# Patient Record
Sex: Female | Born: 1945
Health system: Southern US, Community
[De-identification: ages and names within clinical notes are randomized; demographics above are authoritative.]

## PROBLEM LIST (undated history)

## (undated) DIAGNOSIS — K219 Gastro-esophageal reflux disease without esophagitis: Secondary | ICD-10-CM

## (undated) DIAGNOSIS — M545 Low back pain, unspecified: Secondary | ICD-10-CM

## (undated) DIAGNOSIS — S92909A Unspecified fracture of unspecified foot, initial encounter for closed fracture: Secondary | ICD-10-CM

## (undated) DIAGNOSIS — M1990 Unspecified osteoarthritis, unspecified site: Secondary | ICD-10-CM

## (undated) DIAGNOSIS — H269 Unspecified cataract: Secondary | ICD-10-CM

## (undated) DIAGNOSIS — M8580 Other specified disorders of bone density and structure, unspecified site: Secondary | ICD-10-CM

## (undated) DIAGNOSIS — J449 Chronic obstructive pulmonary disease, unspecified: Secondary | ICD-10-CM

## (undated) DIAGNOSIS — G709 Myoneural disorder, unspecified: Secondary | ICD-10-CM

## (undated) DIAGNOSIS — G4730 Sleep apnea, unspecified: Secondary | ICD-10-CM

## (undated) DIAGNOSIS — N189 Chronic kidney disease, unspecified: Secondary | ICD-10-CM

## (undated) DIAGNOSIS — I6381 Other cerebral infarction due to occlusion or stenosis of small artery: Secondary | ICD-10-CM

## (undated) DIAGNOSIS — I10 Essential (primary) hypertension: Secondary | ICD-10-CM

## (undated) DIAGNOSIS — R32 Unspecified urinary incontinence: Secondary | ICD-10-CM

## (undated) DIAGNOSIS — D649 Anemia, unspecified: Secondary | ICD-10-CM

## (undated) DIAGNOSIS — M79606 Pain in leg, unspecified: Secondary | ICD-10-CM

## (undated) DIAGNOSIS — I639 Cerebral infarction, unspecified: Secondary | ICD-10-CM

## (undated) DIAGNOSIS — R112 Nausea with vomiting, unspecified: Secondary | ICD-10-CM

## (undated) DIAGNOSIS — E282 Polycystic ovarian syndrome: Secondary | ICD-10-CM

## (undated) DIAGNOSIS — E785 Hyperlipidemia, unspecified: Secondary | ICD-10-CM

## (undated) DIAGNOSIS — Z9289 Personal history of other medical treatment: Secondary | ICD-10-CM

## (undated) DIAGNOSIS — I951 Orthostatic hypotension: Secondary | ICD-10-CM

## (undated) DIAGNOSIS — Z87891 Personal history of nicotine dependence: Principal | ICD-10-CM

## (undated) DIAGNOSIS — Z9889 Other specified postprocedural states: Secondary | ICD-10-CM

## (undated) HISTORY — PX: OOPHORECTOMY: SHX86

## (undated) HISTORY — PX: KNEE SURGERY: SHX244

## (undated) HISTORY — DX: Other cerebral infarction due to occlusion or stenosis of small artery: I63.81

## (undated) HISTORY — PX: CARDIAC CATHETERIZATION: SHX172

## (undated) HISTORY — PX: FOOT SURGERY: SHX648

## (undated) HISTORY — DX: Other specified disorders of bone density and structure, unspecified site: M85.80

## (undated) HISTORY — DX: Essential (primary) hypertension: I10

## (undated) HISTORY — DX: Hyperlipidemia, unspecified: E78.5

## (undated) HISTORY — PX: CARPAL TUNNEL RELEASE: SHX101

## (undated) HISTORY — PX: ELBOW SURGERY: SHX618

## (undated) HISTORY — DX: Unspecified cataract: H26.9

## (undated) HISTORY — DX: Personal history of other medical treatment: Z92.89

## (undated) HISTORY — DX: Polycystic ovarian syndrome: E28.2

## (undated) HISTORY — PX: HAND SURGERY: SHX662

## (undated) HISTORY — PX: OTHER SURGICAL HISTORY: SHX169

## (undated) HISTORY — DX: Personal history of nicotine dependence: Z87.891

## (undated) HISTORY — DX: Unspecified fracture of unspecified foot, initial encounter for closed fracture: S92.909A

## (undated) HISTORY — PX: CATARACT EXTRACTION: SUR2

## (undated) HISTORY — PX: SHOULDER SURGERY: SHX246

---

## 1977-06-17 HISTORY — PX: VAGINAL HYSTERECTOMY: SUR661

## 1999-09-03 ENCOUNTER — Other Ambulatory Visit: Admission: RE | Admit: 1999-09-03 | Discharge: 1999-09-03 | Payer: Self-pay | Admitting: Obstetrics and Gynecology

## 2000-05-16 ENCOUNTER — Encounter (INDEPENDENT_AMBULATORY_CARE_PROVIDER_SITE_OTHER): Payer: Self-pay | Admitting: *Deleted

## 2000-05-16 ENCOUNTER — Ambulatory Visit (HOSPITAL_COMMUNITY): Admission: RE | Admit: 2000-05-16 | Discharge: 2000-05-16 | Payer: Self-pay | Admitting: Gastroenterology

## 2000-08-05 ENCOUNTER — Encounter: Payer: Self-pay | Admitting: Specialist

## 2000-08-05 ENCOUNTER — Ambulatory Visit (HOSPITAL_COMMUNITY): Admission: RE | Admit: 2000-08-05 | Discharge: 2000-08-05 | Payer: Self-pay | Admitting: *Deleted

## 2000-08-19 ENCOUNTER — Other Ambulatory Visit: Admission: RE | Admit: 2000-08-19 | Discharge: 2000-08-19 | Payer: Self-pay | Admitting: Obstetrics and Gynecology

## 2001-09-23 ENCOUNTER — Other Ambulatory Visit: Admission: RE | Admit: 2001-09-23 | Discharge: 2001-09-23 | Payer: Self-pay | Admitting: Obstetrics and Gynecology

## 2003-03-29 ENCOUNTER — Encounter (HOSPITAL_BASED_OUTPATIENT_CLINIC_OR_DEPARTMENT_OTHER): Admission: RE | Admit: 2003-03-29 | Discharge: 2003-06-27 | Payer: Self-pay | Admitting: Internal Medicine

## 2003-03-30 ENCOUNTER — Other Ambulatory Visit: Admission: RE | Admit: 2003-03-30 | Discharge: 2003-03-30 | Payer: Self-pay | Admitting: Obstetrics and Gynecology

## 2003-03-31 ENCOUNTER — Encounter: Admission: RE | Admit: 2003-03-31 | Discharge: 2003-03-31 | Payer: Self-pay | Admitting: Internal Medicine

## 2003-03-31 ENCOUNTER — Encounter (HOSPITAL_BASED_OUTPATIENT_CLINIC_OR_DEPARTMENT_OTHER): Payer: Self-pay | Admitting: Internal Medicine

## 2004-04-02 ENCOUNTER — Other Ambulatory Visit: Admission: RE | Admit: 2004-04-02 | Discharge: 2004-04-02 | Payer: Self-pay | Admitting: Obstetrics and Gynecology

## 2004-06-05 ENCOUNTER — Encounter: Payer: Self-pay | Admitting: Specialist

## 2004-06-17 ENCOUNTER — Encounter: Payer: Self-pay | Admitting: Specialist

## 2004-11-01 ENCOUNTER — Encounter (INDEPENDENT_AMBULATORY_CARE_PROVIDER_SITE_OTHER): Payer: Self-pay | Admitting: Specialist

## 2004-11-01 ENCOUNTER — Ambulatory Visit (HOSPITAL_COMMUNITY): Admission: RE | Admit: 2004-11-01 | Discharge: 2004-11-01 | Payer: Self-pay | Admitting: Gastroenterology

## 2005-04-04 ENCOUNTER — Other Ambulatory Visit: Admission: RE | Admit: 2005-04-04 | Discharge: 2005-04-04 | Payer: Self-pay | Admitting: Obstetrics and Gynecology

## 2005-09-17 ENCOUNTER — Emergency Department: Payer: Self-pay | Admitting: Emergency Medicine

## 2006-03-11 ENCOUNTER — Ambulatory Visit (HOSPITAL_COMMUNITY): Admission: RE | Admit: 2006-03-11 | Discharge: 2006-03-11 | Payer: Self-pay | Admitting: Endocrinology

## 2006-04-08 ENCOUNTER — Other Ambulatory Visit: Admission: RE | Admit: 2006-04-08 | Discharge: 2006-04-08 | Payer: Self-pay | Admitting: Obstetrics and Gynecology

## 2006-05-13 ENCOUNTER — Ambulatory Visit: Payer: Self-pay | Admitting: Vascular Surgery

## 2006-07-24 ENCOUNTER — Ambulatory Visit: Payer: Self-pay | Admitting: Internal Medicine

## 2006-09-04 ENCOUNTER — Ambulatory Visit: Payer: Self-pay | Admitting: Internal Medicine

## 2007-11-02 ENCOUNTER — Encounter: Admission: RE | Admit: 2007-11-02 | Discharge: 2008-01-31 | Payer: Self-pay | Admitting: Endocrinology

## 2008-03-21 ENCOUNTER — Ambulatory Visit: Payer: Self-pay | Admitting: Obstetrics and Gynecology

## 2008-03-21 ENCOUNTER — Other Ambulatory Visit: Admission: RE | Admit: 2008-03-21 | Discharge: 2008-03-21 | Payer: Self-pay | Admitting: Obstetrics and Gynecology

## 2008-03-21 ENCOUNTER — Encounter: Payer: Self-pay | Admitting: Obstetrics and Gynecology

## 2008-04-05 ENCOUNTER — Ambulatory Visit (HOSPITAL_COMMUNITY): Admission: RE | Admit: 2008-04-05 | Discharge: 2008-04-06 | Payer: Self-pay | Admitting: Orthopedic Surgery

## 2008-06-08 ENCOUNTER — Ambulatory Visit: Payer: Self-pay | Admitting: Obstetrics and Gynecology

## 2008-06-15 ENCOUNTER — Ambulatory Visit: Payer: Self-pay | Admitting: Obstetrics and Gynecology

## 2009-08-22 ENCOUNTER — Ambulatory Visit: Payer: Self-pay | Admitting: Obstetrics and Gynecology

## 2009-08-22 ENCOUNTER — Other Ambulatory Visit: Admission: RE | Admit: 2009-08-22 | Discharge: 2009-08-22 | Payer: Self-pay | Admitting: Obstetrics and Gynecology

## 2010-07-04 ENCOUNTER — Ambulatory Visit: Payer: Self-pay | Admitting: Obstetrics and Gynecology

## 2010-07-07 ENCOUNTER — Encounter: Payer: Self-pay | Admitting: Specialist

## 2010-08-06 ENCOUNTER — Ambulatory Visit (INDEPENDENT_AMBULATORY_CARE_PROVIDER_SITE_OTHER): Payer: 59 | Admitting: Obstetrics and Gynecology

## 2010-08-06 DIAGNOSIS — E559 Vitamin D deficiency, unspecified: Secondary | ICD-10-CM

## 2010-08-06 DIAGNOSIS — M810 Age-related osteoporosis without current pathological fracture: Secondary | ICD-10-CM

## 2010-08-20 ENCOUNTER — Ambulatory Visit: Payer: 59

## 2010-08-21 ENCOUNTER — Ambulatory Visit (INDEPENDENT_AMBULATORY_CARE_PROVIDER_SITE_OTHER): Payer: 59

## 2010-08-21 DIAGNOSIS — M810 Age-related osteoporosis without current pathological fracture: Secondary | ICD-10-CM

## 2010-10-30 NOTE — Op Note (Signed)
NAME:  Mckenzie Lawrence, Mckenzie Lawrence                ACCOUNT NO.:  [id number]   MEDICAL RECORD NO.:  [id number]          PATIENT TYPE:  AMB   LOCATION:  DAY                          FACILITY:  [facility]   PHYSICIAN:  Tarri Glenn, M.D.  DATE OF BIRTH:  [birth year]   DATE OF PROCEDURE:  04/05/2008  DATE OF DISCHARGE:                               OPERATIVE REPORT   PREOPERATIVE DIAGNOSES:  1. Chronic left ring trigger finger.  2. Claw toe deformities, right second, third and fourth toes.   POSTOPERATIVE DIAGNOSES:  1. Chronic left ring trigger finger.  2. Claw toe deformities, right second, third and fourth toes.   OPERATION:  1. Release left ring trigger finger.  2. Correction of claw toe deformities, right second, third and fourth      toes with transfer of the long flexor tendon to the extensor      mechanism.   SURGEON:  Tarri Glenn, M.D.   ASSISTANTElodia Florence. Vanita Ingles.   ANESTHESIA:  General.   INDICATIONS FOR PROCEDURE:  She is an insulin-dependent diabetic who is  very prone to infection.  She did not wish to have any pins put in her  toes to correct the claw toe deformities which would be the more usual  procedure for someone her age, but fortunately her toe deformities were  flexible and I felt that with her history of infections that this would  be the prudent way to manage things without any pins in place.  She also  at one point had desired to have Dupuytren nodule removed from her left  palm, but at the time of surgery today elected not to have this done.  She has also had an injection of her left ring trigger finger, but it is  still symptomatic.   PROCEDURE:  Prophylactic antibiotics, satisfied general anesthesia, we  first performed the left upper extremity surgery.  Pneumatic tourniquet  applied and left arm was Esmarch out non-sterilely and prepped from  midforearm to fingertips and draped in sterile field.  Time-out  performed.  A transverse incision was made  midway between the distal  palmar crease and the MP joint crease.  With retraction by Mr. Delorse Lek  the neurovascular bundles were retracted to either side and the tendon  identified and the tendon sheath opened superficially with a 15 knife  blade.  With small scissors and direct visualization I then released the  tendon sheath as far proximally as there was any compression and  distally I slipped the A1 pulley for 5 mm slightly more resecting a  small portion of the pulley and released the pulley until there was no  longer any compression on the flexor tendons.  The wound was irrigated  then with sterile saline.  Subcutaneous tissue was closed with 4-0  Vicryl and the skin with interrupted 4-0 nylon.  Betadine and Adaptic  dressing were applied.  Tourniquet was released.  We then separately re-  prepped and re-draped the right lower extremity first applying pneumatic  tourniquet and the leg Esmarch out non-sterilely and leg prepped from  mid calf to  toes with DuraPrep and draped in the sterile field.  Another  time-out was performed, working first and second toe.  I made a  transverse incision at roughly the MP joint of the second toe and with  blunt dissection identified the flexor tendons.  I then made a  transverse incision just passed the DIP joint and after identifying the  long flexor tendon which by its function I then cut it with 15 knife  blade and pulled it out through the more proximal incision with a curved  hemostat.  We then split the tendon and its anatomical fissure so that  there were two slips to it.  I then made a dorsal incision over the  proximal phalanx preserving the extensor mechanism lateral with the  extensor hood.  I then opened midway between the tendon and the inferior  portion and the bone and then placed a hemostat in each side adjacent to  the bone out through the flexor wound and brought the slip of flexor  tendon up dorsally.  We then performed the same  procedure for the each  of the other two toes.  I then repaired all the toes simultaneously with  the ankle dorsiflexed to neutral and the affected toe in neutral  position sewing on each side the flexor tendon slip to the extensor  mechanism with interrupted 4-0 Ethibond.  I tried to minimize the  bulkiness of the repair with 4-0 Vicryl flattening out the anastomosis.  The wounds were then irrigated with sterile saline and the skin and  subcutaneous tissue closed interrupted 4-0 nylon mattress sutures.  Betadine and Adaptic dry sterile dressing were applied with a well-  padded short-leg splint cast.  The tourniquet was released.  She  tolerated the procedure well and was taken to the recovery room in  satisfactory condition with no known complications.           ______________________________  Tarri Glenn, M.D.     JA/MEDQ  D:  04/05/2008  T:  04/05/2008  Job:  [id number]

## 2010-11-02 NOTE — Assessment & Plan Note (Signed)
[facility]                             PULMONARY OFFICE NOTE   ZYRIHANNA, SNOVER                       MRN:          [id number]  DATE:07/24/2006                            DOB:          10-09-1945    CHIEF COMPLAINT:  Bronchitis.   HISTORY:  This is a 65 year old white female with a tendency to bad  bronchitis every time she gets a head cold going into her chest and  previously has been diagnosed with bad bronchitis but no other  specific diagnosis made.  Initially in-between her episodes she can go 2  years without any difficulty including being able to tolerate aerobics.  However, her present exacerbation started around mid January and was  associated with increasing cough productive of yellow sputum, dyspnea,  fevers, sore throat, and chest tightness.  She was treated initially  with Avelox but still had symptoms, then was treated with 12 days of  prednisone and feels better except for hoarseness and generalized chest  discomfort posteriorly without pleuritic features.  She is short of  breath now with minimal activity and is not able to go back doing her  aerobics.  She denies, however, any pleuritic pain, fevers, chills,  sweats, orthopnea, PND, or nocturnal exacerbation of cough and wheezing  or dyspnea.  She does agree that she is better more so from prednisone,  actually, than taking antibiotics.   PAST MEDICAL HISTORY:  Hypertension.  Note she had previously  demonstrated an intolerance to ACE INHIBITORS and is no longer taking  them.  She also has a history of diabetes, hyperlipidemia, hysterectomy.   ALLERGIES:  She says she cannot tolerate CODEINE, CLONIDINE, SULFA  DRUGS, or IODINE.   MEDICATIONS:  Reviewed in detail on the worksheet column dated July 24, 2006.  Note that she does have both Nexium and Prilosec but does not  feel that she needs to use them regularly and they are too expensive  anyway.   SOCIAL HISTORY:  She  smokes two packs per day.  Denies any unusual  travel, pet or hobby exposure.   FAMILY HISTORY:  Positive for emphysema in her mother who was a smoker,  heart disease in her mother and brother.  Negative for atopy.   REVIEW OF SYSTEMS:  Taken in detail on the worksheet and significant for  the problems outlined above.   PHYSICAL EXAMINATION:  GENERAL:  This is an obese, ambulatory, very  hoarse white female in no acute distress.  She is afebrile with normal  vital signs.  HEENT:  Unremarkable, pharynx is clear.  There is no evidence of  excessive postnasal drainage or cobblestoning.  NECK:  Supple without cervical adenopathy or tenderness.  Trachea is  midline, no thyromegaly.  LUNG FIELDS:  Perfectly clear bilaterally to auscultation and  percussion.  HEART:  There is a regular rhythm without murmur, gallop, rub.  ABDOMEN:  Soft, benign.  EXTREMITIES:  Warm without calf tenderness, cyanosis, clubbing, or  edema.   Chest x-ray was reviewed from January 23 and is normal.   IMPRESSION:  Classic upper airway cough  syndrome that probably started  as a upper respiratory infection but rapidly deteriorated with cyclical  features and secondary reflux.  Apparently this patient has primary  reflux but does not feel she can afford to take PPI therapy regularly  and also has not been able to modify lifestyle or diet (including  continuing to smoke) so when she gets a cough for any reason she begins  a cyclical pattern with reflux playing a central role.   I therefore recommended Delsym or Mucinex DM twice daily to suppress  cyclical coughing, Zegerid 40 mg each and every bedtime (samples for 4  weeks given) and a 6-day course of prednisone (since she said this  helped the most and suggests an inflammatory component).   I emphasized to her that if ACE inhibitors fanned the fire, reflux  fuels the fire, but the real kindling for the fire is the fact that  she is still smoking and she  absolutely needs to make a commitment to  quit.   I would like to see the patient back in 4 weeks for a followup set of  PFTs with the next option in terms of the workup being proceeding with a  sinus CT scan and/or methacholine challenge test to define whether there  is another mechanism driving the cough.     Christena Deem. Melvyn Novas, MD, [facility]  Electronically Signed    MBW/MedQ  DD: 07/24/2006  DT: 07/25/2006  Job #: [id number]   cc:   Miguel Aschoff

## 2010-11-02 NOTE — Consult Note (Signed)
NAME:  SERI, TARPINIAN                          ACCOUNT NO.:  [id number]   MEDICAL RECORD NO.:  [id number]                   PATIENT TYPE:  REC   LOCATION:                                       FACILITY:   PHYSICIAN:  Orlando Penner. Sevier, M.D.              DATE OF BIRTH:  1945-10-12   DATE OF CONSULTATION:  03/31/2003  DATE OF DISCHARGE:                                   CONSULTATION   HISTORY:  This 65 year old white female is referred to the foot center by  Dr. Forde Dandy for pain in the instep of the right foot and on the lateral aspect  of the left foot and the left 4th toe.   The patient has type 2 diabetes, which has been diagnosed for about two  years, with the most recent hemoglobin A1c of 7.1%.  She has a natural pes  cavus configuration to her feet and has had some discomfort through the  years and, in fact, has previously been prescribed a custom insert, which  she has found useful.  This is several years old now, however.  She recently  has noted the appearance of a lump in the plantar fascia of the right foot  and some discomfort in that area.   More importantly, she has noted some pain laterally down the left foot for  several weeks and then the abrupt appearance of rather severe pain in the  4th toe and 4th metatarsal head area on that side.   She is here now for our evaluation and advise.   PAST MEDICAL HISTORY:  Past medical history is notable for and hypertension  and hyperlipidemia as well.   ALLERGIES:  She is allergic to CODEINE, SULFA and IODINE.   MEDICATIONS:  Her regular medications include Actos, Pravachol, Estrace,  Norvasc, Amaryl, Diovan, Glucophage and aspirin.   PHYSICAL EXAMINATION:  EXTREMITIES:  Examination today is limited to the  distal lower extremities.  The patient has no gross deformities, but it is  noted that her 5th metatarsals are somewhat short bilaterally with this  causing sort of a backset of the 5th toes, if you will.  She also has  slight  claw configuration of the toes with medial drift of the distal phalanges.  She has moderate pes cavus configuration of both feet as well.  Her skin  temperatures are normal and equal.  Pulses are everywhere adequate and  easily palpable.  Monofilament testing shows that she has protective  sensation throughout both feet.  She has minor callus formation at both  heels.  On the plantar aspect of the right foot, there is a palpable nodule  approximately 1.0 x 0.8 cm, not easily movable, which is clearly located in  the plantar fascia and probably on the tendon of the flexor hallucis.  On  the left foot, there is noted discoloration on the medial aspect of the 4th  toe  at the proximal phalangeal area and she is exquisitely tender in this  area.  Compaction of the 4th toe leads to considerable pain as well.  There  is no palpable abnormality along the lateral aspect of the foot.   IMPRESSION:  Suspected fracture of the left fourth toe, basis uncertain (it  is possible that she had a hairline fracture here present for some time that  has not been recognized -- a stress fracture-type situation -- which  accounted for the lateral pain in the foot and which was suddenly worsened  when perhaps an otherwise minor stumping of the toes that went unrecognized  as a substantial injury nonetheless caused further advance in this  fracture).   RECOMMENDATIONS:  1. The patient is given some instruction regarding foot care and diabetes by     video with nurse and physician reinforcement.  2. The patient was sent for an x-ray, which shows no gross fracture, but I     do believe there is perhaps a spiral hairline fracture through the     proximal phalanx of that 4th toe, which would certainly fit with her     clinical picture.  3. Accordingly, this forefoot is taped to include taping of the 3rd, 4th and     5th toes together and lateral taping to support the lateral metatarsal     head apparatuses.   She is then placed in a healing sandal and actually     seems to walk very well with this.  She refuses pain medication.  4. It is recommended to the patient that she would likely need either     injection or resection of the synovial sheath cyst in the right plantar     fascia, if it continues tender.  We recommended to her that this is     generally not done at this office because that bacteriologically, we are     not as clean as we prefer to be for that kind of an elective procedure.     She is accordingly referred to Dr. Weber Cooks, and since she is already     a patient in that practice, hopefully that appointment can be easily     established.  She indicates in passing that she also has some concern     about the tendency of her nails to ingrown and we have recommended she     consult Dr. Beola Cord about this as well.  5. Followup visit here will be on an as-needed (p.r.n.) basis.                                               Orlando Penner. London Pepper, M.D.    RES/MEDQ  D:  03/31/2003  T:  03/31/2003  Job:  [id number]   cc:   Annie Main A. Forde Dandy, M.D.  [address]  [place]  [place] [address]  Fax: [phone]

## 2010-11-02 NOTE — Op Note (Signed)
NAME:  [name], [name]                ACCOUNT NO.:  [id number]   MEDICAL RECORD NO.:  [id number]          PATIENT TYPE:  AMB   LOCATION:  ENDO                         FACILITY:  [facility]   PHYSICIAN:  Jeryl Columbia, M.D.    DATE OF BIRTH:  1946-04-07   DATE OF PROCEDURE:  11/01/2004  DATE OF DISCHARGE:                                 OPERATIVE REPORT   PROCEDURE PERFORMED:  Colonoscopy with polypectomy.   INDICATIONS:  Screening.   Consent was signed after risks, benefits, methods and options were  thoroughly discussed in the office on multiple occasions.   MEDICINES USED:  Demerol 70, Versed 10.   PROCEDURE:  Rectal inspection is pertinent for external hemorrhoids.  Small  digital exam was negative.  The video pediatric adjustable colonoscope was  inserted.  There was some difficulty due to a tortuous sigmoid.  Once  through this area with abdominal pressure, was able to be advanced to the  cecum.  On insertion, a proximal transverse small polyp was seen but no  other abnormalities.  The cecum was identified via the appendiceal orifice  and the ileocecal valve.  The scope was slowly withdrawn.  The prep was  fairly adequate.  It did require some washing and suctioning for adequate  visualization.  She had some difficulty holding air but otherwise an  adequate look was obtained.  As the scope was slowly withdrawn back from the  cecum, in the proximal level of the hepatic flexure, a tiny questionable  polyp was seen and was hot biopsied x1.  The polyp seen on insertion was  then found, snared x2.  Two pieces were removed.  The polyps were suctioned  through the scope and collected in the trap and put in the same container.  Possibly, there was a little residual polypoid tissue which was hot biopsied  x1.  The scope was further withdrawn.  A few other tiny transverse and  proximal descending polyps were seen and were hot biopsied as well and all  put in the same container.  As the scope  was withdrawn around the sigmoid, a  few tiny hyperplastic-appearing polyps were seen in both the rectum and  distal sigmoid and they were hot and cold biopsied and put in a second  container.  No other abnormalities were seen as we slowly withdrew back to  the rectum. Anorectal pull through in retroflexion confirmed some small  hemorrhoids.  The scope was straightened and re-advanced a short ways up the  left side of the colon.  The air was suctioned.  The scope removed.  The  patient tolerated the procedure well.  There were no obvious immediate  complications.   ENDOSCOPIC DIAGNOSES:  1.  Internal and external hemorrhoids.  2.  Few tiny rectal distal sigmoid hyperplastic-appearing polyps hot and      cold biopsied.  3.  Few tiny hepatic flexure, transverse and descending hot biopsied.  4.  Proximal transverse small polyp was snared x2 and hot biopsied at the      base.  5.  Otherwise within normal limits to the  cecum.   PLAN:  Await pathology to determine future colonic screening.  Continue work  up with an esophagogastroduodenoscopy and dilatation.      MEM/MEDQ  D:  11/01/2004  T:  11/01/2004  Job:  [id number]

## 2010-11-02 NOTE — Op Note (Signed)
NAME:  Mckenzie Lawrence, Mckenzie Lawrence                ACCOUNT NO.:  [id number]   MEDICAL RECORD NO.:  [id number]          PATIENT TYPE:  AMB   LOCATION:  ENDO                         FACILITY:  [facility]   PHYSICIAN:  Jeryl Columbia, M.D.    DATE OF BIRTH:  08/01/1945   DATE OF PROCEDURE:  11/01/2004  DATE OF DISCHARGE:                                 OPERATIVE REPORT   PROCEDURE:  Esophagogastroduodenoscopy, esophageal dilatation.   INDICATION:  Dysphagia.  Consent was signed after risks, benefits, methods,  and options were thoroughly discussed in the office.  Additional medicines  was 30 of Demerol only.   PROCEDURE:  The video endoscope was inserted via direct vision.  The  esophagus was normal.  She did have a small hiatal hernia.  The scope was  inserted into the stomach and some water was suctioned.  The scope was then  slowly withdrawn back to about 20 cm again confirming normal esophageal  findings.  The scope was then advanced through a normal antrum, normal  pylorus, into a normal duodenal bulb, and around the celiac to the normal  second portion of the duodenum.  The scope was withdrawn back to the bulb  and a good look there ruled out ulcers in all locations.  The scope was  withdrawn back to the stomach and retroflexed.  The cardia, fundus,  angularis, lesser and greater curve were normal except for some mild to  moderate gastritis.  Was evaluated on retroflexed and then straight  visualization.  She did have some difficulty holding air, but no other  abnormalities were seen.  The Savary wire was then advanced into the antrum.  Fluoroscopy confirmed the proper position and the J-loop of the wire was  confirmed.  The scope was withdrawn under fluoroscopy guidance making sure  to keep the wire in the proper location.  Since no obvious stricture was  seen we went ahead and proceeded with Savary dilatation of 16 mm only.  There was no resistance in passing the dilator.  Once the dilator was  confirmed in the stomach the wire was withdrawn back into the dilator.  Both  were removed in tandem.  The procedure was terminated at this junction.  There was no heme on the dilator.  The patient tolerated the procedure well.  There was no obvious immediate complications.   ENDOSCOPIC DIAGNOSIS:  1.  Small hiatal hernia.  2. Mild to moderate gastritis throughout.  3.      Otherwise normal esophagogastroduodenoscopy therapy Savary dilatation      under fluoroscopy to 16 mm.   PLAN:  See how the dilation works, continue pump inhibitors.  If dysphagia  continues, probably proceed with a barium swallow, barium tablet, and  possibly even an ENT consult.  Will see back p.r.n. or in two months.      MEM/MEDQ  D:  11/01/2004  T:  11/01/2004  Job:  [id number]   cc:   Annie Main A. Forde Dandy, M.D.  [address]  [place]  [place] [address]  Fax: [phone]

## 2010-11-02 NOTE — Assessment & Plan Note (Signed)
[facility]                             PULMONARY OFFICE NOTE   NAME:Mckenzie Lawrence, Mckenzie Lawrence                       MRN:          [id number]  DATE:09/04/2006                            DOB:          June 12, 1946    FINAL FOLLOW-UP OFFICE VISIT:   HISTORY:  A 65 year old white female active smoker, returns for PFTs as  requested stating she is having no difficulty with dyspnea or cough but  is continuing to smoke up to two packs per day.  I had treated her for  upper airway cough syndrome that I thought was probably related to  reflux and she stated as long as she took Zegerid, she was doing fine  but seems a little bit worse off of it in terms of cough.  Does not  think Nexium works as well but is not sure Zegerid is covered by her  insurance.  She denies any pleuritic or exertional chest pain, fever,  chills, sweats, orthopnea, PND, leg swelling or significant sputum  production or nocturnal disturbance.   PHYSICAL EXAMINATION:  GENERAL APPEARANCE:  She is a ambulatory, obese  white female with a gruff sounding smokers type voice.  VITAL SIGNS:  She is afebrile with normal vital signs with BP 126/80  HEENT:  Unremarkable.  Oropharynx clear.  LUNGS:  Clear to auscultation and percussion bilaterally.  CARDIOVASCULAR:  Regular rhythm without murmurs, rubs, or gallops.  ABDOMEN:  Soft and benign.  EXTREMITIES:  Warm without calf tenderness, clubbing, cyanosis, or  edema.   PFTs were reviewed from today and revealed an FEV1 of 83% predicted with  a ratio of 72%.  She has minimal inspiratory truncation and a diffusion  capacity that corrects to 90%.   Chest x-ray reviewed from July 09, 2006, is normal.   IMPRESSION:  This patient has minimum chronic obstructive pulmonary  disease by pulmonary function tests and the most striking abnormality  seen is actually inspiratory truncation typical of vocal cord  dysfunction that seems markedly improved with proton  pump inhibitor  therapy.  This is probably why she could not tolerate ACE inhibitors  which I would recommend she continue off of.  Note that her blood  pressure is under excellent control on her present regimen with Diovan.   The key to this patient's long-term health of course is to stop smoking  which already acknowledges but says she is working on.  Pulmonary  follow-up can be on a p.r.n. basis.   In terms of being more aggressive to try to stop her from having  exacerbations I really do not believe she is a good candidate for  anything but perhaps Combivent on a p.r.n. basis based on the severity  of her disease, which is only very mild.   Pulmonary follow-up can therefore be on a p.r.n. basis.   I did give her samples of the Zegerid and told her that refills would be  through Dr. Alben Spittle office for the Zegerid or whatever PPI he chooses  including whether or not to refer her to ENT and/or GI if she continues  to  have hoarseness.  She assures me this comes and goes and depends on  how much she smokes and she does have the typical gruff voice of a  smoker so I am going to defer to Dr. Rosanna Randy whether further evaluation  is needed and see her back here on a p.r.n. basis for pulmonary  problems.     Christena Deem. Melvyn Novas, MD, [facility]  Electronically Signed    MBW/MedQ  DD: 09/04/2006  DT: 09/04/2006  Job #: [id number]   cc:   Miguel Aschoff

## 2011-02-19 ENCOUNTER — Other Ambulatory Visit: Payer: Self-pay | Admitting: *Deleted

## 2011-02-19 MED ORDER — DENOSUMAB 60 MG/ML ~~LOC~~ SOLN
60.0000 mg | Freq: Once | SUBCUTANEOUS | Status: AC
  Administered 2011-02-21: 60 mg via SUBCUTANEOUS

## 2011-02-21 ENCOUNTER — Ambulatory Visit (INDEPENDENT_AMBULATORY_CARE_PROVIDER_SITE_OTHER): Payer: 59 | Admitting: Anesthesiology

## 2011-02-21 DIAGNOSIS — M810 Age-related osteoporosis without current pathological fracture: Secondary | ICD-10-CM

## 2011-03-19 LAB — BASIC METABOLIC PANEL
BUN: 11
Calcium: 9.1
GFR calc non Af Amer: >60
Glucose, Bld: 169 — ABNORMAL HIGH
Sodium: 136

## 2011-03-19 LAB — GLUCOSE, CAPILLARY
Glucose-Capillary: 142 — ABNORMAL HIGH
Glucose-Capillary: 281 — ABNORMAL HIGH
Glucose-Capillary: 408 — ABNORMAL HIGH

## 2011-03-19 LAB — GLUCOSE, RANDOM: Glucose, Bld: 404 — ABNORMAL HIGH

## 2011-03-19 LAB — HEMOGLOBIN AND HEMATOCRIT, BLOOD: Hemoglobin: 13.8

## 2011-05-13 ENCOUNTER — Other Ambulatory Visit: Payer: Self-pay | Admitting: Obstetrics and Gynecology

## 2011-05-13 NOTE — Telephone Encounter (Signed)
DENIED RX AND CALLED TO PHARMACY SINCE E-SCRIPTS NOT WORKING TODAY.

## 2011-05-16 ENCOUNTER — Ambulatory Visit: Payer: Self-pay | Admitting: Family Medicine

## 2011-08-09 ENCOUNTER — Other Ambulatory Visit: Payer: Self-pay | Admitting: Obstetrics and Gynecology

## 2011-08-12 ENCOUNTER — Other Ambulatory Visit: Payer: Self-pay | Admitting: Obstetrics and Gynecology

## 2011-08-13 ENCOUNTER — Telehealth: Payer: Self-pay | Admitting: *Deleted

## 2011-08-13 MED ORDER — ESTRADIOL 1 MG PO TABS: 1.0000 mg | ORAL_TABLET | Freq: Every day | ORAL | Status: DC

## 2011-08-13 NOTE — Telephone Encounter (Signed)
Pt called wanting to know why her rx for estradiol 1 mg was denied. Pt informed overdue for annual. Pt said that Dr.G told pt aex every 2 yrs. I told pt this would be year two, pt transferred to appointment desk to schedule. 30 day supply for estradiol sent to pharmacy.

## 2011-08-14 ENCOUNTER — Telehealth: Payer: Self-pay | Admitting: *Deleted

## 2011-08-14 NOTE — Telephone Encounter (Signed)
Message not needed. °

## 2011-08-26 ENCOUNTER — Encounter: Payer: Self-pay | Admitting: Gynecology

## 2011-08-26 DIAGNOSIS — E282 Polycystic ovarian syndrome: Secondary | ICD-10-CM | POA: Insufficient documentation

## 2011-08-26 DIAGNOSIS — E1169 Type 2 diabetes mellitus with other specified complication: Secondary | ICD-10-CM | POA: Insufficient documentation

## 2011-08-26 DIAGNOSIS — E119 Type 2 diabetes mellitus without complications: Secondary | ICD-10-CM | POA: Insufficient documentation

## 2011-08-26 DIAGNOSIS — M8580 Other specified disorders of bone density and structure, unspecified site: Secondary | ICD-10-CM | POA: Insufficient documentation

## 2011-09-03 ENCOUNTER — Encounter: Payer: Self-pay | Admitting: Obstetrics and Gynecology

## 2011-09-03 ENCOUNTER — Ambulatory Visit (INDEPENDENT_AMBULATORY_CARE_PROVIDER_SITE_OTHER): Payer: 59 | Admitting: Obstetrics and Gynecology

## 2011-09-03 VITALS — BP 124/76 | Ht 67.0 in | Wt 201.0 lb

## 2011-09-03 DIAGNOSIS — Z01419 Encounter for gynecological examination (general) (routine) without abnormal findings: Secondary | ICD-10-CM

## 2011-09-03 DIAGNOSIS — H269 Unspecified cataract: Secondary | ICD-10-CM | POA: Insufficient documentation

## 2011-09-03 DIAGNOSIS — I10 Essential (primary) hypertension: Secondary | ICD-10-CM | POA: Insufficient documentation

## 2011-09-03 DIAGNOSIS — M810 Age-related osteoporosis without current pathological fracture: Secondary | ICD-10-CM

## 2011-09-03 DIAGNOSIS — S92909A Unspecified fracture of unspecified foot, initial encounter for closed fracture: Secondary | ICD-10-CM | POA: Insufficient documentation

## 2011-09-03 DIAGNOSIS — E7800 Pure hypercholesterolemia, unspecified: Secondary | ICD-10-CM | POA: Insufficient documentation

## 2011-09-03 MED ORDER — DENOSUMAB 60 MG/ML ~~LOC~~ SOLN
60.0000 mg | Freq: Once | SUBCUTANEOUS | Status: AC
  Administered 2011-09-03: 60 mg via SUBCUTANEOUS

## 2011-09-03 MED ORDER — ESTRADIOL 1 MG PO TABS: 1.0000 mg | ORAL_TABLET | Freq: Every day | ORAL | Status: DC

## 2011-09-03 NOTE — Patient Instructions (Signed)
Get me a copy of last mammogram. Followup bone density in January 2014.

## 2011-09-03 NOTE — Progress Notes (Signed)
Patient came to see me today for her annual GYN exam. She is up-to-date on mammograms but we did not get her last one. She will get [name] a copy. She has osteopenia and we gave her her third Prolia shot today. She has an elevated FRAX risk. She does well on it without side effects. She's had no fractures. She takes calcium and vitamin D. She is having no vaginal bleeding. She is having no pelvic pain. She remains on oral estradiol for her control vasomotor symptoms. She is having no other GYN problems. She does her lab through PCP. She thinks she's developed a small boil on her left buttock.  HEENT: Within normal limits.Caryn Bee presentNeck: No masses. Supraclavicular lymph nodes: Not enlarged. Breasts: Examined in both sitting and lying position. Symmetrical without skin changes or masses. Abdomen: Soft no masses guarding or rebound. No hernias. Pelvic: External within normal limits. BUS within normal limits. Vaginal examination shows good estrogen effect, no cystocele enterocele or rectocele. Cervix and uterus absent. Adnexa within normal limits. Rectovaginal confirmatory. Extremities within normal limits. Small boil of left buttock. No cellulitis.  Assessment: #1. Menopausal symptoms #2. Osteopenia #3. Development of small boil of left buttock.(does not look infected)  Plan: Discussed transdermal estrogen. Patient declined  because of skin. Continue oral estradiol 1 mg daily. Prolia injection given. Plan mammogram at appropriate time. Plan follow up bone density January 2014. Warm soaks to boil on left buttock. Call prn for antibiotics.

## 2011-09-04 LAB — URINALYSIS W MICROSCOPIC + REFLEX CULTURE
Bacteria, UA: NONE SEEN
Bilirubin Urine: NEGATIVE
Hgb urine dipstick: NEGATIVE
Ketones, ur: NEGATIVE mg/dL
Protein, ur: NEGATIVE mg/dL
Urobilinogen, UA: 0.2 mg/dL (ref 0.0–1.0)

## 2011-09-20 ENCOUNTER — Encounter: Payer: Self-pay | Admitting: Obstetrics and Gynecology

## 2011-12-01 ENCOUNTER — Emergency Department: Payer: Self-pay | Admitting: Emergency Medicine

## 2011-12-01 LAB — CBC
HCT: 40.9 % (ref 35.0–47.0)
HGB: 13.6 g/dL (ref 12.0–16.0)
MCH: 31.6 pg (ref 26.0–34.0)
MCHC: 33.1 g/dL (ref 32.0–36.0)
MCV: 95 fL (ref 80–100)
Platelet: 299 10*3/uL (ref 150–440)
RBC: 4.29 10*6/uL (ref 3.80–5.20)
RDW: 13.1 % (ref 11.5–14.5)
WBC: 14.3 10*3/uL — ABNORMAL HIGH (ref 3.6–11.0)

## 2011-12-01 LAB — COMPREHENSIVE METABOLIC PANEL
Chloride: 104 mmol/L (ref 98–107)
Co2: 23 mmol/L (ref 21–32)
Creatinine: 1.15 mg/dL (ref 0.60–1.30)
EGFR (Non-African Amer.): 50 — ABNORMAL LOW
Potassium: 4.3 mmol/L (ref 3.5–5.1)
SGPT (ALT): 9 U/L — ABNORMAL LOW
Sodium: 137 mmol/L (ref 136–145)

## 2011-12-01 LAB — TROPONIN I: Troponin-I: <0.02 ng/mL

## 2011-12-04 ENCOUNTER — Ambulatory Visit (INDEPENDENT_AMBULATORY_CARE_PROVIDER_SITE_OTHER): Payer: 59 | Admitting: Cardiology

## 2011-12-04 ENCOUNTER — Encounter: Payer: Self-pay | Admitting: Cardiology

## 2011-12-04 VITALS — BP 123/65 | HR 102 | Ht 67.0 in | Wt 206.4 lb

## 2011-12-04 DIAGNOSIS — I10 Essential (primary) hypertension: Secondary | ICD-10-CM

## 2011-12-04 DIAGNOSIS — F17200 Nicotine dependence, unspecified, uncomplicated: Secondary | ICD-10-CM

## 2011-12-04 DIAGNOSIS — Z720 Tobacco use: Secondary | ICD-10-CM

## 2011-12-04 DIAGNOSIS — R55 Syncope and collapse: Secondary | ICD-10-CM | POA: Insufficient documentation

## 2011-12-04 NOTE — Progress Notes (Signed)
HPI The patient presents after a syncopal episode. She has no prior cardiac history. She reports having a catheterization years ago that was apparently normal. She does occasionally have some dizziness with standing and notices this when doing some of her exercise regimen. On Sunday she had an episode of syncope. She said she was sitting on the toilet. She had a bit of an upset stomach. She stood up. She felt presyncopal and then acutely diaphoretic. He did not feel palpitations. She's not having any chest pressure, neck or arm discomfort. She went to an outside emergency room but I don't have these records. She has since had no presyncope or syncope. She has not had a syncopal episode in the past. She is otherwise active. She denies any shortness of breath, PND or orthopnea.  Allergies  Allergen Reactions  . Codeine   . Iodine   . Sulfa Antibiotics     Current Outpatient Prescriptions  Medication Sig Dispense Refill  . AMLODIPINE BESYLATE PO Take 5 mg by mouth daily.       Marland Kitchen aspirin 81 MG tablet Take 81 mg by mouth daily.      Marland Kitchen denosumab (PROLIA) 60 MG/ML SOLN injection Inject 60 mg into the skin every 6 (six) months. Administer in upper arm, thigh, or abdomen      . estradiol (ESTRACE) 1 MG tablet Take 1 tablet (1 mg total) by mouth daily.  90 tablet  4  . Insulin Detemir (LEVEMIR [place]) Inject 34 Units into the skin daily.       . Insulin Lispro, Human, (HUMALOG [place]) Inject into the skin as needed.       Marland Kitchen lisinopril (PRINIVIL,ZESTRIL) 40 MG tablet Take 40 mg by mouth daily.      . metFORMIN (GLUCOPHAGE) 1000 MG tablet Take 1,000 mg by mouth 2 (two) times daily with a meal.      . omeprazole (PRILOSEC) 20 MG capsule Take 20 mg by mouth daily.      . pravastatin (PRAVACHOL) 40 MG tablet Take 40 mg by mouth daily.      Marland Kitchen zolpidem (AMBIEN) 10 MG tablet Take 10 mg by mouth at bedtime as needed.        Past Medical History  Diagnosis Date  . PCOS (polycystic ovarian syndrome)   . Diabetes  mellitus   . Osteopenia   . Elevated cholesterol   . Cataracts, bilateral   . Hypertension   . Broken foot     Past Surgical History  Procedure Date  . Vaginal hysterectomy 1979  . Knee surgery   . Hand surgery   . Labial abscess   . Oophorectomy     BSO  . Pubo vag sling   . Groin abscess   . Foot surgery   . Shoulder surgery     Family History  Problem Relation Age of Onset  . Hypertension Mother   . Heart disease Mother   . Diabetes Father   . Diabetes Sister   . Hypertension Sister   . Diabetes Brother   . Hypertension Brother   . Heart disease Brother   . Cancer Sister     Multiple myloma    History   Social History  . Marital Status: Married    Spouse Name: N/A    Number of Children: N/A  . Years of Education: N/A   Occupational History  . Not on file.   Social History Main Topics  . Smoking status: Current Everyday Smoker -- 1.5  packs/day    Types: Cigarettes  . Smokeless tobacco: Not on file  . Alcohol Use: No  . Drug Use: Not on file  . Sexually Active: Yes    Birth Control/ Protection: Surgical   Other Topics Concern  . Not on file   Social History Narrative  . No narrative on file    ROS: As stated in the HPI and negative for all other systems.  PHYSICAL EXAM BP 129/72  Pulse 99  Ht 5\' 7"  (1.702 m)  Wt 206 lb 6.4 oz (93.622 kg)  BMI 32.33 kg/m2 GENERAL:  Well appearing HEENT:  Pupils equal round and reactive, fundi not visualized, oral mucosa unremarkable NECK:  No jugular venous distention, waveform within normal limits, carotid upstroke brisk and symmetric, no bruits, no thyromegaly LYMPHATICS:  No cervical, inguinal adenopathy LUNGS:  Clear to auscultation bilaterally BACK:  No CVA tenderness CHEST:  Unremarkable HEART:  PMI not displaced or sustained,S1 and S2 within normal limits, no S3, no S4, no clicks, no rubs, no murmurs ABD:  Flat, positive bowel sounds normal in frequency in pitch, no bruits, no rebound, no guarding,  no midline pulsatile mass, no hepatomegaly, no splenomegaly EXT:  2 plus pulses throughout, no edema, no cyanosis no clubbing SKIN:  No rashes no nodules NEURO:  Cranial nerves II through XII grossly intact, motor grossly intact throughout PSYCH:  Cognitively intact, oriented to person place and time  EKG:  Sinus rhythm, rate 91, axis rightward, intervals within normal limits, no acute ST-T wave changes. 12/04/2011  ASSESSMENT AND PLAN

## 2011-12-04 NOTE — Patient Instructions (Addendum)
The current medical regimen is effective;  continue present plan and medications.  Your physician has requested that you have an exercise tolerance test. For further information please visit [email or web address]. Please also follow instruction sheet, as given.

## 2011-12-04 NOTE — Assessment & Plan Note (Signed)
I suspect the patient had a vagal episode. She was slightly orthostatic from the office today. We did discuss avoidance of syncopal episodes. At this point I don't think further testing such as tilt table or an event monitor would be warranted unless she has further events.

## 2011-12-04 NOTE — Assessment & Plan Note (Signed)
She has tried multiple things to quit smoking. We will continue to discuss this. Of note because of her risk factors and a syncopal episode would like to screen her with an exercise treadmill test.  I will bring the patient back for a POET (Plain Old Exercise Test). This will allow me to screen for obstructive coronary disease, risk stratify and very importantly provide a prescription for exercise.

## 2011-12-04 NOTE — Assessment & Plan Note (Signed)
Though she has a slight orthostatic hypotension I would prefer not to decrease her medications as I suspect lying hypertension would be an issue if these medicines were stopped.

## 2011-12-06 ENCOUNTER — Ambulatory Visit (HOSPITAL_COMMUNITY)
Admission: RE | Admit: 2011-12-06 | Discharge: 2011-12-06 | Disposition: A | Discharge status: Home / Self Care | Payer: 59 | Source: Ambulatory Visit | Attending: Cardiology | Admitting: Cardiology

## 2011-12-06 ENCOUNTER — Ambulatory Visit (INDEPENDENT_AMBULATORY_CARE_PROVIDER_SITE_OTHER): Payer: 59 | Admitting: Cardiology

## 2011-12-06 ENCOUNTER — Encounter: Payer: Self-pay | Admitting: Cardiology

## 2011-12-06 DIAGNOSIS — R059 Cough, unspecified: Secondary | ICD-10-CM

## 2011-12-06 DIAGNOSIS — R55 Syncope and collapse: Secondary | ICD-10-CM

## 2011-12-06 DIAGNOSIS — R05 Cough: Secondary | ICD-10-CM

## 2011-12-06 DIAGNOSIS — F17200 Nicotine dependence, unspecified, uncomplicated: Secondary | ICD-10-CM | POA: Insufficient documentation

## 2011-12-06 NOTE — Procedures (Signed)
Exercise Treadmill Test  Pre-Exercise Testing Evaluation Rhythm: normal sinus  Rate: 88   PR:  .15 QRS:  .10  QT:  .35 QTc: .42     Test  Exercise Tolerance Test Ordering MD: Marijo File, MD  Interpreting MD: Marijo File, MD  Unique Test No: 1  Treadmill:  1  Indication for ETT: Syncope  Contraindication to ETT: No   Stress Modality: exercise - treadmill  Cardiac Imaging Performed: non   Protocol: standard Bruce - maximal  Max BP:  119/*61**  Max MPHR (bpm):  156 85% MPR (bpm):  132  MPHR obtained (bpm): 140 % MPHR obtained:  90  Reached 85% MPHR (min:sec):  5:00 Total Exercise Time (min-sec):  5:44  Workload in METS:  7.0 Borg Scale: 16  Reason ETT Terminated:  leg cramps    ST Segment Analysis At Rest: normal ST segments - no evidence of significant ST depression With Exercise: no evidence of significant ST depression  Other Information Arrhythmia:  No Angina during ETT:  absent (0) Quality of ETT:  diagnostic  ETT Interpretation:  normal - no evidence of ischemia by ST analysis  Comments: The patient had an low exercise tolerance.  There was no chest pain.  There was an appropriate level of dyspnea.  There were no arrhythmias, a normal heart rate response and normal BP response.  There were no ischemic ST T wave changes.  Recommendations: Negative adequate ETT.  No further testing is indicated.  Based on the above I gave the patient a prescription for exercise.  Of note the patient does have a cough and we will order a chest Xray.  She does smoke.

## 2011-12-09 ENCOUNTER — Encounter: Payer: Self-pay | Admitting: Cardiology

## 2011-12-12 ENCOUNTER — Ambulatory Visit: Payer: 59 | Admitting: Cardiology

## 2012-01-03 ENCOUNTER — Ambulatory Visit: Payer: Self-pay | Admitting: Family Medicine

## 2012-02-27 ENCOUNTER — Telehealth: Payer: Self-pay | Admitting: *Deleted

## 2012-02-27 NOTE — Telephone Encounter (Signed)
Her insurance was verified earlier this year and good for a calendar year. NO insurance change, therefore coverage 100%. KW

## 2012-02-27 NOTE — Telephone Encounter (Signed)
Pt is ready for her next Prolia. Her last Calcium was drawn by her PCP in June 2013, nml at 8.5. She will come the [facility] 03/09/12. KW

## 2012-02-28 NOTE — Telephone Encounter (Signed)
Informed and apt set up for 03/09/12

## 2012-03-09 ENCOUNTER — Ambulatory Visit (INDEPENDENT_AMBULATORY_CARE_PROVIDER_SITE_OTHER): Payer: 59 | Admitting: Gynecology

## 2012-03-09 DIAGNOSIS — M810 Age-related osteoporosis without current pathological fracture: Secondary | ICD-10-CM

## 2012-03-09 MED ORDER — DENOSUMAB 60 MG/ML ~~LOC~~ SOLN
60.0000 mg | Freq: Once | SUBCUTANEOUS | Status: AC
  Administered 2012-03-09: 60 mg via SUBCUTANEOUS

## 2012-06-16 ENCOUNTER — Ambulatory Visit: Payer: Self-pay | Admitting: Family Medicine

## 2012-07-22 ENCOUNTER — Ambulatory Visit: Payer: Self-pay | Admitting: Obstetrics and Gynecology

## 2012-07-23 ENCOUNTER — Encounter: Payer: Self-pay | Admitting: Gynecology

## 2012-08-07 ENCOUNTER — Telehealth: Payer: Self-pay

## 2012-08-07 NOTE — Telephone Encounter (Signed)
Patient called stating she was told once she got her Medicare card to send copy to [facility] so arrangements could be made for Prolia Inj.  Patient has her card now and it goes into effect on August 16, 2012.   I provided her with fax # and she will send it to Kari's attn with note about arranging Prolia.  She is leaving to be out of town for two weeks as of this coming weekend.

## 2012-08-11 ENCOUNTER — Telehealth: Payer: Self-pay | Admitting: *Deleted

## 2012-08-11 NOTE — Telephone Encounter (Signed)
I received a fax with patients new insurance card. I tried to call her back to let her know I received it but could not get an answer at her home  And the cell listed was the wrong number. I will send a benefit request off on March 3rd and let the patient know when I receive the benefits.

## 2012-08-18 NOTE — Telephone Encounter (Signed)
LM on cell for pt to call back. I couldn't read all numbers to send request for Prolia benefits. I have a faxed copy of the card. I need Provider phone number on back of card and ID # verified. KW

## 2012-08-28 NOTE — Telephone Encounter (Signed)
Pt called me back and then I called her back and shes out of town til Monday. LM with her husband to call me back KW

## 2012-09-01 NOTE — Telephone Encounter (Signed)
Talked to the patient received her ID # and provider line # and sent off for Prolia benefits. Will let the patient know when I hear back KW

## 2012-09-03 NOTE — Telephone Encounter (Signed)
Pt informed 20% prolia is pts responsibility. Up to $3400 out of pocket max. Pt will come 09/08/12 4pm

## 2012-09-08 ENCOUNTER — Ambulatory Visit (INDEPENDENT_AMBULATORY_CARE_PROVIDER_SITE_OTHER): Payer: Medicare Other | Admitting: Gynecology

## 2012-09-08 DIAGNOSIS — M810 Age-related osteoporosis without current pathological fracture: Secondary | ICD-10-CM

## 2012-09-08 MED ORDER — DENOSUMAB 60 MG/ML ~~LOC~~ SOLN
60.0000 mg | Freq: Once | SUBCUTANEOUS | Status: AC
  Administered 2012-09-08: 60 mg via SUBCUTANEOUS

## 2012-09-10 ENCOUNTER — Other Ambulatory Visit: Payer: Self-pay | Admitting: Obstetrics and Gynecology

## 2012-09-24 ENCOUNTER — Ambulatory Visit (INDEPENDENT_AMBULATORY_CARE_PROVIDER_SITE_OTHER): Payer: Medicare Other | Admitting: Gynecology

## 2012-09-24 ENCOUNTER — Encounter: Payer: Self-pay | Admitting: Gynecology

## 2012-09-24 VITALS — BP 120/74 | Ht 66.0 in | Wt 200.0 lb

## 2012-09-24 DIAGNOSIS — Z79890 Hormone replacement therapy (postmenopausal): Secondary | ICD-10-CM

## 2012-09-24 DIAGNOSIS — N952 Postmenopausal atrophic vaginitis: Secondary | ICD-10-CM

## 2012-09-24 NOTE — Progress Notes (Signed)
Christobel Floriano Karis October 07, 1945 [id number]        67 y.o.  G0P0 for follow up exam.  Former patient of Dr. Cherylann Banas. Several issues that are below  Past medical history,surgical history, medications, allergies, family history and social history were all reviewed and documented in the EPIC chart. ROS:  Was performed and pertinent positives and negatives are included in the history.  Exam: Kim assistant Filed Vitals:   09/24/12 1400  BP: 120/74  Height: 5\' 6"  (1.676 m)  Weight: 200 lb (90.719 kg)   General appearance  Normal Skin grossly normal Head/Neck normal with no cervical or supraclavicular adenopathy thyroid normal Lungs  clear Cardiac RR, without RMG Abdominal  soft, nontender, without masses, organomegaly or hernia Breasts  examined lying and sitting without masses, retractions, discharge or axillary adenopathy. Pelvic  Ext/BUS/vagina  normal with atrophic changes   Adnexa  Without masses or tenderness    Anus and perineum  normal   Rectovaginal  normal sphincter tone without palpated masses or tenderness.    Assessment/Plan:  67 y.o. G0P0 female for follow up exam.   1. ERT.  Patient on estradiol 1 mg daily. Has tried weaning several times with significant symptoms such as skin and hair changes and hot flashes. Does smoke heavily. Dr. Cherylann Banas in the past and today I discussed the risks to include stroke heart attack DVT with probable increased risk given her medical history of smoking diabetes hypertension possible increased risk of breast cancer. Advantage of transdermal versus oral. After lengthy discussion the patient clearly understands the risks and issues and wants to continue I refilled her times a year. I did ask her to try to decrease to 0.5 mg and see she does with that. 2. Postmenopausal atrophic changes. Asymptomatic. We'll continue to monitor. 3. Mammography 08/2011.  Request slip given for her to schedule and she agrees to do so. SBE monthly  reviewed. 4. Osteopenia/porosis.  Recent DEXA 07/2012 with T score -1.8. Patient previously had a DEXA with T score -2.2 with significant loss from the prior study and an ankle fracture felt to be a fragility fracture by Dr. Cherylann Banas. Ultimately the decision to treat with Prolia was made (07/2010) and she has been on this for 2 years. Recommend she continue as her last DEXA has shown improvement from her prior study.  We'll plan 5 year course and then consider drug-free holiday. Repeat DEXA in 2 years. Increase calcium vitamin D reviewed. 5. Pap smear 2011.  No Pap smear done today.  No history of abnormal Pap smears.  Status post hysterectomy for benign indications. Stop screening altogether or less frequent screening interval reviewed. We'll readdress on an annual basis. 6. Colonoscopy 2 years ago. Repeat at their recommended interval. 7. Stop smoking. I reviewed stop smoking with her as have every physician who has seen her. She actually works for a [employer] who she says is always pushing her to stop smoking. She clearly understands the risks. 8. Health maintenance.  No lab work done as it is all done through her primary physician's office and endocrinologists office who follows her for her medical issues. Follow up one year, sooner as needed.    Anastasio Auerbach MD, 2:54 PM 09/24/2012

## 2012-09-24 NOTE — Patient Instructions (Signed)
Schedule your mammogram. Follow up in one year for annual exam

## 2012-10-06 ENCOUNTER — Encounter: Payer: Self-pay | Admitting: Gynecology

## 2012-10-08 ENCOUNTER — Ambulatory Visit: Payer: Self-pay | Admitting: Ophthalmology

## 2012-10-08 ENCOUNTER — Ambulatory Visit: Payer: Self-pay | Admitting: Family Medicine

## 2012-10-26 ENCOUNTER — Ambulatory Visit: Payer: Self-pay | Admitting: Ophthalmology

## 2012-11-23 ENCOUNTER — Ambulatory Visit: Payer: Self-pay | Admitting: Ophthalmology

## 2013-02-10 DIAGNOSIS — N3941 Urge incontinence: Secondary | ICD-10-CM | POA: Insufficient documentation

## 2013-02-10 DIAGNOSIS — R350 Frequency of micturition: Secondary | ICD-10-CM | POA: Insufficient documentation

## 2013-02-10 DIAGNOSIS — R339 Retention of urine, unspecified: Secondary | ICD-10-CM | POA: Insufficient documentation

## 2013-02-10 DIAGNOSIS — N393 Stress incontinence (female) (male): Secondary | ICD-10-CM | POA: Insufficient documentation

## 2013-03-04 ENCOUNTER — Telehealth: Payer: Self-pay | Admitting: *Deleted

## 2013-03-04 NOTE — Telephone Encounter (Signed)
Pt was called time for Prolia. Pt responsible for 20%, no PA needed. Apt for inj 9/22 at 1030. KW

## 2013-03-08 ENCOUNTER — Ambulatory Visit (INDEPENDENT_AMBULATORY_CARE_PROVIDER_SITE_OTHER): Payer: Medicare Other | Admitting: Gynecology

## 2013-03-08 DIAGNOSIS — M810 Age-related osteoporosis without current pathological fracture: Secondary | ICD-10-CM

## 2013-03-08 MED ORDER — DENOSUMAB 60 MG/ML ~~LOC~~ SOLN
60.0000 mg | Freq: Once | SUBCUTANEOUS | Status: AC
  Administered 2013-03-08: 60 mg via SUBCUTANEOUS

## 2013-03-11 ENCOUNTER — Ambulatory Visit (INDEPENDENT_AMBULATORY_CARE_PROVIDER_SITE_OTHER): Payer: Medicare Other | Admitting: Physician Assistant

## 2013-03-11 ENCOUNTER — Encounter: Payer: Self-pay | Admitting: Physician Assistant

## 2013-03-11 VITALS — BP 150/70 | HR 101 | Ht 66.0 in | Wt 206.1 lb

## 2013-03-11 DIAGNOSIS — E7800 Pure hypercholesterolemia, unspecified: Secondary | ICD-10-CM

## 2013-03-11 DIAGNOSIS — I10 Essential (primary) hypertension: Secondary | ICD-10-CM

## 2013-03-11 DIAGNOSIS — F17200 Nicotine dependence, unspecified, uncomplicated: Secondary | ICD-10-CM

## 2013-03-11 DIAGNOSIS — E119 Type 2 diabetes mellitus without complications: Secondary | ICD-10-CM

## 2013-03-11 DIAGNOSIS — Z720 Tobacco use: Secondary | ICD-10-CM

## 2013-03-11 DIAGNOSIS — I951 Orthostatic hypotension: Secondary | ICD-10-CM

## 2013-03-11 LAB — CBC WITH DIFFERENTIAL/PLATELET
Basophils Absolute: 0.1 10*3/uL (ref 0.0–0.1)
Basophils Relative: 0.3 % (ref 0.0–3.0)
Eosinophils Absolute: 0.5 10*3/uL (ref 0.0–0.7)
HCT: 43.2 % (ref 36.0–46.0)
Hemoglobin: 14.6 g/dL (ref 12.0–15.0)
Lymphs Abs: 4.7 10*3/uL — ABNORMAL HIGH (ref 0.7–4.0)
MCHC: 33.7 g/dL (ref 30.0–36.0)
Neutro Abs: 9.3 10*3/uL — ABNORMAL HIGH (ref 1.4–7.7)
RDW: 13.1 % (ref 11.5–14.6)

## 2013-03-11 LAB — BASIC METABOLIC PANEL
CO2: 25 mEq/L (ref 19–32)
Chloride: 103 mEq/L (ref 96–112)
Glucose, Bld: 126 mg/dL — ABNORMAL HIGH (ref 70–99)
Potassium: 4.7 mEq/L (ref 3.5–5.1)
Sodium: 137 mEq/L (ref 135–145)

## 2013-03-11 NOTE — Progress Notes (Signed)
[facility]. [address], [place]  [address] Phone: [phone] Fax:  [phone]  Date:  03/11/2013   ID:  Mckenzie, Lawrence 10/14/1945, MRN [id number]  PCP:  Delrae Rend, MD  Cardiologist:  Dr. Minus Breeding     History of Present Illness: Mckenzie Lawrence is a 67 y.o. female who returns for the evaluation of postural dizziness.  She has a hx of DM2, HTN, HL and tobacco abuse.  She was evaluated by Dr. Minus Breeding in 11/2011 for a syncopal episode.  This was felt to be vasovagal.  An exercise treadmill test was done 11/2011 and was negative for ischemic ECG changes.  She has noted postural dizziness over the last 2 mos.  She has documented a 30-40 point SBP drop from sitting to standing.  She often feels near-syncopal with this.  She also notes symptoms with exercise.  She denies chest pain, dyspnea, orthopnea, PND, edema.  She denies further syncope.  Of note, she had some orthostasis noted at her visit with Dr. Minus Breeding last year.  Decision was made then to treat this conservatively b/c of concerns of supine HTN with her multiple risk factors.  She has seen her PCP recently.  He did eliminate her Amlodipine and cut her Lisinopril in 1/2.  She notes no improvement with this.  She has increased her fluids.  She started taking Imipramine for bladder incontinence about a month ago.  She was having postural dizziness prior to starting this medication.     Wt Readings from Last 3 Encounters:  03/11/13 206 lb 1.9 oz (93.495 kg)  09/24/12 200 lb (90.719 kg)  12/04/11 206 lb 6.4 oz (93.622 kg)     Past Medical History  Diagnosis Date  . PCOS (polycystic ovarian syndrome)   . Diabetes mellitus     insuling dependent  . Osteopenia 07/2012    T score -1.8 right femoral neck fracture was not calculated   . HLD (hyperlipidemia)   . Cataracts, bilateral   . Hypertension   . Broken foot   . Hx of cardiovascular stress test     a. ETT (6/13):  Ex 5:13; no ischemic changes     Current Outpatient Prescriptions  Medication Sig Dispense Refill  . aspirin 81 MG tablet Take 81 mg by mouth daily.      . clobetasol cream (TEMOVATE) 0.05 %       . denosumab (PROLIA) 60 MG/ML SOLN injection Inject 60 mg into the skin every 6 (six) months. Administer in upper arm, thigh, or abdomen      . estradiol (ESTRACE) 1 MG tablet TAKE ONE TABLET BY MOUTH EVERY DAY  90 tablet  0  . imipramine (TOFRANIL) 25 MG tablet Take 25 mg by mouth at bedtime.      . Insulin Detemir (LEVEMIR [place]) Inject 34 Units into the skin daily.       . Insulin Lispro, Human, (HUMALOG [place]) Inject into the skin as needed.       Marland Kitchen lisinopril (PRINIVIL,ZESTRIL) 40 MG tablet Take 20 mg by mouth daily.       . metFORMIN (GLUCOPHAGE) 1000 MG tablet Take 1,000 mg by mouth 2 (two) times daily with a meal.      . pravastatin (PRAVACHOL) 40 MG tablet Take 40 mg by mouth daily.      Marland Kitchen zolpidem (AMBIEN) 10 MG tablet Take 10 mg by mouth at bedtime as needed.       No current facility-administered  medications for this visit.    Allergies:    Allergies  Allergen Reactions  . Codeine   . Iodine   . Sulfa Antibiotics     Social History:  The patient  reports that she has been smoking Cigarettes.  She has been smoking about 1.50 packs per day. She does not have any smokeless tobacco history on file. She reports that she does not drink alcohol.   ROS:  Please see the history of present illness.      All other systems reviewed and negative.   PHYSICAL EXAM: VS:  BP 150/66  Pulse 102  Ht 5\' 6"  (1.676 m)  Wt 206 lb 1.9 oz (93.495 kg)  BMI 33.28 kg/m2  Filed Vitals:   03/11/13 1204  BP: 150/66  Pulse: 102  Height: 5\' 6"  (1.676 m)  Weight: 206 lb 1.9 oz (93.495 kg)    Well nourished, well developed, in no acute distress HEENT: normal Neck: no JVD Vascular:  No carotid bruits. Cardiac:  normal S1, S2; RRR; no murmur Lungs:  clear to auscultation bilaterally, no wheezing, rhonchi or rales Abd: soft,  nontender, no hepatomegaly Ext: no edema Skin: warm and dry Neuro:  CNs 2-12 intact, no focal abnormalities noted  EKG:  NSR, HR 96, RAD, PRWP, no acute changes, no change from prior tracing     ASSESSMENT AND PLAN:  1. Orthostatic Hypotension:  She probably has developed autonomic insufficiency in the setting of long standing diabetes.  She has underlying HTN and we would want to avoid reducing her medications further for fear of causing significant supine HTN.  I would try to avoid Midodrine or Florinef for this reason as well.  Pyridostigmine could be an option that does not cause supine HTN.  I reviewed her case with Dr. Minus Breeding and we decided to pursue conservative Rx initially.  I had a long discussion with the patient for hygiene.  I have asked her to get up slowly and to pump her calf muscles.  I have also asked her to wear her compression stockings (she already has these) daily to help with avoiding postural BP drops.  I will check a BMET, CBC, TSH today. 2. Hypertension:  BP elevated in light of recent BP medication adjustments.  Continue to monitor. 3. Hyperlipidemia:  Continue statin. 4. Diabetes Mellitus:  F/u with PCP. 5. Tobacco Abuse:  She knows she needs to quit. 6. Disposition:  F/u with Dr. Minus Breeding in 6 weeks.   Signed, Richardson Dopp, PA-C  03/11/2013 12:44 PM

## 2013-03-11 NOTE — Patient Instructions (Addendum)
LABS TODAY; BMET, CBC W/DIFF, TSH  MAKE SURE TO WEAR YOUR COMPRESSION STOCKINGS DAILY  PLEASE FOLLOW UP WITH DR. HOCHREIN IN ABOUT 6 WEEKS    Orthostatic Hypotension Orthostatic hypotension is a sudden fall in blood pressure. It occurs when a person goes from a sitting or lying position to a standing position. CAUSES   Loss of body fluids (dehydration).  Medicines that lower blood pressure.  Sudden changes in posture, such as sudden standing when you have been sitting or lying down.  Taking too much of your medicine. SYMPTOMS   Lightheadedness or dizziness.  Fainting or near-fainting.  A fast heart rate (tachycardia).  Weakness.  Feeling tired (fatigue). DIAGNOSIS  Your caregiver may find the cause of orthostatic hypotension through:  A history and/or physical exam.  Checking your blood pressure. Your caregiver will check your blood pressure when you are:  Lying down.  Sitting.  Standing.  Tilt table testing. In this test, you are placed on a table that goes from a lying position to a standing position. You will be strapped to the table. This test helps to monitor your blood pressure and heart rate when you are in different positions. TREATMENT   If orthostatic hypotension is caused by your medicines, your caregiver will need to adjust your dosage. Do not stop or adjust your medicine on your own.  When changing positions, make these changes slowly. This allows your body to adjust to the different position.  Compression stockings that are worn on your lower legs may be helpful.  Your caregiver may have you consume extra salt. Do not add extra salt to your diet unless directed by your caregiver.  Eat frequent, small meals. Avoid sudden standing after eating.  Avoid hot showers or excessive heat.  Your caregiver may give you fluids through the vein (intravenous).  Your caregiver may put you on medicine to help enhance fluid retention. SEEK IMMEDIATE MEDICAL  CARE IF:   You faint or have a near-fainting episode. Call your local emergency services (911 in [place]  You have or develop chest pain.  You feel sick to your stomach (nauseous) or vomit.  You have a loss of feeling or movement in your arms or legs.  You have difficulty talking, slurred speech, or you are unable to talk.  You have difficulty thinking or have confused thinking. MAKE SURE YOU:   Understand these instructions.  Will watch your condition.  Will get help right away if you are not doing well or get worse. Document Released: 05/24/2002 Document Revised: 08/26/2011 Document Reviewed: 09/16/2008 [facility] Patient Information 2014 [place], [place].

## 2013-03-12 ENCOUNTER — Telehealth: Payer: Self-pay | Admitting: *Deleted

## 2013-03-12 NOTE — Telephone Encounter (Signed)
pt notified about lab results and eleavted WBC and to f/u w/PCP about this. Pt states WBC is usually high for her since she has a lot of problems with boils. I advised I will fax resluts to Dr. Buddy Duty, she also asked to fax to Dr. Miguel Aschoff. I said no problem. Pt then thanked me again for all of our help yesterday. I told her that is what we are here for. Pt also said she thought Richardson Dopp, [place] was very nice and very though.

## 2013-04-06 ENCOUNTER — Ambulatory Visit: Payer: 59 | Admitting: Cardiology

## 2013-04-22 ENCOUNTER — Other Ambulatory Visit: Payer: Self-pay

## 2013-05-03 ENCOUNTER — Ambulatory Visit: Payer: Medicare Other | Admitting: Cardiology

## 2013-05-05 ENCOUNTER — Ambulatory Visit (INDEPENDENT_AMBULATORY_CARE_PROVIDER_SITE_OTHER): Payer: Medicare Other | Admitting: Podiatry

## 2013-05-05 ENCOUNTER — Encounter: Payer: Self-pay | Admitting: Podiatry

## 2013-05-05 VITALS — BP 165/95 | HR 109 | Resp 18 | Ht 67.5 in | Wt 195.0 lb

## 2013-05-05 DIAGNOSIS — L600 Ingrowing nail: Secondary | ICD-10-CM

## 2013-05-06 NOTE — Progress Notes (Signed)
Mckenzie Lawrence presents today as a 67 year old white female with a chief complaint of a painful ingrown toenail to the hallux left. She states it is tender with palpation and shoe gear pressure.  Objective: Vital signs are stable she is alert and oriented x3. Cutaneous evaluation does demonstrate a sharp incurvated nail margin there is thickened. There is no erythema edema cellulitis drainage or odor.  Assessment: Ingrown nail hallux left.  Plan: Sharp debridement of the nail removing the margins. She will followup with me on an as-needed basis.

## 2013-05-25 ENCOUNTER — Encounter: Payer: Self-pay | Admitting: Podiatry

## 2013-05-25 ENCOUNTER — Ambulatory Visit (INDEPENDENT_AMBULATORY_CARE_PROVIDER_SITE_OTHER): Payer: Medicare Other | Admitting: Podiatry

## 2013-05-25 VITALS — BP 176/89 | HR 91 | Resp 16

## 2013-05-25 DIAGNOSIS — M7750 Other enthesopathy of unspecified foot: Secondary | ICD-10-CM

## 2013-05-26 NOTE — Progress Notes (Signed)
Mckenzie Lawrence presents today for chief complaint of a painful wart to the plantar aspect of her right foot. I think it's a wart.  Objective: Pulses remain strongly palpable bilateral hammertoe deformity resulting in plantar flexed second metatarsal with reactive hyperkeratosis in the area of erythema.  Assessment plantarflexed metatarsal with reactive hyperkeratosis.  Plan: Debridement of all reactive hyperkeratosis today discussed the problems with pressure and the possible resulted in ulceration. I dispensed metatarsal silicone pads I will followup with her as needed.

## 2013-05-31 ENCOUNTER — Ambulatory Visit: Payer: Medicare Other | Admitting: Cardiology

## 2013-09-24 DIAGNOSIS — J449 Chronic obstructive pulmonary disease, unspecified: Secondary | ICD-10-CM | POA: Insufficient documentation

## 2013-09-24 DIAGNOSIS — R42 Dizziness and giddiness: Secondary | ICD-10-CM | POA: Insufficient documentation

## 2013-10-12 ENCOUNTER — Encounter: Payer: Self-pay | Admitting: Gynecology

## 2013-10-12 ENCOUNTER — Ambulatory Visit (INDEPENDENT_AMBULATORY_CARE_PROVIDER_SITE_OTHER): Payer: Medicare HMO | Admitting: Gynecology

## 2013-10-12 ENCOUNTER — Other Ambulatory Visit: Payer: Self-pay | Admitting: Gynecology

## 2013-10-12 VITALS — BP 122/82 | Ht 67.0 in | Wt 200.0 lb

## 2013-10-12 DIAGNOSIS — G4700 Insomnia, unspecified: Secondary | ICD-10-CM

## 2013-10-12 DIAGNOSIS — M810 Age-related osteoporosis without current pathological fracture: Secondary | ICD-10-CM

## 2013-10-12 DIAGNOSIS — Z79890 Hormone replacement therapy (postmenopausal): Secondary | ICD-10-CM

## 2013-10-12 DIAGNOSIS — N952 Postmenopausal atrophic vaginitis: Secondary | ICD-10-CM

## 2013-10-12 MED ORDER — ZOLPIDEM TARTRATE 10 MG PO TABS: 10.0000 mg | ORAL_TABLET | Freq: Every evening | ORAL | Status: DC | PRN

## 2013-10-12 MED ORDER — ESTRADIOL 1 MG PO TABS: 1.0000 mg | ORAL_TABLET | Freq: Every day | ORAL | Status: DC

## 2013-10-12 MED ORDER — ESTRADIOL 0.5 MG PO TABS: 0.5000 mg | ORAL_TABLET | Freq: Every day | ORAL | Status: DC

## 2013-10-12 NOTE — Progress Notes (Signed)
Mckenzie Lawrence March 07, 1946 [id number]        68 y.o.  G0P0 for followup exam.  Several issues noted below.  Past medical history,surgical history, problem list, medications, allergies, family history and social history were all reviewed and documented as reviewed in the EPIC chart.  ROS:  12 system ROS performed with pertinent positives and negatives included in the history, assessment and plan.  Included Systems: General, HEENT, Neck, Cardiovascular, Pulmonary, Gastrointestinal, Genitourinary, Musculoskeletal, Dermatologic, Endocrine, Hematological, Neurologic, Psychiatric Additional significant findings : None   Exam: Kim assistant Filed Vitals:   10/12/13 1519  BP: 122/82  Height: 5\' 7"  (1.702 m)  Weight: 200 lb (90.719 kg)   General appearance:  Normal affect, orientation and appearance. Skin: Grossly normal HEENT: Without gross lesions.  No cervical or supraclavicular adenopathy. Thyroid normal.  Lungs:  Clear without wheezing, rales or rhonchi Cardiac: RR, without RMG Abdominal:  Soft, nontender, without masses, guarding, rebound, organomegaly or hernia Breasts:  Examined lying and sitting without masses, retractions, discharge or axillary adenopathy. Pelvic:  Ext/BUS/vagina with generalized atrophic changes   Adnexa  Without masses or tenderness    Anus and perineum  Normal   Rectovaginal  Normal sphincter tone without palpated masses or tenderness.    Assessment/Plan:  68 y.o. G0P0 female for annual exam.   1. HRT. Status post vaginal hysterectomy. Had been on estradiol 1 mg daily. Has decreased to 0 .5 mg. She tried stopping altogether but has had some symptoms that were acceptable. Generally does not feel as well off of HRT. I again reviewed the whole issue of HRT to include the WHI study with increased risk of stroke heart attack DVT. Advantages of transdermal and the risk of breast cancer. She does smoke and considered at higher risk. After lengthy discussion the patient  again confirms her desire to continue clearly understanding the risks and I refilled her x1 year. She'll continue on the 0.5 mg dose. 2. Atrophic genital changes. Patient is asymptomatic and we will continue to monitor. 3. Osteopenia/porosis. Has been diagnosed with osteoporosis by Dr. Cherylann Banas due to a fragility fracture of the ankle. DEXA 07/2012 T score -1.8. Started on Prolia by Dr. Cherylann Banas 07/2010. Has received annual shots and do now. Recommend she go ahead and get her shot now which will be 4 years and then we will repeat her DEXA next year at two-year interval and consider a drug-free holiday. Increase calcium vitamin D reviewed. 4. Insomnia. Patient does have some insomnia for which she has used Ambien 10 mg in the past. She asked about would prescribe her some to help on an intermittent basis. Ambien 10 mg #30 with 2 refills provided. 5. Pap smear 2011. No Pap smear done today. No history of significant abnormal Pap smears. Status post hysterectomy for benign indications. We both agreed to stop screening and she is comfortable with this per current screening guidelines. 6. Colonoscopy reported 4 years ago. Repeat at their recommended interval. 7. Stop smoking issues reviewed again. 8. Health maintenance. No routine blood work done as this is reportedly done through her primary physician's office. Followup one year, sooner as needed.   Note: This document was prepared with digital dictation and possible smart phrase technology. Any transcriptional errors that result from this process are unintentional.   Anastasio Auerbach MD, 4:55 PM 10/12/2013

## 2013-10-12 NOTE — Patient Instructions (Signed)
Followup for your Prolia injection as arranged by the office. Followup in one year for annual exam. Follow up sooner if any issues.

## 2013-10-13 ENCOUNTER — Telehealth: Payer: Self-pay | Admitting: *Deleted

## 2013-10-13 LAB — URINALYSIS W MICROSCOPIC + REFLEX CULTURE
Bacteria, UA: NONE SEEN
Bilirubin Urine: NEGATIVE
CASTS: NONE SEEN
Crystals: NONE SEEN
Glucose, UA: NEGATIVE mg/dL
Hgb urine dipstick: NEGATIVE
KETONES UR: NEGATIVE mg/dL
LEUKOCYTES UA: NEGATIVE
NITRITE: NEGATIVE
PROTEIN: 30 mg/dL — AB
Specific Gravity, Urine: 1.009 (ref 1.005–1.030)
Squamous Epithelial / LPF: NONE SEEN
Urobilinogen, UA: 0.2 mg/dL (ref 0.0–1.0)
pH: 7 (ref 5.0–8.0)

## 2013-10-13 NOTE — Telephone Encounter (Signed)
Pharmacy faxed PA for estradiol 1 mg this was done via ePA will wait for response.

## 2013-10-13 NOTE — Telephone Encounter (Signed)
rx approved until 06/16/2014

## 2013-10-14 ENCOUNTER — Telehealth: Payer: Self-pay | Admitting: *Deleted

## 2013-10-14 NOTE — Telephone Encounter (Signed)
I informed pt that a PA was sent to [place]. I will contact her when I find out if approved or not. KW CMA

## 2013-10-20 NOTE — Telephone Encounter (Signed)
PA approved by [facility] until 10/13/2015. Copy will be scanned into chart. Pt will come to office for Prolia injection on Fri 10/21/13. KW CMA

## 2013-10-20 NOTE — Telephone Encounter (Signed)
Pt is responsible for 20% of cost of medication and administration. ~$200. Pt okay with this. KW CMA

## 2013-10-22 ENCOUNTER — Ambulatory Visit (INDEPENDENT_AMBULATORY_CARE_PROVIDER_SITE_OTHER): Payer: Medicare HMO | Admitting: Gynecology

## 2013-10-22 ENCOUNTER — Encounter: Payer: Self-pay | Admitting: Gynecology

## 2013-10-22 DIAGNOSIS — M810 Age-related osteoporosis without current pathological fracture: Secondary | ICD-10-CM

## 2013-10-22 MED ORDER — DENOSUMAB 60 MG/ML ~~LOC~~ SOLN
60.0000 mg | Freq: Once | SUBCUTANEOUS | Status: AC
  Administered 2013-10-22: 60 mg via SUBCUTANEOUS

## 2014-03-28 ENCOUNTER — Other Ambulatory Visit: Payer: Self-pay | Admitting: Gynecology

## 2014-03-30 NOTE — Telephone Encounter (Signed)
Called into pharmacy

## 2014-04-01 ENCOUNTER — Other Ambulatory Visit: Payer: Self-pay

## 2014-05-17 ENCOUNTER — Other Ambulatory Visit: Payer: Self-pay

## 2014-05-17 MED ORDER — ESTRADIOL 1 MG PO TABS: 1.0000 mg | ORAL_TABLET | Freq: Every day | ORAL | Status: DC

## 2014-06-17 DIAGNOSIS — I639 Cerebral infarction, unspecified: Secondary | ICD-10-CM

## 2014-06-17 HISTORY — DX: Cerebral infarction, unspecified: I63.9

## 2014-08-04 ENCOUNTER — Telehealth: Payer: Self-pay | Admitting: *Deleted

## 2014-08-04 ENCOUNTER — Telehealth: Payer: Self-pay | Admitting: Gynecology

## 2014-08-04 NOTE — Telephone Encounter (Signed)
Order sent to [facility] per request

## 2014-08-04 NOTE — Telephone Encounter (Addendum)
Phone to patient regarding Prolia injection. Dr Phineas Real would like for her to schedule her bone density prior to another injection. I have discussed this with patient. She is requesting that she return to [facility] again for the repeat . Anderson Malta has faxed order to [facility] for bone density.  I called again and told Tyeshia that order has been sent today.

## 2014-08-04 NOTE — Telephone Encounter (Signed)
-----   Message from Sinclair Grooms sent at 08/03/2014 11:33 AM EST ----- Regarding: referral Patient needs a bone density referral sent to [facility]. Thx

## 2014-08-16 DIAGNOSIS — M8580 Other specified disorders of bone density and structure, unspecified site: Secondary | ICD-10-CM

## 2014-08-16 HISTORY — DX: Other specified disorders of bone density and structure, unspecified site: M85.80

## 2014-08-16 NOTE — Telephone Encounter (Signed)
Phone call Bone density for March 10,2016 at [facility], asked to make sure she has report sent to Dr Phineas Real and also change MD to Dr. Phineas Real, She would like benefits for Prolia to be checked so if approved to continue injections she could receive injection on the day she has her complete exam in May.

## 2014-08-23 NOTE — Telephone Encounter (Addendum)
Mckenzie Lawrence will  need calcium level last one in chart 2014. When she calls after bone density completion will check  on calcium with Dr. Phineas Real if she continues Prolia.

## 2014-08-25 ENCOUNTER — Encounter: Payer: Self-pay | Admitting: Gynecology

## 2014-08-25 ENCOUNTER — Ambulatory Visit: Payer: Self-pay | Admitting: Gynecology

## 2014-08-30 NOTE — Telephone Encounter (Signed)
Tell patient that her bone density is stable over time. Would recommend holding on Prolia for now. She has an appointment in May and we will discuss this at that time per Dr Phineas Real. I called Leon and reported the above. She stated that she would be here in May for her annual exam . Her benefits for Prolia are subject to a Prior Authorization . This was not done due to the above note. In the event that Prolia will be started PA is reguired. Her benefits are No deductible, 20% co-pay ( approx $200),  OOP $5500 ($0 met.)  If OV billed also a $45 co-pay.

## 2014-08-30 NOTE — Telephone Encounter (Deleted)
Patient Demographics     Patient Name Sex DOB SSN    Mckenzie Lawrence, Mckenzie Lawrence Female 19-Nov-1945 [id number]    Address: [address] [address]    Phone: [phone] [facility]) [phone] (Work) [phone] (Mobile) *Preferred*      RE: prolia  Received: Yesterday    Anastasio Auerbach, MD  [place]            Tell patient that her bone density is stable over time. Would recommend holding on Prolia for now. She has an appointment in May and we will discuss this at that time.       Previous Messages     ----- Message -----   From: [name]   Sent: 08/29/2014  1:11 PM    To: Anastasio Auerbach, MD  Subject: prolia                      Patient had bone density at [facility] 08/25/14 .You wanted to wait until after report on bone density to check on Prolia injection.   She will also need repeat Calcium last one was 02/2013.

## 2014-09-28 ENCOUNTER — Other Ambulatory Visit: Payer: Self-pay | Admitting: Gynecology

## 2014-09-29 ENCOUNTER — Other Ambulatory Visit: Payer: Self-pay

## 2014-09-29 MED ORDER — ESTRADIOL 1 MG PO TABS: ORAL_TABLET | ORAL | Status: DC

## 2014-09-29 NOTE — Telephone Encounter (Signed)
Patient has CE scheduled 10/17/14 and needs a refill to have enough medication until visit.

## 2014-10-07 NOTE — Op Note (Signed)
PATIENT NAME:  Mckenzie Lawrence, Mckenzie Lawrence MR#:  [id number] DATE OF BIRTH:  [birth year]  DATE OF PROCEDURE:  10/26/2012  PREOPERATIVE DIAGNOSIS: Cataract, right eye.   POSTOPERATIVE DIAGNOSIS: Cataract, right eye.  PROCEDURE PERFORMED: Extracapsular cataract extraction using phacoemulsification with placement of Alcon [id number] 17.5-diopter posterior chamber lens, a 2.25 diopters of cylinder, serial number [id number].   SURGEON: Loura Back. Keyia Moretto, M.D.   ANESTHESIA: 4% lidocaine and 0.75% Marcaine, a 50-50 mixture with 10 units/mL of HyoMax added, given as a peribulbar.   ANESTHESIOLOGIST: Dr. Ronelle Nigh.   COMPLICATIONS: None.   ESTIMATED BLOOD LOSS: Less than 1 mL.   DESCRIPTION OF PROCEDURE: The patient was brought to the operating room and each eye was anesthetized with topical proparacaine. With the patient sitting upright and fixating at a distant target, the 3:00 and 9:00 positions were marked using a marking pen. The patient was placed supine, given IV sedation and a peribulbar block. She was then prepped and draped in the usual fashion. Vertical rectus muscles were imbricated using 5-0 silk sutures, bridle sutures. The 3:00 and 9:00 marks were reinforced on the cornea, and the toric marker was brought and positioned over the 3:00 and 9:00 marks. The 20 degree position was marked per the toric calculator and 110 degrees was marked for position of incision. A 1:00 hour peritomy was carried out at the 100 degree mark. Hemostasis was obtained with cautery. A partial thickness scleral groove was made at the posterior surgical limbus and dissected anteriorly into clear cornea with an Alcon crescent knife. The anterior chamber was entered superonasally through clear cornea and through the lamellar section with a 2.6 mm paracentesis knife. DisCoVisc was used to replace the aqueous and a continuous tear circular capsulorrhexis was carried out. Hydrodelineation of the nucleus was performed and phacoemulsification  was carried out in a divide and conquer technique. Total ultrasound time was 1 minute and 32 seconds with an average power of 20% and CDE 31.31. Irrigation and aspiration was used to remove the residual cortex. The capsular bag was inflated with DisCoVisc and the intraocular lens was inserted in the capsular bag using a [employer]. The lens was rotated and the marks in the haptics were lined up with the 20 degree marks on the corneoscleral region. A Kuglen hook was used to move the iris back to check the position of the marks. Irrigation and aspiration was used to remove the residual DisCoVisc. Again, the position of the lens was checked by moving the iris back with a Kuglen hook. The wound was inflated with balanced salt and Miostat was injected in the paracentesis track. Cefuroxime 10 mL was injected through the paracentesis track. The wound was checked for leaks. None were found. Conjunctiva was closed with cautery. The bridle sutures were removed. Two drops of Vigamox were placed in the eye. A shield was placed on the eye and the patient was discharged to the recovery room in good condition.   ____________________________ Loura Back Special Ranes, MD sad:aw D: 10/26/2012 14:39:20 ET T: 10/27/2012 05:44:25 ET JOB#: [id number]  cc: Remo Lipps A. Alaisha Eversley, MD, <Dictator> Martie Lee MD ELECTRONICALLY SIGNED 10/28/2012 11:53

## 2014-10-07 NOTE — Op Note (Signed)
PATIENT NAME:  Mckenzie Lawrence, Mckenzie Lawrence MR#:  [id number] DATE OF BIRTH:  Nov 01, 1945  DATE OF PROCEDURE:  11/23/2012  PREOPERATIVE DIAGNOSIS: Cataract, left eye.   POSTOPERATIVE DIAGNOSIS: Cataract, left eye.   PROCEDURE PERFORMED: Extracapsular cataract extraction using phacoemulsification with placement of an Alcon [id number] 18.5-diopter posterior chamber lens, serial number [id number].   SURGEON: Loura Back. Cashe Gatt, MD   ANESTHESIA: 4% lidocaine and 0.75% Marcaine in a 50-50 mixture with 10 units/mL of Hylenex given as a peribulbar.   ANESTHESIOLOGIST: Dr. Benjamine Mola   COMPLICATIONS: None.   ESTIMATED BLOOD LOSS: Less than 1 mL.   DESCRIPTION OF PROCEDURE: The patient was brought to the operating room and both eyes were anesthetized with topical proparacaine. With her sitting upright and fixing at a distant target, the 3-, 6- and 9-o'clock positions were marked. An [employer] Toric Marker was used to mark the 3- and the 9-o'clock positions. The patient was placed supine, given IV sedation and a peribulbar block. She was then prepped and draped in the usual fashion. The vertical rectus muscles were imbricated using 5-0 silk suture as bridle sutures. A degree marker was placed on the eye and lined up with the 180-degree marks and the inferior 6-o'clock mark. A mark was made at 174 degrees just inside the cornea with a marking pen; also, a mark was made at 85 degrees to mark the position of the incision. A limbal peritomy was carried out for a clock hour at 85 degrees, and hemostasis was obtained with cautery. A partial thickness scleral groove was made at the posterior surgical limbus and dissected anteriorly into clear cornea with an Alcon crescent knife. The anterior chamber was entered superotemporally through clear cornea with a paracentesis knife and through the lamellar resection with a 2.6 mm keratome. DisCoVisc was used to replace the aqueous, and a continuous tear circular capsulorrhexis was carried out.  Hydrodissection was used to loosen the nucleus, and phacoemulsification was carried out in a divide and conquer technique. Ultrasound time was 2 minutes and 14 seconds, average power 22.6%, and a CDE of 47.24. Irrigation-aspiration was used to remove the residual cortex. The capsular bag was inflated with DisCoVisc, and the intraocular lens was inserted in the capsular bag under direct visualization. It was rotated to align the haptics up with the 174-degree mark. Irrigation-aspiration was used to remove the residual DisCoVisc. The position of the lens was checked again and found to be slightly counterclockwise from the intended position. This was adjusted using a Sinskey hook. The wound was inflated with balanced salt, and Miostat was injected through the paracentesis tract. Before the injection of Miostat, the position of the lens was checked again and the marks were lined up perfectly with the 174-degree marks. A 0.10 of a milliliter of cefuroxime was injected through the paracentesis tract. The wound was checked for leaks. None were found. The conjunctiva was closed with cautery. The bridle sutures were removed. Two drops of Vigamox were placed on the eye. A shield was placed on the eye. The patient was discharged to the recovery room in good condition.    ____________________________ Loura Back Demetruis Depaul, MD sad:cb D: 11/23/2012 14:04:00 ET T: 11/23/2012 14:52:36 ET JOB#: [id number]  cc: Remo Lipps A. Darcella Shiffman, MD, <Dictator> Martie Lee MD ELECTRONICALLY SIGNED 11/30/2012 13:33

## 2014-10-14 ENCOUNTER — Encounter: Payer: Medicare Other | Admitting: Gynecology

## 2014-10-17 ENCOUNTER — Encounter: Payer: Self-pay | Admitting: Gynecology

## 2014-10-17 ENCOUNTER — Ambulatory Visit (INDEPENDENT_AMBULATORY_CARE_PROVIDER_SITE_OTHER): Payer: Commercial Managed Care - HMO | Admitting: Gynecology

## 2014-10-17 VITALS — BP 134/84 | Ht 67.0 in | Wt 196.0 lb

## 2014-10-17 DIAGNOSIS — Z79890 Hormone replacement therapy (postmenopausal): Secondary | ICD-10-CM

## 2014-10-17 DIAGNOSIS — M8580 Other specified disorders of bone density and structure, unspecified site: Secondary | ICD-10-CM | POA: Diagnosis not present

## 2014-10-17 DIAGNOSIS — Z01419 Encounter for gynecological examination (general) (routine) without abnormal findings: Secondary | ICD-10-CM | POA: Diagnosis not present

## 2014-10-17 MED ORDER — ZOLPIDEM TARTRATE 10 MG PO TABS: 10.0000 mg | ORAL_TABLET | Freq: Every evening | ORAL | Status: DC | PRN

## 2014-10-17 NOTE — Patient Instructions (Signed)
Talk to your primary physician about a screening chest CT scan for lung cancer due to year smoking history.  Wean off of the estrogen as we discussed in call me if you have any issues with this.  Schedule your mammography.  You may obtain a copy of any labs that were done today by logging onto MyChart as outlined in the instructions provided with your AVS (after visit summary). The office will not call with normal lab results but certainly if there are any significant abnormalities then we will contact you.   Health Maintenance, Female A healthy lifestyle and preventative care can promote health and wellness.  Maintain regular health, dental, and eye exams.  Eat a healthy diet. Foods like vegetables, fruits, whole grains, low-fat dairy products, and lean protein foods contain the nutrients you need without too many calories. Decrease your intake of foods high in solid fats, added sugars, and salt. Get information about a proper diet from your caregiver, if necessary.  Regular physical exercise is one of the most important things you can do for your health. Most adults should get at least 150 minutes of moderate-intensity exercise (any activity that increases your heart rate and causes you to sweat) each week. In addition, most adults need muscle-strengthening exercises on 2 or more days a week.   Maintain a healthy weight. The body mass index (BMI) is a screening tool to identify possible weight problems. It provides an estimate of body fat based on height and weight. Your caregiver can help determine your BMI, and can help you achieve or maintain a healthy weight. For adults 20 years and older:  A BMI below 18.5 is considered underweight.  A BMI of 18.5 to 24.9 is normal.  A BMI of 25 to 29.9 is considered overweight.  A BMI of 30 and above is considered obese.  Maintain normal blood lipids and cholesterol by exercising and minimizing your intake of saturated fat. Eat a balanced diet  with plenty of fruits and vegetables. Blood tests for lipids and cholesterol should begin at age 48 and be repeated every 5 years. If your lipid or cholesterol levels are high, you are over 50, or you are a high risk for heart disease, you may need your cholesterol levels checked more frequently.Ongoing high lipid and cholesterol levels should be treated with medicines if diet and exercise are not effective.  If you smoke, find out from your caregiver how to quit. If you do not use tobacco, do not start.  Lung cancer screening is recommended for adults aged 7 80 years who are at high risk for developing lung cancer because of a history of smoking. Yearly low-dose computed tomography (CT) is recommended for people who have at least a 30-pack-year history of smoking and are a current smoker or have quit within the past 15 years. A pack year of smoking is smoking an average of 1 pack of cigarettes a day for 1 year (for example: 1 pack a day for 30 years or 2 packs a day for 15 years). Yearly screening should continue until the smoker has stopped smoking for at least 15 years. Yearly screening should also be stopped for people who develop a health problem that would prevent them from having lung cancer treatment.  If you are pregnant, do not drink alcohol. If you are breastfeeding, be very cautious about drinking alcohol. If you are not pregnant and choose to drink alcohol, do not exceed 1 drink per day. One drink is considered to  be 12 ounces (355 mL) of beer, 5 ounces (148 mL) of wine, or 1.5 ounces (44 mL) of liquor.  Avoid use of street drugs. Do not share needles with anyone. Ask for help if you need support or instructions about stopping the use of drugs.  High blood pressure causes heart disease and increases the risk of stroke. Blood pressure should be checked at least every 1 to 2 years. Ongoing high blood pressure should be treated with medicines, if weight loss and exercise are not  effective.  If you are 5 to 67 years old, ask your caregiver if you should take aspirin to prevent strokes.  Diabetes screening involves taking a blood sample to check your fasting blood sugar level. This should be done once every 3 years, after age 74, if you are within normal weight and without risk factors for diabetes. Testing should be considered at a younger age or be carried out more frequently if you are overweight and have at least 1 risk factor for diabetes.  Breast cancer screening is essential preventative care for women. You should practice "breast self-awareness." This means understanding the normal appearance and feel of your breasts and may include breast self-examination. Any changes detected, no matter how small, should be reported to a caregiver. Women in their 21s and 30s should have a clinical breast exam (CBE) by a caregiver as part of a regular health exam every 1 to 3 years. After age 30, women should have a CBE every year. Starting at age 96, women should consider having a mammogram (breast X-ray) every year. Women who have a family history of breast cancer should talk to their caregiver about genetic screening. Women at a high risk of breast cancer should talk to their caregiver about having an MRI and a mammogram every year.  Breast cancer gene (BRCA)-related cancer risk assessment is recommended for women who have family members with BRCA-related cancers. BRCA-related cancers include breast, ovarian, tubal, and peritoneal cancers. Having family members with these cancers may be associated with an increased risk for harmful changes (mutations) in the breast cancer genes BRCA1 and BRCA2. Results of the assessment will determine the need for genetic counseling and BRCA1 and BRCA2 testing.  The Pap test is a screening test for cervical cancer. Women should have a Pap test starting at age 98. Between ages 13 and 74, Pap tests should be repeated every 2 years. Beginning at age 6,  you should have a Pap test every 3 years as long as the past 3 Pap tests have been normal. If you had a hysterectomy for a problem that was not cancer or a condition that could lead to cancer, then you no longer need Pap tests. If you are between ages 31 and 71, and you have had normal Pap tests going back 10 years, you no longer need Pap tests. If you have had past treatment for cervical cancer or a condition that could lead to cancer, you need Pap tests and screening for cancer for at least 20 years after your treatment. If Pap tests have been discontinued, risk factors (such as a new sexual partner) need to be reassessed to determine if screening should be resumed. Some women have medical problems that increase the chance of getting cervical cancer. In these cases, your caregiver may recommend more frequent screening and Pap tests.  The human papillomavirus (HPV) test is an additional test that may be used for cervical cancer screening. The HPV test looks for the virus that  can cause the cell changes on the cervix. The cells collected during the Pap test can be tested for HPV. The HPV test could be used to screen women aged 52 years and older, and should be used in women of any age who have unclear Pap test results. After the age of 61, women should have HPV testing at the same frequency as a Pap test.  Colorectal cancer can be detected and often prevented. Most routine colorectal cancer screening begins at the age of 24 and continues through age 64. However, your caregiver may recommend screening at an earlier age if you have risk factors for colon cancer. On a yearly basis, your caregiver may provide home test kits to check for hidden blood in the stool. Use of a small camera at the end of a tube, to directly examine the colon (sigmoidoscopy or colonoscopy), can detect the earliest forms of colorectal cancer. Talk to your caregiver about this at age 75, when routine screening begins. Direct examination of  the colon should be repeated every 5 to 10 years through age 49, unless early forms of pre-cancerous polyps or small growths are found.  Hepatitis C blood testing is recommended for all people born from [birth year] through 1963 and any individual with known risks for hepatitis C.  Practice safe sex. Use condoms and avoid high-risk sexual practices to reduce the spread of sexually transmitted infections (STIs). Sexually active women aged 34 and younger should be checked for Chlamydia, which is a common sexually transmitted infection. Older women with new or multiple partners should also be tested for Chlamydia. Testing for other STIs is recommended if you are sexually active and at increased risk.  Osteoporosis is a disease in which the bones lose minerals and strength with aging. This can result in serious bone fractures. The risk of osteoporosis can be identified using a bone density scan. Women ages 13 and over and women at risk for fractures or osteoporosis should discuss screening with their caregivers. Ask your caregiver whether you should be taking a calcium supplement or vitamin D to reduce the rate of osteoporosis.  Menopause can be associated with physical symptoms and risks. Hormone replacement therapy is available to decrease symptoms and risks. You should talk to your caregiver about whether hormone replacement therapy is right for you.  Use sunscreen. Apply sunscreen liberally and repeatedly throughout the day. You should seek shade when your shadow is shorter than you. Protect yourself by wearing long sleeves, pants, a wide-brimmed hat, and sunglasses year round, whenever you are outdoors.  Notify your caregiver of new moles or changes in moles, especially if there is a change in shape or color. Also notify your caregiver if a mole is larger than the size of a pencil eraser.  Stay current with your immunizations. Document Released: 12/17/2010 Document Revised: 09/28/2012 Document Reviewed:  12/17/2010 [facility] Patient Information 2014 [place].

## 2014-10-17 NOTE — Progress Notes (Signed)
Mckenzie Lawrence 1945/10/29 [id number]        69 y.o.  G0P0 for breast and pelvic exam. Several issues noted below.  Past medical history,surgical history, problem list, medications, allergies, family history and social history were all reviewed and documented as reviewed in the EPIC chart.  ROS:  Performed with pertinent positives and negatives included in the history, assessment and plan.   Additional significant findings :  none   Exam: Kim Counsellor Vitals:   10/17/14 1434  BP: 134/84  Height: 5\' 7"  (1.702 m)  Weight: 196 lb (88.905 kg)   General appearance:  Normal affect, orientation and appearance. Skin: Grossly normal HEENT: Without gross lesions.  No cervical or supraclavicular adenopathy. Thyroid normal.  Lungs:  Clear without wheezing, rales or rhonchi Cardiac: RR, without RMG Abdominal:  Soft, nontender, without masses, guarding, rebound, organomegaly or hernia Breasts:  Examined lying and sitting without masses, retractions, discharge or axillary adenopathy. Pelvic:  Ext/BUS/vagina with atrophic changes  Adnexa  Without masses or tenderness    Anus and perineum  Normal   Rectovaginal  Normal sphincter tone without palpated masses or tenderness.    Assessment/Plan:  69 y.o. G0P0 female for breast and pelvic exam  1. Postmenopausal/atrophic genital changes. Status post TVH BSO pubovaginal sling.  Patient on 0.5 mg estradiol daily and wants to wean. I again reviewed the whole issue of HRT to include the WHI study with increased risks of stroke heart attack DVT and possible breast cancer. Patient will slowly wean off of her ERT of the next 2 months and will see how she's doing. If she does well off of HRT them will follow. If she has any issues she'll call me. 2. Osteopenia. DEXA 08/2014 with T score -2.1. Stable from prior DEXA. Has been on Prolia for 4 years due for injection now. Discussed options of drug-free holiday versus continuing. We'll go ahead and stop now  repeat DEXA in 2 years. Increased calcium vitamin D reviewed. 3. Mammography due now and patient is going to schedule in [place]. SBE monthly reviewed. 4. Pap smear 2011. No Pap smear done today. No history of abnormal Pap smears. Status post hysterectomy for benign indications. Per current screening guidelines we both agreed to stop screening and she is comfortable with this. 5. Colonoscopy 5 years ago. Patient will repeat at their recommended interval. 6. Cigarette smoking. Patient continues to smoke and does not plan to stop. I discussed the most recent screening guidelines for lung cancer to include low dose CT screening. I asked her to discuss this with her primary and consider having this done and she is going to do that. 7. Health maintenance. No routine blood work done as she reports this done through her primary physician's office. Follow up in one year, sooner as needed.     Anastasio Auerbach MD, 3:05 PM 10/17/2014

## 2014-11-04 ENCOUNTER — Encounter: Payer: Self-pay | Admitting: Gynecology

## 2014-12-12 ENCOUNTER — Other Ambulatory Visit: Payer: Self-pay

## 2015-01-03 ENCOUNTER — Other Ambulatory Visit: Payer: Self-pay | Admitting: Adult Health

## 2015-01-09 ENCOUNTER — Encounter: Payer: Self-pay | Admitting: Family Medicine

## 2015-01-09 ENCOUNTER — Ambulatory Visit
Admission: RE | Admit: 2015-01-09 | Discharge: 2015-01-09 | Disposition: A | Discharge status: Home / Self Care | Payer: Commercial Managed Care - HMO | Source: Ambulatory Visit | Attending: Family Medicine | Admitting: Family Medicine

## 2015-01-09 ENCOUNTER — Other Ambulatory Visit: Payer: Self-pay | Admitting: Family Medicine

## 2015-01-09 ENCOUNTER — Inpatient Hospital Stay: Payer: Commercial Managed Care - HMO | Attending: Family Medicine | Admitting: Family Medicine

## 2015-01-09 DIAGNOSIS — J432 Centrilobular emphysema: Secondary | ICD-10-CM | POA: Diagnosis not present

## 2015-01-09 DIAGNOSIS — Z122 Encounter for screening for malignant neoplasm of respiratory organs: Secondary | ICD-10-CM

## 2015-01-09 DIAGNOSIS — F17210 Nicotine dependence, cigarettes, uncomplicated: Secondary | ICD-10-CM | POA: Diagnosis not present

## 2015-01-09 DIAGNOSIS — Z87891 Personal history of nicotine dependence: Secondary | ICD-10-CM

## 2015-01-09 DIAGNOSIS — I2510 Atherosclerotic heart disease of native coronary artery without angina pectoris: Secondary | ICD-10-CM | POA: Insufficient documentation

## 2015-01-09 HISTORY — DX: Personal history of nicotine dependence: Z87.891

## 2015-01-09 NOTE — Progress Notes (Signed)
In accordance with CMS guidelines, patient has meet eligibility criteria including age, absence of signs or symptoms of lung cancer, the specific calculation of cigarette smoking pack-years was 102 years and is a current smoker.   A shared decision-making session was conducted prior to the performance of CT scan. This includes one or more decision aids, includes benefits and harms of screening, follow-up diagnostic testing, over-diagnosis, false positive rate, and total radiation exposure.  Counseling on the importance of adherence to annual lung cancer LDCT screening, impact of co-morbidities, and ability or willingness to undergo diagnosis and treatment is imperative for compliance of the program.  Counseling on the importance of continued smoking cessation for former smokers; the importance of smoking cessation for current smokers and information about tobacco cessation interventions have been given to patient including the [place], 1800 quit [place], as well as [place] specific smoking cessation programs.  Written order for lung cancer screening with LDCT has been given to the patient and any and all questions have been answered to the best of my abilities.   Yearly follow up will be scheduled by Burgess Estelle, Thoracic Navigator.

## 2015-01-10 ENCOUNTER — Telehealth: Payer: Self-pay | Admitting: *Deleted

## 2015-01-10 NOTE — Telephone Encounter (Signed)
  Oncology Nurse Navigator Documentation    Navigator Encounter Type: Telephone;Screening (01/10/15 1300)               Notified patient of LDCT lung cancer screening results of Lung Rads   2S finding with recommendation for 12 month follow up imaging. Also notified of incidental findings noted below. Patient reports has seen cardiologist in the past as well as currently taking ASA and medication for hyperlipidemia. Will forward to Charlestine Massed NP for further assessment if indicated.   1. Lung-RADS Category 2S, benign appearance or behavior. Continue annual screening with low-dose chest CT without contrast in 12 months. 2. The "S" modifier above refers to potentially clinically significant non lung cancer related findings. Specifically, there is extensive atherosclerosis, including left main and 3 vessel coronary artery disease. Please note that although the presence of coronary artery calcium documents the presence of coronary artery disease, the severity of this disease and any potential stenosis cannot be assessed on this non-gated CT examination. Assessment for potential risk factor modification, dietary therapy or pharmacologic therapy may be warranted, if clinically indicated. 3. Mild diffuse bronchial thickening with mild centrilobular emphysema; imaging findings suggestive of underlying COPD.

## 2015-02-16 DIAGNOSIS — I6381 Other cerebral infarction due to occlusion or stenosis of small artery: Secondary | ICD-10-CM

## 2015-02-16 HISTORY — DX: Other cerebral infarction due to occlusion or stenosis of small artery: I63.81

## 2015-03-14 ENCOUNTER — Other Ambulatory Visit: Payer: Self-pay | Admitting: Adult Health

## 2015-03-14 DIAGNOSIS — R200 Anesthesia of skin: Secondary | ICD-10-CM

## 2015-03-15 ENCOUNTER — Other Ambulatory Visit: Payer: Self-pay

## 2015-03-15 ENCOUNTER — Encounter: Payer: Self-pay | Admitting: Internal Medicine

## 2015-03-15 ENCOUNTER — Ambulatory Visit
Admission: RE | Admit: 2015-03-15 | Discharge: 2015-03-15 | Disposition: A | Discharge status: Home / Self Care | Payer: Commercial Managed Care - HMO | Source: Ambulatory Visit | Attending: Family Medicine | Admitting: Family Medicine

## 2015-03-15 ENCOUNTER — Emergency Department: Payer: Commercial Managed Care - HMO

## 2015-03-15 ENCOUNTER — Inpatient Hospital Stay
Admission: EM | Admit: 2015-03-15 | Discharge: 2015-03-16 | DRG: 066 | Disposition: A | Discharge status: Home / Self Care | Payer: Commercial Managed Care - HMO | Attending: Specialist | Admitting: Specialist

## 2015-03-15 DIAGNOSIS — Z794 Long term (current) use of insulin: Secondary | ICD-10-CM

## 2015-03-15 DIAGNOSIS — I639 Cerebral infarction, unspecified: Secondary | ICD-10-CM

## 2015-03-15 DIAGNOSIS — Z882 Allergy status to sulfonamides status: Secondary | ICD-10-CM

## 2015-03-15 DIAGNOSIS — E785 Hyperlipidemia, unspecified: Secondary | ICD-10-CM | POA: Diagnosis present

## 2015-03-15 DIAGNOSIS — Z8249 Family history of ischemic heart disease and other diseases of the circulatory system: Secondary | ICD-10-CM

## 2015-03-15 DIAGNOSIS — R200 Anesthesia of skin: Secondary | ICD-10-CM

## 2015-03-15 DIAGNOSIS — Z91013 Allergy to seafood: Secondary | ICD-10-CM

## 2015-03-15 DIAGNOSIS — Z888 Allergy status to other drugs, medicaments and biological substances status: Secondary | ICD-10-CM

## 2015-03-15 DIAGNOSIS — Z7982 Long term (current) use of aspirin: Secondary | ICD-10-CM

## 2015-03-15 DIAGNOSIS — E1142 Type 2 diabetes mellitus with diabetic polyneuropathy: Secondary | ICD-10-CM | POA: Diagnosis present

## 2015-03-15 DIAGNOSIS — E538 Deficiency of other specified B group vitamins: Secondary | ICD-10-CM | POA: Diagnosis present

## 2015-03-15 DIAGNOSIS — F17210 Nicotine dependence, cigarettes, uncomplicated: Secondary | ICD-10-CM | POA: Diagnosis present

## 2015-03-15 DIAGNOSIS — Z885 Allergy status to narcotic agent status: Secondary | ICD-10-CM

## 2015-03-15 DIAGNOSIS — H269 Unspecified cataract: Secondary | ICD-10-CM | POA: Diagnosis present

## 2015-03-15 DIAGNOSIS — Z79899 Other long term (current) drug therapy: Secondary | ICD-10-CM

## 2015-03-15 DIAGNOSIS — E7800 Pure hypercholesterolemia, unspecified: Secondary | ICD-10-CM | POA: Diagnosis present

## 2015-03-15 DIAGNOSIS — E119 Type 2 diabetes mellitus without complications: Secondary | ICD-10-CM

## 2015-03-15 DIAGNOSIS — I951 Orthostatic hypotension: Secondary | ICD-10-CM | POA: Diagnosis present

## 2015-03-15 DIAGNOSIS — E1169 Type 2 diabetes mellitus with other specified complication: Secondary | ICD-10-CM

## 2015-03-15 DIAGNOSIS — I10 Essential (primary) hypertension: Secondary | ICD-10-CM | POA: Diagnosis present

## 2015-03-15 DIAGNOSIS — E559 Vitamin D deficiency, unspecified: Secondary | ICD-10-CM | POA: Diagnosis present

## 2015-03-15 DIAGNOSIS — E282 Polycystic ovarian syndrome: Secondary | ICD-10-CM | POA: Diagnosis present

## 2015-03-15 LAB — CBC WITH DIFFERENTIAL/PLATELET
BASOS PCT: 0 %
Basophils Absolute: 0.1 10*3/uL (ref 0–0.1)
EOS ABS: 0.4 10*3/uL (ref 0–0.7)
EOS PCT: 3 %
HCT: 42 % (ref 35.0–47.0)
Hemoglobin: 13.9 g/dL (ref 12.0–16.0)
Lymphocytes Relative: 30 %
Lymphs Abs: 4.6 10*3/uL — ABNORMAL HIGH (ref 1.0–3.6)
MCH: 30.8 pg (ref 26.0–34.0)
MCHC: 33.2 g/dL (ref 32.0–36.0)
MCV: 92.7 fL (ref 80.0–100.0)
Monocytes Absolute: 0.9 10*3/uL (ref 0.2–0.9)
Monocytes Relative: 6 %
Neutro Abs: 9.3 10*3/uL — ABNORMAL HIGH (ref 1.4–6.5)
Neutrophils Relative %: 61 %
PLATELETS: 364 10*3/uL (ref 150–440)
RBC: 4.53 MIL/uL (ref 3.80–5.20)
RDW: 13.9 % (ref 11.5–14.5)
WBC: 15.2 10*3/uL — AB (ref 3.6–11.0)

## 2015-03-15 LAB — BASIC METABOLIC PANEL
Anion gap: 12 (ref 5–15)
BUN: 10 mg/dL (ref 6–20)
CHLORIDE: 101 mmol/L (ref 101–111)
CO2: 25 mmol/L (ref 22–32)
CREATININE: 1.22 mg/dL — AB (ref 0.44–1.00)
Calcium: 9.4 mg/dL (ref 8.9–10.3)
GFR, EST AFRICAN AMERICAN: 52 mL/min — AB (ref >60)
GFR, EST NON AFRICAN AMERICAN: 45 mL/min — AB (ref >60)
Glucose, Bld: 153 mg/dL — ABNORMAL HIGH (ref 65–99)
Potassium: 4.1 mmol/L (ref 3.5–5.1)
SODIUM: 138 mmol/L (ref 135–145)

## 2015-03-15 LAB — PROTIME-INR
INR: 0.99
PROTHROMBIN TIME: 13.3 s (ref 11.4–15.0)

## 2015-03-15 LAB — TROPONIN I

## 2015-03-15 MED ORDER — GADOBENATE DIMEGLUMINE 529 MG/ML IV SOLN
20.0000 mL | Freq: Once | INTRAVENOUS | Status: AC | PRN
  Administered 2015-03-15: 18 mL via INTRAVENOUS

## 2015-03-15 NOTE — ED Notes (Signed)
Patient transported to CT by this RN 

## 2015-03-15 NOTE — ED Notes (Signed)
Patient presents reporting that she is "feeling funny" and that her lip is numb on the LEFT side. Of note, patient reports that she had a MRI today that suggested that she had a CVA.

## 2015-03-15 NOTE — ED Notes (Signed)
MD at bedside. 

## 2015-03-15 NOTE — ED Notes (Signed)
Patient had MRI today and it showed she had had a stroke. Was told if she felt "funny" to come to ER. Patient feels like her mouth is pulling on the left and her left arm is heavy

## 2015-03-15 NOTE — ED Provider Notes (Signed)
[facility] Emergency Department Durelle Zepeda Note   ____________________________________________  Time seen: 2217  I have reviewed the triage vital signs and the nursing notes.   HISTORY  Chief Complaint Cerebrovascular Accident   History limited by: Not Limited   HPI Mckenzie Lawrence is a 69 y.o. female who presents to the emergency department today with concerns for left facial numbness and left upper extremity numbness. Patient states that the symptoms started acutely tonight. The patient states that they have stayed constant. She denies any symptoms in her left lower extremity. The patient states that she underwent an MRI today as an outpatient which did show an acute or subacute left lacunar infarct. The patient got the MRI because for one week she's been having some right facial numbness.  Patient denies any recent fevers, chest pain, shortness breath, palpitations, nausea vomiting or diarrhea.   Past Medical History  Diagnosis Date  . PCOS (polycystic ovarian syndrome)   . Diabetes mellitus     insuling dependent  . Osteopenia 08/2014    T score -2.1 stable from prior Dexa's  . HLD (hyperlipidemia)   . Cataracts, bilateral   . Hypertension   . Broken foot   . Hx of cardiovascular stress test     a. ETT (6/13):  Ex 5:13; no ischemic changes  . Personal history of tobacco use, presenting hazards to health 01/09/2015    Patient Active Problem List   Diagnosis Date Noted  . Personal history of tobacco use, presenting hazards to health 01/09/2015  . Syncope 12/04/2011  . Tobacco abuse 12/04/2011  . Elevated cholesterol   . Cataracts, bilateral   . Hypertension   . Broken foot   . PCOS (polycystic ovarian syndrome)   . Type II or unspecified type diabetes mellitus without mention of complication, not stated as uncontrolled   . Osteopenia     Past Surgical History  Procedure Laterality Date  . Vaginal hysterectomy  1979  . Knee surgery    .  Hand surgery    . Labial abscess    . Oophorectomy      BSO  . Pubo vag sling    . Groin abscess    . Foot surgery    . Shoulder surgery    . Cataract extraction      Current Outpatient Rx  Name  Route  Sig  Dispense  Refill  . aspirin 81 MG tablet   Oral   Take 81 mg by mouth daily.         Marland Kitchen denosumab (PROLIA) 60 MG/ML SOLN injection   Subcutaneous   Inject 60 mg into the skin every 6 (six) months. Administer in upper arm, thigh, or abdomen         . estradiol (ESTRACE) 1 MG tablet      TAKE 1 TABLET  DAILY.   90 tablet   0   . imipramine (TOFRANIL) 25 MG tablet   Oral   Take 25 mg by mouth at bedtime.         . Insulin Detemir (LEVEMIR [place])   Subcutaneous   Inject 34 Units into the skin daily.          . Insulin Lispro, Human, (HUMALOG [place])   Subcutaneous   Inject into the skin as needed.          Marland Kitchen lisinopril (PRINIVIL,ZESTRIL) 40 MG tablet   Oral   Take 20 mg by mouth daily.          Marland Kitchen  metFORMIN (GLUCOPHAGE) 1000 MG tablet   Oral   Take 1,000 mg by mouth 2 (two) times daily with a meal.         . pravastatin (PRAVACHOL) 40 MG tablet   Oral   Take 40 mg by mouth daily.         Marland Kitchen zolpidem (AMBIEN) 10 MG tablet   Oral   Take 1 tablet (10 mg total) by mouth at bedtime as needed. for sleep   30 tablet   3     FOR FUTURE REFILLS     Allergies Iodine; Codeine; and Sulfa antibiotics  Family History  Problem Relation Age of Onset  . Hypertension Mother   . Heart disease Mother 65    MI  . Diabetes Father   . Diabetes Sister   . Hypertension Sister   . Diabetes Brother   . Hypertension Brother   . Heart disease Brother 24    CAD  . Cancer Sister     Multiple myloma  . Diabetes Brother     Social History Social History  Substance Use Topics  . Smoking status: Current Every Day Smoker -- 1.50 packs/day    Types: Cigarettes  . Smokeless tobacco: Not on file  . Alcohol Use: 0.0 oz/week    0 Standard drinks or equivalent per  week     Comment: Rare    Review of Systems  Constitutional: Negative for fever. Cardiovascular: Negative for chest pain. Respiratory: Negative for shortness of breath. Gastrointestinal: Negative for abdominal pain, vomiting and diarrhea. Genitourinary: Negative for dysuria. Musculoskeletal: Negative for back pain. Skin: Negative for rash. Neurological: Left lower lip numbness, left upper extremity numbness  10-point ROS otherwise negative.  ____________________________________________   PHYSICAL EXAM:  VITAL SIGNS: ED Triage Vitals  Enc Vitals Group     BP 03/15/15 2153 222/98 mmHg     Pulse Rate 03/15/15 2153 110     Resp 03/15/15 2153 18     Temp 03/15/15 2153 98.4 F (36.9 C)     Temp Source 03/15/15 2153 Oral     SpO2 03/15/15 2153 100 %     Weight 03/15/15 2153 196 lb (88.905 kg)     Height 03/15/15 2153 5\' 8"  (1.727 m)     Head Cir --      Peak Flow --      Pain Score 03/15/15 2154 5   Constitutional: Alert and oriented. Well appearing and in no distress. Eyes: Conjunctivae are normal. PERRL. Normal extraocular movements. ENT   Head: Normocephalic and atraumatic.   Nose: No congestion/rhinnorhea.   Mouth/Throat: Mucous membranes are moist.   Neck: No stridor. Hematological/Lymphatic/Immunilogical: No cervical lymphadenopathy. Cardiovascular: Normal rate, regular rhythm.  No murmurs, rubs, or gallops. Respiratory: Normal respiratory effort without tachypnea nor retractions. Breath sounds are clear and equal bilaterally. No wheezes/rales/rhonchi. Gastrointestinal: Soft and nontender. No distention.  Genitourinary: Deferred Musculoskeletal: Normal range of motion in all extremities. No joint effusions.  No lower extremity tenderness nor edema. Neurologic:  Normal speech and language. No facial asymmetry noted. Tongue midline. Patient was some subjective numbness to the left cheek and lower jaw. Strength 4+ out of 5 in the left upper extremity. 5 out  of 5 in all the extremities. Slight left pronator drift. Some subjective numbness to palpation of the left lower arm. Finger to nose normal. NIHSS: 2 Skin:  Skin is warm, dry and intact. No rash noted. Psychiatric: Mood and affect are normal. Speech and behavior are normal. Patient exhibits  appropriate insight and judgment.  ____________________________________________    LABS (pertinent positives/negatives)  Labs Reviewed  CBC WITH DIFFERENTIAL/PLATELET - Abnormal; Notable for the following:    WBC 15.2 (*)    Neutro Abs 9.3 (*)    Lymphs Abs 4.6 (*)    All other components within normal limits  BASIC METABOLIC PANEL - Abnormal; Notable for the following:    Glucose, Bld 153 (*)    Creatinine, Ser 1.22 (*)    GFR calc non Af Amer 45 (*)    GFR calc Af Amer 52 (*)    All other components within normal limits  TROPONIN I  PROTIME-INR     ____________________________________________   EKG  I, Nance Pear, attending physician, personally viewed and interpreted this EKG  EKG Time: 2211 Rate: 107 Rhythm: sinus tachycardia Axis: normal Intervals: qtc 459 QRS: narrow ST changes: no st elevation Impression: sinus tachycardia, otherwise normal ekg   ____________________________________________    RADIOLOGY  CT head IMPRESSION: The previous MR noted small infarct in the left posterior limb internal capsule is not well seen on CT. There is no acute hemorrhage.  I, Nance Pear, personally discussed these images and results by phone with the on-call radiologist and used this discussion as part of my medical decision making.   ____________________________________________   PROCEDURES  Procedure(s) performed: None  Critical Care performed: Yes, see critical care note(s)  CRITICAL CARE Performed by: Nance Pear   Total critical care time: 30  Critical care time was exclusive of separately billable procedures and treating other patients.  Critical  care was necessary to treat or prevent imminent or life-threatening deterioration.  Critical care was time spent personally by me on the following activities: development of treatment plan with patient and/or surrogate as well as nursing, discussions with consultants, evaluation of patient's response to treatment, examination of patient, obtaining history from patient or surrogate, ordering and performing treatments and interventions, ordering and review of laboratory studies, ordering and review of radiographic studies, pulse oximetry and re-evaluation of patient's condition.  ____________________________________________   INITIAL IMPRESSION / ASSESSMENT AND PLAN / ED COURSE  Pertinent labs & imaging results that were available during my care of the patient were reviewed by me and considered in my medical decision making (see chart for details).  Patient presented to the emergency department tonight with concerns for acute left facial numbness and left upper extremity numbness. On exam patient does have some left upper extremity weakness compared to the extremities and subjective numbness to the face and left upper extremity. Patient would potentially be a candidate for TPA however I did have a discussion with the patient about the risks and benefits. Given the mild symptoms at this point I do not feel that the risk of TPA would be worth benefit. The patient did agree and strongly did not want to have tPA administered. Interestingly the patient did undergo an outpatient MRI today that showed a left-sided infarct. She got this because of some right facial numbness and tingling going on for one week. Given these 2 recent strokelike events patient will be admitted to the hospital for further workup and management.  ____________________________________________   FINAL CLINICAL IMPRESSION(S) / ED DIAGNOSES  Final diagnoses:  Left sided numbness     Nance Pear, MD 03/15/15 [phone]

## 2015-03-16 ENCOUNTER — Inpatient Hospital Stay
Admit: 2015-03-16 | Discharge: 2015-03-16 | Disposition: A | Discharge status: Home / Self Care | Payer: Commercial Managed Care - HMO | Attending: Internal Medicine | Admitting: Internal Medicine

## 2015-03-16 ENCOUNTER — Inpatient Hospital Stay: Payer: Commercial Managed Care - HMO

## 2015-03-16 DIAGNOSIS — E282 Polycystic ovarian syndrome: Secondary | ICD-10-CM | POA: Diagnosis present

## 2015-03-16 DIAGNOSIS — Z882 Allergy status to sulfonamides status: Secondary | ICD-10-CM | POA: Diagnosis not present

## 2015-03-16 DIAGNOSIS — H269 Unspecified cataract: Secondary | ICD-10-CM | POA: Diagnosis present

## 2015-03-16 DIAGNOSIS — F17210 Nicotine dependence, cigarettes, uncomplicated: Secondary | ICD-10-CM | POA: Diagnosis present

## 2015-03-16 DIAGNOSIS — E780 Pure hypercholesterolemia: Secondary | ICD-10-CM | POA: Diagnosis present

## 2015-03-16 DIAGNOSIS — Z8249 Family history of ischemic heart disease and other diseases of the circulatory system: Secondary | ICD-10-CM | POA: Diagnosis not present

## 2015-03-16 DIAGNOSIS — E1142 Type 2 diabetes mellitus with diabetic polyneuropathy: Secondary | ICD-10-CM | POA: Diagnosis present

## 2015-03-16 DIAGNOSIS — E559 Vitamin D deficiency, unspecified: Secondary | ICD-10-CM | POA: Diagnosis present

## 2015-03-16 DIAGNOSIS — I951 Orthostatic hypotension: Secondary | ICD-10-CM | POA: Diagnosis present

## 2015-03-16 DIAGNOSIS — Z79899 Other long term (current) drug therapy: Secondary | ICD-10-CM | POA: Diagnosis not present

## 2015-03-16 DIAGNOSIS — Z885 Allergy status to narcotic agent status: Secondary | ICD-10-CM | POA: Diagnosis not present

## 2015-03-16 DIAGNOSIS — Z7982 Long term (current) use of aspirin: Secondary | ICD-10-CM | POA: Diagnosis not present

## 2015-03-16 DIAGNOSIS — I639 Cerebral infarction, unspecified: Secondary | ICD-10-CM | POA: Diagnosis present

## 2015-03-16 DIAGNOSIS — I10 Essential (primary) hypertension: Secondary | ICD-10-CM | POA: Diagnosis present

## 2015-03-16 DIAGNOSIS — Z91013 Allergy to seafood: Secondary | ICD-10-CM | POA: Diagnosis not present

## 2015-03-16 DIAGNOSIS — E538 Deficiency of other specified B group vitamins: Secondary | ICD-10-CM | POA: Diagnosis present

## 2015-03-16 DIAGNOSIS — E785 Hyperlipidemia, unspecified: Secondary | ICD-10-CM | POA: Diagnosis present

## 2015-03-16 DIAGNOSIS — Z794 Long term (current) use of insulin: Secondary | ICD-10-CM | POA: Diagnosis not present

## 2015-03-16 DIAGNOSIS — Z888 Allergy status to other drugs, medicaments and biological substances status: Secondary | ICD-10-CM | POA: Diagnosis not present

## 2015-03-16 LAB — VITAMIN B12: VITAMIN B 12: 165 pg/mL — AB (ref 180–914)

## 2015-03-16 LAB — GLUCOSE, CAPILLARY
GLUCOSE-CAPILLARY: 106 mg/dL — AB (ref 65–99)
GLUCOSE-CAPILLARY: 148 mg/dL — AB (ref 65–99)
GLUCOSE-CAPILLARY: 145 mg/dL — AB (ref 65–99)

## 2015-03-16 LAB — BASIC METABOLIC PANEL
Anion gap: 9 (ref 5–15)
BUN: 9 mg/dL (ref 6–20)
CO2: 25 mmol/L (ref 22–32)
Calcium: 8.9 mg/dL (ref 8.9–10.3)
Chloride: 105 mmol/L (ref 101–111)
Creatinine, Ser: 1.19 mg/dL — ABNORMAL HIGH (ref 0.44–1.00)
GFR calc Af Amer: 54 mL/min — ABNORMAL LOW (ref >60)
GFR calc non Af Amer: 46 mL/min — ABNORMAL LOW (ref >60)
Glucose, Bld: 183 mg/dL — ABNORMAL HIGH (ref 65–99)
Potassium: 3.8 mmol/L (ref 3.5–5.1)
Sodium: 139 mmol/L (ref 135–145)

## 2015-03-16 LAB — LIPID PANEL
CHOL/HDL RATIO: 4.3 ratio
CHOLESTEROL: 146 mg/dL (ref 0–200)
HDL: 34 mg/dL — ABNORMAL LOW (ref >40)
LDL Cholesterol: 66 mg/dL (ref 0–99)
Triglycerides: 229 mg/dL — ABNORMAL HIGH (ref <150)
VLDL: 46 mg/dL — ABNORMAL HIGH (ref 0–40)

## 2015-03-16 LAB — CBC
HEMATOCRIT: 36.7 % (ref 35.0–47.0)
Hemoglobin: 12.5 g/dL (ref 12.0–16.0)
MCH: 31.5 pg (ref 26.0–34.0)
MCHC: 34 g/dL (ref 32.0–36.0)
MCV: 92.8 fL (ref 80.0–100.0)
Platelets: 290 10*3/uL (ref 150–440)
RBC: 3.96 MIL/uL (ref 3.80–5.20)
RDW: 14 % (ref 11.5–14.5)
WBC: 11.1 10*3/uL — ABNORMAL HIGH (ref 3.6–11.0)

## 2015-03-16 LAB — HEMOGLOBIN A1C: Hgb A1c MFr Bld: 6.5 % — ABNORMAL HIGH (ref 4.0–6.0)

## 2015-03-16 MED ORDER — SODIUM CHLORIDE 0.9 % IJ SOLN
3.0000 mL | Freq: Two times a day (BID) | INTRAMUSCULAR | Status: DC
  Administered 2015-03-16 (×2): 3 mL via INTRAVENOUS

## 2015-03-16 MED ORDER — CLOPIDOGREL BISULFATE 75 MG PO TABS: 75.0000 mg | ORAL_TABLET | Freq: Every day | ORAL | Status: DC

## 2015-03-16 MED ORDER — LISINOPRIL 10 MG PO TABS: 10.0000 mg | ORAL_TABLET | Freq: Every day | ORAL | Status: DC

## 2015-03-16 MED ORDER — ZOLPIDEM TARTRATE 5 MG PO TABS
5.0000 mg | ORAL_TABLET | Freq: Every evening | ORAL | Status: DC | PRN
  Administered 2015-03-16: 03:00:00 5 mg via ORAL
  Filled 2015-03-16: qty 1

## 2015-03-16 MED ORDER — STROKE: EARLY STAGES OF RECOVERY BOOK
Freq: Once | Status: AC
  Administered 2015-03-16: 03:00:00

## 2015-03-16 MED ORDER — INSULIN ASPART 100 UNIT/ML ~~LOC~~ SOLN: 0.0000 [IU] | Freq: Four times a day (QID) | SUBCUTANEOUS | Status: DC

## 2015-03-16 MED ORDER — NICOTINE 21 MG/24HR TD PT24
21.0000 mg | MEDICATED_PATCH | Freq: Every day | TRANSDERMAL | Status: DC
  Administered 2015-03-16: 21 mg via TRANSDERMAL
  Filled 2015-03-16: qty 1

## 2015-03-16 MED ORDER — GABAPENTIN 300 MG PO CAPS: 300.0000 mg | ORAL_CAPSULE | Freq: Two times a day (BID) | ORAL | Status: DC

## 2015-03-16 MED ORDER — ASPIRIN EC 81 MG PO TBEC
81.0000 mg | DELAYED_RELEASE_TABLET | Freq: Every day | ORAL | Status: DC
  Administered 2015-03-16: 09:00:00 81 mg via ORAL
  Filled 2015-03-16: qty 1

## 2015-03-16 MED ORDER — ACETAMINOPHEN 650 MG RE SUPP: 650.0000 mg | Freq: Four times a day (QID) | RECTAL | Status: DC | PRN

## 2015-03-16 MED ORDER — LABETALOL HCL 5 MG/ML IV SOLN
10.0000 mg | INTRAVENOUS | Status: DC | PRN
  Administered 2015-03-16 (×3): 10 mg via INTRAVENOUS
  Filled 2015-03-16 (×7): qty 4

## 2015-03-16 MED ORDER — ONDANSETRON HCL 4 MG/2ML IJ SOLN: 4.0000 mg | Freq: Four times a day (QID) | INTRAMUSCULAR | Status: DC | PRN

## 2015-03-16 MED ORDER — INSULIN ASPART 100 UNIT/ML ~~LOC~~ SOLN
0.0000 [IU] | Freq: Three times a day (TID) | SUBCUTANEOUS | Status: DC
  Administered 2015-03-16: 1 [IU] via SUBCUTANEOUS
  Filled 2015-03-16: qty 1

## 2015-03-16 MED ORDER — ENOXAPARIN SODIUM 40 MG/0.4ML ~~LOC~~ SOLN
40.0000 mg | Freq: Every day | SUBCUTANEOUS | Status: DC
  Administered 2015-03-16: 40 mg via SUBCUTANEOUS
  Filled 2015-03-16: qty 0.4

## 2015-03-16 MED ORDER — ACETAMINOPHEN 325 MG PO TABS: 650.0000 mg | ORAL_TABLET | Freq: Four times a day (QID) | ORAL | Status: DC | PRN

## 2015-03-16 MED ORDER — LISINOPRIL 2.5 MG PO TABS: 2.5000 mg | ORAL_TABLET | Freq: Every day | ORAL | Status: DC

## 2015-03-16 MED ORDER — VITAMIN B-12 1000 MCG PO TABS: 1000.0000 ug | ORAL_TABLET | Freq: Every day | ORAL | Status: DC

## 2015-03-16 MED ORDER — NICOTINE 10 MG IN INHA: 1.0000 | RESPIRATORY_TRACT | Status: DC | PRN

## 2015-03-16 MED ORDER — LISINOPRIL 5 MG PO TABS: 5.0000 mg | ORAL_TABLET | Freq: Every day | ORAL | Status: DC

## 2015-03-16 MED ORDER — NICOTINE 10 MG IN INHA
1.0000 | RESPIRATORY_TRACT | Status: DC | PRN
  Administered 2015-03-16 (×2): 1 via RESPIRATORY_TRACT
  Filled 2015-03-16: qty 36

## 2015-03-16 MED ORDER — PRAVASTATIN SODIUM 20 MG PO TABS
40.0000 mg | ORAL_TABLET | Freq: Every day | ORAL | Status: DC
  Administered 2015-03-16: 09:00:00 40 mg via ORAL
  Filled 2015-03-16: qty 2

## 2015-03-16 MED ORDER — SODIUM CHLORIDE 0.9 % IV SOLN
INTRAVENOUS | Status: AC
  Administered 2015-03-16: 03:00:00 via INTRAVENOUS

## 2015-03-16 MED ORDER — METFORMIN HCL 500 MG PO TABS
1000.0000 mg | ORAL_TABLET | Freq: Two times a day (BID) | ORAL | Status: DC
  Administered 2015-03-16: 1000 mg via ORAL
  Filled 2015-03-16: qty 2

## 2015-03-16 MED ORDER — METOPROLOL SUCCINATE ER 50 MG PO TB24: 50.0000 mg | ORAL_TABLET | Freq: Every day | ORAL | Status: DC

## 2015-03-16 MED ORDER — ONDANSETRON HCL 4 MG PO TABS: 4.0000 mg | ORAL_TABLET | Freq: Four times a day (QID) | ORAL | Status: DC | PRN

## 2015-03-16 NOTE — Progress Notes (Signed)
Per Dr Verdell Carmine pt does not need OT at this time, follow up with PCP for OT needs

## 2015-03-16 NOTE — H&P (Signed)
[place] [facility] NAME: Mckenzie Lawrence    MR#:  [id number]  DATE OF BIRTH:  19-Jun-1945  DATE OF ADMISSION:  03/15/2015  PRIMARY CARE PHYSICIAN: Maryland Pink, MD   REQUESTING/REFERRING PHYSICIAN: Archie Balboa, M.D.  CHIEF COMPLAINT:   Chief Complaint  Patient presents with  . Cerebrovascular Accident    HISTORY OF PRESENT ILLNESS:  Mckenzie Lawrence  is a 69 y.o. female who presents with stroke. Patient states that she first had some symptoms of weakness 8 days ago, when she had acute right lower extremity weakness causing her to stumble and fall. She states that over the next day or so that weakness improved, and her strength returned to baseline. However, she did have some persistent tingling sensation right side of her face. She saw her outpatient physician, was scheduled for an outpatient MRI which was done earlier today and showed left-sided lacunar infarct, acute to subacute. Then this evening and she was laying in bed she noticed tingling began on the left side of her face, and when she got up she noticed some left-sided weakness. This weakness was not extremely significant, the patient came in to be evaluated. Her blood pressure was also notably elevated. CT scan in the ED did not show any hemorrhage into a prior infarct, and no new infarct. Hospitalists were called for evaluation for stroke. Of note, the patient also states that she was recently diagnosed with vitamin B12 and vitamin D deficiencies.  PAST MEDICAL HISTORY:   Past Medical History  Diagnosis Date  . PCOS (polycystic ovarian syndrome)   . Diabetes mellitus     insulin dependent  . Osteopenia 08/2014    T score -2.1 stable from prior Dexa's  . HLD (hyperlipidemia)   . Cataracts, bilateral   . Hypertension   . Broken foot   . Hx of cardiovascular stress test     a. ETT (6/13):  Ex 5:13; no ischemic changes  . Personal history of tobacco use, presenting hazards to  health 01/09/2015    PAST SURGICAL HISTORY:   Past Surgical History  Procedure Laterality Date  . Vaginal hysterectomy  1979  . Knee surgery    . Hand surgery    . Labial abscess    . Oophorectomy      BSO  . Pubo vag sling    . Groin abscess    . Foot surgery    . Shoulder surgery    . Cataract extraction      SOCIAL HISTORY:   Social History  Substance Use Topics  . Smoking status: Current Every Day Smoker -- 1.50 packs/day    Types: Cigarettes  . Smokeless tobacco: Not on file  . Alcohol Use: 0.0 oz/week    0 Standard drinks or equivalent per week     Comment: Rare    FAMILY HISTORY:   Family History  Problem Relation Age of Onset  . Hypertension Mother   . Heart disease Mother 19    MI  . Diabetes Father   . Diabetes Sister   . Hypertension Sister   . Diabetes Brother   . Hypertension Brother   . Heart disease Brother 45    CAD  . Cancer Sister     Multiple myloma  . Diabetes Brother     DRUG ALLERGIES:   Allergies  Allergen Reactions  . Iodine Anaphylaxis    Ingested iodine only  . Shellfish Allergy Anaphylaxis  . Codeine Nausea And Vomiting  .  Sulfa Antibiotics Other (See Comments)    Fever    MEDICATIONS AT HOME:   Prior to Admission medications   Medication Sig Start Date End Date Taking? Authorizing Hellena Pridgen  aspirin 81 MG tablet Take 81 mg by mouth daily.    Historical Matasha Smigelski, MD  denosumab (PROLIA) 60 MG/ML SOLN injection Inject 60 mg into the skin every 6 (six) months. Administer in upper arm, thigh, or abdomen    Historical Lucianne Smestad, MD  estradiol (ESTRACE) 1 MG tablet TAKE 1 TABLET  DAILY. 09/29/14   Anastasio Auerbach, MD  imipramine (TOFRANIL) 25 MG tablet Take 25 mg by mouth at bedtime.    Historical Riki Gehring, MD  Insulin Detemir (LEVEMIR [place]) Inject 34 Units into the skin daily.     Historical Tomie Elko, MD  Insulin Lispro, Human, (HUMALOG [place]) Inject into the skin as needed.     Historical Christna Kulick, MD  lisinopril  (PRINIVIL,ZESTRIL) 40 MG tablet Take 20 mg by mouth daily.     Historical Rima Blizzard, MD  metFORMIN (GLUCOPHAGE) 1000 MG tablet Take 1,000 mg by mouth 2 (two) times daily with a meal.    Historical Cynia Abruzzo, MD  pravastatin (PRAVACHOL) 40 MG tablet Take 40 mg by mouth daily.    Historical Banjamin Stovall, MD  zolpidem (AMBIEN) 10 MG tablet Take 1 tablet (10 mg total) by mouth at bedtime as needed. for sleep 10/17/14   Anastasio Auerbach, MD    REVIEW OF SYSTEMS:  Review of Systems  Constitutional: Negative for fever, chills, weight loss and malaise/fatigue.  HENT: Negative for ear pain, hearing loss and tinnitus.   Eyes: Negative for blurred vision, double vision, pain and redness.  Respiratory: Negative for cough, hemoptysis and shortness of breath.   Cardiovascular: Negative for chest pain, palpitations, orthopnea and leg swelling.  Gastrointestinal: Negative for nausea, vomiting, abdominal pain, diarrhea and constipation.  Genitourinary: Negative for dysuria, frequency and hematuria.  Musculoskeletal: Negative for back pain, joint pain and neck pain.  Skin:       No acne, rash, or lesions  Neurological: Positive for sensory change and focal weakness. Negative for dizziness, tremors and weakness.  Endo/Heme/Allergies: Negative for polydipsia. Does not bruise/bleed easily.  Psychiatric/Behavioral: Negative for depression. The patient is not nervous/anxious and does not have insomnia.      VITAL SIGNS:   Filed Vitals:   03/15/15 2153 03/15/15 2240 03/15/15 2245  BP: 222/98    Pulse: 110 104 99  Temp: 98.4 F (36.9 C)    TempSrc: Oral    Resp: 18 18 14   Height: 5\' 8"  (1.727 m)    Weight: 88.905 kg (196 lb)    SpO2: 100% 98% 99%   Wt Readings from Last 3 Encounters:  03/15/15 88.905 kg (196 lb)  03/15/15 89.812 kg (198 lb)  01/09/15 86.637 kg (191 lb)    PHYSICAL EXAMINATION:  Physical Exam  Vitals reviewed. Constitutional: She is oriented to person, place, and time. She appears  well-developed and well-nourished. No distress.  HENT:  Head: Normocephalic and atraumatic.  Mouth/Throat: Oropharynx is clear and moist.  Eyes: Conjunctivae and EOM are normal. Pupils are equal, round, and reactive to light. No scleral icterus.  Neck: Normal range of motion. Neck supple. No JVD present. No thyromegaly present.  Cardiovascular: Normal rate, regular rhythm and intact distal pulses.  Exam reveals no gallop and no friction rub.   No murmur heard. Respiratory: Effort normal and breath sounds normal. No respiratory distress. She has no wheezes. She has no rales.  GI: Soft. Bowel sounds are normal. She exhibits no distension. There is no tenderness.  Musculoskeletal: Normal range of motion. She exhibits no edema.  No arthritis, no gout  Lymphadenopathy:    She has no cervical adenopathy.  Neurological: She is alert and oriented to person, place, and time.  Neurologic: Cranial nerves II-XII intact, Sensation intact to light touch/pinprick except for what is now bilateral lower facial tingling, 4-/5 strength left upper extremity with 4/5 strength left lower extremity, 5/5 strength right upper and lower extremities, no dysarthria, no aphasia, no dysphagia, memory intact, DTR intact, Babinski sign not present, gait testing deferred   Skin: Skin is warm and dry. No rash noted. No erythema.  Psychiatric: She has a normal mood and affect. Her behavior is normal. Judgment and thought content normal.    LABORATORY PANEL:   CBC  Recent Labs Lab 03/15/15 2212  WBC 15.2*  HGB 13.9  HCT 42.0  PLT 364   ------------------------------------------------------------------------------------------------------------------  Chemistries   Recent Labs Lab 03/15/15 2212  NA 138  K 4.1  CL 101  CO2 25  GLUCOSE 153*  BUN 10  CREATININE 1.22*  CALCIUM 9.4   ------------------------------------------------------------------------------------------------------------------  Cardiac  Enzymes  Recent Labs Lab 03/15/15 2212  TROPONINI <0.03   ------------------------------------------------------------------------------------------------------------------  RADIOLOGY:  Ct Head Wo Contrast  03/15/2015   CLINICAL DATA:  Funny feeling in her lips and numbness of the left side.  EXAM: CT HEAD WITHOUT CONTRAST  TECHNIQUE: Contiguous axial images were obtained from the base of the skull through the vertex without intravenous contrast.  COMPARISON:  MRI of brain March 15, 2015  FINDINGS: The previous MR noted small infarct in the left posterior limb internal capsule is not well seen on the head CT. There is no midline shift, hydrocephalus or mass. There is no acute hemorrhage or edema. The bony calvarium is intact. The visualized sinuses are clear.  IMPRESSION: The previous MR noted small infarct in the left posterior limb internal capsule is not well seen on CT. There is no acute hemorrhage.  These results were called by telephone at the time of interpretation on 03/15/2015 at 10:08 pm to Dr. Nance Pear , who verbally acknowledged these results.   Electronically Signed   By: Abelardo Diesel M.D.   On: 03/15/2015 22:12   Mr Jeri Cos [id number] Contrast  03/15/2015   CLINICAL DATA:  69 year old female with right-sided numbness and weakness, symptoms for 1 week. Occasional dizziness and visual changes. Initial encounter.  EXAM: MRI HEAD WITHOUT AND WITH CONTRAST  TECHNIQUE: Multiplanar, multiecho pulse sequences of the brain and surrounding structures were obtained without and with intravenous contrast.  CONTRAST:  76mL MULTIHANCE GADOBENATE DIMEGLUMINE 529 MG/ML IV SOLN  COMPARISON:  None.  FINDINGS: Small 8 mm focus of restricted diffusion at the posterior limb left internal capsule bordering the left lateral thalamus (series 7, image 21 and series 8, image 19). Mild associated T2 and FLAIR hyperintensity with no associated hemorrhage or mass effect.  No other restricted diffusion. There is a  nearby small chronic lacunar infarct of the left thalamus (series 9, image 14). Major intracranial vascular flow voids are preserved.  Cerebral volume is normal. No midline shift, mass effect, evidence of mass lesion, ventriculomegaly, extra-axial collection or acute intracranial hemorrhage. Cervicomedullary junction and pituitary are within normal limits. Minimal for age nonspecific cerebral white matter signal changes. No other encephalomalacia. No chronic cerebral blood products identified. Minimal post ischemic enhancement in the posterior limb of the left internal capsule.  No other abnormal intracranial enhancement.  Visible internal auditory structures appear normal. Trace mastoid fluid and retained secretions in the nasopharynx. Mild paranasal sinus mucosal thickening primarily on the left. Postoperative changes to the globes. Otherwise negative orbit and scalp soft tissues. Visualized bone marrow signal is within normal limits. Negative visualized cervical spine.  IMPRESSION: 1. Small acute to subacute lacunar infarct at or near the posterior limb left internal capsule. No hemorrhage or mass effect. 2. Mild chronic small vessel ischemia in the region, with a nearby chronic lacune of the left thalamus. 3. Otherwise negative for age MRI appearance of the brain. 4. Study discussed by telephone with Dr. Maryland Pink on 03/15/2015 at 1043 hours.   Electronically Signed   By: Genevie Ann M.D.   On: 03/15/2015 11:17    EKG:   Orders placed or performed in visit on 03/15/15  . EKG 12-Lead    IMPRESSION AND PLAN:  Principal Problem:   Stroke - proven left lacunar infarct on MRI brain, now patient has new symptoms on the left side, will admit, get neuro consult, check lipid panel, echocardiogram, hemoglobin A1c, defer to neurology recommendations for further imaging. Active Problems:   Accelerated hypertension - we'll keep blood pressure less than [id number] systolic given that her MR improving infarct is likely the  infarct because her right-sided symptoms greater than a week ago, and is therefore likely subacute.   Type II diabetes mellitus - sliding scale insulin with fingerstick glucose checks every 6 hours while nothing by mouth, then once she restarts a heart healthy/carb modified diet will need to be a CHS.   PCOS (polycystic ovarian syndrome) - continue home meds   Elevated cholesterol - home dose statin, check fasting lipid panel as above  All the records are reviewed and case discussed with ED Yuna Pizzolato. Management plans discussed with the patient and/or family.  DVT PROPHYLAXIS: SubQ lovenox  ADMISSION STATUS: Inpatient  CODE STATUS: Full  TOTAL TIME TAKING CARE OF THIS PATIENT: 50 minutes.    WILLIS, DAVID [place] 03/16/2015, 12:23 AM  [facility]  Office  [phone]  CC: Primary care physician; Maryland Pink, MD

## 2015-03-16 NOTE — Progress Notes (Signed)
*  PRELIMINARY RESULTS* Echocardiogram 2D Echocardiogram has been performed.  Mckenzie Lawrence 03/16/2015, 7:54 AM

## 2015-03-16 NOTE — Progress Notes (Signed)
PT Cancellation Note  Patient Details Name: Mckenzie Lawrence MRN: [id number] DOB: Oct 16, 1945   Cancelled Treatment:    Reason Eval/Treat Not Completed: PT screened, no needs identified, will sign off. PT consulted for patient after L lacunar infarct. Patient screened, deemed to be 5/5 bilaterally in hip flexion, knee extension, PF/DF bilaterally. Patient is able to ambulate in hallways while turning backwards and making a circle to converse with other staff members. No balance deficits identified and patient appears back to her baseline. PT will sign off.   Kerman Passey, PT, DPT    03/16/2015, 4:34 PM

## 2015-03-16 NOTE — Consult Note (Signed)
Reason for Consult: stroke Referring Physician: Dr. Zachery Dauer Mckenzie Lawrence is an 69 y.o. female.  HPI: seen at request of Dr. Verdell Carmine for stroke;  69 yo RHD F presents to [facility] after having some numbness and tingling on the R face and hand.  She does report a few days of weakness on the R as well.  Pt was not a tPA candidate due to extremely elevated BP.  Pt denies headache now but still has some significant numbness.  Past Medical History  Diagnosis Date  . PCOS (polycystic ovarian syndrome)   . Diabetes mellitus     insulin dependent  . Osteopenia 08/2014    T score -2.1 stable from prior Dexa's  . HLD (hyperlipidemia)   . Cataracts, bilateral   . Hypertension   . Broken foot   . Hx of cardiovascular stress test     a. ETT (6/13):  Ex 5:13; no ischemic changes  . Personal history of tobacco use, presenting hazards to health 01/09/2015    Past Surgical History  Procedure Laterality Date  . Vaginal hysterectomy  1979  . Knee surgery    . Hand surgery    . Labial abscess    . Oophorectomy      BSO  . Pubo vag sling    . Groin abscess    . Foot surgery    . Shoulder surgery    . Cataract extraction      Family History  Problem Relation Age of Onset  . Hypertension Mother   . Heart disease Mother 52    MI  . Diabetes Father   . Diabetes Sister   . Hypertension Sister   . Diabetes Brother   . Hypertension Brother   . Heart disease Brother 81    CAD  . Cancer Sister     Multiple myloma  . Diabetes Brother     Social History:  reports that she has been smoking Cigarettes.  She has been smoking about 1.50 packs per day. She does not have any smokeless tobacco history on file. She reports that she drinks alcohol. She reports that she does not use illicit drugs.  Allergies:  Allergies  Allergen Reactions  . Iodine Anaphylaxis    Ingested iodine only  . Shellfish Allergy Anaphylaxis  . Codeine Nausea And Vomiting  . Sulfa Antibiotics Other (See Comments)    Fever     Medications: personally reviewed by me as per  chart  Results for orders placed or performed during the hospital encounter of 03/15/15 (from the past 48 hour(s))  CBC with Differential     Status: Abnormal   Collection Time: 03/15/15 10:12 PM  Result Value Ref Range   WBC 15.2 (H) 3.6 - 11.0 K/uL   RBC 4.53 3.80 - 5.20 MIL/uL   Hemoglobin 13.9 12.0 - 16.0 g/dL   HCT 42.0 35.0 - 47.0 %   MCV 92.7 80.0 - 100.0 fL   MCH 30.8 26.0 - 34.0 pg   MCHC 33.2 32.0 - 36.0 g/dL   RDW 13.9 11.5 - 14.5 %   Platelets 364 150 - 440 K/uL   Neutrophils Relative % 61 %   Neutro Abs 9.3 (H) 1.4 - 6.5 K/uL   Lymphocytes Relative 30 %   Lymphs Abs 4.6 (H) 1.0 - 3.6 K/uL   Monocytes Relative 6 %   Monocytes Absolute 0.9 0.2 - 0.9 K/uL   Eosinophils Relative 3 %   Eosinophils Absolute 0.4 0 - 0.7 K/uL  Basophils Relative 0 %   Basophils Absolute 0.1 0 - 0.1 K/uL  Basic metabolic panel     Status: Abnormal   Collection Time: 03/15/15 10:12 PM  Result Value Ref Range   Sodium 138 135 - 145 mmol/L   Potassium 4.1 3.5 - 5.1 mmol/L   Chloride 101 101 - 111 mmol/L   CO2 25 22 - 32 mmol/L   Glucose, Bld 153 (H) 65 - 99 mg/dL   BUN 10 6 - 20 mg/dL   Creatinine, Ser 1.22 (H) 0.44 - 1.00 mg/dL   Calcium 9.4 8.9 - 10.3 mg/dL   GFR calc non Af Amer 45 (L) >60 mL/min   GFR calc Af Amer 52 (L) >60 mL/min    Comment: (NOTE) The eGFR has been calculated using the CKD EPI equation. This calculation has not been validated in all clinical situations. eGFR's persistently <60 mL/min signify possible Chronic Kidney Disease.    Anion gap 12 5 - 15  Troponin I     Status: None   Collection Time: 03/15/15 10:12 PM  Result Value Ref Range   Troponin I <0.03 <0.031 ng/mL    Comment:        NO INDICATION OF MYOCARDIAL INJURY.   Protime-INR     Status: None   Collection Time: 03/15/15 10:12 PM  Result Value Ref Range   Prothrombin Time 13.3 11.4 - 15.0 seconds   INR 0.99   Glucose, capillary     Status:  Abnormal   Collection Time: 03/16/15  3:00 AM  Result Value Ref Range   Glucose-Capillary 106 (H) 65 - 99 mg/dL  Basic metabolic panel     Status: Abnormal   Collection Time: 03/16/15  5:26 AM  Result Value Ref Range   Sodium 139 135 - 145 mmol/L   Potassium 3.8 3.5 - 5.1 mmol/L   Chloride 105 101 - 111 mmol/L   CO2 25 22 - 32 mmol/L   Glucose, Bld 183 (H) 65 - 99 mg/dL   BUN 9 6 - 20 mg/dL   Creatinine, Ser 1.19 (H) 0.44 - 1.00 mg/dL   Calcium 8.9 8.9 - 10.3 mg/dL   GFR calc non Af Amer 46 (L) >60 mL/min   GFR calc Af Amer 54 (L) >60 mL/min    Comment: (NOTE) The eGFR has been calculated using the CKD EPI equation. This calculation has not been validated in all clinical situations. eGFR's persistently <60 mL/min signify possible Chronic Kidney Disease.    Anion gap 9 5 - 15  CBC     Status: Abnormal   Collection Time: 03/16/15  5:26 AM  Result Value Ref Range   WBC 11.1 (H) 3.6 - 11.0 K/uL   RBC 3.96 3.80 - 5.20 MIL/uL   Hemoglobin 12.5 12.0 - 16.0 g/dL   HCT 36.7 35.0 - 47.0 %   MCV 92.8 80.0 - 100.0 fL   MCH 31.5 26.0 - 34.0 pg   MCHC 34.0 32.0 - 36.0 g/dL   RDW 14.0 11.5 - 14.5 %   Platelets 290 150 - 440 K/uL  Lipid panel     Status: Abnormal   Collection Time: 03/16/15  5:26 AM  Result Value Ref Range   Cholesterol 146 0 - 200 mg/dL   Triglycerides 229 (H) <150 mg/dL   HDL 34 (L) >40 mg/dL   Total CHOL/HDL Ratio 4.3 RATIO   VLDL 46 (H) 0 - 40 mg/dL   LDL Cholesterol 66 0 - 99 mg/dL    Comment:  Total Cholesterol/HDL:CHD Risk Coronary Heart Disease Risk Table                     Men   Women  1/2 Average Risk   3.4   3.3  Average Risk       5.0   4.4  2 X Average Risk   9.6   7.1  3 X Average Risk  23.4   11.0        Use the calculated Patient Ratio above and the CHD Risk Table to determine the patient's CHD Risk.        ATP III CLASSIFICATION (LDL):  <100     mg/dL   Optimal  100-129  mg/dL   Near or Above                    Optimal  130-159   mg/dL   Borderline  160-189  mg/dL   High  >190     mg/dL   Very High   Glucose, capillary     Status: Abnormal   Collection Time: 03/16/15  7:14 AM  Result Value Ref Range   Glucose-Capillary 148 (H) 65 - 99 mg/dL  Glucose, capillary     Status: Abnormal   Collection Time: 03/16/15 11:21 AM  Result Value Ref Range   Glucose-Capillary 145 (H) 65 - 99 mg/dL    Ct Head Wo Contrast  03/15/2015   CLINICAL DATA:  Funny feeling in her lips and numbness of the left side.  EXAM: CT HEAD WITHOUT CONTRAST  TECHNIQUE: Contiguous axial images were obtained from the base of the skull through the vertex without intravenous contrast.  COMPARISON:  MRI of brain March 15, 2015  FINDINGS: The previous MR noted small infarct in the left posterior limb internal capsule is not well seen on the head CT. There is no midline shift, hydrocephalus or mass. There is no acute hemorrhage or edema. The bony calvarium is intact. The visualized sinuses are clear.  IMPRESSION: The previous MR noted small infarct in the left posterior limb internal capsule is not well seen on CT. There is no acute hemorrhage.  These results were called by telephone at the time of interpretation on 03/15/2015 at 10:08 pm to Dr. Nance Pear , who verbally acknowledged these results.   Electronically Signed   By: Abelardo Diesel M.D.   On: 03/15/2015 22:12   Mr Jeri Cos WP Contrast  03/15/2015   CLINICAL DATA:  69 year old female with right-sided numbness and weakness, symptoms for 1 week. Occasional dizziness and visual changes. Initial encounter.  EXAM: MRI HEAD WITHOUT AND WITH CONTRAST  TECHNIQUE: Multiplanar, multiecho pulse sequences of the brain and surrounding structures were obtained without and with intravenous contrast.  CONTRAST:  18m MULTIHANCE GADOBENATE DIMEGLUMINE 529 MG/ML IV SOLN  COMPARISON:  None.  FINDINGS: Small 8 mm focus of restricted diffusion at the posterior limb left internal capsule bordering the left lateral thalamus  (series 7, image 21 and series 8, image 19). Mild associated T2 and FLAIR hyperintensity with no associated hemorrhage or mass effect.  No other restricted diffusion. There is a nearby small chronic lacunar infarct of the left thalamus (series 9, image 14). Major intracranial vascular flow voids are preserved.  Cerebral volume is normal. No midline shift, mass effect, evidence of mass lesion, ventriculomegaly, extra-axial collection or acute intracranial hemorrhage. Cervicomedullary junction and pituitary are within normal limits. Minimal for age nonspecific cerebral white matter signal changes. No other  encephalomalacia. No chronic cerebral blood products identified. Minimal post ischemic enhancement in the posterior limb of the left internal capsule. No other abnormal intracranial enhancement.  Visible internal auditory structures appear normal. Trace mastoid fluid and retained secretions in the nasopharynx. Mild paranasal sinus mucosal thickening primarily on the left. Postoperative changes to the globes. Otherwise negative orbit and scalp soft tissues. Visualized bone marrow signal is within normal limits. Negative visualized cervical spine.  IMPRESSION: 1. Small acute to subacute lacunar infarct at or near the posterior limb left internal capsule. No hemorrhage or mass effect. 2. Mild chronic small vessel ischemia in the region, with a nearby chronic lacune of the left thalamus. 3. Otherwise negative for age MRI appearance of the brain. 4. Study discussed by telephone with Dr. Maryland Pink on 03/15/2015 at 1043 hours.   Electronically Signed   By: Genevie Ann M.D.   On: 03/15/2015 11:17   US Carotid Bilateral  03/16/2015   CLINICAL DATA:  Stroke. Hypertension, syncope, diabetes, previous tobacco abuse.  EXAM: BILATERAL CAROTID DUPLEX ULTRASOUND  TECHNIQUE: Pearline Cables scale imaging, color Doppler and duplex ultrasound was performed of bilateral carotid and vertebral arteries in the neck.  COMPARISON:  None.  REVIEW OF  SYSTEMS: Quantification of carotid stenosis is based on velocity parameters that correlate the residual internal carotid diameter with NASCET-based stenosis levels, using the diameter of the distal internal carotid lumen as the denominator for stenosis measurement.  The following velocity measurements were obtained:  PEAK SYSTOLIC/END DIASTOLIC  RIGHT  ICA:                     59 /17cm/sec  CCA:                     31/5QM/GQQ  SYSTOLIC ICA/CCA RATIO:  1.6  DIASTOLIC ICA/CCA RATIO: 2.1  ECA:                     178cm/sec  LEFT  ICA:                     91/26cm/sec  CCA:                     76/1PJ/KDT  SYSTOLIC ICA/CCA RATIO:  1.7  DIASTOLIC ICA/CCA RATIO: 3.5  ECA:                     181cm/sec  FINDINGS: RIGHT CAROTID ARTERY: Mild plaque in the bulb. No high-grade stenosis. Normal waveforms and color Doppler signal.  RIGHT VERTEBRAL ARTERY:  Normal flow direction and waveform.  LEFT CAROTID ARTERY: Partially calcified plaque effaces the carotid bulb and extends into the proximal ICA and ECA, resulting in at least mild stenosis. There mildly elevated peak systolic velocities in the proximal external carotid artery at the level of the plaque. Otherwise normal waveforms and color Doppler signal.  LEFT VERTEBRAL ARTERY: Normal flow direction and waveform.  IMPRESSION: 1. Bilateral carotid bifurcation plaque, left greater than right, resulting in less than 50% diameter stenosis. The exam does not exclude plaque ulceration or embolization. Continued surveillance recommended.   Electronically Signed   By: Lucrezia Europe M.D.   On: 03/16/2015 15:04    Review of Systems  Constitutional: Negative.   HENT: Negative.   Eyes: Negative.   Respiratory: Negative.   Cardiovascular: Negative.   Gastrointestinal: Negative.   Genitourinary: Negative.   Musculoskeletal: Negative.   Skin: Negative.   Neurological: Positive  for sensory change and focal weakness. Negative for dizziness, tingling, tremors, speech change, seizures  and loss of consciousness.  Psychiatric/Behavioral: Negative.    Blood pressure 171/69, pulse 94, temperature 98.1 F (36.7 C), temperature source Oral, resp. rate 20, height _0  (1.727 m), weight 88.996 kg (196 lb 3.2 oz), SpO2 99 %. Physical Exam  Nursing note and vitals reviewed. Constitutional: She is oriented to person, place, and time. She appears well-developed and well-nourished. No distress.  HENT:  Head: Normocephalic and atraumatic.  Right Ear: External ear normal.  Left Ear: External ear normal.  Nose: Nose normal.  Mouth/Throat: Oropharynx is clear and moist.  Eyes: Conjunctivae and EOM are normal. Pupils are equal, round, and reactive to light.  Neck: Normal range of motion. Neck supple.  Cardiovascular: Normal rate, regular rhythm, normal heart sounds and intact distal pulses.   No murmur heard. Respiratory: Effort normal and breath sounds normal. She has no wheezes.  GI: Soft. Bowel sounds are normal.  Musculoskeletal: Normal range of motion. She exhibits no edema.  Neurological: She is alert and oriented to person, place, and time. She has normal reflexes. She displays normal reflexes. No cranial nerve deficit. She exhibits normal muscle tone. Coordination normal.  A+Ox3, nl speech but hoarse voice, nl language PERRLA, EOMI, nl VF, face symmetric, tongue midline Mild R drift otherwise 5/5, nl tone FTN and HTS WNL 1+/4 B, mute plantars Stocking numbness, nl temp  Skin: Skin is warm and dry. She is not diaphoretic.  Psychiatric: She has a normal mood and affect.   MRI personally reviewed by me and shows small L IC lacunar infarct, mild white matter changes  Assessment/Plan: 1.  Small acute L internal capsule lacunar infarct-  Mildly symptomatic, this is due to uncontrolled BP, smoking and likely DM 2.  Peripheral neuropathy-  Long standing and likely due to DM, pt is somewhat symptomatic 3.  Orthostatic hypotension-   This appears resolved now but pt had most  difficulty on ACE inhibitors -  Would increase dose of Toprol to try to help control BP -  Goal BP < 130/80 but would gradually lower due to 3. -  D/c ASA -  Start plavix 59m daily -  Continue home statin dose -  Continue good DM control with goal Hem A1c < 7 -  Will start Neurotin 3047mBID for neuropathy -  Replace B12 100061mPO daily and Vit D 2000 units daily -  Needs therapy consults from occupational and physical -  Will sign off, please call with questions -  Needs to f/u with Dr. ShaManuella Ghazi 4-6 weeks  [email or web address]/2016, 4:06 PM

## 2015-03-16 NOTE — Progress Notes (Signed)
Speech Therapy Note: received order, reviewed chart notes and consulted NSG re: pt's status who reported pt passed the swallow screen in the ED and has been taking pills w/ water. Met w/ pt and Dtr who denied any s/s of dysphagia when eating breakfast this morning. Pt denied any deficits w/ her speech and language(pt conversed w/ SLP appropriately at conversational level). Pt and Dtr denied any need for ST services at this time. NSG to reconsult if any change in status or if any questions.

## 2015-03-16 NOTE — Progress Notes (Signed)
Pt being discharged home at this time, no noted complaints at discharge, discharge and prescriptions reviewed with pt daughter and husband, states understanding and agree with discharge and prescriptions, refuses wheelchair for discharge

## 2015-03-16 NOTE — Plan of Care (Signed)
Problem: Discharge/Transitional Outcomes Goal: Other Discharge Outcomes/Goals Outcome: Progressing Plan of care progress: -continues IV fluids -NIH score 2  -US carotid ordered -ECHO completed this morning -up ambulating independ -BP increased PRN BP meds given per orders with results -smokes 3 packs/day supplemental nicotine ordered -neurology consulting -tolerates diet -no complaints of pain, no distress or discomfort noted

## 2015-03-16 NOTE — Progress Notes (Signed)
PT Cancellation Note  Patient Details Name: RANDY RITA MRN: [id number] DOB: 1946-03-20   Cancelled Treatment:     Chart reviewed and RN consulted. RN reports that order can be cancelled. Pt has no deficits and is fully independent with ambulation. Pt is currently pending discharge. Will complete order. Please enter new order if status changes or needs arise.  Lyndel Safe Huprich PT, DPT   Huprich,Jason 03/16/2015, 3:12 PM

## 2015-03-17 NOTE — Discharge Summary (Signed)
[facility] [place] NAME: Mckenzie Lawrence    MR#:  [id number]  DATE OF BIRTH:  07/10/1945  DATE OF ADMISSION:  03/15/2015 ADMITTING PHYSICIAN: Lance Coon, MD  DATE OF DISCHARGE: 03/16/2015  5:15 PM  PRIMARY CARE PHYSICIAN: Valora Corporal, MD    ADMISSION DIAGNOSIS:  Left sided numbness [R20.0]  DISCHARGE DIAGNOSIS:  Principal Problem:   Stroke Active Problems:   PCOS (polycystic ovarian syndrome)   Type II diabetes mellitus   Elevated cholesterol   Accelerated hypertension   SECONDARY DIAGNOSIS:   Past Medical History  Diagnosis Date  . PCOS (polycystic ovarian syndrome)   . Diabetes mellitus     insulin dependent  . Osteopenia 08/2014    T score -2.1 stable from prior Dexa's  . HLD (hyperlipidemia)   . Cataracts, bilateral   . Hypertension   . Broken foot   . Hx of cardiovascular stress test     a. ETT (6/13):  Ex 5:13; no ischemic changes  . Personal history of tobacco use, presenting hazards to health 01/09/2015    HOSPITAL COURSE:   69 year old female with past medical history of diabetes, ongoing tobacco abuse, hypertension, hyperlipidemia, history of cataracts who presented to the hospital due to right sided hand numbness and also some unsteady gait.  #1 acute CVA-this is likely the cause of patient's neurologic symptoms as mentioned above. Patient's CT scan of the head and MRI were positive for a lacunar infarct in the left internal capsule. -Patient was on aspirin prior to coming in and therefore started on Plavix. A neurology consult was obtained. And they recommended continuing with medications aspirin and Plavix along with a statin. -Patient had a carotid duplex which was negative for any sick hemodynamically significant carotid stenosis. Patient did have significantly elevated blood pressures which likely led to patient's lacunar infarct. -Patient still had some mild numbness in her right hand but otherwise  no other focal neurologic deficits and was seen by physical therapy and did not require any services. Patient was therefore discharged home with follow-up with her PCP as outpatient.  #2 accelerated hypertension-this is likely related to patient's acute lacunar infarct. Patient apparently had been titrated off her high-dose lisinopril and she had developed some relative hypotension with it. -She likely would benefit from being on ACE inhibitor given her history of diabetes but the family was not comfortable taking it again given her hypotension in the past. Patient was on Toprol which was increased prior to discharge. Patient did receive some intermittent labetalol to get her blood pressure down but some element of her hypertension as tolerated given her acute CVA.  #3 diabetes type 2 without consultation-patient will continue her Lantus and metformin.  #4 diabetic neuropathy-patient was started on some low-dose Neurontin prior to discharge.  #5 hyperlipidemia-patient will continue her Pravachol.  #6 tobacco abuse-while in the hospital patient was maintained on nicotine patch and Nicotrol inhaler and she was discharged on those and strongly advised to quit smoking.    DISCHARGE CONDITIONS:   Stable  CONSULTS OBTAINED:     DRUG ALLERGIES:   Allergies  Allergen Reactions  . Iodine Anaphylaxis    Ingested iodine only  . Shellfish Allergy Anaphylaxis  . Codeine Nausea And Vomiting  . Sulfa Antibiotics Other (See Comments)    Fever    DISCHARGE MEDICATIONS:   Discharge Medication List as of 03/16/2015  5:04 PM    START taking these medications   Details  clopidogrel (  PLAVIX) 75 MG tablet Take 1 tablet (75 mg total) by mouth daily., Starting 03/16/2015, Until Discontinued, Print    gabapentin (NEURONTIN) 300 MG capsule Take 1 capsule (300 mg total) by mouth 2 (two) times daily., Starting 03/16/2015, Until Discontinued, Print    nicotine (NICOTROL) 10 MG inhaler Inhale 1 cartridge (1  continuous puffing total) into the lungs as needed for smoking cessation., Starting 03/16/2015, Until Discontinued, Print      CONTINUE these medications which have CHANGED   Details  metoprolol succinate (TOPROL-XL) 50 MG 24 hr tablet Take 1 tablet (50 mg total) by mouth daily., Starting 03/16/2015, Until Discontinued, Print      CONTINUE these medications which have NOT CHANGED   Details  aspirin 81 MG tablet Take 81 mg by mouth at bedtime. , Until Discontinued, Historical Med    imipramine (TOFRANIL) 25 MG tablet Take 50 mg by mouth at bedtime. , Until Discontinued, Historical Med    Insulin Detemir (LEVEMIR Bassett) Inject 40 Units into the skin daily before breakfast. , Until Discontinued, Historical Med    metFORMIN (GLUCOPHAGE) 1000 MG tablet Take 1,000 mg by mouth 2 (two) times daily with a meal., Until Discontinued, Historical Med    pravastatin (PRAVACHOL) 40 MG tablet Take 40 mg by mouth at bedtime. , Until Discontinued, Historical Med    zolpidem (AMBIEN) 10 MG tablet Take 1 tablet (10 mg total) by mouth at bedtime as needed. for sleep, Starting 10/17/2014, Until Discontinued, Print      STOP taking these medications     lisinopril (PRINIVIL,ZESTRIL) 40 MG tablet          DISCHARGE INSTRUCTIONS:   DIET:  Cardiac diet and Diabetic diet  DISCHARGE CONDITION:  Stable  ACTIVITY:  Activity as tolerated  OXYGEN:  Home Oxygen: No.   Oxygen Delivery: room air  DISCHARGE LOCATION:  home   If you experience worsening of your admission symptoms, develop shortness of breath, life threatening emergency, suicidal or homicidal thoughts you must seek medical attention immediately by calling 911 or calling your MD immediately  if symptoms less severe.  You Must read complete instructions/literature along with all the possible adverse reactions/side effects for all the Medicines you take and that have been prescribed to you. Take any new Medicines after you have completely  understood and accpet all the possible adverse reactions/side effects.   Please note  You were cared for by a hospitalist during your hospital stay. If you have any questions about your discharge medications or the care you received while you were in the hospital after you are discharged, you can call the unit and asked to speak with the hospitalist on call if the hospitalist that took care of you is not available. Once you are discharged, your primary care physician will handle any further medical issues. Please note that NO REFILLS for any discharge medications will be authorized once you are discharged, as it is imperative that you return to your primary care physician (or establish a relationship with a primary care physician if you do not have one) for your aftercare needs so that they can reassess your need for medications and monitor your lab values.     Today   Patient still has some right hand paresthesias and numbness. No other focal motor or sensory deficits. No headache, nausea, vomiting. Daughter at bedside  VITAL SIGNS:  Blood pressure 171/69, pulse 94, temperature 98.1 F (36.7 C), temperature source Oral, resp. rate 20, height 5\' 8"  (1.727 m), weight  88.996 kg (196 lb 3.2 oz), SpO2 99 %.  I/O:  No intake or output data in the 24 hours ending 03/17/15 1555  PHYSICAL EXAMINATION:  GENERAL:  69 y.o.-year-old patient lying in the bed with no acute distress.  EYES: Pupils equal, round, reactive to light and accommodation. No scleral icterus. Extraocular muscles intact.  HEENT: Head atraumatic, normocephalic. Oropharynx and nasopharynx clear.  NECK:  Supple, no jugular venous distention. No thyroid enlargement, no tenderness.  LUNGS: Normal breath sounds bilaterally, no wheezing, rales,rhonchi. No use of accessory muscles of respiration.  CARDIOVASCULAR: S1, S2 normal. No murmurs, rubs, or gallops.  ABDOMEN: Soft, non-tender, non-distended. Bowel sounds present. No organomegaly  or mass.  EXTREMITIES: No pedal edema, cyanosis, or clubbing.  NEUROLOGIC: Cranial nerves II through XII are intact. No focal defecits b/l. Mild paresthesias and decreased sensation on the right hand. PSYCHIATRIC: The patient is alert and oriented x 3. Good affect.  SKIN: No obvious rash, lesion, or ulcer.   DATA REVIEW:   CBC  Recent Labs Lab 03/16/15 0526  WBC 11.1*  HGB 12.5  HCT 36.7  PLT 290    Chemistries   Recent Labs Lab 03/16/15 0526  NA 139  K 3.8  CL 105  CO2 25  GLUCOSE 183*  BUN 9  CREATININE 1.19*  CALCIUM 8.9    Cardiac Enzymes  Recent Labs Lab 03/15/15 2212  Branson <0.03    Microbiology Results  No results found for this or any previous visit.  RADIOLOGY:  Ct Head Wo Contrast  03/15/2015   CLINICAL DATA:  Funny feeling in her lips and numbness of the left side.  EXAM: CT HEAD WITHOUT CONTRAST  TECHNIQUE: Contiguous axial images were obtained from the base of the skull through the vertex without intravenous contrast.  COMPARISON:  MRI of brain March 15, 2015  FINDINGS: The previous MR noted small infarct in the left posterior limb internal capsule is not well seen on the head CT. There is no midline shift, hydrocephalus or mass. There is no acute hemorrhage or edema. The bony calvarium is intact. The visualized sinuses are clear.  IMPRESSION: The previous MR noted small infarct in the left posterior limb internal capsule is not well seen on CT. There is no acute hemorrhage.  These results were called by telephone at the time of interpretation on 03/15/2015 at 10:08 pm to Dr. Nance Pear , who verbally acknowledged these results.   Electronically Signed   By: Abelardo Diesel M.D.   On: 03/15/2015 22:12   US Carotid Bilateral  03/16/2015   CLINICAL DATA:  Stroke. Hypertension, syncope, diabetes, previous tobacco abuse.  EXAM: BILATERAL CAROTID DUPLEX ULTRASOUND  TECHNIQUE: Pearline Cables scale imaging, color Doppler and duplex ultrasound was performed of  bilateral carotid and vertebral arteries in the neck.  COMPARISON:  None.  REVIEW OF SYSTEMS: Quantification of carotid stenosis is based on velocity parameters that correlate the residual internal carotid diameter with NASCET-based stenosis levels, using the diameter of the distal internal carotid lumen as the denominator for stenosis measurement.  The following velocity measurements were obtained:  PEAK SYSTOLIC/END DIASTOLIC  RIGHT  ICA:                     59 /17cm/sec  CCA:                     0000000  SYSTOLIC ICA/CCA RATIO:  1.6  DIASTOLIC ICA/CCA RATIO: 2.1  ECA:  178cm/sec  LEFT  ICA:                     91/26cm/sec  CCA:                     99991111  SYSTOLIC ICA/CCA RATIO:  1.7  DIASTOLIC ICA/CCA RATIO: 3.5  ECA:                     181cm/sec  FINDINGS: RIGHT CAROTID ARTERY: Mild plaque in the bulb. No high-grade stenosis. Normal waveforms and color Doppler signal.  RIGHT VERTEBRAL ARTERY:  Normal flow direction and waveform.  LEFT CAROTID ARTERY: Partially calcified plaque effaces the carotid bulb and extends into the proximal ICA and ECA, resulting in at least mild stenosis. There mildly elevated peak systolic velocities in the proximal external carotid artery at the level of the plaque. Otherwise normal waveforms and color Doppler signal.  LEFT VERTEBRAL ARTERY: Normal flow direction and waveform.  IMPRESSION: 1. Bilateral carotid bifurcation plaque, left greater than right, resulting in less than 50% diameter stenosis. The exam does not exclude plaque ulceration or embolization. Continued surveillance recommended.   Electronically Signed   By: Lucrezia Europe M.D.   On: 03/16/2015 15:04      Management plans discussed with the patient, family and they are in agreement.  CODE STATUS:  Advance Directive Documentation        Most Recent Value   Type of Advance Directive  Healthcare Power of Attorney, Living will   Pre-existing out of facility DNR order (yellow form or pink  MOST form)     "MOST" Form in Place?        TOTAL TIME TAKING CARE OF THIS PATIENT: 40 minutes.    Henreitta Leber M.D on 03/17/2015 at 3:55 PM  Between 7am to 6pm - Pager - [phone]  After 6pm go to [email or web address] - password [email or web address]  Office  [phone]  CC: Primary care physician; Valora Corporal, MD

## 2015-04-26 ENCOUNTER — Ambulatory Visit: Payer: Commercial Managed Care - HMO | Attending: Neurology

## 2015-04-26 DIAGNOSIS — G4733 Obstructive sleep apnea (adult) (pediatric): Secondary | ICD-10-CM | POA: Diagnosis not present

## 2015-05-15 ENCOUNTER — Other Ambulatory Visit: Payer: Self-pay | Admitting: Neurology

## 2015-05-15 DIAGNOSIS — I6381 Other cerebral infarction due to occlusion or stenosis of small artery: Secondary | ICD-10-CM

## 2015-05-22 ENCOUNTER — Ambulatory Visit
Admission: RE | Admit: 2015-05-22 | Discharge: 2015-05-22 | Disposition: A | Discharge status: Home / Self Care | Payer: Commercial Managed Care - HMO | Source: Ambulatory Visit | Attending: Neurology | Admitting: Neurology

## 2015-05-22 DIAGNOSIS — G9389 Other specified disorders of brain: Secondary | ICD-10-CM | POA: Insufficient documentation

## 2015-05-22 DIAGNOSIS — I639 Cerebral infarction, unspecified: Secondary | ICD-10-CM | POA: Diagnosis present

## 2015-05-22 DIAGNOSIS — I6381 Other cerebral infarction due to occlusion or stenosis of small artery: Secondary | ICD-10-CM

## 2015-05-22 DIAGNOSIS — J349 Unspecified disorder of nose and nasal sinuses: Secondary | ICD-10-CM | POA: Diagnosis not present

## 2015-05-22 DIAGNOSIS — I739 Peripheral vascular disease, unspecified: Secondary | ICD-10-CM | POA: Diagnosis not present

## 2015-05-22 DIAGNOSIS — Z8673 Personal history of transient ischemic attack (TIA), and cerebral infarction without residual deficits: Secondary | ICD-10-CM | POA: Diagnosis present

## 2015-05-22 DIAGNOSIS — G319 Degenerative disease of nervous system, unspecified: Secondary | ICD-10-CM | POA: Insufficient documentation

## 2015-05-26 ENCOUNTER — Ambulatory Visit: Payer: Commercial Managed Care - HMO | Attending: Neurology

## 2015-05-26 DIAGNOSIS — G4733 Obstructive sleep apnea (adult) (pediatric): Secondary | ICD-10-CM | POA: Diagnosis not present

## 2015-05-29 ENCOUNTER — Ambulatory Visit (INDEPENDENT_AMBULATORY_CARE_PROVIDER_SITE_OTHER): Payer: Commercial Managed Care - HMO | Admitting: Podiatry

## 2015-05-29 ENCOUNTER — Encounter: Payer: Self-pay | Admitting: Podiatry

## 2015-05-29 VITALS — BP 148/74 | HR 78 | Resp 12

## 2015-05-29 DIAGNOSIS — M2040 Other hammer toe(s) (acquired), unspecified foot: Secondary | ICD-10-CM

## 2015-05-29 DIAGNOSIS — M79673 Pain in unspecified foot: Secondary | ICD-10-CM

## 2015-05-29 DIAGNOSIS — Q828 Other specified congenital malformations of skin: Secondary | ICD-10-CM | POA: Diagnosis not present

## 2015-05-29 NOTE — Progress Notes (Signed)
She presents today with chief complaint of cracking to the bilateral heels and a callus of the forefoot right. She states that she's had a history of the calcific forefoot right times several years and had even had hammertoe repair in order to alleviate her symptoms. She states that however I'm concerned that this may be reoccurring.  Objective: Vital signs are stable alert and oriented 3. Pulses are strongly palpable. Neurologic sensorium is intact per Semmes-Weinstein monofilament. Deep tendon reflexes are intact and muscle strength is normal. Hammertoe deformities flexible in nature 2 through 5 of the right foot. Reactive hyperkeratosis subsecond and third metatarsal of the right foot with an underlying area of erythema. There is no skin breakdown. She does have multiple skin fissures to the posterior heels we discussed pros and cons of this today and we also discussed ways to help eliminate these symptoms and signs.  Assessment: Heel fissures bilateral. Hammertoe deformities resulting in tyloma forefoot right.  Plan: Debridement of reactive hyperkeratosis today. Remember to ask her about seeing Dr. Sharlett Iles.

## 2015-06-06 ENCOUNTER — Ambulatory Visit: Payer: Commercial Managed Care - HMO

## 2015-07-12 DIAGNOSIS — Z720 Tobacco use: Secondary | ICD-10-CM | POA: Diagnosis not present

## 2015-07-12 DIAGNOSIS — Z794 Long term (current) use of insulin: Secondary | ICD-10-CM | POA: Diagnosis not present

## 2015-07-12 DIAGNOSIS — N183 Chronic kidney disease, stage 3 (moderate): Secondary | ICD-10-CM | POA: Diagnosis not present

## 2015-07-12 DIAGNOSIS — E1122 Type 2 diabetes mellitus with diabetic chronic kidney disease: Secondary | ICD-10-CM | POA: Diagnosis not present

## 2015-07-12 DIAGNOSIS — E1142 Type 2 diabetes mellitus with diabetic polyneuropathy: Secondary | ICD-10-CM | POA: Diagnosis not present

## 2015-07-18 DIAGNOSIS — Z9181 History of falling: Secondary | ICD-10-CM | POA: Diagnosis not present

## 2015-07-18 DIAGNOSIS — L03031 Cellulitis of right toe: Secondary | ICD-10-CM | POA: Diagnosis not present

## 2015-07-18 DIAGNOSIS — Z8709 Personal history of other diseases of the respiratory system: Secondary | ICD-10-CM | POA: Diagnosis not present

## 2015-07-26 DIAGNOSIS — G4733 Obstructive sleep apnea (adult) (pediatric): Secondary | ICD-10-CM | POA: Diagnosis not present

## 2015-07-26 DIAGNOSIS — I639 Cerebral infarction, unspecified: Secondary | ICD-10-CM | POA: Diagnosis not present

## 2015-08-02 DIAGNOSIS — R350 Frequency of micturition: Secondary | ICD-10-CM | POA: Diagnosis not present

## 2015-08-02 DIAGNOSIS — N390 Urinary tract infection, site not specified: Secondary | ICD-10-CM | POA: Diagnosis not present

## 2015-08-10 DIAGNOSIS — J439 Emphysema, unspecified: Secondary | ICD-10-CM | POA: Diagnosis not present

## 2015-08-10 DIAGNOSIS — J209 Acute bronchitis, unspecified: Secondary | ICD-10-CM | POA: Diagnosis not present

## 2015-08-10 DIAGNOSIS — G4733 Obstructive sleep apnea (adult) (pediatric): Secondary | ICD-10-CM | POA: Diagnosis not present

## 2015-08-11 DIAGNOSIS — G4733 Obstructive sleep apnea (adult) (pediatric): Secondary | ICD-10-CM | POA: Diagnosis not present

## 2015-08-14 ENCOUNTER — Ambulatory Visit (INDEPENDENT_AMBULATORY_CARE_PROVIDER_SITE_OTHER): Payer: PPO | Admitting: Podiatry

## 2015-08-14 ENCOUNTER — Encounter: Payer: Self-pay | Admitting: Podiatry

## 2015-08-14 DIAGNOSIS — Q828 Other specified congenital malformations of skin: Secondary | ICD-10-CM

## 2015-08-14 NOTE — Progress Notes (Signed)
She presents today for follow-up of chronic painful calluses plantar aspect of her right foot. She states she's going to trip to [place] soon and would like to have this taken care of.  Objective: Vital signs stable alert and oriented 3. Pulses are palpable. Callus sub-third metatarsal head of the right foot present. Pulses are palpable pain. Porokeratotic lesion sub-third metatarsal.  Plan: Debrided reactive hyperkeratosis for her today and enucleated the lesion. Encouraged her to soak in Epsom salts and warm water and I did place small amount of silver nitrate for local bleeding. Follow-up with me as needed Y for signs and symptoms of infection.

## 2015-08-15 DIAGNOSIS — I10 Essential (primary) hypertension: Secondary | ICD-10-CM | POA: Diagnosis not present

## 2015-08-15 DIAGNOSIS — J42 Unspecified chronic bronchitis: Secondary | ICD-10-CM | POA: Diagnosis not present

## 2015-08-15 DIAGNOSIS — Z0000 Encounter for general adult medical examination without abnormal findings: Secondary | ICD-10-CM | POA: Diagnosis not present

## 2015-08-15 DIAGNOSIS — E7800 Pure hypercholesterolemia, unspecified: Secondary | ICD-10-CM | POA: Diagnosis not present

## 2015-08-15 DIAGNOSIS — M25512 Pain in left shoulder: Secondary | ICD-10-CM | POA: Diagnosis not present

## 2015-08-15 DIAGNOSIS — I639 Cerebral infarction, unspecified: Secondary | ICD-10-CM | POA: Diagnosis not present

## 2015-08-16 DIAGNOSIS — M25512 Pain in left shoulder: Secondary | ICD-10-CM | POA: Diagnosis not present

## 2015-09-04 DIAGNOSIS — M7582 Other shoulder lesions, left shoulder: Secondary | ICD-10-CM | POA: Diagnosis not present

## 2015-09-06 DIAGNOSIS — E1165 Type 2 diabetes mellitus with hyperglycemia: Secondary | ICD-10-CM | POA: Diagnosis not present

## 2015-09-06 DIAGNOSIS — N183 Chronic kidney disease, stage 3 (moderate): Secondary | ICD-10-CM | POA: Diagnosis not present

## 2015-09-06 DIAGNOSIS — I10 Essential (primary) hypertension: Secondary | ICD-10-CM | POA: Diagnosis not present

## 2015-09-06 DIAGNOSIS — Z794 Long term (current) use of insulin: Secondary | ICD-10-CM | POA: Diagnosis not present

## 2015-09-06 DIAGNOSIS — Z79899 Other long term (current) drug therapy: Secondary | ICD-10-CM | POA: Diagnosis not present

## 2015-09-06 DIAGNOSIS — E1122 Type 2 diabetes mellitus with diabetic chronic kidney disease: Secondary | ICD-10-CM | POA: Diagnosis not present

## 2015-09-06 DIAGNOSIS — R7989 Other specified abnormal findings of blood chemistry: Secondary | ICD-10-CM | POA: Diagnosis not present

## 2015-09-08 DIAGNOSIS — G4733 Obstructive sleep apnea (adult) (pediatric): Secondary | ICD-10-CM | POA: Diagnosis not present

## 2015-09-12 ENCOUNTER — Ambulatory Visit (INDEPENDENT_AMBULATORY_CARE_PROVIDER_SITE_OTHER): Payer: PPO | Admitting: Neurology

## 2015-09-12 ENCOUNTER — Encounter: Payer: Self-pay | Admitting: Neurology

## 2015-09-12 VITALS — BP 130/68 | HR 91 | Ht 67.0 in | Wt 203.4 lb

## 2015-09-12 DIAGNOSIS — E538 Deficiency of other specified B group vitamins: Secondary | ICD-10-CM

## 2015-09-12 DIAGNOSIS — E0842 Diabetes mellitus due to underlying condition with diabetic polyneuropathy: Secondary | ICD-10-CM | POA: Diagnosis not present

## 2015-09-12 DIAGNOSIS — I6350 Cerebral infarction due to unspecified occlusion or stenosis of unspecified cerebral artery: Secondary | ICD-10-CM

## 2015-09-12 DIAGNOSIS — F17200 Nicotine dependence, unspecified, uncomplicated: Secondary | ICD-10-CM

## 2015-09-12 DIAGNOSIS — I6381 Other cerebral infarction due to occlusion or stenosis of small artery: Secondary | ICD-10-CM

## 2015-09-12 MED ORDER — CYANOCOBALAMIN 1000 MCG/ML IJ SOLN: 1000.0000 ug | Freq: Once | INTRAMUSCULAR | Status: DC

## 2015-09-12 MED ORDER — CYANOCOBALAMIN 1000 MCG/ML IJ SOLN
1000.0000 ug | Freq: Once | INTRAMUSCULAR | Status: AC
  Administered 2015-09-12: 1000 ug via INTRAMUSCULAR

## 2015-09-12 MED ORDER — "SYRINGE 23G X 1"" 3 ML MISC": 1.0000 | Freq: Once | Status: DC

## 2015-09-12 NOTE — Progress Notes (Signed)
[facility] Clinic Note - Initial Visit   Date: 09/12/2015  [name] MRN: [id number] DOB: Aug 14, 1945   Dear Dr. Kary Kos:  Thank you for your kind referral of Mckenzie Lawrence for consultation of numbness. Although her history is well known to you, please allow [name] to reiterate it for the purpose of our medical record. The patient was accompanied to the clinic by husband who also provides collateral information.     History of Present Illness: Mckenzie Lawrence is a 70 y.o. right-handed Caucasian female with hypertension, tobacco use, hyperlipidemia, insulin-dependent diabetes, and left thalamic lacunar stroke (02/2015) presenting for evaluation of generalized fatigue and right sided paresthesias.    She had a left thalamic lacunar infarct in September 2016 with residual right face, hand, and leg tingling.  Mechanism of stroke is thought to be hypertensive as her blood pressure was 228/164 on arrival.  Her antiplatelet therapy was switched from aspirin to plavix and she was started on metoprolol for blood pressure control.  She was seeing Dr. Manuella Ghazi as hospital discharge stroke follow-up.  Because of her numbness/tingling over the right three fingers, face, tongue, lip he offered a trial of gabapentin 349m BID and eventually 6049mBID but becomes very unsteady and tapered the medication.   She has been very extremely tired and endorses generalized weakness. She is very active socially and reports that she was so tired last week on her birthday that she stayed home and did not meet friends for lunch.  She was tested for OSA and even though her there as not evidence of OSA on her sleep study, but due to her cardiovascular risk factors, CPAP was recommended.  She feels that her sleep is worse with the CPAP and endorses tripping over the tubing twice.   Her labs indicate vitamin B12 deficiency but she has not taken injections.  She takes oral vitamin B12 100052mdaily.    She has at least 15 year history of diabetes and 10 year history of diabetic neuropathy.  She has constant numbness of the feet and recently some imbalance.  She denies painful paresthesias or weakness.  She walks unassisted.   Out-side paper records, electronic medical record, and images have been reviewed where available and summarized as:  MRI brain wo contrast 02/2015 - Small acute to subacute lacunar infarct at or near the posterior limb left internal capsule, Mild chronic small vessel ischemia in the region, with a nearby chronic lacune of the left thalamus. MRI Brain wo contrast 05/22/15:  Small remote stroke in left thalamus, mild atrophy and white matter changes.  EEG 06/15/15:  normal.  48 hour Holter: negative except infrequent premature atrial contractions. Extracranial US:[place]ateral carotid bifurcation plaque, left greater than right, resulting in less than 50% diameter stenosis. ECHO: 02/2015:  EF 65-70%, no source of emboli.  Labs 05/22/2015:  HDL 37, LDL 53, Chol 131   Labs 03/14/2015:  TSH 2.203, ESR 52, CRP 1.4, vitamin B6 6.1, HbA1c 7.0, vitamin B12 197**,  Lab Results  Component Value Date   CHOL 146 03/16/2015   HDL 34* 03/16/2015   LDLCALC 66 03/16/2015   TRIG 229* 03/16/2015   CHOLHDL 4.3 03/16/2015   Lab Results  Component Value Date   HGBA1C 6.5* 03/16/2015     Past Medical History  Diagnosis Date  . PCOS (polycystic ovarian syndrome)   . Diabetes mellitus     insulin dependent  . Osteopenia 08/2014    T score -2.1 stable from  prior Dexa's  . HLD (hyperlipidemia)   . Cataracts, bilateral   . Hypertension   . Broken foot   . Hx of cardiovascular stress test     a. ETT (6/13):  Ex 5:13; no ischemic changes  . Personal history of tobacco use, presenting hazards to health 01/09/2015  . Lacunar stroke of left subthalamic region [facility]) 02/2015    Past Surgical History  Procedure Laterality Date  . Vaginal hysterectomy  1979  . Knee surgery    . Hand  surgery    . Labial abscess    . Oophorectomy      BSO  . Pubo vag sling    . Groin abscess    . Foot surgery    . Shoulder surgery    . Cataract extraction       Medications:  Outpatient Encounter Prescriptions as of 09/12/2015  Medication Sig  . amLODipine (NORVASC) 5 MG tablet Take 5 mg by mouth daily.  . Cholecalciferol (VITAMIN D3) 3000 units TABS Take 2,000 Units by mouth.  . clopidogrel (PLAVIX) 75 MG tablet Take 1 tablet (75 mg total) by mouth daily.  Marland Kitchen imipramine (TOFRANIL) 25 MG tablet Take 50 mg by mouth at bedtime.   . Insulin Detemir (LEVEMIR [place]) Inject 40 Units into the skin daily before breakfast.   . metFORMIN (GLUCOPHAGE) 1000 MG tablet Take 1,000 mg by mouth 2 (two) times daily with a meal.  . metoprolol succinate (TOPROL-XL) 50 MG 24 hr tablet Take 1 tablet (50 mg total) by mouth daily.  . nicotine (NICOTROL) 10 MG inhaler Inhale 1 cartridge (1 continuous puffing total) into the lungs as needed for smoking cessation.  Marland Kitchen oxybutynin (DITROPAN-XL) 5 MG 24 hr tablet Take 5 mg by mouth at bedtime.  . pravastatin (PRAVACHOL) 40 MG tablet Take 40 mg by mouth at bedtime.   Marland Kitchen zolpidem (AMBIEN) 10 MG tablet Take 1 tablet (10 mg total) by mouth at bedtime as needed. for sleep  . [DISCONTINUED] aspirin 81 MG tablet Take 81 mg by mouth at bedtime.   . [DISCONTINUED] vitamin B-12 (CYANOCOBALAMIN) 1000 MCG tablet Take 1,000 mcg by mouth daily.  . cyanocobalamin (,VITAMIN B-12,) 1000 MCG/ML injection Inject 1 mL (1,000 mcg total) into the muscle once. 1 ml IM daily x 7 days then 1 ml IM weekly x 4 weeks then 1 ml IM monthly x 1 year  . Syringe/Needle, Disp, (SYRINGE 3CC/23GX1") 23G X 1" 3 ML MISC 1 Package by Does not apply route once.  . [DISCONTINUED] gabapentin (NEURONTIN) 300 MG capsule Take 1 capsule (300 mg total) by mouth 2 (two) times daily.  . [EXPIRED] cyanocobalamin ((VITAMIN B-12)) injection 1,000 mcg    No facility-administered encounter medications on file as of  09/12/2015.     Allergies:  Allergies  Allergen Reactions  . Iodine Anaphylaxis    Ingested iodine only  . Shellfish Allergy Anaphylaxis  . Codeine Nausea And Vomiting  . Irbesartan Other (See Comments)  . Morphine Sulfate Other (See Comments)  . Sulfa Antibiotics Other (See Comments)    Fever    Family History: Family History  Problem Relation Age of Onset  . Hypertension Mother   . Heart disease Mother 49    MI  . Diabetes Father   . Diabetes Sister   . Hypertension Sister   . Diabetes Brother   . Hypertension Brother   . Heart disease Brother 78    CAD  . Cancer Sister     Multiple myloma  .  Diabetes Brother     Social History: Social History  Substance Use Topics  . Smoking status: Current Every Day Smoker -- 1.50 packs/day for 50 years    Types: Cigarettes  . Smokeless tobacco: Never Used     Comment: 3ppd x 10 year, then cut back to 1.5pdd since 02/2015  . Alcohol Use: 0.0 oz/week    0 Standard drinks or equivalent per week     Comment: Rare   Social History   Social History Narrative   Lives at home with husband in a one story home.  Has 1 daughter.     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     Education: some college.    Review of Systems:  CONSTITUTIONAL: No fevers, chills, night sweats, or weight loss.  + malaise EYES: No visual changes or eye pain ENT: No hearing changes.  No history of nose bleeds.   RESPIRATORY: No cough, wheezing and shortness of breath.   CARDIOVASCULAR: Negative for chest pain, and palpitations.   GI: Negative for abdominal discomfort, blood in stools or black stools.  No recent change in bowel habits.   GU:  No history of incontinence.   MUSCLOSKELETAL: +history of joint pain or swelling.  No myalgias.   SKIN: Negative for lesions, rash, and itching.   HEMATOLOGY/ONCOLOGY: Negative for prolonged bleeding, bruising easily, and swollen nodes.  No history of cancer.   ENDOCRINE: Negative for cold or heat  intolerance, polydipsia or goiter.   PSYCH:  +depression or anxiety symptoms.   NEURO: As Above.   Vital Signs:  BP 130/68 mmHg  Pulse 91  Ht '5\' 7"'  (1.702 m)  Wt 203 lb 6 oz (92.25 kg)  BMI 31.85 kg/m2  SpO2 97%   General Medical Exam:   General:  Well appearing, comfortable.   Eyes/ENT: see cranial nerve examination.   Neck: No masses appreciated.  Full range of motion without tenderness.  No carotid bruits. Respiratory:  Clear to auscultation, good air entry bilaterally.   Cardiac:  Regular rate and rhythm, no murmur.   Extremities:  No deformities, edema, or skin discoloration.  Skin:  No rashes or lesions.  Neurological Exam: MENTAL STATUS including orientation to time, place, person, recent and remote memory, attention span and concentration, language, and fund of knowledge is normal.  Speech is not dysarthric.  CRANIAL NERVES: II:  No visual field defects.  Unremarkable fundi.   III-IV-VI: Pupils equal round and reactive to light.  Normal conjugate, extra-ocular eye movements in all directions of gaze.  No nystagmus.  No ptosis.   V:  Normal facial sensation.   VII:  Normal facial symmetry and movements.    VIII:  Normal hearing and vestibular function.   IX-X:  Normal palatal movement.   XI:  Normal shoulder shrug and head rotation.   XII:  Normal tongue strength and range of motion, no deviation or fasciculation.  MOTOR:  No atrophy, fasciculations or abnormal movements.  No pronator drift.  Tone is normal.    Right Upper Extremity:    Left Upper Extremity:    Deltoid  5/5   Deltoid  5/5   Biceps  5/5   Biceps  5/5   Triceps  5/5   Triceps  5/5   Wrist extensors  5/5   Wrist extensors  5/5   Wrist flexors  5/5   Wrist flexors  5/5   Finger extensors  5/5   Finger extensors  5/5   Finger  flexors  5/5   Finger flexors  5/5   Dorsal interossei  5/5   Dorsal interossei  5/5   Abductor pollicis  5/5   Abductor pollicis  5/5   Tone (Ashworth scale)  0  Tone (Ashworth  scale)  0   Right Lower Extremity:    Left Lower Extremity:    Hip flexors  5/5   Hip flexors  5/5   Hip extensors  5/5   Hip extensors  5/5   Knee flexors  5/5   Knee flexors  5/5   Knee extensors  5/5   Knee extensors  5/5   Dorsiflexors  5/5   Dorsiflexors  5/5   Plantarflexors  5/5   Plantarflexors  5/5   Toe extensors  5/5   Toe extensors  5/5   Toe flexors  4/5   Toe flexors  4/5   Tone (Ashworth scale)  0  Tone (Ashworth scale)  0   MSRs:  Right                                                                 Left brachioradialis 3+  brachioradialis 2+  biceps 3+  biceps 2+  triceps 3+  triceps 2+  patellar 2+  patellar 2+  ankle jerk 0  ankle jerk 2+  Hoffman no  Hoffman 0  plantar response down  plantar response down   SENSORY:  Reduced vibration and temperature distal to ankles bilaterally. Sensory preserved to all modalities in the upper extremities.  There is mild sway with Rhomberg testing.   COORDINATION/GAIT: Normal finger-to- nose-finger.  Intact rapid alternating movements bilaterally.  Able to rise from a chair without using arms.  Gait narrow based and stable. Stressed gait intact. She is unsteady with tandem gait.    IMPRESSION/PLAN: 1.  Left thalamic stroke (02/2015) with residual right hemisensory loss (face, hand, leg)  - Etiology is most likely hypertension and small vessel disease.  No large vessel occlusion, arrhythmias, or cardioembolic events  - Risk factors:  Diabetes (HbA1c 7.0), hypertension (normotensive), hyperlipidemia (well-controlled, LDL 53), significant tobacco use!  - Extensive discussion regarding secondary stroke prevention to maintain blood sugars and stop smoking  - She has been on dual antiplatelet therapy for > 3 months, so would be reasonable to continue monotherapy with plavix 73m daily alone.  Discontinue aspirin.  - Continue statin therapy and blood pressure control  - Previously tried gabapentin for paresthesias, consider Cymbalta or  Lyrica going forward.  She is already on TCA  2.  Vitamin B12 deficiency most likely contributing to her generalized malaise and fatigue  - Start vitamin B12 10077m IM injection daily x 7 days, weekly x 4 weeks, then monthly thereafter x 1 year. First injection administered today  - Stop oral suppements  3.  Distal and symmetric diabetic polyneuropathy with predominately numbness, no painful paresthesias  - Encouraged to do balance exercises at the gym  - If there is worsening imbalance, start physical therapy for balance training  - Recommend tight glycemic control  4.  Tobacco use.  Smoking cessation instruction/counseling given:  counseled patient on the dangers of tobacco use, advised patient to stop smoking, and reviewed strategies to maximize success  Return to clinic in 2 months  The duration of this appointment visit was 60 minutes of face-to-face time with the patient.  Greater than 50% of this time was spent in counseling, explanation of diagnosis, planning of further management, and coordination of care.   Thank you for allowing me to participate in patient's care.  If I can answer any additional questions, I would be pleased to do so.    Sincerely,    Donika K. Posey Pronto, DO

## 2015-09-12 NOTE — Progress Notes (Signed)
Note routed

## 2015-09-12 NOTE — Patient Instructions (Addendum)
1.  Start Vitamin B12 1022mcg IM injection daily x 7 days, weekly x 4 weeks, then monthly thereafter x 1 year. 2.  Stop aspirin 3.  Continue plavix 75mg  daily 4.  Strongly recommended quitting smoking  Return to clinic 2 months  You will always be at an increased risk of stroke.  Stroke is a medical emergency.  Know these warning signs of stroke. Every second counts:  Sudden numbness or weakness of the face, arm or leg, especially on one side of the body,  Sudden confusion, trouble speaking or understanding,  Sudden trouble seeing in one eye or both eyes,  Sudden trouble walking, dizziness, loss of balance or coordination,  Sudden severe headache with no known cause.  If you or someone with you has one or more of these signs, don't delay!  Immediately call [id number], or the emergency medical services (EMS) number so an ambulance (ideally with advanced life support) can be sent for you.  Also, check the time so that you will know when the symptoms first appeared. It's very important to take immediate action. Medical treatment may be available if action is taken early enough.   General guidelines for stroke risk factor management, if present  Hypertension  Target blood pressure <140/90, <130/80 for high risk; normal is 120/80  Hyperlipidemia  Target total cholesterol < 200  Target LDL <100, < 70 for high risk  Target HDL >45 for men, >55 for women  Target triglycerides <150  Diabetes  Target HgbA1c <7%  Smoking  Target is smoking cessation  Physical inactivity  Target is exercise at least 3 times per week  Target waist circumference, in inches is <35 for women and <40 for men

## 2015-09-18 DIAGNOSIS — G4733 Obstructive sleep apnea (adult) (pediatric): Secondary | ICD-10-CM | POA: Diagnosis not present

## 2015-09-19 DIAGNOSIS — Z79899 Other long term (current) drug therapy: Secondary | ICD-10-CM | POA: Diagnosis not present

## 2015-09-19 DIAGNOSIS — N390 Urinary tract infection, site not specified: Secondary | ICD-10-CM | POA: Diagnosis not present

## 2015-09-25 ENCOUNTER — Emergency Department: Payer: PPO

## 2015-09-25 ENCOUNTER — Inpatient Hospital Stay
Admission: EM | Admit: 2015-09-25 | Discharge: 2015-09-27 | DRG: 641 | Disposition: A | Discharge status: Home / Self Care | Payer: PPO | Attending: Internal Medicine | Admitting: Internal Medicine

## 2015-09-25 ENCOUNTER — Encounter: Payer: Self-pay | Admitting: Radiology

## 2015-09-25 DIAGNOSIS — R112 Nausea with vomiting, unspecified: Secondary | ICD-10-CM | POA: Diagnosis not present

## 2015-09-25 DIAGNOSIS — Z809 Family history of malignant neoplasm, unspecified: Secondary | ICD-10-CM | POA: Diagnosis not present

## 2015-09-25 DIAGNOSIS — M8580 Other specified disorders of bone density and structure, unspecified site: Secondary | ICD-10-CM | POA: Diagnosis not present

## 2015-09-25 DIAGNOSIS — Z885 Allergy status to narcotic agent status: Secondary | ICD-10-CM | POA: Diagnosis not present

## 2015-09-25 DIAGNOSIS — E119 Type 2 diabetes mellitus without complications: Secondary | ICD-10-CM | POA: Diagnosis not present

## 2015-09-25 DIAGNOSIS — H269 Unspecified cataract: Secondary | ICD-10-CM | POA: Diagnosis present

## 2015-09-25 DIAGNOSIS — R42 Dizziness and giddiness: Secondary | ICD-10-CM | POA: Diagnosis not present

## 2015-09-25 DIAGNOSIS — Z833 Family history of diabetes mellitus: Secondary | ICD-10-CM

## 2015-09-25 DIAGNOSIS — I10 Essential (primary) hypertension: Secondary | ICD-10-CM | POA: Diagnosis not present

## 2015-09-25 DIAGNOSIS — Z882 Allergy status to sulfonamides status: Secondary | ICD-10-CM | POA: Diagnosis not present

## 2015-09-25 DIAGNOSIS — R32 Unspecified urinary incontinence: Secondary | ICD-10-CM | POA: Diagnosis not present

## 2015-09-25 DIAGNOSIS — Z888 Allergy status to other drugs, medicaments and biological substances status: Secondary | ICD-10-CM

## 2015-09-25 DIAGNOSIS — Z79899 Other long term (current) drug therapy: Secondary | ICD-10-CM | POA: Diagnosis not present

## 2015-09-25 DIAGNOSIS — Z7902 Long term (current) use of antithrombotics/antiplatelets: Secondary | ICD-10-CM | POA: Diagnosis not present

## 2015-09-25 DIAGNOSIS — Z794 Long term (current) use of insulin: Secondary | ICD-10-CM | POA: Diagnosis not present

## 2015-09-25 DIAGNOSIS — Z91013 Allergy to seafood: Secondary | ICD-10-CM

## 2015-09-25 DIAGNOSIS — I639 Cerebral infarction, unspecified: Secondary | ICD-10-CM | POA: Diagnosis not present

## 2015-09-25 DIAGNOSIS — E871 Hypo-osmolality and hyponatremia: Principal | ICD-10-CM | POA: Diagnosis present

## 2015-09-25 DIAGNOSIS — E8770 Fluid overload, unspecified: Secondary | ICD-10-CM | POA: Diagnosis not present

## 2015-09-25 DIAGNOSIS — F17210 Nicotine dependence, cigarettes, uncomplicated: Secondary | ICD-10-CM | POA: Diagnosis not present

## 2015-09-25 DIAGNOSIS — R531 Weakness: Secondary | ICD-10-CM | POA: Diagnosis not present

## 2015-09-25 DIAGNOSIS — E1142 Type 2 diabetes mellitus with diabetic polyneuropathy: Secondary | ICD-10-CM | POA: Diagnosis not present

## 2015-09-25 DIAGNOSIS — E538 Deficiency of other specified B group vitamins: Secondary | ICD-10-CM | POA: Diagnosis present

## 2015-09-25 DIAGNOSIS — Z8673 Personal history of transient ischemic attack (TIA), and cerebral infarction without residual deficits: Secondary | ICD-10-CM

## 2015-09-25 DIAGNOSIS — E7800 Pure hypercholesterolemia, unspecified: Secondary | ICD-10-CM | POA: Diagnosis not present

## 2015-09-25 DIAGNOSIS — J449 Chronic obstructive pulmonary disease, unspecified: Secondary | ICD-10-CM | POA: Diagnosis present

## 2015-09-25 DIAGNOSIS — E282 Polycystic ovarian syndrome: Secondary | ICD-10-CM | POA: Diagnosis present

## 2015-09-25 DIAGNOSIS — E785 Hyperlipidemia, unspecified: Secondary | ICD-10-CM | POA: Diagnosis present

## 2015-09-25 DIAGNOSIS — R930 Abnormal findings on diagnostic imaging of skull and head, not elsewhere classified: Secondary | ICD-10-CM | POA: Diagnosis not present

## 2015-09-25 DIAGNOSIS — Z8249 Family history of ischemic heart disease and other diseases of the circulatory system: Secondary | ICD-10-CM | POA: Diagnosis not present

## 2015-09-25 DIAGNOSIS — J410 Simple chronic bronchitis: Secondary | ICD-10-CM | POA: Diagnosis not present

## 2015-09-25 LAB — BASIC METABOLIC PANEL
ANION GAP: 12 (ref 5–15)
BUN: 16 mg/dL (ref 6–20)
CALCIUM: 9.4 mg/dL (ref 8.9–10.3)
CHLORIDE: 83 mmol/L — AB (ref 101–111)
CO2: 24 mmol/L (ref 22–32)
Creatinine, Ser: 1.31 mg/dL — ABNORMAL HIGH (ref 0.44–1.00)
GFR calc non Af Amer: 41 mL/min — ABNORMAL LOW (ref >60)
GFR, EST AFRICAN AMERICAN: 47 mL/min — AB (ref >60)
Glucose, Bld: 156 mg/dL — ABNORMAL HIGH (ref 65–99)
POTASSIUM: 4.4 mmol/L (ref 3.5–5.1)
Sodium: 119 mmol/L — CL (ref 135–145)

## 2015-09-25 LAB — CBC
HEMATOCRIT: 38.4 % (ref 35.0–47.0)
HEMOGLOBIN: 13.4 g/dL (ref 12.0–16.0)
MCH: 30.9 pg (ref 26.0–34.0)
MCHC: 34.9 g/dL (ref 32.0–36.0)
MCV: 88.7 fL (ref 80.0–100.0)
Platelets: 354 10*3/uL (ref 150–440)
RBC: 4.33 MIL/uL (ref 3.80–5.20)
RDW: 13.1 % (ref 11.5–14.5)
WBC: 14.9 10*3/uL — ABNORMAL HIGH (ref 3.6–11.0)

## 2015-09-25 LAB — URINALYSIS COMPLETE WITH MICROSCOPIC (ARMC ONLY)
Bilirubin Urine: NEGATIVE
Glucose, UA: NEGATIVE mg/dL
Hgb urine dipstick: NEGATIVE
KETONES UR: NEGATIVE mg/dL
Leukocytes, UA: NEGATIVE
Nitrite: NEGATIVE
PH: 6 (ref 5.0–8.0)
PROTEIN: NEGATIVE mg/dL
RBC / HPF: NONE SEEN RBC/hpf (ref 0–5)
SPECIFIC GRAVITY, URINE: 1.006 (ref 1.005–1.030)

## 2015-09-25 LAB — GLUCOSE, CAPILLARY
GLUCOSE-CAPILLARY: 145 mg/dL — AB (ref 65–99)
GLUCOSE-CAPILLARY: 236 mg/dL — AB (ref 65–99)
Glucose-Capillary: 207 mg/dL — ABNORMAL HIGH (ref 65–99)

## 2015-09-25 LAB — OSMOLALITY, URINE: Osmolality, Ur: 244 mOsm/kg — ABNORMAL LOW (ref 300–900)

## 2015-09-25 LAB — OSMOLALITY: OSMOLALITY: 258 mosm/kg — AB (ref 275–295)

## 2015-09-25 LAB — TSH: TSH: 2.817 u[IU]/mL (ref 0.350–4.500)

## 2015-09-25 MED ORDER — ENOXAPARIN SODIUM 40 MG/0.4ML ~~LOC~~ SOLN
40.0000 mg | SUBCUTANEOUS | Status: DC
  Administered 2015-09-25 – 2015-09-26 (×2): 40 mg via SUBCUTANEOUS
  Filled 2015-09-25 (×2): qty 0.4

## 2015-09-25 MED ORDER — ACETAMINOPHEN 650 MG RE SUPP: 650.0000 mg | Freq: Four times a day (QID) | RECTAL | Status: DC | PRN

## 2015-09-25 MED ORDER — LOSARTAN POTASSIUM 50 MG PO TABS
100.0000 mg | ORAL_TABLET | Freq: Every day | ORAL | Status: DC
  Administered 2015-09-26 – 2015-09-27 (×2): 100 mg via ORAL
  Filled 2015-09-25 (×2): qty 2

## 2015-09-25 MED ORDER — SODIUM CHLORIDE 0.9 % IV SOLN
INTRAVENOUS | Status: DC
  Administered 2015-09-25 – 2015-09-27 (×4): via INTRAVENOUS

## 2015-09-25 MED ORDER — VITAMIN B-12 1000 MCG PO TABS
1000.0000 ug | ORAL_TABLET | Freq: Every day | ORAL | Status: DC
  Administered 2015-09-26 – 2015-09-27 (×2): 1000 ug via ORAL
  Filled 2015-09-25 (×2): qty 1

## 2015-09-25 MED ORDER — INSULIN ASPART 100 UNIT/ML ~~LOC~~ SOLN
0.0000 [IU] | Freq: Three times a day (TID) | SUBCUTANEOUS | Status: DC
  Administered 2015-09-26 – 2015-09-27 (×2): 1 [IU] via SUBCUTANEOUS
  Filled 2015-09-25 (×3): qty 1

## 2015-09-25 MED ORDER — INSULIN DETEMIR 100 UNIT/ML ~~LOC~~ SOLN
50.0000 [IU] | Freq: Every morning | SUBCUTANEOUS | Status: DC
  Administered 2015-09-26 – 2015-09-27 (×2): 50 [IU] via SUBCUTANEOUS
  Filled 2015-09-25 (×2): qty 0.5

## 2015-09-25 MED ORDER — ACETAMINOPHEN 325 MG PO TABS: 650.0000 mg | ORAL_TABLET | Freq: Four times a day (QID) | ORAL | Status: DC | PRN

## 2015-09-25 MED ORDER — NICOTINE POLACRILEX 2 MG MT GUM
2.0000 mg | CHEWING_GUM | OROMUCOSAL | Status: DC | PRN
  Filled 2015-09-25: qty 1

## 2015-09-25 MED ORDER — INSULIN DETEMIR 100 UNIT/ML ~~LOC~~ SOLN
58.0000 [IU] | Freq: Every evening | SUBCUTANEOUS | Status: DC
  Filled 2015-09-25 (×3): qty 0.58

## 2015-09-25 MED ORDER — OXYBUTYNIN CHLORIDE 5 MG PO TABS
5.0000 mg | ORAL_TABLET | Freq: Every day | ORAL | Status: DC
  Administered 2015-09-26 – 2015-09-27 (×2): 5 mg via ORAL
  Filled 2015-09-25 (×2): qty 1

## 2015-09-25 MED ORDER — CLOPIDOGREL BISULFATE 75 MG PO TABS
75.0000 mg | ORAL_TABLET | Freq: Every day | ORAL | Status: DC
  Administered 2015-09-26: 75 mg via ORAL
  Filled 2015-09-25: qty 1

## 2015-09-25 MED ORDER — INSULIN ASPART 100 UNIT/ML ~~LOC~~ SOLN
0.0000 [IU] | Freq: Every day | SUBCUTANEOUS | Status: DC
  Administered 2015-09-25: 2 [IU] via SUBCUTANEOUS
  Filled 2015-09-25: qty 2

## 2015-09-25 MED ORDER — ONDANSETRON HCL 4 MG PO TABS: 4.0000 mg | ORAL_TABLET | Freq: Four times a day (QID) | ORAL | Status: DC | PRN

## 2015-09-25 MED ORDER — ZOLPIDEM TARTRATE 5 MG PO TABS
5.0000 mg | ORAL_TABLET | Freq: Every evening | ORAL | Status: DC | PRN
  Administered 2015-09-25 – 2015-09-26 (×2): 5 mg via ORAL
  Filled 2015-09-25 (×2): qty 1

## 2015-09-25 MED ORDER — SODIUM CHLORIDE 0.9 % IV SOLN: Freq: Once | INTRAVENOUS | Status: DC

## 2015-09-25 MED ORDER — ONDANSETRON HCL 4 MG/2ML IJ SOLN: 4.0000 mg | Freq: Four times a day (QID) | INTRAMUSCULAR | Status: DC | PRN

## 2015-09-25 MED ORDER — IMIPRAMINE HCL 50 MG PO TABS
50.0000 mg | ORAL_TABLET | Freq: Every day | ORAL | Status: DC
  Administered 2015-09-26 – 2015-09-27 (×2): 50 mg via ORAL
  Filled 2015-09-25 (×4): qty 1

## 2015-09-25 MED ORDER — NICOTINE 10 MG IN INHA
1.0000 | RESPIRATORY_TRACT | Status: DC | PRN
Start: 2015-09-25 — End: 2015-09-25
  Filled 2015-09-25: qty 36

## 2015-09-25 MED ORDER — METFORMIN HCL 500 MG PO TABS
1000.0000 mg | ORAL_TABLET | Freq: Two times a day (BID) | ORAL | Status: DC
  Administered 2015-09-26 – 2015-09-27 (×3): 1000 mg via ORAL
  Filled 2015-09-25 (×3): qty 2

## 2015-09-25 MED ORDER — PRAVASTATIN SODIUM 20 MG PO TABS
40.0000 mg | ORAL_TABLET | Freq: Every day | ORAL | Status: DC
  Administered 2015-09-26: 40 mg via ORAL
  Filled 2015-09-25: qty 2

## 2015-09-25 MED ORDER — METOPROLOL SUCCINATE ER 50 MG PO TB24
50.0000 mg | ORAL_TABLET | Freq: Every day | ORAL | Status: DC
  Administered 2015-09-26 – 2015-09-27 (×2): 50 mg via ORAL
  Filled 2015-09-25 (×2): qty 1

## 2015-09-25 MED ORDER — AMLODIPINE BESYLATE 5 MG PO TABS
5.0000 mg | ORAL_TABLET | Freq: Every day | ORAL | Status: DC
  Administered 2015-09-25 – 2015-09-26 (×2): 5 mg via ORAL
  Filled 2015-09-25 (×2): qty 1

## 2015-09-25 NOTE — ED Notes (Addendum)
Patient presents to the ED with increasing weakness x 1 week.  Patient states, "the weakness comes in waves."  Patient reports that her weakness reminds her of when she had a stroke in the past.  Patient was seen by her cardiologist today and was sent to the ED.  Patient reports nausea and vomiting multiple episodes today.

## 2015-09-25 NOTE — ED Provider Notes (Signed)
[facility] Emergency Department Provider Note  ____________________________________________   I have reviewed the triage vital signs and the nursing notes.   HISTORY  Chief Complaint Weakness    HPI Mckenzie Lawrence is a 70 y.o. female states that she has been for the last 6 weeks getting gradually progressively more weak diffusely. She does have a history of old CVA with "tingling" in her right tongue and in the right fingers however that is not changed. She denies any focal neurologic deficit that is new. She states however that she has decreased energy and feels no urge to get up and go out. She denies any vomiting until today when she had a few ounces of nonbloody nonbilious vomiting. She had no hematuria, she's had no melena no bright red blood per rectum no dysuria. She denies cough or shortness of breath. She denies chest pain. She states that nothing makes his symptoms better or worse. She just feels that she has no energy for 6 weeks at least. She has seen several doctors including her neurologist but no one has told her exactly why this should be. She does not believe blood work is performed. Patient does not drink significant amount of water she states she had two 16 ounce bottles of water yesterday. She has had some weight loss.   Past Medical History  Diagnosis Date  . PCOS (polycystic ovarian syndrome)   . Diabetes mellitus     insulin dependent  . Osteopenia 08/2014    T score -2.1 stable from prior Dexa's  . HLD (hyperlipidemia)   . Cataracts, bilateral   . Hypertension   . Broken foot   . Hx of cardiovascular stress test     a. ETT (6/13):  Ex 5:13; no ischemic changes  . Personal history of tobacco use, presenting hazards to health 01/09/2015  . Lacunar stroke of left subthalamic region [facility]) 02/2015    Patient Active Problem List   Diagnosis Date Noted  . Vitamin B12 deficiency 09/12/2015  . Lacunar stroke of left subthalamic region ([place])  09/12/2015  . Tobacco use disorder 09/12/2015  . Diabetic polyneuropathy associated with diabetes mellitus due to underlying condition ([place]) 09/12/2015  . Stroke ([place]) 03/15/2015  . Accelerated hypertension 03/15/2015  . Personal history of tobacco use, presenting hazards to health 01/09/2015  . Syncope 12/04/2011  . Tobacco abuse 12/04/2011  . Elevated cholesterol   . Cataracts, bilateral   . Hypertension   . Broken foot   . PCOS (polycystic ovarian syndrome)   . Type II diabetes mellitus ([place])   . Osteopenia     Past Surgical History  Procedure Laterality Date  . Vaginal hysterectomy  1979  . Knee surgery    . Hand surgery    . Labial abscess    . Oophorectomy      BSO  . Pubo vag sling    . Groin abscess    . Foot surgery    . Shoulder surgery    . Cataract extraction      Current Outpatient Rx  Name  Route  Sig  Dispense  Refill  . amLODipine (NORVASC) 5 MG tablet   Oral   Take 5 mg by mouth daily.         . Cholecalciferol (VITAMIN D3) 3000 units TABS   Oral   Take 2,000 Units by mouth.         . clopidogrel (PLAVIX) 75 MG tablet   Oral   Take 1 tablet (  75 mg total) by mouth daily.   60 tablet   1   . cyanocobalamin (,VITAMIN B-12,) 1000 MCG/ML injection   Intramuscular   Inject 1 mL (1,000 mcg total) into the muscle once. 1 ml IM daily x 7 days then 1 ml IM weekly x 4 weeks then 1 ml IM monthly x 1 year   25 mL   0   . imipramine (TOFRANIL) 25 MG tablet   Oral   Take 50 mg by mouth at bedtime.          . Insulin Detemir (LEVEMIR [place])   Subcutaneous   Inject 40 Units into the skin daily before breakfast.          . metFORMIN (GLUCOPHAGE) 1000 MG tablet   Oral   Take 1,000 mg by mouth 2 (two) times daily with a meal.         . metoprolol succinate (TOPROL-XL) 50 MG 24 hr tablet   Oral   Take 1 tablet (50 mg total) by mouth daily.   30 tablet   1   . nicotine (NICOTROL) 10 MG inhaler   Inhalation   Inhale 1 cartridge (1 continuous  puffing total) into the lungs as needed for smoking cessation.   42 each   0   . oxybutynin (DITROPAN-XL) 5 MG 24 hr tablet   Oral   Take 5 mg by mouth at bedtime.         . pravastatin (PRAVACHOL) 40 MG tablet   Oral   Take 40 mg by mouth at bedtime.          . Syringe/Needle, Disp, (SYRINGE 3CC/23GX1") 23G X 1" 3 ML MISC   Does not apply   1 Package by Does not apply route once.   50 each   0   . zolpidem (AMBIEN) 10 MG tablet   Oral   Take 1 tablet (10 mg total) by mouth at bedtime as needed. for sleep   30 tablet   3     FOR FUTURE REFILLS     Allergies Iodine; Shellfish allergy; Codeine; Irbesartan; Morphine sulfate; and Sulfa antibiotics  Family History  Problem Relation Age of Onset  . Hypertension Mother   . Heart disease Mother 88    MI  . Diabetes Father   . Diabetes Sister   . Hypertension Sister   . Diabetes Brother   . Hypertension Brother   . Heart disease Brother 68    CAD  . Cancer Sister     Multiple myloma  . Diabetes Brother     Social History Social History  Substance Use Topics  . Smoking status: Current Every Day Smoker -- 1.50 packs/day for 50 years    Types: Cigarettes  . Smokeless tobacco: Never Used     Comment: 3ppd x 10 year, then cut back to 1.5pdd since 02/2015  . Alcohol Use: 0.0 oz/week    0 Standard drinks or equivalent per week     Comment: Rare    Review of Systems Constitutional: No fever/chills Eyes: No visual changes. ENT: No sore throat. No stiff neck no neck pain Cardiovascular: Denies chest pain. Respiratory: Denies shortness of breath. Gastrointestinal:  Had vomiting earlier today..  No diarrhea.  No constipation. Genitourinary: Negative for dysuria. Musculoskeletal: Negative lower extremity swelling Skin: Negative for rash. Neurological: Negative for headaches, focal weakness or numbness. 10-point ROS otherwise negative.  ____________________________________________   PHYSICAL EXAM:  VITAL  SIGNS: ED Triage Vitals  Enc Vitals  Group     BP 09/25/15 1522 103/65 mmHg     Pulse Rate 09/25/15 1522 92     Resp 09/25/15 1522 18     Temp 09/25/15 1522 97.6 F (36.4 C)     Temp src --      SpO2 09/25/15 1522 98 %     Weight 09/25/15 1522 201 lb (91.173 kg)     Height 09/25/15 1522 5\' 8"  (1.727 m)     Head Cir --      Peak Flow --      Pain Score --      Pain Loc --      Pain Edu? --      Excl. in [place]? --     Constitutional: Alert and oriented. Well appearing and in no acute distress. Eyes: Conjunctivae are normal. PERRL. EOMI. Head: Atraumatic. Nose: No congestion/rhinnorhea. Mouth/Throat: Mucous membranes are moist.  Oropharynx non-erythematous. Neck: No stridor.   Nontender with no meningismus Cardiovascular: Normal rate, regular rhythm. Grossly normal heart sounds.  Good peripheral circulation. Respiratory: Normal respiratory effort.  No retractions. Lungs CTAB. Abdominal: Soft and nontender. No distention. No guarding no rebound Back:  There is no focal tenderness or step off there is no midline tenderness there are no lesions noted. there is no CVA tenderness Musculoskeletal: No lower extremity tenderness. No joint effusions, no DVT signs strong distal pulses no edema Neurologic:  Normal speech and language. No gross focal neurologic deficits are appreciated.  Skin:  Skin is warm, dry and intact. No rash noted. Psychiatric: Mood and affect are normal. Speech and behavior are normal.  ____________________________________________   LABS (all labs ordered are listed, but only abnormal results are displayed)  Labs Reviewed  BASIC METABOLIC PANEL - Abnormal; Notable for the following:    Sodium 119 (*)    Chloride 83 (*)    Glucose, Bld 156 (*)    Creatinine, Ser 1.31 (*)    GFR calc non Af Amer 41 (*)    GFR calc Af Amer 47 (*)    All other components within normal limits  CBC - Abnormal; Notable for the following:    WBC 14.9 (*)    All other components within  normal limits  GLUCOSE, CAPILLARY - Abnormal; Notable for the following:    Glucose-Capillary 145 (*)    All other components within normal limits  URINALYSIS COMPLETEWITH MICROSCOPIC ([facility] ONLY)  TSH  CBG MONITORING, ED   ____________________________________________  EKG  I personally interpreted any EKGs ordered by me or triage Normal sinus rhythm at 90 bpm no acute ST elevation or acute ST depression nonspecific ST changes unremarkable EKG ____________________________________________  RADIOLOGY  I reviewed any imaging ordered by me or triage that were performed during my shift and, if possible, patient and/or family made aware of any abnormal findings. ____________________________________________   PROCEDURES  Procedure(s) performed: None  Critical Care performed: None  ____________________________________________   INITIAL IMPRESSION / ASSESSMENT AND PLAN / ED COURSE  Pertinent labs & imaging results that were available during my care of the patient were reviewed by me and considered in my medical decision making (see chart for details).  Patient with significant hyponatremia unclear the etiology. She is a smoker, we'll obtain a chest x-ray to make sure there is no occult lung pathology possibly responsible. She will require gentle hydration which we have initiated, patient will require admission to the hospital which we'll arrange. ____________________________________________   FINAL CLINICAL IMPRESSION(S) / ED DIAGNOSES  Final  diagnoses:  None      This chart was dictated using voice recognition software.  Despite best efforts to proofread,  errors can occur which can change meaning.     Schuyler Amor, MD 09/25/15 [phone]

## 2015-09-25 NOTE — ED Notes (Signed)
Pt unable to urinate at this moment, given specimen cup for when is able to void.

## 2015-09-25 NOTE — H&P (Signed)
[place] [facility] [place] NAME: Mckenzie Lawrence    MR#:  [id number]  DATE OF BIRTH:  June 08, 1946  DATE OF ADMISSION:  09/25/2015  PRIMARY CARE PHYSICIAN: [place] Pink, MD   REQUESTING/REFERRING PHYSICIAN: Dr. Charlotte Crumb  CHIEF COMPLAINT:   Chief Complaint  Patient presents with  . Weakness    HISTORY OF PRESENT ILLNESS:  Mckenzie Lawrence  is a 70 y.o. female with a known history of Diabetes type 2 insulin-dependent, hyperlipidemia, hypertension, COPD with ongoing tobacco abuse, history of previous CVA, history of polycystic ovarian syndrome who presents to the hospital due to dizziness, weakness. Patient says that she has had a poor by mouth intake and has been feeling weak over the past week. She went to see her cardiologist today and told her about her symptoms and referred her to the ER for further evaluation. Patient in the emergency room was noted to have a sodium of 119 and hospitalist services were contacted further treatment and evaluation. Patient denies any diarrhea, abdominal pain, fever, chills, chest pain, shortness of breath. She does admit to some nausea and had some vomiting earlier today. Hospitalist services were contacted further treatment and evaluation  PAST MEDICAL HISTORY:   Past Medical History  Diagnosis Date  . PCOS (polycystic ovarian syndrome)   . Diabetes mellitus     insulin dependent  . Osteopenia 08/2014    T score -2.1 stable from prior Dexa's  . HLD (hyperlipidemia)   . Cataracts, bilateral   . Hypertension   . Broken foot   . Hx of cardiovascular stress test     a. ETT (6/13):  Ex 5:13; no ischemic changes  . Personal history of tobacco use, presenting hazards to health 01/09/2015  . Lacunar stroke of left subthalamic region [facility]) 02/2015    PAST SURGICAL HISTORY:   Past Surgical History  Procedure Laterality Date  . Vaginal hysterectomy  1979  . Knee surgery    . Hand surgery    . Labial  abscess    . Oophorectomy      BSO  . Pubo vag sling    . Groin abscess    . Foot surgery    . Shoulder surgery    . Cataract extraction      SOCIAL HISTORY:   Social History  Substance Use Topics  . Smoking status: Current Every Day Smoker -- 1.50 packs/day for 50 years    Types: Cigarettes  . Smokeless tobacco: Never Used     Comment: 3ppd x 10 year, then cut back to 1.5pdd since 02/2015  . Alcohol Use: 0.0 oz/week    0 Standard drinks or equivalent per week     Comment: Rare    FAMILY HISTORY:   Family History  Problem Relation Age of Onset  . Hypertension Mother   . Heart disease Mother 26    MI  . Diabetes Father   . Diabetes Sister   . Hypertension Sister   . Diabetes Brother   . Hypertension Brother   . Heart disease Brother 83    CAD  . Cancer Sister     Multiple myloma  . Diabetes Brother     DRUG ALLERGIES:   Allergies  Allergen Reactions  . Iodine Anaphylaxis and Other (See Comments)    Pt states that she is allergic to ingested iodine only.    . Shellfish Allergy Anaphylaxis  . Codeine Nausea And Vomiting  . Irbesartan Other (See Comments)  Reaction:  Unknown   . Morphine Sulfate Nausea And Vomiting  . Sulfa Antibiotics Other (See Comments)    Reaction:  Fever     REVIEW OF SYSTEMS:   Review of Systems  Constitutional: Negative for fever and weight loss.  HENT: Negative for congestion, nosebleeds and tinnitus.   Eyes: Negative for blurred vision, double vision and redness.  Respiratory: Negative for cough, hemoptysis and shortness of breath.   Cardiovascular: Negative for chest pain, orthopnea, leg swelling and PND.  Gastrointestinal: Positive for nausea and vomiting. Negative for abdominal pain, diarrhea and melena.  Genitourinary: Negative for dysuria, urgency and hematuria.  Musculoskeletal: Negative for joint pain and falls.  Neurological: Positive for dizziness and weakness. Negative for tingling, sensory change, focal weakness,  seizures and headaches.  Endo/Heme/Allergies: Negative for polydipsia. Does not bruise/bleed easily.  Psychiatric/Behavioral: Negative for depression and memory loss. The patient is not nervous/anxious.     MEDICATIONS AT HOME:   Prior to Admission medications   Medication Sig Start Date End Date Taking? Authorizing Provider  amLODipine (NORVASC) 5 MG tablet Take 5 mg by mouth at bedtime.    Yes Historical Provider, MD  ciprofloxacin (CIPRO) 500 MG tablet Take 500 mg by mouth 2 (two) times daily. 09/19/15 09/26/15 Yes Historical Provider, MD  clopidogrel (PLAVIX) 75 MG tablet Take 75 mg by mouth at bedtime.   Yes Historical Provider, MD  imipramine (TOFRANIL) 50 MG tablet Take 50 mg by mouth daily.   Yes Historical Provider, MD  insulin detemir (LEVEMIR) 100 UNIT/ML injection Inject 50-58 Units into the skin 2 (two) times daily. Pt uses 50 units in the morning and 58 units at night.   Yes Historical Provider, MD  losartan-hydrochlorothiazide (HYZAAR) 100-25 MG tablet Take 1 tablet by mouth daily.   Yes Historical Provider, MD  metFORMIN (GLUCOPHAGE) 1000 MG tablet Take 1,000 mg by mouth 2 (two) times daily with a meal.   Yes Historical Provider, MD  metoprolol succinate (TOPROL-XL) 50 MG 24 hr tablet Take 1 tablet (50 mg total) by mouth daily. 03/16/15  Yes Gladstone Lighter, MD  nicotine (NICOTROL) 10 MG inhaler Inhale 1 cartridge (1 continuous puffing total) into the lungs as needed for smoking cessation. 03/16/15  Yes Henreitta Leber, MD  oxybutynin (DITROPAN) 5 MG tablet Take 5 mg by mouth daily.   Yes Historical Provider, MD  pravastatin (PRAVACHOL) 40 MG tablet Take 40 mg by mouth at bedtime.    Yes Historical Provider, MD  vitamin B-12 (CYANOCOBALAMIN) 1000 MCG tablet Take 1,000 mcg by mouth daily.   Yes Historical Provider, MD  zolpidem (AMBIEN) 10 MG tablet Take 10 mg by mouth at bedtime as needed for sleep.   Yes Historical Provider, MD      VITAL SIGNS:  Blood pressure 103/65, pulse  92, temperature 97.6 F (36.4 C), resp. rate 18, height 5\' 8"  (1.727 m), weight 91.173 kg (201 lb), SpO2 98 %.  PHYSICAL EXAMINATION:  Physical Exam  GENERAL:  70 y.o.-year-old patient lying in the bed with no acute distress.  EYES: Pupils equal, round, reactive to light and accommodation. No scleral icterus. Extraocular muscles intact.  HEENT: Head atraumatic, normocephalic. Oropharynx and nasopharynx clear. No oropharyngeal erythema, moist oral mucosa  NECK:  Supple, no jugular venous distention. No thyroid enlargement, no tenderness.  LUNGS: Normal breath sounds bilaterally, no wheezing, rales, rhonchi. No use of accessory muscles of respiration.  CARDIOVASCULAR: S1, S2 RRR. No murmurs, rubs, gallops, clicks.  ABDOMEN: Soft, nontender, nondistended. Bowel sounds present.  No organomegaly or mass.  EXTREMITIES: No pedal edema, cyanosis, or clubbing. + 2 pedal & radial pulses b/l.   NEUROLOGIC: Cranial nerves II through XII are intact. No focal Motor or sensory deficits appreciated b/l PSYCHIATRIC: The patient is alert and oriented x 3. Good affect.  SKIN: No obvious rash, lesion, or ulcer.   LABORATORY PANEL:   CBC  Recent Labs Lab 09/25/15 1540  WBC 14.9*  HGB 13.4  HCT 38.4  PLT 354   ------------------------------------------------------------------------------------------------------------------  Chemistries   Recent Labs Lab 09/25/15 1540  NA 119*  K 4.4  CL 83*  CO2 24  GLUCOSE 156*  BUN 16  CREATININE 1.31*  CALCIUM 9.4   ------------------------------------------------------------------------------------------------------------------  Cardiac Enzymes No results for input(s): TROPONINI in the last 168 hours. ------------------------------------------------------------------------------------------------------------------  RADIOLOGY:  Dg Chest 2 View  09/25/2015  CLINICAL DATA:  70 year old female with increasing weakness for 1 week. Hyponatremia. Nausea  and vomiting. Initial encounter. EXAM: CHEST  2 VIEW COMPARISON:  Chest CT 01/09/2015.  Chest radiographs 06/16/2012 FINDINGS: Stable lung volumes. Mediastinal contours remain normal. Visualized tracheal air column is within normal limits. Chronic mild increased interstitial markings and attenuation of upper lobe bronchovascular markings is stable. No pneumothorax, pulmonary edema, pleural effusion or confluent pulmonary opacity. No acute osseous abnormality identified. Calcified aortic atherosclerosis. IMPRESSION: Stable compared to 2013.  No acute cardiopulmonary abnormality. Electronically Signed   By: Genevie Ann M.D.   On: 09/25/2015 16:59   Ct Head Wo Contrast  09/25/2015  CLINICAL DATA:  Increasing weakness over the past week. EXAM: CT HEAD WITHOUT CONTRAST TECHNIQUE: Contiguous axial images were obtained from the base of the skull through the vertex without intravenous contrast. COMPARISON:  Head CTA 03/15/2015 FINDINGS: The ventricles are normal in size and configuration. No extra-axial fluid collections are identified. The gray-white differentiation is normal. No CT findings for acute intracranial process such as hemorrhage or infarction. No mass lesions. The brainstem and cerebellum are grossly normal. The bony structures are intact. The paranasal sinuses and mastoid air cells are clear. The globes are intact. IMPRESSION: No acute intracranial findings or mass lesion. Electronically Signed   By: Marijo Sanes M.D.   On: 09/25/2015 15:47     IMPRESSION AND PLAN:   70 year old female with past medical history of COPD, history of previous CVA, ongoing tobacco abuse, hypertension, diabetes type 2 insulin-dependent who presents to the hospital due to dizziness and weakness and noted to be acutely hyponatremic.  1. Hyponatremia-likely hypotonic hypervolemic hyponatremia. -I will gently hydrate her with IV fluids and follow sodium. Hold her HCTZ. -Check urine, serum osmolality.  2.  Dizziness/weakness-due to the hyponatremia. -We'll correct sodium and follow clinically.  3. Essential hypertension-continue amlodipine, losartan, Toprol.  4. Diabetes type 2 without complication-continue metformin, Levemir , sliding scale insulin.  5. History of previous CVA-continue Plavix.  6. Hyperlipidemia-continue Pravachol.  7. Urinary incontinence-continue oxybutynin.  8. Tobacco dependence-continue Nicotrol inhaler.    All the records are reviewed and case discussed with ED provider. Management plans discussed with the patient, family and they are in agreement.  CODE STATUS: Full  TOTAL TIME TAKING CARE OF THIS PATIENT: 45 minutes.    Henreitta Leber M.D on 09/25/2015 at 6:05 PM  Between 7am to 6pm - Pager - [phone]  After 6pm go to [email or web address] - password [email or web address] [facility]  [place] [facility]  Office  [phone]  CC: Primary care physician; [place] Pink, MD

## 2015-09-26 ENCOUNTER — Other Ambulatory Visit: Payer: Self-pay | Admitting: *Deleted

## 2015-09-26 DIAGNOSIS — J449 Chronic obstructive pulmonary disease, unspecified: Secondary | ICD-10-CM | POA: Diagnosis not present

## 2015-09-26 DIAGNOSIS — E871 Hypo-osmolality and hyponatremia: Secondary | ICD-10-CM | POA: Diagnosis not present

## 2015-09-26 DIAGNOSIS — I10 Essential (primary) hypertension: Secondary | ICD-10-CM | POA: Diagnosis not present

## 2015-09-26 DIAGNOSIS — R42 Dizziness and giddiness: Secondary | ICD-10-CM | POA: Diagnosis not present

## 2015-09-26 LAB — BASIC METABOLIC PANEL
ANION GAP: 5 (ref 5–15)
Anion gap: 8 (ref 5–15)
BUN: 15 mg/dL (ref 6–20)
BUN: 13 mg/dL (ref 6–20)
CALCIUM: 8.5 mg/dL — AB (ref 8.9–10.3)
CO2: 25 mmol/L (ref 22–32)
CO2: 23 mmol/L (ref 22–32)
CREATININE: 1.18 mg/dL — AB (ref 0.44–1.00)
Calcium: 8.6 mg/dL — ABNORMAL LOW (ref 8.9–10.3)
Chloride: 94 mmol/L — ABNORMAL LOW (ref 101–111)
Chloride: 94 mmol/L — ABNORMAL LOW (ref 101–111)
Creatinine, Ser: 1.09 mg/dL — ABNORMAL HIGH (ref 0.44–1.00)
GFR calc non Af Amer: 46 mL/min — ABNORMAL LOW (ref >60)
GFR, EST AFRICAN AMERICAN: 59 mL/min — AB (ref >60)
GFR, EST AFRICAN AMERICAN: 54 mL/min — AB (ref >60)
GFR, EST NON AFRICAN AMERICAN: 51 mL/min — AB (ref >60)
GLUCOSE: 127 mg/dL — AB (ref 65–99)
Glucose, Bld: 136 mg/dL — ABNORMAL HIGH (ref 65–99)
POTASSIUM: 3.9 mmol/L (ref 3.5–5.1)
POTASSIUM: 3.8 mmol/L (ref 3.5–5.1)
SODIUM: 124 mmol/L — AB (ref 135–145)
SODIUM: 125 mmol/L — AB (ref 135–145)

## 2015-09-26 LAB — CBC
HEMATOCRIT: 32.2 % — AB (ref 35.0–47.0)
HEMOGLOBIN: 11.3 g/dL — AB (ref 12.0–16.0)
MCH: 30.8 pg (ref 26.0–34.0)
MCHC: 35 g/dL (ref 32.0–36.0)
MCV: 87.9 fL (ref 80.0–100.0)
Platelets: 315 10*3/uL (ref 150–440)
RBC: 3.66 MIL/uL — ABNORMAL LOW (ref 3.80–5.20)
RDW: 13.5 % (ref 11.5–14.5)
WBC: 10.6 10*3/uL (ref 3.6–11.0)

## 2015-09-26 LAB — GLUCOSE, CAPILLARY
GLUCOSE-CAPILLARY: 131 mg/dL — AB (ref 65–99)
GLUCOSE-CAPILLARY: 102 mg/dL — AB (ref 65–99)
GLUCOSE-CAPILLARY: 87 mg/dL (ref 65–99)
GLUCOSE-CAPILLARY: 153 mg/dL — AB (ref 65–99)

## 2015-09-26 MED ORDER — LOSARTAN POTASSIUM 100 MG PO TABS: 100.0000 mg | ORAL_TABLET | Freq: Every day | ORAL | Status: DC

## 2015-09-26 NOTE — Progress Notes (Signed)
[place] [facility] [place] NAME: Mckenzie Lawrence    MR#:  [id number]  DATE OF BIRTH:  September 17, 1945  SUBJECTIVE:  CHIEF COMPLAINT:   Chief Complaint  Patient presents with  . Weakness   -Admitted due to poor oral intake, sodium was 119. With IV hydration, sodium improved up to 124 today. -Feels much better today, very anxious to go home.  REVIEW OF SYSTEMS:  Review of Systems  Constitutional: Positive for malaise/fatigue. Negative for fever and chills.  HENT: Negative for ear discharge, ear pain, sore throat and tinnitus.   Eyes: Negative for blurred vision and double vision.  Respiratory: Negative for cough, shortness of breath and wheezing.   Cardiovascular: Negative for chest pain and palpitations.  Gastrointestinal: Negative for nausea, vomiting, abdominal pain, diarrhea and constipation.  Genitourinary: Negative for dysuria and urgency.  Musculoskeletal: Negative for myalgias.  Neurological: Positive for sensory change and focal weakness. Negative for dizziness, speech change, seizures and headaches.       Right-sided weakness and paresthesias of the face since her stroke.  Psychiatric/Behavioral: Negative for depression.    DRUG ALLERGIES:   Allergies  Allergen Reactions  . Iodine Anaphylaxis and Other (See Comments)    Pt states that she is allergic to ingested iodine only.    . Shellfish Allergy Anaphylaxis  . Codeine Nausea And Vomiting  . Irbesartan Other (See Comments)    Reaction:  Unknown   . Morphine Sulfate Nausea And Vomiting  . Sulfa Antibiotics Other (See Comments)    Reaction:  Fever     VITALS:  Blood pressure 131/60, pulse 81, temperature 98.2 F (36.8 C), temperature source Oral, resp. rate 18, height 5\' 8"  (1.727 m), weight 92.126 kg (203 lb 1.6 oz), SpO2 98 %.  PHYSICAL EXAMINATION:  Physical Exam  GENERAL:  70 y.o.-year-old patient lying in the bed with no acute distress.  EYES: Pupils equal, round,  reactive to light and accommodation. No scleral icterus. Extraocular muscles intact.  HEENT: Head atraumatic, normocephalic. Oropharynx and nasopharynx clear.  NECK:  Supple, no jugular venous distention. No thyroid enlargement, no tenderness.  LUNGS: Normal breath sounds bilaterally, no wheezing, rales,rhonchi or crepitation. No use of accessory muscles of respiration.  CARDIOVASCULAR: S1, S2 normal. No murmurs, rubs, or gallops.  ABDOMEN: Soft, nontender, nondistended. Bowel sounds present. No organomegaly or mass.  EXTREMITIES: No pedal edema, cyanosis, or clubbing.  NEUROLOGIC: Cranial nerves II through XII are intact. Muscle strength 5/5 left-sided extremities. 5/5 in right side with giveaway weakness. Sensation intact. Gait not checked.  PSYCHIATRIC: The patient is alert and oriented x 3.  SKIN: No obvious rash, lesion, or ulcer.    LABORATORY PANEL:   CBC  Recent Labs Lab 09/26/15 0534  WBC 10.6  HGB 11.3*  HCT 32.2*  PLT 315   ------------------------------------------------------------------------------------------------------------------  Chemistries   Recent Labs Lab 09/26/15 1256  NA 125*  K 3.8  CL 94*  CO2 23  GLUCOSE 136*  BUN 13  CREATININE 1.18*  CALCIUM 8.6*   ------------------------------------------------------------------------------------------------------------------  Cardiac Enzymes No results for input(s): TROPONINI in the last 168 hours. ------------------------------------------------------------------------------------------------------------------  RADIOLOGY:  Dg Chest 2 View  09/25/2015  CLINICAL DATA:  70 year old female with increasing weakness for 1 week. Hyponatremia. Nausea and vomiting. Initial encounter. EXAM: CHEST  2 VIEW COMPARISON:  Chest CT 01/09/2015.  Chest radiographs 06/16/2012 FINDINGS: Stable lung volumes. Mediastinal contours remain normal. Visualized tracheal air column is within normal limits. Chronic mild increased  interstitial markings  and attenuation of upper lobe bronchovascular markings is stable. No pneumothorax, pulmonary edema, pleural effusion or confluent pulmonary opacity. No acute osseous abnormality identified. Calcified aortic atherosclerosis. IMPRESSION: Stable compared to 2013.  No acute cardiopulmonary abnormality. Electronically Signed   By: Genevie Ann M.D.   On: 09/25/2015 16:59   Ct Head Wo Contrast  09/25/2015  CLINICAL DATA:  Increasing weakness over the past week. EXAM: CT HEAD WITHOUT CONTRAST TECHNIQUE: Contiguous axial images were obtained from the base of the skull through the vertex without intravenous contrast. COMPARISON:  Head CTA 03/15/2015 FINDINGS: The ventricles are normal in size and configuration. No extra-axial fluid collections are identified. The gray-white differentiation is normal. No CT findings for acute intracranial process such as hemorrhage or infarction. No mass lesions. The brainstem and cerebellum are grossly normal. The bony structures are intact. The paranasal sinuses and mastoid air cells are clear. The globes are intact. IMPRESSION: No acute intracranial findings or mass lesion. Electronically Signed   By: Marijo Sanes M.D.   On: 09/25/2015 15:47    EKG:   Orders placed or performed during the hospital encounter of 09/25/15  . ED EKG  . ED EKG  . EKG 12-Lead  . EKG 12-Lead    ASSESSMENT AND PLAN:   70 year old female with past medical history of COPD, history of previous CVA, ongoing tobacco abuse, hypertension, diabetes type 2 insulin-dependent who presents to the hospital due to dizziness and weakness and noted to be acutely hyponatremic.  1. Hyponatremia-hypovolemic hyponatremia. Poor solute intake -Continue IV fluids. Sodium improving slowly. Continue to hold hydrochlorothiazide. -Recheck tomorrow a.m.  2. Dizziness/weakness-due to the hyponatremia. Improved since the sodium is corrected.  3. Essential hypertension-continue amlodipine, losartan,  Toprol.  4. Diabetes type 2 without complication-continue metformin, Levemir , sliding scale insulin.  5. History of previous CVA-continue Plavix. Has residual minimal right-sided weakness  6. Hyperlipidemia-continue Pravachol.  7. Urinary incontinence-continue oxybutynin.  8. Tobacco dependence-continue nicotine gum.  9. DVT prophylaxis-on Lovenox     All the records are reviewed and case discussed with Care Management/Social Workerr. Management plans discussed with the patient, family and they are in agreement.  CODE STATUS: Full code  TOTAL TIME TAKING CARE OF THIS PATIENT: 37 minutes.   POSSIBLE D/C IN 1-2 DAYS, DEPENDING ON CLINICAL CONDITION.   Gladstone Lighter M.D on 09/26/2015 at 1:47 PM  Between 7am to 6pm - Pager - [phone]  After 6pm go to [email or web address] - password [email or web address] [facility]  [place] [facility]  Office  [phone]  CC: Primary care physician; [place] Pink, MD

## 2015-09-26 NOTE — Progress Notes (Addendum)
Initial Nutrition Assessment      INTERVENTION:  -Monitor intake and cater to pt preferences -If unable to meet nutritional needs will add supplement.  NUTRITION DIAGNOSIS:   Inadequate oral intake related to acute illness as evidenced by per patient/family report.    GOAL:   Patient will meet greater than or equal to 90% of their needs    MONITOR:   PO intake  REASON FOR ASSESSMENT:   Malnutrition Screening Tool    ASSESSMENT:   70 y/o female admitted with weakness, dizziness, hyponatremia.  Past Medical History  Diagnosis Date  . PCOS (polycystic ovarian syndrome)   . Diabetes mellitus     insulin dependent  . Osteopenia 08/2014    T score -2.1 stable from prior Dexa's  . HLD (hyperlipidemia)   . Cataracts, bilateral   . Hypertension   . Broken foot   . Hx of cardiovascular stress test     a. ETT (6/13):  Ex 5:13; no ischemic changes  . Personal history of tobacco use, presenting hazards to health 01/09/2015  . Lacunar stroke of left subthalamic region [facility]) 02/2015    Pt reports eating fruit and 1/2 of bagel this am for breakfast. Reports intake prior to admission poor, bites for the past week and half secondary to dizziness and not feeling well   Medications reviewed: aspart, levemir, metformin, vit b12, NS at 168ml/hr Labs reviewed Na 124,, BUN WDL, creatine 1.09, glucose 127  Nutrition-Focused physical exam completed. Findings are no fat depletion, no muscle depletion, and no edema. 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    Diet Order:  Diet heart healthy/carb modified Room service appropriate?: Yes; Fluid consistency:: Thin  Skin:  Reviewed, no issues  Last BM:  09/23/2015  Height:   Ht Readings from Last 1 Encounters:  09/25/15 5\' 8"  (1.727 m)    Weight: Pt reports 6 pound wt loss but noted per wt encounters wt gain since 02/2015  Wt Readings from Last 1 Encounters:  09/25/15 203 lb 1.6 oz (92.126 kg)    Ideal Body Weight:      BMI:  Body mass index is 30.89 kg/(m^2).  Estimated Nutritional Needs:   Kcal:  [id number] kcals/d.  Protein:  92-110 g/d  Fluid:  2 L/d  EDUCATION NEEDS:   No education needs identified at this time  Geran Haithcock B. Zenia Resides, [place], [place] (pager) Weekend/On-Call pager [phone])

## 2015-09-26 NOTE — Consult Note (Signed)
   [facility] Inpatient Consult   09/26/2015  Mckenzie Lawrence 19-Mar-1946 [id number]  Patient eligible for [place] with a diagnosis of Diabetes, HTN and COPD  for post hospital discharge follow up. Patient was evaluated for community based chronic disease management services with [facility] care Management Program as a benefit of patient's [employer] Medicare. Met with the patient at the bedside to explain [place] Management services. Patient endorses her primary care provider to be Dr. Jarome Lamas. Patient states her home phone number or her cell [phone] are her best contact numbers and gave written permission to speak with her husband [name] or daughter [name]. Consent form signed. Patient will receive post hospital discharge calls and be evaluated for monthly home visits. [facility] Care Management services does not interfere with or replace any services arranged by the inpatient care management team. RNCM left contact information and [facility] literature at the bedside. Made inpatient RNCM aware that [facility] will be following for care management. For additional questions please contact:   Valentino Saavedra RN, [facility] Liaison  [phone]) Business Mobile [phone]) Toll free office

## 2015-09-27 DIAGNOSIS — E871 Hypo-osmolality and hyponatremia: Secondary | ICD-10-CM | POA: Diagnosis not present

## 2015-09-27 DIAGNOSIS — R42 Dizziness and giddiness: Secondary | ICD-10-CM | POA: Diagnosis not present

## 2015-09-27 DIAGNOSIS — I10 Essential (primary) hypertension: Secondary | ICD-10-CM | POA: Diagnosis not present

## 2015-09-27 DIAGNOSIS — J449 Chronic obstructive pulmonary disease, unspecified: Secondary | ICD-10-CM | POA: Diagnosis not present

## 2015-09-27 LAB — BASIC METABOLIC PANEL
Anion gap: 6 (ref 5–15)
BUN: 12 mg/dL (ref 6–20)
CHLORIDE: 100 mmol/L — AB (ref 101–111)
CO2: 23 mmol/L (ref 22–32)
CREATININE: 1.05 mg/dL — AB (ref 0.44–1.00)
Calcium: 8.4 mg/dL — ABNORMAL LOW (ref 8.9–10.3)
GFR calc Af Amer: >60 mL/min (ref >60)
GFR calc non Af Amer: 53 mL/min — ABNORMAL LOW (ref >60)
GLUCOSE: 123 mg/dL — AB (ref 65–99)
Potassium: 4.1 mmol/L (ref 3.5–5.1)
SODIUM: 129 mmol/L — AB (ref 135–145)

## 2015-09-27 LAB — GLUCOSE, CAPILLARY
Glucose-Capillary: 122 mg/dL — ABNORMAL HIGH (ref 65–99)
Glucose-Capillary: 123 mg/dL — ABNORMAL HIGH (ref 65–99)

## 2015-09-27 LAB — PREALBUMIN: PREALBUMIN: 21.6 mg/dL (ref 18–38)

## 2015-09-27 NOTE — Progress Notes (Signed)
09/27/2015 1:14 PM  BP 152/64 mmHg  Pulse 82  Temp(Src) 98 F (36.7 C) (Oral)  Resp 20  Ht 5\' 8"  (1.727 m)  Wt 92.126 kg (203 lb 1.6 oz)  BMI 30.89 kg/m2  SpO2 98%. Patient discharged per MD orders. Discharge instructions reviewed with patient and patient verbalized understanding. IV removed per policy. Prescriptions discussedwith patient. Discharged ambulatory escorted by significant other.  Almedia Balls, RN

## 2015-09-27 NOTE — Care Management Important Message (Signed)
Important Message  Patient Details  Name: Mckenzie Lawrence MRN: [id number] Date of Birth: December 19, 1945   Medicare Important Message Given:  Yes    Beau Fanny, RN 09/27/2015, 8:55 AM

## 2015-09-27 NOTE — Progress Notes (Addendum)
Nutrition Follow-up     INTERVENTION:  -Monitor intake and cater to pt preferences -Answered questions regarding appropriate DM snacks and discussed healthy options.  Dicussed importance of adding lean, healthy protein at every meal and including fruits and veggies for a balanced diet.  Pt and husband present. All questions addressed.   -Do not recommend adding nutrition supplement at this time as appetite improved at this time   NUTRITION DIAGNOSIS:   Inadequate oral intake related to acute illness as evidenced by per patient/family report.    GOAL:   Patient will meet greater than or equal to 90% of their needs  Improving  MONITOR:   PO intake  REASON FOR ASSESSMENT:   Malnutrition Screening Tool    ASSESSMENT:   70 y/o female admitted with weakness, dizziness, hyponatremia.  Pt reports appetite is much better. Ate most of pot roast and vegetables last night for dinner. Had a bag of chips for snack last night. This am ate most of eggs, jello, home fries. Medications and labs reviewed   Diet Order:  Diet Carb Modified Diet regular Room service appropriate?: Yes; Fluid consistency:: Thin  Skin:  Reviewed, no issues  Last BM:  09/23/2015  Height:   Ht Readings from Last 1 Encounters:  09/25/15 5\' 8"  (1.727 m)    Weight:   Wt Readings from Last 1 Encounters:  09/25/15 203 lb 1.6 oz (92.126 kg)    Ideal Body Weight:     BMI:  Body mass index is 30.89 kg/(m^2).  Estimated Nutritional Needs:   Kcal:  [id number] kcals/d.  Protein:  92-110 g/d  Fluid:  2 L/d  EDUCATION NEEDS:   No education needs identified at this time  Murle Hellstrom B. Zenia Resides, [place], [place] (pager) Weekend/On-Call pager [phone])

## 2015-09-27 NOTE — Discharge Summary (Signed)
[place] [facility] [place] NAME: Deleon Sprenkle    MR#:  [id number]  DATE OF BIRTH:  09-17-1945  DATE OF ADMISSION:  09/25/2015 ADMITTING PHYSICIAN: Henreitta Leber, MD  DATE OF DISCHARGE: 09/27/15  PRIMARY CARE PHYSICIAN: [place] Pink, MD    ADMISSION DIAGNOSIS:  Hyponatremia [E87.1]  DISCHARGE DIAGNOSIS:  Active Problems:   Hyponatremia   SECONDARY DIAGNOSIS:   Past Medical History  Diagnosis Date  . PCOS (polycystic ovarian syndrome)   . Diabetes mellitus     insulin dependent  . Osteopenia 08/2014    T score -2.1 stable from prior Dexa's  . HLD (hyperlipidemia)   . Cataracts, bilateral   . Hypertension   . Broken foot   . Hx of cardiovascular stress test     a. ETT (6/13):  Ex 5:13; no ischemic changes  . Personal history of tobacco use, presenting hazards to health 01/09/2015  . Lacunar stroke of left subthalamic region [facility]) 02/2015    HOSPITAL COURSE:   71 year old female with past medical history of COPD, history of previous CVA, ongoing tobacco abuse, hypertension, diabetes type 2 insulin-dependent who presents to the hospital due to dizziness and weakness and noted to be acutely hyponatremic.  1. Hyponatremia-hypovolemic hyponatremia. Poor solute intake - low serum adn urine osm - dysguesia since her last CVA in Oct 2016- tiredness and not cooking and eating -received IV fluids. Sodium improving - 129 at discharge -Continue to hold hydrochlorothiazide. -dietician consult in hospital to discuss dietary restrictions - prealbumin done- but pending  2. Dizziness/weakness-due to the hyponatremia. Improved since the sodium is corrected.  3. Essential hypertension-continue amlodipine, losartan, Toprol.  4. Diabetes type 2 without complication-continue metformin, insuling  5. History of previous CVA-continue Plavix. Has residual minimal right-sided weakness  6. Hyperlipidemia-continue Pravachol.  7. Urinary  incontinence-continue oxybutynin.  8. Tobacco dependence-using nicotrol inhaler   Stable for discharge today Updated daughter prior to discharge  DISCHARGE CONDITIONS:   Stable  CONSULTS OBTAINED:   None  DRUG ALLERGIES:   Allergies  Allergen Reactions  . Iodine Anaphylaxis and Other (See Comments)    Pt states that she is allergic to ingested iodine only.    . Shellfish Allergy Anaphylaxis  . Codeine Nausea And Vomiting  . Irbesartan Other (See Comments)    Reaction:  Unknown   . Morphine Sulfate Nausea And Vomiting  . Sulfa Antibiotics Other (See Comments)    Reaction:  Fever     DISCHARGE MEDICATIONS:   Current Discharge Medication List    START taking these medications   Details  losartan (COZAAR) 100 MG tablet Take 1 tablet (100 mg total) by mouth daily. Qty: 30 tablet, Refills: 2      CONTINUE these medications which have NOT CHANGED   Details  amLODipine (NORVASC) 5 MG tablet Take 5 mg by mouth at bedtime.     clopidogrel (PLAVIX) 75 MG tablet Take 75 mg by mouth at bedtime.    imipramine (TOFRANIL) 50 MG tablet Take 50 mg by mouth daily.    insulin detemir (LEVEMIR) 100 UNIT/ML injection Inject 50-58 Units into the skin 2 (two) times daily. Pt uses 50 units in the morning and 58 units at night.    metFORMIN (GLUCOPHAGE) 1000 MG tablet Take 1,000 mg by mouth 2 (two) times daily with a meal.    metoprolol succinate (TOPROL-XL) 50 MG 24 hr tablet Take 1 tablet (50 mg total) by mouth daily. Qty: 30 tablet, Refills: 1  nicotine (NICOTROL) 10 MG inhaler Inhale 1 cartridge (1 continuous puffing total) into the lungs as needed for smoking cessation. Qty: 42 each, Refills: 0    oxybutynin (DITROPAN) 5 MG tablet Take 5 mg by mouth daily.    pravastatin (PRAVACHOL) 40 MG tablet Take 40 mg by mouth at bedtime.     vitamin B-12 (CYANOCOBALAMIN) 1000 MCG tablet Take 1,000 mcg by mouth daily.    zolpidem (AMBIEN) 10 MG tablet Take 10 mg by mouth at bedtime  as needed for sleep.      STOP taking these medications     ciprofloxacin (CIPRO) 500 MG tablet      losartan-hydrochlorothiazide (HYZAAR) 100-25 MG tablet          DISCHARGE INSTRUCTIONS:   1. PCP follow-up in 1 week 2. Follow BMP in 1 week  If you experience worsening of your admission symptoms, develop shortness of breath, life threatening emergency, suicidal or homicidal thoughts you must seek medical attention immediately by calling 911 or calling your MD immediately  if symptoms less severe.  You Must read complete instructions/literature along with all the possible adverse reactions/side effects for all the Medicines you take and that have been prescribed to you. Take any new Medicines after you have completely understood and accept all the possible adverse reactions/side effects.   Please note  You were cared for by a hospitalist during your hospital stay. If you have any questions about your discharge medications or the care you received while you were in the hospital after you are discharged, you can call the unit and asked to speak with the hospitalist on call if the hospitalist that took care of you is not available. Once you are discharged, your primary care physician will handle any further medical issues. Please note that NO REFILLS for any discharge medications will be authorized once you are discharged, as it is imperative that you return to your primary care physician (or establish a relationship with a primary care physician if you do not have one) for your aftercare needs so that they can reassess your need for medications and monitor your lab values.    Today   CHIEF COMPLAINT:   Chief Complaint  Patient presents with  . Weakness     VITAL SIGNS:  Blood pressure 152/64, pulse 82, temperature 98 F (36.7 C), temperature source Oral, resp. rate 20, height 5\' 8"  (1.727 m), weight 92.126 kg (203 lb 1.6 oz), SpO2 98 %.  I/O:   Intake/Output Summary (Last 24  hours) at 09/27/15 1427 Last data filed at 09/27/15 0900  Gross per 24 hour  Intake   2479 ml  Output   2550 ml  Net    -71 ml    PHYSICAL EXAMINATION:   Physical Exam  GENERAL: 70 y.o.-year-old patient lying in the bed with no acute distress.  EYES: Pupils equal, round, reactive to light and accommodation. No scleral icterus. Extraocular muscles intact.  HEENT: Head atraumatic, normocephalic. Oropharynx and nasopharynx clear.  NECK: Supple, no jugular venous distention. No thyroid enlargement, no tenderness.  LUNGS: Normal breath sounds bilaterally, no wheezing, rales,rhonchi or crepitation. No use of accessory muscles of respiration.  CARDIOVASCULAR: S1, S2 normal. No murmurs, rubs, or gallops.  ABDOMEN: Soft, nontender, nondistended. Bowel sounds present. No organomegaly or mass.  EXTREMITIES: No pedal edema, cyanosis, or clubbing.  NEUROLOGIC: Cranial nerves II through XII are intact. Muscle strength 5/5 left-sided extremities. 5/5 in right side with giveaway weakness. Sensation intact. Gait not checked.  PSYCHIATRIC: The patient is alert and oriented x 3.  SKIN: No obvious rash, lesion, or ulcer.   DATA REVIEW:   CBC  Recent Labs Lab 09/26/15 0534  WBC 10.6  HGB 11.3*  HCT 32.2*  PLT 315    Chemistries   Recent Labs Lab 09/27/15 0535  NA 129*  K 4.1  CL 100*  CO2 23  GLUCOSE 123*  BUN 12  CREATININE 1.05*  CALCIUM 8.4*    Cardiac Enzymes No results for input(s): TROPONINI in the last 168 hours.  Microbiology Results  No results found for this or any previous visit.  RADIOLOGY:  Dg Chest 2 View  09/25/2015  CLINICAL DATA:  70 year old female with increasing weakness for 1 week. Hyponatremia. Nausea and vomiting. Initial encounter. EXAM: CHEST  2 VIEW COMPARISON:  Chest CT 01/09/2015.  Chest radiographs 06/16/2012 FINDINGS: Stable lung volumes. Mediastinal contours remain normal. Visualized tracheal air column is within normal limits. Chronic  mild increased interstitial markings and attenuation of upper lobe bronchovascular markings is stable. No pneumothorax, pulmonary edema, pleural effusion or confluent pulmonary opacity. No acute osseous abnormality identified. Calcified aortic atherosclerosis. IMPRESSION: Stable compared to 2013.  No acute cardiopulmonary abnormality. Electronically Signed   By: Genevie Ann M.D.   On: 09/25/2015 16:59   Ct Head Wo Contrast  09/25/2015  CLINICAL DATA:  Increasing weakness over the past week. EXAM: CT HEAD WITHOUT CONTRAST TECHNIQUE: Contiguous axial images were obtained from the base of the skull through the vertex without intravenous contrast. COMPARISON:  Head CTA 03/15/2015 FINDINGS: The ventricles are normal in size and configuration. No extra-axial fluid collections are identified. The gray-white differentiation is normal. No CT findings for acute intracranial process such as hemorrhage or infarction. No mass lesions. The brainstem and cerebellum are grossly normal. The bony structures are intact. The paranasal sinuses and mastoid air cells are clear. The globes are intact. IMPRESSION: No acute intracranial findings or mass lesion. Electronically Signed   By: Marijo Sanes M.D.   On: 09/25/2015 15:47    EKG:   Orders placed or performed during the hospital encounter of 09/25/15  . ED EKG  . ED EKG  . EKG 12-Lead  . EKG 12-Lead      Management plans discussed with the patient, family and they are in agreement.  CODE STATUS:     Code Status Orders        Start     Ordered   09/25/15 2022  Full code   Continuous     09/25/15 2021    Code Status History    Date Active Date Inactive Code Status Order ID Comments User Context   03/16/2015  1:40 AM 03/16/2015  8:15 PM Full Code [id number]  Lance Coon, MD Inpatient    Advance Directive Documentation        Most Recent Value   Type of Advance Directive  Healthcare Power of [place], Living will   Pre-existing out of facility DNR order  (yellow form or pink MOST form)     "MOST" Form in Place?        TOTAL TIME TAKING CARE OF THIS PATIENT: 37  minutes.    Gladstone Lighter M.D on 09/27/2015 at 2:27 PM  Between 7am to 6pm - Pager - [phone]  After 6pm go to [email or web address] - password [email or web address] [facility]  [place] [facility]  Office  [phone]  CC: Primary care physician; [place] Pink, MD

## 2015-09-28 ENCOUNTER — Other Ambulatory Visit: Payer: Self-pay | Admitting: *Deleted

## 2015-09-28 NOTE — Patient Outreach (Signed)
First attempt made to contact pt for transition of care (week 1, discharged 4/12).  HIPPA compliant voice message left with contact number.   If no response, will try again.     Zara Chess.   [place] [facility]  [phone]

## 2015-10-02 ENCOUNTER — Other Ambulatory Visit: Payer: Self-pay | Admitting: *Deleted

## 2015-10-02 ENCOUNTER — Encounter: Payer: Self-pay | Admitting: *Deleted

## 2015-10-02 NOTE — Patient Outreach (Signed)
Second attempt made to contact for transition of care (discharged 4/12).  If no response, will try a third time.     Zara Chess.   [place] [facility]  [phone]

## 2015-10-02 NOTE — Patient Outreach (Signed)
Transition of care call- returned call to pt's voice message left earlier (pt returned RN CM's call).  Spoke with pt, HIPPA verified. Pt reports doing good since hospital discharge, just returned from visiting daughter who is a Marine scientist, lives in  [place].  Pt reports since stroke, no taste to food but makes herself eat.   Pt reports that was the problem prior to hospitalization, was not eating.   Pt reports sugars keeps her sugars in control, last reading 99.   RN CM discussed with pt [facility] transition of care program, f/u with weekly phone calls (31 days post discharge), home visit.    Plan to inform Dr. Kary Kos of [facility] involvement- fax barrier letter. As discussed with pt, plan to f/u again 4/26- home visit.  Zara Chess.   [place] [facility]  [phone]

## 2015-10-04 DIAGNOSIS — E871 Hypo-osmolality and hyponatremia: Secondary | ICD-10-CM | POA: Diagnosis not present

## 2015-10-04 DIAGNOSIS — E119 Type 2 diabetes mellitus without complications: Secondary | ICD-10-CM | POA: Diagnosis not present

## 2015-10-04 DIAGNOSIS — I10 Essential (primary) hypertension: Secondary | ICD-10-CM | POA: Diagnosis not present

## 2015-10-04 DIAGNOSIS — E538 Deficiency of other specified B group vitamins: Secondary | ICD-10-CM | POA: Diagnosis not present

## 2015-10-05 ENCOUNTER — Ambulatory Visit: Payer: Commercial Managed Care - HMO | Admitting: Neurology

## 2015-10-09 ENCOUNTER — Ambulatory Visit: Payer: Self-pay | Admitting: *Deleted

## 2015-10-09 DIAGNOSIS — G4733 Obstructive sleep apnea (adult) (pediatric): Secondary | ICD-10-CM | POA: Diagnosis not present

## 2015-10-10 ENCOUNTER — Other Ambulatory Visit: Payer: Self-pay | Admitting: *Deleted

## 2015-10-10 ENCOUNTER — Encounter: Payer: Self-pay | Admitting: *Deleted

## 2015-10-11 DIAGNOSIS — M25561 Pain in right knee: Secondary | ICD-10-CM | POA: Diagnosis not present

## 2015-10-11 DIAGNOSIS — M7582 Other shoulder lesions, left shoulder: Secondary | ICD-10-CM | POA: Diagnosis not present

## 2015-10-11 NOTE — Patient Outreach (Addendum)
[facility] Care Management   Home visit 10/10/15  Mckenzie Lawrence June 22, 1945 [id number]  Mckenzie Lawrence is an 70 y.o. female  Subjective:  Pt reports feeling better since discharge. Pt reports f/u with Dr. Kary Kos 4/21, lab work done, Na+ level still low (131), to f/u again 4/28 lab work.  Pt reports she was told to eat potato chips every day.  Pt reports does not have much of an appetite, no taste, from  CVA (had 3 in October 2016).   Pt reports continues to smoke 1.5 packs a day, aware needs to quit.   Objective:   Filed Vitals:   10/10/15 1600  BP: 142/64  Pulse: 92  Resp: 24    ROS  Physical Exam  Constitutional: She is oriented to person, place, and time. She appears well-developed and well-nourished.  Cardiovascular: Normal rate, regular rhythm and normal heart sounds.   Respiratory: Effort normal and breath sounds normal.  GI: Soft. Bowel sounds are normal.  Musculoskeletal: Normal range of motion.  Neurological: She is alert and oriented to person, place, and time.  Skin: Skin is warm and dry.  Psychiatric: She has a normal mood and affect. Her behavior is normal. Judgment and thought content normal.    Encounter Medications:  Reviewed with pt and spouse  Outpatient Encounter Prescriptions as of 10/10/2015  Medication Sig Note  . amLODipine (NORVASC) 5 MG tablet Take 5 mg by mouth at bedtime.    . Cholecalciferol (VITAMIN D3) 2000 units CHEW Chew by mouth. Daily   . clopidogrel (PLAVIX) 75 MG tablet Take 75 mg by mouth at bedtime.   Marland Kitchen imipramine (TOFRANIL) 50 MG tablet Take 50 mg by mouth daily.   . insulin detemir (LEVEMIR) 100 UNIT/ML injection Inject 50-58 Units into the skin 2 (two) times daily. Pt uses 50 units in the morning and 58 units at night. 10/10/2015: Pt taking 50-58 units in the morning   . losartan (COZAAR) 100 MG tablet Take 1 tablet (100 mg total) by mouth daily.   . metFORMIN (GLUCOPHAGE) 1000 MG tablet Take 1,000 mg by mouth 2 (two)  times daily with a meal.   . metoprolol succinate (TOPROL-XL) 50 MG 24 hr tablet Take 1 tablet (50 mg total) by mouth daily. 10/10/2015: Pt taking 25mg  daily   . nicotine (NICOTROL) 10 MG inhaler Inhale 1 cartridge (1 continuous puffing total) into the lungs as needed for smoking cessation.   Marland Kitchen oxybutynin (DITROPAN) 5 MG tablet Take 5 mg by mouth daily.   . pravastatin (PRAVACHOL) 40 MG tablet Take 40 mg by mouth at bedtime.    . vitamin B-12 (CYANOCOBALAMIN) 1000 MCG tablet Take 1,000 mcg by mouth daily.   Marland Kitchen zolpidem (AMBIEN) 10 MG tablet Take 10 mg by mouth at bedtime as needed for sleep. Reported on 10/10/2015 10/02/2015: Take as needed.    No facility-administered encounter medications on file as of 10/10/2015.    Functional Status:   In your present state of health, do you have any difficulty performing the following activities: 10/10/2015 09/25/2015  Hearing? N N  Vision? N N  Difficulty concentrating or making decisions? N N  Walking or climbing stairs? N N  Dressing or bathing? N N  Doing errands, shopping? Y N  Preparing Food and eating ? N -  Using the Toilet? N -  In the past six months, have you accidently leaked urine? N -  Do you have problems with loss of bowel control? N -  Managing your Medications? N -  Managing your Finances? N -  Housekeeping or managing your Housekeeping? N -    Fall/Depression Screening:    PHQ 2/9 Scores 10/10/2015  PHQ - 2 Score 0    Assessment:  Pleasant 70 year old woman, resides with spouse.  Lungs clear, no c/o pain.                           Hyponatremia- per pt, NA+ still low 131, pt to continue to eat salty foods as directed. BP 142/64.                         DM- per pt reports  Sugar this am 126, normally runs 80-90's.                                                    Plan:   Pt to f/u for lab work 4/28- sodium level low.             Pt to continue to eat even though no taste- effecting appetite.              Plan to continue to  follow pt for transition of care, f/u again telephonically 5/3.              Plan to fax  Dr. Kary Kos  4/25 home visit encounter.     [facility] CM Care Plan Problem One        Most Recent Value   Care Plan Problem One  Risk for readmission related to recent hospitalization- hyponatremia    Role Documenting the Problem One  Care Management [place] for Problem One  Active   [facility] Long Term Goal (31-90 days)  Pt would not readmit 31 days from day of disccharge    [facility] Long Term Goal Start Date  10/02/15   Interventions for Problem One Long Term Goal  Home visit done    [facility] CM Short Term Goal #1 (0-30 days)  [name] appetite would improve in the next 14 days    [facility] CM Short Term Goal #1 Start Date  10/02/15   Interventions for Short Term Goal #1  Reinforced with pt to continue to eat even though not hungry, importance of nutrition    [facility] CM Short Term Goal #2 (0-30 days)  [name] sodium level would be within normal limits in the next 10 days    [facility] CM Short Term Goal #2 Start Date  10/10/15   Interventions for Short Term Goal #2  Reinforced with pt to have extra sodium in diet as directed by MD      Mckenzie Lawrence.   [place] [facility]  [phone]

## 2015-10-13 DIAGNOSIS — E871 Hypo-osmolality and hyponatremia: Secondary | ICD-10-CM | POA: Diagnosis not present

## 2015-10-18 ENCOUNTER — Other Ambulatory Visit: Payer: Self-pay | Admitting: *Deleted

## 2015-10-18 NOTE — Patient Outreach (Signed)
Transition of care call (week 3, discharged 4/12).   Pt reports appetite doing better, has  more energy.  Pt reports blood work showed Na+ level 135, almost there as lab wants it to be 136.  Pt reports continues to have extra salt in her diet.  Pt reports made a decision not to use her CPAP last night, did not sleep.  Pt reports has difficulty adjusting to CPAP, MD aware, insurance says have to use 70 % of the time, doing her best.   RN CM discussed with pt plan to f/u again telephonically 5/11 (part of ongoing transition of care).     Mckenzie Lawrence.   [place] [facility]  [phone]

## 2015-10-26 ENCOUNTER — Ambulatory Visit: Payer: PPO | Admitting: *Deleted

## 2015-10-26 ENCOUNTER — Other Ambulatory Visit: Payer: Self-pay | Admitting: *Deleted

## 2015-10-26 NOTE — Patient Outreach (Signed)
Transition of care call successful (week 4, discharged 4/12).  Pt reports spouse got discharged from the hospital, had surgery.  Pt states daughter (nurse) is coming in, will be nursing both of [name] for the weekend.  Pt reports no recent MD visits, tired which is to be expected.   RN CM discussed f/u one more time telephonically 5/15.    Plan to f/u again with pt  telephonically 5/15.    Zara Chess.   [place] [facility]  [phone]

## 2015-10-30 ENCOUNTER — Other Ambulatory Visit: Payer: Self-pay | Admitting: *Deleted

## 2015-10-30 ENCOUNTER — Encounter: Payer: Self-pay | Admitting: *Deleted

## 2015-10-30 DIAGNOSIS — R399 Unspecified symptoms and signs involving the genitourinary system: Secondary | ICD-10-CM | POA: Diagnosis not present

## 2015-10-30 NOTE — Patient Outreach (Signed)
Final call (complete transition of care).   Spoke with pt, reports appetite still good, has not had repeat lab work done yet, to text MD and inquire when it will be done.  Last  Na+ level was 135, [facility] wants it to be 136.  Pt reports continues to include salt in her diet.    Pt reports she thinks she has a UTI, which is the third or fourth one in 2 months, dropped off a urinalysis to MD office today,have not heard back yet.   Pt reports she might f/u with a urologist.   RN CM discussed with pt today being final call, per pt no need for further follow up (community case management services).      Plan to send case closure letter to Dr.  Chrystine Oiler As discussed with pt, case closure letter to be sent, include [facility]'s contact number to call if needs arise in the future.  Plan to inform [facility] care management assistant to close case.   Zara Chess.   [place] [facility]  [phone]

## 2015-11-07 ENCOUNTER — Other Ambulatory Visit: Payer: Self-pay | Admitting: Gynecology

## 2015-11-07 DIAGNOSIS — N390 Urinary tract infection, site not specified: Secondary | ICD-10-CM | POA: Diagnosis not present

## 2015-11-07 DIAGNOSIS — Z0000 Encounter for general adult medical examination without abnormal findings: Secondary | ICD-10-CM | POA: Diagnosis not present

## 2015-11-08 DIAGNOSIS — G4733 Obstructive sleep apnea (adult) (pediatric): Secondary | ICD-10-CM | POA: Diagnosis not present

## 2015-11-10 ENCOUNTER — Ambulatory Visit: Payer: PPO | Admitting: Neurology

## 2015-11-14 DIAGNOSIS — M9903 Segmental and somatic dysfunction of lumbar region: Secondary | ICD-10-CM | POA: Diagnosis not present

## 2015-11-14 DIAGNOSIS — M4607 Spinal enthesopathy, lumbosacral region: Secondary | ICD-10-CM | POA: Diagnosis not present

## 2015-11-16 DIAGNOSIS — M4607 Spinal enthesopathy, lumbosacral region: Secondary | ICD-10-CM | POA: Diagnosis not present

## 2015-11-16 DIAGNOSIS — M9903 Segmental and somatic dysfunction of lumbar region: Secondary | ICD-10-CM | POA: Diagnosis not present

## 2015-12-09 DIAGNOSIS — G4733 Obstructive sleep apnea (adult) (pediatric): Secondary | ICD-10-CM | POA: Diagnosis not present

## 2015-12-11 DIAGNOSIS — Z79899 Other long term (current) drug therapy: Secondary | ICD-10-CM | POA: Diagnosis not present

## 2015-12-11 DIAGNOSIS — N3020 Other chronic cystitis without hematuria: Secondary | ICD-10-CM | POA: Diagnosis not present

## 2015-12-11 DIAGNOSIS — E119 Type 2 diabetes mellitus without complications: Secondary | ICD-10-CM | POA: Diagnosis not present

## 2015-12-11 DIAGNOSIS — E871 Hypo-osmolality and hyponatremia: Secondary | ICD-10-CM | POA: Diagnosis not present

## 2015-12-14 DIAGNOSIS — E7800 Pure hypercholesterolemia, unspecified: Secondary | ICD-10-CM | POA: Diagnosis not present

## 2015-12-14 DIAGNOSIS — I10 Essential (primary) hypertension: Secondary | ICD-10-CM | POA: Diagnosis not present

## 2015-12-14 DIAGNOSIS — J410 Simple chronic bronchitis: Secondary | ICD-10-CM | POA: Diagnosis not present

## 2015-12-26 ENCOUNTER — Other Ambulatory Visit: Payer: Self-pay | Admitting: Gynecology

## 2015-12-26 ENCOUNTER — Ambulatory Visit (INDEPENDENT_AMBULATORY_CARE_PROVIDER_SITE_OTHER): Payer: PPO | Admitting: Gynecology

## 2015-12-26 ENCOUNTER — Encounter: Payer: Self-pay | Admitting: Gynecology

## 2015-12-26 VITALS — BP 124/78 | Ht 67.0 in | Wt 206.0 lb

## 2015-12-26 DIAGNOSIS — Z01419 Encounter for gynecological examination (general) (routine) without abnormal findings: Secondary | ICD-10-CM | POA: Diagnosis not present

## 2015-12-26 DIAGNOSIS — N390 Urinary tract infection, site not specified: Secondary | ICD-10-CM | POA: Diagnosis not present

## 2015-12-26 DIAGNOSIS — N952 Postmenopausal atrophic vaginitis: Secondary | ICD-10-CM

## 2015-12-26 NOTE — Progress Notes (Signed)
    Mckenzie Lawrence May 25, 1946 [id number]        70 y.o.  G0P0  for breast and pelvic exam. Several issues noted below.  Past medical history,surgical history, problem list, medications, allergies, family history and social history were all reviewed and documented as reviewed in the EPIC chart.  ROS:  Performed with pertinent positives and negatives included in the history, assessment and plan.   Additional significant findings :  None   Exam: Caryn Bee assistant Filed Vitals:   12/26/15 1533  BP: 124/78  Height: 5\' 7"  (1.702 m)  Weight: 206 lb (93.441 kg)   General appearance:  Normal affect, orientation and appearance. Skin: Grossly normal HEENT: Without gross lesions.  No cervical or supraclavicular adenopathy. Thyroid normal.  Lungs:  Clear without wheezing, rales or rhonchi Cardiac: RR, without RMG Abdominal:  Soft, nontender, without masses, guarding, rebound, organomegaly or hernia Breasts:  Examined lying and sitting without masses, retractions, discharge or axillary adenopathy. Pelvic:  Ext/BUS/Vagina With atrophic changes  Adnexa without masses or tenderness    Anus and perineum normal   Rectovaginal normal sphincter tone without palpated masses or tenderness.    Assessment/Plan:  70 y.o. G0P0 female for breast and pelvic exam.   1. Postmenopausal/atrophic genital changes.  Status post TVH BSO pubovaginal sling. Had been on HRT in the past but discontinued previously. Doing well without significant hot flushes, night sweats, vaginal dryness. Continue to monitor report any issues. She did have reportedly 3 strokes of the past year. 2. Osteopenia. DEXA 08/2014 T score -2.1 stable from prior DEXA. Had been on Prolia for 4 years now on drug-free holiday. Repeat DEXA next year at 2 year interval. Increased calcium vitamin D reviewed. 3. Mammography due now. I gave her a written slip to schedule and she is going to call and schedule this in [place]. SBE monthly  reviewed. 4. Pap smear 2011. No Pap smear done today. No history of abnormal Pap smears previously. Based on current screening guidelines we both agree to stop screening by age and hysterectomy history. 5. Colonoscopy 6 years ago. Repeat at their recommended interval. 6. Cigarette smoking. Continues to smoke. Clearly understands the risks. 7. Health maintenance. No routine blood work done as this is done elsewhere. Follow up 1 year, sooner as needed.   Anastasio Auerbach MD, 4:17 PM 12/26/2015

## 2015-12-26 NOTE — Patient Instructions (Signed)
Schedule your mammogram;

## 2015-12-27 LAB — URINALYSIS W MICROSCOPIC + REFLEX CULTURE
Bilirubin Urine: NEGATIVE
CASTS: NONE SEEN [LPF]
Crystals: NONE SEEN [HPF]
GLUCOSE, UA: NEGATIVE
HGB URINE DIPSTICK: NEGATIVE
KETONES UR: NEGATIVE
NITRITE: NEGATIVE
PH: 7 (ref 5.0–8.0)
RBC / HPF: NONE SEEN RBC/HPF (ref <=2)
SQUAMOUS EPITHELIAL / LPF: NONE SEEN [HPF] (ref <=5)
Specific Gravity, Urine: 1.01 (ref 1.001–1.035)
WBC, UA: >60 WBC/HPF — AB (ref <=5)
Yeast: NONE SEEN [HPF]

## 2015-12-29 ENCOUNTER — Telehealth: Payer: Self-pay | Admitting: *Deleted

## 2015-12-29 DIAGNOSIS — N390 Urinary tract infection, site not specified: Secondary | ICD-10-CM

## 2015-12-29 LAB — URINE CULTURE: Colony Count: >=100000

## 2015-12-29 MED ORDER — CIPROFLOXACIN HCL 250 MG PO TABS: 250.0000 mg | ORAL_TABLET | Freq: Two times a day (BID) | ORAL | Status: DC

## 2015-12-29 NOTE — Telephone Encounter (Signed)
Pt aware, Rx sent, order placed.

## 2015-12-29 NOTE — Telephone Encounter (Signed)
The bacteria does show sensitivity to the antibiotics that should work. I would recommend though instead of the 3 day course go ahead with ciprofloxacin 250 mg twice a day 7 days. Follow up urine culture one month from now

## 2015-12-29 NOTE — Telephone Encounter (Signed)
Pt said she has been prescribed this several times and the medication doesn't work, even a 7 day dose doesn't help.  asked if other medications are option?

## 2015-12-29 NOTE — Telephone Encounter (Signed)
-----   Message from Anastasio Auerbach, MD sent at 12/29/2015  2:54 PM EDT ----- Tell patient her urine grew out bacteria. Recommend ciprofloxacin 250 mg twice a day 3 days.

## 2016-01-02 DIAGNOSIS — N3020 Other chronic cystitis without hematuria: Secondary | ICD-10-CM | POA: Diagnosis not present

## 2016-01-08 DIAGNOSIS — G4733 Obstructive sleep apnea (adult) (pediatric): Secondary | ICD-10-CM | POA: Diagnosis not present

## 2016-01-10 DIAGNOSIS — N183 Chronic kidney disease, stage 3 (moderate): Secondary | ICD-10-CM | POA: Diagnosis not present

## 2016-01-10 DIAGNOSIS — Z794 Long term (current) use of insulin: Secondary | ICD-10-CM | POA: Diagnosis not present

## 2016-01-10 DIAGNOSIS — E871 Hypo-osmolality and hyponatremia: Secondary | ICD-10-CM | POA: Diagnosis not present

## 2016-01-10 DIAGNOSIS — E1122 Type 2 diabetes mellitus with diabetic chronic kidney disease: Secondary | ICD-10-CM | POA: Diagnosis not present

## 2016-01-10 DIAGNOSIS — Z720 Tobacco use: Secondary | ICD-10-CM | POA: Diagnosis not present

## 2016-01-10 DIAGNOSIS — E1142 Type 2 diabetes mellitus with diabetic polyneuropathy: Secondary | ICD-10-CM | POA: Diagnosis not present

## 2016-01-24 DIAGNOSIS — Z794 Long term (current) use of insulin: Secondary | ICD-10-CM | POA: Diagnosis not present

## 2016-01-24 DIAGNOSIS — Z7984 Long term (current) use of oral hypoglycemic drugs: Secondary | ICD-10-CM | POA: Diagnosis not present

## 2016-01-24 DIAGNOSIS — E1122 Type 2 diabetes mellitus with diabetic chronic kidney disease: Secondary | ICD-10-CM | POA: Diagnosis not present

## 2016-01-31 ENCOUNTER — Telehealth: Payer: Self-pay | Admitting: *Deleted

## 2016-01-31 NOTE — Telephone Encounter (Signed)
Notified patient that annual lung cancer screening low dose CT scan is due. Confirmed that patient is within the age range of 55-77, and asymptomatic, (no signs or symptoms of lung cancer). Patient denies illness that would prevent curative treatment for lung cancer if found. The patient is a current smoker, with a 104 pack year history. The shared decision making visit was done 01/09/15 Patient is agreeable for CT scan being scheduled.

## 2016-02-07 DIAGNOSIS — J410 Simple chronic bronchitis: Secondary | ICD-10-CM | POA: Diagnosis not present

## 2016-02-08 DIAGNOSIS — G4733 Obstructive sleep apnea (adult) (pediatric): Secondary | ICD-10-CM | POA: Diagnosis not present

## 2016-02-08 DIAGNOSIS — N3020 Other chronic cystitis without hematuria: Secondary | ICD-10-CM | POA: Diagnosis not present

## 2016-02-08 DIAGNOSIS — I10 Essential (primary) hypertension: Secondary | ICD-10-CM | POA: Diagnosis not present

## 2016-02-12 DIAGNOSIS — R29898 Other symptoms and signs involving the musculoskeletal system: Secondary | ICD-10-CM | POA: Diagnosis not present

## 2016-02-12 DIAGNOSIS — Z720 Tobacco use: Secondary | ICD-10-CM | POA: Diagnosis not present

## 2016-02-12 DIAGNOSIS — E538 Deficiency of other specified B group vitamins: Secondary | ICD-10-CM | POA: Diagnosis not present

## 2016-02-12 DIAGNOSIS — Z794 Long term (current) use of insulin: Secondary | ICD-10-CM | POA: Diagnosis not present

## 2016-02-12 DIAGNOSIS — I10 Essential (primary) hypertension: Secondary | ICD-10-CM | POA: Diagnosis not present

## 2016-02-12 DIAGNOSIS — E118 Type 2 diabetes mellitus with unspecified complications: Secondary | ICD-10-CM | POA: Diagnosis not present

## 2016-02-15 ENCOUNTER — Other Ambulatory Visit: Payer: Self-pay | Admitting: *Deleted

## 2016-02-15 DIAGNOSIS — G4733 Obstructive sleep apnea (adult) (pediatric): Secondary | ICD-10-CM | POA: Insufficient documentation

## 2016-02-15 DIAGNOSIS — Z87891 Personal history of nicotine dependence: Secondary | ICD-10-CM

## 2016-02-22 ENCOUNTER — Ambulatory Visit (HOSPITAL_COMMUNITY)
Admission: RE | Admit: 2016-02-22 | Discharge: 2016-02-22 | Disposition: A | Discharge status: Home / Self Care | Payer: PPO | Source: Ambulatory Visit | Attending: Vascular Surgery | Admitting: Vascular Surgery

## 2016-02-22 ENCOUNTER — Other Ambulatory Visit (HOSPITAL_COMMUNITY): Payer: Self-pay | Admitting: Family Medicine

## 2016-02-22 DIAGNOSIS — N3020 Other chronic cystitis without hematuria: Secondary | ICD-10-CM | POA: Diagnosis not present

## 2016-02-22 DIAGNOSIS — I739 Peripheral vascular disease, unspecified: Secondary | ICD-10-CM | POA: Diagnosis not present

## 2016-02-22 DIAGNOSIS — E1151 Type 2 diabetes mellitus with diabetic peripheral angiopathy without gangrene: Secondary | ICD-10-CM | POA: Diagnosis not present

## 2016-02-22 DIAGNOSIS — E785 Hyperlipidemia, unspecified: Secondary | ICD-10-CM | POA: Insufficient documentation

## 2016-02-23 ENCOUNTER — Ambulatory Visit
Admission: RE | Admit: 2016-02-23 | Discharge: 2016-02-23 | Disposition: A | Discharge status: Home / Self Care | Payer: PPO | Source: Ambulatory Visit | Attending: Oncology | Admitting: Oncology

## 2016-02-23 DIAGNOSIS — R918 Other nonspecific abnormal finding of lung field: Secondary | ICD-10-CM | POA: Diagnosis not present

## 2016-02-23 DIAGNOSIS — F17210 Nicotine dependence, cigarettes, uncomplicated: Secondary | ICD-10-CM | POA: Diagnosis not present

## 2016-02-23 DIAGNOSIS — G4733 Obstructive sleep apnea (adult) (pediatric): Secondary | ICD-10-CM | POA: Diagnosis not present

## 2016-02-23 DIAGNOSIS — Z87891 Personal history of nicotine dependence: Secondary | ICD-10-CM | POA: Diagnosis not present

## 2016-03-04 ENCOUNTER — Encounter (HOSPITAL_COMMUNITY): Payer: PPO

## 2016-03-04 ENCOUNTER — Telehealth: Payer: Self-pay | Admitting: *Deleted

## 2016-03-04 DIAGNOSIS — N390 Urinary tract infection, site not specified: Secondary | ICD-10-CM | POA: Diagnosis not present

## 2016-03-04 NOTE — Telephone Encounter (Signed)
Notified patient of LDCT lung cancer screening results with recommendation for 12 month follow up imaging. Also notified of incidental finding noted below. Patient verbalizes understanding.   IMPRESSION: Stable bilateral pulmonary nodules including a 4.3 mm nodule in the posterior right upper lobe and an 8.0 mm ground-glass nodule in the left upper lobe. Lung-RADS Category 2, benign appearance or behavior. Continue annual screening with low-dose chest CT without contrast in 12 months.

## 2016-03-10 DIAGNOSIS — G4733 Obstructive sleep apnea (adult) (pediatric): Secondary | ICD-10-CM | POA: Diagnosis not present

## 2016-03-12 DIAGNOSIS — Z1231 Encounter for screening mammogram for malignant neoplasm of breast: Secondary | ICD-10-CM | POA: Diagnosis not present

## 2016-03-19 ENCOUNTER — Encounter: Payer: Self-pay | Admitting: Vascular Surgery

## 2016-03-19 DIAGNOSIS — N3020 Other chronic cystitis without hematuria: Secondary | ICD-10-CM | POA: Diagnosis not present

## 2016-03-25 ENCOUNTER — Ambulatory Visit (INDEPENDENT_AMBULATORY_CARE_PROVIDER_SITE_OTHER): Payer: PPO | Admitting: Vascular Surgery

## 2016-03-25 ENCOUNTER — Encounter: Payer: Self-pay | Admitting: Vascular Surgery

## 2016-03-25 DIAGNOSIS — I639 Cerebral infarction, unspecified: Secondary | ICD-10-CM | POA: Diagnosis not present

## 2016-03-25 NOTE — Addendum Note (Signed)
Addended by: Kaleen Mask on: 03/25/2016 04:53 PM   Modules accepted: Orders

## 2016-03-25 NOTE — Progress Notes (Signed)
New Carotid Patient  Referred by:  Maryland Pink, MD [address] [place], [place] [address]  Reason for referral: B leg weakness  History of Present Illness  Mckenzie Lawrence is a 70 y.o. (04-26-1946) female who h/o multiple CVA presents with chief complaint: bilateral leg weakness.  Pt notes at least 3 prior episodes of CVA.  The patient has never had amaurosis fugax or monocular blindness.  The patient has hemiplegia and facial asx.  The patient has never had receptive or expressive aphasia.   The patient notes she has had loss of sensation and taste of a portion of her tongue.  The patient denies any intermittent claudication or rest pain.  The patient's risks factors for carotid disease include: IDDM, HTN, and active smoking  Past Medical History:  Diagnosis Date  . Broken foot   . Cataracts, bilateral   . Diabetes mellitus    insulin dependent  . HLD (hyperlipidemia)   . Hx of cardiovascular stress test    a. ETT (6/13):  Ex 5:13; no ischemic changes  . Hypertension   . Lacunar stroke of left subthalamic region [facility]) 02/2015  . Osteopenia 08/2014   T score -2.1 stable from prior Dexa's  . PCOS (polycystic ovarian syndrome)   . Personal history of tobacco use, presenting hazards to health 01/09/2015    Past Surgical History:  Procedure Laterality Date  . CATARACT EXTRACTION    . FOOT SURGERY    . Groin Abscess    . HAND SURGERY    . KNEE SURGERY    . LABIAL ABSCESS    . OOPHORECTOMY     BSO  . PUBO VAG SLING    . SHOULDER SURGERY    . VAGINAL HYSTERECTOMY  1979    Social History   Social History  . Marital status: Married    Spouse name: N/A  . Number of children: 1  . Years of education: N/A   Occupational History  . Not on file.   Social History Main Topics  . Smoking status: Current Every Day Smoker    Packs/day: 1.50    Years: 50.00    Types: Cigarettes  . Smokeless tobacco: Never Used     Comment: 3ppd x 10 year, then cut  back to 1.5pdd since 02/2015  . Alcohol use 0.0 oz/week     Comment: Rare  . Drug use: No  . Sexual activity: Not Currently     Comment: 1st intercourse 70 yo-Fewer than 5 partners   Other Topics Concern  . Not on file   Social History Narrative   Lives at home with husband in a one story home.  Has 1 daughter.     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     Education: some college.    Family History  Problem Relation Age of Onset  . Hypertension Mother   . Heart disease Mother 52    MI  . Diabetes Father   . Diabetes Sister   . Hypertension Sister   . Diabetes Brother   . Hypertension Brother   . Heart disease Brother 63    CAD  . Cancer Sister     Multiple myloma  . Diabetes Brother     Current Outpatient Prescriptions  Medication Sig Dispense Refill  . amLODipine (NORVASC) 5 MG tablet Take 5 mg by mouth at bedtime.     . Cholecalciferol (VITAMIN D3) 2000 units CHEW Chew by mouth.  Daily    . ciprofloxacin (CIPRO) 250 MG tablet Take 1 tablet (250 mg total) by mouth 2 (two) times daily. 14 tablet 0  . clopidogrel (PLAVIX) 75 MG tablet Take 75 mg by mouth at bedtime.    Marland Kitchen imipramine (TOFRANIL) 50 MG tablet Take 50 mg by mouth daily.    . insulin detemir (LEVEMIR) 100 UNIT/ML injection Inject 50-58 Units into the skin 2 (two) times daily. Pt uses 50 units in the morning and 58 units at night.    . losartan (COZAAR) 100 MG tablet Take 1 tablet (100 mg total) by mouth daily. 30 tablet 2  . metFORMIN (GLUCOPHAGE) 1000 MG tablet Take 1,000 mg by mouth 2 (two) times daily with a meal.    . nitrofurantoin (MACRODANTIN) 100 MG capsule Take 100 mg by mouth 4 (four) times daily.    Marland Kitchen oxybutynin (DITROPAN) 5 MG tablet Take 5 mg by mouth daily.    . pravastatin (PRAVACHOL) 40 MG tablet Take 40 mg by mouth at bedtime.     . vitamin B-12 (CYANOCOBALAMIN) 1000 MCG tablet Take 1,000 mcg by mouth daily.     No current facility-administered medications for this visit.      Allergies  Allergen Reactions  . Iodine Anaphylaxis and Other (See Comments)    Pt states that she is allergic to ingested iodine only.    . Shellfish Allergy Anaphylaxis  . Codeine Nausea And Vomiting  . Irbesartan Other (See Comments)    Reaction:  Unknown  (Avapro)  . Morphine Sulfate Nausea And Vomiting  . Sulfa Antibiotics Other (See Comments)    Reaction:  Fever      REVIEW OF SYSTEMS:   Cardiac:  positive for: no symptoms, negative for: Chest pain or chest pressure, Shortness of breath upon exertion and Shortness of breath when lying flat,   Vascular:  positive for: no symptoms,  negative for: Pain in calf, thigh, or hip brought on by ambulation, Pain in feet at night that wakes you up from your sleep, Blood clot in your veins and Leg swelling  Pulmonary:  positive for: no symptoms,  negative for: Oxygen at home, Productive cough and Wheezing  Neurologic:  positive for: dizziness and weakness in legs, negative for: Sudden numbness in arms or legs, Sudden onset of difficulty speaking or slurred speech and Temporary loss of vision in one eye  Gastrointestinal:  positive for: no symptoms, negative for: Blood in stool and Vomited blood  Genitourinary:  positive for: no symptoms, negative for: Burning when urinating and Blood in urine  Psychiatric:  positive for: no symptoms,  negative for: Major depression  Hematologic:  positive for: no symptoms,  negative for: negative for: Bleeding problems and Problems with blood clotting too easily  Dermatologic:  positive for: no symptoms, negative for: Rashes or ulcers  Constitutional:  positive for: no symptoms, negative for: Fever or chills  Ear/Nose/Throat:  positive for: no symptoms, negative for: Change in hearing, Nose bleeds and Sore throat  Musculoskeletal:  positive for: no symptoms, negative for: Back pain, Joint pain and Muscle pain   For VQI Use Only  PRE-ADM LIVING: Home  AMB STATUS:  Ambulatory  CAD Sx: None  PRIOR CHF: None  STRESS TEST: No   Physical Examination  Vitals:   03/25/16 1505 03/25/16 1507  BP: (!) 150/83 (!) 146/75  Pulse: 93   Temp: 97.8 F (36.6 C)   SpO2: 99%     There is no height or weight on file to  calculate BMI.  General: Alert, O x 3, WD,NAD  Head: [place]/AT,   Ear/Nose/Throat: Hearing grossly intact, nares without erythema or drainage, oropharynx without Erythema or Exudate , Mallampati score: 3, Dentition intact  Eyes: PERRLA, EOMI,   Neck: Supple, mid-line trachea,    Pulmonary: Sym exp, good B air movt,CTA B  Cardiac: RRR, Nl S1, S2, no Murmurs, No rubs, No S3,S4  Vascular: Vessel Right Left  Radial Palpable Palpable  Brachial Palpable Palpable  Carotid Palpable, No Bruit Palpable, No Bruit  Aorta Not palpable N/A  Femoral Palpable Palpable  Popliteal Not palpable Not palpable  PT Palpable Palpable  DP Palpable Palpable   Gastrointestinal: soft, non-distended, non-tender to palpation, No guarding or rebound, no HSM, no masses, no CVAT B, No palpable prominent aortic pulse,    Musculoskeletal: M/S 5/5 throughout  , Extremities without ischemic changes  , No edema present,  , No LDS present  Neurologic: CN 2-12 intact , slight asx face, Pain and light touch intact in extremities , Motor exam as listed above  Psychiatric: Judgement intact, Mood & affect appropriate for pt's clinical situation  Dermatologic: See M/S exam for extremity exam, No rashes otherwise noted  Lymph : Palpable lymph nodes: None   Non-Invasive Vascular Imaging  CAROTID DUPLEX ordered  ABI (Date: 02/22/16)  R:   ABI: .93,   DP: tri  PT: tri  TBI:  0.77  L:   ABI: 0.96,   DP: tri  PT: tri  TBI: 1.16   Outside Studies/Documentation 5 pages of outside documents were reviewed including: outpatient PCP chart.   Medical Decision Making  Mckenzie Lawrence is a 70 y.o. female who presents with: prior CVA of unknown etiology,  no evidence of PAD   Given her prior CVA, I think it is reasonable to get a B carotid duplex, which I don't see any recent studies.  I discussed in depth with the patient the nature of atherosclerosis, and emphasized the importance of maximal medical management including strict control of blood pressure, blood glucose, and lipid levels, obtaining regular exercise, antiplatelet agents, and cessation of smoking.   The patient is currently on a statin:  Pravachol. The patient is currently not on an anti-platelet.  Patient will be started on ASA 81 mg PO daily  The patient is aware that without maximal medical management the underlying atherosclerotic disease process will progress, limiting the benefit of any interventions.  Thank you for allowing [name] to participate in this patient's care.   Adele Barthel, MD, FACS [facility] Office: [phone] Pager: [phone]  03/25/2016, 3:53 PM

## 2016-03-27 DIAGNOSIS — I10 Essential (primary) hypertension: Secondary | ICD-10-CM | POA: Diagnosis not present

## 2016-03-27 DIAGNOSIS — F5101 Primary insomnia: Secondary | ICD-10-CM | POA: Diagnosis not present

## 2016-04-05 ENCOUNTER — Encounter: Payer: PPO | Admitting: Vascular Surgery

## 2016-04-09 DIAGNOSIS — G4733 Obstructive sleep apnea (adult) (pediatric): Secondary | ICD-10-CM | POA: Diagnosis not present

## 2016-04-24 DIAGNOSIS — I10 Essential (primary) hypertension: Secondary | ICD-10-CM | POA: Diagnosis not present

## 2016-04-24 DIAGNOSIS — G4733 Obstructive sleep apnea (adult) (pediatric): Secondary | ICD-10-CM | POA: Diagnosis not present

## 2016-04-24 DIAGNOSIS — E7800 Pure hypercholesterolemia, unspecified: Secondary | ICD-10-CM | POA: Diagnosis not present

## 2016-04-24 DIAGNOSIS — I639 Cerebral infarction, unspecified: Secondary | ICD-10-CM | POA: Diagnosis not present

## 2016-04-24 DIAGNOSIS — J410 Simple chronic bronchitis: Secondary | ICD-10-CM | POA: Diagnosis not present

## 2016-04-26 ENCOUNTER — Ambulatory Visit: Payer: PPO | Admitting: Vascular Surgery

## 2016-04-26 ENCOUNTER — Ambulatory Visit (HOSPITAL_COMMUNITY)
Admission: RE | Admit: 2016-04-26 | Discharge: 2016-04-26 | Disposition: A | Discharge status: Home / Self Care | Payer: PPO | Source: Ambulatory Visit | Attending: Vascular Surgery | Admitting: Vascular Surgery

## 2016-04-26 DIAGNOSIS — I639 Cerebral infarction, unspecified: Secondary | ICD-10-CM | POA: Diagnosis not present

## 2016-04-26 LAB — VAS US CAROTID
LCCADDIAS: -11 cm/s
LCCADSYS: -47 cm/s
LCCAPDIAS: 14 cm/s
LEFT ECA DIAS: -17 cm/s
LICADDIAS: -29 cm/s
LICADSYS: -107 cm/s
LICAPDIAS: -25 cm/s
LICAPSYS: -96 cm/s
Left CCA prox sys: 98 cm/s
RCCAPSYS: 79 cm/s
RIGHT CCA MID DIAS: 10 cm/s
RIGHT ECA DIAS: -22 cm/s
Right CCA prox dias: 14 cm/s
Right cca dist sys: -81 cm/s

## 2016-05-08 DIAGNOSIS — E119 Type 2 diabetes mellitus without complications: Secondary | ICD-10-CM | POA: Diagnosis not present

## 2016-05-08 DIAGNOSIS — I10 Essential (primary) hypertension: Secondary | ICD-10-CM | POA: Diagnosis not present

## 2016-05-08 DIAGNOSIS — Z1159 Encounter for screening for other viral diseases: Secondary | ICD-10-CM | POA: Diagnosis not present

## 2016-05-08 DIAGNOSIS — Z23 Encounter for immunization: Secondary | ICD-10-CM | POA: Diagnosis not present

## 2016-05-08 DIAGNOSIS — G4733 Obstructive sleep apnea (adult) (pediatric): Secondary | ICD-10-CM | POA: Diagnosis not present

## 2016-05-08 DIAGNOSIS — Z9989 Dependence on other enabling machines and devices: Secondary | ICD-10-CM | POA: Diagnosis not present

## 2016-05-08 DIAGNOSIS — N390 Urinary tract infection, site not specified: Secondary | ICD-10-CM | POA: Diagnosis not present

## 2016-05-10 DIAGNOSIS — G4733 Obstructive sleep apnea (adult) (pediatric): Secondary | ICD-10-CM | POA: Diagnosis not present

## 2016-05-14 DIAGNOSIS — F5104 Psychophysiologic insomnia: Secondary | ICD-10-CM | POA: Diagnosis not present

## 2016-05-14 DIAGNOSIS — F5101 Primary insomnia: Secondary | ICD-10-CM | POA: Diagnosis not present

## 2016-05-14 DIAGNOSIS — I639 Cerebral infarction, unspecified: Secondary | ICD-10-CM | POA: Diagnosis not present

## 2016-05-14 DIAGNOSIS — R209 Unspecified disturbances of skin sensation: Secondary | ICD-10-CM | POA: Diagnosis not present

## 2016-05-14 DIAGNOSIS — G4733 Obstructive sleep apnea (adult) (pediatric): Secondary | ICD-10-CM | POA: Diagnosis not present

## 2016-05-15 ENCOUNTER — Encounter: Payer: Self-pay | Admitting: Vascular Surgery

## 2016-05-16 NOTE — Progress Notes (Signed)
Established Carotid Patient  History of Present Illness  Mckenzie Lawrence is a 70 y.o. ([birth year]) female who presents with chief complaint: prior strokes.  Pt returns today for B carotid duplex.   Patient has known history of TIA or stroke symptom.  The patient has never had amaurosis fugax or monocular blindness.  The patient has had facial asx or hemiplegia.  The patient has never had receptive or expressive aphasia.  The patient's previous neurologic deficits have resolved except for some altered R 2nd and 3rd finger altered sensation and change in taste  The patient's PMH, PSH, SH, and FamHx are unchanged from 03/25/16.  Current Outpatient Prescriptions  Medication Sig Dispense Refill  . amLODipine (NORVASC) 5 MG tablet Take 5 mg by mouth at bedtime.     . Cholecalciferol (VITAMIN D3) 2000 units CHEW Chew by mouth. Daily    . ciprofloxacin (CIPRO) 250 MG tablet Take 1 tablet (250 mg total) by mouth 2 (two) times daily. 14 tablet 0  . clopidogrel (PLAVIX) 75 MG tablet Take 75 mg by mouth at bedtime.    Marland Kitchen imipramine (TOFRANIL) 50 MG tablet Take 50 mg by mouth daily.    . insulin detemir (LEVEMIR) 100 UNIT/ML injection Inject 50-58 Units into the skin 2 (two) times daily. Pt uses 50 units in the morning and 58 units at night.    . losartan (COZAAR) 100 MG tablet Take 1 tablet (100 mg total) by mouth daily. 30 tablet 2  . metFORMIN (GLUCOPHAGE) 1000 MG tablet Take 1,000 mg by mouth 2 (two) times daily with a meal.    . nitrofurantoin (MACRODANTIN) 100 MG capsule Take 100 mg by mouth 4 (four) times daily.    Marland Kitchen oxybutynin (DITROPAN) 5 MG tablet Take 5 mg by mouth daily.    . pravastatin (PRAVACHOL) 40 MG tablet Take 40 mg by mouth at bedtime.     . vitamin B-12 (CYANOCOBALAMIN) 1000 MCG tablet Take 1,000 mcg by mouth daily.     No current facility-administered medications for this visit.     Allergies  Allergen Reactions  . Iodine Anaphylaxis and Other (See Comments)    Pt states  that she is allergic to ingested iodine only.    . Shellfish Allergy Anaphylaxis  . Codeine Nausea And Vomiting  . Irbesartan Other (See Comments)    Reaction:  Unknown  (Avapro)  . Morphine Sulfate Nausea And Vomiting  . Sulfa Antibiotics Other (See Comments)    Reaction:  Fever     On ROS today: no rest pain, no recent CVA or TIA   Physical Examination  Vitals:   05/17/16 1240 05/17/16 1243  BP: 140/77 (!) 142/75  Pulse: 88   Resp: 18   Temp: 98.1 F (36.7 C)   TempSrc: Oral   SpO2: 100%   Weight: 206 lb 9.6 oz (93.7 kg)   Height: 5\' 8"  (1.727 m)     Body mass index is 31.41 kg/m.  General: Alert, O x 3, WD,NAD  Eyes: PERRLA, EOMI,   Neck: Supple, mid-line trachea,    Pulmonary: Sym exp, good B air movt,  Cardiac: RR, tachycardiac, no Murmurs, No rubs, No S3,S4  Vascular: Vessel Right Left  Radial Palpable Palpable  Brachial Palpable Palpable  Carotid Palpable, No Bruit Palpable, No Bruit  Aorta Not palpable N/A  Femoral Palpable Palpable  Popliteal Not palpable Not palpable  PT Palpable Palpable  DP Palpable Palpable    Musculoskeletal: M/S 5/5 throughout grossly intact, no ischemic  appearing limbs  Neurologic: CN grossly intact, Pain and light touch intact in extremities, Motor exam as listed above   Non-Invasive Vascular Imaging  CAROTID DUPLEX (Date: 04/26/16):   R ICA stenosis: 1-39%  R VA: patent and antegrade  L ICA stenosis: 1-39%  L VA: patent and antegrade   Medical Decision Making  Mckenzie Lawrence is a 70 y.o. female who presents with: history of multiple prior CVA, asx B ICA stenosis 1-39%.   Based on the patient's vascular studies and examination, I have offered the patient: q2 year annual duplex.  If next study remains normal, can consider suspending.  I discussed in depth with the patient the nature of atherosclerosis, and emphasized the importance of maximal medical management including strict control of blood pressure,  blood glucose, and lipid levels, antiplatelet agents, obtaining regular exercise, and cessation of smoking.    The patient is aware that without maximal medical management the underlying atherosclerotic disease process will progress, limiting the benefit of any interventions. The patient is currently on a statin:  Pravachol. The patient is currently on an anti-platelet: ASA and Plavix.  Thank you for allowing [name] to participate in this patient's care.   Adele Barthel, MD, FACS [facility] Office: [phone] Pager: [phone]

## 2016-05-17 ENCOUNTER — Ambulatory Visit (INDEPENDENT_AMBULATORY_CARE_PROVIDER_SITE_OTHER): Payer: PPO | Admitting: Vascular Surgery

## 2016-05-17 ENCOUNTER — Encounter: Payer: Self-pay | Admitting: Vascular Surgery

## 2016-05-17 VITALS — BP 142/75 | HR 88 | Temp 98.1°F | Resp 18 | Ht 68.0 in | Wt 206.6 lb

## 2016-05-17 DIAGNOSIS — I639 Cerebral infarction, unspecified: Secondary | ICD-10-CM

## 2016-05-17 NOTE — Addendum Note (Signed)
Addended by: Lianne Cure A on: 05/17/2016 01:56 PM   Modules accepted: Orders

## 2016-05-27 ENCOUNTER — Ambulatory Visit (INDEPENDENT_AMBULATORY_CARE_PROVIDER_SITE_OTHER): Payer: PPO | Admitting: Podiatry

## 2016-05-27 DIAGNOSIS — Q828 Other specified congenital malformations of skin: Secondary | ICD-10-CM | POA: Diagnosis not present

## 2016-05-27 DIAGNOSIS — M2040 Other hammer toe(s) (acquired), unspecified foot: Secondary | ICD-10-CM

## 2016-05-27 NOTE — Progress Notes (Signed)
She presents today to complaint of painful elongated toenails and multiple porokeratotic lesions bilateral.  Objective: Pulses remain comparable no open lesions or wounds are noted. Multiple porokeratotic lesions toenails are thick yellow dystrophic and mycotic.  Painful  porokeratosis.  Plan: Debridement of reactive hyperkeratosis.

## 2016-06-03 DIAGNOSIS — G4733 Obstructive sleep apnea (adult) (pediatric): Secondary | ICD-10-CM | POA: Diagnosis not present

## 2016-06-09 DIAGNOSIS — G4733 Obstructive sleep apnea (adult) (pediatric): Secondary | ICD-10-CM | POA: Diagnosis not present

## 2016-06-18 DIAGNOSIS — R05 Cough: Secondary | ICD-10-CM | POA: Diagnosis not present

## 2016-06-18 DIAGNOSIS — J101 Influenza due to other identified influenza virus with other respiratory manifestations: Secondary | ICD-10-CM | POA: Diagnosis not present

## 2016-06-18 DIAGNOSIS — R509 Fever, unspecified: Secondary | ICD-10-CM | POA: Diagnosis not present

## 2016-06-18 DIAGNOSIS — E871 Hypo-osmolality and hyponatremia: Secondary | ICD-10-CM | POA: Diagnosis not present

## 2016-06-18 DIAGNOSIS — R0602 Shortness of breath: Secondary | ICD-10-CM | POA: Diagnosis not present

## 2016-06-21 DIAGNOSIS — R7989 Other specified abnormal findings of blood chemistry: Secondary | ICD-10-CM | POA: Diagnosis not present

## 2016-07-08 DIAGNOSIS — E119 Type 2 diabetes mellitus without complications: Secondary | ICD-10-CM | POA: Diagnosis not present

## 2016-07-08 DIAGNOSIS — M5432 Sciatica, left side: Secondary | ICD-10-CM | POA: Diagnosis not present

## 2016-07-10 DIAGNOSIS — G4733 Obstructive sleep apnea (adult) (pediatric): Secondary | ICD-10-CM | POA: Diagnosis not present

## 2016-07-29 DIAGNOSIS — M25562 Pain in left knee: Secondary | ICD-10-CM | POA: Diagnosis not present

## 2016-07-29 DIAGNOSIS — R0781 Pleurodynia: Secondary | ICD-10-CM | POA: Diagnosis not present

## 2016-07-29 DIAGNOSIS — Z9181 History of falling: Secondary | ICD-10-CM | POA: Diagnosis not present

## 2016-08-06 DIAGNOSIS — R0781 Pleurodynia: Secondary | ICD-10-CM | POA: Diagnosis not present

## 2016-08-06 DIAGNOSIS — S2231XA Fracture of one rib, right side, initial encounter for closed fracture: Secondary | ICD-10-CM | POA: Diagnosis not present

## 2016-08-08 DIAGNOSIS — G4733 Obstructive sleep apnea (adult) (pediatric): Secondary | ICD-10-CM | POA: Diagnosis not present

## 2016-08-08 DIAGNOSIS — J410 Simple chronic bronchitis: Secondary | ICD-10-CM | POA: Diagnosis not present

## 2016-08-08 DIAGNOSIS — Z8781 Personal history of (healed) traumatic fracture: Secondary | ICD-10-CM | POA: Diagnosis not present

## 2016-08-10 DIAGNOSIS — G4733 Obstructive sleep apnea (adult) (pediatric): Secondary | ICD-10-CM | POA: Diagnosis not present

## 2016-08-19 DIAGNOSIS — G4733 Obstructive sleep apnea (adult) (pediatric): Secondary | ICD-10-CM | POA: Diagnosis not present

## 2016-08-19 DIAGNOSIS — E7800 Pure hypercholesterolemia, unspecified: Secondary | ICD-10-CM | POA: Diagnosis not present

## 2016-08-19 DIAGNOSIS — J410 Simple chronic bronchitis: Secondary | ICD-10-CM | POA: Diagnosis not present

## 2016-08-19 DIAGNOSIS — I10 Essential (primary) hypertension: Secondary | ICD-10-CM | POA: Diagnosis not present

## 2016-09-05 DIAGNOSIS — G4733 Obstructive sleep apnea (adult) (pediatric): Secondary | ICD-10-CM | POA: Diagnosis not present

## 2016-10-30 DIAGNOSIS — Z8673 Personal history of transient ischemic attack (TIA), and cerebral infarction without residual deficits: Secondary | ICD-10-CM | POA: Diagnosis not present

## 2016-10-30 DIAGNOSIS — G4733 Obstructive sleep apnea (adult) (pediatric): Secondary | ICD-10-CM | POA: Diagnosis not present

## 2016-10-30 DIAGNOSIS — F5104 Psychophysiologic insomnia: Secondary | ICD-10-CM | POA: Diagnosis not present

## 2016-10-30 DIAGNOSIS — R2689 Other abnormalities of gait and mobility: Secondary | ICD-10-CM | POA: Diagnosis not present

## 2016-11-12 DIAGNOSIS — I10 Essential (primary) hypertension: Secondary | ICD-10-CM | POA: Diagnosis not present

## 2016-11-12 DIAGNOSIS — E871 Hypo-osmolality and hyponatremia: Secondary | ICD-10-CM | POA: Diagnosis not present

## 2016-11-12 DIAGNOSIS — N289 Disorder of kidney and ureter, unspecified: Secondary | ICD-10-CM | POA: Diagnosis not present

## 2016-11-14 DIAGNOSIS — N3020 Other chronic cystitis without hematuria: Secondary | ICD-10-CM | POA: Diagnosis not present

## 2017-01-01 ENCOUNTER — Encounter: Payer: Medicare Other | Admitting: Gynecology

## 2017-01-07 DIAGNOSIS — H353131 Nonexudative age-related macular degeneration, bilateral, early dry stage: Secondary | ICD-10-CM | POA: Diagnosis not present

## 2017-01-13 DIAGNOSIS — M779 Enthesopathy, unspecified: Secondary | ICD-10-CM | POA: Diagnosis not present

## 2017-01-20 ENCOUNTER — Ambulatory Visit (INDEPENDENT_AMBULATORY_CARE_PROVIDER_SITE_OTHER): Payer: PPO | Admitting: Gynecology

## 2017-01-20 ENCOUNTER — Encounter: Payer: Self-pay | Admitting: Gynecology

## 2017-01-20 VITALS — BP 130/74 | Ht 67.0 in | Wt 200.0 lb

## 2017-01-20 DIAGNOSIS — M8580 Other specified disorders of bone density and structure, unspecified site: Secondary | ICD-10-CM

## 2017-01-20 DIAGNOSIS — N952 Postmenopausal atrophic vaginitis: Secondary | ICD-10-CM

## 2017-01-20 DIAGNOSIS — Z01411 Encounter for gynecological examination (general) (routine) with abnormal findings: Secondary | ICD-10-CM | POA: Diagnosis not present

## 2017-01-20 NOTE — Patient Instructions (Signed)
Followup for bone density as scheduled. 

## 2017-01-20 NOTE — Progress Notes (Signed)
    Mckenzie Lawrence 04-27-1946 [id number]        71 y.o.  G0P0 for breast and pelvic exam.  Past medical history,surgical history, problem list, medications, allergies, family history and social history were all reviewed and documented as reviewed in the EPIC chart.  ROS:  Performed with pertinent positives and negatives included in the history, assessment and plan.   Additional significant findings :  None   Exam: Caryn Bee assistant Vitals:   01/20/17 1551  BP: 130/74  Weight: 200 lb (90.7 kg)  Height: 5\' 7"  (1.702 m)   Body mass index is 31.32 kg/m.  General appearance:  Normal affect, orientation and appearance. Skin: Grossly normal HEENT: Without gross lesions.  No cervical or supraclavicular adenopathy. Thyroid normal.  Lungs:  Clear without wheezing, rales or rhonchi Cardiac: RR, without RMG Abdominal:  Soft, nontender, without masses, guarding, rebound, organomegaly or hernia Breasts:  Examined lying and sitting without masses, retractions, discharge or axillary adenopathy. Pelvic:  Ext, BUS, Vagina: With atrophic changes  Adnexa: Without masses or tenderness    Anus and perineum: Normal   Rectovaginal: Normal sphincter tone without palpated masses or tenderness.    Assessment/Plan:  71 y.o. G0P0 female for breast and pelvic exam. Status post TVH, BSO with pubovaginal sling  1. Postmenopausal/atrophic genital changes.  Without significant hot flushes, night sweats or vaginal dryness. 2. Osteopenia. DEXA 2016 T score -2.1. Stable from prior DEXA. Had been on Prolia for 4 years with drug-free holiday. Repeat DEXA now a 2 year interval. Patient will schedule follow up for this. 3. Mammography due now I gave her written prescription to schedule in [place]. Breast exam normal today. SBE monthly reviewed. 4. Pap smear 2011. No Pap smear done today. No history of abnormal Pap smears. Per current screening guidelines based on age and hysterectomy history we both agree to  stop screening and she is comfortable with this. 5. Colonoscopy 7 years ago. Repeat at their recommended interval. 6. Health maintenance. No routine lab work done as this is done elsewhere. Follow up for bone density otherwise 1 year, sooner as needed.   Anastasio Auerbach MD, 4:23 PM 01/20/2017

## 2017-02-10 ENCOUNTER — Ambulatory Visit (INDEPENDENT_AMBULATORY_CARE_PROVIDER_SITE_OTHER): Payer: PPO

## 2017-02-10 ENCOUNTER — Other Ambulatory Visit: Payer: Self-pay | Admitting: Gynecology

## 2017-02-10 DIAGNOSIS — Z780 Asymptomatic menopausal state: Secondary | ICD-10-CM | POA: Diagnosis not present

## 2017-02-10 DIAGNOSIS — M8589 Other specified disorders of bone density and structure, multiple sites: Secondary | ICD-10-CM

## 2017-02-10 DIAGNOSIS — M8580 Other specified disorders of bone density and structure, unspecified site: Secondary | ICD-10-CM

## 2017-02-11 ENCOUNTER — Encounter: Payer: Self-pay | Admitting: Gynecology

## 2017-02-11 ENCOUNTER — Telehealth: Payer: Self-pay | Admitting: *Deleted

## 2017-02-11 DIAGNOSIS — Z122 Encounter for screening for malignant neoplasm of respiratory organs: Secondary | ICD-10-CM

## 2017-02-11 NOTE — Telephone Encounter (Signed)
Notified patient that annual lung cancer screening low dose CT scan is due currently or will be in near future. Confirmed that patient is within the age range of 55-77, and asymptomatic, (no signs or symptoms of lung cancer). Patient denies illness that would prevent curative treatment for lung cancer if found. Verified smoking history, (current, 106 pack year). The shared decision making visit was done 01/09/15. Patient is agreeable for CT scan being scheduled.

## 2017-02-24 DIAGNOSIS — I639 Cerebral infarction, unspecified: Secondary | ICD-10-CM | POA: Diagnosis not present

## 2017-02-24 DIAGNOSIS — G4733 Obstructive sleep apnea (adult) (pediatric): Secondary | ICD-10-CM | POA: Diagnosis not present

## 2017-02-24 DIAGNOSIS — Z794 Long term (current) use of insulin: Secondary | ICD-10-CM | POA: Diagnosis not present

## 2017-02-24 DIAGNOSIS — E785 Hyperlipidemia, unspecified: Secondary | ICD-10-CM | POA: Diagnosis not present

## 2017-02-24 DIAGNOSIS — E1122 Type 2 diabetes mellitus with diabetic chronic kidney disease: Secondary | ICD-10-CM | POA: Diagnosis not present

## 2017-02-24 DIAGNOSIS — N182 Chronic kidney disease, stage 2 (mild): Secondary | ICD-10-CM | POA: Diagnosis not present

## 2017-02-24 DIAGNOSIS — J449 Chronic obstructive pulmonary disease, unspecified: Secondary | ICD-10-CM | POA: Diagnosis not present

## 2017-02-24 DIAGNOSIS — Z720 Tobacco use: Secondary | ICD-10-CM | POA: Diagnosis not present

## 2017-02-24 DIAGNOSIS — E1165 Type 2 diabetes mellitus with hyperglycemia: Secondary | ICD-10-CM | POA: Diagnosis not present

## 2017-02-24 DIAGNOSIS — I10 Essential (primary) hypertension: Secondary | ICD-10-CM | POA: Diagnosis not present

## 2017-02-25 ENCOUNTER — Ambulatory Visit
Admission: RE | Admit: 2017-02-25 | Discharge: 2017-02-25 | Disposition: A | Discharge status: Home / Self Care | Payer: PPO | Source: Ambulatory Visit | Attending: Oncology | Admitting: Oncology

## 2017-02-25 DIAGNOSIS — Z87891 Personal history of nicotine dependence: Secondary | ICD-10-CM | POA: Diagnosis not present

## 2017-02-25 DIAGNOSIS — Z122 Encounter for screening for malignant neoplasm of respiratory organs: Secondary | ICD-10-CM | POA: Insufficient documentation

## 2017-03-03 ENCOUNTER — Telehealth: Payer: Self-pay | Admitting: *Deleted

## 2017-03-03 NOTE — Telephone Encounter (Signed)
Notified patient of LDCT lung cancer screening program results with recommendation for 12 month follow up imaging. Also notified of incidental findings noted below and is encouraged to discuss further with PCP who will receive a copy of this note and/or the CT report. Patient verbalizes understanding.  IMPRESSION: 1. Lung-RADS 2, benign appearance or behavior. Continue annual screening with low-dose chest CT without contrast in 12 months.

## 2017-03-17 DIAGNOSIS — H04201 Unspecified epiphora, right lacrimal gland: Secondary | ICD-10-CM | POA: Diagnosis not present

## 2017-03-18 DIAGNOSIS — Z9289 Personal history of other medical treatment: Secondary | ICD-10-CM | POA: Diagnosis not present

## 2017-03-18 DIAGNOSIS — Z1231 Encounter for screening mammogram for malignant neoplasm of breast: Secondary | ICD-10-CM | POA: Diagnosis not present

## 2017-04-24 DIAGNOSIS — H04201 Unspecified epiphora, right lacrimal gland: Secondary | ICD-10-CM | POA: Diagnosis not present

## 2017-05-02 DIAGNOSIS — M9903 Segmental and somatic dysfunction of lumbar region: Secondary | ICD-10-CM | POA: Diagnosis not present

## 2017-05-02 DIAGNOSIS — M9905 Segmental and somatic dysfunction of pelvic region: Secondary | ICD-10-CM | POA: Diagnosis not present

## 2017-05-02 DIAGNOSIS — M5417 Radiculopathy, lumbosacral region: Secondary | ICD-10-CM | POA: Diagnosis not present

## 2017-05-02 DIAGNOSIS — M5136 Other intervertebral disc degeneration, lumbar region: Secondary | ICD-10-CM | POA: Diagnosis not present

## 2017-05-05 DIAGNOSIS — M5417 Radiculopathy, lumbosacral region: Secondary | ICD-10-CM | POA: Diagnosis not present

## 2017-05-05 DIAGNOSIS — M9903 Segmental and somatic dysfunction of lumbar region: Secondary | ICD-10-CM | POA: Diagnosis not present

## 2017-05-05 DIAGNOSIS — M5136 Other intervertebral disc degeneration, lumbar region: Secondary | ICD-10-CM | POA: Diagnosis not present

## 2017-05-05 DIAGNOSIS — M9905 Segmental and somatic dysfunction of pelvic region: Secondary | ICD-10-CM | POA: Diagnosis not present

## 2017-05-12 DIAGNOSIS — M9905 Segmental and somatic dysfunction of pelvic region: Secondary | ICD-10-CM | POA: Diagnosis not present

## 2017-05-12 DIAGNOSIS — M5417 Radiculopathy, lumbosacral region: Secondary | ICD-10-CM | POA: Diagnosis not present

## 2017-05-12 DIAGNOSIS — M5136 Other intervertebral disc degeneration, lumbar region: Secondary | ICD-10-CM | POA: Diagnosis not present

## 2017-05-12 DIAGNOSIS — M9903 Segmental and somatic dysfunction of lumbar region: Secondary | ICD-10-CM | POA: Diagnosis not present

## 2017-05-14 DIAGNOSIS — E785 Hyperlipidemia, unspecified: Secondary | ICD-10-CM | POA: Diagnosis not present

## 2017-05-14 DIAGNOSIS — M9903 Segmental and somatic dysfunction of lumbar region: Secondary | ICD-10-CM | POA: Diagnosis not present

## 2017-05-14 DIAGNOSIS — E119 Type 2 diabetes mellitus without complications: Secondary | ICD-10-CM | POA: Diagnosis not present

## 2017-05-14 DIAGNOSIS — M5136 Other intervertebral disc degeneration, lumbar region: Secondary | ICD-10-CM | POA: Diagnosis not present

## 2017-05-14 DIAGNOSIS — M9905 Segmental and somatic dysfunction of pelvic region: Secondary | ICD-10-CM | POA: Diagnosis not present

## 2017-05-14 DIAGNOSIS — E871 Hypo-osmolality and hyponatremia: Secondary | ICD-10-CM | POA: Diagnosis not present

## 2017-05-14 DIAGNOSIS — I10 Essential (primary) hypertension: Secondary | ICD-10-CM | POA: Diagnosis not present

## 2017-05-14 DIAGNOSIS — M5417 Radiculopathy, lumbosacral region: Secondary | ICD-10-CM | POA: Diagnosis not present

## 2017-05-15 DIAGNOSIS — M9905 Segmental and somatic dysfunction of pelvic region: Secondary | ICD-10-CM | POA: Diagnosis not present

## 2017-05-15 DIAGNOSIS — M5136 Other intervertebral disc degeneration, lumbar region: Secondary | ICD-10-CM | POA: Diagnosis not present

## 2017-05-15 DIAGNOSIS — M9903 Segmental and somatic dysfunction of lumbar region: Secondary | ICD-10-CM | POA: Diagnosis not present

## 2017-05-15 DIAGNOSIS — M5417 Radiculopathy, lumbosacral region: Secondary | ICD-10-CM | POA: Diagnosis not present

## 2017-05-16 DIAGNOSIS — E785 Hyperlipidemia, unspecified: Secondary | ICD-10-CM | POA: Diagnosis not present

## 2017-05-16 DIAGNOSIS — E119 Type 2 diabetes mellitus without complications: Secondary | ICD-10-CM | POA: Diagnosis not present

## 2017-05-16 DIAGNOSIS — I10 Essential (primary) hypertension: Secondary | ICD-10-CM | POA: Diagnosis not present

## 2017-05-22 DIAGNOSIS — M5417 Radiculopathy, lumbosacral region: Secondary | ICD-10-CM | POA: Diagnosis not present

## 2017-05-22 DIAGNOSIS — R2689 Other abnormalities of gait and mobility: Secondary | ICD-10-CM | POA: Diagnosis not present

## 2017-05-22 DIAGNOSIS — M9905 Segmental and somatic dysfunction of pelvic region: Secondary | ICD-10-CM | POA: Diagnosis not present

## 2017-05-22 DIAGNOSIS — Z8673 Personal history of transient ischemic attack (TIA), and cerebral infarction without residual deficits: Secondary | ICD-10-CM | POA: Diagnosis not present

## 2017-05-22 DIAGNOSIS — G4733 Obstructive sleep apnea (adult) (pediatric): Secondary | ICD-10-CM | POA: Diagnosis not present

## 2017-05-22 DIAGNOSIS — F5104 Psychophysiologic insomnia: Secondary | ICD-10-CM | POA: Diagnosis not present

## 2017-05-22 DIAGNOSIS — M5136 Other intervertebral disc degeneration, lumbar region: Secondary | ICD-10-CM | POA: Diagnosis not present

## 2017-05-22 DIAGNOSIS — M9903 Segmental and somatic dysfunction of lumbar region: Secondary | ICD-10-CM | POA: Diagnosis not present

## 2017-05-23 DIAGNOSIS — M9903 Segmental and somatic dysfunction of lumbar region: Secondary | ICD-10-CM | POA: Diagnosis not present

## 2017-05-23 DIAGNOSIS — M5136 Other intervertebral disc degeneration, lumbar region: Secondary | ICD-10-CM | POA: Diagnosis not present

## 2017-05-23 DIAGNOSIS — M5417 Radiculopathy, lumbosacral region: Secondary | ICD-10-CM | POA: Diagnosis not present

## 2017-05-23 DIAGNOSIS — M9905 Segmental and somatic dysfunction of pelvic region: Secondary | ICD-10-CM | POA: Diagnosis not present

## 2017-05-28 DIAGNOSIS — M9903 Segmental and somatic dysfunction of lumbar region: Secondary | ICD-10-CM | POA: Diagnosis not present

## 2017-05-28 DIAGNOSIS — M5417 Radiculopathy, lumbosacral region: Secondary | ICD-10-CM | POA: Diagnosis not present

## 2017-05-28 DIAGNOSIS — M5136 Other intervertebral disc degeneration, lumbar region: Secondary | ICD-10-CM | POA: Diagnosis not present

## 2017-05-28 DIAGNOSIS — M9905 Segmental and somatic dysfunction of pelvic region: Secondary | ICD-10-CM | POA: Diagnosis not present

## 2017-05-30 DIAGNOSIS — M5136 Other intervertebral disc degeneration, lumbar region: Secondary | ICD-10-CM | POA: Diagnosis not present

## 2017-05-30 DIAGNOSIS — M9905 Segmental and somatic dysfunction of pelvic region: Secondary | ICD-10-CM | POA: Diagnosis not present

## 2017-05-30 DIAGNOSIS — M5417 Radiculopathy, lumbosacral region: Secondary | ICD-10-CM | POA: Diagnosis not present

## 2017-05-30 DIAGNOSIS — M9903 Segmental and somatic dysfunction of lumbar region: Secondary | ICD-10-CM | POA: Diagnosis not present

## 2017-06-02 DIAGNOSIS — M5417 Radiculopathy, lumbosacral region: Secondary | ICD-10-CM | POA: Diagnosis not present

## 2017-06-02 DIAGNOSIS — M9903 Segmental and somatic dysfunction of lumbar region: Secondary | ICD-10-CM | POA: Diagnosis not present

## 2017-06-02 DIAGNOSIS — M5136 Other intervertebral disc degeneration, lumbar region: Secondary | ICD-10-CM | POA: Diagnosis not present

## 2017-06-02 DIAGNOSIS — M9905 Segmental and somatic dysfunction of pelvic region: Secondary | ICD-10-CM | POA: Diagnosis not present

## 2017-06-04 DIAGNOSIS — M5136 Other intervertebral disc degeneration, lumbar region: Secondary | ICD-10-CM | POA: Diagnosis not present

## 2017-06-04 DIAGNOSIS — M9905 Segmental and somatic dysfunction of pelvic region: Secondary | ICD-10-CM | POA: Diagnosis not present

## 2017-06-04 DIAGNOSIS — M9903 Segmental and somatic dysfunction of lumbar region: Secondary | ICD-10-CM | POA: Diagnosis not present

## 2017-06-04 DIAGNOSIS — M5417 Radiculopathy, lumbosacral region: Secondary | ICD-10-CM | POA: Diagnosis not present

## 2017-06-06 DIAGNOSIS — M5136 Other intervertebral disc degeneration, lumbar region: Secondary | ICD-10-CM | POA: Diagnosis not present

## 2017-06-06 DIAGNOSIS — M9903 Segmental and somatic dysfunction of lumbar region: Secondary | ICD-10-CM | POA: Diagnosis not present

## 2017-06-06 DIAGNOSIS — M9905 Segmental and somatic dysfunction of pelvic region: Secondary | ICD-10-CM | POA: Diagnosis not present

## 2017-06-06 DIAGNOSIS — M5417 Radiculopathy, lumbosacral region: Secondary | ICD-10-CM | POA: Diagnosis not present

## 2017-06-16 DIAGNOSIS — M5136 Other intervertebral disc degeneration, lumbar region: Secondary | ICD-10-CM | POA: Diagnosis not present

## 2017-06-16 DIAGNOSIS — M9903 Segmental and somatic dysfunction of lumbar region: Secondary | ICD-10-CM | POA: Diagnosis not present

## 2017-06-16 DIAGNOSIS — M9905 Segmental and somatic dysfunction of pelvic region: Secondary | ICD-10-CM | POA: Diagnosis not present

## 2017-06-16 DIAGNOSIS — M5417 Radiculopathy, lumbosacral region: Secondary | ICD-10-CM | POA: Diagnosis not present

## 2017-06-20 DIAGNOSIS — M9903 Segmental and somatic dysfunction of lumbar region: Secondary | ICD-10-CM | POA: Diagnosis not present

## 2017-06-20 DIAGNOSIS — M9905 Segmental and somatic dysfunction of pelvic region: Secondary | ICD-10-CM | POA: Diagnosis not present

## 2017-06-20 DIAGNOSIS — M5136 Other intervertebral disc degeneration, lumbar region: Secondary | ICD-10-CM | POA: Diagnosis not present

## 2017-06-20 DIAGNOSIS — M5417 Radiculopathy, lumbosacral region: Secondary | ICD-10-CM | POA: Diagnosis not present

## 2017-06-23 DIAGNOSIS — M5417 Radiculopathy, lumbosacral region: Secondary | ICD-10-CM | POA: Diagnosis not present

## 2017-06-23 DIAGNOSIS — M5136 Other intervertebral disc degeneration, lumbar region: Secondary | ICD-10-CM | POA: Diagnosis not present

## 2017-06-23 DIAGNOSIS — M9903 Segmental and somatic dysfunction of lumbar region: Secondary | ICD-10-CM | POA: Diagnosis not present

## 2017-06-23 DIAGNOSIS — M9905 Segmental and somatic dysfunction of pelvic region: Secondary | ICD-10-CM | POA: Diagnosis not present

## 2017-06-26 DIAGNOSIS — H02831 Dermatochalasis of right upper eyelid: Secondary | ICD-10-CM | POA: Diagnosis not present

## 2017-06-26 DIAGNOSIS — M9905 Segmental and somatic dysfunction of pelvic region: Secondary | ICD-10-CM | POA: Diagnosis not present

## 2017-06-26 DIAGNOSIS — H02834 Dermatochalasis of left upper eyelid: Secondary | ICD-10-CM | POA: Diagnosis not present

## 2017-06-26 DIAGNOSIS — M5136 Other intervertebral disc degeneration, lumbar region: Secondary | ICD-10-CM | POA: Diagnosis not present

## 2017-06-26 DIAGNOSIS — M9903 Segmental and somatic dysfunction of lumbar region: Secondary | ICD-10-CM | POA: Diagnosis not present

## 2017-06-26 DIAGNOSIS — M5417 Radiculopathy, lumbosacral region: Secondary | ICD-10-CM | POA: Diagnosis not present

## 2017-07-01 DIAGNOSIS — M9903 Segmental and somatic dysfunction of lumbar region: Secondary | ICD-10-CM | POA: Diagnosis not present

## 2017-07-01 DIAGNOSIS — M5417 Radiculopathy, lumbosacral region: Secondary | ICD-10-CM | POA: Diagnosis not present

## 2017-07-01 DIAGNOSIS — M5136 Other intervertebral disc degeneration, lumbar region: Secondary | ICD-10-CM | POA: Diagnosis not present

## 2017-07-01 DIAGNOSIS — M9905 Segmental and somatic dysfunction of pelvic region: Secondary | ICD-10-CM | POA: Diagnosis not present

## 2017-07-08 DIAGNOSIS — M9905 Segmental and somatic dysfunction of pelvic region: Secondary | ICD-10-CM | POA: Diagnosis not present

## 2017-07-08 DIAGNOSIS — M5417 Radiculopathy, lumbosacral region: Secondary | ICD-10-CM | POA: Diagnosis not present

## 2017-07-08 DIAGNOSIS — M9903 Segmental and somatic dysfunction of lumbar region: Secondary | ICD-10-CM | POA: Diagnosis not present

## 2017-07-08 DIAGNOSIS — M5136 Other intervertebral disc degeneration, lumbar region: Secondary | ICD-10-CM | POA: Diagnosis not present

## 2017-07-08 DIAGNOSIS — H401131 Primary open-angle glaucoma, bilateral, mild stage: Secondary | ICD-10-CM | POA: Diagnosis not present

## 2017-07-14 DIAGNOSIS — G4733 Obstructive sleep apnea (adult) (pediatric): Secondary | ICD-10-CM | POA: Diagnosis not present

## 2017-07-15 DIAGNOSIS — H02834 Dermatochalasis of left upper eyelid: Secondary | ICD-10-CM | POA: Diagnosis not present

## 2017-07-15 DIAGNOSIS — H02831 Dermatochalasis of right upper eyelid: Secondary | ICD-10-CM | POA: Diagnosis not present

## 2017-07-15 DIAGNOSIS — E119 Type 2 diabetes mellitus without complications: Secondary | ICD-10-CM | POA: Diagnosis not present

## 2017-07-17 DIAGNOSIS — M9903 Segmental and somatic dysfunction of lumbar region: Secondary | ICD-10-CM | POA: Diagnosis not present

## 2017-07-17 DIAGNOSIS — M9905 Segmental and somatic dysfunction of pelvic region: Secondary | ICD-10-CM | POA: Diagnosis not present

## 2017-07-17 DIAGNOSIS — M5136 Other intervertebral disc degeneration, lumbar region: Secondary | ICD-10-CM | POA: Diagnosis not present

## 2017-07-17 DIAGNOSIS — M5417 Radiculopathy, lumbosacral region: Secondary | ICD-10-CM | POA: Diagnosis not present

## 2017-07-23 DIAGNOSIS — M9903 Segmental and somatic dysfunction of lumbar region: Secondary | ICD-10-CM | POA: Diagnosis not present

## 2017-07-23 DIAGNOSIS — M9905 Segmental and somatic dysfunction of pelvic region: Secondary | ICD-10-CM | POA: Diagnosis not present

## 2017-07-23 DIAGNOSIS — M5417 Radiculopathy, lumbosacral region: Secondary | ICD-10-CM | POA: Diagnosis not present

## 2017-07-23 DIAGNOSIS — M5136 Other intervertebral disc degeneration, lumbar region: Secondary | ICD-10-CM | POA: Diagnosis not present

## 2017-07-31 DIAGNOSIS — M9903 Segmental and somatic dysfunction of lumbar region: Secondary | ICD-10-CM | POA: Diagnosis not present

## 2017-07-31 DIAGNOSIS — M5417 Radiculopathy, lumbosacral region: Secondary | ICD-10-CM | POA: Diagnosis not present

## 2017-07-31 DIAGNOSIS — M9905 Segmental and somatic dysfunction of pelvic region: Secondary | ICD-10-CM | POA: Diagnosis not present

## 2017-07-31 DIAGNOSIS — M5136 Other intervertebral disc degeneration, lumbar region: Secondary | ICD-10-CM | POA: Diagnosis not present

## 2017-08-20 DIAGNOSIS — M5136 Other intervertebral disc degeneration, lumbar region: Secondary | ICD-10-CM | POA: Diagnosis not present

## 2017-08-20 DIAGNOSIS — M9903 Segmental and somatic dysfunction of lumbar region: Secondary | ICD-10-CM | POA: Diagnosis not present

## 2017-08-20 DIAGNOSIS — M9905 Segmental and somatic dysfunction of pelvic region: Secondary | ICD-10-CM | POA: Diagnosis not present

## 2017-08-20 DIAGNOSIS — M5417 Radiculopathy, lumbosacral region: Secondary | ICD-10-CM | POA: Diagnosis not present

## 2017-08-22 DIAGNOSIS — L239 Allergic contact dermatitis, unspecified cause: Secondary | ICD-10-CM | POA: Diagnosis not present

## 2017-08-22 DIAGNOSIS — L538 Other specified erythematous conditions: Secondary | ICD-10-CM | POA: Diagnosis not present

## 2017-08-22 DIAGNOSIS — R208 Other disturbances of skin sensation: Secondary | ICD-10-CM | POA: Diagnosis not present

## 2017-08-22 DIAGNOSIS — L820 Inflamed seborrheic keratosis: Secondary | ICD-10-CM | POA: Diagnosis not present

## 2017-08-22 DIAGNOSIS — L732 Hidradenitis suppurativa: Secondary | ICD-10-CM | POA: Diagnosis not present

## 2017-08-22 DIAGNOSIS — L700 Acne vulgaris: Secondary | ICD-10-CM | POA: Diagnosis not present

## 2017-08-25 DIAGNOSIS — Z8673 Personal history of transient ischemic attack (TIA), and cerebral infarction without residual deficits: Secondary | ICD-10-CM | POA: Diagnosis not present

## 2017-08-25 DIAGNOSIS — F5104 Psychophysiologic insomnia: Secondary | ICD-10-CM | POA: Diagnosis not present

## 2017-08-25 DIAGNOSIS — G4733 Obstructive sleep apnea (adult) (pediatric): Secondary | ICD-10-CM | POA: Diagnosis not present

## 2017-08-26 DIAGNOSIS — G4733 Obstructive sleep apnea (adult) (pediatric): Secondary | ICD-10-CM | POA: Diagnosis not present

## 2017-08-26 DIAGNOSIS — J449 Chronic obstructive pulmonary disease, unspecified: Secondary | ICD-10-CM | POA: Diagnosis not present

## 2017-08-26 DIAGNOSIS — I10 Essential (primary) hypertension: Secondary | ICD-10-CM | POA: Diagnosis not present

## 2017-08-26 DIAGNOSIS — I639 Cerebral infarction, unspecified: Secondary | ICD-10-CM | POA: Diagnosis not present

## 2017-08-26 DIAGNOSIS — E785 Hyperlipidemia, unspecified: Secondary | ICD-10-CM | POA: Diagnosis not present

## 2017-09-15 DIAGNOSIS — M5417 Radiculopathy, lumbosacral region: Secondary | ICD-10-CM | POA: Diagnosis not present

## 2017-09-15 DIAGNOSIS — M5136 Other intervertebral disc degeneration, lumbar region: Secondary | ICD-10-CM | POA: Diagnosis not present

## 2017-09-15 DIAGNOSIS — M9903 Segmental and somatic dysfunction of lumbar region: Secondary | ICD-10-CM | POA: Diagnosis not present

## 2017-09-15 DIAGNOSIS — M9905 Segmental and somatic dysfunction of pelvic region: Secondary | ICD-10-CM | POA: Diagnosis not present

## 2017-09-18 DIAGNOSIS — H02834 Dermatochalasis of left upper eyelid: Secondary | ICD-10-CM | POA: Diagnosis not present

## 2017-09-18 DIAGNOSIS — H02831 Dermatochalasis of right upper eyelid: Secondary | ICD-10-CM | POA: Diagnosis not present

## 2017-10-20 DIAGNOSIS — M9905 Segmental and somatic dysfunction of pelvic region: Secondary | ICD-10-CM | POA: Diagnosis not present

## 2017-10-20 DIAGNOSIS — M9903 Segmental and somatic dysfunction of lumbar region: Secondary | ICD-10-CM | POA: Diagnosis not present

## 2017-10-20 DIAGNOSIS — M5136 Other intervertebral disc degeneration, lumbar region: Secondary | ICD-10-CM | POA: Diagnosis not present

## 2017-10-20 DIAGNOSIS — M5417 Radiculopathy, lumbosacral region: Secondary | ICD-10-CM | POA: Diagnosis not present

## 2017-10-21 DIAGNOSIS — H02052 Trichiasis without entropian right lower eyelid: Secondary | ICD-10-CM | POA: Diagnosis not present

## 2017-10-29 ENCOUNTER — Other Ambulatory Visit: Payer: Self-pay

## 2017-10-30 NOTE — Discharge Instructions (Signed)
INSTRUCTIONS FOLLOWING OCULOPLASTIC SURGERY °AMY M. FOWLER, MD ° °AFTER YOUR EYE SURGERY, THER ARE MANY THINGS THWIHC YOU, THE PATIENT, CAN DO TO ASSURE THE BEST POSSIBLE RESULT FROM YOUR OPERATION.  THIS SHEET SHOULD BE REFERRED TO WHENEVER QUESTIONS ARISE.  IF THERE ARE ANY QUESTIONS NOT ANSWERED HERE, DO NOT HESITATE TO CALL OUR OFFICE AT [phone] OR [phone].  THERE IS ALWAYS [facility] AVAILABLE TO CALL IF QUESTIONS OR PROBLEMS ARISE. ° °VISION: Your vision may be blurred and out of focus after surgery until you are able to stop using your ointment, swelling resolves and your eye(s) heal. This may take 1 to 2 weeks at the least.  If your vision becomes gradually more dim or dark, this is not normal and you need to call our office immediately. ° °EYE CARE: For the first 48 hours after surgery, use ice packs frequently - “20 minutes on, 20 minutes off” - to help reduce swelling and bruising.  Small bags of frozen peas or corn make good ice packs along with cloths soaked in ice water.  If you are wearing a patch or other type of dressing following surgery, keep this on for the amount of time specified by your doctor.  For the first week following surgery, you will need to treat your stitches with great care.  If is OK to shower, but take care to not allow soapy water to run into your eye(s) to help reduce changes of infection.  You may gently clean the eyelashes and around the eye(s) with cotton balls and sterile water, BUT DO NOT RUB THE STITCHES VIGOROUSLY.  Keeping your stitches moist with ointment will help promote healing with minimal scar formation. ° °ACTIVITY: When you leave the surgery center, you should go home, rest and be inactive.  The eye(s) may feel scratchy and keeping the eyes closed will allow for faster healing.  The first week following surgery, avoid straining (anything making the face turn red) or lifting over 20 pounds.  Additionally, avoid bending which causes your head to go below  your waist.  Using your eyes will NOT harm them, so feel free to read, watch television, use the computer, etc as desired.  Driving depends on each individual, so check with your doctor if you have questions about driving. ° °MEDICATIONS:  You will be given a prescription for an ointment to use 4 times a day on your stitches.  You can use the ointment in your eyes if they feel scratchy or irritated.  If you eyelid(s) don’t close completely when you sleep, put some ointment in your eyes before bedtime. ° °EMERGENCY: If you experience SEVERE EYE PAIN OR HEADACHE UNRELIEVED BY TYLENOL OR PERCOCET, NAUSEA OR VOMITING, WORSENING REDNESS, OR WORSENING VISION (ESPECIALLY VISION THAT WA INITIALLY BETTER) CALL [phone] OR [phone] DURING BUSINESS HOURS OR AFTER HOURS. ° °General Anesthesia, Adult, Care After °These instructions provide you with information about caring for yourself after your procedure. Your health care provider may also give you more specific instructions. Your treatment has been planned according to current medical practices, but problems sometimes occur. Call your health care provider if you have any problems or questions after your procedure. °What can I expect after the procedure? °After the procedure, it is common to have: °· Vomiting. °· A sore throat. °· Mental slowness. ° °It is common to feel: °· Nauseous. °· Cold or shivery. °· Sleepy. °· Tired. °· Sore or achy, even in parts of your body where you did not have surgery. ° °  Follow these instructions at home: °For at least 24 hours after the procedure: °· Do not: °? Participate in activities where you could fall or become injured. °? Drive. °? Use heavy machinery. °? Drink alcohol. °? Take sleeping pills or medicines that cause drowsiness. °? Make important decisions or sign legal documents. °? Take care of children on your own. °· Rest. °Eating and drinking °· If you vomit, drink water, juice, or soup when you can drink without  vomiting. °· Drink enough fluid to keep your urine clear or pale yellow. °· Make sure you have little or no nausea before eating solid foods. °· Follow the diet recommended by your health care provider. °General instructions °· Have a responsible adult stay with you until you are awake and alert. °· Return to your normal activities as told by your health care provider. Ask your health care provider what activities are safe for you. °· Take over-the-counter and prescription medicines only as told by your health care provider. °· If you smoke, do not smoke without supervision. °· Keep all follow-up visits as told by your health care provider. This is important. °Contact a health care provider if: °· You continue to have nausea or vomiting at home, and medicines are not helpful. °· You cannot drink fluids or start eating again. °· You cannot urinate after 8-12 hours. °· You develop a skin rash. °· You have fever. °· You have increasing redness at the site of your procedure. °Get help right away if: °· You have difficulty breathing. °· You have chest pain. °· You have unexpected bleeding. °· You feel that you are having a life-threatening or urgent problem. °This information is not intended to replace advice given to you by your health care provider. Make sure you discuss any questions you have with your health care provider. °Document Released: 09/09/2000 Document Revised: 11/06/2015 Document Reviewed: 05/18/2015 °Elsevier Interactive Patient Education © 2018 [employer] ° °

## 2017-11-04 ENCOUNTER — Ambulatory Visit: Payer: PPO | Admitting: Anesthesiology

## 2017-11-04 ENCOUNTER — Ambulatory Visit
Admission: RE | Admit: 2017-11-04 | Discharge: 2017-11-04 | Disposition: A | Discharge status: Home / Self Care | Payer: PPO | Source: Ambulatory Visit | Attending: Ophthalmology | Admitting: Ophthalmology

## 2017-11-04 ENCOUNTER — Encounter
Admission: RE | Disposition: A | Discharge status: Home / Self Care | Payer: Self-pay | Source: Ambulatory Visit | Attending: Ophthalmology

## 2017-11-04 DIAGNOSIS — Z7982 Long term (current) use of aspirin: Secondary | ICD-10-CM | POA: Insufficient documentation

## 2017-11-04 DIAGNOSIS — F17200 Nicotine dependence, unspecified, uncomplicated: Secondary | ICD-10-CM | POA: Insufficient documentation

## 2017-11-04 DIAGNOSIS — Z79899 Other long term (current) drug therapy: Secondary | ICD-10-CM | POA: Insufficient documentation

## 2017-11-04 DIAGNOSIS — Z794 Long term (current) use of insulin: Secondary | ICD-10-CM | POA: Diagnosis not present

## 2017-11-04 DIAGNOSIS — K219 Gastro-esophageal reflux disease without esophagitis: Secondary | ICD-10-CM | POA: Insufficient documentation

## 2017-11-04 DIAGNOSIS — I10 Essential (primary) hypertension: Secondary | ICD-10-CM | POA: Diagnosis not present

## 2017-11-04 DIAGNOSIS — Z8673 Personal history of transient ischemic attack (TIA), and cerebral infarction without residual deficits: Secondary | ICD-10-CM | POA: Insufficient documentation

## 2017-11-04 DIAGNOSIS — E785 Hyperlipidemia, unspecified: Secondary | ICD-10-CM | POA: Insufficient documentation

## 2017-11-04 DIAGNOSIS — E119 Type 2 diabetes mellitus without complications: Secondary | ICD-10-CM | POA: Diagnosis not present

## 2017-11-04 DIAGNOSIS — H02834 Dermatochalasis of left upper eyelid: Secondary | ICD-10-CM | POA: Diagnosis not present

## 2017-11-04 DIAGNOSIS — G4730 Sleep apnea, unspecified: Secondary | ICD-10-CM | POA: Insufficient documentation

## 2017-11-04 DIAGNOSIS — H02831 Dermatochalasis of right upper eyelid: Secondary | ICD-10-CM | POA: Insufficient documentation

## 2017-11-04 HISTORY — DX: Chronic obstructive pulmonary disease, unspecified: J44.9

## 2017-11-04 HISTORY — PX: BROW LIFT: SHX178

## 2017-11-04 HISTORY — DX: Cerebral infarction, unspecified: I63.9

## 2017-11-04 HISTORY — DX: Orthostatic hypotension: I95.1

## 2017-11-04 HISTORY — DX: Sleep apnea, unspecified: G47.30

## 2017-11-04 HISTORY — DX: Myoneural disorder, unspecified: G70.9

## 2017-11-04 LAB — GLUCOSE, CAPILLARY
GLUCOSE-CAPILLARY: 137 mg/dL — AB (ref 65–99)
Glucose-Capillary: 149 mg/dL — ABNORMAL HIGH (ref 65–99)

## 2017-11-04 SURGERY — BLEPHAROPLASTY
Anesthesia: General | Site: Eye | Laterality: Bilateral | Wound class: Clean

## 2017-11-04 MED ORDER — LACTATED RINGERS IV SOLN
10.0000 mL/h | INTRAVENOUS | Status: DC
  Administered 2017-11-04: 09:00:00 via INTRAVENOUS

## 2017-11-04 MED ORDER — OXYCODONE-ACETAMINOPHEN 5-325 MG PO TABS: 1.0000 | ORAL_TABLET | ORAL | 0 refills | Status: DC | PRN

## 2017-11-04 MED ORDER — PROMETHAZINE HCL 50 MG PO TABS: 50.0000 mg | ORAL_TABLET | Freq: Four times a day (QID) | ORAL | 0 refills | Status: DC | PRN

## 2017-11-04 MED ORDER — ONDANSETRON HCL 4 MG/2ML IJ SOLN
INTRAMUSCULAR | Status: DC | PRN
  Administered 2017-11-04: 4 mg via INTRAVENOUS

## 2017-11-04 MED ORDER — PROPOFOL 10 MG/ML IV BOLUS
INTRAVENOUS | Status: DC | PRN
  Administered 2017-11-04: 200 mg via INTRAVENOUS

## 2017-11-04 MED ORDER — ACETAMINOPHEN 325 MG PO TABS: 325.0000 mg | ORAL_TABLET | ORAL | Status: DC | PRN

## 2017-11-04 MED ORDER — ACETAMINOPHEN 160 MG/5ML PO SOLN: 325.0000 mg | ORAL | Status: DC | PRN

## 2017-11-04 MED ORDER — TETRACAINE HCL 0.5 % OP SOLN
OPHTHALMIC | Status: DC | PRN
Start: 2017-11-04 — End: 2017-11-04
  Administered 2017-11-04: 2 [drp] via OPHTHALMIC

## 2017-11-04 MED ORDER — FENTANYL CITRATE (PF) 100 MCG/2ML IJ SOLN: 25.0000 ug | INTRAMUSCULAR | Status: DC | PRN

## 2017-11-04 MED ORDER — LIDOCAINE-EPINEPHRINE 2 %-1:100000 IJ SOLN
INTRAMUSCULAR | Status: DC | PRN
  Administered 2017-11-04: 3 mL via OPHTHALMIC

## 2017-11-04 MED ORDER — ACETAMINOPHEN 10 MG/ML IV SOLN
INTRAVENOUS | Status: DC | PRN
  Administered 2017-11-04: 1000 mg via INTRAVENOUS

## 2017-11-04 MED ORDER — GLYCOPYRROLATE 0.2 MG/ML IJ SOLN
INTRAMUSCULAR | Status: DC | PRN
  Administered 2017-11-04: 0.2 mg via INTRAVENOUS

## 2017-11-04 MED ORDER — LIDOCAINE HCL (CARDIAC) PF 100 MG/5ML IV SOSY
PREFILLED_SYRINGE | INTRAVENOUS | Status: DC | PRN
  Administered 2017-11-04: 20 mg via INTRATRACHEAL

## 2017-11-04 MED ORDER — ERYTHROMYCIN 5 MG/GM OP OINT
TOPICAL_OINTMENT | OPHTHALMIC | Status: DC | PRN
Start: 2017-11-04 — End: 2017-11-04
  Administered 2017-11-04: 1 via OPHTHALMIC

## 2017-11-04 MED ORDER — ERYTHROMYCIN 5 MG/GM OP OINT: TOPICAL_OINTMENT | OPHTHALMIC | 3 refills | Status: DC

## 2017-11-04 MED ORDER — BSS IO SOLN
INTRAOCULAR | Status: DC | PRN
  Administered 2017-11-04: 15 mL via INTRAOCULAR

## 2017-11-04 MED ORDER — MIDAZOLAM HCL 5 MG/5ML IJ SOLN
INTRAMUSCULAR | Status: DC | PRN
  Administered 2017-11-04: 2 mg via INTRAVENOUS

## 2017-11-04 MED ORDER — ALFENTANIL 500 MCG/ML IJ INJ
INJECTION | INTRAVENOUS | Status: DC | PRN
  Administered 2017-11-04: 500 ug via INTRAVENOUS

## 2017-11-04 SURGICAL SUPPLY — 37 items
APPLICATOR COTTON TIP WD 3 STR (MISCELLANEOUS) ×2 IMPLANT
BLADE SURG 15 STRL LF DISP TIS (BLADE) ×1 IMPLANT
BLADE SURG 15 STRL SS (BLADE) ×2
CORD BIP STRL DISP 12FT (MISCELLANEOUS) ×2 IMPLANT
DRAPE HEAD BAR (DRAPES) ×2 IMPLANT
GAUZE SPONGE 4X4 12PLY STRL (GAUZE/BANDAGES/DRESSINGS) ×2 IMPLANT
GAUZE SPONGE NON-WVN 2X2 STRL (MISCELLANEOUS) ×10 IMPLANT
GLOVE SURG LX 7.0 MICRO (GLOVE) ×2
GLOVE SURG LX STRL 7.0 MICRO (GLOVE) ×2 IMPLANT
MARKER SKIN XFINE TIP W/RULER (MISCELLANEOUS) ×2 IMPLANT
NDL FILTER BLUNT 18X1 1/2 (NEEDLE) ×1 IMPLANT
NDL HYPO 30X.5 LL (NEEDLE) ×2 IMPLANT
NEEDLE FILTER BLUNT 18X 1/2SAF (NEEDLE) ×1
NEEDLE FILTER BLUNT 18X1 1/2 (NEEDLE) ×1 IMPLANT
NEEDLE HYPO 30X.5 LL (NEEDLE) ×4 IMPLANT
PACK DRAPE NASAL/ENT (PACKS) ×2 IMPLANT
SOL PREP PVP 2OZ (MISCELLANEOUS) ×2
SOLUTION PREP PVP 2OZ (MISCELLANEOUS) ×1 IMPLANT
SPONGE VERSALON 2X2 STRL (MISCELLANEOUS) ×20
SUT CHROMIC 4-0 (SUTURE)
SUT CHROMIC 4-0 M2 12X2 ARM (SUTURE)
SUT CHROMIC 5 0 P 3 (SUTURE) IMPLANT
SUT ETHILON 4 0 CL P 3 (SUTURE) IMPLANT
SUT MERSILENE 4-0 S-2 (SUTURE) IMPLANT
SUT PLAIN GUT (SUTURE) ×2 IMPLANT
SUT PROLENE 5 0 P 3 (SUTURE) IMPLANT
SUT PROLENE 6 0 P 1 18 (SUTURE) IMPLANT
SUT SILK 4 0 G 3 (SUTURE) IMPLANT
SUT VIC AB 5-0 P-3 18X BRD (SUTURE) IMPLANT
SUT VIC AB 5-0 P3 18 (SUTURE)
SUT VICRYL 6-0  S14 CTD (SUTURE)
SUT VICRYL 6-0 S14 CTD (SUTURE) IMPLANT
SUT VICRYL 7 0 TG140 8 (SUTURE) IMPLANT
SUTURE CHRMC 4-0 M2 12X2 ARM (SUTURE) IMPLANT
SYR 3ML LL SCALE MARK (SYRINGE) ×2 IMPLANT
SYRINGE 10CC LL (SYRINGE) ×2 IMPLANT
WATER STERILE IRR 250ML POUR (IV SOLUTION) ×2 IMPLANT

## 2017-11-04 NOTE — Anesthesia Procedure Notes (Signed)
Procedure Name: LMA Insertion Date/Time: 11/04/2017 9:27 AM Performed by: Lind Guest, CRNA Pre-anesthesia Checklist: Patient identified, Patient being monitored, Timeout performed, Emergency Drugs available and Suction available Patient Re-evaluated:Patient Re-evaluated prior to induction Oxygen Delivery Method: Circle system utilized Preoxygenation: Pre-oxygenation with 100% oxygen Induction Type: IV induction Ventilation: Mask ventilation without difficulty LMA: LMA inserted LMA Size: 4.0 Tube type: Oral Number of attempts: 1 Placement Confirmation: positive ETCO2 and breath sounds checked- equal and bilateral Tube secured with: Tape Dental Injury: Teeth and Oropharynx as per pre-operative assessment

## 2017-11-04 NOTE — Anesthesia Postprocedure Evaluation (Signed)
Anesthesia Post Note  Patient: Mckenzie Lawrence  Procedure(s) Performed: BLEPHAROPLASTY UPPER EYELID W/EXCESS SKIN (Bilateral Eye)  Patient location during evaluation: PACU Anesthesia Type: General Level of consciousness: awake Pain management: pain level controlled Vital Signs Assessment: post-procedure vital signs reviewed and stable Respiratory status: respiratory function stable Cardiovascular status: stable Postop Assessment: no signs of nausea or vomiting Anesthetic complications: no    Veda Canning

## 2017-11-04 NOTE — H&P (Signed)
See the history and physical completed at [facility] on 10/21/17 and scanned into the chart.

## 2017-11-04 NOTE — Interval H&P Note (Signed)
History and Physical Interval Note:  11/04/2017 9:01 AM  Mckenzie Lawrence  has presented today for surgery, with the diagnosis of H02.831  H02.834 DERMATOCHALASIS  The various methods of treatment have been discussed with the patient and family. After consideration of risks, benefits and other options for treatment, the patient has consented to  Procedure(s) with comments: BLEPHAROPLASTY UPPER EYELID W/EXCESS SKIN (Bilateral) - DIABETES-insulin dependent uses CPAP as a surgical intervention .  The patient's history has been reviewed, patient examined, no change in status, stable for surgery.  I have reviewed the patient's chart and labs.  Questions were answered to the patient's satisfaction.     Vickki Muff, Amy M

## 2017-11-04 NOTE — Op Note (Signed)
Preoperative Diagnosis:  Visually significant dermatochalasis bilateral upper Eyelid(s)  Postoperative Diagnosis:  Same.  Procedure(s) Performed:   Upper eyelid blepharoplasty with excess skin, fat and muscle excision bilateral upper Eyelid(s)  Teaching Surgeon: Philis Pique. Vickki Muff, M.D.  Assistants: none  Anesthesia: MAC  Specimens: None.  Estimated Blood Loss: Minimal.  Complications: None.  Operative Findings: None Dictated  Procedure:   Allergies were reviewed and the patient is allergic to Iodine; Shellfish allergy; Codeine; Dorzolamide; Irbesartan; Morphine sulfate; and Sulfa antibiotics.   After the risks, benefits, complications and alternatives were discussed with the patient, appropriate informed consent was obtained and the patient was brought to the operating suite. The patient was reclined supine and a timeout was conducted.  The patient was then sedated.  Local anesthetic consisting of a 50-50 mixture of 2% lidocaine with epinephrine and 0.75% bupivacaine with added Hylenex was injected subcutaneously to both upper eyelid(s). After adequate local was instilled, the patient was prepped and draped in the usual sterile fashion for eyelid surgery.   Attention was turned to the upper eyelids. A 57m upper eyelid crease incision line was marked with calipers on both upper eyelid(s).  A pinch test was used to estimate the amount of excess skin to remove and this was marked in standard blepharoplasty style fashion. Attention was turned to the right upper eyelid. A #15 blade was used to open the premarked incision line. A skin and muscle flap was excised and hemostasis was obtained with bipolar cautery.   The orbital septum was opened for the length of the incision to reveal the central and medial fat pockets.  They were dissected free from fascial attachments, cauterized towards the pedicle base and excised to produce a nice flattening of the upper eyelid.  Attention was then  turned to the opposite eyelid where the same procedure was performed in the same manner. Hemostasis was obtained with bipolar cautery throughout. All incisions were then closed with a combination of running and interrupted 6-0 fast absorbing plain suture. The patient tolerated the procedure well.  Erythromycin ophthalmic ointment was applied to her incision sites, followed by ice packs. She was taken to the recovery area where she recovered without difficulty.  Post-Op Plan/Instructions:  The patient was instructed to use ice packs frequently for the next 48 hours. She was instructed to use erythromycin ophthalmic ointment on her incisions 4 times a day for the next 12 to 14 days. She was given a prescription for Percocet for pain control should Tylenol not be effective. She was [facility] [place] needed [place] problems.  Amiere Cawley M. FVickki Muff M.D. Attending,Ophthalmology

## 2017-11-04 NOTE — Transfer of Care (Signed)
Immediate Anesthesia Transfer of Care Note  Patient: Mckenzie Lawrence  Procedure(s) Performed: BLEPHAROPLASTY UPPER EYELID W/EXCESS SKIN (Bilateral Eye)  Patient Location: PACU  Anesthesia Type: General  Level of Consciousness: awake, alert  and patient cooperative  Airway and Oxygen Therapy: Patient Spontanous Breathing and Patient connected to supplemental oxygen  Post-op Assessment: Post-op Vital signs reviewed, Patient's Cardiovascular Status Stable, Respiratory Function Stable, Patent Airway and No signs of Nausea or vomiting  Post-op Vital Signs: Reviewed and stable  Complications: No apparent anesthesia complications

## 2017-11-04 NOTE — Anesthesia Preprocedure Evaluation (Addendum)
Anesthesia Evaluation  Patient identified by MRN, date of birth, ID band Patient awake    Reviewed: Allergy & Precautions, NPO status , Patient's Chart, lab work & pertinent test results  Airway Mallampati: II  TM Distance: >3 FB     Dental   Pulmonary sleep apnea (heavy smoker - 1.5 packs per day, smoked today) , COPD, neg recent URI, Current Smoker,    breath sounds clear to auscultation       Cardiovascular hypertension, (-) angina Rhythm:Regular Rate:Normal  Hyperlipidemia     Neuro/Psych CVA ( Lacunar stroke of left subthalamic region 2016)    GI/Hepatic neg GERD  ,  Endo/Other  diabetes, Type 2PCOS Obesity - BMI 31  Renal/GU      Musculoskeletal   Abdominal   Peds  Hematology   Anesthesia Other Findings   Reproductive/Obstetrics                           Anesthesia Physical Anesthesia Plan  ASA: III  Anesthesia Plan: General   Post-op Pain Management:    Induction: Intravenous  PONV Risk Score and Plan: Midazolam and Ondansetron  Airway Management Planned: LMA  Additional Equipment:   Intra-op Plan:   Post-operative Plan:   Informed Consent: I have reviewed the patients History and Physical, chart, labs and discussed the procedure including the risks, benefits and alternatives for the proposed anesthesia with the patient or authorized representative who has indicated his/her understanding and acceptance.     Plan Discussed with: CRNA  Anesthesia Plan Comments:      Anesthesia Quick Evaluation

## 2017-11-05 ENCOUNTER — Encounter: Payer: Self-pay | Admitting: Ophthalmology

## 2017-11-05 DIAGNOSIS — R350 Frequency of micturition: Secondary | ICD-10-CM | POA: Diagnosis not present

## 2017-11-05 DIAGNOSIS — E119 Type 2 diabetes mellitus without complications: Secondary | ICD-10-CM | POA: Diagnosis not present

## 2017-11-05 DIAGNOSIS — I10 Essential (primary) hypertension: Secondary | ICD-10-CM | POA: Diagnosis not present

## 2017-12-16 DIAGNOSIS — E871 Hypo-osmolality and hyponatremia: Secondary | ICD-10-CM | POA: Diagnosis not present

## 2017-12-24 DIAGNOSIS — H353131 Nonexudative age-related macular degeneration, bilateral, early dry stage: Secondary | ICD-10-CM | POA: Diagnosis not present

## 2018-01-07 DIAGNOSIS — M65332 Trigger finger, left middle finger: Secondary | ICD-10-CM | POA: Diagnosis not present

## 2018-01-07 DIAGNOSIS — G4733 Obstructive sleep apnea (adult) (pediatric): Secondary | ICD-10-CM | POA: Diagnosis not present

## 2018-01-07 DIAGNOSIS — M65321 Trigger finger, right index finger: Secondary | ICD-10-CM | POA: Diagnosis not present

## 2018-01-26 DIAGNOSIS — M6530 Trigger finger, unspecified finger: Secondary | ICD-10-CM | POA: Insufficient documentation

## 2018-01-26 DIAGNOSIS — M65332 Trigger finger, left middle finger: Secondary | ICD-10-CM | POA: Diagnosis not present

## 2018-01-27 DIAGNOSIS — H353132 Nonexudative age-related macular degeneration, bilateral, intermediate dry stage: Secondary | ICD-10-CM | POA: Diagnosis not present

## 2018-01-28 DIAGNOSIS — H26491 Other secondary cataract, right eye: Secondary | ICD-10-CM | POA: Diagnosis not present

## 2018-02-02 DIAGNOSIS — L02224 Furuncle of groin: Secondary | ICD-10-CM | POA: Diagnosis not present

## 2018-02-04 DIAGNOSIS — M545 Low back pain, unspecified: Secondary | ICD-10-CM | POA: Insufficient documentation

## 2018-02-04 DIAGNOSIS — M48061 Spinal stenosis, lumbar region without neurogenic claudication: Secondary | ICD-10-CM | POA: Insufficient documentation

## 2018-02-04 DIAGNOSIS — M5136 Other intervertebral disc degeneration, lumbar region: Secondary | ICD-10-CM | POA: Diagnosis not present

## 2018-02-04 DIAGNOSIS — M4310 Spondylolisthesis, site unspecified: Secondary | ICD-10-CM | POA: Diagnosis not present

## 2018-02-04 DIAGNOSIS — M419 Scoliosis, unspecified: Secondary | ICD-10-CM | POA: Diagnosis not present

## 2018-02-06 DIAGNOSIS — M419 Scoliosis, unspecified: Secondary | ICD-10-CM | POA: Insufficient documentation

## 2018-02-06 DIAGNOSIS — M5136 Other intervertebral disc degeneration, lumbar region: Secondary | ICD-10-CM | POA: Insufficient documentation

## 2018-02-06 DIAGNOSIS — M4310 Spondylolisthesis, site unspecified: Secondary | ICD-10-CM | POA: Insufficient documentation

## 2018-02-07 ENCOUNTER — Telehealth: Payer: Self-pay

## 2018-02-07 NOTE — Telephone Encounter (Signed)
Call pt regarding lung screening pt is a current smoker , smoking about 2 pack per day. Would like any Tuesday afternoon, No new medical problems.

## 2018-02-09 ENCOUNTER — Other Ambulatory Visit: Payer: Self-pay | Admitting: *Deleted

## 2018-02-09 DIAGNOSIS — Z122 Encounter for screening for malignant neoplasm of respiratory organs: Secondary | ICD-10-CM

## 2018-02-17 ENCOUNTER — Telehealth: Payer: Self-pay | Admitting: *Deleted

## 2018-02-17 NOTE — Telephone Encounter (Signed)
Called patient about upcoming LDCT appt for Tuesday 03/03/2018 @ 1:10pm here @ [facility].voiced understanding.

## 2018-02-19 ENCOUNTER — Encounter: Payer: Self-pay | Admitting: Gynecology

## 2018-02-19 ENCOUNTER — Ambulatory Visit: Payer: PPO | Admitting: Gynecology

## 2018-02-19 VITALS — BP 130/82 | Ht 67.0 in | Wt 211.0 lb

## 2018-02-19 DIAGNOSIS — Z01419 Encounter for gynecological examination (general) (routine) without abnormal findings: Secondary | ICD-10-CM

## 2018-02-19 DIAGNOSIS — N952 Postmenopausal atrophic vaginitis: Secondary | ICD-10-CM

## 2018-02-19 DIAGNOSIS — M8580 Other specified disorders of bone density and structure, unspecified site: Secondary | ICD-10-CM

## 2018-02-19 NOTE — Progress Notes (Signed)
    Mckenzie Lawrence 16-Apr-1946 [id number]        72 y.o.  G0P0 for breast and pelvic exam.  Doing well without GYN complaints.  Past medical history,surgical history, problem list, medications, allergies, family history and social history were all reviewed and documented as reviewed in the EPIC chart.  ROS:  Performed with pertinent positives and negatives included in the history, assessment and plan.   Additional significant findings : None   Exam: Caryn Bee assistant Vitals:   02/19/18 1525  BP: 130/82  Weight: 211 lb (95.7 kg)  Height: 5\' 7"  (1.702 m)   Body mass index is 33.05 kg/m.  General appearance:  Normal affect, orientation and appearance. Skin: Grossly normal HEENT: Without gross lesions.  No cervical or supraclavicular adenopathy. Thyroid normal.  Lungs:  Clear without wheezing, rales or rhonchi Cardiac: RR, without RMG Abdominal:  Soft, nontender, without masses, guarding, rebound, organomegaly or hernia Breasts:  Examined lying and sitting without masses, retractions, discharge or axillary adenopathy. Pelvic:  Ext, BUS, Vagina: With atrophic changes  Adnexa: Without masses or tenderness    Anus and perineum: Normal   Rectovaginal: Normal sphincter tone without palpated masses or tenderness.    Assessment/Plan:  72 y.o. G0P0 female for breast and pelvic exam.  Status post TVH BSO with pubovaginal sling.  1. Postmenopausal/atrophic genital changes. 2. Osteopenia.  DEXA 2018 T score -2.0 stable from prior DEXA.  Had been on Prolia for 4 years stopped 2016.  We will plan follow-up DEXA next year at 2-year interval. 3. Mammography coming due in October and she will call and schedule.  Breast exam normal today. 4. Colonoscopy 8 years ago.  Repeat at their recommended interval. 5. Pap smear 2011.  No Pap smear done today.  No history of abnormal Pap smears previously.  We discussed current screening recommendations and patient is comfortable with no screening based  on age and hysterectomy history. 6. Health maintenance.  No routine lab work done as patient does this elsewhere.  Follow-up 1 year, sooner as needed.   Anastasio Auerbach MD, 4:08 PM 02/19/2018

## 2018-02-19 NOTE — Patient Instructions (Signed)
Follow-up as needed if any issues.

## 2018-03-03 ENCOUNTER — Ambulatory Visit
Admission: RE | Admit: 2018-03-03 | Discharge: 2018-03-03 | Disposition: A | Discharge status: Home / Self Care | Payer: PPO | Source: Ambulatory Visit | Attending: Oncology | Admitting: Oncology

## 2018-03-03 DIAGNOSIS — F17210 Nicotine dependence, cigarettes, uncomplicated: Secondary | ICD-10-CM | POA: Diagnosis not present

## 2018-03-03 DIAGNOSIS — I2510 Atherosclerotic heart disease of native coronary artery without angina pectoris: Secondary | ICD-10-CM | POA: Insufficient documentation

## 2018-03-03 DIAGNOSIS — Z122 Encounter for screening for malignant neoplasm of respiratory organs: Secondary | ICD-10-CM | POA: Diagnosis not present

## 2018-03-03 DIAGNOSIS — I700 Atherosclerosis of aorta: Secondary | ICD-10-CM | POA: Insufficient documentation

## 2018-03-03 DIAGNOSIS — J432 Centrilobular emphysema: Secondary | ICD-10-CM | POA: Insufficient documentation

## 2018-03-03 DIAGNOSIS — J438 Other emphysema: Secondary | ICD-10-CM | POA: Insufficient documentation

## 2018-03-04 ENCOUNTER — Encounter: Payer: Self-pay | Admitting: *Deleted

## 2018-03-05 DIAGNOSIS — N183 Chronic kidney disease, stage 3 (moderate): Secondary | ICD-10-CM | POA: Diagnosis not present

## 2018-03-06 DIAGNOSIS — N183 Chronic kidney disease, stage 3 (moderate): Secondary | ICD-10-CM | POA: Diagnosis not present

## 2018-03-07 ENCOUNTER — Ambulatory Visit
Admission: EM | Admit: 2018-03-07 | Discharge: 2018-03-07 | Disposition: A | Discharge status: Home / Self Care | Payer: PPO | Attending: Family Medicine | Admitting: Family Medicine

## 2018-03-07 ENCOUNTER — Other Ambulatory Visit: Payer: Self-pay

## 2018-03-07 ENCOUNTER — Encounter: Payer: Self-pay | Admitting: Emergency Medicine

## 2018-03-07 DIAGNOSIS — M5432 Sciatica, left side: Secondary | ICD-10-CM | POA: Diagnosis not present

## 2018-03-07 MED ORDER — HYDROCODONE-ACETAMINOPHEN 5-325 MG PO TABS: ORAL_TABLET | ORAL | 0 refills | Status: DC

## 2018-03-07 MED ORDER — ONDANSETRON 8 MG PO TBDP: 8.0000 mg | ORAL_TABLET | Freq: Three times a day (TID) | ORAL | 0 refills | Status: DC | PRN

## 2018-03-07 MED ORDER — PREDNISONE 20 MG PO TABS: ORAL_TABLET | ORAL | 0 refills | Status: DC

## 2018-03-07 NOTE — ED Triage Notes (Signed)
Patient c/o sciatica pain that started 3 weeks ago. Patient stated her pain is getting worse. She has been taking OTC Ibuprofen with no relief.

## 2018-03-07 NOTE — ED Provider Notes (Signed)
[facility]    CSN: [id number] Arrival date & time: 03/07/18  1409     History   Chief Complaint Chief Complaint  Patient presents with  . Sciatica    HPI Mckenzie Lawrence is a 72 y.o. female.   72 yo female with a c/o left side low back pain radiating down into the left buttock and back of left leg for the last 2-3 weeks. Symptoms have been progressively worsening. Has been taking otc ibuprofen without relief.   The history is provided by the patient.    Past Medical History:  Diagnosis Date  . Broken foot   . Cataracts, bilateral   . COPD (chronic obstructive pulmonary disease) (HCC)    wheezing  . CVA (cerebral vascular accident) ([place]) 2016  . Diabetes mellitus    insulin dependent, Type 2  . HLD (hyperlipidemia)   . Hx of cardiovascular stress test    a. ETT (6/13):  Ex 5:13; no ischemic changes  . Hypertension    controlled on meds  . Lacunar stroke of left subthalamic region [facility]) 02/2015  . Neuromuscular disorder ([place])    stroke right hand tingling  . Orthostatic hypotension   . Osteopenia 01/2017   T score -2.0 stable from prior DEXA  . PCOS (polycystic ovarian syndrome)   . Personal history of tobacco use, presenting hazards to health 01/09/2015  . Sleep apnea    borderline/ CPAP    Patient Active Problem List   Diagnosis Date Noted  . CVA (cerebral vascular accident) ([place]) 03/25/2016  . Hyponatremia 09/25/2015  . Vitamin B12 deficiency 09/12/2015  . Lacunar stroke of left subthalamic region ([place]) 09/12/2015  . Diabetic polyneuropathy associated with diabetes mellitus due to underlying condition ([place]) 09/12/2015  . Stroke ([place]) 03/15/2015  . Accelerated hypertension 03/15/2015  . Syncope 12/04/2011  . Tobacco abuse 12/04/2011  . Elevated cholesterol   . Cataracts, bilateral   . Hypertension   . Broken foot   . PCOS (polycystic ovarian syndrome)   . Type II diabetes mellitus ([place])   . Osteopenia     Past Surgical History:    Procedure Laterality Date  . BROW LIFT Bilateral 11/04/2017   Procedure: BLEPHAROPLASTY UPPER EYELID W/EXCESS SKIN;  Surgeon: Karle Starch, MD;  Location: [place];  Service: Ophthalmology;  Laterality: Bilateral;  DIABETES-insulin dependent uses CPAP  . CARDIAC CATHETERIZATION  20 yrs ago   found nothing  . CARPAL TUNNEL RELEASE Bilateral   . CATARACT EXTRACTION Bilateral   . ELBOW SURGERY Bilateral   . FOOT SURGERY    . Groin Abscess    . HAND SURGERY    . KNEE SURGERY Bilateral   . LABIAL ABSCESS    . OOPHORECTOMY     BSO  . PUBO VAG SLING    . SHOULDER SURGERY     bilateral arthroscopies  . VAGINAL HYSTERECTOMY  1979    OB History    Gravida  0   Para      Term      Preterm      AB      Living        SAB      TAB      Ectopic      Multiple      Live Births               Home Medications    Prior to Admission medications   Medication Sig Start Date End Date Taking?  Authorizing Provider  aspirin 81 MG chewable tablet Chew 81 mg by mouth daily. dinner   Yes [provider]  clopidogrel (PLAVIX) 75 MG tablet Take 75 mg by mouth at bedtime. dinner   Yes [provider]  insulin detemir (LEVEMIR) 100 UNIT/ML injection Inject 50 Units into the skin daily. Pt uses 50 units in the morning   Yes [provider]  losartan (COZAAR) 100 MG tablet Take 1 tablet (100 mg total) by mouth daily. Patient taking differently: Take 100 mg by mouth daily. am 09/26/15  Yes Gladstone Lighter, MD  metFORMIN (GLUCOPHAGE) 1000 MG tablet Take 1,000 mg by mouth 2 (two) times daily with a meal.   Yes [provider]  metoprolol succinate (TOPROL-XL) 25 MG 24 hr tablet Take 25 mg by mouth daily. am   Yes [provider]  oxybutynin (DITROPAN) 5 MG tablet Take 5 mg by mouth daily. am   Yes [provider]  pravastatin (PRAVACHOL) 40 MG tablet Take 40 mg by mouth daily. am   Yes [provider]   HYDROcodone-acetaminophen (NORCO/VICODIN) 5-325 MG tablet 1-2 tabs po bid prn 03/07/18   Norval Gable, MD  ondansetron (ZOFRAN ODT) 8 MG disintegrating tablet Take 1 tablet (8 mg total) by mouth every 8 (eight) hours as needed. 03/07/18   Norval Gable, MD  predniSONE (DELTASONE) 20 MG tablet 2 tabs po qd x 5 days 03/07/18   Norval Gable, MD    Family History Family History  Problem Relation Age of Onset  . Hypertension Mother   . Heart disease Mother 39       MI  . Diabetes Father   . Diabetes Sister   . Hypertension Sister   . Diabetes Brother   . Hypertension Brother   . Heart disease Brother 26       CAD  . Cancer Sister        Multiple myloma  . Diabetes Brother     Social History Social History   Tobacco Use  . Smoking status: Current Every Day Smoker    Packs/day: 1.50    Years: 50.00    Pack years: 75.00    Types: Cigarettes  . Smokeless tobacco: Never Used  . Tobacco comment: 3ppd x 10 year, then cut back to 1.5pdd since 02/2015  Substance Use Topics  . Alcohol use: Never    Alcohol/week: 0.0 standard drinks    Frequency: Never  . Drug use: No     Allergies   Iodine; Shellfish allergy; Codeine; Dorzolamide; Irbesartan; Morphine sulfate; and Sulfa antibiotics   Review of Systems Review of Systems   Physical Exam Triage Vital Signs ED Triage Vitals  Enc Vitals Group     BP 03/07/18 1435 (!) 169/77     Pulse Rate 03/07/18 1435 84     Resp 03/07/18 1435 16     Temp 03/07/18 1435 98.2 F (36.8 C)     Temp Source 03/07/18 1435 Oral     SpO2 03/07/18 1435 100 %     Weight 03/07/18 1436 210 lb (95.3 kg)     Height 03/07/18 1436 5\' 8"  (1.727 m)     Head Circumference --      Peak Flow --      Pain Score 03/07/18 1436 7     Pain Loc --      Pain Edu? --      Excl. in [place]? --    No data found.  Updated Vital Signs BP Marland Kitchen)  169/77 (BP Location: Right Arm)   Pulse 84   Temp 98.2 F (36.8 C) (Oral)   Resp 16   Ht 5\' 8"  (1.727 m)   Wt 95.3 kg    SpO2 100%   BMI 31.93 kg/m   Visual Acuity Right Eye Distance:   Left Eye Distance:   Bilateral Distance:    Right Eye Near:   Left Eye Near:    Bilateral Near:     Physical Exam  Constitutional: She appears well-developed and well-nourished. No distress.  Musculoskeletal:       Lumbar back: She exhibits tenderness (over the left buttock). She exhibits normal range of motion, no bony tenderness, no swelling, no edema, no deformity, no laceration, no pain, no spasm and normal pulse.  Skin: She is not diaphoretic.  Nursing note and vitals reviewed.    UC Treatments / Results  Labs (all labs ordered are listed, but only abnormal results are displayed) Labs Reviewed - No data to display  EKG None  Radiology No results found.  Procedures Procedures (including critical care time)  Medications Ordered in UC Medications - No data to display  Initial Impression / Assessment and Plan / UC Course  I have reviewed the triage vital signs and the nursing notes.  Pertinent labs & imaging results that were available during my care of the patient were reviewed by me and considered in my medical decision making (see chart for details).      Final Clinical Impressions(s) / UC Diagnoses   Final diagnoses:  Sciatica of left side   Discharge Instructions   None    ED Prescriptions    Medication Sig Dispense Auth. Provider   predniSONE (DELTASONE) 20 MG tablet 2 tabs po qd x 5 days 10 tablet Norval Gable, MD   HYDROcodone-acetaminophen (NORCO/VICODIN) 5-325 MG tablet 1-2 tabs po bid prn 10 tablet Pooja Camuso, MD   ondansetron (ZOFRAN ODT) 8 MG disintegrating tablet Take 1 tablet (8 mg total) by mouth every 8 (eight) hours as needed. 6 tablet Norval Gable, MD     1. diagnosis reviewed with patient 2. rx as per orders above; reviewed possible side effects, interactions, risks and benefits  3. Recommend supportive treatment with rest, heat/ice 4. Follow-up prn if  symptoms worsen or don't improve   Controlled Substance Prescriptions [place] Controlled Substance Registry consulted? Not Applicable   Norval Gable, MD 03/07/18 [phone]

## 2018-03-09 DIAGNOSIS — N183 Chronic kidney disease, stage 3 (moderate): Secondary | ICD-10-CM | POA: Diagnosis not present

## 2018-03-09 DIAGNOSIS — I10 Essential (primary) hypertension: Secondary | ICD-10-CM | POA: Diagnosis not present

## 2018-03-09 DIAGNOSIS — M5432 Sciatica, left side: Secondary | ICD-10-CM | POA: Diagnosis not present

## 2018-03-10 ENCOUNTER — Other Ambulatory Visit: Payer: Self-pay | Admitting: Neurology

## 2018-03-10 ENCOUNTER — Ambulatory Visit
Admission: RE | Admit: 2018-03-10 | Discharge: 2018-03-10 | Disposition: A | Discharge status: Home / Self Care | Payer: PPO | Source: Ambulatory Visit | Attending: Neurology | Admitting: Neurology

## 2018-03-10 DIAGNOSIS — M5126 Other intervertebral disc displacement, lumbar region: Secondary | ICD-10-CM | POA: Insufficient documentation

## 2018-03-10 DIAGNOSIS — M48061 Spinal stenosis, lumbar region without neurogenic claudication: Secondary | ICD-10-CM | POA: Diagnosis not present

## 2018-03-10 DIAGNOSIS — M5442 Lumbago with sciatica, left side: Secondary | ICD-10-CM | POA: Insufficient documentation

## 2018-03-10 DIAGNOSIS — G8929 Other chronic pain: Secondary | ICD-10-CM

## 2018-03-10 DIAGNOSIS — M545 Low back pain: Secondary | ICD-10-CM | POA: Diagnosis not present

## 2018-03-13 DIAGNOSIS — M5416 Radiculopathy, lumbar region: Secondary | ICD-10-CM | POA: Diagnosis not present

## 2018-03-13 DIAGNOSIS — M5136 Other intervertebral disc degeneration, lumbar region: Secondary | ICD-10-CM | POA: Diagnosis not present

## 2018-03-13 DIAGNOSIS — M5126 Other intervertebral disc displacement, lumbar region: Secondary | ICD-10-CM | POA: Diagnosis not present

## 2018-03-16 DIAGNOSIS — M5126 Other intervertebral disc displacement, lumbar region: Secondary | ICD-10-CM | POA: Diagnosis not present

## 2018-03-16 DIAGNOSIS — M5416 Radiculopathy, lumbar region: Secondary | ICD-10-CM | POA: Diagnosis not present

## 2018-03-19 ENCOUNTER — Other Ambulatory Visit: Payer: Self-pay | Admitting: Cardiology

## 2018-03-19 ENCOUNTER — Ambulatory Visit
Admission: RE | Admit: 2018-03-19 | Discharge: 2018-03-19 | Disposition: A | Discharge status: Home / Self Care | Payer: PPO | Source: Ambulatory Visit | Attending: Cardiology | Admitting: Cardiology

## 2018-03-19 DIAGNOSIS — I6381 Other cerebral infarction due to occlusion or stenosis of small artery: Secondary | ICD-10-CM | POA: Diagnosis not present

## 2018-03-19 DIAGNOSIS — Z720 Tobacco use: Secondary | ICD-10-CM | POA: Diagnosis not present

## 2018-03-19 DIAGNOSIS — E1122 Type 2 diabetes mellitus with diabetic chronic kidney disease: Secondary | ICD-10-CM | POA: Diagnosis not present

## 2018-03-19 DIAGNOSIS — I639 Cerebral infarction, unspecified: Secondary | ICD-10-CM | POA: Diagnosis not present

## 2018-03-19 DIAGNOSIS — I10 Essential (primary) hypertension: Secondary | ICD-10-CM | POA: Diagnosis not present

## 2018-03-19 DIAGNOSIS — E785 Hyperlipidemia, unspecified: Secondary | ICD-10-CM | POA: Diagnosis not present

## 2018-03-19 DIAGNOSIS — M79605 Pain in left leg: Secondary | ICD-10-CM | POA: Insufficient documentation

## 2018-03-19 DIAGNOSIS — R600 Localized edema: Secondary | ICD-10-CM | POA: Diagnosis not present

## 2018-03-19 DIAGNOSIS — Z794 Long term (current) use of insulin: Secondary | ICD-10-CM | POA: Diagnosis not present

## 2018-03-19 DIAGNOSIS — E1165 Type 2 diabetes mellitus with hyperglycemia: Secondary | ICD-10-CM | POA: Diagnosis not present

## 2018-03-19 DIAGNOSIS — G4733 Obstructive sleep apnea (adult) (pediatric): Secondary | ICD-10-CM | POA: Diagnosis not present

## 2018-03-24 DIAGNOSIS — M5416 Radiculopathy, lumbar region: Secondary | ICD-10-CM | POA: Diagnosis not present

## 2018-03-26 DIAGNOSIS — Z1231 Encounter for screening mammogram for malignant neoplasm of breast: Secondary | ICD-10-CM | POA: Diagnosis not present

## 2018-04-09 DIAGNOSIS — M5416 Radiculopathy, lumbar region: Secondary | ICD-10-CM | POA: Diagnosis not present

## 2018-04-09 DIAGNOSIS — M5126 Other intervertebral disc displacement, lumbar region: Secondary | ICD-10-CM | POA: Diagnosis not present

## 2018-04-16 DIAGNOSIS — R8279 Other abnormal findings on microbiological examination of urine: Secondary | ICD-10-CM | POA: Diagnosis not present

## 2018-04-16 DIAGNOSIS — N3020 Other chronic cystitis without hematuria: Secondary | ICD-10-CM | POA: Diagnosis not present

## 2018-04-28 ENCOUNTER — Other Ambulatory Visit: Payer: PPO

## 2018-04-28 DIAGNOSIS — M5416 Radiculopathy, lumbar region: Secondary | ICD-10-CM | POA: Diagnosis not present

## 2018-04-28 DIAGNOSIS — M4726 Other spondylosis with radiculopathy, lumbar region: Secondary | ICD-10-CM | POA: Diagnosis not present

## 2018-05-06 ENCOUNTER — Ambulatory Visit
Admission: RE | Admit: 2018-05-06 | Discharge: 2018-05-06 | Disposition: A | Discharge status: Home / Self Care | Payer: PPO | Source: Ambulatory Visit | Attending: Neurosurgery | Admitting: Neurosurgery

## 2018-05-06 ENCOUNTER — Other Ambulatory Visit: Payer: Self-pay

## 2018-05-06 ENCOUNTER — Encounter
Admission: RE | Admit: 2018-05-06 | Discharge: 2018-05-06 | Disposition: A | Discharge status: Home / Self Care | Payer: PPO | Source: Ambulatory Visit | Attending: Neurosurgery | Admitting: Neurosurgery

## 2018-05-06 DIAGNOSIS — Z01810 Encounter for preprocedural cardiovascular examination: Secondary | ICD-10-CM

## 2018-05-06 DIAGNOSIS — Z01818 Encounter for other preprocedural examination: Secondary | ICD-10-CM | POA: Insufficient documentation

## 2018-05-06 DIAGNOSIS — J9811 Atelectasis: Secondary | ICD-10-CM | POA: Insufficient documentation

## 2018-05-06 HISTORY — DX: Pain in leg, unspecified: M79.606

## 2018-05-06 HISTORY — DX: Low back pain, unspecified: M54.50

## 2018-05-06 HISTORY — DX: Unspecified urinary incontinence: R32

## 2018-05-06 HISTORY — DX: Unspecified osteoarthritis, unspecified site: M19.90

## 2018-05-06 HISTORY — DX: Chronic kidney disease, unspecified: N18.9

## 2018-05-06 HISTORY — DX: Low back pain: M54.5

## 2018-05-06 LAB — URINALYSIS, COMPLETE (UACMP) WITH MICROSCOPIC
BILIRUBIN URINE: NEGATIVE
Bacteria, UA: NONE SEEN
GLUCOSE, UA: NEGATIVE mg/dL
HGB URINE DIPSTICK: NEGATIVE
Ketones, ur: NEGATIVE mg/dL
Leukocytes, UA: NEGATIVE
NITRITE: NEGATIVE
Protein, ur: 100 mg/dL — AB
SPECIFIC GRAVITY, URINE: 1.012 (ref 1.005–1.030)
pH: 6 (ref 5.0–8.0)

## 2018-05-06 LAB — BASIC METABOLIC PANEL
Anion gap: 12 (ref 5–15)
BUN: 18 mg/dL (ref 8–23)
CALCIUM: 9.2 mg/dL (ref 8.9–10.3)
CHLORIDE: 100 mmol/L (ref 98–111)
CO2: 23 mmol/L (ref 22–32)
CREATININE: 1.41 mg/dL — AB (ref 0.44–1.00)
GFR calc Af Amer: 43 mL/min — ABNORMAL LOW (ref >60)
GFR calc non Af Amer: 37 mL/min — ABNORMAL LOW (ref >60)
GLUCOSE: 146 mg/dL — AB (ref 70–99)
Potassium: 4.7 mmol/L (ref 3.5–5.1)
Sodium: 135 mmol/L (ref 135–145)

## 2018-05-06 LAB — TYPE AND SCREEN
ABO/RH(D): A POS
Antibody Screen: NEGATIVE

## 2018-05-06 LAB — SURGICAL PCR SCREEN
MRSA, PCR: NEGATIVE
STAPHYLOCOCCUS AUREUS: NEGATIVE

## 2018-05-06 LAB — CBC
HEMATOCRIT: 37.6 % (ref 36.0–46.0)
Hemoglobin: 12 g/dL (ref 12.0–15.0)
MCH: 30.7 pg (ref 26.0–34.0)
MCHC: 31.9 g/dL (ref 30.0–36.0)
MCV: 96.2 fL (ref 80.0–100.0)
Platelets: 390 10*3/uL (ref 150–400)
RBC: 3.91 MIL/uL (ref 3.87–5.11)
RDW: 13.6 % (ref 11.5–15.5)
WBC: 13.3 10*3/uL — AB (ref 4.0–10.5)
nRBC: 0 % (ref 0.0–0.2)

## 2018-05-06 LAB — APTT: APTT: 32 s (ref 24–36)

## 2018-05-06 LAB — PROTIME-INR
INR: 1.04
PROTHROMBIN TIME: 13.5 s (ref 11.4–15.2)

## 2018-05-06 NOTE — Patient Instructions (Signed)
Your procedure is scheduled on: 05/11/18 Mon Report to Same Day Surgery 2nd floor medical mall [facility] Entrance-take elevator on left to 2nd floor.  Check in with surgery information desk.) To find out your arrival time please call [phone] between 1PM - 3PM on 05/08/18 Fri  Remember: Instructions that are not followed completely may result in serious medical risk, up to and including death, or upon the discretion of your surgeon and anesthesiologist your surgery may need to be rescheduled.    _x___ 1. Do not eat food after midnight the night before your procedure. You may drink clear liquids up to 2 hours before you are scheduled to arrive at the hospital for your procedure.  Do not drink clear liquids within 2 hours of your scheduled arrival to the hospital.  Clear liquids include  --Water or Apple juice without pulp  --Clear carbohydrate beverage such as ClearFast or Gatorade  --Black Coffee or Clear Tea (No milk, no creamers, do not add anything to                  the coffee or Tea Type 1 and type 2 diabetics should only drink water.   ____Ensure clear carbohydrate drink on the way to the hospital for bariatric patients  ____Ensure clear carbohydrate drink 3 hours before surgery for Dr Dwyane Luo patients if physician instructed.   No gum chewing or hard candies.     __x__ 2. No Alcohol for 24 hours before or after surgery.   __x__3. No Smoking or e-cigarettes for 24 prior to surgery.  Do not use any chewable tobacco products for at least 6 hour prior to surgery   ____  4. Bring all medications with you on the day of surgery if instructed.    __x__ 5. Notify your doctor if there is any change in your medical condition     (cold, fever, infections).    x___6. On the morning of surgery brush your teeth with toothpaste and water.  You may rinse your mouth with mouth wash if you wish.  Do not swallow any toothpaste or mouthwash.   Do not wear jewelry, make-up, hairpins,  clips or nail polish.  Do not wear lotions, powders, or perfumes. You may wear deodorant.  Do not shave 48 hours prior to surgery. Men may shave face and neck.  Do not bring valuables to the hospital.    [facility] is not responsible for any belongings or valuables.               Contacts, dentures or bridgework may not be worn into surgery.  Leave your suitcase in the car. After surgery it may be brought to your room.  For patients admitted to the hospital, discharge time is determined by your                       treatment team.  _  Patients discharged the day of surgery will not be allowed to drive home.  You will need someone to drive you home and stay with you the night of your procedure.    Please read over the following fact sheets that you were given:   [facility] Preparing for Surgery and or MRSA Information   _x___ Take anti-hypertensive listed below, cardiac, seizure, asthma,     anti-reflux and psychiatric medicines. These include:  1. metoprolol succinate (TOPROL-XL) 50 MG 24 hr tablet  2.  3.  4.  5.  6.  ____Fleets enema or Magnesium Citrate as directed.   _x___ Use CHG Soap or sage wipes as directed on instruction sheet   ____ Use inhalers on the day of surgery and bring to hospital day of surgery  __x__ Stop Metformin and Janumet 2 days prior to surgery.    __x__ Take 1/2 of usual insulin dose the night before surgery and none on the morning     surgery.   _x___ Follow recommendations from Cardiologist, Pulmonologist or PCP regarding          stopping Aspirin, Coumadin, Plavix ,Eliquis, Effient, or Pradaxa, and Pletal.  X____Stop Anti-inflammatories such as Advil, Aleve, Ibuprofen, Motrin, Naproxen, Naprosyn, Goodies powders or aspirin products. OK to take Tylenol and                          Celebrex.   _x___ Stop supplements until after surgery.  But may continue Vitamin D, Vitamin B,       and multivitamin.   _x___ Bring C-Pap to the hospital.

## 2018-05-06 NOTE — Pre-Procedure Instructions (Signed)
Contacted Dr Manuella Ghazi regarding when to stop Plavix. Waiting on response.

## 2018-05-07 NOTE — Pre-Procedure Instructions (Addendum)
PER PAULA AT DR SHAH'S, PATIENT NOTIFIED BY THEIR OFFICE TO STOP PLAVIX 3 DAYS PREOP CXR FAXED TO DR Lacinda Axon

## 2018-05-11 ENCOUNTER — Encounter: Payer: Self-pay | Admitting: *Deleted

## 2018-05-11 ENCOUNTER — Ambulatory Visit: Payer: PPO | Admitting: Certified Registered Nurse Anesthetist

## 2018-05-11 ENCOUNTER — Ambulatory Visit
Admission: RE | Admit: 2018-05-11 | Discharge: 2018-05-11 | Disposition: A | Discharge status: Home / Self Care | Payer: PPO | Source: Ambulatory Visit | Attending: Neurosurgery | Admitting: Neurosurgery

## 2018-05-11 ENCOUNTER — Ambulatory Visit: Payer: PPO

## 2018-05-11 ENCOUNTER — Encounter
Admission: RE | Disposition: A | Discharge status: Home / Self Care | Payer: Self-pay | Source: Ambulatory Visit | Attending: Neurosurgery

## 2018-05-11 ENCOUNTER — Other Ambulatory Visit: Payer: Self-pay

## 2018-05-11 DIAGNOSIS — E282 Polycystic ovarian syndrome: Secondary | ICD-10-CM | POA: Insufficient documentation

## 2018-05-11 DIAGNOSIS — Z882 Allergy status to sulfonamides status: Secondary | ICD-10-CM | POA: Insufficient documentation

## 2018-05-11 DIAGNOSIS — E785 Hyperlipidemia, unspecified: Secondary | ICD-10-CM | POA: Diagnosis not present

## 2018-05-11 DIAGNOSIS — J449 Chronic obstructive pulmonary disease, unspecified: Secondary | ICD-10-CM | POA: Diagnosis not present

## 2018-05-11 DIAGNOSIS — Z885 Allergy status to narcotic agent status: Secondary | ICD-10-CM | POA: Diagnosis not present

## 2018-05-11 DIAGNOSIS — Z794 Long term (current) use of insulin: Secondary | ICD-10-CM | POA: Diagnosis not present

## 2018-05-11 DIAGNOSIS — Z7902 Long term (current) use of antithrombotics/antiplatelets: Secondary | ICD-10-CM | POA: Insufficient documentation

## 2018-05-11 DIAGNOSIS — I2510 Atherosclerotic heart disease of native coronary artery without angina pectoris: Secondary | ICD-10-CM | POA: Diagnosis not present

## 2018-05-11 DIAGNOSIS — M48061 Spinal stenosis, lumbar region without neurogenic claudication: Secondary | ICD-10-CM | POA: Insufficient documentation

## 2018-05-11 DIAGNOSIS — Z419 Encounter for procedure for purposes other than remedying health state, unspecified: Secondary | ICD-10-CM

## 2018-05-11 DIAGNOSIS — R531 Weakness: Secondary | ICD-10-CM | POA: Insufficient documentation

## 2018-05-11 DIAGNOSIS — E1122 Type 2 diabetes mellitus with diabetic chronic kidney disease: Secondary | ICD-10-CM | POA: Diagnosis not present

## 2018-05-11 DIAGNOSIS — Z79899 Other long term (current) drug therapy: Secondary | ICD-10-CM | POA: Insufficient documentation

## 2018-05-11 DIAGNOSIS — G4730 Sleep apnea, unspecified: Secondary | ICD-10-CM | POA: Diagnosis not present

## 2018-05-11 DIAGNOSIS — I129 Hypertensive chronic kidney disease with stage 1 through stage 4 chronic kidney disease, or unspecified chronic kidney disease: Secondary | ICD-10-CM | POA: Diagnosis not present

## 2018-05-11 DIAGNOSIS — Z8673 Personal history of transient ischemic attack (TIA), and cerebral infarction without residual deficits: Secondary | ICD-10-CM | POA: Insufficient documentation

## 2018-05-11 DIAGNOSIS — N189 Chronic kidney disease, unspecified: Secondary | ICD-10-CM | POA: Insufficient documentation

## 2018-05-11 DIAGNOSIS — M5416 Radiculopathy, lumbar region: Secondary | ICD-10-CM | POA: Diagnosis not present

## 2018-05-11 DIAGNOSIS — Z791 Long term (current) use of non-steroidal anti-inflammatories (NSAID): Secondary | ICD-10-CM | POA: Diagnosis not present

## 2018-05-11 DIAGNOSIS — F17210 Nicotine dependence, cigarettes, uncomplicated: Secondary | ICD-10-CM | POA: Insufficient documentation

## 2018-05-11 DIAGNOSIS — Z981 Arthrodesis status: Secondary | ICD-10-CM | POA: Diagnosis not present

## 2018-05-11 DIAGNOSIS — Z888 Allergy status to other drugs, medicaments and biological substances status: Secondary | ICD-10-CM | POA: Diagnosis not present

## 2018-05-11 DIAGNOSIS — Z7982 Long term (current) use of aspirin: Secondary | ICD-10-CM | POA: Insufficient documentation

## 2018-05-11 DIAGNOSIS — E7800 Pure hypercholesterolemia, unspecified: Secondary | ICD-10-CM | POA: Diagnosis not present

## 2018-05-11 DIAGNOSIS — M1990 Unspecified osteoarthritis, unspecified site: Secondary | ICD-10-CM | POA: Diagnosis not present

## 2018-05-11 DIAGNOSIS — E1140 Type 2 diabetes mellitus with diabetic neuropathy, unspecified: Secondary | ICD-10-CM | POA: Insufficient documentation

## 2018-05-11 HISTORY — PX: LUMBAR LAMINECTOMY/DECOMPRESSION MICRODISCECTOMY: SHX5026

## 2018-05-11 LAB — GLUCOSE, CAPILLARY
GLUCOSE-CAPILLARY: 188 mg/dL — AB (ref 70–99)
Glucose-Capillary: 116 mg/dL — ABNORMAL HIGH (ref 70–99)

## 2018-05-11 LAB — ABO/RH: ABO/RH(D): A POS

## 2018-05-11 SURGERY — LUMBAR LAMINECTOMY/DECOMPRESSION MICRODISCECTOMY 1 LEVEL
Anesthesia: General | Laterality: Left

## 2018-05-11 MED ORDER — ONDANSETRON HCL 4 MG/2ML IJ SOLN
INTRAMUSCULAR | Status: DC | PRN
  Administered 2018-05-11: 4 mg via INTRAVENOUS

## 2018-05-11 MED ORDER — SUCCINYLCHOLINE CHLORIDE 20 MG/ML IJ SOLN
INTRAMUSCULAR | Status: DC | PRN
  Administered 2018-05-11: 100 mg via INTRAVENOUS

## 2018-05-11 MED ORDER — MIDAZOLAM HCL 2 MG/2ML IJ SOLN
INTRAMUSCULAR | Status: AC
  Filled 2018-05-11: qty 2

## 2018-05-11 MED ORDER — FAMOTIDINE 20 MG PO TABS
ORAL_TABLET | ORAL | Status: AC
  Filled 2018-05-11: qty 1

## 2018-05-11 MED ORDER — LIDOCAINE-EPINEPHRINE (PF) 1 %-1:200000 IJ SOLN
INTRAMUSCULAR | Status: DC | PRN
  Administered 2018-05-11: 17 mL via INTRAMUSCULAR

## 2018-05-11 MED ORDER — ACETAMINOPHEN 10 MG/ML IV SOLN
INTRAVENOUS | Status: AC
  Filled 2018-05-11: qty 100

## 2018-05-11 MED ORDER — METHOCARBAMOL 500 MG PO TABS: 500.0000 mg | ORAL_TABLET | Freq: Four times a day (QID) | ORAL | 0 refills | Status: DC | PRN

## 2018-05-11 MED ORDER — LIDOCAINE HCL (CARDIAC) PF 100 MG/5ML IV SOSY
PREFILLED_SYRINGE | INTRAVENOUS | Status: DC | PRN
  Administered 2018-05-11: 90 mg via INTRAVENOUS

## 2018-05-11 MED ORDER — DEXAMETHASONE SODIUM PHOSPHATE 10 MG/ML IJ SOLN
INTRAMUSCULAR | Status: AC
  Filled 2018-05-11: qty 1

## 2018-05-11 MED ORDER — OXYCODONE HCL 5 MG PO TABS: 5.0000 mg | ORAL_TABLET | ORAL | 0 refills | Status: DC | PRN

## 2018-05-11 MED ORDER — MIDAZOLAM HCL 2 MG/2ML IJ SOLN
INTRAMUSCULAR | Status: DC | PRN
  Administered 2018-05-11: 2 mg via INTRAVENOUS

## 2018-05-11 MED ORDER — SUCCINYLCHOLINE CHLORIDE 20 MG/ML IJ SOLN
INTRAMUSCULAR | Status: AC
  Filled 2018-05-11: qty 1

## 2018-05-11 MED ORDER — EPHEDRINE SULFATE 50 MG/ML IJ SOLN
INTRAMUSCULAR | Status: DC | PRN
  Administered 2018-05-11: 5 mg via INTRAVENOUS
  Administered 2018-05-11 (×2): 10 mg via INTRAVENOUS
  Administered 2018-05-11: 5 mg via INTRAVENOUS

## 2018-05-11 MED ORDER — ROCURONIUM BROMIDE 100 MG/10ML IV SOLN
INTRAVENOUS | Status: DC | PRN
  Administered 2018-05-11: 10 mg via INTRAVENOUS

## 2018-05-11 MED ORDER — LIDOCAINE-EPINEPHRINE (PF) 1 %-1:200000 IJ SOLN
INTRAMUSCULAR | Status: AC
  Filled 2018-05-11: qty 30

## 2018-05-11 MED ORDER — METOPROLOL TARTRATE 50 MG PO TABS
ORAL_TABLET | ORAL | Status: AC
  Filled 2018-05-11: qty 1

## 2018-05-11 MED ORDER — ONDANSETRON HCL 4 MG/2ML IJ SOLN
INTRAMUSCULAR | Status: AC
  Filled 2018-05-11: qty 2

## 2018-05-11 MED ORDER — THROMBIN 5000 UNITS EX SOLR
CUTANEOUS | Status: AC
  Filled 2018-05-11: qty 5000

## 2018-05-11 MED ORDER — ONDANSETRON HCL 4 MG/2ML IJ SOLN
4.0000 mg | Freq: Once | INTRAMUSCULAR | Status: AC | PRN
  Administered 2018-05-11: 4 mg via INTRAVENOUS

## 2018-05-11 MED ORDER — METOPROLOL TARTRATE 50 MG PO TABS
50.0000 mg | ORAL_TABLET | Freq: Once | ORAL | Status: AC
  Administered 2018-05-11: 50 mg via ORAL

## 2018-05-11 MED ORDER — FENTANYL CITRATE (PF) 100 MCG/2ML IJ SOLN
INTRAMUSCULAR | Status: DC | PRN
  Administered 2018-05-11: 100 ug via INTRAVENOUS
  Administered 2018-05-11 (×3): 50 ug via INTRAVENOUS

## 2018-05-11 MED ORDER — METHYLPREDNISOLONE ACETATE 40 MG/ML IJ SUSP
INTRAMUSCULAR | Status: DC | PRN
  Administered 2018-05-11: 40 mg

## 2018-05-11 MED ORDER — ACETAMINOPHEN 10 MG/ML IV SOLN
INTRAVENOUS | Status: DC | PRN
  Administered 2018-05-11: 1000 mg via INTRAVENOUS

## 2018-05-11 MED ORDER — ROCURONIUM BROMIDE 50 MG/5ML IV SOLN
INTRAVENOUS | Status: AC
  Filled 2018-05-11: qty 1

## 2018-05-11 MED ORDER — METHYLPREDNISOLONE ACETATE 40 MG/ML IJ SUSP
INTRAMUSCULAR | Status: AC
  Filled 2018-05-11: qty 1

## 2018-05-11 MED ORDER — OXYCODONE HCL 5 MG PO TABS
ORAL_TABLET | ORAL | Status: AC
  Filled 2018-05-11: qty 1

## 2018-05-11 MED ORDER — EPHEDRINE SULFATE 50 MG/ML IJ SOLN
INTRAMUSCULAR | Status: AC
  Filled 2018-05-11: qty 1

## 2018-05-11 MED ORDER — BACITRACIN 50000 UNITS IM SOLR
INTRAMUSCULAR | Status: AC
  Filled 2018-05-11: qty 1

## 2018-05-11 MED ORDER — DEXAMETHASONE SODIUM PHOSPHATE 10 MG/ML IJ SOLN
INTRAMUSCULAR | Status: DC | PRN
  Administered 2018-05-11: 10 mg via INTRAVENOUS

## 2018-05-11 MED ORDER — LIDOCAINE HCL (PF) 2 % IJ SOLN
INTRAMUSCULAR | Status: AC
  Filled 2018-05-11: qty 10

## 2018-05-11 MED ORDER — FAMOTIDINE 20 MG PO TABS
20.0000 mg | ORAL_TABLET | Freq: Once | ORAL | Status: AC
  Administered 2018-05-11: 20 mg via ORAL

## 2018-05-11 MED ORDER — SODIUM CHLORIDE 0.9 % IR SOLN
Status: DC | PRN
  Administered 2018-05-11: 1000 mL

## 2018-05-11 MED ORDER — ONDANSETRON HCL 4 MG/2ML IJ SOLN
INTRAMUSCULAR | Status: AC
  Administered 2018-05-11: 4 mg via INTRAVENOUS
  Filled 2018-05-11: qty 2

## 2018-05-11 MED ORDER — FENTANYL CITRATE (PF) 250 MCG/5ML IJ SOLN
INTRAMUSCULAR | Status: AC
  Filled 2018-05-11: qty 5

## 2018-05-11 MED ORDER — BUPIVACAINE HCL (PF) 0.5 % IJ SOLN
INTRAMUSCULAR | Status: AC
  Filled 2018-05-11: qty 30

## 2018-05-11 MED ORDER — FENTANYL CITRATE (PF) 100 MCG/2ML IJ SOLN: 25.0000 ug | INTRAMUSCULAR | Status: DC | PRN

## 2018-05-11 MED ORDER — PROPOFOL 10 MG/ML IV BOLUS
INTRAVENOUS | Status: AC
  Filled 2018-05-11: qty 20

## 2018-05-11 MED ORDER — CEFAZOLIN SODIUM-DEXTROSE 2-4 GM/100ML-% IV SOLN
2.0000 g | Freq: Once | INTRAVENOUS | Status: AC
  Administered 2018-05-11: 2 g via INTRAVENOUS

## 2018-05-11 MED ORDER — SODIUM CHLORIDE 0.9 % IV SOLN
INTRAVENOUS | Status: DC
  Administered 2018-05-11 (×2): via INTRAVENOUS

## 2018-05-11 MED ORDER — THROMBIN 5000 UNITS EX SOLR
CUTANEOUS | Status: DC | PRN
  Administered 2018-05-11: 5000 [IU] via TOPICAL

## 2018-05-11 MED ORDER — PROPOFOL 10 MG/ML IV BOLUS
INTRAVENOUS | Status: DC | PRN
  Administered 2018-05-11: 160 mg via INTRAVENOUS

## 2018-05-11 MED ORDER — CEFAZOLIN SODIUM-DEXTROSE 2-4 GM/100ML-% IV SOLN
INTRAVENOUS | Status: AC
  Filled 2018-05-11: qty 100

## 2018-05-11 MED ORDER — OXYCODONE HCL 5 MG PO TABS
5.0000 mg | ORAL_TABLET | ORAL | Status: DC | PRN
  Administered 2018-05-11: 5 mg via ORAL
  Filled 2018-05-11: qty 1

## 2018-05-11 MED ORDER — LIDOCAINE HCL 4 % MT SOLN
OROMUCOSAL | Status: DC | PRN
  Administered 2018-05-11: 4 mL via TOPICAL

## 2018-05-11 SURGICAL SUPPLY — 61 items
ADH SKN CLS APL DERMABOND .7 (GAUZE/BANDAGES/DRESSINGS) ×1
AGENT HMST MTR 8 SURGIFLO (HEMOSTASIS) ×1
APL SRG 60D 8 XTD TIP BNDBL (TIP)
BUR NEURO DRILL SOFT 3.0X3.8M (BURR) ×2 IMPLANT
CANISTER SUCT 1200ML W/VALVE (MISCELLANEOUS) ×4 IMPLANT
CHLORAPREP W/TINT 26ML (MISCELLANEOUS) ×4 IMPLANT
COUNTER NEEDLE 20/40 LG (NEEDLE) ×2 IMPLANT
COVER LIGHT HANDLE STERIS (MISCELLANEOUS) ×5 IMPLANT
COVER WAND RF STERILE (DRAPES) ×1 IMPLANT
CUP MEDICINE 2OZ PLAST GRAD ST (MISCELLANEOUS) ×2 IMPLANT
DERMABOND ADVANCED (GAUZE/BANDAGES/DRESSINGS) ×1
DERMABOND ADVANCED .7 DNX12 (GAUZE/BANDAGES/DRESSINGS) ×1 IMPLANT
DRAPE C-ARM 42X72 X-RAY (DRAPES) ×4 IMPLANT
DRAPE LAPAROTOMY 100X77 ABD (DRAPES) ×2 IMPLANT
DRAPE MICROSCOPE SPINE 48X150 (DRAPES) ×1 IMPLANT
DRAPE SURG 17X11 SM STRL (DRAPES) ×2 IMPLANT
DRSG OPSITE POSTOP 4X6 (GAUZE/BANDAGES/DRESSINGS) ×1 IMPLANT
DURASEAL APPLICATOR TIP (TIP) IMPLANT
DURASEAL SPINE SEALANT 3ML (MISCELLANEOUS) IMPLANT
ELECT CAUTERY BLADE TIP 2.5 (TIP) ×2
ELECT EZSTD 165MM 6.5IN (MISCELLANEOUS) ×2
ELECT REM PT RETURN 9FT ADLT (ELECTROSURGICAL) ×2
ELECTRODE CAUTERY BLDE TIP 2.5 (TIP) ×1 IMPLANT
ELECTRODE EZSTD 165MM 6.5IN (MISCELLANEOUS) ×1 IMPLANT
ELECTRODE REM PT RTRN 9FT ADLT (ELECTROSURGICAL) ×1 IMPLANT
GAUZE SPONGE 4X4 12PLY STRL (GAUZE/BANDAGES/DRESSINGS) ×2 IMPLANT
GLOVE BIOGEL PI IND STRL 7.0 (GLOVE) ×1 IMPLANT
GLOVE BIOGEL PI IND STRL 8 (GLOVE) ×1 IMPLANT
GLOVE BIOGEL PI INDICATOR 7.0 (GLOVE) ×2
GLOVE BIOGEL PI INDICATOR 8 (GLOVE) ×2
GLOVE SURG SYN 6.5 ES PF (GLOVE) ×4 IMPLANT
GLOVE SURG SYN 6.5 PF PI (GLOVE) ×2 IMPLANT
GLOVE SURG SYN 8.0 (GLOVE) ×6 IMPLANT
GLOVE SURG SYN 8.0 PF PI (GLOVE) ×3 IMPLANT
GOWN STRL REUS W/ TWL XL LVL3 (GOWN DISPOSABLE) ×1 IMPLANT
GOWN STRL REUS W/TWL MED LVL3 (GOWN DISPOSABLE) ×2 IMPLANT
GOWN STRL REUS W/TWL XL LVL3 (GOWN DISPOSABLE) ×2
GRADUATE 1200CC STRL 31836 (MISCELLANEOUS) ×2 IMPLANT
KIT TURNOVER KIT A (KITS) ×2 IMPLANT
KIT WILSON FRAME (KITS) ×2 IMPLANT
KNIFE BAYONET SHORT DISCETOMY (MISCELLANEOUS) IMPLANT
MARKER SKIN DUAL TIP RULER LAB (MISCELLANEOUS) ×4 IMPLANT
NDL SAFETY ECLIPSE 18X1.5 (NEEDLE) ×1 IMPLANT
NEEDLE HYPO 18GX1.5 SHARP (NEEDLE) ×6
NEEDLE HYPO 22GX1.5 SAFETY (NEEDLE) ×2 IMPLANT
NS IRRIG 1000ML POUR BTL (IV SOLUTION) ×2 IMPLANT
PACK LAMINECTOMY NEURO (CUSTOM PROCEDURE TRAY) ×2 IMPLANT
PAD ARMBOARD 7.5X6 YLW CONV (MISCELLANEOUS) ×2 IMPLANT
SPOGE SURGIFLO 8M (HEMOSTASIS) ×1
SPONGE SURGIFLO 8M (HEMOSTASIS) ×1 IMPLANT
STAPLER SKIN PROX 35W (STAPLE) ×1 IMPLANT
SUT DVC VLOC 3-0 CL 6 P-12 (SUTURE) ×1 IMPLANT
SUT NURALON 4 0 TR CR/8 (SUTURE) IMPLANT
SUT POLYSORB 2-0 5X18 GS-10 (SUTURE) ×2 IMPLANT
SUT VIC AB 0 CT1 18XCR BRD 8 (SUTURE) ×1 IMPLANT
SUT VIC AB 0 CT1 8-18 (SUTURE) ×2
SYR 10ML LL (SYRINGE) ×4 IMPLANT
SYR 30ML LL (SYRINGE) ×2 IMPLANT
SYR 3ML LL SCALE MARK (SYRINGE) ×2 IMPLANT
TOWEL OR 17X26 4PK STRL BLUE (TOWEL DISPOSABLE) ×8 IMPLANT
TUBING CONNECTING 10 (TUBING) ×2 IMPLANT

## 2018-05-11 NOTE — Discharge Summary (Addendum)
Procedure: L4/5 lumbar microdiscectomy Procedure date: 05/11/2018 Diagnosis: Lumbar radiculopahy  History: Mckenzie Lawrence is s/p L4-5 lumbar decompression for lumbar radiculopathy. Tolerated procedure well. Evaluated while still disoriented from anesthesia but able to answer questions and obey commands. Denies any pain at this time, including back and lower extremities. Baseline numbness in feet from diabetic neuropathy. No new numbness or tingling.  Physical Exam: Vitals:   05/11/18 0918  BP: (!) 165/67  Pulse: 81  Resp: 16  Temp: (!) 96.9 F (36.1 C)  SpO2: 100%    AA Ox3 Skin: glue intact Strength:5/5 throughout lower extremities.  Sensation: intact and symmetric lower extremities.   Data:  Recent Labs  Lab 05/06/18 1436  NA 135  K 4.7  CL 100  CO2 23  BUN 18  CREATININE 1.41*  GLUCOSE 146*  CALCIUM 9.2   No results for input(s): AST, ALT, ALKPHOS in the last 168 hours.  Invalid input(s): TBILI   Recent Labs  Lab 05/06/18 1436  WBC 13.3*  HGB 12.0  HCT 37.6  PLT 390   Recent Labs  Lab 05/06/18 1436  APTT 32  INR 1.04         Other tests/results: No imaging reviewed.   Assessment/Plan:  Mckenzie Lawrence is POD0 s/p lumbar decompression. Symptoms prior to surgery have resolved at this time. Will continue pain control with tylenol, robaxin, and oxycodone as needed. Advised to resume aspirin and plavix 7 days following procedure. She is scheduled to follow up in clinic in 2 weeks to monitor progress.   Marin Olp PA-C Department of Neurosurgery

## 2018-05-11 NOTE — Anesthesia Postprocedure Evaluation (Signed)
Anesthesia Post Note  Patient: Mckenzie Lawrence  Procedure(s) Performed: LUMBAR LAMINECTOMY/DECOMPRESSION MICRODISCECTOMY 1 LEVEL- L4-5 (Left )  Patient location during evaluation: PACU Anesthesia Type: General Level of consciousness: awake and alert Pain management: pain level controlled Vital Signs Assessment: post-procedure vital signs reviewed and stable Respiratory status: spontaneous breathing, nonlabored ventilation, respiratory function stable and patient connected to nasal cannula oxygen Cardiovascular status: blood pressure returned to baseline and stable Postop Assessment: no apparent nausea or vomiting Anesthetic complications: no     Last Vitals:  Vitals:   05/11/18 1429 05/11/18 1510  BP: (!) 151/60 (!) 153/53  Pulse: 78   Resp: 18   Temp: (!) 36.1 C   SpO2: 98% 98%    Last Pain:  Vitals:   05/11/18 1510  TempSrc:   PainSc: 3                  Martha Clan

## 2018-05-11 NOTE — Anesthesia Procedure Notes (Signed)
Procedure Name: Intubation Date/Time: 05/11/2018 10:47 AM Performed by: Eben Burow, CRNA Pre-anesthesia Checklist: Patient identified, Emergency Drugs available, Suction available, Patient being monitored and Timeout performed Patient Re-evaluated:Patient Re-evaluated prior to induction Oxygen Delivery Method: Circle system utilized Preoxygenation: Pre-oxygenation with 100% oxygen Induction Type: IV induction Ventilation: Mask ventilation without difficulty and Oral airway inserted - appropriate to patient size Laryngoscope Size: Sabra Heck and 2 Grade View: Grade I Tube type: Oral Tube size: 7.0 mm Number of attempts: 1 Airway Equipment and Method: Stylet and LTA kit utilized Placement Confirmation: ETT inserted through vocal cords under direct vision,  positive ETCO2 and breath sounds checked- equal and bilateral Secured at: 20 cm Tube secured with: Tape Dental Injury: Teeth and Oropharynx as per pre-operative assessment

## 2018-05-11 NOTE — Consult Note (Signed)
Pharmacy Antibiotic Note  Mckenzie Lawrence is a 72 y.o. female admitted on 05/11/2018 with surgical prophylaxis.  Pharmacy has been consulted for Cefazolin dosing.  Plan: Cefazolin 2 g once for surgical prophylaxis (weight < 120kg)     No data recorded.  Recent Labs  Lab 05/06/18 1436  WBC 13.3*  CREATININE 1.41*    Estimated Creatinine Clearance: 44.1 mL/min (A) (by C-G formula based on SCr of 1.41 mg/dL (H)).    Allergies  Allergen Reactions  . Iodine Anaphylaxis and Other (See Comments)    Pt states that she is allergic to ingested iodine only, okay for betadine.    . Shellfish Allergy Anaphylaxis  . Codeine Nausea And Vomiting  . Dorzolamide Other (See Comments)    Unknown  . Irbesartan Other (See Comments)    Reaction:  Unknown  (Avapro)  . Morphine Sulfate Nausea And Vomiting  . Sulfa Antibiotics Other (See Comments)    Reaction:  Fever        Thank you for allowing pharmacy to be a part of this patient's care.  Lu Duffel, PharmD Clinical Pharmacist 05/11/2018 8:44 AM

## 2018-05-11 NOTE — Anesthesia Post-op Follow-up Note (Signed)
Anesthesia QCDR form completed.        

## 2018-05-11 NOTE — Discharge Instructions (Addendum)
Your surgeon has performed an operation on your lumbar spine (low back) to relieve pressure on one or more nerves. Many times, patients feel better immediately after surgery and can overdo it. Even if you feel well, it is important that you follow these activity guidelines. If you do not let your back heal properly from the surgery, you can increase the chance of a disc herniation and/or return of your symptoms. The following are instructions to help in your recovery once you have been discharged from the hospital.  * Do not take anti-inflammatory medications for 3 days after surgery (naproxen [Aleve], ibuprofen [Advil, Motrin], celecoxib [Celebrex], etc.) ** You may resume aspirin and plavix 7 days after procedure.   Activity    No bending, lifting, or twisting (BLT). Avoid lifting objects heavier than 10 pounds (gallon milk jug).  Where possible, avoid household activities that involve lifting, bending, pushing, or pulling such as laundry, vacuuming, grocery shopping, and childcare. Try to arrange for help from friends and family for these activities while your back heals.  Increase physical activity slowly as tolerated.  Taking short walks is encouraged, but avoid strenuous exercise. Do not jog, run, bicycle, lift weights, or participate in any other exercises unless specifically allowed by your doctor. Avoid prolonged sitting, including car rides.  Talk to your doctor before resuming sexual activity.  You should not drive until cleared by your doctor.  Until released by your doctor, you should not return to work or school.  You should rest at home and let your body heal.   You may shower two days after your surgery.  After showering, lightly dab your incision dry. Do not take a tub bath or go swimming for 3 weeks, or until approved by your doctor at your follow-up appointment.  If you smoke, we strongly recommend that you quit.  Smoking has been proven to interfere with normal healing in  your back and will dramatically reduce the success rate of your surgery. Please contact [facility] (800-QUIT-NOW) and use the resources at www.[facility].com for assistance in stopping smoking.  Surgical Incision   If you have a dressing on your incision, you may remove it three days after your surgery. Keep your incision area clean and dry.  If you have staples or stitches on your incision, you should have a follow up scheduled for removal. If you do not have staples or stitches, you will have steri-strips (small pieces of surgical tape) or Dermabond glue. The steri-strips/glue should begin to peel away within about a week (it is fine if the steri-strips fall off before then). If the strips are still in place one week after your surgery, you may gently remove them.  Diet            You may return to your usual diet. Be sure to stay hydrated.  When to Contact us  Although your surgery and recovery will likely be uneventful, you may have some residual numbness, aches, and pains in your back and/or legs. This is normal and should improve in the next few weeks.  However, should you experience any of the following, contact us immediately:  New numbness or weakness  Pain that is progressively getting worse, and is not relieved by your pain medications or rest  Bleeding, redness, swelling, pain, or drainage from surgical incision  Chills or flu-like symptoms  Fever greater than 101.0 F (38.3 C)  Problems with bowel or bladder functions  Difficulty breathing or shortness of breath  Warmth, tenderness, or  swelling in your calf  Contact Information  During office hours (Monday-Friday 9 am to 5 pm), please call your physician at [phone]  After hours and weekends, please call the [place] Operator at [phone] and ask for the Neurosurgery Resident On Call   For a life-threatening emergency, call [place]   1) The drugs that you were given  will stay in your system until tomorrow so for the next 24 hours you should not:  A) Drive an automobile B) Make any legal decisions C) Drink any alcoholic beverage   2) You may resume regular meals tomorrow.  Today it is better to start with liquids and gradually work up to solid foods.  You may eat anything you prefer, but it is better to start with liquids, then soup and crackers, and gradually work up to solid foods.   3) Please notify your doctor immediately if you have any unusual bleeding, trouble breathing, redness and pain at the surgery site, drainage, fever, or pain not relieved by medication.    4) Additional Instructions:        Please contact your physician with any problems or Same Day Surgery at [phone], Monday through Friday 6 am to 4 pm, or  at [facility] number at [phone].

## 2018-05-11 NOTE — Interval H&P Note (Signed)
History and Physical Interval Note:  05/11/2018 9:46 AM  Mckenzie Lawrence  has presented today for surgery, with the diagnosis of lumbar radiculopathy  The various methods of treatment have been discussed with the patient and family. After consideration of risks, benefits and other options for treatment, the patient has consented to  Procedure(s): LUMBAR LAMINECTOMY/DECOMPRESSION MICRODISCECTOMY 1 LEVEL- L4-5 (Left) as a surgical intervention .  The patient's history has been reviewed, patient examined, no change in status, stable for surgery.  I have reviewed the patient's chart and labs.  Questions were answered to the patient's satisfaction.     [name]

## 2018-05-11 NOTE — Transfer of Care (Signed)
Immediate Anesthesia Transfer of Care Note  Patient: Mckenzie Lawrence  Procedure(s) Performed: LUMBAR LAMINECTOMY/DECOMPRESSION MICRODISCECTOMY 1 LEVEL- L4-5 (Left )  Patient Location: PACU  Anesthesia Type:General  Level of Consciousness: drowsy  Airway & Oxygen Therapy: Patient Spontanous Breathing and Patient connected to face mask oxygen  Post-op Assessment: Report given to RN and Post -op Vital signs reviewed and stable  Post vital signs: Reviewed and stable  Last Vitals:  Vitals Value Taken Time  BP 148/60 05/11/2018  1:34 PM  Temp    Pulse 80 05/11/2018  1:36 PM  Resp 14 05/11/2018  1:36 PM  SpO2 100 % 05/11/2018  1:36 PM  Vitals shown include unvalidated device data.  Last Pain:  Vitals:   05/11/18 1334  TempSrc:   PainSc: (P) Asleep         Complications: No apparent anesthesia complications

## 2018-05-11 NOTE — H&P (Signed)
Mckenzie Lawrence is an 72 y.o. female.   Chief Complaint: Left leg pain HPI: Mckenzie Lawrence is here with left calf pain that is been going on for approximately 12 weeks. She does not remember an inciting event. She denies any previous pain such as this. She denies any right leg pain. The pain will travel down to the buttocks or on the left into the lateral calf. She does not elicit any pain in the foot however she does have chronic numbness there. She has not noticed any weakness since this started. She was given a steroid taper which helped slightly. She recently underwent a steroid injection which did help for some time. He has been on tramadol and a muscle relaxant and Tylenol daily. She had a steroid injection which gave minimal relief. Given this, she elected to proceed with surgical decompression.   General ultrasound recently to rule out a DVT in the left leg and this was negative.  She continues to smoke about a pack per day. Does report a history of infection with prior surgery. She is on aspirin and Plavix for prior stroke.    Past Medical History:  Diagnosis Date  . Arthritis   . Bladder incontinence   . Broken foot   . Cataracts, bilateral   . Chronic kidney insufficiency   . COPD (chronic obstructive pulmonary disease) (HCC)    wheezing  . CVA (cerebral vascular accident) ([place]) 2016  . Diabetes mellitus    insulin dependent, Type 2  . HLD (hyperlipidemia)   . Hx of cardiovascular stress test    a. ETT (6/13):  Ex 5:13; no ischemic changes  . Hypertension    controlled on meds  . Lacunar stroke of left subthalamic region [facility]) 02/2015  . Leg pain    left  . Lower back pain   . Neuromuscular disorder ([place])    stroke right hand tingling  . Orthostatic hypotension   . Osteopenia 01/2017   T score -2.0 stable from prior DEXA  . PCOS (polycystic ovarian syndrome)   . Personal history of tobacco use, presenting hazards to health 01/09/2015  . Sleep apnea    borderline/ CPAP     Past Surgical History:  Procedure Laterality Date  . BROW LIFT Bilateral 11/04/2017   Procedure: BLEPHAROPLASTY UPPER EYELID W/EXCESS SKIN;  Surgeon: Karle Starch, MD;  Location: [facility];  Service: Ophthalmology;  Laterality: Bilateral;  DIABETES-insulin dependent uses CPAP  . CARDIAC CATHETERIZATION  20 yrs ago   found nothing  . CARPAL TUNNEL RELEASE Bilateral   . CATARACT EXTRACTION Bilateral   . ELBOW SURGERY Bilateral   . FOOT SURGERY    . Groin Abscess    . HAND SURGERY    . KNEE SURGERY Bilateral   . LABIAL ABSCESS    . OOPHORECTOMY     BSO  . PUBO VAG SLING    . SHOULDER SURGERY     bilateral arthroscopies  . VAGINAL HYSTERECTOMY  1979    Family History  Problem Relation Age of Onset  . Hypertension Mother   . Heart disease Mother 40       MI  . Diabetes Father   . Diabetes Sister   . Hypertension Sister   . Diabetes Brother   . Hypertension Brother   . Heart disease Brother 39       CAD  . Cancer Sister        Multiple myloma  . Diabetes Brother    Social History:  reports that she has been smoking cigarettes. She has a 75.00 pack-year smoking history. She has never used smokeless tobacco. She reports that she does not drink alcohol or use drugs.  Allergies:  Allergies  Allergen Reactions  . Iodine Anaphylaxis and Other (See Comments)    Pt states that she is allergic to ingested iodine only, okay for betadine.    . Shellfish Allergy Anaphylaxis  . Codeine Nausea And Vomiting  . Dorzolamide Other (See Comments)    Unknown  . Irbesartan Other (See Comments)    Reaction:  Unknown  (Avapro)  . Morphine Sulfate Nausea And Vomiting  . Sulfa Antibiotics Other (See Comments)    Reaction:  Fever     Medications Prior to Admission  Medication Sig Dispense Refill  . acetaminophen (TYLENOL) 500 MG tablet Take 1,000 mg by mouth every 6 (six) hours as needed for moderate pain or headache.    Marland Kitchen aspirin 81 MG chewable tablet Chew 81 mg by mouth  daily.     . clopidogrel (PLAVIX) 75 MG tablet Take 75 mg by mouth at bedtime.     Marland Kitchen ibuprofen (ADVIL,MOTRIN) 200 MG tablet Take 400 mg by mouth every 6 (six) hours as needed for headache or moderate pain.    Marland Kitchen imipramine (TOFRANIL) 50 MG tablet Take 25 mg by mouth every other day.    . insulin detemir (LEVEMIR) 100 UNIT/ML injection Inject 60 Units into the skin daily.     Marland Kitchen losartan (COZAAR) 100 MG tablet Take 1 tablet (100 mg total) by mouth daily. 30 tablet 2  . metFORMIN (GLUCOPHAGE) 1000 MG tablet Take 1,000 mg by mouth 2 (two) times daily with a meal.    . metoprolol succinate (TOPROL-XL) 50 MG 24 hr tablet Take 50 mg by mouth daily.     Marland Kitchen oxybutynin (DITROPAN) 5 MG tablet Take 5 mg by mouth daily.     . pravastatin (PRAVACHOL) 40 MG tablet Take 40 mg by mouth daily.     . Multiple Vitamins-Minerals (VISION VITAMINS PO) Take 1 capsule by mouth 2 (two) times daily. AREDS    . ondansetron (ZOFRAN ODT) 8 MG disintegrating tablet Take 1 tablet (8 mg total) by mouth every 8 (eight) hours as needed. (Patient not taking: Reported on 05/05/2018) 6 tablet 0    Results for orders placed or performed during the hospital encounter of 05/11/18 (from the past 48 hour(s))  Glucose, capillary     Status: Abnormal   Collection Time: 05/11/18  8:52 AM  Result Value Ref Range   Glucose-Capillary 116 (H) 70 - 99 mg/dL   No results found.  ROS General ROS: Negative Psychological ROS: Negative Ophthalmic ROS: Negative ENT ROS: Negative Hematological and Lymphatic ROS: Negative  Endocrine ROS: Negative Respiratory ROS: Negative Cardiovascular ROS: Negative Gastrointestinal ROS: Negative Genito-Urinary ROS: Negative Musculoskeletal ROS: Negative for back pain Neurological ROS: Positive for leg pain, chronic foot numbness Dermatological ROS: Negative  Blood pressure (!) 165/67, pulse 81, temperature (!) 96.9 F (36.1 C), temperature source Temporal, resp. rate 16, height 5\' 8"  (1.727 m), weight  92.1 kg, SpO2 100 %. Physical Exam  General appearance: Alert, cooperative, in no acute distress Head: Normocephalic, atraumatic Eyes: Normal, EOM intact Oropharynx: Moist without lesions CV: Regular rate and rhythm Pulm: Clear to auscultation Back: No tenderness to palpation of the midline or paramedian regions Ext: No edema in LE bilaterally, warm extremities  Neurologic exam:  Mental status: alertness: alert, affect: normal Speech: fluent and clear Motor:strength symmetric 5/5,  normal muscle mass and tone in bilateral hip flexion, knee flexion, knee extension, dorsiflexion, plantar flexion Sensory: Decreased light touch in bilateral mid calves to feet. Reflexes: 1+ at bilateral patella and ankle Gait: Antalgic gait  Imaging: MRI lumbar spine: There is a normal lordotic curvature. There is a mild grade 1 listhesis of L4 on L5. Remainder of alignment is well-maintained. Space heights remain well-preserved. There is a mild broad-based disc bulge at L5-S1 resulting in very mild stenosis. There is a focal disc herniation on the left at L4-5 resulting in severe lateral recess and foraminal stenosis. There are no other areas of significant stenosis noted.   Assessment/Plan Plan for left L4/5 HLD for left leg radiculopathy   [name], MD 05/11/2018, 9:42 AM

## 2018-05-11 NOTE — Op Note (Signed)
Operative Note  SURGERY DATE:05/11/2018  PRE-OP DIAGNOSIS: Lumbar Stenosis withLumbar Radiculopathy(m48.062)  POST-OP DIAGNOSIS:Post-Op Diagnosis Codes: Lumbar Stenosis withLumbar Radiculopathy(m48.062)  Procedure(s) with comments: Left L4/5Hemilaminectomy with Facetectomy and Foraminotomy  SURGEON:  * Malen Gauze, MD Marin Olp, PA Assistant  ANESTHESIA:General  OPERATIVE FINDINGS: Lateral recess stenosis at left L4/5 with migrated disc to L5 pedicle  OPERATIVE REPORT:   Indication: Mckenzie Lawrence presented to clinic on10/8 with ongoing left leg pain. She eventually failed conservative management including PT, steroid injections, and prescription medications. MRI revealed left L4/5 stenosis compressing the traversing nerve root with a small listhesis.Dynamic x-rays showed noabnormalmovement and hemilaminectomyanddiscectomywas discussed to relieve symptoms. Therisks of surgery were explained to include hematoma, infection, damage to nerve roots, CSF leak, weakness, numbness, pain, need for future surgery including fusion, heart attack, and stroke. She elected to proceed with surgery for symptom relief.   Procedure The patient was brought to the OR after informed consent was obtained. She was given general anesthesia and intubated by the anesthesia service. Vascular access lines were placed.The patient was then placed prone on a Wilson frameensuring all pressure points were padded.Antibiotics were administered.A time-out was performed per protocol.   The patient was sterilely prepped and draped. Fluoroscopy confirmedL4/5 interspaceand a midline incision was planned.The incision was instilled withlocal anesthetic with epinephrine. The skin was opened sharply and the dissection taken to the fascia. This was incised and cautery was used to dissect the subperiosteal plane to expose the spinous processes and lamina of L4 and L5on the  left out to the medial edge of the facet. Retractors were inserted and hemostasis was achieved. X-ray was used to confirm location. Microscope was brought into the field.  Next, a matchstickdrill bit was used to remove the L4lamina centrally. The underlying ligament was freed and removed with combination of rongeurs. The decompression was taken caudal to the superior border ofL5where the rostral lamina was removed and then the medial facet was removed. A medial facetectomy was performed with foraminotomy at the L4/5level. Once all ligament and soft tissue was removed, attention was turned to inspection of the nerve root. There was obvious disc material and scar noted along the nerve root laterally at the level of the L5 pedicle. This was removed with curettes and rongeurs. A blunt instrument was passed along the nerve root and no further compression seen. The nerveroot was felt to be free. The epidural space was also inspected and found to have no compression.  Hemostasis was obtained with Floseal and cautery.Depomedrol was placed along nerve root.  Thefasciawas then closed using 0 vicryl followed by thesubcutaneous and dermal layers with 2-0 vicryluntil the epidermis was well approximated.  Marcaine was injected into fascia. A subcuticular stitch was placed.The skin was closed with Dermabond. A dressing was applied.  The patient was returned to supine position and extubated by the anesthesia service. The patient was then taken to the PACU for post-operative care where she was moving extremities symmetrically.   ESTIMATED BLOOD LOSS: 50cc  SPECIMENS None  IMPLANT None   I performed the case in its entiretywith assistance of PA, Mckenzie Lawrence, [place]

## 2018-05-11 NOTE — Anesthesia Preprocedure Evaluation (Signed)
Anesthesia Evaluation  Patient identified by MRN, date of birth, ID band Patient awake    Reviewed: Allergy & Precautions, NPO status , Patient's Chart, lab work & pertinent test results  History of Anesthesia Complications Negative for: history of anesthetic complications  Airway Mallampati: III  TM Distance: >3 FB     Dental  (+) Caps, Dental Advidsory Given   Pulmonary shortness of breath and with exertion, sleep apnea (heavy smoker - 1.5 packs per day, smoked today) , COPD, neg recent URI, Current Smoker,    breath sounds clear to auscultation       Cardiovascular Exercise Tolerance: Good hypertension, (-) angina(-) CAD, (-) Past MI, (-) Cardiac Stents and (-) CABG (-) dysrhythmias (-) Valvular Problems/Murmurs Rhythm:Regular Rate:Normal  Hyperlipidemia     Neuro/Psych neg Seizures CVA ( Lacunar stroke of left subthalamic region 2016), Residual Symptoms    GI/Hepatic Neg liver ROS, neg GERD  ,  Endo/Other  diabetes, Type 2PCOS Obesity - BMI 31  Renal/GU Renal disease     Musculoskeletal   Abdominal   Peds  Hematology   Anesthesia Other Findings Past Medical History: No date: Arthritis No date: Bladder incontinence No date: Broken foot No date: Cataracts, bilateral No date: Chronic kidney insufficiency No date: COPD (chronic obstructive pulmonary disease) (HCC)     Comment:  wheezing 2016: CVA (cerebral vascular accident) ([place]) No date: Diabetes mellitus     Comment:  insulin dependent, Type 2 No date: HLD (hyperlipidemia) No date: Hx of cardiovascular stress test     Comment:  a. ETT (6/13):  Ex 5:13; no ischemic changes No date: Hypertension     Comment:  controlled on meds 02/2015: Lacunar stroke of left subthalamic region [facility]) No date: Leg pain     Comment:  left No date: Lower back pain No date: Neuromuscular disorder ([place])     Comment:  stroke right hand tingling No date: Orthostatic  hypotension 01/2017: Osteopenia     Comment:  T score -2.0 stable from prior DEXA No date: PCOS (polycystic ovarian syndrome) 01/09/2015: Personal history of tobacco use, presenting hazards to  health No date: Sleep apnea     Comment:  borderline/ CPAP   Reproductive/Obstetrics                             Anesthesia Physical  Anesthesia Plan  ASA: III  Anesthesia Plan: General   Post-op Pain Management:    Induction: Intravenous  PONV Risk Score and Plan: Midazolam, Ondansetron, Dexamethasone and Treatment may vary due to age or medical condition  Airway Management Planned: Oral ETT  Additional Equipment:   Intra-op Plan:   Post-operative Plan: Extubation in OR  Informed Consent: I have reviewed the patients History and Physical, chart, labs and discussed the procedure including the risks, benefits and alternatives for the proposed anesthesia with the patient or authorized representative who has indicated his/her understanding and acceptance.   Dental Advisory Given  Plan Discussed with: CRNA  Anesthesia Plan Comments:         Anesthesia Quick Evaluation

## 2018-05-12 ENCOUNTER — Encounter: Payer: Self-pay | Admitting: Neurosurgery

## 2018-05-21 DIAGNOSIS — M5416 Radiculopathy, lumbar region: Secondary | ICD-10-CM | POA: Diagnosis not present

## 2018-05-21 DIAGNOSIS — F5104 Psychophysiologic insomnia: Secondary | ICD-10-CM | POA: Diagnosis not present

## 2018-05-21 DIAGNOSIS — G4733 Obstructive sleep apnea (adult) (pediatric): Secondary | ICD-10-CM | POA: Diagnosis not present

## 2018-05-22 ENCOUNTER — Encounter (HOSPITAL_COMMUNITY): Payer: PPO

## 2018-05-22 ENCOUNTER — Ambulatory Visit: Payer: PPO | Admitting: Vascular Surgery

## 2018-05-27 ENCOUNTER — Ambulatory Visit: Payer: PPO | Admitting: Vascular Surgery

## 2018-05-27 ENCOUNTER — Encounter (HOSPITAL_COMMUNITY): Payer: PPO

## 2018-05-27 DIAGNOSIS — E119 Type 2 diabetes mellitus without complications: Secondary | ICD-10-CM | POA: Diagnosis not present

## 2018-05-27 DIAGNOSIS — I10 Essential (primary) hypertension: Secondary | ICD-10-CM | POA: Diagnosis not present

## 2018-06-01 DIAGNOSIS — D649 Anemia, unspecified: Secondary | ICD-10-CM | POA: Diagnosis not present

## 2018-06-01 DIAGNOSIS — Z794 Long term (current) use of insulin: Secondary | ICD-10-CM | POA: Diagnosis not present

## 2018-06-01 DIAGNOSIS — Z0000 Encounter for general adult medical examination without abnormal findings: Secondary | ICD-10-CM | POA: Diagnosis not present

## 2018-06-01 DIAGNOSIS — N183 Chronic kidney disease, stage 3 (moderate): Secondary | ICD-10-CM | POA: Diagnosis not present

## 2018-06-01 DIAGNOSIS — M5416 Radiculopathy, lumbar region: Secondary | ICD-10-CM | POA: Diagnosis not present

## 2018-06-01 DIAGNOSIS — E1122 Type 2 diabetes mellitus with diabetic chronic kidney disease: Secondary | ICD-10-CM | POA: Diagnosis not present

## 2018-06-01 DIAGNOSIS — E782 Mixed hyperlipidemia: Secondary | ICD-10-CM | POA: Diagnosis not present

## 2018-06-01 DIAGNOSIS — J449 Chronic obstructive pulmonary disease, unspecified: Secondary | ICD-10-CM | POA: Diagnosis not present

## 2018-06-01 DIAGNOSIS — N3941 Urge incontinence: Secondary | ICD-10-CM | POA: Diagnosis not present

## 2018-06-01 DIAGNOSIS — I10 Essential (primary) hypertension: Secondary | ICD-10-CM | POA: Diagnosis not present

## 2018-06-01 DIAGNOSIS — Z720 Tobacco use: Secondary | ICD-10-CM | POA: Diagnosis not present

## 2018-06-01 DIAGNOSIS — Z8673 Personal history of transient ischemic attack (TIA), and cerebral infarction without residual deficits: Secondary | ICD-10-CM | POA: Diagnosis not present

## 2018-06-23 DIAGNOSIS — E785 Hyperlipidemia, unspecified: Secondary | ICD-10-CM | POA: Diagnosis not present

## 2018-06-23 DIAGNOSIS — Z720 Tobacco use: Secondary | ICD-10-CM | POA: Diagnosis not present

## 2018-06-23 DIAGNOSIS — G4733 Obstructive sleep apnea (adult) (pediatric): Secondary | ICD-10-CM | POA: Diagnosis not present

## 2018-06-23 DIAGNOSIS — I10 Essential (primary) hypertension: Secondary | ICD-10-CM | POA: Diagnosis not present

## 2018-06-23 DIAGNOSIS — J449 Chronic obstructive pulmonary disease, unspecified: Secondary | ICD-10-CM | POA: Diagnosis not present

## 2018-07-06 DIAGNOSIS — D649 Anemia, unspecified: Secondary | ICD-10-CM | POA: Diagnosis not present

## 2018-07-07 ENCOUNTER — Encounter: Payer: Self-pay | Admitting: Physical Therapy

## 2018-07-07 ENCOUNTER — Ambulatory Visit: Payer: PPO | Attending: Neurosurgery | Admitting: Physical Therapy

## 2018-07-07 ENCOUNTER — Other Ambulatory Visit: Payer: Self-pay

## 2018-07-07 DIAGNOSIS — M545 Low back pain, unspecified: Secondary | ICD-10-CM

## 2018-07-07 DIAGNOSIS — G8929 Other chronic pain: Secondary | ICD-10-CM | POA: Diagnosis not present

## 2018-07-07 DIAGNOSIS — R531 Weakness: Secondary | ICD-10-CM | POA: Diagnosis not present

## 2018-07-07 NOTE — Therapy (Signed)
[facility] [address] [place], [address] Phone: [phone]   Fax:  [phone]  Physical Therapy Evaluation  Patient Details  Name: Mckenzie Lawrence MRN: [id number] Date of Birth: 04-Apr-1946 No data recorded  Encounter Date: 07/07/2018    Past Medical History:  Diagnosis Date  . Arthritis   . Bladder incontinence   . Broken foot   . Cataracts, bilateral   . Chronic kidney insufficiency   . COPD (chronic obstructive pulmonary disease) (HCC)    wheezing  . CVA (cerebral vascular accident) ([place]) 2016  . Diabetes mellitus    insulin dependent, Type 2  . HLD (hyperlipidemia)   . Hx of cardiovascular stress test    a. ETT (6/13):  Ex 5:13; no ischemic changes  . Hypertension    controlled on meds  . Lacunar stroke of left subthalamic region [facility]) 02/2015  . Leg pain    left  . Lower back pain   . Neuromuscular disorder ([place])    stroke right hand tingling  . Orthostatic hypotension   . Osteopenia 01/2017   T score -2.0 stable from prior DEXA  . PCOS (polycystic ovarian syndrome)   . Personal history of tobacco use, presenting hazards to health 01/09/2015  . Sleep apnea    borderline/ CPAP    Past Surgical History:  Procedure Laterality Date  . BROW LIFT Bilateral 11/04/2017   Procedure: BLEPHAROPLASTY UPPER EYELID W/EXCESS SKIN;  Surgeon: Karle Starch, MD;  Location: [facility];  Service: Ophthalmology;  Laterality: Bilateral;  DIABETES-insulin dependent uses CPAP  . CARDIAC CATHETERIZATION  20 yrs ago   found nothing  . CARPAL TUNNEL RELEASE Bilateral   . CATARACT EXTRACTION Bilateral   . ELBOW SURGERY Bilateral   . FOOT SURGERY    . Groin Abscess    . HAND SURGERY    . KNEE SURGERY Bilateral   . LABIAL ABSCESS    . LUMBAR LAMINECTOMY/DECOMPRESSION MICRODISCECTOMY Left 05/11/2018   Procedure: LUMBAR LAMINECTOMY/DECOMPRESSION MICRODISCECTOMY 1 LEVEL- L4-5;  Surgeon: [name],  MD;  Location: [facility];  Service: Neurosurgery;  Laterality: Left;  . OOPHORECTOMY     BSO  . PUBO VAG SLING    . SHOULDER SURGERY     bilateral arthroscopies  . VAGINAL HYSTERECTOMY  1979    There were no vitals filed for this visit.    OBJECTIVE  Mental Status Patient is oriented to person, place and time.  Recent memory is intact.  Remote memory is intact.  Attention span and concentration are intact.  Expressive speech is intact.  Patient's fund of knowledge is within normal limits for educational level.  SENSATION: Sensation deficits B/L lower leg and feet secondary to peripheral neuropathy. Proprioception and hot/cold testing deferred on this date      MUSCULOSKELETAL: Tremor: None Bulk: Normal Tone: Normal  Posture Decreased lumbar lordosis, increased thoracic kyphosis, rounded shoulders, forward head posture.   Gait Shortened stride length B/L with decreased toe off. Decreased terminal hip extension B/L.    Palpation Palpation along paraspinals S2-T8 to assess if pt was tender to palpation. Pt had increased tenderness and muscle tension B/L along lumbar paraspinals. Dry scab along incision from surgery.    Strength (out of 5) R/L 4/4+ Hip flexion 5/4 Hip ER 4/4 Hip IR 4/4 Hip abduction 4+/4+ Hip adduction 3+/3+ Hip extension 5/4+ Knee extension 5/5 Knee flexion 5/5 Ankle dorsiflexion *Indicates pain   AROM (degrees) R/L (all movements include overpressure  unless otherwise stated) Lumbar forward flexion (0-65): wnl Lumbar extension (0-30): No over extension tested Lumbar lateral flexion (0-25): wnl b/l Lumbar rotation: wnl b/l All hip motion WFL with soft tissue restriction  PROM (degrees) PROM = AROM  Repeated Movements No centralization or peripheralization of symptoms with repeated lumbar extension or flexion.    SPECIAL TESTS SLR: negative b/l Crossed SLR: negative b/l FABER: Positive b/l FADIR: Positive b/l  90/90: positive  bilat [place]: Positive bilat Thomas: negative bilat 5xSTS 19sec Gait speed over 10 m: 0.83 m/s Forward Step-Down Test: Unable to go forward and back. Walked downstairs sideways leading with left LE. Unable to perform normal step-down without UE support.  Lateral Step-Down Test: Unable to control motion. B/L hip drop and knee valgus needing UE support. Deep Squat: Difficulty. Needed UE support. Knees buckled.  Therapeutic Exercise  Review of HEP exercises of supine bridges: 3x10, once a day to improve motor control of LE, and hamstring stretch due to tightness in hamstrings. B/L 30 second holds throughout the day to improve flexibility. Pt demonstrated exercises to indicate understanding and to demonstrate proper form to target correct muscle groups. Pt given handout of HEP.  Objective measurements completed on examination: See above findings.                             Patient will benefit from skilled therapeutic intervention in order to improve the following deficits and impairments:     Visit Diagnosis: No diagnosis found.     Problem List Patient Active Problem List   Diagnosis Date Noted  . CVA (cerebral vascular accident) ([place]) 03/25/2016  . Hyponatremia 09/25/2015  . Vitamin B12 deficiency 09/12/2015  . Lacunar stroke of left subthalamic region ([place]) 09/12/2015  . Diabetic polyneuropathy associated with diabetes mellitus due to underlying condition ([place]) 09/12/2015  . Stroke ([place]) 03/15/2015  . Accelerated hypertension 03/15/2015  . Syncope 12/04/2011  . Tobacco abuse 12/04/2011  . Elevated cholesterol   . Cataracts, bilateral   . Hypertension   . Broken foot   . PCOS (polycystic ovarian syndrome)   . Type II diabetes mellitus ([place])   . Osteopenia    Shelton Silvas PT, DPT [address] [place], [place] 07/07/2018, 4:01 PM  [facility] [address], [place],  [address] Phone: [phone]   Fax:  [phone]  Name: Mckenzie Lawrence MRN: [id number] Date of Birth: 11-19-1945

## 2018-07-08 NOTE — Addendum Note (Signed)
Addended by: Shelton Silvas on: 07/08/2018 10:38 AM   Modules accepted: Orders

## 2018-07-14 ENCOUNTER — Ambulatory Visit: Payer: PPO | Admitting: Physical Therapy

## 2018-07-17 ENCOUNTER — Encounter: Payer: PPO | Admitting: Physical Therapy

## 2018-07-20 ENCOUNTER — Ambulatory Visit: Payer: PPO | Admitting: Physical Therapy

## 2018-07-21 ENCOUNTER — Ambulatory Visit: Payer: PPO

## 2018-07-21 ENCOUNTER — Ambulatory Visit: Payer: PPO | Admitting: Podiatry

## 2018-07-21 ENCOUNTER — Encounter: Payer: Self-pay | Admitting: Podiatry

## 2018-07-21 DIAGNOSIS — M778 Other enthesopathies, not elsewhere classified: Secondary | ICD-10-CM

## 2018-07-21 DIAGNOSIS — M79676 Pain in unspecified toe(s): Secondary | ICD-10-CM

## 2018-07-21 DIAGNOSIS — B351 Tinea unguium: Secondary | ICD-10-CM

## 2018-07-21 DIAGNOSIS — M779 Enthesopathy, unspecified: Principal | ICD-10-CM

## 2018-07-21 DIAGNOSIS — Q828 Other specified congenital malformations of skin: Secondary | ICD-10-CM | POA: Diagnosis not present

## 2018-07-21 DIAGNOSIS — Z79899 Other long term (current) drug therapy: Secondary | ICD-10-CM | POA: Diagnosis not present

## 2018-07-21 NOTE — Progress Notes (Signed)
She presents today after having not seen her since 2017 with a chief complaint of painful elongated toenails and painful callus subsecond metatarsal phalangeal joint of the right foot.  Objective: Vital signs are stable alert oriented x3 reactive hyper keratoma second metatarsal phalangeal joint of the right foot with porokeratotic lesions.  Toenails are long thick yellow dystrophic.  Assessment: Pain in limb secondary to reactive hyperkeratotic lesion subsecond right.  And painful mycotic nails.  Plan: Debridement of toenails and reactive hyperkeratotic tissue bilaterally.

## 2018-07-22 DIAGNOSIS — M79642 Pain in left hand: Secondary | ICD-10-CM | POA: Diagnosis not present

## 2018-07-22 DIAGNOSIS — M79641 Pain in right hand: Secondary | ICD-10-CM | POA: Diagnosis not present

## 2018-07-22 DIAGNOSIS — M65332 Trigger finger, left middle finger: Secondary | ICD-10-CM | POA: Diagnosis not present

## 2018-07-23 ENCOUNTER — Encounter: Payer: PPO | Admitting: Physical Therapy

## 2018-07-23 DIAGNOSIS — E785 Hyperlipidemia, unspecified: Secondary | ICD-10-CM | POA: Diagnosis not present

## 2018-07-23 DIAGNOSIS — Z720 Tobacco use: Secondary | ICD-10-CM | POA: Diagnosis not present

## 2018-07-23 DIAGNOSIS — R609 Edema, unspecified: Secondary | ICD-10-CM | POA: Diagnosis not present

## 2018-07-23 DIAGNOSIS — I10 Essential (primary) hypertension: Secondary | ICD-10-CM | POA: Diagnosis not present

## 2018-07-23 DIAGNOSIS — G4733 Obstructive sleep apnea (adult) (pediatric): Secondary | ICD-10-CM | POA: Diagnosis not present

## 2018-07-27 ENCOUNTER — Encounter: Payer: PPO | Admitting: Physical Therapy

## 2018-07-29 ENCOUNTER — Ambulatory Visit: Payer: PPO | Admitting: Podiatry

## 2018-07-29 ENCOUNTER — Encounter: Payer: PPO | Admitting: Physical Therapy

## 2018-07-29 ENCOUNTER — Ambulatory Visit: Payer: PPO | Admitting: Physical Therapy

## 2018-08-03 ENCOUNTER — Encounter: Payer: PPO | Admitting: Physical Therapy

## 2018-08-05 ENCOUNTER — Encounter: Payer: PPO | Admitting: Physical Therapy

## 2018-08-06 DIAGNOSIS — I10 Essential (primary) hypertension: Secondary | ICD-10-CM | POA: Diagnosis not present

## 2018-08-06 DIAGNOSIS — E785 Hyperlipidemia, unspecified: Secondary | ICD-10-CM | POA: Diagnosis not present

## 2018-08-06 DIAGNOSIS — G4733 Obstructive sleep apnea (adult) (pediatric): Secondary | ICD-10-CM | POA: Diagnosis not present

## 2018-08-06 DIAGNOSIS — D649 Anemia, unspecified: Secondary | ICD-10-CM | POA: Diagnosis not present

## 2018-08-06 DIAGNOSIS — R609 Edema, unspecified: Secondary | ICD-10-CM | POA: Diagnosis not present

## 2018-08-10 ENCOUNTER — Encounter: Payer: PPO | Admitting: Physical Therapy

## 2018-08-12 ENCOUNTER — Encounter: Payer: PPO | Admitting: Physical Therapy

## 2018-08-13 ENCOUNTER — Encounter: Payer: PPO | Admitting: Physical Therapy

## 2018-08-17 ENCOUNTER — Encounter: Payer: PPO | Admitting: Physical Therapy

## 2018-08-19 ENCOUNTER — Encounter: Payer: PPO | Admitting: Physical Therapy

## 2018-08-20 ENCOUNTER — Encounter: Payer: PPO | Admitting: Physical Therapy

## 2018-08-24 ENCOUNTER — Encounter: Payer: PPO | Admitting: Physical Therapy

## 2018-08-26 ENCOUNTER — Encounter: Payer: PPO | Admitting: Physical Therapy

## 2018-08-27 ENCOUNTER — Encounter: Payer: PPO | Admitting: Physical Therapy

## 2018-08-31 ENCOUNTER — Encounter: Payer: PPO | Admitting: Physical Therapy

## 2018-08-31 DIAGNOSIS — G4733 Obstructive sleep apnea (adult) (pediatric): Secondary | ICD-10-CM | POA: Diagnosis not present

## 2018-09-03 ENCOUNTER — Encounter: Payer: PPO | Admitting: Physical Therapy

## 2018-09-07 ENCOUNTER — Encounter: Payer: PPO | Admitting: Physical Therapy

## 2018-09-10 ENCOUNTER — Encounter: Payer: PPO | Admitting: Physical Therapy

## 2018-09-25 DIAGNOSIS — G4733 Obstructive sleep apnea (adult) (pediatric): Secondary | ICD-10-CM | POA: Diagnosis not present

## 2018-10-02 DIAGNOSIS — I10 Essential (primary) hypertension: Secondary | ICD-10-CM | POA: Diagnosis not present

## 2018-10-02 DIAGNOSIS — N183 Chronic kidney disease, stage 3 (moderate): Secondary | ICD-10-CM | POA: Diagnosis not present

## 2018-10-02 DIAGNOSIS — R8290 Unspecified abnormal findings in urine: Secondary | ICD-10-CM | POA: Diagnosis not present

## 2018-10-02 DIAGNOSIS — D72829 Elevated white blood cell count, unspecified: Secondary | ICD-10-CM | POA: Diagnosis not present

## 2018-10-02 DIAGNOSIS — E1122 Type 2 diabetes mellitus with diabetic chronic kidney disease: Secondary | ICD-10-CM | POA: Diagnosis not present

## 2018-10-02 DIAGNOSIS — Z794 Long term (current) use of insulin: Secondary | ICD-10-CM | POA: Diagnosis not present

## 2018-10-06 DIAGNOSIS — E119 Type 2 diabetes mellitus without complications: Secondary | ICD-10-CM | POA: Diagnosis not present

## 2018-10-06 DIAGNOSIS — N390 Urinary tract infection, site not specified: Secondary | ICD-10-CM | POA: Diagnosis not present

## 2018-10-06 DIAGNOSIS — K219 Gastro-esophageal reflux disease without esophagitis: Secondary | ICD-10-CM | POA: Diagnosis not present

## 2018-10-06 DIAGNOSIS — I10 Essential (primary) hypertension: Secondary | ICD-10-CM | POA: Diagnosis not present

## 2018-10-26 DIAGNOSIS — I2510 Atherosclerotic heart disease of native coronary artery without angina pectoris: Secondary | ICD-10-CM | POA: Insufficient documentation

## 2018-10-26 DIAGNOSIS — I10 Essential (primary) hypertension: Secondary | ICD-10-CM | POA: Diagnosis not present

## 2018-10-28 DIAGNOSIS — M65332 Trigger finger, left middle finger: Secondary | ICD-10-CM | POA: Diagnosis not present

## 2018-11-04 DIAGNOSIS — Z4789 Encounter for other orthopedic aftercare: Secondary | ICD-10-CM | POA: Insufficient documentation

## 2018-11-24 DIAGNOSIS — G4733 Obstructive sleep apnea (adult) (pediatric): Secondary | ICD-10-CM | POA: Diagnosis not present

## 2018-12-07 DIAGNOSIS — I10 Essential (primary) hypertension: Secondary | ICD-10-CM | POA: Diagnosis not present

## 2018-12-07 DIAGNOSIS — E1159 Type 2 diabetes mellitus with other circulatory complications: Secondary | ICD-10-CM | POA: Diagnosis not present

## 2018-12-07 DIAGNOSIS — N183 Chronic kidney disease, stage 3 (moderate): Secondary | ICD-10-CM | POA: Diagnosis not present

## 2018-12-07 DIAGNOSIS — Z87440 Personal history of urinary (tract) infections: Secondary | ICD-10-CM | POA: Diagnosis not present

## 2018-12-15 DIAGNOSIS — Z79899 Other long term (current) drug therapy: Secondary | ICD-10-CM | POA: Diagnosis not present

## 2018-12-24 DIAGNOSIS — Z87440 Personal history of urinary (tract) infections: Secondary | ICD-10-CM | POA: Diagnosis not present

## 2019-02-01 DIAGNOSIS — N183 Chronic kidney disease, stage 3 (moderate): Secondary | ICD-10-CM | POA: Diagnosis not present

## 2019-02-01 DIAGNOSIS — E1122 Type 2 diabetes mellitus with diabetic chronic kidney disease: Secondary | ICD-10-CM | POA: Diagnosis not present

## 2019-02-01 DIAGNOSIS — R600 Localized edema: Secondary | ICD-10-CM | POA: Diagnosis not present

## 2019-02-02 DIAGNOSIS — M48061 Spinal stenosis, lumbar region without neurogenic claudication: Secondary | ICD-10-CM | POA: Diagnosis not present

## 2019-02-03 ENCOUNTER — Other Ambulatory Visit: Payer: Self-pay | Admitting: Nephrology

## 2019-02-03 DIAGNOSIS — N183 Chronic kidney disease, stage 3 unspecified: Secondary | ICD-10-CM

## 2019-02-09 DIAGNOSIS — R2681 Unsteadiness on feet: Secondary | ICD-10-CM | POA: Diagnosis not present

## 2019-02-09 DIAGNOSIS — M545 Low back pain: Secondary | ICD-10-CM | POA: Diagnosis not present

## 2019-02-09 DIAGNOSIS — R2689 Other abnormalities of gait and mobility: Secondary | ICD-10-CM | POA: Diagnosis not present

## 2019-02-11 DIAGNOSIS — M545 Low back pain: Secondary | ICD-10-CM | POA: Diagnosis not present

## 2019-02-11 DIAGNOSIS — R2681 Unsteadiness on feet: Secondary | ICD-10-CM | POA: Diagnosis not present

## 2019-02-11 DIAGNOSIS — R2689 Other abnormalities of gait and mobility: Secondary | ICD-10-CM | POA: Diagnosis not present

## 2019-02-15 DIAGNOSIS — N3020 Other chronic cystitis without hematuria: Secondary | ICD-10-CM | POA: Diagnosis not present

## 2019-02-16 ENCOUNTER — Ambulatory Visit: Payer: PPO

## 2019-02-17 ENCOUNTER — Ambulatory Visit
Admission: RE | Admit: 2019-02-17 | Discharge: 2019-02-17 | Disposition: A | Discharge status: Home / Self Care | Payer: PPO | Source: Ambulatory Visit | Attending: Nephrology | Admitting: Nephrology

## 2019-02-17 ENCOUNTER — Other Ambulatory Visit: Payer: Self-pay

## 2019-02-17 DIAGNOSIS — N183 Chronic kidney disease, stage 3 unspecified: Secondary | ICD-10-CM

## 2019-02-23 DIAGNOSIS — R2689 Other abnormalities of gait and mobility: Secondary | ICD-10-CM | POA: Diagnosis not present

## 2019-02-23 DIAGNOSIS — M545 Low back pain: Secondary | ICD-10-CM | POA: Diagnosis not present

## 2019-02-23 DIAGNOSIS — R2681 Unsteadiness on feet: Secondary | ICD-10-CM | POA: Diagnosis not present

## 2019-02-25 ENCOUNTER — Other Ambulatory Visit: Payer: Self-pay

## 2019-02-26 ENCOUNTER — Encounter: Payer: Self-pay | Admitting: Gynecology

## 2019-02-26 ENCOUNTER — Ambulatory Visit (INDEPENDENT_AMBULATORY_CARE_PROVIDER_SITE_OTHER): Payer: PPO | Admitting: Gynecology

## 2019-02-26 VITALS — BP 132/80 | Ht 67.0 in | Wt 200.0 lb

## 2019-02-26 DIAGNOSIS — R2681 Unsteadiness on feet: Secondary | ICD-10-CM | POA: Diagnosis not present

## 2019-02-26 DIAGNOSIS — M545 Low back pain: Secondary | ICD-10-CM | POA: Diagnosis not present

## 2019-02-26 DIAGNOSIS — Z01419 Encounter for gynecological examination (general) (routine) without abnormal findings: Secondary | ICD-10-CM

## 2019-02-26 DIAGNOSIS — R2689 Other abnormalities of gait and mobility: Secondary | ICD-10-CM | POA: Diagnosis not present

## 2019-02-26 DIAGNOSIS — M8580 Other specified disorders of bone density and structure, unspecified site: Secondary | ICD-10-CM

## 2019-02-26 DIAGNOSIS — N952 Postmenopausal atrophic vaginitis: Secondary | ICD-10-CM

## 2019-02-26 NOTE — Patient Instructions (Addendum)
Schedule your bone density with your mammogram.  Decide on how often you would like to be seen in our office as we discussed.

## 2019-02-26 NOTE — Progress Notes (Signed)
    Mckenzie Lawrence 1945/10/07 [id number]        73 y.o.  G0P0 for breast and pelvic exam  Past medical history,surgical history, problem list, medications, allergies, family history and social history were all reviewed and documented as reviewed in the EPIC chart.  ROS:  Performed with pertinent positives and negatives included in the history, assessment and plan.   Additional significant findings : None   Exam: Wandra Scot assistant Vitals:   02/26/19 1504  BP: 132/80  Weight: 200 lb (90.7 kg)  Height: 5\' 7"  (1.702 m)   Body mass index is 31.32 kg/m.  General appearance:  Normal affect, orientation and appearance. Skin: Grossly normal HEENT: Without gross lesions.  No cervical or supraclavicular adenopathy. Thyroid normal.  Lungs:  Clear without wheezing, rales or rhonchi Cardiac: RR, without RMG Abdominal:  Soft, nontender, without masses, guarding, rebound, organomegaly or hernia Breasts:  Examined lying and sitting without masses, retractions, discharge or axillary adenopathy. Pelvic:  Ext, BUS, Vagina: With atrophic changes  Adnexa: Without masses or tenderness    Anus and perineum: Normal   Rectovaginal: Normal sphincter tone without palpated masses or tenderness.    Assessment/Plan:  73 y.o. G0P0 female for breast and pelvic exam  1. Postmenopausal.  Status post TVH BSO pubovaginal sling.  No significant menopausal symptoms. 2. Osteopenia.  DEXA 2018 T score -2 stable from prior DEXA.  Had been on Prolia for 4 years but stopped in 2016.  Recommend follow-up DEXA now.  Patient prefers to arrange where she has her mammography is and we will go ahead and do this.  Will discuss possible treatment options if needed after this study. 3. Mammography 2019.  Continue with annual mammography when due.  Breast exam normal today. 4. Colonoscopy 9 years ago.  Plan on follow-up colonoscopy next year at 10-year interval. 5. Pap smear 2011.  No Pap smear done today.  No history of  abnormal Pap smears.  We both agree to stop screening per current screening guidelines. 6. Health maintenance.  No routine lab work done as patient does this elsewhere.  She asked about whether she needed to come back every year.  We discussed the issues of missed pathology versus likelihood of finding an abnormality at age 33 in an asymptomatic patient status post hysterectomy BSO.  Patient will decide exam intervals.   Anastasio Auerbach MD, 3:26 PM 02/26/2019

## 2019-03-01 DIAGNOSIS — E1122 Type 2 diabetes mellitus with diabetic chronic kidney disease: Secondary | ICD-10-CM | POA: Diagnosis not present

## 2019-03-01 DIAGNOSIS — R600 Localized edema: Secondary | ICD-10-CM | POA: Insufficient documentation

## 2019-03-01 DIAGNOSIS — N183 Chronic kidney disease, stage 3 unspecified: Secondary | ICD-10-CM | POA: Insufficient documentation

## 2019-03-01 DIAGNOSIS — R809 Proteinuria, unspecified: Secondary | ICD-10-CM | POA: Insufficient documentation

## 2019-03-02 ENCOUNTER — Telehealth: Payer: Self-pay | Admitting: *Deleted

## 2019-03-02 DIAGNOSIS — I10 Essential (primary) hypertension: Secondary | ICD-10-CM | POA: Diagnosis not present

## 2019-03-02 DIAGNOSIS — R609 Edema, unspecified: Secondary | ICD-10-CM | POA: Diagnosis not present

## 2019-03-02 DIAGNOSIS — Z720 Tobacco use: Secondary | ICD-10-CM | POA: Diagnosis not present

## 2019-03-02 DIAGNOSIS — I2510 Atherosclerotic heart disease of native coronary artery without angina pectoris: Secondary | ICD-10-CM | POA: Diagnosis not present

## 2019-03-02 NOTE — Telephone Encounter (Signed)
[facility] hospital called requesting order for Dexa faxed to [phone], per note patient was to schedule dexa at imaging center her choice.

## 2019-03-04 DIAGNOSIS — D2262 Melanocytic nevi of left upper limb, including shoulder: Secondary | ICD-10-CM | POA: Diagnosis not present

## 2019-03-04 DIAGNOSIS — M545 Low back pain: Secondary | ICD-10-CM | POA: Diagnosis not present

## 2019-03-04 DIAGNOSIS — L732 Hidradenitis suppurativa: Secondary | ICD-10-CM | POA: Diagnosis not present

## 2019-03-04 DIAGNOSIS — L821 Other seborrheic keratosis: Secondary | ICD-10-CM | POA: Diagnosis not present

## 2019-03-04 DIAGNOSIS — R2681 Unsteadiness on feet: Secondary | ICD-10-CM | POA: Diagnosis not present

## 2019-03-04 DIAGNOSIS — D225 Melanocytic nevi of trunk: Secondary | ICD-10-CM | POA: Diagnosis not present

## 2019-03-04 DIAGNOSIS — R2689 Other abnormalities of gait and mobility: Secondary | ICD-10-CM | POA: Diagnosis not present

## 2019-03-04 DIAGNOSIS — D2261 Melanocytic nevi of right upper limb, including shoulder: Secondary | ICD-10-CM | POA: Diagnosis not present

## 2019-03-08 ENCOUNTER — Telehealth: Payer: Self-pay | Admitting: *Deleted

## 2019-03-08 DIAGNOSIS — Z122 Encounter for screening for malignant neoplasm of respiratory organs: Secondary | ICD-10-CM

## 2019-03-08 DIAGNOSIS — Z87891 Personal history of nicotine dependence: Secondary | ICD-10-CM

## 2019-03-08 NOTE — Telephone Encounter (Signed)
Patient has been notified that annual lung cancer screening low dose CT scan is due currently or will be in near future. Confirmed that patient is within the age range of 55-77, and asymptomatic, (no signs or symptoms of lung cancer). Patient denies illness that would prevent curative treatment for lung cancer if found. Verified smoking history, (current, 108 pack year). The shared decision making visit was done 01/09/15. Patient is agreeable for CT scan being scheduled.

## 2019-03-09 DIAGNOSIS — M545 Low back pain: Secondary | ICD-10-CM | POA: Diagnosis not present

## 2019-03-09 DIAGNOSIS — R2689 Other abnormalities of gait and mobility: Secondary | ICD-10-CM | POA: Diagnosis not present

## 2019-03-09 DIAGNOSIS — R2681 Unsteadiness on feet: Secondary | ICD-10-CM | POA: Diagnosis not present

## 2019-03-10 ENCOUNTER — Other Ambulatory Visit: Payer: Self-pay

## 2019-03-10 ENCOUNTER — Ambulatory Visit
Admission: RE | Admit: 2019-03-10 | Discharge: 2019-03-10 | Disposition: A | Discharge status: Home / Self Care | Payer: PPO | Source: Ambulatory Visit | Attending: Nurse Practitioner | Admitting: Nurse Practitioner

## 2019-03-10 DIAGNOSIS — Z87891 Personal history of nicotine dependence: Secondary | ICD-10-CM | POA: Diagnosis not present

## 2019-03-10 DIAGNOSIS — Z122 Encounter for screening for malignant neoplasm of respiratory organs: Secondary | ICD-10-CM | POA: Diagnosis not present

## 2019-03-11 DIAGNOSIS — R2681 Unsteadiness on feet: Secondary | ICD-10-CM | POA: Diagnosis not present

## 2019-03-11 DIAGNOSIS — M545 Low back pain: Secondary | ICD-10-CM | POA: Diagnosis not present

## 2019-03-11 DIAGNOSIS — R2689 Other abnormalities of gait and mobility: Secondary | ICD-10-CM | POA: Diagnosis not present

## 2019-03-12 ENCOUNTER — Other Ambulatory Visit: Payer: Self-pay | Admitting: Nurse Practitioner

## 2019-03-12 NOTE — Progress Notes (Signed)
Results of low dose ct provided to patient. Also provided her with her login information for her mychart account.

## 2019-03-15 DIAGNOSIS — R252 Cramp and spasm: Secondary | ICD-10-CM | POA: Diagnosis not present

## 2019-03-15 DIAGNOSIS — M791 Myalgia, unspecified site: Secondary | ICD-10-CM | POA: Diagnosis not present

## 2019-03-16 DIAGNOSIS — M545 Low back pain: Secondary | ICD-10-CM | POA: Diagnosis not present

## 2019-03-16 DIAGNOSIS — R2681 Unsteadiness on feet: Secondary | ICD-10-CM | POA: Diagnosis not present

## 2019-03-16 DIAGNOSIS — R2689 Other abnormalities of gait and mobility: Secondary | ICD-10-CM | POA: Diagnosis not present

## 2019-03-23 DIAGNOSIS — R2681 Unsteadiness on feet: Secondary | ICD-10-CM | POA: Diagnosis not present

## 2019-03-23 DIAGNOSIS — M545 Low back pain: Secondary | ICD-10-CM | POA: Diagnosis not present

## 2019-03-23 DIAGNOSIS — R2689 Other abnormalities of gait and mobility: Secondary | ICD-10-CM | POA: Diagnosis not present

## 2019-03-24 ENCOUNTER — Other Ambulatory Visit: Payer: Self-pay | Admitting: Neurosurgery

## 2019-03-24 ENCOUNTER — Encounter: Payer: Self-pay | Admitting: Gynecology

## 2019-03-24 DIAGNOSIS — M545 Low back pain, unspecified: Secondary | ICD-10-CM

## 2019-03-25 DIAGNOSIS — R2681 Unsteadiness on feet: Secondary | ICD-10-CM | POA: Diagnosis not present

## 2019-03-25 DIAGNOSIS — R2689 Other abnormalities of gait and mobility: Secondary | ICD-10-CM | POA: Diagnosis not present

## 2019-03-25 DIAGNOSIS — M545 Low back pain: Secondary | ICD-10-CM | POA: Diagnosis not present

## 2019-03-30 ENCOUNTER — Encounter: Payer: Self-pay | Admitting: Gynecology

## 2019-03-30 DIAGNOSIS — E2839 Other primary ovarian failure: Secondary | ICD-10-CM | POA: Diagnosis not present

## 2019-03-30 DIAGNOSIS — Z780 Asymptomatic menopausal state: Secondary | ICD-10-CM | POA: Diagnosis not present

## 2019-03-30 DIAGNOSIS — R2689 Other abnormalities of gait and mobility: Secondary | ICD-10-CM | POA: Diagnosis not present

## 2019-03-30 DIAGNOSIS — M545 Low back pain: Secondary | ICD-10-CM | POA: Diagnosis not present

## 2019-03-30 DIAGNOSIS — M85852 Other specified disorders of bone density and structure, left thigh: Secondary | ICD-10-CM | POA: Diagnosis not present

## 2019-03-30 DIAGNOSIS — R2681 Unsteadiness on feet: Secondary | ICD-10-CM | POA: Diagnosis not present

## 2019-03-30 DIAGNOSIS — Z1231 Encounter for screening mammogram for malignant neoplasm of breast: Secondary | ICD-10-CM | POA: Diagnosis not present

## 2019-04-01 DIAGNOSIS — R2681 Unsteadiness on feet: Secondary | ICD-10-CM | POA: Diagnosis not present

## 2019-04-01 DIAGNOSIS — M545 Low back pain: Secondary | ICD-10-CM | POA: Diagnosis not present

## 2019-04-01 DIAGNOSIS — R2689 Other abnormalities of gait and mobility: Secondary | ICD-10-CM | POA: Diagnosis not present

## 2019-04-05 ENCOUNTER — Ambulatory Visit
Admission: RE | Admit: 2019-04-05 | Discharge: 2019-04-05 | Disposition: A | Discharge status: Home / Self Care | Payer: PPO | Source: Ambulatory Visit | Attending: Neurosurgery | Admitting: Neurosurgery

## 2019-04-05 ENCOUNTER — Other Ambulatory Visit: Payer: Self-pay

## 2019-04-05 DIAGNOSIS — M48061 Spinal stenosis, lumbar region without neurogenic claudication: Secondary | ICD-10-CM | POA: Diagnosis not present

## 2019-04-05 DIAGNOSIS — M545 Low back pain, unspecified: Secondary | ICD-10-CM

## 2019-04-06 DIAGNOSIS — M5416 Radiculopathy, lumbar region: Secondary | ICD-10-CM | POA: Diagnosis not present

## 2019-04-09 ENCOUNTER — Other Ambulatory Visit: Payer: PPO

## 2019-04-13 DIAGNOSIS — R2689 Other abnormalities of gait and mobility: Secondary | ICD-10-CM | POA: Diagnosis not present

## 2019-04-13 DIAGNOSIS — H353132 Nonexudative age-related macular degeneration, bilateral, intermediate dry stage: Secondary | ICD-10-CM | POA: Diagnosis not present

## 2019-04-13 DIAGNOSIS — M545 Low back pain: Secondary | ICD-10-CM | POA: Diagnosis not present

## 2019-04-13 DIAGNOSIS — R2681 Unsteadiness on feet: Secondary | ICD-10-CM | POA: Diagnosis not present

## 2019-04-15 DIAGNOSIS — R2689 Other abnormalities of gait and mobility: Secondary | ICD-10-CM | POA: Diagnosis not present

## 2019-04-15 DIAGNOSIS — M545 Low back pain: Secondary | ICD-10-CM | POA: Diagnosis not present

## 2019-04-15 DIAGNOSIS — R2681 Unsteadiness on feet: Secondary | ICD-10-CM | POA: Diagnosis not present

## 2019-04-19 DIAGNOSIS — M5432 Sciatica, left side: Secondary | ICD-10-CM | POA: Diagnosis not present

## 2019-04-19 DIAGNOSIS — M9903 Segmental and somatic dysfunction of lumbar region: Secondary | ICD-10-CM | POA: Diagnosis not present

## 2019-04-19 DIAGNOSIS — M9905 Segmental and somatic dysfunction of pelvic region: Secondary | ICD-10-CM | POA: Diagnosis not present

## 2019-04-19 DIAGNOSIS — M5136 Other intervertebral disc degeneration, lumbar region: Secondary | ICD-10-CM | POA: Diagnosis not present

## 2019-04-21 DIAGNOSIS — M5136 Other intervertebral disc degeneration, lumbar region: Secondary | ICD-10-CM | POA: Diagnosis not present

## 2019-04-21 DIAGNOSIS — M9903 Segmental and somatic dysfunction of lumbar region: Secondary | ICD-10-CM | POA: Diagnosis not present

## 2019-04-21 DIAGNOSIS — M9905 Segmental and somatic dysfunction of pelvic region: Secondary | ICD-10-CM | POA: Diagnosis not present

## 2019-04-21 DIAGNOSIS — M5432 Sciatica, left side: Secondary | ICD-10-CM | POA: Diagnosis not present

## 2019-04-22 DIAGNOSIS — M9903 Segmental and somatic dysfunction of lumbar region: Secondary | ICD-10-CM | POA: Diagnosis not present

## 2019-04-22 DIAGNOSIS — M9905 Segmental and somatic dysfunction of pelvic region: Secondary | ICD-10-CM | POA: Diagnosis not present

## 2019-04-22 DIAGNOSIS — M5432 Sciatica, left side: Secondary | ICD-10-CM | POA: Diagnosis not present

## 2019-04-22 DIAGNOSIS — M5136 Other intervertebral disc degeneration, lumbar region: Secondary | ICD-10-CM | POA: Diagnosis not present

## 2019-04-26 DIAGNOSIS — M9903 Segmental and somatic dysfunction of lumbar region: Secondary | ICD-10-CM | POA: Diagnosis not present

## 2019-04-26 DIAGNOSIS — M5432 Sciatica, left side: Secondary | ICD-10-CM | POA: Diagnosis not present

## 2019-04-26 DIAGNOSIS — M9905 Segmental and somatic dysfunction of pelvic region: Secondary | ICD-10-CM | POA: Diagnosis not present

## 2019-04-26 DIAGNOSIS — M5136 Other intervertebral disc degeneration, lumbar region: Secondary | ICD-10-CM | POA: Diagnosis not present

## 2019-04-27 DIAGNOSIS — M5432 Sciatica, left side: Secondary | ICD-10-CM | POA: Diagnosis not present

## 2019-04-27 DIAGNOSIS — M9905 Segmental and somatic dysfunction of pelvic region: Secondary | ICD-10-CM | POA: Diagnosis not present

## 2019-04-27 DIAGNOSIS — M9903 Segmental and somatic dysfunction of lumbar region: Secondary | ICD-10-CM | POA: Diagnosis not present

## 2019-04-27 DIAGNOSIS — M5136 Other intervertebral disc degeneration, lumbar region: Secondary | ICD-10-CM | POA: Diagnosis not present

## 2019-04-28 DIAGNOSIS — N3020 Other chronic cystitis without hematuria: Secondary | ICD-10-CM | POA: Diagnosis not present

## 2019-04-28 DIAGNOSIS — N3000 Acute cystitis without hematuria: Secondary | ICD-10-CM | POA: Diagnosis not present

## 2019-04-29 DIAGNOSIS — S60455A Superficial foreign body of left ring finger, initial encounter: Secondary | ICD-10-CM | POA: Diagnosis not present

## 2019-04-29 DIAGNOSIS — M79642 Pain in left hand: Secondary | ICD-10-CM | POA: Diagnosis not present

## 2019-04-29 DIAGNOSIS — M9905 Segmental and somatic dysfunction of pelvic region: Secondary | ICD-10-CM | POA: Diagnosis not present

## 2019-04-29 DIAGNOSIS — M79641 Pain in right hand: Secondary | ICD-10-CM | POA: Diagnosis not present

## 2019-04-29 DIAGNOSIS — M9903 Segmental and somatic dysfunction of lumbar region: Secondary | ICD-10-CM | POA: Diagnosis not present

## 2019-04-29 DIAGNOSIS — M5432 Sciatica, left side: Secondary | ICD-10-CM | POA: Diagnosis not present

## 2019-04-29 DIAGNOSIS — M65321 Trigger finger, right index finger: Secondary | ICD-10-CM | POA: Diagnosis not present

## 2019-04-29 DIAGNOSIS — M5136 Other intervertebral disc degeneration, lumbar region: Secondary | ICD-10-CM | POA: Diagnosis not present

## 2019-04-30 DIAGNOSIS — E1159 Type 2 diabetes mellitus with other circulatory complications: Secondary | ICD-10-CM | POA: Diagnosis not present

## 2019-05-04 DIAGNOSIS — M9905 Segmental and somatic dysfunction of pelvic region: Secondary | ICD-10-CM | POA: Diagnosis not present

## 2019-05-04 DIAGNOSIS — M5136 Other intervertebral disc degeneration, lumbar region: Secondary | ICD-10-CM | POA: Diagnosis not present

## 2019-05-04 DIAGNOSIS — M5432 Sciatica, left side: Secondary | ICD-10-CM | POA: Diagnosis not present

## 2019-05-04 DIAGNOSIS — M9903 Segmental and somatic dysfunction of lumbar region: Secondary | ICD-10-CM | POA: Diagnosis not present

## 2019-05-06 DIAGNOSIS — M9903 Segmental and somatic dysfunction of lumbar region: Secondary | ICD-10-CM | POA: Diagnosis not present

## 2019-05-06 DIAGNOSIS — E876 Hypokalemia: Secondary | ICD-10-CM | POA: Diagnosis not present

## 2019-05-06 DIAGNOSIS — M5136 Other intervertebral disc degeneration, lumbar region: Secondary | ICD-10-CM | POA: Diagnosis not present

## 2019-05-06 DIAGNOSIS — M9905 Segmental and somatic dysfunction of pelvic region: Secondary | ICD-10-CM | POA: Diagnosis not present

## 2019-05-06 DIAGNOSIS — M5432 Sciatica, left side: Secondary | ICD-10-CM | POA: Diagnosis not present

## 2019-05-10 DIAGNOSIS — M5136 Other intervertebral disc degeneration, lumbar region: Secondary | ICD-10-CM | POA: Diagnosis not present

## 2019-05-10 DIAGNOSIS — M9905 Segmental and somatic dysfunction of pelvic region: Secondary | ICD-10-CM | POA: Diagnosis not present

## 2019-05-10 DIAGNOSIS — M9903 Segmental and somatic dysfunction of lumbar region: Secondary | ICD-10-CM | POA: Diagnosis not present

## 2019-05-10 DIAGNOSIS — M5432 Sciatica, left side: Secondary | ICD-10-CM | POA: Diagnosis not present

## 2019-05-12 DIAGNOSIS — M5432 Sciatica, left side: Secondary | ICD-10-CM | POA: Diagnosis not present

## 2019-05-12 DIAGNOSIS — M9903 Segmental and somatic dysfunction of lumbar region: Secondary | ICD-10-CM | POA: Diagnosis not present

## 2019-05-12 DIAGNOSIS — M5136 Other intervertebral disc degeneration, lumbar region: Secondary | ICD-10-CM | POA: Diagnosis not present

## 2019-05-12 DIAGNOSIS — M9905 Segmental and somatic dysfunction of pelvic region: Secondary | ICD-10-CM | POA: Diagnosis not present

## 2019-05-18 DIAGNOSIS — M9903 Segmental and somatic dysfunction of lumbar region: Secondary | ICD-10-CM | POA: Diagnosis not present

## 2019-05-18 DIAGNOSIS — M5136 Other intervertebral disc degeneration, lumbar region: Secondary | ICD-10-CM | POA: Diagnosis not present

## 2019-05-18 DIAGNOSIS — M5432 Sciatica, left side: Secondary | ICD-10-CM | POA: Diagnosis not present

## 2019-05-18 DIAGNOSIS — M9905 Segmental and somatic dysfunction of pelvic region: Secondary | ICD-10-CM | POA: Diagnosis not present

## 2019-05-25 DIAGNOSIS — M5136 Other intervertebral disc degeneration, lumbar region: Secondary | ICD-10-CM | POA: Diagnosis not present

## 2019-05-25 DIAGNOSIS — M5432 Sciatica, left side: Secondary | ICD-10-CM | POA: Diagnosis not present

## 2019-05-25 DIAGNOSIS — M9903 Segmental and somatic dysfunction of lumbar region: Secondary | ICD-10-CM | POA: Diagnosis not present

## 2019-05-25 DIAGNOSIS — M9905 Segmental and somatic dysfunction of pelvic region: Secondary | ICD-10-CM | POA: Diagnosis not present

## 2019-06-17 DIAGNOSIS — G4733 Obstructive sleep apnea (adult) (pediatric): Secondary | ICD-10-CM | POA: Diagnosis not present

## 2019-06-18 DIAGNOSIS — U071 COVID-19: Secondary | ICD-10-CM

## 2019-06-18 HISTORY — DX: COVID-19: U07.1

## 2019-06-29 DIAGNOSIS — C44722 Squamous cell carcinoma of skin of right lower limb, including hip: Secondary | ICD-10-CM | POA: Diagnosis not present

## 2019-06-29 DIAGNOSIS — L570 Actinic keratosis: Secondary | ICD-10-CM | POA: Diagnosis not present

## 2019-06-29 DIAGNOSIS — D485 Neoplasm of uncertain behavior of skin: Secondary | ICD-10-CM | POA: Diagnosis not present

## 2019-07-01 DIAGNOSIS — N1832 Chronic kidney disease, stage 3b: Secondary | ICD-10-CM | POA: Diagnosis not present

## 2019-07-01 DIAGNOSIS — D631 Anemia in chronic kidney disease: Secondary | ICD-10-CM | POA: Diagnosis not present

## 2019-07-01 DIAGNOSIS — N2581 Secondary hyperparathyroidism of renal origin: Secondary | ICD-10-CM | POA: Diagnosis not present

## 2019-07-01 DIAGNOSIS — R600 Localized edema: Secondary | ICD-10-CM | POA: Diagnosis not present

## 2019-07-01 DIAGNOSIS — E1122 Type 2 diabetes mellitus with diabetic chronic kidney disease: Secondary | ICD-10-CM | POA: Diagnosis not present

## 2019-07-01 DIAGNOSIS — I10 Essential (primary) hypertension: Secondary | ICD-10-CM | POA: Diagnosis not present

## 2019-07-06 DIAGNOSIS — M5432 Sciatica, left side: Secondary | ICD-10-CM | POA: Diagnosis not present

## 2019-07-06 DIAGNOSIS — M9903 Segmental and somatic dysfunction of lumbar region: Secondary | ICD-10-CM | POA: Diagnosis not present

## 2019-07-06 DIAGNOSIS — M9905 Segmental and somatic dysfunction of pelvic region: Secondary | ICD-10-CM | POA: Diagnosis not present

## 2019-07-06 DIAGNOSIS — M5136 Other intervertebral disc degeneration, lumbar region: Secondary | ICD-10-CM | POA: Diagnosis not present

## 2019-07-07 ENCOUNTER — Ambulatory Visit: Payer: PPO | Attending: Family Medicine

## 2019-07-07 DIAGNOSIS — Z23 Encounter for immunization: Secondary | ICD-10-CM

## 2019-07-07 DIAGNOSIS — Z720 Tobacco use: Secondary | ICD-10-CM | POA: Diagnosis not present

## 2019-07-07 DIAGNOSIS — F418 Other specified anxiety disorders: Secondary | ICD-10-CM | POA: Diagnosis not present

## 2019-07-07 DIAGNOSIS — I10 Essential (primary) hypertension: Secondary | ICD-10-CM | POA: Diagnosis not present

## 2019-07-07 DIAGNOSIS — R609 Edema, unspecified: Secondary | ICD-10-CM | POA: Diagnosis not present

## 2019-07-07 NOTE — Progress Notes (Signed)
   Covid-19 Vaccination Clinic  Name:  LACYE MCCARN    MRN: [id number] DOB: 07-28-1945  07/07/2019  Ms. Evers was observed post Covid-19 immunization for 15 minutes without incidence. She was provided with Vaccine Information Sheet and instruction to access the V-Safe system.   Ms. Greenlaw was instructed to call 911 with any severe reactions post vaccine: Marland Kitchen Difficulty breathing  . Swelling of your face and throat  . A fast heartbeat  . A bad rash all over your body  . Dizziness and weakness    Immunizations Administered    Name Date Dose VIS Date Route   Pfizer COVID-19 Vaccine 07/07/2019  8:59 AM 0.3 mL 05/28/2019 Intramuscular   Manufacturer: [employer], [employer]   Lot: [id number]   [place]: [address]-4

## 2019-07-08 DIAGNOSIS — M9905 Segmental and somatic dysfunction of pelvic region: Secondary | ICD-10-CM | POA: Diagnosis not present

## 2019-07-08 DIAGNOSIS — M5136 Other intervertebral disc degeneration, lumbar region: Secondary | ICD-10-CM | POA: Diagnosis not present

## 2019-07-08 DIAGNOSIS — F418 Other specified anxiety disorders: Secondary | ICD-10-CM | POA: Insufficient documentation

## 2019-07-08 DIAGNOSIS — M9903 Segmental and somatic dysfunction of lumbar region: Secondary | ICD-10-CM | POA: Diagnosis not present

## 2019-07-08 DIAGNOSIS — M5432 Sciatica, left side: Secondary | ICD-10-CM | POA: Diagnosis not present

## 2019-07-09 DIAGNOSIS — M5136 Other intervertebral disc degeneration, lumbar region: Secondary | ICD-10-CM | POA: Diagnosis not present

## 2019-07-09 DIAGNOSIS — M9903 Segmental and somatic dysfunction of lumbar region: Secondary | ICD-10-CM | POA: Diagnosis not present

## 2019-07-09 DIAGNOSIS — M5432 Sciatica, left side: Secondary | ICD-10-CM | POA: Diagnosis not present

## 2019-07-09 DIAGNOSIS — M9905 Segmental and somatic dysfunction of pelvic region: Secondary | ICD-10-CM | POA: Diagnosis not present

## 2019-07-13 DIAGNOSIS — M5416 Radiculopathy, lumbar region: Secondary | ICD-10-CM | POA: Diagnosis not present

## 2019-07-20 ENCOUNTER — Other Ambulatory Visit: Payer: Self-pay | Admitting: Neurosurgery

## 2019-07-20 DIAGNOSIS — M5416 Radiculopathy, lumbar region: Secondary | ICD-10-CM

## 2019-07-23 ENCOUNTER — Telehealth: Payer: Self-pay | Admitting: Nurse Practitioner

## 2019-07-23 NOTE — Telephone Encounter (Signed)
Phone call to patient to review instructions for 13 hr prep for epidural steroid injection w/ contrast on 2/10 at 1100. LMOM for pt to call back. Prescription called into [facility]. Prescription: 2200- 50mg  Prednisone 0400- 50mg  Prednisone 1000- 50mg  Prednisone and 50mg  Benadryl

## 2019-07-26 ENCOUNTER — Ambulatory Visit: Payer: PPO | Attending: Internal Medicine

## 2019-07-26 DIAGNOSIS — Z23 Encounter for immunization: Secondary | ICD-10-CM

## 2019-07-26 NOTE — Progress Notes (Signed)
   Covid-19 Vaccination Clinic  Name:  Mckenzie Lawrence    MRN: [id number] DOB: [birth year]  07/26/2019  [name] was observed post Covid-19 immunization for 30 minutes based on pre-vaccination screening without incidence. She was provided with Vaccine Information Sheet and instruction to access the V-Safe system.   [name] was instructed to call 911 with any severe reactions post vaccine: Marland Kitchen Difficulty breathing  . Swelling of your face and throat  . A fast heartbeat  . A bad rash all over your body  . Dizziness and weakness    Immunizations Administered    Name Date Dose VIS Date Route   Pfizer COVID-19 Vaccine 07/26/2019 10:34 AM 0.3 mL 05/28/2019 Intramuscular   Manufacturer: [employer]   Lot: [id number]   [place]: [id number]

## 2019-07-28 ENCOUNTER — Ambulatory Visit
Admission: RE | Admit: 2019-07-28 | Discharge: 2019-07-28 | Disposition: A | Discharge status: Home / Self Care | Payer: PPO | Source: Ambulatory Visit | Attending: Neurosurgery | Admitting: Neurosurgery

## 2019-07-28 ENCOUNTER — Other Ambulatory Visit: Payer: Self-pay | Admitting: Neurosurgery

## 2019-07-28 DIAGNOSIS — M5416 Radiculopathy, lumbar region: Secondary | ICD-10-CM

## 2019-07-28 DIAGNOSIS — M545 Low back pain: Secondary | ICD-10-CM | POA: Diagnosis not present

## 2019-07-28 MED ORDER — IOPAMIDOL (ISOVUE-M 200) INJECTION 41%
1.0000 mL | Freq: Once | INTRAMUSCULAR | Status: AC
  Administered 2019-07-28: 1 mL via EPIDURAL

## 2019-07-28 MED ORDER — METHYLPREDNISOLONE ACETATE 40 MG/ML INJ SUSP (RADIOLOG
120.0000 mg | Freq: Once | INTRAMUSCULAR | Status: AC
  Administered 2019-07-28: 120 mg via EPIDURAL

## 2019-07-28 NOTE — Discharge Instructions (Signed)

## 2019-08-02 DIAGNOSIS — L570 Actinic keratosis: Secondary | ICD-10-CM | POA: Diagnosis not present

## 2019-08-02 DIAGNOSIS — C44722 Squamous cell carcinoma of skin of right lower limb, including hip: Secondary | ICD-10-CM | POA: Diagnosis not present

## 2019-08-02 DIAGNOSIS — D485 Neoplasm of uncertain behavior of skin: Secondary | ICD-10-CM | POA: Diagnosis not present

## 2019-08-10 DIAGNOSIS — H353132 Nonexudative age-related macular degeneration, bilateral, intermediate dry stage: Secondary | ICD-10-CM | POA: Diagnosis not present

## 2019-08-10 DIAGNOSIS — E119 Type 2 diabetes mellitus without complications: Secondary | ICD-10-CM | POA: Diagnosis not present

## 2019-08-10 DIAGNOSIS — L570 Actinic keratosis: Secondary | ICD-10-CM | POA: Diagnosis not present

## 2019-08-23 DIAGNOSIS — G4733 Obstructive sleep apnea (adult) (pediatric): Secondary | ICD-10-CM | POA: Diagnosis not present

## 2019-08-24 ENCOUNTER — Other Ambulatory Visit (HOSPITAL_COMMUNITY): Payer: Self-pay | Admitting: Neurosurgery

## 2019-08-24 ENCOUNTER — Other Ambulatory Visit: Payer: Self-pay | Admitting: Neurosurgery

## 2019-08-24 DIAGNOSIS — M5416 Radiculopathy, lumbar region: Secondary | ICD-10-CM | POA: Diagnosis not present

## 2019-08-24 DIAGNOSIS — M7989 Other specified soft tissue disorders: Secondary | ICD-10-CM

## 2019-08-25 ENCOUNTER — Ambulatory Visit: Admission: RE | Admit: 2019-08-25 | Payer: PPO | Source: Ambulatory Visit

## 2019-08-26 ENCOUNTER — Other Ambulatory Visit: Payer: Self-pay

## 2019-08-26 ENCOUNTER — Ambulatory Visit
Admission: RE | Admit: 2019-08-26 | Discharge: 2019-08-26 | Disposition: A | Discharge status: Home / Self Care | Payer: PPO | Source: Ambulatory Visit | Attending: Neurosurgery | Admitting: Neurosurgery

## 2019-08-26 DIAGNOSIS — R600 Localized edema: Secondary | ICD-10-CM | POA: Diagnosis not present

## 2019-08-26 DIAGNOSIS — M7989 Other specified soft tissue disorders: Secondary | ICD-10-CM

## 2019-09-06 DIAGNOSIS — I10 Essential (primary) hypertension: Secondary | ICD-10-CM | POA: Diagnosis not present

## 2019-09-06 DIAGNOSIS — Z720 Tobacco use: Secondary | ICD-10-CM | POA: Diagnosis not present

## 2019-09-06 DIAGNOSIS — R609 Edema, unspecified: Secondary | ICD-10-CM | POA: Diagnosis not present

## 2019-09-06 DIAGNOSIS — E785 Hyperlipidemia, unspecified: Secondary | ICD-10-CM | POA: Diagnosis not present

## 2019-09-06 DIAGNOSIS — I2510 Atherosclerotic heart disease of native coronary artery without angina pectoris: Secondary | ICD-10-CM | POA: Diagnosis not present

## 2019-11-16 DIAGNOSIS — E119 Type 2 diabetes mellitus without complications: Secondary | ICD-10-CM | POA: Diagnosis not present

## 2019-11-16 DIAGNOSIS — Z794 Long term (current) use of insulin: Secondary | ICD-10-CM | POA: Diagnosis not present

## 2019-11-16 DIAGNOSIS — I10 Essential (primary) hypertension: Secondary | ICD-10-CM | POA: Diagnosis not present

## 2019-11-18 DIAGNOSIS — I10 Essential (primary) hypertension: Secondary | ICD-10-CM | POA: Diagnosis not present

## 2019-11-22 DIAGNOSIS — J449 Chronic obstructive pulmonary disease, unspecified: Secondary | ICD-10-CM | POA: Diagnosis not present

## 2019-11-22 DIAGNOSIS — R600 Localized edema: Secondary | ICD-10-CM | POA: Diagnosis not present

## 2019-11-22 DIAGNOSIS — I10 Essential (primary) hypertension: Secondary | ICD-10-CM | POA: Diagnosis not present

## 2019-11-22 DIAGNOSIS — N183 Chronic kidney disease, stage 3 unspecified: Secondary | ICD-10-CM | POA: Diagnosis not present

## 2019-11-22 DIAGNOSIS — E1151 Type 2 diabetes mellitus with diabetic peripheral angiopathy without gangrene: Secondary | ICD-10-CM | POA: Diagnosis not present

## 2019-11-27 IMAGING — CT CT CHEST LUNG CANCER SCREENING LOW DOSE W/O CM
2 of 5 series · 15 of 40 positions shown, 18 images · non-contrast
Comparison: Low-dose lung cancer screening chest CT 02/25/2017.

CLINICAL DATA: 72-year-old female current smoker with 106 pack-year
history of smoking. Lung cancer screening examination.

EXAM:
CT CHEST WITHOUT CONTRAST LOW-DOSE FOR LUNG CANCER SCREENING
TECHNIQUE: Multidetector CT imaging of the chest was performed following the
standard protocol without IV contrast.

[Series 3: lung · axial · 0.73mm/px · z∈[-1279,-958]mm · 12 of 357 slices shown, 15 images (1 of 2)]
[im 18/357  mediastinal]
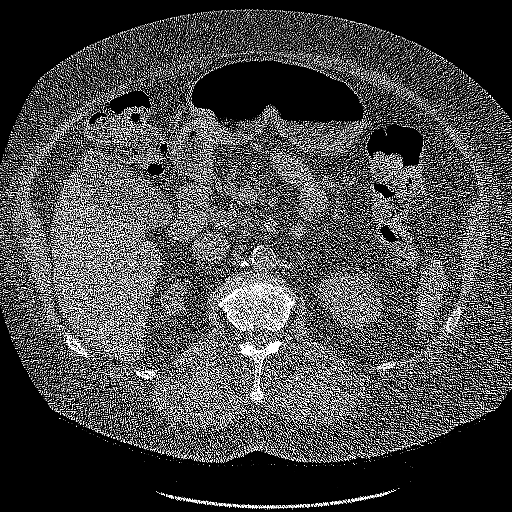
[im 18/357  lung]
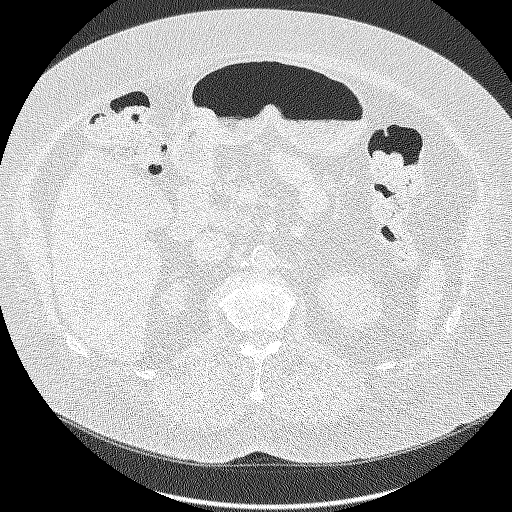
[im 54/357  lung]
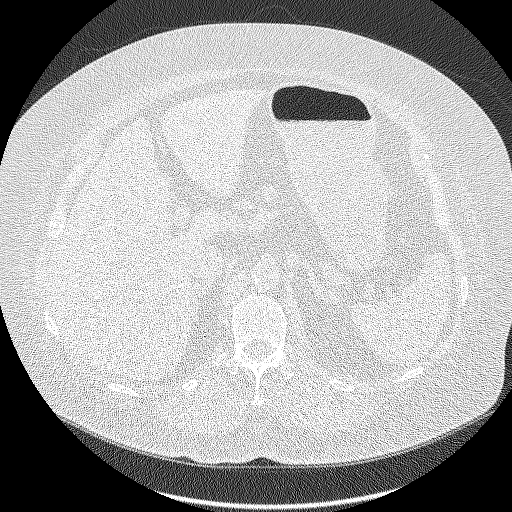
[im 72/357  lung]
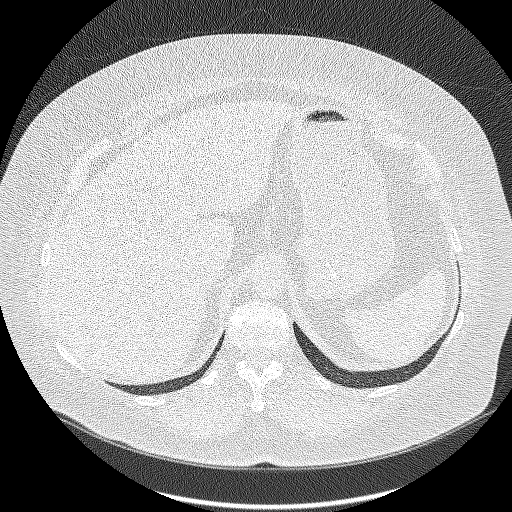
[im 107/357  lung]
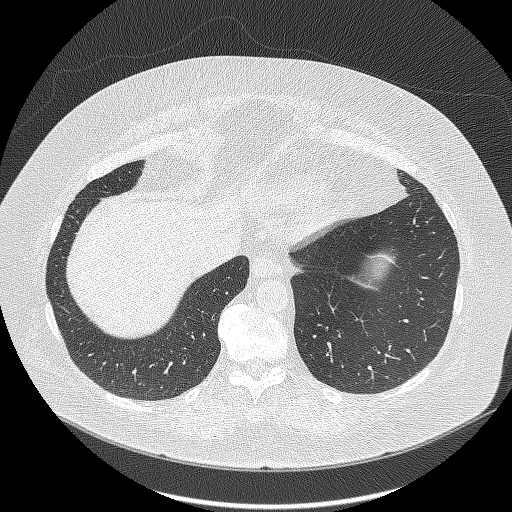
[im 143/357  mediastinal]
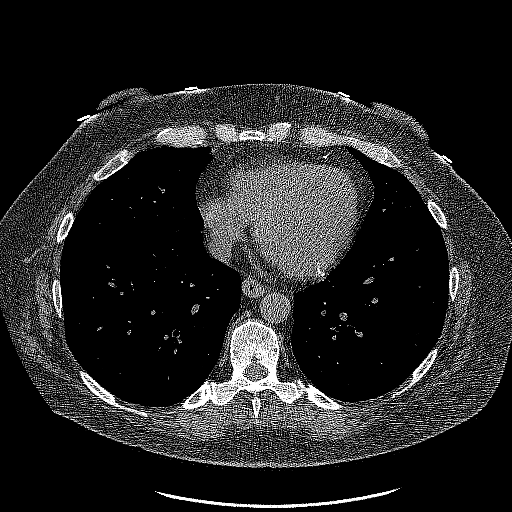
[im 143/357  lung]
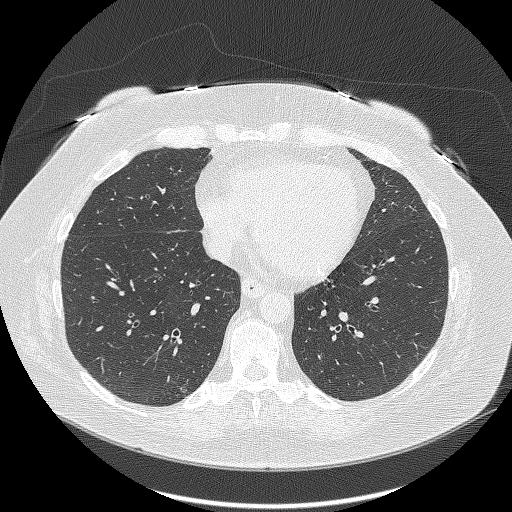
[im 161/357  lung]
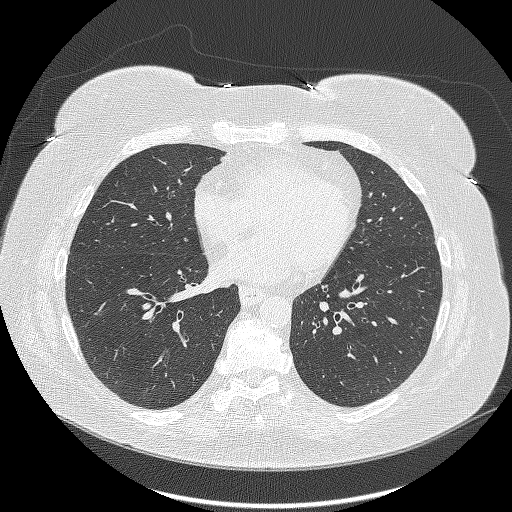
[im 196/357  lung]
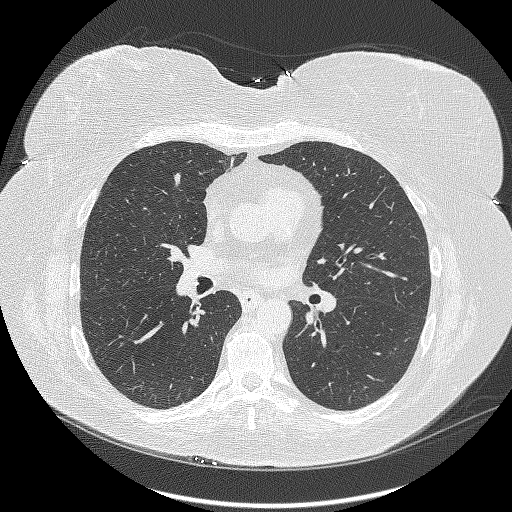
[im 214/357  lung]
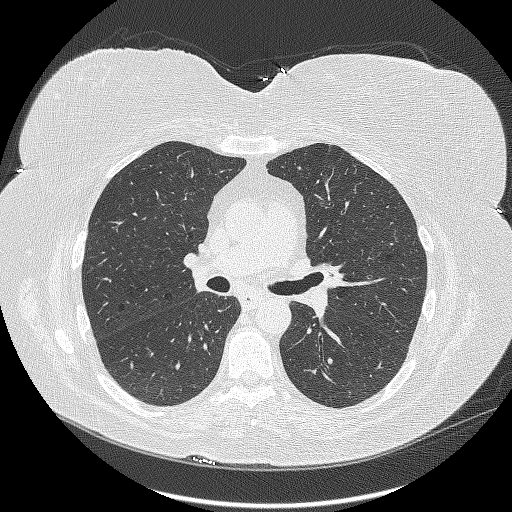
[im 250/357  mediastinal]
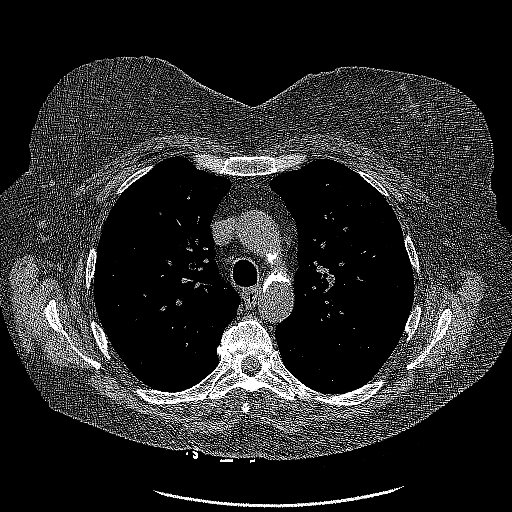
[im 250/357  lung]
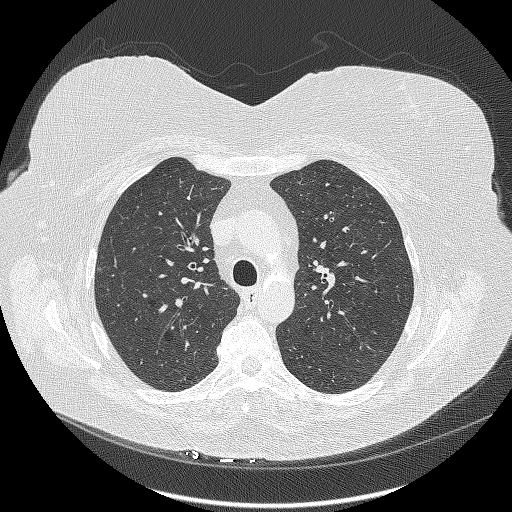
[im 285/357  lung]
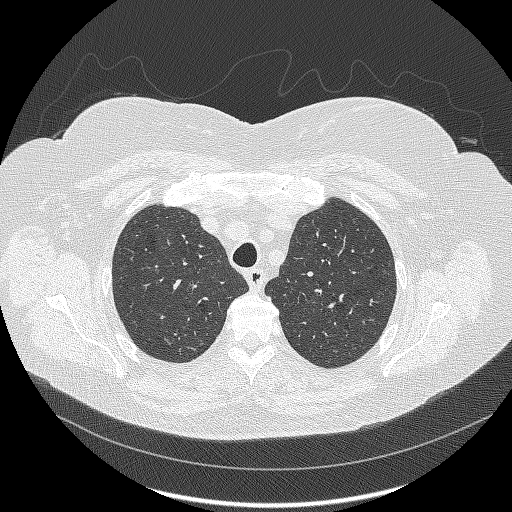
[im 303/357  lung]
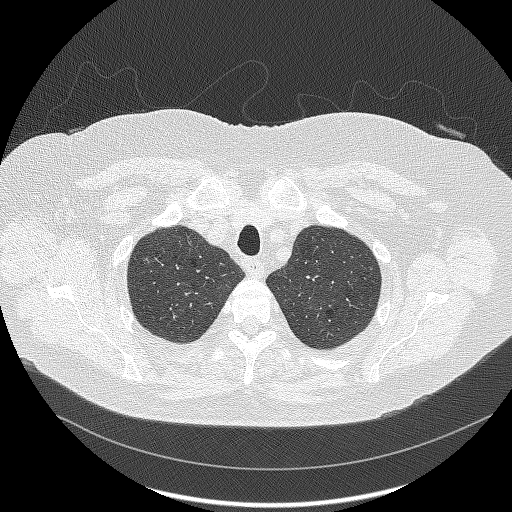
[im 339/357  lung]
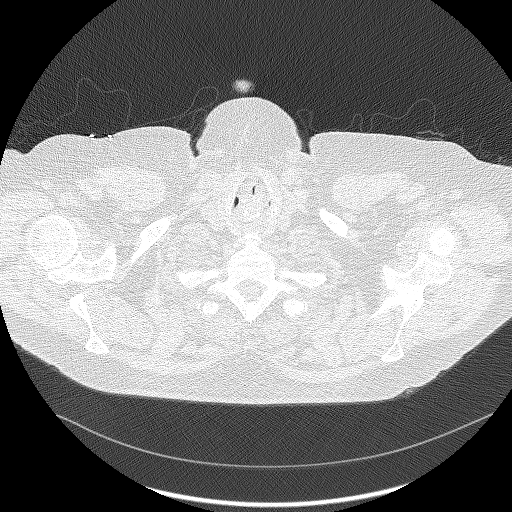

[Series 4: lung · coronal · 0.70mm/px · 3 of 338 slices shown (2 of 2)]
[im 68/338  lung]
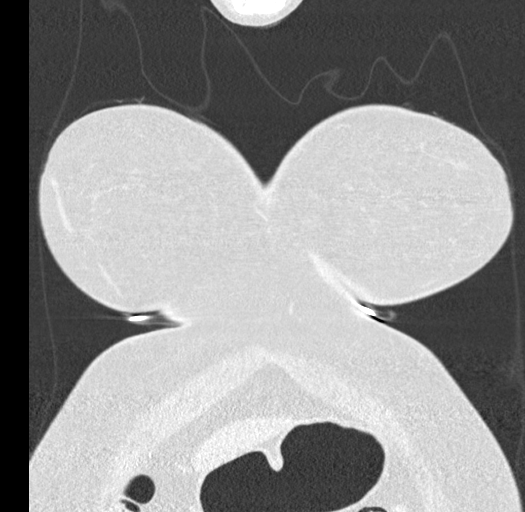
[im 135/338  lung]
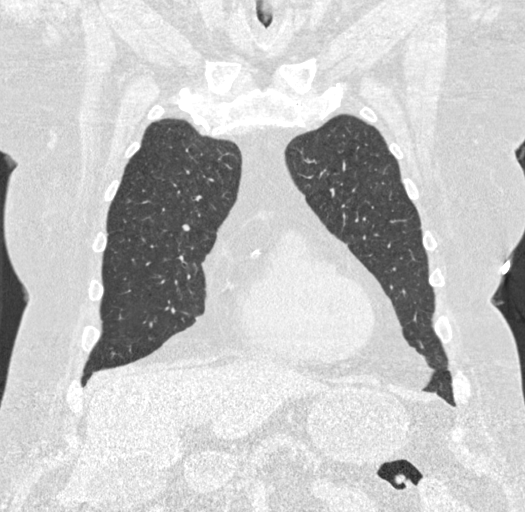
[im 203/338  lung]
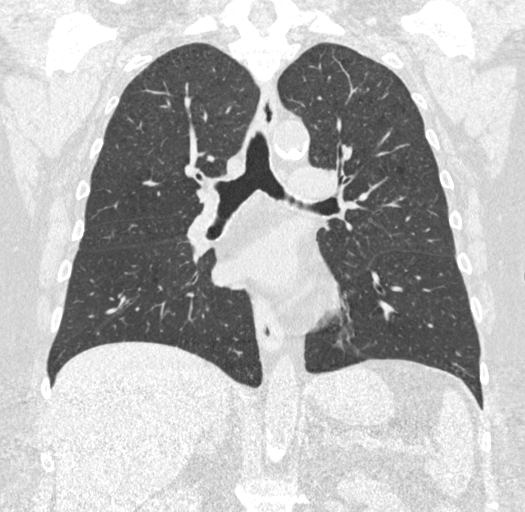

[15 of 40 positions shown; findings below may reference images not displayed]

FINDINGS: Cardiovascular: Heart size is normal. There is no significant
pericardial fluid, thickening or pericardial calcification. There is
aortic atherosclerosis, as well as atherosclerosis of the great
vessels of the mediastinum and the coronary arteries, including
calcified atherosclerotic plaque in the left main, left anterior
descending, left circumflex and right coronary arteries.

Mediastinum/Nodes: No pathologically enlarged mediastinal or hilar
lymph nodes. Please note that accurate exclusion of hilar adenopathy
is limited on noncontrast CT scans. Esophagus is unremarkable in
appearance. No axillary lymphadenopathy.

Lungs/Pleura: Again noted are multiple small pulmonary nodules
scattered throughout the lungs bilaterally, largest of which is in
the left upper lobe (axial image 62 of series 3) where there is a
non solid nodule with a volume derived mean diameter of 6.2 mm. No
larger more suspicious appearing pulmonary nodules or masses are
noted. No acute consolidative airspace disease. No pleural
effusions. Mild diffuse bronchial wall thickening with mild
centrilobular and paraseptal emphysema.

Upper Abdomen: Aortic atherosclerosis.

Musculoskeletal: There are no aggressive appearing lytic or blastic
lesions noted in the visualized portions of the skeleton.
IMPRESSION: 1. Lung-RADS 2S, benign appearance or behavior. Continue annual
screening with low-dose chest CT without contrast in 12 months.
2. The "S" modifier above refers to potentially clinically
significant non lung cancer related findings. Specifically, there is
aortic atherosclerosis, in addition to left main and 3 vessel
coronary artery disease. Please note that although the presence of
coronary artery calcium documents the presence of coronary artery
disease, the severity of this disease and any potential stenosis
cannot be assessed on this non-gated CT examination. Assessment for
potential risk factor modification, dietary therapy or pharmacologic
therapy may be warranted, if clinically indicated.
3. Mild diffuse bronchial wall thickening with mild centrilobular
and paraseptal emphysema; imaging findings suggestive of underlying
COPD.

Aortic Atherosclerosis (8QXK8-AYZ.Z) and Emphysema (8QXK8-NAH.C).

## 2019-12-06 DIAGNOSIS — E1122 Type 2 diabetes mellitus with diabetic chronic kidney disease: Secondary | ICD-10-CM | POA: Diagnosis not present

## 2019-12-06 DIAGNOSIS — N1832 Chronic kidney disease, stage 3b: Secondary | ICD-10-CM | POA: Diagnosis not present

## 2019-12-13 DIAGNOSIS — I10 Essential (primary) hypertension: Secondary | ICD-10-CM | POA: Diagnosis not present

## 2019-12-13 DIAGNOSIS — N2581 Secondary hyperparathyroidism of renal origin: Secondary | ICD-10-CM | POA: Diagnosis not present

## 2019-12-13 DIAGNOSIS — R600 Localized edema: Secondary | ICD-10-CM | POA: Diagnosis not present

## 2019-12-13 DIAGNOSIS — E1122 Type 2 diabetes mellitus with diabetic chronic kidney disease: Secondary | ICD-10-CM | POA: Diagnosis not present

## 2019-12-13 DIAGNOSIS — N1832 Chronic kidney disease, stage 3b: Secondary | ICD-10-CM | POA: Diagnosis not present

## 2019-12-13 DIAGNOSIS — D631 Anemia in chronic kidney disease: Secondary | ICD-10-CM | POA: Diagnosis not present

## 2020-01-11 DIAGNOSIS — G4733 Obstructive sleep apnea (adult) (pediatric): Secondary | ICD-10-CM | POA: Diagnosis not present

## 2020-02-08 DIAGNOSIS — H353132 Nonexudative age-related macular degeneration, bilateral, intermediate dry stage: Secondary | ICD-10-CM | POA: Diagnosis not present

## 2020-02-14 ENCOUNTER — Ambulatory Visit: Payer: PPO | Attending: Critical Care Medicine

## 2020-02-14 ENCOUNTER — Ambulatory Visit: Payer: PPO

## 2020-02-14 DIAGNOSIS — Z23 Encounter for immunization: Secondary | ICD-10-CM

## 2020-02-14 NOTE — Progress Notes (Signed)
   Covid-19 Vaccination Clinic  Name:  Mckenzie Lawrence    MRN: [id number] DOB: Mar 04, 1946  02/14/2020  [name] was observed post Covid-19 immunization for 30 minutes based on pre-vaccination screening without incident. She was provided with Vaccine Information Sheet and instruction to access the V-Safe system.   [name] was instructed to call 911 with any severe reactions post vaccine: Marland Kitchen Difficulty breathing  . Swelling of face and throat  . A fast heartbeat  . A bad rash all over body  . Dizziness and weakness

## 2020-02-15 DIAGNOSIS — H353221 Exudative age-related macular degeneration, left eye, with active choroidal neovascularization: Secondary | ICD-10-CM | POA: Diagnosis not present

## 2020-02-19 ENCOUNTER — Telehealth: Payer: Self-pay | Admitting: *Deleted

## 2020-02-19 NOTE — Telephone Encounter (Signed)
Pt notified that lung cancer screening imaging is due currently or in the near future. She currently smokes a pack and half per day. Appointment scheduled for 03/16/2020 at 2:00pm.

## 2020-02-29 ENCOUNTER — Other Ambulatory Visit: Payer: Self-pay | Admitting: *Deleted

## 2020-02-29 DIAGNOSIS — Z87891 Personal history of nicotine dependence: Secondary | ICD-10-CM

## 2020-02-29 DIAGNOSIS — Z122 Encounter for screening for malignant neoplasm of respiratory organs: Secondary | ICD-10-CM

## 2020-02-29 NOTE — Progress Notes (Signed)
Current smoker, 109.5 pack year

## 2020-03-09 DIAGNOSIS — H353211 Exudative age-related macular degeneration, right eye, with active choroidal neovascularization: Secondary | ICD-10-CM | POA: Diagnosis not present

## 2020-03-09 DIAGNOSIS — H353221 Exudative age-related macular degeneration, left eye, with active choroidal neovascularization: Secondary | ICD-10-CM | POA: Diagnosis not present

## 2020-03-16 ENCOUNTER — Ambulatory Visit: Admission: RE | Admit: 2020-03-16 | Payer: PPO | Source: Ambulatory Visit

## 2020-03-16 DIAGNOSIS — E785 Hyperlipidemia, unspecified: Secondary | ICD-10-CM | POA: Diagnosis not present

## 2020-03-16 DIAGNOSIS — I10 Essential (primary) hypertension: Secondary | ICD-10-CM | POA: Diagnosis not present

## 2020-03-16 DIAGNOSIS — Z720 Tobacco use: Secondary | ICD-10-CM | POA: Diagnosis not present

## 2020-03-16 DIAGNOSIS — Z23 Encounter for immunization: Secondary | ICD-10-CM | POA: Diagnosis not present

## 2020-03-16 DIAGNOSIS — R609 Edema, unspecified: Secondary | ICD-10-CM | POA: Diagnosis not present

## 2020-03-16 DIAGNOSIS — I2510 Atherosclerotic heart disease of native coronary artery without angina pectoris: Secondary | ICD-10-CM | POA: Diagnosis not present

## 2020-03-17 ENCOUNTER — Ambulatory Visit
Admission: RE | Admit: 2020-03-17 | Discharge: 2020-03-17 | Disposition: A | Discharge status: Home / Self Care | Payer: PPO | Source: Ambulatory Visit | Attending: Nurse Practitioner | Admitting: Nurse Practitioner

## 2020-03-17 ENCOUNTER — Other Ambulatory Visit: Payer: Self-pay

## 2020-03-17 DIAGNOSIS — Z87891 Personal history of nicotine dependence: Secondary | ICD-10-CM | POA: Diagnosis not present

## 2020-03-17 DIAGNOSIS — Z122 Encounter for screening for malignant neoplasm of respiratory organs: Secondary | ICD-10-CM | POA: Diagnosis not present

## 2020-03-17 DIAGNOSIS — F17210 Nicotine dependence, cigarettes, uncomplicated: Secondary | ICD-10-CM | POA: Diagnosis not present

## 2020-03-20 ENCOUNTER — Encounter: Payer: Self-pay | Admitting: *Deleted

## 2020-03-20 DIAGNOSIS — Z1231 Encounter for screening mammogram for malignant neoplasm of breast: Secondary | ICD-10-CM | POA: Diagnosis not present

## 2020-03-28 DIAGNOSIS — H353221 Exudative age-related macular degeneration, left eye, with active choroidal neovascularization: Secondary | ICD-10-CM | POA: Diagnosis not present

## 2020-03-29 DIAGNOSIS — I6381 Other cerebral infarction due to occlusion or stenosis of small artery: Secondary | ICD-10-CM | POA: Diagnosis not present

## 2020-03-29 DIAGNOSIS — Z1231 Encounter for screening mammogram for malignant neoplasm of breast: Secondary | ICD-10-CM | POA: Diagnosis not present

## 2020-03-29 DIAGNOSIS — G4733 Obstructive sleep apnea (adult) (pediatric): Secondary | ICD-10-CM | POA: Diagnosis not present

## 2020-04-11 DIAGNOSIS — H353211 Exudative age-related macular degeneration, right eye, with active choroidal neovascularization: Secondary | ICD-10-CM | POA: Diagnosis not present

## 2020-04-20 DIAGNOSIS — Z1152 Encounter for screening for COVID-19: Secondary | ICD-10-CM | POA: Diagnosis not present

## 2020-04-20 DIAGNOSIS — Z03818 Encounter for observation for suspected exposure to other biological agents ruled out: Secondary | ICD-10-CM | POA: Diagnosis not present

## 2020-05-16 DIAGNOSIS — H353221 Exudative age-related macular degeneration, left eye, with active choroidal neovascularization: Secondary | ICD-10-CM | POA: Diagnosis not present

## 2020-05-23 DIAGNOSIS — H353211 Exudative age-related macular degeneration, right eye, with active choroidal neovascularization: Secondary | ICD-10-CM | POA: Diagnosis not present

## 2020-06-29 DIAGNOSIS — E1151 Type 2 diabetes mellitus with diabetic peripheral angiopathy without gangrene: Secondary | ICD-10-CM | POA: Diagnosis not present

## 2020-06-29 DIAGNOSIS — I10 Essential (primary) hypertension: Secondary | ICD-10-CM | POA: Diagnosis not present

## 2020-07-06 DIAGNOSIS — Z0000 Encounter for general adult medical examination without abnormal findings: Secondary | ICD-10-CM | POA: Diagnosis not present

## 2020-07-06 DIAGNOSIS — Z720 Tobacco use: Secondary | ICD-10-CM | POA: Diagnosis not present

## 2020-07-06 DIAGNOSIS — Z794 Long term (current) use of insulin: Secondary | ICD-10-CM | POA: Diagnosis not present

## 2020-07-06 DIAGNOSIS — E1122 Type 2 diabetes mellitus with diabetic chronic kidney disease: Secondary | ICD-10-CM | POA: Diagnosis not present

## 2020-07-06 DIAGNOSIS — I10 Essential (primary) hypertension: Secondary | ICD-10-CM | POA: Diagnosis not present

## 2020-07-11 DIAGNOSIS — H353221 Exudative age-related macular degeneration, left eye, with active choroidal neovascularization: Secondary | ICD-10-CM | POA: Diagnosis not present

## 2020-07-13 DIAGNOSIS — R8271 Bacteriuria: Secondary | ICD-10-CM | POA: Diagnosis not present

## 2020-07-13 DIAGNOSIS — N3020 Other chronic cystitis without hematuria: Secondary | ICD-10-CM | POA: Diagnosis not present

## 2020-07-17 DIAGNOSIS — H353211 Exudative age-related macular degeneration, right eye, with active choroidal neovascularization: Secondary | ICD-10-CM | POA: Diagnosis not present

## 2020-07-18 DIAGNOSIS — S0501XA Injury of conjunctiva and corneal abrasion without foreign body, right eye, initial encounter: Secondary | ICD-10-CM | POA: Diagnosis not present

## 2020-07-25 DIAGNOSIS — N1832 Chronic kidney disease, stage 3b: Secondary | ICD-10-CM | POA: Diagnosis not present

## 2020-07-25 DIAGNOSIS — E1122 Type 2 diabetes mellitus with diabetic chronic kidney disease: Secondary | ICD-10-CM | POA: Diagnosis not present

## 2020-07-31 DIAGNOSIS — E1122 Type 2 diabetes mellitus with diabetic chronic kidney disease: Secondary | ICD-10-CM | POA: Diagnosis not present

## 2020-07-31 DIAGNOSIS — R600 Localized edema: Secondary | ICD-10-CM | POA: Diagnosis not present

## 2020-07-31 DIAGNOSIS — N184 Chronic kidney disease, stage 4 (severe): Secondary | ICD-10-CM | POA: Diagnosis not present

## 2020-07-31 DIAGNOSIS — D631 Anemia in chronic kidney disease: Secondary | ICD-10-CM | POA: Diagnosis not present

## 2020-07-31 DIAGNOSIS — I10 Essential (primary) hypertension: Secondary | ICD-10-CM | POA: Diagnosis not present

## 2020-07-31 DIAGNOSIS — N2581 Secondary hyperparathyroidism of renal origin: Secondary | ICD-10-CM | POA: Diagnosis not present

## 2020-08-04 DIAGNOSIS — N183 Chronic kidney disease, stage 3 unspecified: Secondary | ICD-10-CM | POA: Diagnosis not present

## 2020-08-23 ENCOUNTER — Other Ambulatory Visit: Payer: Self-pay

## 2020-08-23 ENCOUNTER — Ambulatory Visit (INDEPENDENT_AMBULATORY_CARE_PROVIDER_SITE_OTHER): Payer: PPO | Admitting: Podiatry

## 2020-08-23 ENCOUNTER — Encounter: Payer: Self-pay | Admitting: Podiatry

## 2020-08-23 DIAGNOSIS — B351 Tinea unguium: Secondary | ICD-10-CM | POA: Diagnosis not present

## 2020-08-23 DIAGNOSIS — E1142 Type 2 diabetes mellitus with diabetic polyneuropathy: Secondary | ICD-10-CM | POA: Diagnosis not present

## 2020-08-23 DIAGNOSIS — M79676 Pain in unspecified toe(s): Secondary | ICD-10-CM | POA: Diagnosis not present

## 2020-08-23 LAB — COLOGUARD: COLOGUARD: NEGATIVE

## 2020-08-23 NOTE — Progress Notes (Signed)
She presents today chief complaint of painfully elongated toenails.  Objective: Toenails are long thick yellow dystrophic clinically mycotic painful palpation.  Assessment: Pain in limb secondary to elongated incurvated painful thick toenails.  Plan: Debridement of toenails 1 through 5 bilateral.

## 2020-09-04 DIAGNOSIS — H353221 Exudative age-related macular degeneration, left eye, with active choroidal neovascularization: Secondary | ICD-10-CM | POA: Diagnosis not present

## 2020-09-05 DIAGNOSIS — S0501XA Injury of conjunctiva and corneal abrasion without foreign body, right eye, initial encounter: Secondary | ICD-10-CM | POA: Diagnosis not present

## 2020-09-08 DIAGNOSIS — Z20822 Contact with and (suspected) exposure to covid-19: Secondary | ICD-10-CM | POA: Diagnosis not present

## 2020-09-08 DIAGNOSIS — Z03818 Encounter for observation for suspected exposure to other biological agents ruled out: Secondary | ICD-10-CM | POA: Diagnosis not present

## 2020-09-25 DIAGNOSIS — H353221 Exudative age-related macular degeneration, left eye, with active choroidal neovascularization: Secondary | ICD-10-CM | POA: Diagnosis not present

## 2020-09-25 DIAGNOSIS — H353211 Exudative age-related macular degeneration, right eye, with active choroidal neovascularization: Secondary | ICD-10-CM | POA: Diagnosis not present

## 2020-10-05 DIAGNOSIS — I10 Essential (primary) hypertension: Secondary | ICD-10-CM | POA: Diagnosis not present

## 2020-10-05 DIAGNOSIS — I129 Hypertensive chronic kidney disease with stage 1 through stage 4 chronic kidney disease, or unspecified chronic kidney disease: Secondary | ICD-10-CM | POA: Diagnosis not present

## 2020-10-05 DIAGNOSIS — E162 Hypoglycemia, unspecified: Secondary | ICD-10-CM | POA: Diagnosis not present

## 2020-10-05 DIAGNOSIS — J449 Chronic obstructive pulmonary disease, unspecified: Secondary | ICD-10-CM | POA: Diagnosis not present

## 2020-10-05 DIAGNOSIS — N184 Chronic kidney disease, stage 4 (severe): Secondary | ICD-10-CM | POA: Diagnosis not present

## 2020-10-05 DIAGNOSIS — E1122 Type 2 diabetes mellitus with diabetic chronic kidney disease: Secondary | ICD-10-CM | POA: Diagnosis not present

## 2020-10-12 DIAGNOSIS — Z720 Tobacco use: Secondary | ICD-10-CM | POA: Diagnosis not present

## 2020-10-12 DIAGNOSIS — R002 Palpitations: Secondary | ICD-10-CM | POA: Diagnosis not present

## 2020-10-12 DIAGNOSIS — I2510 Atherosclerotic heart disease of native coronary artery without angina pectoris: Secondary | ICD-10-CM | POA: Diagnosis not present

## 2020-10-12 DIAGNOSIS — E785 Hyperlipidemia, unspecified: Secondary | ICD-10-CM | POA: Diagnosis not present

## 2020-10-12 DIAGNOSIS — R0602 Shortness of breath: Secondary | ICD-10-CM | POA: Diagnosis not present

## 2020-10-12 DIAGNOSIS — I10 Essential (primary) hypertension: Secondary | ICD-10-CM | POA: Diagnosis not present

## 2020-10-18 DIAGNOSIS — G4733 Obstructive sleep apnea (adult) (pediatric): Secondary | ICD-10-CM | POA: Diagnosis not present

## 2020-10-24 DIAGNOSIS — N184 Chronic kidney disease, stage 4 (severe): Secondary | ICD-10-CM | POA: Diagnosis not present

## 2020-10-24 DIAGNOSIS — E1122 Type 2 diabetes mellitus with diabetic chronic kidney disease: Secondary | ICD-10-CM | POA: Diagnosis not present

## 2020-10-31 DIAGNOSIS — N2581 Secondary hyperparathyroidism of renal origin: Secondary | ICD-10-CM | POA: Diagnosis not present

## 2020-10-31 DIAGNOSIS — I10 Essential (primary) hypertension: Secondary | ICD-10-CM | POA: Diagnosis not present

## 2020-10-31 DIAGNOSIS — N184 Chronic kidney disease, stage 4 (severe): Secondary | ICD-10-CM | POA: Diagnosis not present

## 2020-10-31 DIAGNOSIS — E1122 Type 2 diabetes mellitus with diabetic chronic kidney disease: Secondary | ICD-10-CM | POA: Diagnosis not present

## 2020-10-31 DIAGNOSIS — D631 Anemia in chronic kidney disease: Secondary | ICD-10-CM | POA: Diagnosis not present

## 2020-11-12 IMAGING — US US RENAL
1 series · 14 of 25 positions shown · non-contrast
Comparison: Abdomen and pelvis CT dated 03/13/2007.

CLINICAL DATA: Stage 3 chronic kidney disease.

EXAM:
RENAL / URINARY TRACT ULTRASOUND COMPLETE

[Series 1: us renal · 0.26mm/px · 14 of 34 slices shown]
[im 1/34]
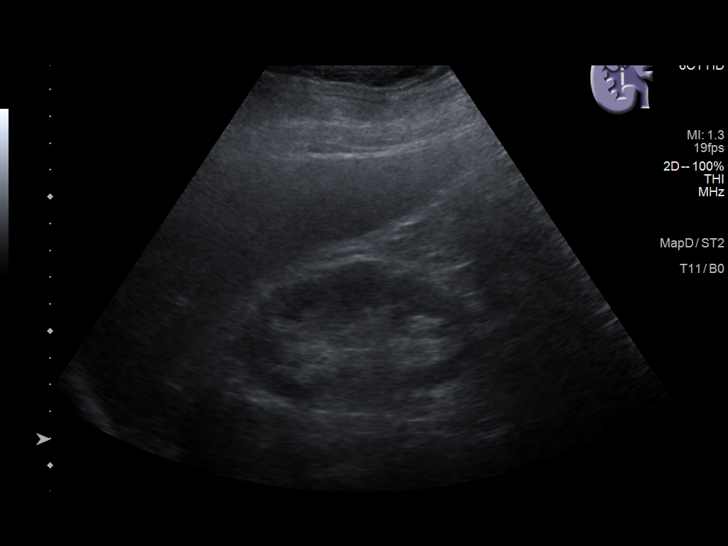
[im 3/34]
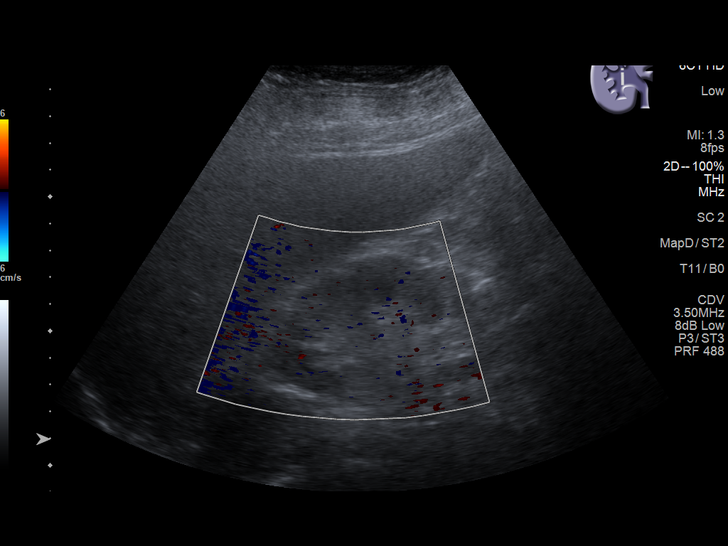
[im 6/34]
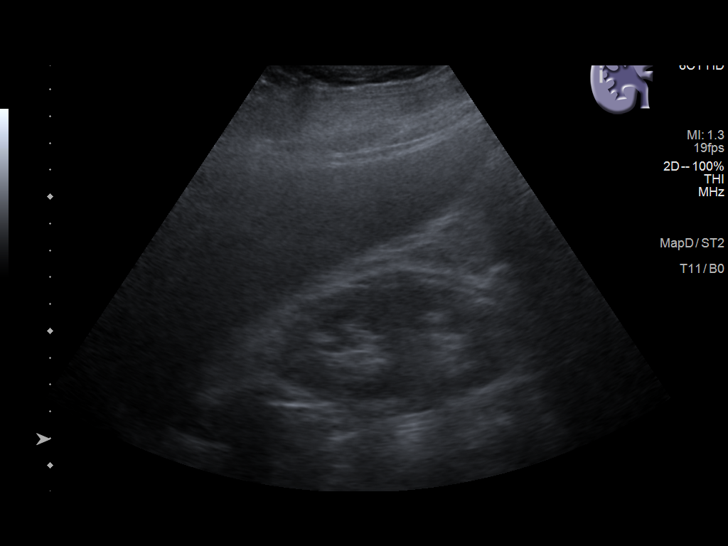
[im 9/34]
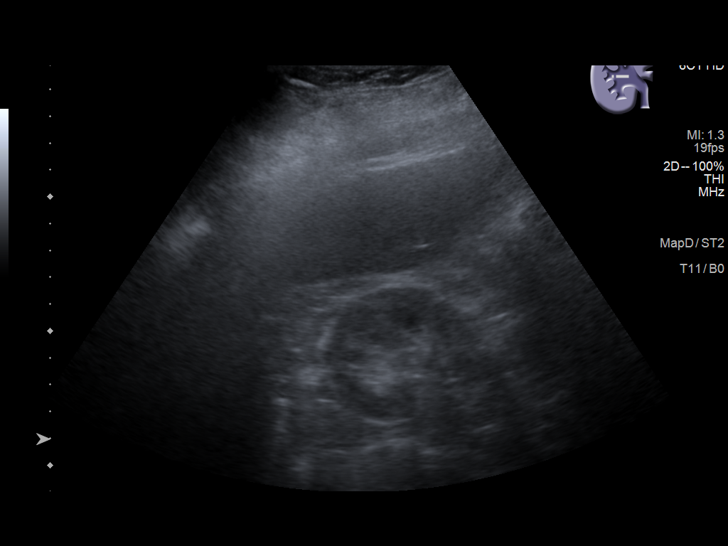
[im 12/34]
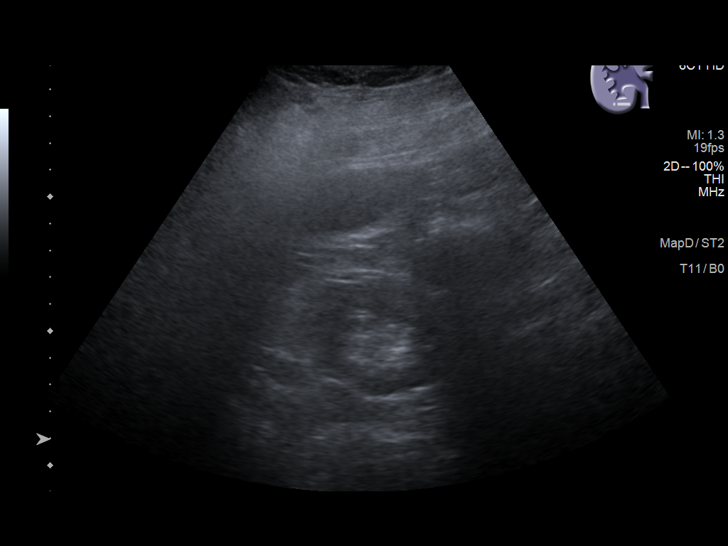
[im 13/34]
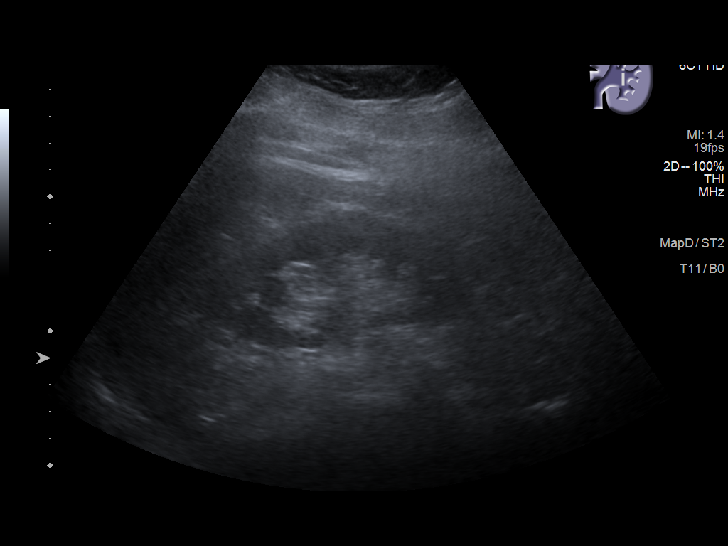
[im 16/34]
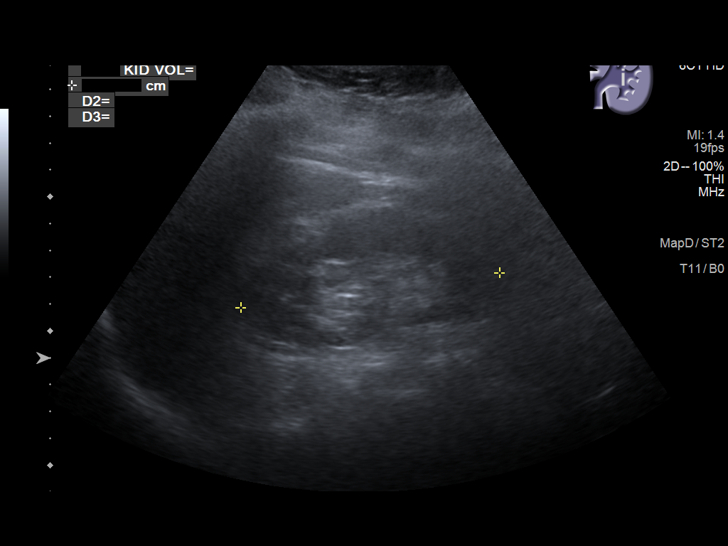
[im 18/34]
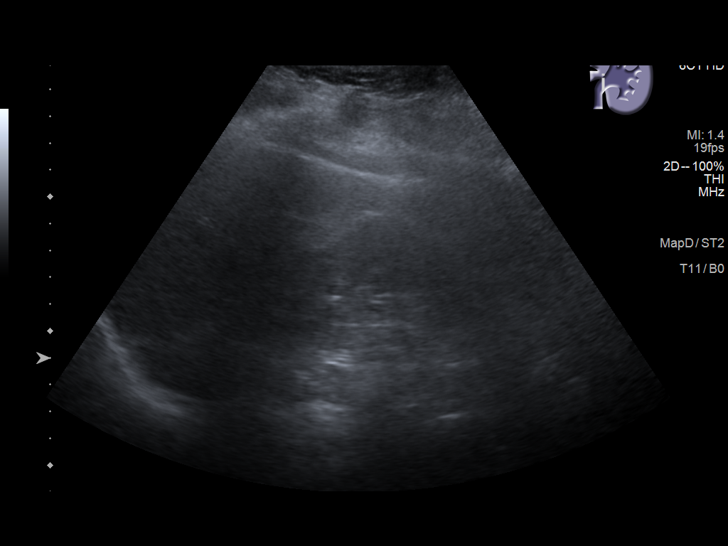
[im 21/34]
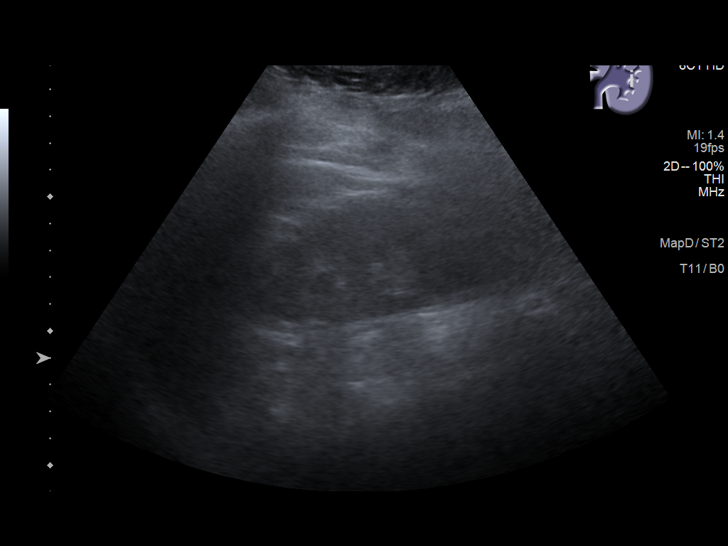
[im 23/34]
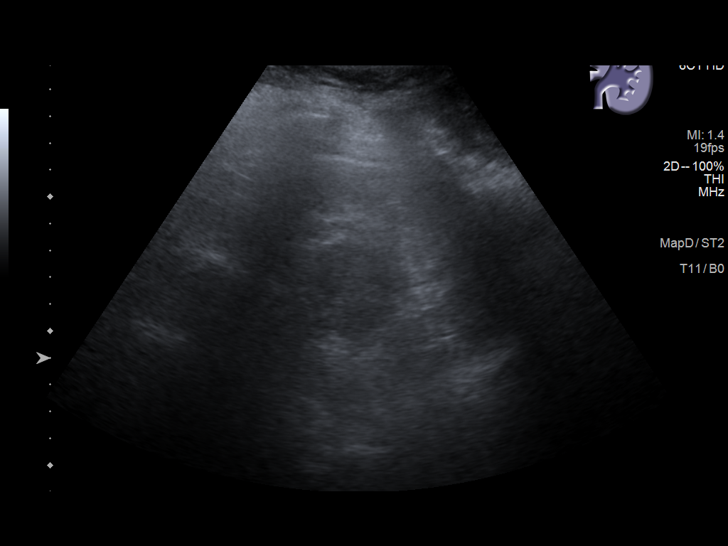
[im 25/34]
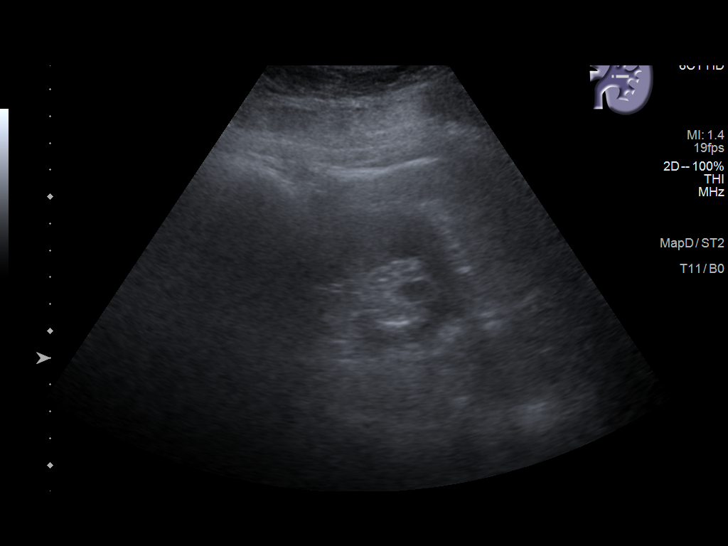
[im 28/34]
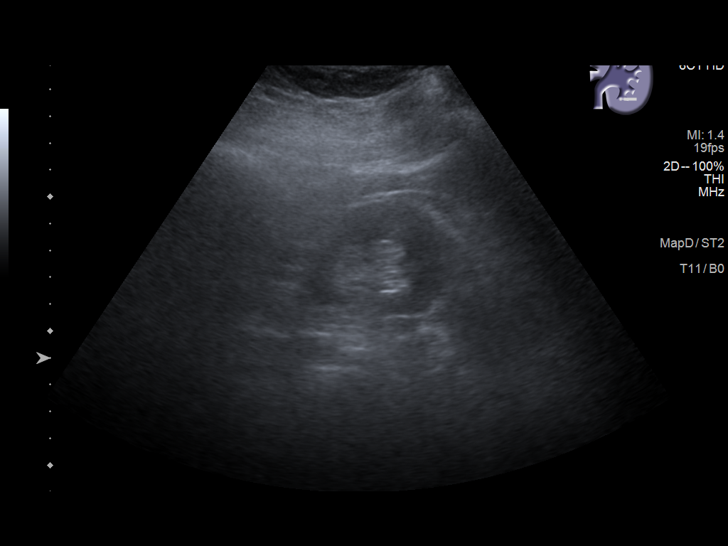
[im 31/34]
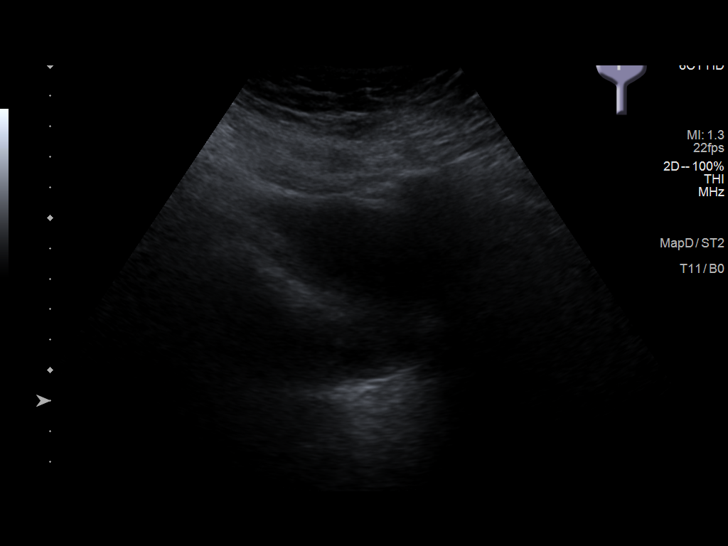
[im 34/34]
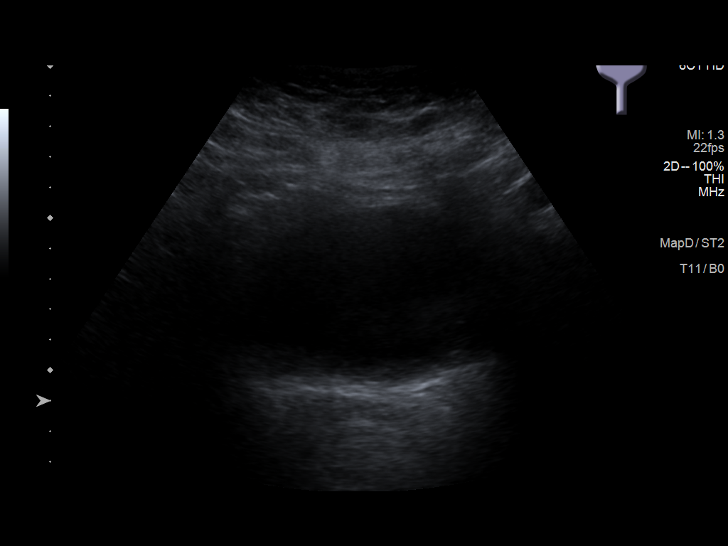

[14 of 25 positions shown; findings below may reference images not displayed]

FINDINGS: Right Kidney:

Renal measurements: 8.9 x 5.6 x 5.4 cm = volume: 140 mL .
Echogenicity within normal limits. No mass or hydronephrosis
visualized.

Left Kidney:

Renal measurements: 9.7 x 5.3 x 4.6 cm = volume: 125 mL.
Echogenicity within normal limits. No mass or hydronephrosis
visualized.

Bladder:

Appears normal for degree of bladder distention.
IMPRESSION: Normal examination.

## 2020-11-14 DIAGNOSIS — H353221 Exudative age-related macular degeneration, left eye, with active choroidal neovascularization: Secondary | ICD-10-CM | POA: Diagnosis not present

## 2020-11-24 DIAGNOSIS — G4733 Obstructive sleep apnea (adult) (pediatric): Secondary | ICD-10-CM | POA: Diagnosis not present

## 2020-12-04 ENCOUNTER — Other Ambulatory Visit: Payer: Self-pay

## 2020-12-04 ENCOUNTER — Ambulatory Visit (INDEPENDENT_AMBULATORY_CARE_PROVIDER_SITE_OTHER): Payer: PPO | Admitting: Podiatry

## 2020-12-04 ENCOUNTER — Encounter: Payer: Self-pay | Admitting: Podiatry

## 2020-12-04 DIAGNOSIS — B351 Tinea unguium: Secondary | ICD-10-CM

## 2020-12-04 DIAGNOSIS — M79676 Pain in unspecified toe(s): Secondary | ICD-10-CM

## 2020-12-04 DIAGNOSIS — E1142 Type 2 diabetes mellitus with diabetic polyneuropathy: Secondary | ICD-10-CM | POA: Diagnosis not present

## 2020-12-04 NOTE — Progress Notes (Signed)
She presents today chief complaint of painful elongated toenails 1 through 5 bilaterally.  Objective: Pulses remain palpable capillary fill time is somewhat sluggish.  Feet are warm to the touch no open lesions or wounds are noted.  Toenails are long thick yellow dystrophic onychomycotic sharply incurvated.  Assessment: Pain in limb secondary to onychomycosis.  Plan: Debridement of toenails.

## 2020-12-19 DIAGNOSIS — H353211 Exudative age-related macular degeneration, right eye, with active choroidal neovascularization: Secondary | ICD-10-CM | POA: Diagnosis not present

## 2021-01-08 ENCOUNTER — Other Ambulatory Visit: Payer: Self-pay | Admitting: *Deleted

## 2021-01-08 DIAGNOSIS — Z87891 Personal history of nicotine dependence: Secondary | ICD-10-CM

## 2021-01-08 DIAGNOSIS — F17210 Nicotine dependence, cigarettes, uncomplicated: Secondary | ICD-10-CM

## 2021-01-09 DIAGNOSIS — M48062 Spinal stenosis, lumbar region with neurogenic claudication: Secondary | ICD-10-CM | POA: Diagnosis not present

## 2021-01-09 DIAGNOSIS — M4807 Spinal stenosis, lumbosacral region: Secondary | ICD-10-CM | POA: Diagnosis not present

## 2021-01-10 ENCOUNTER — Other Ambulatory Visit: Payer: Self-pay | Admitting: Neurosurgery

## 2021-01-10 ENCOUNTER — Other Ambulatory Visit: Payer: Self-pay | Admitting: Obstetrics & Gynecology

## 2021-01-10 DIAGNOSIS — M4807 Spinal stenosis, lumbosacral region: Secondary | ICD-10-CM

## 2021-01-11 DIAGNOSIS — F419 Anxiety disorder, unspecified: Secondary | ICD-10-CM | POA: Diagnosis not present

## 2021-01-11 DIAGNOSIS — E1142 Type 2 diabetes mellitus with diabetic polyneuropathy: Secondary | ICD-10-CM | POA: Diagnosis not present

## 2021-01-11 DIAGNOSIS — M48062 Spinal stenosis, lumbar region with neurogenic claudication: Secondary | ICD-10-CM | POA: Diagnosis not present

## 2021-01-11 DIAGNOSIS — I10 Essential (primary) hypertension: Secondary | ICD-10-CM | POA: Diagnosis not present

## 2021-01-11 DIAGNOSIS — J449 Chronic obstructive pulmonary disease, unspecified: Secondary | ICD-10-CM | POA: Diagnosis not present

## 2021-01-11 DIAGNOSIS — N184 Chronic kidney disease, stage 4 (severe): Secondary | ICD-10-CM | POA: Diagnosis not present

## 2021-01-11 DIAGNOSIS — E1151 Type 2 diabetes mellitus with diabetic peripheral angiopathy without gangrene: Secondary | ICD-10-CM | POA: Diagnosis not present

## 2021-01-13 ENCOUNTER — Ambulatory Visit
Admission: RE | Admit: 2021-01-13 | Discharge: 2021-01-13 | Disposition: A | Discharge status: Home / Self Care | Payer: Self-pay | Source: Ambulatory Visit | Attending: Neurosurgery | Admitting: Neurosurgery

## 2021-01-13 ENCOUNTER — Other Ambulatory Visit: Payer: Self-pay

## 2021-01-13 DIAGNOSIS — M545 Low back pain, unspecified: Secondary | ICD-10-CM | POA: Diagnosis not present

## 2021-01-13 DIAGNOSIS — M48061 Spinal stenosis, lumbar region without neurogenic claudication: Secondary | ICD-10-CM | POA: Diagnosis not present

## 2021-01-13 DIAGNOSIS — M4807 Spinal stenosis, lumbosacral region: Secondary | ICD-10-CM

## 2021-01-16 ENCOUNTER — Other Ambulatory Visit: Payer: Self-pay

## 2021-01-18 DIAGNOSIS — I2510 Atherosclerotic heart disease of native coronary artery without angina pectoris: Secondary | ICD-10-CM | POA: Diagnosis not present

## 2021-01-18 DIAGNOSIS — R609 Edema, unspecified: Secondary | ICD-10-CM | POA: Diagnosis not present

## 2021-01-18 DIAGNOSIS — I10 Essential (primary) hypertension: Secondary | ICD-10-CM | POA: Diagnosis not present

## 2021-01-24 ENCOUNTER — Other Ambulatory Visit: Payer: Self-pay | Admitting: Neurosurgery

## 2021-01-24 ENCOUNTER — Other Ambulatory Visit: Payer: Self-pay | Admitting: Cardiology

## 2021-01-24 DIAGNOSIS — M5416 Radiculopathy, lumbar region: Secondary | ICD-10-CM

## 2021-01-25 ENCOUNTER — Telehealth: Payer: Self-pay

## 2021-01-25 MED ORDER — PREDNISONE 50 MG PO TABS: ORAL_TABLET | ORAL | 0 refills | Status: DC

## 2021-01-25 NOTE — Telephone Encounter (Signed)
Phone call to patient to review instructions for 13 hr prep for epidural injection w/contrast on 01/30/21  at 11:00AM. Prescription called into [facility]. Pt aware and verbalized understanding of instructions.   Prescription: Pt to take 50 mg of prednisone on 8/15 at 10:00PM, 50 mg of prednisone on 8/16 at 04:00AM, and 50 mg of prednisone on 8/16 at 10:00AM. Pt is also to take 50 mg of benadryl on 8/16 at 10:00AM. Please call [phone] with any questions.   Pt reports she has Benadryl at home and does not wish this to be called in as a prescription.

## 2021-01-30 ENCOUNTER — Ambulatory Visit
Admission: RE | Admit: 2021-01-30 | Discharge: 2021-01-30 | Disposition: A | Discharge status: Home / Self Care | Payer: Self-pay | Source: Ambulatory Visit | Attending: Neurosurgery | Admitting: Neurosurgery

## 2021-01-30 DIAGNOSIS — M5136 Other intervertebral disc degeneration, lumbar region: Secondary | ICD-10-CM | POA: Diagnosis not present

## 2021-01-30 DIAGNOSIS — M5416 Radiculopathy, lumbar region: Secondary | ICD-10-CM

## 2021-01-30 DIAGNOSIS — M47817 Spondylosis without myelopathy or radiculopathy, lumbosacral region: Secondary | ICD-10-CM | POA: Diagnosis not present

## 2021-01-30 MED ORDER — IOPAMIDOL (ISOVUE-M 200) INJECTION 41%
1.0000 mL | Freq: Once | INTRAMUSCULAR | Status: AC
  Administered 2021-01-30: 1 mL via EPIDURAL

## 2021-01-30 MED ORDER — DIAZEPAM 5 MG PO TABS
5.0000 mg | ORAL_TABLET | Freq: Once | ORAL | Status: AC
  Administered 2021-01-30: 5 mg via ORAL

## 2021-01-30 MED ORDER — METHYLPREDNISOLONE ACETATE 40 MG/ML INJ SUSP (RADIOLOG
80.0000 mg | Freq: Once | INTRAMUSCULAR | Status: AC
  Administered 2021-01-30: 80 mg via EPIDURAL

## 2021-01-30 NOTE — Discharge Instructions (Signed)
Post Procedure Spinal Discharge Instruction Sheet  You may resume a regular diet and any medications that you routinely take (including pain medications) unless otherwise noted by MD.  No driving day of procedure.  Light activity throughout the rest of the day.  Do not do any strenuous work, exercise, bending or lifting.  The day following the procedure, you can resume normal physical activity but you should refrain from exercising or physical therapy for at least three days thereafter.  You may apply ice to the injection site, 20 minutes on, 20 minutes off, as needed. Do not apply ice directly to skin.    Common Side Effects:  Headaches- take your usual medications as directed by your physician.  Increase your fluid intake.  Caffeinated beverages may be helpful.  Lie flat in bed until your headache resolves.  Restlessness or inability to sleep- you may have trouble sleeping for the next few days.  Ask your referring physician if you need any medication for sleep.  Facial flushing or redness- should subside within a few days.  Increased pain- a temporary increase in pain a day or two following your procedure is not unusual.  Take your pain medication as prescribed by your referring physician.  Leg cramps  Please contact our office at [phone] for the following symptoms: Fever greater than 100 degrees. Headaches unresolved with medication after 2-3 days. Increased swelling, pain, or redness at injection site.   Thank you for visiting [facility] today.    YOU MAY RESUME YOUR PLAVIX ANYTIME AFTER INJECTION TODAY

## 2021-02-05 DIAGNOSIS — N184 Chronic kidney disease, stage 4 (severe): Secondary | ICD-10-CM | POA: Diagnosis not present

## 2021-02-05 DIAGNOSIS — H353211 Exudative age-related macular degeneration, right eye, with active choroidal neovascularization: Secondary | ICD-10-CM | POA: Diagnosis not present

## 2021-02-05 DIAGNOSIS — H353221 Exudative age-related macular degeneration, left eye, with active choroidal neovascularization: Secondary | ICD-10-CM | POA: Diagnosis not present

## 2021-02-05 DIAGNOSIS — E1122 Type 2 diabetes mellitus with diabetic chronic kidney disease: Secondary | ICD-10-CM | POA: Diagnosis not present

## 2021-02-08 DIAGNOSIS — D631 Anemia in chronic kidney disease: Secondary | ICD-10-CM | POA: Diagnosis not present

## 2021-02-08 DIAGNOSIS — N184 Chronic kidney disease, stage 4 (severe): Secondary | ICD-10-CM | POA: Diagnosis not present

## 2021-02-08 DIAGNOSIS — E1122 Type 2 diabetes mellitus with diabetic chronic kidney disease: Secondary | ICD-10-CM | POA: Diagnosis not present

## 2021-02-08 DIAGNOSIS — I10 Essential (primary) hypertension: Secondary | ICD-10-CM | POA: Diagnosis not present

## 2021-02-08 DIAGNOSIS — N2581 Secondary hyperparathyroidism of renal origin: Secondary | ICD-10-CM | POA: Diagnosis not present

## 2021-02-16 DIAGNOSIS — Z20822 Contact with and (suspected) exposure to covid-19: Secondary | ICD-10-CM | POA: Diagnosis not present

## 2021-02-16 DIAGNOSIS — Z03818 Encounter for observation for suspected exposure to other biological agents ruled out: Secondary | ICD-10-CM | POA: Diagnosis not present

## 2021-03-12 ENCOUNTER — Encounter: Payer: Self-pay | Admitting: Podiatry

## 2021-03-12 ENCOUNTER — Ambulatory Visit (INDEPENDENT_AMBULATORY_CARE_PROVIDER_SITE_OTHER): Payer: PPO | Admitting: Podiatry

## 2021-03-12 ENCOUNTER — Other Ambulatory Visit: Payer: Self-pay

## 2021-03-12 DIAGNOSIS — M79676 Pain in unspecified toe(s): Secondary | ICD-10-CM

## 2021-03-12 DIAGNOSIS — E1142 Type 2 diabetes mellitus with diabetic polyneuropathy: Secondary | ICD-10-CM

## 2021-03-12 DIAGNOSIS — B351 Tinea unguium: Secondary | ICD-10-CM | POA: Diagnosis not present

## 2021-03-12 NOTE — Progress Notes (Signed)
She presents today chief complaint of painful elongated toenails 1 through 5 bilaterally.  Objective pulses are strong and palpable neurologic sensorium is diminished per Semmes Weinstein monofilament to the level of the ankle.  Toenails are long thick yellow dystrophic-like mycotic no open lesions or wounds.  Assessment: Pain limb secondary to onychomycosis neuropathy.  Plan: Debrided nails 1 through 5 bilateral.

## 2021-03-19 ENCOUNTER — Ambulatory Visit
Admission: RE | Admit: 2021-03-19 | Discharge: 2021-03-19 | Disposition: A | Discharge status: Home / Self Care | Payer: HMO | Source: Ambulatory Visit | Attending: Acute Care | Admitting: Acute Care

## 2021-03-19 ENCOUNTER — Other Ambulatory Visit: Payer: Self-pay

## 2021-03-19 DIAGNOSIS — I10 Essential (primary) hypertension: Secondary | ICD-10-CM | POA: Diagnosis not present

## 2021-03-19 DIAGNOSIS — R002 Palpitations: Secondary | ICD-10-CM | POA: Diagnosis not present

## 2021-03-19 DIAGNOSIS — F17210 Nicotine dependence, cigarettes, uncomplicated: Secondary | ICD-10-CM

## 2021-03-19 DIAGNOSIS — Z87891 Personal history of nicotine dependence: Secondary | ICD-10-CM

## 2021-03-22 ENCOUNTER — Other Ambulatory Visit: Payer: Self-pay

## 2021-03-22 ENCOUNTER — Encounter: Payer: Self-pay | Admitting: Emergency Medicine

## 2021-03-22 ENCOUNTER — Ambulatory Visit
Admission: EM | Admit: 2021-03-22 | Discharge: 2021-03-22 | Disposition: A | Discharge status: Home / Self Care | Payer: HMO | Attending: Emergency Medicine | Admitting: Emergency Medicine

## 2021-03-22 ENCOUNTER — Telehealth: Payer: Self-pay | Admitting: Emergency Medicine

## 2021-03-22 DIAGNOSIS — R0602 Shortness of breath: Secondary | ICD-10-CM

## 2021-03-22 DIAGNOSIS — J22 Unspecified acute lower respiratory infection: Secondary | ICD-10-CM | POA: Diagnosis not present

## 2021-03-22 MED ORDER — ALBUTEROL SULFATE (5 MG/ML) 0.5% IN NEBU: 2.5000 mg | INHALATION_SOLUTION | Freq: Four times a day (QID) | RESPIRATORY_TRACT | 0 refills | Status: DC | PRN

## 2021-03-22 MED ORDER — DOXYCYCLINE HYCLATE 100 MG PO CAPS: 100.0000 mg | ORAL_CAPSULE | Freq: Two times a day (BID) | ORAL | 0 refills | Status: DC

## 2021-03-22 MED ORDER — ALBUTEROL SULFATE (2.5 MG/3ML) 0.083% IN NEBU
2.5000 mg | INHALATION_SOLUTION | Freq: Four times a day (QID) | RESPIRATORY_TRACT | 0 refills | Status: DC | PRN
Start: 2021-03-22 — End: 2021-11-12

## 2021-03-22 MED ORDER — PREDNISONE 10 MG PO TABS: ORAL_TABLET | ORAL | 0 refills | Status: AC

## 2021-03-22 MED ORDER — ALBUTEROL SULFATE (2.5 MG/3ML) 0.083% IN NEBU: 2.5000 mg | INHALATION_SOLUTION | Freq: Four times a day (QID) | RESPIRATORY_TRACT | 12 refills | Status: DC | PRN

## 2021-03-22 NOTE — ED Provider Notes (Addendum)
CHIEF COMPLAINT:   Chief Complaint  Patient presents with   Cough     SUBJECTIVE/HPI:  HPI A very pleasant 75 y.o.Female presents today with cough which started on Tuesday along with bilateral ear pain and fullness.  Patient reports feeling feverish.  Patient reports that she is currently enrolled in a "lung study" with [facility] and received a report yesterday of a CT scan.  Patient reports that her chest feels tight and has been experiencing episodes where she is unable to take a deep breath.  Patient reports that typically the symptoms will be resolved with antibiotics and steroids.  Patient also reports that she has a home nebulizer machine, but states that she is out of her albuterol nebulizer solution. Patient does not report any chest pain, palpitations, visual changes, weakness, tingling, headache, nausea, vomiting, diarrhea, chills.   has a past medical history of Arthritis, Bladder incontinence, Broken foot, Cataracts, bilateral, Chronic kidney insufficiency, COPD (chronic obstructive pulmonary disease) ([place]), CVA (cerebral vascular accident) ([place]) (2016), Diabetes mellitus, HLD (hyperlipidemia), cardiovascular stress test, Hypertension, Lacunar stroke of left subthalamic region [facility]) (02/2015), Leg pain, Lower back pain, Neuromuscular disorder ([place]), Orthostatic hypotension, Osteopenia (01/2017), PCOS (polycystic ovarian syndrome), Personal history of tobacco use, presenting hazards to health (01/09/2015), and Sleep apnea.  ROS:  Review of Systems See Subjective/HPI Medications, Allergies and Problem List personally reviewed in Epic today OBJECTIVE:   Vitals:   03/22/21 0957 03/22/21 1021  BP: (!) 200/90 (!) 188/77  Pulse: (!) 103   Resp: 18   Temp: 98.8 F (37.1 C)   SpO2: 98%     Physical Exam   General: Appears well-developed and well-nourished. No acute distress.  HEENT Head: Normocephalic and atraumatic.   Ears: Hearing grossly intact, no drainage or visible  deformity.  Nose: No nasal deviation.   Mouth/Throat: No stridor or tracheal deviation.  Eyes: Conjunctivae and EOM are normal. No eye drainage or scleral icterus bilaterally.  Neck: Normal range of motion, neck is supple.  Cardiovascular: Normal rate. Regular rhythm; no murmurs, gallops, or rubs.  Pulm/Chest: No respiratory distress.  Rhonchi with mild wheezing to inspiration noted to bilateral lower lung fields with diminished lung sounds.  Upper lung fields CTA.  Intermittent forceful hacking cough noted. Neurological: Alert and oriented to person, place, and time.  Skin: Skin is warm and dry.  No rashes, lesions, abrasions or bruising noted to skin.   Psychiatric: Normal mood, affect, behavior, and thought content.   Vital signs and nursing note reviewed.   Patient stable and cooperative with examination. PROCEDURES:    LABS/X-RAYS/EKG/MEDS:   No results found for any visits on 03/22/21.  MEDICAL DECISION MAKING:   Patient presents with cough which started on Tuesday along with bilateral ear pain and fullness.  Patient reports feeling feverish.  Patient reports that she is currently enrolled in a "lung study" with [facility] and received a report yesterday of a CT scan.  Patient reports that her chest feels tight and has been experiencing episodes where she is unable to take a deep breath.  Patient reports that typically the symptoms will be resolved with antibiotics and steroids.  Patient also reports that she has a home nebulizer machine, but states that she is out of her albuterol nebulizer solution. Patient does not report any chest pain, palpitations, visual changes, weakness, tingling, headache, nausea, vomiting, diarrhea, chills.  Given symptoms along with assessment findings, likely lower respiratory tract infection.  The patient did have a CT scan completed 03/19/2021  which showed the impression to include a mild multilobar bilateral bronchopneumonia.  Rx'd doxycycline to the  patient's preferred pharmacy along with a low-dose steroid taper.  Patient reports that she is able to tolerate steroids well.  Also Rx'd a refill to the patient's pharmacy for her albuterol nebulizer solution to use as needed every 6 hours for shortness of breath.  Did advise that if she begins to have any worsening shortness of breath, chest pain, fevers she will need to report to the emergency department for in person evaluation.  Patient verbalized understanding and agreed with treatment plan.  Patient stable upon discharge. ASSESSMENT/PLAN:  1. Lower respiratory tract infection - doxycycline (VIBRAMYCIN) 100 MG capsule; Take 1 capsule (100 mg total) by mouth 2 (two) times daily.  Dispense: 20 capsule; Refill: 0  2. Shortness of breath - predniSONE (DELTASONE) 10 MG tablet; Take 3 tablets (30 mg total) by mouth daily with breakfast for 2 days, THEN 2 tablets (20 mg total) daily with breakfast for 2 days, THEN 1 tablet (10 mg total) daily with breakfast for 2 days.  Dispense: 12 tablet; Refill: 0 Instructions about new medications and side effects provided.  Plan:   Discharge Instructions      Take doxycycline and prednisone as prescribed.  Use your albuterol nebulizer solution every 6 hours as needed for shortness of breath.  Rest, push lots of fluids (especially water), and utilize supportive care for symptoms. You may take take acetaminophen (Tylenol) every 4-6 hours or ibuprofen every 6-8 hours for muscle pain, joint pain, headaches. Mucinex (guaifenesin) may be taken over the counter for cough as needed and can loosen phlegm. Please read the instructions and take as directed. Saline nasal sprays to rinse congestion can help as well. Warm tea with lemon and honey can sooth sore throat and cough, as can cough drops.  ED for new-onset fever, difficulty breathing, chest pain, symptoms lasting >3 to 4 weeks, or bloody sputum.          Serafina Royals, [place] 03/22/21 [place]    Serafina Royals, [place] 03/22/21 1300

## 2021-03-22 NOTE — Telephone Encounter (Signed)
Pharmacy is out of her albuterol nebulizer solution.  Sent a different formulation to her pharmacy.

## 2021-03-22 NOTE — ED Triage Notes (Addendum)
Patient is in lung study with [facility].  Received report yesterday.    Patient went for study on Monday Started with cough on Tuesday.  Patient since has developed bilateral ear pain and fullness.  Complains of feeling feverish  Patient is wearing a cardiac monitor Patient says she needs antibiotic and steroids.

## 2021-03-22 NOTE — Discharge Instructions (Addendum)
Take doxycycline and prednisone as prescribed.  Use your albuterol nebulizer solution every 6 hours as needed for shortness of breath.  Rest, push lots of fluids (especially water), and utilize supportive care for symptoms. You may take take acetaminophen (Tylenol) every 4-6 hours or ibuprofen every 6-8 hours for muscle pain, joint pain, headaches. Mucinex (guaifenesin) may be taken over the counter for cough as needed and can loosen phlegm. Please read the instructions and take as directed. Saline nasal sprays to rinse congestion can help as well. Warm tea with lemon and honey can sooth sore throat and cough, as can cough drops.  ED for new-onset fever, difficulty breathing, chest pain, symptoms lasting >3 to 4 weeks, or bloody sputum.

## 2021-03-26 DIAGNOSIS — Z720 Tobacco use: Secondary | ICD-10-CM | POA: Diagnosis not present

## 2021-03-26 DIAGNOSIS — Z23 Encounter for immunization: Secondary | ICD-10-CM | POA: Diagnosis not present

## 2021-03-26 DIAGNOSIS — G4733 Obstructive sleep apnea (adult) (pediatric): Secondary | ICD-10-CM | POA: Diagnosis not present

## 2021-03-26 DIAGNOSIS — I2510 Atherosclerotic heart disease of native coronary artery without angina pectoris: Secondary | ICD-10-CM | POA: Diagnosis not present

## 2021-03-26 DIAGNOSIS — R609 Edema, unspecified: Secondary | ICD-10-CM | POA: Diagnosis not present

## 2021-03-26 DIAGNOSIS — R002 Palpitations: Secondary | ICD-10-CM | POA: Diagnosis not present

## 2021-03-26 DIAGNOSIS — E785 Hyperlipidemia, unspecified: Secondary | ICD-10-CM | POA: Diagnosis not present

## 2021-03-26 DIAGNOSIS — I10 Essential (primary) hypertension: Secondary | ICD-10-CM | POA: Diagnosis not present

## 2021-03-26 DIAGNOSIS — J449 Chronic obstructive pulmonary disease, unspecified: Secondary | ICD-10-CM | POA: Diagnosis not present

## 2021-03-26 DIAGNOSIS — I639 Cerebral infarction, unspecified: Secondary | ICD-10-CM | POA: Diagnosis not present

## 2021-04-02 DIAGNOSIS — H353211 Exudative age-related macular degeneration, right eye, with active choroidal neovascularization: Secondary | ICD-10-CM | POA: Diagnosis not present

## 2021-04-02 DIAGNOSIS — Z1231 Encounter for screening mammogram for malignant neoplasm of breast: Secondary | ICD-10-CM | POA: Diagnosis not present

## 2021-04-03 DIAGNOSIS — F5104 Psychophysiologic insomnia: Secondary | ICD-10-CM | POA: Diagnosis not present

## 2021-04-03 DIAGNOSIS — M5416 Radiculopathy, lumbar region: Secondary | ICD-10-CM | POA: Diagnosis not present

## 2021-04-03 DIAGNOSIS — G4733 Obstructive sleep apnea (adult) (pediatric): Secondary | ICD-10-CM | POA: Diagnosis not present

## 2021-04-03 DIAGNOSIS — I10 Essential (primary) hypertension: Secondary | ICD-10-CM | POA: Diagnosis not present

## 2021-04-09 DIAGNOSIS — R0602 Shortness of breath: Secondary | ICD-10-CM | POA: Diagnosis not present

## 2021-04-09 DIAGNOSIS — G4733 Obstructive sleep apnea (adult) (pediatric): Secondary | ICD-10-CM | POA: Diagnosis not present

## 2021-04-09 DIAGNOSIS — R053 Chronic cough: Secondary | ICD-10-CM | POA: Diagnosis not present

## 2021-04-09 DIAGNOSIS — F17200 Nicotine dependence, unspecified, uncomplicated: Secondary | ICD-10-CM | POA: Diagnosis not present

## 2021-04-09 DIAGNOSIS — J449 Chronic obstructive pulmonary disease, unspecified: Secondary | ICD-10-CM | POA: Diagnosis not present

## 2021-04-10 DIAGNOSIS — R0602 Shortness of breath: Secondary | ICD-10-CM | POA: Diagnosis not present

## 2021-04-10 DIAGNOSIS — R059 Cough, unspecified: Secondary | ICD-10-CM | POA: Diagnosis not present

## 2021-04-24 DIAGNOSIS — M160 Bilateral primary osteoarthritis of hip: Secondary | ICD-10-CM | POA: Diagnosis not present

## 2021-04-24 DIAGNOSIS — M47896 Other spondylosis, lumbar region: Secondary | ICD-10-CM | POA: Diagnosis not present

## 2021-04-24 DIAGNOSIS — M4310 Spondylolisthesis, site unspecified: Secondary | ICD-10-CM | POA: Diagnosis not present

## 2021-04-26 ENCOUNTER — Other Ambulatory Visit: Payer: Self-pay | Admitting: Neurosurgery

## 2021-04-26 ENCOUNTER — Other Ambulatory Visit: Payer: Self-pay | Admitting: Cardiology

## 2021-04-26 DIAGNOSIS — M5416 Radiculopathy, lumbar region: Secondary | ICD-10-CM

## 2021-04-30 ENCOUNTER — Telehealth: Payer: Self-pay

## 2021-04-30 NOTE — Progress Notes (Signed)
Phone call to patient to review instructions for 13 hr prep for injection w/ CT contrast on 05/04/21 at 2:30 PM. Prescription called into [facility]. Pt aware and verbalized understanding of instructions. Prescription: 05/04/21 1:30 AM- 50mg  Prednisone 05/04/21 7:30 AM- 50mg  Prednisone 05/04/21 1:30 PM - 50mg  Prednisone and 50mg  Benadryl   Pt reports she has benadryl at home and does not need this called in as a prescription. The patient verbalizes understanding of when to take this medication and understands to not drive while taking the medication as it may cause drowsiness.

## 2021-05-03 ENCOUNTER — Telehealth: Payer: Self-pay | Admitting: Acute Care

## 2021-05-03 DIAGNOSIS — Z87891 Personal history of nicotine dependence: Secondary | ICD-10-CM

## 2021-05-03 DIAGNOSIS — F17210 Nicotine dependence, cigarettes, uncomplicated: Secondary | ICD-10-CM

## 2021-05-03 NOTE — Addendum Note (Signed)
Addended by: Doroteo Glassman D on: 05/03/2021 03:11 PM   Modules accepted: Orders

## 2021-05-03 NOTE — Telephone Encounter (Signed)
I have called [name] with the results of her low-dose CT.  Her scan done 03/21/2021 was read as a lung RADS 0.  There was notation of new areas of patchy multifocal groundglass attenuation throughout the lungs suggestive of an acute infectious or inflammatory process.  She has subsequently gone to [place] clinic where Dr. Raul Del treated her with antibiotics, specifically doxycycline and a follow-up chest x-ray was done, which she states showed resolution of her pneumonia. She is leaving to go on a cruise on December 21 therefore if we could schedule her follow-up low-dose CT for prior to that date perhaps December 15 through the 20th.  2022 Patient verbalized understanding and is in agreement with this plan. [name] please place follow-up low-dose CT for December 15 through 20. Fax results to PCP Thanks so much

## 2021-05-03 NOTE — Telephone Encounter (Signed)
CT results faxed to PCP. Order placed for CT f/u week of 05/31/21. Left message for pt to call back to schedule.

## 2021-05-04 ENCOUNTER — Ambulatory Visit
Admission: RE | Admit: 2021-05-04 | Discharge: 2021-05-04 | Disposition: A | Discharge status: Home / Self Care | Payer: HMO | Source: Ambulatory Visit | Attending: Neurosurgery | Admitting: Neurosurgery

## 2021-05-04 ENCOUNTER — Other Ambulatory Visit: Payer: Self-pay

## 2021-05-04 DIAGNOSIS — M5136 Other intervertebral disc degeneration, lumbar region: Secondary | ICD-10-CM | POA: Diagnosis not present

## 2021-05-04 DIAGNOSIS — R002 Palpitations: Secondary | ICD-10-CM | POA: Diagnosis not present

## 2021-05-04 DIAGNOSIS — M47817 Spondylosis without myelopathy or radiculopathy, lumbosacral region: Secondary | ICD-10-CM | POA: Diagnosis not present

## 2021-05-04 DIAGNOSIS — M5416 Radiculopathy, lumbar region: Secondary | ICD-10-CM

## 2021-05-04 MED ORDER — IOPAMIDOL (ISOVUE-M 200) INJECTION 41%
1.0000 mL | Freq: Once | INTRAMUSCULAR | Status: AC
  Administered 2021-05-04: 1 mL via EPIDURAL

## 2021-05-04 MED ORDER — METHYLPREDNISOLONE ACETATE 40 MG/ML INJ SUSP (RADIOLOG
80.0000 mg | Freq: Once | INTRAMUSCULAR | Status: AC
  Administered 2021-05-04: 80 mg via EPIDURAL

## 2021-05-04 MED ORDER — DIAZEPAM 5 MG PO TABS
5.0000 mg | ORAL_TABLET | Freq: Once | ORAL | Status: AC
  Administered 2021-05-04: 5 mg via ORAL

## 2021-05-04 NOTE — Discharge Instructions (Signed)
Post Procedure Spinal Discharge Instruction Sheet  You may resume a regular diet and any medications that you routinely take (including pain medications) unless otherwise noted by MD.  No driving day of procedure.  Light activity throughout the rest of the day.  Do not do any strenuous work, exercise, bending or lifting.  The day following the procedure, you can resume normal physical activity but you should refrain from exercising or physical therapy for at least three days thereafter.  You may apply ice to the injection site, 20 minutes on, 20 minutes off, as needed. Do not apply ice directly to skin.    Common Side Effects:  Headaches- take your usual medications as directed by your physician.  Increase your fluid intake.  Caffeinated beverages may be helpful.  Lie flat in bed until your headache resolves.  Restlessness or inability to sleep- you may have trouble sleeping for the next few days.  Ask your referring physician if you need any medication for sleep.  Facial flushing or redness- should subside within a few days.  Increased pain- a temporary increase in pain a day or two following your procedure is not unusual.  Take your pain medication as prescribed by your referring physician.  Leg cramps  Please contact our office at [phone] for the following symptoms: Fever greater than 100 degrees. Headaches unresolved with medication after 2-3 days. Increased swelling, pain, or redness at injection site.   Thank you for visiting [facility] today.    YOU MAY RESUME YOUR PLAVIX ANYTIME AFTER INJECTION TODAY

## 2021-05-07 DIAGNOSIS — E785 Hyperlipidemia, unspecified: Secondary | ICD-10-CM | POA: Diagnosis not present

## 2021-05-07 DIAGNOSIS — R609 Edema, unspecified: Secondary | ICD-10-CM | POA: Diagnosis not present

## 2021-05-07 DIAGNOSIS — Z720 Tobacco use: Secondary | ICD-10-CM | POA: Diagnosis not present

## 2021-05-07 DIAGNOSIS — I2510 Atherosclerotic heart disease of native coronary artery without angina pectoris: Secondary | ICD-10-CM | POA: Diagnosis not present

## 2021-05-07 DIAGNOSIS — J449 Chronic obstructive pulmonary disease, unspecified: Secondary | ICD-10-CM | POA: Diagnosis not present

## 2021-05-07 DIAGNOSIS — I10 Essential (primary) hypertension: Secondary | ICD-10-CM | POA: Diagnosis not present

## 2021-05-07 DIAGNOSIS — R002 Palpitations: Secondary | ICD-10-CM | POA: Diagnosis not present

## 2021-05-07 DIAGNOSIS — F5104 Psychophysiologic insomnia: Secondary | ICD-10-CM | POA: Diagnosis not present

## 2021-05-07 DIAGNOSIS — I639 Cerebral infarction, unspecified: Secondary | ICD-10-CM | POA: Diagnosis not present

## 2021-05-07 DIAGNOSIS — G4733 Obstructive sleep apnea (adult) (pediatric): Secondary | ICD-10-CM | POA: Diagnosis not present

## 2021-05-08 NOTE — Telephone Encounter (Signed)
Has CT appt scheduled for 05/30/21 per appt listings

## 2021-05-15 DIAGNOSIS — M5416 Radiculopathy, lumbar region: Secondary | ICD-10-CM | POA: Diagnosis not present

## 2021-05-16 ENCOUNTER — Other Ambulatory Visit: Payer: Self-pay | Admitting: Neurosurgery

## 2021-05-16 DIAGNOSIS — M5416 Radiculopathy, lumbar region: Secondary | ICD-10-CM

## 2021-05-17 DIAGNOSIS — N184 Chronic kidney disease, stage 4 (severe): Secondary | ICD-10-CM | POA: Diagnosis not present

## 2021-05-17 DIAGNOSIS — E1122 Type 2 diabetes mellitus with diabetic chronic kidney disease: Secondary | ICD-10-CM | POA: Diagnosis not present

## 2021-05-21 ENCOUNTER — Ambulatory Visit: Payer: PPO | Admitting: Podiatry

## 2021-05-22 DIAGNOSIS — I10 Essential (primary) hypertension: Secondary | ICD-10-CM | POA: Diagnosis not present

## 2021-05-22 DIAGNOSIS — N2581 Secondary hyperparathyroidism of renal origin: Secondary | ICD-10-CM | POA: Diagnosis not present

## 2021-05-22 DIAGNOSIS — D631 Anemia in chronic kidney disease: Secondary | ICD-10-CM | POA: Diagnosis not present

## 2021-05-22 DIAGNOSIS — E1122 Type 2 diabetes mellitus with diabetic chronic kidney disease: Secondary | ICD-10-CM | POA: Diagnosis not present

## 2021-05-22 DIAGNOSIS — N1832 Chronic kidney disease, stage 3b: Secondary | ICD-10-CM | POA: Diagnosis not present

## 2021-05-28 DIAGNOSIS — D2272 Melanocytic nevi of left lower limb, including hip: Secondary | ICD-10-CM | POA: Diagnosis not present

## 2021-05-28 DIAGNOSIS — L570 Actinic keratosis: Secondary | ICD-10-CM | POA: Diagnosis not present

## 2021-05-28 DIAGNOSIS — D2271 Melanocytic nevi of right lower limb, including hip: Secondary | ICD-10-CM | POA: Diagnosis not present

## 2021-05-28 DIAGNOSIS — L732 Hidradenitis suppurativa: Secondary | ICD-10-CM | POA: Diagnosis not present

## 2021-05-28 DIAGNOSIS — D225 Melanocytic nevi of trunk: Secondary | ICD-10-CM | POA: Diagnosis not present

## 2021-05-28 DIAGNOSIS — D2262 Melanocytic nevi of left upper limb, including shoulder: Secondary | ICD-10-CM | POA: Diagnosis not present

## 2021-05-28 DIAGNOSIS — D2261 Melanocytic nevi of right upper limb, including shoulder: Secondary | ICD-10-CM | POA: Diagnosis not present

## 2021-05-28 DIAGNOSIS — X32XXXA Exposure to sunlight, initial encounter: Secondary | ICD-10-CM | POA: Diagnosis not present

## 2021-05-30 ENCOUNTER — Ambulatory Visit: Payer: HMO

## 2021-06-04 ENCOUNTER — Ambulatory Visit: Payer: PPO | Admitting: Podiatry

## 2021-06-20 ENCOUNTER — Ambulatory Visit (INDEPENDENT_AMBULATORY_CARE_PROVIDER_SITE_OTHER): Payer: HMO | Admitting: Podiatry

## 2021-06-20 ENCOUNTER — Other Ambulatory Visit: Payer: Self-pay

## 2021-06-20 ENCOUNTER — Encounter: Payer: Self-pay | Admitting: Podiatry

## 2021-06-20 DIAGNOSIS — M79676 Pain in unspecified toe(s): Secondary | ICD-10-CM | POA: Diagnosis not present

## 2021-06-20 DIAGNOSIS — E1142 Type 2 diabetes mellitus with diabetic polyneuropathy: Secondary | ICD-10-CM

## 2021-06-20 DIAGNOSIS — B351 Tinea unguium: Secondary | ICD-10-CM | POA: Diagnosis not present

## 2021-06-20 NOTE — Progress Notes (Signed)
She presents today for follow-up of her diabetic feet.  She states that her nails are long and need to be trimmed.  Objective: Vital signs are stable she is alert and oriented x3.  Pulses are palpable.  She does have some small abrasion to the distal aspect of the toes which she had tried trimming the toenails and cut the skin.  Currently no open lesions are noted.  Nails are long dystrophic clinically mycotic.  Assessment: Pain in limb secondary to diabetic peripheral neuropathy and dystrophic nails.  Plan: Debridement of mycotic nails.  Follow-up with her in 3 months

## 2021-06-21 ENCOUNTER — Telehealth: Payer: Self-pay

## 2021-06-21 DIAGNOSIS — J449 Chronic obstructive pulmonary disease, unspecified: Secondary | ICD-10-CM | POA: Diagnosis not present

## 2021-06-21 DIAGNOSIS — G4733 Obstructive sleep apnea (adult) (pediatric): Secondary | ICD-10-CM | POA: Diagnosis not present

## 2021-06-21 NOTE — Telephone Encounter (Signed)
Phone call to patient to review instructions for 13 hr prep for Spinal injection w/ contrast on 06/29/21 at 11:30 AM. Prescription called into [facility]. Pt aware and verbalized understanding of instructions. Prescription: 06/28/21 at 10:30 PM- 50mg  Prednisone 06/29/21 at 4:30 AM- 50mg  Prednisone 06/29/21 at 10:30 AM - 50mg  Prednisone and 50mg  Benadryl

## 2021-06-26 DIAGNOSIS — R1310 Dysphagia, unspecified: Secondary | ICD-10-CM | POA: Diagnosis not present

## 2021-06-26 DIAGNOSIS — H353221 Exudative age-related macular degeneration, left eye, with active choroidal neovascularization: Secondary | ICD-10-CM | POA: Diagnosis not present

## 2021-06-28 DIAGNOSIS — R1310 Dysphagia, unspecified: Secondary | ICD-10-CM | POA: Insufficient documentation

## 2021-06-28 DIAGNOSIS — R1319 Other dysphagia: Secondary | ICD-10-CM | POA: Insufficient documentation

## 2021-06-29 ENCOUNTER — Other Ambulatory Visit: Payer: Self-pay

## 2021-06-29 ENCOUNTER — Ambulatory Visit
Admission: RE | Admit: 2021-06-29 | Discharge: 2021-06-29 | Disposition: A | Discharge status: Home / Self Care | Payer: HMO | Source: Ambulatory Visit | Attending: Neurosurgery | Admitting: Neurosurgery

## 2021-06-29 DIAGNOSIS — M5416 Radiculopathy, lumbar region: Secondary | ICD-10-CM | POA: Diagnosis not present

## 2021-06-29 DIAGNOSIS — I10 Essential (primary) hypertension: Secondary | ICD-10-CM | POA: Diagnosis not present

## 2021-06-29 DIAGNOSIS — F5104 Psychophysiologic insomnia: Secondary | ICD-10-CM | POA: Diagnosis not present

## 2021-06-29 MED ORDER — IOPAMIDOL (ISOVUE-M 200) INJECTION 41%
1.0000 mL | Freq: Once | INTRAMUSCULAR | Status: AC
  Administered 2021-06-29: 1 mL via EPIDURAL

## 2021-06-29 MED ORDER — DIAZEPAM 5 MG PO TABS
5.0000 mg | ORAL_TABLET | Freq: Once | ORAL | Status: AC
  Administered 2021-06-29: 5 mg via ORAL

## 2021-06-29 MED ORDER — METHYLPREDNISOLONE ACETATE 40 MG/ML INJ SUSP (RADIOLOG
80.0000 mg | Freq: Once | INTRAMUSCULAR | Status: AC
  Administered 2021-06-29: 80 mg via EPIDURAL

## 2021-06-29 NOTE — Discharge Instructions (Signed)
Post Procedure Spinal Discharge Instruction Sheet  You may resume a regular diet and any medications that you routinely take (including pain medications) unless otherwise noted by MD.  No driving day of procedure.  Light activity throughout the rest of the day.  Do not do any strenuous work, exercise, bending or lifting.  The day following the procedure, you can resume normal physical activity but you should refrain from exercising or physical therapy for at least three days thereafter.  You may apply ice to the injection site, 20 minutes on, 20 minutes off, as needed. Do not apply ice directly to skin.    Common Side Effects:  Headaches- take your usual medications as directed by your physician.  Increase your fluid intake.  Caffeinated beverages may be helpful.  Lie flat in bed until your headache resolves.  Restlessness or inability to sleep- you may have trouble sleeping for the next few days.  Ask your referring physician if you need any medication for sleep.  Facial flushing or redness- should subside within a few days.  Increased pain- a temporary increase in pain a day or two following your procedure is not unusual.  Take your pain medication as prescribed by your referring physician.  Leg cramps  Please contact our office at [phone] for the following symptoms: Fever greater than 100 degrees. Headaches unresolved with medication after 2-3 days. Increased swelling, pain, or redness at injection site.   Thank you for visiting [facility] today.    YOU MAY RESUME YOUR PLAVIX AND ASPIRIN ANYTIME AFTER INJECTION TODAY

## 2021-07-13 DIAGNOSIS — N3020 Other chronic cystitis without hematuria: Secondary | ICD-10-CM | POA: Diagnosis not present

## 2021-07-13 DIAGNOSIS — R8271 Bacteriuria: Secondary | ICD-10-CM | POA: Diagnosis not present

## 2021-07-13 DIAGNOSIS — E118 Type 2 diabetes mellitus with unspecified complications: Secondary | ICD-10-CM | POA: Diagnosis not present

## 2021-07-17 DIAGNOSIS — F17200 Nicotine dependence, unspecified, uncomplicated: Secondary | ICD-10-CM | POA: Diagnosis not present

## 2021-07-17 DIAGNOSIS — R1310 Dysphagia, unspecified: Secondary | ICD-10-CM | POA: Diagnosis not present

## 2021-07-17 DIAGNOSIS — R600 Localized edema: Secondary | ICD-10-CM | POA: Diagnosis not present

## 2021-07-17 DIAGNOSIS — E1151 Type 2 diabetes mellitus with diabetic peripheral angiopathy without gangrene: Secondary | ICD-10-CM | POA: Diagnosis not present

## 2021-07-17 DIAGNOSIS — N184 Chronic kidney disease, stage 4 (severe): Secondary | ICD-10-CM | POA: Diagnosis not present

## 2021-07-17 DIAGNOSIS — N2581 Secondary hyperparathyroidism of renal origin: Secondary | ICD-10-CM | POA: Diagnosis not present

## 2021-07-17 DIAGNOSIS — Z0000 Encounter for general adult medical examination without abnormal findings: Secondary | ICD-10-CM | POA: Diagnosis not present

## 2021-07-18 DIAGNOSIS — R1310 Dysphagia, unspecified: Secondary | ICD-10-CM | POA: Diagnosis not present

## 2021-07-25 ENCOUNTER — Other Ambulatory Visit: Payer: Self-pay

## 2021-07-25 ENCOUNTER — Encounter: Payer: Self-pay | Admitting: Internal Medicine

## 2021-07-25 ENCOUNTER — Ambulatory Visit: Payer: HMO | Admitting: Anesthesiology

## 2021-07-25 ENCOUNTER — Encounter
Admission: RE | Disposition: A | Discharge status: Home / Self Care | Payer: Self-pay | Source: Ambulatory Visit | Attending: Internal Medicine

## 2021-07-25 ENCOUNTER — Ambulatory Visit
Admission: RE | Admit: 2021-07-25 | Discharge: 2021-07-25 | Disposition: A | Discharge status: Home / Self Care | Payer: HMO | Source: Ambulatory Visit | Attending: Internal Medicine | Admitting: Internal Medicine

## 2021-07-25 DIAGNOSIS — E785 Hyperlipidemia, unspecified: Secondary | ICD-10-CM | POA: Diagnosis not present

## 2021-07-25 DIAGNOSIS — K449 Diaphragmatic hernia without obstruction or gangrene: Secondary | ICD-10-CM | POA: Diagnosis not present

## 2021-07-25 DIAGNOSIS — I129 Hypertensive chronic kidney disease with stage 1 through stage 4 chronic kidney disease, or unspecified chronic kidney disease: Secondary | ICD-10-CM | POA: Insufficient documentation

## 2021-07-25 DIAGNOSIS — K2100 Gastro-esophageal reflux disease with esophagitis, without bleeding: Secondary | ICD-10-CM | POA: Diagnosis not present

## 2021-07-25 DIAGNOSIS — T182XXA Foreign body in stomach, initial encounter: Secondary | ICD-10-CM | POA: Diagnosis not present

## 2021-07-25 DIAGNOSIS — K2970 Gastritis, unspecified, without bleeding: Secondary | ICD-10-CM | POA: Insufficient documentation

## 2021-07-25 DIAGNOSIS — R1314 Dysphagia, pharyngoesophageal phase: Secondary | ICD-10-CM | POA: Diagnosis not present

## 2021-07-25 DIAGNOSIS — K222 Esophageal obstruction: Secondary | ICD-10-CM | POA: Insufficient documentation

## 2021-07-25 DIAGNOSIS — N189 Chronic kidney disease, unspecified: Secondary | ICD-10-CM | POA: Insufficient documentation

## 2021-07-25 DIAGNOSIS — Q396 Congenital diverticulum of esophagus: Secondary | ICD-10-CM | POA: Insufficient documentation

## 2021-07-25 DIAGNOSIS — G4730 Sleep apnea, unspecified: Secondary | ICD-10-CM | POA: Insufficient documentation

## 2021-07-25 DIAGNOSIS — Z794 Long term (current) use of insulin: Secondary | ICD-10-CM | POA: Diagnosis not present

## 2021-07-25 DIAGNOSIS — K209 Esophagitis, unspecified without bleeding: Secondary | ICD-10-CM | POA: Diagnosis not present

## 2021-07-25 DIAGNOSIS — K225 Diverticulum of esophagus, acquired: Secondary | ICD-10-CM | POA: Diagnosis not present

## 2021-07-25 DIAGNOSIS — J449 Chronic obstructive pulmonary disease, unspecified: Secondary | ICD-10-CM | POA: Diagnosis not present

## 2021-07-25 DIAGNOSIS — Z8673 Personal history of transient ischemic attack (TIA), and cerebral infarction without residual deficits: Secondary | ICD-10-CM | POA: Diagnosis not present

## 2021-07-25 DIAGNOSIS — E1143 Type 2 diabetes mellitus with diabetic autonomic (poly)neuropathy: Secondary | ICD-10-CM | POA: Diagnosis not present

## 2021-07-25 DIAGNOSIS — E1122 Type 2 diabetes mellitus with diabetic chronic kidney disease: Secondary | ICD-10-CM | POA: Diagnosis not present

## 2021-07-25 HISTORY — PX: ESOPHAGOGASTRODUODENOSCOPY: SHX5428

## 2021-07-25 LAB — GLUCOSE, CAPILLARY: Glucose-Capillary: 92 mg/dL (ref 70–99)

## 2021-07-25 SURGERY — EGD (ESOPHAGOGASTRODUODENOSCOPY)
Anesthesia: General

## 2021-07-25 MED ORDER — LABETALOL HCL 5 MG/ML IV SOLN
INTRAVENOUS | Status: AC
  Filled 2021-07-25: qty 4

## 2021-07-25 MED ORDER — LABETALOL HCL 5 MG/ML IV SOLN
INTRAVENOUS | Status: DC | PRN
Start: 2021-07-25 — End: 2021-07-25
  Administered 2021-07-25: 5 mg via INTRAVENOUS

## 2021-07-25 MED ORDER — SODIUM CHLORIDE 0.9 % IV SOLN
INTRAVENOUS | Status: DC
Start: 2021-07-25 — End: 2021-07-25

## 2021-07-25 MED ORDER — LIDOCAINE HCL (CARDIAC) PF 100 MG/5ML IV SOSY
PREFILLED_SYRINGE | INTRAVENOUS | Status: DC | PRN
  Administered 2021-07-25: 80 mg via INTRAVENOUS

## 2021-07-25 MED ORDER — PROPOFOL 10 MG/ML IV BOLUS
INTRAVENOUS | Status: DC | PRN
  Administered 2021-07-25: 40 mg via INTRAVENOUS
  Administered 2021-07-25: 30 mg via INTRAVENOUS
  Administered 2021-07-25: 50 mg via INTRAVENOUS
  Administered 2021-07-25: 40 mg via INTRAVENOUS
  Administered 2021-07-25: 50 mg via INTRAVENOUS

## 2021-07-25 NOTE — H&P (Signed)
Outpatient short stay form Pre-procedure 07/25/2021 9:21 AM Lysa Livengood K. Alice Reichert, M.D.  Primary Physician: [place] Pink, M.D.  Chief Complaint: Dysphagia  History of present illness:  Patient with infrequent heartburn c/o esophageal dysphagia for the last several weeks to the mid-sternum with solids. PPI therapy has not improved symptoms. Denies hemetemesis, melena.    Current Facility-Administered Medications:    0.9 %  sodium chloride infusion, , Intravenous, Continuous, Jasiyah Paulding, Benay Pike, MD  Medications Prior to Admission  Medication Sig Dispense Refill Last Dose   albuterol (PROVENTIL) (2.5 MG/3ML) 0.083% nebulizer solution Take 3 mLs (2.5 mg total) by nebulization every 6 (six) hours as needed for wheezing or shortness of breath. 75 mL 0 Past Month   doxycycline (VIBRAMYCIN) 100 MG capsule Take 1 capsule (100 mg total) by mouth 2 (two) times daily. 20 capsule 0 07/24/2021   furosemide (LASIX) 20 MG tablet    Past Month   imipramine (TOFRANIL) 50 MG tablet Take 25 mg by mouth every other day.   07/24/2021   insulin detemir (LEVEMIR) 100 UNIT/ML injection Inject 60 Units into the skin daily.    07/24/2021   LORazepam (ATIVAN) 2 MG tablet Take 1 mg by mouth every 8 (eight) hours as needed.   Past Week   losartan (COZAAR) 100 MG tablet Take 1 tablet (100 mg total) by mouth daily. 30 tablet 2 07/24/2021   metoprolol succinate (TOPROL-XL) 50 MG 24 hr tablet Take 50 mg by mouth daily.    07/24/2021   Multiple Vitamins-Minerals (VISION VITAMINS PO) Take 1 capsule by mouth 2 (two) times daily. AREDS   Past Week   nitrofurantoin, macrocrystal-monohydrate, (MACROBID) 100 MG capsule Take 100 mg by mouth at bedtime.   07/24/2021   oxybutynin (DITROPAN) 5 MG tablet Take 5 mg by mouth daily.    07/24/2021   pravastatin (PRAVACHOL) 40 MG tablet Take 40 mg by mouth daily.    07/24/2021   zolpidem (AMBIEN) 10 MG tablet Take 10 mg by mouth at bedtime as needed.   Past Week   acetaminophen (TYLENOL) 500 MG tablet Take  1,000 mg by mouth every 6 (six) hours as needed for moderate pain or headache.      BD INSULIN SYRINGE U/F 31G X 5/16" 1 ML MISC USE 1 SYRINGE AS DIRECTED      clopidogrel (PLAVIX) 75 MG tablet    07/20/2021   metFORMIN (GLUCOPHAGE) 1000 MG tablet Take 1,000 mg by mouth 2 (two) times daily with a meal. (Patient not taking: Reported on 03/22/2021)      ondansetron (ZOFRAN ODT) 8 MG disintegrating tablet Take 1 tablet (8 mg total) by mouth every 8 (eight) hours as needed. (Patient not taking: Reported on 02/26/2019) 6 tablet 0    ONETOUCH ULTRA test strip 4 (four) times daily.        Allergies  Allergen Reactions   Iodine Anaphylaxis and Other (See Comments)    Pt states that she is allergic to ingested iodine only, okay for betadine.     Shellfish Allergy Anaphylaxis   Codeine Nausea And Vomiting   Morphine Sulfate Nausea And Vomiting   Irbesartan Other (See Comments)    Reaction:  Unknown  (Avapro)   Sulfa Antibiotics Other (See Comments)    Fever      Past Medical History:  Diagnosis Date   Arthritis    Bladder incontinence    Broken foot    Cataracts, bilateral    Chronic kidney insufficiency    COPD (chronic obstructive pulmonary  disease) ([place])    wheezing   CVA (cerebral vascular accident) ([place]) 2016   Diabetes mellitus    insulin dependent, Type 2   HLD (hyperlipidemia)    Hx of cardiovascular stress test    a. ETT (6/13):  Ex 5:13; no ischemic changes   Hypertension    controlled on meds   Lacunar stroke of left subthalamic region [facility]) 02/2015   Leg pain    left   Lower back pain    Neuromuscular disorder (HCC)    stroke right hand tingling   Orthostatic hypotension    Osteopenia 01/2017   T score -2.0 stable from prior DEXA   PCOS (polycystic ovarian syndrome)    Personal history of tobacco use, presenting hazards to health 01/09/2015   Sleep apnea    borderline/ CPAP    Review of systems:  Otherwise negative.    Physical Exam  Gen: Alert, oriented.  Appears stated age.  HEENT: [place]/AT. PERRLA. Lungs: CTA, no wheezes. CV: RR nl S1, S2. Abd: soft, benign, no masses. BS+ Ext: No edema. Pulses 2+    Planned procedures: Proceed with EGD. The patient understands the nature of the planned procedure, indications, risks, alternatives and potential complications including but not limited to bleeding, infection, perforation, damage to internal organs and possible oversedation/side effects from anesthesia. The patient agrees and gives consent to proceed.  Please refer to procedure notes for findings, recommendations and patient disposition/instructions.     Delayne Sanzo K. Alice Reichert, M.D. Gastroenterology 07/25/2021  9:21 AM

## 2021-07-25 NOTE — Anesthesia Preprocedure Evaluation (Signed)
Anesthesia Evaluation  Patient identified by MRN, date of birth, ID band Patient awake    Reviewed: Allergy & Precautions, NPO status , Patient's Chart, lab work & pertinent test results  History of Anesthesia Complications Negative for: history of anesthetic complications  Airway Mallampati: III  TM Distance: >3 FB Neck ROM: full    Dental  (+) Chipped, Implants, Caps, Poor Dentition   Pulmonary neg shortness of breath, sleep apnea , COPD, Current Smoker and Patient abstained from smoking.,    Pulmonary exam normal        Cardiovascular Exercise Tolerance: Good hypertension, (-) angina+ CAD  (-) Past MI and (-) DOE Normal cardiovascular exam     Neuro/Psych PSYCHIATRIC DISORDERS  Neuromuscular disease CVA, Residual Symptoms    GI/Hepatic negative GI ROS, Neg liver ROS, neg GERD  ,  Endo/Other  diabetes, Type 2  Renal/GU CRFRenal disease  negative genitourinary   Musculoskeletal  (+) Arthritis ,   Abdominal   Peds  Hematology negative hematology ROS (+)   Anesthesia Other Findings Past Medical History: No date: Arthritis No date: Bladder incontinence No date: Broken foot No date: Cataracts, bilateral No date: Chronic kidney insufficiency No date: COPD (chronic obstructive pulmonary disease) (HCC)     Comment:  wheezing 2016: CVA (cerebral vascular accident) ([place]) No date: Diabetes mellitus     Comment:  insulin dependent, Type 2 No date: HLD (hyperlipidemia) No date: Hx of cardiovascular stress test     Comment:  a. ETT (6/13):  Ex 5:13; no ischemic changes No date: Hypertension     Comment:  controlled on meds 02/2015: Lacunar stroke of left subthalamic region [facility]) No date: Leg pain     Comment:  left No date: Lower back pain No date: Neuromuscular disorder ([place])     Comment:  stroke right hand tingling No date: Orthostatic hypotension 01/2017: Osteopenia     Comment:  T score -2.0 stable from prior  DEXA No date: PCOS (polycystic ovarian syndrome) 01/09/2015: Personal history of tobacco use, presenting hazards to  health No date: Sleep apnea     Comment:  borderline/ CPAP  Past Surgical History: 11/04/2017: BROW LIFT; Bilateral     Comment:  Procedure: BLEPHAROPLASTY UPPER EYELID W/EXCESS SKIN;                Surgeon: Karle Starch, MD;  Location: [place];  Service: Ophthalmology;  Laterality: Bilateral;                DIABETES-insulin dependent uses CPAP 20 yrs ago: CARDIAC CATHETERIZATION     Comment:  found nothing No date: CARPAL TUNNEL RELEASE; Bilateral No date: CATARACT EXTRACTION; Bilateral No date: ELBOW SURGERY; Bilateral No date: FOOT SURGERY No date: Groin Abscess No date: HAND SURGERY No date: KNEE SURGERY; Bilateral No date: LABIAL ABSCESS 05/11/2018: LUMBAR LAMINECTOMY/DECOMPRESSION MICRODISCECTOMY; Left     Comment:  Procedure: LUMBAR LAMINECTOMY/DECOMPRESSION               MICRODISCECTOMY 1 LEVEL- L4-5;  Surgeon: [name],               MD;  Location: [facility] ORS;  Service: Neurosurgery;                Laterality: Left; No date: OOPHORECTOMY     Comment:  BSO No date: PUBO VAG SLING No date: SHOULDER SURGERY     Comment:  bilateral arthroscopies 1979:  VAGINAL HYSTERECTOMY  BMI    Body Mass Index: 29.54 kg/m      Reproductive/Obstetrics negative OB ROS                             Anesthesia Physical Anesthesia Plan  ASA: 3  Anesthesia Plan: General   Post-op Pain Management:    Induction: Intravenous  PONV Risk Score and Plan: Propofol infusion and TIVA  Airway Management Planned: Natural Airway and Nasal Cannula  Additional Equipment:   Intra-op Plan:   Post-operative Plan:   Informed Consent: I have reviewed the patients History and Physical, chart, labs and discussed the procedure including the risks, benefits and alternatives for the proposed anesthesia with the patient or  authorized representative who has indicated his/her understanding and acceptance.     Dental Advisory Given  Plan Discussed with: Anesthesiologist, CRNA and Surgeon  Anesthesia Plan Comments: (Patient consented for risks of anesthesia including but not limited to:  - adverse reactions to medications - risk of airway placement if required - damage to eyes, teeth, lips or other oral mucosa - nerve damage due to positioning  - sore throat or hoarseness - Damage to heart, brain, nerves, lungs, other parts of body or loss of life  Patient voiced understanding.)        Anesthesia Quick Evaluation

## 2021-07-25 NOTE — Interval H&P Note (Signed)
History and Physical Interval Note:  07/25/2021 9:27 AM  Mckenzie Lawrence  has presented today for surgery, with the diagnosis of DYSPHAGIA.  The various methods of treatment have been discussed with the patient and family. After consideration of risks, benefits and other options for treatment, the patient has consented to  Procedure(s) with comments: ESOPHAGOGASTRODUODENOSCOPY (EGD) (N/A) - IDDM as a surgical intervention.  The patient's history has been reviewed, patient examined, no change in status, stable for surgery.  I have reviewed the patient's chart and labs.  Questions were answered to the patient's satisfaction.     [place], [place]

## 2021-07-25 NOTE — Op Note (Signed)
[facility] Gastroenterology Patient Name: Mckenzie Lawrence Procedure Date: 07/25/2021 9:17 AM MRN: [id number] Account #: [id number] Date of Birth: 05/25/1946 Admit Type: Outpatient Age: 76 Room: [facility] ENDO ROOM 2 Gender: Female Note Status: Finalized Instrument Name: Upper Endoscope [id number] Procedure:             Upper GI endoscopy Indications:           Esophageal dysphagia Providers:             Lorie Apley K. Anik Wesch MD, MD Medicines:             Propofol per Anesthesia Complications:         No immediate complications. Procedure:             Pre-Anesthesia Assessment:                        - The risks and benefits of the procedure and the                         sedation options and risks were discussed with the                         patient. All questions were answered and informed                         consent was obtained.                        - Patient identification and proposed procedure were                         verified prior to the procedure by the nurse. The                         procedure was verified in the procedure room.                        - ASA Grade Assessment: III - A patient with severe                         systemic disease.                        - After reviewing the risks and benefits, the patient                         was deemed in satisfactory condition to undergo the                         procedure.                        After obtaining informed consent, the endoscope was                         passed under direct vision. Throughout the procedure,                         the patient's blood pressure, pulse, and oxygen  saturations were monitored continuously. The Endoscope                         was introduced through the mouth, and advanced to the                         third part of duodenum. The upper GI endoscopy was                         accomplished without difficulty. The patient  tolerated                         the procedure well. Findings:      A non-bleeding diverticulum with a small opening and no stigmata of       recent bleeding was found in the distal esophagus.      LA Grade A (one or more mucosal breaks less than 5 mm, not extending       between tops of 2 mucosal folds) esophagitis with no bleeding was found       at the gastroesophageal junction. Biopsies were taken with a cold       forceps for histology.      One benign-appearing, intrinsic mild stenosis was found at the       gastroesophageal junction. This stenosis measured 1.5 cm (inner       diameter) x less than one cm (in length). The stenosis was traversed. A       TTS dilator was passed through the scope. Dilation with an 18-19-20 mm       balloon dilator was performed to 18 mm.      A 2 cm hiatal hernia was present.      Scattered mild inflammation characterized by erythema was found in the       gastric antrum.      The examined duodenum was normal.      A medium amount of food (residue) was found in the gastric fundus.      The exam was otherwise without abnormality. Impression:            - Diverticulum in the distal esophagus.                        - LA Grade A reflux esophagitis with no bleeding.                         Biopsied.                        - Benign-appearing esophageal stenosis. Dilated.                        - 2 cm hiatal hernia.                        - Gastritis.                        - Normal examined duodenum.                        - A medium amount of food (residue) in the stomach.                        -  The examination was otherwise normal. Recommendation:        - Patient has a contact number available for                         emergencies. The signs and symptoms of potential                         delayed complications were discussed with the patient.                         Return to normal activities tomorrow. Written                          discharge instructions were provided to the patient.                        - Resume previous diet.                        - Continue present medications.                        - Await pathology results.                        - Return to nurse practitioner in 6 weeks.                        - Follow up with Dawson Bills, NP at [facility]                         Gastroenterology.                        - Telephone GI clinic to schedule appointment.                        - The findings and recommendations were discussed with                         the patient. Procedure Code(s):     --- Professional ---                        [phone], Esophagogastroduodenoscopy, flexible,                         transoral; with transendoscopic balloon dilation of                         esophagus (less than 30 mm diameter)                        43239, 59, Esophagogastroduodenoscopy, flexible,                         transoral; with biopsy, single or multiple Diagnosis Code(s):     --- Professional ---                        R13.14, Dysphagia, pharyngoesophageal phase  K29.70, Gastritis, unspecified, without bleeding                        K44.9, Diaphragmatic hernia without obstruction or                         gangrene                        K22.2, Esophageal obstruction                        K21.00, Gastro-esophageal reflux disease with                         esophagitis, without bleeding                        Q39.6, Congenital diverticulum of esophagus CPT copyright 2019 American Medical Association. All rights reserved. The codes documented in this report are preliminary and upon coder review may  be revised to meet current compliance requirements. Efrain Sella MD, MD 07/25/2021 9:56:52 AM This report has been signed electronically. Number of Addenda: 0 Note Initiated On: 07/25/2021 9:17 AM Estimated Blood Loss:  Estimated blood loss: none. Estimated blood loss:                          none. Estimated blood loss was minimal.      [facility]

## 2021-07-25 NOTE — Transfer of Care (Signed)
Immediate Anesthesia Transfer of Care Note  Patient: Mckenzie Lawrence  Procedure(s) Performed: ESOPHAGOGASTRODUODENOSCOPY (EGD)  Patient Location: PACU and Endoscopy Unit  Anesthesia Type:General  Level of Consciousness: awake, drowsy and patient cooperative  Airway & Oxygen Therapy: Patient Spontanous Breathing  Post-op Assessment: Report given to RN and Post -op Vital signs reviewed and stable  Post vital signs: Reviewed and stable  Last Vitals:  Vitals Value Taken Time  BP 146/61 07/25/21 0958  Temp 36.1 C 07/25/21 0958  Pulse 77 07/25/21 0958  Resp 16 07/25/21 0958  SpO2 100 % 07/25/21 0958    Last Pain:  Vitals:   07/25/21 0958  TempSrc: Temporal  PainSc: 0-No pain         Complications: No notable events documented.

## 2021-07-25 NOTE — Anesthesia Postprocedure Evaluation (Signed)
Anesthesia Post Note  Patient: Mckenzie Lawrence  Procedure(s) Performed: ESOPHAGOGASTRODUODENOSCOPY (EGD)  Patient location during evaluation: Endoscopy Anesthesia Type: General Level of consciousness: awake and alert Pain management: pain level controlled Vital Signs Assessment: post-procedure vital signs reviewed and stable Respiratory status: spontaneous breathing, nonlabored ventilation, respiratory function stable and patient connected to nasal cannula oxygen Cardiovascular status: blood pressure returned to baseline and stable Postop Assessment: no apparent nausea or vomiting Anesthetic complications: no   No notable events documented.   Last Vitals:  Vitals:   07/25/21 0958 07/25/21 1006  BP: (!) 146/61 (!) 142/86  Pulse: 77 75  Resp: 16 11  Temp: (!) 36.1 C   SpO2: 100% 100%    Last Pain:  Vitals:   07/25/21 1006  TempSrc:   PainSc: 0-No pain                 Precious Haws [name]

## 2021-07-26 ENCOUNTER — Encounter: Payer: Self-pay | Admitting: Internal Medicine

## 2021-07-27 LAB — SURGICAL PATHOLOGY

## 2021-07-30 DIAGNOSIS — I2510 Atherosclerotic heart disease of native coronary artery without angina pectoris: Secondary | ICD-10-CM | POA: Diagnosis not present

## 2021-07-30 DIAGNOSIS — R1310 Dysphagia, unspecified: Secondary | ICD-10-CM | POA: Diagnosis not present

## 2021-07-30 DIAGNOSIS — R002 Palpitations: Secondary | ICD-10-CM | POA: Diagnosis not present

## 2021-07-30 DIAGNOSIS — J449 Chronic obstructive pulmonary disease, unspecified: Secondary | ICD-10-CM | POA: Diagnosis not present

## 2021-07-30 DIAGNOSIS — I10 Essential (primary) hypertension: Secondary | ICD-10-CM | POA: Diagnosis not present

## 2021-07-30 DIAGNOSIS — G4733 Obstructive sleep apnea (adult) (pediatric): Secondary | ICD-10-CM | POA: Diagnosis not present

## 2021-07-30 DIAGNOSIS — E785 Hyperlipidemia, unspecified: Secondary | ICD-10-CM | POA: Diagnosis not present

## 2021-07-30 DIAGNOSIS — Z720 Tobacco use: Secondary | ICD-10-CM | POA: Diagnosis not present

## 2021-07-30 DIAGNOSIS — R609 Edema, unspecified: Secondary | ICD-10-CM | POA: Diagnosis not present

## 2021-08-03 ENCOUNTER — Other Ambulatory Visit: Payer: Self-pay | Admitting: Nurse Practitioner

## 2021-08-03 DIAGNOSIS — R1310 Dysphagia, unspecified: Secondary | ICD-10-CM

## 2021-08-10 ENCOUNTER — Ambulatory Visit
Admission: RE | Admit: 2021-08-10 | Discharge: 2021-08-10 | Disposition: A | Discharge status: Home / Self Care | Payer: HMO | Source: Ambulatory Visit | Attending: Nurse Practitioner | Admitting: Nurse Practitioner

## 2021-08-10 DIAGNOSIS — R1310 Dysphagia, unspecified: Secondary | ICD-10-CM | POA: Insufficient documentation

## 2021-08-10 DIAGNOSIS — K224 Dyskinesia of esophagus: Secondary | ICD-10-CM | POA: Insufficient documentation

## 2021-08-10 DIAGNOSIS — K222 Esophageal obstruction: Secondary | ICD-10-CM | POA: Diagnosis not present

## 2021-08-28 DIAGNOSIS — M48062 Spinal stenosis, lumbar region with neurogenic claudication: Secondary | ICD-10-CM | POA: Diagnosis not present

## 2021-08-28 DIAGNOSIS — M4316 Spondylolisthesis, lumbar region: Secondary | ICD-10-CM | POA: Diagnosis not present

## 2021-08-28 DIAGNOSIS — M4317 Spondylolisthesis, lumbosacral region: Secondary | ICD-10-CM | POA: Diagnosis not present

## 2021-08-28 DIAGNOSIS — M5126 Other intervertebral disc displacement, lumbar region: Secondary | ICD-10-CM | POA: Diagnosis not present

## 2021-08-29 ENCOUNTER — Ambulatory Visit: Admission: RE | Admit: 2021-08-29 | Payer: HMO | Source: Ambulatory Visit | Admitting: Internal Medicine

## 2021-08-29 ENCOUNTER — Other Ambulatory Visit: Payer: Self-pay | Admitting: Neurosurgery

## 2021-08-29 ENCOUNTER — Encounter: Admission: RE | Payer: Self-pay | Source: Ambulatory Visit

## 2021-08-29 SURGERY — ESOPHAGOGASTRODUODENOSCOPY (EGD) WITH PROPOFOL
Anesthesia: General

## 2021-08-30 ENCOUNTER — Other Ambulatory Visit: Payer: Self-pay | Admitting: *Deleted

## 2021-08-30 DIAGNOSIS — Z01818 Encounter for other preprocedural examination: Secondary | ICD-10-CM | POA: Diagnosis not present

## 2021-08-30 DIAGNOSIS — I2510 Atherosclerotic heart disease of native coronary artery without angina pectoris: Secondary | ICD-10-CM | POA: Diagnosis not present

## 2021-08-30 DIAGNOSIS — J449 Chronic obstructive pulmonary disease, unspecified: Secondary | ICD-10-CM | POA: Diagnosis not present

## 2021-08-30 DIAGNOSIS — I10 Essential (primary) hypertension: Secondary | ICD-10-CM | POA: Diagnosis not present

## 2021-08-30 DIAGNOSIS — M79605 Pain in left leg: Secondary | ICD-10-CM | POA: Diagnosis not present

## 2021-08-30 DIAGNOSIS — E1159 Type 2 diabetes mellitus with other circulatory complications: Secondary | ICD-10-CM | POA: Diagnosis not present

## 2021-08-30 DIAGNOSIS — M79604 Pain in right leg: Secondary | ICD-10-CM | POA: Diagnosis not present

## 2021-08-30 DIAGNOSIS — M545 Low back pain, unspecified: Secondary | ICD-10-CM | POA: Diagnosis not present

## 2021-09-12 ENCOUNTER — Ambulatory Visit (HOSPITAL_COMMUNITY)
Admission: RE | Admit: 2021-09-12 | Discharge: 2021-09-12 | Disposition: A | Discharge status: Home / Self Care | Payer: HMO | Source: Ambulatory Visit | Attending: Vascular Surgery | Admitting: Vascular Surgery

## 2021-09-12 ENCOUNTER — Encounter: Payer: Self-pay | Admitting: Vascular Surgery

## 2021-09-12 ENCOUNTER — Ambulatory Visit: Payer: HMO | Admitting: Vascular Surgery

## 2021-09-12 VITALS — BP 166/71 | HR 72 | Temp 97.9°F | Resp 20 | Ht 66.0 in | Wt 193.0 lb

## 2021-09-12 DIAGNOSIS — R600 Localized edema: Secondary | ICD-10-CM | POA: Diagnosis not present

## 2021-09-12 NOTE — Progress Notes (Signed)
? ?Patient ID: Mckenzie Lawrence, female   DOB: 1945-11-06, 76 y.o.   MRN: [id number] ? ?Reason for Consult: New Patient (Initial Visit) ?  ?Referred by Maryland Pink, MD ? ?Subjective:  ?   ?HPI: ? ?Mckenzie Lawrence is a 76 y.o. female without significant history of vascular disease does have previous right greater saphenous vein ablation.  More recently she does have bilateral lower extremity swelling.  She does wear compression stockings but states these only marginally help.  She does not have any changes, tissue loss or ulceration.  She denies any history of DVT.  She has has hyponatremia which is limited her ability to take diuretics.  She is scheduled for back surgery in the near future. ? ?Past Medical History:  ?Diagnosis Date  ? Arthritis   ? Bladder incontinence   ? Broken foot   ? Cataracts, bilateral   ? Chronic kidney insufficiency   ? COPD (chronic obstructive pulmonary disease) ([place])   ? wheezing  ? CVA (cerebral vascular accident) ([place]) 2016  ? Diabetes mellitus   ? insulin dependent, Type 2  ? HLD (hyperlipidemia)   ? Hx of cardiovascular stress test   ? a. ETT (6/13):  Ex 5:13; no ischemic changes  ? Hypertension   ? controlled on meds  ? Lacunar stroke of left subthalamic region [facility]) 02/2015  ? Leg pain   ? left  ? Lower back pain   ? Neuromuscular disorder ([place])   ? stroke right hand tingling  ? Orthostatic hypotension   ? Osteopenia 01/2017  ? T score -2.0 stable from prior DEXA  ? PCOS (polycystic ovarian syndrome)   ? Personal history of tobacco use, presenting hazards to health 01/09/2015  ? Sleep apnea   ? borderline/ CPAP  ? ?Family History  ?Problem Relation Age of Onset  ? Hypertension Mother   ? Heart disease Mother 83  ?     MI  ? Diabetes Father   ? Diabetes Sister   ? Hypertension Sister   ? Diabetes Brother   ? Hypertension Brother   ? Heart disease Brother 35  ?     CAD  ? Cancer Sister   ?     Multiple myloma  ? Diabetes Brother   ? ?Past Surgical History:  ?Procedure Laterality Date  ?  BROW LIFT Bilateral 11/04/2017  ? Procedure: BLEPHAROPLASTY UPPER EYELID W/EXCESS SKIN;  Surgeon: Karle Starch, MD;  Location: [facility];  Service: Ophthalmology;  Laterality: Bilateral;  DIABETES-insulin dependent ?uses CPAP  ? CARDIAC CATHETERIZATION  20 yrs ago  ? found nothing  ? CARPAL TUNNEL RELEASE Bilateral   ? CATARACT EXTRACTION Bilateral   ? ELBOW SURGERY Bilateral   ? ESOPHAGOGASTRODUODENOSCOPY N/A 07/25/2021  ? Procedure: ESOPHAGOGASTRODUODENOSCOPY (EGD);  Surgeon: [place], Benay Pike, MD;  Location: [facility];  Service: Gastroenterology;  Laterality: N/A;  IDDM  ? FOOT SURGERY    ? Groin Abscess    ? HAND SURGERY    ? KNEE SURGERY Bilateral   ? LABIAL ABSCESS    ? LUMBAR LAMINECTOMY/DECOMPRESSION MICRODISCECTOMY Left 05/11/2018  ? Procedure: LUMBAR LAMINECTOMY/DECOMPRESSION MICRODISCECTOMY 1 LEVEL- L4-5;  Surgeon: [name], MD;  Location: [facility] ORS;  Service: Neurosurgery;  Laterality: Left;  ? OOPHORECTOMY    ? BSO  ? PUBO VAG SLING    ? SHOULDER SURGERY    ? bilateral arthroscopies  ? VAGINAL HYSTERECTOMY  1979  ? ? ?Short Social History:  ?Social History  ? ?Tobacco Use  ?  Smoking status: Every Day  ?  Packs/day: 1.50  ?  Years: 50.00  ?  Pack years: 75.00  ?  Types: Cigarettes  ? Smokeless tobacco: Never  ? Tobacco comments:  ?  3ppd x 10 year, then cut back to 1.5pdd since 02/2015  ?Substance Use Topics  ? Alcohol use: Never  ?  Alcohol/week: 0.0 standard drinks  ? ? ?Allergies  ?Allergen Reactions  ? Iodine Anaphylaxis and Other (See Comments)  ?  Pt states that she is allergic to ingested iodine only, okay for betadine.    ? Shellfish Allergy Anaphylaxis  ? Codeine Nausea And Vomiting  ? Morphine Sulfate Nausea And Vomiting  ? Irbesartan Other (See Comments)  ?  Reaction:  Unknown  (Avapro)  ? Sulfa Antibiotics Other (See Comments)  ?  Fever   ? ? ?Current Outpatient Medications  ?Medication Sig Dispense Refill  ? clopidogrel (PLAVIX) 75 MG tablet     ? metFORMIN (GLUCOPHAGE) 1000  MG tablet Take 1,000 mg by mouth 2 (two) times daily with a meal.    ? acetaminophen (TYLENOL) 500 MG tablet Take 1,000 mg by mouth every 6 (six) hours as needed for moderate pain or headache.    ? albuterol (PROVENTIL) (2.5 MG/3ML) 0.083% nebulizer solution Take 3 mLs (2.5 mg total) by nebulization every 6 (six) hours as needed for wheezing or shortness of breath. 75 mL 0  ? BD INSULIN SYRINGE U/F 31G X 5/16" 1 ML MISC USE 1 SYRINGE AS DIRECTED    ? doxycycline (VIBRAMYCIN) 100 MG capsule Take 1 capsule (100 mg total) by mouth 2 (two) times daily. 20 capsule 0  ? furosemide (LASIX) 20 MG tablet     ? imipramine (TOFRANIL) 50 MG tablet Take 25 mg by mouth every other day.    ? insulin detemir (LEVEMIR) 100 UNIT/ML injection Inject 60 Units into the skin daily.     ? LORazepam (ATIVAN) 2 MG tablet Take 1 mg by mouth every 8 (eight) hours as needed.    ? losartan (COZAAR) 100 MG tablet Take 1 tablet (100 mg total) by mouth daily. 30 tablet 2  ? metoprolol succinate (TOPROL-XL) 50 MG 24 hr tablet Take 50 mg by mouth daily.     ? Multiple Vitamins-Minerals (VISION VITAMINS PO) Take 1 capsule by mouth 2 (two) times daily. AREDS    ? nitrofurantoin, macrocrystal-monohydrate, (MACROBID) 100 MG capsule Take 100 mg by mouth at bedtime.    ? ondansetron (ZOFRAN ODT) 8 MG disintegrating tablet Take 1 tablet (8 mg total) by mouth every 8 (eight) hours as needed. (Patient not taking: Reported on 02/26/2019) 6 tablet 0  ? ONETOUCH ULTRA test strip 4 (four) times daily.    ? oxybutynin (DITROPAN) 5 MG tablet Take 5 mg by mouth daily.     ? pravastatin (PRAVACHOL) 40 MG tablet Take 40 mg by mouth daily.     ? zolpidem (AMBIEN) 10 MG tablet Take 10 mg by mouth at bedtime as needed.    ? ?No current facility-administered medications for this visit.  ? ? ?Review of Systems  ?Constitutional:  Constitutional negative. ?HENT: HENT negative.  ?Eyes: Eyes negative.  ?Respiratory: Respiratory negative.  ?Cardiovascular: Positive for leg  swelling.  ?GI: Gastrointestinal negative.  ?Musculoskeletal: Positive for back pain and gait problem.  ?Skin: Skin negative.  ?Hematologic: Hematologic/lymphatic negative.  ?Psychiatric: Psychiatric negative.   ? ?   ?Objective:  ?Objective  ?Vitals:  ? 09/12/21 1348  ?BP: (!) 166/71  ?Pulse: 72  ?  Resp: 20  ?Temp: 97.9 ?F (36.6 ?C)  ?SpO2: 97%  ? ? ? ?Physical Exam ?HENT:  ?   Head: Normocephalic.  ?   Nose: Nose normal.  ?   Mouth/Throat:  ?   Mouth: Mucous membranes are moist.  ?Eyes:  ?   Pupils: Pupils are equal, round, and reactive to light.  ?Cardiovascular:  ?   Pulses:     ?     Radial pulses are 2+ on the right side and 2+ on the left side.  ?Pulmonary:  ?   Effort: Pulmonary effort is normal.  ?Abdominal:  ?   General: Abdomen is flat.  ?   Palpations: Abdomen is soft.  ?Musculoskeletal:  ?   Right lower leg: Edema present.  ?   Left lower leg: Edema present.  ?Skin: ?   General: Skin is warm.  ?   Capillary Refill: Capillary refill takes less than 2 seconds.  ?Neurological:  ?   General: No focal deficit present.  ?   Mental Status: She is alert.  ?Psychiatric:     ?   Mood and Affect: Mood normal.     ?   Behavior: Behavior normal.     ?   Thought Content: Thought content normal.  ? ? ?Data: ?Venous Reflux Times  ?+--------------+---------+------+-----------+------------+-----------------  ?----+  ?RIGHT         Reflux NoRefluxReflux TimeDiameter cmsComments          ?      ?                        Yes                                           ?      ?+--------------+---------+------+-----------+------------+-----------------  ?----+  ?CFV           no                                                      ?      ?+--------------+---------+------+-----------+------------+-----------------  ?----+  ?FV mid        no                                                      ?      ?+--------------+---------+------+-----------+------------+-----------------  ?----+  ?Popliteal                yes   >1 second                               ?      ?+--------------+---------+------+-----------+------------+-----------------  ?----+  ?GSV at [facility]                                          not visualized -  ?      ?

## 2021-09-14 DIAGNOSIS — K219 Gastro-esophageal reflux disease without esophagitis: Secondary | ICD-10-CM | POA: Diagnosis not present

## 2021-09-14 DIAGNOSIS — R1310 Dysphagia, unspecified: Secondary | ICD-10-CM | POA: Diagnosis not present

## 2021-09-18 ENCOUNTER — Other Ambulatory Visit: Payer: Self-pay | Admitting: Neurosurgery

## 2021-09-19 ENCOUNTER — Ambulatory Visit (INDEPENDENT_AMBULATORY_CARE_PROVIDER_SITE_OTHER): Payer: HMO | Admitting: Podiatry

## 2021-09-19 ENCOUNTER — Encounter: Payer: Self-pay | Admitting: Podiatry

## 2021-09-19 DIAGNOSIS — N1832 Chronic kidney disease, stage 3b: Secondary | ICD-10-CM | POA: Diagnosis not present

## 2021-09-19 DIAGNOSIS — B351 Tinea unguium: Secondary | ICD-10-CM | POA: Diagnosis not present

## 2021-09-19 DIAGNOSIS — E1142 Type 2 diabetes mellitus with diabetic polyneuropathy: Secondary | ICD-10-CM

## 2021-09-19 DIAGNOSIS — E1122 Type 2 diabetes mellitus with diabetic chronic kidney disease: Secondary | ICD-10-CM | POA: Diagnosis not present

## 2021-09-19 DIAGNOSIS — M79676 Pain in unspecified toe(s): Secondary | ICD-10-CM

## 2021-09-19 NOTE — Progress Notes (Signed)
She presents today for follow-up of her diabetes diabetic peripheral neuropathy and painful elongated toenails.  She denies any changes.  States that she is going to have back surgery April 17 by Dr. Arnoldo Morale in [place]. ? ?Objective: Vital signs are stable alert and oriented x3 there is no erythema edema cellulitis drainage or odor sensation to the bilateral lower extremities diminished per Semmes Weinstein monofilament.  Toenails are long thick yellow dystrophic. ? ?Assessment: Diabetes mellitus diabetic peripheral neuropathy.  Pain in limb secondary to onychomycosis. ? ?Plan: Debridement of toenails 1 through 5 bilateral. ?

## 2021-09-19 NOTE — Progress Notes (Signed)
Surgical Instructions ? ? ? Your procedure is scheduled on Monday April 17th. ? Report to [facility] Main Entrance "A" at 5:30 A.M., then check in with the Admitting office. ? Call this number if you have problems the morning of surgery: ? [phone] ? ? If you have any questions prior to your surgery date call [phone]: Open Monday-Friday 8am-4pm ? ? ? Remember: ? Do not eat or drink after midnight the night before your surgery ? ?  ? Take these medicines the morning of surgery with A SIP OF WATER: ?ANORO ELLIPTA 62.5-25 MCG/ACT - please bring with you to the hospital ?calcitRIOL (ROCALTROL) 0.25 MCG capsule ?hydrALAZINE (APRESOLINE) 25 MG tablet ?metoprolol succinate (TOPROL-XL) 100 MG 24 hr tablet ?pantoprazole (PROTONIX) 40 MG tablet ?rosuvastatin (CRESTOR) 5 MG tablet ? ?IF NEEDED  ?acetaminophen (TYLENOL) 500 MG tablet ?albuterol (PROVENTIL) (2.5 MG/3ML) 0.083% nebulizer solution ?albuterol (VENTOLIN HFA) 108 (90 Base) MCG/ACT inhaler - please bring with you to the hospital ?LORazepam (ATIVAN) 2 MG tablet ?oxyCODONE-acetaminophen (PERCOCET/ROXICET) 5-325 MG tablet ?promethazine (PHENERGAN) 25 MG tablet ? ?As of today, STOP taking any Aspirin (unless otherwise instructed by your surgeon) Aleve, Naproxen, Ibuprofen, Motrin, Advil, Goody's, BC's, all herbal medications, fish oil, and all vitamins. ? ? ? ?WHAT DO I DO ABOUT MY DIABETES MEDICATION? ? ? ?Do not take oral diabetes medicines (pills) the morning of surgery. ? ?THE DAY BEFORE SURGERY, take 100% of your morning dose of Levemir insulin. Take 50% of your evening dose of Levemir insulin.    ? ?THE MORNING OF SURGERY, take 50% of your dose of Levemir insulin. ? ?The day of surgery, do not take other diabetes injectables, including Byetta (exenatide), Bydureon (exenatide ER), Victoza (liraglutide), or Trulicity (dulaglutide). ? ?If your CBG is greater than 220 mg/dL, you may take ? of your sliding scale (correction) dose of insulin. ? ? ?HOW TO MANAGE  YOUR DIABETES ?BEFORE AND AFTER SURGERY ? ?Why is it important to control my blood sugar before and after surgery? ?Improving blood sugar levels before and after surgery helps healing and can limit problems. ?A way of improving blood sugar control is eating a healthy diet by: ? Eating less sugar and carbohydrates ? Increasing activity/exercise ? Talking with your doctor about reaching your blood sugar goals ?High blood sugars (greater than 180 mg/dL) can raise your risk of infections and slow your recovery, so you will need to focus on controlling your diabetes during the weeks before surgery. ?Make sure that the doctor who takes care of your diabetes knows about your planned surgery including the date and location. ? ?How do I manage my blood sugar before surgery? ?Check your blood sugar at least 4 times a day, starting 2 days before surgery, to make sure that the level is not too high or low. ? ?Check your blood sugar the morning of your surgery when you wake up and every 2 hours until you get to the Short Stay unit. ? ?If your blood sugar is less than 70 mg/dL, you will need to treat for low blood sugar: ?Do not take insulin. ?Treat a low blood sugar (less than 70 mg/dL) with ? cup of clear juice (cranberry or apple), 4 glucose tablets, OR glucose gel. ?Recheck blood sugar in 15 minutes after treatment (to make sure it is greater than 70 mg/dL). If your blood sugar is not greater than 70 mg/dL on recheck, call [phone] for further instructions. ?Report your blood sugar to the short stay nurse when you get to  Short Stay. ? ?If you are admitted to the hospital after surgery: ?Your blood sugar will be checked by the staff and you will probably be given insulin after surgery (instead of oral diabetes medicines) to make sure you have good blood sugar levels. ?The goal for blood sugar control after surgery is 80-180 mg/dL. ? ? ?DAY OF SURGERY ?         ?Do not wear jewelry or makeup ?Do not wear lotions, powders,  perfumes, or deodorant. ?Do not shave 48 hours prior to surgery.   ?Do not bring valuables to the hospital. ?Do not wear nail polish, gel polish, artificial nails, or any other type of covering on natural nails (fingers and toes) ?If you have artificial nails or gel coating that need to be removed by a nail salon, please have this removed prior to surgery. Artificial nails or gel coating may interfere with anesthesia's ability to adequately monitor your vital signs. ? ?[facility] is not responsible for any belongings or valuables. .  ? ?Do NOT Smoke (Tobacco/Vaping)  24 hours prior to your procedure ? ?If you use a CPAP at night, you may bring your mask for your overnight stay. ?  ?Contacts, glasses, hearing aids, dentures or partials may not be worn into surgery, please bring cases for these belongings ?  ?For patients admitted to the hospital, discharge time will be determined by your treatment team. ?  ?Patients discharged the day of surgery will not be allowed to drive home, and someone needs to stay with them for 24 hours. ? ? ?SURGICAL WAITING ROOM VISITATION ?Patients having surgery or a procedure in a hospital may have two support people. ?Children under the age of 37 must have an adult with them who is not the patient. ?They may stay in the waiting area during the procedure and may switch out with other visitors. If the patient needs to stay at the hospital during part of their recovery, the visitor guidelines for inpatient rooms apply. ? ?Please refer to the [facility] website for the visitor guidelines for Inpatients (after your surgery is over and you are in a regular room).  ? ? ? ? ? ?Special instructions:   ? ?Oral Hygiene is also important to reduce your risk of infection.  Remember - BRUSH YOUR TEETH THE MORNING OF SURGERY WITH YOUR REGULAR TOOTHPASTE ? ? ?[facility]- Preparing For Surgery ? ?Before surgery, you can play an important role. Because skin is not sterile, your skin needs to be as free  of germs as possible. You can reduce the number of germs on your skin by washing with CHG (chlorahexidine gluconate) Soap before surgery.  CHG is an antiseptic cleaner which kills germs and bonds with the skin to continue killing germs even after washing.   ? ? ?Please do not use if you have an allergy to CHG or antibacterial soaps. If your skin becomes reddened/irritated stop using the CHG.  ?Do not shave (including legs and underarms) for at least 48 hours prior to first CHG shower. It is OK to shave your face. ? ?Please follow these instructions carefully. ?  ? ? Shower the NIGHT BEFORE SURGERY and the MORNING OF SURGERY with CHG Soap.  ? If you chose to wash your hair, wash your hair first as usual with your normal shampoo. After you shampoo, rinse your hair and body thoroughly to remove the shampoo.  Then [employer] and genitals (private parts) with your normal soap and rinse thoroughly to remove  soap. ? ?After that Use CHG Soap as you would any other liquid soap. You can apply CHG directly to the skin and wash gently with a scrungie or a clean washcloth.  ? ?Apply the CHG Soap to your body ONLY FROM THE NECK DOWN.  Do not use on open wounds or open sores. Avoid contact with your eyes, ears, mouth and genitals (private parts). Wash Face and genitals (private parts)  with your normal soap.  ? ?Wash thoroughly, paying special attention to the area where your surgery will be performed. ? ?Thoroughly rinse your body with warm water from the neck down. ? ?DO NOT shower/wash with your normal soap after using and rinsing off the CHG Soap. ? ?Pat yourself dry with a CLEAN TOWEL. ? ?Wear CLEAN PAJAMAS to bed the night before surgery ? ?Place CLEAN SHEETS on your bed the night before your surgery ? ?DO NOT SLEEP WITH PETS. ? ? ?Day of Surgery: ? ?Take a shower with CHG soap. ?Wear Clean/Comfortable clothing the morning of surgery ?Do not apply any deodorants/lotions.   ?Remember to brush your teeth WITH YOUR REGULAR  TOOTHPASTE. ? ? ? ?If you received a COVID test during your pre-op visit  it is requested that you wear a mask when out in public, stay away from anyone that may not be feeling well and notify your surgeon

## 2021-09-20 ENCOUNTER — Encounter (HOSPITAL_COMMUNITY)
Admission: RE | Admit: 2021-09-20 | Discharge: 2021-09-20 | Disposition: A | Discharge status: Home / Self Care | Payer: HMO | Source: Ambulatory Visit | Attending: Neurosurgery | Admitting: Neurosurgery

## 2021-09-20 ENCOUNTER — Encounter (HOSPITAL_COMMUNITY): Payer: Self-pay

## 2021-09-20 ENCOUNTER — Other Ambulatory Visit: Payer: Self-pay

## 2021-09-20 VITALS — BP 199/71 | HR 72 | Temp 98.5°F | Resp 18 | Ht 66.0 in | Wt 191.4 lb

## 2021-09-20 DIAGNOSIS — I129 Hypertensive chronic kidney disease with stage 1 through stage 4 chronic kidney disease, or unspecified chronic kidney disease: Secondary | ICD-10-CM | POA: Insufficient documentation

## 2021-09-20 DIAGNOSIS — F17210 Nicotine dependence, cigarettes, uncomplicated: Secondary | ICD-10-CM | POA: Diagnosis not present

## 2021-09-20 DIAGNOSIS — Z8673 Personal history of transient ischemic attack (TIA), and cerebral infarction without residual deficits: Secondary | ICD-10-CM | POA: Diagnosis not present

## 2021-09-20 DIAGNOSIS — J432 Centrilobular emphysema: Secondary | ICD-10-CM | POA: Insufficient documentation

## 2021-09-20 DIAGNOSIS — E1136 Type 2 diabetes mellitus with diabetic cataract: Secondary | ICD-10-CM | POA: Diagnosis not present

## 2021-09-20 DIAGNOSIS — I2510 Atherosclerotic heart disease of native coronary artery without angina pectoris: Secondary | ICD-10-CM | POA: Insufficient documentation

## 2021-09-20 DIAGNOSIS — Z794 Long term (current) use of insulin: Secondary | ICD-10-CM | POA: Insufficient documentation

## 2021-09-20 DIAGNOSIS — Z01812 Encounter for preprocedural laboratory examination: Secondary | ICD-10-CM | POA: Diagnosis not present

## 2021-09-20 DIAGNOSIS — G4733 Obstructive sleep apnea (adult) (pediatric): Secondary | ICD-10-CM | POA: Insufficient documentation

## 2021-09-20 DIAGNOSIS — E1122 Type 2 diabetes mellitus with diabetic chronic kidney disease: Secondary | ICD-10-CM | POA: Diagnosis not present

## 2021-09-20 DIAGNOSIS — Z9989 Dependence on other enabling machines and devices: Secondary | ICD-10-CM | POA: Insufficient documentation

## 2021-09-20 DIAGNOSIS — Z01818 Encounter for other preprocedural examination: Secondary | ICD-10-CM

## 2021-09-20 DIAGNOSIS — N1832 Chronic kidney disease, stage 3b: Secondary | ICD-10-CM | POA: Diagnosis not present

## 2021-09-20 DIAGNOSIS — E785 Hyperlipidemia, unspecified: Secondary | ICD-10-CM | POA: Diagnosis not present

## 2021-09-20 DIAGNOSIS — M4316 Spondylolisthesis, lumbar region: Secondary | ICD-10-CM | POA: Insufficient documentation

## 2021-09-20 DIAGNOSIS — Z79899 Other long term (current) drug therapy: Secondary | ICD-10-CM | POA: Diagnosis not present

## 2021-09-20 LAB — TYPE AND SCREEN
ABO/RH(D): A POS
Antibody Screen: NEGATIVE

## 2021-09-20 LAB — GLUCOSE, CAPILLARY: Glucose-Capillary: 181 mg/dL — ABNORMAL HIGH (ref 70–99)

## 2021-09-20 NOTE — Progress Notes (Signed)
PCP - Dr. Maryland Pink ?Cardiologist - Dr. Isaias Cowman ?Neurologist: Dr. Jennings Books ?Nephrologist: Dr. Anthonette Legato ?Pulmonologist: Dr. Wallene Huh ? ?PPM/ICD - Denies ? ?Chest x-ray - N/A ?EKG - 04/09/21 - CE (requested) ?Stress Test - 12/06/2011 ?ECHO - 03/16/15 ?Cardiac Cath - 20 years ago ? ?Sleep Study - Yes, OSA ?CPAP - Yes ? ?Fasting Blood Sugar - 60-80's ?Checks Blood Sugar as often as needed - CGM ? ? ?Blood Thinner Instructions: LD Plavix 09/25/21 ?Aspirin Instructions: N/A ? ?ERAS Protcol - No ? ?COVID TEST- N/A ? ? ?Anesthesia review: Yes, cardiac hx; EKG requested. ? ?Patient denies shortness of breath, fever, cough and chest pain at PAT appointment ? ? ?All instructions explained to the patient, with a verbal understanding of the material. Patient agrees to go over the instructions while at home for a better understanding. Patient also instructed to self quarantine after being tested for COVID-19. The opportunity to ask questions was provided. ? ? ?

## 2021-09-20 NOTE — Progress Notes (Signed)
Surgical Instructions ? ? ? Your procedure is scheduled on Monday October 01, 2021 at 7:30 AM. ? Report to [facility] Main Entrance "A" at 5:30 A.M., then check in with the Admitting office. ? Call this number if you have problems the morning of surgery: ? [phone] ? ? If you have any questions prior to your surgery date call [phone]: Open Monday-Friday 8am-4pm ? ? ? Remember: ? Do not eat or drink after midnight the night before your surgery ? ?  ? Take these medicines the morning of surgery with A SIP OF WATER: ? ?ANORO ELLIPTA 62.5-25 MCG/ACT ?calcitRIOL (ROCALTROL) 0.25 MCG capsule ?hydrALAZINE (APRESOLINE) 25 MG tablet ?metoprolol succinate (TOPROL-XL) 100 MG 24 hr tablet ?pantoprazole (PROTONIX) 40 MG tablet ?rosuvastatin (CRESTOR) 5 MG tablet ? ?IF NEEDED  ?acetaminophen (TYLENOL) 500 MG tablet ?albuterol (PROVENTIL) (2.5 MG/3ML) 0.083% nebulizer solution ?albuterol (VENTOLIN HFA) 108 (90 Base) MCG/ACT inhaler ?LORazepam (ATIVAN) 2 MG tablet ?oxyCODONE-acetaminophen (PERCOCET/ROXICET) 5-325 MG tablet ?promethazine (PHENERGAN) 25 MG tablet ? ?Please bring all inhalers with you the day of surgery.  ? ? ?As of today, STOP taking any Aspirin (unless otherwise instructed by your surgeon) Aleve, Naproxen, Ibuprofen, Motrin, Advil, Goody's, BC's, all herbal medications, fish oil, and all vitamins. ? ? ? ?WHAT DO I DO ABOUT MY DIABETES MEDICATION? ? ?Take your usual dosage of insulin detemir (LEVEMIR) the morning before surgery. ? ?THE NIGHT BEFORE SURGERY, take 15-30 units of your insulin detemir (LEVEMIR)  ? ?OR ? ?THE MORNING OF SURGERY, take 15-30 units of your insulin detemir (LEVEMIR). ? ? ?HOW TO MANAGE YOUR DIABETES ?BEFORE AND AFTER SURGERY ? ?Why is it important to control my blood sugar before and after surgery? ?Improving blood sugar levels before and after surgery helps healing and can limit problems. ?A way of improving blood sugar control is eating a healthy diet by: ? Eating less sugar and  carbohydrates ? Increasing activity/exercise ? Talking with your doctor about reaching your blood sugar goals ?High blood sugars (greater than 180 mg/dL) can raise your risk of infections and slow your recovery, so you will need to focus on controlling your diabetes during the weeks before surgery. ?Make sure that the doctor who takes care of your diabetes knows about your planned surgery including the date and location. ? ?How do I manage my blood sugar before surgery? ?Check your blood sugar at least 4 times a day, starting 2 days before surgery, to make sure that the level is not too high or low. ? ?Check your blood sugar the morning of your surgery when you wake up and every 2 hours until you get to the Short Stay unit. ? ?If your blood sugar is less than 70 mg/dL, you will need to treat for low blood sugar: ?Do not take insulin. ?Treat a low blood sugar (less than 70 mg/dL) with ? cup of clear juice (cranberry or apple), 4 glucose tablets, OR glucose gel. ?Recheck blood sugar in 15 minutes after treatment (to make sure it is greater than 70 mg/dL). If your blood sugar is not greater than 70 mg/dL on recheck, call [phone] for further instructions. ?Report your blood sugar to the short stay nurse when you get to Short Stay. ? ?If you are admitted to the hospital after surgery: ?Your blood sugar will be checked by the staff and you will probably be given insulin after surgery (instead of oral diabetes medicines) to make sure you have good blood sugar levels. ?The goal for blood sugar control after  surgery is 80-180 mg/dL. ? ? ?Do NOT Smoke (Tobacco/Vaping)  24 hours prior to your procedure ? ?If you use a CPAP at night, you may bring your mask for your overnight stay. ?  ?Contacts, glasses, hearing aids, dentures or partials may not be worn into surgery, please bring cases for these belongings ?  ?For patients admitted to the hospital, discharge time will be determined by your treatment team. ?  ?Patients  discharged the day of surgery will not be allowed to drive home, and someone needs to stay with them for 24 hours. ? ? ?SURGICAL WAITING ROOM VISITATION ?Patients having surgery or a procedure in a hospital may have two support people. ?Children under the age of 67 must have an adult with them who is not the patient. ?They may stay in the waiting area during the procedure and may switch out with other visitors. If the patient needs to stay at the hospital during part of their recovery, the visitor guidelines for inpatient rooms apply. ? ?Please refer to the [facility] website for the visitor guidelines for Inpatients (after your surgery is over and you are in a regular room).  ? ? ?Special instructions:   ? ?Oral Hygiene is also important to reduce your risk of infection.  Remember - BRUSH YOUR TEETH THE MORNING OF SURGERY WITH YOUR REGULAR TOOTHPASTE ? ? ?[facility]- Preparing For Surgery ? ?Before surgery, you can play an important role. Because skin is not sterile, your skin needs to be as free of germs as possible. You can reduce the number of germs on your skin by washing with CHG (chlorahexidine gluconate) Soap before surgery.  CHG is an antiseptic cleaner which kills germs and bonds with the skin to continue killing germs even after washing.   ? ? ?Please do not use if you have an allergy to CHG or antibacterial soaps. If your skin becomes reddened/irritated stop using the CHG.  ?Do not shave (including legs and underarms) for at least 48 hours prior to first CHG shower. It is OK to shave your face. ? ?Please follow these instructions carefully. ?  ? ? Shower the NIGHT BEFORE SURGERY and the MORNING OF SURGERY with CHG Soap.  ? If you chose to wash your hair, wash your hair first as usual with your normal shampoo. After you shampoo, rinse your hair and body thoroughly to remove the shampoo.  Then [employer] and genitals (private parts) with your normal soap and rinse thoroughly to remove soap. ? ?After that  Use CHG Soap as you would any other liquid soap. You can apply CHG directly to the skin and wash gently with a scrungie or a clean washcloth.  ? ?Apply the CHG Soap to your body ONLY FROM THE NECK DOWN.  Do not use on open wounds or open sores. Avoid contact with your eyes, ears, mouth and genitals (private parts). Wash Face and genitals (private parts)  with your normal soap.  ? ?Wash thoroughly, paying special attention to the area where your surgery will be performed. ? ?Thoroughly rinse your body with warm water from the neck down. ? ?DO NOT shower/wash with your normal soap after using and rinsing off the CHG Soap. ? ?Pat yourself dry with a CLEAN TOWEL. ? ?Wear CLEAN PAJAMAS to bed the night before surgery ? ?Place CLEAN SHEETS on your bed the night before your surgery ? ?DO NOT SLEEP WITH PETS. ? ? ?Day of Surgery: ? ?Take a shower with CHG soap. ?         ?  Do not wear jewelry or makeup ?Do not wear lotions, powders, perfumes, or deodorant. ?Do not shave 48 hours prior to surgery.   ?Do not bring valuables to the hospital. ?Do not wear nail polish, gel polish, artificial nails, or any other type of covering on natural nails (fingers and toes) ?If you have artificial nails or gel coating that need to be removed by a nail salon, please have this removed prior to surgery. Artificial nails or gel coating may interfere with anesthesia's ability to adequately monitor your vital signs. ? ?[facility] is not responsible for any belongings or valuables. ?Wear Clean/Comfortable clothing the morning of surgery ?Do not apply any deodorants/lotions.   ?Remember to brush your teeth WITH YOUR REGULAR TOOTHPASTE. ? ? ? ?If you received a COVID test during your pre-op visit  it is requested that you wear a mask when out in public, stay away from anyone that may not be feeling well and notify your surgeon if you develop symptoms. If you have been in contact with anyone that has tested positive in the last 10 days please notify  you surgeon. ? ?  ?Please read over the following fact sheets that you were given.  ? ?

## 2021-09-21 NOTE — Progress Notes (Addendum)
Anesthesia Chart Review: ? Case: [id number] Date/Time: 10/01/21 0715  ? Procedure: PLIF,IP,POSTERIOR INSTRUMENTATION L45, L5S1, REDO L34 DISCECTOMY  ? Anesthesia type: General  ? Pre-op diagnosis: SPONDYLOLISTHESIS, LUMBAR REGION  ? Location: [facility] ROOM 18 / [facility] OR  ? Surgeons: Newman Pies, MD  ? ?  ? ? ?DISCUSSION: Patient is a 76 year old female scheduled for the above procedure. ? ?History includes smoking, COPD, HTN (and hypotension), HLD, DM2, OSA (uses CPAP), CKD (IIIB), CVA (02/2015), PCOS, spinal surgery (L4-5 microdiscectomy 05/11/18), varicose veins (s/p right GSV ablation).  ? ?Primary care visit on 08/30/2021 with Dr. Kary Kos.  He is aware of surgery plans.  BP then 172/88. Regarding preoperative evaluation he wrote,  ?"Preoperative clearance ?Feel that patient will be at moderate risk for the surgery due to her hypertension, diabetes, COPD, coronary artery disease, persistent smoking. Would recommend cardiology clearance for surgery." ? ?Evaluated by Sharolyn Douglas at [facility] on 07/30/21.  Hydralazine 25 mg 3 times daily added for hypertension.  Pravastatin continued for HLD.  Smoking cessation, Mediterranean diet, and regular exercise recommended.  Follow-up in 4 months plan. ? ?Last visit with pulmonologist Dr. Raul Del was on 06/21/21.  COPD documented as "mild COPD, stage II". She was not yet ready to stop smoking.  Continue as needed albuterol and add Anoro 1 puff daily.  Smoking cessation and weight loss recommended.  Follow-up 6 months planned.  Continue CPAP at current settings. ? ?Last visit with nephrologist Dr. Holley Raring was on 05/22/21.  EGFR had improved a bit.  Losartan continued for renal protection and protein urea suppression.  Maintain calcitriol for secondary hyperparathyroidism.  Hemoglobin stable at 10.7 with no indication for Epogen at that time.  11-monthfollow-up planned.  Her creatinine ~ 1.8-2.2 since 09/2020. ? ?Dr. HKary Koshad recommended cardiology clearance. Will  follow-up with Dr. JAdline Mangooffice. She did at least have fairly recent cardiology follow-up with me in February.  Overall seem to be stable at that time although hydralazine added for hypertension. (UPDATE 09/24/21 12:01 PM: Cardiac clearance note from Dr. PSaralyn Pilarreceived. He classified her as "Low Risk" with permission to hold ASA and Plavix 7 days prior to surgery. ) ? ?She reported instructions to hold Plavix starting 09/25/21.  She had a renal function panel, CBC with differential as part of 09/19/2021 labs with nephrology.  These results can be viewed in [facility]  Her type and screen was done at her PAT visit.  These results appear overall stable and should be acceptable for OR.  A1c was 7.3% in January 2023. ? ? ?VS: BP (!) 199/71   Pulse 72   Temp 36.9 ?C (Oral)   Resp 18   Ht '5\' 6"'  (1.676 m)   Wt 86.8 kg   SpO2 100%   BMI 30.89 kg/m?  ?BP Readings from Last 3 Encounters:  ?09/20/21 (!) 199/71  ?09/12/21 (!) 166/71  ?07/25/21 (!) 164/56  ?I don't see that her BP was rechecked at PAT. Previous recent readings were primarily in the 160's/60's. BP 168/68 at 08/28/21 visit with neurosurgery. BP medications include Toprol XL 100 mg daily, Losartan 100 mg daily, hydralazine 25 mg 3 times daily, and torsemide 40 mg daily as needed for fluid. ? ? ?PROVIDERS: ?HMaryland Pink MD is PCP  ?PIsaias Cowman MD is cardiologist  ?LAnthonette Legato MD is nephrologist ?FWallene Huh MD is pulmonologist ?TOlean Ree MD is GI ?SJennings Books MD is neurologist ? ? ?LABS: She has labs drawn on 09/19/21 (see [facility] Everywhere: ?  Labs include renal function panel, CBC with diff, intact PTH, urine protein. Results include glucose 143, BUN 18, creatinine 2.18, EGFR 23, sodium 132, potassium 4.8, chloride 100, bicarbonate 23, calcium 8.9, phosphorus 4.7, albumin 3.2, WBC 11.9, hemoglobin 10.9, hematocrit 32.9, platelet count 282, PTH 196. A1c 7.3%, AST 10, ALT 8 on 07/13/21 ([facility]).  ? ?Labs  Reviewed  ?GLUCOSE, CAPILLARY - Abnormal; Notable for the following components:  ?    Result Value  ? Glucose-Capillary 181 (*)   ? All other components within normal limits  ?TYPE AND SCREEN  ? ?Creatinine ~ 1.8-2.2 range since ~ 09/2020. ? ? ?IMAGES: ?CT Chest (lung cancer screen) 03/19/21: ?IMPRESSION: ?1. Lung-RADS 0, incomplete. Repeat lung cancer screening chest CT is ?recommended in 1-2 months after resolution of any acute symptoms, as ?today's study is limited by combination of patient respiratory ?motion and what appears to be mild multilobar bilateral ?bronchopneumonia. ?2. Aortic atherosclerosis, in addition to left main and 3 vessel ?coronary artery disease. Assessment for potential risk factor ?modification, dietary therapy or pharmacologic therapy may be ?warranted, if clinically indicated. ?3. Mild diffuse bronchial wall thickening with mild centrilobular ?and paraseptal emphysema; imaging findings suggestive of underlying ?COPD. ?- Aortic Atherosclerosis (ICD10-I70.0) and Emphysema (ICD10-J43.9). ?  ?MRI L-spine 01/16/21: ?IMPRESSION: ?1. Transitional S1 vertebra with rudimentary S1-2 disc space. ?2. Advanced lumbar spine degeneration with L4-5 and L5-S1 ?anterolisthesis. ?3. L4-5 advanced spinal and left foraminal stenosis. There is active ?right facet arthritis and discogenic endplate edema at this level. ?4. L3-4 moderate spinal stenosis with asymmetric left subarticular ?recess impingement due to disc herniation. ? ? ?EKG: 03/19/21 ([facility]): NSR ? ? ?CV: ?Echo 08/06/18 ([facility]) ?INTERPRETATION  ?NORMAL LEFT VENTRICULAR SYSTOLIC FUNCTION   WITH MILD LVH  ?NORMAL RIGHT VENTRICULAR SYSTOLIC FUNCTION  ?TRIVIAL REGURGITATION NOTED (See above)  ?NO VALVULAR STENOSIS  ?TRIVIAL MR, TR  ?EF 60%  ? ?Per [facility] notes: ?- 72-hour Holter monitor (03/19/2021 - 03/22/2021) revealed predominant sinus rhythm with mean heart rate of 83 bpm, heart rate range 62 to 176 bpm, episodes of sinus tachycardia as well  as occasional premature atrial contractions. ? ?- CAD (coronary artery disease) 20% LAD, 40% RCA stenosis. Other vessels normal.  ?mild, 2001 noted to have 20% LAD, 40% RCA stenosis, other vessels normal  ? ? ?US Carotid 04/26/16: ?Impression: ?Doppler velocities suggest 1 to 39% bilateral proximal internal carotid artery stenoses. ? ?ETT 12/06/11: ?Recommendations:  ?Negative adequate ETT.  No further testing is indicated.  ? ? ?Past Medical History:  ?Diagnosis Date  ? Arthritis   ? Bladder incontinence   ? Broken foot   ? Cataracts, bilateral   ? Chronic kidney insufficiency   ? COPD (chronic obstructive pulmonary disease) ([place])   ? wheezing  ? CVA (cerebral vascular accident) ([place]) 2016  ? Diabetes mellitus   ? insulin dependent, Type 2  ? HLD (hyperlipidemia)   ? Hx of cardiovascular stress test   ? a. ETT (6/13):  Ex 5:13; no ischemic changes  ? Hypertension   ? controlled on meds  ? Lacunar stroke of left subthalamic region [facility]) 02/2015  ? Leg pain   ? left  ? Lower back pain   ? Neuromuscular disorder ([place])   ? stroke right hand tingling  ? Orthostatic hypotension   ? Osteopenia 01/2017  ? T score -2.0 stable from prior DEXA  ? PCOS (polycystic ovarian syndrome)   ? Personal history of tobacco use, presenting hazards to health 01/09/2015  ? Sleep  apnea   ? borderline/ CPAP  ? ? ?Past Surgical History:  ?Procedure Laterality Date  ? BROW LIFT Bilateral 11/04/2017  ? Procedure: BLEPHAROPLASTY UPPER EYELID W/EXCESS SKIN;  Surgeon: Karle Starch, MD;  Location: [facility];  Service: Ophthalmology;  Laterality: Bilateral;  DIABETES-insulin dependent ?uses CPAP  ? CARDIAC CATHETERIZATION  20 yrs ago  ? found nothing  ? CARPAL TUNNEL RELEASE Bilateral   ? CATARACT EXTRACTION Bilateral   ? ELBOW SURGERY Bilateral   ? ESOPHAGOGASTRODUODENOSCOPY N/A 07/25/2021  ? Procedure: ESOPHAGOGASTRODUODENOSCOPY (EGD);  Surgeon: [place], Benay Pike, MD;  Location: [facility];  Service: Gastroenterology;  Laterality: N/A;   IDDM  ? FOOT SURGERY    ? Groin Abscess    ? HAND SURGERY    ? KNEE SURGERY Bilateral   ? LABIAL ABSCESS    ? LUMBAR LAMINECTOMY/DECOMPRESSION MICRODISCECTOMY Left 05/11/2018  ? Procedure: LUMBAR LAMINECTOMY/DECOMPRES

## 2021-09-24 DIAGNOSIS — E1122 Type 2 diabetes mellitus with diabetic chronic kidney disease: Secondary | ICD-10-CM | POA: Diagnosis not present

## 2021-09-24 DIAGNOSIS — N184 Chronic kidney disease, stage 4 (severe): Secondary | ICD-10-CM | POA: Diagnosis not present

## 2021-09-24 DIAGNOSIS — N2581 Secondary hyperparathyroidism of renal origin: Secondary | ICD-10-CM | POA: Diagnosis not present

## 2021-09-24 DIAGNOSIS — D631 Anemia in chronic kidney disease: Secondary | ICD-10-CM | POA: Diagnosis not present

## 2021-09-24 DIAGNOSIS — I10 Essential (primary) hypertension: Secondary | ICD-10-CM | POA: Diagnosis not present

## 2021-09-24 NOTE — Anesthesia Preprocedure Evaluation (Addendum)
Anesthesia Evaluation  ?Patient identified by MRN, date of birth, ID band ?Patient awake ? ? ? ?Reviewed: ?Allergy & Precautions, NPO status , Patient's Chart, lab work & pertinent test results, reviewed documented beta blocker date and time  ? ?History of Anesthesia Complications ?Negative for: history of anesthetic complications ? ?Airway ?Mallampati: II ? ?TM Distance: >3 FB ?Neck ROM: Full ? ? ? Dental ? ?(+) Dental Advisory Given, Implants ?  ?Pulmonary ?sleep apnea , COPD,  COPD inhaler, Current SmokerPatient did not abstain from smoking.,  ?  ?Pulmonary exam normal ? ? ? ? ? ? ? Cardiovascular ?hypertension, Pt. on home beta blockers and Pt. on medications ?+ CAD  ?Normal cardiovascular exam ? ? ?  ?Neuro/Psych ?PSYCHIATRIC DISORDERS Anxiety CVA (right sided weakness, LE > UE), Residual Symptoms   ? GI/Hepatic ?Neg liver ROS, GERD  Medicated,  ?Endo/Other  ?diabetes, Type 2, Insulin Dependent ?Obesity ? ? Renal/GU ?CRFRenal disease  ? ?  ?Musculoskeletal ? ?(+) Arthritis ,  ? Abdominal ?  ?Peds ? Hematology ? ?On plavix ?   ?Anesthesia Other Findings ? ? Reproductive/Obstetrics ? ?PCOS ? ? ?  ? ? ? ? ? ? ? ? ? ? ? ? ? ?  ?  ? ? ? ? ? ? ?Anesthesia Physical ?Anesthesia Plan ? ?ASA: 3 ? ?Anesthesia Plan: General  ? ?Post-op Pain Management: Tylenol PO (pre-op)*  ? ?Induction: Intravenous ? ?PONV Risk Score and Plan: 3 and Treatment may vary due to age or medical condition, Ondansetron, Dexamethasone and Propofol infusion ? ?Airway Management Planned: Oral ETT ? ?Additional Equipment: None ? ?Intra-op Plan:  ? ?Post-operative Plan: Extubation in OR ? ?Informed Consent: I have reviewed the patients History and Physical, chart, labs and discussed the procedure including the risks, benefits and alternatives for the proposed anesthesia with the patient or authorized representative who has indicated his/her understanding and acceptance.  ? ? ? ?Dental advisory given ? ?Plan Discussed  with: CRNA and Anesthesiologist ? ?Anesthesia Plan Comments:   ? ? ? ? ?Anesthesia Quick Evaluation ? ?

## 2021-09-25 DIAGNOSIS — N3946 Mixed incontinence: Secondary | ICD-10-CM | POA: Diagnosis not present

## 2021-09-25 DIAGNOSIS — R3914 Feeling of incomplete bladder emptying: Secondary | ICD-10-CM | POA: Diagnosis not present

## 2021-09-25 DIAGNOSIS — N3020 Other chronic cystitis without hematuria: Secondary | ICD-10-CM | POA: Diagnosis not present

## 2021-10-01 ENCOUNTER — Inpatient Hospital Stay (HOSPITAL_COMMUNITY)
Admission: RE | Disposition: A | Discharge status: Home Health | Payer: Self-pay | Source: Home / Self Care | Attending: Neurosurgery

## 2021-10-01 ENCOUNTER — Encounter (HOSPITAL_COMMUNITY): Payer: Self-pay | Admitting: Neurosurgery

## 2021-10-01 ENCOUNTER — Inpatient Hospital Stay (HOSPITAL_COMMUNITY): Payer: HMO

## 2021-10-01 ENCOUNTER — Other Ambulatory Visit: Payer: Self-pay

## 2021-10-01 ENCOUNTER — Inpatient Hospital Stay (HOSPITAL_COMMUNITY)
Admission: RE | Admit: 2021-10-01 | Discharge: 2021-10-11 | DRG: 454 | Disposition: A | Discharge status: Home Health | Payer: HMO | Attending: Neurosurgery | Admitting: Neurosurgery

## 2021-10-01 ENCOUNTER — Inpatient Hospital Stay (HOSPITAL_COMMUNITY): Payer: HMO | Admitting: Physician Assistant

## 2021-10-01 ENCOUNTER — Inpatient Hospital Stay (HOSPITAL_COMMUNITY): Payer: HMO | Admitting: Anesthesiology

## 2021-10-01 DIAGNOSIS — M4807 Spinal stenosis, lumbosacral region: Secondary | ICD-10-CM | POA: Diagnosis present

## 2021-10-01 DIAGNOSIS — Z794 Long term (current) use of insulin: Secondary | ICD-10-CM

## 2021-10-01 DIAGNOSIS — N184 Chronic kidney disease, stage 4 (severe): Secondary | ICD-10-CM | POA: Diagnosis not present

## 2021-10-01 DIAGNOSIS — M48062 Spinal stenosis, lumbar region with neurogenic claudication: Secondary | ICD-10-CM | POA: Diagnosis present

## 2021-10-01 DIAGNOSIS — G4730 Sleep apnea, unspecified: Secondary | ICD-10-CM | POA: Diagnosis present

## 2021-10-01 DIAGNOSIS — M4726 Other spondylosis with radiculopathy, lumbar region: Secondary | ICD-10-CM | POA: Diagnosis not present

## 2021-10-01 DIAGNOSIS — Z90710 Acquired absence of both cervix and uterus: Secondary | ICD-10-CM

## 2021-10-01 DIAGNOSIS — M5137 Other intervertebral disc degeneration, lumbosacral region: Secondary | ICD-10-CM | POA: Diagnosis present

## 2021-10-01 DIAGNOSIS — F17210 Nicotine dependence, cigarettes, uncomplicated: Secondary | ICD-10-CM | POA: Diagnosis not present

## 2021-10-01 DIAGNOSIS — Z5320 Procedure and treatment not carried out because of patient's decision for unspecified reasons: Secondary | ICD-10-CM | POA: Diagnosis not present

## 2021-10-01 DIAGNOSIS — J449 Chronic obstructive pulmonary disease, unspecified: Secondary | ICD-10-CM | POA: Diagnosis not present

## 2021-10-01 DIAGNOSIS — M4317 Spondylolisthesis, lumbosacral region: Secondary | ICD-10-CM | POA: Diagnosis present

## 2021-10-01 DIAGNOSIS — R1310 Dysphagia, unspecified: Secondary | ICD-10-CM | POA: Diagnosis not present

## 2021-10-01 DIAGNOSIS — N139 Obstructive and reflux uropathy, unspecified: Secondary | ICD-10-CM | POA: Diagnosis not present

## 2021-10-01 DIAGNOSIS — I129 Hypertensive chronic kidney disease with stage 1 through stage 4 chronic kidney disease, or unspecified chronic kidney disease: Secondary | ICD-10-CM | POA: Diagnosis present

## 2021-10-01 DIAGNOSIS — M4316 Spondylolisthesis, lumbar region: Secondary | ICD-10-CM | POA: Diagnosis present

## 2021-10-01 DIAGNOSIS — M549 Dorsalgia, unspecified: Secondary | ICD-10-CM | POA: Diagnosis present

## 2021-10-01 DIAGNOSIS — M8580 Other specified disorders of bone density and structure, unspecified site: Secondary | ICD-10-CM | POA: Diagnosis present

## 2021-10-01 DIAGNOSIS — Z833 Family history of diabetes mellitus: Secondary | ICD-10-CM

## 2021-10-01 DIAGNOSIS — E669 Obesity, unspecified: Secondary | ICD-10-CM | POA: Diagnosis not present

## 2021-10-01 DIAGNOSIS — Z7902 Long term (current) use of antithrombotics/antiplatelets: Secondary | ICD-10-CM

## 2021-10-01 DIAGNOSIS — M1990 Unspecified osteoarthritis, unspecified site: Secondary | ICD-10-CM | POA: Diagnosis present

## 2021-10-01 DIAGNOSIS — N390 Urinary tract infection, site not specified: Secondary | ICD-10-CM | POA: Diagnosis not present

## 2021-10-01 DIAGNOSIS — N393 Stress incontinence (female) (male): Secondary | ICD-10-CM | POA: Diagnosis present

## 2021-10-01 DIAGNOSIS — D638 Anemia in other chronic diseases classified elsewhere: Secondary | ICD-10-CM | POA: Diagnosis not present

## 2021-10-01 DIAGNOSIS — N179 Acute kidney failure, unspecified: Secondary | ICD-10-CM | POA: Diagnosis present

## 2021-10-01 DIAGNOSIS — F419 Anxiety disorder, unspecified: Secondary | ICD-10-CM

## 2021-10-01 DIAGNOSIS — E1122 Type 2 diabetes mellitus with diabetic chronic kidney disease: Secondary | ICD-10-CM | POA: Diagnosis present

## 2021-10-01 DIAGNOSIS — Z6832 Body mass index (BMI) 32.0-32.9, adult: Secondary | ICD-10-CM

## 2021-10-01 DIAGNOSIS — M5117 Intervertebral disc disorders with radiculopathy, lumbosacral region: Secondary | ICD-10-CM

## 2021-10-01 DIAGNOSIS — E1129 Type 2 diabetes mellitus with other diabetic kidney complication: Secondary | ICD-10-CM | POA: Diagnosis not present

## 2021-10-01 DIAGNOSIS — Z79899 Other long term (current) drug therapy: Secondary | ICD-10-CM

## 2021-10-01 DIAGNOSIS — E785 Hyperlipidemia, unspecified: Secondary | ICD-10-CM | POA: Diagnosis not present

## 2021-10-01 DIAGNOSIS — Z885 Allergy status to narcotic agent status: Secondary | ICD-10-CM

## 2021-10-01 DIAGNOSIS — Z01818 Encounter for other preprocedural examination: Secondary | ICD-10-CM

## 2021-10-01 DIAGNOSIS — Z888 Allergy status to other drugs, medicaments and biological substances status: Secondary | ICD-10-CM

## 2021-10-01 DIAGNOSIS — Z87440 Personal history of urinary (tract) infections: Secondary | ICD-10-CM

## 2021-10-01 DIAGNOSIS — M48061 Spinal stenosis, lumbar region without neurogenic claudication: Principal | ICD-10-CM | POA: Diagnosis present

## 2021-10-01 DIAGNOSIS — N189 Chronic kidney disease, unspecified: Secondary | ICD-10-CM | POA: Diagnosis not present

## 2021-10-01 DIAGNOSIS — D62 Acute posthemorrhagic anemia: Secondary | ICD-10-CM | POA: Diagnosis not present

## 2021-10-01 DIAGNOSIS — Z8249 Family history of ischemic heart disease and other diseases of the circulatory system: Secondary | ICD-10-CM

## 2021-10-01 DIAGNOSIS — M5116 Intervertebral disc disorders with radiculopathy, lumbar region: Secondary | ICD-10-CM | POA: Diagnosis not present

## 2021-10-01 DIAGNOSIS — Z91041 Radiographic dye allergy status: Secondary | ICD-10-CM

## 2021-10-01 DIAGNOSIS — K5909 Other constipation: Secondary | ICD-10-CM | POA: Diagnosis not present

## 2021-10-01 DIAGNOSIS — Z882 Allergy status to sulfonamides status: Secondary | ICD-10-CM

## 2021-10-01 DIAGNOSIS — I2510 Atherosclerotic heart disease of native coronary artery without angina pectoris: Secondary | ICD-10-CM | POA: Diagnosis not present

## 2021-10-01 DIAGNOSIS — Z8673 Personal history of transient ischemic attack (TIA), and cerebral infarction without residual deficits: Secondary | ICD-10-CM

## 2021-10-01 DIAGNOSIS — Z91013 Allergy to seafood: Secondary | ICD-10-CM

## 2021-10-01 LAB — HEMOGLOBIN A1C
Hgb A1c MFr Bld: 6.2 % — ABNORMAL HIGH (ref 4.8–5.6)
Mean Plasma Glucose: 131.24 mg/dL

## 2021-10-01 LAB — GLUCOSE, CAPILLARY
Glucose-Capillary: 107 mg/dL — ABNORMAL HIGH (ref 70–99)
Glucose-Capillary: 231 mg/dL — ABNORMAL HIGH (ref 70–99)
Glucose-Capillary: 223 mg/dL — ABNORMAL HIGH (ref 70–99)
Glucose-Capillary: 251 mg/dL — ABNORMAL HIGH (ref 70–99)
Glucose-Capillary: 236 mg/dL — ABNORMAL HIGH (ref 70–99)

## 2021-10-01 LAB — SURGICAL PCR SCREEN
MRSA, PCR: NEGATIVE
Staphylococcus aureus: NEGATIVE

## 2021-10-01 LAB — BASIC METABOLIC PANEL
Anion gap: 8 (ref 5–15)
BUN: 17 mg/dL (ref 8–23)
CO2: 22 mmol/L (ref 22–32)
Calcium: 9 mg/dL (ref 8.9–10.3)
Chloride: 104 mmol/L (ref 98–111)
Creatinine, Ser: 2.07 mg/dL — ABNORMAL HIGH (ref 0.44–1.00)
GFR, Estimated: 25 mL/min — ABNORMAL LOW (ref >60)
Glucose, Bld: 117 mg/dL — ABNORMAL HIGH (ref 70–99)
Potassium: 4.4 mmol/L (ref 3.5–5.1)
Sodium: 134 mmol/L — ABNORMAL LOW (ref 135–145)

## 2021-10-01 SURGERY — POSTERIOR LUMBAR FUSION 2 LEVEL
Anesthesia: General | Site: Spine Lumbar

## 2021-10-01 MED ORDER — HYDROXYZINE HCL 50 MG/ML IM SOLN
50.0000 mg | Freq: Four times a day (QID) | INTRAMUSCULAR | Status: DC | PRN
  Administered 2021-10-01: 50 mg via INTRAMUSCULAR
  Filled 2021-10-01 (×2): qty 1

## 2021-10-01 MED ORDER — MENTHOL 3 MG MT LOZG: 1.0000 | LOZENGE | OROMUCOSAL | Status: DC | PRN

## 2021-10-01 MED ORDER — PROPOFOL 1000 MG/100ML IV EMUL
INTRAVENOUS | Status: AC
  Filled 2021-10-01: qty 100

## 2021-10-01 MED ORDER — FENTANYL CITRATE (PF) 250 MCG/5ML IJ SOLN
INTRAMUSCULAR | Status: AC
  Filled 2021-10-01: qty 5

## 2021-10-01 MED ORDER — SODIUM CHLORIDE 0.9% FLUSH
3.0000 mL | Freq: Two times a day (BID) | INTRAVENOUS | Status: DC
  Administered 2021-10-01 – 2021-10-11 (×17): 3 mL via INTRAVENOUS

## 2021-10-01 MED ORDER — ROCURONIUM BROMIDE 10 MG/ML (PF) SYRINGE
PREFILLED_SYRINGE | INTRAVENOUS | Status: DC | PRN
  Administered 2021-10-01: 40 mg via INTRAVENOUS
  Administered 2021-10-01: 20 mg via INTRAVENOUS
  Administered 2021-10-01: 50 mg via INTRAVENOUS

## 2021-10-01 MED ORDER — TORSEMIDE 20 MG PO TABS: 40.0000 mg | ORAL_TABLET | Freq: Every day | ORAL | Status: DC | PRN

## 2021-10-01 MED ORDER — PROPOFOL 10 MG/ML IV BOLUS
INTRAVENOUS | Status: AC
  Filled 2021-10-01: qty 20

## 2021-10-01 MED ORDER — LACTATED RINGERS IV SOLN: INTRAVENOUS | Status: DC

## 2021-10-01 MED ORDER — SODIUM CHLORIDE 0.9 % IV SOLN: INTRAVENOUS | Status: DC | PRN

## 2021-10-01 MED ORDER — MIRTAZAPINE 15 MG PO TABS
7.5000 mg | ORAL_TABLET | Freq: Every day | ORAL | Status: DC
  Administered 2021-10-01 – 2021-10-10 (×10): 7.5 mg via ORAL
  Filled 2021-10-01 (×11): qty 1

## 2021-10-01 MED ORDER — ONDANSETRON HCL 4 MG/2ML IJ SOLN
INTRAMUSCULAR | Status: DC | PRN
  Administered 2021-10-01: 4 mg via INTRAVENOUS

## 2021-10-01 MED ORDER — PROPOFOL 10 MG/ML IV BOLUS
INTRAVENOUS | Status: DC | PRN
Start: 2021-10-01 — End: 2021-10-01
  Administered 2021-10-01: 150 mg via INTRAVENOUS

## 2021-10-01 MED ORDER — CHLORHEXIDINE GLUCONATE CLOTH 2 % EX PADS
6.0000 | MEDICATED_PAD | Freq: Once | CUTANEOUS | Status: DC
Start: 2021-10-01 — End: 2021-10-01

## 2021-10-01 MED ORDER — BUPIVACAINE LIPOSOME 1.3 % IJ SUSP
INTRAMUSCULAR | Status: AC
  Filled 2021-10-01: qty 20

## 2021-10-01 MED ORDER — LIDOCAINE 2% (20 MG/ML) 5 ML SYRINGE
INTRAMUSCULAR | Status: AC
  Filled 2021-10-01: qty 5

## 2021-10-01 MED ORDER — BUPIVACAINE LIPOSOME 1.3 % IJ SUSP
INTRAMUSCULAR | Status: DC | PRN
  Administered 2021-10-01: 20 mL

## 2021-10-01 MED ORDER — INSULIN ASPART 100 UNIT/ML IJ SOLN
0.0000 [IU] | Freq: Every day | INTRAMUSCULAR | Status: DC
  Administered 2021-10-01 – 2021-10-10 (×2): 2 [IU] via SUBCUTANEOUS

## 2021-10-01 MED ORDER — PROSIGHT PO TABS
1.0000 | ORAL_TABLET | Freq: Every day | ORAL | Status: DC
  Administered 2021-10-04 – 2021-10-11 (×8): 1 via ORAL
  Filled 2021-10-01 (×10): qty 1

## 2021-10-01 MED ORDER — HYDRALAZINE HCL 25 MG PO TABS
25.0000 mg | ORAL_TABLET | Freq: Three times a day (TID) | ORAL | Status: DC
  Administered 2021-10-01 – 2021-10-11 (×30): 25 mg via ORAL
  Filled 2021-10-01 (×2): qty 1
  Filled 2021-10-01 (×2): qty 3
  Filled 2021-10-01 (×6): qty 1
  Filled 2021-10-01: qty 3
  Filled 2021-10-01 (×3): qty 1
  Filled 2021-10-01 (×3): qty 3
  Filled 2021-10-01 (×4): qty 1
  Filled 2021-10-01: qty 3
  Filled 2021-10-01 (×8): qty 1

## 2021-10-01 MED ORDER — 0.9 % SODIUM CHLORIDE (POUR BTL) OPTIME
TOPICAL | Status: DC | PRN
  Administered 2021-10-01 (×2): 1000 mL

## 2021-10-01 MED ORDER — OXYCODONE HCL 5 MG PO TABS: 5.0000 mg | ORAL_TABLET | Freq: Once | ORAL | Status: DC | PRN

## 2021-10-01 MED ORDER — THROMBIN 5000 UNITS EX SOLR
CUTANEOUS | Status: AC
  Filled 2021-10-01: qty 5000

## 2021-10-01 MED ORDER — OXYCODONE HCL 5 MG PO TABS
5.0000 mg | ORAL_TABLET | ORAL | Status: DC | PRN
  Administered 2021-10-02 – 2021-10-03 (×3): 5 mg via ORAL
  Filled 2021-10-01 (×3): qty 1

## 2021-10-01 MED ORDER — OXYCODONE HCL 5 MG/5ML PO SOLN: 5.0000 mg | Freq: Once | ORAL | Status: DC | PRN

## 2021-10-01 MED ORDER — ACETAMINOPHEN 500 MG PO TABS
1000.0000 mg | ORAL_TABLET | Freq: Four times a day (QID) | ORAL | Status: AC
  Administered 2021-10-01 – 2021-10-02 (×4): 1000 mg via ORAL
  Filled 2021-10-01 (×4): qty 2

## 2021-10-01 MED ORDER — ONDANSETRON HCL 4 MG PO TABS
4.0000 mg | ORAL_TABLET | Freq: Four times a day (QID) | ORAL | Status: DC | PRN
  Administered 2021-10-02 (×2): 4 mg via ORAL
  Filled 2021-10-01 (×2): qty 1

## 2021-10-01 MED ORDER — PANTOPRAZOLE SODIUM 40 MG PO TBEC
40.0000 mg | DELAYED_RELEASE_TABLET | Freq: Every day | ORAL | Status: DC
  Administered 2021-10-01 – 2021-10-07 (×7): 40 mg via ORAL
  Filled 2021-10-01 (×8): qty 1

## 2021-10-01 MED ORDER — CEFAZOLIN SODIUM-DEXTROSE 2-4 GM/100ML-% IV SOLN
2.0000 g | INTRAVENOUS | Status: DC
  Filled 2021-10-01: qty 100

## 2021-10-01 MED ORDER — SODIUM CHLORIDE 0.9% FLUSH: 3.0000 mL | INTRAVENOUS | Status: DC | PRN

## 2021-10-01 MED ORDER — OXYBUTYNIN CHLORIDE 5 MG PO TABS
5.0000 mg | ORAL_TABLET | Freq: Every day | ORAL | Status: DC
  Administered 2021-10-01 – 2021-10-10 (×10): 5 mg via ORAL
  Filled 2021-10-01 (×14): qty 1

## 2021-10-01 MED ORDER — MUPIROCIN 2 % EX OINT
TOPICAL_OINTMENT | CUTANEOUS | Status: AC
  Filled 2021-10-01: qty 22

## 2021-10-01 MED ORDER — SODIUM CHLORIDE 0.9 % IV SOLN: 250.0000 mL | INTRAVENOUS | Status: DC

## 2021-10-01 MED ORDER — FENTANYL CITRATE (PF) 250 MCG/5ML IJ SOLN
INTRAMUSCULAR | Status: DC | PRN
  Administered 2021-10-01: 50 ug via INTRAVENOUS
  Administered 2021-10-01: 100 ug via INTRAVENOUS
  Administered 2021-10-01: 50 ug via INTRAVENOUS

## 2021-10-01 MED ORDER — ACETAMINOPHEN 650 MG RE SUPP: 650.0000 mg | RECTAL | Status: DC | PRN

## 2021-10-01 MED ORDER — MIDAZOLAM HCL 2 MG/2ML IJ SOLN
INTRAMUSCULAR | Status: DC | PRN
Start: 2021-10-01 — End: 2021-10-01
  Administered 2021-10-01: 2 mg via INTRAVENOUS

## 2021-10-01 MED ORDER — ORAL CARE MOUTH RINSE: 15.0000 mL | Freq: Once | OROMUCOSAL | Status: AC

## 2021-10-01 MED ORDER — METOPROLOL SUCCINATE ER 100 MG PO TB24
100.0000 mg | ORAL_TABLET | Freq: Every day | ORAL | Status: DC
  Administered 2021-10-01 – 2021-10-11 (×11): 100 mg via ORAL
  Filled 2021-10-01 (×12): qty 1

## 2021-10-01 MED ORDER — FENTANYL CITRATE (PF) 100 MCG/2ML IJ SOLN
25.0000 ug | INTRAMUSCULAR | Status: DC | PRN
  Administered 2021-10-01: 25 ug via INTRAVENOUS

## 2021-10-01 MED ORDER — BUPIVACAINE-EPINEPHRINE 0.5% -1:200000 IJ SOLN
INTRAMUSCULAR | Status: AC
  Filled 2021-10-01: qty 1

## 2021-10-01 MED ORDER — DEXAMETHASONE SODIUM PHOSPHATE 10 MG/ML IJ SOLN
INTRAMUSCULAR | Status: AC
  Filled 2021-10-01: qty 1

## 2021-10-01 MED ORDER — THROMBIN (RECOMBINANT) 5000 UNITS EX SOLR
CUTANEOUS | Status: AC
  Filled 2021-10-01: qty 5000

## 2021-10-01 MED ORDER — ONDANSETRON HCL 4 MG/2ML IJ SOLN
INTRAMUSCULAR | Status: AC
  Filled 2021-10-01: qty 2

## 2021-10-01 MED ORDER — CHLORHEXIDINE GLUCONATE 0.12 % MT SOLN
15.0000 mL | Freq: Once | OROMUCOSAL | Status: AC
  Administered 2021-10-01: 15 mL via OROMUCOSAL
  Filled 2021-10-01: qty 15

## 2021-10-01 MED ORDER — CALCITRIOL 0.25 MCG PO CAPS
0.2500 ug | ORAL_CAPSULE | Freq: Every day | ORAL | Status: DC
  Administered 2021-10-01 – 2021-10-11 (×10): 0.25 ug via ORAL
  Filled 2021-10-01 (×12): qty 1

## 2021-10-01 MED ORDER — CEFAZOLIN SODIUM-DEXTROSE 2-4 GM/100ML-% IV SOLN
2.0000 g | Freq: Three times a day (TID) | INTRAVENOUS | Status: AC
  Administered 2021-10-01: 2 g via INTRAVENOUS
  Filled 2021-10-01: qty 100

## 2021-10-01 MED ORDER — ACETAMINOPHEN 500 MG PO TABS
1000.0000 mg | ORAL_TABLET | Freq: Once | ORAL | Status: AC
  Administered 2021-10-01: 1000 mg via ORAL
  Filled 2021-10-01: qty 2

## 2021-10-01 MED ORDER — BACITRACIN ZINC 500 UNIT/GM EX OINT
TOPICAL_OINTMENT | CUTANEOUS | Status: AC
  Filled 2021-10-01: qty 28.35

## 2021-10-01 MED ORDER — ONDANSETRON HCL 4 MG/2ML IJ SOLN: 4.0000 mg | Freq: Once | INTRAMUSCULAR | Status: DC | PRN

## 2021-10-01 MED ORDER — ALBUTEROL SULFATE HFA 108 (90 BASE) MCG/ACT IN AERS: 2.0000 | INHALATION_SPRAY | Freq: Four times a day (QID) | RESPIRATORY_TRACT | Status: DC | PRN

## 2021-10-01 MED ORDER — INSULIN ASPART 100 UNIT/ML IJ SOLN: 0.0000 [IU] | INTRAMUSCULAR | Status: DC | PRN

## 2021-10-01 MED ORDER — MIDAZOLAM HCL 2 MG/2ML IJ SOLN
INTRAMUSCULAR | Status: AC
  Filled 2021-10-01: qty 2

## 2021-10-01 MED ORDER — ONDANSETRON HCL 4 MG/2ML IJ SOLN
4.0000 mg | Freq: Four times a day (QID) | INTRAMUSCULAR | Status: DC | PRN
  Administered 2021-10-01 – 2021-10-07 (×4): 4 mg via INTRAVENOUS
  Filled 2021-10-01 (×4): qty 2

## 2021-10-01 MED ORDER — PROPOFOL 500 MG/50ML IV EMUL
INTRAVENOUS | Status: DC | PRN
  Administered 2021-10-01: 25 ug/kg/min via INTRAVENOUS

## 2021-10-01 MED ORDER — BACITRACIN ZINC 500 UNIT/GM EX OINT
TOPICAL_OINTMENT | CUTANEOUS | Status: DC | PRN
  Administered 2021-10-01: 1 via TOPICAL

## 2021-10-01 MED ORDER — PHENYLEPHRINE HCL-NACL 20-0.9 MG/250ML-% IV SOLN
INTRAVENOUS | Status: DC | PRN
  Administered 2021-10-01: 25 ug/min via INTRAVENOUS

## 2021-10-01 MED ORDER — MORPHINE SULFATE (PF) 4 MG/ML IV SOLN
4.0000 mg | INTRAVENOUS | Status: DC | PRN
  Administered 2021-10-01 – 2021-10-03 (×2): 4 mg via INTRAVENOUS
  Filled 2021-10-01 (×3): qty 1

## 2021-10-01 MED ORDER — THROMBIN 5000 UNITS EX SOLR
OROMUCOSAL | Status: DC | PRN
  Administered 2021-10-01: 5 mL
  Administered 2021-10-01: 5 mL via TOPICAL

## 2021-10-01 MED ORDER — INSULIN ASPART 100 UNIT/ML IJ SOLN
0.0000 [IU] | Freq: Three times a day (TID) | INTRAMUSCULAR | Status: DC
  Administered 2021-10-01: 8 [IU] via SUBCUTANEOUS
  Administered 2021-10-02 (×3): 3 [IU] via SUBCUTANEOUS
  Administered 2021-10-03: 2 [IU] via SUBCUTANEOUS
  Administered 2021-10-03: 3 [IU] via SUBCUTANEOUS
  Administered 2021-10-04 – 2021-10-05 (×3): 2 [IU] via SUBCUTANEOUS
  Administered 2021-10-05 – 2021-10-07 (×5): 3 [IU] via SUBCUTANEOUS
  Administered 2021-10-07 (×2): 2 [IU] via SUBCUTANEOUS
  Administered 2021-10-08 (×2): 3 [IU] via SUBCUTANEOUS
  Administered 2021-10-08 – 2021-10-09 (×2): 2 [IU] via SUBCUTANEOUS
  Administered 2021-10-09 – 2021-10-10 (×4): 3 [IU] via SUBCUTANEOUS
  Administered 2021-10-10: 2 [IU] via SUBCUTANEOUS
  Administered 2021-10-11 (×2): 3 [IU] via SUBCUTANEOUS

## 2021-10-01 MED ORDER — OXYCODONE HCL 5 MG PO TABS
10.0000 mg | ORAL_TABLET | ORAL | Status: DC | PRN
  Administered 2021-10-01 – 2021-10-05 (×5): 10 mg via ORAL
  Filled 2021-10-01 (×7): qty 2

## 2021-10-01 MED ORDER — LIDOCAINE 2% (20 MG/ML) 5 ML SYRINGE
INTRAMUSCULAR | Status: DC | PRN
  Administered 2021-10-01: 100 mg via INTRAVENOUS

## 2021-10-01 MED ORDER — SUGAMMADEX SODIUM 200 MG/2ML IV SOLN
INTRAVENOUS | Status: DC | PRN
  Administered 2021-10-01: 200 mg via INTRAVENOUS

## 2021-10-01 MED ORDER — DEXAMETHASONE SODIUM PHOSPHATE 10 MG/ML IJ SOLN
INTRAMUSCULAR | Status: DC | PRN
  Administered 2021-10-01: 10 mg via INTRAVENOUS

## 2021-10-01 MED ORDER — PHENOL 1.4 % MT LIQD
1.0000 | OROMUCOSAL | Status: DC | PRN
  Administered 2021-10-08: 1 via OROMUCOSAL
  Filled 2021-10-01: qty 177

## 2021-10-01 MED ORDER — ZOLPIDEM TARTRATE 5 MG PO TABS
5.0000 mg | ORAL_TABLET | Freq: Every evening | ORAL | Status: DC | PRN
  Administered 2021-10-03 – 2021-10-04 (×2): 5 mg via ORAL
  Filled 2021-10-01 (×2): qty 1

## 2021-10-01 MED ORDER — ROCURONIUM BROMIDE 10 MG/ML (PF) SYRINGE
PREFILLED_SYRINGE | INTRAVENOUS | Status: AC
  Filled 2021-10-01: qty 30

## 2021-10-01 MED ORDER — CYCLOBENZAPRINE HCL 10 MG PO TABS
10.0000 mg | ORAL_TABLET | Freq: Three times a day (TID) | ORAL | Status: DC | PRN
Start: 2021-10-01 — End: 2021-10-05
  Administered 2021-10-01 – 2021-10-05 (×5): 10 mg via ORAL
  Filled 2021-10-01 (×5): qty 1

## 2021-10-01 MED ORDER — BISACODYL 10 MG RE SUPP
10.0000 mg | Freq: Every day | RECTAL | Status: DC | PRN
  Administered 2021-10-09: 10 mg via RECTAL
  Filled 2021-10-01: qty 1

## 2021-10-01 MED ORDER — FENTANYL CITRATE (PF) 100 MCG/2ML IJ SOLN
INTRAMUSCULAR | Status: AC
Start: 2021-10-01 — End: 2021-10-02
  Filled 2021-10-01: qty 2

## 2021-10-01 MED ORDER — CEFAZOLIN SODIUM-DEXTROSE 2-3 GM-%(50ML) IV SOLR
INTRAVENOUS | Status: DC | PRN
Start: 2021-10-01 — End: 2021-10-01
  Administered 2021-10-01: 2 g via INTRAVENOUS

## 2021-10-01 MED ORDER — ROSUVASTATIN CALCIUM 5 MG PO TABS
5.0000 mg | ORAL_TABLET | Freq: Every day | ORAL | Status: DC
Start: 2021-10-01 — End: 2021-10-11
  Administered 2021-10-01 – 2021-10-11 (×11): 5 mg via ORAL
  Filled 2021-10-01 (×12): qty 1

## 2021-10-01 MED ORDER — DOCUSATE SODIUM 100 MG PO CAPS
100.0000 mg | ORAL_CAPSULE | Freq: Two times a day (BID) | ORAL | Status: DC
Start: 2021-10-01 — End: 2021-10-11
  Administered 2021-10-01 – 2021-10-11 (×20): 100 mg via ORAL
  Filled 2021-10-01 (×20): qty 1

## 2021-10-01 MED ORDER — ALBUTEROL SULFATE (2.5 MG/3ML) 0.083% IN NEBU
2.5000 mg | INHALATION_SOLUTION | Freq: Four times a day (QID) | RESPIRATORY_TRACT | Status: DC | PRN
Start: 2021-10-01 — End: 2021-10-11

## 2021-10-01 MED ORDER — BUPIVACAINE-EPINEPHRINE (PF) 0.5% -1:200000 IJ SOLN
INTRAMUSCULAR | Status: DC | PRN
  Administered 2021-10-01: 10 mL

## 2021-10-01 MED ORDER — LOSARTAN POTASSIUM 50 MG PO TABS
100.0000 mg | ORAL_TABLET | Freq: Every day | ORAL | Status: DC
  Administered 2021-10-01 – 2021-10-03 (×3): 100 mg via ORAL
  Filled 2021-10-01 (×4): qty 2

## 2021-10-01 MED ORDER — ACETAMINOPHEN 325 MG PO TABS
650.0000 mg | ORAL_TABLET | ORAL | Status: DC | PRN
Start: 2021-10-01 — End: 2021-10-05
  Administered 2021-10-05: 650 mg via ORAL
  Filled 2021-10-01: qty 2

## 2021-10-01 SURGICAL SUPPLY — 74 items
APL SKNCLS STERI-STRIP NONHPOA (GAUZE/BANDAGES/DRESSINGS) ×1
BAG COUNTER SPONGE SURGICOUNT (BAG) ×3 IMPLANT
BAG SPNG CNTER NS LX DISP (BAG) ×1
BASKET BONE COLLECTION (BASKET) ×3 IMPLANT
BENZOIN TINCTURE PRP APPL 2/3 (GAUZE/BANDAGES/DRESSINGS) ×3 IMPLANT
BLADE CLIPPER SURG (BLADE) IMPLANT
BUR MATCHSTICK NEURO 3.0 LAGG (BURR) ×3 IMPLANT
BUR PRECISION FLUTE 6.0 (BURR) ×3 IMPLANT
CAGE ALTERA 10X31X10-14 15D (Cage) ×1 IMPLANT
CAGE ALTERA 10X31X9-13 15D (Cage) ×1 IMPLANT
CANISTER SUCT 3000ML PPV (MISCELLANEOUS) ×2 IMPLANT
CAP LOCK DLX THRD (Cap) ×6 IMPLANT
CARTRIDGE OIL MAESTRO DRILL (MISCELLANEOUS) ×2 IMPLANT
CLIP GRAFTMAG DISP 742X3.25 (NEUROSURGERY SUPPLIES) IMPLANT
CNTNR URN SCR LID CUP LEK RST (MISCELLANEOUS) ×2 IMPLANT
CONT SPEC 4OZ STRL OR WHT (MISCELLANEOUS) ×2
COVER BACK TABLE 60X90IN (DRAPES) ×3 IMPLANT
DECANTER SPIKE VIAL GLASS SM (MISCELLANEOUS) ×3 IMPLANT
DIFFUSER DRILL AIR PNEUMATIC (MISCELLANEOUS) ×3 IMPLANT
DRAPE C-ARM 42X72 X-RAY (DRAPES) ×6 IMPLANT
DRAPE HALF SHEET 40X57 (DRAPES) ×3 IMPLANT
DRAPE LAPAROTOMY 100X72X124 (DRAPES) ×3 IMPLANT
DRAPE SURG 17X23 STRL (DRAPES) ×12 IMPLANT
DRSG OPSITE POSTOP 4X6 (GAUZE/BANDAGES/DRESSINGS) ×3 IMPLANT
DRSG OPSITE POSTOP 4X8 (GAUZE/BANDAGES/DRESSINGS) ×1 IMPLANT
ELECT BLADE 4.0 EZ CLEAN MEGAD (MISCELLANEOUS) ×2
ELECT REM PT RETURN 9FT ADLT (ELECTROSURGICAL) ×2
ELECTRODE BLDE 4.0 EZ CLN MEGD (MISCELLANEOUS) ×2 IMPLANT
ELECTRODE REM PT RTRN 9FT ADLT (ELECTROSURGICAL) ×2 IMPLANT
GAUZE 4X4 16PLY ~~LOC~~+RFID DBL (SPONGE) ×4 IMPLANT
GLOVE BIO SURGEON STRL SZ8 (GLOVE) ×6 IMPLANT
GLOVE BIO SURGEON STRL SZ8.5 (GLOVE) ×6 IMPLANT
GLOVE BIOGEL PI IND STRL 7.0 (GLOVE) IMPLANT
GLOVE BIOGEL PI IND STRL 7.5 (GLOVE) IMPLANT
GLOVE BIOGEL PI INDICATOR 7.0 (GLOVE) ×1
GLOVE BIOGEL PI INDICATOR 7.5 (GLOVE) ×2
GLOVE ECLIPSE 6.5 STRL STRAW (GLOVE) ×2 IMPLANT
GLOVE EXAM NITRILE XL STR (GLOVE) IMPLANT
GOWN STRL REUS W/ TWL LRG LVL3 (GOWN DISPOSABLE) IMPLANT
GOWN STRL REUS W/ TWL XL LVL3 (GOWN DISPOSABLE) ×4 IMPLANT
GOWN STRL REUS W/TWL 2XL LVL3 (GOWN DISPOSABLE) IMPLANT
GOWN STRL REUS W/TWL LRG LVL3 (GOWN DISPOSABLE) ×2
GOWN STRL REUS W/TWL XL LVL3 (GOWN DISPOSABLE) ×4
HEMOSTAT POWDER KIT SURGIFOAM (HEMOSTASIS) ×3 IMPLANT
KIT BASIN OR (CUSTOM PROCEDURE TRAY) ×3 IMPLANT
KIT GRAFTMAG DEL NEURO DISP (NEUROSURGERY SUPPLIES) ×1 IMPLANT
KIT TURNOVER KIT B (KITS) ×3 IMPLANT
MILL MEDIUM DISP (BLADE) ×3 IMPLANT
NDL HYPO 21X1.5 SAFETY (NEEDLE) IMPLANT
NEEDLE HYPO 21X1.5 SAFETY (NEEDLE) IMPLANT
NEEDLE HYPO 22GX1.5 SAFETY (NEEDLE) ×3 IMPLANT
NS IRRIG 1000ML POUR BTL (IV SOLUTION) ×3 IMPLANT
OIL CARTRIDGE MAESTRO DRILL (MISCELLANEOUS) ×2
PACK LAMINECTOMY NEURO (CUSTOM PROCEDURE TRAY) ×3 IMPLANT
PAD ARMBOARD 7.5X6 YLW CONV (MISCELLANEOUS) ×9 IMPLANT
PATTIES SURGICAL .5 X1 (DISPOSABLE) IMPLANT
PATTIES SURGICAL 1X1 (DISPOSABLE) ×1 IMPLANT
PUTTY DBM 10CC CALC GRAN (Putty) ×1 IMPLANT
PUTTY DBM 5CC CALC GRAN (Putty) ×2 IMPLANT
ROD CURVED TI 6.35X65 (Rod) ×2 IMPLANT
SCREW PA DLX CREO 7.5X50 (Screw) ×6 IMPLANT
SPONGE NEURO XRAY DETECT 1X3 (DISPOSABLE) IMPLANT
SPONGE SURGIFOAM ABS GEL 100 (HEMOSTASIS) IMPLANT
SPONGE T-LAP 4X18 ~~LOC~~+RFID (SPONGE) IMPLANT
STRIP CLOSURE SKIN 1/2X4 (GAUZE/BANDAGES/DRESSINGS) ×3 IMPLANT
SUT VIC AB 1 CT1 18XBRD ANBCTR (SUTURE) ×4 IMPLANT
SUT VIC AB 1 CT1 8-18 (SUTURE) ×4
SUT VIC AB 2-0 CP2 18 (SUTURE) ×7 IMPLANT
SYR 20ML LL LF (SYRINGE) IMPLANT
TAP SURG GLBU 6.5X40 (ORTHOPEDIC DISPOSABLE SUPPLIES) ×1 IMPLANT
TOWEL GREEN STERILE (TOWEL DISPOSABLE) ×3 IMPLANT
TOWEL GREEN STERILE FF (TOWEL DISPOSABLE) ×3 IMPLANT
TRAY FOLEY MTR SLVR 16FR STAT (SET/KITS/TRAYS/PACK) ×3 IMPLANT
WATER STERILE IRR 1000ML POUR (IV SOLUTION) ×3 IMPLANT

## 2021-10-01 NOTE — Progress Notes (Signed)
Orthopedic Tech Progress Note ?Patient Details:  ?Mckenzie Lawrence ?[birth year] ?[id number] ? ?Patient has back brace ? ?Patient ID: Mckenzie Lawrence, female   DOB: 04/27/1946, 76 y.o.   MRN: [id number] ? ?[name] ?10/01/2021, 4:19 PM ? ?

## 2021-10-01 NOTE — Transfer of Care (Signed)
Immediate Anesthesia Transfer of Care Note ? ?Patient: Mckenzie Lawrence ? ?Procedure(s) Performed: PLIF,IP,POSTERIOR INSTRUMENTATION L45, L5S1, REDO L34 DISCECTOMY (Spine Lumbar) ? ?Patient Location: PACU ? ?Anesthesia Type:General ? ?Level of Consciousness: drowsy ? ?Airway & Oxygen Therapy: Patient Spontanous Breathing and Patient connected to face mask oxygen ? ?Post-op Assessment: Report given to RN and Post -op Vital signs reviewed and stable ? ?Post vital signs: Reviewed and stable ? ?Last Vitals:  ?Vitals Value Taken Time  ?BP 136/53 10/01/21 1344  ?Temp    ?Pulse 74 10/01/21 1350  ?Resp 14 10/01/21 1350  ?SpO2 99 % 10/01/21 1350  ?Vitals shown include unvalidated device data. ? ?Last Pain:  ?Vitals:  ? 10/01/21 1330  ?TempSrc:   ?PainSc: Asleep  ?   ? ?  ? ?Complications: No notable events documented. ?

## 2021-10-01 NOTE — Progress Notes (Signed)
PHARMACY NOTE:  ANTIMICROBIAL RENAL DOSAGE ADJUSTMENT ? ?Current antimicrobial regimen includes a mismatch between antimicrobial dosage and estimated renal function.  As per policy approved by the Pharmacy & Therapeutics and Medical Executive Committees, the antimicrobial dosage will be adjusted accordingly. ? ?Current antimicrobial dosage:  Cefazolin 2g IV q8 x2 ? ?Indication: Post-op prophylaxis ? ?Renal Function: ? ?Estimated Creatinine Clearance: 26.5 mL/min (A) (by C-G formula based on SCr of 2.07 mg/dL (H)). ?'[]'$      On intermittent HD, scheduled: ?'[]'$      On CRRT ?   ?Antimicrobial dosage has been changed to:  Cefazolin 2g IV x1 ? ?Additional comments: ? ? ?Mckenzie Lawrence, PharmD, BCIDP, AAHIVP, CPP ?Infectious Disease Pharmacist ?10/01/2021 3:58 PM ? ? ? ? ?  ? ?

## 2021-10-01 NOTE — Anesthesia Procedure Notes (Signed)
Procedure Name: Intubation ?Date/Time: 10/01/2021 7:48 AM ?Performed by: Bryson Corona, CRNA ?Pre-anesthesia Checklist: Patient identified, Emergency Drugs available, Suction available and Patient being monitored ?Patient Re-evaluated:Patient Re-evaluated prior to induction ?Oxygen Delivery Method: Circle System Utilized ?Preoxygenation: Pre-oxygenation with 100% oxygen ?Induction Type: IV induction ?Ventilation: Mask ventilation without difficulty ?Laryngoscope Size: Mac and 3 ?Grade View: Grade II ?Tube type: Oral ?Tube size: 7.0 mm ?Number of attempts: 1 ?Airway Equipment and Method: Stylet and Oral airway ?Placement Confirmation: ETT inserted through vocal cords under direct vision, positive ETCO2 and breath sounds checked- equal and bilateral ?Secured at: 22 cm ?Tube secured with: Tape ?Dental Injury: Teeth and Oropharynx as per pre-operative assessment  ? ? ? ? ?

## 2021-10-01 NOTE — Op Note (Signed)
Brief history:The patient is a 76 year old white female who has had previous back surgery.  She has developed recurrent back and left greater right leg pain consistent with neurogenic claudication/ lumbar radiculopathy.  She failed medical management was worked up with lumbar x-rays lumbar MRI which demonstrated spondylolisthesis, spinal stenosis, herniated disc, etcetera.  I discussed the various treatment options with her.  She has decided proceed with surgery. ? ?Preoperative diagnosis:  Lumbar herniated disc, lumbar radiculopathy, lumbar and lumbosacral spondylolisthesis,Degenerative disc disease, spinal stenosis compressing both the  L4, L5 and S1 nerve roots; lumbago; lumbar radiculopathy; neurogenic claudication ? ?Postoperative diagnosis: the same ? ?Procedure: bilateral L4-5 and L5-S1Laminotomy/foraminotomies/medial facetectomy to decompress the bilateral L4, L5 and S1nerve roots(the work required to do this was in addition to the work required to do the posterior lumbar interbody fusion because of the patient's spinal stenosis, facet arthropathy. Etc. requiring a wide decompression of the nerve roots.); left L4-5 and L5-S1 transforaminal lumbar interbody fusion with local morselized autograft bone and Zimmer DBM; insertion of interbody prosthesis at L4-5 and L5-S1(globus peek expandable interbody prosthesis); posterior segmental instrumentation from L4 to S1 with globus titanium pedicle screws and rods; posterior lateral arthrodesis at L4-5 and L5-S1with local morselized autograft bone and ZimmerDBM, left L3-4 microdiskectomy. ? ?Surgeon: Dr. Earle Gell ? ?Asst.: Arnetha Massy NP ? ?Anesthesia: Gen. endotracheal ? ?Estimated blood loss: 250 cc ? ?Drains: None ? ?Complications: None ? ?Description of procedure: The patient was brought to the operating room by the anesthesia team. General endotracheal anesthesia was induced. The patient was turned to the prone position on the Wilson frame. The patient's  lumbosacral region was then prepared with Betadine scrub and Betadine solution. Sterile drapes were applied. ? ?I then injected the area to be incised with Marcaine with epinephrine solution. I then used the scalpel to make a linear midline incision over the  L3-4, L4-5 and L5-S1 interspace, incising through her old surgical scar. I then used electrocautery to perform a bilateral subperiosteal dissection exposing the spinous process and lamina of L3, L4, L5 and S1. We then obtained intraoperative radiograph to confirm our location. We then inserted the Verstrac retractor to provide exposure. ? ?I began the decompression by using the high speed drill to perform laminotomies at L4-5 and L5-S1 bilaterally and performing a left L3-4 laminotomy. We then used the Kerrison punches to widen the laminotomy and removed the ligamentum flavum at L4-5 and L5-S1 bilaterally and on the left at L3-4. We used the Kerrison punches to remove the medial facets at L4-5 and L5-S1. We performed wide foraminotomies about the bilateral L4, L5 and S1nerve roots completing the decompression. At L3-4 left we freed up the thecal sac and the left L4 nerve root.  We encountered a herniated disc as expected.  We removed multiple fragments using the pituitary forceps.  We inspected there vertebral disc at L3-4.  There is no large holes in the annulus nor impending herniation so we did not perform a intervertebral  diskectomy. ? ?We now turned our attention to the posterior lumbar interbody fusion. I used a scalpel to incise the intervertebral disc at  L4-5 and L5-S1 bilaterally. I then performed a partial intervertebral discectomy at L4-5 and L5-S1 bilaterally using the pituitary forceps. We prepared the vertebral endplates at Z6-1 and W9-U0 bilaterally for the fusion by removing the soft tissues with the curettes. We then used the trial spacers to pick the appropriate sized interbody prosthesis. We prefilled his prosthesis with a combination of  local  morselized autograft bone that we obtained during the decompression as well as Zimmer DBM. We inserted the prefilled prosthesis into the interspace at L4-5 and L5-S1 from the left, we then turned and expanded the prosthesis. There was a good snug fit of the prosthesis in the interspace. We then filled and the remainder of the intervertebral disc space with local morselized autograft bone and Zimmer DBM. This completed the posterior lumbar interbody arthrodesis.  During the decompression and insertion of the prosthesis the assistant protected the thecal sac and nerve roots with the D'Errico retractor. ? ?We now turned attention to the instrumentation. Under fluoroscopic guidance we cannulated the bilateral L4, L5 and S1 pedicles with the bone probe. We then removed the bone probe. We then tapped the pedicle with a 6.5 millimeter tap. We then removed the tap. We probed inside the tapped pedicle with a ball probe to rule out cortical breaches. We then inserted a 7.5 x 50 millimeter pedicle screw into the  L4, L5 and S1 pedicles bilaterally under fluoroscopic guidance. We then palpated along the medial aspect of the pedicles to rule out cortical breaches. There were none. The nerve roots were not injured. We then connected the unilateral pedicle screws with a lordotic rod. We compressed the construct and secured the rod in place with the caps. We then tightened the caps appropriately. This completed the instrumentation from L4-S1 bilaterally. ? ?We now turned our attention to the posterior lateral arthrodesis at L4-5 and  L5-S1 bilaterally. We used the high-speed drill to decorticate the remainder of the facets, pars, transverse process at L4-5 and L5-S1 bilaterally. We then applied a combination of local morselized autograft bone and Zimmer DBM over these decorticated posterior lateral structures. This completed the posterior lateral arthrodesis. ? ?We then obtained hemostasis using bipolar electrocautery. We  irrigated the wound out with bacitracin solution. We inspected the thecal sac and nerve roots and noted they were well decompressed. We then removed the retractor.  We injected Exparel . We reapproximated patient's thoracolumbar fascia with interrupted #1 Vicryl suture. We reapproximated patient's subcutaneous tissue with interrupted 2-0 Vicryl suture. The reapproximated patient's skin with Steri-Strips and benzoin. The wound was then coated with bacitracin ointment. A sterile dressing was applied. The drapes were removed. The patient was subsequently returned to the supine position where they were extubated by the anesthesia team. He was then transported to the post anesthesia care unit in stable condition. All sponge instrument and needle counts were reportedly correct at the end of this case. ? ? ? ? ? ?

## 2021-10-01 NOTE — H&P (Signed)
Subjective: ?The patient is a 76 year old white female who is complained of back and left great and right leg pain consistent with neurogenic claudication/lumbar radiculopathy.  She has failed medical management and was worked up with a lumbar MRI and lumbar x-rays which demonstrated she had spondylolisthesis, spinal stenosis, herniated disc, etc.  I discussed the various treatment options with her.  She has decided proceed with surgery. ? ?Past Medical History:  ?Diagnosis Date  ? Arthritis   ? Bladder incontinence   ? Broken foot   ? Cataracts, bilateral   ? Chronic kidney insufficiency   ? COPD (chronic obstructive pulmonary disease) ([place])   ? wheezing  ? CVA (cerebral vascular accident) ([place]) 2016  ? Diabetes mellitus   ? insulin dependent, Type 2  ? HLD (hyperlipidemia)   ? Hx of cardiovascular stress test   ? a. ETT (6/13):  Ex 5:13; no ischemic changes  ? Hypertension   ? controlled on meds  ? Lacunar stroke of left subthalamic region [facility]) 02/2015  ? Leg pain   ? left  ? Lower back pain   ? Neuromuscular disorder ([place])   ? stroke right hand tingling  ? Orthostatic hypotension   ? Osteopenia 01/2017  ? T score -2.0 stable from prior DEXA  ? PCOS (polycystic ovarian syndrome)   ? Personal history of tobacco use, presenting hazards to health 01/09/2015  ? Sleep apnea   ? borderline/ CPAP  ?  ?Past Surgical History:  ?Procedure Laterality Date  ? BROW LIFT Bilateral 11/04/2017  ? Procedure: BLEPHAROPLASTY UPPER EYELID W/EXCESS SKIN;  Surgeon: Karle Starch, MD;  Location: [facility];  Service: Ophthalmology;  Laterality: Bilateral;  DIABETES-insulin dependent ?uses CPAP  ? CARDIAC CATHETERIZATION  20 yrs ago  ? found nothing  ? CARPAL TUNNEL RELEASE Bilateral   ? CATARACT EXTRACTION Bilateral   ? ELBOW SURGERY Bilateral   ? ESOPHAGOGASTRODUODENOSCOPY N/A 07/25/2021  ? Procedure: ESOPHAGOGASTRODUODENOSCOPY (EGD);  Surgeon: Toledo, Benay Pike, MD;  Location: [facility];  Service: Gastroenterology;   Laterality: N/A;  IDDM  ? FOOT SURGERY    ? Groin Abscess    ? HAND SURGERY    ? KNEE SURGERY Bilateral   ? LABIAL ABSCESS    ? LUMBAR LAMINECTOMY/DECOMPRESSION MICRODISCECTOMY Left 05/11/2018  ? Procedure: LUMBAR LAMINECTOMY/DECOMPRESSION MICRODISCECTOMY 1 LEVEL- L4-5;  Surgeon: [name], MD;  Location: [facility];  Service: Neurosurgery;  Laterality: Left;  ? OOPHORECTOMY    ? BSO  ? PUBO VAG SLING    ? SHOULDER SURGERY    ? bilateral arthroscopies  ? VAGINAL HYSTERECTOMY  1979  ?  ?Allergies  ?Allergen Reactions  ? Iodine Anaphylaxis and Other (See Comments)  ?  Pt states that she is allergic to ingested iodine only, okay for betadine.    ? Shellfish Allergy Anaphylaxis  ? Codeine Nausea And Vomiting  ? Morphine Sulfate Nausea And Vomiting  ? Irbesartan Other (See Comments)  ?  Reaction:  Unknown  (Avapro)  ? Sulfa Antibiotics Other (See Comments)  ?  Fever   ?  ?Social History  ? ?Tobacco Use  ? Smoking status: Every Day  ?  Packs/day: 1.50  ?  Years: 50.00  ?  Pack years: 75.00  ?  Types: Cigarettes  ? Smokeless tobacco: Never  ? Tobacco comments:  ?  3ppd x 10 year, then cut back to 1.5pdd since 02/2015  ?Substance Use Topics  ? Alcohol use: Never  ?  Alcohol/week: 0.0 standard drinks  ?  ?Family  History  ?Problem Relation Age of Onset  ? Hypertension Mother   ? Heart disease Mother 37  ?     MI  ? Diabetes Father   ? Diabetes Sister   ? Hypertension Sister   ? Diabetes Brother   ? Hypertension Brother   ? Heart disease Brother 60  ?     CAD  ? Cancer Sister   ?     Multiple myloma  ? Diabetes Brother   ? ?Prior to Admission medications   ?Medication Sig Start Date End Date Taking? Authorizing Provider  ?calcitRIOL (ROCALTROL) 0.25 MCG capsule Take 0.25 mcg by mouth daily. 08/17/21  Yes [provider]  ?hydrALAZINE (APRESOLINE) 25 MG tablet Take 25 mg by mouth 3 (three) times daily. 07/30/21  Yes [provider]  ?insulin detemir (LEVEMIR) 100 UNIT/ML injection Inject 30-60 Units into the skin  daily.   Yes [provider]  ?losartan (COZAAR) 100 MG tablet Take 1 tablet (100 mg total) by mouth daily. 09/26/15  Yes Gladstone Lighter, MD  ?metoprolol succinate (TOPROL-XL) 100 MG 24 hr tablet Take 100 mg by mouth daily.   Yes [provider]  ?mirtazapine (REMERON) 7.5 MG tablet Take 7.5 mg by mouth at bedtime. 09/03/21  Yes [provider]  ?Multiple Vitamins-Minerals (VISION VITAMINS PO) Take 1 capsule by mouth daily. AREDS   Yes [provider]  ?OVER THE COUNTER MEDICATION Take 2 tablets by mouth daily. Cranberry complex   Yes [provider]  ?oxybutynin (DITROPAN) 5 MG tablet Take 5 mg by mouth at bedtime.   Yes [provider]  ?pantoprazole (PROTONIX) 40 MG tablet Take 40 mg by mouth daily. 07/06/21  Yes [provider]  ?rosuvastatin (CRESTOR) 5 MG tablet Take 5 mg by mouth daily. 09/04/21  Yes [provider]  ?torsemide (DEMADEX) 20 MG tablet Take 40 mg by mouth daily as needed (fluid).   Yes [provider]  ?zolpidem (AMBIEN) 10 MG tablet Take 10 mg by mouth at bedtime as needed. 03/29/20  Yes [provider]  ?acetaminophen (TYLENOL) 500 MG tablet Take 1,000 mg by mouth every 6 (six) hours as needed for moderate pain or headache.    [provider]  ?albuterol (PROVENTIL) (2.5 MG/3ML) 0.083% nebulizer solution Take 3 mLs (2.5 mg total) by nebulization every 6 (six) hours as needed for wheezing or shortness of breath. 03/22/21   Boddu, Erasmo Downer, FNP  ?albuterol (VENTOLIN HFA) 108 (90 Base) MCG/ACT inhaler Inhale 2 puffs into the lungs every 6 (six) hours as needed for wheezing or shortness of breath. 04/09/21   [provider]  ?Celedonio Miyamoto 62.5-25 MCG/ACT AEPB SMARTSIG:1 Inhalation Via Inhaler Daily 04/09/21   [provider]  ?BD INSULIN SYRINGE U/F 31G X 5/16" 1 ML MISC USE 1 SYRINGE AS DIRECTED 07/10/20   [provider]  ?clopidogrel (PLAVIX) 75 MG tablet Take 75 mg by  mouth daily.    [provider]  ?diazepam (VALIUM) 10 MG tablet Take 10 mg by mouth as directed. Take 1 tablet ( 10 mg ) prior to eye injection 09/06/21   [provider]  ?LORazepam (ATIVAN) 2 MG tablet Take 0.5 mg by mouth every 8 (eight) hours as needed for anxiety. 03/29/20   [provider]  ?Donald Siva test strip 4 (four) times daily. 07/11/20   [provider]  ?oxyCODONE-acetaminophen (PERCOCET/ROXICET) 5-325 MG tablet Take 1 tablet by mouth every 6 (six) hours as needed for pain. 08/30/21   [provider]  ?promethazine (PHENERGAN) 25 MG tablet Take 25 mg by mouth every 6 (six) hours as needed for nausea/vomiting. 08/30/21   [provider]  ? ?  ?Review of Systems ? ?Positive ROS: As above ? ?All other systems have been reviewed and were otherwise negative with the exception of those mentioned in the HPI and as above. ? ?Objective: ?Vital signs in last 24 hours: ?Temp:  [98 ?F (36.7 ?C)] 98 ?F (36.7 ?C) (04/17 0349) ?Pulse Rate:  [75] 75 (04/17 1791) ?Resp:  [17] 17 (04/17 5056) ?BP: (199-203)/(65-78) 199/65 (04/17 0636) ?SpO2:  [100 %] 100 % (04/17 9794) ?Estimated body mass index is 30.89 kg/m? as calculated from the following: ?  Height as of 09/20/21: '5\' 6"'$  (1.676 m). ?  Weight as of 09/20/21: 86.8 kg. ? ? ?General Appearance: Alert ?Head: Normocephalic, without obvious abnormality, atraumatic ?Eyes: PERRL, conjunctiva/corneas clear, EOM's intact,    ?Ears: Normal  ?Throat: Normal  ?Neck: Supple, ?Back: unremarkable ?Lungs: Clear to auscultation bilaterally, respirations unlabored ?Heart: Regular rate and rhythm, no murmur, rub or gallop ?Abdomen: Soft, non-tender ?Extremities: Extremities normal, atraumatic, no cyanosis or edema ?Skin: unremarkable ? ?NEUROLOGIC:  ? ?Mental status: alert and oriented,Motor Exam - grossly normal ?Sensory Exam - grossly normal ?Reflexes:  ?Coordination - grossly normal ?Gait - grossly normal ?Balance - grossly  normal ?Cranial Nerves: ?I: smell Not tested  ?II: visual acuity  OS: Normal  OD: Normal   ?II: visual fields Full to confrontation  ?II: pupils Equal, round, reactive to light  ?III,VII: ptosis None  ?III,IV,VI: extr

## 2021-10-01 NOTE — TOC Progression Note (Signed)
Transition of Care (TOC) - Progression Note  ? ? ?Patient Details  ?Name: GRENDA LORA ?MRN: [id number] ?Date of Birth: [birth year] ? ?Transition of Care (TOC) CM/SW Contact  ?Marilu Favre, RN ?Phone Number: ?10/01/2021, 4:05 PM ? ?Clinical Narrative:    ? ? ? ? Dr Arnoldo Morale office has arranged home health services through [facility]  ?  ? ?Expected Discharge Plan and Services ?  ?  ?  ?  ?  ?                ?  ?  ?  ?  ?  ?  ?  ?  ?  ?  ? ? ?Social Determinants of Health (SDOH) Interventions ?  ? ?Readmission Risk Interventions ?   ? View : No data to display.  ?  ?  ?  ? ? ?

## 2021-10-01 NOTE — Anesthesia Postprocedure Evaluation (Signed)
Anesthesia Post Note ? ?Patient: Mckenzie Lawrence ? ?Procedure(s) Performed: PLIF,IP,POSTERIOR INSTRUMENTATION L45, L5S1, REDO L34 DISCECTOMY (Spine Lumbar) ? ?  ? ?Patient location during evaluation: PACU ?Anesthesia Type: General ?Level of consciousness: awake and alert ?Pain management: pain level controlled ?Vital Signs Assessment: post-procedure vital signs reviewed and stable ?Respiratory status: spontaneous breathing, nonlabored ventilation and respiratory function stable ?Cardiovascular status: stable and blood pressure returned to baseline ?Anesthetic complications: no ? ? ?No notable events documented. ? ?Last Vitals:  ?Vitals:  ? 10/01/21 1430 10/01/21 1500  ?BP: (!) 118/43 (!) 123/50  ?Pulse: 67 72  ?Resp: 20 14  ?Temp: 36.7 ?C   ?SpO2: 98% 100%  ?  ?Last Pain:  ?Vitals:  ? 10/01/21 1430  ?TempSrc:   ?PainSc: Asleep  ? ? ?  ?  ?  ?  ?  ?  ? ?Audry Pili ? ? ? ? ?

## 2021-10-02 LAB — GLUCOSE, CAPILLARY
Glucose-Capillary: 188 mg/dL — ABNORMAL HIGH (ref 70–99)
Glucose-Capillary: 164 mg/dL — ABNORMAL HIGH (ref 70–99)
Glucose-Capillary: 165 mg/dL — ABNORMAL HIGH (ref 70–99)
Glucose-Capillary: 166 mg/dL — ABNORMAL HIGH (ref 70–99)

## 2021-10-02 MED FILL — Thrombin (Recombinant) For Soln 5000 Unit: CUTANEOUS | Qty: 5000 | Status: AC

## 2021-10-02 NOTE — Evaluation (Signed)
Physical Therapy Evaluation ? ?Patient Details ?Name: Mckenzie Lawrence ?MRN: [id number] ?DOB: 1946-02-07 ?Today's Date: 10/02/2021 ? ?History of Present Illness ? Pt is a 76 y/o female who presents s/p L4-S1 PLIF on 10/01/2021. PMH signficant for cataracts, COPD, CVA, DM, arthritis, HTN, orthostatic hypotension, prior back surgery, bil shoulder surgery. ?  ?Clinical Impression ? Pt admitted with above diagnosis. At the time of PT eval, pt was able to demonstrate transfers with up to mod assist and ambulation with gross min assist mainly for walker management. Pt not able to concentrate on education, however family present and were educated on precautions, brace application/wearing schedule, appropriate activity progression, and car transfer. Pt currently with functional limitations due to the deficits listed below (see PT Problem List). Pt will benefit from skilled PT to increase their independence and safety with mobility to allow discharge to the venue listed below.     ?   ? ?Recommendations for follow up therapy are one component of a multi-disciplinary discharge planning process, led by the attending physician.  Recommendations may be updated based on patient status, additional functional criteria and insurance authorization. ? ?Follow Up Recommendations Home health PT ? ?  ?Assistance Recommended at Discharge Frequent or constant Supervision/Assistance  ?Patient can return home with the following ? A little help with walking and/or transfers;Assistance with cooking/housework;Assist for transportation;Help with stairs or ramp for entrance ? ?  ?Equipment Recommendations None recommended by PT  ?Recommendations for Other Services ?    ?  ?Functional Status Assessment Patient has had a recent decline in their functional status and demonstrates the ability to make significant improvements in function in a reasonable and predictable amount of time.  ? ?  ?Precautions / Restrictions Precautions ?Precautions:  Fall;Back ?Precaution Booklet Issued: Yes (comment) ?Precaution Comments: Verbally reviewed precautions during functional mobility. Poor carryover. ?Required Braces or Orthoses: Spinal Brace ?Spinal Brace: Lumbar corset;Applied in sitting position ?Restrictions ?Weight Bearing Restrictions: No  ? ?  ? ?Mobility ? Bed Mobility ?Overal bed mobility: Needs Assistance ?Bed Mobility: Rolling, Sidelying to Sit, Sit to Sidelying ?Rolling: Min assist ?Sidelying to sit: Mod assist ?  ?  ?Sit to sidelying: Min assist ?General bed mobility comments: Assist for pt to complete log roll from a flat bed, with heavy use of rails and increased time. ?  ? ?Transfers ?Overall transfer level: Needs assistance ?Equipment used: Rolling walker (2 wheels) ?Transfers: Sit to/from Stand ?Sit to Stand: Min assist ?  ?  ?  ?  ?  ?General transfer comment: Assist to power up to full standing position. VC's for hand placement on seated surface for safety. ?  ? ?Ambulation/Gait ?Ambulation/Gait assistance: Min assist ?Gait Distance (Feet): 25 Feet ?Assistive device: Rolling walker (2 wheels) ?Gait Pattern/deviations: Step-through pattern, Decreased stride length, Trunk flexed ?Gait velocity: Very slow ?Gait velocity interpretation: <1.31 ft/sec, indicative of household ambulator ?  ?General Gait Details: Assist for appropriate walker positioning and walker management. Pt able to achieve a full upright position, however bending to push the walker far out in front of her. When therapist blocked walker so pt could step up inside of it, pt became somewhat aggressive and attempted to yank the walker out of therapist's hands so she could continue. Pt was educated on optimal posture, walker position, and safety. ? ?Stairs ?  ?  ?  ?  ?  ? ?Wheelchair Mobility ?  ? ?Modified Rankin (Stroke Patients Only) ?  ? ?  ? ?Balance Overall balance assessment: Needs assistance ?Sitting-balance support:  No upper extremity supported, Feet supported ?Sitting  balance-Leahy Scale: Fair ?  ?  ?Standing balance support: Bilateral upper extremity supported, During functional activity, Reliant on assistive device for balance ?Standing balance-Leahy Scale: Poor ?  ?  ?  ?  ?  ?  ?  ?  ?  ?  ?  ?  ?   ? ? ? ?Pertinent Vitals/Pain Pain Assessment ?Pain Assessment: Faces ?Faces Pain Scale: Hurts little more ?Pain Location: back ?Pain Descriptors / Indicators: Discomfort, Operative site guarding ?Pain Intervention(s): Limited activity within patient's tolerance, Monitored during session, Repositioned  ? ? ?Home Living Family/patient expects to be discharged to:: Private residence ?Living Arrangements: Spouse/significant other ?Available Help at Discharge: Family;Available 24 hours/day ?Type of Home: Other(Comment) (condo) ?Home Access: Stairs to enter ?Entrance Stairs-Rails: Can reach both ?Entrance Stairs-Number of Steps: 4 ?  ?Home Layout: One level ?Home Equipment: BSC/3in1;Shower seat - built in;Grab bars - toilet;Grab bars - tub/shower;Rollator (4 wheels);Rolling Walker (2 wheels) ?   ?  ?Prior Function Prior Level of Function : Independent/Modified Independent ?  ?  ?  ?  ?  ?  ?Mobility Comments: no AD ?ADLs Comments: reports ind ADLs, spouse completes IADLS ?  ? ? ?Hand Dominance  ?   ? ?  ?Extremity/Trunk Assessment  ? Upper Extremity Assessment ?Upper Extremity Assessment: Generalized weakness ?  ? ?Lower Extremity Assessment ?Lower Extremity Assessment: Generalized weakness ?  ? ?Cervical / Trunk Assessment ?Cervical / Trunk Assessment: Back Surgery;Other exceptions ?Cervical / Trunk Exceptions: Husband reports pt mobilizing and performing ADL's with an extremely flexed trunk PTA.  ?Communication  ? Communication: No difficulties  ?Cognition Arousal/Alertness: Awake/alert ?Behavior During Therapy: [facility] for tasks assessed/performed ?Overall Cognitive Status: Impaired/Different from baseline ?Area of Impairment: Memory, Problem solving, Safety/judgement ?  ?  ?  ?  ?  ?  ?   ?  ?  ?  ?Memory: Decreased short-term memory, Decreased recall of precautions ?  ?Safety/Judgement: Decreased awareness of safety ?  ?Problem Solving: Slow processing, Difficulty sequencing, Requires verbal cues ?General Comments: Slow processing, difficulty problem solving and physically aggressive at times (trying to yank walker away from therapist in frustration). ?  ?  ? ?  ?General Comments General comments (skin integrity, edema, etc.): Pt reports nausea; daughter asked RN for nausea meds. ? ?  ?Exercises    ? ?Assessment/Plan  ?  ?PT Assessment Patient needs continued PT services  ?PT Problem List Decreased strength;Decreased activity tolerance;Decreased balance;Decreased mobility;Decreased knowledge of use of DME;Decreased safety awareness;Decreased knowledge of precautions;Pain;Decreased cognition ? ?   ?  ?PT Treatment Interventions DME instruction;Gait training;Functional mobility training;Therapeutic activities;Therapeutic exercise;Balance training;Cognitive remediation;Patient/family education;Stair training   ? ?PT Goals (Current goals can be found in the Care Plan section)  ?Acute Rehab PT Goals ?Patient Stated Goal: Go back to sleep ?PT Goal Formulation: With patient/family ?Time For Goal Achievement: 10/16/21 ?Potential to Achieve Goals: Good ? ?  ?Frequency Min 5X/week ?  ? ? ?Co-evaluation   ?  ?  ?  ?  ? ? ?  ?AM-PAC PT "6 Clicks" Mobility  ?Outcome Measure Help needed turning from your back to your side while in a flat bed without using bedrails?: A Little ?Help needed moving from lying on your back to sitting on the side of a flat bed without using bedrails?: A Lot ?Help needed moving to and from a bed to a chair (including a wheelchair)?: A Little ?Help needed standing up from a chair using your arms (e.g., wheelchair  or bedside chair)?: A Little ?Help needed to walk in hospital room?: A Little ?Help needed climbing 3-5 steps with a railing? : Total ?6 Click Score: 15 ? ?  ?End of Session  Equipment Utilized During Treatment: Gait belt;Back brace ?Activity Tolerance: Patient limited by fatigue ?Patient left: in bed;with call bell/phone within reach;with family/visitor present ?Nurse Communication: Mobility s

## 2021-10-02 NOTE — Evaluation (Addendum)
Occupational Therapy Evaluation ?Patient Details ?Name: Mckenzie Lawrence ?MRN: [id number] ?DOB: 08-Feb-1946 ?Today's Date: 10/02/2021 ? ? ?History of Present Illness Pt is a 76 y/o female presenting on 4/17 s/p PLIF L4-5, L5-S1 and redo L3-4 discectomy. PMH includes: cataracts, COPD, CVA, DM, arthritis, HTN, orthostatic hypotension, prior back surgery, bil shoulder surgery.  ? ?Clinical Impression ?  ?PTA patient reports independent with ADLs and mobility, limited iADLs.  Admitted for above and presents with problem list below, including back pain and precautions, impaired balance, decreased activity tolerance and generalized weakness. Patient reports pain and nausea with limited mobility, but completes bed mobility, transfers using RW with min assist and ADLs with up to mod assist.  She demonstrates decreased recall of precautions and decreased problem solving. She reports she will have support of spouse at dc.  Based on performance today, believe she will benefit from further OT services acutely and after dc at [facility] to optimize independence and return to PLOF.  Will follow.   ? ?Recommendations for follow up therapy are one component of a multi-disciplinary discharge planning process, led by the attending physician.  Recommendations may be updated based on patient status, additional functional criteria and insurance authorization.  ? ?Follow Up Recommendations ? Home health OT  ?  ?Assistance Recommended at Discharge Frequent or constant Supervision/Assistance  ?Patient can return home with the following A little help with walking and/or transfers;A lot of help with bathing/dressing/bathroom;Assistance with cooking/housework;Assist for transportation;Help with stairs or ramp for entrance ? ?  ?Functional Status Assessment ? Patient has had a recent decline in their functional status and demonstrates the ability to make significant improvements in function in a reasonable and predictable amount of time.  ?Equipment  Recommendations ? Other (comment) (RW)  ?  ?Recommendations for Other Services PT consult ? ? ?  ?Precautions / Restrictions Precautions ?Precautions: Fall;Back ?Precaution Booklet Issued: Yes (comment) ?Precaution Comments: reviewed with pt, poor recall ?Required Braces or Orthoses: Spinal Brace ?Spinal Brace: Lumbar corset;Applied in sitting position ?Restrictions ?Weight Bearing Restrictions: No  ? ?  ? ?Mobility Bed Mobility ?Overal bed mobility: Needs Assistance ?Bed Mobility: Sidelying to Sit, Rolling, Sit to Sidelying ?Rolling: Supervision ?Sidelying to sit: Min guard ?  ?  ?Sit to sidelying: Min guard ?General bed mobility comments: for safety, cueing for technique with HOB elevated and use of rails. increased time required. ?  ? ?Transfers ?  ?  ?  ?  ?  ?  ?  ?  ?  ?  ?  ? ?  ?Balance Overall balance assessment: Needs assistance ?Sitting-balance support: No upper extremity supported, Feet supported ?Sitting balance-Leahy Scale: Good ?  ?  ?Standing balance support: Bilateral upper extremity supported, During functional activity ?Standing balance-Leahy Scale: Poor ?Standing balance comment: relies on UE support ?  ?  ?  ?  ?  ?  ?  ?  ?  ?  ?  ?   ? ?ADL either performed or assessed with clinical judgement  ? ?ADL Overall ADL's : Needs assistance/impaired ?  ?  ?Grooming: Set up;Sitting ?  ?  ?  ?  ?  ?Upper Body Dressing : Minimal assistance;Sitting ?Upper Body Dressing Details (indicate cue type and reason): brace mgmt ?Lower Body Dressing: Moderate assistance;Sit to/from stand ?Lower Body Dressing Details (indicate cue type and reason): requires assist for socks, min assist to stand ?Toilet Transfer: Minimal assistance;Ambulation;Rolling walker (2 wheels) ?Toilet Transfer Details (indicate cue type and reason): simulated side stepping at EOB, cueing for hand  placement and posture ?  ?  ?  ?  ?Functional mobility during ADLs: Minimal assistance;Rolling walker (2 wheels);Cueing for safety ?General ADL  Comments: pt limited by fatigue, nausea. educated on back precautions with poor recall and requires cueing.  poor tolerance.  ? ? ? ?Vision   ?Vision Assessment?: No apparent visual deficits  ?   ?Perception   ?  ?Praxis   ?  ? ?Pertinent Vitals/Pain Pain Assessment ?Pain Assessment: Faces ?Faces Pain Scale: Hurts little more ?Pain Location: back ?Pain Descriptors / Indicators: Discomfort, Operative site guarding ?Pain Intervention(s): Limited activity within patient's tolerance, Monitored during session, Repositioned  ? ? ? ?Hand Dominance   ?  ?Extremity/Trunk Assessment Upper Extremity Assessment ?Upper Extremity Assessment: Generalized weakness ?  ?Lower Extremity Assessment ?Lower Extremity Assessment: Defer to PT evaluation ?  ?Cervical / Trunk Assessment ?Cervical / Trunk Assessment: Back Surgery ?  ?Communication   ?  ?Cognition Arousal/Alertness: Awake/alert ?Behavior During Therapy: [facility] for tasks assessed/performed ?Overall Cognitive Status: Impaired/Different from baseline ?Area of Impairment: Memory, Problem solving ?  ?  ?  ?  ?  ?  ?  ?  ?  ?  ?Memory: Decreased short-term memory, Decreased recall of precautions ?  ?  ?  ?Problem Solving: Slow processing, Difficulty sequencing, Requires verbal cues ?General Comments: pt requires increased time to process and problem solve, poor flexibility to engage in ADLs (ie got up on different side of the bed then normal) and no recall of back precautions at end of session ?  ?  ?General Comments  pt reports nausea, RN notified.  Encouraged pt to eat breakfast, but pt declined ? ?  ?Exercises   ?  ?Shoulder Instructions    ? ? ?Home Living Family/patient expects to be discharged to:: Private residence ?Living Arrangements: Spouse/significant other ?Available Help at Discharge: Family;Available 24 hours/day ?Type of Home: Other(Comment) (condo) ?Home Access: Stairs to enter ?Entrance Stairs-Number of Steps: 4 ?Entrance Stairs-Rails: Can reach both ?Home Layout: One  level ?  ?  ?Bathroom Shower/Tub: Walk-in shower ?  ?Bathroom Toilet: Handicapped height ?Bathroom Accessibility: Yes ?  ?Home Equipment: BSC/3in1;Shower seat - built in;Grab bars - toilet;Grab bars - tub/shower ?  ?  ?  ? ?  ?Prior Functioning/Environment Prior Level of Function : Independent/Modified Independent ?  ?  ?  ?  ?  ?  ?Mobility Comments: no AD ?ADLs Comments: reports ind ADLs, spouse completes IADLS ?  ? ?  ?  ?OT Problem List: Decreased strength;Decreased activity tolerance;Impaired balance (sitting and/or standing);Decreased safety awareness;Decreased knowledge of precautions;Decreased knowledge of use of DME or AE;Decreased cognition;Pain ?  ?   ?OT Treatment/Interventions: Self-care/ADL training;Therapeutic exercise;DME and/or AE instruction;Therapeutic activities;Patient/family education;Balance training  ?  ?OT Goals(Current goals can be found in the care plan section) Acute Rehab OT Goals ?Patient Stated Goal: less pain ?OT Goal Formulation: With patient ?Time For Goal Achievement: 10/16/21 ?Potential to Achieve Goals: Good  ?OT Frequency: Min 2X/week ?  ? ?Co-evaluation   ?  ?  ?  ?  ? ?  ?AM-PAC OT "6 Clicks" Daily Activity     ?Outcome Measure Help from another person eating meals?: A Little ?Help from another person taking care of personal grooming?: A Little ?Help from another person toileting, which includes using toliet, bedpan, or urinal?: A Lot ?Help from another person bathing (including washing, rinsing, drying)?: A Lot ?Help from another person to put on and taking off regular upper body clothing?: A Little ?Help from another person  to put on and taking off regular lower body clothing?: A Lot ?6 Click Score: 15 ?  ?End of Session Equipment Utilized During Treatment: Rolling walker (2 wheels);Back brace ?Nurse Communication: Mobility status ? ?Activity Tolerance: Patient tolerated treatment well ?Patient left: in bed;with call bell/phone within reach ? ?OT Visit Diagnosis: Other  abnormalities of gait and mobility (R26.89);Muscle weakness (generalized) (M62.81);Pain ?Pain - part of body:  (back)  ?              ?Time: 2336-1224 ?OT Time Calculation (min): 17 min ?Charges:  OT General Charge

## 2021-10-02 NOTE — Progress Notes (Signed)
Subjective: ?The patient is sore.  I encouraged her to mobilize.  She is not ready to go home yet. ? ?Objective: ?Vital signs in last 24 hours: ?Temp:  [97.5 ?F (36.4 ?C)-98.5 ?F (36.9 ?C)] 98.4 ?F (36.9 ?C) (04/18 5462) ?Pulse Rate:  [67-83] 81 (04/18 0311) ?Resp:  [13-20] 18 (04/18 0311) ?BP: (106-165)/(43-70) 132/47 (04/18 0311) ?SpO2:  [96 %-100 %] 98 % (04/18 0311) ?Estimated body mass index is 30.89 kg/m? as calculated from the following: ?  Height as of 09/20/21: '5\' 6"'$  (1.676 m). ?  Weight as of 09/20/21: 86.8 kg. ? ? ?Intake/Output from previous day: ?04/17 0701 - 04/18 0700 ?In: 2580 [P.O.:480; I.V.:2000; IV Piggyback:100] ?Out: 600 [Urine:450; Blood:150] ?Intake/Output this shift: ?No intake/output data recorded. ? ?Physical exam the patient is alert and oriented.  Her lower extremity strength is grossly normal except for some giveaway. ? ?Lab Results: ?No results for input(s): WBC, HGB, HCT, PLT in the last 72 hours. ?BMET ?Recent Labs  ?  10/01/21 ?0701  ?NA 134*  ?K 4.4  ?CL 104  ?CO2 22  ?GLUCOSE 117*  ?BUN 17  ?CREATININE 2.07*  ?CALCIUM 9.0  ? ? ?Studies/Results: ?DG Lumbar Spine 2-3 Views ? ?Result Date: 10/01/2021 ?CLINICAL DATA:  [id number] Tech note: PLIF,IP,POSTERIOR INSTRUMENTATION L4-5, L5-S1, REDO L3-4 DISCECTOMY EXAM: LUMBAR SPINE - 2-3 VIEW COMPARISON:  MRI January 13, 2021. FINDINGS: Fluoro time: 25.5 seconds Reported radiation dose: 11.31 mGy Two C-arm fluoroscopic images were obtained intraoperatively and submitted for post operative interpretation. Transitional lumbosacral anatomy with partially sacralized S1 vertebral body (same numbering system as on the MRI from January 13, 2021). Using this numbering system, posterior pedicle screws at L4, L5 and S1 with intervening spacers at L4-L5 and L5-S1. Please see the performing provider's procedural report for further detail. IMPRESSION: Intraoperative fluoroscopy, as detailed above. Electronically Signed   By: Margaretha Sheffield M.D.   On: 10/01/2021 12:36   ? ?DG Lumbar Spine 2-3 Views ? ?Result Date: 10/01/2021 ?CLINICAL DATA:  Lateral view for intraoperative localization EXAM: LUMBAR SPINE - 2-3 VIEW COMPARISON:  08/28/2021 FINDINGS: Portable cross-table lateral view of lumbar spine was done for intraoperative localization. Tip of surgical probe is superimposed over the posterior elements at lumbosacral junction. IMPRESSION: Portable lateral view of lumbar spine was done for intraoperative localization. Electronically Signed   By: Elmer Picker M.D.   On: 10/01/2021 10:23  ? ?DG C-Arm 1-60 Min-No Report ? ?Result Date: 10/01/2021 ?Fluoroscopy was utilized by the requesting physician.  No radiographic interpretation.   ? ?Assessment/Plan: ?Postop day #1: I encouraged the patient to mobilize with PT and OT.  I also encouraged her to quit smoking. ? ?Renal insufficiency: Her creatinine increased to 2.07.  I will discontinue her Demadex.  I will plan to repeat her creatinine tomorrow. ? LOS: 1 day  ? ? ? ?[name] ?10/02/2021, 7:28 AM ? ? ? ? ?Patient ID: Mckenzie Lawrence, female   DOB: [birth year], 76 y.o.   MRN: [id number] ? ?

## 2021-10-03 LAB — BASIC METABOLIC PANEL
Anion gap: 6 (ref 5–15)
BUN: 25 mg/dL — ABNORMAL HIGH (ref 8–23)
CO2: 21 mmol/L — ABNORMAL LOW (ref 22–32)
Calcium: 8 mg/dL — ABNORMAL LOW (ref 8.9–10.3)
Chloride: 105 mmol/L (ref 98–111)
Creatinine, Ser: 2.39 mg/dL — ABNORMAL HIGH (ref 0.44–1.00)
GFR, Estimated: 21 mL/min — ABNORMAL LOW (ref >60)
Glucose, Bld: 137 mg/dL — ABNORMAL HIGH (ref 70–99)
Potassium: 4.3 mmol/L (ref 3.5–5.1)
Sodium: 132 mmol/L — ABNORMAL LOW (ref 135–145)

## 2021-10-03 LAB — URINALYSIS, ROUTINE W REFLEX MICROSCOPIC
Bilirubin Urine: NEGATIVE
Glucose, UA: 50 mg/dL — AB
Ketones, ur: NEGATIVE mg/dL
Nitrite: NEGATIVE
Protein, ur: 100 mg/dL — AB
Specific Gravity, Urine: 1.017 (ref 1.005–1.030)
pH: 5 (ref 5.0–8.0)

## 2021-10-03 LAB — GLUCOSE, CAPILLARY
Glucose-Capillary: 140 mg/dL — ABNORMAL HIGH (ref 70–99)
Glucose-Capillary: 122 mg/dL — ABNORMAL HIGH (ref 70–99)
Glucose-Capillary: 180 mg/dL — ABNORMAL HIGH (ref 70–99)
Glucose-Capillary: 136 mg/dL — ABNORMAL HIGH (ref 70–99)

## 2021-10-03 LAB — SODIUM, URINE, RANDOM: Sodium, Ur: 41 mmol/L

## 2021-10-03 LAB — PROTEIN / CREATININE RATIO, URINE
Creatinine, Urine: 108.74 mg/dL
Protein Creatinine Ratio: 1.39 mg/mg{Cre} — ABNORMAL HIGH (ref 0.00–0.15)
Total Protein, Urine: 151 mg/dL

## 2021-10-03 MED ORDER — SODIUM CHLORIDE 0.9 % IV SOLN: INTRAVENOUS | Status: AC

## 2021-10-03 NOTE — Consult Note (Addendum)
Reason for Consult:AKI ?Referring Physician: Dr. Arnoldo Morale  ? ?Chief Complaint: back pain  ? ?Assessment/Plan: ?AKI on CKD4 ?In the setting of recent spine surgery 2 days prior and not eating or drinking since surgery. On physical exam and history she appears prerenal. ?Cr 2.39<2.07<1.41. GFR 37. Baseline Cr ~ 2  ?UOP yesterday 330 ml. Net +1.89 L ?Follows with Dr. Holley Raring outpatient, Kidney function has been progressively declining. Was seen 9 days ago with GFR of 23, urine P/Cr 1.7 thought to be CKD4 secondary to diabetic nephropathy. ?Takes Torsemide '40mg'$  daily prn at home does not use often ?- hold Losartan for now given acute kidney injury. ?- avoid nephrotoxic agents   ?- strict Is and Os ?- monitor post void residual. If greater than 300, place foley. She has some incontinence if bladder scan PVR is >300 then she has obstruction likely from anesthesia.  ?- IVF 75 mL/h  ?- will check urine lytes  ? ?Lumbar radiculopathy, spondylolisthesis, spinal stenosis, DDD  ?Follows with neurosurgery  ?S/p L3-4 and L4-5 decompression, instrumentation fusion and L3-4 discectomy. ? ?HTN ?BP over last 24 hours have ranged from 131-158/36-53 ?On Losartan '100mg'$  daily, Metoprolol succinate 100 mg daily  ? ?DM2 ?A1c 6.2  ?On SSI, managed per primary team  ? ? ?  ?HPI: Mckenzie Lawrence is an 76 y.o. female who presents with back, L 1st toe, and R leg pain due to lumbar radiculopathy. Lumbar MRI and X rays showed spondylolisthesis, spinal stenosis, herniated disc and given she failed medical management opted for surgery.  ? ?Patient very tired when I examined her bedside this morning.  Daughter who is a Marine scientist provides majority of history.  She states that her mom follows with Dr. Holley Raring outpatient and just saw him a little over a week ago and he told her that her kidney function has been worsening.  Daughter states that she takes wide at home as needed if her feet swell up, but does not use it often, not even weekly.  Will take 1 to 2  tablets when she notices feet swelling but does not like the inconvenience of being tied to the restroom.  Daughter (nurse) states yesterday evening bladder scan was done with over 300 mLs of urine, and then when she went to the restroom only urinated 10 mL, though she did leak and urinate on the ground. Endorses urge to urinate, denies dysuria or hematuria.  Does endorse chronic constipation, last bowel movement about 4 days ago. ? ?ROS ?Per HPI ? ?Chemistry and CBC: ?Creatinine  ?Date/Time Value Ref Range Status  ?12/01/2011 04:56 PM 1.15 0.60 - 1.30 mg/dL Final  ? ?Creatinine, Ser  ?Date/Time Value Ref Range Status  ?10/03/2021 05:37 AM 2.39 (H) 0.44 - 1.00 mg/dL Final  ?10/01/2021 07:01 AM 2.07 (H) 0.44 - 1.00 mg/dL Final  ?05/06/2018 02:36 PM 1.41 (H) 0.44 - 1.00 mg/dL Final  ?09/27/2015 05:35 AM 1.05 (H) 0.44 - 1.00 mg/dL Final  ?09/26/2015 12:56 PM 1.18 (H) 0.44 - 1.00 mg/dL Final  ?09/26/2015 05:34 AM 1.09 (H) 0.44 - 1.00 mg/dL Final  ?09/25/2015 03:40 PM 1.31 (H) 0.44 - 1.00 mg/dL Final  ?03/16/2015 05:26 AM 1.19 (H) 0.44 - 1.00 mg/dL Final  ?03/15/2015 10:12 PM 1.22 (H) 0.44 - 1.00 mg/dL Final  ?03/11/2013 01:59 PM 1.1 0.4 - 1.2 mg/dL Final  ?04/05/2008 06:25 AM 0.91  Final  ? ?Recent Labs  ?Lab 10/01/21 ?0701 10/03/21 ?4709  ?NA 134* 132*  ?K 4.4 4.3  ?CL 104 105  ?  CO2 22 21*  ?GLUCOSE 117* 137*  ?BUN 17 25*  ?CREATININE 2.07* 2.39*  ?CALCIUM 9.0 8.0*  ? ?No results for input(s): WBC, NEUTROABS, HGB, HCT, MCV, PLT in the last 168 hours. ?Liver Function Tests: ?No results for input(s): AST, ALT, ALKPHOS, BILITOT, PROT, ALBUMIN in the last 168 hours. ?No results for input(s): LIPASE, AMYLASE in the last 168 hours. ?No results for input(s): AMMONIA in the last 168 hours. ?Cardiac Enzymes: ?No results for input(s): CKTOTAL, CKMB, CKMBINDEX, TROPONINI in the last 168 hours. ?Iron Studies: No results for input(s): IRON, TIBC, TRANSFERRIN, FERRITIN in the last 72 hours. ?PT/INR: ?'@LABRCNTIP'$ (inr:5) ? ?Xrays/Other  Studies: ?) ?Results for orders placed or performed during the hospital encounter of 10/01/21 (from the past 48 hour(s))  ?Glucose, capillary     Status: Abnormal  ? Collection Time: 10/01/21  1:28 PM  ?Result Value Ref Range  ? Glucose-Capillary 231 (H) 70 - 99 mg/dL  ?  Comment: Glucose reference range applies only to samples taken after fasting for at least 8 hours.  ?Glucose, capillary     Status: Abnormal  ? Collection Time: 10/01/21  2:06 PM  ?Result Value Ref Range  ? Glucose-Capillary 223 (H) 70 - 99 mg/dL  ?  Comment: Glucose reference range applies only to samples taken after fasting for at least 8 hours.  ?Hemoglobin A1c     Status: Abnormal  ? Collection Time: 10/01/21  4:17 PM  ?Result Value Ref Range  ? Hgb A1c MFr Bld 6.2 (H) 4.8 - 5.6 %  ?  Comment: (NOTE) ?Pre diabetes:          5.7%-6.4% ? ?Diabetes:              >6.4% ? ?Glycemic control for   <7.0% ?adults with diabetes ?  ? Mean Plasma Glucose 131.24 mg/dL  ?  Comment: Performed at [facility], [place] [address], [place], [place] [address]  ?Glucose, capillary     Status: Abnormal  ? Collection Time: 10/01/21  5:09 PM  ?Result Value Ref Range  ? Glucose-Capillary 251 (H) 70 - 99 mg/dL  ?  Comment: Glucose reference range applies only to samples taken after fasting for at least 8 hours.  ?Glucose, capillary     Status: Abnormal  ? Collection Time: 10/01/21  9:37 PM  ?Result Value Ref Range  ? Glucose-Capillary 236 (H) 70 - 99 mg/dL  ?  Comment: Glucose reference range applies only to samples taken after fasting for at least 8 hours.  ? Comment 1 Notify RN   ? Comment 2 Document in Chart   ?Glucose, capillary     Status: Abnormal  ? Collection Time: 10/02/21  6:04 AM  ?Result Value Ref Range  ? Glucose-Capillary 188 (H) 70 - 99 mg/dL  ?  Comment: Glucose reference range applies only to samples taken after fasting for at least 8 hours.  ? Comment 1 Notify RN   ? Comment 2 Document in Chart   ?Glucose, capillary     Status: Abnormal  ?  Collection Time: 10/02/21 11:40 AM  ?Result Value Ref Range  ? Glucose-Capillary 164 (H) 70 - 99 mg/dL  ?  Comment: Glucose reference range applies only to samples taken after fasting for at least 8 hours.  ?Glucose, capillary     Status: Abnormal  ? Collection Time: 10/02/21  4:13 PM  ?Result Value Ref Range  ? Glucose-Capillary 165 (H) 70 - 99 mg/dL  ?  Comment: Glucose reference range applies  only to samples taken after fasting for at least 8 hours.  ?Glucose, capillary     Status: Abnormal  ? Collection Time: 10/02/21  9:27 PM  ?Result Value Ref Range  ? Glucose-Capillary 166 (H) 70 - 99 mg/dL  ?  Comment: Glucose reference range applies only to samples taken after fasting for at least 8 hours.  ? Comment 1 Notify RN   ? Comment 2 Document in Chart   ?Basic metabolic panel     Status: Abnormal  ? Collection Time: 10/03/21  5:37 AM  ?Result Value Ref Range  ? Sodium 132 (L) 135 - 145 mmol/L  ? Potassium 4.3 3.5 - 5.1 mmol/L  ? Chloride 105 98 - 111 mmol/L  ? CO2 21 (L) 22 - 32 mmol/L  ? Glucose, Bld 137 (H) 70 - 99 mg/dL  ?  Comment: Glucose reference range applies only to samples taken after fasting for at least 8 hours.  ? BUN 25 (H) 8 - 23 mg/dL  ? Creatinine, Ser 2.39 (H) 0.44 - 1.00 mg/dL  ? Calcium 8.0 (L) 8.9 - 10.3 mg/dL  ? GFR, Estimated 21 (L) >60 mL/min  ?  Comment: (NOTE) ?Calculated using the CKD-EPI Creatinine Equation (2021) ?  ? Anion gap 6 5 - 15  ?  Comment: Performed at [facility], [place] [address], [place], [place] [address]  ?Glucose, capillary     Status: Abnormal  ? Collection Time: 10/03/21  6:00 AM  ?Result Value Ref Range  ? Glucose-Capillary 140 (H) 70 - 99 mg/dL  ?  Comment: Glucose reference range applies only to samples taken after fasting for at least 8 hours.  ? Comment 1 Notify RN   ? Comment 2 Document in Chart   ? ?DG Lumbar Spine 2-3 Views ? ?Result Date: 10/01/2021 ?CLINICAL DATA:  [id number] Tech note: PLIF,IP,POSTERIOR INSTRUMENTATION L4-5, L5-S1, REDO L3-4 DISCECTOMY EXAM:  LUMBAR SPINE - 2-3 VIEW COMPARISON:  MRI January 13, 2021. FINDINGS: Fluoro time: 25.5 seconds Reported radiation dose: 11.31 mGy Two C-arm fluoroscopic images were obtained intraoperatively and submitted for po

## 2021-10-03 NOTE — Progress Notes (Signed)
Subjective: ?The patient is alert and pleasant.  She continues to complain of back pain.  She has had some urinary incontinence when she stands.  She is slow to mobilize. ? ?Objective: ?Vital signs in last 24 hours: ?Temp:  [98.3 ?F (36.8 ?C)-99.8 ?F (37.7 ?C)] 99.3 ?F (37.4 ?C) (04/19 3220) ?Pulse Rate:  [79-99] 99 (04/19 0748) ?Resp:  [18] 18 (04/19 0748) ?BP: (131-158)/(36-53) 157/51 (04/19 0748) ?SpO2:  [92 %-98 %] 98 % (04/19 0748) ?Estimated body mass index is 30.89 kg/m? as calculated from the following: ?  Height as of 09/20/21: '5\' 6"'$  (1.676 m). ?  Weight as of 09/20/21: 86.8 kg. ? ? ?Intake/Output from previous day: ?04/18 0701 - 04/19 0700 ?In: 240 [P.O.:240] ?Out: 330 [Urine:330] ?Intake/Output this shift: ?No intake/output data recorded. ? ?Physical exam the patient is alert and pleasant.  Her dressing is clean and dry.  Her lower extremity strength is normal. ? ?Lab Results: ?No results for input(s): WBC, HGB, HCT, PLT in the last 72 hours. ?BMET ?Recent Labs  ?  10/01/21 ?0701 10/03/21 ?2542  ?NA 134* 132*  ?K 4.4 4.3  ?CL 104 105  ?CO2 22 21*  ?GLUCOSE 117* 137*  ?BUN 17 25*  ?CREATININE 2.07* 2.39*  ?CALCIUM 9.0 8.0*  ? ? ?Studies/Results: ?DG Lumbar Spine 2-3 Views ? ?Result Date: 10/01/2021 ?CLINICAL DATA:  [id number] Tech note: PLIF,IP,POSTERIOR INSTRUMENTATION L4-5, L5-S1, REDO L3-4 DISCECTOMY EXAM: LUMBAR SPINE - 2-3 VIEW COMPARISON:  MRI January 13, 2021. FINDINGS: Fluoro time: 25.5 seconds Reported radiation dose: 11.31 mGy Two C-arm fluoroscopic images were obtained intraoperatively and submitted for post operative interpretation. Transitional lumbosacral anatomy with partially sacralized S1 vertebral body (same numbering system as on the MRI from January 13, 2021). Using this numbering system, posterior pedicle screws at L4, L5 and S1 with intervening spacers at L4-L5 and L5-S1. Please see the performing provider's procedural report for further detail. IMPRESSION: Intraoperative fluoroscopy, as detailed  above. Electronically Signed   By: Margaretha Sheffield M.D.   On: 10/01/2021 12:36  ? ?DG Lumbar Spine 2-3 Views ? ?Result Date: 10/01/2021 ?CLINICAL DATA:  Lateral view for intraoperative localization EXAM: LUMBAR SPINE - 2-3 VIEW COMPARISON:  08/28/2021 FINDINGS: Portable cross-table lateral view of lumbar spine was done for intraoperative localization. Tip of surgical probe is superimposed over the posterior elements at lumbosacral junction. IMPRESSION: Portable lateral view of lumbar spine was done for intraoperative localization. Electronically Signed   By: Elmer Picker M.D.   On: 10/01/2021 10:23  ? ?DG C-Arm 1-60 Min-No Report ? ?Result Date: 10/01/2021 ?Fluoroscopy was utilized by the requesting physician.  No radiographic interpretation.   ? ?Assessment/Plan: ?Postop day #2: We will continue to mobilize her with PT and OT.  If she does not make any progress she may need rehab. ? ?Renal insufficiency: I will ask renal to see the patient.  I encouraged her to increase her fluid intake. ? LOS: 2 days  ? ? ? ?[name] ?10/03/2021, 8:21 AM ? ? ? ? ?Patient ID: Mckenzie Lawrence, female   DOB: Jan 10, 1946, 76 y.o.   MRN: [id number] ? ?

## 2021-10-03 NOTE — Progress Notes (Signed)
Transferred called report to receiving RN Saby on 5MW. Patient was transported down with all her belongings in her possession to include her CPap machine.  ?

## 2021-10-03 NOTE — Progress Notes (Signed)
Occupational Therapy Treatment ?Patient Details ?Name: Mckenzie Lawrence ?MRN: [id number] ?DOB: 1946-02-29 ?Today's Date: 10/03/2021 ? ? ?History of present illness Pt is a 76 y/o female who presents s/p L4-S1 PLIF on 10/01/2021. PMH signficant for cataracts, COPD, CVA, DM, arthritis, HTN, orthostatic hypotension, prior back surgery, bil shoulder surgery. ?  ?OT comments ? Pt making limited progress towards OT goals this session. Session dovetailed with PT to maximize pts participation and optimize pts activity tolerance. Pt continues to present with increased pain, decreased recall of precautions, impaired activity tolerance and generalized deconditioning. Pt lacks adequate safety awareness needing MAX cues to maintain back precautions during functional mobility tasks. Pt was able to mobilize OOB to bathroom and recliner with MAX encouragement and MIN A +2 for safety with RW. Pt currently requires MAX for all LB ADLS. Pt would continue to benefit from skilled occupational therapy while admitted and after d/c to address the below listed limitations in order to improve overall functional mobility and facilitate independence with BADL participation. DC plan remains appropriate, will follow acutely per POC.  ? ?  ? ?Recommendations for follow up therapy are one component of a multi-disciplinary discharge planning process, led by the attending physician.  Recommendations may be updated based on patient status, additional functional criteria and insurance authorization. ?   ?Follow Up Recommendations ? Home health OT  ?  ?Assistance Recommended at Discharge Frequent or constant Supervision/Assistance  ?Patient can return home with the following ? A little help with walking and/or transfers;A lot of help with bathing/dressing/bathroom;Assistance with cooking/housework;Assist for transportation;Help with stairs or ramp for entrance ?  ?Equipment Recommendations ? Other (comment) (RW)  ?  ?Recommendations for Other Services   ? ?   ?Precautions / Restrictions Precautions ?Precautions: Fall;Back ?Precaution Booklet Issued: Yes (comment) ?Precaution Comments: Verbally reviewed precautions during functional mobility. Poor carryover. ?Required Braces or Orthoses: Spinal Brace ?Spinal Brace: Lumbar corset;Applied in sitting position ?Restrictions ?Weight Bearing Restrictions: No  ? ? ?  ? ?Mobility Bed Mobility ?Overal bed mobility: Needs Assistance ?Bed Mobility: Rolling, Sidelying to Sit ?Rolling: Min assist ?Sidelying to sit: Mod assist ?  ?  ?  ?General bed mobility comments: step by step cues needed to sequence bed mobility tasks, MIN A to roll to sidelying to L side of bed, MOD A to elevate trunk with rest break needed in between. pt preferred to pull up on her husband needing education about trying to participate as much as possible ?  ? ?Transfers ?Overall transfer level: Needs assistance ?Equipment used: Rolling walker (2 wheels) ?Transfers: Sit to/from Stand ?Sit to Stand: Min assist, Mod assist ?  ?  ?  ?  ?  ?General transfer comment: MIN A to rise from elevate EOB; MOD A to rise from toilet with cues for hand placement and assist to steady RW as pt preferring to pull up on RW ?  ?  ?Balance Overall balance assessment: Needs assistance ?Sitting-balance support: No upper extremity supported, Feet supported ?Sitting balance-Leahy Scale: Fair ?  ?  ?Standing balance support: Bilateral upper extremity supported, During functional activity ?Standing balance-Leahy Scale: Poor ?Standing balance comment: relies on UE support ?  ?  ?  ?  ?  ?  ?  ?  ?  ?  ?  ?   ? ?ADL either performed or assessed with clinical judgement  ? ?ADL Overall ADL's : Needs assistance/impaired ?  ?  ?  ?  ?  ?  ?Lower Body Bathing: Maximal assistance;Sit to/from stand ?  Lower Body Bathing Details (indicate cue type and reason): simulated via LB dressing ?  ?  ?Lower Body Dressing: Maximal assistance;Sit to/from stand ?Lower Body Dressing Details (indicate cue type and  reason): to don new briefs ?Toilet Transfer: Minimal assistance;+2 for safety/equipment;Rolling walker (2 wheels);Regular Toilet ?Toilet Transfer Details (indicate cue type and reason): MIN A +2 for safety, cues for RW mgmt and encouragement needed to progress to bathroom ?Toileting- Clothing Manipulation and Hygiene: Maximal assistance;Sit to/from stand ?Toileting - Clothing Manipulation Details (indicate cue type and reason): unable to void but MAX A for clothing mgmt ?  ?  ?Functional mobility during ADLs: Minimal assistance;+2 for safety/equipment;+2 for physical assistance;Cueing for sequencing;Cueing for safety;Rolling walker (2 wheels) ?General ADL Comments: pt continues to present with decreased activity tolerance, self limiting behaviors and poor recall of education/ precautions ?  ? ?Extremity/Trunk Assessment Upper Extremity Assessment ?Upper Extremity Assessment: Generalized weakness ?  ?Lower Extremity Assessment ?Lower Extremity Assessment: Defer to PT evaluation ?  ?Cervical / Trunk Assessment ?Cervical / Trunk Assessment: Back Surgery;Other exceptions ?  ? ?Vision Patient Visual Report: No change from baseline;Other (comment) (keeps eyes closed) ?Vision Assessment?: No apparent visual deficits ?  ?Perception Perception ?Perception: Not tested ?  ?Praxis Praxis ?Praxis: Not tested ?  ? ?Cognition Arousal/Alertness: Awake/alert, Lethargic (moments of wakefulness and moments of lethargy) ?Behavior During Therapy: WFL for tasks assessed/performed, Agitated (moments of mild agitation/annoyance) ?Overall Cognitive Status: Impaired/Different from baseline ?Area of Impairment: Memory, Problem solving, Safety/judgement ?  ?  ?  ?  ?  ?  ?  ?  ?  ?  ?Memory: Decreased short-term memory, Decreased recall of precautions ?  ?Safety/Judgement: Decreased awareness of safety ?  ?Problem Solving: Slow processing, Difficulty sequencing, Requires verbal cues ?General Comments: pt keeping eyes closed a lot during  session, poor recall of precautions and resistant to session, needs MAX encouragment. easily frustrated when asked to do basic mobility tasks ?  ?  ?   ?Exercises   ? ?  ?Shoulder Instructions   ? ? ?  ?General Comments    ? ? ?Pertinent Vitals/ Pain       Pain Assessment ?Pain Assessment: Faces ?Faces Pain Scale: Hurts little more ?Pain Location: back ?Pain Descriptors / Indicators: Discomfort, Operative site guarding ?Pain Intervention(s): Limited activity within patient's tolerance, Monitored during session, Repositioned ? ?Home Living   ?  ?  ?  ?  ?  ?  ?  ?  ?  ?  ?  ?  ?  ?  ?  ?  ?  ?  ? ?  ?Prior Functioning/Environment    ?  ?  ?  ?   ? ?Frequency ? Min 2X/week  ? ? ? ? ?  ?Progress Toward Goals ? ?OT Goals(current goals can now be found in the care plan section) ? Progress towards OT goals: Not progressing toward goals - comment (self limiting at times) ? ?Acute Rehab OT Goals ?Patient Stated Goal: none stated ?Time For Goal Achievement: 10/16/21 ?Potential to Achieve Goals: Fair  ?Plan Discharge plan remains appropriate;Frequency remains appropriate   ? ?Co-evaluation ? ? ?   ?  ?  ?  ?  ? ?  ?AM-PAC OT "6 Clicks" Daily Activity     ?Outcome Measure ? ? Help from another person eating meals?: A Little ?Help from another person taking care of personal grooming?: A Lot ?Help from another person toileting, which includes using toliet, bedpan, or urinal?: A Lot ?Help from another  person bathing (including washing, rinsing, drying)?: A Lot ?Help from another person to put on and taking off regular upper body clothing?: A Lot ?Help from another person to put on and taking off regular lower body clothing?: Total ?6 Click Score: 12 ? ?  ?End of Session Equipment Utilized During Treatment: Gait belt;Rolling walker (2 wheels);Back brace ? ?OT Visit Diagnosis: Other abnormalities of gait and mobility (R26.89);Muscle weakness (generalized) (M62.81);Pain ?Pain - part of body:  (back) ?  ?Activity Tolerance Patient  tolerated treatment well ?  ?Patient Left in chair;with call bell/phone within reach;with family/visitor present ?  ?Nurse Communication Mobility status ?  ? ?   ? ?Time: 2081-3887 ?OT Time Calculation (min):

## 2021-10-03 NOTE — Progress Notes (Signed)
Physical Therapy Treatment ?Patient Details ?Name: Mckenzie Lawrence ?MRN: [id number] ?DOB: Jul 06, 1945 ?Today's Date: 10/03/2021 ? ? ?History of Present Illness Pt is a 76 y/o female who presents s/p L4-S1 PLIF on 10/01/2021. PMH signficant for cataracts, COPD, CVA, DM, arthritis, HTN, orthostatic hypotension, prior back surgery, bil shoulder surgery. ? ?  ?PT Comments  ? ? Pt progressing slowly towards physical therapy goals. Pt continues to be somewhat lethargic, with poor rehab effort demonstrated during all OOB mobility tasks. Pt easily frustrated/mildly agitated with therapist and not willing to progress mobility today, demonstrating poor safety awareness with attempting to suddenly sit down when chair was not present behind her. Pt would benefit from increased mobility and increased time OOB to chair. Strongly recommend pt be held accountable for completing her own feeding including holding her own cup, as staff reports family has been feeding her. Will continue to follow.  ?  ?Recommendations for follow up therapy are one component of a multi-disciplinary discharge planning process, led by the attending physician.  Recommendations may be updated based on patient status, additional functional criteria and insurance authorization. ? ?Follow Up Recommendations ? Home health PT ?  ?  ?Assistance Recommended at Discharge Frequent or constant Supervision/Assistance  ?Patient can return home with the following A little help with walking and/or transfers;Assistance with cooking/housework;Assist for transportation;Help with stairs or ramp for entrance ?  ?Equipment Recommendations ? None recommended by PT  ?  ?Recommendations for Other Services   ? ? ?  ?Precautions / Restrictions Precautions ?Precautions: Fall;Back ?Precaution Booklet Issued: Yes (comment) ?Precaution Comments: Verbally reviewed precautions during functional mobility. Poor carryover. ?Required Braces or Orthoses: Spinal Brace ?Spinal Brace: Lumbar  corset;Applied in sitting position ?Restrictions ?Weight Bearing Restrictions: No  ?  ? ?Mobility ? Bed Mobility ?  ?  ?  ?  ?  ?  ?  ?General bed mobility comments: Pt was received sitting up EOB with OT present. ?  ? ?Transfers ?Overall transfer level: Needs assistance ?Equipment used: Rolling walker (2 wheels) ?Transfers: Sit to/from Stand ?Sit to Stand: Min assist, Mod assist ?  ?  ?  ?  ?  ?General transfer comment: Heavy min-mod assist for power up to full stand. Pt with difficulty pushing up from seated surface and insistent on pulling to stand from walker. Therapist stabilizing walker for pt. ?  ? ?Ambulation/Gait ?Ambulation/Gait assistance: Min assist ?Gait Distance (Feet): 25 Feet ?Assistive device: Rolling walker (2 wheels) ?Gait Pattern/deviations: Step-through pattern, Decreased stride length, Trunk flexed ?Gait velocity: Very slow ?Gait velocity interpretation: <1.31 ft/sec, indicative of household ambulator ?  ?General Gait Details: Assist for appropriate walker positioning and walker management. Pt reports incontinence with standing (pt wearing a brief). Decreased safety awareness, suddenly deciding she was done with gait training and attempting to sit before a chair was in place behind her. Max cues to wait before sitting. ? ? ?Stairs ?  ?  ?  ?  ?  ? ? ?Wheelchair Mobility ?  ? ?Modified Rankin (Stroke Patients Only) ?  ? ? ?  ?Balance Overall balance assessment: Needs assistance ?Sitting-balance support: No upper extremity supported, Feet supported ?Sitting balance-Leahy Scale: Fair ?  ?  ?Standing balance support: Bilateral upper extremity supported, During functional activity ?Standing balance-Leahy Scale: Poor ?Standing balance comment: relies on UE support ?  ?  ?  ?  ?  ?  ?  ?  ?  ?  ?  ?  ? ?  ?Cognition Arousal/Alertness: Awake/alert, Lethargic (moments of  wakefulness and moments of lethargy) ?Behavior During Therapy: WFL for tasks assessed/performed, Agitated (moments of mild  agitation/annoyance) ?Overall Cognitive Status: Impaired/Different from baseline ?Area of Impairment: Memory, Problem solving, Safety/judgement ?  ?  ?  ?  ?  ?  ?  ?  ?  ?  ?Memory: Decreased short-term memory, Decreased recall of precautions ?  ?Safety/Judgement: Decreased awareness of safety ?  ?Problem Solving: Slow processing, Difficulty sequencing, Requires verbal cues ?General Comments: pt keeping eyes closed a lot during session, poor recall of precautions and resistant to session, needs MAX encouragment. easily frustrated when asked to do basic mobility tasks ?  ?  ? ?  ?Exercises   ? ?  ?General Comments   ?  ?  ? ?Pertinent Vitals/Pain Pain Assessment ?Pain Assessment: Faces ?Faces Pain Scale: Hurts little more ?Pain Location: back ?Pain Descriptors / Indicators: Discomfort, Operative site guarding ?Pain Intervention(s): Limited activity within patient's tolerance, Monitored during session, Repositioned  ? ? ?Home Living   ?  ?  ?  ?  ?  ?  ?  ?  ?  ?   ?  ?Prior Function    ?  ?  ?   ? ?PT Goals (current goals can now be found in the care plan section) Acute Rehab PT Goals ?Patient Stated Goal: None stated ?PT Goal Formulation: With patient/family ?Time For Goal Achievement: 10/16/21 ?Potential to Achieve Goals: Good ?Progress towards PT goals: Progressing toward goals ? ?  ?Frequency ? ? ? Min 5X/week ? ? ? ?  ?PT Plan Current plan remains appropriate  ? ? ?Co-evaluation   ?  ?  ?  ?  ? ?  ?AM-PAC PT "6 Clicks" Mobility   ?Outcome Measure ? Help needed turning from your back to your side while in a flat bed without using bedrails?: A Little ?Help needed moving from lying on your back to sitting on the side of a flat bed without using bedrails?: A Lot ?Help needed moving to and from a bed to a chair (including a wheelchair)?: A Little ?Help needed standing up from a chair using your arms (e.g., wheelchair or bedside chair)?: A Little ?Help needed to walk in hospital room?: A Little ?Help needed climbing  3-5 steps with a railing? : Total ?6 Click Score: 15 ? ?  ?End of Session Equipment Utilized During Treatment: Gait belt;Back brace ?Activity Tolerance: Patient limited by fatigue ?Patient left: in bed;with call bell/phone within reach;with family/visitor present ?Nurse Communication: Mobility status ?PT Visit Diagnosis: Unsteadiness on feet (R26.81);Pain ?Pain - part of body:  (back) ?  ? ? ?Time: 3329-5188 ?PT Time Calculation (min) (ACUTE ONLY): 20 min ? ?Charges:  $Gait Training: 8-22 mins          ?          ? ?Rolinda Roan, PT, DPT ?Acute Rehabilitation Services ?Secure Chat Preferred ?Office: [phone]  ? ? ?Thelma Comp ?10/03/2021, 2:44 PM ? ?

## 2021-10-04 LAB — URINALYSIS, ROUTINE W REFLEX MICROSCOPIC
Bilirubin Urine: NEGATIVE
Glucose, UA: NEGATIVE mg/dL
Hgb urine dipstick: NEGATIVE
Ketones, ur: NEGATIVE mg/dL
Leukocytes,Ua: NEGATIVE
Nitrite: NEGATIVE
Protein, ur: 100 mg/dL — AB
Specific Gravity, Urine: 1.015 (ref 1.005–1.030)
pH: 5 (ref 5.0–8.0)

## 2021-10-04 LAB — BASIC METABOLIC PANEL
Anion gap: 8 (ref 5–15)
BUN: 25 mg/dL — ABNORMAL HIGH (ref 8–23)
CO2: 20 mmol/L — ABNORMAL LOW (ref 22–32)
Calcium: 7.8 mg/dL — ABNORMAL LOW (ref 8.9–10.3)
Chloride: 106 mmol/L (ref 98–111)
Creatinine, Ser: 2.28 mg/dL — ABNORMAL HIGH (ref 0.44–1.00)
GFR, Estimated: 22 mL/min — ABNORMAL LOW (ref >60)
Glucose, Bld: 122 mg/dL — ABNORMAL HIGH (ref 70–99)
Potassium: 4.1 mmol/L (ref 3.5–5.1)
Sodium: 134 mmol/L — ABNORMAL LOW (ref 135–145)

## 2021-10-04 LAB — GLUCOSE, CAPILLARY
Glucose-Capillary: 121 mg/dL — ABNORMAL HIGH (ref 70–99)
Glucose-Capillary: 110 mg/dL — ABNORMAL HIGH (ref 70–99)
Glucose-Capillary: 136 mg/dL — ABNORMAL HIGH (ref 70–99)
Glucose-Capillary: 124 mg/dL — ABNORMAL HIGH (ref 70–99)

## 2021-10-04 MED ORDER — SODIUM CHLORIDE 0.9 % IV SOLN
1.0000 g | INTRAVENOUS | Status: DC
  Administered 2021-10-04 – 2021-10-06 (×3): 1 g via INTRAVENOUS
  Filled 2021-10-04 (×4): qty 10

## 2021-10-04 MED ORDER — SODIUM CHLORIDE 0.9 % IV SOLN: INTRAVENOUS | Status: AC

## 2021-10-04 NOTE — Progress Notes (Signed)
IP rehab admissions - I met with patient, her spouse and her daughter at the bedside.  I gave the rehab booklet and talked about rehab options.  I spoke with therapy.  Will check benefits and wait for updated therapy notes today before deciding on CIR versus HH therapies.  Patient and family are open to admitting to CIR if needed.  Call for questions.  [phone] ?

## 2021-10-04 NOTE — Care Management Important Message (Signed)
Important Message ? ?Patient Details  ?Name: Mckenzie Lawrence ?MRN: [id number] ?Date of Birth: 04/25/1946 ? ? ?Medicare Important Message Given:  Yes ? ? ? ? ?Marthe Dant ?10/04/2021, 1:59 PM ?

## 2021-10-04 NOTE — Progress Notes (Addendum)
?[facility] ?Progress Note  ? ? ?Assessment/ Plan:   ? ?AKI on CKD4 ?Likely pre renal In the setting of recent spine surgery and not eating or drinking since surgery. On physical exam and history she appears prerenal. ?Cr 2.28<2.39<2.07<1.41. GFR 37. Baseline Cr ~ 2  ?UA with 50 glucose, small hgb, 100 protein, mod leuks  ?Urine Na 41, P/Cr 1.39 indicating significant proteinuria  ?UOP yesterday with 3 unmeasured voids.  ?Follows with Dr. Holley Raring outpatient, Kidney function has been progressively declining. Was seen 9 days ago with GFR of 23, urine P/Cr 1.7 thought to be CKD4 secondary to diabetic nephropathy. ?Takes Torsemide '40mg'$  daily prn at home does not use often ? ?- hold Losartan for now given acute kidney injury -> function slowly improving but may be from prerenal azotemia as well.. ?- avoid nephrotoxic agents   ?- strict Is and Os ?- monitor post void residual. If greater than 300, place foley. She has some incontinence and would prefer not to instrument. Certainly possible she had functional obstruction from anesthesia but that should be improved by now.  ?- IVF increased 125 IVF; no signs of overload and actually appears hypovolemic.  ?- f/u urine culture -> once sent will start Ceftriaxone ?  ?Lumbar radiculopathy, spondylolisthesis, spinal stenosis, DDD  ?Follows with neurosurgery  ?S/p L3-4 and L4-5 decompression, instrumentation fusion and L3-4 discectomy. ?  ?HTN ?BP over last 24 hours have ranged from 131-158/36-53 ?On Losartan '100mg'$  daily, Metoprolol succinate 100 mg daily  ?- holding Losartan due to AKI  ?  ?DM2 ?A1c 6.2  ?On SSI, managed per primary team  ? ?Subjective:   ?Patient feels fine. States she is still not eating or drinking but no nausea. Denies dysuria just feels fullness. She does state that she has been on nitrofurantoin daily for frequent UTIs but it was stopped about a week ago and she has just been taking cranberry tablets with improvement per daughter. Daughter  wondering if she can continue them in the hospital.   ? ?Objective:   ?BP (!) 161/52   Pulse 86   Temp 98 ?F (36.7 ?C)   Resp 17   Ht '5\' 6"'$  (1.676 m)   Wt 90.7 kg   SpO2 96%   BMI 32.27 kg/m?  ? ?Intake/Output Summary (Last 24 hours) at 10/04/2021 0833 ?Last data filed at 10/04/2021 0600 ?Gross per 24 hour  ?Intake 1410.28 ml  ?Output --  ?Net 1410.28 ml  ? ?Weight change:  ? ?Physical Exam: ?General: alert, sitting in bed tired, NAD ?CV: RRR no murmurs ?Resp: CTAB normal WOB  ?GI: soft, non distended. Mildly tender to palpation in suprapubic area  ?Extremities: warm, dry. Moving spontaneously and equally  ? ?Imaging: ?No results found. ? ?Labs: ?BMET ?Recent Labs  ?Lab 10/01/21 ?0701 10/03/21 ?6144  ?NA 134* 132*  ?K 4.4 4.3  ?CL 104 105  ?CO2 22 21*  ?GLUCOSE 117* 137*  ?BUN 17 25*  ?CREATININE 2.07* 2.39*  ?CALCIUM 9.0 8.0*  ? ?CBC ?No results for input(s): WBC, NEUTROABS, HGB, HCT, MCV, PLT in the last 168 hours. ? ?Medications:   ? ? calcitRIOL  0.25 mcg Oral Daily  ? docusate sodium  100 mg Oral BID  ? hydrALAZINE  25 mg Oral TID  ? insulin aspart  0-15 Units Subcutaneous TID WC  ? insulin aspart  0-5 Units Subcutaneous QHS  ? metoprolol succinate  100 mg Oral Daily  ? mirtazapine  7.5 mg Oral QHS  ? multivitamin  1 tablet Oral Daily  ? oxybutynin  5 mg Oral QHS  ? pantoprazole  40 mg Oral Daily  ? rosuvastatin  5 mg Oral Daily  ? sodium chloride flush  3 mL Intravenous Q12H  ? ? ? ? ?Otelia Santee, MD ?10/04/2021, 8:33 AM   ?

## 2021-10-04 NOTE — Progress Notes (Signed)
IP rehab admissions:  Psychologist, occupational has offered a peer to peer for Dr. Arnoldo Morale regarding inpatient rehab admission.  Please call [phone] to speak with Dr. Amalia Hailey at [facility] team advantage at your earliest opportunity.  Call me for questions.  [phone] ?

## 2021-10-04 NOTE — Progress Notes (Signed)
Subjective: ?The patient is alert and pleasant.  She looks a bit better today.  She continues to complain of back pain.  She continues to have some stress urinary incontinence. ? ?Objective: ?Vital signs in last 24 hours: ?Temp:  [98 ?F (36.7 ?C)-99.8 ?F (37.7 ?C)] 98 ?F (36.7 ?C) (04/20 0445) ?Pulse Rate:  [86-105] 86 (04/20 0445) ?Resp:  [17-19] 17 (04/20 0445) ?BP: (131-161)/(39-67) 161/52 (04/20 0445) ?SpO2:  [96 %-100 %] 96 % (04/20 0445) ?Weight:  [90.7 kg] 90.7 kg (04/19 2005) ?Estimated body mass index is 32.27 kg/m? as calculated from the following: ?  Height as of this encounter: '5\' 6"'$  (1.676 m). ?  Weight as of this encounter: 90.7 kg. ? ? ?Intake/Output from previous day: ?04/19 0701 - 04/20 0700 ?In: 1410.3 [P.O.:240; I.V.:1170.3] ?Out: -  ?Intake/Output this shift: ?No intake/output data recorded. ? ?Physical exam the patient is alert and pleasant.  Her strength is normal. ? ?Lab Results: ?No results for input(s): WBC, HGB, HCT, PLT in the last 72 hours. ?BMET ?Recent Labs  ?  10/03/21 ?1324  ?NA 132*  ?K 4.3  ?CL 105  ?CO2 21*  ?GLUCOSE 137*  ?BUN 25*  ?CREATININE 2.39*  ?CALCIUM 8.0*  ? ? ?Studies/Results: ?No results found. ? ?Assessment/Plan: ?Postop day #3: The patient is slowly improving as expected. ? ?Renal insufficiency: I appreciate renal's input-management of this. ? ?I will ask rehab to see the patient to see if she is a candidate for inpatient rehab. ? LOS: 3 days  ? ? ? ?[name] ?10/04/2021, 8:20 AM ? ? ? ? ?Patient ID: Mckenzie Lawrence, female   DOB: 09-11-1945, 76 y.o.   MRN: [id number] ? ?

## 2021-10-04 NOTE — Progress Notes (Signed)
Physical Therapy Treatment ?Patient Details ?Name: Mckenzie Lawrence ?MRN: [id number] ?DOB: 22-Jul-1945 ?Today's Date: 10/04/2021 ? ? ?History of Present Illness Pt is a 76 y/o female who presents s/p L4-S1 PLIF on 10/01/2021. PMH signficant for cataracts, COPD, CVA, DM, arthritis, HTN, orthostatic hypotension, prior back surgery, bil shoulder surgery. ? ?  ?PT Comments  ? ? Patient continues to make slow incremental progress towards physical therapy goals. Patient required modA for bed mobility and minA to stand. Ambulated 85' with RW and minA +2 with max encouragement to continue going despite agitation. Patient able to recall 2/3 precautions and required verbal review of back precautions prior to mobility. Updated recommendation to AIR as patient was independent PTA and would benefit from intensive therapy to assist with return to PLOF.  ?   ?Recommendations for follow up therapy are one component of a multi-disciplinary discharge planning process, led by the attending physician.  Recommendations may be updated based on patient status, additional functional criteria and insurance authorization. ? ?Follow Up Recommendations ? Acute inpatient rehab (3hours/day) ?  ?  ?Assistance Recommended at Discharge Frequent or constant Supervision/Assistance  ?Patient can return home with the following A little help with walking and/or transfers;Assistance with cooking/housework;Assist for transportation;Help with stairs or ramp for entrance ?  ?Equipment Recommendations ? None recommended by PT  ?  ?Recommendations for Other Services   ? ? ?  ?Precautions / Restrictions Precautions ?Precautions: Fall;Back ?Precaution Booklet Issued: Yes (comment) ?Precaution Comments: able to recall 2/3 precautions; verbally reviewed precautions ?Required Braces or Orthoses: Spinal Brace ?Spinal Brace: Lumbar corset;Applied in sitting position ?Restrictions ?Weight Bearing Restrictions: No  ?  ? ?Mobility ? Bed Mobility ?Overal bed mobility: Needs  Assistance ?Bed Mobility: Rolling, Sidelying to Sit ?Rolling: Min guard ?Sidelying to sit: Mod assist ?  ?  ?  ?General bed mobility comments: cues for log roll technique. ModA for trunk elevation with use of bed rails to assist ?  ? ?Transfers ?Overall transfer level: Needs assistance ?Equipment used: Rolling Delorice Bannister (2 wheels) ?Transfers: Sit to/from Stand ?Sit to Stand: Min assist ?  ?  ?  ?  ?  ?General transfer comment: use of momentum but unsuccessful on first attempt. MinA to rise from EOB. Cues for hand placement prior to standing ?  ? ?Ambulation/Gait ?Ambulation/Gait assistance: Min assist, +2 safety/equipment ?Gait Distance (Feet): 35 Feet ?Assistive device: Rolling Nickolis Diel (2 wheels) ?Gait Pattern/deviations: Step-through pattern, Decreased stride length, Trunk flexed ?Gait velocity: decreased ?  ?  ?General Gait Details: assist for RW and balance. patient provided target and she immediately became agitated. When getting closer to target, she increased gait speed to quickly reach target but unsafe. Cues for slowing speed down for safety. ? ? ?Stairs ?  ?  ?  ?  ?  ? ? ?Wheelchair Mobility ?  ? ?Modified Rankin (Stroke Patients Only) ?  ? ? ?  ?Balance Overall balance assessment: Needs assistance ?Sitting-balance support: No upper extremity supported, Feet supported ?Sitting balance-Leahy Scale: Fair ?  ?  ?Standing balance support: Bilateral upper extremity supported, During functional activity ?Standing balance-Leahy Scale: Poor ?Standing balance comment: relies on UE support ?  ?  ?  ?  ?  ?  ?  ?  ?  ?  ?  ?  ? ?  ?Cognition Arousal/Alertness: Awake/alert ?Behavior During Therapy: [facility] for tasks assessed/performed, Agitated ?Overall Cognitive Status: Impaired/Different from baseline ?Area of Impairment: Memory, Problem solving, Safety/judgement ?  ?  ?  ?  ?  ?  ?  ?  ?  ?  ?  Memory: Decreased short-term memory, Decreased recall of precautions ?  ?Safety/Judgement: Decreased awareness of safety ?   ?Problem Solving: Slow processing, Difficulty sequencing, Requires verbal cues ?General Comments: easily agitated when therapist attempts to progress patient from previous session. Slow processing noted required cues for sequencing ?  ?  ? ?  ?Exercises   ? ?  ?General Comments   ?  ?  ? ?Pertinent Vitals/Pain Pain Assessment ?Pain Assessment: 0-10 ?Pain Score: 3  ?Pain Location: back ?Pain Descriptors / Indicators: Discomfort, Operative site guarding ?Pain Intervention(s): Monitored during session  ? ? ?Home Living   ?  ?  ?  ?  ?  ?  ?  ?  ?  ?   ?  ?Prior Function    ?  ?  ?   ? ?PT Goals (current goals can now be found in the care plan section) Acute Rehab PT Goals ?Patient Stated Goal: None stated ?PT Goal Formulation: With patient/family ?Time For Goal Achievement: 10/16/21 ?Potential to Achieve Goals: Good ?Progress towards PT goals: Progressing toward goals ? ?  ?Frequency ? ? ? Min 5X/week ? ? ? ?  ?PT Plan Discharge plan needs to be updated  ? ? ?Co-evaluation   ?  ?  ?  ?  ? ?  ?AM-PAC PT "6 Clicks" Mobility   ?Outcome Measure ? Help needed turning from your back to your side while in a flat bed without using bedrails?: A Little ?Help needed moving from lying on your back to sitting on the side of a flat bed without using bedrails?: A Lot ?Help needed moving to and from a bed to a chair (including a wheelchair)?: A Little ?Help needed standing up from a chair using your arms (e.g., wheelchair or bedside chair)?: A Little ?Help needed to walk in hospital room?: A Little ?Help needed climbing 3-5 steps with a railing? : Total ?6 Click Score: 15 ? ?  ?End of Session Equipment Utilized During Treatment: Gait belt;Back brace ?Activity Tolerance: Patient limited by fatigue ?Patient left: in chair;with call bell/phone within reach;with chair alarm set;with family/visitor present ?Nurse Communication: Mobility status ?PT Visit Diagnosis: Unsteadiness on feet (R26.81);Pain ?  ? ? ?Time: 0932-6712 ?PT Time  Calculation (min) (ACUTE ONLY): 14 min ? ?Charges:  $Gait Training: 8-22 mins          ?          ? ?Domanick Cuccia A. Gilford Rile, PT, DPT ?Acute Rehabilitation Services ?Pager [phone] ?Office [phone] ? ? ? ?Dakiyah Heinke A Owen Pratte ?10/04/2021, 1:19 PM ? ?

## 2021-10-04 NOTE — PMR Pre-admission (Shared)
PMR Admission Coordinator Pre-Admission Assessment ? ?Patient: Mckenzie Lawrence is an 76 y.o., female ?MRN: [id number] ?DOB: Aug 15, 1945 ?Height: '5\' 6"'  (167.6 cm) ?Weight: 90.7 kg ? ?Insurance Information ?HMO: Yes    PPO:       PCP:       IPA:       80/20:       OTHER:  Group # [id number] ?PRIMARY: [employer]      Policy#: [id number]      Subscriber: patient ?CM Name: ***      Phone#: ***     Fax#: [phone] ?Pre-Cert#: ***      Employer: Retired ?Benefits:  Phone #: [phone]     Name: Deidre Ala ?Eff. Date: 06/17/21     Deduct: $0      Out of Pocket Max: $5000 (met $0)      Life Max: N/A ?CIR: $225 days 1-6      SNF: $0 days 1-20; $184 days 21-100 ?Outpatient: [facility]     Co-Pay: $20/visit ?Home Health: 80%      Co-Pay: 20% ?DME: 80%     Co-Pay: 20% ?Providers: in network ? ?SECONDARY:       Policy#:      Phone#:  ? ?Financial Counselor:       Phone#:  ? ?The ?Data Collection Information Summary? for patients in Inpatient Rehabilitation Facilities with attached ?Privacy Act [place] Records? was provided and verbally reviewed with: Patient and Family ? ?Emergency Contact Information ?Contact Information   ? ? Name Relation Home Work Mobile  ? Grandt,Neil Spouse [phone]  [phone]  ? Bowers,[name] Daughter [phone]  [phone]  ? Estrella Myrtle   [phone]  ? ?  ? ? ?Current Medical History  ?Patient Admitting Diagnosis: L4-S1 PLIF ? ?History of Present Illness: A 76 year old white female who admitted on 10/01/21 with complaints of back and left greater than right leg pain consistent with neurogenic claudication/lumbar radiculopathy.  She has failed medical management and was worked up with a lumbar MRI and lumbar x-rays which demonstrated she had spondylolisthesis, spinal stenosis, and herniated disc.  Patient underwent L4-S1 PLIF on 10/01/21 by Dr. Arnoldo Morale.  PT/OT evaluations were completed post op with recommendations for post acute rehab admission. ? ?Patient's medical  record from [facility] has been reviewed by the rehabilitation admission coordinator and physician. ? ?Past Medical History  ?Past Medical History:  ?Diagnosis Date  ? Arthritis   ? Bladder incontinence   ? Broken foot   ? Cataracts, bilateral   ? Chronic kidney insufficiency   ? COPD (chronic obstructive pulmonary disease) ([place])   ? wheezing  ? CVA (cerebral vascular accident) ([place]) 2016  ? Diabetes mellitus   ? insulin dependent, Type 2  ? HLD (hyperlipidemia)   ? Hx of cardiovascular stress test   ? a. ETT (6/13):  Ex 5:13; no ischemic changes  ? Hypertension   ? controlled on meds  ? Lacunar stroke of left subthalamic region [facility]) 02/2015  ? Leg pain   ? left  ? Lower back pain   ? Neuromuscular disorder ([place])   ? stroke right hand tingling  ? Orthostatic hypotension   ? Osteopenia 01/2017  ? T score -2.0 stable from prior DEXA  ? PCOS (polycystic ovarian syndrome)   ? Personal history of tobacco use, presenting hazards to health 01/09/2015  ? Sleep apnea   ? borderline/ CPAP  ? ? ?Has the patient had major surgery during 100 days prior to admission? Yes ? ?Family  History   ?family history includes Cancer in her sister; Diabetes in her brother, brother, father, and sister; Heart disease (age of onset: 58) in her brother; Heart disease (age of onset: 56) in her mother; Hypertension in her brother, mother, and sister. ? ?Current Medications ? ?Current Facility-Administered Medications:  ?  0.9 %  sodium chloride infusion, 250 mL, Intravenous, Continuous, Bergman, Meghan D, NP ?  acetaminophen (TYLENOL) tablet 650 mg, 650 mg, Oral, Q4H PRN **OR** acetaminophen (TYLENOL) suppository 650 mg, 650 mg, Rectal, Q4H PRN, Bergman, Meghan D, NP ?  albuterol (PROVENTIL) (2.5 MG/3ML) 0.083% nebulizer solution 2.5 mg, 2.5 mg, Nebulization, Q6H PRN, Pham, Minh Q, RPH-CPP ?  bisacodyl (DULCOLAX) suppository 10 mg, 10 mg, Rectal, Daily PRN, Reinaldo Meeker, Meghan D, NP ?  calcitRIOL (ROCALTROL) capsule 0.25 mcg, 0.25 mcg, Oral, Daily,  Bergman, Meghan D, NP, 0.25 mcg at 10/04/21 0824 ?  cefTRIAXone (ROCEPHIN) 1 g in sodium chloride 0.9 % 100 mL IVPB, 1 g, Intravenous, Q24H, Dwana Melena, MD, Last Rate: 200 mL/hr at 10/04/21 1306, 1 g at 10/04/21 1306 ?  cyclobenzaprine (FLEXERIL) tablet 10 mg, 10 mg, Oral, TID PRN, Viona Gilmore D, NP, 10 mg at 10/03/21 1650 ?  docusate sodium (COLACE) capsule 100 mg, 100 mg, Oral, BID, Bergman, Meghan D, NP, 100 mg at 10/04/21 3358 ?  hydrALAZINE (APRESOLINE) tablet 25 mg, 25 mg, Oral, TID, Bergman, Meghan D, NP, 25 mg at 10/04/21 0824 ?  hydrOXYzine (VISTARIL) injection 50 mg, 50 mg, Intramuscular, Q6H PRN, Newman Pies, MD, 50 mg at 10/01/21 2142 ?  insulin aspart (novoLOG) injection 0-15 Units, 0-15 Units, Subcutaneous, TID WC, Bergman, Meghan D, NP, 2 Units at 10/04/21 0824 ?  insulin aspart (novoLOG) injection 0-5 Units, 0-5 Units, Subcutaneous, QHS, Newman Pies, MD, 2 Units at 10/01/21 2153 ?  menthol-cetylpyridinium (CEPACOL) lozenge 3 mg, 1 lozenge, Oral, PRN **OR** phenol (CHLORASEPTIC) mouth spray 1 spray, 1 spray, Mouth/Throat, PRN, Bergman, Meghan D, NP ?  metoprolol succinate (TOPROL-XL) 24 hr tablet 100 mg, 100 mg, Oral, Daily, Bergman, Meghan D, NP, 100 mg at 10/04/21 0824 ?  mirtazapine (REMERON) tablet 7.5 mg, 7.5 mg, Oral, QHS, Bergman, Meghan D, NP, 7.5 mg at 10/03/21 2247 ?  morphine (PF) 4 MG/ML injection 4 mg, 4 mg, Intravenous, Q2H PRN, Bergman, Meghan D, NP, 4 mg at 10/03/21 2023 ?  multivitamin (PROSIGHT) tablet 1 tablet, 1 tablet, Oral, Daily, Viona Gilmore D, NP, 1 tablet at 10/04/21 2518 ?  ondansetron (ZOFRAN) tablet 4 mg, 4 mg, Oral, Q6H PRN, 4 mg at 10/02/21 1512 **OR** ondansetron (ZOFRAN) injection 4 mg, 4 mg, Intravenous, Q6H PRN, Reinaldo Meeker, Meghan D, NP, 4 mg at 10/04/21 1008 ?  oxybutynin (DITROPAN) tablet 5 mg, 5 mg, Oral, QHS, Bergman, Meghan D, NP, 5 mg at 10/04/21 0056 ?  oxyCODONE (Oxy IR/ROXICODONE) immediate release tablet 10 mg, 10 mg, Oral, Q3H PRN, Reinaldo Meeker,  Meghan D, NP, 10 mg at 10/04/21 1028 ?  oxyCODONE (Oxy IR/ROXICODONE) immediate release tablet 5 mg, 5 mg, Oral, Q3H PRN, Reinaldo Meeker, Meghan D, NP, 5 mg at 10/03/21 1003 ?  pantoprazole (PROTONIX) EC tablet 40 mg, 40 mg, Oral, Daily, Bergman, Meghan D, NP, 40 mg at 10/04/21 0824 ?  rosuvastatin (CRESTOR) tablet 5 mg, 5 mg, Oral, Daily, Bergman, Meghan D, NP, 5 mg at 10/04/21 0824 ?  sodium chloride flush (NS) 0.9 % injection 3 mL, 3 mL, Intravenous, Q12H, Bergman, Meghan D, NP, 3 mL at 10/03/21 2200 ?  sodium chloride flush (NS) 0.9 %  injection 3 mL, 3 mL, Intravenous, PRN, Bergman, Meghan D, NP ?  zolpidem (AMBIEN) tablet 5 mg, 5 mg, Oral, QHS PRN, Reinaldo Meeker, Meghan D, NP, 5 mg at 10/03/21 1005 ? ?Patients Current Diet:  ?Diet Order   ? ?       ?  Diet Carb Modified Fluid consistency: Thin; Room service appropriate? Yes  Diet effective now       ?  ? ?  ?  ? ?  ? ? ?Precautions / Restrictions ?Precautions ?Precautions: Fall, Back ?Precaution Booklet Issued: Yes (comment) ?Precaution Comments: able to recall 2/3 precautions; verbally reviewed precautions ?Spinal Brace: Lumbar corset, Applied in sitting position ?Restrictions ?Weight Bearing Restrictions: No  ? ?Has the patient had 2 or more falls or a fall with injury in the past year? Yes ? ?Prior Activity Level ?Community (5-7x/wk): Went out daily, was driving. ? ?Prior Functional Level ?Self Care: Did the patient need help bathing, dressing, using the toilet or eating? Independent ? ?Indoor Mobility: Did the patient need assistance with walking from room to room (with or without device)? Independent ? ?Stairs: Did the patient need assistance with internal or external stairs (with or without device)? Independent ? ?Functional Cognition: Did the patient need help planning regular tasks such as shopping or remembering to take medications? Independent ? ?Patient Information ?Are you of Hispanic, Latino/a,or Spanish origin?: A. No, not of Hispanic, Latino/a, or Spanish  origin ?What is your race?: A. White ?Do you need or want an interpreter to communicate with a doctor or health care staff?: 0. No ? ?Patient's Response To:  ?Health Literacy and Transportation ?Is the patie

## 2021-10-05 LAB — BASIC METABOLIC PANEL
Anion gap: 6 (ref 5–15)
BUN: 24 mg/dL — ABNORMAL HIGH (ref 8–23)
CO2: 18 mmol/L — ABNORMAL LOW (ref 22–32)
Calcium: 7.5 mg/dL — ABNORMAL LOW (ref 8.9–10.3)
Chloride: 108 mmol/L (ref 98–111)
Creatinine, Ser: 2.1 mg/dL — ABNORMAL HIGH (ref 0.44–1.00)
GFR, Estimated: 24 mL/min — ABNORMAL LOW (ref >60)
Glucose, Bld: 111 mg/dL — ABNORMAL HIGH (ref 70–99)
Potassium: 4 mmol/L (ref 3.5–5.1)
Sodium: 132 mmol/L — ABNORMAL LOW (ref 135–145)

## 2021-10-05 LAB — GLUCOSE, CAPILLARY
Glucose-Capillary: 127 mg/dL — ABNORMAL HIGH (ref 70–99)
Glucose-Capillary: 110 mg/dL — ABNORMAL HIGH (ref 70–99)
Glucose-Capillary: 151 mg/dL — ABNORMAL HIGH (ref 70–99)
Glucose-Capillary: 149 mg/dL — ABNORMAL HIGH (ref 70–99)

## 2021-10-05 LAB — URINE CULTURE: Culture: <10000 — AB

## 2021-10-05 MED ORDER — ACETAMINOPHEN 650 MG RE SUPP
650.0000 mg | Freq: Four times a day (QID) | RECTAL | Status: DC
  Filled 2021-10-05: qty 1

## 2021-10-05 MED ORDER — CHLORHEXIDINE GLUCONATE CLOTH 2 % EX PADS
6.0000 | MEDICATED_PAD | Freq: Every day | CUTANEOUS | Status: DC
  Administered 2021-10-06 – 2021-10-11 (×5): 6 via TOPICAL

## 2021-10-05 MED ORDER — TAMSULOSIN HCL 0.4 MG PO CAPS
0.4000 mg | ORAL_CAPSULE | Freq: Every day | ORAL | Status: DC
  Administered 2021-10-05 – 2021-10-11 (×7): 0.4 mg via ORAL
  Filled 2021-10-05 (×7): qty 1

## 2021-10-05 MED ORDER — ACETAMINOPHEN 500 MG PO TABS
1000.0000 mg | ORAL_TABLET | Freq: Four times a day (QID) | ORAL | Status: DC
  Administered 2021-10-05 – 2021-10-11 (×19): 1000 mg via ORAL
  Filled 2021-10-05 (×21): qty 2

## 2021-10-05 MED ORDER — TIZANIDINE HCL 4 MG PO TABS: 2.0000 mg | ORAL_TABLET | Freq: Three times a day (TID) | ORAL | Status: DC | PRN

## 2021-10-05 MED FILL — Sodium Chloride IV Soln 0.9%: INTRAVENOUS | Qty: 1000 | Status: AC

## 2021-10-05 MED FILL — Heparin Sodium (Porcine) Inj 1000 Unit/ML: INTRAMUSCULAR | Qty: 30 | Status: AC

## 2021-10-05 NOTE — Progress Notes (Signed)
Inpatient Rehab Admissions Coordinator:  ?Informed of insurance denial after peer to peer completed. Pt/family made aware. Will f/u with family regarding if they have made a decision regarding wanting to pursue an appeal denial. Will continue to follow. ? ?Gayland Curry, MS, CCC-SLP ?Admissions Coordinator ?[phone] ? ?

## 2021-10-05 NOTE — H&P (Incomplete)
? ? ?Physical Medicine and Rehabilitation Admission H&P ? ?  ?CC: Functional deficits due to cervical myelopathy ? ? ?HPI: Mckenzie Lawrence is a 76 year old female with history of COPD, CVA, T2DM-insulin dependent with peripheral neuropathy, HTN, OSA, prior back surgery with recurrent lower back pain radiating to LLE>RLE and neurogenic claudication due to lumbar spondylolisthesis with stenosis L3-4 and L4-5 compressing L4, L5 and S1 nerve roots. She elected to undergo lumbar decompression L4-S1 with microdiskectomy L3-4 and fusion from L4-S1 by Dr. Arnoldo Morale on 10/01/21. Post had acute on chronic renal failure therefore Demadex discontinued and nephrology consulted for input. She was found to have evidence of UTI which was treated with 3 day course of antibiotics as well as urinary retention requiring foley placement. Case discussed with urology who recommended foley for a month for SCI and to follow up on outpatient basis.  ? ?Dr. Augustin Coupe recommended d/c of Losartan as well as nephrotoxic meds, addition of flomax and strict I/O.  ? ? ?ROS ? ? ?Past Medical History:  ?Diagnosis Date  ? Arthritis   ? Bladder incontinence   ? Broken foot   ? Cataracts, bilateral   ? Chronic kidney insufficiency   ? COPD (chronic obstructive pulmonary disease) ([place])   ? wheezing  ? CVA (cerebral vascular accident) ([place]) 2016  ? Diabetes mellitus   ? insulin dependent, Type 2  ? HLD (hyperlipidemia)   ? Hx of cardiovascular stress test   ? a. ETT (6/13):  Ex 5:13; no ischemic changes  ? Hypertension   ? controlled on meds  ? Lacunar stroke of left subthalamic region [facility]) 02/2015  ? Leg pain   ? left  ? Lower back pain   ? Neuromuscular disorder ([place])   ? stroke right hand tingling  ? Orthostatic hypotension   ? Osteopenia 01/2017  ? T score -2.0 stable from prior DEXA  ? PCOS (polycystic ovarian syndrome)   ? Personal history of tobacco use, presenting hazards to health 01/09/2015  ? Sleep apnea   ? borderline/ CPAP  ? ?Past Surgical History:   ?Procedure Laterality Date  ? BROW LIFT Bilateral 11/04/2017  ? Procedure: BLEPHAROPLASTY UPPER EYELID W/EXCESS SKIN;  Surgeon: Karle Starch, MD;  Location: [facility];  Service: Ophthalmology;  Laterality: Bilateral;  DIABETES-insulin dependent ?uses CPAP  ? CARDIAC CATHETERIZATION  20 yrs ago  ? found nothing  ? CARPAL TUNNEL RELEASE Bilateral   ? CATARACT EXTRACTION Bilateral   ? ELBOW SURGERY Bilateral   ? ESOPHAGOGASTRODUODENOSCOPY N/A 07/25/2021  ? Procedure: ESOPHAGOGASTRODUODENOSCOPY (EGD);  Surgeon: Toledo, Benay Pike, MD;  Location: [facility] ENDOSCOPY;  Service: Gastroenterology;  Laterality: N/A;  IDDM  ? FOOT SURGERY    ? Groin Abscess    ? HAND SURGERY    ? KNEE SURGERY Bilateral   ? LABIAL ABSCESS    ? LUMBAR LAMINECTOMY/DECOMPRESSION MICRODISCECTOMY Left 05/11/2018  ? Procedure: LUMBAR LAMINECTOMY/DECOMPRESSION MICRODISCECTOMY 1 LEVEL- L4-5;  Surgeon: [name], MD;  Location: [facility] ORS;  Service: Neurosurgery;  Laterality: Left;  ? OOPHORECTOMY    ? BSO  ? PUBO VAG SLING    ? SHOULDER SURGERY    ? bilateral arthroscopies  ? VAGINAL HYSTERECTOMY  1979  ? ?Family History  ?Problem Relation Age of Onset  ? Hypertension Mother   ? Heart disease Mother 55  ?     MI  ? Diabetes Father   ? Diabetes Sister   ? Hypertension Sister   ? Diabetes Brother   ? Hypertension  Brother   ? Heart disease Brother 76  ?     CAD  ? Cancer Sister   ?     Multiple myloma  ? Diabetes Brother   ? ?Social History:  reports that she has been smoking cigarettes. She has a 75.00 pack-year smoking history. She has never used smokeless tobacco. She reports that she does not drink alcohol and does not use drugs. ?Allergies:  ?Allergies  ?Allergen Reactions  ? Iodine Anaphylaxis and Other (See Comments)  ?  Pt states that she is allergic to ingested iodine only, okay for betadine.    ? Shellfish Allergy Anaphylaxis  ? Codeine Nausea And Vomiting  ? Morphine Sulfate Nausea And Vomiting  ? Irbesartan Other (See Comments)  ?   Reaction:  Unknown  (Avapro)  ? Sulfa Antibiotics Other (See Comments)  ?  Fever   ? ?Medications Prior to Admission  ?Medication Sig Dispense Refill  ? calcitRIOL (ROCALTROL) 0.25 MCG capsule Take 0.25 mcg by mouth daily.    ? hydrALAZINE (APRESOLINE) 25 MG tablet Take 25 mg by mouth 3 (three) times daily.    ? insulin detemir (LEVEMIR) 100 UNIT/ML injection Inject 30-60 Units into the skin daily.    ? losartan (COZAAR) 100 MG tablet Take 1 tablet (100 mg total) by mouth daily. 30 tablet 2  ? metoprolol succinate (TOPROL-XL) 100 MG 24 hr tablet Take 100 mg by mouth daily.    ? mirtazapine (REMERON) 7.5 MG tablet Take 7.5 mg by mouth at bedtime.    ? Multiple Vitamins-Minerals (VISION VITAMINS PO) Take 1 capsule by mouth daily. AREDS    ? OVER THE COUNTER MEDICATION Take 2 tablets by mouth daily. Cranberry complex    ? oxybutynin (DITROPAN) 5 MG tablet Take 5 mg by mouth at bedtime.    ? pantoprazole (PROTONIX) 40 MG tablet Take 40 mg by mouth daily.    ? rosuvastatin (CRESTOR) 5 MG tablet Take 5 mg by mouth daily.    ? torsemide (DEMADEX) 20 MG tablet Take 40 mg by mouth daily as needed (fluid).    ? zolpidem (AMBIEN) 10 MG tablet Take 10 mg by mouth at bedtime as needed.    ? acetaminophen (TYLENOL) 500 MG tablet Take 1,000 mg by mouth every 6 (six) hours as needed for moderate pain or headache.    ? albuterol (PROVENTIL) (2.5 MG/3ML) 0.083% nebulizer solution Take 3 mLs (2.5 mg total) by nebulization every 6 (six) hours as needed for wheezing or shortness of breath. 75 mL 0  ? albuterol (VENTOLIN HFA) 108 (90 Base) MCG/ACT inhaler Inhale 2 puffs into the lungs every 6 (six) hours as needed for wheezing or shortness of breath.    ? ANORO ELLIPTA 62.5-25 MCG/ACT AEPB SMARTSIG:1 Inhalation Via Inhaler Daily    ? BD INSULIN SYRINGE U/F 31G X 5/16" 1 ML MISC USE 1 SYRINGE AS DIRECTED    ? clopidogrel (PLAVIX) 75 MG tablet Take 75 mg by mouth daily.    ? diazepam (VALIUM) 10 MG tablet Take 10 mg by mouth as directed.  Take 1 tablet ( 10 mg ) prior to eye injection    ? LORazepam (ATIVAN) 2 MG tablet Take 0.5 mg by mouth every 8 (eight) hours as needed for anxiety.    ? ONETOUCH ULTRA test strip 4 (four) times daily.    ? oxyCODONE-acetaminophen (PERCOCET/ROXICET) 5-325 MG tablet Take 1 tablet by mouth every 6 (six) hours as needed for pain.    ? promethazine (PHENERGAN) 25  MG tablet Take 25 mg by mouth every 6 (six) hours as needed for nausea/vomiting.    ? ? ? ? ?Home: ?Home Living ?Family/patient expects to be discharged to:: Private residence ?Living Arrangements: Spouse/significant other ?Available Help at Discharge: Family, Available 24 hours/day ?Type of Home: Other(Comment) (condo) ?Home Access: Stairs to enter ?Entrance Stairs-Number of Steps: 4 ?Entrance Stairs-Rails: Can reach both ?Home Layout: One level ?Bathroom Shower/Tub: Walk-in shower ?Bathroom Toilet: Handicapped height ?Bathroom Accessibility: Yes ?Home Equipment: BSC/3in1, Shower seat - built in, [employer] - toilet, Grab bars - tub/shower, Rollator (4 wheels), [employer] (2 wheels) ?  ?Functional History: ?Prior Function ?Prior Level of Function : Independent/Modified Independent ?Mobility Comments: no AD ?ADLs Comments: reports ind ADLs, spouse completes IADLS ? ?Functional Status:  ?Mobility: ?Bed Mobility ?Overal bed mobility: Needs Assistance ?Bed Mobility: Rolling, Sit to Sidelying ?Rolling: Min assist ?Sidelying to sit: Min assist ?Sit to sidelying: Min assist ?General bed mobility comments: sitting EOB at beginning of session and left in chair ?Transfers ?Overall transfer level: Needs assistance ?Equipment used: Rolling walker (2 wheels) ?Transfers: Sit to/from Stand ?Sit to Stand: Min guard, Supervision ?General transfer comment: cues for safe hand placement, rocking for momentum, and increased anterior trunk lean ?Ambulation/Gait ?Ambulation/Gait assistance: Min guard ?Gait Distance (Feet): 50 Feet (50' x 2 trials) ?Assistive device: Rolling  walker (2 wheels) ?Gait Pattern/deviations: Step-through pattern ?General Gait Details: pt with slowed step-through gait, mild L knee instability near end of 1st trial, none noted with 2nd trial ?Gait velocity: r

## 2021-10-05 NOTE — Progress Notes (Signed)
Pt transferred to [place] room 06. Left unit on bed pushed by this RN and Candice, Charge RN accompanied by family. Arrived on unit in stable condition.  ? ?

## 2021-10-05 NOTE — Progress Notes (Signed)
Report called and given to [place], RN on [place]. Pt to be transferred momentarily.  ?VWilliams,RN. ?

## 2021-10-05 NOTE — Progress Notes (Signed)
? ?  Providing Compassionate, Quality Care - Together ? ? ?Subjective: ?Patient's daughter and husband are at the bedside. Patient's daughter expresses significant frustration with the CIR denial. They feel the patient is not being mobilized enough. I gave them the information regarding appealing the CIR decision and advised them to contact Ms. Gordan insurance carrier. Patient lethargic since she was given oxycodone and Flexeril. Patient's daughter feels the Flexeril is what is making Mckenzie Lawrence altered. ? ?Objective: ?Vital signs in last 24 hours: ?Temp:  [98.9 ?F (37.2 ?C)-100 ?F (37.8 ?C)] 99 ?F (37.2 ?C) (04/21 3875) ?Pulse Rate:  [83-88] 88 (04/21 1005) ?Resp:  [18-19] 18 (04/21 6433) ?BP: (141-162)/(42-72) 159/58 (04/21 1005) ?SpO2:  [96 %-98 %] 96 % (04/21 0921) ? ?Intake/Output from previous day: ?04/20 0701 - 04/21 0700 ?In: 2737 [P.O.:1437; I.V.:1200; IV Piggyback:100] ?Out: 1390 [Urine:1390] ?Intake/Output this shift: ?No intake/output data recorded. ? ?Responds to voice, drowsy, but oriented x 4 ?PERRLA ?CN II-XII grossly intact ?MAE, Generalized weakness, sensation intact ?Incision is covered with Honeycomb dressing and Steri Strips; Dressing with old sanguinous drainage ? ? ?Lab Results: ?No results for input(s): WBC, HGB, HCT, PLT in the last 72 hours. ?BMET ?Recent Labs  ?  10/04/21 ?2951 10/05/21 ?0403  ?NA 134* 132*  ?K 4.1 4.0  ?CL 106 108  ?CO2 20* 18*  ?GLUCOSE 122* 111*  ?BUN 25* 24*  ?CREATININE 2.28* 2.10*  ?CALCIUM 7.8* 7.5*  ? ? ?Studies/Results: ?No results found. ? ?Assessment/Plan: ?Patient underwent L4-5 and L5-S1 PLIF by Dr. Arnoldo Morale on 10/01/2021. Her post operative course has been complicated by AKI on CKD4. Nephrology is following the patient. Patient recommended for CIR, but insurance has denies. Family reports they will begin the appeal process. ? ? LOS: 4 days  ? ?-Discontinued the cyclobenzaprine. Will trial tizanidine 2 mg. ?-Scheduled acetaminophen 1000 mg q4 hours ?-Will  transfer patient to 4NP if a bed is available. 3W would also be acceptable. ? ? ?Viona Gilmore, DNP, AGNP-C ?Nurse Practitioner ? ?[facility] ?[address], [place], [place] [address] ?P: [phone]    F: [phone] ? ?10/05/2021, 1:02 PM ? ? ? ? ?

## 2021-10-05 NOTE — Progress Notes (Signed)
Physical Therapy Treatment ?Patient Details ?Name: Mckenzie Lawrence ?MRN: [id number] ?DOB: 06/17/1946 ?Today's Date: 10/05/2021 ? ? ?History of Present Illness Pt is a 76 y/o female who presents s/p L4-S1 PLIF on 10/01/2021. PMH signficant for cataracts, COPD, CVA, DM, arthritis, HTN, orthostatic hypotension, prior back surgery, bil shoulder surgery. ? ?  ?PT Comments  ? ? Patient continues to make incremental progress with therapy. Patient ambulated 63' + 12' with RW and minA + chair follow as patient demos difficulty self monitoring activity tolerance, however fear of falling plays a factor in this also. Patient requires encouragement to progress. Educated family on allowing nursing staff to assist with ambulation to continue progressing, family verbalized understanding. She would still benefit from AIR to maximize functional independence as this is not patient's baseline.  ?   ?Recommendations for follow up therapy are one component of a multi-disciplinary discharge planning process, led by the attending physician.  Recommendations may be updated based on patient status, additional functional criteria and insurance authorization. ? ?Follow Up Recommendations ? Acute inpatient rehab (3hours/day) ?  ?  ?Assistance Recommended at Discharge Frequent or constant Supervision/Assistance  ?Patient can return home with the following A little help with walking and/or transfers;Assistance with cooking/housework;Assist for transportation;Help with stairs or ramp for entrance ?  ?Equipment Recommendations ? None recommended by PT  ?  ?Recommendations for Other Services   ? ? ?  ?Precautions / Restrictions Precautions ?Precautions: Fall;Back ?Precaution Booklet Issued: Yes (comment) ?Precaution Comments: able to recall 3/3 precautions this session ?Required Braces or Orthoses: Spinal Brace ?Spinal Brace: Lumbar corset;Applied in sitting position ?Restrictions ?Weight Bearing Restrictions: No  ?  ? ?Mobility ? Bed Mobility ?Overal  bed mobility: Needs Assistance ?Bed Mobility: Rolling, Sidelying to Sit ?Rolling: Min guard ?Sidelying to sit: Min assist ?  ?  ?  ?General bed mobility comments: minA for trunk elevation. Cues for log roll technique ?  ? ?Transfers ?Overall transfer level: Needs assistance ?Equipment used: Rolling Stephen Baruch (2 wheels) ?Transfers: Sit to/from Stand ?Sit to Stand: Mod assist, +2 safety/equipment ?  ?  ?  ?  ?  ?General transfer comment: cues for hand placement as patient continues to attempt to pul from RW. ModA to stand from low bed surface and recliner x2. Initial posterior lean in standing from bed surface ?  ? ?Ambulation/Gait ?Ambulation/Gait assistance: Min assist, +2 safety/equipment ?Gait Distance (Feet): 20 Feet (+12') ?Assistive device: Rolling Morrell Fluke (2 wheels) ?Gait Pattern/deviations: Step-through pattern, Decreased stride length, Trunk flexed ?Gait velocity: decreased ?  ?  ?General Gait Details: minA + chair follow for balance and activity tolerance. Taking multiple standing breaks but seems to be due to fear of falling. Cues for upright posture due to patient staying flexed posture. ? ? ?Stairs ?  ?  ?  ?  ?  ? ? ?Wheelchair Mobility ?  ? ?Modified Rankin (Stroke Patients Only) ?  ? ? ?  ?Balance Overall balance assessment: Needs assistance ?Sitting-balance support: No upper extremity supported, Feet supported ?Sitting balance-Leahy Scale: Fair ?  ?  ?Standing balance support: Bilateral upper extremity supported, During functional activity ?Standing balance-Leahy Scale: Poor ?Standing balance comment: relies on UE support ?  ?  ?  ?  ?  ?  ?  ?  ?  ?  ?  ?  ? ?  ?Cognition Arousal/Alertness: Awake/alert ?Behavior During Therapy: [facility] for tasks assessed/performed, Agitated ?Overall Cognitive Status: Impaired/Different from baseline ?Area of Impairment: Memory, Problem solving, Safety/judgement ?  ?  ?  ?  ?  ?  ?  ?  ?  ?  ?  Memory: Decreased short-term memory, Decreased recall of precautions ?   ?Safety/Judgement: Decreased awareness of safety ?  ?Problem Solving: Slow processing, Difficulty sequencing, Requires verbal cues ?General Comments: awake at beginning of session but by end of session, patient falling asleep during conversation. Improved mood otherwise. Still not at baseline cognitively ?  ?  ? ?  ?Exercises   ? ?  ?General Comments   ?  ?  ? ?Pertinent Vitals/Pain Pain Assessment ?Pain Assessment: 0-10 ?Pain Score: 5  ?Pain Location: back ?Pain Descriptors / Indicators: Discomfort, Operative site guarding ?Pain Intervention(s): Monitored during session, Repositioned  ? ? ?Home Living   ?  ?  ?  ?  ?  ?  ?  ?  ?  ?   ?  ?Prior Function    ?  ?  ?   ? ?PT Goals (current goals can now be found in the care plan section) Acute Rehab PT Goals ?Patient Stated Goal: None stated ?PT Goal Formulation: With patient/family ?Time For Goal Achievement: 10/16/21 ?Potential to Achieve Goals: Good ?Progress towards PT goals: Progressing toward goals ? ?  ?Frequency ? ? ? Min 5X/week ? ? ? ?  ?PT Plan Current plan remains appropriate  ? ? ?Co-evaluation   ?  ?  ?  ?  ? ?  ?AM-PAC PT "6 Clicks" Mobility   ?Outcome Measure ? Help needed turning from your back to your side while in a flat bed without using bedrails?: A Little ?Help needed moving from lying on your back to sitting on the side of a flat bed without using bedrails?: A Little ?Help needed moving to and from a bed to a chair (including a wheelchair)?: A Little ?Help needed standing up from a chair using your arms (e.g., wheelchair or bedside chair)?: A Little ?Help needed to walk in hospital room?: Total ?Help needed climbing 3-5 steps with a railing? : Total ?6 Click Score: 14 ? ?  ?End of Session Equipment Utilized During Treatment: Gait belt;Back brace ?Activity Tolerance: Patient tolerated treatment well ?Patient left: in chair;with call bell/phone within reach;with chair alarm set;with family/visitor present ?Nurse Communication: Mobility status ?PT  Visit Diagnosis: Unsteadiness on feet (R26.81);Pain ?  ? ? ?Time: 7048-8891 ?PT Time Calculation (min) (ACUTE ONLY): 42 min ? ?Charges:  $Gait Training: 23-37 mins ?$Therapeutic Activity: 8-22 mins          ?          ? ?Lestine Rahe A. Gilford Rile, PT, DPT ?Acute Rehabilitation Services ?Pager [phone] ?Office [phone] ? ? ? ?Mckenzie Lawrence A Claudia Greenley ?10/05/2021, 4:35 PM ? ?

## 2021-10-05 NOTE — Progress Notes (Signed)
Pt transferred to 4NP06 from 43M. Pt alert and orientedx3, denies numbness/tingling, vital signs WNL and charted. Full assessment charted at this time. [name] husband, daughter, and son-in-law at the bedside. Honeycomb dressing also changed at this time, per Arnetha Massy, NP.  ? ?Justice Rocher, RN ? ?

## 2021-10-05 NOTE — TOC Progression Note (Signed)
Transition of Care (TOC) - Initial/Assessment Note  ? ? ?Patient Details  ?Name: Mckenzie Lawrence ?MRN: [id number] ?Date of Birth: 1945-09-16 ? ?Transition of Care (TOC) CM/SW Contact:    ?Paulene Floor Janay Canan, LCSWA ?Phone Number: ?10/05/2021, 10:40 AM ? ?Clinical Narrative:                ? ?TOC following patient for any d/c planning needs once medically stable. ? ?PT is recommending inpatient rehab.  CIR is awaiting a Biochemist, clinical.   ? ?Lind Covert, MSW, LCSWA  ? ?Patient Goals and CMS Choice ?  ?  ?  ? ?Expected Discharge Plan and Services ?  ?  ?  ?  ?  ?                ?  ?  ?  ?  ?  ?  ?  ?  ?  ?  ? ?Prior Living Arrangements/Services ?  ?  ?  ?       ?  ?  ?  ?  ? ?Activities of Daily Living ?Home Assistive Devices/Equipment: Eyeglasses, CBG Meter, Blood pressure cuff, Grab bars in shower, Shower chair without back ?ADL Screening (condition at time of admission) ?Patient's cognitive ability adequate to safely complete daily activities?: Yes ?Is the patient deaf or have difficulty hearing?: No ?Does the patient have difficulty seeing, even when wearing glasses/contacts?: No ?Does the patient have difficulty concentrating, remembering, or making decisions?: No ?Patient able to express need for assistance with ADLs?: Yes ?Does the patient have difficulty dressing or bathing?: Yes ?Independently performs ADLs?: Yes (appropriate for developmental age) ?Does the patient have difficulty walking or climbing stairs?: Yes ?Weakness of Legs: Both ?Weakness of Arms/Hands: None ? ?Permission Sought/Granted ?  ?  ?   ?   ?   ?   ? ?Emotional Assessment ?  ?  ?  ?  ?  ?  ? ?Admission diagnosis:  Lumbar spinal stenosis [M48.061] ?Patient Active Problem List  ? Diagnosis Date Noted  ? Lumbar spinal stenosis 10/01/2021  ? Situational anxiety 07/08/2019  ? Chronic kidney disease, stage 3 unspecified ([place]) 03/01/2019  ? Edema of lower extremity 03/01/2019  ? Proteinuria 03/01/2019  ? Encounter for orthopedic follow-up care  11/04/2018  ? CAD (coronary artery disease) 10/26/2018  ? Pain in right hand 07/22/2018  ? Degeneration of lumbar intervertebral disc 02/06/2018  ? Scoliosis deformity of spine 02/06/2018  ? Spondylolisthesis, grade 1 02/06/2018  ? Low back pain 02/04/2018  ? Spinal stenosis of lumbar region 02/04/2018  ? Trigger finger 01/26/2018  ? CVA (cerebral vascular accident) ([place]) 03/25/2016  ? OSA (obstructive sleep apnea) 02/15/2016  ? Hyponatremia 09/25/2015  ? Vitamin B12 deficiency 09/12/2015  ? Lacunar stroke of left subthalamic region [facility]) 09/12/2015  ? Diabetic polyneuropathy associated with diabetes mellitus due to underlying condition ([place]) 09/12/2015  ? Stroke ([place]) 03/15/2015  ? Accelerated hypertension 03/15/2015  ? COPD (chronic obstructive pulmonary disease) ([place]) 09/24/2013  ? Vertigo 09/24/2013  ? Female stress incontinence 02/10/2013  ? Incomplete emptying of bladder 02/10/2013  ? Increased frequency of urination 02/10/2013  ? Urge incontinence 02/10/2013  ? Syncope 12/04/2011  ? Tobacco abuse 12/04/2011  ? Elevated cholesterol   ? Cataracts, bilateral   ? Hypertension   ? Broken foot   ? PCOS (polycystic ovarian syndrome)   ? Type II diabetes mellitus ([place])   ? Osteopenia   ? ?PCP:  Maryland Pink, MD ?Pharmacy:   ?[facility] -  [place] ?[address] ?[place] [address] ?Phone: [phone] Fax: [phone] ? ?[place] ?[place] [address] ?Phone: [phone] Fax: [phone] ? ? ? ? ?Social Determinants of Health (SDOH) Interventions ?  ? ?Readmission Risk Interventions ?   ? View : No data to display.  ?  ?  ?  ? ? ? ?

## 2021-10-05 NOTE — Consult Note (Addendum)
?[facility] ?Progress Note  ? ? ?Assessment/ Plan:   ? ?AKI on CKD4  Obstructive uropathy ?Likely multifactorial in the setting of recent spine surgery 4 days ago and not eating or drinking much since surgery, UTI, and obstruction. Cr has been improving with fluids. CTX started yesterday for UTI. Patient had urinary retention yesterday, catheter was placed. Total of 1.347 L UOP (578m from cath) ?Cr 2.10<2.28<2.39<2.07<1.41. GFR 24. Baseline Cr ~ 2  ?UA with 50 glucose, small hgb, 100 protein, mod leuks. Urine culture with <10.000 colonies  ?Urine Na 41, P/Cr 1.39 indicating significant proteinuria  ?Started on CTX for a 3 day course ?- hold Losartan for now given acute kidney injury  ?- avoid nephrotoxic agents   ?- strict Is and Os ?- monitor post void residual. If greater than 300, place foley. She has some incontinence and would prefer not to instrument. Certainly possible she had functional obstruction from anesthesia but that should be improved by now.  ?- IVF increased 125 IVF; no signs of overload and actually appears hypovolemic.  ?- continue CTX ?- will start Flomax  ?- per urology, patient will need foley for a month given her spine surgery and is treated as a spinal injury. Advised patient to follow up with urology outpatient ?  ?Lumbar radiculopathy, spondylolisthesis, spinal stenosis, DDD  ?Follows with neurosurgery  ?S/p L3-4 and L4-5 decompression, instrumentation fusion and L3-4 discectomy. ?  ?HTN ?BP over last 24 hours have ranged from 131-158/36-53 ?On Losartan '100mg'$  daily at home (on hold in hosp w/ aki), Metoprolol succinate 100 mg daily  ?- holding Losartan due to AKI  ?  ?DM2 ?A1c 6.2  ?On SSI, managed per primary team  ? ?Subjective:   ? ?Patient states she just feels very thirsty. She says she is ready to go home and is frustrated with the nursing staff. Denies any urinary complaints.   ? ?Objective:   ?BP (!) 152/55 (BP Location: Right Arm)   Pulse 86   Temp 99.4 ?F  (37.4 ?C) (Oral)   Resp 19   Ht '5\' 6"'$  (1.676 m)   Wt 90.7 kg   SpO2 97%   BMI 32.27 kg/m?  ? ?Intake/Output Summary (Last 24 hours) at 10/05/2021 0814 ?Last data filed at 10/05/2021 0530 ?Gross per 24 hour  ?Intake 2737 ml  ?Output 1390 ml  ?Net 1347 ml  ? ?Weight change:  ? ?Physical Exam: ? ?General: alert, laying in bed, NAD ?CV: RRR no murmurs ?Resp: CTAB normal WOB  ?GI: soft, non distended.  ?Extremities: warm, dry. Moving spontaneously and equally  ? ?Imaging: ?No results found. ? ?Labs: ?BMET ?Recent Labs  ?Lab 10/01/21 ?0701 10/03/21 ?0397604/20/23 ?0734104/21/23 ?0403  ?NA 134* 132* 134* 132*  ?K 4.4 4.3 4.1 4.0  ?CL 104 105 106 108  ?CO2 22 21* 20* 18*  ?GLUCOSE 117* 137* 122* 111*  ?BUN 17 25* 25* 24*  ?CREATININE 2.07* 2.39* 2.28* 2.10*  ?CALCIUM 9.0 8.0* 7.8* 7.5*  ? ?CBC ?No results for input(s): WBC, NEUTROABS, HGB, HCT, MCV, PLT in the last 168 hours. ? ?Medications:   ? ? calcitRIOL  0.25 mcg Oral Daily  ? docusate sodium  100 mg Oral BID  ? hydrALAZINE  25 mg Oral TID  ? insulin aspart  0-15 Units Subcutaneous TID WC  ? insulin aspart  0-5 Units Subcutaneous QHS  ? metoprolol succinate  100 mg Oral Daily  ? mirtazapine  7.5 mg Oral QHS  ? multivitamin  1  tablet Oral Daily  ? oxybutynin  5 mg Oral QHS  ? pantoprazole  40 mg Oral Daily  ? rosuvastatin  5 mg Oral Daily  ? sodium chloride flush  3 mL Intravenous Q12H  ? ? ? ? ?Otelia Santee, MD ?10/05/2021, 8:14 AM   ?

## 2021-10-05 NOTE — Progress Notes (Signed)
This chaplain responded to unit page for a priest visit. The chaplain checked in with the Pt. and family before calling Father Barnabas Lister.  ? ?The chaplain understands the Pt. is requesting prayer and communion from a Catholic priest. The chaplain understands the Pt. doesn't have a parish in [place]. The chaplain left a voice mail for Father Barnabas Lister. The chaplain will update the on-call chaplain of the Pt. request.  ? ?Chaplain Sallyanne Kuster ?

## 2021-10-05 NOTE — Progress Notes (Signed)
? ?  Providing Compassionate, Quality Care - Together ? ? ? ?Spoke with Dr. Amalia Hailey at [facility] regarding Ms. Balbach. It is felt Ms. Carrithers rehabilitation and medical management would be better suited for a skilled nursing facility. The denial for CIR remains. The patient and her family will be given the opportunity to appeal if they wish per Dr. Amalia Hailey. ? ? ? ? ?Viona Gilmore, DNP, AGNP-C ?Nurse Practitioner ?10/05/2021 12:22 PM ? ? ?[facility] ?[address], [address], [place],  [address] ?P: [phone]    F: [phone] ?

## 2021-10-06 LAB — CBC
HCT: 21.4 % — ABNORMAL LOW (ref 36.0–46.0)
Hemoglobin: 7 g/dL — ABNORMAL LOW (ref 12.0–15.0)
MCH: 31.5 pg (ref 26.0–34.0)
MCHC: 32.7 g/dL (ref 30.0–36.0)
MCV: 96.4 fL (ref 80.0–100.0)
Platelets: 247 10*3/uL (ref 150–400)
RBC: 2.22 MIL/uL — ABNORMAL LOW (ref 3.87–5.11)
RDW: 13.7 % (ref 11.5–15.5)
WBC: 11.7 10*3/uL — ABNORMAL HIGH (ref 4.0–10.5)
nRBC: 0 % (ref 0.0–0.2)

## 2021-10-06 LAB — BASIC METABOLIC PANEL
Anion gap: 9 (ref 5–15)
BUN: 20 mg/dL (ref 8–23)
CO2: 17 mmol/L — ABNORMAL LOW (ref 22–32)
Calcium: 7.9 mg/dL — ABNORMAL LOW (ref 8.9–10.3)
Chloride: 105 mmol/L (ref 98–111)
Creatinine, Ser: 1.94 mg/dL — ABNORMAL HIGH (ref 0.44–1.00)
GFR, Estimated: 27 mL/min — ABNORMAL LOW (ref >60)
Glucose, Bld: 178 mg/dL — ABNORMAL HIGH (ref 70–99)
Potassium: 4.2 mmol/L (ref 3.5–5.1)
Sodium: 131 mmol/L — ABNORMAL LOW (ref 135–145)

## 2021-10-06 LAB — GLUCOSE, CAPILLARY
Glucose-Capillary: 161 mg/dL — ABNORMAL HIGH (ref 70–99)
Glucose-Capillary: 165 mg/dL — ABNORMAL HIGH (ref 70–99)
Glucose-Capillary: 167 mg/dL — ABNORMAL HIGH (ref 70–99)
Glucose-Capillary: 136 mg/dL — ABNORMAL HIGH (ref 70–99)

## 2021-10-06 MED ORDER — NICOTINE 14 MG/24HR TD PT24
14.0000 mg | MEDICATED_PATCH | Freq: Every day | TRANSDERMAL | Status: DC
  Administered 2021-10-06 – 2021-10-08 (×3): 14 mg via TRANSDERMAL
  Filled 2021-10-06 (×5): qty 1

## 2021-10-06 MED ORDER — HALOPERIDOL LACTATE 5 MG/ML IJ SOLN: 2.0000 mg | Freq: Every evening | INTRAMUSCULAR | Status: DC | PRN

## 2021-10-06 MED ORDER — HYDRALAZINE HCL 20 MG/ML IJ SOLN
10.0000 mg | Freq: Four times a day (QID) | INTRAMUSCULAR | Status: DC | PRN
  Administered 2021-10-09: 10 mg via INTRAVENOUS
  Filled 2021-10-06: qty 1

## 2021-10-06 NOTE — Progress Notes (Addendum)
When asked if pt ready for CPAP, pt refused at this time. Pt. Stated "I am not going to sleep like this tonight". Pt. States "there are mice crawling up and down the poles, through that hole where the wallpaper is lifted. 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  ?

## 2021-10-06 NOTE — Progress Notes (Signed)
RN paged  neurosurgery on call concerning pt. BP 176/64 (96) with no PRN to administer. Mckenzie Lawrence discussed with RN that she would put in orders for Hydralazine PRN.  ? ? ?

## 2021-10-06 NOTE — Progress Notes (Signed)
?[facility] ?Progress Note  ? ?76 y.o. female p/w back, L 1st toe, and R leg pain due to lumbar radiculopathy. Lumbar MRI and X rays showed spondylolisthesis, spinal stenosis, herniated disc and given she failed medical management -> S/p L3-4 and L4-5 decompression, instrumentation fusion and L3-4 discectomy but developed AKI on CKD4. ? ?Assessment/ Plan:   ?AKI on CKD3  Obstructive uropathy - unclear what BL renal function is as there appears to have been progression with cr incr from 1-1.2 -> 1.4 from 2013 -> 2019 before this hopitalization. UPC 1.39 likely from diabetic nephropathy. ?Likely multifactorial in the setting of recent spine surgery 4 days ago and not eating or drinking much since surgery, UTI, and obstruction. Cr had been improving with fluids.  ? ?CTX started for UTI (started 4/20 D1). Patient had urinary retention -> catheter placement. ?Cr 2.10<2.28<2.39<2.07<1.41. GFR 24. Baseline Cr ~ 2? ? ?UA with 50 glucose, small hgb, 100 protein, mod leuks. Urine culture with <10.000 colonies  -> CTX ? ?- holding Losartan for now given acute kidney injury  ?- avoid nephrotoxic agents   ?- strict Is and Os ? ?- started on tamsulosin ? ?- per urology, patient will need foley for a month given her spine surgery and is treated as a spinal injury. Will need f/u with urology outpatient in 1 mth to evaluate the functional outflow obstruction and possibly challenge by removing the catheter. ?  ?Lumbar radiculopathy, spondylolisthesis, spinal stenosis, DDD  ?Follows with neurosurgery  ?S/p L3-4 and L4-5 decompression, instrumentation fusion and L3-4 discectomy. ?  ?HTN ?BP over last 24 hours have ranged from 150-170's/60's ?On Losartan '100mg'$  daily at home (on hold in hosp w/ aki), Metoprolol succinate 100 mg daily  ?- holding Losartan due to AKI  ?  ?DM2 ?A1c 6.2  ?On SSI, managed per primary team  ? ?Subjective:   ?Pleasant but confused this AM, thinks she saw mice on the walls with  droppings. ?Denied f/cn/v/shortness of breath. Doesn't want to leave with the foley.  ? ?Objective:   ?BP (!) 159/62 (BP Location: Right Arm)   Pulse 73   Temp 97.7 ?F (36.5 ?C) (Oral)   Resp 15   Ht '5\' 6"'$  (1.676 m)   Wt 90.7 kg   SpO2 97%   BMI 32.27 kg/m?  ? ?Intake/Output Summary (Last 24 hours) at 10/06/2021 4650 ?Last data filed at 10/06/2021 3546 ?Gross per 24 hour  ?Intake 1330.88 ml  ?Output 500 ml  ?Net 830.88 ml  ? ?Weight change:  ? ?Physical Exam: ?GEN: NAD, NCAT ?HEENT: No conjunctival pallor, EOMI ?NECK: Supple, no thyromegaly ?LUNGS: CTA B/L no rales, rhonchi or wheezing ?CV: RRR, No M/R/G ?ABD: SNDNT +BS  ?EXT: No lower extremity edema ?GU: Foley to gravity ? ? ?Imaging: ?No results found. ? ?Labs: ?BMET ?Recent Labs  ?Lab 10/01/21 ?0701 10/03/21 ?5681 10/04/21 ?2751 10/05/21 ?0403  ?NA 134* 132* 134* 132*  ?K 4.4 4.3 4.1 4.0  ?CL 104 105 106 108  ?CO2 22 21* 20* 18*  ?GLUCOSE 117* 137* 122* 111*  ?BUN 17 25* 25* 24*  ?CREATININE 2.07* 2.39* 2.28* 2.10*  ?CALCIUM 9.0 8.0* 7.8* 7.5*  ? ?CBC ?No results for input(s): WBC, NEUTROABS, HGB, HCT, MCV, PLT in the last 168 hours. ? ?Medications:   ? ? acetaminophen  1,000 mg Oral Q6H  ? Or  ? acetaminophen  650 mg Rectal Q6H  ? calcitRIOL  0.25 mcg Oral Daily  ? Chlorhexidine Gluconate Cloth  6 each Topical  Daily  ? docusate sodium  100 mg Oral BID  ? hydrALAZINE  25 mg Oral TID  ? insulin aspart  0-15 Units Subcutaneous TID WC  ? insulin aspart  0-5 Units Subcutaneous QHS  ? metoprolol succinate  100 mg Oral Daily  ? mirtazapine  7.5 mg Oral QHS  ? multivitamin  1 tablet Oral Daily  ? oxybutynin  5 mg Oral QHS  ? pantoprazole  40 mg Oral Daily  ? rosuvastatin  5 mg Oral Daily  ? sodium chloride flush  3 mL Intravenous Q12H  ? tamsulosin  0.4 mg Oral Daily  ? ? ? ? ?Otelia Santee, MD ?10/06/2021, 8:22 AM  ? ?

## 2021-10-06 NOTE — Progress Notes (Signed)
Pt has FreeStyle from home to monitor blood sugars and is requesting to use that in the hospital to avoid finger sticks. Viona Gilmore, NP spoke to this RN about this. This RN spoke to diabetic coordinator, who said that per [facility] policy, pt will still have to have finger sticks with meals. Will make pt and family aware. ? ?Justice Rocher, RN ? ?

## 2021-10-06 NOTE — Progress Notes (Signed)
? ?  Providing Compassionate, Quality Care - Together ? ? ?Subjective: ?Nursing staff reports patient refused CPAP last night and also experienced hallucinations. Mckenzie Lawrence reports she feels much better today. She is up to chair and has eaten breakfast. Daughter, Sherene Sires, feels the patient is much improved. [name] requests nicotine patch for Ms. Lamphear as patient smokes 3 packs a day at home. She reports that Mrs. Diggins is exhibiting "anxious" behavior such as biting nails and fidgeting that are typically associated with smoking cessation attempts in the past. Mrs. Sane would like to take a shower if possible. Sherene Sires and Mrs. Ron are also wanting to trial Foley catheter removal prior to discharge now that the patient has started Flomax. ? ?Objective: ?Vital signs in last 24 hours: ?Temp:  [97.7 ?F (36.5 ?C)-98.3 ?F (36.8 ?C)] 97.7 ?F (36.5 ?C) (04/22 9150) ?Pulse Rate:  [72-84] 73 (04/22 0748) ?Resp:  [15-20] 15 (04/22 0748) ?BP: (146-176)/(56-66) 159/62 (04/22 0748) ?SpO2:  [95 %-100 %] 97 % (04/22 0748) ? ?Intake/Output from previous day: ?04/21 0701 - 04/22 0700 ?In: 2970 [P.O.:340; I.V.:2630] ?Out: 500 [Urine:500] ?Intake/Output this shift: ?No intake/output data recorded. ? ?Alert and oriented x 4 ?PERRLA ?CN II-XII grossly intact ?MAE, Strength and sensation appear grossly intact ?Incision is covered with Honeycomb dressing and Steri Strips; Dressing is clean, dry, and intact ? ? ?Lab Results: ?No results for input(s): WBC, HGB, HCT, PLT in the last 72 hours. ?BMET ?Recent Labs  ?  10/04/21 ?5697 10/05/21 ?0403  ?NA 134* 132*  ?K 4.1 4.0  ?CL 106 108  ?CO2 20* 18*  ?GLUCOSE 122* 111*  ?BUN 25* 24*  ?CREATININE 2.28* 2.10*  ?CALCIUM 7.8* 7.5*  ? ? ?Studies/Results: ?No results found. ? ?Assessment/Plan: ?Patient underwent L4-5 and L5-S1 PLIF by Dr. Arnoldo Morale on 10/01/2021. Her post operative course has been complicated by AKI on CKD4. Nephrology is following the patient. Patient recommended for CIR,  but insurance has denied. Family reports they will begin the appeal process. Patient with much improved mental status and pain control on 10/06/2021. ?  ? LOS: 5 days  ? ?-I would like for the patient to be on Flomax for at least one more day prior to removing the indwelling catheter. It was explained to the patient and her family that if the patient failed to void following Foley discontinuation tomorrow that it would have to be replaced and the patient would need to follow up with urology as an outpatient. ?-Placed order for patient to shower with assistance. ?-One time dose of prn Haldol 2 mg IV added for tonight if the patient needs it. Avoid administration unless patient is significantly agitated. ?-Patient has been without cigarettes for five days now. I added 14 mg nicotine patch and encouraged smoking cessation. ?-Continue to encourage mobilization. ?-Family still working towards appeal of insurance company's denial of CIR. ? ? ?Viona Gilmore, DNP, AGNP-C ?Nurse Practitioner ? ?[facility] ?[address], [address], [place], [place] [address] ?P: [phone]    F: [phone] ? ?10/06/2021, 11:06 AM ? ? ? ? ?

## 2021-10-06 NOTE — Progress Notes (Signed)
Physical Therapy Treatment ?Patient Details ?Name: Mckenzie Lawrence ?MRN: [id number] ?DOB: 1946/03/15 ?Today's Date: 10/06/2021 ? ? ?History of Present Illness Pt is a 76 y/o female who presents s/p L4-S1 PLIF on 10/01/2021. PMH signficant for cataracts, COPD, CVA, DM, arthritis, HTN, orthostatic hypotension, prior back surgery, bil shoulder surgery. ? ?  ?PT Comments  ? ? Pt tolerates treatment well although still requiring encouragement to participate in multiple bouts of ambulation and mobility. Pt demonstrates improvement in transfer quality and ambulation distances. Pt will benefit from continued aggressive mobilization in an effort to restore her prior level of function. PT continues to recommend AIR admission.   ?Recommendations for follow up therapy are one component of a multi-disciplinary discharge planning process, led by the attending physician.  Recommendations may be updated based on patient status, additional functional criteria and insurance authorization. ? ?Follow Up Recommendations ? Acute inpatient rehab (3hours/day) ?  ?  ?Assistance Recommended at Discharge Frequent or constant Supervision/Assistance  ?Patient can return home with the following A little help with walking and/or transfers;Assistance with cooking/housework;Assist for transportation;Help with stairs or ramp for entrance ?  ?Equipment Recommendations ? None recommended by PT  ?  ?Recommendations for Other Services   ? ? ?  ?Precautions / Restrictions Precautions ?Precautions: Fall;Back ?Precaution Booklet Issued: Yes (comment) ?Precaution Comments: recalls 2/3 back precautions ?Required Braces or Orthoses: Spinal Brace ?Spinal Brace: Lumbar corset;Applied in sitting position ?Restrictions ?Weight Bearing Restrictions: No  ?  ? ?Mobility ? Bed Mobility ?Overal bed mobility: Needs Assistance ?Bed Mobility: Rolling, Sidelying to Sit, Sit to Sidelying ?Rolling: Min assist ?Sidelying to sit: Min assist ?  ?  ?Sit to sidelying: Min  assist ?General bed mobility comments: minA for LE support when returning to bed ?  ? ?Transfers ?Overall transfer level: Needs assistance ?Equipment used: Rolling walker (2 wheels) ?Transfers: Sit to/from Stand ?Sit to Stand: Min assist ?  ?  ?  ?  ?  ?General transfer comment: cues for increased anterior trunk lean ?  ? ?Ambulation/Gait ?Ambulation/Gait assistance: Min assist ?Gait Distance (Feet): 30 Feet (additional trial of 25') ?Assistive device: Rolling walker (2 wheels) ?Gait Pattern/deviations: Step-through pattern ?Gait velocity: reduced ?Gait velocity interpretation: <1.8 ft/sec, indicate of risk for recurrent falls ?  ?General Gait Details: pt with slowed step-through gait, does bump into furniture twice during session but corrects ? ? ?Stairs ?  ?  ?  ?  ?  ? ? ?Wheelchair Mobility ?  ? ?Modified Rankin (Stroke Patients Only) ?  ? ? ?  ?Balance Overall balance assessment: Needs assistance ?Sitting-balance support: No upper extremity supported, Feet supported ?Sitting balance-Leahy Scale: Fair ?  ?  ?Standing balance support: Bilateral upper extremity supported, Reliant on assistive device for balance ?Standing balance-Leahy Scale: Poor ?  ?  ?  ?  ?  ?  ?  ?  ?  ?  ?  ?  ?  ? ?  ?Cognition Arousal/Alertness: Awake/alert ?Behavior During Therapy: [facility] for tasks assessed/performed, Agitated ?Overall Cognitive Status: Impaired/Different from baseline ?Area of Impairment: Problem solving, Memory ?  ?  ?  ?  ?  ?  ?  ?  ?  ?  ?Memory: Decreased recall of precautions ?  ?  ?  ?Problem Solving: Slow processing ?  ?  ?  ? ?  ?Exercises   ? ?  ?General Comments General comments (skin integrity, edema, etc.): VSS on RA ?  ?  ? ?Pertinent Vitals/Pain Pain Assessment ?Pain Assessment: Faces ?Faces Pain  Scale: Hurts little more ?Pain Location: back ?Pain Descriptors / Indicators: Sore ?Pain Intervention(s): Monitored during session  ? ? ?Home Living   ?  ?  ?  ?  ?  ?  ?  ?  ?  ?   ?  ?Prior Function    ?  ?  ?    ? ?PT Goals (current goals can now be found in the care plan section) Acute Rehab PT Goals ?Patient Stated Goal: to return to her prior level of function ?Progress towards PT goals: Progressing toward goals ? ?  ?Frequency ? ? ? Min 5X/week ? ? ? ?  ?PT Plan Current plan remains appropriate  ? ? ?Co-evaluation   ?  ?  ?  ?  ? ?  ?AM-PAC PT "6 Clicks" Mobility   ?Outcome Measure ? Help needed turning from your back to your side while in a flat bed without using bedrails?: A Little ?Help needed moving from lying on your back to sitting on the side of a flat bed without using bedrails?: A Little ?Help needed moving to and from a bed to a chair (including a wheelchair)?: A Little ?Help needed standing up from a chair using your arms (e.g., wheelchair or bedside chair)?: A Little ?Help needed to walk in hospital room?: A Little ?Help needed climbing 3-5 steps with a railing? : Total ?6 Click Score: 16 ? ?  ?End of Session Equipment Utilized During Treatment: Gait belt;Back brace ?Activity Tolerance: Patient tolerated treatment well ?Patient left: in bed;with call bell/phone within reach;with bed alarm set ?Nurse Communication: Mobility status ?PT Visit Diagnosis: Unsteadiness on feet (R26.81);Pain ?Pain - part of body:  (back) ?  ? ? ?Time: 7517-0017 ?PT Time Calculation (min) (ACUTE ONLY): 23 min ? ?Charges:  $Gait Training: 8-22 mins ?$Therapeutic Activity: 8-22 mins          ?          ? ?Zenaida Niece, PT, DPT ?Acute Rehabilitation ?Pager: [phone] ?Office [phone] ? ? ? ?Zenaida Niece ?10/06/2021, 3:15 PM ? ?

## 2021-10-06 NOTE — Progress Notes (Signed)
OT Cancellation Note ? ?Patient Details ?Name: Mckenzie Lawrence ?MRN: [id number] ?DOB: 1946/02/17 ? ? ?Cancelled Treatment:    Reason Eval/Treat Not Completed: Patient declined, no reason specified. Pt had recently worked with PT, declined back OOB at this time, offered to come again later in the day, Pt declined. OT will attempt tomorrow morning prior to 10am. ? ?[name] ?10/06/2021, 4:18 PM ? ?Jesse Sans OTR/L ?Acute Rehabilitation Services ?Pager: [phone] ?Office: [phone] ? ?

## 2021-10-07 LAB — GLUCOSE, CAPILLARY
Glucose-Capillary: 133 mg/dL — ABNORMAL HIGH (ref 70–99)
Glucose-Capillary: 171 mg/dL — ABNORMAL HIGH (ref 70–99)
Glucose-Capillary: 150 mg/dL — ABNORMAL HIGH (ref 70–99)
Glucose-Capillary: 130 mg/dL — ABNORMAL HIGH (ref 70–99)

## 2021-10-07 MED ORDER — MAGIC MOUTHWASH
10.0000 mL | Freq: Four times a day (QID) | ORAL | Status: DC
  Administered 2021-10-07 – 2021-10-11 (×9): 10 mL via ORAL
  Filled 2021-10-07 (×19): qty 10

## 2021-10-07 MED ORDER — PANTOPRAZOLE SODIUM 40 MG PO TBEC
40.0000 mg | DELAYED_RELEASE_TABLET | Freq: Every day | ORAL | Status: DC
  Administered 2021-10-08 – 2021-10-11 (×4): 40 mg via ORAL
  Filled 2021-10-07 (×4): qty 1

## 2021-10-07 NOTE — Progress Notes (Signed)
Physical Therapy Treatment ?Patient Details ?Name: Mckenzie Lawrence ?MRN: [id number] ?DOB: 1945/09/25 ?Today's Date: 10/07/2021 ? ? ?History of Present Illness Pt is a 76 y/o female who presents s/p L4-S1 PLIF on 10/01/2021. PMH signficant for cataracts, COPD, CVA, DM, arthritis, HTN, orthostatic hypotension, prior back surgery, bil shoulder surgery. ? ?  ?PT Comments  ? ? Pt tolerates treatment well with increased ambulation distances. Pt demonstrates LE weakness with L knee instability during gait training, although experiencing no losses of balance. Pt will benefit from continued aggressive mobilization in an effort to improve activity tolerance. Initiation of stair training will be beneficial next session.   ?Recommendations for follow up therapy are one component of a multi-disciplinary discharge planning process, led by the attending physician.  Recommendations may be updated based on patient status, additional functional criteria and insurance authorization. ? ?Follow Up Recommendations ? Acute inpatient rehab (3hours/day) ?  ?  ?Assistance Recommended at Discharge Frequent or constant Supervision/Assistance  ?Patient can return home with the following A little help with walking and/or transfers;Assistance with cooking/housework;Assist for transportation;Help with stairs or ramp for entrance ?  ?Equipment Recommendations ? None recommended by PT  ?  ?Recommendations for Other Services   ? ? ?  ?Precautions / Restrictions Precautions ?Precautions: Fall;Back ?Precaution Booklet Issued: Yes (comment) ?Precaution Comments: recalling 3/3 back precautions ?Required Braces or Orthoses: Spinal Brace ?Spinal Brace: Lumbar corset;Applied in sitting position ?Restrictions ?Weight Bearing Restrictions: No  ?  ? ?Mobility ? Bed Mobility ?Overal bed mobility: Needs Assistance ?Bed Mobility: Rolling, Sit to Sidelying ?Rolling: Min assist ?  ?  ?  ?Sit to sidelying: Min assist ?  ?  ? ?Transfers ?Overall transfer level: Needs  assistance ?Equipment used: Rolling walker (2 wheels) ?Transfers: Sit to/from Stand ?Sit to Stand: Min guard, Supervision ?  ?  ?  ?  ?  ?  ?  ? ?Ambulation/Gait ?Ambulation/Gait assistance: Min guard ?Gait Distance (Feet): 50 Feet (50' x 2 trials) ?Assistive device: Rolling walker (2 wheels) ?Gait Pattern/deviations: Step-through pattern ?Gait velocity: reduced ?Gait velocity interpretation: <1.8 ft/sec, indicate of risk for recurrent falls ?  ?General Gait Details: pt with slowed step-through gait, mild L knee instability near end of 1st trial, none noted with 2nd trial ? ? ?Stairs ?  ?  ?  ?  ?  ? ? ?Wheelchair Mobility ?  ? ?Modified Rankin (Stroke Patients Only) ?  ? ? ?  ?Balance Overall balance assessment: Needs assistance ?Sitting-balance support: No upper extremity supported, Feet supported ?Sitting balance-Leahy Scale: Fair ?  ?  ?Standing balance support: Single extremity supported, Bilateral upper extremity supported, Reliant on assistive device for balance ?Standing balance-Leahy Scale: Poor ?  ?  ?  ?  ?  ?  ?  ?  ?  ?  ?  ?  ?  ? ?  ?Cognition Arousal/Alertness: Awake/alert ?Behavior During Therapy: [facility] for tasks assessed/performed, Agitated ?Overall Cognitive Status: Impaired/Different from baseline ?  ?  ?  ?  ?  ?  ?  ?  ?  ?  ?  ?  ?  ?  ?  ?Problem Solving: Slow processing ?  ?  ?  ? ?  ?Exercises   ? ?  ?General Comments General comments (skin integrity, edema, etc.): family present, VSS on RA ?  ?  ? ?Pertinent Vitals/Pain Pain Assessment ?Pain Assessment: Faces ?Faces Pain Scale: Hurts little more ?Pain Location: back ?Pain Descriptors / Indicators: Sore ?Pain Intervention(s): Monitored during session  ? ? ?  Home Living   ?  ?  ?  ?  ?  ?  ?  ?  ?  ?   ?  ?Prior Function    ?  ?  ?   ? ?PT Goals (current goals can now be found in the care plan section) Acute Rehab PT Goals ?Patient Stated Goal: to return to her prior level of function ?Progress towards PT goals: Progressing toward goals ? ?   ?Frequency ? ? ? Min 5X/week ? ? ? ?  ?PT Plan Current plan remains appropriate  ? ? ?Co-evaluation   ?  ?  ?  ?  ? ?  ?AM-PAC PT "6 Clicks" Mobility   ?Outcome Measure ? Help needed turning from your back to your side while in a flat bed without using bedrails?: A Little ?Help needed moving from lying on your back to sitting on the side of a flat bed without using bedrails?: A Little ?Help needed moving to and from a bed to a chair (including a wheelchair)?: A Little ?Help needed standing up from a chair using your arms (e.g., wheelchair or bedside chair)?: A Little ?Help needed to walk in hospital room?: A Little ?Help needed climbing 3-5 steps with a railing? : Total ?6 Click Score: 16 ? ?  ?End of Session Equipment Utilized During Treatment: Back brace ?Activity Tolerance: Patient tolerated treatment well ?Patient left: in bed;with call bell/phone within reach;with family/visitor present ?Nurse Communication: Mobility status ?PT Visit Diagnosis: Unsteadiness on feet (R26.81);Pain ?Pain - part of body:  (back) ?  ? ? ?Time: 6295-2841 ?PT Time Calculation (min) (ACUTE ONLY): 29 min ? ?Charges:  $Gait Training: 8-22 mins ?$Therapeutic Activity: 8-22 mins          ?          ? ?Zenaida Niece, PT, DPT ?Acute Rehabilitation ?Pager: [phone] ?Office [phone] ? ? ? ?Zenaida Niece ?10/07/2021, 1:52 PM ? ?

## 2021-10-07 NOTE — Progress Notes (Signed)
?[facility] ?Progress Note  ? ?76 y.o. female p/w back, L 1st toe, and R leg pain due to lumbar radiculopathy. Lumbar MRI and X rays showed spondylolisthesis, spinal stenosis, herniated disc and given she failed medical management -> S/p L3-4 and L4-5 decompression, instrumentation fusion and L3-4 discectomy but developed AKI on CKD4. ? ?Assessment/ Plan:   ?AKI on CKD3  Obstructive uropathy - unclear what BL renal function is as there appears to have been progression with cr incr from 1-1.2 -> 1.4 from 2013 -> 2019 before this hopitalization. UPC 1.39 likely from diabetic nephropathy. ?Likely multifactorial in the setting of recent spine surgery 5 days ago and not eating or drinking much since surgery, UTI, and obstruction. Cr had been improving with fluids.  ? ?CTX started for UTI (started 4/20 D1). Patient had urinary retention -> catheter placement. ?Cr 2.10<2.28<2.39<2.07<1.41. GFR 24. Baseline Cr ~ 2? ? ?UA with 50 glucose, small hgb, 100 protein, mod leuks. Urine culture with <10.000 colonies  -> CTX x3 days and stop (4/20-4/22) ? ?- holding Losartan for now given acute kidney injury  ?- avoid nephrotoxic agents   ?- strict Is and Os ? ?- started on tamsulosin -> foley removed 4/22 and will monitor for any signs of significant retention. If there is a large amount retained with functional outflow obstruction then  per urology, patient will need foley for a month given her spine surgery and treated as a spinal injury. Would then need f/u with urology outpatient in 1 mth to evaluate the functional outflow obstruction. ?  ?Lumbar radiculopathy, spondylolisthesis, spinal stenosis, DDD  ?Follows with neurosurgery  ?S/p L3-4 and L4-5 decompression, instrumentation fusion and L3-4 discectomy. ?  ?HTN ?BP over last 24 hours have ranged from 150-170's/60's ?On Losartan '100mg'$  daily at home (on hold in hosp w/ aki), Metoprolol succinate 100 mg daily  ?- holding Losartan due to AKI  ?  ?DM2 ?A1c 6.2   ?On SSI, managed per primary team  ? ?Subjective:   ?Pleasant but more alert  this AM. ?Denied f/cn/v/shortness of breath. Foley removed this am.  ? ?Objective:   ?BP (!) 169/61 (BP Location: Right Arm)   Pulse 80   Temp 97.7 ?F (36.5 ?C) (Oral)   Resp 18   Ht '5\' 6"'$  (1.676 m)   Wt 90.7 kg   SpO2 96%   BMI 32.27 kg/m?  ? ?Intake/Output Summary (Last 24 hours) at 10/07/2021 0850 ?Last data filed at 10/06/2021 2141 ?Gross per 24 hour  ?Intake --  ?Output 2100 ml  ?Net -2100 ml  ? ?Weight change:  ? ?Physical Exam: ?GEN: NAD, NCAT ?HEENT: No conjunctival pallor, EOMI ?NECK: Supple, no thyromegaly ?LUNGS: CTA B/L no rales, rhonchi or wheezing ?CV: RRR, No M/R/G ?ABD: SNDNT +BS  ?EXT: No lower extremity edema ? ? ? ?Imaging: ?No results found. ? ?Labs: ?BMET ?Recent Labs  ?Lab 10/01/21 ?0701 10/03/21 ?5681 10/04/21 ?2751 10/05/21 ?0403 10/06/21 ?1101  ?NA 134* 132* 134* 132* 131*  ?K 4.4 4.3 4.1 4.0 4.2  ?CL 104 105 106 108 105  ?CO2 22 21* 20* 18* 17*  ?GLUCOSE 117* 137* 122* 111* 178*  ?BUN 17 25* 25* 24* 20  ?CREATININE 2.07* 2.39* 2.28* 2.10* 1.94*  ?CALCIUM 9.0 8.0* 7.8* 7.5* 7.9*  ? ?CBC ?Recent Labs  ?Lab 10/06/21 ?1101  ?WBC 11.7*  ?HGB 7.0*  ?HCT 21.4*  ?MCV 96.4  ?PLT 247  ? ? ?Medications:   ? ? acetaminophen  1,000 mg Oral Q6H  ?  Or  ? acetaminophen  650 mg Rectal Q6H  ? calcitRIOL  0.25 mcg Oral Daily  ? Chlorhexidine Gluconate Cloth  6 each Topical Daily  ? docusate sodium  100 mg Oral BID  ? hydrALAZINE  25 mg Oral TID  ? insulin aspart  0-15 Units Subcutaneous TID WC  ? insulin aspart  0-5 Units Subcutaneous QHS  ? metoprolol succinate  100 mg Oral Daily  ? mirtazapine  7.5 mg Oral QHS  ? multivitamin  1 tablet Oral Daily  ? nicotine  14 mg Transdermal Daily  ? oxybutynin  5 mg Oral QHS  ? pantoprazole  40 mg Oral Daily  ? rosuvastatin  5 mg Oral Daily  ? sodium chloride flush  3 mL Intravenous Q12H  ? tamsulosin  0.4 mg Oral Daily  ? ? ? ? ?Otelia Santee, MD ?10/07/2021, 8:50 AM  ? ?

## 2021-10-07 NOTE — Progress Notes (Signed)
Occupational Therapy Treatment ?Patient Details ?Name: Mckenzie Lawrence ?MRN: [id number] ?DOB: Nov 28, 1945 ?Today's Date: 10/07/2021 ? ? ?History of present illness Pt is a 76 y/o female who presents s/p L4-S1 PLIF on 10/01/2021. PMH signficant for cataracts, COPD, CVA, DM, arthritis, HTN, orthostatic hypotension, prior back surgery, bil shoulder surgery. ?  ?OT comments ? Pt progressing towards OT goals this session. Pt very motivated this morning and agreeable to shower. Pt ambulated into bathroom with min A RW, took full shower with mod A for LB, mod A for back (noticed bandage starting to pull RN aware - only let water run over back no scrubbing and tried to avoid bandage as much as possible), Pt able to complete her own hair washing, face, underarms, under pannus, perform sit<>stand utilizing grab bars (with cues for safety) with min A, mod A for peri care. She performed seated grooming post-shower completing hair brushing, oral care. She was min A for UB dressing post-shower including brace, max A to don socks and slippers (unable to perform figure 4 at this time), min A for ambulation to chair with cues for hand placement for sitting in recliner. Pt will require comprehensive inpatient rehab to maximize safety and independence in ADL and functional transfers and return to PLOF. Pt continues to demonstrate activity tolerance required of AIR, has excellent family support for after discharge, and she demonstrates the necessity of AIR as she needs continued education in compensatory strategies and AE, conditioning and ability to build up activity tolerance, and continued reinforcement of back precautions during functional ADL. OT will continue to follow acutely.   ? ?Recommendations for follow up therapy are one component of a multi-disciplinary discharge planning process, led by the attending physician.  Recommendations may be updated based on patient status, additional functional criteria and insurance  authorization. ?   ?Follow Up Recommendations ? Acute inpatient rehab (3hours/day)  ?  ?Assistance Recommended at Discharge Frequent or constant Supervision/Assistance  ?Patient can return home with the following ? A little help with walking and/or transfers;A lot of help with bathing/dressing/bathroom;Assistance with cooking/housework;Assist for transportation;Help with stairs or ramp for entrance ?  ?Equipment Recommendations ? Other (comment) (defer to next venue of care)  ?  ?Recommendations for Other Services PT consult ? ?  ?Precautions / Restrictions Precautions ?Precautions: Fall;Back ?Precaution Booklet Issued: Yes (comment) ?Precaution Comments: recalling 3/3 back precautions ?Required Braces or Orthoses: Spinal Brace ?Spinal Brace:  (no lumbar corset, due to showering) ?Restrictions ?Weight Bearing Restrictions: No  ? ? ?  ? ?Mobility Bed Mobility ?  ?  ?  ?  ?  ?  ?  ?General bed mobility comments: sitting EOB at beginning of session and left in chair ?  ? ?Transfers ?Overall transfer level: Needs assistance ?Equipment used: Rolling walker (2 wheels) ?Transfers: Sit to/from Stand ?Sit to Stand: Min assist ?  ?  ?  ?  ?  ?General transfer comment: cues for safe hand placement, rocking for momentum, and increased anterior trunk lean ?  ?  ?Balance Overall balance assessment: Needs assistance ?Sitting-balance support: No upper extremity supported, Feet supported ?Sitting balance-Leahy Scale: Fair ?  ?  ?Standing balance support: Bilateral upper extremity supported, Reliant on assistive device for balance ?Standing balance-Leahy Scale: Poor ?Standing balance comment: relies on UE support ?  ?  ?  ?  ?  ?  ?  ?  ?  ?  ?  ?   ? ?ADL either performed or assessed with clinical judgement  ? ?ADL  Overall ADL's : Needs assistance/impaired ?  ?  ?Grooming: Oral care;Wash/dry face;Brushing hair;Set up;Sitting ?Grooming Details (indicate cue type and reason): after shower ?Upper Body Bathing: Moderate  assistance;Sitting ?Upper Body Bathing Details (indicate cue type and reason): assist for back, pt able to reach and wash arm pits, under pannus ?Lower Body Bathing: Moderate assistance ?Lower Body Bathing Details (indicate cue type and reason): Pt can wash from the knees up, requires assist for below knee - unable to perform figure 4 ?Upper Body Dressing : Minimal assistance;Sitting ?Upper Body Dressing Details (indicate cue type and reason): donning gown and brace management ?Lower Body Dressing: Maximal assistance;Sitting/lateral leans ?Lower Body Dressing Details (indicate cue type and reason): donning socks/slippers ?Toilet Transfer: Minimal assistance;Ambulation;Rolling walker (2 wheels) ?Toilet Transfer Details (indicate cue type and reason): vc for safe hand placement with each transfer ?Toileting- Clothing Manipulation and Hygiene: Maximal assistance;Sit to/from stand ?Toileting - Clothing Manipulation Details (indicate cue type and reason): able to maintain standing for peri care ?  ?  ?Functional mobility during ADLs: Minimal assistance;Rolling walker (2 wheels);Cueing for safety ?General ADL Comments: Pt making good progress towards OT goals, would benefit from education for AE to access LB and assist with ADL as well as back precaution reinforcement during functional activities ?  ? ?Extremity/Trunk Assessment Upper Extremity Assessment ?Upper Extremity Assessment: Generalized weakness ?  ?  ?  ?  ?  ? ?Vision   ?Vision Assessment?: No apparent visual deficits ?  ?Perception   ?  ?Praxis   ?  ? ?Cognition Arousal/Alertness: Awake/alert ?Behavior During Therapy: [facility] for tasks assessed/performed, Agitated ?Overall Cognitive Status: Impaired/Different from baseline ?Area of Impairment: Problem solving, Safety/judgement ?  ?  ?  ?  ?  ?  ?  ?  ?  ?  ?  ?  ?Safety/Judgement: Decreased awareness of safety ?  ?Problem Solving: Slow processing ?General Comments: Pt requires min cues for back precautions during  functional tasks, cues for safety with RW ?  ?  ?   ?Exercises   ? ?  ?Shoulder Instructions   ? ? ?  ?General Comments daughter present and helpful thruoghout session, VSS on RA  ? ? ?Pertinent Vitals/ Pain       Pain Assessment ?Pain Assessment: 0-10 ?Pain Score: 6  ?Pain Location: back ?Pain Descriptors / Indicators: Sore ?Pain Intervention(s): Monitored during session, Repositioned, Patient requesting pain meds-RN notified ? ?Home Living   ?  ?  ?  ?  ?  ?  ?  ?  ?  ?  ?  ?  ?  ?  ?  ?  ?  ?  ? ?  ?Prior Functioning/Environment    ?  ?  ?  ?   ? ?Frequency ? Min 2X/week  ? ? ? ? ?  ?Progress Toward Goals ? ?OT Goals(current goals can now be found in the care plan section) ? Progress towards OT goals: Progressing toward goals ? ?Acute Rehab OT Goals ?Patient Stated Goal: get to inpatient rehab to maximize independence ?OT Goal Formulation: With patient/family ?Time For Goal Achievement: 10/16/21 ?Potential to Achieve Goals: Good  ?Plan Discharge plan needs to be updated   ? ?Co-evaluation ? ? ?   ?  ?  ?  ?  ? ?  ?AM-PAC OT "6 Clicks" Daily Activity     ?Outcome Measure ? ? Help from another person eating meals?: A Little ?Help from another person taking care of personal grooming?: A Lot ?Help from another person  toileting, which includes using toliet, bedpan, or urinal?: A Lot ?Help from another person bathing (including washing, rinsing, drying)?: A Lot ?Help from another person to put on and taking off regular upper body clothing?: A Lot ?Help from another person to put on and taking off regular lower body clothing?: A Lot ?6 Click Score: 13 ? ?  ?End of Session Equipment Utilized During Treatment: Gait belt;Rolling walker (2 wheels);Back brace ? ?OT Visit Diagnosis: Other abnormalities of gait and mobility (R26.89);Muscle weakness (generalized) (M62.81);Pain ?Pain - part of body:  (back) ?  ?Activity Tolerance Patient tolerated treatment well ?  ?Patient Left in chair;with call bell/phone within reach;with  family/visitor present ?  ?Nurse Communication Mobility status (bath done, linens changed) ?  ? ?   ? ?Time: 2162-4469 ?OT Time Calculation (min): 49 min ? ?Charges: OT General Charges ?$OT Visit: 1 Visit ?OT Treatments ?$Self Care/H

## 2021-10-07 NOTE — Progress Notes (Signed)
? ?  Providing Compassionate, Quality Care - Together ? ? ?Subjective: ?Patient reports sores in the back of her throat with difficulty swallowing. Requesting mouthwash. Patient showered this morning and reports she worked hard with therapies yesterday. ? ?Objective: ?Vital signs in last 24 hours: ?Temp:  [97.4 ?F (36.3 ?C)-98 ?F (36.7 ?C)] 97.7 ?F (36.5 ?C) (04/23 0740) ?Pulse Rate:  [76-81] 80 (04/23 0740) ?Resp:  [16-20] 18 (04/23 0740) ?BP: (137-172)/(53-77) 169/61 (04/23 0740) ?SpO2:  [96 %-100 %] 96 % (04/23 0740) ? ?Intake/Output from previous day: ?04/22 0701 - 04/23 0700 ?In: -  ?Out: 2100 [Urine:2100] ?Intake/Output this shift: ?No intake/output data recorded. ? ? ? ?Lab Results: ?Recent Labs  ?  10/06/21 ?1101  ?WBC 11.7*  ?HGB 7.0*  ?HCT 21.4*  ?PLT 247  ? ?BMET ?Recent Labs  ?  10/05/21 ?0403 10/06/21 ?1101  ?NA 132* 131*  ?K 4.0 4.2  ?CL 108 105  ?CO2 18* 17*  ?GLUCOSE 111* 178*  ?BUN 24* 20  ?CREATININE 2.10* 1.94*  ?CALCIUM 7.5* 7.9*  ? ? ?Studies/Results: ?No results found. ? ?Assessment/Plan: ?Patient underwent L4-5 and L5-S1 PLIF by Dr. Arnoldo Morale on 10/01/2021. Her post operative course has been complicated by AKI on CKD4. Nephrology is following the patient. Patient recommended for CIR, but insurance has denied. Family is in the process of appealing decision. Patient with much improved mental status and pain control since 10/06/2021. Hgb/HCT 7.0 on 10/06/2021. ?  ? LOS: 6 days  ? ?-Magic Mouthwash ordered for what appears to be oral thrush. ?-Patient with anemia , but does not appear to be symptomatic. Will recheck CBC in the AM. ?-Continue to encourage mobilization. Patient's family still working on appealing insurance denial of acute inpatient rehab. Therapies still recommending CIR.  ? ? ?Viona Gilmore, DNP, AGNP-C ?Nurse Practitioner ? ?[facility] ?[address], [address], [place], [place] [address] ?P: [phone]    F: [phone] ? ?10/07/2021, 11:12  AM ? ? ? ? ?

## 2021-10-08 LAB — GLUCOSE, CAPILLARY
Glucose-Capillary: 144 mg/dL — ABNORMAL HIGH (ref 70–99)
Glucose-Capillary: 168 mg/dL — ABNORMAL HIGH (ref 70–99)
Glucose-Capillary: 177 mg/dL — ABNORMAL HIGH (ref 70–99)
Glucose-Capillary: 164 mg/dL — ABNORMAL HIGH (ref 70–99)

## 2021-10-08 LAB — CBC
HCT: 20.3 % — ABNORMAL LOW (ref 36.0–46.0)
Hemoglobin: 6.9 g/dL — CL (ref 12.0–15.0)
MCH: 31.8 pg (ref 26.0–34.0)
MCHC: 34 g/dL (ref 30.0–36.0)
MCV: 93.5 fL (ref 80.0–100.0)
Platelets: 326 10*3/uL (ref 150–400)
RBC: 2.17 MIL/uL — ABNORMAL LOW (ref 3.87–5.11)
RDW: 13.5 % (ref 11.5–15.5)
WBC: 10.9 10*3/uL — ABNORMAL HIGH (ref 4.0–10.5)
nRBC: 0 % (ref 0.0–0.2)

## 2021-10-08 LAB — BASIC METABOLIC PANEL
Anion gap: 8 (ref 5–15)
BUN: 17 mg/dL (ref 8–23)
CO2: 20 mmol/L — ABNORMAL LOW (ref 22–32)
Calcium: 8.1 mg/dL — ABNORMAL LOW (ref 8.9–10.3)
Chloride: 104 mmol/L (ref 98–111)
Creatinine, Ser: 2.01 mg/dL — ABNORMAL HIGH (ref 0.44–1.00)
GFR, Estimated: 26 mL/min — ABNORMAL LOW (ref >60)
Glucose, Bld: 144 mg/dL — ABNORMAL HIGH (ref 70–99)
Potassium: 4.6 mmol/L (ref 3.5–5.1)
Sodium: 132 mmol/L — ABNORMAL LOW (ref 135–145)

## 2021-10-08 LAB — HEMOGLOBIN AND HEMATOCRIT, BLOOD
HCT: 31 % — ABNORMAL LOW (ref 36.0–46.0)
Hemoglobin: 10.2 g/dL — ABNORMAL LOW (ref 12.0–15.0)

## 2021-10-08 LAB — PREPARE RBC (CROSSMATCH)

## 2021-10-08 NOTE — Progress Notes (Signed)
?   10/08/21 1030  ?Clinical Encounter Type  ?Visited With Patient and family together  ?Visit Type Initial;Spiritual support  ?Referral From Chaplain  ?Consult/Referral To Chaplain  ? ?Chaplain responded to a hand off request to visit with patient, Mckenzie Lawrence. Family had requested a priest and Father Barnabas Lister was able to come in on Saturday. Patient and family was appreciative of his visit.  ?Amee said she was focusing on resting today she has several test upcoming and she felt tired.  ?I shared that if Jacki wanted a chaplain to please let the nurse know.  ? ?Danice Goltz  ?Chaplain  ?[facility]  ?[phone] ?

## 2021-10-08 NOTE — Progress Notes (Addendum)
?[name] ?Progress Note  ? ?76 y.o. female p/w back, L 1st toe, and R leg pain due to lumbar radiculopathy. Lumbar MRI and X rays showed spondylolisthesis, spinal stenosis, herniated disc and given she failed medical management -> S/p L3-4 and L4-5 decompression, instrumentation fusion and L3-4 discectomy but developed AKI on CKD4 likely multifactorial in the setting of recent spine surgery and not eating or drinking much since surgery, UTI, and obstruction. Cr had been improving with fluids.  ? ?Assessment/ Plan:   ? ?AKI on CKD3  Obstructive uropathy - unclear what BL renal function is as there appears to have been progression with cr incr from 1-1.2 -> 1.4 from 2013 -> 2019 before this hopitalization. UPC 1.39 likely from diabetic nephropathy. ?Likely multifactorial in the setting of recent spine surgery and not eating or drinking much since surgery, UTI, and obstruction. Cr had been improving with fluids.  ?CTX started for UTI (started 4/20-4/22). Patient had urinary retention -> catheter placed and now removed 4/22 ?Cr 2.01<1.94<2.10<2.28<2.39<2.07<1.41. GFR 24. Baseline Cr ~ 2? ?UOP 755m in am with 2 unmeasured voids, 1 unmeasured in pm. Net up total 4.4L  ?- holding Losartan for now given acute kidney injury  ?- avoid nephrotoxic agents   ?- strict Is and Os ?- contiue Flomax  ?- will monitor for any signs of significant retention. If there is a large amount retained with functional outflow obstruction then  per urology, patient will need foley for a month given her spine surgery and treated as a spinal injury. Would then need f/u with urology outpatient in 1 mth to evaluate the functional outflow obstruction. ?- nephrology will sign off at this time  ?  ?Lumbar radiculopathy, spondylolisthesis, spinal stenosis, DDD  ?Follows with neurosurgery  ?S/p L3-4 and L4-5 decompression, instrumentation fusion and L3-4 discectomy. ?  ?HTN ?On Losartan '100mg'$  daily at home (on hold in hosp w/ aki),  Metoprolol succinate 100 mg daily  ?- holding Losartan due to AKI  ?  ?DM2 ?A1c 6.2  ?On SSI, managed per primary team  ? ?5. Anemia ?Hgb 6.9 this morning down from 7 2 days ago. Patient will receive 1u pRBC ?Likely in the setting of recent surgery as well as anemia of chronic disease. Hgb outpatient on 4/5 was 10.9 ? ?Subjective:   ? ?This morning patient had a hgb of 6.9. Potentially post op related tho no significant blood loss has been noted. Patient does endorse feeling fatigued and just wants to sleep. Feels like she has been urinating well and denies any urinary symptoms. States she was able to tolerate the foley very well without issues. She wants a purewick in place.   ? ?Objective:   ?BP (!) 179/61   Pulse 96   Temp 97.6 ?F (36.4 ?C)   Resp 20   Ht '5\' 6"'$  (1.676 m)   Wt 90.7 kg   SpO2 100%   BMI 32.27 kg/m?  ? ?Intake/Output Summary (Last 24 hours) at 10/08/2021 1025 ?Last data filed at 10/07/2021 2139 ?Gross per 24 hour  ?Intake 123 ml  ?Output --  ?Net 123 ml  ? ?Weight change:  ? ?Physical Exam: ?General: alert, laying in bed, NAD ?CV: RRR no murmurs ?Resp: CTAB normal WOB on RA  ?Abd: soft, non distended, non tender  ? ?Imaging: ?No results found. ? ?Labs: ?BMET ?Recent Labs  ?Lab 10/03/21 ?0703504/20/23 ?0009304/21/23 ?0403 10/06/21 ?1101 10/08/21 ?0431  ?NA 132* 134* 132* 131* 132*  ?K 4.3 4.1 4.0 4.2  4.6  ?CL 105 106 108 105 104  ?CO2 21* 20* 18* 17* 20*  ?GLUCOSE 137* 122* 111* 178* 144*  ?BUN 25* 25* 24* 20 17  ?CREATININE 2.39* 2.28* 2.10* 1.94* 2.01*  ?CALCIUM 8.0* 7.8* 7.5* 7.9* 8.1*  ? ?CBC ?Recent Labs  ?Lab 10/06/21 ?1101 10/08/21 ?0431  ?WBC 11.7* 10.9*  ?HGB 7.0* 6.9*  ?HCT 21.4* 20.3*  ?MCV 96.4 93.5  ?PLT 247 326  ? ? ?Medications:   ? ? acetaminophen  1,000 mg Oral Q6H  ? Or  ? acetaminophen  650 mg Rectal Q6H  ? calcitRIOL  0.25 mcg Oral Daily  ? Chlorhexidine Gluconate Cloth  6 each Topical Daily  ? docusate sodium  100 mg Oral BID  ? hydrALAZINE  25 mg Oral TID  ? insulin aspart   0-15 Units Subcutaneous TID WC  ? insulin aspart  0-5 Units Subcutaneous QHS  ? magic mouthwash  10 mL Oral QID  ? metoprolol succinate  100 mg Oral Daily  ? mirtazapine  7.5 mg Oral QHS  ? multivitamin  1 tablet Oral Daily  ? nicotine  14 mg Transdermal Daily  ? oxybutynin  5 mg Oral QHS  ? pantoprazole  40 mg Oral Daily  ? rosuvastatin  5 mg Oral Daily  ? sodium chloride flush  3 mL Intravenous Q12H  ? tamsulosin  0.4 mg Oral Daily  ? ? ? ? ?Sonia Side DO  ?10/08/2021, 10:25 AM   ?

## 2021-10-08 NOTE — Progress Notes (Signed)
PT Cancellation Note ? ?Patient Details ?Name: Mckenzie Lawrence ?MRN: [id number] ?DOB: 07/28/1945 ? ? ?Cancelled Treatment:    Reason Eval/Treat Not Completed: Medical issues which prohibited therapy; noted hemoglobin 6.9 and daughter reports had pre-syncopal episode earlier.  Now getting blood transfusion.  Will check back late today or tomorrow.  ? ? ?Reginia Naas ?10/08/2021, 12:45 PM ?Magda Kiel, PT ?Acute Rehabilitation Services ?AXENM:[phone] ?Office:[phone] ?10/08/2021 ? ?

## 2021-10-08 NOTE — Progress Notes (Addendum)
Subjective: ?The patient is alert and pleasant.  Her husband and daughter at the bedside.  She complains of fatigue.  She requested a pure wick catheter this evening. ? ?Objective: ?Vital signs in last 24 hours: ?Temp:  [97.6 ?F (36.4 ?C)-97.9 ?F (36.6 ?C)] 97.6 ?F (36.4 ?C) (04/24 0340) ?Pulse Rate:  [71-96] 96 (04/24 0838) ?Resp:  [20] 20 (04/23 1451) ?BP: (145-179)/(48-96) 179/61 (04/24 7001) ?SpO2:  [96 %-100 %] 100 % (04/24 0340) ?Estimated body mass index is 32.27 kg/m? as calculated from the following: ?  Height as of this encounter: '5\' 6"'$  (1.676 m). ?  Weight as of this encounter: 90.7 kg. ? ? ?Intake/Output from previous day: ?04/23 0701 - 04/24 0700 ?In: 27 [P.O.:120; I.V.:3] ?Out: 700 [Urine:700] ?Intake/Output this shift: ?No intake/output data recorded. ? ?Physical exam patient is alert and pleasant.  She is in no apparent distress.  Her lumbar wound is healing well.  The honeycomb dressing is clean and dry.  Her lower extremity strength is normal. ? ?Lab Results: ?Recent Labs  ?  10/06/21 ?1101 10/08/21 ?0431  ?WBC 11.7* 10.9*  ?HGB 7.0* 6.9*  ?HCT 21.4* 20.3*  ?PLT 247 326  ? ?BMET ?Recent Labs  ?  10/06/21 ?1101  ?NA 131*  ?K 4.2  ?CL 105  ?CO2 17*  ?GLUCOSE 178*  ?BUN 20  ?CREATININE 1.94*  ?CALCIUM 7.9*  ? ? ?Studies/Results: ?No results found. ? ?Assessment/Plan: ?Postop day #7: The patient is progressing slowly.  I think her anemia is part of the issue.  I discussed transfusion of packed red cells and the risk, benefits, alternatives, etc.  I have answered all their questions.  She would like to proceed with a transfusion.  We/they are in the process of appealing her inpatient rehab denial. ? ?Hyponatremia, renal insufficiency: A BMP is pending.  I appreciate renal's help. ? ? ? LOS: 7 days  ? ? ? ?[name] ?10/08/2021, 9:39 AM ? ? ? ? ?Patient ID: BRODY KUMP, female   DOB: 1945-10-03, 76 y.o.   MRN: [id number] ? ?

## 2021-10-08 NOTE — Progress Notes (Signed)
Inpatient Rehabilitation Admissions Coordinator  ? ?I met with patient and her spouse and daughter at bedside. Daughter states second level appeal for CIR Admit has been denied today and they are seeking 3rd level appeal. I will follow. ? ?Danne Baxter, RN, MSN ?Rehab Admissions Coordinator ?[phone] ?10/08/2021 11:35 AM ? ?

## 2021-10-09 LAB — TYPE AND SCREEN
ABO/RH(D): A POS
Antibody Screen: NEGATIVE
Unit division: 0
Unit division: 0

## 2021-10-09 LAB — CBC
HCT: 29.6 % — ABNORMAL LOW (ref 36.0–46.0)
Hemoglobin: 9.9 g/dL — ABNORMAL LOW (ref 12.0–15.0)
MCH: 30.3 pg (ref 26.0–34.0)
MCHC: 33.4 g/dL (ref 30.0–36.0)
MCV: 90.5 fL (ref 80.0–100.0)
Platelets: 384 10*3/uL (ref 150–400)
RBC: 3.27 MIL/uL — ABNORMAL LOW (ref 3.87–5.11)
RDW: 14.6 % (ref 11.5–15.5)
WBC: 13 10*3/uL — ABNORMAL HIGH (ref 4.0–10.5)
nRBC: 0 % (ref 0.0–0.2)

## 2021-10-09 LAB — BPAM RBC
Blood Product Expiration Date: 202305132359
Blood Product Expiration Date: 202305132359
ISSUE DATE / TIME: 202304241202
ISSUE DATE / TIME: 202304241534
Unit Type and Rh: 6200
Unit Type and Rh: 6200

## 2021-10-09 LAB — GLUCOSE, CAPILLARY
Glucose-Capillary: 150 mg/dL — ABNORMAL HIGH (ref 70–99)
Glucose-Capillary: 198 mg/dL — ABNORMAL HIGH (ref 70–99)
Glucose-Capillary: 185 mg/dL — ABNORMAL HIGH (ref 70–99)
Glucose-Capillary: 145 mg/dL — ABNORMAL HIGH (ref 70–99)

## 2021-10-09 MED ORDER — POLYETHYLENE GLYCOL 3350 17 G PO PACK
17.0000 g | PACK | Freq: Every day | ORAL | Status: DC
  Administered 2021-10-09: 17 g via ORAL
  Filled 2021-10-09 (×3): qty 1

## 2021-10-09 NOTE — Progress Notes (Signed)
Physical Therapy Treatment ?Patient Details ?Name: Mckenzie Lawrence ?MRN: [id number] ?DOB: 1946/04/16 ?Today's Date: 10/09/2021 ? ? ?History of Present Illness Pt is a 76 y/o female who presents s/p L4-S1 PLIF on 10/01/2021. PMH signficant for cataracts, COPD, CVA, DM, arthritis, HTN, orthostatic hypotension, prior back surgery, bil shoulder surgery. ? ?  ?PT Comments  ? ? Patient progressing with distance with ambulation.  Well fatigued following ambulation needing seated rest.  She was encouraged to plan therapies when she can participate the best to progress as able.  Family still hopeful for acute inpatient rehab.  PT will follow.    ?Recommendations for follow up therapy are one component of a multi-disciplinary discharge planning process, led by the attending physician.  Recommendations may be updated based on patient status, additional functional criteria and insurance authorization. ? ?Follow Up Recommendations ? Acute inpatient rehab (3hours/day) ?  ?  ?Assistance Recommended at Discharge Frequent or constant Supervision/Assistance  ?Patient can return home with the following A little help with walking and/or transfers;Assistance with cooking/housework;Assist for transportation;Help with stairs or ramp for entrance ?  ?Equipment Recommendations ? None recommended by PT  ?  ?Recommendations for Other Services   ? ? ?  ?Precautions / Restrictions Precautions ?Precautions: Fall;Back ?Required Braces or Orthoses: Spinal Brace ?Spinal Brace: Lumbar corset;Applied in sitting position  ?  ? ?Mobility ? Bed Mobility ?  ?  ?  ?  ?  ?  ?  ?General bed mobility comments: up in chair ?  ? ?Transfers ?Overall transfer level: Needs assistance ?Equipment used: Rolling walker (2 wheels) ?Transfers: Sit to/from Stand ?Sit to Stand: Min guard ?  ?  ?  ?  ?  ?General transfer comment: cues for hand placement ?  ? ?Ambulation/Gait ?Ambulation/Gait assistance: Min guard ?Gait Distance (Feet): 120 Feet ?Assistive device: Rolling  walker (2 wheels) ?Gait Pattern/deviations: Step-through pattern, Decreased stride length ?  ?  ?  ?General Gait Details: flexed posture stopping to straighten with cues x 2 ? ? ?Stairs ?  ?  ?  ?  ?  ? ? ?Wheelchair Mobility ?  ? ?Modified Rankin (Stroke Patients Only) ?  ? ? ?  ?Balance Overall balance assessment: Needs assistance ?Sitting-balance support: No upper extremity supported ?Sitting balance-Leahy Scale: Fair ?  ?  ?Standing balance support: Bilateral upper extremity supported ?Standing balance-Leahy Scale: Poor ?Standing balance comment: relies on UE support ?  ?  ?  ?  ?  ?  ?  ?  ?  ?  ?  ?  ? ?  ?Cognition Arousal/Alertness: Awake/alert ?Behavior During Therapy: [facility] for tasks assessed/performed, Agitated ?Overall Cognitive Status: Impaired/Different from baseline ?Area of Impairment: Safety/judgement, Problem solving ?  ?  ?  ?  ?  ?  ?  ?  ?  ?  ?Memory: Decreased recall of precautions ?  ?  ?  ?Problem Solving: Slow processing ?  ?  ?  ? ?  ?Exercises General Exercises - Lower Extremity ?Ankle Circles/Pumps: AROM, 10 reps, Both, Seated ?Long Arc Quad: AROM, Both, 5 reps, Seated ? ?  ?General Comments General comments (skin integrity, edema, etc.): family present and supportive; encouraged timing of session for best performance as earlier attempted and pt stating "Just want to get it over with."  Encouraged to eat first and rest as had busy morning. ?  ?  ? ?Pertinent Vitals/Pain Pain Assessment ?Pain Score: 5  ?Pain Location: back ?Pain Descriptors / Indicators: Sore ?Pain Intervention(s): Monitored during session, Repositioned  ? ? ?  Home Living   ?  ?  ?  ?  ?  ?  ?  ?  ?  ?   ?  ?Prior Function    ?  ?  ?   ? ?PT Goals (current goals can now be found in the care plan section) Progress towards PT goals: Progressing toward goals ? ?  ?Frequency ? ? ? Min 5X/week ? ? ? ?  ?PT Plan Current plan remains appropriate  ? ? ?Co-evaluation   ?  ?  ?  ?  ? ?  ?AM-PAC PT "6 Clicks" Mobility   ?Outcome  Measure ? Help needed turning from your back to your side while in a flat bed without using bedrails?: A Little ?Help needed moving from lying on your back to sitting on the side of a flat bed without using bedrails?: A Little ?Help needed moving to and from a bed to a chair (including a wheelchair)?: A Little ?Help needed standing up from a chair using your arms (e.g., wheelchair or bedside chair)?: A Little ?Help needed to walk in hospital room?: A Little ?Help needed climbing 3-5 steps with a railing? : Total ?6 Click Score: 16 ? ?  ?End of Session Equipment Utilized During Treatment: Gait belt;Back brace ?Activity Tolerance: Patient tolerated treatment well ?Patient left: with call bell/phone within reach;in chair;with family/visitor present (handoff to OT) ?  ?PT Visit Diagnosis: Unsteadiness on feet (R26.81);Pain ?Pain - part of body:  (back) ?  ? ? ?Time: 9381-8299 ?PT Time Calculation (min) (ACUTE ONLY): 19 min ? ?Charges:  $Gait Training: 8-22 mins          ?          ? ?Magda Kiel, PT ?Acute Rehabilitation Services ?BZJIR:[phone] ?[phone] ?10/09/2021 ? ? ? ?Reginia Naas ?10/09/2021, 2:01 PM ? ?

## 2021-10-09 NOTE — Progress Notes (Signed)
Occupational Therapy Treatment ?Patient Details ?Name: Mckenzie Lawrence ?MRN: [id number] ?DOB: 19-Oct-1945 ?Today's Date: 10/09/2021 ? ? ?History of present illness Pt is a 76 y/o female who presents s/p L4-S1 PLIF on 10/01/2021. PMH signficant for cataracts, COPD, CVA, DM, arthritis, HTN, orthostatic hypotension, prior back surgery, bil shoulder surgery. ?  ?OT comments ? Pt continues to make excellent progress towards OT goals and remains candidate for comprehensive inpatient rehab. Today despite having recent completion of PT, she was agreeable to participation in OT session. Initiated session with AE education: reviewing long handle shoe horn, sock donner, grabber/reacher, as well as long handle sponge for bathing. Pt listened to education and watched demo - then able to perform teach back. Pt then able to perform toilet transfer at min guard, peri care with min A for underwear management, and min guard for sink level grooming. Inpatient rehab remains essential to maximize safety and independence in ADL and functional transfers as well as continue education for compensatory strategies and AE/DME with new back sx/back precautions.   ? ?Recommendations for follow up therapy are one component of a multi-disciplinary discharge planning process, led by the attending physician.  Recommendations may be updated based on patient status, additional functional criteria and insurance authorization. ?   ?Follow Up Recommendations ? Acute inpatient rehab (3hours/day)  ?  ?Assistance Recommended at Discharge Frequent or constant Supervision/Assistance  ?Patient can return home with the following ? A little help with walking and/or transfers;A lot of help with bathing/dressing/bathroom;Assistance with cooking/housework;Assist for transportation;Help with stairs or ramp for entrance ?  ?Equipment Recommendations ? Other (comment) (defer to next venue of care)  ?  ?Recommendations for Other Services PT consult ? ?  ?Precautions /  Restrictions Precautions ?Precautions: Fall;Back ?Precaution Booklet Issued: Yes (comment) ?Precaution Comments: recalling 3/3 back precautions ?Required Braces or Orthoses: Spinal Brace ?Spinal Brace: Lumbar corset;Applied in sitting position ?Restrictions ?Weight Bearing Restrictions: No  ? ? ?  ? ?Mobility Bed Mobility ?Overal bed mobility: Needs Assistance ?Bed Mobility: Rolling, Sit to Sidelying ?Rolling: Min guard ?  ?  ?  ?Sit to sidelying: Min assist (small assist for BLE) ?General bed mobility comments: overall min guard, Pt requires extra time, good log roll technique ?  ? ?Transfers ?Overall transfer level: Needs assistance ?Equipment used: Rolling walker (2 wheels) ?Transfers: Sit to/from Stand ?Sit to Stand: Min guard ?  ?  ?  ?  ?  ?General transfer comment: cues for hand placement ?  ?  ?Balance Overall balance assessment: Needs assistance ?Sitting-balance support: No upper extremity supported ?Sitting balance-Leahy Scale: Fair ?  ?  ?Standing balance support: Bilateral upper extremity supported ?Standing balance-Leahy Scale: Poor ?Standing balance comment: relies on UE support ?  ?  ?  ?  ?  ?  ?  ?  ?  ?  ?  ?   ? ?ADL either performed or assessed with clinical judgement  ? ?ADL Overall ADL's : Needs assistance/impaired ?  ?  ?Grooming: Wash/dry hands;Min guard;Standing ?Grooming Details (indicate cue type and reason): decreased activity tolerance for standing grooming ?  ?  ?  ?  ?Upper Body Dressing : Minimal assistance;Sitting ?Upper Body Dressing Details (indicate cue type and reason): brace education, min A for don/doff, vc for reminders of sequencing of brace ?Lower Body Dressing: Sitting/lateral leans;Min guard;With adaptive equipment ?Lower Body Dressing Details (indicate cue type and reason): spent significant time educating, demonstrating long handle shoe horn, sock donner, grabber/reacher - Pt able to perform teach back ?Toilet  Transfer: Minimal assistance;Ambulation;Rolling walker (2  wheels) ?Toilet Transfer Details (indicate cue type and reason): vc for safe hand placement with each transfer ?Toileting- Clothing Manipulation and Hygiene: Sit to/from stand;Minimal assistance ?Toileting - Clothing Manipulation Details (indicate cue type and reason): vc for back precautions with peri care, Pt able to make adequate compensation ?  ?  ?Functional mobility during ADLs: Minimal assistance;Rolling walker (2 wheels);Cueing for safety ?General ADL Comments: Pt making good progress towards OT goals, focued on toilet transfer, standing grooming, brace management, and AE ?  ? ?Extremity/Trunk Assessment Upper Extremity Assessment ?Upper Extremity Assessment: Generalized weakness ?  ?Lower Extremity Assessment ?Lower Extremity Assessment: Defer to PT evaluation ?  ?  ?  ? ?Vision   ?Vision Assessment?: No apparent visual deficits ?  ?Perception   ?  ?Praxis   ?  ? ?Cognition Arousal/Alertness: Awake/alert ?Behavior During Therapy: [facility] for tasks assessed/performed, Agitated ?Overall Cognitive Status: Within Functional Limits for tasks assessed ?  ?  ?  ?  ?  ?  ?  ?  ?  ?  ?  ?  ?  ?  ?  ?  ?  ?  ?  ?   ?Exercises   ? ?  ?Shoulder Instructions   ? ? ?  ?General Comments daughter and husband present throughout session  ? ? ?Pertinent Vitals/ Pain       Pain Assessment ?Pain Assessment: Faces ?Faces Pain Scale: Hurts a little bit ?Pain Location: back ?Pain Descriptors / Indicators: Sore ?Pain Intervention(s): Monitored during session, Repositioned ? ?Home Living   ?  ?  ?  ?  ?  ?  ?  ?  ?  ?  ?  ?  ?  ?  ?  ?  ?  ?  ? ?  ?Prior Functioning/Environment    ?  ?  ?  ?   ? ?Frequency ? Min 2X/week  ? ? ? ? ?  ?Progress Toward Goals ? ?OT Goals(current goals can now be found in the care plan section) ? Progress towards OT goals: Progressing toward goals ? ?Acute Rehab OT Goals ?Patient Stated Goal: get to inpatient rehab ?OT Goal Formulation: With patient/family ?Time For Goal Achievement: 10/16/21 ?Potential to  Achieve Goals: Good ?ADL Goals ?Pt Will Perform Grooming: with modified independence;standing ?Pt Will Perform Lower Body Dressing: with set-up;with adaptive equipment;sit to/from stand ?Pt Will Transfer to Toilet: with modified independence;ambulating ?Pt Will Perform Toileting - Clothing Manipulation and hygiene: with modified independence;sit to/from stand;sitting/lateral leans ?Pt Will Perform Tub/Shower Transfer: ambulating;Shower transfer;with modified independence;shower seat;rolling walker ?Additional ADL Goal #1: Pt will recall 3/3 spinal precautions with independence.  ?Plan Discharge plan remains appropriate   ? ?Co-evaluation ? ? ?   ?  ?  ?  ?  ? ?  ?AM-PAC OT "6 Clicks" Daily Activity     ?Outcome Measure ? ? Help from another person eating meals?: A Little ?Help from another person taking care of personal grooming?: A Little ?Help from another person toileting, which includes using toliet, bedpan, or urinal?: A Lot ?Help from another person bathing (including washing, rinsing, drying)?: A Lot ?Help from another person to put on and taking off regular upper body clothing?: A Little ?Help from another person to put on and taking off regular lower body clothing?: A Lot ?6 Click Score: 15 ? ?  ?End of Session Equipment Utilized During Treatment: Gait belt;Rolling walker (2 wheels);Back brace ? ?OT Visit Diagnosis: Other abnormalities of gait and mobility (  R26.89);Muscle weakness (generalized) (M62.81);Pain ?Pain - part of body:  (back) ?  ?Activity Tolerance Patient tolerated treatment well ?  ?Patient Left with family/visitor present;in bed;with call bell/phone within reach;with bed alarm set ?  ?Nurse Communication Mobility status ?  ? ?   ? ?Time: 1219-7588 ?OT Time Calculation (min): 36 min ? ?Charges: OT General Charges ?$OT Visit: 1 Visit ?OT Treatments ?$Self Care/Home Management : 23-37 mins ? ?Jesse Sans OTR/L ?Acute Rehabilitation Services ?Pager: [phone] ?Office: [phone] ? ?Mckenzie Lawrence  Yaqub Arney ?10/09/2021, 2:22 PM ?

## 2021-10-09 NOTE — Progress Notes (Signed)
Inpatient Rehabilitation Admissions Coordinator  ? ?I met with patient , spouse, daughter and OT at bedside. Second level appeal denied for Cir admit. Patient progressing well with therapy. Family await appeal process completion before determining dispo. ? ?Danne Baxter, RN, MSN ?Rehab Admissions Coordinator ?[phone] ?10/09/2021 12:17 PM ? ?

## 2021-10-09 NOTE — Progress Notes (Signed)
Pt with no charted bm since 4/19, but stated that she has not had one since the day before surgery. Dr. Arnoldo Morale made aware. Pt given miralax, colace, and dulcolax suppository. Pt also stated that at home it is normal for her to go 7-10 without having a bm. Pt educated on importance of bowel motility post-surgery.  ? ?Justice Rocher, RN ? ?

## 2021-10-09 NOTE — Progress Notes (Signed)
Subjective: ?The patient is alert and pleasant.  She is in no apparent distress.  She complains of constipation. ? ?Objective: ?Vital signs in last 24 hours: ?Temp:  [97.6 ?F (36.4 ?C)-98.7 ?F (37.1 ?C)] 97.6 ?F (36.4 ?C) (04/25 0736) ?Pulse Rate:  [70-96] 74 (04/25 0736) ?Resp:  [16-20] 19 (04/25 0317) ?BP: (160-193)/(53-93) 165/65 (04/25 0736) ?SpO2:  [97 %-100 %] 98 % (04/25 0736) ?Estimated body mass index is 32.27 kg/m? as calculated from the following: ?  Height as of this encounter: '5\' 6"'$  (1.676 m). ?  Weight as of this encounter: 90.7 kg. ? ? ?Intake/Output from previous day: ?04/24 0701 - 04/25 0700 ?In: 1361.3 [P.O.:560; I.V.:40; Blood:761.3] ?Out: 850 [Urine:850] ?Intake/Output this shift: ?No intake/output data recorded. ? ?Physical exam the patient is alert and oriented.  Her strength is normal. ? ?Lab Results: ?Recent Labs  ?  10/08/21 ?0431 10/08/21 ?2112 10/09/21 ?0346  ?WBC 10.9*  --  13.0*  ?HGB 6.9* 10.2* 9.9*  ?HCT 20.3* 31.0* 29.6*  ?PLT 326  --  384  ? ?BMET ?Recent Labs  ?  10/06/21 ?1101 10/08/21 ?0431  ?NA 131* 132*  ?K 4.2 4.6  ?CL 105 104  ?CO2 17* 20*  ?GLUCOSE 178* 144*  ?BUN 20 17  ?CREATININE 1.94* 2.01*  ?CALCIUM 7.9* 8.1*  ? ? ?Studies/Results: ?No results found. ? ?Assessment/Plan: ?Postop day #8: I encouraged the patient to mobilize with PT and OT.  We are awaiting rehab placement. ? ?Acute blood loss anemia: The patient's hemoglobin is 9.9 this morning. ? LOS: 8 days  ? ? ? ?[name] ?10/09/2021, 8:05 AM ? ? ? ? ?Patient ID: Mckenzie Lawrence, female   DOB: [birth year], 76 y.o.   MRN: [id number] ? ?

## 2021-10-09 NOTE — Progress Notes (Addendum)
Mobility Specialist: Progress Note ? ? 10/09/21 1626  ?Mobility  ?Activity Ambulated with assistance in hallway  ?Level of Assistance Minimal assist, patient does 75% or more  ?Assistive Device Front wheel walker  ?Distance Ambulated (ft) 60 ft  ?Activity Response Tolerated well  ?$Mobility charge 1 Mobility  ? ?Pt received in bed, reluctant but agreeable to ambulation. Mod I with bed mobility and minA to stand. C/o feeling dizzy upon standing, resolved quickly. 8/10 R hip pain during session as well. Distance limited secondary to pain. Pt back to bed after session with call bell at her side.  ? ?Harrell Gave Evianna Chandran ?Mobility Specialist ?Mobility Specialist [place]: [phone] ?Mobility Specialist [place]: [phone] ? ?

## 2021-10-10 LAB — GLUCOSE, CAPILLARY
Glucose-Capillary: 141 mg/dL — ABNORMAL HIGH (ref 70–99)
Glucose-Capillary: 152 mg/dL — ABNORMAL HIGH (ref 70–99)
Glucose-Capillary: 160 mg/dL — ABNORMAL HIGH (ref 70–99)
Glucose-Capillary: 219 mg/dL — ABNORMAL HIGH (ref 70–99)

## 2021-10-10 NOTE — Progress Notes (Signed)
Physical Therapy Treatment ?Patient Details ?Name: Mckenzie Lawrence ?MRN: [id number] ?DOB: [birth year] ?Today's Date: 10/10/2021 ? ? ?History of Present Illness Pt is a 76 y/o female who presents s/p L4-S1 PLIF on 10/01/2021. PMH signficant for cataracts, COPD, CVA, DM, arthritis, HTN, orthostatic hypotension, prior back surgery, bil shoulder surgery. ? ?  ?PT Comments  ? ? Patient progressing with ambulation distance and activity tolerance, though fatigued quickly.  She reports hopeful for rehab still despite denial for CIR.  Feel she may benefit from STSNF if desired, but if she prefers home will need HHPT.  PT will continue to follow.    ?Recommendations for follow up therapy are one component of a multi-disciplinary discharge planning process, led by the attending physician.  Recommendations may be updated based on patient status, additional functional criteria and insurance authorization. ? ?Follow Up Recommendations ? Skilled nursing-short term rehab (<3 hours/day) ?  ?  ?Assistance Recommended at Discharge Frequent or constant Supervision/Assistance  ?Patient can return home with the following A little help with walking and/or transfers;Assistance with cooking/housework;Assist for transportation;Help with stairs or ramp for entrance ?  ?Equipment Recommendations ? None recommended by PT  ?  ?Recommendations for Other Services   ? ? ?  ?Precautions / Restrictions Precautions ?Precautions: Fall;Back ?Required Braces or Orthoses: Spinal Brace ?Spinal Brace: Lumbar corset;Applied in sitting position  ?  ? ?Mobility ? Bed Mobility ?Overal bed mobility: Needs Assistance ?Bed Mobility: Rolling ?Rolling: Supervision ?Sidelying to sit: Supervision, HOB elevated ?  ?  ?Sit to sidelying: Min guard ?General bed mobility comments: used rail to roll and used HOB elevation to sit herself up, assist to guide feet over EOB to supine ?  ? ?Transfers ?Overall transfer level: Needs assistance ?Equipment used: Rolling walker (2  wheels) ?Transfers: Sit to/from Stand ?Sit to Stand: Min guard, Supervision ?  ?  ?  ?  ?  ?General transfer comment: assist for safety ?  ? ?Ambulation/Gait ?Ambulation/Gait assistance: Min guard ?Gait Distance (Feet): 200 Feet ?Assistive device: Rolling walker (2 wheels) ?Gait Pattern/deviations: Step-through pattern, Decreased stride length, Trunk flexed ?  ?  ?  ?General Gait Details: mild flexion at hips, pt with speed around the unit eager to return to bed, HR 100 bpm, mild SOB noted ? ? ?Stairs ?  ?  ?  ?  ?  ? ? ?Wheelchair Mobility ?  ? ?Modified Rankin (Stroke Patients Only) ?  ? ? ?  ?Balance Overall balance assessment: Needs assistance ?  ?Sitting balance-Leahy Scale: Good ?  ?  ?Standing balance support: Bilateral upper extremity supported ?Standing balance-Leahy Scale: Poor ?Standing balance comment: relies on UE support ?  ?  ?  ?  ?  ?  ?  ?  ?  ?  ?  ?  ? ?  ?Cognition Arousal/Alertness: Awake/alert ?Behavior During Therapy: [facility] for tasks assessed/performed, Agitated ?Overall Cognitive Status: Within Functional Limits for tasks assessed ?  ?  ?  ?  ?  ?  ?  ?  ?  ?  ?  ?  ?  ?  ?  ?  ?  ?  ?  ? ?  ?Exercises   ? ?  ?General Comments   ?  ?  ? ?Pertinent Vitals/Pain Pain Assessment ?Faces Pain Scale: Hurts a little bit ?Pain Location: back ?Pain Descriptors / Indicators: Sore ?Pain Intervention(s): Monitored during session, Repositioned  ? ? ?Home Living   ?  ?  ?  ?  ?  ?  ?  ?  ?  ?   ?  ?  Prior Function    ?  ?  ?   ? ?PT Goals (current goals can now be found in the care plan section) Progress towards PT goals: Progressing toward goals ? ?  ?Frequency ? ? ? Min 5X/week ? ? ? ?  ?PT Plan Discharge plan needs to be updated  ? ? ?Co-evaluation   ?  ?  ?  ?  ? ?  ?AM-PAC PT "6 Clicks" Mobility   ?Outcome Measure ? Help needed turning from your back to your side while in a flat bed without using bedrails?: A Little ?Help needed moving from lying on your back to sitting on the side of a flat bed without  using bedrails?: A Little ?Help needed moving to and from a bed to a chair (including a wheelchair)?: A Little ?Help needed standing up from a chair using your arms (e.g., wheelchair or bedside chair)?: A Little ?Help needed to walk in hospital room?: A Little ?Help needed climbing 3-5 steps with a railing? : Total ?6 Click Score: 16 ? ?  ?End of Session Equipment Utilized During Treatment: Gait belt;Back brace ?Activity Tolerance: Patient tolerated treatment well ?Patient left: in bed;with call bell/phone within reach ?  ?PT Visit Diagnosis: Unsteadiness on feet (R26.81);Pain ?Pain - part of body:  (back) ?  ? ? ?Time: 1454-1510 ?PT Time Calculation (min) (ACUTE ONLY): 16 min ? ?Charges:  $Gait Training: 8-22 mins          ?          ? ?Magda Kiel, PT ?Acute Rehabilitation Services ?ZHYQM:[phone] ?Office:[phone] ?10/10/2021 ? ? ? ?Reginia Naas ?10/10/2021, 5:14 PM ? ?

## 2021-10-10 NOTE — TOC Initial Note (Signed)
Transition of Care (TOC) - Initial/Assessment Note  ? ? ?Patient Details  ?Name: Mckenzie Lawrence ?MRN: [id number] ?Date of Birth: 15-Aug-1945 ? ?Transition of Care (TOC) CM/SW Contact:    ?Vinie Sill, LCSW ?Phone Number: ?10/10/2021, 2:10 PM ? ?Clinical Narrative:                 ? ?CSW spoke with patient's daughter,[name]. CSW introduced self and explained role. Family confirmed disposition plan of short term rehab at SNF. Preferred SNF is [facility] Family requested to only have clinical sent to [facility] at this time. CSW explained the SNF process. Family states no questions at this time. ? ? ?Left voice message and sent referral to [facility] ? ?Called [employer]- requested to start authorization for SNF and PTAR.  ? ?TOC will continue to follow and assist with discharge panning.  ? ? ?Thurmond Butts, MSW, LCSW ?Clinical Social Worker ? ? ? ?Expected Discharge Plan: [place] ?Barriers to Discharge: Ship broker, SNF Pending bed offer ? ? ?Patient Goals and CMS Choice ?  ?  ?  ? ?Expected Discharge Plan and Services ?Expected Discharge Plan: [place] ?  ?  ?  ?Living arrangements for the past 2 months: [place] ?                ?  ?  ?  ?  ?  ?  ?  ?  ?  ?  ? ?Prior Living Arrangements/Services ?Living arrangements for the past 2 months: [place] ?Lives with:: Self, Spouse ?  ?       ?  ?  ?  ?  ? ?Activities of Daily Living ?Home Assistive Devices/Equipment: Eyeglasses, CBG Meter, Blood pressure cuff, Grab bars in shower, Shower chair without back ?ADL Screening (condition at time of admission) ?Patient's cognitive ability adequate to safely complete daily activities?: Yes ?Is the patient deaf or have difficulty hearing?: No ?Does the patient have difficulty seeing, even when wearing glasses/contacts?: No ?Does the patient have difficulty concentrating, remembering, or making decisions?: No ?Patient able to express need for  assistance with ADLs?: Yes ?Does the patient have difficulty dressing or bathing?: Yes ?Independently performs ADLs?: Yes (appropriate for developmental age) ?Does the patient have difficulty walking or climbing stairs?: Yes ?Weakness of Legs: Both ?Weakness of Arms/Hands: None ? ?Permission Sought/Granted ?  ?  ?   ?   ?   ?   ? ?Emotional Assessment ?  ?  ?  ?Orientation: : Oriented to Self, Oriented to Place, Oriented to  Time, Oriented to Situation ?Alcohol / Substance Use: Not Applicable ?Psych Involvement: No (comment) ? ?Admission diagnosis:  Lumbar spinal stenosis [M48.061] ?Patient Active Problem List  ? Diagnosis Date Noted  ? Lumbar spinal stenosis 10/01/2021  ? Situational anxiety 07/08/2019  ? Chronic kidney disease, stage 3 unspecified ([place]) 03/01/2019  ? Edema of lower extremity 03/01/2019  ? Proteinuria 03/01/2019  ? Encounter for orthopedic follow-up care 11/04/2018  ? CAD (coronary artery disease) 10/26/2018  ? Pain in right hand 07/22/2018  ? Degeneration of lumbar intervertebral disc 02/06/2018  ? Scoliosis deformity of spine 02/06/2018  ? Spondylolisthesis, grade 1 02/06/2018  ? Low back pain 02/04/2018  ? Spinal stenosis of lumbar region 02/04/2018  ? Trigger finger 01/26/2018  ? CVA (cerebral vascular accident) ([place]) 03/25/2016  ? OSA (obstructive sleep apnea) 02/15/2016  ? Hyponatremia 09/25/2015  ? Vitamin B12 deficiency 09/12/2015  ? Lacunar stroke of left  subthalamic region [facility]) 09/12/2015  ? Diabetic polyneuropathy associated with diabetes mellitus due to underlying condition ([place]) 09/12/2015  ? Stroke ([place]) 03/15/2015  ? Accelerated hypertension 03/15/2015  ? COPD (chronic obstructive pulmonary disease) ([place]) 09/24/2013  ? Vertigo 09/24/2013  ? Female stress incontinence 02/10/2013  ? Incomplete emptying of bladder 02/10/2013  ? Increased frequency of urination 02/10/2013  ? Urge incontinence 02/10/2013  ? Syncope 12/04/2011  ? Tobacco abuse 12/04/2011  ? Elevated cholesterol   ?  Cataracts, bilateral   ? Hypertension   ? Broken foot   ? PCOS (polycystic ovarian syndrome)   ? Type II diabetes mellitus ([place])   ? Osteopenia   ? ?PCP:  [place] Pink, MD ?Pharmacy:   ?[facility] - [place], [place] - [address] ?[place] [address] ?Phone: [phone] Fax: [phone] ? ?[place] [place] [address] [place] ?[place] [address] ?Phone: [phone] Fax: [phone] ? ? ? ? ?Social Determinants of Health (SDOH) Interventions ?  ? ?Readmission Risk Interventions ?   ? View : No data to display.  ?  ?  ?  ? ? ? ?

## 2021-10-10 NOTE — Progress Notes (Signed)
Mobility Specialist: Progress Note ? ? 10/10/21 1652  ?Mobility  ?Activity Ambulated with assistance in hallway  ?Level of Assistance Contact guard assist, steadying assist  ?Assistive Device Front wheel walker  ?Distance Ambulated (ft) 200 ft  ?Activity Response Tolerated well  ?$Mobility charge 1 Mobility  ? ?Received pt in bed having no complaints and agreeable to mobility. Took x2 attempts to stand but pt able to stand w/o assistance. Asymptomatic throughout ambulation, returned back to bed w/ call bell in reach and all needs met. Family present in the room.  ? ?Mckenzie Lawrence ?Mobility Specialist ?Mobility Specialist [place]: [phone] ?Mobility Specialist [place]: [phone] ? ?

## 2021-10-10 NOTE — Progress Notes (Signed)
Inpatient Rehabilitation Admissions Coordinator   ? ?I have been notified by [employer] and [employer] that appeal /denial has been upheld by [employer]/Medicare appeals. I left voicemail for son - in law, Mckenzie Lawrence and spoke with daughter Mckenzie Lawrence, by phone. We will sign off at this time. Acute team and TOC made aware. ? ?Danne Baxter, RN, MSN ?Rehab Admissions Coordinator ?[phone] ?10/10/2021 2:30 PM ? ?

## 2021-10-10 NOTE — NC FL2 (Signed)
?[place] MEDICAID FL2 LEVEL OF CARE SCREENING TOOL  ?  ? ?IDENTIFICATION  ?Patient Name: ?Mckenzie Lawrence Birthdate: [birth year] Sex: female Admission Date (Current Location): ?10/01/2021  ?[place] and [place] Number: ? [place] ?  Facility and Address:  ?[facility] [place], [place] [address] [place], [place] [address] ?     Provider Number: ?[id number]  ?Attending Physician Name and Address:  ?Newman Pies, MD ? Relative Name and Phone Number:  ?  ?   ?Current Level of Care: ?Hospital Recommended Level of Care: ?[place] Prior Approval Number: ?  ? ?Date Approved/Denied: ?  PASRR Number: ?[id number] ? ?Discharge Plan: ?SNF ?  ? ?Current Diagnoses: ?Patient Active Problem List  ? Diagnosis Date Noted  ? Lumbar spinal stenosis 10/01/2021  ? Situational anxiety 07/08/2019  ? Chronic kidney disease, stage 3 unspecified ([place]) 03/01/2019  ? Edema of lower extremity 03/01/2019  ? Proteinuria 03/01/2019  ? Encounter for orthopedic follow-up care 11/04/2018  ? CAD (coronary artery disease) 10/26/2018  ? Pain in right hand 07/22/2018  ? Degeneration of lumbar intervertebral disc 02/06/2018  ? Scoliosis deformity of spine 02/06/2018  ? Spondylolisthesis, grade 1 02/06/2018  ? Low back pain 02/04/2018  ? Spinal stenosis of lumbar region 02/04/2018  ? Trigger finger 01/26/2018  ? CVA (cerebral vascular accident) ([place]) 03/25/2016  ? OSA (obstructive sleep apnea) 02/15/2016  ? Hyponatremia 09/25/2015  ? Vitamin B12 deficiency 09/12/2015  ? Lacunar stroke of left subthalamic region [facility]) 09/12/2015  ? Diabetic polyneuropathy associated with diabetes mellitus due to underlying condition ([place]) 09/12/2015  ? Stroke ([place]) 03/15/2015  ? Accelerated hypertension 03/15/2015  ? COPD (chronic obstructive pulmonary disease) ([place]) 09/24/2013  ? Vertigo 09/24/2013  ? Female stress incontinence 02/10/2013  ? Incomplete emptying of bladder 02/10/2013  ? Increased frequency of urination 02/10/2013  ? Urge  incontinence 02/10/2013  ? Syncope 12/04/2011  ? Tobacco abuse 12/04/2011  ? Elevated cholesterol   ? Cataracts, bilateral   ? Hypertension   ? Broken foot   ? PCOS (polycystic ovarian syndrome)   ? Type II diabetes mellitus ([place])   ? Osteopenia   ? ? ?Orientation RESPIRATION BLADDER Height & Weight   ?  ?Self, Time, Situation, Place ? Normal External catheter, Incontinent Weight: 199 lb 15.3 oz (90.7 kg) ?Height:  '5\' 6"'$  (167.6 cm)  ?BEHAVIORAL SYMPTOMS/MOOD NEUROLOGICAL BOWEL NUTRITION STATUS  ?    Incontinent Diet (please see discharge summary)  ?AMBULATORY STATUS COMMUNICATION OF NEEDS Skin   ?Limited Assist Verbally Surgical wounds (closed incisiocn, Back) ?  ?  ?  ?    ?     ?     ? ? ?Personal Care Assistance Level of Assistance  ?Bathing, Feeding, Dressing Bathing Assistance: Limited assistance ?Feeding assistance: Independent ?Dressing Assistance: Limited assistance ?   ? ?Functional Limitations Info  ?Sight, Hearing, Speech Sight Info: Adequate ?Hearing Info: Adequate ?Speech Info: Adequate  ? ? ?SPECIAL CARE FACTORS FREQUENCY  ?PT (By licensed PT), OT (By licensed OT)   ?  ?PT Frequency: 5x per week ?OT Frequency: 5x per week ?  ?  ?  ?   ? ? ?Contractures Contractures Info: Not present  ? ? ?Additional Factors Info  ?Code Status, Allergies Code Status Info: FULL ?Allergies Info: Iodine,Shellfish Allergy,Codeine,Morphine Sulfate,Irbesartan,Sulfa Antibiotics ?  ?  ?  ?   ? ?Current Medications (10/10/2021):  This is the current hospital active medication list ?Current Facility-Administered Medications  ?Medication Dose Route Frequency Provider Last Rate Last  Admin  ? 0.9 %  sodium chloride infusion  250 mL Intravenous Continuous Bergman, Meghan D, NP      ? acetaminophen (TYLENOL) tablet 1,000 mg  1,000 mg Oral Q6H Bergman, Meghan D, NP   1,000 mg at 10/10/21 1116  ? Or  ? acetaminophen (TYLENOL) suppository 650 mg  650 mg Rectal Q6H Bergman, Meghan D, NP      ? albuterol (PROVENTIL) (2.5 MG/3ML) 0.083%  nebulizer solution 2.5 mg  2.5 mg Nebulization Q6H PRN Pham, Minh Q, RPH-CPP      ? bisacodyl (DULCOLAX) suppository 10 mg  10 mg Rectal Daily PRN Viona Gilmore D, NP   10 mg at 10/09/21 1302  ? calcitRIOL (ROCALTROL) capsule 0.25 mcg  0.25 mcg Oral Daily Viona Gilmore D, NP   0.25 mcg at 10/10/21 1118  ? Chlorhexidine Gluconate Cloth 2 % PADS 6 each  6 each Topical Daily Newman Pies, MD   6 each at 10/10/21 [phone]  ? docusate sodium (COLACE) capsule 100 mg  100 mg Oral BID Viona Gilmore D, NP   100 mg at 10/10/21 1116  ? haloperidol lactate (HALDOL) injection 2 mg  2 mg Intravenous QHS PRN Viona Gilmore D, NP      ? hydrALAZINE (APRESOLINE) injection 10 mg  10 mg Intravenous Q6H PRN Viona Gilmore D, NP   10 mg at 10/09/21 0330  ? hydrALAZINE (APRESOLINE) tablet 25 mg  25 mg Oral TID Viona Gilmore D, NP   25 mg at 10/10/21 1117  ? hydrOXYzine (VISTARIL) injection 50 mg  50 mg Intramuscular Q6H PRN Newman Pies, MD   50 mg at 10/01/21 2142  ? insulin aspart (novoLOG) injection 0-15 Units  0-15 Units Subcutaneous TID WC Viona Gilmore D, NP   3 Units at 10/10/21 1314  ? insulin aspart (novoLOG) injection 0-5 Units  0-5 Units Subcutaneous QHS Newman Pies, MD   2 Units at 10/01/21 2153  ? magic mouthwash  10 mL Oral QID Viona Gilmore D, NP   10 mL at 10/09/21 2126  ? menthol-cetylpyridinium (CEPACOL) lozenge 3 mg  1 lozenge Oral PRN Patricia Nettle, NP      ? Or  ? phenol (CHLORASEPTIC) mouth spray 1 spray  1 spray Mouth/Throat PRN Viona Gilmore D, NP   1 spray at 10/08/21 0845  ? metoprolol succinate (TOPROL-XL) 24 hr tablet 100 mg  100 mg Oral Daily Bergman, Meghan D, NP   100 mg at 10/10/21 1117  ? mirtazapine (REMERON) tablet 7.5 mg  7.5 mg Oral QHS Bergman, Meghan D, NP   7.5 mg at 10/09/21 2124  ? morphine (PF) 4 MG/ML injection 4 mg  4 mg Intravenous Q2H PRN Viona Gilmore D, NP   4 mg at 10/03/21 2023  ? multivitamin (PROSIGHT) tablet 1 tablet  1 tablet Oral Daily Viona Gilmore  D, NP   1 tablet at 10/10/21 1117  ? nicotine (NICODERM CQ - dosed in mg/24 hours) patch 14 mg  14 mg Transdermal Daily Viona Gilmore D, NP   14 mg at 10/08/21 0076  ? ondansetron (ZOFRAN) tablet 4 mg  4 mg Oral Q6H PRN Viona Gilmore D, NP   4 mg at 10/02/21 1512  ? Or  ? ondansetron (ZOFRAN) injection 4 mg  4 mg Intravenous Q6H PRN Viona Gilmore D, NP   4 mg at 10/07/21 1027  ? oxybutynin (DITROPAN) tablet 5 mg  5 mg Oral QHS Viona Gilmore D, NP   5 mg at 10/09/21 2123  ?  oxyCODONE (Oxy IR/ROXICODONE) immediate release tablet 10 mg  10 mg Oral Q3H PRN Viona Gilmore D, NP   10 mg at 10/05/21 0612  ? oxyCODONE (Oxy IR/ROXICODONE) immediate release tablet 5 mg  5 mg Oral Q3H PRN Viona Gilmore D, NP   5 mg at 10/03/21 1003  ? pantoprazole (PROTONIX) EC tablet 40 mg  40 mg Oral Daily Viona Gilmore D, NP   40 mg at 10/10/21 0951  ? polyethylene glycol (MIRALAX / GLYCOLAX) packet 17 g  17 g Oral Daily Newman Pies, MD   17 g at 10/09/21 0093  ? rosuvastatin (CRESTOR) tablet 5 mg  5 mg Oral Daily Bergman, Meghan D, NP   5 mg at 10/10/21 1116  ? sodium chloride flush (NS) 0.9 % injection 3 mL  3 mL Intravenous Q12H Bergman, Meghan D, NP   3 mL at 10/10/21 1119  ? sodium chloride flush (NS) 0.9 % injection 3 mL  3 mL Intravenous PRN Viona Gilmore D, NP      ? tamsulosin (FLOMAX) capsule 0.4 mg  0.4 mg Oral Daily Bergman, Meghan D, NP   0.4 mg at 10/10/21 1116  ? tiZANidine (ZANAFLEX) tablet 2 mg  2 mg Oral Q8H PRN Viona Gilmore D, NP      ? zolpidem (AMBIEN) tablet 5 mg  5 mg Oral QHS PRN Viona Gilmore D, NP   5 mg at 10/04/21 2059  ? ? ? ?Discharge Medications: ?Please see discharge summary for a list of discharge medications. ? ?Relevant Imaging Results: ? ?Relevant Lab Results: ? ? ?Additional Information ?SSN [id number] Pfizer COVID-19 Vaccine 02/14/2020 , 07/26/2019 , 07/07/2019 ? ?Vinie Sill, LCSW ? ? ? ? ?

## 2021-10-10 NOTE — Progress Notes (Signed)
Subjective: ?The patient is alert and pleasant.  She feels better. ? ?Objective: ?Vital signs in last 24 hours: ?Temp:  [97.6 ?F (36.4 ?C)-98.4 ?F (36.9 ?C)] 97.8 ?F (36.6 ?C) (04/26 9485) ?Pulse Rate:  [74-85] 85 (04/26 0743) ?Resp:  [15-18] 15 (04/26 0743) ?BP: (149-189)/(51-68) 176/53 (04/26 0743) ?SpO2:  [95 %-100 %] 95 % (04/26 0743) ?Estimated body mass index is 32.27 kg/m? as calculated from the following: ?  Height as of this encounter: '5\' 6"'$  (1.676 m). ?  Weight as of this encounter: 90.7 kg. ? ? ?Intake/Output from previous day: ?04/25 0701 - 04/26 0700 ?In: 900 [P.O.:900] ?Out: 1000 [Urine:1000] ?Intake/Output this shift: ?No intake/output data recorded. ? ?Physical exam the patient is alert and pleasant.  Her dressing is clean and dry.  Her lower extremity strength is normal. ? ?Lab Results: ?Recent Labs  ?  10/08/21 ?0431 10/08/21 ?2112 10/09/21 ?0346  ?WBC 10.9*  --  13.0*  ?HGB 6.9* 10.2* 9.9*  ?HCT 20.3* 31.0* 29.6*  ?PLT 326  --  384  ? ?BMET ?Recent Labs  ?  10/08/21 ?0431  ?NA 132*  ?K 4.6  ?CL 104  ?CO2 20*  ?GLUCOSE 144*  ?BUN 17  ?CREATININE 2.01*  ?CALCIUM 8.1*  ? ? ?Studies/Results: ?No results found. ? ?Assessment/Plan: ?Postop day #9: We will continue to mobilize the patient with PT and OT.  We are awaiting the rehab denial appeal. ? LOS: 9 days  ? ? ? ?[name] ?10/10/2021, 8:17 AM ? ? ? ? ?Patient ID: Mckenzie Lawrence, female   DOB: [birth year], 76 y.o.   MRN: [id number] ? ?

## 2021-10-11 LAB — CBC
HCT: 32.3 % — ABNORMAL LOW (ref 36.0–46.0)
Hemoglobin: 10.6 g/dL — ABNORMAL LOW (ref 12.0–15.0)
MCH: 30.5 pg (ref 26.0–34.0)
MCHC: 32.8 g/dL (ref 30.0–36.0)
MCV: 92.8 fL (ref 80.0–100.0)
Platelets: 534 10*3/uL — ABNORMAL HIGH (ref 150–400)
RBC: 3.48 MIL/uL — ABNORMAL LOW (ref 3.87–5.11)
RDW: 14.6 % (ref 11.5–15.5)
WBC: 13.3 10*3/uL — ABNORMAL HIGH (ref 4.0–10.5)
nRBC: 0 % (ref 0.0–0.2)

## 2021-10-11 LAB — GLUCOSE, CAPILLARY
Glucose-Capillary: 158 mg/dL — ABNORMAL HIGH (ref 70–99)
Glucose-Capillary: 154 mg/dL — ABNORMAL HIGH (ref 70–99)

## 2021-10-11 MED ORDER — TIZANIDINE HCL 2 MG PO TABS: 2.0000 mg | ORAL_TABLET | Freq: Three times a day (TID) | ORAL | 0 refills | Status: DC | PRN

## 2021-10-11 MED ORDER — OXYCODONE-ACETAMINOPHEN 5-325 MG PO TABS: 1.0000 | ORAL_TABLET | ORAL | 0 refills | Status: DC | PRN

## 2021-10-11 MED ORDER — TAMSULOSIN HCL 0.4 MG PO CAPS: 0.4000 mg | ORAL_CAPSULE | Freq: Every day | ORAL | 0 refills | Status: DC

## 2021-10-11 MED ORDER — OXYCODONE-ACETAMINOPHEN 5-325 MG PO TABS: 1.0000 | ORAL_TABLET | ORAL | Status: DC | PRN

## 2021-10-11 MED ORDER — DOCUSATE SODIUM 100 MG PO CAPS: 100.0000 mg | ORAL_CAPSULE | Freq: Two times a day (BID) | ORAL | 0 refills | Status: DC

## 2021-10-11 NOTE — TOC Progression Note (Signed)
Transition of Care (TOC) - Progression Note  ? ? ?Patient Details  ?Name: Mckenzie Lawrence ?MRN: [id number] ?Date of Birth: 1946-03-20 ? ?Transition of Care (TOC) CM/SW Contact  ?Angelita Ingles, RN ?Phone Number: ?10/11/2021, 12:01 PM ? ?Clinical Narrative:    ? ? ? ?Expected Discharge Plan: [place] ?Barriers to Discharge: Ship broker, SNF Pending bed offer ? ?Expected Discharge Plan and Services ?Expected Discharge Plan: [place] ?  ?  ?  ?Living arrangements for the past 2 months: [place] ?Expected Discharge Date: 10/11/21               ?  ?  ?  ?  ?  ?  ?  ?  ?  ?  ? ? ?Social Determinants of Health (SDOH) Interventions ?  ? ?Readmission Risk Interventions ?   ? View : No data to display.  ?  ?  ?  ? ? ?

## 2021-10-11 NOTE — TOC Transition Note (Addendum)
Transition of Care (TOC) - CM/SW Discharge Note ? ? ?Patient Details  ?Name: BRAEDYN RIGGLE ?MRN: [id number] ?Date of Birth: [birth year] ? ?Transition of Care (TOC) CM/SW Contact:  ?Angelita Ingles, RN ?Phone Number:[phone] ? ?10/11/2021, 12:01 PM ? ? ?Clinical Narrative:    ?Patient  has recommendation to discharge home with home health therapies. CM at bedside to offer choice for home health agency. Family does not have a preference as long as insurance will cover. Home health referral called to [facility] with [place] care. Referral pending.  ? ?1208 2nd Home health referral sent to [facility] with [place]. Referral pending.  ? ?1252 CM just received message from nurse that patient was prearranged for [facility] services. CM spoke with Amy at [facility] to verify. Patient has been previously arranged for home health services with [facility]. Family and patient are aware. No other needs noted at this time. TOC will sign off. ? ?Final next level of care: [place] ?Barriers to Discharge: Ship broker, SNF Pending bed offer ? ? ?Patient Goals and CMS Choice ?  ?  ?  ? ?Discharge Placement ?  ?           ?  ?  ?  ?  ? ?Discharge Plan and Services ?  ?  ?           ?  ?  ?  ?  ?  ?  ?  ?  ?  ?  ? ?Social Determinants of Health (SDOH) Interventions ?  ? ? ?Readmission Risk Interventions ?   ? View : No data to display.  ?  ?  ?  ? ? ? ? ? ?

## 2021-10-11 NOTE — Progress Notes (Signed)
Physical Therapy Treatment ?Patient Details ?Name: NIYLAH HASSAN ?MRN: [id number] ?DOB: 17-Apr-1946 ?Today's Date: 10/11/2021 ? ? ?History of Present Illness Pt is a 76 y/o female who presents s/p L4-S1 PLIF on 10/01/2021. PMH signficant for cataracts, COPD, CVA, DM, arthritis, HTN, orthostatic hypotension, prior back surgery, bil shoulder surgery. ? ?  ?PT Comments  ? ? Patient progressing with mobility and family in the room.  Assisted with education on stairs and level of assistance for transfers, etc with family able to verbalize understanding and return demonstrate assist on stairs.  She  was able to walk a good distance, then transported to stairs via recliner and able to negotiate stairs with PT then caregiver assistance.  Feel she is stable for home with follow up HHPT.   ?Recommendations for follow up therapy are one component of a multi-disciplinary discharge planning process, led by the attending physician.  Recommendations may be updated based on patient status, additional functional criteria and insurance authorization. ? ?Follow Up Recommendations ? Home health PT ?  ?  ?Assistance Recommended at Discharge Frequent or constant Supervision/Assistance  ?Patient can return home with the following A little help with walking and/or transfers;Assistance with cooking/housework;Assist for transportation;Help with stairs or ramp for entrance ?  ?Equipment Recommendations ?    ?  ?Recommendations for Other Services   ? ? ?  ?Precautions / Restrictions Precautions ?Precautions: Fall;Back ?Precaution Booklet Issued: Yes (comment) ?Precaution Comments: recalling 3/3 back precautions ?Required Braces or Orthoses: Spinal Brace ?Spinal Brace: Lumbar corset;Applied in sitting position ?Restrictions ?Weight Bearing Restrictions: No  ?  ? ?Mobility ? Bed Mobility ?  ?  ?  ?  ?  ?  ?  ?General bed mobility comments: in chair, discussed with family pt using HOB elevation to help lift trunk, they state they have placed rail on  her bed at home ?  ? ?Transfers ?Overall transfer level: Needs assistance ?Equipment used: Rolling walker (2 wheels) ?Transfers: Sit to/from Stand ?Sit to Stand: Supervision, Min guard ?  ?  ?  ?  ?  ?General transfer comment: initial assist for transition, then able to perfom unaided after multiple sit to stands ?  ? ?Ambulation/Gait ?Ambulation/Gait assistance: Supervision, Min guard ?Gait Distance (Feet): 100 Feet ?Assistive device: Rolling walker (2 wheels) ?Gait Pattern/deviations: Step-through pattern, Decreased stride length, Trunk flexed ?  ?  ?  ?General Gait Details: cues for shoulders over hips, assist for safety ? ? ?Stairs ?Stairs: Yes ?Stairs assistance: Min guard ?Stair Management: Two rails, Step to pattern, Forwards ?Number of Stairs: 2 (x 3 with seated rest breaks in between) ?General stair comments: initially with PT assist and cues for sequence, spouse present and educated in level of assist then spouse able to return demonstrate safe technique ? ? ?Wheelchair Mobility ?  ? ?Modified Rankin (Stroke Patients Only) ?  ? ? ?  ?Balance Overall balance assessment: Needs assistance ?Sitting-balance support: No upper extremity supported ?Sitting balance-Leahy Scale: Good ?  ?  ?Standing balance support: Bilateral upper extremity supported ?Standing balance-Leahy Scale: Poor ?Standing balance comment: relies on UE support ?  ?  ?  ?  ?  ?  ?  ?  ?  ?  ?  ?  ? ?  ?Cognition Arousal/Alertness: Awake/alert ?Behavior During Therapy: [facility] for tasks assessed/performed, Agitated ?Overall Cognitive Status: Within Functional Limits for tasks assessed ?  ?  ?  ?  ?  ?  ?  ?  ?  ?  ?  ?  ?  ?  ?  ?  ?  ?  ?  ? ?  ?  Exercises   ? ?  ?General Comments General comments (skin integrity, edema, etc.): Issued gait belt for home use and demonstrated to spouse and daughter and son; discussed plans for d/c and recommendations for HHPT and aide ?  ?  ? ?Pertinent Vitals/Pain Pain Assessment ?Pain Score: 5  ?Pain Location:  back, incision site ?Pain Descriptors / Indicators: Sore ?Pain Intervention(s): Monitored during session, Repositioned  ? ? ?Home Living   ?  ?  ?  ?  ?  ?  ?  ?  ?  ?   ?  ?Prior Function    ?  ?  ?   ? ?PT Goals (current goals can now be found in the care plan section) Progress towards PT goals: Progressing toward goals ? ?  ?Frequency ? ? ?   ? ? ? ?  ?PT Plan Discharge plan needs to be updated  ? ? ?Co-evaluation   ?  ?  ?  ?  ? ?  ?AM-PAC PT "6 Clicks" Mobility   ?Outcome Measure ? Help needed turning from your back to your side while in a flat bed without using bedrails?: A Little ?Help needed moving from lying on your back to sitting on the side of a flat bed without using bedrails?: A Little ?Help needed moving to and from a bed to a chair (including a wheelchair)?: A Little ?Help needed standing up from a chair using your arms (e.g., wheelchair or bedside chair)?: A Little ?Help needed to walk in hospital room?: A Little ?Help needed climbing 3-5 steps with a railing? : A Little ?6 Click Score: 18 ? ?  ?End of Session Equipment Utilized During Treatment: Gait belt;Back brace ?Activity Tolerance: Patient tolerated treatment well ?Patient left: in chair;with call bell/phone within reach ?  ?PT Visit Diagnosis: Unsteadiness on feet (R26.81);Pain ?Pain - part of body:  (back) ?  ? ? ?Time: 1950-9326 ?PT Time Calculation (min) (ACUTE ONLY): 26 min ? ?Charges:  $Gait Training: 8-22 mins ?$Self Care/Home Management: 8-22          ?          ? ?Magda Kiel, PT ?Acute Rehabilitation Services ?ZTIWP:[phone] ?[phone] ?10/11/2021 ? ? ? ?Reginia Naas ?10/11/2021, 12:22 PM ? ?

## 2021-10-11 NOTE — TOC Progression Note (Addendum)
Transition of Care (TOC) - Progression Note  ? ? ?Patient Details  ?Name: Mckenzie Lawrence ?MRN: [id number] ?Date of Birth: [birth year] ? ?Transition of Care (TOC) CM/SW Contact  ?Vinie Sill, LCSW ?Phone Number: ?10/11/2021, 11:19 AM ? ?Clinical Narrative:    ? ? ?CSW, Agricultural consultant and  RNCM met with family to clarify disposition. Family states it is theirs and the patient's desire is to discharge home,however, they want to make sure patient is strong enough to return home with support. If not, family want to pursue SNF. PT is currently working with patient- TOC will wait on updated clinicals and address the needs of family as requested.  ? ?CSW informed family, -TOC has  not received confirmation form Engineer, water or insurance auth for SNF at this time but will follow up with SNF this morning.   ? ?CSW called [employer] , left voice message to return call ? ?Thurmond Butts, MSW, LCSW ?Clinical Social Worker ? ? ? ?Expected Discharge Plan: [place] ?Barriers to Discharge: Ship broker, SNF Pending bed offer ? ?Expected Discharge Plan and Services ?Expected Discharge Plan: [place] ?  ?  ?  ?Living arrangements for the past 2 months: [place] ?Expected Discharge Date: 10/11/21               ?  ?  ?  ?  ?  ?  ?  ?  ?  ?  ? ? ?Social Determinants of Health (SDOH) Interventions ?  ? ?Readmission Risk Interventions ?   ? View : No data to display.  ?  ?  ?  ? ? ?

## 2021-10-11 NOTE — Discharge Summary (Addendum)
Physician Discharge Summary  ?Patient ID: ?Mckenzie Lawrence ?MRN: [id number] ?DOB/AGE: 09/21/1945 76 y.o. ? ?Admit date: 10/01/2021 ?Discharge date: 10/11/2021 ? ?Admission Diagnoses: Lumbar spondylolisthesis, lumbar spinal stenosis, lumbar herniated disc, lumbar radiculopathy, lumbago, neurogenic claudication ? ?Discharge Diagnoses: The same ?Principal Problem: ?  Lumbar spinal stenosis ? ? ?Discharged Condition: good ? ?Hospital Course: I performed a L4-5 and L5-S1 instrumentation and fusion with L3-4 discectomy on the patient on 10/01/2021.  The surgery went well. ? ?The patient's postoperative course was remarkable for increasing renal insufficiency after surgery.  Renal saw the patient.  This resolved. ? ?She also had acute blood loss anemia on top of her chronic anemia.  She was transfused 2 units of blood. ? ?The patient was slow to mobilize.  PT and OT saw the patient.  We had rehab see the patient.  Inpatient rehab stayed was denied despite multiple appeals.  The patient declined skilled nurse facility placement. ? ?On 10/11/2021 the patient is ready for discharge home.  Arrangements were made for home PT and OT.  She was given verbal and written discharge instructions.  All their questions were answered. ? ?Consults: PT, OT, care management, rehab, renal ?Significant Diagnostic Studies: None ?Treatments: L4-5 and L5-S1 instrumentation and fusion, L3-4 discectomy ?Discharge Exam: ?Blood pressure (!) 191/63, pulse 70, temperature 97.7 ?F (36.5 ?C), temperature source Oral, resp. rate 15, height '5\' 6"'$  (1.676 m), weight 90.7 kg, SpO2 96 %. ?The patient is alert and pleasant.  Her strength is normal.  Her wound is healing well. ? ?Disposition: Home with PT and OT. ? ?Discharge Instructions   ? ? Call MD for:  difficulty breathing, headache or visual disturbances   Complete by: As directed ?  ? Call MD for:  extreme fatigue   Complete by: As directed ?  ? Call MD for:  hives   Complete by: As directed ?  ? Call MD for:   persistant dizziness or light-headedness   Complete by: As directed ?  ? Call MD for:  persistant nausea and vomiting   Complete by: As directed ?  ? Call MD for:  redness, tenderness, or signs of infection (pain, swelling, redness, odor or green/yellow discharge around incision site)   Complete by: As directed ?  ? Call MD for:  severe uncontrolled pain   Complete by: As directed ?  ? Call MD for:  temperature >100.4   Complete by: As directed ?  ? Diet - low sodium heart healthy   Complete by: As directed ?  ? Discharge instructions   Complete by: As directed ?  ? Call [phone] for a followup appointment. Take a stool softener while you are using pain medications.  ? Driving Restrictions   Complete by: As directed ?  ? Do not drive for 2 weeks.  ? Increase activity slowly   Complete by: As directed ?  ? Lifting restrictions   Complete by: As directed ?  ? Do not lift more than 5 pounds. No excessive bending or twisting.  ? May shower / Bathe   Complete by: As directed ?  ? Remove the dressing for 3 days after surgery.  You may shower, but leave the incision alone.  ? No dressing needed   Complete by: As directed ?  ? ?  ? ?Allergies as of 10/11/2021   ? ?   Reactions  ? Iodine Anaphylaxis, Other (See Comments)  ? Pt states that she is allergic to ingested iodine only, okay for betadine.    ?  Shellfish Allergy Anaphylaxis  ? Codeine Nausea And Vomiting  ? Morphine Sulfate Nausea And Vomiting  ? Irbesartan Other (See Comments)  ? Reaction:  Unknown  (Avapro)  ? Sulfa Antibiotics Other (See Comments)  ? Fever   ? ?  ? ?  ?Medication List  ?  ? ?STOP taking these medications   ? ?diazepam 10 MG tablet ?Commonly known as: VALIUM ?  ?zolpidem 10 MG tablet ?Commonly known as: AMBIEN ?  ? ?  ? ?TAKE these medications   ? ?acetaminophen 500 MG tablet ?Commonly known as: TYLENOL ?Take 1,000 mg by mouth every 6 (six) hours as needed for moderate pain or headache. ?  ?albuterol (2.5 MG/3ML) 0.083% nebulizer  solution ?Commonly known as: PROVENTIL ?Take 3 mLs (2.5 mg total) by nebulization every 6 (six) hours as needed for wheezing or shortness of breath. ?  ?albuterol 108 (90 Base) MCG/ACT inhaler ?Commonly known as: VENTOLIN HFA ?Inhale 2 puffs into the lungs every 6 (six) hours as needed for wheezing or shortness of breath. ?  ?Anoro Ellipta 62.5-25 MCG/ACT Aepb ?Generic drug: umeclidinium-vilanterol ?SMARTSIG:1 Inhalation Via Inhaler Daily ?  ?BD Insulin Syringe U/F 31G X 5/16" 1 ML Misc ?Generic drug: Insulin Syringe-Needle U-100 ?USE 1 SYRINGE AS DIRECTED ?  ?calcitRIOL 0.25 MCG capsule ?Commonly known as: ROCALTROL ?Take 0.25 mcg by mouth daily. ?  ?clopidogrel 75 MG tablet ?Commonly known as: PLAVIX ?Take 75 mg by mouth daily. ?  ?docusate sodium 100 MG capsule ?Commonly known as: COLACE ?Take 1 capsule (100 mg total) by mouth 2 (two) times daily. ?  ?hydrALAZINE 25 MG tablet ?Commonly known as: APRESOLINE ?Take 25 mg by mouth 3 (three) times daily. ?  ?insulin detemir 100 UNIT/ML injection ?Commonly known as: LEVEMIR ?Inject 30-60 Units into the skin daily. ?  ?LORazepam 2 MG tablet ?Commonly known as: ATIVAN ?Take 0.5 mg by mouth every 8 (eight) hours as needed for anxiety. ?  ?losartan 100 MG tablet ?Commonly known as: COZAAR ?Take 1 tablet (100 mg total) by mouth daily. ?  ?metoprolol succinate 100 MG 24 hr tablet ?Commonly known as: TOPROL-XL ?Take 100 mg by mouth daily. ?  ?mirtazapine 7.5 MG tablet ?Commonly known as: REMERON ?Take 7.5 mg by mouth at bedtime. ?  ?OneTouch Ultra test strip ?Generic drug: glucose blood ?4 (four) times daily. ?  ?OVER THE COUNTER MEDICATION ?Take 2 tablets by mouth daily. Cranberry complex ?  ?oxybutynin 5 MG tablet ?Commonly known as: DITROPAN ?Take 5 mg by mouth at bedtime. ?  ?oxyCODONE-acetaminophen 5-325 MG tablet ?Commonly known as: Percocet ?Take 1 tablet by mouth every 4 (four) hours as needed for severe pain. ?What changed:  ?when to take this ?reasons to take this ?   ?pantoprazole 40 MG tablet ?Commonly known as: PROTONIX ?Take 40 mg by mouth daily. ?  ?promethazine 25 MG tablet ?Commonly known as: PHENERGAN ?Take 25 mg by mouth every 6 (six) hours as needed for nausea/vomiting. ?  ?rosuvastatin 5 MG tablet ?Commonly known as: CRESTOR ?Take 5 mg by mouth daily. ?  ?tamsulosin 0.4 MG Caps capsule ?Commonly known as: FLOMAX ?Take 1 capsule (0.4 mg total) by mouth daily. ?  ?tiZANidine 2 MG tablet ?Commonly known as: ZANAFLEX ?Take 1 tablet (2 mg total) by mouth every 8 (eight) hours as needed for muscle spasms. ?  ?torsemide 20 MG tablet ?Commonly known as: DEMADEX ?Take 40 mg by mouth daily as needed (fluid). ?  ?VISION VITAMINS PO ?Take 1 capsule by mouth daily. AREDS ?  ? ?  ? ?  ?  ? ? ?  ?  Discharge Care Instructions  ?(From admission, onward)  ?  ? ? ?  ? ?  Start     Ordered  ? 10/11/21 0000  No dressing needed       ? 10/11/21 0804  ? ?  ?  ? ?  ? ? Follow-up Information   ? ? [place]. Follow up.   ?Why: [facility] ?Contact information: ?[address] ?[place] [address] ?[phone] ? ? ?  ?  ? ?  ?  ? ?  ? ? ?Signed: ?[name] ?10/11/2021, 8:05 AM ? ? ? ? ?

## 2021-10-11 NOTE — Progress Notes (Signed)
Pt discharged home. Daughter, husband, and son-in-law all at bedside and agreeable to this. PIV removed and all room belongings packed. Extra ABD pads and tape sent with family in case incision has drainage. Pt and daughter educated on all discharge information with no questions at this time.  ? ?Justice Rocher, RN ? ?

## 2021-10-11 NOTE — Progress Notes (Signed)
Occupational Therapy Treatment ?Patient Details ?Name: Mckenzie Lawrence ?MRN: [id number] ?DOB: Feb 24, 1946 ?Today's Date: 10/11/2021 ? ?Clinical Impression: Pt making good progress towards OT goals this session. Able to demonstrate toilet transfer at min guard assist, peri care with set up, grooming at sink level, and ambulation with RW. Pt anxious, but did well talking through potential dc options. At the end of the session, Pt/family/therapist in agreement that the best setting for her to dc to his home with HHOT to maximize safety and independence in ADL and functional transfers.  ? ? ? 10/11/21 1100  ?OT Visit Information  ?Last OT Received On 10/11/21  ?Assistance Needed +1  ?History of Present Illness Pt is a 76 y/o female who presents s/p L4-S1 PLIF on 10/01/2021. PMH signficant for cataracts, COPD, CVA, DM, arthritis, HTN, orthostatic hypotension, prior back surgery, bil shoulder surgery.  ?Precautions  ?Precautions Fall;Back  ?Precaution Booklet Issued Yes (comment)  ?Precaution Comments recalling 3/3 back precautions  ?Required Braces or Orthoses Spinal Brace  ?Spinal Brace Lumbar corset;Applied in sitting position  ?Restrictions  ?Weight Bearing Restrictions No  ?Pain Assessment  ?Pain Assessment 0-10  ?Pain Score 5  ?Pain Location back, incision site  ?Pain Descriptors / Indicators Sore  ?Pain Intervention(s) Monitored during session;Repositioned  ?Cognition  ?Arousal/Alertness Awake/alert  ?Behavior During Therapy [facility] for tasks assessed/performed;Agitated  ?Overall Cognitive Status Within Functional Limits for tasks assessed  ?Upper Extremity Assessment  ?Upper Extremity Assessment Generalized weakness  ?Lower Extremity Assessment  ?Lower Extremity Assessment Defer to PT evaluation  ?Vision- Assessment  ?Vision Assessment? No apparent visual deficits  ?Praxis  ?Praxis Not tested  ?ADL  ?Overall ADL's  Needs assistance/impaired  ?Grooming Wash/dry hands;Min guard;Standing;Oral care;Wash/dry face  ?Grooming  Details (indicate cue type and reason) decreased activity tolerance for standing grooming  ?Upper Body Dressing  Minimal assistance;Sitting  ?Upper Body Dressing Details (indicate cue type and reason) brace education, min A for don/doff, vc for reminders of sequencing of brace  ?Toilet Transfer Ambulation;Rolling walker (2 wheels);Min guard  ?Toilet Transfer Details (indicate cue type and reason) initially Pt requiring multiple attempts to perform sit<>stand, improved with each transfer  ?Toileting- Clothing Manipulation and Hygiene Sit to/from stand;Minimal assistance  ?Functional mobility during ADLs Rolling walker (2 wheels);Cueing for safety;Min guard  ?General ADL Comments Pt making good progress towards OT goals, focued on toilet transfer, standing grooming, brace management  ?Bed Mobility  ?General bed mobility comments Pt OOB in recliner at beginning and end of session  ?Transfers  ?Overall transfer level Needs assistance  ?Equipment used Rolling walker (2 wheels)  ?Transfers Sit to/from Stand  ?Sit to Stand Min guard  ?General transfer comment assist for safety  ?Balance  ?Overall balance assessment Needs assistance  ?Sitting-balance support No upper extremity supported  ?Sitting balance-Leahy Scale Good  ?Standing balance support Bilateral upper extremity supported  ?Standing balance-Leahy Scale Poor  ?Standing balance comment relies on UE support  ?General Comments  ?General comments (skin integrity, edema, etc.) daughter, son-in-law, and husband present throughout session  ?OT - End of Session  ?Equipment Utilized During Treatment Gait belt;Rolling walker (2 wheels);Back brace  ?Activity Tolerance Patient tolerated treatment well  ?Patient left with family/visitor present;with call bell/phone within reach;in chair  ?Nurse Communication Mobility status  ?OT Assessment/Plan  ?OT Plan Discharge plan needs to be updated;Equipment recommendations need to be updated  ?OT Visit Diagnosis Other abnormalities of  gait and mobility (R26.89);Muscle weakness (generalized) (M62.81);Pain  ?Pain - Right/Left  ?(central)  ?Pain - part of body  ?(  back)  ?OT Frequency (ACUTE ONLY) Min 2X/week  ?Recommendations for Other Services PT consult  ?Follow Up Recommendations Home health OT  ?Assistance recommended at discharge Frequent or constant Supervision/Assistance  ?Patient can return home with the following A little help with walking and/or transfers;A lot of help with bathing/dressing/bathroom;Assistance with cooking/housework;Assist for transportation;Help with stairs or ramp for entrance  ?OT Equipment Other (comment) ?(defer to next venue of care)  ?AM-PAC OT "6 Clicks" Daily Activity Outcome Measure (Version 2)  ?Help from another person eating meals? 3  ?Help from another person taking care of personal grooming? 3  ?Help from another person toileting, which includes using toliet, bedpan, or urinal? 2  ?Help from another person bathing (including washing, rinsing, drying)? 2  ?Help from another person to put on and taking off regular upper body clothing? 3  ?Help from another person to put on and taking off regular lower body clothing? 2  ?6 Click Score 15  ?Progressive Mobility  ?What is the highest level of mobility based on the progressive mobility assessment? Level 5 (Walks with assist in room/hall) - Balance while stepping forward/back and can walk in room with assist - Complete  ?Activity Ambulated with assistance in hallway  ?OT Goal Progression  ?Progress towards OT goals Progressing toward goals  ?Acute Rehab OT Goals  ?Patient Stated Goal be safe at dc  ?OT Goal Formulation With patient  ?Time For Goal Achievement 10/16/21  ?Potential to Achieve Goals Good  ?OT Time Calculation  ?OT Start Time (ACUTE ONLY) 1025  ?OT Stop Time (ACUTE ONLY) 1103  ?OT Time Calculation (min) 38 min  ?OT General Charges  ?$OT Visit 1 Visit  ?OT Treatments  ?$Self Care/Home Management  23-37 mins  ?Perception  ?Perception Not tested  ? ?Jesse Sans OTR/L ?Acute Rehabilitation Services ?Pager: [phone] ?Office: [phone] ? ?

## 2021-10-12 DIAGNOSIS — E119 Type 2 diabetes mellitus without complications: Secondary | ICD-10-CM | POA: Diagnosis not present

## 2021-10-12 DIAGNOSIS — I10 Essential (primary) hypertension: Secondary | ICD-10-CM | POA: Diagnosis not present

## 2021-10-12 DIAGNOSIS — Z4789 Encounter for other orthopedic aftercare: Secondary | ICD-10-CM | POA: Diagnosis not present

## 2021-10-12 DIAGNOSIS — R2681 Unsteadiness on feet: Secondary | ICD-10-CM | POA: Diagnosis not present

## 2021-10-12 DIAGNOSIS — D649 Anemia, unspecified: Secondary | ICD-10-CM | POA: Diagnosis not present

## 2021-10-15 DIAGNOSIS — I10 Essential (primary) hypertension: Secondary | ICD-10-CM | POA: Diagnosis not present

## 2021-10-15 DIAGNOSIS — R2681 Unsteadiness on feet: Secondary | ICD-10-CM | POA: Diagnosis not present

## 2021-10-15 DIAGNOSIS — K859 Acute pancreatitis without necrosis or infection, unspecified: Secondary | ICD-10-CM

## 2021-10-15 DIAGNOSIS — D649 Anemia, unspecified: Secondary | ICD-10-CM | POA: Diagnosis not present

## 2021-10-15 DIAGNOSIS — E119 Type 2 diabetes mellitus without complications: Secondary | ICD-10-CM | POA: Diagnosis not present

## 2021-10-15 DIAGNOSIS — Z4789 Encounter for other orthopedic aftercare: Secondary | ICD-10-CM | POA: Diagnosis not present

## 2021-10-15 HISTORY — DX: Acute pancreatitis without necrosis or infection, unspecified: K85.90

## 2021-10-23 DIAGNOSIS — M4317 Spondylolisthesis, lumbosacral region: Secondary | ICD-10-CM | POA: Diagnosis not present

## 2021-10-25 DIAGNOSIS — R300 Dysuria: Secondary | ICD-10-CM | POA: Diagnosis not present

## 2021-11-02 DIAGNOSIS — R8271 Bacteriuria: Secondary | ICD-10-CM | POA: Diagnosis not present

## 2021-11-02 DIAGNOSIS — N3020 Other chronic cystitis without hematuria: Secondary | ICD-10-CM | POA: Diagnosis not present

## 2021-11-02 DIAGNOSIS — N3000 Acute cystitis without hematuria: Secondary | ICD-10-CM | POA: Diagnosis not present

## 2021-11-06 DIAGNOSIS — Z794 Long term (current) use of insulin: Secondary | ICD-10-CM | POA: Diagnosis not present

## 2021-11-06 DIAGNOSIS — E118 Type 2 diabetes mellitus with unspecified complications: Secondary | ICD-10-CM | POA: Diagnosis not present

## 2021-11-06 DIAGNOSIS — R7989 Other specified abnormal findings of blood chemistry: Secondary | ICD-10-CM | POA: Diagnosis not present

## 2021-11-06 DIAGNOSIS — I10 Essential (primary) hypertension: Secondary | ICD-10-CM | POA: Diagnosis not present

## 2021-11-06 DIAGNOSIS — D649 Anemia, unspecified: Secondary | ICD-10-CM | POA: Diagnosis not present

## 2021-11-09 ENCOUNTER — Inpatient Hospital Stay
Admission: EM | Admit: 2021-11-09 | Discharge: 2021-11-12 | DRG: 439 | Disposition: A | Discharge status: Home Health | Payer: HMO | Attending: Internal Medicine | Admitting: Internal Medicine

## 2021-11-09 ENCOUNTER — Emergency Department: Payer: HMO

## 2021-11-09 ENCOUNTER — Other Ambulatory Visit: Payer: Self-pay

## 2021-11-09 DIAGNOSIS — G4733 Obstructive sleep apnea (adult) (pediatric): Secondary | ICD-10-CM | POA: Diagnosis not present

## 2021-11-09 DIAGNOSIS — R5381 Other malaise: Secondary | ICD-10-CM | POA: Diagnosis not present

## 2021-11-09 DIAGNOSIS — Z7902 Long term (current) use of antithrombotics/antiplatelets: Secondary | ICD-10-CM | POA: Diagnosis not present

## 2021-11-09 DIAGNOSIS — N390 Urinary tract infection, site not specified: Secondary | ICD-10-CM

## 2021-11-09 DIAGNOSIS — K838 Other specified diseases of biliary tract: Secondary | ICD-10-CM | POA: Diagnosis not present

## 2021-11-09 DIAGNOSIS — R1111 Vomiting without nausea: Secondary | ICD-10-CM | POA: Diagnosis not present

## 2021-11-09 DIAGNOSIS — T466X5A Adverse effect of antihyperlipidemic and antiarteriosclerotic drugs, initial encounter: Secondary | ICD-10-CM | POA: Diagnosis present

## 2021-11-09 DIAGNOSIS — E1169 Type 2 diabetes mellitus with other specified complication: Secondary | ICD-10-CM | POA: Diagnosis present

## 2021-11-09 DIAGNOSIS — Z885 Allergy status to narcotic agent status: Secondary | ICD-10-CM | POA: Diagnosis not present

## 2021-11-09 DIAGNOSIS — Z794 Long term (current) use of insulin: Secondary | ICD-10-CM

## 2021-11-09 DIAGNOSIS — R112 Nausea with vomiting, unspecified: Principal | ICD-10-CM

## 2021-11-09 DIAGNOSIS — Z91041 Radiographic dye allergy status: Secondary | ICD-10-CM | POA: Diagnosis not present

## 2021-11-09 DIAGNOSIS — E785 Hyperlipidemia, unspecified: Secondary | ICD-10-CM | POA: Diagnosis present

## 2021-11-09 DIAGNOSIS — K859 Acute pancreatitis without necrosis or infection, unspecified: Secondary | ICD-10-CM | POA: Diagnosis present

## 2021-11-09 DIAGNOSIS — Z87892 Personal history of anaphylaxis: Secondary | ICD-10-CM

## 2021-11-09 DIAGNOSIS — I129 Hypertensive chronic kidney disease with stage 1 through stage 4 chronic kidney disease, or unspecified chronic kidney disease: Secondary | ICD-10-CM | POA: Diagnosis present

## 2021-11-09 DIAGNOSIS — I10 Essential (primary) hypertension: Secondary | ICD-10-CM | POA: Diagnosis present

## 2021-11-09 DIAGNOSIS — E1122 Type 2 diabetes mellitus with diabetic chronic kidney disease: Secondary | ICD-10-CM | POA: Diagnosis present

## 2021-11-09 DIAGNOSIS — K8689 Other specified diseases of pancreas: Secondary | ICD-10-CM | POA: Diagnosis not present

## 2021-11-09 DIAGNOSIS — Z90710 Acquired absence of both cervix and uterus: Secondary | ICD-10-CM

## 2021-11-09 DIAGNOSIS — E1129 Type 2 diabetes mellitus with other diabetic kidney complication: Secondary | ICD-10-CM

## 2021-11-09 DIAGNOSIS — Z882 Allergy status to sulfonamides status: Secondary | ICD-10-CM | POA: Diagnosis not present

## 2021-11-09 DIAGNOSIS — I700 Atherosclerosis of aorta: Secondary | ICD-10-CM | POA: Diagnosis not present

## 2021-11-09 DIAGNOSIS — Z7982 Long term (current) use of aspirin: Secondary | ICD-10-CM

## 2021-11-09 DIAGNOSIS — Z7951 Long term (current) use of inhaled steroids: Secondary | ICD-10-CM

## 2021-11-09 DIAGNOSIS — N184 Chronic kidney disease, stage 4 (severe): Secondary | ICD-10-CM | POA: Diagnosis present

## 2021-11-09 DIAGNOSIS — K853 Drug induced acute pancreatitis without necrosis or infection: Principal | ICD-10-CM | POA: Diagnosis present

## 2021-11-09 DIAGNOSIS — Z8673 Personal history of transient ischemic attack (TIA), and cerebral infarction without residual deficits: Secondary | ICD-10-CM

## 2021-11-09 DIAGNOSIS — Z888 Allergy status to other drugs, medicaments and biological substances status: Secondary | ICD-10-CM | POA: Diagnosis not present

## 2021-11-09 DIAGNOSIS — I2510 Atherosclerotic heart disease of native coronary artery without angina pectoris: Secondary | ICD-10-CM | POA: Diagnosis present

## 2021-11-09 DIAGNOSIS — Z833 Family history of diabetes mellitus: Secondary | ICD-10-CM

## 2021-11-09 DIAGNOSIS — Z79899 Other long term (current) drug therapy: Secondary | ICD-10-CM

## 2021-11-09 DIAGNOSIS — N3000 Acute cystitis without hematuria: Secondary | ICD-10-CM

## 2021-11-09 DIAGNOSIS — F17210 Nicotine dependence, cigarettes, uncomplicated: Secondary | ICD-10-CM | POA: Diagnosis present

## 2021-11-09 DIAGNOSIS — Z981 Arthrodesis status: Secondary | ICD-10-CM

## 2021-11-09 DIAGNOSIS — R110 Nausea: Secondary | ICD-10-CM | POA: Diagnosis not present

## 2021-11-09 DIAGNOSIS — Z91013 Allergy to seafood: Secondary | ICD-10-CM

## 2021-11-09 DIAGNOSIS — J449 Chronic obstructive pulmonary disease, unspecified: Secondary | ICD-10-CM | POA: Diagnosis present

## 2021-11-09 DIAGNOSIS — T368X5A Adverse effect of other systemic antibiotics, initial encounter: Secondary | ICD-10-CM | POA: Diagnosis present

## 2021-11-09 DIAGNOSIS — E860 Dehydration: Secondary | ICD-10-CM | POA: Diagnosis not present

## 2021-11-09 DIAGNOSIS — Z8249 Family history of ischemic heart disease and other diseases of the circulatory system: Secondary | ICD-10-CM

## 2021-11-09 DIAGNOSIS — K8500 Idiopathic acute pancreatitis without necrosis or infection: Secondary | ICD-10-CM

## 2021-11-09 LAB — CBC
HCT: 34.2 % — ABNORMAL LOW (ref 36.0–46.0)
Hemoglobin: 11.1 g/dL — ABNORMAL LOW (ref 12.0–15.0)
MCH: 29.6 pg (ref 26.0–34.0)
MCHC: 32.5 g/dL (ref 30.0–36.0)
MCV: 91.2 fL (ref 80.0–100.0)
Platelets: 337 10*3/uL (ref 150–400)
RBC: 3.75 MIL/uL — ABNORMAL LOW (ref 3.87–5.11)
RDW: 14.3 % (ref 11.5–15.5)
WBC: 16.9 10*3/uL — ABNORMAL HIGH (ref 4.0–10.5)
nRBC: 0 % (ref 0.0–0.2)

## 2021-11-09 LAB — COMPREHENSIVE METABOLIC PANEL
ALT: 6 U/L (ref 0–44)
AST: 19 U/L (ref 15–41)
Albumin: 2.9 g/dL — ABNORMAL LOW (ref 3.5–5.0)
Alkaline Phosphatase: 187 U/L — ABNORMAL HIGH (ref 38–126)
Anion gap: 10 (ref 5–15)
BUN: 32 mg/dL — ABNORMAL HIGH (ref 8–23)
CO2: 20 mmol/L — ABNORMAL LOW (ref 22–32)
Calcium: 9.2 mg/dL (ref 8.9–10.3)
Chloride: 101 mmol/L (ref 98–111)
Creatinine, Ser: 1.91 mg/dL — ABNORMAL HIGH (ref 0.44–1.00)
GFR, Estimated: 27 mL/min — ABNORMAL LOW (ref >60)
Glucose, Bld: 161 mg/dL — ABNORMAL HIGH (ref 70–99)
Potassium: 3.6 mmol/L (ref 3.5–5.1)
Sodium: 131 mmol/L — ABNORMAL LOW (ref 135–145)
Total Bilirubin: 0.7 mg/dL (ref 0.3–1.2)
Total Protein: 6 g/dL — ABNORMAL LOW (ref 6.5–8.1)

## 2021-11-09 LAB — LIPASE, BLOOD: Lipase: 182 U/L — ABNORMAL HIGH (ref 11–51)

## 2021-11-09 MED ORDER — LACTATED RINGERS IV BOLUS
1000.0000 mL | Freq: Once | INTRAVENOUS | Status: AC
  Administered 2021-11-09: 1000 mL via INTRAVENOUS

## 2021-11-09 MED ORDER — ONDANSETRON HCL 4 MG/2ML IJ SOLN
4.0000 mg | Freq: Once | INTRAMUSCULAR | Status: AC
  Administered 2021-11-09: 4 mg via INTRAVENOUS
  Filled 2021-11-09: qty 2

## 2021-11-09 MED ORDER — ONDANSETRON 4 MG PO TBDP
4.0000 mg | ORAL_TABLET | Freq: Once | ORAL | Status: AC
  Administered 2021-11-09: 4 mg via ORAL
  Filled 2021-11-09: qty 1

## 2021-11-09 MED ORDER — MORPHINE SULFATE (PF) 2 MG/ML IV SOLN
2.0000 mg | Freq: Once | INTRAVENOUS | Status: AC
  Administered 2021-11-09: 2 mg via INTRAVENOUS
  Filled 2021-11-09: qty 1

## 2021-11-09 NOTE — ED Notes (Signed)
Pt continues to be nauseous in triage, attempting to vomit.

## 2021-11-09 NOTE — ED Notes (Signed)
See triage note. Pt has had N/V/D for past 3 days; pt being treated for UTI past 3 weeks as well as yeast infection for 2 weeks; pt not currently vomiting; pt's family at bedside reports she tried both zofran and phenergan at home without relief; pt denies fever; pt's resp reg/unlabored; skin dry; calmly laying on stretcher; pt reports mild tenderness and general discomfort in abdomen. Pt reports back pain from chair and stretcher. Pt has been on scheduled tylenol at home but hasn't taken any recently.

## 2021-11-09 NOTE — ED Provider Notes (Signed)
[facility] Provider Note    Event Date/Time   First MD Initiated Contact with Patient 11/09/21 2307     (approximate)   History   Emesis   HPI  Mckenzie Lawrence is a 76 y.o. female who presents to the ED for evaluation of Emesis   I reviewed PCP visit from 3 days ago.  History of CAD, COPD, DM, stroke.  DAPT with Plavix.  Patient presents to the ED with her daughter for evaluation of recurrent emesis for the past few days.  They tell me that she was diagnosed with acute cystitis at the urologist's office last week via a catheterized urine sample and the patient was started on doxycycline to treat a UTI.  Patient has had increasing recurrent emesis for the past few days, unable to tolerate her typical medications.  Has not been able to keep her regular prescriptions or her doxycycline antibiotic down.  Reports feeling "so weak and so awful."  No fevers, falls or syncope   Physical Exam   Triage Vital Signs: ED Triage Vitals  Enc Vitals Group     BP 11/09/21 1915 (!) 103/56     Pulse Rate 11/09/21 1915 96     Resp 11/09/21 1915 18     Temp 11/09/21 1915 (!) 97.4 F (36.3 C)     Temp Source 11/09/21 1915 Oral     SpO2 11/09/21 1915 98 %     Weight 11/09/21 1935 167 lb (75.8 kg)     Height 11/09/21 1935 '5\' 6"'$  (1.676 m)     Head Circumference --      Peak Flow --      Pain Score 11/09/21 1934 7     Pain Loc --      Pain Edu? --      Excl. in [place]? --     Most recent vital signs: Vitals:   11/09/21 2300 11/10/21 0000  BP: (!) 183/61 (!) 170/70  Pulse: 81 84  Resp: 18 18  Temp:    SpO2: 100% 100%    General: Awake, no distress.  Seems uncomfortable. CV:  Good peripheral perfusion.  Resp:  Normal effort.  Abd:  No distention.  Periumbilical and left-sided tenderness, suprapubic tenderness.  Mild/lesser epigastric/RUQ tenderness.  No peritoneal features MSK:  No deformity noted.  Neuro:  No focal deficits appreciated. Other:     ED  Results / Procedures / Treatments   Labs (all labs ordered are listed, but only abnormal results are displayed) Labs Reviewed  LIPASE, BLOOD - Abnormal; Notable for the following components:      Result Value   Lipase 182 (*)    All other components within normal limits  COMPREHENSIVE METABOLIC PANEL - Abnormal; Notable for the following components:   Sodium 131 (*)    CO2 20 (*)    Glucose, Bld 161 (*)    BUN 32 (*)    Creatinine, Ser 1.91 (*)    Total Protein 6.0 (*)    Albumin 2.9 (*)    Alkaline Phosphatase 187 (*)    GFR, Estimated 27 (*)    All other components within normal limits  CBC - Abnormal; Notable for the following components:   WBC 16.9 (*)    RBC 3.75 (*)    Hemoglobin 11.1 (*)    HCT 34.2 (*)    All other components within normal limits  URINALYSIS, ROUTINE W REFLEX MICROSCOPIC - Abnormal; Notable for the following components:   Color,  Urine AMBER (*)    APPearance CLOUDY (*)    Protein, ur 100 (*)    Leukocytes,Ua MODERATE (*)    WBC, UA >50 (*)    Bacteria, UA RARE (*)    All other components within normal limits  URINE CULTURE    EKG Sinus rhythm, rate of 99 bpm.  Rightward axis and normal intervals.  Couple PACs.  No evidence of acute ischemia.  RADIOLOGY CT abdomen/pelvis interpreted by me without evidence of cholelithiasis  Official radiology report(s): CT ABDOMEN PELVIS WO CONTRAST  Result Date: 11/10/2021 CLINICAL DATA:  Pancreatitis, vomiting EXAM: CT ABDOMEN AND PELVIS WITHOUT CONTRAST TECHNIQUE: Multidetector CT imaging of the abdomen and pelvis was performed following the standard protocol without IV contrast. RADIATION DOSE REDUCTION: This exam was performed according to the departmental dose-optimization program which includes automated exposure control, adjustment of the mA and/or kV according to patient size and/or use of iterative reconstruction technique. COMPARISON:  CT abdomen/pelvis dated 03/13/2007 FINDINGS: Lower chest: Lung bases  are clear. Hepatobiliary: Unenhanced liver is unremarkable. Mild gallbladder sludge (series 2/image 26), without associated inflammatory changes. No intrahepatic or extrahepatic duct dilatation. Pancreas: Very mild peripancreatic stranding along the pancreatic tail (series 2/image 27), suggesting acute pancreatitis. No drainable fluid collection/pseudocyst. Spleen: Within normal limits. Adrenals/Urinary Tract: Adrenal glands are within normal limits. Kidneys are within normal limits. No renal, ureteral, or bladder calculi. No hydronephrosis. Mildly thick-walled bladder with perivesical stranding. Stomach/Bowel: Stomach is within normal limits. No evidence of bowel obstruction. Normal appendix (series 2/image 4). No colonic wall thickening or inflammatory changes. Vascular/Lymphatic: No evidence of abdominal aortic aneurysm. Atherosclerotic calcifications of the abdominal aorta and branch vessels. No suspicious abdominopelvic lymphadenopathy. Reproductive: Prostate is unremarkable. Other: No abdominopelvic ascites. Musculoskeletal: Status post PLIF at L4-S1. IMPRESSION: Very mild peripancreatic stranding along the pancreatic tail, suggesting acute pancreatitis. No drainable fluid collection/pseudocyst. Mildly thick-walled bladder with perivesical stranding, correlate for cystitis. Electronically Signed   By: Julian Hy M.D.   On: 11/10/2021 00:02    PROCEDURES and INTERVENTIONS:  .1-3 Lead EKG Interpretation Performed by: Vladimir Crofts, MD Authorized by: Vladimir Crofts, MD     Interpretation: normal     ECG rate:  80   ECG rate assessment: normal     Rhythm: sinus rhythm     Ectopy: none     Conduction: normal    Medications  cefTRIAXone (ROCEPHIN) 1 g in sodium chloride 0.9 % 100 mL IVPB (1 g Intravenous New Bag/Given 11/10/21 0142)  ondansetron (ZOFRAN-ODT) disintegrating tablet 4 mg (4 mg Oral Given 11/09/21 2128)  lactated ringers bolus 1,000 mL (0 mLs Intravenous Stopped 11/10/21 0136)   ondansetron (ZOFRAN) injection 4 mg (4 mg Intravenous Given 11/09/21 2338)  morphine (PF) 2 MG/ML injection 2 mg (2 mg Intravenous Given 11/09/21 2356)  metoCLOPramide (REGLAN) injection 10 mg (10 mg Intravenous Given 11/10/21 0140)     IMPRESSION / MDM / ASSESSMENT AND PLAN / ED COURSE  I reviewed the triage vital signs and the nursing notes.  Differential diagnosis includes, but is not limited to, sepsis, pyelonephritis, cystitis, pancreatitis, cholelithiasis, cholecystitis, AKI, kidney stone  {Patient presents with symptoms of an acute illness or injury that is potentially life-threatening.  76 year old female presents to the ED with recurrent emesis with evidence of acute cystitis and pancreatitis requiring medical admission.  No fevers, instability or signs of shock.  Blood work with CKD around baseline and leukocytosis is noted.  Elevated lipase.  Urine with infectious features and she has not been  able to take her antibiotics considering her illness.  We will send her urine for culture and start IV antibiotics and IV rehydration.  CT without complicating features to her pancreatitis or evidence of urologic obstruction.  Due to her continued symptoms we will consult with medicine for admission.      FINAL CLINICAL IMPRESSION(S) / ED DIAGNOSES   Final diagnoses:  Nausea and vomiting, unspecified vomiting type  Acute cystitis without hematuria  Idiopathic acute pancreatitis without infection or necrosis     Rx / DC Orders   ED Discharge Orders     None        Note:  This document was prepared using Dragon voice recognition software and may include unintentional dictation errors.   Vladimir Crofts, MD 11/10/21 [phone]

## 2021-11-09 NOTE — ED Provider Triage Note (Signed)
Emergency Medicine Provider Triage Evaluation Note  Mckenzie Lawrence, a 76 y.o. female  was evaluated in triage.  Pt complains of nausea, vomiting, and diarrhea.  Patient reports that this will last 3 days with inability to take her home medication due to ongoing nausea vomiting.  She is 4 weeks status post lumbar fusion surgery, but denies any bladder or bowel incontinence, foot drop, saddle anesthesia.  She does also endorse some urinary tract infection symptoms currently being treated by her primary provider.  She has however, been unable to keep down the antibiotics prescribed.  She presents via EMS from home for evaluation of her symptoms.  Review of Systems  Positive: NVD Negative: CP, SOB  Physical Exam  BP (!) 103/56   Pulse 96   Temp (!) 97.4 F (36.3 C) (Oral)   Resp 18   Ht '5\' 6"'$  (1.676 m)   Wt 75.8 kg   SpO2 98%   BMI 26.95 kg/m  Gen:   Awake, no distress   Resp:  Normal effort CTA MSK:   Moves extremities without difficulty  CVS:  RRR  Medical Decision Making  Medically screening exam initiated at 7:37 PM.  Appropriate orders placed.  Mckenzie Lawrence was informed that the remainder of the evaluation will be completed by another provider, this initial triage assessment does not replace that evaluation, and the importance of remaining in the ED until their evaluation is complete.  Presents to the ED for evaluation of 3 days of nausea, vomiting, and diarrhea.   Mckenzie Needles, PA-C 11/09/21 1939

## 2021-11-09 NOTE — ED Triage Notes (Signed)
TO triage in wheelchair with c/o Vomiting x 2 days. Diarrhea but had resolved after taking Immodium. Provider in triage at this time. Pt also c/o possible unresolved UTI. Reports painful urination with burning

## 2021-11-09 NOTE — ED Notes (Signed)
Provider D. Smith to bedside.

## 2021-11-10 ENCOUNTER — Inpatient Hospital Stay: Payer: HMO

## 2021-11-10 DIAGNOSIS — N184 Chronic kidney disease, stage 4 (severe): Secondary | ICD-10-CM | POA: Diagnosis present

## 2021-11-10 DIAGNOSIS — E785 Hyperlipidemia, unspecified: Secondary | ICD-10-CM | POA: Diagnosis present

## 2021-11-10 DIAGNOSIS — K859 Acute pancreatitis without necrosis or infection, unspecified: Secondary | ICD-10-CM

## 2021-11-10 DIAGNOSIS — T368X5A Adverse effect of other systemic antibiotics, initial encounter: Secondary | ICD-10-CM | POA: Diagnosis present

## 2021-11-10 DIAGNOSIS — E1169 Type 2 diabetes mellitus with other specified complication: Secondary | ICD-10-CM

## 2021-11-10 DIAGNOSIS — N3000 Acute cystitis without hematuria: Secondary | ICD-10-CM | POA: Diagnosis not present

## 2021-11-10 DIAGNOSIS — K853 Drug induced acute pancreatitis without necrosis or infection: Secondary | ICD-10-CM | POA: Diagnosis present

## 2021-11-10 DIAGNOSIS — Z882 Allergy status to sulfonamides status: Secondary | ICD-10-CM | POA: Diagnosis not present

## 2021-11-10 DIAGNOSIS — Z79899 Other long term (current) drug therapy: Secondary | ICD-10-CM | POA: Diagnosis not present

## 2021-11-10 DIAGNOSIS — T466X5A Adverse effect of antihyperlipidemic and antiarteriosclerotic drugs, initial encounter: Secondary | ICD-10-CM | POA: Diagnosis present

## 2021-11-10 DIAGNOSIS — E1129 Type 2 diabetes mellitus with other diabetic kidney complication: Secondary | ICD-10-CM

## 2021-11-10 DIAGNOSIS — Z885 Allergy status to narcotic agent status: Secondary | ICD-10-CM | POA: Diagnosis not present

## 2021-11-10 DIAGNOSIS — I2510 Atherosclerotic heart disease of native coronary artery without angina pectoris: Secondary | ICD-10-CM

## 2021-11-10 DIAGNOSIS — Z794 Long term (current) use of insulin: Secondary | ICD-10-CM | POA: Diagnosis not present

## 2021-11-10 DIAGNOSIS — J449 Chronic obstructive pulmonary disease, unspecified: Secondary | ICD-10-CM | POA: Diagnosis present

## 2021-11-10 DIAGNOSIS — Z888 Allergy status to other drugs, medicaments and biological substances status: Secondary | ICD-10-CM | POA: Diagnosis not present

## 2021-11-10 DIAGNOSIS — I10 Essential (primary) hypertension: Secondary | ICD-10-CM | POA: Diagnosis not present

## 2021-11-10 DIAGNOSIS — Z91041 Radiographic dye allergy status: Secondary | ICD-10-CM | POA: Diagnosis not present

## 2021-11-10 DIAGNOSIS — Z7982 Long term (current) use of aspirin: Secondary | ICD-10-CM | POA: Diagnosis not present

## 2021-11-10 DIAGNOSIS — N390 Urinary tract infection, site not specified: Secondary | ICD-10-CM | POA: Diagnosis not present

## 2021-11-10 DIAGNOSIS — I129 Hypertensive chronic kidney disease with stage 1 through stage 4 chronic kidney disease, or unspecified chronic kidney disease: Secondary | ICD-10-CM | POA: Diagnosis present

## 2021-11-10 DIAGNOSIS — Z90710 Acquired absence of both cervix and uterus: Secondary | ICD-10-CM | POA: Diagnosis not present

## 2021-11-10 DIAGNOSIS — Z7951 Long term (current) use of inhaled steroids: Secondary | ICD-10-CM | POA: Diagnosis not present

## 2021-11-10 DIAGNOSIS — Z87892 Personal history of anaphylaxis: Secondary | ICD-10-CM | POA: Diagnosis not present

## 2021-11-10 DIAGNOSIS — F17210 Nicotine dependence, cigarettes, uncomplicated: Secondary | ICD-10-CM | POA: Diagnosis present

## 2021-11-10 DIAGNOSIS — E1122 Type 2 diabetes mellitus with diabetic chronic kidney disease: Secondary | ICD-10-CM | POA: Diagnosis present

## 2021-11-10 DIAGNOSIS — Z8673 Personal history of transient ischemic attack (TIA), and cerebral infarction without residual deficits: Secondary | ICD-10-CM | POA: Diagnosis not present

## 2021-11-10 DIAGNOSIS — G4733 Obstructive sleep apnea (adult) (pediatric): Secondary | ICD-10-CM | POA: Diagnosis not present

## 2021-11-10 DIAGNOSIS — Z7902 Long term (current) use of antithrombotics/antiplatelets: Secondary | ICD-10-CM | POA: Diagnosis not present

## 2021-11-10 DIAGNOSIS — Z981 Arthrodesis status: Secondary | ICD-10-CM | POA: Diagnosis not present

## 2021-11-10 LAB — CBC
HCT: 30.2 % — ABNORMAL LOW (ref 36.0–46.0)
Hemoglobin: 10.1 g/dL — ABNORMAL LOW (ref 12.0–15.0)
MCH: 29.8 pg (ref 26.0–34.0)
MCHC: 33.4 g/dL (ref 30.0–36.0)
MCV: 89.1 fL (ref 80.0–100.0)
Platelets: 304 10*3/uL (ref 150–400)
RBC: 3.39 MIL/uL — ABNORMAL LOW (ref 3.87–5.11)
RDW: 14.2 % (ref 11.5–15.5)
WBC: 16.2 10*3/uL — ABNORMAL HIGH (ref 4.0–10.5)
nRBC: 0 % (ref 0.0–0.2)

## 2021-11-10 LAB — BASIC METABOLIC PANEL
Anion gap: 8 (ref 5–15)
BUN: 34 mg/dL — ABNORMAL HIGH (ref 8–23)
CO2: 18 mmol/L — ABNORMAL LOW (ref 22–32)
Calcium: 9 mg/dL (ref 8.9–10.3)
Chloride: 105 mmol/L (ref 98–111)
Creatinine, Ser: 1.76 mg/dL — ABNORMAL HIGH (ref 0.44–1.00)
GFR, Estimated: 30 mL/min — ABNORMAL LOW (ref >60)
Glucose, Bld: 92 mg/dL (ref 70–99)
Potassium: 3.7 mmol/L (ref 3.5–5.1)
Sodium: 131 mmol/L — ABNORMAL LOW (ref 135–145)

## 2021-11-10 LAB — URINALYSIS, ROUTINE W REFLEX MICROSCOPIC
Bilirubin Urine: NEGATIVE
Glucose, UA: NEGATIVE mg/dL
Hgb urine dipstick: NEGATIVE
Ketones, ur: NEGATIVE mg/dL
Nitrite: NEGATIVE
Protein, ur: 100 mg/dL — AB
Specific Gravity, Urine: 1.013 (ref 1.005–1.030)
WBC, UA: >50 WBC/hpf — ABNORMAL HIGH (ref 0–5)
pH: 5 (ref 5.0–8.0)

## 2021-11-10 LAB — GLUCOSE, CAPILLARY
Glucose-Capillary: 98 mg/dL (ref 70–99)
Glucose-Capillary: 118 mg/dL — ABNORMAL HIGH (ref 70–99)
Glucose-Capillary: 123 mg/dL — ABNORMAL HIGH (ref 70–99)
Glucose-Capillary: 128 mg/dL — ABNORMAL HIGH (ref 70–99)

## 2021-11-10 LAB — TRIGLYCERIDES: Triglycerides: 122 mg/dL (ref <150)

## 2021-11-10 MED ORDER — MAGNESIUM HYDROXIDE 400 MG/5ML PO SUSP: 30.0000 mL | Freq: Every day | ORAL | Status: DC | PRN

## 2021-11-10 MED ORDER — SODIUM CHLORIDE 0.9 % IV SOLN
1.0000 g | INTRAVENOUS | Status: DC
  Administered 2021-11-11: 1 g via INTRAVENOUS
  Filled 2021-11-10: qty 10

## 2021-11-10 MED ORDER — CLOPIDOGREL BISULFATE 75 MG PO TABS
75.0000 mg | ORAL_TABLET | Freq: Every day | ORAL | Status: DC
  Administered 2021-11-11 – 2021-11-12 (×2): 75 mg via ORAL
  Filled 2021-11-10 (×3): qty 1

## 2021-11-10 MED ORDER — TRAMADOL HCL 50 MG PO TABS: 50.0000 mg | ORAL_TABLET | Freq: Four times a day (QID) | ORAL | Status: DC | PRN

## 2021-11-10 MED ORDER — ONDANSETRON HCL 4 MG PO TABS: 4.0000 mg | ORAL_TABLET | Freq: Four times a day (QID) | ORAL | Status: DC | PRN

## 2021-11-10 MED ORDER — HYDROCODONE-ACETAMINOPHEN 5-325 MG PO TABS: 1.0000 | ORAL_TABLET | Freq: Four times a day (QID) | ORAL | Status: DC | PRN

## 2021-11-10 MED ORDER — HYDRALAZINE HCL 25 MG PO TABS
25.0000 mg | ORAL_TABLET | Freq: Three times a day (TID) | ORAL | Status: DC
Start: 2021-11-10 — End: 2021-11-12
  Administered 2021-11-11 – 2021-11-12 (×4): 25 mg via ORAL
  Filled 2021-11-10 (×5): qty 1

## 2021-11-10 MED ORDER — METOPROLOL SUCCINATE ER 100 MG PO TB24
100.0000 mg | ORAL_TABLET | Freq: Every day | ORAL | Status: DC
  Administered 2021-11-11 – 2021-11-12 (×2): 100 mg via ORAL
  Filled 2021-11-10 (×3): qty 1

## 2021-11-10 MED ORDER — LOSARTAN POTASSIUM 50 MG PO TABS
100.0000 mg | ORAL_TABLET | Freq: Every day | ORAL | Status: DC
  Administered 2021-11-11 – 2021-11-12 (×2): 100 mg via ORAL
  Filled 2021-11-10 (×3): qty 2

## 2021-11-10 MED ORDER — ONDANSETRON 4 MG PO TBDP: 8.0000 mg | ORAL_TABLET | Freq: Three times a day (TID) | ORAL | Status: DC | PRN

## 2021-11-10 MED ORDER — INSULIN ASPART 100 UNIT/ML IJ SOLN
0.0000 [IU] | Freq: Four times a day (QID) | INTRAMUSCULAR | Status: DC
  Administered 2021-11-11: 2 [IU] via SUBCUTANEOUS
  Filled 2021-11-10 (×2): qty 1

## 2021-11-10 MED ORDER — METOCLOPRAMIDE HCL 5 MG/ML IJ SOLN
5.0000 mg | Freq: Four times a day (QID) | INTRAMUSCULAR | Status: DC | PRN
  Administered 2021-11-10: 5 mg via INTRAVENOUS
  Filled 2021-11-10 (×2): qty 2

## 2021-11-10 MED ORDER — HYDRALAZINE HCL 20 MG/ML IJ SOLN
10.0000 mg | Freq: Four times a day (QID) | INTRAMUSCULAR | Status: DC | PRN
  Administered 2021-11-10 – 2021-11-11 (×4): 10 mg via INTRAVENOUS
  Filled 2021-11-10 (×4): qty 1

## 2021-11-10 MED ORDER — POTASSIUM CHLORIDE IN NACL 20-0.9 MEQ/L-% IV SOLN
INTRAVENOUS | Status: DC
  Filled 2021-11-10 (×7): qty 1000

## 2021-11-10 MED ORDER — ACETAMINOPHEN 325 MG PO TABS
650.0000 mg | ORAL_TABLET | Freq: Four times a day (QID) | ORAL | Status: DC | PRN
  Administered 2021-11-11 – 2021-11-12 (×4): 650 mg via ORAL
  Filled 2021-11-10 (×5): qty 2

## 2021-11-10 MED ORDER — LABETALOL HCL 5 MG/ML IV SOLN
20.0000 mg | INTRAVENOUS | Status: DC | PRN
Start: 2021-11-10 — End: 2021-11-12
  Administered 2021-11-10: 20 mg via INTRAVENOUS
  Filled 2021-11-10: qty 4

## 2021-11-10 MED ORDER — PANTOPRAZOLE SODIUM 40 MG PO TBEC
40.0000 mg | DELAYED_RELEASE_TABLET | Freq: Every day | ORAL | Status: DC
  Administered 2021-11-10 – 2021-11-12 (×3): 40 mg via ORAL
  Filled 2021-11-10 (×3): qty 1

## 2021-11-10 MED ORDER — MIRTAZAPINE 15 MG PO TABS
7.5000 mg | ORAL_TABLET | Freq: Every day | ORAL | Status: DC
  Filled 2021-11-10: qty 1

## 2021-11-10 MED ORDER — DIAZEPAM 5 MG PO TABS: 10.0000 mg | ORAL_TABLET | ORAL | Status: DC | PRN

## 2021-11-10 MED ORDER — ENOXAPARIN SODIUM 30 MG/0.3ML IJ SOSY
30.0000 mg | PREFILLED_SYRINGE | INTRAMUSCULAR | Status: DC
  Administered 2021-11-10 – 2021-11-12 (×3): 30 mg via SUBCUTANEOUS
  Filled 2021-11-10 (×3): qty 0.3

## 2021-11-10 MED ORDER — CALCITRIOL 0.25 MCG PO CAPS
0.2500 ug | ORAL_CAPSULE | Freq: Every day | ORAL | Status: DC
  Administered 2021-11-11 – 2021-11-12 (×2): 0.25 ug via ORAL
  Filled 2021-11-10 (×3): qty 1

## 2021-11-10 MED ORDER — FENTANYL CITRATE PF 50 MCG/ML IJ SOSY
12.5000 ug | PREFILLED_SYRINGE | INTRAMUSCULAR | Status: DC | PRN
  Administered 2021-11-10: 12.5 ug via INTRAVENOUS
  Filled 2021-11-10: qty 1

## 2021-11-10 MED ORDER — OXYCODONE HCL 5 MG PO TABS
5.0000 mg | ORAL_TABLET | Freq: Four times a day (QID) | ORAL | Status: DC | PRN
  Filled 2021-11-10: qty 1

## 2021-11-10 MED ORDER — METOCLOPRAMIDE HCL 5 MG/ML IJ SOLN
10.0000 mg | Freq: Once | INTRAMUSCULAR | Status: AC
Start: 2021-11-10 — End: 2021-11-10
  Administered 2021-11-10: 10 mg via INTRAVENOUS
  Filled 2021-11-10: qty 2

## 2021-11-10 MED ORDER — SODIUM CHLORIDE 0.9 % IV SOLN
1.0000 g | Freq: Once | INTRAVENOUS | Status: AC
  Administered 2021-11-10: 1 g via INTRAVENOUS
  Filled 2021-11-10: qty 10

## 2021-11-10 MED ORDER — INSULIN DETEMIR 100 UNIT/ML ~~LOC~~ SOLN
30.0000 [IU] | Freq: Every day | SUBCUTANEOUS | Status: DC
  Administered 2021-11-11 – 2021-11-12 (×2): 30 [IU] via SUBCUTANEOUS
  Filled 2021-11-10 (×3): qty 0.3

## 2021-11-10 MED ORDER — ACETAMINOPHEN 650 MG RE SUPP: 650.0000 mg | Freq: Four times a day (QID) | RECTAL | Status: DC | PRN

## 2021-11-10 MED ORDER — ONDANSETRON HCL 4 MG/2ML IJ SOLN
4.0000 mg | Freq: Four times a day (QID) | INTRAMUSCULAR | Status: DC | PRN
Start: 2021-11-10 — End: 2021-11-12

## 2021-11-10 MED ORDER — ROSUVASTATIN CALCIUM 5 MG PO TABS
5.0000 mg | ORAL_TABLET | Freq: Every day | ORAL | Status: DC
  Filled 2021-11-10: qty 1

## 2021-11-10 MED ORDER — TORSEMIDE 20 MG PO TABS: 40.0000 mg | ORAL_TABLET | Freq: Every day | ORAL | Status: DC | PRN

## 2021-11-10 MED ORDER — FENTANYL CITRATE PF 50 MCG/ML IJ SOSY
12.5000 ug | PREFILLED_SYRINGE | INTRAMUSCULAR | Status: DC | PRN
  Administered 2021-11-10 (×2): 12.5 ug via INTRAVENOUS
  Filled 2021-11-10 (×2): qty 1

## 2021-11-10 MED ORDER — OXYCODONE HCL 5 MG PO TABS: 5.0000 mg | ORAL_TABLET | Freq: Four times a day (QID) | ORAL | Status: DC | PRN

## 2021-11-10 MED ORDER — OXYBUTYNIN CHLORIDE 5 MG PO TABS: 5.0000 mg | ORAL_TABLET | Freq: Every evening | ORAL | Status: DC | PRN

## 2021-11-10 MED ORDER — TRAZODONE HCL 50 MG PO TABS: 25.0000 mg | ORAL_TABLET | Freq: Every evening | ORAL | Status: DC | PRN

## 2021-11-10 MED ORDER — ASPIRIN 81 MG PO TBEC
81.0000 mg | DELAYED_RELEASE_TABLET | Freq: Every day | ORAL | Status: DC
  Administered 2021-11-11 – 2021-11-12 (×2): 81 mg via ORAL
  Filled 2021-11-10 (×3): qty 1

## 2021-11-10 NOTE — Assessment & Plan Note (Addendum)
Continue Toprol losartan and hydralazine

## 2021-11-10 NOTE — Evaluation (Signed)
Physical Therapy Evaluation Patient Details Name: Mckenzie Lawrence MRN: [id number] DOB: 08-Sep-1945 Today's Date: 11/10/2021  History of Present Illness  Pt is a 76 y/o F admitted on 11/09/21 after presenting to the ED with acute onset of epigastric abdominal pain with associated N&V over the last 3 days, diarrhea that has resolved & dysuria & urinary frequency & urgency. Abdominal pelvic CT suggestive of acute pancreatitis & cystitis. PMH: COPD, CVA, DM2, OSA on CPAP, arthritis, bladder incontinence, B cataracts, HLD, LBP, orthostatic hypotension, PCOS, sleep apnea, L4-S1 PLIF on 10/01/21  Clinical Impression  Pt received in bathroom in care of nurse with handoff to PT. Pt confirms back surgery last month & when asked about her brace she said she hasn't been wearing it but not necessarily due to the direction of the sturgeon. PT educated pt on need to wear LSO brace with mobility & request her husband bring it in for her to use. Pt reports prior to back sx she was independent without AD but since sx she has been receiving HHPT & mobilizing with RW. On this date, pt is able to toilet without assistance & ambulate with RW in room with mod I with pt pushing RW out in front of her. Pt declines further gait 2/2 feeling "weak". Will continue to see pt acutely to progress gait without AD to try to facilitate return to PLOF, as well as focus on balance, strengthening, endurance, safety awareness & adherence to back precautions & stair negotiation.      Recommendations for follow up therapy are one component of a multi-disciplinary discharge planning process, led by the attending physician.  Recommendations may be updated based on patient status, additional functional criteria and insurance authorization.  Follow Up Recommendations Home health PT (can resume HHPT services)    Assistance Recommended at Discharge Set up Supervision/Assistance  Patient can return home with the following  Assistance with  cooking/housework;Assist for transportation;Direct supervision/assist for medications management;Help with stairs or ramp for entrance    Equipment Recommendations None recommended by PT  Recommendations for Other Services       Functional Status Assessment Patient has had a recent decline in their functional status and demonstrates the ability to make significant improvements in function in a reasonable and predictable amount of time.     Precautions / Restrictions Precautions Precautions: Fall;Back Required Braces or Orthoses: Spinal Brace Spinal Brace: Lumbar corset;Applied in sitting position Restrictions Weight Bearing Restrictions: No      Mobility  Bed Mobility               General bed mobility comments: not observed, pt received in bathroom in care of nurse    Transfers Overall transfer level: Modified independent                 General transfer comment: STS from toilet without physical assistance    Ambulation/Gait Ambulation/Gait assistance: Modified independent (Device/Increase time) Gait Distance (Feet): 25 Feet Assistive device: Rolling walker (2 wheels)   Gait velocity: WNL     General Gait Details: Pt pushes RW out in front of her  Stairs            Wheelchair Mobility    Modified Rankin (Stroke Patients Only)       Balance Overall balance assessment: Needs assistance   Sitting balance-Leahy Scale: Good     Standing balance support: Bilateral upper extremity supported, During functional activity Standing balance-Leahy Scale: Good Standing balance comment: ambulatory with RW with mod I, performs  hand hygiene standing at sink with mod I                             Pertinent Vitals/Pain Pain Assessment Pain Assessment: Faces Faces Pain Scale: Hurts little more Pain Location: upper central abdomen Pain Descriptors / Indicators: Discomfort Pain Intervention(s): Monitored during session (nurse aware)     Home Living Family/patient expects to be discharged to:: Private residence Living Arrangements: Spouse/significant other Available Help at Discharge: Family;Available 24 hours/day Type of Home:  (condo) Home Access: Stairs to enter Entrance Stairs-Rails: Right;Left;Can reach both Entrance Stairs-Number of Steps: 4-5   Home Layout: One level Home Equipment: BSC/3in1;Shower seat - built in;Grab bars - toilet;Grab bars - tub/shower;Rollator (4 wheels);Rolling Walker (2 wheels)      Prior Function               Mobility Comments: Pt reports prior to back sx she was independent without AD. Since surgery pt has been ambulating with RW & receiving HHPT.       Hand Dominance        Extremity/Trunk Assessment   Upper Extremity Assessment Upper Extremity Assessment: Overall WFL for tasks assessed    Lower Extremity Assessment Lower Extremity Assessment: Generalized weakness       Communication   Communication: No difficulties  Cognition Arousal/Alertness: Awake/alert Behavior During Therapy: WFL for tasks assessed/performed, Flat affect Overall Cognitive Status: Within Functional Limits for tasks assessed                                          General Comments      Exercises     Assessment/Plan    PT Assessment Patient needs continued PT services  PT Problem List Decreased strength;Decreased activity tolerance;Decreased balance;Decreased mobility;Decreased knowledge of precautions;Decreased knowledge of use of DME;Decreased safety awareness       PT Treatment Interventions Therapeutic exercise;DME instruction;Gait training;Balance training;Stair training;Neuromuscular re-education;Functional mobility training;Therapeutic activities;Patient/family education;Modalities;Manual techniques    PT Goals (Current goals can be found in the Care Plan section)  Acute Rehab PT Goals Patient Stated Goal: feel better PT Goal Formulation: With  patient Time For Goal Achievement: 11/24/21 Potential to Achieve Goals: Good    Frequency Min 2X/week     Co-evaluation               AM-PAC PT "6 Clicks" Mobility  Outcome Measure Help needed turning from your back to your side while in a flat bed without using bedrails?: None Help needed moving from lying on your back to sitting on the side of a flat bed without using bedrails?: None Help needed moving to and from a bed to a chair (including a wheelchair)?: None Help needed standing up from a chair using your arms (e.g., wheelchair or bedside chair)?: None Help needed to walk in hospital room?: None Help needed climbing 3-5 steps with a railing? : A Little 6 Click Score: 23    End of Session   Activity Tolerance: Patient limited by fatigue Patient left: in chair;with nursing/sitter in room;with call bell/phone within reach Nurse Communication: Mobility status (need for LSO) PT Visit Diagnosis: Muscle weakness (generalized) (M62.81);Unsteadiness on feet (R26.81)    Time: 6195-0932 PT Time Calculation (min) (ACUTE ONLY): 8 min   Charges:   PT Evaluation $PT Eval Low Complexity: 1 Low  Lavone Nian, PT, DPT 11/10/21, 2:40 PM   Waunita Schooner 11/10/2021, 2:37 PM

## 2021-11-10 NOTE — Assessment & Plan Note (Deleted)
-   The patient will be placed on supplemental coverage with NovoLog. - We will continue basal coverage. 

## 2021-11-10 NOTE — Assessment & Plan Note (Addendum)
Patient's urinary symptoms have improved.  The urine culture only grew 10,000 colonies and the patient did not receive an antibiotic that would cover the Morganella that grew out of the culture.  Suspect that this is a colonization rather than a true infection.  Follow-up with urology as outpatient.

## 2021-11-10 NOTE — Assessment & Plan Note (Addendum)
Continue CPAP nightly. °

## 2021-11-10 NOTE — Assessment & Plan Note (Deleted)
Continue inhalers including albuterol and Anoro Ellipta.

## 2021-11-10 NOTE — Progress Notes (Signed)
  Progress Note   Patient: Mckenzie Lawrence XBL:[id number] DOB: 02/25/1946 DOA: 11/09/2021     0 DOS: the patient was seen and examined on 11/10/2021     Assessment and Plan: * Acute pancreatitis The patient will continue on IV fluids.  Advance to clear liquid diet.  (The patient does not drink alcohol, gallbladder imaging looks okay, triglycerides normal).  Could be medication related secondary to recent Cipro and/or Crestor.  Will hold Crestor.  Acute lower UTI Continue IV Rocephin.  I messaged on-call urology about outpatient urine culture and they replied back that the culture was negative.  Follow-up urine culture here.  CAD (coronary artery disease) Continue Toprol, aspirin and Plavix.  Hold Crestor.  OSA (obstructive sleep apnea) Continue CPAP nightly.  CKD (chronic kidney disease) stage 4, GFR 15-29 ml/min (HCC) CKD stage IV.  Creatinine 1.91 on presentation down to 1.76 with IV fluids  Chronic obstructive pulmonary disease (COPD) (HCC) Continue inhalers including albuterol and Anoro Ellipta.  Hypertension Continue Toprol losartan and hydralazine  Type 2 diabetes mellitus with hyperlipidemia (HCC) Hold Crestor.  Last hemoglobin A1c low at 6.2.        Subjective: Patient coming in with 4 days of nausea vomiting and unable to keep anything down.  Found to have acute pancreatitis in the emergency room.  Physical Exam: Vitals:   11/10/21 0322 11/10/21 0427 11/10/21 0456 11/10/21 0839  BP: (!) 169/64  (!) 192/63 (!) 171/57  Pulse: 86  85 79  Resp: '19  16 18  '$ Temp:  98.3 F (36.8 C) 98.3 F (36.8 C) 99 F (37.2 C)  TempSrc:  Oral Oral   SpO2: 100%  100% 100%  Weight:   77.4 kg   Height:   '5\' 6"'$  (1.676 m)    Physical Exam HENT:     Head: Normocephalic.     Mouth/Throat:     Pharynx: No oropharyngeal exudate.  Eyes:     General: Lids are normal.     Conjunctiva/sclera: Conjunctivae normal.  Cardiovascular:     Rate and Rhythm: Normal rate and regular rhythm.      Heart sounds: Normal heart sounds, S1 normal and S2 normal.  Pulmonary:     Breath sounds: No decreased breath sounds, wheezing, rhonchi or rales.  Abdominal:     Palpations: Abdomen is soft.     Tenderness: There is abdominal tenderness in the epigastric area.  Musculoskeletal:     Right lower leg: No swelling.     Left lower leg: No swelling.  Skin:    General: Skin is warm.     Findings: No rash.  Neurological:     Mental Status: She is alert and oriented to person, place, and time.    Data Reviewed: Today's creatinine 1.76, sodium 131, white blood cell count 16.2, hemoglobin 10.1, platelet count 304, last hemoglobin A1c 6.2 CT scan of the abdomen pelvis shows very mild peripancreatic stranding along the pancreatic tail suggesting of acute pancreatitis. Ultrasound of the right upper quadrant shows a distended gallbladder without evidence of gallstones or gallbladder wall thickening.  Mildly dilated common bile duct.  Family Communication: Spoke with 3 family members at the bedside  Disposition: Status is: Inpatient Remains inpatient appropriate because: Being treated for acute pancreatitis Planned Discharge Destination: Home  Author: Loletha Grayer, MD 11/10/2021 12:08 PM  For on call review [email or web address].

## 2021-11-10 NOTE — Assessment & Plan Note (Signed)
CKD stage IV.  Creatinine 1.91 on presentation down to 1.76 with IV fluids

## 2021-11-10 NOTE — ED Notes (Signed)
Smith MD made aware of pts high BP readings. No new orders at this time

## 2021-11-10 NOTE — Assessment & Plan Note (Deleted)
- 

## 2021-11-10 NOTE — Assessment & Plan Note (Addendum)
The patient will continue on IV fluids.  Solid food.  Could be medication related secondary to recent Cipro and/or Crestor.  Will hold Crestor.  Patient ate dinner last night without any pain and ate some breakfast this morning.  No pain upon discharge.  Lipase normalized.  If pancreatitis recurs in the future can consider gallbladder as the cause but I suspect this time is likely secondary to medication.

## 2021-11-10 NOTE — H&P (Signed)
[place]   PATIENT NAME: Mckenzie Lawrence    MR#:  [id number]  DATE OF BIRTH:  [birth year]  DATE OF ADMISSION:  11/09/2021  PRIMARY CARE PHYSICIAN: Pcp, No   Patient is coming from: Home  REQUESTING/REFERRING PHYSICIAN: Vladimir Crofts, MD  CHIEF COMPLAINT:   Chief Complaint  Patient presents with   Emesis    HISTORY OF PRESENT ILLNESS:  Mckenzie Lawrence is a 76 y.o. Caucasian female with medical history significant for COPD, CVA, type 2 diabetes mellitus, per lipidemia, OSA on CPAP, who presented today with acute onset of epigastric abdominal pain with associated nausea and vomiting over the last 3 days.  She admitted to diarrhea at the beginning of the week which has resolved.  She denied any fever or chills.  She admits to dysuria and urinary frequency and urgency without hematuria or flank pain.  ED Course: Upon presenting to the emergency room, BP was 103/56 with temperature 97.4 with otherwise normal vital signs.  Labs revealed mild hyponatremia of 131 and a CO2 of 20 with blood glucose 161 with BUN of 32 and creatinine 1.91 close to baseline.  Alkaline phosphatase was 187 albumin 2.9 with a lipase of 182 with total protein 6.  CBC showed leukocytosis 16.9 and anemia close to baseline.  UA showed more than 50 WBCs rare bacteria with her protein and negative nitrite. EKG as reviewed by me : EKG showed normal sinus rhythm with a rate of 99 with premature atrial complexes, right axis deviation, PACs and T wave inversion inferiorly. Imaging: Abdominal pelvic CT scan revealed very mild peripancreatic stranding along the pancreatic tail suggesting acute pancreatitis with no drainable fluid collection or pseudocyst.  Also showed mild thick-walled bladder with perivesical stranding correlating with cystitis.  The patient was given a gram of IV Rocephin 1 L bolus of IV securing good, 10 mg of IV Reglan, 2 mg IV morphine sulfate and 4 mg of IV Zofran.  She will be admitted to a medical bed  for further evaluation and management. PAST MEDICAL HISTORY:   Past Medical History:  Diagnosis Date   Arthritis    Bladder incontinence    Broken foot    Cataracts, bilateral    Chronic kidney insufficiency    COPD (chronic obstructive pulmonary disease) (HCC)    wheezing   CVA (cerebral vascular accident) ([place]   Diabetes mellitus    insulin dependent, Type 2   HLD (hyperlipidemia)    Hx of cardiovascular stress test    a. ETT (6/13):  Ex 5:13; no ischemic changes   Hypertension    controlled on meds   Lacunar stroke of left subthalamic region [facility]) 02/2015   Leg pain    left   Lower back pain    Neuromuscular disorder (HCC)    stroke right hand tingling   Orthostatic hypotension    Osteopenia 01/2017   T score -2.0 stable from prior DEXA   PCOS (polycystic ovarian syndrome)    Personal history of tobacco use, presenting hazards to health 01/09/2015   Sleep apnea    borderline/ CPAP    PAST SURGICAL HISTORY:   Past Surgical History:  Procedure Laterality Date   BROW LIFT Bilateral 11/04/2017   Procedure: BLEPHAROPLASTY UPPER EYELID W/EXCESS SKIN;  Surgeon: Karle Starch, MD;  Location: [place];  Service: Ophthalmology;  Laterality: Bilateral;  DIABETES-insulin dependent uses CPAP   CARDIAC CATHETERIZATION  20 yrs ago   found nothing   CARPAL  TUNNEL RELEASE Bilateral    CATARACT EXTRACTION Bilateral    ELBOW SURGERY Bilateral    ESOPHAGOGASTRODUODENOSCOPY N/A 07/25/2021   Procedure: ESOPHAGOGASTRODUODENOSCOPY (EGD);  Surgeon: [place], Benay Pike, MD;  Location: [facility];  Service: Gastroenterology;  Laterality: N/A;  IDDM   FOOT SURGERY     Groin Abscess     HAND SURGERY     KNEE SURGERY Bilateral    LABIAL ABSCESS     LUMBAR LAMINECTOMY/DECOMPRESSION MICRODISCECTOMY Left 05/11/2018   Procedure: LUMBAR LAMINECTOMY/DECOMPRESSION MICRODISCECTOMY 1 LEVEL- L4-5;  Surgeon: [name], MD;  Location: [facility];  Service: Neurosurgery;  Laterality:  Left;   OOPHORECTOMY     BSO   PUBO VAG SLING     SHOULDER SURGERY     bilateral arthroscopies   VAGINAL HYSTERECTOMY  1979    SOCIAL HISTORY:   Social History   Tobacco Use   Smoking status: Every Day    Packs/day: 1.50    Years: 50.00    Pack years: 75.00    Types: Cigarettes   Smokeless tobacco: Never   Tobacco comments:    3ppd x 10 year, then cut back to 1.5pdd since 02/2015  Substance Use Topics   Alcohol use: Never    Alcohol/week: 0.0 standard drinks    FAMILY HISTORY:   Family History  Problem Relation Age of Onset   Hypertension Mother    Heart disease Mother 73       MI   Diabetes Father    Diabetes Sister    Hypertension Sister    Diabetes Brother    Hypertension Brother    Heart disease Brother 72       CAD   Cancer Sister        Multiple myloma   Diabetes Brother     DRUG ALLERGIES:   Allergies  Allergen Reactions   Iodine Anaphylaxis and Other (See Comments)    Pt states that she is allergic to ingested iodine only, okay for betadine.     Shellfish Allergy Anaphylaxis   Codeine Nausea And Vomiting   Morphine Sulfate Nausea And Vomiting   Irbesartan Other (See Comments)    Reaction:  Unknown  (Avapro)   Sulfa Antibiotics Other (See Comments)    Fever     REVIEW OF SYSTEMS:   ROS As per history of present illness. All pertinent systems were reviewed above. Constitutional, HEENT, cardiovascular, respiratory, GI, GU, musculoskeletal, neuro, psychiatric, endocrine, integumentary and hematologic systems were reviewed and are otherwise negative/unremarkable except for positive findings mentioned above in the HPI.   MEDICATIONS AT HOME:   Prior to Admission medications   Medication Sig Start Date End Date Taking? Authorizing Provider  acetaminophen (TYLENOL) 500 MG tablet Take 1,000 mg by mouth every 6 (six) hours as needed for moderate pain or headache.   Yes [provider]  aspirin EC 81 MG tablet Take 81 mg by mouth daily.  Swallow whole.   Yes [provider]  calcitRIOL (ROCALTROL) 0.25 MCG capsule Take 0.25 mcg by mouth daily. 08/17/21  Yes [provider]  clopidogrel (PLAVIX) 75 MG tablet Take 75 mg by mouth daily.   Yes [provider]  doxycycline (VIBRA-TABS) 100 MG tablet Take 100 mg by mouth 2 (two) times daily. 11/02/21  Yes [provider]  hydrALAZINE (APRESOLINE) 25 MG tablet Take 25 mg by mouth 3 (three) times daily. 07/30/21  Yes [provider]  insulin detemir (LEVEMIR) 100 UNIT/ML injection Inject 30-60 Units into the skin daily.  Yes [provider]  losartan (COZAAR) 100 MG tablet Take 1 tablet (100 mg total) by mouth daily. 09/26/15  Yes Gladstone Lighter, MD  metoprolol succinate (TOPROL-XL) 100 MG 24 hr tablet Take 100 mg by mouth daily.   Yes [provider]  Multiple Vitamins-Minerals (VISION VITAMINS PO) Take 1 capsule by mouth daily. AREDS   Yes [provider]  ondansetron (ZOFRAN-ODT) 8 MG disintegrating tablet Take 8 mg by mouth every 8 (eight) hours as needed for nausea or vomiting.   Yes [provider]  OVER THE COUNTER MEDICATION Take 2 tablets by mouth daily. Cranberry complex   Yes [provider]  pantoprazole (PROTONIX) 40 MG tablet Take 40 mg by mouth daily. 07/06/21  Yes [provider]  rosuvastatin (CRESTOR) 5 MG tablet Take 5 mg by mouth daily. 09/04/21  Yes [provider]  albuterol (PROVENTIL) (2.5 MG/3ML) 0.083% nebulizer solution Take 3 mLs (2.5 mg total) by nebulization every 6 (six) hours as needed for wheezing or shortness of breath. Patient not taking: Reported on 11/10/2021 03/22/21   Serafina Royals, FNP  albuterol (VENTOLIN HFA) 108 (90 Base) MCG/ACT inhaler Inhale 2 puffs into the lungs every 6 (six) hours as needed for wheezing or shortness of breath. Patient not taking: Reported on 11/10/2021 04/09/21   [provider]  Celedonio Miyamoto 62.5-25 MCG/ACT AEPB  SMARTSIG:1 Inhalation Via Inhaler Daily Patient not taking: Reported on 11/10/2021 04/09/21   [provider]  BD INSULIN SYRINGE U/F 31G X 5/16" 1 ML MISC USE 1 SYRINGE AS DIRECTED 07/10/20   [provider]  diazepam (VALIUM) 10 MG tablet Take 10 mg by mouth as needed. Prior to eye injection 10/24/21   [provider]  docusate sodium (COLACE) 100 MG capsule Take 1 capsule (100 mg total) by mouth 2 (two) times daily. Patient not taking: Reported on 11/10/2021 10/11/21   Newman Pies, MD  LORazepam (ATIVAN) 2 MG tablet Take 0.5 mg by mouth every 8 (eight) hours as needed for anxiety. Patient not taking: Reported on 11/10/2021 03/29/20   [provider]  mirtazapine (REMERON) 7.5 MG tablet Take 7.5 mg by mouth at bedtime. 09/03/21   [provider]  [facility] ULTRA test strip 4 (four) times daily. 07/11/20   [provider]  oxybutynin (DITROPAN) 5 MG tablet Take 5 mg by mouth at bedtime.    [provider]  oxyCODONE-acetaminophen (PERCOCET) 5-325 MG tablet Take 1 tablet by mouth every 4 (four) hours as needed for severe pain. Patient not taking: Reported on 11/10/2021 10/11/21 10/11/22  Viona Gilmore D, NP  promethazine (PHENERGAN) 25 MG tablet Take 25 mg by mouth every 6 (six) hours as needed for nausea/vomiting. Patient not taking: Reported on 11/10/2021 08/30/21   [provider]  tamsulosin (FLOMAX) 0.4 MG CAPS capsule Take 1 capsule (0.4 mg total) by mouth daily. Patient not taking: Reported on 11/10/2021 10/11/21   Newman Pies, MD  tiZANidine (ZANAFLEX) 2 MG tablet Take 1 tablet (2 mg total) by mouth every 8 (eight) hours as needed for muscle spasms. Patient not taking: Reported on 11/10/2021 10/11/21   Newman Pies, MD  torsemide (DEMADEX) 20 MG tablet Take 40 mg by mouth daily as needed (fluid).    [provider]      VITAL SIGNS:  Blood pressure (!) 169/64, pulse 86, temperature 98.3 F (36.8 C),  temperature source Oral, resp. rate 19, height '5\' 6"'$  (1.676 m), weight 75.8 kg, SpO2 100 %.  PHYSICAL EXAMINATION:  Physical Exam  GENERAL:  76 y.o.-year-old Caucasian female patient lying in the bed with no acute distress.  EYES: Pupils equal, round, reactive to light and accommodation. No scleral icterus. Extraocular muscles intact.  HEENT: Head atraumatic, normocephalic. Oropharynx and nasopharynx clear.  NECK:  Supple, no jugular venous distention. No thyroid enlargement, no tenderness.  LUNGS: Normal breath sounds bilaterally, no wheezing, rales,rhonchi or crepitation. No use of accessory muscles of respiration.  CARDIOVASCULAR: Regular rate and rhythm, S1, S2 normal. No murmurs, rubs, or gallops.  ABDOMEN: Soft, nondistended with epigastric, left and right upper quadrant abdominal tenderness without returns guarding or rigidity.  Bowel sounds present. No organomegaly or mass.  EXTREMITIES: No pedal edema, cyanosis, or clubbing.  NEUROLOGIC: Cranial nerves II through XII are intact. Muscle strength 5/5 in all extremities. Sensation intact. Gait not checked.  PSYCHIATRIC: The patient is alert and oriented x 3.  Normal affect and good eye contact. SKIN: No obvious rash, lesion, or ulcer.   LABORATORY PANEL:   CBC Recent Labs  Lab 11/09/21 1921  WBC 16.9*  HGB 11.1*  HCT 34.2*  PLT 337   ------------------------------------------------------------------------------------------------------------------  Chemistries  Recent Labs  Lab 11/09/21 1921  NA 131*  K 3.6  CL 101  CO2 20*  GLUCOSE 161*  BUN 32*  CREATININE 1.91*  CALCIUM 9.2  AST 19  ALT 6  ALKPHOS 187*  BILITOT 0.7   ------------------------------------------------------------------------------------------------------------------  Cardiac Enzymes No results for input(s): TROPONINI in the last 168  hours. ------------------------------------------------------------------------------------------------------------------  RADIOLOGY:  CT ABDOMEN PELVIS WO CONTRAST  Result Date: 11/10/2021 CLINICAL DATA:  Pancreatitis, vomiting EXAM: CT ABDOMEN AND PELVIS WITHOUT CONTRAST TECHNIQUE: Multidetector CT imaging of the abdomen and pelvis was performed following the standard protocol without IV contrast. RADIATION DOSE REDUCTION: This exam was performed according to the departmental dose-optimization program which includes automated exposure control, adjustment of the mA and/or kV according to patient size and/or use of iterative reconstruction technique. COMPARISON:  CT abdomen/pelvis dated 03/13/2007 FINDINGS: Lower chest: Lung bases are clear. Hepatobiliary: Unenhanced liver is unremarkable. Mild gallbladder sludge (series 2/image 26), without associated inflammatory changes. No intrahepatic or extrahepatic duct dilatation. Pancreas: Very mild peripancreatic stranding along the pancreatic tail (series 2/image 27), suggesting acute pancreatitis. No drainable fluid collection/pseudocyst. Spleen: Within normal limits. Adrenals/Urinary Tract: Adrenal glands are within normal limits. Kidneys are within normal limits. No renal, ureteral, or bladder calculi. No hydronephrosis. Mildly thick-walled bladder with perivesical stranding. Stomach/Bowel: Stomach is within normal limits. No evidence of bowel obstruction. Normal appendix (series 2/image 4). No colonic wall thickening or inflammatory changes. Vascular/Lymphatic: No evidence of abdominal aortic aneurysm. Atherosclerotic calcifications of the abdominal aorta and branch vessels. No suspicious abdominopelvic lymphadenopathy. Reproductive: Prostate is unremarkable. Other: No abdominopelvic ascites. Musculoskeletal: Status post PLIF at L4-S1. IMPRESSION: Very mild peripancreatic stranding along the pancreatic tail, suggesting acute pancreatitis. No drainable fluid  collection/pseudocyst. Mildly thick-walled bladder with perivesical stranding, correlate for cystitis. Electronically Signed   By: Julian Hy M.D.   On: 11/10/2021 00:02      IMPRESSION AND PLAN:  Assessment and Plan: * Pancreatitis - The patient will be admitted to a medical bed. - Pain management will be provided. - She will be kept NPO except for medications. - We will follow serial lipase levels.  Acute lower UTI - The patient will be placed on IV Rocephin. - We will follow urine culture.  CAD (coronary artery disease) -We will continue aspirin and Plavix.  OSA (obstructive sleep apnea) - We will continue CPAP  nightly.  Diabetes mellitus with renal manifestations, controlled ([place]) - The patient will be placed on supplemental coverage with NovoLog. - We will continue basal coverage.  Dyslipidemia - We will continue statin therapy.  Chronic obstructive pulmonary disease (COPD) (HCC) - We will continue inhalers including albuterol and Anoro Ellipta.  Hypertension - We will continue her antihypertensives.   DVT prophylaxis: Lovenox. Advanced Care Planning:  Code Status: full code. Family Communication:  The plan of care was discussed in details with the patient (and family). I answered all questions. The patient agreed to proceed with the above mentioned plan. Further management will depend upon hospital course. Disposition Plan: Back to previous home environment Consults called: none. All the records are reviewed and case discussed with ED provider.  Status is: Inpatient  At the time of the admission, it appears that the appropriate admission status for this patient is inpatient.  This is judged to be reasonable and necessary in order to provide the required intensity of service to ensure the patient's safety given the presenting symptoms, physical exam findings and initial radiographic and laboratory data in the context of comorbid conditions.  The patient  requires inpatient status due to high intensity of service, high risk of further deterioration and high frequency of surveillance required.  I certify that at the time of admission, it is my clinical judgment that the patient will require inpatient hospital care extending more than 2 midnights.                            Dispo: The patient is from: Home              Anticipated d/c is to: Home              Patient currently is not medically stable to d/c.              Difficult to place patient: No  Christel Mormon M.D on 11/10/2021 at 4:45 AM  [facility]   From 7 PM-7 AM, contact night-coverage [email or web address]  CC: Primary care physician; Pcp, No

## 2021-11-10 NOTE — Plan of Care (Signed)
  Problem: Pain Managment: Goal: General experience of comfort will improve Outcome: Progressing   

## 2021-11-10 NOTE — Progress Notes (Signed)
Anticoagulation monitoring(Lovenox):  76 yo female ordered Lovenox 40 mg Q24h    Filed Weights   11/09/21 1935  Weight: 75.8 kg (167 lb)   BMI  27   Lab Results  Component Value Date   CREATININE 1.91 (H) 11/09/2021   CREATININE 2.01 (H) 10/08/2021   CREATININE 1.94 (H) 10/06/2021   Estimated Creatinine Clearance: 26.9 mL/min (A) (by C-G formula based on SCr of 1.91 mg/dL (H)). Hemoglobin & Hematocrit     Component Value Date/Time   HGB 11.1 (L) 11/09/2021 1921   HGB 13.6 12/01/2011 1656   HCT 34.2 (L) 11/09/2021 1921   HCT 40.9 12/01/2011 1656     Per Protocol for Patient with estCrcl < 30 ml/min and BMI < 30, will transition to Lovenox 30 mg Q24h.

## 2021-11-10 NOTE — TOC Initial Note (Signed)
Transition of Care [facility]) - Initial/Assessment Note    Patient Details  Name: Mckenzie Lawrence MRN: [id number] Date of Birth: 1945-12-08  [facility] CM/SW Contact:    Mckenzie Ivan, LCSW Phone Number: 11/10/2021, 2:50 PM  Clinical Narrative:                CSW completed high risk assessment with patient's spouse. Patient lives with her spouse who provides transportation. PCP is Dr. Kary Kos. Pharmacy [facility]. Patient is active with [facility] (confirmed with Meg). Patient has a RW, cane, and 3in1. TOC to follow.    Expected Discharge Plan: [place] Barriers to Discharge: Continued Medical Work up   Patient Goals and CMS Choice Patient states their goals for this hospitalization and ongoing recovery are:: home with home health CMS Medicare.gov Compare Post Acute Care list provided to:: Patient Represenative (must comment) Choice offered to / list presented to : Spouse  Expected Discharge Plan and Services Expected Discharge Plan: [place]       Living arrangements for the past 2 months: Single Family Home                                      Prior Living Arrangements/Services Living arrangements for the past 2 months: Single Family Home Lives with:: Spouse Patient language and need for interpreter reviewed:: Yes Do you feel safe going back to the place where you live?: Yes      Need for Family Participation in Patient Care: Yes (Comment) Care giver support system in place?: Yes (comment) Current home services: DME Criminal Activity/Legal Involvement Pertinent to Current Situation/Hospitalization: No - Comment as needed  Activities of Daily Living Home Assistive Devices/Equipment: Gilford Rile (specify type) ADL Screening (condition at time of admission) Patient's cognitive ability adequate to safely complete daily activities?: Yes Is the patient deaf or have difficulty hearing?: No Does the patient have  difficulty seeing, even when wearing glasses/contacts?: No Does the patient have difficulty concentrating, remembering, or making decisions?: No Patient able to express need for assistance with ADLs?: Yes Does the patient have difficulty dressing or bathing?: Yes Independently performs ADLs?: No Communication: Independent Dressing (OT): Needs assistance Is this a change from baseline?: Pre-admission baseline Grooming: Needs assistance Is this a change from baseline?: Pre-admission baseline Feeding: Independent Bathing: Needs assistance Is this a change from baseline?: Pre-admission baseline Toileting: Needs assistance Is this a change from baseline?: Pre-admission baseline In/Out Bed: Needs assistance Is this a change from baseline?: Pre-admission baseline Walks in Home: Independent Does the patient have difficulty walking or climbing stairs?: Yes Weakness of Legs: None Weakness of Arms/Hands: None  Permission Sought/Granted Permission sought to share information with : Chartered certified accountant granted to share information with : Yes, Verbal Permission Granted (by spouse)     Permission granted to share info w AGENCY: [place]        Emotional Assessment         Alcohol / Substance Use: Not Applicable Psych Involvement: No (comment)  Admission diagnosis:  Pancreatitis [K85.90] Acute cystitis without hematuria [N30.00] Idiopathic acute pancreatitis without infection or necrosis [K85.00] Nausea and vomiting, unspecified vomiting type [R11.2] Patient Active Problem List   Diagnosis Date Noted   Acute pancreatitis 11/10/2021   Acute lower UTI 11/10/2021   Diabetes mellitus with renal manifestations, controlled ([place]) 11/10/2021   CKD (chronic  kidney disease) stage 4, GFR 15-29 ml/min (HCC) 11/10/2021   Lumbar spinal stenosis 10/01/2021   Situational anxiety 07/08/2019   Chronic kidney disease, stage 3 unspecified ([place]) 03/01/2019   Edema of  lower extremity 03/01/2019   Proteinuria 03/01/2019   Encounter for orthopedic follow-up care 11/04/2018   CAD (coronary artery disease) 10/26/2018   Pain in right hand 07/22/2018   Degeneration of lumbar intervertebral disc 02/06/2018   Scoliosis deformity of spine 02/06/2018   Spondylolisthesis, grade 1 02/06/2018   Low back pain 02/04/2018   Spinal stenosis of lumbar region 02/04/2018   Trigger finger 01/26/2018   CVA (cerebral vascular accident) ([place]) 03/25/2016   OSA (obstructive sleep apnea) 02/15/2016   Hyponatremia 09/25/2015   Vitamin B12 deficiency 09/12/2015   Lacunar stroke of left subthalamic region ([place]) 09/12/2015   Diabetic polyneuropathy associated with diabetes mellitus due to underlying condition ([place]) 09/12/2015   Stroke ([place]) 03/15/2015   Accelerated hypertension 03/15/2015   Chronic obstructive pulmonary disease (COPD) ([place]) 09/24/2013   Vertigo 09/24/2013   Female stress incontinence 02/10/2013   Incomplete emptying of bladder 02/10/2013   Increased frequency of urination 02/10/2013   Urge incontinence 02/10/2013   Syncope 12/04/2011   Tobacco abuse 12/04/2011   Elevated cholesterol    Cataracts, bilateral    Hypertension    Broken foot    PCOS (polycystic ovarian syndrome)    Type 2 diabetes mellitus with hyperlipidemia ([place])    Osteopenia    PCP:  Pcp, No Pharmacy:   [facility], [place] [address] Phone: [phone] Fax: [phone]  [facility] [place], [address] [facility] [address] [place] [address] Phone: [phone] Fax: [phone]     Social Determinants of Health (SDOH) Interventions    Readmission Risk Interventions    11/10/2021    2:49 PM  Readmission Risk Prevention Plan  Transportation Screening Complete  PCP or Specialist Appt within 3-5 Days Complete  HRI or Home Care Consult Complete  Social  Work Consult for [place] Planning/Counseling Complete  Palliative Care Screening Not Applicable  Medication Review Press photographer) Complete

## 2021-11-10 NOTE — Assessment & Plan Note (Addendum)
Continue Toprol, aspirin and Plavix.  Hold Crestor.

## 2021-11-10 NOTE — Assessment & Plan Note (Addendum)
Hold Crestor.  Last hemoglobin A1c low at 6.2.

## 2021-11-11 DIAGNOSIS — K859 Acute pancreatitis without necrosis or infection, unspecified: Secondary | ICD-10-CM | POA: Diagnosis not present

## 2021-11-11 DIAGNOSIS — J449 Chronic obstructive pulmonary disease, unspecified: Secondary | ICD-10-CM | POA: Diagnosis not present

## 2021-11-11 DIAGNOSIS — N3000 Acute cystitis without hematuria: Secondary | ICD-10-CM | POA: Diagnosis not present

## 2021-11-11 DIAGNOSIS — I2510 Atherosclerotic heart disease of native coronary artery without angina pectoris: Secondary | ICD-10-CM | POA: Diagnosis not present

## 2021-11-11 LAB — GLUCOSE, CAPILLARY
Glucose-Capillary: 120 mg/dL — ABNORMAL HIGH (ref 70–99)
Glucose-Capillary: 198 mg/dL — ABNORMAL HIGH (ref 70–99)
Glucose-Capillary: 70 mg/dL (ref 70–99)
Glucose-Capillary: 73 mg/dL (ref 70–99)

## 2021-11-11 LAB — BASIC METABOLIC PANEL
Anion gap: 6 (ref 5–15)
BUN: 30 mg/dL — ABNORMAL HIGH (ref 8–23)
CO2: 19 mmol/L — ABNORMAL LOW (ref 22–32)
Calcium: 8.2 mg/dL — ABNORMAL LOW (ref 8.9–10.3)
Chloride: 110 mmol/L (ref 98–111)
Creatinine, Ser: 1.76 mg/dL — ABNORMAL HIGH (ref 0.44–1.00)
GFR, Estimated: 30 mL/min — ABNORMAL LOW (ref >60)
Glucose, Bld: 113 mg/dL — ABNORMAL HIGH (ref 70–99)
Potassium: 4.4 mmol/L (ref 3.5–5.1)
Sodium: 135 mmol/L (ref 135–145)

## 2021-11-11 LAB — CBC
HCT: 28.8 % — ABNORMAL LOW (ref 36.0–46.0)
Hemoglobin: 9.4 g/dL — ABNORMAL LOW (ref 12.0–15.0)
MCH: 29.8 pg (ref 26.0–34.0)
MCHC: 32.6 g/dL (ref 30.0–36.0)
MCV: 91.4 fL (ref 80.0–100.0)
Platelets: 280 10*3/uL (ref 150–400)
RBC: 3.15 MIL/uL — ABNORMAL LOW (ref 3.87–5.11)
RDW: 14.7 % (ref 11.5–15.5)
WBC: 12.2 10*3/uL — ABNORMAL HIGH (ref 4.0–10.5)
nRBC: 0 % (ref 0.0–0.2)

## 2021-11-11 LAB — LIPASE, BLOOD: Lipase: 74 U/L — ABNORMAL HIGH (ref 11–51)

## 2021-11-11 MED ORDER — GENTAMICIN SULFATE 40 MG/ML IJ SOLN
5.0000 mg/kg | Freq: Once | INTRAVENOUS | Status: AC
  Administered 2021-11-11: 330 mg via INTRAVENOUS
  Filled 2021-11-11: qty 8.25

## 2021-11-11 NOTE — Plan of Care (Signed)
Pt AAOx4, moderate lower back pain, no nausea/vomiting. VS are stable. Plan to advance diet, ambulate and continue fluids. Currently up to chair. Call light within reach. Will continue to monitor.

## 2021-11-11 NOTE — Progress Notes (Signed)
Patient refused restart of IV fluids.

## 2021-11-11 NOTE — Progress Notes (Addendum)
  Progress Note   Patient: Mckenzie Lawrence MWU:[id number] DOB: 1945/10/14 DOA: 11/09/2021     1 DOS: the patient was seen and examined on 11/11/2021     Assessment and Plan: * Acute pancreatitis The patient will continue on IV fluids.  Solid food.  Could be medication related secondary to recent Cipro and/or Crestor.  Will hold Crestor.  Patient only ate some soup for lunch.  We will see how she does with dinner and breakfast.  Acute lower UTI Likely partially treated urinary tract infection.  Morganella growing out of the culture.  Change Rocephin over a one-time dose of gentamicin.  Outpatient urology culture was negative.  CAD (coronary artery disease) Continue Toprol, aspirin and Plavix.  Hold Crestor.  OSA (obstructive sleep apnea) Continue CPAP nightly.  CKD (chronic kidney disease) stage 4, GFR 15-29 ml/min (HCC) CKD stage IV.  Creatinine 1.91 on presentation down to 1.76 with IV fluids  Chronic obstructive pulmonary disease (COPD) (HCC) Continue inhalers including albuterol and Anoro Ellipta.  Hypertension Continue Toprol losartan and hydralazine  Type 2 diabetes mellitus with hyperlipidemia (HCC) Hold Crestor.  Last hemoglobin A1c low at 6.2.  On the lower dose Levemir 30 units daily.  Continue to watch sugars closely        Subjective: Patient seen this morning.  She was feeling better.  No pain.  Still with poor appetite.  Asked to advance her diet but only ate soup for lunch.  Wanting to watch a few more meals here in the hospital prior to disposition.  Admitted with acute pancreatitis.  Physical Exam: Vitals:   11/10/21 1939 11/10/21 2129 11/11/21 0236 11/11/21 0825  BP: (!) 184/58 (!) 149/45 (!) 163/58 (!) 133/47  Pulse: 91 91 94 99  Resp: '17 18 17 16  '$ Temp: 98.4 F (36.9 C)  98.8 F (37.1 C) 98.5 F (36.9 C)  TempSrc: Oral  Oral   SpO2: 99%  99% 100%  Weight:      Height:       Physical Exam HENT:     Head: Normocephalic.     Mouth/Throat:      Pharynx: No oropharyngeal exudate.  Eyes:     General: Lids are normal.     Conjunctiva/sclera: Conjunctivae normal.  Cardiovascular:     Rate and Rhythm: Normal rate and regular rhythm.     Heart sounds: Normal heart sounds, S1 normal and S2 normal.  Pulmonary:     Breath sounds: No decreased breath sounds, wheezing, rhonchi or rales.  Abdominal:     Palpations: Abdomen is soft.     Tenderness: There is no abdominal tenderness.  Musculoskeletal:     Right lower leg: No swelling.     Left lower leg: No swelling.  Skin:    General: Skin is warm.     Findings: No rash.  Neurological:     Mental Status: She is alert and oriented to person, place, and time.    Data Reviewed: Creatinine 1.76, hemoglobin 9.4, white blood cell count 12.2  Family Communication: Spoke with family at the bedside and on the phone this afternoon  Disposition: Status is: Inpatient Remains inpatient appropriate because: With advance diet only ate very little for lunch.  Would like to see a few more meals tolerated prior to disposition. Planned Discharge Destination: Home with Home Health  Author: Loletha Grayer, MD 11/11/2021 2:47 PM  For on call review [email or web address].

## 2021-11-12 DIAGNOSIS — N390 Urinary tract infection, site not specified: Secondary | ICD-10-CM | POA: Diagnosis not present

## 2021-11-12 DIAGNOSIS — G4733 Obstructive sleep apnea (adult) (pediatric): Secondary | ICD-10-CM | POA: Diagnosis not present

## 2021-11-12 DIAGNOSIS — K859 Acute pancreatitis without necrosis or infection, unspecified: Secondary | ICD-10-CM | POA: Diagnosis not present

## 2021-11-12 DIAGNOSIS — I2510 Atherosclerotic heart disease of native coronary artery without angina pectoris: Secondary | ICD-10-CM | POA: Diagnosis not present

## 2021-11-12 LAB — URINE CULTURE: Culture: 10000 — AB

## 2021-11-12 LAB — BASIC METABOLIC PANEL
Anion gap: 8 (ref 5–15)
BUN: 28 mg/dL — ABNORMAL HIGH (ref 8–23)
CO2: 17 mmol/L — ABNORMAL LOW (ref 22–32)
Calcium: 8.4 mg/dL — ABNORMAL LOW (ref 8.9–10.3)
Chloride: 108 mmol/L (ref 98–111)
Creatinine, Ser: 1.62 mg/dL — ABNORMAL HIGH (ref 0.44–1.00)
GFR, Estimated: 33 mL/min — ABNORMAL LOW (ref >60)
Glucose, Bld: 86 mg/dL (ref 70–99)
Potassium: 4.5 mmol/L (ref 3.5–5.1)
Sodium: 133 mmol/L — ABNORMAL LOW (ref 135–145)

## 2021-11-12 LAB — CBC
HCT: 26.4 % — ABNORMAL LOW (ref 36.0–46.0)
Hemoglobin: 8.7 g/dL — ABNORMAL LOW (ref 12.0–15.0)
MCH: 30.2 pg (ref 26.0–34.0)
MCHC: 33 g/dL (ref 30.0–36.0)
MCV: 91.7 fL (ref 80.0–100.0)
Platelets: 242 10*3/uL (ref 150–400)
RBC: 2.88 MIL/uL — ABNORMAL LOW (ref 3.87–5.11)
RDW: 14.6 % (ref 11.5–15.5)
WBC: 10.2 10*3/uL (ref 4.0–10.5)
nRBC: 0 % (ref 0.0–0.2)

## 2021-11-12 LAB — LIPASE, BLOOD: Lipase: 51 U/L (ref 11–51)

## 2021-11-12 LAB — GLUCOSE, CAPILLARY: Glucose-Capillary: 120 mg/dL — ABNORMAL HIGH (ref 70–99)

## 2021-11-12 MED ORDER — ONDANSETRON 8 MG PO TBDP: 8.0000 mg | ORAL_TABLET | Freq: Three times a day (TID) | ORAL | 0 refills | Status: DC | PRN

## 2021-11-12 MED ORDER — INSULIN DETEMIR 100 UNIT/ML ~~LOC~~ SOLN: 30.0000 [IU] | Freq: Every day | SUBCUTANEOUS | 11 refills | Status: DC

## 2021-11-12 NOTE — Discharge Summary (Signed)
Physician Discharge Summary   Patient: Mckenzie Lawrence MRN: [id number] DOB: 1945/07/22  Admit date:     11/09/2021  Discharge date: 11/12/21  Discharge Physician: Loletha Grayer   PCP: Maryland Pink, MD   Recommendations at discharge:   Follow-up PCP 5 days Keep your appointment with urology as outpatient  Discharge Diagnoses: Principal Problem:   Acute pancreatitis Active Problems:   Acute lower UTI   CAD (coronary artery disease)   OSA (obstructive sleep apnea)   Type 2 diabetes mellitus with hyperlipidemia (HCC)   Hypertension   Diabetes mellitus with renal manifestations, controlled (HCC)   CKD (chronic kidney disease) stage 4, GFR 15-29 ml/min (HCC)   Acute cystitis without hematuria    Hospital Course: 76 year old female with past medical history of arthritis, COPD, CVA, type 2 diabetes mellitus, hyperlipidemia, hypertension, recent back surgery presented to the hospital with abdominal pain.  She came into the hospital on 11/09/2021 and discharged on 11/12/2021.  She was diagnosed with acute pancreatitis.  Patient initially was n.p.o. then started on clear liquid diet and then advance to solid food.  Upon discharge she had no further pain.  I am suspecting that this could be secondary to a medication like Cipro and/or Crestor.  The patient does not drink alcohol.  Her gallbladder imaging showed a distended gallbladder without evidence for gallstones or gallbladder wall thickening.  Her triglycerides were within normal range.  If pancreatitis recurs could be secondary to the gallbladder but at this point continue to monitor.  The patient was initially placed on Rocephin for urinary symptoms and the urine analysis that was positive.  I gave 1 dose of gentamicin on 5/28.  The patient was feeling better upon discharge.  And will follow-up with urology as outpatient.  Urine culture grew out only 10,000 colonies of Morganella Morganii.  Her postvoid residual was 0.  Case discussed with  infectious disease specialist and this is likely colonization and not a true infection since symptoms improved without treatment.  Hemoglobin drifted down from 11.1 down to 8.7 with vigorous IV fluid hydration for pancreatitis.  Recommend checking a hemoglobin as outpatient.  No signs of bleeding here in the hospitalization.   Assessment and Plan: * Acute pancreatitis The patient improved from admission.  Patient tolerated solid food upon discharge.  Lipase normalized.  No pain upon discharge.  Likely secondary to Cipro and/or Crestor.  If pancreatitis read occurs can consider reimaging the gallbladder.  Acute lower UTI Only 10,000 colonies of Morganella growing out of the culture.  The patient received antibiotics that would not cover this organism.  Postvoid residual 0.  Likely colonization rather than true infection.  Decided not to treat this upon discharge.  Follow-up with urology as outpatient.  CAD (coronary artery disease) Continue Toprol, aspirin and Plavix.  Hold Crestor.  OSA (obstructive sleep apnea) Continue CPAP nightly.  CKD (chronic kidney disease) stage 4, GFR 15-29 ml/min (HCC) CKD stage IV.  Creatinine 1.91 on presentation down to 1.67 upon discharge with IV fluids.  Can consider starting sodium bicarb as outpatient.  Hypertension Continue Toprol losartan and hydralazine  Type 2 diabetes mellitus with hyperlipidemia (HCC) Hold Crestor.  Last hemoglobin A1c low at 6.2.  On the lower dose Levemir 30 units daily.  Continue to watch sugars closely as outpatient.  Anemia unspecified.  No signs of bleeding.  Likely from vigorous IV fluid hydration.  Recommend checking hemoglobin upon follow-up appointment       Consultants: Case discussed with infectious disease  specialist Procedures performed: None Disposition: Home health Diet recommendation:  Cardiac and Carb modified diet DISCHARGE MEDICATION: Allergies as of 11/12/2021       Reactions   Iodine Anaphylaxis,  Other (See Comments)   Pt states that she is allergic to ingested iodine only, okay for betadine.     Shellfish Allergy Anaphylaxis   Codeine Nausea And Vomiting   Morphine Sulfate Nausea And Vomiting   Irbesartan Other (See Comments)   Reaction:  Unknown  (Avapro)   Sulfa Antibiotics Other (See Comments)   Fever         Medication List     STOP taking these medications    albuterol (2.5 MG/3ML) 0.083% nebulizer solution Commonly known as: PROVENTIL   albuterol 108 (90 Base) MCG/ACT inhaler Commonly known as: VENTOLIN HFA   Anoro Ellipta 62.5-25 MCG/ACT Aepb Generic drug: umeclidinium-vilanterol   diazepam 10 MG tablet Commonly known as: VALIUM   docusate sodium 100 MG capsule Commonly known as: COLACE   doxycycline 100 MG tablet Commonly known as: VIBRA-TABS   LORazepam 2 MG tablet Commonly known as: ATIVAN   oxybutynin 5 MG tablet Commonly known as: DITROPAN   oxyCODONE-acetaminophen 5-325 MG tablet Commonly known as: Percocet   promethazine 25 MG tablet Commonly known as: PHENERGAN   rosuvastatin 5 MG tablet Commonly known as: CRESTOR   tamsulosin 0.4 MG Caps capsule Commonly known as: FLOMAX   tiZANidine 2 MG tablet Commonly known as: ZANAFLEX       TAKE these medications    acetaminophen 500 MG tablet Commonly known as: TYLENOL Take 1,000 mg by mouth every 6 (six) hours as needed for moderate pain or headache.   aspirin EC 81 MG tablet Take 81 mg by mouth daily. Swallow whole.   BD Insulin Syringe U/F 31G X 5/16" 1 ML Misc Generic drug: Insulin Syringe-Needle U-100 USE 1 SYRINGE AS DIRECTED   calcitRIOL 0.25 MCG capsule Commonly known as: ROCALTROL Take 0.25 mcg by mouth daily.   clopidogrel 75 MG tablet Commonly known as: PLAVIX Take 75 mg by mouth daily.   hydrALAZINE 25 MG tablet Commonly known as: APRESOLINE Take 25 mg by mouth 3 (three) times daily.   insulin detemir 100 UNIT/ML injection Commonly known as:  LEVEMIR Inject 0.3-0.6 mLs (30-60 Units total) into the skin daily.   losartan 100 MG tablet Commonly known as: COZAAR Take 1 tablet (100 mg total) by mouth daily.   metoprolol succinate 100 MG 24 hr tablet Commonly known as: TOPROL-XL Take 100 mg by mouth daily.   mirtazapine 7.5 MG tablet Commonly known as: REMERON Take 7.5 mg by mouth at bedtime.   ondansetron 8 MG disintegrating tablet Commonly known as: ZOFRAN-ODT Take 1 tablet (8 mg total) by mouth every 8 (eight) hours as needed for nausea or vomiting.   OneTouch Ultra test strip Generic drug: glucose blood 4 (four) times daily.   OVER THE COUNTER MEDICATION Take 2 tablets by mouth daily. Cranberry complex   pantoprazole 40 MG tablet Commonly known as: PROTONIX Take 40 mg by mouth daily.   torsemide 20 MG tablet Commonly known as: DEMADEX Take 40 mg by mouth daily as needed (fluid).   VISION VITAMINS PO Take 1 capsule by mouth daily. AREDS        Follow-up Information     Maryland Pink, MD Follow up in 5 day(s).   Specialty: Family Medicine Contact information: [address] [place] [address] [phone]  keep your appointment with your urologist Follow up.                 Discharge Exam: Filed Weights   11/09/21 1935 11/10/21 0456  Weight: 75.8 kg 77.4 kg   Physical Exam HENT:     Head: Normocephalic.     Mouth/Throat:     Pharynx: No oropharyngeal exudate.  Eyes:     General: Lids are normal.     Conjunctiva/sclera: Conjunctivae normal.  Cardiovascular:     Rate and Rhythm: Normal rate and regular rhythm.     Heart sounds: Normal heart sounds, S1 normal and S2 normal.  Pulmonary:     Breath sounds: No decreased breath sounds, wheezing, rhonchi or rales.  Abdominal:     Palpations: Abdomen is soft.     Tenderness: There is no abdominal tenderness.  Musculoskeletal:     Right lower leg: No swelling.     Left lower leg: No swelling.  Skin:     General: Skin is warm.     Findings: No rash.  Neurological:     Mental Status: She is alert and oriented to person, place, and time.     Condition at discharge: stable  The results of significant diagnostics from this hospitalization (including imaging, microbiology, ancillary and laboratory) are listed below for reference.   Imaging Studies: CT ABDOMEN PELVIS WO CONTRAST  Result Date: 11/10/2021 CLINICAL DATA:  Pancreatitis, vomiting EXAM: CT ABDOMEN AND PELVIS WITHOUT CONTRAST TECHNIQUE: Multidetector CT imaging of the abdomen and pelvis was performed following the standard protocol without IV contrast. RADIATION DOSE REDUCTION: This exam was performed according to the departmental dose-optimization program which includes automated exposure control, adjustment of the mA and/or kV according to patient size and/or use of iterative reconstruction technique. COMPARISON:  CT abdomen/pelvis dated 03/13/2007 FINDINGS: Lower chest: Lung bases are clear. Hepatobiliary: Unenhanced liver is unremarkable. Mild gallbladder sludge (series 2/image 26), without associated inflammatory changes. No intrahepatic or extrahepatic duct dilatation. Pancreas: Very mild peripancreatic stranding along the pancreatic tail (series 2/image 27), suggesting acute pancreatitis. No drainable fluid collection/pseudocyst. Spleen: Within normal limits. Adrenals/Urinary Tract: Adrenal glands are within normal limits. Kidneys are within normal limits. No renal, ureteral, or bladder calculi. No hydronephrosis. Mildly thick-walled bladder with perivesical stranding. Stomach/Bowel: Stomach is within normal limits. No evidence of bowel obstruction. Normal appendix (series 2/image 4). No colonic wall thickening or inflammatory changes. Vascular/Lymphatic: No evidence of abdominal aortic aneurysm. Atherosclerotic calcifications of the abdominal aorta and branch vessels. No suspicious abdominopelvic lymphadenopathy. Reproductive: Prostate is  unremarkable. Other: No abdominopelvic ascites. Musculoskeletal: Status post PLIF at L4-S1. IMPRESSION: Very mild peripancreatic stranding along the pancreatic tail, suggesting acute pancreatitis. No drainable fluid collection/pseudocyst. Mildly thick-walled bladder with perivesical stranding, correlate for cystitis. Electronically Signed   By: Julian Hy M.D.   On: 11/10/2021 00:02   US Abdomen Limited RUQ (LIVER/GB)  Result Date: 11/10/2021 CLINICAL DATA:  Pancreatitis. EXAM: ULTRASOUND ABDOMEN LIMITED RIGHT UPPER QUADRANT COMPARISON:  CT abdomen/pelvis 11/09/2021 FINDINGS: Gallbladder: Gallbladder is distended without evidence of gallstones. No sonographic Murphy sign. Gallbladder wall thickness 2 mm. Common bile duct: Diameter: 8 mm. Liver: Diffusely increased echogenicity without focal mass lesion evident. Portal vein is patent on color Doppler imaging with normal direction of blood flow towards the liver. Other: None. IMPRESSION: 1. Distended gallbladder without evidence for gallstones or gallbladder wall thickening. 2. Mildly dilated common bile duct. Correlation with liver function test recommended. Electronically Signed   By: Verda Cumins.D.  On: 11/10/2021 09:52    Microbiology: Results for orders placed or performed during the hospital encounter of 11/09/21  Urine Culture     Status: Abnormal   Collection Time: 11/10/21 12:54 AM   Specimen: Urine, Catheterized  Result Value Ref Range Status   Specimen Description   Final    URINE, CATHETERIZED Performed at [facility], [address] [place], [place] [address]    Special Requests   Final    NONE Performed at [facility], [address] [place], [place] [address]    Culture 10,000 COLONIES/mL [facility] MORGANII (A)  Final   Report Status 11/12/2021 FINAL  Final   Organism ID, Bacteria MORGANELLA MORGANII (A)  Final      Susceptibility   Morganella morganii - MIC*    AMPICILLIN >=32 RESISTANT  Resistant     CEFAZOLIN >=64 RESISTANT Resistant     CIPROFLOXACIN 1 RESISTANT Resistant     GENTAMICIN >=16 RESISTANT Resistant     IMIPENEM 2 SENSITIVE Sensitive     NITROFURANTOIN RESISTANT Resistant     TRIMETH/SULFA >=320 RESISTANT Resistant     AMPICILLIN/SULBACTAM >=32 RESISTANT Resistant     PIP/TAZO <=4 SENSITIVE Sensitive     * 10,000 COLONIES/mL MORGANELLA MORGANII    Labs: CBC: Recent Labs  Lab 11/09/21 1921 11/10/21 0530 11/11/21 0425 11/12/21 0516  WBC 16.9* 16.2* 12.2* 10.2  HGB 11.1* 10.1* 9.4* 8.7*  HCT 34.2* 30.2* 28.8* 26.4*  MCV 91.2 89.1 91.4 91.7  PLT 337 304 280 626   Basic Metabolic Panel: Recent Labs  Lab 11/09/21 1921 11/10/21 0530 11/11/21 0425 11/12/21 0516  NA 131* 131* 135 133*  K 3.6 3.7 4.4 4.5  CL 101 105 110 108  CO2 20* 18* 19* 17*  GLUCOSE 161* 92 113* 86  BUN 32* 34* 30* 28*  CREATININE 1.91* 1.76* 1.76* 1.62*  CALCIUM 9.2 9.0 8.2* 8.4*   Liver Function Tests: Recent Labs  Lab 11/09/21 1921  AST 19  ALT 6  ALKPHOS 187*  BILITOT 0.7  PROT 6.0*  ALBUMIN 2.9*   CBG: Recent Labs  Lab 11/11/21 0828 11/11/21 1205 11/11/21 1739 11/11/21 2057 11/12/21 0731  GLUCAP 120* 198* 70 73 120*    Discharge time spent: greater than 30 minutes.  Signed: Loletha Grayer, MD [facility] 11/12/2021

## 2021-11-12 NOTE — Plan of Care (Signed)
AVS and education provided. IV removed with no complications. VS are stable. Pt transported by [facility] to main lobby where family will transport her home.

## 2021-11-12 NOTE — Plan of Care (Signed)
Pt AAOx4, no pain. VS are stable. Plan for ambulation and tolerating diet. Currently up to chair. Call light within reach. Will continue to monitor.

## 2021-11-12 NOTE — TOC Transition Note (Signed)
Transition of Care [facility] - CM/SW Discharge Note   Patient Details  Name: Mckenzie Lawrence MRN: [id number] Date of Birth: 09-30-1945  Transition of [facility]) CM/SW Contact:  Candie Chroman, LCSW Phone Number: 11/12/2021, 10:13 AM   Clinical Narrative:   Patient has orders to discharge home today. [place] representative is aware. No further concerns. CSW signing off.  Final next level of care: [place] Barriers to Discharge: Barriers Resolved   Patient Goals and CMS Choice Patient states their goals for this hospitalization and ongoing recovery are:: home with home health CMS Medicare.gov Compare Post Acute Care list provided to:: Patient Represenative (must comment) Choice offered to / list presented to : Spouse  Discharge Placement                    Patient and family notified of of transfer: 11/12/21  Discharge Plan and Services                          HH Arranged: PT [facility] Agency: [place] Date [place]: 11/12/21   Representative spoke with at [place]: Bobbe Medico  Social Determinants of Health ([place]) Interventions     Readmission Risk Interventions    11/10/2021    2:49 PM  Readmission Risk Prevention Plan  Transportation Screening Complete  PCP or Specialist Appt within 3-5 Days Complete  HRI or [place] Complete  Social Work Consult for [place] Planning/Counseling Complete  Palliative Care Screening Not Applicable  Medication Review Press photographer) Complete

## 2021-11-15 DIAGNOSIS — R2681 Unsteadiness on feet: Secondary | ICD-10-CM | POA: Diagnosis not present

## 2021-11-15 DIAGNOSIS — I10 Essential (primary) hypertension: Secondary | ICD-10-CM | POA: Diagnosis not present

## 2021-11-15 DIAGNOSIS — N3946 Mixed incontinence: Secondary | ICD-10-CM | POA: Diagnosis not present

## 2021-11-15 DIAGNOSIS — Z4789 Encounter for other orthopedic aftercare: Secondary | ICD-10-CM | POA: Diagnosis not present

## 2021-11-15 DIAGNOSIS — N3020 Other chronic cystitis without hematuria: Secondary | ICD-10-CM | POA: Diagnosis not present

## 2021-11-15 DIAGNOSIS — D649 Anemia, unspecified: Secondary | ICD-10-CM | POA: Diagnosis not present

## 2021-11-15 DIAGNOSIS — E119 Type 2 diabetes mellitus without complications: Secondary | ICD-10-CM | POA: Diagnosis not present

## 2021-11-19 DIAGNOSIS — H353211 Exudative age-related macular degeneration, right eye, with active choroidal neovascularization: Secondary | ICD-10-CM | POA: Diagnosis not present

## 2021-11-19 DIAGNOSIS — H43813 Vitreous degeneration, bilateral: Secondary | ICD-10-CM | POA: Diagnosis not present

## 2021-11-19 DIAGNOSIS — H353221 Exudative age-related macular degeneration, left eye, with active choroidal neovascularization: Secondary | ICD-10-CM | POA: Diagnosis not present

## 2021-11-19 DIAGNOSIS — H04321 Acute dacryocystitis of right lacrimal passage: Secondary | ICD-10-CM | POA: Diagnosis not present

## 2021-11-21 DIAGNOSIS — R350 Frequency of micturition: Secondary | ICD-10-CM | POA: Diagnosis not present

## 2021-11-21 DIAGNOSIS — K5900 Constipation, unspecified: Secondary | ICD-10-CM | POA: Diagnosis not present

## 2021-11-21 DIAGNOSIS — R300 Dysuria: Secondary | ICD-10-CM | POA: Diagnosis not present

## 2021-11-21 DIAGNOSIS — K859 Acute pancreatitis without necrosis or infection, unspecified: Secondary | ICD-10-CM | POA: Diagnosis not present

## 2021-11-26 DIAGNOSIS — R300 Dysuria: Secondary | ICD-10-CM | POA: Diagnosis not present

## 2021-11-26 DIAGNOSIS — N3020 Other chronic cystitis without hematuria: Secondary | ICD-10-CM | POA: Diagnosis not present

## 2021-12-03 DIAGNOSIS — R7989 Other specified abnormal findings of blood chemistry: Secondary | ICD-10-CM | POA: Diagnosis not present

## 2021-12-03 DIAGNOSIS — K859 Acute pancreatitis without necrosis or infection, unspecified: Secondary | ICD-10-CM | POA: Diagnosis not present

## 2021-12-03 DIAGNOSIS — D649 Anemia, unspecified: Secondary | ICD-10-CM | POA: Diagnosis not present

## 2021-12-10 DIAGNOSIS — R8271 Bacteriuria: Secondary | ICD-10-CM | POA: Diagnosis not present

## 2021-12-10 DIAGNOSIS — E871 Hypo-osmolality and hyponatremia: Secondary | ICD-10-CM | POA: Diagnosis not present

## 2021-12-10 DIAGNOSIS — N3000 Acute cystitis without hematuria: Secondary | ICD-10-CM | POA: Diagnosis not present

## 2021-12-14 DIAGNOSIS — K219 Gastro-esophageal reflux disease without esophagitis: Secondary | ICD-10-CM | POA: Diagnosis not present

## 2021-12-14 DIAGNOSIS — R1310 Dysphagia, unspecified: Secondary | ICD-10-CM | POA: Diagnosis not present

## 2021-12-17 DIAGNOSIS — N184 Chronic kidney disease, stage 4 (severe): Secondary | ICD-10-CM | POA: Diagnosis not present

## 2021-12-17 DIAGNOSIS — E1122 Type 2 diabetes mellitus with diabetic chronic kidney disease: Secondary | ICD-10-CM | POA: Diagnosis not present

## 2021-12-19 DIAGNOSIS — E871 Hypo-osmolality and hyponatremia: Secondary | ICD-10-CM | POA: Diagnosis not present

## 2021-12-20 DIAGNOSIS — I2510 Atherosclerotic heart disease of native coronary artery without angina pectoris: Secondary | ICD-10-CM | POA: Diagnosis not present

## 2021-12-20 DIAGNOSIS — G4733 Obstructive sleep apnea (adult) (pediatric): Secondary | ICD-10-CM | POA: Diagnosis not present

## 2021-12-20 DIAGNOSIS — J449 Chronic obstructive pulmonary disease, unspecified: Secondary | ICD-10-CM | POA: Diagnosis not present

## 2021-12-20 DIAGNOSIS — I10 Essential (primary) hypertension: Secondary | ICD-10-CM | POA: Diagnosis not present

## 2021-12-20 DIAGNOSIS — E785 Hyperlipidemia, unspecified: Secondary | ICD-10-CM | POA: Diagnosis not present

## 2021-12-20 DIAGNOSIS — Z720 Tobacco use: Secondary | ICD-10-CM | POA: Diagnosis not present

## 2021-12-20 DIAGNOSIS — I639 Cerebral infarction, unspecified: Secondary | ICD-10-CM | POA: Diagnosis not present

## 2021-12-20 DIAGNOSIS — Z9989 Dependence on other enabling machines and devices: Secondary | ICD-10-CM | POA: Diagnosis not present

## 2021-12-20 DIAGNOSIS — R600 Localized edema: Secondary | ICD-10-CM | POA: Diagnosis not present

## 2021-12-20 DIAGNOSIS — N1832 Chronic kidney disease, stage 3b: Secondary | ICD-10-CM | POA: Diagnosis not present

## 2021-12-21 DIAGNOSIS — M545 Low back pain, unspecified: Secondary | ICD-10-CM | POA: Diagnosis not present

## 2021-12-21 DIAGNOSIS — R102 Pelvic and perineal pain: Secondary | ICD-10-CM | POA: Diagnosis not present

## 2021-12-21 DIAGNOSIS — Z0289 Encounter for other administrative examinations: Secondary | ICD-10-CM | POA: Diagnosis not present

## 2021-12-21 DIAGNOSIS — R300 Dysuria: Secondary | ICD-10-CM | POA: Diagnosis not present

## 2021-12-21 DIAGNOSIS — N3020 Other chronic cystitis without hematuria: Secondary | ICD-10-CM | POA: Diagnosis not present

## 2021-12-25 DIAGNOSIS — D631 Anemia in chronic kidney disease: Secondary | ICD-10-CM | POA: Diagnosis not present

## 2021-12-25 DIAGNOSIS — N2581 Secondary hyperparathyroidism of renal origin: Secondary | ICD-10-CM | POA: Diagnosis not present

## 2021-12-25 DIAGNOSIS — I10 Essential (primary) hypertension: Secondary | ICD-10-CM | POA: Diagnosis not present

## 2021-12-25 DIAGNOSIS — E1122 Type 2 diabetes mellitus with diabetic chronic kidney disease: Secondary | ICD-10-CM | POA: Diagnosis not present

## 2021-12-25 DIAGNOSIS — N1832 Chronic kidney disease, stage 3b: Secondary | ICD-10-CM | POA: Diagnosis not present

## 2021-12-25 DIAGNOSIS — E871 Hypo-osmolality and hyponatremia: Secondary | ICD-10-CM | POA: Diagnosis not present

## 2021-12-27 DIAGNOSIS — N3020 Other chronic cystitis without hematuria: Secondary | ICD-10-CM | POA: Diagnosis not present

## 2021-12-28 DIAGNOSIS — E871 Hypo-osmolality and hyponatremia: Secondary | ICD-10-CM | POA: Diagnosis not present

## 2021-12-28 DIAGNOSIS — L239 Allergic contact dermatitis, unspecified cause: Secondary | ICD-10-CM | POA: Diagnosis not present

## 2021-12-31 ENCOUNTER — Ambulatory Visit (INDEPENDENT_AMBULATORY_CARE_PROVIDER_SITE_OTHER): Payer: PPO | Admitting: Podiatry

## 2021-12-31 ENCOUNTER — Encounter: Payer: Self-pay | Admitting: Podiatry

## 2021-12-31 DIAGNOSIS — D689 Coagulation defect, unspecified: Secondary | ICD-10-CM | POA: Diagnosis not present

## 2021-12-31 DIAGNOSIS — M79676 Pain in unspecified toe(s): Secondary | ICD-10-CM

## 2021-12-31 DIAGNOSIS — B351 Tinea unguium: Secondary | ICD-10-CM

## 2021-12-31 DIAGNOSIS — E1142 Type 2 diabetes mellitus with diabetic polyneuropathy: Secondary | ICD-10-CM

## 2021-12-31 NOTE — Progress Notes (Signed)
She presents today chief complaint of painful elongated toenails.  Objective: Pulses remain palpable no change in neurovascular status.  She retains neuropathy.  Toenails are long thick yellow dystrophic onychomycotic painful palpation of debridement.  Assessment: Pain in limb secondary to onychomycosis.  Plan: Debridement of toenails 1 through 5 bilateral.  Follow-up with her in 2 to 3 months

## 2022-01-01 DIAGNOSIS — N1832 Chronic kidney disease, stage 3b: Secondary | ICD-10-CM | POA: Diagnosis not present

## 2022-01-01 DIAGNOSIS — K219 Gastro-esophageal reflux disease without esophagitis: Secondary | ICD-10-CM | POA: Diagnosis not present

## 2022-01-01 DIAGNOSIS — R21 Rash and other nonspecific skin eruption: Secondary | ICD-10-CM | POA: Diagnosis not present

## 2022-01-01 DIAGNOSIS — J449 Chronic obstructive pulmonary disease, unspecified: Secondary | ICD-10-CM | POA: Diagnosis not present

## 2022-01-01 DIAGNOSIS — E119 Type 2 diabetes mellitus without complications: Secondary | ICD-10-CM | POA: Diagnosis not present

## 2022-01-01 DIAGNOSIS — R600 Localized edema: Secondary | ICD-10-CM | POA: Diagnosis not present

## 2022-01-01 DIAGNOSIS — M545 Low back pain, unspecified: Secondary | ICD-10-CM | POA: Diagnosis not present

## 2022-01-01 DIAGNOSIS — I10 Essential (primary) hypertension: Secondary | ICD-10-CM | POA: Diagnosis not present

## 2022-01-03 ENCOUNTER — Other Ambulatory Visit: Payer: Self-pay | Admitting: Nephrology

## 2022-01-03 ENCOUNTER — Other Ambulatory Visit (HOSPITAL_COMMUNITY): Payer: Self-pay | Admitting: Nephrology

## 2022-01-03 DIAGNOSIS — N3000 Acute cystitis without hematuria: Secondary | ICD-10-CM | POA: Diagnosis not present

## 2022-01-03 DIAGNOSIS — E871 Hypo-osmolality and hyponatremia: Secondary | ICD-10-CM

## 2022-01-03 DIAGNOSIS — M4316 Spondylolisthesis, lumbar region: Secondary | ICD-10-CM | POA: Diagnosis not present

## 2022-01-03 DIAGNOSIS — M48062 Spinal stenosis, lumbar region with neurogenic claudication: Secondary | ICD-10-CM | POA: Diagnosis not present

## 2022-01-03 DIAGNOSIS — Z6826 Body mass index (BMI) 26.0-26.9, adult: Secondary | ICD-10-CM | POA: Diagnosis not present

## 2022-01-04 DIAGNOSIS — M545 Low back pain, unspecified: Secondary | ICD-10-CM | POA: Diagnosis not present

## 2022-01-07 DIAGNOSIS — M545 Low back pain, unspecified: Secondary | ICD-10-CM | POA: Diagnosis not present

## 2022-01-10 ENCOUNTER — Other Ambulatory Visit: Payer: Self-pay | Admitting: Gastroenterology

## 2022-01-10 DIAGNOSIS — N3020 Other chronic cystitis without hematuria: Secondary | ICD-10-CM | POA: Diagnosis not present

## 2022-01-10 DIAGNOSIS — R1310 Dysphagia, unspecified: Secondary | ICD-10-CM | POA: Diagnosis not present

## 2022-01-11 ENCOUNTER — Ambulatory Visit: Payer: PPO

## 2022-01-11 ENCOUNTER — Encounter (HOSPITAL_COMMUNITY): Payer: Self-pay | Admitting: Gastroenterology

## 2022-01-11 DIAGNOSIS — M545 Low back pain, unspecified: Secondary | ICD-10-CM | POA: Diagnosis not present

## 2022-01-14 ENCOUNTER — Ambulatory Visit
Admission: RE | Admit: 2022-01-14 | Discharge: 2022-01-14 | Disposition: A | Discharge status: Home / Self Care | Payer: HMO | Source: Ambulatory Visit | Attending: Nephrology | Admitting: Nephrology

## 2022-01-14 DIAGNOSIS — M545 Low back pain, unspecified: Secondary | ICD-10-CM | POA: Diagnosis not present

## 2022-01-14 DIAGNOSIS — I700 Atherosclerosis of aorta: Secondary | ICD-10-CM | POA: Insufficient documentation

## 2022-01-14 DIAGNOSIS — J439 Emphysema, unspecified: Secondary | ICD-10-CM | POA: Diagnosis not present

## 2022-01-14 DIAGNOSIS — R918 Other nonspecific abnormal finding of lung field: Secondary | ICD-10-CM | POA: Diagnosis not present

## 2022-01-14 DIAGNOSIS — E871 Hypo-osmolality and hyponatremia: Secondary | ICD-10-CM | POA: Insufficient documentation

## 2022-01-14 DIAGNOSIS — I2510 Atherosclerotic heart disease of native coronary artery without angina pectoris: Secondary | ICD-10-CM | POA: Diagnosis not present

## 2022-01-14 NOTE — Anesthesia Preprocedure Evaluation (Addendum)
Anesthesia Evaluation  Patient identified by MRN, date of birth, ID band Patient awake    Reviewed: Allergy & Precautions, NPO status , Patient's Chart, lab work & pertinent test results  Airway Mallampati: III  TM Distance: >3 FB Neck ROM: Full    Dental no notable dental hx. (+) Implants, Dental Advisory Given   Pulmonary sleep apnea and Continuous Positive Airway Pressure Ventilation , Current SmokerPatient did not abstain from smoking.,    Pulmonary exam normal breath sounds clear to auscultation       Cardiovascular hypertension, Normal cardiovascular exam Rhythm:Regular Rate:Normal     Neuro/Psych Anxiety R side weaker than L  CVA    GI/Hepatic   Endo/Other  diabetes, Type 2  Renal/GU      Musculoskeletal  (+) Arthritis ,   Abdominal   Peds  Hematology   Anesthesia Other Findings ALL: codiene, Mso4, Sulfa, Irbesartan  Reproductive/Obstetrics                           Anesthesia Physical Anesthesia Plan  ASA: 3  Anesthesia Plan: MAC   Post-op Pain Management:    Induction: Intravenous  PONV Risk Score and Plan: Treatment may vary due to age or medical condition  Airway Management Planned: Natural Airway and Nasal Cannula  Additional Equipment: None  Intra-op Plan:   Post-operative Plan:   Informed Consent: I have reviewed the patients History and Physical, chart, labs and discussed the procedure including the risks, benefits and alternatives for the proposed anesthesia with the patient or authorized representative who has indicated his/her understanding and acceptance.     Dental advisory given  Plan Discussed with:   Anesthesia Plan Comments: (EGD Dysphagia )       Anesthesia Quick Evaluation

## 2022-01-15 ENCOUNTER — Encounter (HOSPITAL_COMMUNITY): Payer: Self-pay | Admitting: Gastroenterology

## 2022-01-15 ENCOUNTER — Ambulatory Visit (HOSPITAL_BASED_OUTPATIENT_CLINIC_OR_DEPARTMENT_OTHER): Payer: HMO | Admitting: Anesthesiology

## 2022-01-15 ENCOUNTER — Ambulatory Visit (HOSPITAL_COMMUNITY): Payer: HMO | Admitting: Anesthesiology

## 2022-01-15 ENCOUNTER — Ambulatory Visit (HOSPITAL_COMMUNITY)
Admission: RE | Admit: 2022-01-15 | Discharge: 2022-01-15 | Disposition: A | Discharge status: Home / Self Care | Payer: HMO | Source: Ambulatory Visit | Attending: Gastroenterology | Admitting: Gastroenterology

## 2022-01-15 ENCOUNTER — Encounter (HOSPITAL_COMMUNITY)
Admission: RE | Disposition: A | Discharge status: Home / Self Care | Payer: Self-pay | Source: Ambulatory Visit | Attending: Gastroenterology

## 2022-01-15 DIAGNOSIS — R1310 Dysphagia, unspecified: Secondary | ICD-10-CM | POA: Insufficient documentation

## 2022-01-15 DIAGNOSIS — Q396 Congenital diverticulum of esophagus: Secondary | ICD-10-CM

## 2022-01-15 DIAGNOSIS — F17200 Nicotine dependence, unspecified, uncomplicated: Secondary | ICD-10-CM | POA: Insufficient documentation

## 2022-01-15 DIAGNOSIS — I10 Essential (primary) hypertension: Secondary | ICD-10-CM | POA: Diagnosis not present

## 2022-01-15 DIAGNOSIS — M1990 Unspecified osteoarthritis, unspecified site: Secondary | ICD-10-CM | POA: Diagnosis not present

## 2022-01-15 DIAGNOSIS — K449 Diaphragmatic hernia without obstruction or gangrene: Secondary | ICD-10-CM | POA: Insufficient documentation

## 2022-01-15 DIAGNOSIS — G4730 Sleep apnea, unspecified: Secondary | ICD-10-CM | POA: Insufficient documentation

## 2022-01-15 DIAGNOSIS — K5710 Diverticulosis of small intestine without perforation or abscess without bleeding: Secondary | ICD-10-CM | POA: Diagnosis not present

## 2022-01-15 DIAGNOSIS — E119 Type 2 diabetes mellitus without complications: Secondary | ICD-10-CM | POA: Insufficient documentation

## 2022-01-15 DIAGNOSIS — Z7902 Long term (current) use of antithrombotics/antiplatelets: Secondary | ICD-10-CM | POA: Insufficient documentation

## 2022-01-15 DIAGNOSIS — F17210 Nicotine dependence, cigarettes, uncomplicated: Secondary | ICD-10-CM

## 2022-01-15 DIAGNOSIS — K2940 Chronic atrophic gastritis without bleeding: Secondary | ICD-10-CM | POA: Insufficient documentation

## 2022-01-15 DIAGNOSIS — F419 Anxiety disorder, unspecified: Secondary | ICD-10-CM | POA: Diagnosis not present

## 2022-01-15 HISTORY — PX: BOTOX INJECTION: SHX5754

## 2022-01-15 HISTORY — PX: ESOPHAGOGASTRODUODENOSCOPY: SHX5428

## 2022-01-15 LAB — GLUCOSE, CAPILLARY: Glucose-Capillary: 124 mg/dL — ABNORMAL HIGH (ref 70–99)

## 2022-01-15 SURGERY — EGD (ESOPHAGOGASTRODUODENOSCOPY)
Anesthesia: Monitor Anesthesia Care

## 2022-01-15 MED ORDER — PROPOFOL 10 MG/ML IV BOLUS
INTRAVENOUS | Status: DC | PRN
  Administered 2022-01-15 (×4): 40 mg via INTRAVENOUS
  Administered 2022-01-15: 60 mg via INTRAVENOUS

## 2022-01-15 MED ORDER — SODIUM CHLORIDE 0.9 % IV SOLN: INTRAVENOUS | Status: DC

## 2022-01-15 MED ORDER — ONABOTULINUMTOXINA 100 UNITS IJ SOLR
INTRAMUSCULAR | Status: AC
  Filled 2022-01-15: qty 100

## 2022-01-15 MED ORDER — LACTATED RINGERS IV SOLN: INTRAVENOUS | Status: DC | PRN

## 2022-01-15 MED ORDER — LABETALOL HCL 5 MG/ML IV SOLN
5.0000 mg | Freq: Once | INTRAVENOUS | Status: AC
  Administered 2022-01-15: 5 mg via INTRAVENOUS

## 2022-01-15 MED ORDER — LABETALOL HCL 5 MG/ML IV SOLN
INTRAVENOUS | Status: AC
  Filled 2022-01-15: qty 4

## 2022-01-15 MED ORDER — SODIUM CHLORIDE (PF) 0.9 % IJ SOLN
INTRAMUSCULAR | Status: DC | PRN
  Administered 2022-01-15: 4 mL via SUBMUCOSAL

## 2022-01-15 MED ORDER — LIDOCAINE 2% (20 MG/ML) 5 ML SYRINGE
INTRAMUSCULAR | Status: DC | PRN
  Administered 2022-01-15: 80 mg via INTRAVENOUS

## 2022-01-15 MED ORDER — SODIUM CHLORIDE (PF) 0.9 % IJ SOLN
INTRAMUSCULAR | Status: AC
  Filled 2022-01-15: qty 10

## 2022-01-15 NOTE — Anesthesia Postprocedure Evaluation (Signed)
Anesthesia Post Note  Patient: Mckenzie Lawrence  Procedure(s) Performed: ESOPHAGOGASTRODUODENOSCOPY (EGD) BOTOX INJECTION     Patient location during evaluation: Endoscopy Anesthesia Type: MAC Level of consciousness: awake and alert Pain management: pain level controlled Vital Signs Assessment: post-procedure vital signs reviewed and stable Respiratory status: spontaneous breathing, nonlabored ventilation, respiratory function stable and patient connected to nasal cannula oxygen Cardiovascular status: blood pressure returned to baseline and stable Postop Assessment: no apparent nausea or vomiting Anesthetic complications: no   No notable events documented.  Last Vitals:  Vitals:   01/15/22 1250 01/15/22 1300  BP: (!) 180/56 (!) 207/65  Pulse: 71 70  Resp: 20 18  Temp:    SpO2: 100% 99%    Last Pain:  Vitals:   01/15/22 1300  TempSrc:   PainSc: 0-No pain                 Barnet [place]

## 2022-01-15 NOTE — Op Note (Signed)
[facility] Patient Name: Mckenzie Lawrence Procedure Date: 01/15/2022 MRN: [id number] Attending MD: Clarene Essex , MD Date of Birth: 05-04-1946 CSN: [id number] Age: 76 Admit Type: Outpatient Procedure:                Upper GI endoscopy Indications:              Dysphagia Providers:                Clarene Essex, MD, Burtis Junes, RN, Cherylynn Ridges,                            Technician, Marla Roe, CRNA Referring MD:              Medicines:                Monitored Anesthesia Care Complications:            No immediate complications. Estimated Blood Loss:     Estimated blood loss: none. Procedure:                Pre-Anesthesia Assessment:                           - Prior to the procedure, a History and Physical                            was performed, and patient medications and                            allergies were reviewed. The patient's tolerance of                            previous anesthesia was also reviewed. The risks                            and benefits of the procedure and the sedation                            options and risks were discussed with the patient.                            All questions were answered, and informed consent                            was obtained. Prior Anticoagulants: The patient has                            taken Plavix (clopidogrel), last dose was 5 days                            prior to procedure. ASA Grade Assessment: III - A                            patient with severe systemic disease. After  reviewing the risks and benefits, the patient was                            deemed in satisfactory condition to undergo the                            procedure.                           After obtaining informed consent, the endoscope was                            passed under direct vision. Throughout the                            procedure, the patient's blood pressure, pulse, and                             oxygen saturations were monitored continuously. The                            [id number]-H190 ([id number]) Olympus endoscope was introduced                            through the mouth, and advanced to the second part                            of duodenum. The upper GI endoscopy was                            accomplished without difficulty. The patient                            tolerated the procedure well. Scope In: Scope Out: Findings:      A Tiny hiatal hernia was present.      A diverticulum with a small opening was found in the lower third of the       esophagus.      A hypertonic lower esophageal sphincter was found. There was mild       resistance to endoscope advancement into the stomach. The Z-line was       regular. The gastroesophageal junction and cardia were normal on       retroflexed view. Area was successfully injected with 100 units       botulinum toxin.      Diffuse mild inflammation characterized by congestion (edema) was found       in the entire examined stomach.      The duodenal bulb, first portion of the duodenum and second portion of       the duodenum were normal.      The exam was otherwise without abnormality. Impression:               - Tiny hiatal hernia.                           - Diverticulum in the lower third of the esophagus.                           -  The examination was suspicious for achalasia.                            Injected with botulinum toxin.                           - Chronic atrophic gastritis.                           - Normal duodenal bulb, first portion of the                            duodenum and second portion of the duodenum.                           - The examination was otherwise normal.                           - No specimens collected. Moderate Sedation:      Not Applicable - Patient had care per Anesthesia. Recommendation:           - Patient has a contact number available for                             emergencies. The signs and symptoms of potential                            delayed complications were discussed with the                            patient. Return to normal activities tomorrow.                            Written discharge instructions were provided to the                            patient.                           - Clear liquid diet for 2 hours. If doing well may                            have soft solids later today and slowly advance                            tomorrow                           - Continue present medications.                           - Resume Plavix (clopidogrel) at prior dose                            tomorrow. If doing well tomorrow                           -  Return to GI clinic PRN.                           - Telephone GI clinic if symptomatic PRN. Otherwise                            call in 1 to 2 weeks with update and we will decide                            follow-up at that point Procedure Code(s):        --- Professional ---                           [phone], Esophagogastroduodenoscopy, flexible,                            transoral; with directed submucosal injection(s),                            any substance Diagnosis Code(s):        --- Professional ---                           K44.9, Diaphragmatic hernia without obstruction or                            gangrene                           Q39.6, Congenital diverticulum of esophagus                           K29.40, Chronic atrophic gastritis without bleeding                           R13.10, Dysphagia, unspecified CPT copyright 2019 [employer]. All rights reserved. The codes documented in this report are preliminary and upon coder review may  be revised to meet current compliance requirements. Clarene Essex, MD 01/15/2022 12:53:13 PM This report has been signed electronically. Number of Addenda: 0

## 2022-01-15 NOTE — Transfer of Care (Signed)
Immediate Anesthesia Transfer of Care Note  Patient: Mckenzie Lawrence Nong  Procedure(s) Performed: ESOPHAGOGASTRODUODENOSCOPY (EGD) BOTOX INJECTION  Patient Location: PACU  Anesthesia Type:MAC  Level of Consciousness: sedated  Airway & Oxygen Therapy: Patient Spontanous Breathing and Patient connected to face mask oxygen  Post-op Assessment: Report given to RN and Post -op Vital signs reviewed and stable  Post vital signs: Reviewed and stable  Last Vitals:  Vitals Value Taken Time  BP    Temp    Pulse    Resp    SpO2      Last Pain:  Vitals:   01/15/22 1146  TempSrc: Tympanic         Complications: No notable events documented.

## 2022-01-15 NOTE — Discharge Instructions (Addendum)
YOU HAD AN ENDOSCOPIC PROCEDURE TODAY: Refer to the procedure report and other information in the discharge instructions given to you for any specific questions about what was found during the examination. If this information does not answer your questions, please call the [facility] office at [phone] to clarify.   YOU SHOULD EXPECT: Some feelings of bloating in the abdomen. Passage of more gas than usual. Walking can help get rid of the air that was put into your GI tract during the procedure and reduce the bloating.  DIET: Your first meal following the procedure should be a light meal and then it is ok to progress to your normal diet. A half-sandwich or bowl of soup is an example of a good first meal. Heavy or fried foods are harder to digest and may make you feel nauseous or bloated. Drink plenty of fluids but you should avoid alcoholic beverages for 24 hours.    ACTIVITY: Your care partner should take you home directly after the procedure. You should plan to take it easy, moving slowly for the rest of the day. You can resume normal activity the day after the procedure however YOU SHOULD NOT DRIVE, use power tools, machinery or perform tasks that involve climbing or major physical exertion for 24 hours (because of the sedation medicines used during the test).   SYMPTOMS TO REPORT IMMEDIATELY: A gastroenterologist can be reached at any hour. Please call [phone]  for any of the following symptoms:   Following upper endoscopy (EGD, EUS, ERCP, esophageal dilation) Vomiting of blood or coffee ground material  New, significant abdominal pain  New, significant chest pain or pain under the shoulder blades  Painful or persistently difficult swallowing  New shortness of breath  Black, tarry-looking or red, bloody stools  FOLLOW UP:  If any biopsies were taken you will be contacted by phone or by letter within the next 1-3 weeks. Call [phone]  if you have not heard about the biopsies in 3  weeks.  Please also call with any specific questions about appointments or follow up tests.   Clear liquids for 2 hours and if doing well may have soft solids the rest of today and may slowly advance diet tomorrow and call if question or problem otherwise call in 1 to 2 weeks with an update and we will decide follow-up at that point and resume Plavix tomorrow if doing well

## 2022-01-15 NOTE — Progress Notes (Signed)
Mckenzie Lawrence 12:10 PM  Subjective: Patient seen and examined and no new symptoms since she was seen last week in the office and she stopped her Plavix on Thursday and we rediscussed the procedure and she has no other complaints  Objective: Vital signs stable afebrile no acute distress exam please see preassessment evaluation  Assessment: Concerns over achalasia  Plan: Okay to proceed with endoscopy and probably Botox with anesthesia assistance  [facility]  office [phone] After 5PM or if no answer call [phone]

## 2022-01-17 ENCOUNTER — Encounter (HOSPITAL_COMMUNITY): Payer: Self-pay | Admitting: Gastroenterology

## 2022-01-17 DIAGNOSIS — M545 Low back pain, unspecified: Secondary | ICD-10-CM | POA: Diagnosis not present

## 2022-01-17 DIAGNOSIS — N3000 Acute cystitis without hematuria: Secondary | ICD-10-CM | POA: Diagnosis not present

## 2022-01-21 DIAGNOSIS — E1159 Type 2 diabetes mellitus with other circulatory complications: Secondary | ICD-10-CM | POA: Diagnosis not present

## 2022-01-21 DIAGNOSIS — N1832 Chronic kidney disease, stage 3b: Secondary | ICD-10-CM | POA: Diagnosis not present

## 2022-01-21 DIAGNOSIS — M545 Low back pain, unspecified: Secondary | ICD-10-CM | POA: Diagnosis not present

## 2022-01-22 ENCOUNTER — Emergency Department: Payer: HMO

## 2022-01-22 ENCOUNTER — Other Ambulatory Visit: Payer: Self-pay

## 2022-01-22 ENCOUNTER — Emergency Department
Admission: EM | Admit: 2022-01-22 | Discharge: 2022-01-22 | Disposition: A | Discharge status: Home / Self Care | Payer: HMO | Attending: Emergency Medicine | Admitting: Emergency Medicine

## 2022-01-22 DIAGNOSIS — N183 Chronic kidney disease, stage 3 unspecified: Secondary | ICD-10-CM | POA: Insufficient documentation

## 2022-01-22 DIAGNOSIS — R519 Headache, unspecified: Secondary | ICD-10-CM | POA: Diagnosis not present

## 2022-01-22 DIAGNOSIS — W19XXXA Unspecified fall, initial encounter: Secondary | ICD-10-CM | POA: Diagnosis not present

## 2022-01-22 DIAGNOSIS — I10 Essential (primary) hypertension: Secondary | ICD-10-CM | POA: Diagnosis not present

## 2022-01-22 DIAGNOSIS — S81811A Laceration without foreign body, right lower leg, initial encounter: Secondary | ICD-10-CM | POA: Insufficient documentation

## 2022-01-22 DIAGNOSIS — R609 Edema, unspecified: Secondary | ICD-10-CM | POA: Diagnosis not present

## 2022-01-22 DIAGNOSIS — S8991XA Unspecified injury of right lower leg, initial encounter: Secondary | ICD-10-CM | POA: Diagnosis present

## 2022-01-22 DIAGNOSIS — S0083XA Contusion of other part of head, initial encounter: Secondary | ICD-10-CM | POA: Diagnosis not present

## 2022-01-22 DIAGNOSIS — R58 Hemorrhage, not elsewhere classified: Secondary | ICD-10-CM | POA: Diagnosis not present

## 2022-01-22 DIAGNOSIS — Y9301 Activity, walking, marching and hiking: Secondary | ICD-10-CM | POA: Diagnosis not present

## 2022-01-22 DIAGNOSIS — I2510 Atherosclerotic heart disease of native coronary artery without angina pectoris: Secondary | ICD-10-CM | POA: Insufficient documentation

## 2022-01-22 DIAGNOSIS — M542 Cervicalgia: Secondary | ICD-10-CM | POA: Diagnosis not present

## 2022-01-22 DIAGNOSIS — R600 Localized edema: Secondary | ICD-10-CM | POA: Diagnosis not present

## 2022-01-22 DIAGNOSIS — Z23 Encounter for immunization: Secondary | ICD-10-CM | POA: Diagnosis not present

## 2022-01-22 DIAGNOSIS — S0990XA Unspecified injury of head, initial encounter: Secondary | ICD-10-CM | POA: Diagnosis not present

## 2022-01-22 DIAGNOSIS — S0121XA Laceration without foreign body of nose, initial encounter: Secondary | ICD-10-CM | POA: Insufficient documentation

## 2022-01-22 DIAGNOSIS — E1122 Type 2 diabetes mellitus with diabetic chronic kidney disease: Secondary | ICD-10-CM | POA: Diagnosis not present

## 2022-01-22 DIAGNOSIS — W01198A Fall on same level from slipping, tripping and stumbling with subsequent striking against other object, initial encounter: Secondary | ICD-10-CM | POA: Diagnosis not present

## 2022-01-22 DIAGNOSIS — M25562 Pain in left knee: Secondary | ICD-10-CM | POA: Diagnosis not present

## 2022-01-22 MED ORDER — CEPHALEXIN 500 MG PO CAPS: 1000.0000 mg | ORAL_CAPSULE | Freq: Two times a day (BID) | ORAL | 0 refills | Status: AC

## 2022-01-22 MED ORDER — LIDOCAINE-EPINEPHRINE (PF) 2 %-1:200000 IJ SOLN
10.0000 mL | Freq: Once | INTRAMUSCULAR | Status: AC
  Administered 2022-01-22: 10 mL
  Filled 2022-01-22: qty 20

## 2022-01-22 MED ORDER — TETANUS-DIPHTH-ACELL PERTUSSIS 5-2.5-18.5 LF-MCG/0.5 IM SUSY
0.5000 mL | PREFILLED_SYRINGE | Freq: Once | INTRAMUSCULAR | Status: AC
  Administered 2022-01-22: 0.5 mL via INTRAMUSCULAR
  Filled 2022-01-22: qty 0.5

## 2022-01-22 MED ORDER — ACETAMINOPHEN 500 MG PO TABS
1000.0000 mg | ORAL_TABLET | Freq: Once | ORAL | Status: AC
  Administered 2022-01-22: 1000 mg via ORAL
  Filled 2022-01-22: qty 2

## 2022-01-22 NOTE — ED Notes (Signed)
Pt asking for ice pack. Pt asking when they will get xrays done. RN informed them that md will be in asap.

## 2022-01-22 NOTE — ED Provider Notes (Signed)
[facility] Provider Note    Event Date/Time   First MD Initiated Contact with Patient 01/22/22 1927     (approximate)   History   Chief Complaint Fall   HPI Mckenzie Lawrence is a 76 y.o. female, history of type 2 diabetes, osteopenia, PCOS, prior stroke, CKD stage III, CAD, anxiety, presents to the emergency department for evaluation of injury sustained from fall.  She states that she was walking out of a restaurant when she tripped and fell forward onto the ground.  She states that she hit her nose and forehead on the ground and sustained a laceration on the right shin.  She states that she additionally hurt her left knee.  She was brought in by EMS.  She has not symptoms ambulation since the fall.  Denies LOC, chest pain, shortness of breath, abdominal pain, nausea/vomiting, vision change, hearing changes, numbness/tingling upper or lower extremities, or dizziness/lightheadedness/vertigo.  History Limitations: No limitations.        Physical Exam  Triage Vital Signs: ED Triage Vitals  Enc Vitals Group     BP 01/22/22 1913 (!) 206/66     Pulse Rate 01/22/22 1913 75     Resp 01/22/22 1913 17     Temp 01/22/22 1913 98.7 F (37.1 C)     Temp Source 01/22/22 1913 Oral     SpO2 01/22/22 1913 99 %     Weight 01/22/22 1916 172 lb (78 kg)     Height 01/22/22 1916 '5\' 7"'$  (1.702 m)     Head Circumference --      Peak Flow --      Pain Score 01/22/22 1916 8     Pain Loc --      Pain Edu? --      Excl. in [place]? --     Most recent vital signs: Vitals:   01/22/22 1913 01/22/22 2330  BP: (!) 206/66 (!) 165/69  Pulse: 75 79  Resp: 17 18  Temp: 98.7 F (37.1 C) 98.5 F (36.9 C)  SpO2: 99% 99%    General: Awake, NAD.  Skin: Warm, dry. No rashes or lesions.  Eyes: PERRL. Conjunctivae normal.  CV: Good peripheral perfusion.  Resp: Normal effort.  Abd: Soft, non-tender. No distention.  Neuro: At baseline. No gross neurological deficits.   Focused Exam:  Small bruise appreciated on the frontal aspect of the forehead.  No significant hematoma.  Two small 1 to 2 cm superficial lacerations on the bridge of the nose, right side.  No gross osseous deformities to the lower extremities.  4 cm horizontal laceration along the distal aspect of the anterior lower leg.  No active bleeding or discharge.  PMS intact distally.    Physical Exam    ED Results / Procedures / Treatments  Labs (all labs ordered are listed, but only abnormal results are displayed) Labs Reviewed - No data to display   EKG N/A.   RADIOLOGY  ED Provider Interpretation: I personally viewed and interpreted these images.  Knee x-ray shows no evidence of fractures.  Tibia/fibula x-ray negative.  CT head negative.  CT cervical spine negative.  DG Knee Complete 4 Views Left  Result Date: 01/22/2022 CLINICAL DATA:  Fall, knee pain EXAM: LEFT KNEE - COMPLETE 4+ VIEW COMPARISON:  None Available. FINDINGS: No evidence of fracture, dislocation, or joint effusion. No evidence of arthropathy or other focal bone abnormality. Soft tissues are unremarkable. IMPRESSION: Negative. Electronically Signed   By: Rolm Baptise M.D.  On: 01/22/2022 21:05   DG Tibia/Fibula Right  Result Date: 01/22/2022 CLINICAL DATA:  [place], pain EXAM: RIGHT TIBIA AND FIBULA - 2 VIEW COMPARISON:  None Available. FINDINGS: Frontal and lateral views of the right tibia and fibula are obtained. There are no acute displaced fractures. Anatomic alignment of the right knee and ankle. There is diffuse subcutaneous edema. IMPRESSION: 1. Diffuse soft tissue edema. 2. No acute fracture. Electronically Signed   By: Randa Ngo M.D.   On: 01/22/2022 21:05   CT Head Wo Contrast  Result Date: 01/22/2022 CLINICAL DATA:  Patient coming from [employer] c/o fall walking out of, pt noted to have laceration on the bridge of her nose, right shin, and c/o pain on left elbow. Pt is on blood thinners. EXAM: CT HEAD WITHOUT CONTRAST  CT MAXILLOFACIAL WITHOUT CONTRAST CT CERVICAL SPINE WITHOUT CONTRAST TECHNIQUE: Multidetector CT imaging of the head, cervical spine, and maxillofacial structures were performed using the standard protocol without intravenous contrast. Multiplanar CT image reconstructions of the cervical spine and maxillofacial structures were also generated. RADIATION DOSE REDUCTION: This exam was performed according to the departmental dose-optimization program which includes automated exposure control, adjustment of the mA and/or kV according to patient size and/or use of iterative reconstruction technique. COMPARISON:  None Available. FINDINGS: CT HEAD FINDINGS Brain: No evidence of large-territorial acute infarction. No parenchymal hemorrhage. No mass lesion. No extra-axial collection. No mass effect or midline shift. No hydrocephalus. Basilar cisterns are patent. Vascular: No hyperdense vessel. Skull: No acute fracture or focal lesion. Other: Frontal scalp midline subcutaneus soft tissue edema and trace hematoma formation. CT MAXILLOFACIAL FINDINGS Osseous: No fracture or mandibular dislocation. No destructive process. Sinuses/Orbits: Paranasal sinuses and mastoid air cells are clear. Bilateral lens replacement. Otherwise the orbits are unremarkable. Soft tissues: Negative. CT CERVICAL SPINE FINDINGS Alignment: Normal. Skull base and vertebrae: Fusion of the right C2-C3 facets. No acute fracture. No aggressive appearing focal osseous lesion or focal pathologic process. Soft tissues and spinal canal: No prevertebral fluid or swelling. No visible canal hematoma. Upper chest: Emphysematous changes. Other: Atherosclerotic plaque of the carotid arteries within the neck. IMPRESSION: 1. No acute intracranial abnormality. 2. No acute displaced facial fracture. 3. No acute displaced fracture or traumatic listhesis of the cervical spine. 4.  Emphysema (ICD10-J43.9). Electronically Signed   By: Iven Finn M.D.   On: 01/22/2022 20:58    CT Cervical Spine Wo Contrast  Result Date: 01/22/2022 CLINICAL DATA:  Patient coming from [employer] c/o fall walking out of, pt noted to have laceration on the bridge of her nose, right shin, and c/o pain on left elbow. Pt is on blood thinners. EXAM: CT HEAD WITHOUT CONTRAST CT MAXILLOFACIAL WITHOUT CONTRAST CT CERVICAL SPINE WITHOUT CONTRAST TECHNIQUE: Multidetector CT imaging of the head, cervical spine, and maxillofacial structures were performed using the standard protocol without intravenous contrast. Multiplanar CT image reconstructions of the cervical spine and maxillofacial structures were also generated. RADIATION DOSE REDUCTION: This exam was performed according to the departmental dose-optimization program which includes automated exposure control, adjustment of the mA and/or kV according to patient size and/or use of iterative reconstruction technique. COMPARISON:  None Available. FINDINGS: CT HEAD FINDINGS Brain: No evidence of large-territorial acute infarction. No parenchymal hemorrhage. No mass lesion. No extra-axial collection. No mass effect or midline shift. No hydrocephalus. Basilar cisterns are patent. Vascular: No hyperdense vessel. Skull: No acute fracture or focal lesion. Other: Frontal scalp midline subcutaneus soft tissue edema and trace hematoma formation. CT MAXILLOFACIAL FINDINGS  Osseous: No fracture or mandibular dislocation. No destructive process. Sinuses/Orbits: Paranasal sinuses and mastoid air cells are clear. Bilateral lens replacement. Otherwise the orbits are unremarkable. Soft tissues: Negative. CT CERVICAL SPINE FINDINGS Alignment: Normal. Skull base and vertebrae: Fusion of the right C2-C3 facets. No acute fracture. No aggressive appearing focal osseous lesion or focal pathologic process. Soft tissues and spinal canal: No prevertebral fluid or swelling. No visible canal hematoma. Upper chest: Emphysematous changes. Other: Atherosclerotic plaque of the carotid  arteries within the neck. IMPRESSION: 1. No acute intracranial abnormality. 2. No acute displaced facial fracture. 3. No acute displaced fracture or traumatic listhesis of the cervical spine. 4.  Emphysema (ICD10-J43.9). Electronically Signed   By: Iven Finn M.D.   On: 01/22/2022 20:58   CT Maxillofacial Wo Contrast  Result Date: 01/22/2022 CLINICAL DATA:  Patient coming from [employer] c/o fall walking out of, pt noted to have laceration on the bridge of her nose, right shin, and c/o pain on left elbow. Pt is on blood thinners. EXAM: CT HEAD WITHOUT CONTRAST CT MAXILLOFACIAL WITHOUT CONTRAST CT CERVICAL SPINE WITHOUT CONTRAST TECHNIQUE: Multidetector CT imaging of the head, cervical spine, and maxillofacial structures were performed using the standard protocol without intravenous contrast. Multiplanar CT image reconstructions of the cervical spine and maxillofacial structures were also generated. RADIATION DOSE REDUCTION: This exam was performed according to the departmental dose-optimization program which includes automated exposure control, adjustment of the mA and/or kV according to patient size and/or use of iterative reconstruction technique. COMPARISON:  None Available. FINDINGS: CT HEAD FINDINGS Brain: No evidence of large-territorial acute infarction. No parenchymal hemorrhage. No mass lesion. No extra-axial collection. No mass effect or midline shift. No hydrocephalus. Basilar cisterns are patent. Vascular: No hyperdense vessel. Skull: No acute fracture or focal lesion. Other: Frontal scalp midline subcutaneus soft tissue edema and trace hematoma formation. CT MAXILLOFACIAL FINDINGS Osseous: No fracture or mandibular dislocation. No destructive process. Sinuses/Orbits: Paranasal sinuses and mastoid air cells are clear. Bilateral lens replacement. Otherwise the orbits are unremarkable. Soft tissues: Negative. CT CERVICAL SPINE FINDINGS Alignment: Normal. Skull base and vertebrae: Fusion of the  right C2-C3 facets. No acute fracture. No aggressive appearing focal osseous lesion or focal pathologic process. Soft tissues and spinal canal: No prevertebral fluid or swelling. No visible canal hematoma. Upper chest: Emphysematous changes. Other: Atherosclerotic plaque of the carotid arteries within the neck. IMPRESSION: 1. No acute intracranial abnormality. 2. No acute displaced facial fracture. 3. No acute displaced fracture or traumatic listhesis of the cervical spine. 4.  Emphysema (ICD10-J43.9). Electronically Signed   By: Iven Finn M.D.   On: 01/22/2022 20:58    PROCEDURES:  Critical Care performed: N/A.  Marland Kitchen.Laceration Repair  Date/Time: 01/23/2022 1:08 AM  Performed by: Teodoro Spray, PA Authorized by: Teodoro Spray, PA   Consent:    Consent obtained:  Verbal   Consent given by:  Patient   Risks, benefits, and alternatives were discussed: yes     Risks discussed:  Infection, pain, retained foreign body, need for additional repair, poor cosmetic result and tendon damage   Alternatives discussed:  No treatment Universal protocol:    Patient identity confirmed:  Verbally with patient Anesthesia:    Anesthesia method:  Local infiltration   Local anesthetic:  Lidocaine 2% WITH epi Laceration details:    Location:  Leg   Leg location:  R lower leg   Length (cm):  4   Depth (mm):  2 Pre-procedure details:    Preparation:  Patient was prepped and draped in usual sterile fashion Exploration:    Hemostasis achieved with:  Direct pressure   Wound exploration: wound explored through full range of motion and entire depth of wound visualized     Wound extent: no foreign bodies/material noted, no muscle damage noted, no underlying fracture noted and no vascular damage noted   Treatment:    Area cleansed with:  Saline   Amount of cleaning:  Standard   Irrigation solution:  Sterile saline   Irrigation volume:  1000 ml   Irrigation method:  Pressure wash Skin repair:     Repair method:  Sutures   Suture size:  4-0   Suture material:  Nylon   Suture technique:  Running locked   Number of sutures:  11 Approximation:    Approximation:  Close Repair type:    Repair type:  Simple Post-procedure details:    Dressing:  Sterile dressing and adhesive bandage   Procedure completion:  Tolerated well, no immediate complications .Marland KitchenLaceration Repair  Date/Time: 01/23/2022 1:11 AM  Performed by: Teodoro Spray, PA Authorized by: Teodoro Spray, PA   Consent:    Consent obtained:  Verbal   Consent given by:  Patient   Risks, benefits, and alternatives were discussed: yes     Risks discussed:  Vascular damage, pain and infection   Alternatives discussed:  No treatment Universal protocol:    Patient identity confirmed:  Verbally with patient Anesthesia:    Anesthesia method:  None Laceration details:    Location:  Face   Face location:  Nose   Length (cm):  1   Depth (mm):  1 Exploration:    Hemostasis achieved with:  Direct pressure   Wound exploration: wound explored through full range of motion and entire depth of wound visualized     Wound extent: no foreign bodies/material noted, no underlying fracture noted and no vascular damage noted   Treatment:    Area cleansed with:  Saline   Amount of cleaning:  Standard   Irrigation solution:  Sterile saline   Irrigation volume:  500   Irrigation method:  Pressure wash Skin repair:    Repair method:  Tissue adhesive Approximation:    Approximation:  Close Repair type:    Repair type:  Simple Post-procedure details:    Dressing:  Open (no dressing)   Procedure completion:  Tolerated well, no immediate complications     MEDICATIONS ORDERED IN ED: Medications  acetaminophen (TYLENOL) tablet 1,000 mg (1,000 mg Oral Given 01/22/22 2059)  lidocaine-EPINEPHrine (XYLOCAINE W/EPI) 2 %-1:200000 (PF) injection 10 mL (10 mLs Infiltration Given by Other 01/22/22 2123)  Tdap (BOOSTRIX) injection 0.5  mL (0.5 mLs Intramuscular Given 01/22/22 2325)     IMPRESSION / MDM / [place] / ED COURSE  I reviewed the triage vital signs and the nursing notes.                              Differential diagnosis includes, but is not limited to, maxillofacial injury, subdural/subdural hematoma, concussion, laceration, tibia/fibula fracture, knee fracture/dislocation.   Assessment/Plan Patient presents with injury sustained from mechanical fall after walking out of a restaurant.  She denies any preceding symptoms.  Her imaging is reassuring for no evidence of intracranial abnormalities, cervical spine injury, or fractures in the lower extremities.  She does have a 4 cm laceration on the right lower extremity, which was repaired with nonabsorbable sutures.  See above for details.  She additionally had a 1 cm laceration on the right side of the nasal bridge, which was repaired with Dermabond.  Will provide her with some antibiotic prophylaxis given her age and comorbidities.  She states that she is fine taking Tylenol as needed for pain.  Will discharge.  Considered admission for this patient, but given her stable presentation and unremarkable imaging, she is unlikely to benefit from admission.  Provided the patient with anticipatory guidance, return precautions, and educational material. Encouraged the patient to return to the emergency department at any time if they begin to experience any new or worsening symptoms. Patient expressed understanding and agreed with the plan.   Patient's presentation is most consistent with acute complicated illness / injury requiring diagnostic workup.       FINAL CLINICAL IMPRESSION(S) / ED DIAGNOSES   Final diagnoses:  Fall, initial encounter  Laceration of right lower extremity, initial encounter     Rx / DC Orders   ED Discharge Orders          Ordered    cephALEXin (KEFLEX) 500 MG capsule  2 times daily        01/22/22 2311              Note:  This document was prepared using Dragon voice recognition software and may include unintentional dictation errors.   Teodoro Spray, [place] 01/23/22 0932    Naaman Plummer, MD 01/24/22 [phone]

## 2022-01-22 NOTE — Discharge Instructions (Addendum)
-  Take all of your antibiotics as prescribed.  -Return to the emergency department or go see your regular doctor for suture removal in 10 days.  -Change your bandage every day and apply a topical antibiotic ointment to it every morning.  -Avoid prolonged walking or any excessive movements of your leg.  -The Dermabond glue solution on your nose will dissolve on its own in 5 to 7 days.  Avoid applying any topical oils, lotions, or emollients, as this will cause the glue to dissolve prematurely.  -Return to the emergency department anytime if you begin to experience any new or worsening symptoms.

## 2022-01-22 NOTE — ED Triage Notes (Signed)
Patient coming from [employer] c/o fall walking out of, pt noted to have laceration on the bridge of her nose, right shin, and c/o pain on left elbow. Pt is on blood thinners. Cbg 191

## 2022-01-25 DIAGNOSIS — M48062 Spinal stenosis, lumbar region with neurogenic claudication: Secondary | ICD-10-CM | POA: Diagnosis not present

## 2022-01-29 DIAGNOSIS — M545 Low back pain, unspecified: Secondary | ICD-10-CM | POA: Diagnosis not present

## 2022-01-31 DIAGNOSIS — R351 Nocturia: Secondary | ICD-10-CM | POA: Diagnosis not present

## 2022-01-31 DIAGNOSIS — R350 Frequency of micturition: Secondary | ICD-10-CM | POA: Diagnosis not present

## 2022-01-31 DIAGNOSIS — N3946 Mixed incontinence: Secondary | ICD-10-CM | POA: Diagnosis not present

## 2022-01-31 DIAGNOSIS — N3020 Other chronic cystitis without hematuria: Secondary | ICD-10-CM | POA: Diagnosis not present

## 2022-02-01 DIAGNOSIS — M545 Low back pain, unspecified: Secondary | ICD-10-CM | POA: Diagnosis not present

## 2022-02-01 DIAGNOSIS — Z4802 Encounter for removal of sutures: Secondary | ICD-10-CM | POA: Diagnosis not present

## 2022-02-06 DIAGNOSIS — M545 Low back pain, unspecified: Secondary | ICD-10-CM | POA: Diagnosis not present

## 2022-02-08 DIAGNOSIS — M545 Low back pain, unspecified: Secondary | ICD-10-CM | POA: Diagnosis not present

## 2022-02-11 DIAGNOSIS — M545 Low back pain, unspecified: Secondary | ICD-10-CM | POA: Diagnosis not present

## 2022-02-13 DIAGNOSIS — M545 Low back pain, unspecified: Secondary | ICD-10-CM | POA: Diagnosis not present

## 2022-02-20 DIAGNOSIS — E871 Hypo-osmolality and hyponatremia: Secondary | ICD-10-CM | POA: Diagnosis not present

## 2022-02-22 DIAGNOSIS — E1122 Type 2 diabetes mellitus with diabetic chronic kidney disease: Secondary | ICD-10-CM | POA: Diagnosis not present

## 2022-02-22 DIAGNOSIS — N1832 Chronic kidney disease, stage 3b: Secondary | ICD-10-CM | POA: Diagnosis not present

## 2022-02-25 DIAGNOSIS — M5451 Vertebrogenic low back pain: Secondary | ICD-10-CM | POA: Diagnosis not present

## 2022-03-04 DIAGNOSIS — R351 Nocturia: Secondary | ICD-10-CM | POA: Diagnosis not present

## 2022-03-04 DIAGNOSIS — N3946 Mixed incontinence: Secondary | ICD-10-CM | POA: Diagnosis not present

## 2022-03-04 DIAGNOSIS — N3020 Other chronic cystitis without hematuria: Secondary | ICD-10-CM | POA: Diagnosis not present

## 2022-03-05 DIAGNOSIS — M5451 Vertebrogenic low back pain: Secondary | ICD-10-CM | POA: Diagnosis not present

## 2022-03-08 DIAGNOSIS — M5451 Vertebrogenic low back pain: Secondary | ICD-10-CM | POA: Diagnosis not present

## 2022-03-12 DIAGNOSIS — M5451 Vertebrogenic low back pain: Secondary | ICD-10-CM | POA: Diagnosis not present

## 2022-03-18 DIAGNOSIS — M5451 Vertebrogenic low back pain: Secondary | ICD-10-CM | POA: Diagnosis not present

## 2022-03-20 DIAGNOSIS — N3020 Other chronic cystitis without hematuria: Secondary | ICD-10-CM | POA: Diagnosis not present

## 2022-03-21 DIAGNOSIS — M5451 Vertebrogenic low back pain: Secondary | ICD-10-CM | POA: Diagnosis not present

## 2022-03-25 DIAGNOSIS — M5451 Vertebrogenic low back pain: Secondary | ICD-10-CM | POA: Diagnosis not present

## 2022-04-03 ENCOUNTER — Ambulatory Visit (INDEPENDENT_AMBULATORY_CARE_PROVIDER_SITE_OTHER): Payer: HMO | Admitting: Podiatry

## 2022-04-03 ENCOUNTER — Encounter: Payer: Self-pay | Admitting: Podiatry

## 2022-04-03 DIAGNOSIS — E1142 Type 2 diabetes mellitus with diabetic polyneuropathy: Secondary | ICD-10-CM | POA: Diagnosis not present

## 2022-04-03 DIAGNOSIS — M79676 Pain in unspecified toe(s): Secondary | ICD-10-CM

## 2022-04-03 DIAGNOSIS — B351 Tinea unguium: Secondary | ICD-10-CM

## 2022-04-03 DIAGNOSIS — D689 Coagulation defect, unspecified: Secondary | ICD-10-CM

## 2022-04-03 NOTE — Progress Notes (Signed)
She presents today for chief complaint of painful elongated toenails.  Objective: Toenails are long thick yellow dystrophic and mycotic pulses are palpable bilateral no open lesions or wounds are noted.  Assessment: Pain in limb secondary to onychomycosis.  Plan: Debridement of toenails 1 through 5 bilateral.

## 2022-04-18 ENCOUNTER — Other Ambulatory Visit: Payer: Self-pay | Admitting: Student

## 2022-04-18 DIAGNOSIS — M5416 Radiculopathy, lumbar region: Secondary | ICD-10-CM

## 2022-04-25 DIAGNOSIS — I2510 Atherosclerotic heart disease of native coronary artery without angina pectoris: Secondary | ICD-10-CM

## 2022-04-25 HISTORY — DX: Atherosclerotic heart disease of native coronary artery without angina pectoris: I25.10

## 2022-05-01 ENCOUNTER — Other Ambulatory Visit: Payer: Self-pay

## 2022-05-01 DIAGNOSIS — F17210 Nicotine dependence, cigarettes, uncomplicated: Secondary | ICD-10-CM

## 2022-05-01 DIAGNOSIS — Z87891 Personal history of nicotine dependence: Secondary | ICD-10-CM

## 2022-05-01 DIAGNOSIS — Z122 Encounter for screening for malignant neoplasm of respiratory organs: Secondary | ICD-10-CM

## 2022-05-12 ENCOUNTER — Ambulatory Visit
Admission: RE | Admit: 2022-05-12 | Discharge: 2022-05-12 | Disposition: A | Discharge status: Home / Self Care | Payer: HMO | Source: Ambulatory Visit | Attending: Student | Admitting: Student

## 2022-05-12 DIAGNOSIS — M5416 Radiculopathy, lumbar region: Secondary | ICD-10-CM

## 2022-05-12 MED ORDER — GADOPICLENOL 0.5 MMOL/ML IV SOLN
7.0000 mL | Freq: Once | INTRAVENOUS | Status: AC | PRN
  Administered 2022-05-12: 7 mL via INTRAVENOUS

## 2022-05-16 ENCOUNTER — Other Ambulatory Visit: Payer: Self-pay | Admitting: Neurosurgery

## 2022-05-17 ENCOUNTER — Other Ambulatory Visit: Payer: Self-pay | Admitting: Neurosurgery

## 2022-05-17 ENCOUNTER — Encounter (HOSPITAL_COMMUNITY): Payer: Self-pay | Admitting: Neurosurgery

## 2022-05-17 ENCOUNTER — Other Ambulatory Visit: Payer: Self-pay

## 2022-05-17 NOTE — Progress Notes (Addendum)
Anesthesia Chart Review:  Case: [id number] Date/Time: 05/20/22 1301   Procedure: MICRODISCECTOMY L3-4 (Left) - 3C   Anesthesia type: General   Pre-op diagnosis: RECURRENT HNP   Location: [facility] OR ROOM 21 / [place] [facility]   Surgeons: Newman Pies, MD       DISCUSSION: Patient is a 76 year old female scheduled for the above procedure.  History includes smoking, COPD, HTN (and hypotension), HLD, DM2, OSA (uses CPAP), CKD (IIIB), CVA (02/2015), PCOS, spinal surgery (L4-5 microdiscectomy 05/11/18; L4-S1 laminotomy/foraminotomy/PLIF, L4-S1 posterolateral arthrodesis PLIF 4//23, L34- microdiscectomy 10/01/21), varicose veins (s/p right GSV ablation), hyponatremia (likely due to SIADH).   She had a prolonged hospitalization after 10/01/21 lumbar fusion. She was not discharged until 10/11/21. Hospital course complicated by AKI on CKD, ABL s/p 2 units PRBC. She was denied CIRC placement and declined SNF level rehab. She told PAT RN staff that she had hallucination and kidney failure post-operatively and believes she was "overdosed" with anesthesia. She was then readmitted 5/26/235/29/23 for acute pancreatitis felt due to medication like Cipro and/or Crestor. She does not use alcohol.    Last cardiology visit with Dr. Josefa Half was on 04/25/22. She was doing okay from a cardiac standpoint. HTN controlled. Crestor on hold since May pancreatitis. No new cardiac testing ordered. 4 month follow-up planned. She was previously given clearance to hold ASA and Plavix for 7 days prior to April back surgery.   Last PCP visit with Dr. Kary Kos was on 04/09/22 and nephrology follow-up with Dr. Holley Raring was on 12/25/21. Her Creatinine had ranged from ~ 1.8-2.2 since 09/2020-10/01/21. It peaked around 2.4 post-operatively on 10/03/21. Her Creatinine has been ~ 1.7-2.0 since September 2023.  Last visit with pulmonologist Dr. Raul Del was on 12/20/21. COPD documented as "mild COPD, stage II". She was not yet ready to stop smoking. Continue CPAP at  current settings.   Last Plavix dose is documented as 05/15/22.  She had a renal function panel and CBC 04/23/22 with nephrology.  These results can be viewed in [place].  A1c was 6.1% 01/21/22.  She is a same day work-up, so further anesthesia team evaluation on the day of surgery. (UPDATE 05/17/22 4:49 PM: Received neurology risk assessment, classified as moderate risk with permission to hold ASA and Plavix for 5 days prior to surgery.)   VS: BP Readings from Last 3 Encounters:  01/22/22 (!) 165/69  01/15/22 (!) 207/65  11/12/21 (!) 170/68   Pulse Readings from Last 3 Encounters:  01/22/22 79  01/15/22 70  11/12/21 74    PROVIDERS: Maryland Pink, MD is PCP  Isaias Cowman, MD is cardiologist  Anthonette Legato, MD is nephrologist Wallene Huh, MD is pulmonologist Olean Ree, MD is GI Jennings Books, MD is neurologist   LABS: She had labs on 04/23/22 through [facility] (see Care Everywhere). Results included: Leukos 200, BUN 29, creatinine 1.90, sodium 131, potassium 5.0, phosphorus 4.3, calcium 9.3, albumin 3.6, WBC 10.7, hemoglobin 11.1, hematocrit 32.8, platelet count 328.  CMP on 04/09/2022 ([facility]) showed Creatinine 2.0,  AST 13, ALT 5, alkaline phosphatase 113, total bilirubin 0.4.  A1c 6.1% 01/21/2022 ([facility]).   IMAGES: MRI L-spine 05/12/22: IMPRESSION: 1. Fusion and laminectomy at L4-5 and L5-S1 without residual or recurrent stenosis. 2. Superior directed left paramedian disc extrusion at L3-4 extends 2 cm cephalo caudad and measures over 10 mm in AP diameter. This results in severe left subarticular and lateral recess narrowing. 3. Severe left and moderate right foraminal stenosis at L3-4. 4. Mild left foraminal  stenosis at L2-3.  CT Chest 01/14/22: IMPRESSION: 1. Multiple scattered bilateral pulmonary nodules, overall not significantly changed in size from prior exam. No pulmonary mass. Please note examination was not performed as a lung  cancer screening exam. Recommend return to annual low-dose screening CT. 2. Previous patchy ground-glass attenuation throughout both lungs on prior exam has improved and essentially resolved in the interim. 3. Patulous upper esophagus with wall thickening in the midportion, query esophagitis. Recommend correlation with prior EGD results. 4. Aortic atherosclerosis.  Coronary artery calcifications. 5. Emphysema and bronchial wall thickening, imaging findings suggestive of COPD.   EKG: 11/09/21:  Sinus rhythm with Premature atrial complexes Rightward axis Cannot rule out Anterior infarct , age undetermined Abnormal ECG When compared with ECG of 06-May-2018 14:50, Premature atrial complexes are now Present T wave amplitude has increased in Lateral leads   CV: Echo 08/06/18 ([facility]) INTERPRETATION  NORMAL LEFT VENTRICULAR SYSTOLIC FUNCTION   WITH MILD LVH  NORMAL RIGHT VENTRICULAR SYSTOLIC FUNCTION  TRIVIAL REGURGITATION NOTED (See above)  NO VALVULAR STENOSIS  TRIVIAL MR, TR  EF 60%     Per [facility] notes: - 72-hour Holter monitor (03/19/2021 - 03/22/2021) revealed predominant sinus rhythm with mean heart rate of 83 bpm, heart rate range 62 to 176 bpm, episodes of sinus tachycardia as well as occasional premature atrial contractions.    - CAD (coronary artery disease) 20% LAD, 40% RCA stenosis. Other vessels normal.  mild, 2001 noted to have 20% LAD, 40% RCA stenosis, other vessels normal      US Carotid 04/26/16: Impression: Doppler velocities suggest 1 to 39% bilateral proximal internal carotid artery stenoses.   ETT 12/06/11: Recommendations:  Negative adequate ETT.  No further testing is indicated.    Past Medical History:  Diagnosis Date   Anemia    Arthritis    Bladder incontinence    Broken foot    Cataracts, bilateral    Chronic kidney insufficiency    COPD (chronic obstructive pulmonary disease) ([place])    wheezing   COVID 2021   very mild case    CVA (cerebral vascular accident) ([place]) 2016   has had 3 strokes, states right side is slightlyweaker than left   Diabetes mellitus    insulin dependent, Type 2   GERD (gastroesophageal reflux disease)    HLD (hyperlipidemia)    Hx of cardiovascular stress test    a. ETT (6/13):  Ex 5:13; no ischemic changes   Hypertension    controlled on meds   Lacunar stroke of left subthalamic region [facility]) 02/2015   Leg pain    left   Lower back pain    Neuromuscular disorder (HCC)    stroke right hand tingling   Orthostatic hypotension    Osteopenia 01/2017   T score -2.0 stable from prior DEXA   Pancreatitis 10/2021   PCOS (polycystic ovarian syndrome)    Personal history of tobacco use, presenting hazards to health 01/09/2015   PONV (postoperative nausea and vomiting)    Sleep apnea    borderline/ CPAP    Past Surgical History:  Procedure Laterality Date   BOTOX INJECTION  01/15/2022   Procedure: BOTOX INJECTION;  Surgeon: Clarene Essex, MD;  Location: [facility];  Service: Gastroenterology;;   Delphina Cahill LIFT Bilateral 11/04/2017   Procedure: BLEPHAROPLASTY UPPER EYELID W/EXCESS SKIN;  Surgeon: Karle Starch, MD;  Location: [place];  Service: Ophthalmology;  Laterality: Bilateral;  DIABETES-insulin dependent uses CPAP   CARDIAC CATHETERIZATION  20 yrs ago  found nothing   CARPAL TUNNEL RELEASE Bilateral    CATARACT EXTRACTION Bilateral    ELBOW SURGERY Bilateral    ESOPHAGOGASTRODUODENOSCOPY N/A 07/25/2021   Procedure: ESOPHAGOGASTRODUODENOSCOPY (EGD);  Surgeon: Toledo, Benay Pike, MD;  Location: [facility];  Service: Gastroenterology;  Laterality: N/A;  IDDM   ESOPHAGOGASTRODUODENOSCOPY N/A 01/15/2022   Procedure: ESOPHAGOGASTRODUODENOSCOPY (EGD);  Surgeon: Clarene Essex, MD;  Location: [facility];  Service: Gastroenterology;  Laterality: N/A;  botox   FOOT SURGERY     Groin Abscess     HAND SURGERY     KNEE SURGERY Bilateral    LABIAL ABSCESS     LUMBAR  LAMINECTOMY/DECOMPRESSION MICRODISCECTOMY Left 05/11/2018   Procedure: LUMBAR LAMINECTOMY/DECOMPRESSION MICRODISCECTOMY 1 LEVEL- L4-5;  Surgeon: [name], MD;  Location: [facility];  Service: Neurosurgery;  Laterality: Left;   OOPHORECTOMY     BSO   PUBO VAG SLING     SHOULDER SURGERY     bilateral arthroscopies   VAGINAL HYSTERECTOMY  1979    MEDICATIONS: No current facility-administered medications for this encounter.    acetaminophen (TYLENOL) 500 MG tablet   aspirin EC 81 MG tablet   calcitRIOL (ROCALTROL) 0.25 MCG capsule   D-MANNOSE PO   diazepam (VALIUM) 10 MG tablet   estradiol (ESTRACE) 0.1 MG/GM vaginal cream   hydrOXYzine (ATARAX) 50 MG tablet   insulin detemir (LEVEMIR) 100 UNIT/ML injection   losartan (COZAAR) 100 MG tablet   metoprolol succinate (TOPROL-XL) 100 MG 24 hr tablet   Multiple Vitamins-Minerals (PRESERVISION AREDS 2+MULTI VIT PO)   nitrofurantoin, macrocrystal-monohydrate, (MACROBID) 100 MG capsule   OVER THE COUNTER MEDICATION   pantoprazole (PROTONIX) 40 MG tablet   sucralfate (CARAFATE) 1 g tablet   Vibegron (GEMTESA) 75 MG TABS   zolpidem (AMBIEN) 10 MG tablet   BD INSULIN SYRINGE U/F 31G X 5/16" 1 ML MISC   clopidogrel (PLAVIX) 75 MG tablet   ondansetron (ZOFRAN-ODT) 8 MG disintegrating tablet   ONETOUCH ULTRA test strip    Myra Gianotti, PA-C Surgical Short Stay/Anesthesiology [facility] Phone [phone] [facility] Phone [phone] 05/17/2022 1:35 PM

## 2022-05-17 NOTE — Progress Notes (Signed)
Spoke with pt for pre-op call. Pt has hx of HTN and has had 3 strokes starting in 2016. She is not sure when the others occurred. She states her right side is slightly weaker than the left side. Pt is a type 2 diabetic. Her last A1C was 6.1 on 01/21/22. States her fasting blood sugar is usually between 90-110. Instructed pt to take 1/2 of her regular dose of Levemir Monday AM, she will take 7 units. Instructed pt to check her blood sugar Monday AM when she wakes up and every 2 hours until she leaves for the hospital. If blood sugar is 70 or below, treat with 1/2 cup of clear juice (apple or cranberry) and recheck blood sugar 15 minutes after drinking juice. If blood sugar continues to be 70 or below, call the Short Stay department and ask to speak to a nurse.  Pt also is concerned about having too much pain medication/anesthesia during surgery. She states when she had surgery in April here that the "anesthesiologist overdosed" her. She states she was in the hospital 11 days after surgery with hallucinations and kidney failure. She states her daughter, who is nurse will be with her morning of surgery to make sure this doesn't happen again.   Shower instructions given to pt and she voiced understanding.   Chart sent to Anesthesia PA

## 2022-05-17 NOTE — Anesthesia Preprocedure Evaluation (Signed)
Anesthesia Evaluation  Patient identified by MRN, date of birth, ID band Patient awake    Reviewed: Allergy & Precautions, NPO status , Patient's Chart, lab work & pertinent test results  History of Anesthesia Complications (+) PONV and history of anesthetic complications  Airway Mallampati: II  TM Distance: >3 FB Neck ROM: Full    Dental  (+) Caps, Implants, Dental Advisory Given   Pulmonary sleep apnea and Continuous Positive Airway Pressure Ventilation , COPD, Current SmokerPatient did not abstain from smoking.   breath sounds clear to auscultation       Cardiovascular hypertension, Pt. on medications and Pt. on home beta blockers (-) angina + CAD (non-obstructive)   Rhythm:Regular Rate:Normal  '20 ECHO: normal LVF, EF 60%, mild LVH, normal RVF, trivial MR   Neuro/Psych   Anxiety     CVA (R weakness), Residual Symptoms    GI/Hepatic Neg liver ROS,GERD  Medicated and Controlled,,  Endo/Other  diabetes (glu 117), Insulin Dependent    Renal/GU Renal InsufficiencyRenal disease     Musculoskeletal  (+) Arthritis ,    Abdominal   Peds  Hematology negative hematology ROS (+)   Anesthesia Other Findings   Reproductive/Obstetrics                             Anesthesia Physical Anesthesia Plan  ASA: 3  Anesthesia Plan: General   Post-op Pain Management: Tylenol PO (pre-op)*   Induction: Intravenous  PONV Risk Score and Plan: 3 and Ondansetron, Dexamethasone and Treatment may vary due to age or medical condition  Airway Management Planned: Oral ETT  Additional Equipment: None  Intra-op Plan:   Post-operative Plan: Extubation in OR  Informed Consent: I have reviewed the patients History and Physical, chart, labs and discussed the procedure including the risks, benefits and alternatives for the proposed anesthesia with the patient or authorized representative who has indicated his/her  understanding and acceptance.     Dental advisory given  Plan Discussed with: CRNA and Surgeon  Anesthesia Plan Comments: (See PAT note written by Myra Gianotti, PA-C and PAT RN note regarding anesthesia concerns.  )       Anesthesia Quick Evaluation

## 2022-05-20 ENCOUNTER — Encounter (HOSPITAL_COMMUNITY)
Admission: RE | Disposition: A | Discharge status: Home / Self Care | Payer: Self-pay | Source: Home / Self Care | Attending: Neurosurgery

## 2022-05-20 ENCOUNTER — Ambulatory Visit (HOSPITAL_BASED_OUTPATIENT_CLINIC_OR_DEPARTMENT_OTHER): Payer: HMO | Admitting: Vascular Surgery

## 2022-05-20 ENCOUNTER — Encounter (HOSPITAL_COMMUNITY): Payer: Self-pay | Admitting: Neurosurgery

## 2022-05-20 ENCOUNTER — Ambulatory Visit (HOSPITAL_COMMUNITY)
Admission: RE | Admit: 2022-05-20 | Discharge: 2022-05-21 | Disposition: A | Discharge status: Home / Self Care | Payer: HMO | Attending: Neurosurgery | Admitting: Neurosurgery

## 2022-05-20 ENCOUNTER — Other Ambulatory Visit: Payer: Self-pay

## 2022-05-20 ENCOUNTER — Ambulatory Visit (HOSPITAL_COMMUNITY): Payer: HMO

## 2022-05-20 ENCOUNTER — Other Ambulatory Visit: Payer: HMO

## 2022-05-20 ENCOUNTER — Ambulatory Visit (HOSPITAL_COMMUNITY): Payer: HMO | Admitting: Vascular Surgery

## 2022-05-20 DIAGNOSIS — E1122 Type 2 diabetes mellitus with diabetic chronic kidney disease: Secondary | ICD-10-CM | POA: Diagnosis not present

## 2022-05-20 DIAGNOSIS — I10 Essential (primary) hypertension: Secondary | ICD-10-CM

## 2022-05-20 DIAGNOSIS — G4730 Sleep apnea, unspecified: Secondary | ICD-10-CM | POA: Diagnosis not present

## 2022-05-20 DIAGNOSIS — K219 Gastro-esophageal reflux disease without esophagitis: Secondary | ICD-10-CM | POA: Diagnosis not present

## 2022-05-20 DIAGNOSIS — F17210 Nicotine dependence, cigarettes, uncomplicated: Secondary | ICD-10-CM

## 2022-05-20 DIAGNOSIS — Z794 Long term (current) use of insulin: Secondary | ICD-10-CM | POA: Diagnosis not present

## 2022-05-20 DIAGNOSIS — I2510 Atherosclerotic heart disease of native coronary artery without angina pectoris: Secondary | ICD-10-CM | POA: Insufficient documentation

## 2022-05-20 DIAGNOSIS — M5126 Other intervertebral disc displacement, lumbar region: Secondary | ICD-10-CM | POA: Diagnosis present

## 2022-05-20 DIAGNOSIS — J439 Emphysema, unspecified: Secondary | ICD-10-CM | POA: Diagnosis not present

## 2022-05-20 DIAGNOSIS — N189 Chronic kidney disease, unspecified: Secondary | ICD-10-CM | POA: Insufficient documentation

## 2022-05-20 DIAGNOSIS — I69351 Hemiplegia and hemiparesis following cerebral infarction affecting right dominant side: Secondary | ICD-10-CM | POA: Insufficient documentation

## 2022-05-20 DIAGNOSIS — M5116 Intervertebral disc disorders with radiculopathy, lumbar region: Secondary | ICD-10-CM

## 2022-05-20 DIAGNOSIS — I129 Hypertensive chronic kidney disease with stage 1 through stage 4 chronic kidney disease, or unspecified chronic kidney disease: Secondary | ICD-10-CM | POA: Insufficient documentation

## 2022-05-20 HISTORY — PX: LUMBAR LAMINECTOMY/DECOMPRESSION MICRODISCECTOMY: SHX5026

## 2022-05-20 HISTORY — DX: Gastro-esophageal reflux disease without esophagitis: K21.9

## 2022-05-20 HISTORY — DX: Other specified postprocedural states: Z98.890

## 2022-05-20 HISTORY — DX: Other specified postprocedural states: R11.2

## 2022-05-20 HISTORY — DX: Anemia, unspecified: D64.9

## 2022-05-20 LAB — GLUCOSE, CAPILLARY
Glucose-Capillary: 117 mg/dL — ABNORMAL HIGH (ref 70–99)
Glucose-Capillary: 123 mg/dL — ABNORMAL HIGH (ref 70–99)
Glucose-Capillary: 185 mg/dL — ABNORMAL HIGH (ref 70–99)
Glucose-Capillary: 177 mg/dL — ABNORMAL HIGH (ref 70–99)
Glucose-Capillary: 216 mg/dL — ABNORMAL HIGH (ref 70–99)

## 2022-05-20 LAB — SURGICAL PCR SCREEN
MRSA, PCR: POSITIVE — AB
Staphylococcus aureus: POSITIVE — AB

## 2022-05-20 SURGERY — LUMBAR LAMINECTOMY/DECOMPRESSION MICRODISCECTOMY 1 LEVEL
Anesthesia: General | Laterality: Left

## 2022-05-20 MED ORDER — PHENOL 1.4 % MT LIQD: 1.0000 | OROMUCOSAL | Status: DC | PRN

## 2022-05-20 MED ORDER — NITROFURANTOIN MONOHYD MACRO 100 MG PO CAPS
100.0000 mg | ORAL_CAPSULE | Freq: Every day | ORAL | Status: DC
  Administered 2022-05-20: 100 mg via ORAL
  Filled 2022-05-20: qty 1

## 2022-05-20 MED ORDER — THROMBIN 5000 UNITS EX SOLR: OROMUCOSAL | Status: DC | PRN

## 2022-05-20 MED ORDER — CEFAZOLIN SODIUM-DEXTROSE 2-4 GM/100ML-% IV SOLN
2.0000 g | INTRAVENOUS | Status: AC
  Administered 2022-05-20: 2 g via INTRAVENOUS
  Filled 2022-05-20: qty 100

## 2022-05-20 MED ORDER — CHLORHEXIDINE GLUCONATE 0.12 % MT SOLN
15.0000 mL | Freq: Once | OROMUCOSAL | Status: AC
  Administered 2022-05-20: 15 mL via OROMUCOSAL
  Filled 2022-05-20: qty 15

## 2022-05-20 MED ORDER — OXYCODONE HCL 5 MG PO TABS: 10.0000 mg | ORAL_TABLET | ORAL | Status: DC | PRN

## 2022-05-20 MED ORDER — ONDANSETRON HCL 4 MG/2ML IJ SOLN
4.0000 mg | Freq: Four times a day (QID) | INTRAMUSCULAR | Status: DC | PRN
  Administered 2022-05-20: 4 mg via INTRAVENOUS

## 2022-05-20 MED ORDER — SUGAMMADEX SODIUM 200 MG/2ML IV SOLN
INTRAVENOUS | Status: DC | PRN
  Administered 2022-05-20: 150 mg via INTRAVENOUS

## 2022-05-20 MED ORDER — LIDOCAINE 2% (20 MG/ML) 5 ML SYRINGE
INTRAMUSCULAR | Status: DC | PRN
  Administered 2022-05-20 (×2): 20 mg via INTRAVENOUS

## 2022-05-20 MED ORDER — OXYCODONE HCL 5 MG PO TABS
ORAL_TABLET | ORAL | Status: AC
  Filled 2022-05-20: qty 1

## 2022-05-20 MED ORDER — ACETAMINOPHEN 500 MG PO TABS
1000.0000 mg | ORAL_TABLET | Freq: Four times a day (QID) | ORAL | Status: DC
  Administered 2022-05-21 (×2): 1000 mg via ORAL
  Filled 2022-05-20 (×2): qty 2

## 2022-05-20 MED ORDER — THROMBIN 5000 UNITS EX SOLR
CUTANEOUS | Status: AC
  Filled 2022-05-20: qty 5000

## 2022-05-20 MED ORDER — MIDAZOLAM HCL 2 MG/2ML IJ SOLN: 0.5000 mg | Freq: Once | INTRAMUSCULAR | Status: DC | PRN

## 2022-05-20 MED ORDER — ORAL CARE MOUTH RINSE: 15.0000 mL | Freq: Once | OROMUCOSAL | Status: AC

## 2022-05-20 MED ORDER — BACITRACIN ZINC 500 UNIT/GM EX OINT
TOPICAL_OINTMENT | CUTANEOUS | Status: DC | PRN
  Administered 2022-05-20: 1 via TOPICAL

## 2022-05-20 MED ORDER — CHLORHEXIDINE GLUCONATE CLOTH 2 % EX PADS: 6.0000 | MEDICATED_PAD | Freq: Once | CUTANEOUS | Status: DC

## 2022-05-20 MED ORDER — PHENYLEPHRINE HCL-NACL 20-0.9 MG/250ML-% IV SOLN
INTRAVENOUS | Status: DC | PRN
  Administered 2022-05-20: 10 ug/min via INTRAVENOUS

## 2022-05-20 MED ORDER — MEPERIDINE HCL 25 MG/ML IJ SOLN: 6.2500 mg | INTRAMUSCULAR | Status: DC | PRN

## 2022-05-20 MED ORDER — INSULIN ASPART 100 UNIT/ML IJ SOLN: 0.0000 [IU] | INTRAMUSCULAR | Status: DC | PRN

## 2022-05-20 MED ORDER — ONDANSETRON HCL 4 MG/2ML IJ SOLN
INTRAMUSCULAR | Status: DC | PRN
  Administered 2022-05-20: 4 mg via INTRAVENOUS

## 2022-05-20 MED ORDER — LACTATED RINGERS IV SOLN: INTRAVENOUS | Status: DC

## 2022-05-20 MED ORDER — ONDANSETRON HCL 4 MG/2ML IJ SOLN
INTRAMUSCULAR | Status: AC
  Filled 2022-05-20: qty 2

## 2022-05-20 MED ORDER — ACETAMINOPHEN 500 MG PO TABS
1000.0000 mg | ORAL_TABLET | Freq: Once | ORAL | Status: AC
  Administered 2022-05-20: 1000 mg via ORAL
  Filled 2022-05-20: qty 2

## 2022-05-20 MED ORDER — SUCRALFATE 1 G PO TABS
1.0000 g | ORAL_TABLET | Freq: Two times a day (BID) | ORAL | Status: DC
  Administered 2022-05-20: 1 g via ORAL
  Filled 2022-05-20 (×2): qty 1

## 2022-05-20 MED ORDER — HYDROMORPHONE HCL 1 MG/ML IJ SOLN
1.0000 mg | INTRAMUSCULAR | Status: DC | PRN
  Administered 2022-05-20: 0.25 mg via INTRAVENOUS

## 2022-05-20 MED ORDER — PHENYLEPHRINE 80 MCG/ML (10ML) SYRINGE FOR IV PUSH (FOR BLOOD PRESSURE SUPPORT)
PREFILLED_SYRINGE | INTRAVENOUS | Status: AC
  Filled 2022-05-20: qty 10

## 2022-05-20 MED ORDER — PROPOFOL 10 MG/ML IV BOLUS
INTRAVENOUS | Status: AC
  Filled 2022-05-20: qty 20

## 2022-05-20 MED ORDER — FENTANYL CITRATE (PF) 250 MCG/5ML IJ SOLN
INTRAMUSCULAR | Status: DC | PRN
  Administered 2022-05-20 (×2): 50 ug via INTRAVENOUS
  Administered 2022-05-20: 150 ug via INTRAVENOUS

## 2022-05-20 MED ORDER — CEFAZOLIN SODIUM-DEXTROSE 2-4 GM/100ML-% IV SOLN
2.0000 g | Freq: Three times a day (TID) | INTRAVENOUS | Status: AC
  Administered 2022-05-20: 2 g via INTRAVENOUS
  Filled 2022-05-20: qty 100

## 2022-05-20 MED ORDER — DOCUSATE SODIUM 100 MG PO CAPS
100.0000 mg | ORAL_CAPSULE | Freq: Two times a day (BID) | ORAL | Status: DC
  Administered 2022-05-20: 100 mg via ORAL
  Filled 2022-05-20 (×2): qty 1

## 2022-05-20 MED ORDER — ROCURONIUM BROMIDE 10 MG/ML (PF) SYRINGE
PREFILLED_SYRINGE | INTRAVENOUS | Status: DC | PRN
  Administered 2022-05-20: 60 mg via INTRAVENOUS
  Administered 2022-05-20: 20 mg via INTRAVENOUS

## 2022-05-20 MED ORDER — ZOLPIDEM TARTRATE 5 MG PO TABS: 5.0000 mg | ORAL_TABLET | Freq: Every evening | ORAL | Status: DC | PRN

## 2022-05-20 MED ORDER — HYDROMORPHONE HCL 1 MG/ML IJ SOLN: 0.5000 mg | INTRAMUSCULAR | Status: DC | PRN

## 2022-05-20 MED ORDER — CALCITRIOL 0.25 MCG PO CAPS
0.2500 ug | ORAL_CAPSULE | Freq: Every day | ORAL | Status: DC
  Administered 2022-05-20: 0.25 ug via ORAL
  Filled 2022-05-20: qty 1

## 2022-05-20 MED ORDER — PANTOPRAZOLE SODIUM 40 MG PO TBEC
40.0000 mg | DELAYED_RELEASE_TABLET | Freq: Two times a day (BID) | ORAL | Status: DC
  Administered 2022-05-20: 40 mg via ORAL
  Filled 2022-05-20 (×2): qty 1

## 2022-05-20 MED ORDER — THROMBIN 20000 UNITS EX SOLR: CUTANEOUS | Status: DC | PRN

## 2022-05-20 MED ORDER — OXYCODONE HCL 5 MG PO TABS
5.0000 mg | ORAL_TABLET | Freq: Once | ORAL | Status: AC | PRN
  Administered 2022-05-20: 5 mg via ORAL

## 2022-05-20 MED ORDER — ONDANSETRON 4 MG PO TBDP: 8.0000 mg | ORAL_TABLET | Freq: Three times a day (TID) | ORAL | Status: DC | PRN

## 2022-05-20 MED ORDER — HYDROMORPHONE HCL 1 MG/ML IJ SOLN
INTRAMUSCULAR | Status: AC
  Filled 2022-05-20: qty 1

## 2022-05-20 MED ORDER — MENTHOL 3 MG MT LOZG: 1.0000 | LOZENGE | OROMUCOSAL | Status: DC | PRN

## 2022-05-20 MED ORDER — PROPOFOL 10 MG/ML IV BOLUS
INTRAVENOUS | Status: DC | PRN
  Administered 2022-05-20: 200 mg via INTRAVENOUS

## 2022-05-20 MED ORDER — DEXMEDETOMIDINE HCL IN NACL 80 MCG/20ML IV SOLN
INTRAVENOUS | Status: DC | PRN
  Administered 2022-05-20: 8 ug via BUCCAL

## 2022-05-20 MED ORDER — ONDANSETRON HCL 4 MG PO TABS: 4.0000 mg | ORAL_TABLET | Freq: Four times a day (QID) | ORAL | Status: DC | PRN

## 2022-05-20 MED ORDER — HYDROXYZINE HCL 50 MG PO TABS
50.0000 mg | ORAL_TABLET | Freq: Every day | ORAL | Status: DC
  Administered 2022-05-20: 50 mg via ORAL
  Filled 2022-05-20: qty 1

## 2022-05-20 MED ORDER — INSULIN ASPART 100 UNIT/ML IJ SOLN
0.0000 [IU] | INTRAMUSCULAR | Status: DC
  Administered 2022-05-20: 7 [IU] via SUBCUTANEOUS
  Administered 2022-05-21 (×3): 4 [IU] via SUBCUTANEOUS

## 2022-05-20 MED ORDER — SODIUM CHLORIDE 0.9% FLUSH
3.0000 mL | Freq: Two times a day (BID) | INTRAVENOUS | Status: DC
  Administered 2022-05-20: 3 mL via INTRAVENOUS

## 2022-05-20 MED ORDER — 0.9 % SODIUM CHLORIDE (POUR BTL) OPTIME
TOPICAL | Status: DC | PRN
  Administered 2022-05-20: 1000 mL

## 2022-05-20 MED ORDER — BACITRACIN ZINC 500 UNIT/GM EX OINT
TOPICAL_OINTMENT | CUTANEOUS | Status: AC
  Filled 2022-05-20: qty 28.35

## 2022-05-20 MED ORDER — ROCURONIUM BROMIDE 10 MG/ML (PF) SYRINGE
PREFILLED_SYRINGE | INTRAVENOUS | Status: AC
  Filled 2022-05-20: qty 10

## 2022-05-20 MED ORDER — LOSARTAN POTASSIUM 50 MG PO TABS
100.0000 mg | ORAL_TABLET | Freq: Every day | ORAL | Status: DC
  Filled 2022-05-20: qty 2

## 2022-05-20 MED ORDER — VANCOMYCIN HCL 1000 MG IV SOLR
1000.0000 mg | Freq: Once | INTRAVENOUS | Status: AC
  Administered 2022-05-20: 1000 mg via INTRAVENOUS
  Filled 2022-05-20: qty 20

## 2022-05-20 MED ORDER — CYCLOBENZAPRINE HCL 5 MG PO TABS: 5.0000 mg | ORAL_TABLET | Freq: Three times a day (TID) | ORAL | Status: DC | PRN

## 2022-05-20 MED ORDER — PROMETHAZINE HCL 25 MG/ML IJ SOLN: 6.2500 mg | INTRAMUSCULAR | Status: DC | PRN

## 2022-05-20 MED ORDER — BUPIVACAINE-EPINEPHRINE (PF) 0.5% -1:200000 IJ SOLN
INTRAMUSCULAR | Status: AC
  Filled 2022-05-20: qty 30

## 2022-05-20 MED ORDER — PROPOFOL 500 MG/50ML IV EMUL
INTRAVENOUS | Status: DC | PRN
  Administered 2022-05-20: 25 ug/kg/min via INTRAVENOUS

## 2022-05-20 MED ORDER — FENTANYL CITRATE (PF) 250 MCG/5ML IJ SOLN
INTRAMUSCULAR | Status: AC
  Filled 2022-05-20: qty 5

## 2022-05-20 MED ORDER — HYDROMORPHONE HCL 1 MG/ML IJ SOLN
0.2500 mg | INTRAMUSCULAR | Status: DC | PRN
  Administered 2022-05-20: 0.5 mg via INTRAVENOUS
  Administered 2022-05-20 (×2): 0.25 mg via INTRAVENOUS
  Administered 2022-05-20 (×2): 0.5 mg via INTRAVENOUS

## 2022-05-20 MED ORDER — LIDOCAINE 2% (20 MG/ML) 5 ML SYRINGE
INTRAMUSCULAR | Status: AC
  Filled 2022-05-20: qty 5

## 2022-05-20 MED ORDER — SODIUM CHLORIDE 0.9 % IV SOLN: 250.0000 mL | INTRAVENOUS | Status: DC

## 2022-05-20 MED ORDER — THROMBIN 20000 UNITS EX SOLR
CUTANEOUS | Status: AC
  Filled 2022-05-20: qty 20000

## 2022-05-20 MED ORDER — ACETAMINOPHEN 325 MG PO TABS: 650.0000 mg | ORAL_TABLET | ORAL | Status: DC | PRN

## 2022-05-20 MED ORDER — OXYCODONE HCL 5 MG/5ML PO SOLN: 5.0000 mg | Freq: Once | ORAL | Status: AC | PRN

## 2022-05-20 MED ORDER — ACETAMINOPHEN 650 MG RE SUPP: 650.0000 mg | RECTAL | Status: DC | PRN

## 2022-05-20 MED ORDER — METOPROLOL SUCCINATE ER 100 MG PO TB24
100.0000 mg | ORAL_TABLET | Freq: Every day | ORAL | Status: DC
  Administered 2022-05-20: 100 mg via ORAL
  Filled 2022-05-20: qty 1

## 2022-05-20 MED ORDER — MIRABEGRON ER 25 MG PO TB24
25.0000 mg | ORAL_TABLET | Freq: Every day | ORAL | Status: DC
  Filled 2022-05-20: qty 1

## 2022-05-20 MED ORDER — SODIUM CHLORIDE 0.9% FLUSH: 3.0000 mL | INTRAVENOUS | Status: DC | PRN

## 2022-05-20 MED ORDER — DIAZEPAM 5 MG PO TABS: 10.0000 mg | ORAL_TABLET | Freq: Four times a day (QID) | ORAL | Status: DC | PRN

## 2022-05-20 MED ORDER — BUPIVACAINE-EPINEPHRINE (PF) 0.5% -1:200000 IJ SOLN
INTRAMUSCULAR | Status: DC | PRN
  Administered 2022-05-20: 30 mL

## 2022-05-20 MED ORDER — BISACODYL 10 MG RE SUPP: 10.0000 mg | Freq: Every day | RECTAL | Status: DC | PRN

## 2022-05-20 MED ORDER — OXYCODONE HCL 5 MG PO TABS: 5.0000 mg | ORAL_TABLET | ORAL | Status: DC | PRN

## 2022-05-20 SURGICAL SUPPLY — 47 items
APL SKNCLS STERI-STRIP NONHPOA (GAUZE/BANDAGES/DRESSINGS) ×1
BAG COUNTER SPONGE SURGICOUNT (BAG) ×2 IMPLANT
BAG SPNG CNTER NS LX DISP (BAG) ×2
BAND INSRT 18 STRL LF DISP RB (MISCELLANEOUS) ×2
BAND RUBBER #18 3X1/16 STRL (MISCELLANEOUS) ×4 IMPLANT
BENZOIN TINCTURE PRP APPL 2/3 (GAUZE/BANDAGES/DRESSINGS) ×2 IMPLANT
BLADE CLIPPER SURG (BLADE) IMPLANT
BUR MATCHSTICK NEURO 3.0 LAGG (BURR) ×2 IMPLANT
BUR PRECISION FLUTE 6.0 (BURR) ×2 IMPLANT
CANISTER SUCT 3000ML PPV (MISCELLANEOUS) ×2 IMPLANT
DRAPE LAPAROTOMY 100X72X124 (DRAPES) ×2 IMPLANT
DRAPE MICROSCOPE SLANT 54X150 (MISCELLANEOUS) ×2 IMPLANT
DRAPE SURG 17X23 STRL (DRAPES) ×8 IMPLANT
DRSG OPSITE POSTOP 4X6 (GAUZE/BANDAGES/DRESSINGS) IMPLANT
ELECT BLADE 4.0 EZ CLEAN MEGAD (MISCELLANEOUS) ×1
ELECT REM PT RETURN 9FT ADLT (ELECTROSURGICAL) ×1
ELECTRODE BLDE 4.0 EZ CLN MEGD (MISCELLANEOUS) ×2 IMPLANT
ELECTRODE REM PT RTRN 9FT ADLT (ELECTROSURGICAL) ×2 IMPLANT
GAUZE 4X4 16PLY ~~LOC~~+RFID DBL (SPONGE) IMPLANT
GAUZE SPONGE 4X4 12PLY STRL (GAUZE/BANDAGES/DRESSINGS) ×2 IMPLANT
GLOVE BIO SURGEON STRL SZ8 (GLOVE) ×2 IMPLANT
GLOVE BIO SURGEON STRL SZ8.5 (GLOVE) ×2 IMPLANT
GLOVE EXAM NITRILE XL STR (GLOVE) IMPLANT
GOWN STRL REUS W/ TWL LRG LVL3 (GOWN DISPOSABLE) IMPLANT
GOWN STRL REUS W/ TWL XL LVL3 (GOWN DISPOSABLE) ×2 IMPLANT
GOWN STRL REUS W/TWL 2XL LVL3 (GOWN DISPOSABLE) IMPLANT
GOWN STRL REUS W/TWL LRG LVL3 (GOWN DISPOSABLE)
GOWN STRL REUS W/TWL XL LVL3 (GOWN DISPOSABLE) ×1
HEMOSTAT POWDER KIT SURGIFOAM (HEMOSTASIS) ×2 IMPLANT
KIT BASIN OR (CUSTOM PROCEDURE TRAY) ×2 IMPLANT
KIT TURNOVER KIT B (KITS) ×2 IMPLANT
NEEDLE HYPO 22GX1.5 SAFETY (NEEDLE) ×2 IMPLANT
NS IRRIG 1000ML POUR BTL (IV SOLUTION) ×2 IMPLANT
PACK LAMINECTOMY NEURO (CUSTOM PROCEDURE TRAY) ×2 IMPLANT
PAD ARMBOARD 7.5X6 YLW CONV (MISCELLANEOUS) ×6 IMPLANT
PATTIES SURGICAL .5 X1 (DISPOSABLE) IMPLANT
SPONGE SURGIFOAM ABS GEL 100 (HEMOSTASIS) IMPLANT
SPONGE SURGIFOAM ABS GEL SZ50 (HEMOSTASIS) IMPLANT
STRIP CLOSURE SKIN 1/2X4 (GAUZE/BANDAGES/DRESSINGS) ×2 IMPLANT
SUT ETHILON 2 0 PSLX (SUTURE) IMPLANT
SUT PROLENE 6 0 BV (SUTURE) IMPLANT
SUT VIC AB 1 CT1 18XBRD ANBCTR (SUTURE) ×2 IMPLANT
SUT VIC AB 1 CT1 8-18 (SUTURE) ×1
SUT VIC AB 2-0 CP2 18 (SUTURE) ×2 IMPLANT
TOWEL GREEN STERILE (TOWEL DISPOSABLE) ×2 IMPLANT
TOWEL GREEN STERILE FF (TOWEL DISPOSABLE) ×2 IMPLANT
WATER STERILE IRR 1000ML POUR (IV SOLUTION) ×2 IMPLANT

## 2022-05-20 NOTE — H&P (Signed)
Subjective: The patient is a 76 year old white female on whom I performed a lumbar fusion.  She is developed recurrent back and left leg pain consistent with a lumbar radiculopathy.  She has failed medical management.  She was worked up with a lumbar MRI which demonstrated a large herniated disc at L3-4 on the left.  I discussed the various treatment options with her.  She has decided to proceed with surgery.  Past Medical History:  Diagnosis Date   Anemia    Arthritis    Bladder incontinence    Broken foot    Cataracts, bilateral    Chronic kidney insufficiency    COPD (chronic obstructive pulmonary disease) ([place])    wheezing   COVID 2021   very mild case   CVA (cerebral vascular accident) ([place]) 2016   has had 3 strokes, states right side is slightlyweaker than left   Diabetes mellitus    insulin dependent, Type 2   GERD (gastroesophageal reflux disease)    HLD (hyperlipidemia)    Hx of cardiovascular stress test    a. ETT (6/13):  Ex 5:13; no ischemic changes   Hypertension    controlled on meds   Lacunar stroke of left subthalamic region [facility]) 02/2015   Leg pain    left   Lower back pain    Neuromuscular disorder (HCC)    stroke right hand tingling   Orthostatic hypotension    Osteopenia 01/2017   T score -2.0 stable from prior DEXA   Pancreatitis 10/2021   PCOS (polycystic ovarian syndrome)    Personal history of tobacco use, presenting hazards to health 01/09/2015   PONV (postoperative nausea and vomiting)    Sleep apnea    borderline/ CPAP    Past Surgical History:  Procedure Laterality Date   BOTOX INJECTION  01/15/2022   Procedure: BOTOX INJECTION;  Surgeon: Clarene Essex, MD;  Location: [facility];  Service: Gastroenterology;;   Delphina Cahill LIFT Bilateral 11/04/2017   Procedure: BLEPHAROPLASTY UPPER EYELID W/EXCESS SKIN;  Surgeon: Karle Starch, MD;  Location: [place];  Service: Ophthalmology;  Laterality: Bilateral;  DIABETES-insulin dependent uses CPAP    CARDIAC CATHETERIZATION  20 yrs ago   found nothing   CARPAL TUNNEL RELEASE Bilateral    CATARACT EXTRACTION Bilateral    ELBOW SURGERY Bilateral    ESOPHAGOGASTRODUODENOSCOPY N/A 07/25/2021   Procedure: ESOPHAGOGASTRODUODENOSCOPY (EGD);  Surgeon: Toledo, Benay Pike, MD;  Location: [facility];  Service: Gastroenterology;  Laterality: N/A;  IDDM   ESOPHAGOGASTRODUODENOSCOPY N/A 01/15/2022   Procedure: ESOPHAGOGASTRODUODENOSCOPY (EGD);  Surgeon: Clarene Essex, MD;  Location: [facility];  Service: Gastroenterology;  Laterality: N/A;  botox   FOOT SURGERY     Groin Abscess     HAND SURGERY     KNEE SURGERY Bilateral    LABIAL ABSCESS     LUMBAR LAMINECTOMY/DECOMPRESSION MICRODISCECTOMY Left 05/11/2018   Procedure: LUMBAR LAMINECTOMY/DECOMPRESSION MICRODISCECTOMY 1 LEVEL- L4-5;  Surgeon: [name], MD;  Location: [facility];  Service: Neurosurgery;  Laterality: Left;   OOPHORECTOMY     BSO   PUBO VAG SLING     SHOULDER SURGERY     bilateral arthroscopies   VAGINAL HYSTERECTOMY  1979    Allergies  Allergen Reactions   Iodine Anaphylaxis and Other (See Comments)    Pt states that she is allergic to ingested iodine only, okay for betadine.     Shellfish Allergy Anaphylaxis   Codeine Nausea And Vomiting   Morphine Sulfate Nausea And Vomiting   Irbesartan Other (See  Comments)     Unknown  (Avapro)   Sulfa Antibiotics Other (See Comments)    Fever     Social History   Tobacco Use   Smoking status: Every Day    Packs/day: 1.50    Years: 50.00    Total pack years: 75.00    Types: Cigarettes   Smokeless tobacco: Never   Tobacco comments:    3ppd x 10 year, then cut back to 1.5pdd since 02/2015  Substance Use Topics   Alcohol use: Never    Alcohol/week: 0.0 standard drinks of alcohol    Family History  Problem Relation Age of Onset   Hypertension Mother    Heart disease Mother 54       MI   Diabetes Father    Diabetes Sister    Hypertension Sister    Diabetes Brother     Hypertension Brother    Heart disease Brother 47       CAD   Cancer Sister        Multiple myloma   Diabetes Brother    Prior to Admission medications   Medication Sig Start Date End Date Taking? Authorizing Provider  acetaminophen (TYLENOL) 500 MG tablet Take 1,000 mg by mouth every 6 (six) hours as needed for moderate pain.   Yes [provider]  aspirin EC 81 MG tablet Take 81 mg by mouth at bedtime. Swallow whole.   Yes [provider]  calcitRIOL (ROCALTROL) 0.25 MCG capsule Take 0.25 mcg by mouth at bedtime. 08/17/21  Yes [provider]  clopidogrel (PLAVIX) 75 MG tablet Take 75 mg by mouth at bedtime.   Yes [provider]  D-MANNOSE PO Take 2 tablets by mouth at bedtime.   Yes [provider]  diazepam (VALIUM) 10 MG tablet Take 10 mg by mouth every 6 (six) hours as needed for anxiety.   Yes [provider]  estradiol (ESTRACE) 0.1 MG/GM vaginal cream Place 1 Applicatorful vaginally 2 (two) times a week. 11/23/21  Yes [provider]  hydrOXYzine (ATARAX) 50 MG tablet Take 50 mg by mouth at bedtime.   Yes [provider]  insulin detemir (LEVEMIR) 100 UNIT/ML injection Inject 0.3-0.6 mLs (30-60 Units total) into the skin daily. Patient taking differently: Inject 15 Units into the skin daily. 11/12/21  Yes Wieting, Richard, MD  losartan (COZAAR) 100 MG tablet Take 1 tablet (100 mg total) by mouth daily. Patient taking differently: Take 100 mg by mouth at bedtime. 09/26/15  Yes Gladstone Lighter, MD  metoprolol succinate (TOPROL-XL) 100 MG 24 hr tablet Take 100 mg by mouth at bedtime.   Yes [provider]  Multiple Vitamins-Minerals (PRESERVISION AREDS 2+MULTI VIT PO) Take 1 capsule by mouth at bedtime.   Yes [provider]  nitrofurantoin, macrocrystal-monohydrate, (MACROBID) 100 MG capsule Take 100 mg by mouth at bedtime.   Yes [provider]  OVER THE COUNTER MEDICATION Take 2 tablets by  mouth at bedtime. Cranberry complex   Yes [provider]  pantoprazole (PROTONIX) 40 MG tablet Take 40 mg by mouth 2 (two) times daily. 07/06/21  Yes [provider]  sucralfate (CARAFATE) 1 g tablet Take 1 g by mouth 2 (two) times daily.   Yes [provider]  Vibegron (GEMTESA) 75 MG TABS Take 75 mg by mouth at bedtime.   Yes [provider]  zolpidem (AMBIEN) 10 MG tablet Take 10 mg by mouth at bedtime as needed for sleep. 12/20/21  Yes [provider]  BD INSULIN SYRINGE U/F 31G X 5/16" 1 ML MISC USE 1 SYRINGE AS DIRECTED 07/10/20   [provider]  ondansetron (ZOFRAN-ODT) 8 MG disintegrating tablet Take 1 tablet (8 mg total) by mouth every 8 (eight) hours as needed for nausea or vomiting. Patient not taking: Reported on 05/16/2022 11/12/21   Loletha Grayer, MD  [facility] ULTRA test strip 4 (four) times daily. 07/11/20   [provider]     Review of Systems  Positive ROS: As above  All other systems have been reviewed and were otherwise negative with the exception of those mentioned in the HPI and as above.  Objective: Vital signs in last 24 hours: Temp:  [97.6 F (36.4 C)] 97.6 F (36.4 C) (12/04 1106) Pulse Rate:  [71] 71 (12/04 1106) Resp:  [17] 17 (12/04 1106) BP: (201-215)/(71-79) 215/71 (12/04 1122) SpO2:  [95 %] 95 % (12/04 1106) Weight:  [75.3 kg] 75.3 kg (12/04 1106) Estimated body mass index is 26 kg/m as calculated from the following:   Height as of this encounter: '5\' 7"'$  (1.702 m).   Weight as of this encounter: 75.3 kg.   General Appearance: Alert Head: Normocephalic, without obvious abnormality, atraumatic Eyes: PERRL, conjunctiva/corneas clear, EOM's intact,    Ears: Normal  Throat: Normal  Neck: Supple, Back: Patient's lumbar incision is well-healed. Lungs: Clear to auscultation bilaterally, respirations unlabored Heart: Regular rate and rhythm, no murmur, rub or gallop Abdomen: Soft,  non-tender Extremities: Extremities normal, atraumatic, no cyanosis or edema Skin: unremarkable  NEUROLOGIC:   Mental status: alert and oriented,Motor Exam - grossly normal Sensory Exam - grossly normal Reflexes:  Coordination - grossly normal Gait - grossly normal Balance - grossly normal Cranial Nerves: I: smell Not tested  II: visual acuity  OS: Normal  OD: Normal   II: visual fields Full to confrontation  II: pupils Equal, round, reactive to light  III,VII: ptosis None  III,IV,VI: extraocular muscles  Full ROM  V: mastication Normal  V: facial light touch sensation  Normal  V,VII: corneal reflex  Present  VII: facial muscle function - upper  Normal  VII: facial muscle function - lower Normal  VIII: hearing Not tested  IX: soft palate elevation  Normal  IX,X: gag reflex Present  XI: trapezius strength  5/5  XI: sternocleidomastoid strength 5/5  XI: neck flexion strength  5/5  XII: tongue strength  Normal    Data Review Lab Results  Component Value Date   WBC 10.2 11/12/2021   HGB 8.7 (L) 11/12/2021   HCT 26.4 (L) 11/12/2021   MCV 91.7 11/12/2021   PLT 242 11/12/2021   Lab Results  Component Value Date   NA 133 (L) 11/12/2021   K 4.5 11/12/2021   CL 108 11/12/2021   CO2 17 (L) 11/12/2021   BUN 28 (H) 11/12/2021   CREATININE 1.62 (H) 11/12/2021   GLUCOSE 86 11/12/2021   Lab Results  Component Value Date   INR 1.04 05/06/2018    Assessment/Plan: Left L3-4 herniated disc, lumbago, lumbar radiculopathy: I have discussed the situation with the patient.  I reviewed her imaging studies with her and pointed out the abnormalities.  We have discussed the various treatment options including surgery.  I described the surgical treatment option of the left L3-4 discectomy.  I have shown her surgical models.  I have given her a surgical pamphlet.  We have discussed the risk, benefits, alternatives, expected postop course, and likelihood of achieving our goals with  surgery.  I have  answered all her questions.  She has decided proceed with surgery.   [name] 05/20/2022 1:06 PM

## 2022-05-20 NOTE — Anesthesia Postprocedure Evaluation (Signed)
Anesthesia Post Note  Patient: Darylene Price Ciaravino  Procedure(s) Performed: MICRODISCECTOMY L3-4 (Left)     Patient location during evaluation: PACU Anesthesia Type: General Level of consciousness: sedated, patient cooperative and oriented Pain management: pain level controlled Vital Signs Assessment: post-procedure vital signs reviewed and stable Respiratory status: spontaneous breathing, nonlabored ventilation and respiratory function stable Cardiovascular status: blood pressure returned to baseline and stable Postop Assessment: no apparent nausea or vomiting and able to ambulate (sitting in recliner) Anesthetic complications: no   No notable events documented.  Last Vitals:  Vitals:   05/20/22 1715 05/20/22 1745  BP: (!) 162/59 (!) 154/61  Pulse: 64 (!) 57  Resp: 12 10  Temp: 36.4 C   SpO2: 95% 96%    Last Pain:  Vitals:   05/20/22 1715  TempSrc:   PainSc: Asleep                 Dania Marsan,E. Keontae Levingston

## 2022-05-20 NOTE — Op Note (Addendum)
Brief history: The patient is a 76 year old white female on whom I performed an L4-5 and L5-S1 decompression, instrumentation and fusion in April 2023.  She developed recurrent back and left leg pain consistent with a lumbar radiculopathy.  She failed medical management.  She was worked up with a lumbar MRI which demonstrated a large herniated disc at L3-4 on the left.  I discussed the various treatment options with her.  She has decided to proceed with surgery.  Preoperative diagnosis: Left L3-4 herniated disc, lumbar radiculopathy, lumbago  Postoperative diagnosis: The same  Procedure: Left L3-4 redo intervertebral discectomy using micro-dissection  Surgeon: Dr. Earle Gell  Asst.: Pieter Partridge Dawley and Arnetha Massy NP  Anesthesia: Gen. endotracheal  Estimated blood loss: 100 cc  Drains: None  Complications: None  Description of procedure: The patient was brought to the operating room by the anesthesia team. General endotracheal anesthesia was induced. The patient was turned to the prone position on the Wilson frame. The patient's lumbosacral region was then prepared with Betadine scrub and Betadine solution. Sterile drapes were applied.  I then injected the area to be incised with Marcaine with epinephrine solution. I then used a scalpel to make a linear midline incision over the L3-4 intervertebral disc space, incising through the old surgical scar.  I then used electrocautery to perform a left sided subperiosteal dissection exposing the spinous process and lamina of L3-4. We obtained intraoperative radiograph to confirm our location. I then inserted the [facility] retractor for exposure.  We then brought the operative microscope into the field. Under its magnification and illumination we completed the microdissection. I used a high-speed drill to perform a redo left L3-4 laminotomy. I then used a Kerrison punches to widen the laminotomy and removed the epidural scar tissue and L3-4. We then  used microdissection to free up the thecal sac and the left L4 nerve root from the epidural scar tissue. I then used a Kerrison punch to perform a foraminotomy at about the left L4 nerve root. We then using the nerve root retractor to gently retract the thecal sac and the left L4 nerve root medially. This exposed the large intervertebral disc herniation.  We removed it with the pituitary forceps.  I inspected the intervertebral disc at L3-4.  There did not seem any impending herniation.  I did not perform an intervertebral discectomy.  I then palpated along the ventral surface of the thecal sac and along exit route of the left L4 nerve root and noted that the neural structures were well decompressed. This completed the decompression.  We then obtained hemostasis using bipolar electrocautery. We irrigated the wound out with saline solution. We then removed the retractor. We then reapproximated the patient's thoracolumbar fascia with interrupted #1 Vicryl suture. We then reapproximated the patient's subcutaneous tissue with interrupted 2-0 Vicryl suture. We then reapproximated patient's skin with Steri-Strips and benzoin. The was then coated with bacitracin ointment. The drapes were removed. The patient was subsequently returned to the supine position where they were extubated by the anesthesia team. The patient was then transported to the postanesthesia care unit in stable condition. All sponge instrument and needle counts were reportedly correct at the end of this case.

## 2022-05-20 NOTE — Transfer of Care (Signed)
Immediate Anesthesia Transfer of Care Note  Patient: Mckenzie Lawrence  Procedure(s) Performed: MICRODISCECTOMY L3-4 (Left)  Patient Location: PACU  Anesthesia Type:General  Level of Consciousness: awake, alert , and drowsy  Airway & Oxygen Therapy: Patient Spontanous Breathing  Post-op Assessment: Report given to RN, Post -op Vital signs reviewed and stable, and Patient moving all extremities X 4  Post vital signs: Reviewed and stable  Last Vitals:  Vitals Value Taken Time  BP 199/64 05/20/22 1545  Temp    Pulse 78 05/20/22 1545  Resp 21 05/20/22 1545  SpO2 99 % 05/20/22 1545  Vitals shown include unvalidated device data.  Last Pain:  Vitals:   05/20/22 1122  TempSrc:   PainSc: 6       Patients Stated Pain Goal: 2 (35/36/14 4315)  Complications: No notable events documented.

## 2022-05-20 NOTE — Anesthesia Procedure Notes (Signed)
Procedure Name: Intubation Date/Time: 05/20/2022 1:24 PM  Performed by: Maude Leriche, CRNAPre-anesthesia Checklist: Patient identified, Emergency Drugs available, Suction available and Patient being monitored Patient Re-evaluated:Patient Re-evaluated prior to induction Oxygen Delivery Method: Circle system utilized Preoxygenation: Pre-oxygenation with 100% oxygen Induction Type: IV induction Ventilation: Mask ventilation without difficulty Laryngoscope Size: Miller and 2 Grade View: Grade II Tube type: Oral Tube size: 7.0 mm Number of attempts: 1 Airway Equipment and Method: Stylet Placement Confirmation: ETT inserted through vocal cords under direct vision, positive ETCO2 and breath sounds checked- equal and bilateral Secured at: 21 cm Tube secured with: Tape Dental Injury: Teeth and Oropharynx as per pre-operative assessment

## 2022-05-21 ENCOUNTER — Encounter (HOSPITAL_COMMUNITY): Payer: Self-pay | Admitting: Neurosurgery

## 2022-05-21 DIAGNOSIS — M5116 Intervertebral disc disorders with radiculopathy, lumbar region: Secondary | ICD-10-CM | POA: Diagnosis not present

## 2022-05-21 LAB — GLUCOSE, CAPILLARY
Glucose-Capillary: 183 mg/dL — ABNORMAL HIGH (ref 70–99)
Glucose-Capillary: 187 mg/dL — ABNORMAL HIGH (ref 70–99)
Glucose-Capillary: 172 mg/dL — ABNORMAL HIGH (ref 70–99)

## 2022-05-21 MED ORDER — PROMETHAZINE HCL 25 MG PO TABS: 25.0000 mg | ORAL_TABLET | Freq: Four times a day (QID) | ORAL | Status: DC | PRN

## 2022-05-21 MED ORDER — OXYCODONE-ACETAMINOPHEN 5-325 MG PO TABS: 1.0000 | ORAL_TABLET | ORAL | 0 refills | Status: DC | PRN

## 2022-05-21 MED ORDER — DOCUSATE SODIUM 100 MG PO CAPS: 100.0000 mg | ORAL_CAPSULE | Freq: Two times a day (BID) | ORAL | 0 refills | Status: DC

## 2022-05-21 MED ORDER — OXYCODONE-ACETAMINOPHEN 5-325 MG PO TABS: 1.0000 | ORAL_TABLET | ORAL | Status: DC | PRN

## 2022-05-21 MED ORDER — PROMETHAZINE HCL 25 MG PO TABS: 25.0000 mg | ORAL_TABLET | Freq: Four times a day (QID) | ORAL | 0 refills | Status: DC | PRN

## 2022-05-21 NOTE — Evaluation (Signed)
Physical Therapy Evaluation Patient Details Name: Mckenzie Lawrence MRN: [id number] DOB: June 28, 1945 Today's Date: 05/21/2022  History of Present Illness  Patient is a 76 y.o. female on who underwent L4-5 and L5-S1 decompression, instrumentation and fusion in April 2023. She developed recurrent back and left leg pain consistent with a lumbar radiculopathy. Worked up with lumbar MRI demonstrated large herniated disc at L3-4 on the left. Pt now s/p L3-4 redo intervertebral discectomy via micro-dissection on 05/20/22. PMH includes: cataracts, COPD, CVA, DM, arthritis, HTN, orthostatic hypotension, prior back surgery, bil shoulder surgery.    Clinical Impression  Mckenzie Lawrence is a 76 y.o. female POD 1 s/p L3-4 redo of microdiscectomy. Patient reports independence with rollator for mobility at baseline. Patient is now limited by functional impairments (see PT problem list below) and requires min guard/supervision for transfers and gait with RW. Patient was able to ambulate ~250 feet with RW and min guard/supervision. Patient instructed on spinal precautions and handout provided. Patient will benefit from continued skilled PT interventions to address impairments and progress towards PLOF. Acute PT will follow to progress mobility as able. Pt has excellent support at home and is mobilizing at safe level to discharge home with family assist.      Recommendations for follow up therapy are one component of a multi-disciplinary discharge planning process, led by the attending physician.  Recommendations may be updated based on patient status, additional functional criteria and insurance authorization.  Follow Up Recommendations Home health PT      Assistance Recommended at Discharge Intermittent Supervision/Assistance  Patient can return home with the following  A little help with walking and/or transfers;A little help with bathing/dressing/bathroom;Assistance with cooking/housework;Assist for  transportation;Help with stairs or ramp for entrance    Equipment Recommendations None recommended by PT  Recommendations for Other Services       Functional Status Assessment Patient has had a recent decline in their functional status and demonstrates the ability to make significant improvements in function in a reasonable and predictable amount of time.     Precautions / Restrictions Precautions Precautions: Fall;Back Precaution Booklet Issued: Yes (comment) Precaution Comments: BLT Restrictions Weight Bearing Restrictions: No      Mobility  Bed Mobility               General bed mobility comments: pt OOB at start of session    Transfers Overall transfer level: Needs assistance Equipment used: Rolling walker (2 wheels) Transfers: Sit to/from Stand Sit to Stand: Supervision           General transfer comment: supervision for safety with rise from EOB and recliner    Ambulation/Gait Ambulation/Gait assistance: Min guard, Supervision Gait Distance (Feet): 250 Feet Assistive device: Rolling walker (2 wheels) Gait Pattern/deviations: Step-through pattern, Decreased stride length, Drifts right/left Gait velocity: decr     General Gait Details: overall steady with no LOB noted. pt requried intermittent cues to prevent walker from moving to far ahead. as pt fatigued posture slightly flexed but able to correct with VC's.  Stairs            Wheelchair Mobility    Modified Rankin (Stroke Patients Only)       Balance Overall balance assessment: Needs assistance Sitting-balance support: Feet supported Sitting balance-Leahy Scale: Good     Standing balance support: Reliant on assistive device for balance, During functional activity, Bilateral upper extremity supported Standing balance-Leahy Scale: Fair  Pertinent Vitals/Pain Pain Assessment Pain Assessment: Faces Faces Pain Scale: Hurts a little bit Pain  Location: Lt lataral leg Pain Descriptors / Indicators: Aching, Discomfort Pain Intervention(s): Limited activity within patient's tolerance, Monitored during session, Repositioned    Home Living Family/patient expects to be discharged to:: Private residence Living Arrangements: Spouse/significant other Available Help at Discharge: Family;Available 24 hours/day Type of Home: Other(Comment) Home Access: Stairs to enter Entrance Stairs-Rails: Right;Left;Can reach both Entrance Stairs-Number of Steps: 4-5   Home Layout: One level Home Equipment: BSC/3in1;Shower seat - built in;Grab bars - toilet;Grab bars - tub/shower;Rollator (4 wheels);Rolling Walker (2 wheels) Additional Comments: pt lives with her spouse, will have assist from her daughter as well.    Prior Function Prior Level of Function : Independent/Modified Independent             Mobility Comments: Pt reports prior to back sx she was independent without AD. Since surgery pt has been ambulating with RW & receiving HHPT. ADLs Comments: reports ind ADLs, spouse completes IADLS     Hand Dominance   Dominant Hand: Right    Extremity/Trunk Assessment   Upper Extremity Assessment Upper Extremity Assessment: Overall WFL for tasks assessed    Lower Extremity Assessment Lower Extremity Assessment: Overall WFL for tasks assessed    Cervical / Trunk Assessment Cervical / Trunk Assessment: Back Surgery  Communication   Communication: No difficulties  Cognition Arousal/Alertness: Awake/alert Behavior During Therapy: WFL for tasks assessed/performed Overall Cognitive Status: Within Functional Limits for tasks assessed                                          General Comments      Exercises     Assessment/Plan    PT Assessment Patient needs continued PT services  PT Problem List Decreased strength;Decreased range of motion;Decreased activity tolerance;Decreased balance;Decreased mobility;Decreased  safety awareness;Decreased knowledge of precautions;Pain       PT Treatment Interventions DME instruction;Gait training;Stair training;Functional mobility training;Therapeutic activities;Therapeutic exercise;Balance training;Patient/family education    PT Goals (Current goals can be found in the Care Plan section)  Acute Rehab PT Goals Patient Stated Goal: get home today PT Goal Formulation: With patient/family Time For Goal Achievement: 05/28/22 Potential to Achieve Goals: Good    Frequency Min 3X/week     Co-evaluation               AM-PAC PT "6 Clicks" Mobility  Outcome Measure Help needed turning from your back to your side while in a flat bed without using bedrails?: A Little Help needed moving from lying on your back to sitting on the side of a flat bed without using bedrails?: A Little Help needed moving to and from a bed to a chair (including a wheelchair)?: A Little Help needed standing up from a chair using your arms (e.g., wheelchair or bedside chair)?: A Little Help needed to walk in hospital room?: A Little Help needed climbing 3-5 steps with a railing? : A Little 6 Click Score: 18    End of Session Equipment Utilized During Treatment: Gait belt Activity Tolerance: Patient tolerated treatment well Patient left: in chair;with call bell/phone within reach;with family/visitor present Nurse Communication: Mobility status PT Visit Diagnosis: Unsteadiness on feet (R26.81);Muscle weakness (generalized) (M62.81);Difficulty in walking, not elsewhere classified (R26.2)    Time: 8338-2505 PT Time Calculation (min) (ACUTE ONLY): 29 min   Charges:   PT Evaluation $  PT Eval Low Complexity: 1 Low PT Treatments $Gait Training: 8-22 mins        Verner Mould, DPT Acute Rehabilitation Services Office [phone]  05/21/22 9:58 AM

## 2022-05-21 NOTE — TOC Initial Note (Signed)
Transition of Care (TOC) - Initial/Assessment Note  Spoke to patient and daughter at bedside. Patient from home with husband. Has DME at home, walkers, raised commode seat, walk in shower etc   MD office has arranged HHPT with [facility] , confirmed with Amy with [facility] . Patient in agreement and requesting HHPT Stacey . Amy aware of request.  Patient Details  Name: Mckenzie Lawrence MRN: [id number] Date of Birth: [birth year]  Transition of Care [facility]) CM/SW Contact:    Marilu Favre, RN Phone Number: 05/21/2022, 8:46 AM  Clinical Narrative:                 R  Expected Discharge Plan: [place] Barriers to Discharge: No Barriers Identified (PT eval)   Patient Goals and CMS Choice Patient states their goals for this hospitalization and ongoing recovery are:: to return to home CMS Medicare.gov Compare Post Acute Care list provided to:: Patient Choice offered to / list presented to : Patient, Adult Children  Expected Discharge Plan and Services Expected Discharge Plan: [place]   Discharge Planning Services: CM Consult Post Acute Care Choice: [place] arrangements for the past 2 months: Single Family Home Expected Discharge Date: 05/21/22               DME Arranged: N/A         HH Arranged: PT [place]: [place] Date [place]: 05/21/22 Time [place]: [phone] Representative spoke with at [place]: AMy  Prior Living Arrangements/Services Living arrangements for the past 2 months: [place] with:: Spouse Patient language and need for interpreter reviewed:: Yes Do you feel safe going back to the place where you live?: Yes      Need for Family Participation in Patient Care: Yes (Comment) Care giver support system in place?: Yes (comment) Current home services: DME Criminal Activity/Legal Involvement Pertinent to Current Situation/Hospitalization: No - Comment as needed  Activities  of Daily Living Home Assistive Devices/Equipment: Environmental consultant (specify type), Wheelchair, Radio producer (specify quad or straight), Blood pressure cuff, CBG Meter, Eyeglasses ADL Screening (condition at time of admission) Patient's cognitive ability adequate to safely complete daily activities?: Yes Is the patient deaf or have difficulty hearing?: Yes Does the patient have difficulty seeing, even when wearing glasses/contacts?: Yes Does the patient have difficulty concentrating, remembering, or making decisions?: No Patient able to express need for assistance with ADLs?: Yes Does the patient have difficulty dressing or bathing?: No Independently performs ADLs?: Yes (appropriate for developmental age) Does the patient have difficulty walking or climbing stairs?: Yes Weakness of Legs: Both Weakness of Arms/Hands: None  Permission Sought/Granted   Permission granted to share information with : Yes, Verbal Permission Granted  Share Information with NAME: daughter at bedside           Emotional Assessment Appearance:: Appears stated age Attitude/Demeanor/Rapport: Engaged Affect (typically observed): Accepting Orientation: : Oriented to Place, Oriented to  Time, Oriented to Situation, Oriented to Self Alcohol / Substance Use: Not Applicable Psych Involvement: No (comment)  Admission diagnosis:  Recurrent displacement of lumbar disc [M51.26] Patient Active Problem List   Diagnosis Date Noted   Recurrent displacement of lumbar disc 05/20/2022   Acute cystitis without hematuria    Acute pancreatitis 11/10/2021   Acute lower UTI 11/10/2021   Diabetes mellitus with renal manifestations, controlled ([place]) 11/10/2021   CKD (chronic kidney disease) stage 4, GFR 15-29 ml/min (HCC) 11/10/2021   Lumbar spinal  stenosis 10/01/2021   Situational anxiety 07/08/2019   Chronic kidney disease, stage 3 unspecified ([place]) 03/01/2019   Edema of lower extremity 03/01/2019   Proteinuria 03/01/2019   Encounter for  orthopedic follow-up care 11/04/2018   CAD (coronary artery disease) 10/26/2018   Pain in right hand 07/22/2018   Degeneration of lumbar intervertebral disc 02/06/2018   Scoliosis deformity of spine 02/06/2018   Spondylolisthesis, grade 1 02/06/2018   Low back pain 02/04/2018   Spinal stenosis of lumbar region 02/04/2018   Trigger finger 01/26/2018   CVA (cerebral vascular accident) ([place]) 03/25/2016   OSA (obstructive sleep apnea) 02/15/2016   Hyponatremia 09/25/2015   Vitamin B12 deficiency 09/12/2015   Lacunar stroke of left subthalamic region [facility]) 09/12/2015   Diabetic polyneuropathy associated with diabetes mellitus due to underlying condition ([place]) 09/12/2015   Stroke ([place]) 03/15/2015   Accelerated hypertension 03/15/2015   Vertigo 09/24/2013   Female stress incontinence 02/10/2013   Incomplete emptying of bladder 02/10/2013   Increased frequency of urination 02/10/2013   Urge incontinence 02/10/2013   Syncope 12/04/2011   Tobacco abuse 12/04/2011   Elevated cholesterol    Cataracts, bilateral    Hypertension    Broken foot    PCOS (polycystic ovarian syndrome)    Type 2 diabetes mellitus with hyperlipidemia ([place])    Osteopenia    PCP:  [place] Pink, MD Pharmacy:   [place], [place] - [place] [address] Phone: [phone] Fax: [phone]  [facility] [place], [place] [address] AT [place] [facility] [place] [address] Phone: [phone] Fax: [phone]     Social Determinants of Health (SDOH) Interventions    Readmission Risk Interventions    11/10/2021    2:49 PM  Readmission Risk Prevention Plan  Transportation Screening Complete  PCP or Specialist Appt within 3-5 Days Complete  HRI or Home Care Consult Complete  Social Work Consult for [place] Planning/Counseling Complete  Palliative Care Screening Not Applicable  Medication Review  Press photographer) Complete

## 2022-05-21 NOTE — Progress Notes (Signed)
Patient and daughter given all discharge instructions and verbalized understanding. PIV removed and patient dressed self. Patient discharged with daughter via wheelchair.

## 2022-05-21 NOTE — Progress Notes (Signed)
OT Cancellation Note  Patient Details Name: Mckenzie Lawrence MRN: [id number] DOB: 1945-08-24   Cancelled Treatment:    Reason Eval/Treat Not Completed: OT screened, no needs identified, will sign off  Ailene Ravel, OTR/L,CBIS  Supplemental OT - [facility] and [facility]  05/21/2022, 9:19 AM

## 2022-05-21 NOTE — Plan of Care (Signed)
Problem: Education: Goal: Knowledge of General Education information will improve Description: Including pain rating scale, medication(s)/side effects and non-pharmacologic comfort measures Outcome: Adequate for Discharge   Problem: Health Behavior/Discharge Planning: Goal: Ability to manage health-related needs will improve Outcome: Adequate for Discharge   Problem: Clinical Measurements: Goal: Ability to maintain clinical measurements within normal limits will improve Outcome: Adequate for Discharge Goal: Will remain free from infection Outcome: Adequate for Discharge Goal: Diagnostic test results will improve Outcome: Adequate for Discharge Goal: Respiratory complications will improve Outcome: Adequate for Discharge Goal: Cardiovascular complication will be avoided Outcome: Adequate for Discharge   Problem: Activity: Goal: Risk for activity intolerance will decrease Outcome: Adequate for Discharge   Problem: Nutrition: Goal: Adequate nutrition will be maintained Outcome: Adequate for Discharge   Problem: Coping: Goal: Level of anxiety will decrease Outcome: Adequate for Discharge   Problem: Elimination: Goal: Will not experience complications related to bowel motility Outcome: Adequate for Discharge Goal: Will not experience complications related to urinary retention Outcome: Adequate for Discharge   Problem: Pain Managment: Goal: General experience of comfort will improve Outcome: Adequate for Discharge   Problem: Safety: Goal: Ability to remain free from injury will improve Outcome: Adequate for Discharge   Problem: Skin Integrity: Goal: Risk for impaired skin integrity will decrease Outcome: Adequate for Discharge   Problem: Education: Goal: Ability to verbalize activity precautions or restrictions will improve Outcome: Adequate for Discharge Goal: Knowledge of the prescribed therapeutic regimen will improve Outcome: Adequate for Discharge Goal:  Understanding of discharge needs will improve Outcome: Adequate for Discharge   Problem: Activity: Goal: Ability to avoid complications of mobility impairment will improve Outcome: Adequate for Discharge Goal: Ability to tolerate increased activity will improve Outcome: Adequate for Discharge Goal: Will remain free from falls Outcome: Adequate for Discharge   Problem: Bowel/Gastric: Goal: Gastrointestinal status for postoperative course will improve Outcome: Adequate for Discharge   Problem: Clinical Measurements: Goal: Ability to maintain clinical measurements within normal limits will improve Outcome: Adequate for Discharge Goal: Postoperative complications will be avoided or minimized Outcome: Adequate for Discharge Goal: Diagnostic test results will improve Outcome: Adequate for Discharge   Problem: Pain Management: Goal: Pain level will decrease Outcome: Adequate for Discharge   Problem: Skin Integrity: Goal: Will show signs of wound healing Outcome: Adequate for Discharge   Problem: Health Behavior/Discharge Planning: Goal: Identification of resources available to assist in meeting health care needs will improve Outcome: Adequate for Discharge   Problem: Bladder/Genitourinary: Goal: Urinary functional status for postoperative course will improve Outcome: Adequate for Discharge   Problem: Education: Goal: Ability to describe self-care measures that may prevent or decrease complications (Diabetes Survival Skills Education) will improve Outcome: Adequate for Discharge Goal: Individualized Educational Video(s) Outcome: Adequate for Discharge   Problem: Coping: Goal: Ability to adjust to condition or change in health will improve Outcome: Adequate for Discharge   Problem: Fluid Volume: Goal: Ability to maintain a balanced intake and output will improve Outcome: Adequate for Discharge   Problem: Health Behavior/Discharge Planning: Goal: Ability to identify and  utilize available resources and services will improve Outcome: Adequate for Discharge Goal: Ability to manage health-related needs will improve Outcome: Adequate for Discharge   Problem: Metabolic: Goal: Ability to maintain appropriate glucose levels will improve Outcome: Adequate for Discharge   Problem: Nutritional: Goal: Maintenance of adequate nutrition will improve Outcome: Adequate for Discharge Goal: Progress toward achieving an optimal weight will improve Outcome: Adequate for Discharge   Problem: Skin Integrity: Goal: Risk for impaired  skin integrity will decrease Outcome: Adequate for Discharge   Problem: Tissue Perfusion: Goal: Adequacy of tissue perfusion will improve Outcome: Adequate for Discharge   Problem: Acute Rehab PT Goals(only PT should resolve) Goal: Pt Will Go Supine/Side To Sit Outcome: Adequate for Discharge Goal: Patient Will Transfer Sit To/From Stand Outcome: Adequate for Discharge Goal: Pt Will Ambulate Outcome: Adequate for Discharge Goal: Pt Will Verbalize and Adhere to Precautions While Description: PT Will Verbalize and Adhere to Precautions While Performing Mobility Outcome: Adequate for Discharge

## 2022-05-21 NOTE — Discharge Summary (Signed)
Physician Discharge Summary  Patient ID: Mckenzie Lawrence MRN: [id number] DOB/AGE: June 12, 1946 76 y.o.  Admit date: 05/20/2022 Discharge date: 05/21/2022  Admission Diagnoses: Lumbar herniated disc, lumbar radiculopathy, lumbago  Discharge Diagnoses: Same Principal Problem:   Recurrent displacement of lumbar disc   Discharged Condition: good  Hospital Course: I performed a redo left L3-4 discectomy on the patient on 05/20/2022.  The patient's postoperative course was unremarkable.  On 05/21/2022 the patient looked and felt well.  She requested discharge to home.  She requested home PT.  She was given written and verbal discharge instructions.  All her questions were answered.  Consults: PT, OT, care management Significant Diagnostic Studies: None Treatments: Redo left L3-4 discectomy using microdissection Discharge Exam: Blood pressure (!) 164/78, pulse 88, temperature 97.7 F (36.5 C), temperature source Oral, resp. rate 17, height '5\' 7"'$  (1.702 m), weight 75.3 kg, SpO2 95 %. The patient is alert and pleasant.  Her strength is normal.  She looks well.  Her dressing is clean and dry.  Disposition: Home  Discharge Instructions     Call MD for:  difficulty breathing, headache or visual disturbances   Complete by: As directed    Call MD for:  extreme fatigue   Complete by: As directed    Call MD for:  hives   Complete by: As directed    Call MD for:  persistant dizziness or light-headedness   Complete by: As directed    Call MD for:  persistant nausea and vomiting   Complete by: As directed    Call MD for:  redness, tenderness, or signs of infection (pain, swelling, redness, odor or green/yellow discharge around incision site)   Complete by: As directed    Call MD for:  severe uncontrolled pain   Complete by: As directed    Call MD for:  temperature >100.4   Complete by: As directed    Diet - low sodium heart healthy   Complete by: As directed    Discharge instructions    Complete by: As directed    Call [phone] for a followup appointment. Take a stool softener while you are using pain medications.   Driving Restrictions   Complete by: As directed    Do not drive for 2 weeks.   Increase activity slowly   Complete by: As directed    Lifting restrictions   Complete by: As directed    Do not lift more than 5 pounds. No excessive bending or twisting.   May shower / Bathe   Complete by: As directed    Remove the dressing for 3 days after surgery.  You may shower, but leave the incision alone.   Remove dressing in 48 hours   Complete by: As directed       Allergies as of 05/21/2022       Reactions   Iodine Anaphylaxis, Other (See Comments)   Pt states that she is allergic to ingested iodine only, okay for betadine.     Shellfish Allergy Anaphylaxis   Codeine Nausea And Vomiting   Morphine Sulfate Nausea And Vomiting   Irbesartan Other (See Comments)    Unknown  (Avapro)   Sulfa Antibiotics Other (See Comments)   Fever         Medication List     STOP taking these medications    acetaminophen 500 MG tablet Commonly known as: TYLENOL       TAKE these medications    aspirin EC 81 MG tablet Take 81  mg by mouth at bedtime. Swallow whole.   BD Insulin Syringe U/F 31G X 5/16" 1 ML Misc Generic drug: Insulin Syringe-Needle U-100 USE 1 SYRINGE AS DIRECTED   calcitRIOL 0.25 MCG capsule Commonly known as: ROCALTROL Take 0.25 mcg by mouth at bedtime.   clopidogrel 75 MG tablet Commonly known as: PLAVIX Take 75 mg by mouth at bedtime.   D-MANNOSE PO Take 2 tablets by mouth at bedtime.   diazepam 10 MG tablet Commonly known as: VALIUM Take 10 mg by mouth every 6 (six) hours as needed for anxiety.   docusate sodium 100 MG capsule Commonly known as: COLACE Take 1 capsule (100 mg total) by mouth 2 (two) times daily.   estradiol 0.1 MG/GM vaginal cream Commonly known as: ESTRACE Place 1 Applicatorful vaginally 2 (two) times a  week.   Gemtesa 75 MG Tabs Generic drug: Vibegron Take 75 mg by mouth at bedtime.   hydrOXYzine 50 MG tablet Commonly known as: ATARAX Take 50 mg by mouth at bedtime.   insulin detemir 100 UNIT/ML injection Commonly known as: LEVEMIR Inject 0.3-0.6 mLs (30-60 Units total) into the skin daily. What changed: how much to take   losartan 100 MG tablet Commonly known as: COZAAR Take 1 tablet (100 mg total) by mouth daily. What changed: when to take this   metoprolol succinate 100 MG 24 hr tablet Commonly known as: TOPROL-XL Take 100 mg by mouth at bedtime.   nitrofurantoin (macrocrystal-monohydrate) 100 MG capsule Commonly known as: MACROBID Take 100 mg by mouth at bedtime.   ondansetron 8 MG disintegrating tablet Commonly known as: ZOFRAN-ODT Take 1 tablet (8 mg total) by mouth every 8 (eight) hours as needed for nausea or vomiting.   OneTouch Ultra test strip Generic drug: glucose blood 4 (four) times daily.   OVER THE COUNTER MEDICATION Take 2 tablets by mouth at bedtime. Cranberry complex   oxyCODONE-acetaminophen 5-325 MG tablet Commonly known as: PERCOCET/ROXICET Take 1-2 tablets by mouth every 4 (four) hours as needed for moderate pain.   pantoprazole 40 MG tablet Commonly known as: PROTONIX Take 40 mg by mouth 2 (two) times daily.   PRESERVISION AREDS 2+MULTI VIT PO Take 1 capsule by mouth at bedtime.   promethazine 25 MG tablet Commonly known as: PHENERGAN Take 1 tablet (25 mg total) by mouth every 6 (six) hours as needed for nausea.   sucralfate 1 g tablet Commonly known as: CARAFATE Take 1 g by mouth 2 (two) times daily.   zolpidem 10 MG tablet Commonly known as: AMBIEN Take 10 mg by mouth at bedtime as needed for sleep.         Signed: [name] 05/21/2022, 7:52 AM

## 2022-05-22 LAB — HEMOGLOBIN A1C
Hgb A1c MFr Bld: 6.7 % — ABNORMAL HIGH (ref 4.8–5.6)
Mean Plasma Glucose: 146 mg/dL

## 2022-06-20 DIAGNOSIS — E1122 Type 2 diabetes mellitus with diabetic chronic kidney disease: Secondary | ICD-10-CM | POA: Diagnosis not present

## 2022-06-20 DIAGNOSIS — G4730 Sleep apnea, unspecified: Secondary | ICD-10-CM | POA: Diagnosis not present

## 2022-06-20 DIAGNOSIS — Z4789 Encounter for other orthopedic aftercare: Secondary | ICD-10-CM | POA: Diagnosis not present

## 2022-06-20 DIAGNOSIS — M1990 Unspecified osteoarthritis, unspecified site: Secondary | ICD-10-CM | POA: Diagnosis not present

## 2022-06-20 DIAGNOSIS — D649 Anemia, unspecified: Secondary | ICD-10-CM | POA: Diagnosis not present

## 2022-06-20 DIAGNOSIS — I951 Orthostatic hypotension: Secondary | ICD-10-CM | POA: Diagnosis not present

## 2022-06-20 DIAGNOSIS — J449 Chronic obstructive pulmonary disease, unspecified: Secondary | ICD-10-CM | POA: Diagnosis not present

## 2022-06-20 DIAGNOSIS — N189 Chronic kidney disease, unspecified: Secondary | ICD-10-CM | POA: Diagnosis not present

## 2022-06-20 DIAGNOSIS — Z794 Long term (current) use of insulin: Secondary | ICD-10-CM | POA: Diagnosis not present

## 2022-06-20 DIAGNOSIS — I129 Hypertensive chronic kidney disease with stage 1 through stage 4 chronic kidney disease, or unspecified chronic kidney disease: Secondary | ICD-10-CM | POA: Diagnosis not present

## 2022-06-21 DIAGNOSIS — N3020 Other chronic cystitis without hematuria: Secondary | ICD-10-CM | POA: Diagnosis not present

## 2022-06-26 DIAGNOSIS — M5126 Other intervertebral disc displacement, lumbar region: Secondary | ICD-10-CM | POA: Diagnosis not present

## 2022-07-02 ENCOUNTER — Other Ambulatory Visit: Payer: Self-pay | Admitting: Neurosurgery

## 2022-07-03 ENCOUNTER — Encounter: Payer: Self-pay | Admitting: Podiatry

## 2022-07-03 ENCOUNTER — Ambulatory Visit (INDEPENDENT_AMBULATORY_CARE_PROVIDER_SITE_OTHER): Payer: PPO | Admitting: Podiatry

## 2022-07-03 DIAGNOSIS — E1142 Type 2 diabetes mellitus with diabetic polyneuropathy: Secondary | ICD-10-CM

## 2022-07-03 DIAGNOSIS — M79676 Pain in unspecified toe(s): Secondary | ICD-10-CM

## 2022-07-03 DIAGNOSIS — D689 Coagulation defect, unspecified: Secondary | ICD-10-CM

## 2022-07-03 DIAGNOSIS — B351 Tinea unguium: Secondary | ICD-10-CM

## 2022-07-03 NOTE — Progress Notes (Addendum)
She presents today chief complaint of painful elongated toenails.  Objective: Pulses are palpable.  Toenails are long thick yellow dystrophic onychomycotic no open lesions or wounds.  Assessment: Pain limb secondary onychomycosis.  Plan: Debridement of toenails 1 through 5 bilateral.  She is having back surgery by Dr. Arnoldo Morale next Monday

## 2022-07-05 ENCOUNTER — Other Ambulatory Visit: Payer: Self-pay | Admitting: Neurosurgery

## 2022-07-05 ENCOUNTER — Encounter (HOSPITAL_COMMUNITY): Payer: Self-pay | Admitting: Neurosurgery

## 2022-07-05 NOTE — Progress Notes (Signed)
Anesthesia Chart Review:  Case: [id number] Date/Time: 07/08/22 1600   Procedure: L3-4 PLIF, Exploration of fusion - RM 21 to follow//3C   Anesthesia type: General   Pre-op diagnosis: Recurrent herniation of lumbar disc   Location: [facility] OR TO BE SCHEDULED ROOM 01 - ADD ON / [place] [facility]   Surgeons: Newman Pies, MD       DISCUSSION:  Patient is a 77 year old female scheduled for the above procedure. She is an add-on for 07/08/22.  History includes smoking, COPD, HTN (and hypotension), HLD, DM2, OSA (uses CPAP), CKD (IIIB), CVA (02/2015), PCOS, spinal surgery (L4-5 microdiscectomy 05/11/18; L4-S1 laminotomy/foraminotomy/PLIF, L4-S1 posterolateral arthrodesis PLIF 4//23, L34- microdiscectomy 10/01/21; Left L3-4 redo intervertebral discectomy 05/20/22), varicose veins (s/p right GSV ablation), hyponatremia (likely due to SIADH).    She had a prolonged hospitalization after 10/01/21 lumbar fusion. She was not discharged until 10/11/21. Hospital course complicated by AKI on CKD, ABL s/p 2 units PRBC. She was denied CIRC placement and declined SNF level rehab. She told PAT RN staff that she had hallucination and kidney failure post-operatively and believes she was "overdosed" with anesthesia. She was then readmitted 5/26/235/29/23 for acute pancreatitis felt due to medication like Cipro and/or Crestor. She does not use alcohol.  - Appears to have had an unremarkable post-operatively hospitalization for 05/20/22 left L3-4 redo discectomy. She was discharged home with HHPT on POD#1.   Last cardiology visit with Dr. Josefa Half was on 04/25/22. She was doing okay from a cardiac standpoint. HTN controlled. Crestor on hold since May pancreatitis. No new cardiac testing ordered. 4 month follow-up planned. She was previously given clearance to hold ASA and Plavix for 7 days prior to April back surgery.  Last Plavix and ASA doses documented as 07/03/22.   Last PCP visit with Dr. Kary Kos was on 04/09/22 and nephrology follow-up  with Dr. Holley Raring was on 12/25/21. Neurology classified her as moderate risk for her last surgery. Her Creatinine had ranged from ~ 1.8-2.2 since 09/2020-10/01/21. It peaked around 2.4 post-operatively on 10/03/21. Her Creatinine has been ~ 1.7-2.0 since September 2023.   Last visit with pulmonologist Dr. Raul Del was on 12/20/21. COPD documented as "mild COPD, stage II". She was not yet ready to stop smoking. Continue CPAP at current settings.    She is a same day work-up, so further anesthesia team evaluation on the day of surgery.     VS: Ht '5\' 7"'$  (1.702 m)   Wt 76.2 kg   BMI 26.31 kg/m  BP Readings from Last 3 Encounters:  05/21/22 (!) 163/53  01/22/22 (!) 165/69  01/15/22 (!) 207/65   Pulse Readings from Last 3 Encounters:  05/21/22 64  01/22/22 79  01/15/22 70     PROVIDERS: Maryland Pink, MD is PCP  Isaias Cowman, MD is cardiologist  Anthonette Legato, MD is nephrologist Wallene Huh, MD is pulmonologist Olean Ree, MD is GI Jennings Books, MD is neurologist    LABS: For day of surgery as indicated. She had labs on 04/23/22 through [facility] (see Care Everywhere). Results included: Leukos 200, BUN 29, creatinine 1.90, sodium 131, potassium 5.0, phosphorus 4.3, calcium 9.3, albumin 3.6, WBC 10.7, hemoglobin 11.1, hematocrit 32.8, platelet count 328.  CMP on 04/09/2022 ([facility]) showed Creatinine 2.0,  AST 13, ALT 5, alkaline phosphatase 113, total bilirubin 0.4.  A1c 6.1% 01/21/2022 ([facility]).    IMAGES: MRI L-spine 06/26/22 ([facility]): IMPRESSION: 1. Residual recurrent disc material on the left at L3-4 measuring 14 x 14 x 5 mm, extending  cephalad from the disc space. 2. More superiorly is a heterogeneous collection with some peripheral enhancement. This may represent hemorrhage or disc fragments or some combination of the both. This does not appear to be infection as there is no other enhancement extending beyond the area. It does create some mass effect on  the traversing nerve roots. 3. No definite disc or other material into the left L3 foramen. 4. The remainder of the lumbar spine is stable.   CT Chest 01/14/22: IMPRESSION: 1. Multiple scattered bilateral pulmonary nodules, overall not significantly changed in size from prior exam. No pulmonary mass. Please note examination was not performed as a lung cancer screening exam. Recommend return to annual low-dose screening CT. 2. Previous patchy ground-glass attenuation throughout both lungs on prior exam has improved and essentially resolved in the interim. 3. Patulous upper esophagus with wall thickening in the midportion, query esophagitis. Recommend correlation with prior EGD results. 4. Aortic atherosclerosis.  Coronary artery calcifications. 5. Emphysema and bronchial wall thickening, imaging findings suggestive of COPD.     EKG: 11/09/21:  Sinus rhythm with Premature atrial complexes Rightward axis Cannot rule out Anterior infarct , age undetermined Abnormal ECG When compared with ECG of 06-May-2018 14:50, Premature atrial complexes are now Present T wave amplitude has increased in Lateral leads     CV: Echo 08/06/18 ([facility]) INTERPRETATION  NORMAL LEFT VENTRICULAR SYSTOLIC FUNCTION   WITH MILD LVH  NORMAL RIGHT VENTRICULAR SYSTOLIC FUNCTION  TRIVIAL REGURGITATION NOTED (See above)  NO VALVULAR STENOSIS  TRIVIAL MR, TR  EF 60%      Per [facility]: - 72-hour Holter monitor (03/19/2021 - 03/22/2021) revealed predominant sinus rhythm with mean heart rate of 83 bpm, heart rate range 62 to 176 bpm, episodes of sinus tachycardia as well as occasional premature atrial contractions.     - CAD (coronary artery disease) 20% LAD, 40% RCA stenosis. Other vessels normal.  mild, 2001 noted to have 20% LAD, 40% RCA stenosis, other vessels normal      US Carotid 04/26/16: Impression: Doppler velocities suggest 1 to 39% bilateral proximal internal carotid artery  stenoses.   ETT 12/06/11: Recommendations:  Negative adequate ETT.  No further testing is indicated.     Past Medical History:  Diagnosis Date   Anemia    Arthritis    Bladder incontinence    Broken foot    Cataracts, bilateral    Chronic kidney insufficiency    stage 3b   COPD (chronic obstructive pulmonary disease) (HCC)    wheezing   Coronary artery disease 04/25/2022   in [facility]   COVID 2021   very mild case   CVA (cerebral vascular accident) ([place]) 2016   has had 3 strokes, states right side is slightlyweaker than left   Diabetes mellitus    insulin dependent, Type 2   GERD (gastroesophageal reflux disease)    HLD (hyperlipidemia)    Hx of cardiovascular stress test    a. ETT (6/13):  Ex 5:13; no ischemic changes   Hypertension    controlled on meds   Lacunar stroke of left subthalamic region [facility]) 02/2015   Leg pain    left   Lower back pain    Neuromuscular disorder (HCC)    stroke right hand tingling   Orthostatic hypotension    Osteopenia 01/2017   T score -2.0 stable from prior DEXA   Pancreatitis 10/2021   PCOS (polycystic ovarian syndrome)    Personal history of tobacco use, presenting hazards  to health 01/09/2015   PONV (postoperative nausea and vomiting)    Sleep apnea    uses CPAP nightly    Past Surgical History:  Procedure Laterality Date   BOTOX INJECTION  01/15/2022   Procedure: BOTOX INJECTION;  Surgeon: Clarene Essex, MD;  Location: [facility];  Service: Gastroenterology;;   Delphina Cahill LIFT Bilateral 11/04/2017   Procedure: BLEPHAROPLASTY UPPER EYELID W/EXCESS SKIN;  Surgeon: Karle Starch, MD;  Location: [facility];  Service: Ophthalmology;  Laterality: Bilateral;  DIABETES-insulin dependent uses CPAP   CARDIAC CATHETERIZATION  20 yrs ago   found nothing   CARPAL TUNNEL RELEASE Bilateral    CATARACT EXTRACTION Bilateral    ELBOW SURGERY Bilateral    ESOPHAGOGASTRODUODENOSCOPY N/A 07/25/2021   Procedure: ESOPHAGOGASTRODUODENOSCOPY (EGD);   Surgeon: Toledo, Benay Pike, MD;  Location: [facility];  Service: Gastroenterology;  Laterality: N/A;  IDDM   ESOPHAGOGASTRODUODENOSCOPY N/A 01/15/2022   Procedure: ESOPHAGOGASTRODUODENOSCOPY (EGD);  Surgeon: Clarene Essex, MD;  Location: [facility];  Service: Gastroenterology;  Laterality: N/A;  botox   FOOT SURGERY     Groin Abscess     HAND SURGERY     KNEE SURGERY Bilateral    LABIAL ABSCESS     LUMBAR LAMINECTOMY/DECOMPRESSION MICRODISCECTOMY Left 05/11/2018   Procedure: LUMBAR LAMINECTOMY/DECOMPRESSION MICRODISCECTOMY 1 LEVEL- L4-5;  Surgeon: [name], MD;  Location: [facility];  Service: Neurosurgery;  Laterality: Left;   LUMBAR LAMINECTOMY/DECOMPRESSION MICRODISCECTOMY Left 05/20/2022   Procedure: MICRODISCECTOMY L3-4;  Surgeon: Newman Pies, MD;  Location: [place];  Service: Neurosurgery;  Laterality: Left;  3C   OOPHORECTOMY     BSO   PUBO VAG SLING     SHOULDER SURGERY     bilateral arthroscopies   VAGINAL HYSTERECTOMY  1979    MEDICATIONS: No current facility-administered medications for this encounter.    acetaminophen (TYLENOL) 500 MG tablet   aspirin EC 81 MG tablet   calcitRIOL (ROCALTROL) 0.25 MCG capsule   clopidogrel (PLAVIX) 75 MG tablet   D-MANNOSE PO   diazepam (VALIUM) 10 MG tablet   diphenhydrAMINE (BENADRYL) 25 MG tablet   docusate sodium (COLACE) 100 MG capsule   estradiol (ESTRACE) 0.1 MG/GM vaginal cream   feeding supplement (BOOST HIGH PROTEIN) LIQD   insulin detemir (LEVEMIR) 100 UNIT/ML injection   losartan (COZAAR) 100 MG tablet   metoprolol succinate (TOPROL-XL) 100 MG 24 hr tablet   Multiple Vitamins-Minerals (PRESERVISION AREDS 2+MULTI VIT PO)   nitrofurantoin, macrocrystal-monohydrate, (MACROBID) 100 MG capsule   ondansetron (ZOFRAN) 4 MG tablet   oxyCODONE-acetaminophen (PERCOCET/ROXICET) 5-325 MG tablet   pantoprazole (PROTONIX) 40 MG tablet   promethazine (PHENERGAN) 25 MG tablet   sennosides-docusate sodium (SENOKOT-S) 8.6-50 MG  tablet   sucralfate (CARAFATE) 1 g tablet   Vibegron (GEMTESA) 75 MG TABS   zolpidem (AMBIEN) 10 MG tablet   BD INSULIN SYRINGE U/F 31G X 5/16" 1 ML MISC   ONETOUCH ULTRA test strip    Myra Gianotti, PA-C Surgical Short Stay/Anesthesiology [facility] Phone [phone] [facility] Phone [phone] 07/05/2022 2:21 PM

## 2022-07-05 NOTE — Anesthesia Preprocedure Evaluation (Addendum)
Anesthesia Evaluation  Patient identified by MRN, date of birth, ID band Patient awake    Reviewed: Allergy & Precautions, H&P , NPO status , Patient's Chart, lab work & pertinent test results, reviewed documented beta blocker date and time   History of Anesthesia Complications (+) PONV and history of anesthetic complications  Airway Mallampati: II  TM Distance: >3 FB Neck ROM: Full    Dental no notable dental hx. (+) Teeth Intact, Dental Advisory Given   Pulmonary sleep apnea and Continuous Positive Airway Pressure Ventilation , COPD, Current Smoker and Patient abstained from smoking.   Pulmonary exam normal breath sounds clear to auscultation       Cardiovascular hypertension, Pt. on medications and Pt. on home beta blockers  Rhythm:Regular Rate:Normal     Neuro/Psych   Anxiety     CVA, Residual Symptoms    GI/Hepatic Neg liver ROS,GERD  Medicated,,  Endo/Other  diabetes, Insulin Dependent    Renal/GU Renal disease  negative genitourinary   Musculoskeletal  (+) Arthritis , Osteoarthritis,    Abdominal   Peds  Hematology  (+) Blood dyscrasia, anemia   Anesthesia Other Findings   Reproductive/Obstetrics negative OB ROS                             Anesthesia Physical Anesthesia Plan  ASA: 3  Anesthesia Plan: General   Post-op Pain Management: Tylenol PO (pre-op)*   Induction: Intravenous  PONV Risk Score and Plan: 4 or greater and Ondansetron, Dexamethasone and Propofol infusion  Airway Management Planned: Oral ETT  Additional Equipment:   Intra-op Plan:   Post-operative Plan: Extubation in OR  Informed Consent: I have reviewed the patients History and Physical, chart, labs and discussed the procedure including the risks, benefits and alternatives for the proposed anesthesia with the patient or authorized representative who has indicated his/her understanding and acceptance.      Dental advisory given  Plan Discussed with: CRNA  Anesthesia Plan Comments: (PAT note written 07/05/2022 by Myra Gianotti, PA-C.  )       Anesthesia Quick Evaluation

## 2022-07-05 NOTE — Progress Notes (Signed)
Sent the following IB message to Dr Annye Asa regarding this patient and her anesthesia care.  (Good Morning Dr Glennon Mac,  Patient Mckenzie Lawrence (MRN [id number]) is having surgery on Monday, 07/08/22 at 1615 start time and she is requesting you for her anesthesia care.)

## 2022-07-05 NOTE — Progress Notes (Addendum)
Patient is requesting Dr Annye Asa for her anesthesia care.  PCP/Internal Med - Dr Maryland Pink Cardiologist - Dr Isaias Cowman Neurology - Dr Jennings Books Nephrology - Dr Anthonette Legato  CT Chest x-ray - 01/14/22 EKG - 11/09/21 Stress Test - 12/06/11 ECHO - 03/16/15 Cardiac Cath - greater than 20 yrs ago  ICD Pacemaker/Loop - n/a  Sleep Study -  Yes CPAP - uses CPAP nightly   Diabetes Type 2     THE MORNING OF SURGERY, take  1/2 your insulin dose on DOS.   If your blood sugar is less than 70 mg/dL, you will need to treat for low blood sugar: Treat a low blood sugar (less than 70 mg/dL) with  cup of clear juice (cranberry or apple),  Recheck blood sugar in 15 minutes after treatment (to make sure it is greater than 70 mg/dL). If your blood sugar is not greater than 70 mg/dL on recheck, call [phone] for further instructions.  Blood Thinner Instructions:  Follow your surgeon's instructions on when to stop Plavix prior to surgery.  Last dose was on 07/03/22  Aspirin Instructions: Follow your surgeon's instructions on when to stop aspirin prior to surgery.  Last dose was on 07/03/22  ERAS: Clear liquids til 1:15 PM DOS.  Anesthesia review: Yes  STOP now taking any Aspirin (unless otherwise instructed by your surgeon), Aleve, Naproxen, Ibuprofen, Motrin, Advil, Goody's, BC's, all herbal medications, fish oil, and all vitamins.   Coronavirus Screening Do you have any of the following symptoms:  Cough yes/no: No Fever (>100.64F)  yes/no: No Runny nose yes/no: No Sore throat yes/no: No Difficulty breathing/shortness of breath  yes/no: No  Have you traveled in the last 14 days and where? yes/no: No  Patient verbalized understanding of instructions that were given via phone.

## 2022-07-08 ENCOUNTER — Other Ambulatory Visit: Payer: Self-pay

## 2022-07-08 ENCOUNTER — Inpatient Hospital Stay (HOSPITAL_COMMUNITY): Payer: PPO | Admitting: Physician Assistant

## 2022-07-08 ENCOUNTER — Encounter (HOSPITAL_COMMUNITY): Payer: Self-pay | Admitting: Neurosurgery

## 2022-07-08 ENCOUNTER — Inpatient Hospital Stay (HOSPITAL_COMMUNITY): Payer: PPO

## 2022-07-08 ENCOUNTER — Inpatient Hospital Stay (HOSPITAL_COMMUNITY)
Admission: RE | Disposition: A | Discharge status: Home Health | Payer: Self-pay | Source: Home / Self Care | Attending: Neurosurgery

## 2022-07-08 ENCOUNTER — Inpatient Hospital Stay (HOSPITAL_COMMUNITY)
Admission: RE | Admit: 2022-07-08 | Discharge: 2022-07-11 | DRG: 454 | Disposition: A | Discharge status: Home Health | Payer: PPO | Attending: Neurosurgery | Admitting: Neurosurgery

## 2022-07-08 DIAGNOSIS — M8580 Other specified disorders of bone density and structure, unspecified site: Secondary | ICD-10-CM | POA: Diagnosis not present

## 2022-07-08 DIAGNOSIS — F17210 Nicotine dependence, cigarettes, uncomplicated: Secondary | ICD-10-CM | POA: Diagnosis present

## 2022-07-08 DIAGNOSIS — E871 Hypo-osmolality and hyponatremia: Secondary | ICD-10-CM | POA: Diagnosis not present

## 2022-07-08 DIAGNOSIS — Z885 Allergy status to narcotic agent status: Secondary | ICD-10-CM

## 2022-07-08 DIAGNOSIS — F419 Anxiety disorder, unspecified: Secondary | ICD-10-CM

## 2022-07-08 DIAGNOSIS — Z8616 Personal history of COVID-19: Secondary | ICD-10-CM | POA: Diagnosis not present

## 2022-07-08 DIAGNOSIS — Z888 Allergy status to other drugs, medicaments and biological substances status: Secondary | ICD-10-CM | POA: Diagnosis not present

## 2022-07-08 DIAGNOSIS — Z8249 Family history of ischemic heart disease and other diseases of the circulatory system: Secondary | ICD-10-CM

## 2022-07-08 DIAGNOSIS — M960 Pseudarthrosis after fusion or arthrodesis: Secondary | ICD-10-CM | POA: Diagnosis not present

## 2022-07-08 DIAGNOSIS — I491 Atrial premature depolarization: Secondary | ICD-10-CM | POA: Diagnosis not present

## 2022-07-08 DIAGNOSIS — F17200 Nicotine dependence, unspecified, uncomplicated: Secondary | ICD-10-CM | POA: Diagnosis not present

## 2022-07-08 DIAGNOSIS — I129 Hypertensive chronic kidney disease with stage 1 through stage 4 chronic kidney disease, or unspecified chronic kidney disease: Secondary | ICD-10-CM | POA: Diagnosis not present

## 2022-07-08 DIAGNOSIS — E1122 Type 2 diabetes mellitus with diabetic chronic kidney disease: Secondary | ICD-10-CM | POA: Diagnosis not present

## 2022-07-08 DIAGNOSIS — M5116 Intervertebral disc disorders with radiculopathy, lumbar region: Secondary | ICD-10-CM | POA: Diagnosis present

## 2022-07-08 DIAGNOSIS — Z7902 Long term (current) use of antithrombotics/antiplatelets: Secondary | ICD-10-CM

## 2022-07-08 DIAGNOSIS — J449 Chronic obstructive pulmonary disease, unspecified: Secondary | ICD-10-CM | POA: Diagnosis not present

## 2022-07-08 DIAGNOSIS — Z9109 Other allergy status, other than to drugs and biological substances: Secondary | ICD-10-CM

## 2022-07-08 DIAGNOSIS — Z833 Family history of diabetes mellitus: Secondary | ICD-10-CM

## 2022-07-08 DIAGNOSIS — Z981 Arthrodesis status: Principal | ICD-10-CM

## 2022-07-08 DIAGNOSIS — Z808 Family history of malignant neoplasm of other organs or systems: Secondary | ICD-10-CM

## 2022-07-08 DIAGNOSIS — I2510 Atherosclerotic heart disease of native coronary artery without angina pectoris: Secondary | ICD-10-CM | POA: Diagnosis present

## 2022-07-08 DIAGNOSIS — K219 Gastro-esophageal reflux disease without esophagitis: Secondary | ICD-10-CM | POA: Diagnosis present

## 2022-07-08 DIAGNOSIS — Z882 Allergy status to sulfonamides status: Secondary | ICD-10-CM

## 2022-07-08 DIAGNOSIS — Z7982 Long term (current) use of aspirin: Secondary | ICD-10-CM

## 2022-07-08 DIAGNOSIS — I10 Essential (primary) hypertension: Secondary | ICD-10-CM

## 2022-07-08 DIAGNOSIS — E785 Hyperlipidemia, unspecified: Secondary | ICD-10-CM | POA: Diagnosis not present

## 2022-07-08 DIAGNOSIS — Z794 Long term (current) use of insulin: Secondary | ICD-10-CM

## 2022-07-08 DIAGNOSIS — M48062 Spinal stenosis, lumbar region with neurogenic claudication: Principal | ICD-10-CM | POA: Diagnosis present

## 2022-07-08 DIAGNOSIS — Z79899 Other long term (current) drug therapy: Secondary | ICD-10-CM

## 2022-07-08 DIAGNOSIS — N1832 Chronic kidney disease, stage 3b: Secondary | ICD-10-CM | POA: Diagnosis present

## 2022-07-08 DIAGNOSIS — Z8673 Personal history of transient ischemic attack (TIA), and cerebral infarction without residual deficits: Secondary | ICD-10-CM

## 2022-07-08 DIAGNOSIS — M5126 Other intervertebral disc displacement, lumbar region: Secondary | ICD-10-CM | POA: Diagnosis present

## 2022-07-08 DIAGNOSIS — M4726 Other spondylosis with radiculopathy, lumbar region: Secondary | ICD-10-CM | POA: Diagnosis present

## 2022-07-08 DIAGNOSIS — G4733 Obstructive sleep apnea (adult) (pediatric): Secondary | ICD-10-CM | POA: Diagnosis present

## 2022-07-08 DIAGNOSIS — M1990 Unspecified osteoarthritis, unspecified site: Secondary | ICD-10-CM | POA: Diagnosis present

## 2022-07-08 LAB — BASIC METABOLIC PANEL
Anion gap: 11 (ref 5–15)
BUN: 29 mg/dL — ABNORMAL HIGH (ref 8–23)
CO2: 21 mmol/L — ABNORMAL LOW (ref 22–32)
Calcium: 9.1 mg/dL (ref 8.9–10.3)
Chloride: 96 mmol/L — ABNORMAL LOW (ref 98–111)
Creatinine, Ser: 1.91 mg/dL — ABNORMAL HIGH (ref 0.44–1.00)
GFR, Estimated: 27 mL/min — ABNORMAL LOW (ref >60)
Glucose, Bld: 139 mg/dL — ABNORMAL HIGH (ref 70–99)
Potassium: 4.1 mmol/L (ref 3.5–5.1)
Sodium: 128 mmol/L — ABNORMAL LOW (ref 135–145)

## 2022-07-08 LAB — GLUCOSE, CAPILLARY
Glucose-Capillary: 125 mg/dL — ABNORMAL HIGH (ref 70–99)
Glucose-Capillary: 140 mg/dL — ABNORMAL HIGH (ref 70–99)
Glucose-Capillary: 231 mg/dL — ABNORMAL HIGH (ref 70–99)
Glucose-Capillary: 262 mg/dL — ABNORMAL HIGH (ref 70–99)

## 2022-07-08 LAB — CBC
HCT: 34.4 % — ABNORMAL LOW (ref 36.0–46.0)
Hemoglobin: 11 g/dL — ABNORMAL LOW (ref 12.0–15.0)
MCH: 30.6 pg (ref 26.0–34.0)
MCHC: 32 g/dL (ref 30.0–36.0)
MCV: 95.8 fL (ref 80.0–100.0)
Platelets: 369 10*3/uL (ref 150–400)
RBC: 3.59 MIL/uL — ABNORMAL LOW (ref 3.87–5.11)
RDW: 13.6 % (ref 11.5–15.5)
WBC: 11.4 10*3/uL — ABNORMAL HIGH (ref 4.0–10.5)
nRBC: 0 % (ref 0.0–0.2)

## 2022-07-08 LAB — SURGICAL PCR SCREEN
MRSA, PCR: NEGATIVE
Staphylococcus aureus: NEGATIVE

## 2022-07-08 LAB — TYPE AND SCREEN
ABO/RH(D): A POS
Antibody Screen: NEGATIVE

## 2022-07-08 SURGERY — POSTERIOR LUMBAR FUSION 1 LEVEL
Anesthesia: General | Site: Back

## 2022-07-08 MED ORDER — KETAMINE HCL 50 MG/5ML IJ SOSY
PREFILLED_SYRINGE | INTRAMUSCULAR | Status: AC
  Filled 2022-07-08: qty 5

## 2022-07-08 MED ORDER — CHLORHEXIDINE GLUCONATE 0.12 % MT SOLN: 15.0000 mL | Freq: Once | OROMUCOSAL | Status: DC

## 2022-07-08 MED ORDER — PROPOFOL 500 MG/50ML IV EMUL
INTRAVENOUS | Status: DC | PRN
  Administered 2022-07-08: 25 ug/kg/min via INTRAVENOUS

## 2022-07-08 MED ORDER — ACETAMINOPHEN 325 MG PO TABS: 650.0000 mg | ORAL_TABLET | ORAL | Status: DC | PRN

## 2022-07-08 MED ORDER — THROMBIN 5000 UNITS EX SOLR
CUTANEOUS | Status: AC
  Filled 2022-07-08: qty 5000

## 2022-07-08 MED ORDER — LIDOCAINE 2% (20 MG/ML) 5 ML SYRINGE
INTRAMUSCULAR | Status: DC | PRN
  Administered 2022-07-08: 60 mg via INTRAVENOUS

## 2022-07-08 MED ORDER — BISACODYL 10 MG RE SUPP: 10.0000 mg | Freq: Every day | RECTAL | Status: DC | PRN

## 2022-07-08 MED ORDER — DIAZEPAM 5 MG PO TABS: 10.0000 mg | ORAL_TABLET | Freq: Four times a day (QID) | ORAL | Status: DC | PRN

## 2022-07-08 MED ORDER — ZOLPIDEM TARTRATE 5 MG PO TABS: 10.0000 mg | ORAL_TABLET | Freq: Every evening | ORAL | Status: DC | PRN

## 2022-07-08 MED ORDER — OXYCODONE HCL 5 MG PO TABS
10.0000 mg | ORAL_TABLET | ORAL | Status: DC | PRN
  Administered 2022-07-10 – 2022-07-11 (×5): 10 mg via ORAL
  Filled 2022-07-08 (×6): qty 2

## 2022-07-08 MED ORDER — FENTANYL CITRATE (PF) 250 MCG/5ML IJ SOLN
INTRAMUSCULAR | Status: DC | PRN
  Administered 2022-07-08: 50 ug via INTRAVENOUS
  Administered 2022-07-08: 100 ug via INTRAVENOUS

## 2022-07-08 MED ORDER — FENTANYL CITRATE (PF) 250 MCG/5ML IJ SOLN
INTRAMUSCULAR | Status: AC
  Filled 2022-07-08: qty 5

## 2022-07-08 MED ORDER — METOPROLOL SUCCINATE ER 50 MG PO TB24
100.0000 mg | ORAL_TABLET | Freq: Every day | ORAL | Status: DC
  Administered 2022-07-08 – 2022-07-09 (×2): 100 mg via ORAL
  Filled 2022-07-08 (×2): qty 2

## 2022-07-08 MED ORDER — OXYCODONE HCL 5 MG PO TABS
5.0000 mg | ORAL_TABLET | ORAL | Status: DC | PRN
  Administered 2022-07-08 – 2022-07-11 (×4): 5 mg via ORAL
  Filled 2022-07-08 (×3): qty 1

## 2022-07-08 MED ORDER — SODIUM CHLORIDE 0.9 % IV SOLN
250.0000 mL | INTRAVENOUS | Status: DC
  Administered 2022-07-08: 250 mL via INTRAVENOUS

## 2022-07-08 MED ORDER — BACITRACIN ZINC 500 UNIT/GM EX OINT
TOPICAL_OINTMENT | CUTANEOUS | Status: DC | PRN
  Administered 2022-07-08: 1 via TOPICAL

## 2022-07-08 MED ORDER — PHENOL 1.4 % MT LIQD: 1.0000 | OROMUCOSAL | Status: DC | PRN

## 2022-07-08 MED ORDER — LIDOCAINE 2% (20 MG/ML) 5 ML SYRINGE
INTRAMUSCULAR | Status: AC
  Filled 2022-07-08: qty 5

## 2022-07-08 MED ORDER — MIRABEGRON ER 25 MG PO TB24
25.0000 mg | ORAL_TABLET | Freq: Every day | ORAL | Status: DC
  Administered 2022-07-09: 25 mg via ORAL
  Filled 2022-07-08 (×2): qty 1

## 2022-07-08 MED ORDER — PHENYLEPHRINE HCL-NACL 20-0.9 MG/250ML-% IV SOLN
INTRAVENOUS | Status: DC | PRN
  Administered 2022-07-08: 50 ug/min via INTRAVENOUS

## 2022-07-08 MED ORDER — SUCRALFATE 1 G PO TABS: 1.0000 g | ORAL_TABLET | Freq: Two times a day (BID) | ORAL | Status: DC | PRN

## 2022-07-08 MED ORDER — LOSARTAN POTASSIUM 50 MG PO TABS
100.0000 mg | ORAL_TABLET | Freq: Every day | ORAL | Status: DC
  Administered 2022-07-09: 100 mg via ORAL
  Filled 2022-07-08 (×3): qty 2

## 2022-07-08 MED ORDER — CHLORHEXIDINE GLUCONATE CLOTH 2 % EX PADS: 6.0000 | MEDICATED_PAD | Freq: Once | CUTANEOUS | Status: DC

## 2022-07-08 MED ORDER — BUPIVACAINE LIPOSOME 1.3 % IJ SUSP
INTRAMUSCULAR | Status: DC | PRN
  Administered 2022-07-08: 20 mL

## 2022-07-08 MED ORDER — FENTANYL CITRATE (PF) 100 MCG/2ML IJ SOLN
25.0000 ug | INTRAMUSCULAR | Status: DC | PRN
  Administered 2022-07-08 (×2): 50 ug via INTRAVENOUS

## 2022-07-08 MED ORDER — SUGAMMADEX SODIUM 200 MG/2ML IV SOLN
INTRAVENOUS | Status: DC | PRN
  Administered 2022-07-08 (×2): 100 mg via INTRAVENOUS

## 2022-07-08 MED ORDER — DEXAMETHASONE SODIUM PHOSPHATE 10 MG/ML IJ SOLN
INTRAMUSCULAR | Status: DC | PRN
  Administered 2022-07-08: 5 mg via INTRAVENOUS

## 2022-07-08 MED ORDER — INSULIN ASPART 100 UNIT/ML IJ SOLN
0.0000 [IU] | INTRAMUSCULAR | Status: DC
  Administered 2022-07-08: 11 [IU] via SUBCUTANEOUS
  Administered 2022-07-09 (×3): 3 [IU] via SUBCUTANEOUS
  Administered 2022-07-09: 4 [IU] via SUBCUTANEOUS
  Administered 2022-07-10: 3 [IU] via SUBCUTANEOUS
  Administered 2022-07-10: 20 [IU] via SUBCUTANEOUS
  Administered 2022-07-10 (×2): 7 [IU] via SUBCUTANEOUS
  Administered 2022-07-11: 4 [IU] via SUBCUTANEOUS
  Administered 2022-07-11 (×2): 3 [IU] via SUBCUTANEOUS

## 2022-07-08 MED ORDER — MENTHOL 3 MG MT LOZG: 1.0000 | LOZENGE | OROMUCOSAL | Status: DC | PRN

## 2022-07-08 MED ORDER — 0.9 % SODIUM CHLORIDE (POUR BTL) OPTIME
TOPICAL | Status: DC | PRN
  Administered 2022-07-08: 1000 mL

## 2022-07-08 MED ORDER — PANTOPRAZOLE SODIUM 40 MG PO TBEC
40.0000 mg | DELAYED_RELEASE_TABLET | Freq: Two times a day (BID) | ORAL | Status: DC | PRN
  Administered 2022-07-09 – 2022-07-11 (×3): 40 mg via ORAL
  Filled 2022-07-08 (×3): qty 1

## 2022-07-08 MED ORDER — SODIUM CHLORIDE 0.9% FLUSH
3.0000 mL | Freq: Two times a day (BID) | INTRAVENOUS | Status: DC
  Administered 2022-07-08 – 2022-07-11 (×4): 3 mL via INTRAVENOUS

## 2022-07-08 MED ORDER — CHLORHEXIDINE GLUCONATE 0.12 % MT SOLN
OROMUCOSAL | Status: AC
  Administered 2022-07-08: 15 mL via OROMUCOSAL
  Filled 2022-07-08: qty 15

## 2022-07-08 MED ORDER — BUPIVACAINE LIPOSOME 1.3 % IJ SUSP
INTRAMUSCULAR | Status: AC
  Filled 2022-07-08: qty 20

## 2022-07-08 MED ORDER — ACETAMINOPHEN 500 MG PO TABS
1000.0000 mg | ORAL_TABLET | Freq: Four times a day (QID) | ORAL | Status: AC
  Administered 2022-07-08 – 2022-07-09 (×4): 1000 mg via ORAL
  Filled 2022-07-08 (×4): qty 2

## 2022-07-08 MED ORDER — SODIUM CHLORIDE 0.9% FLUSH
3.0000 mL | INTRAVENOUS | Status: DC | PRN
  Administered 2022-07-10: 3 mL via INTRAVENOUS

## 2022-07-08 MED ORDER — BACITRACIN ZINC 500 UNIT/GM EX OINT
TOPICAL_OINTMENT | CUTANEOUS | Status: AC
  Filled 2022-07-08: qty 28.35

## 2022-07-08 MED ORDER — ONDANSETRON HCL 4 MG PO TABS
4.0000 mg | ORAL_TABLET | Freq: Four times a day (QID) | ORAL | Status: DC | PRN
  Administered 2022-07-09 – 2022-07-10 (×3): 4 mg via ORAL
  Filled 2022-07-08 (×4): qty 1

## 2022-07-08 MED ORDER — CEFAZOLIN SODIUM-DEXTROSE 2-4 GM/100ML-% IV SOLN
2.0000 g | Freq: Three times a day (TID) | INTRAVENOUS | Status: AC
  Administered 2022-07-08 – 2022-07-09 (×2): 2 g via INTRAVENOUS
  Filled 2022-07-08 (×2): qty 100

## 2022-07-08 MED ORDER — ORAL CARE MOUTH RINSE: 15.0000 mL | Freq: Once | OROMUCOSAL | Status: DC

## 2022-07-08 MED ORDER — KETAMINE HCL 10 MG/ML IJ SOLN
INTRAMUSCULAR | Status: DC | PRN
  Administered 2022-07-08: 10 mg via INTRAVENOUS
  Administered 2022-07-08: 25 mg via INTRAVENOUS
  Administered 2022-07-08: 10 mg via INTRAVENOUS
  Administered 2022-07-08: 5 mg via INTRAVENOUS

## 2022-07-08 MED ORDER — DEXAMETHASONE SODIUM PHOSPHATE 10 MG/ML IJ SOLN
INTRAMUSCULAR | Status: AC
  Filled 2022-07-08: qty 3

## 2022-07-08 MED ORDER — OXYCODONE-ACETAMINOPHEN 5-325 MG PO TABS
1.0000 | ORAL_TABLET | ORAL | Status: DC | PRN
  Filled 2022-07-08: qty 1

## 2022-07-08 MED ORDER — ROCURONIUM BROMIDE 10 MG/ML (PF) SYRINGE
PREFILLED_SYRINGE | INTRAVENOUS | Status: AC
  Filled 2022-07-08: qty 10

## 2022-07-08 MED ORDER — PROMETHAZINE HCL 25 MG PO TABS
25.0000 mg | ORAL_TABLET | Freq: Four times a day (QID) | ORAL | Status: DC | PRN
  Filled 2022-07-08: qty 1

## 2022-07-08 MED ORDER — ONDANSETRON HCL 4 MG/2ML IJ SOLN
INTRAMUSCULAR | Status: DC | PRN
  Administered 2022-07-08: 4 mg via INTRAVENOUS

## 2022-07-08 MED ORDER — ONDANSETRON HCL 4 MG/2ML IJ SOLN
4.0000 mg | Freq: Four times a day (QID) | INTRAMUSCULAR | Status: DC | PRN
  Administered 2022-07-09 – 2022-07-11 (×5): 4 mg via INTRAVENOUS
  Filled 2022-07-08 (×7): qty 2

## 2022-07-08 MED ORDER — DOCUSATE SODIUM 100 MG PO CAPS
100.0000 mg | ORAL_CAPSULE | Freq: Two times a day (BID) | ORAL | Status: DC
  Administered 2022-07-08 – 2022-07-09 (×3): 100 mg via ORAL
  Filled 2022-07-08 (×4): qty 1

## 2022-07-08 MED ORDER — ACETAMINOPHEN 500 MG PO TABS: 1000.0000 mg | ORAL_TABLET | Freq: Once | ORAL | Status: DC

## 2022-07-08 MED ORDER — ACETAMINOPHEN 10 MG/ML IV SOLN
INTRAVENOUS | Status: DC | PRN
  Administered 2022-07-08: 1000 mg via INTRAVENOUS

## 2022-07-08 MED ORDER — ONDANSETRON HCL 4 MG/2ML IJ SOLN
INTRAMUSCULAR | Status: AC
  Filled 2022-07-08: qty 4

## 2022-07-08 MED ORDER — ORAL CARE MOUTH RINSE: 15.0000 mL | Freq: Once | OROMUCOSAL | Status: AC

## 2022-07-08 MED ORDER — THROMBIN 5000 UNITS EX SOLR
OROMUCOSAL | Status: DC | PRN
  Administered 2022-07-08: 5 mL via TOPICAL

## 2022-07-08 MED ORDER — ROCURONIUM BROMIDE 10 MG/ML (PF) SYRINGE
PREFILLED_SYRINGE | INTRAVENOUS | Status: DC | PRN
  Administered 2022-07-08 (×2): 20 mg via INTRAVENOUS
  Administered 2022-07-08: 60 mg via INTRAVENOUS
  Administered 2022-07-08: 20 mg via INTRAVENOUS

## 2022-07-08 MED ORDER — PROPOFOL 10 MG/ML IV BOLUS
INTRAVENOUS | Status: AC
  Filled 2022-07-08: qty 20

## 2022-07-08 MED ORDER — INSULIN ASPART 100 UNIT/ML IJ SOLN: 0.0000 [IU] | INTRAMUSCULAR | Status: DC | PRN

## 2022-07-08 MED ORDER — LACTATED RINGERS IV SOLN: INTRAVENOUS | Status: DC

## 2022-07-08 MED ORDER — NITROFURANTOIN MONOHYD MACRO 100 MG PO CAPS
100.0000 mg | ORAL_CAPSULE | Freq: Every day | ORAL | Status: DC
  Administered 2022-07-09 (×2): 100 mg via ORAL
  Filled 2022-07-08 (×3): qty 1

## 2022-07-08 MED ORDER — ONDANSETRON HCL 4 MG PO TABS
4.0000 mg | ORAL_TABLET | Freq: Three times a day (TID) | ORAL | Status: DC | PRN
  Administered 2022-07-09: 4 mg via ORAL
  Filled 2022-07-08 (×2): qty 1

## 2022-07-08 MED ORDER — ACETAMINOPHEN 650 MG RE SUPP: 650.0000 mg | RECTAL | Status: DC | PRN

## 2022-07-08 MED ORDER — FENTANYL CITRATE (PF) 100 MCG/2ML IJ SOLN
INTRAMUSCULAR | Status: AC
  Filled 2022-07-08: qty 2

## 2022-07-08 MED ORDER — PROPOFOL 10 MG/ML IV BOLUS
INTRAVENOUS | Status: DC | PRN
  Administered 2022-07-08: 50 mg via INTRAVENOUS
  Administered 2022-07-08: 140 mg via INTRAVENOUS

## 2022-07-08 MED ORDER — CEFAZOLIN SODIUM-DEXTROSE 2-4 GM/100ML-% IV SOLN
2.0000 g | INTRAVENOUS | Status: AC
  Administered 2022-07-08: 2 g via INTRAVENOUS
  Filled 2022-07-08: qty 100

## 2022-07-08 MED ORDER — ZOLPIDEM TARTRATE 5 MG PO TABS: 5.0000 mg | ORAL_TABLET | Freq: Every evening | ORAL | Status: DC | PRN

## 2022-07-08 MED ORDER — CHLORHEXIDINE GLUCONATE 0.12 % MT SOLN: 15.0000 mL | Freq: Once | OROMUCOSAL | Status: AC

## 2022-07-08 MED ORDER — BUPIVACAINE-EPINEPHRINE (PF) 0.5% -1:200000 IJ SOLN
INTRAMUSCULAR | Status: DC | PRN
  Administered 2022-07-08: 10 mL via PERINEURAL

## 2022-07-08 MED ORDER — ACETAMINOPHEN 10 MG/ML IV SOLN
INTRAVENOUS | Status: AC
  Filled 2022-07-08: qty 100

## 2022-07-08 MED ORDER — HYDROMORPHONE HCL 1 MG/ML IJ SOLN
0.5000 mg | INTRAMUSCULAR | Status: DC | PRN
  Administered 2022-07-09: 1 mg via INTRAVENOUS
  Filled 2022-07-08: qty 1

## 2022-07-08 MED ORDER — CYCLOBENZAPRINE HCL 10 MG PO TABS
10.0000 mg | ORAL_TABLET | Freq: Three times a day (TID) | ORAL | Status: DC | PRN
  Filled 2022-07-08 (×2): qty 1

## 2022-07-08 MED ORDER — BUPIVACAINE HCL (PF) 0.5 % IJ SOLN
INTRAMUSCULAR | Status: AC
  Filled 2022-07-08: qty 30

## 2022-07-08 SURGICAL SUPPLY — 65 items
APL SKNCLS STERI-STRIP NONHPOA (GAUZE/BANDAGES/DRESSINGS) ×1
BAG COUNTER SPONGE SURGICOUNT (BAG) ×2 IMPLANT
BAG SPNG CNTER NS LX DISP (BAG) ×1
BASKET BONE COLLECTION (BASKET) ×2 IMPLANT
BENZOIN TINCTURE PRP APPL 2/3 (GAUZE/BANDAGES/DRESSINGS) ×2 IMPLANT
BLADE CLIPPER SURG (BLADE) IMPLANT
BUR MATCHSTICK NEURO 3.0 LAGG (BURR) ×2 IMPLANT
BUR PRECISION FLUTE 6.0 (BURR) ×2 IMPLANT
CANISTER SUCT 3000ML PPV (MISCELLANEOUS) ×2 IMPLANT
CAP LOCK DLX THRD (Cap) IMPLANT
CNTNR URN SCR LID CUP LEK RST (MISCELLANEOUS) ×2 IMPLANT
CONT SPEC 4OZ STRL OR WHT (MISCELLANEOUS) ×1
COVER BACK TABLE 60X90IN (DRAPES) ×2 IMPLANT
DRAPE C-ARM 42X72 X-RAY (DRAPES) ×4 IMPLANT
DRAPE HALF SHEET 40X57 (DRAPES) ×2 IMPLANT
DRAPE LAPAROTOMY 100X72X124 (DRAPES) ×2 IMPLANT
DRAPE SURG 17X23 STRL (DRAPES) ×8 IMPLANT
DRSG OPSITE POSTOP 4X10 (GAUZE/BANDAGES/DRESSINGS) IMPLANT
DRSG OPSITE POSTOP 4X6 (GAUZE/BANDAGES/DRESSINGS) ×2 IMPLANT
ELECT BLADE 4.0 EZ CLEAN MEGAD (MISCELLANEOUS) ×1
ELECT REM PT RETURN 9FT ADLT (ELECTROSURGICAL) ×1
ELECTRODE BLDE 4.0 EZ CLN MEGD (MISCELLANEOUS) ×2 IMPLANT
ELECTRODE REM PT RTRN 9FT ADLT (ELECTROSURGICAL) ×2 IMPLANT
EVACUATOR 1/8 PVC DRAIN (DRAIN) IMPLANT
GAUZE 4X4 16PLY ~~LOC~~+RFID DBL (SPONGE) ×2 IMPLANT
GLOVE BIO SURGEON STRL SZ 6 (GLOVE) ×2 IMPLANT
GLOVE BIO SURGEON STRL SZ8 (GLOVE) ×4 IMPLANT
GLOVE BIO SURGEON STRL SZ8.5 (GLOVE) ×4 IMPLANT
GLOVE BIOGEL PI IND STRL 6.5 (GLOVE) ×2 IMPLANT
GLOVE EXAM NITRILE XL STR (GLOVE) IMPLANT
GOWN STRL REUS W/ TWL LRG LVL3 (GOWN DISPOSABLE) ×2 IMPLANT
GOWN STRL REUS W/ TWL XL LVL3 (GOWN DISPOSABLE) ×4 IMPLANT
GOWN STRL REUS W/TWL 2XL LVL3 (GOWN DISPOSABLE) IMPLANT
GOWN STRL REUS W/TWL LRG LVL3 (GOWN DISPOSABLE)
GOWN STRL REUS W/TWL XL LVL3 (GOWN DISPOSABLE) ×3
HEMOSTAT POWDER KIT SURGIFOAM (HEMOSTASIS) ×2 IMPLANT
KIT BASIN OR (CUSTOM PROCEDURE TRAY) ×2 IMPLANT
KIT GRAFTMAG DEL NEURO DISP (NEUROSURGERY SUPPLIES) IMPLANT
KIT TURNOVER KIT B (KITS) ×2 IMPLANT
NDL HYPO 21X1.5 SAFETY (NEEDLE) IMPLANT
NEEDLE HYPO 21X1.5 SAFETY (NEEDLE) IMPLANT
NEEDLE HYPO 22GX1.5 SAFETY (NEEDLE) ×2 IMPLANT
NS IRRIG 1000ML POUR BTL (IV SOLUTION) ×2 IMPLANT
PACK LAMINECTOMY NEURO (CUSTOM PROCEDURE TRAY) ×2 IMPLANT
PAD ARMBOARD 7.5X6 YLW CONV (MISCELLANEOUS) ×6 IMPLANT
PATTIES SURGICAL .5 X1 (DISPOSABLE) IMPLANT
PATTIES SURGICAL 1X1 (DISPOSABLE) IMPLANT
PUTTY DBM 10CC CALC GRAN (Putty) IMPLANT
PUTTY DBM 5CC CALC GRAN (Putty) IMPLANT
ROD CURVED TI 6.35X90 (Rod) IMPLANT
SCREW PA DLX CREO 7.5X50 (Screw) IMPLANT
SPACER ALTERA 10X31 8-12MM-8 (Spacer) IMPLANT
SPIKE FLUID TRANSFER (MISCELLANEOUS) ×2 IMPLANT
SPONGE NEURO XRAY DETECT 1X3 (DISPOSABLE) IMPLANT
SPONGE SURGIFOAM ABS GEL 100 (HEMOSTASIS) IMPLANT
SPONGE T-LAP 4X18 ~~LOC~~+RFID (SPONGE) IMPLANT
STRIP CLOSURE SKIN 1/2X4 (GAUZE/BANDAGES/DRESSINGS) ×2 IMPLANT
SUT VIC AB 1 CT1 18XBRD ANBCTR (SUTURE) ×4 IMPLANT
SUT VIC AB 1 CT1 8-18 (SUTURE) ×2
SUT VIC AB 2-0 CP2 18 (SUTURE) ×4 IMPLANT
SYR 20ML LL LF (SYRINGE) IMPLANT
TOWEL GREEN STERILE (TOWEL DISPOSABLE) ×2 IMPLANT
TOWEL GREEN STERILE FF (TOWEL DISPOSABLE) ×2 IMPLANT
TRAY FOLEY MTR SLVR 16FR STAT (SET/KITS/TRAYS/PACK) ×2 IMPLANT
WATER STERILE IRR 1000ML POUR (IV SOLUTION) ×2 IMPLANT

## 2022-07-08 NOTE — Op Note (Signed)
Brief history: The patient is a 77 year old white female on whom I performed an L4-5 and L5-S1 fusion.  She also had a left L3-4 and L4-5 discectomy.  She has had 2 recurrent herniations at the level.  I discussed the various treatment options with her.  She has decided to proceed with a lumbar fusion.  Preoperative diagnosis: Left L3-4 recurrent lumbar disc herniation, lumbar degenerative disc disease, spinal stenosis compressing both the L3 and the L4 nerve roots; lumbago; lumbar radiculopathy; neurogenic claudication  Postoperative diagnosis: The same and L4-5 and L5-S1 pseudoarthrosis  Procedure: Bilateral L3-4 laminotomy/foraminotomies/medial facetectomy to decompress the bilateral L3 and L4 nerve roots(the work required to do this was in addition to the work required to do the posterior lumbar interbody fusion because of the patient's recurrent herniated disc, epidural scar tissue,, facet arthropathy. Etc. requiring a wide decompression of the nerve roots.);  Left L3-4 transforaminal lumbar interbody fusion with local morselized autograft bone and Zimmer DBM; insertion of interbody prosthesis at L3-4 (globus peek expandable interbody prosthesis); posterior segmental instrumentation from L3 to S1 with globus titanium pedicle screws and rods; posterior lateral arthrodesis at L3-4 and redo posterolateral arthrodesis at L4-5 and L5-S1 with local morselized autograft bone and Zimmer DBM; exploration of lumbar fusion/removal of lumbar hardware.  Surgeon: Dr. Earle Gell  Asst.: None  Anesthesia: Gen. endotracheal  Estimated blood loss: 200 cc  Drains: 1 medium Hemovac drain in the epidural space  Complications: None  Description of procedure: The patient was brought to the operating room by the anesthesia team. General endotracheal anesthesia was induced. The patient was turned to the prone position on the Wilson frame. The patient's lumbosacral region was then prepared with Betadine scrub and  Betadine solution. Sterile drapes were applied.  I then injected the area to be incised with Marcaine with epinephrine solution. I then used the scalpel to make a linear midline incision over the L3-4, L4-5 and L5-S1 interspace, incising through the old surgical scar. I then used electrocautery to perform a bilateral subperiosteal dissection exposing the spinous process and lamina of L3-4, L4-5 and L5-S1 and to expose the old hardware at L4-5 and L5-S1. We then obtained intraoperative radiograph to confirm our location. We then inserted the Verstrac retractor to provide exposure.  We explored the wound by removing the caps from the old screws then remove the rods.  I inspected the posterior layer arthrodesis at L4-5 and L5-S1.  I could not identify a fusion indicative of a pseudoarthrosis.  I began the decompression by using the high speed drill to perform laminotomies at L3-4 bilaterally. We then used the Kerrison punches to widen the laminotomy and removed the ligamentum flavum at ligamentum flavum at L3-4 on the right and the epidural scar tissue at L3-4 on the left. We used the Kerrison punches to remove the medial facets at L3-4 bilaterally, we removed the left L4-5 facet. We performed wide foraminotomies about the bilateral L4 and L5 nerve roots.  As expected we encountered a large recurrent herniated disc at L3-4 on the left.  We carefully dissected away from the epidural scar tissue and thecal sac and removed it in multiple fragments using the pituitary forceps.  We now turned our attention to the posterior lumbar interbody fusion. I used a scalpel to incise the intervertebral disc at L3-4 bilaterally. I then performed a partial intervertebral discectomy at L3-4 bilaterally using the pituitary forceps. We prepared the vertebral endplates at G9-2 bilaterally for the fusion by removing the soft tissues  with the curettes. We then used the trial spacers to pick the appropriate sized interbody prosthesis.  We prefilled his prosthesis with a combination of local morselized autograft bone that we obtained during the decompression as well as Zimmer DBM. We inserted the prefilled prosthesis into the interspace at L3-4 from the left, we then turned and expanded the prosthesis. There was a good snug fit of the prosthesis in the interspace. We then filled and the remainder of the intervertebral disc space with local morselized autograft bone and Zimmer DBM. This completed the posterior lumbar interbody arthrodesis.  During the decompression and insertion of the prosthesis the assistant protected the thecal sac and nerve roots with the D'Errico retractor.  We now turned attention to the instrumentation. Under fluoroscopic guidance we cannulated the bilateral L3 pedicles with the bone probe. We then removed the bone probe. We then tapped the pedicle with a 6.5 millimeter tap. We then removed the tap. We probed inside the tapped pedicle with a ball probe to rule out cortical breaches. We then inserted a 7.5 x 50 millimeter pedicle screw into the L3 pedicles bilaterally under fluoroscopic guidance. We then palpated along the medial aspect of the pedicles to rule out cortical breaches. There were none. The nerve roots were not injured. We then connected the unilateral pedicle screws with a lordotic rod. We compressed the construct and secured the rod in place with the caps. We then tightened the caps appropriately. This completed the instrumentation from L3-S1 bilaterally.  We now turned our attention to the posterior lateral arthrodesis at L3-4 and the redo posterolateral arthrodesis at L4-5 and L5-S1.  We used electrocautery to expose the remainder of the facets transverse process, etc. at L4-5 and L5-S1.  We used the high-speed drill to decorticate the remainder of the facets, pars, transverse process at L3-4, L4-5 and L5-S1. We then applied a combination of local morselized autograft bone and Zimmer DBM over these  decorticated posterior lateral structures. This completed the posterior lateral arthrodesis at L3-4, L4-5 and L5-S1.  We then obtained hemostasis using bipolar electrocautery. We irrigated the wound out with saline solution. We inspected the thecal sac and nerve roots and noted they were well decompressed. We then removed the retractor.  We injected Exparel .  We placed a medium Hemovac drain in the epidural space and tunneled it out through a separate stab wound.  We reapproximated patient's thoracolumbar fascia with interrupted #1 Vicryl suture. We reapproximated patient's subcutaneous tissue with interrupted 2-0 Vicryl suture. The reapproximated patient's skin with Steri-Strips and benzoin. The wound was then coated with bacitracin ointment. A sterile dressing was applied. The drapes were removed. The patient was subsequently returned to the supine position where they were extubated by the anesthesia team. He was then transported to the post anesthesia care unit in stable condition. All sponge instrument and needle counts were reportedly correct at the end of this case.

## 2022-07-08 NOTE — H&P (Signed)
Subjective: The patient is a 77 year old white female on whom I previously formed a lumbar decompression, instrumentation and fusion and subsequent discectomy.  She has developed a recurrent lumbar herniated disc at L3-4.  I discussed the various treatment options with her.  She has decided proceed with surgery.  Past Medical History:  Diagnosis Date   Anemia    Arthritis    Bladder incontinence    Broken foot    Cataracts, bilateral    Chronic kidney insufficiency    stage 3b   COPD (chronic obstructive pulmonary disease) (HCC)    wheezing   Coronary artery disease 04/25/2022   in [place]   COVID 2021   very mild case   CVA (cerebral vascular accident) ([place]) 2016   has had 3 strokes, states right side is slightlyweaker than left   Diabetes mellitus    insulin dependent, Type 2   GERD (gastroesophageal reflux disease)    HLD (hyperlipidemia)    Hx of cardiovascular stress test    a. ETT (6/13):  Ex 5:13; no ischemic changes   Hypertension    controlled on meds   Lacunar stroke of left subthalamic region [facility]) 02/2015   Leg pain    left   Lower back pain    Neuromuscular disorder (HCC)    stroke right hand tingling   Orthostatic hypotension    Osteopenia 01/2017   T score -2.0 stable from prior DEXA   Pancreatitis 10/2021   PCOS (polycystic ovarian syndrome)    Personal history of tobacco use, presenting hazards to health 01/09/2015   PONV (postoperative nausea and vomiting)    Sleep apnea    uses CPAP nightly    Past Surgical History:  Procedure Laterality Date   BOTOX INJECTION  01/15/2022   Procedure: BOTOX INJECTION;  Surgeon: Clarene Essex, MD;  Location: [facility];  Service: Gastroenterology;;   Delphina Cahill LIFT Bilateral 11/04/2017   Procedure: BLEPHAROPLASTY UPPER EYELID W/EXCESS SKIN;  Surgeon: Karle Starch, MD;  Location: [place];  Service: Ophthalmology;  Laterality: Bilateral;  DIABETES-insulin dependent uses CPAP   CARDIAC CATHETERIZATION  20 yrs ago    found nothing   CARPAL TUNNEL RELEASE Bilateral    CATARACT EXTRACTION Bilateral    ELBOW SURGERY Bilateral    ESOPHAGOGASTRODUODENOSCOPY N/A 07/25/2021   Procedure: ESOPHAGOGASTRODUODENOSCOPY (EGD);  Surgeon: Toledo, Benay Pike, MD;  Location: [facility];  Service: Gastroenterology;  Laterality: N/A;  IDDM   ESOPHAGOGASTRODUODENOSCOPY N/A 01/15/2022   Procedure: ESOPHAGOGASTRODUODENOSCOPY (EGD);  Surgeon: Clarene Essex, MD;  Location: [facility];  Service: Gastroenterology;  Laterality: N/A;  botox   FOOT SURGERY     Groin Abscess     HAND SURGERY     KNEE SURGERY Bilateral    LABIAL ABSCESS     LUMBAR LAMINECTOMY/DECOMPRESSION MICRODISCECTOMY Left 05/11/2018   Procedure: LUMBAR LAMINECTOMY/DECOMPRESSION MICRODISCECTOMY 1 LEVEL- L4-5;  Surgeon: [name], MD;  Location: [facility];  Service: Neurosurgery;  Laterality: Left;   LUMBAR LAMINECTOMY/DECOMPRESSION MICRODISCECTOMY Left 05/20/2022   Procedure: MICRODISCECTOMY L3-4;  Surgeon: Newman Pies, MD;  Location: [place];  Service: Neurosurgery;  Laterality: Left;  3C   OOPHORECTOMY     BSO   PUBO VAG SLING     SHOULDER SURGERY     bilateral arthroscopies   VAGINAL HYSTERECTOMY  1979    Allergies  Allergen Reactions   Iodine Anaphylaxis and Other (See Comments)    Pt states that she is allergic to ingested iodine only, okay for betadine.     Shellfish  Allergy Anaphylaxis   Codeine Nausea And Vomiting   Morphine Sulfate Nausea And Vomiting   Irbesartan Other (See Comments)     Unknown  (Avapro)   Sulfa Antibiotics Other (See Comments)    Fever     Social History   Tobacco Use   Smoking status: Every Day    Packs/day: 1.50    Years: 50.00    Total pack years: 75.00    Types: Cigarettes   Smokeless tobacco: Never   Tobacco comments:    3ppd x 10 year, then cut back to 1.5pdd since 02/2015  Substance Use Topics   Alcohol use: Never    Alcohol/week: 0.0 standard drinks of alcohol    Family History  Problem Relation Age  of Onset   Hypertension Mother    Heart disease Mother 2       MI   Diabetes Father    Diabetes Sister    Hypertension Sister    Diabetes Brother    Hypertension Brother    Heart disease Brother 61       CAD   Cancer Sister        Multiple myloma   Diabetes Brother    Prior to Admission medications   Medication Sig Start Date End Date Taking? Authorizing Provider  acetaminophen (TYLENOL) 500 MG tablet Take 1,000 mg by mouth every 6 (six) hours as needed for moderate pain.   Yes [provider]  aspirin EC 81 MG tablet Take 81 mg by mouth at bedtime. Swallow whole.   Yes [provider]  calcitRIOL (ROCALTROL) 0.25 MCG capsule Take 0.25 mcg by mouth at bedtime. 08/17/21  Yes [provider]  clopidogrel (PLAVIX) 75 MG tablet Take 75 mg by mouth at bedtime.   Yes [provider]  D-MANNOSE PO Take 2 tablets by mouth at bedtime.   Yes [provider]  diazepam (VALIUM) 10 MG tablet Take 10 mg by mouth every 6 (six) hours as needed for anxiety.   Yes [provider]  diphenhydrAMINE (BENADRYL) 25 MG tablet Take 50 mg by mouth at bedtime as needed for sleep.   Yes [provider]  docusate sodium (COLACE) 100 MG capsule Take 1 capsule (100 mg total) by mouth 2 (two) times daily. Patient taking differently: Take 100 mg by mouth at bedtime. 05/21/22  Yes Newman Pies, MD  estradiol (ESTRACE) 0.1 MG/GM vaginal cream Place 1 Applicatorful vaginally 2 (two) times a week. 11/23/21  Yes [provider]  feeding supplement (BOOST HIGH PROTEIN) LIQD Take 1 Container by mouth daily.   Yes [provider]  insulin detemir (LEVEMIR) 100 UNIT/ML injection Inject 0.3-0.6 mLs (30-60 Units total) into the skin daily. Patient taking differently: Inject 15-30 Units into the skin daily. Takes in the morning 11/12/21  Yes Wieting, Richard, MD  losartan (COZAAR) 100 MG tablet Take 1 tablet (100 mg total) by mouth daily. Patient  taking differently: Take 100 mg by mouth at bedtime. 09/26/15  Yes Gladstone Lighter, MD  metoprolol succinate (TOPROL-XL) 100 MG 24 hr tablet Take 100 mg by mouth at bedtime.   Yes [provider]  Multiple Vitamins-Minerals (PRESERVISION AREDS 2+MULTI VIT PO) Take 1 capsule by mouth 2 (two) times daily.   Yes [provider]  nitrofurantoin, macrocrystal-monohydrate, (MACROBID) 100 MG capsule Take 100 mg by mouth at bedtime.   Yes [provider]  ondansetron (ZOFRAN) 4 MG tablet Take 4 mg by mouth every 8 (eight) hours as needed for nausea or  vomiting.   Yes [provider]  oxyCODONE-acetaminophen (PERCOCET/ROXICET) 5-325 MG tablet Take 1-2 tablets by mouth every 4 (four) hours as needed for moderate pain. Patient taking differently: Take 1 tablet by mouth every 4 (four) hours as needed for moderate pain. 05/21/22  Yes Newman Pies, MD  pantoprazole (PROTONIX) 40 MG tablet Take 40 mg by mouth 2 (two) times daily as needed (heartburn). 07/06/21  Yes [provider]  sennosides-docusate sodium (SENOKOT-S) 8.6-50 MG tablet Take 1 tablet by mouth at bedtime.   Yes [provider]  Vibegron (GEMTESA) 75 MG TABS Take 75 mg by mouth at bedtime.   Yes [provider]  zolpidem (AMBIEN) 10 MG tablet Take 10 mg by mouth at bedtime as needed for sleep. 12/20/21  Yes [provider]  BD INSULIN SYRINGE U/F 31G X 5/16" 1 ML MISC USE 1 SYRINGE AS DIRECTED 07/10/20   [provider]  [facility] ULTRA test strip 4 (four) times daily. 07/11/20   [provider]  promethazine (PHENERGAN) 25 MG tablet Take 1 tablet (25 mg total) by mouth every 6 (six) hours as needed for nausea. 05/21/22   Newman Pies, MD  sucralfate (CARAFATE) 1 g tablet Take 1 g by mouth 2 (two) times daily as needed (acid reflux).    [provider]     Review of Systems  Positive ROS: As above  All other systems have been reviewed and were  otherwise negative with the exception of those mentioned in the HPI and as above.  Objective: Vital signs in last 24 hours: Temp:  [98.2 F (36.8 C)] 98.2 F (36.8 C) (01/22 1419) Pulse Rate:  [75] 75 (01/22 1419) Resp:  [19] 19 (01/22 1419) BP: (194-199)/(62-66) 199/62 (01/22 1443) SpO2:  [100 %] 100 % (01/22 1419) Estimated body mass index is 26.31 kg/m as calculated from the following:   Height as of this encounter: '5\' 7"'$  (1.702 m).   Weight as of this encounter: 76.2 kg.   General Appearance: Alert Head: Normocephalic, without obvious abnormality, atraumatic Eyes: PERRL, conjunctiva/corneas clear, EOM's intact,    Ears: Normal  Throat: Normal  Neck: Supple, Back: Her lumbar incision is well-healed. Lungs: Clear to auscultation bilaterally, respirations unlabored Heart: Regular rate and rhythm, no murmur, rub or gallop Abdomen: Soft, non-tender Extremities: Extremities normal, atraumatic, no cyanosis or edema Skin: unremarkable  NEUROLOGIC:   Mental status: alert and oriented,Motor Exam - grossly normal Sensory Exam - grossly normal Reflexes:  Coordination - grossly normal Gait - grossly normal Balance - grossly normal Cranial Nerves: I: smell Not tested  II: visual acuity  OS: Normal  OD: Normal   II: visual fields Full to confrontation  II: pupils Equal, round, reactive to light  III,VII: ptosis None  III,IV,VI: extraocular muscles  Full ROM  V: mastication Normal  V: facial light touch sensation  Normal  V,VII: corneal reflex  Present  VII: facial muscle function - upper  Normal  VII: facial muscle function - lower Normal  VIII: hearing Not tested  IX: soft palate elevation  Normal  IX,X: gag reflex Present  XI: trapezius strength  5/5  XI: sternocleidomastoid strength 5/5  XI: neck flexion strength  5/5  XII: tongue strength  Normal    Data Review Lab Results  Component Value Date   WBC 11.4 (H) 07/08/2022   HGB 11.0 (L) 07/08/2022   HCT 34.4 (L)  07/08/2022   MCV 95.8 07/08/2022   PLT 369 07/08/2022   Lab Results  Component Value Date   NA 128 (L) 07/08/2022   K 4.1 07/08/2022   CL 96 (L) 07/08/2022   CO2 21 (L) 07/08/2022   BUN 29 (H) 07/08/2022   CREATININE 1.91 (H) 07/08/2022   GLUCOSE 139 (H) 07/08/2022   Lab Results  Component Value Date   INR 1.04 05/06/2018    Assessment/Plan: L3-4 degenerative disease, herniated disc, lumbago, lumbar radiculopathy, lumbar spinal stenosis, neurogenic claudication: I have discussed the situation with the patient.  I reviewed her MRI scan with her and pointed out the abnormalities.  We have discussed the various treatment options including surgery.  I have described the surgical treatment option of an exploration of her lumbar fusion with 2 L3-4 discectomy instrumentation and fusion.  I have shown her surgical models.  I have given her surgical pamphlet.  We have discussed the risk, benefits, alternatives, expected postoperative course, and likelihood of achieving our goals with surgery.  I have answered all the patient's questions.  She has decided proceed with surgery.   Mckenzie Lawrence 07/08/2022 4:39 PM

## 2022-07-08 NOTE — Anesthesia Postprocedure Evaluation (Signed)
Anesthesia Post Note  Patient: Mckenzie Lawrence  Procedure(s) Performed: L3-4 PLIF, Exploration of fusion, Redo Lumbar Four-Five, Lumbar Five-Sacral One Fusion (Back)     Patient location during evaluation: PACU Anesthesia Type: General Level of consciousness: awake Pain management: pain level controlled Vital Signs Assessment: post-procedure vital signs reviewed and stable Respiratory status: spontaneous breathing, nonlabored ventilation and respiratory function stable Cardiovascular status: blood pressure returned to baseline and stable Postop Assessment: no apparent nausea or vomiting Anesthetic complications: no   No notable events documented.  Last Vitals:  Vitals:   07/08/22 2200 07/08/22 2215  BP: (!) 161/72 (!) 171/66  Pulse: 76 75  Resp: 15 13  Temp:  (!) 36.1 C  SpO2: 98% 98%    Last Pain:  Vitals:   07/08/22 2215  TempSrc:   PainSc: 5                  Faruq Rosenberger P Jazzlynn Rawe

## 2022-07-08 NOTE — Transfer of Care (Signed)
Immediate Anesthesia Transfer of Care Note  Patient: Darylene Price Deloney  Procedure(s) Performed: L3-4 PLIF, Exploration of fusion, Redo Lumbar Four-Five, Lumbar Five-Sacral One Fusion (Back)  Patient Location: PACU  Anesthesia Type:General  Level of Consciousness: awake, alert , and drowsy  Airway & Oxygen Therapy: Patient Spontanous Breathing  Post-op Assessment: Report given to RN and Post -op Vital signs reviewed and stable  Post vital signs: Reviewed and stable  Last Vitals:  Vitals Value Taken Time  BP 163/111 07/08/22 2136  Temp    Pulse 85 07/08/22 2138  Resp 12 07/08/22 2138  SpO2 100 % 07/08/22 2138  Vitals shown include unvalidated device data.  Last Pain:  Vitals:   07/08/22 1419  TempSrc: Oral  PainSc: 6          Complications: No notable events documented.

## 2022-07-08 NOTE — Progress Notes (Addendum)
Dr. Therisa Doyne was notified about patient's high blood pressure. No new order at this time. Will continue to monitor.

## 2022-07-08 NOTE — Anesthesia Procedure Notes (Signed)
Procedure Name: Intubation Date/Time: 07/08/2022 5:27 PM  Performed by: Georgia Duff, CRNAPre-anesthesia Checklist: Patient identified, Emergency Drugs available, Suction available and Patient being monitored Patient Re-evaluated:Patient Re-evaluated prior to induction Oxygen Delivery Method: Circle System Utilized Preoxygenation: Pre-oxygenation with 100% oxygen Induction Type: IV induction Ventilation: Mask ventilation without difficulty Laryngoscope Size: Glidescope and 3 Grade View: Grade I Tube type: Oral Tube size: 7.5 mm Number of attempts: 1 Airway Equipment and Method: Stylet and Oral airway Placement Confirmation: ETT inserted through vocal cords under direct vision, positive ETCO2 and breath sounds checked- equal and bilateral Secured at: 21 cm Tube secured with: Tape Dental Injury: Teeth and Oropharynx as per pre-operative assessment

## 2022-07-09 LAB — BASIC METABOLIC PANEL
Anion gap: 9 (ref 5–15)
BUN: 30 mg/dL — ABNORMAL HIGH (ref 8–23)
CO2: 25 mmol/L (ref 22–32)
Calcium: 8.6 mg/dL — ABNORMAL LOW (ref 8.9–10.3)
Chloride: 97 mmol/L — ABNORMAL LOW (ref 98–111)
Creatinine, Ser: 2.1 mg/dL — ABNORMAL HIGH (ref 0.44–1.00)
GFR, Estimated: 24 mL/min — ABNORMAL LOW (ref >60)
Glucose, Bld: 99 mg/dL (ref 70–99)
Potassium: 4.6 mmol/L (ref 3.5–5.1)
Sodium: 131 mmol/L — ABNORMAL LOW (ref 135–145)

## 2022-07-09 LAB — GLUCOSE, CAPILLARY
Glucose-Capillary: 172 mg/dL — ABNORMAL HIGH (ref 70–99)
Glucose-Capillary: 127 mg/dL — ABNORMAL HIGH (ref 70–99)
Glucose-Capillary: 100 mg/dL — ABNORMAL HIGH (ref 70–99)
Glucose-Capillary: 134 mg/dL — ABNORMAL HIGH (ref 70–99)
Glucose-Capillary: 142 mg/dL — ABNORMAL HIGH (ref 70–99)

## 2022-07-09 MED FILL — Thrombin For Soln 5000 Unit: CUTANEOUS | Qty: 5000 | Status: AC

## 2022-07-09 NOTE — Progress Notes (Signed)
Orthopedic Tech Progress Note Patient Details:  Mckenzie Lawrence [birth year] [id number]  Ortho Devices Type of Ortho Device: Lumbar corsett Ortho Device/Splint Interventions: Ordered, Adjustment  PT called requesting new LSO back brace. Patient had brace that is too big.     Mckenzie Lawrence 07/09/2022, 11:10 AM

## 2022-07-09 NOTE — Progress Notes (Signed)
Subjective: The patient is alert and pleasant.  She looks well.  Objective: Vital signs in last 24 hours: Temp:  [97 F (36.1 C)-98.2 F (36.8 C)] 97.9 F (36.6 C) (01/23 0457) Pulse Rate:  [74-83] 83 (01/23 0457) Resp:  [12-19] 16 (01/23 0457) BP: (136-199)/(62-111) 136/97 (01/23 0457) SpO2:  [98 %-100 %] 98 % (01/23 0457) Estimated body mass index is 26.31 kg/m as calculated from the following:   Height as of this encounter: '5\' 7"'$  (1.702 m).   Weight as of this encounter: 76.2 kg.   Intake/Output from previous day: 01/22 0701 - 01/23 0700 In: 1603.2 [P.O.:200; I.V.:1203.2; IV Piggyback:200] Out: 575 [Urine:350; Blood:225] Intake/Output this shift: No intake/output data recorded.  Physical exam the patient is alert and pleasant.  Her strength is grossly normal.  Lab Results: Recent Labs    07/08/22 1420  WBC 11.4*  HGB 11.0*  HCT 34.4*  PLT 369   BMET Recent Labs    07/08/22 1420  NA 128*  K 4.1  CL 96*  CO2 21*  GLUCOSE 139*  BUN 29*  CREATININE 1.91*  CALCIUM 9.1    Studies/Results: DG C-Arm 1-60 Min-No Report  Result Date: 07/08/2022 Fluoroscopy was utilized by the requesting physician.  No radiographic interpretation.    Assessment/Plan: Postop day 1: The patient is doing well.  We will mobilize with PT and OT.  We will discontinue Hemovac drain.  She would likely go home today or tomorrow.  Hyponatremia: I will recheck her sodium today.  LOS: 1 day     [name] 07/09/2022, 7:52 AM     Patient ID: Mckenzie Lawrence, female   DOB: 07-22-1945, 77 y.o.   MRN: [id number]

## 2022-07-09 NOTE — Progress Notes (Signed)
Mobility Specialist Progress Note   07/09/22 1605  Mobility  Activity Ambulated with assistance in hallway  Level of Assistance Minimal assist, patient does 75% or more  Assistive Device Front wheel walker  Distance Ambulated (ft) 220 ft  Activity Response Tolerated well  Mobility Referral Yes  $Mobility charge 1 Mobility   Post Mobility: 161/57 BP  Received pt in doorway eager to go walking. Min cues on posture during ambulation but no faults or complaints throughout. Returned back to room, minA to get pt's LE's back in bed. Pt c/o L sided pain throughout body once supine in bed. RN notified.   Holland Falling Mobility Specialist Please contact via SecureChat or  Rehab office at [phone]

## 2022-07-09 NOTE — Progress Notes (Signed)
Pt refused CPAP for tonight 

## 2022-07-09 NOTE — Progress Notes (Addendum)
At bedside for PIV consult. Per daughter, PIV attempted refuse at this time. Pt has no IV meds or fluids ordered.  RN made aware and will re consult if orders change.

## 2022-07-09 NOTE — Progress Notes (Signed)
Orthopedic Tech Progress Note Patient Details:  Mckenzie Lawrence 1945-11-15 [id number]  Patient ID: Mckenzie Lawrence, female   DOB: 03-23-1946, 77 y.o.   MRN: [id number] I spoke with the patients RN they said the patient already has the brace. Mckenzie Lawrence 07/09/2022, 6:06 AM

## 2022-07-09 NOTE — TOC Initial Note (Incomplete)
Transition of Care [facility]) - Initial/Assessment Note    Patient Details  Name: Mckenzie Lawrence MRN: [id number] Date of Birth: 01/24/1946  Transition of Care [facility]) CM/SW Contact:    Sharin Mons, RN Phone Number: 07/09/2022, 4:33 PM  Clinical Narrative:                   -  S/p redo of L4-5 and L5-S1 PLIF , 07/08/2022 From home with husband ( primary caregiver). Supportive daughter,[name] Bowers,[phone]. Active with [facility]. Pt without DME needs.        Patient Goals and CMS Choice            Expected Discharge Plan and Services                                              Prior Living Arrangements/Services                       Activities of Daily Living Home Assistive Devices/Equipment: CPAP, Eyeglasses, Walker (specify type) ADL Screening (condition at time of admission) Patient's cognitive ability adequate to safely complete daily activities?: Yes Is the patient deaf or have difficulty hearing?: No Does the patient have difficulty seeing, even when wearing glasses/contacts?: No Does the patient have difficulty concentrating, remembering, or making decisions?: No Patient able to express need for assistance with ADLs?: Yes Does the patient have difficulty dressing or bathing?: No Independently performs ADLs?: Yes (appropriate for developmental age) Does the patient have difficulty walking or climbing stairs?: Yes Weakness of Legs: Both Weakness of Arms/Hands: None  Permission Sought/Granted                  Emotional Assessment              Admission diagnosis:  S/P lumbar spinal fusion [Z98.1] Recurrent displacement of lumbar disc [M51.26] Patient Active Problem List   Diagnosis Date Noted   S/P lumbar spinal fusion 07/08/2022   Recurrent displacement of lumbar disc 05/20/2022   Acute cystitis without hematuria    Acute pancreatitis 11/10/2021   Acute lower UTI 11/10/2021   Diabetes mellitus with renal  manifestations, controlled ([place]) 11/10/2021   CKD (chronic kidney disease) stage 4, GFR 15-29 ml/min ([place]) 11/10/2021   Lumbar spinal stenosis 10/01/2021   Situational anxiety 07/08/2019   Chronic kidney disease, stage 3 unspecified ([place]) 03/01/2019   Edema of lower extremity 03/01/2019   Proteinuria 03/01/2019   Encounter for orthopedic follow-up care 11/04/2018   CAD (coronary artery disease) 10/26/2018   Pain in right hand 07/22/2018   Degeneration of lumbar intervertebral disc 02/06/2018   Scoliosis deformity of spine 02/06/2018   Spondylolisthesis, grade 1 02/06/2018   Low back pain 02/04/2018   Spinal stenosis of lumbar region 02/04/2018   Trigger finger 01/26/2018   CVA (cerebral vascular accident) ([place]) 03/25/2016   OSA (obstructive sleep apnea) 02/15/2016   Hyponatremia 09/25/2015   Vitamin B12 deficiency 09/12/2015   Lacunar stroke of left subthalamic region [facility]) 09/12/2015   Diabetic polyneuropathy associated with diabetes mellitus due to underlying condition ([place]) 09/12/2015   Stroke ([place]) 03/15/2015   Accelerated hypertension 03/15/2015   Vertigo 09/24/2013   Female stress incontinence 02/10/2013   Incomplete emptying of bladder 02/10/2013   Increased frequency of urination 02/10/2013   Urge incontinence 02/10/2013   Syncope 12/04/2011   Tobacco abuse  12/04/2011   Elevated cholesterol    Cataracts, bilateral    Hypertension    Broken foot    PCOS (polycystic ovarian syndrome)    Type 2 diabetes mellitus with hyperlipidemia ([place])    Osteopenia    PCP:  Maryland Pink, MD Pharmacy:   [place], [place] - [address] [place] [address] Phone: [phone] Fax: [phone]  [facility] [place], [place] - [place] [facility] [address] AT [address] [facility] [place] [address] Phone: [phone] Fax: [phone]     Social Determinants of Health (SDOH) Social History: [place]: No Food Insecurity (05/21/2022)  Housing: Low Risk  (05/21/2022)  Transportation Needs: No Transportation Needs (05/21/2022)  Utilities: Not At Risk (05/21/2022)  Tobacco Use: High Risk (07/08/2022)   SDOH Interventions:     Readmission Risk Interventions    11/10/2021    2:49 PM  Readmission Risk Prevention Plan  Transportation Screening Complete  PCP or Specialist Appt within 3-5 Days Complete  HRI or [place] Complete  Social Work Consult for [place] Planning/Counseling Complete  Palliative Care Screening Not Applicable  Medication Review Press photographer) Complete

## 2022-07-09 NOTE — Evaluation (Signed)
Occupational Therapy Evaluation Patient Details Name: Mckenzie Lawrence MRN: [id number] DOB: 1945-06-21 Today's Date: 07/09/2022   History of Present Illness Patient is a 77 y.o. female on who underwent L4-5 and L5-S1 decompression, instrumentation and fusion in April 2023. She developed recurrent back and left leg pain consistent with a lumbar radiculopathy. Worked up with lumbar MRI demonstrated large herniated disc at L3-4 on the left. Pt now s/p L3-4 redo intervertebral discectomy via micro-dissection on 05/20/22. Now s/p L3-4 PLIF, Exploration of fusion, Redo Lumbar Four-Five, Lumbar Five-Sacral One Fusion (Back) on 07/08/22. PMH includes: cataracts, COPD, CVA, DM, arthritis, HTN, orthostatic hypotension, prior back surgery, bil shoulder surgery.   Clinical Impression   Pt admitted as above presenting with deficits as listed below (refer to OT problem list). Pt lives with spouse (whom had surgery on 07/08/22) in 1 story home with 4-5 steps to enter. [name] daughter and niece to provide 24/7 PRN assist for next 1 1/2 weeks depending on pt progress. Pt has necessary DME and a/e but is currently Min guard-min A for functional mobility/transfers and adherence to back precautions and Min A LB ADL's. She should benefit from acute OT followed by [facility] to assist in maximizing independence with ADL's, functional mobility.      Recommendations for follow up therapy are one component of a multi-disciplinary discharge planning process, led by the attending physician.  Recommendations may be updated based on patient status, additional functional criteria and insurance authorization.   Follow Up Recommendations  Home health OT     Assistance Recommended at Discharge PRN  Patient can return home with the following A little help with walking and/or transfers;A little help with bathing/dressing/bathroom;Assistance with cooking/housework;Help with stairs or ramp for entrance;Assist for transportation    Functional  Status Assessment  Patient has had a recent decline in their functional status and demonstrates the ability to make significant improvements in function in a reasonable and predictable amount of time.  Equipment Recommendations  None recommended by OT (Pt has necessary DME)    Recommendations for Other Services       Precautions / Restrictions Precautions Precautions: Back Precaution Booklet Issued: Yes (comment) Required Braces or Orthoses: Spinal Brace Spinal Brace: Applied in sitting position Restrictions Weight Bearing Restrictions: Yes Other Position/Activity Restrictions: WBAT      Mobility Bed Mobility Overal bed mobility: Needs Assistance Bed Mobility: Rolling, Sidelying to Sit Rolling: Min assist Sidelying to sit: Min guard       General bed mobility comments: Pt required min vc's and tc's during bed mobility to adhere to back precautions as pt was noted to attempt trunk rotation during both rolling and sidlying to sit.    Transfers Overall transfer level: Needs assistance Equipment used: Rolling walker (2 wheels) (Pt uses rollator at home) Transfers: Sit to/from Stand Sit to Stand: Min guard, From elevated surface     Step pivot transfers: Min guard     General transfer comment: Pt was min assist donning back brace in sitting - sit to stand from elevated surface w/ vc's for hand placement RW      Balance Overall balance assessment: Needs assistance, Mild deficits observed, not formally tested Sitting-balance support: No upper extremity supported, Feet supported Sitting balance-Leahy Scale: Good     Standing balance support: Bilateral upper extremity supported, Reliant on assistive device for balance Standing balance-Leahy Scale: Fair Standing balance comment: Reliant on RW in standing - pt with nausea     ADL either performed or assessed with clinical  judgement   ADL Overall ADL's : Needs assistance/impaired Eating/Feeding: Modified  independent;Sitting   Grooming: Wash/dry hands;Wash/dry face;Oral care;Min guard;Standing;Minimal assistance Grooming Details (indicate cue type and reason): Pt with 1 LOB standing at sink when grooming but was able to recover with min guard-min assist Upper Body Bathing: Set up;Minimal assistance;Sitting   Lower Body Bathing: Minimal assistance;Sit to/from stand;Adhering to back precautions   Upper Body Dressing : Minimal assistance;Sitting Upper Body Dressing Details (indicate cue type and reason): cues for back precautions Lower Body Dressing: Minimal assistance;Sit to/from stand;Cueing for back precautions   Toilet Transfer: Min guard;Rolling walker (2 wheels);Ambulation;Grab Information systems manager Details (indicate cue type and reason): Simulated transfer from EOB into bathroom and back to sitting EOB Toileting- Clothing Manipulation and Hygiene: Cueing for back precautions;Sit to/from stand;Min guard   Tub/Shower Transfer Details (indicate cue type and reason): TBD Functional mobility during ADLs: Cueing for safety;Min guard;Rolling walker (2 wheels) General ADL Comments: Pt and family were educated in role of OT and back precautions as well as a/e and DME. Pt reports that she has necessary DME and daughter and niece to assist PRN 24/7 for next week and a half as pt spouse had surgery on 07/08/22. Pt should benefit from acute OT followed by [facility] for home set up and recommendations following this most recent surgery.     Vision Baseline Vision/History: 1 Wears glasses Patient Visual Report: No change from baseline Vision Assessment?: No apparent visual deficits            Pertinent Vitals/Pain Pain Assessment Pain Assessment: 0-10 Pain Score: 6  Pain Descriptors / Indicators: Radiating, Sharp, Aching, Grimacing Pain Intervention(s): Limited activity within patient's tolerance, Monitored during session     Hand Dominance Right   Extremity/Trunk Assessment Upper  Extremity Assessment Upper Extremity Assessment: Overall WFL for tasks assessed   Lower Extremity Assessment Lower Extremity Assessment: Defer to PT evaluation     Communication Communication Communication: No difficulties   Cognition Arousal/Alertness: Awake/alert Behavior During Therapy: WFL for tasks assessed/performed Overall Cognitive Status: Within Functional Limits for tasks assessed                Home Living Family/patient expects to be discharged to:: Private residence Living Arrangements: Spouse/significant other (Spouse had surgery on 07/08/21) Available Help at Discharge: Family;Available 24 hours/day;Other (Comment) (Pt spouse had surgery on 07/08/22, pt daughter and niece will provide 24/7 PRN assist for next 1 1/2 weeks) Type of Home: Other(Comment) Home Access: Stairs to enter Entrance Stairs-Number of Steps: 4-5 Entrance Stairs-Rails: Right;Left;Can reach both Home Layout: One level     Bathroom Shower/Tub: Occupational psychologist: Handicapped height Bathroom Accessibility: Yes   Home Equipment: BSC/3in1;Shower seat - built in;Grab bars - toilet;Grab bars - tub/shower;Rollator (4 wheels);Rolling Walker (2 wheels);Hand held shower head;Adaptive equipment;Other (comment) (Has bar on standard bed)   Additional Comments: pt lives with her spouse whom had surgery on 07/08/21, will have assist from her daughter as well.      Prior Functioning/Environment Prior Level of Function : Independent/Modified Independent   Mobility Comments: Since surgery pt has been ambulating with Rollator & receiving HHPT. ADLs Comments: Pt required assist for bathing back and legs, ADLs. Has DME and a/e at home, spouse completes IADLS        OT Problem List: Impaired balance (sitting and/or standing);Pain;Decreased activity tolerance;Decreased knowledge of use of DME or AE      OT Treatment/Interventions: Self-care/ADL training;Therapeutic activities;Patient/family  education    OT  Goals(Current goals can be found in the care plan section) Acute Rehab OT Goals Patient Stated Goal: Go home OT Goal Formulation: With patient Time For Goal Achievement: 07/23/22 Potential to Achieve Goals: Good  OT Frequency: Min 2X/week       AM-PAC OT "6 Clicks" Daily Activity     Outcome Measure Help from another person eating meals?: None Help from another person taking care of personal grooming?: A Little Help from another person toileting, which includes using toliet, bedpan, or urinal?: A Little Help from another person bathing (including washing, rinsing, drying)?: A Little Help from another person to put on and taking off regular upper body clothing?: A Little Help from another person to put on and taking off regular lower body clothing?: A Little 6 Click Score: 19   End of Session Equipment Utilized During Treatment: Gait belt;Rolling walker (2 wheels);Back brace Nurse Communication: Mobility status;Other (comment) (nausea and vomiting)  Activity Tolerance: Other (comment) (Pt limited by nausea nad vomiting - RN gave medication) Patient left: in bed;Other (comment) (Sitting up at EOB with PT in room)  OT Visit Diagnosis: Unsteadiness on feet (R26.81);Pain Pain - Right/Left:  (Low back) Pain - part of body:  (LBP)                Time: 4034-7425 OT Time Calculation (min): 26 min Charges:  OT General Charges $OT Visit: 1 Visit OT Evaluation $OT Eval Low Complexity: 1 Low  Smiley Birr Beth Dixon, OTR/L 07/09/2022, 10:22 AM

## 2022-07-09 NOTE — Plan of Care (Signed)
  Problem: Activity: Goal: Risk for activity intolerance will decrease Outcome: Progressing   Problem: Coping: Goal: Level of anxiety will decrease Outcome: Progressing   Problem: Pain Managment: Goal: General experience of comfort will improve Outcome: Progressing

## 2022-07-09 NOTE — Evaluation (Signed)
Physical Therapy Evaluation Patient Details Name: Mckenzie Lawrence MRN: [id number] DOB: [birth year] Today's Date: 07/09/2022  History of Present Illness  Pt is a 77 y/o female presenting on 07/08/22 for L3-4 PLIF with redo of L4-5 and L5-S1 PLIF that was done in 4/23. PMH includes: cataracts, COPD, CVA, DM, arthritis, HTN, orthostatic hypotension, prior back surgery, bil shoulder surgery.  Clinical Impression  Pt admitted with above diagnosis. Pt received sitting EOB after OT, daughter and son in law present. [name] husband just had surgery but is now home and daughter will stay with them through this weekend at which point pt's niece will come for a week. Pt limited by nausea today. Is present all the time but worsens when pt has been mobilizing approx 3 mins to the point that she vomits. Pt has received Zophran an hour prior to eval. Pt mobilizing with min A with RW with pain radiating down LLE but she reports it is less intense than before surgery. Recommend home with HHPT but do not anticipate her being ready to go today. Of note, pt's brace is too large as she has lost !50 lbs since receiving it.  Pt currently with functional limitations due to the deficits listed below (see PT Problem List). Pt will benefit from skilled PT to increase their independence and safety with mobility to allow discharge to the venue listed below.          Recommendations for follow up therapy are one component of a multi-disciplinary discharge planning process, led by the attending physician.  Recommendations may be updated based on patient status, additional functional criteria and insurance authorization.  Follow Up Recommendations Home health PT      Assistance Recommended at Discharge Intermittent Supervision/Assistance  Patient can return home with the following  A little help with walking and/or transfers;A little help with bathing/dressing/bathroom;Assistance with cooking/housework;Assist for transportation;Help  with stairs or ramp for entrance    Equipment Recommendations None recommended by PT  Recommendations for Other Services       Functional Status Assessment Patient has had a recent decline in their functional status and demonstrates the ability to make significant improvements in function in a reasonable and predictable amount of time.     Precautions / Restrictions Precautions Precautions: Back Precaution Booklet Issued: Yes (comment) Required Braces or Orthoses: Spinal Brace Spinal Brace: Applied in sitting position Restrictions Weight Bearing Restrictions: No Other Position/Activity Restrictions: WBAT      Mobility  Bed Mobility Overal bed mobility: Needs Assistance Bed Mobility: Sit to Supine, Rolling Rolling: Min assist     Sit to supine: Min assist   General bed mobility comments: min A to LE's for return to supine from sitting. With rolling needed cues to avoid twisting as well as min A to complete roll    Transfers Overall transfer level: Needs assistance Equipment used: Rolling walker (2 wheels) (Pt uses rollator at home) Transfers: Sit to/from Stand Sit to Stand: Min guard, From elevated surface           General transfer comment: vc's for hand placement, min A to steady    Ambulation/Gait Ambulation/Gait assistance: Min guard Gait Distance (Feet): 30 Feet Assistive device: Rolling walker (2 wheels) Gait Pattern/deviations: Step-through pattern, Trunk flexed Gait velocity: decreased Gait velocity interpretation: <1.8 ft/sec, indicate of risk for recurrent falls   General Gait Details: pt became nauseous at 68' and needed to turn back to room, she experienced nausea with OT at about the same amount of time up.  Pt motivated to ambulate but limited by this.  Stairs            Wheelchair Mobility    Modified Rankin (Stroke Patients Only)       Balance Overall balance assessment: Needs assistance, Mild deficits observed, not formally  tested Sitting-balance support: No upper extremity supported, Feet supported Sitting balance-Leahy Scale: Good     Standing balance support: Bilateral upper extremity supported, Reliant on assistive device for balance Standing balance-Leahy Scale: Fair Standing balance comment: Reliant on RW in standing - pt with nausea                             Pertinent Vitals/Pain Pain Assessment Pain Assessment: 0-10 Pain Score: 6  Pain Location: low back and into LLE Pain Descriptors / Indicators: Radiating, Sharp, Aching, Grimacing Pain Intervention(s): Limited activity within patient's tolerance, Monitored during session, Premedicated before session    Home Living Family/patient expects to be discharged to:: Private residence Living Arrangements: Spouse/significant other (Spouse had surgery on 07/08/21) Available Help at Discharge: Family;Available 24 hours/day Type of Home: House Home Access: Stairs to enter Entrance Stairs-Rails: Right;Left;Can reach both Entrance Stairs-Number of Steps: 4-5   Home Layout: One level Home Equipment: BSC/3in1;Shower seat - built in;Grab bars - toilet;Grab bars - tub/shower;Rollator (4 wheels);Rolling Walker (2 wheels);Hand held shower head;Adaptive equipment;Other (comment) (Has bar on standard bed) Additional Comments: pt lives with her spouse whom had surgery on 07/08/21, will have assist from her daughter until 1/28 and then niece for the next week and family is talking about what help they can get for household task assistance after that    Prior Function Prior Level of Function : Independent/Modified Independent             Mobility Comments: Since surgery pt has been ambulating with Rollator & receiving HHPT. ADLs Comments: Pt required assist for bathing back and legs, ADLs. Has DME and a/e at home, spouse completes IADLS     Hand Dominance   Dominant Hand: Right    Extremity/Trunk Assessment   Upper Extremity Assessment Upper  Extremity Assessment: Defer to OT evaluation    Lower Extremity Assessment Lower Extremity Assessment: RLE deficits/detail;LLE deficits/detail RLE Deficits / Details: knee ext 3+/5, back pain with resisted knee flex. Ankle WFL, did not MMT hip due to pain but sufficient strength for sit>stand RLE: Unable to fully assess due to pain RLE Sensation: WNL RLE Coordination: decreased gross motor LLE Deficits / Details: grossly the same as R however, did not test thoroughly due to pain LLE: Unable to fully assess due to pain LLE Sensation: decreased proprioception LLE Coordination: decreased gross motor    Cervical / Trunk Assessment Cervical / Trunk Assessment: Back Surgery  Communication   Communication: No difficulties  Cognition Arousal/Alertness: Awake/alert Behavior During Therapy: WFL for tasks assessed/performed Overall Cognitive Status: Within Functional Limits for tasks assessed                                 General Comments: pt nauseous with pain meds (she has to manage this at home as well) but very internally foocused on eval because of this        General Comments General comments (skin integrity, edema, etc.): VSS. Daughter reports that pt does not have much appetite and has lost >50lbs in past year. Noted that brace is too large and pt would  benefit from smaller size.    Exercises     Assessment/Plan    PT Assessment Patient needs continued PT services  PT Problem List Decreased strength;Decreased activity tolerance;Decreased balance;Decreased mobility;Decreased coordination;Decreased knowledge of precautions;Pain       PT Treatment Interventions DME instruction;Gait training;Stair training;Functional mobility training;Therapeutic activities;Therapeutic exercise;Balance training;Patient/family education    PT Goals (Current goals can be found in the Care Plan section)  Acute Rehab PT Goals Patient Stated Goal: return home, decreased nausea PT  Goal Formulation: With patient Time For Goal Achievement: 07/23/22 Potential to Achieve Goals: Good    Frequency Min 5X/week     Co-evaluation               AM-PAC PT "6 Clicks" Mobility  Outcome Measure Help needed turning from your back to your side while in a flat bed without using bedrails?: A Little Help needed moving from lying on your back to sitting on the side of a flat bed without using bedrails?: A Little Help needed moving to and from a bed to a chair (including a wheelchair)?: A Little Help needed standing up from a chair using your arms (e.g., wheelchair or bedside chair)?: A Little Help needed to walk in hospital room?: A Little Help needed climbing 3-5 steps with a railing? : A Little 6 Click Score: 18    End of Session Equipment Utilized During Treatment: Gait belt;Back brace Activity Tolerance: Treatment limited secondary to medical complications (Comment) (nausea) Patient left: in bed;with call bell/phone within reach;with family/visitor present Nurse Communication: Mobility status PT Visit Diagnosis: Unsteadiness on feet (R26.81);Pain;Difficulty in walking, not elsewhere classified (R26.2) Pain - part of body:  (back)    Time: 6948-5462 PT Time Calculation (min) (ACUTE ONLY): 30 min   Charges:   PT Evaluation $PT Eval Moderate Complexity: 1 Mod PT Treatments $Gait Training: 8-22 mins        [place] chat preferred Office [place] 07/09/2022, 10:54 AM

## 2022-07-10 LAB — GLUCOSE, CAPILLARY
Glucose-Capillary: 108 mg/dL — ABNORMAL HIGH (ref 70–99)
Glucose-Capillary: 111 mg/dL — ABNORMAL HIGH (ref 70–99)
Glucose-Capillary: 146 mg/dL — ABNORMAL HIGH (ref 70–99)
Glucose-Capillary: 236 mg/dL — ABNORMAL HIGH (ref 70–99)
Glucose-Capillary: 239 mg/dL — ABNORMAL HIGH (ref 70–99)
Glucose-Capillary: 403 mg/dL — ABNORMAL HIGH (ref 70–99)

## 2022-07-10 MED ORDER — NITROFURANTOIN MONOHYD MACRO 100 MG PO CAPS
100.0000 mg | ORAL_CAPSULE | Freq: Every day | ORAL | Status: DC
  Administered 2022-07-10: 100 mg via ORAL
  Filled 2022-07-10 (×2): qty 1

## 2022-07-10 MED ORDER — ACETAMINOPHEN 650 MG RE SUPP: 650.0000 mg | Freq: Four times a day (QID) | RECTAL | Status: DC

## 2022-07-10 MED ORDER — DEXAMETHASONE SODIUM PHOSPHATE 10 MG/ML IJ SOLN
4.0000 mg | Freq: Four times a day (QID) | INTRAMUSCULAR | Status: AC
  Administered 2022-07-10: 4 mg via INTRAVENOUS
  Administered 2022-07-10: 2 mg via INTRAVENOUS
  Filled 2022-07-10 (×2): qty 1

## 2022-07-10 MED ORDER — LOSARTAN POTASSIUM 50 MG PO TABS
100.0000 mg | ORAL_TABLET | Freq: Every day | ORAL | Status: DC
  Administered 2022-07-10: 100 mg via ORAL
  Filled 2022-07-10: qty 2

## 2022-07-10 MED ORDER — METOPROLOL SUCCINATE ER 50 MG PO TB24
100.0000 mg | ORAL_TABLET | Freq: Every day | ORAL | Status: DC
  Administered 2022-07-10: 100 mg via ORAL
  Filled 2022-07-10: qty 2

## 2022-07-10 MED ORDER — DOCUSATE SODIUM 100 MG PO CAPS
100.0000 mg | ORAL_CAPSULE | Freq: Two times a day (BID) | ORAL | Status: DC
  Administered 2022-07-10 – 2022-07-11 (×2): 100 mg via ORAL
  Filled 2022-07-10 (×2): qty 1

## 2022-07-10 MED ORDER — ACETAMINOPHEN 325 MG PO TABS
650.0000 mg | ORAL_TABLET | Freq: Four times a day (QID) | ORAL | Status: DC
  Administered 2022-07-10 – 2022-07-11 (×6): 650 mg via ORAL
  Filled 2022-07-10 (×6): qty 2

## 2022-07-10 MED ORDER — TIZANIDINE HCL 4 MG PO TABS
2.0000 mg | ORAL_TABLET | Freq: Three times a day (TID) | ORAL | Status: DC
  Administered 2022-07-10: 2 mg via ORAL
  Filled 2022-07-10: qty 1

## 2022-07-10 MED ORDER — MIRABEGRON ER 25 MG PO TB24
25.0000 mg | ORAL_TABLET | Freq: Every day | ORAL | Status: DC
  Administered 2022-07-10: 25 mg via ORAL
  Filled 2022-07-10 (×2): qty 1

## 2022-07-10 MED ORDER — TIZANIDINE HCL 4 MG PO TABS
2.0000 mg | ORAL_TABLET | Freq: Three times a day (TID) | ORAL | Status: DC
  Administered 2022-07-11 (×2): 2 mg via ORAL
  Filled 2022-07-10 (×2): qty 1

## 2022-07-10 NOTE — Progress Notes (Signed)
Occupational Therapy Treatment Patient Details Name: Mckenzie Lawrence MRN: [id number] DOB: 10/10/1945 Today's Date: 07/10/2022   History of present illness Pt is a 77 y/o female presenting on 07/08/22 for L3-4 PLIF with redo of L4-5 and L5-S1 PLIF that was done in 4/23. PMH includes: cataracts, COPD, CVA, DM, arthritis, HTN, orthostatic hypotension, prior back surgery, bil shoulder surgery.   OT comments  Pt received in bathroom sitting on 3:1 over toilet in pt bathroom. Pt was seen for OT ADL retraining session today with focus on toilet transfer and shower transfer. Verbal discussion on shower transfers and use of rollator - pt with level entry shower, grab bars, hand held shower head and shower chair. Discussed back precautions during shower and recommendation to get assist to wash back, cross legs to wash/dry using figure 4 technique or get assist from family member PRN, pt verbalized understanding of this. Recommend Min guard - supervision assist for safety secondary to back precautions. Pt has necessary DME. Pain is limiting factor this date as pt states that she requested "pain medication over night and never got any". Pt and daughter are visibly upset and pt rates pain as 10/10 which is a limiting factor at this time. Pt was overall close supervision for toilet transfer and functional mobility in room using rollator. She declined any further acute OT at this time until pain is better managed. Supportive family to assist at d/c.    Recommendations for follow up therapy are one component of a multi-disciplinary discharge planning process, led by the attending physician.  Recommendations may be updated based on patient status, additional functional criteria and insurance authorization.    Follow Up Recommendations  Home health OT     Assistance Recommended at Discharge PRN  Patient can return home with the following  A little help with walking and/or transfers;A little help with  bathing/dressing/bathroom;Help with stairs or ramp for entrance;Assist for transportation;Assistance with cooking/housework   Equipment Recommendations  None recommended by OT (Pt has necessary DME, A/E)    Recommendations for Other Services      Precautions / Restrictions Precautions Precautions: Back;Fall Required Braces or Orthoses: Spinal Brace Spinal Brace: Applied in sitting position Restrictions Weight Bearing Restrictions: Yes Other Position/Activity Restrictions: WBAT       Mobility Bed Mobility Overal bed mobility:  (Pt received in bathroom and sitting at EOB at end of session. Pt states daughter to assist her back to bed after drinking Boost.)    Transfers Overall transfer level: Needs assistance Equipment used: Rollator (4 wheels) Transfers: Sit to/from Stand Sit to Stand: Supervision, Min guard   Step pivot transfers: Supervision, Min guard (Close supervision)   General transfer comment: Close supervision - no hands on assist this date using rollator     Balance Overall balance assessment: Needs assistance, Mild deficits observed, not formally tested Sitting-balance support: No upper extremity supported, Feet supported Sitting balance-Leahy Scale: Good   Standing balance support: Bilateral upper extremity supported, Reliant on assistive device for balance Standing balance-Leahy Scale: Fair Standing balance comment: Reliant on RW in standing - pt with nausea     ADL either performed or assessed with clinical judgement   ADL Overall ADL's : Needs assistance/impaired   Toilet Transfer: Supervision/safety;Ambulation;Rollator (4 wheels);BSC/3in1 (3:1 over toilet in bathroom) Toilet Transfer Details (indicate cue type and reason): Pt received sitting on toilet in bathroom today. Gave pt rollator to use and she ambulated from toilet to EOB with close supervision. Pain is limiting factor at this time  Toileting- Clothing Manipulation and Hygiene: Cueing for back  precautions;Sit to/from stand;Supervision/safety   Tub/ Shower Transfer: Walk-in shower;Supervision/safety;Ambulation;Rollator (4 wheels);Grab bars Tub/Shower Transfer Details (indicate cue type and reason): Simulated via toilet transfer in bathroom. Verbal discussion on shower transfers and use of rollator - pt with level entry shower, grab bars, hand held shower head and shower chair. Discussed back precautions and recommendation to get assist to wash back, cross legs to wash/dry using figure 4 technique or get assist from family member PRN, pt verbalized understanding of this. Functional mobility during ADLs: Supervision/safety;Cueing for safety;Rollator (4 wheels);Min guard General ADL Comments: Pt received in bathroom sitting on 3:1 over toilet in pt bathroom. Pt was seen for OT ADL retraining session today with focus on toilet transfer and shower transfer. Verbal discussion on shower transfers and use of rollator - pt with level entry shower, grab bars, hand held shower head and shower chair. Discussed back precautions during shower and recommendation to get assist to wash back, cross legs to wash/dry using figure 4 technique or get assist from family member PRN, pt verbalized understanding of this. Recommend Min guard - supervision assist for safety secondary to back precautions. Pt has necessary DME. Pain is limiting factor this date as pt states that she requested "pain medication over night and never got any". Pt and daughter are visibly upset and pt rates pain as 10/10 which is a limiting factor at this time. Pt was overall close supervision for toilet transfer and functional mobility in room using rollator. She declined any further acute OT at this time until pain is better managed. Supportive family to assist at d/c.    Extremity/Trunk Assessment Upper Extremity Assessment Upper Extremity Assessment: Overall WFL for tasks assessed   Lower Extremity Assessment Lower Extremity Assessment:  Defer to PT evaluation   Cervical / Trunk Assessment Cervical / Trunk Assessment: Back Surgery    Vision Baseline Vision/History: 1 Wears glasses Patient Visual Report: No change from baseline Vision Assessment?: No apparent visual deficits          Cognition Arousal/Alertness: Awake/alert Behavior During Therapy: WFL for tasks assessed/performed Overall Cognitive Status: Within Functional Limits for tasks assessed   General Comments: pt nauseous from pain - stating that she requested pain medication over night and was not given any. She does note that she also becomes nauseous with pain meds (she has to manage this at home as well) but very internally foocused on 10/10 pain              General Comments VSS. Pt received smaller back brace which was applied in sitting at EOB.    Pertinent Vitals/ Pain       Pain Assessment Pain Assessment: 0-10 Pain Score: 10-Worst pain ever Pain Location: low back and into LLE Pain Descriptors / Indicators: Radiating, Sharp, Aching, Grimacing Pain Intervention(s): Limited activity within patient's tolerance, Monitored during session, Patient requesting pain meds-RN notified  Home Living Family/patient expects to be discharged to:: Private residence Living Arrangements: Spouse/significant other (spouse had surgery on 07/08/22) Available Help at Discharge: Family;Available 24 hours/day Type of Home: House Home Access: Stairs to enter [employer] of Steps: 4-5 Entrance Stairs-Rails: Right;Left;Can reach both Home Layout: One level   Bathroom Shower/Tub: Occupational psychologist: Handicapped height Bathroom Accessibility: Yes   Home Equipment: BSC/3in1;Shower seat - built in;Grab bars - toilet;Grab bars - tub/shower;Rollator (4 wheels);Rolling Walker (2 wheels);Hand held shower head;Adaptive equipment;Other (comment)   Additional Comments: pt lives with her spouse  whom had surgery on 07/08/22, will have assist from her  daughter until 1/28 and then niece for the next week and family is talking about what help they can get for household task assistance after that      Prior Functioning/Environment   Please refer to initial OT Eval for details. Pt lives with spouse whom recently had surgery as well. PTA, pt was Min A for homemaking, ADL's (washing her back and LB bathe/dress). Pt daughter and niece to provide 24/7 PRN assist at d/c.   Frequency  Min 2X/week        Progress Toward Goals  OT Goals(current goals can now be found in the care plan section)  Progress towards OT goals: Progressing toward goals  Acute Rehab OT Goals Patient Stated Goal: Get pain and nausea under control OT Goal Formulation: With patient Time For Goal Achievement: 07/23/22 Potential to Achieve Goals: Good  Plan Discharge plan remains appropriate       AM-PAC OT "6 Clicks" Daily Activity     Outcome Measure   Help from another person eating meals?: None Help from another person taking care of personal grooming?: A Little Help from another person toileting, which includes using toliet, bedpan, or urinal?: A Little Help from another person bathing (including washing, rinsing, drying)?: A Little Help from another person to put on and taking off regular upper body clothing?: A Little Help from another person to put on and taking off regular lower body clothing?: A Little 6 Click Score: 19    End of Session Equipment Utilized During Treatment: Rollator (4 wheels);Back brace  OT Visit Diagnosis: Unsteadiness on feet (R26.81);Pain Pain - Right/Left:  (Low back) Pain - part of body:  (Back pain)   Activity Tolerance Patient limited by pain;Patient tolerated treatment well (Pt had not had pain meds)   Patient Left in bed;Other (comment) (Sitting up at EOB, drinking Boost, daughter in room)   Nurse Communication Mobility status;Other (comment) (N & V, pt family upset - requesting number for Patient Experience)         Time: 0812-0832 OT Time Calculation (min): 20 min  Charges: OT General Charges $OT Visit: 1 Visit OT Treatments $Self Care/Home Management : 8-22 mins   Kijana Cromie Beth Dixon, OTR/L 07/10/2022, 9:15 AM

## 2022-07-10 NOTE — Progress Notes (Signed)
Physical Therapy Treatment Patient Details Name: Mckenzie Lawrence MRN: [id number] DOB: [birth year] Today's Date: 07/10/2022   History of Present Illness Pt is a 77 y/o female presenting on 07/08/22 for L3-4 PLIF with redo of L4-5 and L5-S1 PLIF that was done in 4/23. PMH includes: cataracts, COPD, CVA, DM, arthritis, HTN, orthostatic hypotension, prior back surgery, bil shoulder surgery.    PT Comments    Pt was seen for further therapy after being to tired and nauseated to move earlier.  Doing better with a good awareness of spinal precautions and progression to walk on the hallway without exacerbating GI symptoms.  Pt is motivated and requires only occasional cues for safety with direction and turns, and was assisted to chair to sit up supported next to bed.  Daughter is in to supervise her, and nursing aware pt is up.  Recommending HHPT for her mobility to continue, with family support and education ongoing to protect her spinal condition.  Follow acutely for goals of PT in POC.  Recommendations for follow up therapy are one component of a multi-disciplinary discharge planning process, led by the attending physician.  Recommendations may be updated based on patient status, additional functional criteria and insurance authorization.  Follow Up Recommendations  Home health PT     Assistance Recommended at Discharge Intermittent Supervision/Assistance  Patient can return home with the following A little help with walking and/or transfers;A little help with bathing/dressing/bathroom;Assistance with cooking/housework;Assist for transportation;Help with stairs or ramp for entrance   Equipment Recommendations  None recommended by PT    Recommendations for Other Services       Precautions / Restrictions Precautions Precautions: Back;Fall Precaution Booklet Issued: Yes (comment) Required Braces or Orthoses: Spinal Brace Spinal Brace: Applied in sitting position Restrictions Weight Bearing  Restrictions: No     Mobility  Bed Mobility Overal bed mobility: Needs Assistance Bed Mobility: Sidelying to Sit, Rolling Rolling: Min guard Sidelying to sit: Min assist       General bed mobility comments: pt is able to assist to push off R elbow to sit up    Transfers Overall transfer level: Needs assistance Equipment used: Rollator (4 wheels) Transfers: Sit to/from Stand Sit to Stand: Min guard   Step pivot transfers: Min guard       General transfer comment: requires reminders for reducing speed and controlling safety with direction and use of walker to do all standing balancing    Ambulation/Gait Ambulation/Gait assistance: Min guard Gait Distance (Feet): 150 Feet Assistive device: Rolling walker (2 wheels) Gait Pattern/deviations: Step-through pattern, Decreased stride length, Wide base of support Gait velocity: reduced Gait velocity interpretation: <1.31 ft/sec, indicative of household ambulator Pre-gait activities: standing balance and posture ck General Gait Details: no upset stomach with walking distance after donning new back brace   Stairs             Wheelchair Mobility    Modified Rankin (Stroke Patients Only)       Balance Overall balance assessment: Needs assistance Sitting-balance support: Feet supported Sitting balance-Leahy Scale: Good       Standing balance-Leahy Scale: Fair                              Cognition Arousal/Alertness: Awake/alert Behavior During Therapy: WFL for tasks assessed/performed Overall Cognitive Status: Within Functional Limits for tasks assessed  General Comments: nauseated from pain earlier but now is progressed and more tolerant of moving        Exercises      General Comments General comments (skin integrity, edema, etc.): pt was able to walk on bari rollator with min guard for safety but did remind her about directions to turn to sit  and to avoid longer back up walks      Pertinent Vitals/Pain Pain Assessment Pain Assessment: Faces Faces Pain Scale: Hurts a little bit Pain Location: low back Pain Descriptors / Indicators: Guarding Pain Intervention(s): Limited activity within patient's tolerance, Monitored during session, Premedicated before session, Repositioned    Home Living                          Prior Function            PT Goals (current goals can now be found in the care plan section) Acute Rehab PT Goals Patient Stated Goal: to get home Progress towards PT goals: Progressing toward goals    Frequency    Min 5X/week      PT Plan Current plan remains appropriate    Co-evaluation              AM-PAC PT "6 Clicks" Mobility   Outcome Measure  Help needed turning from your back to your side while in a flat bed without using bedrails?: A Little Help needed moving from lying on your back to sitting on the side of a flat bed without using bedrails?: A Little Help needed moving to and from a bed to a chair (including a wheelchair)?: A Little Help needed standing up from a chair using your arms (e.g., wheelchair or bedside chair)?: A Little Help needed to walk in hospital room?: A Little Help needed climbing 3-5 steps with a railing? : A Little 6 Click Score: 18    End of Session Equipment Utilized During Treatment: Gait belt;Back brace Activity Tolerance: Treatment limited secondary to medical complications (Comment) Patient left: with call bell/phone within reach;with family/visitor present;in chair Nurse Communication: Mobility status PT Visit Diagnosis: Unsteadiness on feet (R26.81);Pain;Difficulty in walking, not elsewhere classified (R26.2) Pain - Right/Left: Left (back) Pain - part of body: Leg (back)     Time: 1705-1730 PT Time Calculation (min) (ACUTE ONLY): 25 min  Charges:  $Gait Training: 8-22 mins $Therapeutic Activity: 8-22 mins Ramond Dial 07/10/2022,  6:25 PM  Mee Hives, PT PhD Acute Rehab Dept. Number: [place] and [place]

## 2022-07-10 NOTE — Progress Notes (Signed)
Pt refused to take morning medications this morning d/t nausea. Pt didn't have IV site, Zofran Po and Oxy 10 mg given able to tolerate. Pt requested to take her medications at 1800 because she usually takes it around that time at home and is able to tolerate it better. Medications rescheduled by pharmacy. Medications given at 1800.

## 2022-07-10 NOTE — Progress Notes (Signed)
PT Cancellation Note  Patient Details Name: Mckenzie Lawrence MRN: [id number] DOB: 1946/01/04   Cancelled Treatment:    Reason Eval/Treat Not Completed: Other (comment).  Refused therapy, reports nausea and unable to move presently.  Retry as time and pt allow.   Ramond Dial 07/10/2022, 2:39 PM  Mee Hives, PT PhD Acute Rehab Dept. Number: [place] and [place]

## 2022-07-10 NOTE — Plan of Care (Signed)

## 2022-07-10 NOTE — Progress Notes (Addendum)
Subjective: The patient complains of back pain and right leg pain.  He says the Flexeril makes her too sleepy.  She has been urinating normally.  Her daughter is at the bedside.  She requests scheduled Tylenol which works better for her.  Objective: Vital signs in last 24 hours: Temp:  [98 F (36.7 C)-98.4 F (36.9 C)] 98.4 F (36.9 C) (01/24 0722) Pulse Rate:  [72-83] 83 (01/24 0722) Resp:  [16] 16 (01/23 1956) BP: (139-156)/(50-57) 156/53 (01/24 0722) SpO2:  [96 %-99 %] 97 % (01/24 0722) Estimated body mass index is 26.31 kg/m as calculated from the following:   Height as of this encounter: '5\' 7"'$  (1.702 m).   Weight as of this encounter: 76.2 kg.   Intake/Output from previous day: 01/23 0701 - 01/24 0700 In: 3 [I.V.:3] Out: -  Intake/Output this shift: No intake/output data recorded.  Physical exam the patient is alert and pleasant.  Her dressing is clean and dry.  Her strength is normal.  Lab Results: Recent Labs    07/08/22 1420  WBC 11.4*  HGB 11.0*  HCT 34.4*  PLT 369   BMET Recent Labs    07/08/22 1420 07/09/22 1037  NA 128* 131*  K 4.1 4.6  CL 96* 97*  CO2 21* 25  GLUCOSE 139* 99  BUN 29* 30*  CREATININE 1.91* 2.10*  CALCIUM 9.1 8.6*    Studies/Results: DG Lumbar Spine 2-3 Views  Result Date: 07/09/2022 CLINICAL DATA:  Fluoro guidance provided EXAM: LUMBAR SPINE - 2-3 VIEW; DG C-ARM 1-60 MIN-NO REPORT FINDINGS: Dose: 9.7 mGy Fluoro time 10s IMPRESSION: C-arm fluoro guidance provided. Electronically Signed   By: Sammie Bench M.D.   On: 07/09/2022 08:01   DG C-Arm 1-60 Min-No Report  Result Date: 07/08/2022 Fluoroscopy was utilized by the requesting physician.  No radiographic interpretation.    Assessment/Plan: Postop day #2: I will add low-dose Decadron(she is diabetic) for inflammation and Neurontin for her leg pain.  Hopefully we can get her home tomorrow.  LOS: 2 days     [name] 07/10/2022, 7:49 AM     Patient ID: Mckenzie Lawrence, female   DOB: 12/19/1945, 77 y.o.   MRN: [id number]

## 2022-07-11 LAB — GLUCOSE, CAPILLARY
Glucose-Capillary: 117 mg/dL — ABNORMAL HIGH (ref 70–99)
Glucose-Capillary: 131 mg/dL — ABNORMAL HIGH (ref 70–99)
Glucose-Capillary: 149 mg/dL — ABNORMAL HIGH (ref 70–99)
Glucose-Capillary: 187 mg/dL — ABNORMAL HIGH (ref 70–99)

## 2022-07-11 MED ORDER — TIZANIDINE HCL 2 MG PO TABS: 2.0000 mg | ORAL_TABLET | Freq: Three times a day (TID) | ORAL | 1 refills | Status: DC

## 2022-07-11 MED ORDER — ONDANSETRON HCL 4 MG PO TABS: 4.0000 mg | ORAL_TABLET | Freq: Four times a day (QID) | ORAL | 0 refills | Status: DC | PRN

## 2022-07-11 MED ORDER — OXYCODONE HCL 5 MG PO TABS: 5.0000 mg | ORAL_TABLET | ORAL | 0 refills | Status: DC | PRN

## 2022-07-11 MED ORDER — GABAPENTIN 100 MG PO CAPS: 100.0000 mg | ORAL_CAPSULE | Freq: Three times a day (TID) | ORAL | 0 refills | Status: DC

## 2022-07-11 NOTE — Discharge Instructions (Signed)
Wound Care Keep incision covered and dry until you are at home.  Do not put any creams, lotions, or ointments on incision. Leave steri-strips on back.  They will fall off by themselves. You are fine to shower. Let water run over incision and pat dry.  Activity Walk each and every day, increasing distance each day. No lifting greater than 5 lbs.  Avoid excessive back motion. No driving for 2 weeks; may ride as a passenger locally.  Diet Resume your normal diet.   Return to Work Will be discussed at your follow up appointment.  Call Your Doctor If Any of These Occur Redness, drainage, or swelling at the wound.  Temperature greater than 101 degrees. Severe pain not relieved by pain medication. Incision starts to come apart.  Follow Up Appt Call [phone] today for appointment in 3-4 weeks if you don't already have one or for any problems.  If you have any hardware placed in your spine, you will need an x-ray before your appointment.

## 2022-07-11 NOTE — Progress Notes (Signed)
Discharge instructions given. Patient verbalized understanding and all questions were answered.  ?

## 2022-07-11 NOTE — Discharge Summary (Signed)
Physician Discharge Summary     Providing Compassionate, Quality Care - Together   Patient ID: Mckenzie Lawrence MRN: [id number] DOB/AGE: 07-31-1945 77 y.o.  Admit date: 07/08/2022 Discharge date: 07/11/2022  Admission Diagnoses: Recurrent displacement of lumbar disc  Discharge Diagnoses:  Principal Problem:   S/P lumbar spinal fusion Active Problems:   Recurrent displacement of lumbar disc   Discharged Condition: good  Hospital Course: Patient underwent an L3-4 PLIF by Dr. Arnoldo Morale on 07/08/2022. She was admitted to [facility] following recovery from anesthesia in the PACU. Her postoperative course has been complicated with pain control. She has worked with both physical and occupational therapies who feel the patient is ready for discharge home with home health therapies. She is ambulating independently and without difficulty. She is tolerating a normal diet. She is not having any bowel or bladder dysfunction. Her pain is reasonably controlled with oral pain medication. She is ready for discharge home.   Consults: PT/OT,TOC  Significant Diagnostic Studies: radiology: DG Lumbar Spine 2-3 Views  Result Date: 07/09/2022 CLINICAL DATA:  Fluoro guidance provided EXAM: LUMBAR SPINE - 2-3 VIEW; DG C-ARM 1-60 MIN-NO REPORT FINDINGS: Dose: 9.7 mGy Fluoro time 10s IMPRESSION: C-arm fluoro guidance provided. Electronically Signed   By: Sammie Bench M.D.   On: 07/09/2022 08:01   DG C-Arm 1-60 Min-No Report  Result Date: 07/08/2022 Fluoroscopy was utilized by the requesting physician.  No radiographic interpretation.     Treatments: surgery: Bilateral L3-4 laminotomy/foraminotomies/medial facetectomy to decompress the bilateral L3 and L4 nerve roots(the work required to do this was in addition to the work required to do the posterior lumbar interbody fusion because of the patient's recurrent herniated disc, epidural scar tissue,, facet arthropathy. Etc. requiring a wide decompression of the nerve  roots.);  Left L3-4 transforaminal lumbar interbody fusion with local morselized autograft bone and Zimmer DBM; insertion of interbody prosthesis at L3-4 (globus peek expandable interbody prosthesis); posterior segmental instrumentation from L3 to S1 with globus titanium pedicle screws and rods; posterior lateral arthrodesis at L3-4 and redo posterolateral arthrodesis at L4-5 and L5-S1 with local morselized autograft bone and Zimmer DBM; exploration of lumbar fusion/removal of lumbar hardware.   Discharge Exam: Blood pressure (!) 150/59, pulse 64, temperature 98.1 F (36.7 C), temperature source Oral, resp. rate 14, height '5\' 7"'$  (1.702 m), weight 76.2 kg, SpO2 99 %.  Alert and oriented x 4 PERRLA CN II-XII grossly intact MAE, Strength and sensation intact Incision redressed with gauze dressing; Incision is clean, dry, and intact, covered with Steri Strips   Disposition: Discharge disposition: 06-Home-Health Care Svc       Discharge Instructions     Call MD for:  difficulty breathing, headache or visual disturbances   Complete by: As directed    Call MD for:  hives   Complete by: As directed    Call MD for:  persistant nausea and vomiting   Complete by: As directed    Call MD for:  redness, tenderness, or signs of infection (pain, swelling, redness, odor or green/yellow discharge around incision site)   Complete by: As directed    Call MD for:  severe uncontrolled pain   Complete by: As directed    Call MD for:  temperature >100.4   Complete by: As directed    Diet - low sodium heart healthy   Complete by: As directed    Increase activity slowly   Complete by: As directed    No dressing needed   Complete by: As directed  No wound care   Complete by: As directed       Allergies as of 07/11/2022       Reactions   Iodine Anaphylaxis, Other (See Comments)   Pt states that she is allergic to ingested iodine only, okay for betadine.     Shellfish Allergy Anaphylaxis    Codeine Nausea And Vomiting   Morphine Sulfate Nausea And Vomiting   Irbesartan Other (See Comments)    Unknown  (Avapro)   Sulfa Antibiotics Other (See Comments)   Fever         Medication List     STOP taking these medications    oxyCODONE-acetaminophen 5-325 MG tablet Commonly known as: PERCOCET/ROXICET   promethazine 25 MG tablet Commonly known as: PHENERGAN       TAKE these medications    acetaminophen 500 MG tablet Commonly known as: TYLENOL Take 1,000 mg by mouth every 6 (six) hours as needed for moderate pain.   aspirin EC 81 MG tablet Take 81 mg by mouth at bedtime. Swallow whole.   BD Insulin Syringe U/F 31G X 5/16" 1 ML Misc Generic drug: Insulin Syringe-Needle U-100 USE 1 SYRINGE AS DIRECTED   calcitRIOL 0.25 MCG capsule Commonly known as: ROCALTROL Take 0.25 mcg by mouth at bedtime.   clopidogrel 75 MG tablet Commonly known as: PLAVIX Take 75 mg by mouth at bedtime.   D-MANNOSE PO Take 2 tablets by mouth at bedtime.   diazepam 10 MG tablet Commonly known as: VALIUM Take 10 mg by mouth every 6 (six) hours as needed for anxiety.   diphenhydrAMINE 25 MG tablet Commonly known as: BENADRYL Take 50 mg by mouth at bedtime as needed for sleep.   docusate sodium 100 MG capsule Commonly known as: COLACE Take 1 capsule (100 mg total) by mouth 2 (two) times daily. What changed: when to take this   estradiol 0.1 MG/GM vaginal cream Commonly known as: ESTRACE Place 1 Applicatorful vaginally 2 (two) times a week.   feeding supplement Liqd Take 1 Container by mouth daily.   gabapentin 100 MG capsule Commonly known as: Neurontin Take 1 capsule (100 mg total) by mouth 3 (three) times daily. Start with 1 capsule at bedtime and advance to 3 capsules as tolerated.   Gemtesa 75 MG Tabs Generic drug: Vibegron Take 75 mg by mouth at bedtime.   insulin detemir 100 UNIT/ML injection Commonly known as: LEVEMIR Inject 0.3-0.6 mLs (30-60 Units total) into  the skin daily. What changed:  how much to take additional instructions   losartan 100 MG tablet Commonly known as: COZAAR Take 1 tablet (100 mg total) by mouth daily. What changed: when to take this   metoprolol succinate 100 MG 24 hr tablet Commonly known as: TOPROL-XL Take 100 mg by mouth at bedtime.   nitrofurantoin (macrocrystal-monohydrate) 100 MG capsule Commonly known as: MACROBID Take 100 mg by mouth at bedtime.   ondansetron 4 MG tablet Commonly known as: ZOFRAN Take 1 tablet (4 mg total) by mouth every 6 (six) hours as needed for nausea or vomiting. What changed: when to take this   OneTouch Ultra test strip Generic drug: glucose blood 4 (four) times daily.   oxyCODONE 5 MG immediate release tablet Commonly known as: Oxy IR/ROXICODONE Take 1-2 tablets (5-10 mg total) by mouth every 4 (four) hours as needed for moderate pain ((score 4 to 6)).   pantoprazole 40 MG tablet Commonly known as: PROTONIX Take 40 mg by mouth 2 (two) times daily as needed (heartburn).  PRESERVISION AREDS 2+MULTI VIT PO Take 1 capsule by mouth 2 (two) times daily.   sennosides-docusate sodium 8.6-50 MG tablet Commonly known as: SENOKOT-S Take 1 tablet by mouth at bedtime.   sucralfate 1 g tablet Commonly known as: CARAFATE Take 1 g by mouth 2 (two) times daily as needed (acid reflux).   tiZANidine 2 MG tablet Commonly known as: ZANAFLEX Take 1 tablet (2 mg total) by mouth 3 (three) times daily.   zolpidem 10 MG tablet Commonly known as: AMBIEN Take 10 mg by mouth at bedtime as needed for sleep.               Discharge Care Instructions  (From admission, onward)           Start     Ordered   07/11/22 0000  No dressing needed        07/11/22 1236            Follow-up Information     Maryland Pink, MD Follow up.   Specialty: Family Medicine Contact information: [address] [facility] [place] [address] [phone]         [facility] Follow up.   Why: Resumption of home health PT and OT service will be provided by [facility], start of care within 48 hours post discharge Contact information: [place] [address] [phone]         [facility] Follow up.   Contact information: [address] [place], [place] [address] Phone: [phone]        Newman Pies, MD. Go on 07/26/2022.   Specialty: Neurosurgery Why: First post op appointment is on 07/26/2022 at 12:00 PM. Contact information: [address] [place] [address] [phone]                 Signed: Viona Gilmore, DNP, AGNP-C Nurse Practitioner  [facility] [place] [address], [address], [place], [place] [address] P: [phone]    F: [phone]  07/11/2022, 12:36 PM

## 2022-07-13 DIAGNOSIS — M5126 Other intervertebral disc displacement, lumbar region: Secondary | ICD-10-CM | POA: Diagnosis not present

## 2022-07-18 DIAGNOSIS — E1122 Type 2 diabetes mellitus with diabetic chronic kidney disease: Secondary | ICD-10-CM | POA: Diagnosis not present

## 2022-07-18 DIAGNOSIS — M1990 Unspecified osteoarthritis, unspecified site: Secondary | ICD-10-CM | POA: Diagnosis not present

## 2022-07-18 DIAGNOSIS — Z4789 Encounter for other orthopedic aftercare: Secondary | ICD-10-CM | POA: Diagnosis not present

## 2022-07-18 DIAGNOSIS — I2510 Atherosclerotic heart disease of native coronary artery without angina pectoris: Secondary | ICD-10-CM | POA: Diagnosis not present

## 2022-07-18 DIAGNOSIS — Z794 Long term (current) use of insulin: Secondary | ICD-10-CM | POA: Diagnosis not present

## 2022-07-18 DIAGNOSIS — Z981 Arthrodesis status: Secondary | ICD-10-CM | POA: Diagnosis not present

## 2022-07-18 DIAGNOSIS — N184 Chronic kidney disease, stage 4 (severe): Secondary | ICD-10-CM | POA: Diagnosis not present

## 2022-07-18 DIAGNOSIS — I129 Hypertensive chronic kidney disease with stage 1 through stage 4 chronic kidney disease, or unspecified chronic kidney disease: Secondary | ICD-10-CM | POA: Diagnosis not present

## 2022-07-18 DIAGNOSIS — D649 Anemia, unspecified: Secondary | ICD-10-CM | POA: Diagnosis not present

## 2022-07-18 DIAGNOSIS — J449 Chronic obstructive pulmonary disease, unspecified: Secondary | ICD-10-CM | POA: Diagnosis not present

## 2022-07-19 MED FILL — Sodium Chloride IV Soln 0.9%: INTRAVENOUS | Qty: 1000 | Status: AC

## 2022-07-19 MED FILL — Heparin Sodium (Porcine) Inj 1000 Unit/ML: INTRAMUSCULAR | Qty: 30 | Status: AC

## 2022-07-25 DIAGNOSIS — M5126 Other intervertebral disc displacement, lumbar region: Secondary | ICD-10-CM | POA: Diagnosis not present

## 2022-07-25 DIAGNOSIS — Z981 Arthrodesis status: Secondary | ICD-10-CM | POA: Diagnosis not present

## 2022-07-26 ENCOUNTER — Other Ambulatory Visit: Payer: Self-pay | Admitting: Gastroenterology

## 2022-07-26 DIAGNOSIS — M5126 Other intervertebral disc displacement, lumbar region: Secondary | ICD-10-CM | POA: Diagnosis not present

## 2022-07-26 DIAGNOSIS — R1310 Dysphagia, unspecified: Secondary | ICD-10-CM | POA: Diagnosis not present

## 2022-07-26 DIAGNOSIS — M5136 Other intervertebral disc degeneration, lumbar region: Secondary | ICD-10-CM | POA: Diagnosis not present

## 2022-07-29 DIAGNOSIS — Z981 Arthrodesis status: Secondary | ICD-10-CM | POA: Diagnosis not present

## 2022-07-29 DIAGNOSIS — M5126 Other intervertebral disc displacement, lumbar region: Secondary | ICD-10-CM | POA: Diagnosis not present

## 2022-07-30 DIAGNOSIS — D485 Neoplasm of uncertain behavior of skin: Secondary | ICD-10-CM | POA: Diagnosis not present

## 2022-07-30 DIAGNOSIS — R209 Unspecified disturbances of skin sensation: Secondary | ICD-10-CM | POA: Diagnosis not present

## 2022-07-30 DIAGNOSIS — B078 Other viral warts: Secondary | ICD-10-CM | POA: Diagnosis not present

## 2022-07-30 DIAGNOSIS — L281 Prurigo nodularis: Secondary | ICD-10-CM | POA: Diagnosis not present

## 2022-08-01 DIAGNOSIS — M5126 Other intervertebral disc displacement, lumbar region: Secondary | ICD-10-CM | POA: Diagnosis not present

## 2022-08-01 DIAGNOSIS — Z981 Arthrodesis status: Secondary | ICD-10-CM | POA: Diagnosis not present

## 2022-08-06 ENCOUNTER — Other Ambulatory Visit: Payer: Self-pay | Admitting: Neurosurgery

## 2022-08-07 ENCOUNTER — Other Ambulatory Visit: Payer: Self-pay | Admitting: Neurosurgery

## 2022-08-07 NOTE — Progress Notes (Signed)
Patient is requesting Dr Annye Asa for her anesthesia care.  PCP - Dr Maryland Pink Cardiologist - Isaias Cowman, MD  Neurology - Dr Jennings Books Nephrology - Dr Anthonette Legato   CT Chest x-ray - 01/14/22 EKG - 11/09/21 Stress Test - 12/06/11 ECHO - 03/16/15 Cardiac Cath - greater than 20 yrs ago  ICD Pacemaker/Loop - n/a  Sleep Study -  Yes CPAP - uses nightly  Diabetes Type 2  THE MORNING OF SURGERY, take 1/2 of your normal insulin dose.     If your blood sugar is less than 70 mg/dL on DOS, you will need to treat for low blood sugar:  Treat a low blood sugar (less than 70 mg/dL) with  cup of clear juice (cranberry or apple), 4 glucose tablets, OR glucose gel. Recheck blood sugar in 15 minutes after treatment (to make sure it is greater than 70 mg/dL). If your blood sugar is not greater than 70 mg/dL on recheck, call [phone] for further instructions.  Blood Thinner Instructions:  Last dose was on 08/05/22.  Aspirin Instructions: Last dose was on 08/05/22.  NPO ERAS  Anesthesia review: Yes  STOP now taking any Aspirin (unless otherwise instructed by your surgeon), Aleve, Naproxen, Ibuprofen, Motrin, Advil, Goody's, BC's, all herbal medications, fish oil, and all vitamins.   Coronavirus Screening Do you have any of the following symptoms:  Cough yes/no: No Fever (>100.18F)  yes/no: No Runny nose yes/no: No Sore throat yes/no: No Difficulty breathing/shortness of breath  yes/no: No  Have you traveled in the last 14 days and where? yes/no: No  Patient verbalized understanding of instructions that were given via phone.

## 2022-08-07 NOTE — Anesthesia Preprocedure Evaluation (Signed)
Anesthesia Evaluation  Patient identified by MRN, date of birth, ID band Patient awake    Reviewed: Allergy & Precautions, H&P , NPO status , Patient's Chart, lab work & pertinent test results, reviewed documented beta blocker date and time   History of Anesthesia Complications (+) PONV and history of anesthetic complications  Airway Mallampati: II  TM Distance: >3 FB Neck ROM: Full    Dental no notable dental hx. (+) Teeth Intact, Dental Advisory Given   Pulmonary sleep apnea and Continuous Positive Airway Pressure Ventilation , COPD, Current SmokerPatient did not abstain from smoking.   Pulmonary exam normal breath sounds clear to auscultation       Cardiovascular hypertension, Pt. on medications and Pt. on home beta blockers + CAD   Rhythm:Regular Rate:Normal     Neuro/Psych   Anxiety     negative neurological ROS     GI/Hepatic Neg liver ROS,GERD  Medicated,,  Endo/Other  diabetes, Insulin Dependent    Renal/GU negative Renal ROS  negative genitourinary   Musculoskeletal  (+) Arthritis ,    Abdominal   Peds  Hematology  (+) Blood dyscrasia, anemia   Anesthesia Other Findings   Reproductive/Obstetrics negative OB ROS                             Anesthesia Physical Anesthesia Plan  ASA: 3  Anesthesia Plan: General   Post-op Pain Management: Tylenol PO (pre-op)*   Induction: Intravenous  PONV Risk Score and Plan: 4 or greater and Ondansetron, Dexamethasone and Treatment may vary due to age or medical condition  Airway Management Planned: Oral ETT  Additional Equipment:   Intra-op Plan:   Post-operative Plan: Extubation in OR  Informed Consent: I have reviewed the patients History and Physical, chart, labs and discussed the procedure including the risks, benefits and alternatives for the proposed anesthesia with the patient or authorized representative who has indicated  his/her understanding and acceptance.     Dental advisory given  Plan Discussed with: CRNA  Anesthesia Plan Comments: (PAT note written 08/07/2022 by Myra Gianotti, PA-C.  )       Anesthesia Quick Evaluation

## 2022-08-07 NOTE — Progress Notes (Signed)
Anesthesia Chart Review:  Case: [id number] Date/Time: 08/08/22 1442   Procedure: I&D OF LUMBAR WOUND - 3C   Anesthesia type: General   Pre-op diagnosis: SURGICAL WOUND INFECTION   Location: [facility] OR ROOM 25 / [facility] OR   Surgeons: Newman Pies, MD       DISCUSSION: Patient is a 77 year old female scheduled for the above procedure. She is s/p lumbar surgery on 05/20/22 and 07/08/22 and is now scheduled for I&D of surgical wound infection on 08/08/22. She is also scheduled for endoscopy with Dr. Clarene Essex on 08/12/22.  History includes smoking, COPD, HTN (and hypotension), HLD, DM2, OSA (uses CPAP), CKD (IIIB), CVA (02/2015), PCOS, spinal surgery (L4-5 microdiscectomy 05/11/18; L4-S1 laminotomy/foraminotomy/PLIF, L4-S1 posterolateral arthrodesis PLIF 4//23, L34- microdiscectomy 10/01/21; Left L3-4 redo intervertebral discectomy 05/20/22; L3-4 TLIF, posterior instrumentation L3-S1, L3-4 lateral arthrodesis, redo posterolateral arthrodesis L4-S1 07/08/22), varicose veins (s/p right GSV ablation), hyponatremia (likely due to SIADH).    She had a prolonged hospitalization after 10/01/21 lumbar fusion. She was not discharged until 10/11/21. Hospital course complicated by AKI on CKD, ABL s/p 2 units PRBC. She was denied CIRC placement and declined SNF level rehab. She told PAT RN staff that she had hallucination and kidney failure post-operatively and believes she was "overdosed" with anesthesia. She was then readmitted 5/26/235/29/23 for acute pancreatitis felt due to medication like Cipro and/or Crestor. She does not use alcohol.  - Appears to have had an unremarkable post-operatively hospitalization for 05/20/22 left L3-4 redo discectomy. She was discharged home with HHPT on POD#1. Following 07/08/22 surgery, "postoperative course has been complicated with pain control", but discharged with [facility] therapies on POD #3.Marland Kitchen    Last cardiology visit with Dr. Josefa Half was on 04/25/22. She was doing okay from a cardiac standpoint.  HTN controlled. Crestor on hold since May pancreatitis. No new cardiac testing ordered. 4 month follow-up planned. She was previously given clearance to hold ASA and Plavix for 7 days prior to April back surgery.  Last Plavix and ASA doses documented as 07/03/22.   Last PCP visit with Dr. Kary Kos was on 04/09/22 and nephrology follow-up with Dr. Holley Raring was on 12/25/21. Neurology classified her as moderate risk for her last surgery. Her Creatinine had ranged from ~ 1.8-2.2 since 09/2020-10/01/21. It peaked around 2.4 post-operatively on 10/03/21. Her Creatinine has been primarily ~ 1.7-2.0 since September 2023.   Last visit with pulmonologist Dr. Raul Del was on 12/20/21. COPD documented as "mild COPD, stage II". She was not yet ready to stop smoking. Continue CPAP at current settings.    She is a same day work-up, so she will get labs and further anesthesia team evaluation on the day of surgery. She had requested anesthesiologist Annye Asa, MD, but she is not scheduled on 08/08/22.  Last Plavix and aspirin 08/05/2022.    VS: Ht 5' 6.5" (1.689 m)   Wt 72.3 kg   BMI 25.34 kg/m  BP Readings from Last 3 Encounters:  07/11/22 (!) 150/59  05/21/22 (!) 163/53  01/22/22 (!) 165/69   Pulse Readings from Last 3 Encounters:  07/11/22 64  05/21/22 64  01/22/22 79     PROVIDERS: Maryland Pink, MD is PCP  Isaias Cowman, MD is cardiologist  Anthonette Legato, MD is nephrologist Wallene Huh, MD is pulmonologist Olean Ree, MD is GI Jennings Books, MD is neurologist   LABS: Most recent lab results in [facility] include: Lab Results  Component Value Date   WBC 11.4 (H) 07/08/2022   HGB 11.0 (L) 07/08/2022  HCT 34.4 (L) 07/08/2022   PLT 369 07/08/2022   GLUCOSE 99 07/09/2022   ALT 6 11/09/2021   AST 19 11/09/2021   NA 131 (L) 07/09/2022   K 4.6 07/09/2022   CL 97 (L) 07/09/2022   CREATININE 2.10 (H) 07/09/2022   BUN 30 (H) 07/09/2022   CO2 25 07/09/2022   HGBA1C 6.7 (H) 05/20/2022      EKG: 11/09/21:  Sinus rhythm with Premature atrial complexes Rightward axis Cannot rule out Anterior infarct , age undetermined Abnormal ECG When compared with ECG of 06-May-2018 14:50, Premature atrial complexes are now Present T wave amplitude has increased in Lateral leads     CV: Echo 08/06/18 ([facility]) INTERPRETATION  NORMAL LEFT VENTRICULAR SYSTOLIC FUNCTION   WITH MILD LVH  NORMAL RIGHT VENTRICULAR SYSTOLIC FUNCTION  TRIVIAL REGURGITATION NOTED (See above)  NO VALVULAR STENOSIS  TRIVIAL MR, TR  EF 60%      Per [facility] notes: - 72-hour Holter monitor (03/19/2021 - 03/22/2021) revealed predominant sinus rhythm with mean heart rate of 83 bpm, heart rate range 62 to 176 bpm, episodes of sinus tachycardia as well as occasional premature atrial contractions.     - CAD (coronary artery disease) 20% LAD, 40% RCA stenosis. Other vessels normal.  mild, 2001 noted to have 20% LAD, 40% RCA stenosis, other vessels normal      US Carotid 04/26/16: Impression: Doppler velocities suggest 1 to 39% bilateral proximal internal carotid artery stenoses.   ETT 12/06/11: Recommendations:  Negative adequate ETT.  No further testing is indicated.     Past Medical History:  Diagnosis Date   Anemia    Arthritis    Bladder incontinence    Broken foot    Cataracts, bilateral    Chronic kidney insufficiency    stage 3b   COPD (chronic obstructive pulmonary disease) (HCC)    wheezing   Coronary artery disease 04/25/2022   in [place]   COVID 2021   very mild case   CVA (cerebral vascular accident) ([place]) 2016   has had 3 strokes, states right side is slightlyweaker than left   Diabetes mellitus    insulin dependent, Type 2   GERD (gastroesophageal reflux disease)    HLD (hyperlipidemia)    Hx of cardiovascular stress test    a. ETT (6/13):  Ex 5:13; no ischemic changes   Hypertension    controlled on meds   Lacunar stroke of left subthalamic region [facility]) 02/2015   Leg  pain    left   Lower back pain    Neuromuscular disorder (HCC)    stroke right hand tingling   Orthostatic hypotension    Osteopenia 01/2017   T score -2.0 stable from prior DEXA   Pancreatitis 10/2021   PCOS (polycystic ovarian syndrome)    Personal history of tobacco use, presenting hazards to health 01/09/2015   PONV (postoperative nausea and vomiting)    Sleep apnea    uses CPAP nightly    Past Surgical History:  Procedure Laterality Date   BOTOX INJECTION  01/15/2022   Procedure: BOTOX INJECTION;  Surgeon: Clarene Essex, MD;  Location: [facility];  Service: Gastroenterology;;   Delphina Cahill LIFT Bilateral 11/04/2017   Procedure: BLEPHAROPLASTY UPPER EYELID W/EXCESS SKIN;  Surgeon: Karle Starch, MD;  Location: [facility];  Service: Ophthalmology;  Laterality: Bilateral;  DIABETES-insulin dependent uses CPAP   CARDIAC CATHETERIZATION  20 yrs ago   found nothing   CARPAL TUNNEL RELEASE Bilateral    CATARACT  EXTRACTION Bilateral    ELBOW SURGERY Bilateral    ESOPHAGOGASTRODUODENOSCOPY N/A 07/25/2021   Procedure: ESOPHAGOGASTRODUODENOSCOPY (EGD);  Surgeon: Toledo, Benay Pike, MD;  Location: [facility];  Service: Gastroenterology;  Laterality: N/A;  IDDM   ESOPHAGOGASTRODUODENOSCOPY N/A 01/15/2022   Procedure: ESOPHAGOGASTRODUODENOSCOPY (EGD);  Surgeon: Clarene Essex, MD;  Location: [facility];  Service: Gastroenterology;  Laterality: N/A;  botox   FOOT SURGERY     Groin Abscess     HAND SURGERY     KNEE SURGERY Bilateral    LABIAL ABSCESS     LUMBAR LAMINECTOMY/DECOMPRESSION MICRODISCECTOMY Left 05/11/2018   Procedure: LUMBAR LAMINECTOMY/DECOMPRESSION MICRODISCECTOMY 1 LEVEL- L4-5;  Surgeon: [name], MD;  Location: [facility];  Service: Neurosurgery;  Laterality: Left;   LUMBAR LAMINECTOMY/DECOMPRESSION MICRODISCECTOMY Left 05/20/2022   Procedure: MICRODISCECTOMY L3-4;  Surgeon: Newman Pies, MD;  Location: [facility];  Service: Neurosurgery;  Laterality: Left;  3C    OOPHORECTOMY     BSO   PUBO VAG SLING     SHOULDER SURGERY     bilateral arthroscopies   VAGINAL HYSTERECTOMY  1979    MEDICATIONS: No current facility-administered medications for this encounter.    acetaminophen (TYLENOL) 500 MG tablet   aspirin EC 81 MG tablet   calcitRIOL (ROCALTROL) 0.25 MCG capsule   clopidogrel (PLAVIX) 75 MG tablet   D-MANNOSE PO   diazepam (VALIUM) 10 MG tablet   diphenhydrAMINE (BENADRYL) 25 MG tablet   docusate sodium (COLACE) 100 MG capsule   estradiol (ESTRACE) 0.1 MG/GM vaginal cream   feeding supplement (BOOST HIGH PROTEIN) LIQD   gabapentin (NEURONTIN) 100 MG capsule   hydrOXYzine (ATARAX) 50 MG tablet   insulin detemir (LEVEMIR) 100 UNIT/ML injection   losartan (COZAAR) 100 MG tablet   metoprolol succinate (TOPROL-XL) 100 MG 24 hr tablet   Multiple Vitamins-Minerals (PRESERVISION AREDS 2+MULTI VIT PO)   nitrofurantoin, macrocrystal-monohydrate, (MACROBID) 100 MG capsule   ondansetron (ZOFRAN) 4 MG tablet   oxyCODONE (OXY IR/ROXICODONE) 5 MG immediate release tablet   pantoprazole (PROTONIX) 40 MG tablet   sennosides-docusate sodium (SENOKOT-S) 8.6-50 MG tablet   sucralfate (CARAFATE) 1 g tablet   tiZANidine (ZANAFLEX) 2 MG tablet   Vibegron (GEMTESA) 75 MG TABS   zolpidem (AMBIEN) 10 MG tablet   BD INSULIN SYRINGE U/F 31G X 5/16" 1 ML MISC   ONETOUCH ULTRA test strip    Myra Gianotti, PA-C Surgical Short Stay/Anesthesiology [facility] Phone [phone] [facility] Phone [phone] 08/07/2022 12:55 PM

## 2022-08-08 ENCOUNTER — Encounter (HOSPITAL_COMMUNITY): Payer: Self-pay | Admitting: Neurosurgery

## 2022-08-08 ENCOUNTER — Inpatient Hospital Stay (HOSPITAL_COMMUNITY): Payer: PPO | Admitting: Vascular Surgery

## 2022-08-08 ENCOUNTER — Inpatient Hospital Stay (HOSPITAL_COMMUNITY)
Admission: RE | Admit: 2022-08-08 | Discharge: 2022-08-13 | DRG: 863 | Disposition: A | Discharge status: Home Health | Payer: PPO | Attending: Neurosurgery | Admitting: Neurosurgery

## 2022-08-08 ENCOUNTER — Other Ambulatory Visit: Payer: Self-pay

## 2022-08-08 ENCOUNTER — Encounter (HOSPITAL_COMMUNITY)
Admission: RE | Disposition: A | Discharge status: Home Health | Payer: Self-pay | Source: Home / Self Care | Attending: Neurosurgery

## 2022-08-08 DIAGNOSIS — Z882 Allergy status to sulfonamides status: Secondary | ICD-10-CM

## 2022-08-08 DIAGNOSIS — Z90722 Acquired absence of ovaries, bilateral: Secondary | ICD-10-CM

## 2022-08-08 DIAGNOSIS — Z833 Family history of diabetes mellitus: Secondary | ICD-10-CM

## 2022-08-08 DIAGNOSIS — T8140XA Infection following a procedure, unspecified, initial encounter: Secondary | ICD-10-CM

## 2022-08-08 DIAGNOSIS — Z7982 Long term (current) use of aspirin: Secondary | ICD-10-CM

## 2022-08-08 DIAGNOSIS — T8189XD Other complications of procedures, not elsewhere classified, subsequent encounter: Principal | ICD-10-CM

## 2022-08-08 DIAGNOSIS — T8141XA Infection following a procedure, superficial incisional surgical site, initial encounter: Principal | ICD-10-CM | POA: Diagnosis present

## 2022-08-08 DIAGNOSIS — Z9842 Cataract extraction status, left eye: Secondary | ICD-10-CM

## 2022-08-08 DIAGNOSIS — Z79899 Other long term (current) drug therapy: Secondary | ICD-10-CM

## 2022-08-08 DIAGNOSIS — Z794 Long term (current) use of insulin: Secondary | ICD-10-CM | POA: Diagnosis not present

## 2022-08-08 DIAGNOSIS — J449 Chronic obstructive pulmonary disease, unspecified: Secondary | ICD-10-CM | POA: Diagnosis not present

## 2022-08-08 DIAGNOSIS — F419 Anxiety disorder, unspecified: Secondary | ICD-10-CM | POA: Diagnosis present

## 2022-08-08 DIAGNOSIS — Z8249 Family history of ischemic heart disease and other diseases of the circulatory system: Secondary | ICD-10-CM

## 2022-08-08 DIAGNOSIS — F17210 Nicotine dependence, cigarettes, uncomplicated: Secondary | ICD-10-CM | POA: Diagnosis not present

## 2022-08-08 DIAGNOSIS — I10 Essential (primary) hypertension: Secondary | ICD-10-CM

## 2022-08-08 DIAGNOSIS — T847XXD Infection and inflammatory reaction due to other internal orthopedic prosthetic devices, implants and grafts, subsequent encounter: Secondary | ICD-10-CM | POA: Diagnosis not present

## 2022-08-08 DIAGNOSIS — E282 Polycystic ovarian syndrome: Secondary | ICD-10-CM | POA: Diagnosis not present

## 2022-08-08 DIAGNOSIS — Z9079 Acquired absence of other genital organ(s): Secondary | ICD-10-CM | POA: Diagnosis not present

## 2022-08-08 DIAGNOSIS — E1122 Type 2 diabetes mellitus with diabetic chronic kidney disease: Secondary | ICD-10-CM | POA: Diagnosis not present

## 2022-08-08 DIAGNOSIS — Z9841 Cataract extraction status, right eye: Secondary | ICD-10-CM | POA: Diagnosis not present

## 2022-08-08 DIAGNOSIS — Z8673 Personal history of transient ischemic attack (TIA), and cerebral infarction without residual deficits: Secondary | ICD-10-CM

## 2022-08-08 DIAGNOSIS — T8142XA Infection following a procedure, deep incisional surgical site, initial encounter: Secondary | ICD-10-CM | POA: Diagnosis not present

## 2022-08-08 DIAGNOSIS — Z90710 Acquired absence of both cervix and uterus: Secondary | ICD-10-CM

## 2022-08-08 DIAGNOSIS — Z888 Allergy status to other drugs, medicaments and biological substances status: Secondary | ICD-10-CM | POA: Diagnosis not present

## 2022-08-08 DIAGNOSIS — I2510 Atherosclerotic heart disease of native coronary artery without angina pectoris: Secondary | ICD-10-CM | POA: Diagnosis present

## 2022-08-08 DIAGNOSIS — E1169 Type 2 diabetes mellitus with other specified complication: Secondary | ICD-10-CM

## 2022-08-08 DIAGNOSIS — Z8616 Personal history of COVID-19: Secondary | ICD-10-CM

## 2022-08-08 DIAGNOSIS — N184 Chronic kidney disease, stage 4 (severe): Secondary | ICD-10-CM

## 2022-08-08 DIAGNOSIS — E119 Type 2 diabetes mellitus without complications: Secondary | ICD-10-CM | POA: Diagnosis not present

## 2022-08-08 DIAGNOSIS — F17200 Nicotine dependence, unspecified, uncomplicated: Secondary | ICD-10-CM | POA: Diagnosis not present

## 2022-08-08 DIAGNOSIS — I129 Hypertensive chronic kidney disease with stage 1 through stage 4 chronic kidney disease, or unspecified chronic kidney disease: Secondary | ICD-10-CM | POA: Diagnosis present

## 2022-08-08 DIAGNOSIS — Y838 Other surgical procedures as the cause of abnormal reaction of the patient, or of later complication, without mention of misadventure at the time of the procedure: Secondary | ICD-10-CM | POA: Diagnosis present

## 2022-08-08 DIAGNOSIS — Z809 Family history of malignant neoplasm, unspecified: Secondary | ICD-10-CM

## 2022-08-08 DIAGNOSIS — Z91013 Allergy to seafood: Secondary | ICD-10-CM | POA: Diagnosis not present

## 2022-08-08 DIAGNOSIS — T847XXA Infection and inflammatory reaction due to other internal orthopedic prosthetic devices, implants and grafts, initial encounter: Secondary | ICD-10-CM | POA: Diagnosis not present

## 2022-08-08 DIAGNOSIS — E785 Hyperlipidemia, unspecified: Secondary | ICD-10-CM | POA: Diagnosis not present

## 2022-08-08 DIAGNOSIS — M8580 Other specified disorders of bone density and structure, unspecified site: Secondary | ICD-10-CM | POA: Diagnosis present

## 2022-08-08 DIAGNOSIS — M48061 Spinal stenosis, lumbar region without neurogenic claudication: Secondary | ICD-10-CM

## 2022-08-08 DIAGNOSIS — K219 Gastro-esophageal reflux disease without esophagitis: Secondary | ICD-10-CM | POA: Diagnosis present

## 2022-08-08 DIAGNOSIS — M1990 Unspecified osteoarthritis, unspecified site: Secondary | ICD-10-CM | POA: Diagnosis present

## 2022-08-08 DIAGNOSIS — B9562 Methicillin resistant Staphylococcus aureus infection as the cause of diseases classified elsewhere: Secondary | ICD-10-CM | POA: Diagnosis present

## 2022-08-08 DIAGNOSIS — Z885 Allergy status to narcotic agent status: Secondary | ICD-10-CM

## 2022-08-08 DIAGNOSIS — T8149XA Infection following a procedure, other surgical site, initial encounter: Secondary | ICD-10-CM | POA: Diagnosis not present

## 2022-08-08 DIAGNOSIS — G4733 Obstructive sleep apnea (adult) (pediatric): Secondary | ICD-10-CM | POA: Diagnosis present

## 2022-08-08 HISTORY — PX: LUMBAR WOUND DEBRIDEMENT: SHX1988

## 2022-08-08 LAB — COMPREHENSIVE METABOLIC PANEL
ALT: 11 U/L (ref 0–44)
AST: 19 U/L (ref 15–41)
Albumin: 2.3 g/dL — ABNORMAL LOW (ref 3.5–5.0)
Alkaline Phosphatase: 91 U/L (ref 38–126)
Anion gap: 10 (ref 5–15)
BUN: 31 mg/dL — ABNORMAL HIGH (ref 8–23)
CO2: 21 mmol/L — ABNORMAL LOW (ref 22–32)
Calcium: 8.9 mg/dL (ref 8.9–10.3)
Chloride: 96 mmol/L — ABNORMAL LOW (ref 98–111)
Creatinine, Ser: 1.98 mg/dL — ABNORMAL HIGH (ref 0.44–1.00)
GFR, Estimated: 26 mL/min — ABNORMAL LOW (ref >60)
Glucose, Bld: 148 mg/dL — ABNORMAL HIGH (ref 70–99)
Potassium: 3.9 mmol/L (ref 3.5–5.1)
Sodium: 127 mmol/L — ABNORMAL LOW (ref 135–145)
Total Bilirubin: 0.5 mg/dL (ref 0.3–1.2)
Total Protein: 6.2 g/dL — ABNORMAL LOW (ref 6.5–8.1)

## 2022-08-08 LAB — CBC
HCT: 27 % — ABNORMAL LOW (ref 36.0–46.0)
Hemoglobin: 9.1 g/dL — ABNORMAL LOW (ref 12.0–15.0)
MCH: 31 pg (ref 26.0–34.0)
MCHC: 33.7 g/dL (ref 30.0–36.0)
MCV: 91.8 fL (ref 80.0–100.0)
Platelets: 392 10*3/uL (ref 150–400)
RBC: 2.94 MIL/uL — ABNORMAL LOW (ref 3.87–5.11)
RDW: 14.2 % (ref 11.5–15.5)
WBC: 11.1 10*3/uL — ABNORMAL HIGH (ref 4.0–10.5)
nRBC: 0 % (ref 0.0–0.2)

## 2022-08-08 LAB — SURGICAL PCR SCREEN
MRSA, PCR: NEGATIVE
Staphylococcus aureus: POSITIVE — AB

## 2022-08-08 LAB — GLUCOSE, CAPILLARY
Glucose-Capillary: 159 mg/dL — ABNORMAL HIGH (ref 70–99)
Glucose-Capillary: 123 mg/dL — ABNORMAL HIGH (ref 70–99)
Glucose-Capillary: 100 mg/dL — ABNORMAL HIGH (ref 70–99)
Glucose-Capillary: 103 mg/dL — ABNORMAL HIGH (ref 70–99)
Glucose-Capillary: 114 mg/dL — ABNORMAL HIGH (ref 70–99)

## 2022-08-08 SURGERY — LUMBAR WOUND DEBRIDEMENT
Anesthesia: General

## 2022-08-08 MED ORDER — ACETAMINOPHEN 650 MG RE SUPP: 650.0000 mg | RECTAL | Status: DC | PRN

## 2022-08-08 MED ORDER — SODIUM CHLORIDE 0.9 % IV SOLN
250.0000 mL | INTRAVENOUS | Status: DC
  Administered 2022-08-08: 250 mL via INTRAVENOUS

## 2022-08-08 MED ORDER — ONDANSETRON HCL 4 MG/2ML IJ SOLN
INTRAMUSCULAR | Status: AC
  Filled 2022-08-08: qty 2

## 2022-08-08 MED ORDER — TIZANIDINE HCL 2 MG PO TABS: 2.0000 mg | ORAL_TABLET | Freq: Four times a day (QID) | ORAL | Status: DC | PRN

## 2022-08-08 MED ORDER — MORPHINE SULFATE (PF) 4 MG/ML IV SOLN
INTRAVENOUS | Status: AC
  Filled 2022-08-08: qty 1

## 2022-08-08 MED ORDER — MIDAZOLAM HCL 2 MG/2ML IJ SOLN
INTRAMUSCULAR | Status: AC
  Filled 2022-08-08: qty 2

## 2022-08-08 MED ORDER — SUCRALFATE 1 G PO TABS
1.0000 g | ORAL_TABLET | Freq: Two times a day (BID) | ORAL | Status: DC | PRN
  Administered 2022-08-13: 1 g via ORAL
  Filled 2022-08-08 (×3): qty 1

## 2022-08-08 MED ORDER — FENTANYL CITRATE (PF) 250 MCG/5ML IJ SOLN
INTRAMUSCULAR | Status: DC | PRN
  Administered 2022-08-08: 50 ug via INTRAVENOUS

## 2022-08-08 MED ORDER — CEFAZOLIN SODIUM-DEXTROSE 2-4 GM/100ML-% IV SOLN
2.0000 g | INTRAVENOUS | Status: AC
  Administered 2022-08-08: 2 g via INTRAVENOUS
  Filled 2022-08-08: qty 100

## 2022-08-08 MED ORDER — PROPOFOL 10 MG/ML IV BOLUS
INTRAVENOUS | Status: AC
  Filled 2022-08-08: qty 20

## 2022-08-08 MED ORDER — DIAZEPAM 5 MG PO TABS
10.0000 mg | ORAL_TABLET | Freq: Four times a day (QID) | ORAL | Status: DC | PRN
  Administered 2022-08-10 (×2): 10 mg via ORAL
  Filled 2022-08-08 (×2): qty 2

## 2022-08-08 MED ORDER — ONDANSETRON HCL 4 MG/2ML IJ SOLN
4.0000 mg | Freq: Four times a day (QID) | INTRAMUSCULAR | Status: DC | PRN
  Administered 2022-08-09 – 2022-08-13 (×11): 4 mg via INTRAVENOUS
  Filled 2022-08-08 (×11): qty 2

## 2022-08-08 MED ORDER — INSULIN ASPART 100 UNIT/ML IJ SOLN
0.0000 [IU] | INTRAMUSCULAR | Status: DC
  Administered 2022-08-09 (×2): 4 [IU] via SUBCUTANEOUS
  Administered 2022-08-10: 7 [IU] via SUBCUTANEOUS
  Administered 2022-08-11: 2 [IU] via SUBCUTANEOUS
  Administered 2022-08-12 – 2022-08-13 (×2): 7 [IU] via SUBCUTANEOUS
  Administered 2022-08-13 (×2): 3 [IU] via SUBCUTANEOUS

## 2022-08-08 MED ORDER — MENTHOL 3 MG MT LOZG: 1.0000 | LOZENGE | OROMUCOSAL | Status: DC | PRN

## 2022-08-08 MED ORDER — SODIUM CHLORIDE 0.9 % IV SOLN: INTRAVENOUS | Status: DC

## 2022-08-08 MED ORDER — ROCURONIUM BROMIDE 10 MG/ML (PF) SYRINGE
PREFILLED_SYRINGE | INTRAVENOUS | Status: AC
  Filled 2022-08-08: qty 10

## 2022-08-08 MED ORDER — SUGAMMADEX SODIUM 200 MG/2ML IV SOLN
INTRAVENOUS | Status: DC | PRN
  Administered 2022-08-08: 200 mg via INTRAVENOUS

## 2022-08-08 MED ORDER — BACITRACIN ZINC 500 UNIT/GM EX OINT
TOPICAL_OINTMENT | CUTANEOUS | Status: DC | PRN
  Administered 2022-08-08: 1 via TOPICAL

## 2022-08-08 MED ORDER — CYCLOBENZAPRINE HCL 10 MG PO TABS
10.0000 mg | ORAL_TABLET | Freq: Three times a day (TID) | ORAL | Status: DC | PRN
  Administered 2022-08-08: 10 mg via ORAL
  Filled 2022-08-08 (×2): qty 1

## 2022-08-08 MED ORDER — BACITRACIN ZINC 500 UNIT/GM EX OINT
TOPICAL_OINTMENT | CUTANEOUS | Status: AC
  Filled 2022-08-08: qty 28.35

## 2022-08-08 MED ORDER — FENTANYL CITRATE (PF) 100 MCG/2ML IJ SOLN
INTRAMUSCULAR | Status: AC
  Filled 2022-08-08: qty 2

## 2022-08-08 MED ORDER — LIDOCAINE 2% (20 MG/ML) 5 ML SYRINGE
INTRAMUSCULAR | Status: DC | PRN
  Administered 2022-08-08: 60 mg via INTRAVENOUS

## 2022-08-08 MED ORDER — MORPHINE SULFATE (PF) 4 MG/ML IV SOLN
4.0000 mg | INTRAVENOUS | Status: DC | PRN
  Administered 2022-08-08 – 2022-08-13 (×2): 4 mg via INTRAVENOUS
  Filled 2022-08-08: qty 1

## 2022-08-08 MED ORDER — PHENOL 1.4 % MT LIQD: 1.0000 | OROMUCOSAL | Status: DC | PRN

## 2022-08-08 MED ORDER — PIPERACILLIN-TAZOBACTAM 3.375 G IVPB
3.3750 g | Freq: Three times a day (TID) | INTRAVENOUS | Status: DC
  Administered 2022-08-08 – 2022-08-09 (×2): 3.375 g via INTRAVENOUS
  Filled 2022-08-08 (×2): qty 50

## 2022-08-08 MED ORDER — ONDANSETRON HCL 4 MG PO TABS: 4.0000 mg | ORAL_TABLET | Freq: Four times a day (QID) | ORAL | Status: DC | PRN

## 2022-08-08 MED ORDER — VANCOMYCIN HCL 500 MG IV SOLR
INTRAVENOUS | Status: DC | PRN
  Administered 2022-08-08: 1000 mg via TOPICAL

## 2022-08-08 MED ORDER — 0.9 % SODIUM CHLORIDE (POUR BTL) OPTIME
TOPICAL | Status: DC | PRN
  Administered 2022-08-08: 1000 mL

## 2022-08-08 MED ORDER — PHENYLEPHRINE 80 MCG/ML (10ML) SYRINGE FOR IV PUSH (FOR BLOOD PRESSURE SUPPORT)
PREFILLED_SYRINGE | INTRAVENOUS | Status: DC | PRN
  Administered 2022-08-08 (×2): 80 ug via INTRAVENOUS

## 2022-08-08 MED ORDER — SODIUM CHLORIDE 0.9% FLUSH
3.0000 mL | Freq: Two times a day (BID) | INTRAVENOUS | Status: DC
  Administered 2022-08-09 – 2022-08-13 (×7): 3 mL via INTRAVENOUS

## 2022-08-08 MED ORDER — OXYCODONE HCL 5 MG PO TABS
5.0000 mg | ORAL_TABLET | ORAL | Status: DC | PRN
  Administered 2022-08-09 – 2022-08-13 (×4): 5 mg via ORAL
  Filled 2022-08-08 (×4): qty 1

## 2022-08-08 MED ORDER — ROCURONIUM BROMIDE 10 MG/ML (PF) SYRINGE
PREFILLED_SYRINGE | INTRAVENOUS | Status: DC | PRN
  Administered 2022-08-08: 50 mg via INTRAVENOUS

## 2022-08-08 MED ORDER — THROMBIN 5000 UNITS EX SOLR
CUTANEOUS | Status: AC
  Filled 2022-08-08: qty 5000

## 2022-08-08 MED ORDER — FENTANYL CITRATE (PF) 250 MCG/5ML IJ SOLN
INTRAMUSCULAR | Status: AC
  Filled 2022-08-08: qty 5

## 2022-08-08 MED ORDER — DOCUSATE SODIUM 100 MG PO CAPS
100.0000 mg | ORAL_CAPSULE | Freq: Two times a day (BID) | ORAL | Status: DC
  Administered 2022-08-08 – 2022-08-12 (×9): 100 mg via ORAL
  Filled 2022-08-08 (×10): qty 1

## 2022-08-08 MED ORDER — VANCOMYCIN HCL 1000 MG IV SOLR
INTRAVENOUS | Status: AC
  Filled 2022-08-08: qty 20

## 2022-08-08 MED ORDER — THROMBIN 5000 UNITS EX SOLR
OROMUCOSAL | Status: DC | PRN
  Administered 2022-08-08: 5 mL via TOPICAL

## 2022-08-08 MED ORDER — FENTANYL CITRATE (PF) 100 MCG/2ML IJ SOLN
25.0000 ug | INTRAMUSCULAR | Status: DC | PRN
  Administered 2022-08-08 (×2): 25 ug via INTRAVENOUS
  Administered 2022-08-08: 50 ug via INTRAVENOUS
  Administered 2022-08-08: 25 ug via INTRAVENOUS
  Administered 2022-08-08: 50 ug via INTRAVENOUS

## 2022-08-08 MED ORDER — CHLORHEXIDINE GLUCONATE 0.12 % MT SOLN
OROMUCOSAL | Status: AC
  Administered 2022-08-08: 15 mL via OROMUCOSAL
  Filled 2022-08-08: qty 15

## 2022-08-08 MED ORDER — LOSARTAN POTASSIUM 50 MG PO TABS
100.0000 mg | ORAL_TABLET | Freq: Every day | ORAL | Status: DC
  Administered 2022-08-08 – 2022-08-13 (×6): 100 mg via ORAL
  Filled 2022-08-08 (×6): qty 2

## 2022-08-08 MED ORDER — ZOLPIDEM TARTRATE 5 MG PO TABS
5.0000 mg | ORAL_TABLET | Freq: Every evening | ORAL | Status: DC | PRN
  Administered 2022-08-11 – 2022-08-12 (×2): 5 mg via ORAL
  Filled 2022-08-08 (×2): qty 1

## 2022-08-08 MED ORDER — PHENYLEPHRINE 80 MCG/ML (10ML) SYRINGE FOR IV PUSH (FOR BLOOD PRESSURE SUPPORT)
PREFILLED_SYRINGE | INTRAVENOUS | Status: AC
  Filled 2022-08-08: qty 10

## 2022-08-08 MED ORDER — PANTOPRAZOLE SODIUM 40 MG PO TBEC
40.0000 mg | DELAYED_RELEASE_TABLET | Freq: Two times a day (BID) | ORAL | Status: DC
  Administered 2022-08-08 – 2022-08-13 (×10): 40 mg via ORAL
  Filled 2022-08-08 (×10): qty 1

## 2022-08-08 MED ORDER — BISACODYL 10 MG RE SUPP: 10.0000 mg | Freq: Every day | RECTAL | Status: DC | PRN

## 2022-08-08 MED ORDER — ACETAMINOPHEN 500 MG PO TABS
1000.0000 mg | ORAL_TABLET | Freq: Four times a day (QID) | ORAL | Status: AC
  Administered 2022-08-08 – 2022-08-09 (×2): 1000 mg via ORAL
  Filled 2022-08-08 (×3): qty 2

## 2022-08-08 MED ORDER — SODIUM CHLORIDE 0.9% FLUSH
3.0000 mL | INTRAVENOUS | Status: DC | PRN
  Administered 2022-08-12: 3 mL via INTRAVENOUS

## 2022-08-08 MED ORDER — ORAL CARE MOUTH RINSE: 15.0000 mL | Freq: Once | OROMUCOSAL | Status: AC

## 2022-08-08 MED ORDER — PROPOFOL 10 MG/ML IV BOLUS
INTRAVENOUS | Status: DC | PRN
  Administered 2022-08-08: 110 mg via INTRAVENOUS
  Administered 2022-08-08: 20 ug/kg/min via INTRAVENOUS

## 2022-08-08 MED ORDER — OXYCODONE HCL 5 MG PO TABS
10.0000 mg | ORAL_TABLET | ORAL | Status: DC | PRN
  Administered 2022-08-08 – 2022-08-13 (×9): 10 mg via ORAL
  Filled 2022-08-08 (×10): qty 2

## 2022-08-08 MED ORDER — CHLORHEXIDINE GLUCONATE CLOTH 2 % EX PADS: 6.0000 | MEDICATED_PAD | Freq: Once | CUTANEOUS | Status: DC

## 2022-08-08 MED ORDER — ACETAMINOPHEN 325 MG PO TABS
650.0000 mg | ORAL_TABLET | ORAL | Status: DC | PRN
  Administered 2022-08-09 – 2022-08-13 (×9): 650 mg via ORAL
  Filled 2022-08-08 (×10): qty 2

## 2022-08-08 MED ORDER — VANCOMYCIN HCL 1250 MG/250ML IV SOLN
1250.0000 mg | INTRAVENOUS | Status: DC
  Administered 2022-08-08: 1250 mg via INTRAVENOUS
  Filled 2022-08-08: qty 250

## 2022-08-08 MED ORDER — GABAPENTIN 100 MG PO CAPS
100.0000 mg | ORAL_CAPSULE | Freq: Three times a day (TID) | ORAL | Status: DC | PRN
  Administered 2022-08-11: 100 mg via ORAL
  Filled 2022-08-08: qty 1

## 2022-08-08 MED ORDER — MIDAZOLAM HCL 2 MG/2ML IJ SOLN
INTRAMUSCULAR | Status: DC | PRN
  Administered 2022-08-08: 2 mg via INTRAVENOUS

## 2022-08-08 MED ORDER — CHLORHEXIDINE GLUCONATE 0.12 % MT SOLN
15.0000 mL | Freq: Once | OROMUCOSAL | Status: AC
  Filled 2022-08-08: qty 15

## 2022-08-08 MED ORDER — METOPROLOL SUCCINATE ER 50 MG PO TB24
100.0000 mg | ORAL_TABLET | Freq: Every day | ORAL | Status: DC
  Administered 2022-08-08 – 2022-08-12 (×5): 100 mg via ORAL
  Filled 2022-08-08 (×5): qty 2

## 2022-08-08 MED ORDER — CALCITRIOL 0.25 MCG PO CAPS
0.2500 ug | ORAL_CAPSULE | Freq: Every day | ORAL | Status: DC
  Administered 2022-08-08 – 2022-08-12 (×5): 0.25 ug via ORAL
  Filled 2022-08-08 (×6): qty 1

## 2022-08-08 MED ORDER — MUPIROCIN 2 % EX OINT
TOPICAL_OINTMENT | CUTANEOUS | Status: AC
  Filled 2022-08-08: qty 22

## 2022-08-08 MED ORDER — ONDANSETRON HCL 4 MG/2ML IJ SOLN
INTRAMUSCULAR | Status: DC | PRN
  Administered 2022-08-08: 4 mg via INTRAVENOUS

## 2022-08-08 MED ORDER — LIDOCAINE 2% (20 MG/ML) 5 ML SYRINGE
INTRAMUSCULAR | Status: AC
  Filled 2022-08-08: qty 5

## 2022-08-08 MED ORDER — INSULIN ASPART 100 UNIT/ML IJ SOLN: 0.0000 [IU] | INTRAMUSCULAR | Status: DC | PRN

## 2022-08-08 MED ORDER — ACETAMINOPHEN 500 MG PO TABS
1000.0000 mg | ORAL_TABLET | Freq: Once | ORAL | Status: AC
  Administered 2022-08-08: 1000 mg via ORAL
  Filled 2022-08-08: qty 2

## 2022-08-08 SURGICAL SUPPLY — 39 items
APL SKNCLS STERI-STRIP NONHPOA (GAUZE/BANDAGES/DRESSINGS)
BAG COUNTER SPONGE SURGICOUNT (BAG) ×2 IMPLANT
BAG SPNG CNTER NS LX DISP (BAG) ×1
BENZOIN TINCTURE PRP APPL 2/3 (GAUZE/BANDAGES/DRESSINGS) ×2 IMPLANT
BLADE CLIPPER SURG (BLADE) IMPLANT
CANISTER SUCT 3000ML PPV (MISCELLANEOUS) ×2 IMPLANT
DRAPE LAPAROTOMY 100X72X124 (DRAPES) ×2 IMPLANT
DRAPE SURG 17X23 STRL (DRAPES) ×8 IMPLANT
DRSG OPSITE POSTOP 4X6 (GAUZE/BANDAGES/DRESSINGS) IMPLANT
ELECT REM PT RETURN 9FT ADLT (ELECTROSURGICAL) ×1
ELECTRODE REM PT RTRN 9FT ADLT (ELECTROSURGICAL) ×2 IMPLANT
GAUZE 4X4 16PLY ~~LOC~~+RFID DBL (SPONGE) IMPLANT
GAUZE SPONGE 4X4 12PLY STRL (GAUZE/BANDAGES/DRESSINGS) ×2 IMPLANT
GLOVE BIO SURGEON STRL SZ 6.5 (GLOVE) IMPLANT
GLOVE BIO SURGEON STRL SZ8 (GLOVE) ×2 IMPLANT
GLOVE BIO SURGEON STRL SZ8.5 (GLOVE) ×2 IMPLANT
GLOVE BIOGEL PI IND STRL 6.5 (GLOVE) IMPLANT
GLOVE EXAM NITRILE XL STR (GLOVE) IMPLANT
GOWN STRL REUS W/ TWL LRG LVL3 (GOWN DISPOSABLE) IMPLANT
GOWN STRL REUS W/ TWL XL LVL3 (GOWN DISPOSABLE) IMPLANT
GOWN STRL REUS W/TWL LRG LVL3 (GOWN DISPOSABLE)
GOWN STRL REUS W/TWL XL LVL3 (GOWN DISPOSABLE) ×2
HEMOSTAT POWDER KIT SURGIFOAM (HEMOSTASIS) IMPLANT
KIT BASIN OR (CUSTOM PROCEDURE TRAY) ×2 IMPLANT
KIT TURNOVER KIT B (KITS) ×2 IMPLANT
NEEDLE HYPO 22GX1.5 SAFETY (NEEDLE) IMPLANT
NS IRRIG 1000ML POUR BTL (IV SOLUTION) ×2 IMPLANT
PACK LAMINECTOMY NEURO (CUSTOM PROCEDURE TRAY) ×2 IMPLANT
PAD ARMBOARD 7.5X6 YLW CONV (MISCELLANEOUS) ×6 IMPLANT
STRIP CLOSURE SKIN 1/2X4 (GAUZE/BANDAGES/DRESSINGS) ×2 IMPLANT
SUT ETHILON 2 0 PSLX (SUTURE) IMPLANT
SUT VIC AB 1 CT1 18XBRD ANBCTR (SUTURE) ×2 IMPLANT
SUT VIC AB 1 CT1 8-18 (SUTURE) ×1
SUT VIC AB 2-0 CP2 18 (SUTURE) ×2 IMPLANT
SWAB COLLECTION DEVICE MRSA (MISCELLANEOUS) IMPLANT
SWAB CULTURE ESWAB REG 1ML (MISCELLANEOUS) IMPLANT
TOWEL GREEN STERILE (TOWEL DISPOSABLE) ×2 IMPLANT
TOWEL GREEN STERILE FF (TOWEL DISPOSABLE) ×2 IMPLANT
WATER STERILE IRR 1000ML POUR (IV SOLUTION) ×2 IMPLANT

## 2022-08-08 NOTE — Anesthesia Procedure Notes (Signed)
Procedure Name: Intubation Date/Time: 08/08/2022 3:53 PM  Performed by: Ester Rink, CRNAPre-anesthesia Checklist: Patient identified, Emergency Drugs available, Suction available and Patient being monitored Patient Re-evaluated:Patient Re-evaluated prior to induction Oxygen Delivery Method: Circle system utilized Preoxygenation: Pre-oxygenation with 100% oxygen Induction Type: IV induction Ventilation: Mask ventilation without difficulty Laryngoscope Size: Mac and 4 Grade View: Grade I Tube type: Oral Tube size: 7.0 mm Number of attempts: 1 Airway Equipment and Method: Stylet and Oral airway Placement Confirmation: ETT inserted through vocal cords under direct vision, positive ETCO2 and breath sounds checked- equal and bilateral Secured at: 22 cm Tube secured with: Tape Dental Injury: Teeth and Oropharynx as per pre-operative assessment

## 2022-08-08 NOTE — H&P (Signed)
Subjective: The patient is a 77 year old white female on whom I performed multiple back surgeries.  The most recent was an extension of her lumbar fusion about a month ago.  She has developed drainage from the wound last weekend.  She was started on empiric Keflex.  I recommended incision and drainage.  She has decided proceed with surgery.  Past Medical History:  Diagnosis Date   Anemia    Arthritis    Bladder incontinence    Broken foot    Cataracts, bilateral    Chronic kidney insufficiency    stage 3b   COPD (chronic obstructive pulmonary disease) (HCC)    wheezing   Coronary artery disease 04/25/2022   in [place]   COVID 2021   very mild case   CVA (cerebral vascular accident) ([place]) 2016   has had 3 strokes, states right side is slightlyweaker than left   Diabetes mellitus    insulin dependent, Type 2   GERD (gastroesophageal reflux disease)    HLD (hyperlipidemia)    Hx of cardiovascular stress test    a. ETT (6/13):  Ex 5:13; no ischemic changes   Hypertension    controlled on meds   Lacunar stroke of left subthalamic region [facility]) 02/2015   Leg pain    left   Lower back pain    Neuromuscular disorder (HCC)    stroke right hand tingling   Orthostatic hypotension    Osteopenia 01/2017   T score -2.0 stable from prior DEXA   Pancreatitis 10/2021   PCOS (polycystic ovarian syndrome)    Personal history of tobacco use, presenting hazards to health 01/09/2015   PONV (postoperative nausea and vomiting)    Sleep apnea    uses CPAP nightly    Past Surgical History:  Procedure Laterality Date   BOTOX INJECTION  01/15/2022   Procedure: BOTOX INJECTION;  Surgeon: Clarene Essex, MD;  Location: [facility];  Service: Gastroenterology;;   Delphina Cahill LIFT Bilateral 11/04/2017   Procedure: BLEPHAROPLASTY UPPER EYELID W/EXCESS SKIN;  Surgeon: Karle Starch, MD;  Location: [place];  Service: Ophthalmology;  Laterality: Bilateral;  DIABETES-insulin dependent uses CPAP   CARDIAC  CATHETERIZATION  20 yrs ago   found nothing   CARPAL TUNNEL RELEASE Bilateral    CATARACT EXTRACTION Bilateral    ELBOW SURGERY Bilateral    ESOPHAGOGASTRODUODENOSCOPY N/A 07/25/2021   Procedure: ESOPHAGOGASTRODUODENOSCOPY (EGD);  Surgeon: Toledo, Benay Pike, MD;  Location: [facility];  Service: Gastroenterology;  Laterality: N/A;  IDDM   ESOPHAGOGASTRODUODENOSCOPY N/A 01/15/2022   Procedure: ESOPHAGOGASTRODUODENOSCOPY (EGD);  Surgeon: Clarene Essex, MD;  Location: [facility];  Service: Gastroenterology;  Laterality: N/A;  botox   FOOT SURGERY     Groin Abscess     HAND SURGERY     KNEE SURGERY Bilateral    LABIAL ABSCESS     LUMBAR LAMINECTOMY/DECOMPRESSION MICRODISCECTOMY Left 05/11/2018   Procedure: LUMBAR LAMINECTOMY/DECOMPRESSION MICRODISCECTOMY 1 LEVEL- L4-5;  Surgeon: [name], MD;  Location: [facility];  Service: Neurosurgery;  Laterality: Left;   LUMBAR LAMINECTOMY/DECOMPRESSION MICRODISCECTOMY Left 05/20/2022   Procedure: MICRODISCECTOMY L3-4;  Surgeon: Newman Pies, MD;  Location: [place];  Service: Neurosurgery;  Laterality: Left;  3C   OOPHORECTOMY     BSO   PUBO VAG SLING     SHOULDER SURGERY     bilateral arthroscopies   VAGINAL HYSTERECTOMY  1979    Allergies  Allergen Reactions   Iodine Anaphylaxis and Other (See Comments)    Pt states that she is allergic to  ingested iodine only, okay for betadine.     Shellfish Allergy Anaphylaxis   Codeine Nausea And Vomiting   Morphine Sulfate Nausea And Vomiting   Irbesartan Other (See Comments)     Unknown  (Avapro)   Sulfa Antibiotics Other (See Comments)    Fever     Social History   Tobacco Use   Smoking status: Every Day    Packs/day: 1.50    Years: 50.00    Total pack years: 75.00    Types: Cigarettes   Smokeless tobacco: Never   Tobacco comments:    3ppd x 10 year, then cut back to 1.5pdd since 02/2015  Substance Use Topics   Alcohol use: Never    Alcohol/week: 0.0 standard drinks of alcohol    Family  History  Problem Relation Age of Onset   Hypertension Mother    Heart disease Mother 20       MI   Diabetes Father    Diabetes Sister    Hypertension Sister    Diabetes Brother    Hypertension Brother    Heart disease Brother 65       CAD   Cancer Sister        Multiple myloma   Diabetes Brother    Prior to Admission medications   Medication Sig Start Date End Date Taking? Authorizing Provider  acetaminophen (TYLENOL) 500 MG tablet Take 1,000 mg by mouth every 6 (six) hours as needed for moderate pain.   Yes [provider]  aspirin EC 81 MG tablet Take 81 mg by mouth at bedtime. Swallow whole.   Yes [provider]  calcitRIOL (ROCALTROL) 0.25 MCG capsule Take 0.25 mcg by mouth at bedtime. 08/17/21  Yes [provider]  clopidogrel (PLAVIX) 75 MG tablet Take 75 mg by mouth at bedtime.   Yes [provider]  D-MANNOSE PO Take 2 tablets by mouth at bedtime.   Yes [provider]  diphenhydrAMINE (BENADRYL) 25 MG tablet Take 50 mg by mouth at bedtime as needed for sleep.   Yes [provider]  docusate sodium (COLACE) 100 MG capsule Take 1 capsule (100 mg total) by mouth 2 (two) times daily. Patient taking differently: Take 100 mg by mouth at bedtime as needed for mild constipation or moderate constipation. 05/21/22  Yes Newman Pies, MD  estradiol (ESTRACE) 0.1 MG/GM vaginal cream Place 1 Applicatorful vaginally 2 (two) times a week. 11/23/21  Yes [provider]  feeding supplement (BOOST HIGH PROTEIN) LIQD Take 1 Container by mouth 2 (two) times daily between meals.   Yes [provider]  gabapentin (NEURONTIN) 100 MG capsule Take 1 capsule (100 mg total) by mouth 3 (three) times daily. Start with 1 capsule at bedtime and advance to 3 capsules as tolerated. Patient taking differently: Take 100 mg by mouth daily as needed (pain). 07/11/22 07/11/23 Yes Viona Gilmore D, NP  hydrOXYzine (ATARAX) 50 MG tablet Take 50 mg  by mouth at bedtime.   Yes [provider]  insulin detemir (LEVEMIR) 100 UNIT/ML injection Inject 0.3-0.6 mLs (30-60 Units total) into the skin daily. Patient taking differently: Inject 15 Units into the skin in the morning. 11/12/21  Yes Wieting, Richard, MD  losartan (COZAAR) 100 MG tablet Take 1 tablet (100 mg total) by mouth daily. Patient taking differently: Take 100 mg by mouth at bedtime. 09/26/15  Yes Gladstone Lighter, MD  metoprolol succinate (TOPROL-XL) 100 MG 24 hr tablet Take 100 mg by mouth at bedtime.   Yes  [provider]  Multiple Vitamins-Minerals (PRESERVISION AREDS 2+MULTI VIT PO) Take 1 capsule by mouth daily.   Yes [provider]  nitrofurantoin, macrocrystal-monohydrate, (MACROBID) 100 MG capsule Take 100 mg by mouth at bedtime.   Yes [provider]  ondansetron (ZOFRAN) 4 MG tablet Take 1 tablet (4 mg total) by mouth every 6 (six) hours as needed for nausea or vomiting. 07/11/22  Yes Viona Gilmore D, NP  oxyCODONE (OXY IR/ROXICODONE) 5 MG immediate release tablet Take 1-2 tablets (5-10 mg total) by mouth every 4 (four) hours as needed for moderate pain ((score 4 to 6)). 07/11/22  Yes Viona Gilmore D, NP  pantoprazole (PROTONIX) 40 MG tablet Take 40 mg by mouth 2 (two) times daily. 07/06/21  Yes [provider]  sennosides-docusate sodium (SENOKOT-S) 8.6-50 MG tablet Take 1 tablet by mouth daily as needed for constipation.   Yes [provider]  sucralfate (CARAFATE) 1 g tablet Take 1 g by mouth 2 (two) times daily as needed (acid reflux).   Yes [provider]  tiZANidine (ZANAFLEX) 2 MG tablet Take 1 tablet (2 mg total) by mouth 3 (three) times daily. Patient taking differently: Take 2 mg by mouth daily as needed for muscle spasms. 07/11/22  Yes Bergman, Trinda Pascal D, NP  Vibegron (GEMTESA) 75 MG TABS Take 75 mg by mouth at bedtime.   Yes [provider]  zolpidem (AMBIEN) 10 MG tablet Take 10 mg by mouth  at bedtime as needed for sleep. 12/20/21  Yes [provider]  BD INSULIN SYRINGE U/F 31G X 5/16" 1 ML MISC USE 1 SYRINGE AS DIRECTED 07/10/20   [provider]  diazepam (VALIUM) 10 MG tablet Take 10 mg by mouth every 6 (six) hours as needed for anxiety.    [provider]  [facility] ULTRA test strip 4 (four) times daily. 07/11/20   [provider]     Review of Systems  Positive ROS: As above  All other systems have been reviewed and were otherwise negative with the exception of those mentioned in the HPI and as above.  Objective: Vital signs in last 24 hours: Temp:  [97.8 F (36.6 C)] 97.8 F (36.6 C) (02/22 1232) Pulse Rate:  [80] 80 (02/22 1232) Resp:  [18] 18 (02/22 1232) BP: (170)/(71) 170/71 (02/22 1232) SpO2:  [100 %] 100 % (02/22 1232) Weight:  [70.8 kg] 70.8 kg (02/22 1232) Estimated body mass index is 24.8 kg/m as calculated from the following:   Height as of this encounter: 5' 6.5" (1.689 m).   Weight as of this encounter: 70.8 kg.   General Appearance: Alert Head: Normocephalic, without obvious abnormality, atraumatic Eyes: PERRL, conjunctiva/corneas clear, EOM's intact,    Ears: Normal  Throat: Normal  Neck: Supple, Back: Her lumbar incision is purulent drainage from the upper aspect and subcutaneous fluctuance. Lungs: Clear to auscultation bilaterally, respirations unlabored Heart: Regular rate and rhythm, no murmur, rub or gallop Abdomen: Soft, non-tender Extremities: Extremities normal, atraumatic, no cyanosis or edema Skin: unremarkable  NEUROLOGIC:   Mental status: alert and oriented,Motor Exam - grossly normal Sensory Exam - grossly normal Reflexes:  Coordination - grossly normal Gait - grossly normal Balance - grossly normal Cranial Nerves: I: smell Not tested  II: visual acuity  OS: Normal  OD: Normal   II: visual fields Full to confrontation  II: pupils Equal, round, reactive to light  III,VII: ptosis None   III,IV,VI: extraocular muscles  Full ROM  V: mastication Normal  V: facial  light touch sensation  Normal  V,VII: corneal reflex  Present  VII: facial muscle function - upper  Normal  VII: facial muscle function - lower Normal  VIII: hearing Not tested  IX: soft palate elevation  Normal  IX,X: gag reflex Present  XI: trapezius strength  5/5  XI: sternocleidomastoid strength 5/5  XI: neck flexion strength  5/5  XII: tongue strength  Normal    Data Review Lab Results  Component Value Date   WBC 11.1 (H) 08/08/2022   HGB 9.1 (L) 08/08/2022   HCT 27.0 (L) 08/08/2022   MCV 91.8 08/08/2022   PLT 392 08/08/2022   Lab Results  Component Value Date   NA 127 (L) 08/08/2022   K 3.9 08/08/2022   CL 96 (L) 08/08/2022   CO2 21 (L) 08/08/2022   BUN 31 (H) 08/08/2022   CREATININE 1.98 (H) 08/08/2022   GLUCOSE 148 (H) 08/08/2022   Lab Results  Component Value Date   INR 1.04 05/06/2018    Assessment/Plan: Wound drainage/infection: I have discussed the situation with the patient.  I recommend an incision and drainage of her wound.  She has decided proceed with surgery.   [name] 08/08/2022 2:47 PM

## 2022-08-08 NOTE — Op Note (Signed)
Brief history: The patient is a 77 year old white female on whom I performed several surgeries.  She has developed wound drainage and I discussed the various treatment options.  She has decided to proceed with surgery.  Rehab diagnosis: Wound infection  Postop diagnosis: The same  Procedure: Incision and drainage of lumbar wound  Surgeon: Dr. Earle Gell  Assistant: None  Anesthesia: General tracheal  Estimated blood loss: Minimal  Specimens: Wound cultures  Drains: None  Complications: None  Description of procedure: The patient was brought to the operating room by the anesthesia team.  General endotracheal anesthesia was induced.  The patient was turned to the prone position on the [place] table.  Her lumbosacral region was then prepared with Betadine scrub and Betadine solution.  Sterile drapes were applied.  I then used a scalpel to size of the patient's lumbar surgical incision.  We immediately countered purulent material in the subcutaneous space.  We obtain cultures.  I used the wheat Char[employer] fund[employer] for exposure.  I then dissected in the subcutaneous space and drained moderate amount of purulent material.  The infection did not seem to track deep to the fascia.  I irrigated the wound out with saline solution.  We obtained hemostasis with bipolar cautery.  I placed Vanco powder in the subcutaneous space.  I removed the retractor.  I reapproximated patient's subcutaneous tissue with interrupted 2-0 Vicryl suture.  I reapproximated the skin with a running 4-0 nylon suture.  The wound was then coated with bacitracin ointment.  A sterile dressing was applied.  The drapes were removed.  By report all sponge, instrument, and needle counts were correct at the end of this case.

## 2022-08-08 NOTE — Progress Notes (Signed)
Pharmacy Antibiotic Note  Mckenzie Lawrence is a 77 y.o. female admitted on 08/08/2022 with  back wound .  Pharmacy has been consulted for vanc dosing.  Pt with a hx of multiple back procedures with the most lumbar fusion about 1.5 mo ago. She develop drainage from wound. She is s/p I&D today. Empiric with vanc/zosyn  Scr 1.98  Plan: Vanc 1.25g IV q48>>AUC 491, scr 1.98 Level as needed  Height: 5' 6.5" (168.9 cm) Weight: 72.1 kg (158 lb 15.2 oz) IBW/kg (Calculated) : 60.45  Temp (24hrs), Avg:97.7 F (36.5 C), Min:97.6 F (36.4 C), Max:97.8 F (36.6 C)  Recent Labs  Lab 08/08/22 1315  WBC 11.1*  CREATININE 1.98*    Estimated Creatinine Clearance: 23.8 mL/min (A) (by C-G formula based on SCr of 1.98 mg/dL (H)).    Allergies  Allergen Reactions   Iodine Anaphylaxis and Other (See Comments)    Pt states that she is allergic to ingested iodine only, okay for betadine.     Shellfish Allergy Anaphylaxis   Codeine Nausea And Vomiting   Morphine Sulfate Nausea And Vomiting   Irbesartan Other (See Comments)     Unknown  (Avapro)   Sulfa Antibiotics Other (See Comments)    Fever     Antimicrobials this admission: 2/22 zosyn>> 2/22 vanc>>  Dose adjustments this admission:   Microbiology results: 2/22 wound>> 2/22 blood>>  Onnie Boer, PharmD, [place], AAHIVP, CPP Infectious Disease Pharmacist 08/08/2022 8:15 PM

## 2022-08-08 NOTE — Transfer of Care (Signed)
Immediate Anesthesia Transfer of Care Note  Patient: Darylene Price Shenk  Procedure(s) Performed: I&D OF LUMBAR WOUND  Patient Location: PACU  Anesthesia Type:General  Level of Consciousness: alert  and patient cooperative  Airway & Oxygen Therapy: Patient connected to face mask oxygen  Post-op Assessment: Report given to RN and Post -op Vital signs reviewed and stable  Post vital signs: Reviewed and stable  Last Vitals:  Vitals Value Taken Time  BP    Temp    Pulse 79 08/08/22 1647  Resp    SpO2 97 % 08/08/22 1647  Vitals shown include unvalidated device data.  Last Pain:  Vitals:   08/08/22 1311  TempSrc:   PainSc: 6          Complications: There were no known notable events for this encounter.

## 2022-08-08 NOTE — Anesthesia Postprocedure Evaluation (Signed)
Anesthesia Post Note  Patient: Mckenzie Lawrence  Procedure(s) Performed: INCISION AND DRAINAGE OF LUMBAR WOUND     Patient location during evaluation: PACU Anesthesia Type: General Level of consciousness: awake and alert Pain management: pain level controlled Vital Signs Assessment: post-procedure vital signs reviewed and stable Respiratory status: spontaneous breathing, nonlabored ventilation and respiratory function stable Cardiovascular status: blood pressure returned to baseline and stable Postop Assessment: no apparent nausea or vomiting Anesthetic complications: no  There were no known notable events for this encounter.  Last Vitals:  Vitals:   08/08/22 1745 08/08/22 1800  BP: (!) 169/44   Pulse: 73   Resp: 20   Temp:  36.4 C  SpO2: 98%     Last Pain:  Vitals:   08/08/22 1730  TempSrc:   PainSc: 6                  Florina Glas,W. EDMOND

## 2022-08-09 ENCOUNTER — Encounter (HOSPITAL_COMMUNITY): Payer: Self-pay | Admitting: Neurosurgery

## 2022-08-09 DIAGNOSIS — N184 Chronic kidney disease, stage 4 (severe): Secondary | ICD-10-CM | POA: Diagnosis not present

## 2022-08-09 DIAGNOSIS — E1169 Type 2 diabetes mellitus with other specified complication: Secondary | ICD-10-CM

## 2022-08-09 DIAGNOSIS — E1122 Type 2 diabetes mellitus with diabetic chronic kidney disease: Secondary | ICD-10-CM | POA: Diagnosis not present

## 2022-08-09 DIAGNOSIS — T847XXA Infection and inflammatory reaction due to other internal orthopedic prosthetic devices, implants and grafts, initial encounter: Secondary | ICD-10-CM

## 2022-08-09 LAB — GLUCOSE, CAPILLARY
Glucose-Capillary: 140 mg/dL — ABNORMAL HIGH (ref 70–99)
Glucose-Capillary: 115 mg/dL — ABNORMAL HIGH (ref 70–99)
Glucose-Capillary: 115 mg/dL — ABNORMAL HIGH (ref 70–99)
Glucose-Capillary: 151 mg/dL — ABNORMAL HIGH (ref 70–99)
Glucose-Capillary: 75 mg/dL (ref 70–99)

## 2022-08-09 MED ORDER — MUPIROCIN 2 % EX OINT
1.0000 | TOPICAL_OINTMENT | Freq: Two times a day (BID) | CUTANEOUS | Status: DC
  Administered 2022-08-09 – 2022-08-13 (×9): 1 via NASAL
  Filled 2022-08-09 (×2): qty 22

## 2022-08-09 MED ORDER — SODIUM CHLORIDE 0.9 % IV SOLN
8.0000 mg/kg | INTRAVENOUS | Status: DC
  Administered 2022-08-09 – 2022-08-11 (×2): 600 mg via INTRAVENOUS
  Filled 2022-08-09 (×3): qty 12

## 2022-08-09 MED ORDER — VANCOMYCIN HCL 1250 MG/250ML IV SOLN: 1250.0000 mg | INTRAVENOUS | Status: DC

## 2022-08-09 MED ORDER — CHLORHEXIDINE GLUCONATE CLOTH 2 % EX PADS
6.0000 | MEDICATED_PAD | Freq: Every day | CUTANEOUS | Status: DC
  Administered 2022-08-09 – 2022-08-13 (×3): 6 via TOPICAL

## 2022-08-09 NOTE — Progress Notes (Signed)
PHARMACY CONSULT NOTE FOR:  OUTPATIENT  PARENTERAL ANTIBIOTIC THERAPY (OPAT)  Indication: Lumbar wound infection Regimen: Oritavancin 1200 mg IV x 1 dose prior to discharge followed by Dalbavancin 500 mg weekly starting on 08/20/22 at [facility] outpatient infusion x 3 doses End date: Last dose 3/19 (provides coverage thru 3/26, ~4.5 weeks from OR 08/08/22)  IV antibiotic discharge orders are pended. To discharging provider:  please sign these orders via discharge navigator,  Select New Orders & click on the button choice - Manage This Unsigned Work.     Thank you for allowing pharmacy to be a part of this patient's care.  Georgina Pillion, PharmD, BCPS Infectious Diseases Clinical Pharmacist 08/13/2022 8:24 AM   **Pharmacist phone directory can now be found on [email or web address] ([email or web address]).  Listed under [facility].

## 2022-08-09 NOTE — Progress Notes (Signed)
PT Cancellation Note  Patient Details Name: Mckenzie Lawrence MRN: [id number] DOB: Mar 30, 1946   Cancelled Treatment:    Reason Eval/Treat Not Completed: Fatigue/lethargy limiting ability to participate;Patient declined, no reason specified Attempted to see pt x2 today. First time, pt was trying to eat some lunch and second time, daughter declined as pt had just fallen asleep. Will follow up as time allows.   Marguarite Arbour A Jalei Shibley 08/09/2022, 1:06 PM Marisa Severin, PT, DPT Acute Rehabilitation Services Secure chat preferred Office [phone]

## 2022-08-09 NOTE — Consult Note (Signed)
[place] for Infectious Disease    Date of Admission:  08/08/2022     Reason for Consult: Wound infection     Referring Physician: NSGY  Current antibiotics: Vancomycin Piperacillin tazobactam  ASSESSMENT:    77 y.o. female admitted with:  Lumbar surgical site infection: Patient with prior history of lumbar spine surgeries and most recent procedure on 07/08/2022 with instrumentation.  Complicated by wound drainage and taken to the OR 08/08/2022 for incision and drainage.  Infection does not appear to track deep below the fascia or involve the hardware per the operative note.  Gram stain with GPC's, cultures pending. Chronic kidney disease: Creatinine is 1.9 which appears consistent with baseline.  Creatinine clearance is approximately 23.8. Diabetes: Hemoglobin A1c in December 2023 was 6.7.  RECOMMENDATIONS:    Change vancomycin to daptomycin per pharmacy Stop piperacillin/tazobactam Follow OR cultures for further antibiotic tailoring Follow blood cultures Would anticipate holding off on PICC line placement until blood cultures are cleared through the weekend and then would place central access on Monday Dr. Candiss Norse will be here over the weekend   Principal Problem:   Wound infection complicating hardware ([place])   MEDICATIONS:    Scheduled Meds:  acetaminophen  1,000 mg Oral Q6H   calcitRIOL  0.25 mcg Oral QHS   Chlorhexidine Gluconate Cloth  6 each Topical Q0600   docusate sodium  100 mg Oral BID   insulin aspart  0-20 Units Subcutaneous Q4H   losartan  100 mg Oral Daily   metoprolol succinate  100 mg Oral QHS   mupirocin ointment  1 Application Nasal BID   pantoprazole  40 mg Oral BID   sodium chloride flush  3 mL Intravenous Q12H   Continuous Infusions:  sodium chloride 250 mL (08/08/22 2043)   piperacillin-tazobactam (ZOSYN)  IV 3.375 g (08/09/22 0507)   [START ON 08/10/2022] vancomycin     PRN Meds:.acetaminophen **OR** acetaminophen, bisacodyl,  cyclobenzaprine, diazepam, gabapentin, menthol-cetylpyridinium **OR** phenol, morphine injection, ondansetron **OR** ondansetron (ZOFRAN) IV, oxyCODONE, oxyCODONE, sodium chloride flush, sucralfate, zolpidem  HPI:    Mckenzie Lawrence is a 77 y.o. female with history of previous lumbar spine surgery in April 2023 and December 2023 who more recently had repeat lumbar spine surgery with instrumentation on 07/08/2022.  She was subsequently discharged 3 days later on 07/11/2022.  This past weekend she noticed some drainage from her incision.  She was prescribed Keflex on Monday by her neurosurgeon and seen in the office the next day.  It was recommended that she proceed with incision and drainage which was undertaken yesterday afternoon.  Per the operative note a scalpel was used to open her lumbar surgical incision at which time purulent material was encountered in the subcutaneous space.  This was cultured.  The infection did not seem to track deep to the fascia or involve the hardware.  She was started empirically on vancomycin and Zosyn.  Blood cultures were obtained yesterday evening after having received some antibiotics.  Her operative Gram stain is showing gram-positive cocci.  Cultures are pending.  We have been consulted for further antibiotic recommendations.   Past Medical History:  Diagnosis Date   Anemia    Arthritis    Bladder incontinence    Broken foot    Cataracts, bilateral    Chronic kidney insufficiency    stage 3b   COPD (chronic obstructive pulmonary disease) (HCC)    wheezing   Coronary artery disease 04/25/2022   in [place]  2021   very mild case   CVA (cerebral vascular accident) ([place]) 2016   has had 3 strokes, states right side is slightlyweaker than left   Diabetes mellitus    insulin dependent, Type 2   GERD (gastroesophageal reflux disease)    HLD (hyperlipidemia)    Hx of cardiovascular stress test    a. ETT (6/13):  Ex 5:13; no ischemic changes   Hypertension     controlled on meds   Lacunar stroke of left subthalamic region [facility]) 02/2015   Leg pain    left   Lower back pain    Neuromuscular disorder (HCC)    stroke right hand tingling   Orthostatic hypotension    Osteopenia 01/2017   T score -2.0 stable from prior DEXA   Pancreatitis 10/2021   PCOS (polycystic ovarian syndrome)    Personal history of tobacco use, presenting hazards to health 01/09/2015   PONV (postoperative nausea and vomiting)    Sleep apnea    uses CPAP nightly    Social History   Tobacco Use   Smoking status: Every Day    Packs/day: 1.50    Years: 50.00    Total pack years: 75.00    Types: Cigarettes   Smokeless tobacco: Never   Tobacco comments:    3ppd x 10 year, then cut back to 1.5pdd since 02/2015  Vaping Use   Vaping Use: Never used  Substance Use Topics   Alcohol use: Never    Alcohol/week: 0.0 standard drinks of alcohol   Drug use: No    Family History  Problem Relation Age of Onset   Hypertension Mother    Heart disease Mother 9       MI   Diabetes Father    Diabetes Sister    Hypertension Sister    Diabetes Brother    Hypertension Brother    Heart disease Brother 17       CAD   Cancer Sister        Multiple myloma   Diabetes Brother     Allergies  Allergen Reactions   Iodine Anaphylaxis and Other (See Comments)    Pt states that she is allergic to ingested iodine only, okay for betadine.     Shellfish Allergy Anaphylaxis   Codeine Nausea And Vomiting   Morphine Sulfate Nausea And Vomiting   Irbesartan Other (See Comments)     Unknown  (Avapro)   Sulfa Antibiotics Other (See Comments)    Fever     Review of Systems  All other systems reviewed and are negative. Except as noted above in HPI   OBJECTIVE:   Blood pressure (!) 162/60, pulse 74, temperature 98.4 F (36.9 C), resp. rate 18, height 5' 6.5" (1.689 m), weight 72.1 kg, SpO2 92 %. Body mass index is 25.27 kg/m.  Physical Exam Constitutional:      General: She  is not in acute distress.    Appearance: Normal appearance.  HENT:     Head: Normocephalic and atraumatic.  Eyes:     Extraocular Movements: Extraocular movements intact.     Conjunctiva/sclera: Conjunctivae normal.  Cardiovascular:     Pulses: Normal pulses.  Pulmonary:     Effort: Pulmonary effort is normal. No respiratory distress.  Abdominal:     General: There is no distension.     Palpations: Abdomen is soft.  Musculoskeletal:        General: Normal range of motion.     Cervical back: Normal range of motion  and neck supple.     Right lower leg: No edema.     Left lower leg: No edema.  Skin:    General: Skin is warm and dry.     Findings: No rash.  Neurological:     General: No focal deficit present.     Mental Status: She is alert and oriented to person, place, and time.  Psychiatric:        Mood and Affect: Mood normal.        Behavior: Behavior normal.      Lab Results: Lab Results  Component Value Date   WBC 11.1 (H) 08/08/2022   HGB 9.1 (L) 08/08/2022   HCT 27.0 (L) 08/08/2022   MCV 91.8 08/08/2022   PLT 392 08/08/2022    Lab Results  Component Value Date   NA 127 (L) 08/08/2022   K 3.9 08/08/2022   CO2 21 (L) 08/08/2022   GLUCOSE 148 (H) 08/08/2022   BUN 31 (H) 08/08/2022   CREATININE 1.98 (H) 08/08/2022   CALCIUM 8.9 08/08/2022   GFRNONAA 26 (L) 08/08/2022   GFRAA 43 (L) 05/06/2018    Lab Results  Component Value Date   ALT 11 08/08/2022   AST 19 08/08/2022   ALKPHOS 91 08/08/2022   BILITOT 0.5 08/08/2022    No results found for: "CRP"  No results found for: "ESRSEDRATE"  I have reviewed the micro and lab results in Epic.  Imaging: No results found.   Imaging independently reviewed in Epic.  Raynelle Highland for Infectious Disease [facility] [phone] pager 08/09/2022, 1:11 PM

## 2022-08-09 NOTE — Progress Notes (Signed)
Subjective: The patient is alert and pleasant.  Her back is appropriately sore.  Objective: Vital signs in last 24 hours: Temp:  [97.6 F (36.4 C)-98.5 F (36.9 C)] 98.1 F (36.7 C) (02/23 0800) Pulse Rate:  [67-81] 76 (02/23 0800) Resp:  [12-20] 18 (02/23 0800) BP: (153-189)/(44-74) 153/58 (02/23 0800) SpO2:  [96 %-100 %] 99 % (02/23 0800) Weight:  [70.8 kg-72.1 kg] 72.1 kg (02/22 2000) Estimated body mass index is 25.27 kg/m as calculated from the following:   Height as of this encounter: 5' 6.5" (1.689 m).   Weight as of this encounter: 72.1 kg.   Intake/Output from previous day: 02/22 0701 - 02/23 0700 In: 975.4 [P.O.:120; I.V.:452.9; IV Piggyback:402.5] Out: 26 [Urine:1; Blood:25] Intake/Output this shift: No intake/output data recorded.  Physical exam the patient is alert and pleasant.  Her strength is normal.  Lab Results: Recent Labs    08/08/22 1315  WBC 11.1*  HGB 9.1*  HCT 27.0*  PLT 392   BMET Recent Labs    08/08/22 1315  NA 127*  K 3.9  CL 96*  CO2 21*  GLUCOSE 148*  BUN 31*  CREATININE 1.98*  CALCIUM 8.9    Studies/Results: No results found.  Assessment/Plan: Stop day #1, status post incision and drainage of wound: The patient's blood and wound cultures are negative so far.  Her Gram stain demonstrates gram-positive cocci.  We will continue vancomycin and Unasyn and ask ID to see the patient.  We have discussed PICC line placement, prolonged IV antibiotics, etc.  I have answered all her questions.  LOS: 1 day     Mckenzie Lawrence 08/09/2022, 9:55 AM     Patient ID: Mckenzie Lawrence, female   DOB: 09/20/1945, 77 y.o.   MRN: [id number]

## 2022-08-09 NOTE — Progress Notes (Signed)
  Transition of Care [facility]) Screening Note   Patient Details  Name: Mckenzie Lawrence Date of Birth: 1946-02-12   Transition of Care [facility]) CM/SW Contact:    Dawayne Patricia, RN Phone Number: 08/09/2022, 1:06 PM    Transition of Care Department [facility]) has reviewed patient and note s/p incision and drainage of wound. ID has been consulted, anticipate pt may need LT IV abx. PT eval pending for post discharge recommendations. We will continue to monitor patient advancement through interdisciplinary progression rounds. If new patient transition needs arise, please place a TOC consult.

## 2022-08-10 LAB — GLUCOSE, CAPILLARY
Glucose-Capillary: 91 mg/dL (ref 70–99)
Glucose-Capillary: 196 mg/dL — ABNORMAL HIGH (ref 70–99)

## 2022-08-10 LAB — CK: Total CK: 43 U/L (ref 38–234)

## 2022-08-10 NOTE — Evaluation (Signed)
Physical Therapy Evaluation Patient Details Name: Mckenzie Lawrence MRN: [id number] DOB: [birth year] Today's Date: 08/10/2022  History of Present Illness  77 y.o. female presents to [facility] hospital on 08/08/2022 with infection after recent spinal fusion. Pt underwent I&D of lumbar wound on 2/22. PMH includes: cataracts, COPD, CVA, DM, arthritis, HTN, orthostatic hypotension, prior back surgery, bil shoulder surgery.  Clinical Impression  Pt presents to PT with deficits in endurance, power, balance, gait. Pt is mobilizing well, not requiring physical assistance at this time and ambulating for limited community distances. Pt will beenfit from frequent mobilization in an effort to further improve activity tolerance. Pt will also benefit from reinforcement of back precautions when returning to bed.       Recommendations for follow up therapy are one component of a multi-disciplinary discharge planning process, led by the attending physician.  Recommendations may be updated based on patient status, additional functional criteria and insurance authorization.  Follow Up Recommendations Home health PT      Assistance Recommended at Discharge PRN  Patient can return home with the following  A little help with bathing/dressing/bathroom;Assist for transportation;Help with stairs or ramp for entrance    Equipment Recommendations Rollator (4 wheels) (spouse unable to carry device up/down stairs since surgery)  Recommendations for Other Services       Functional Status Assessment Patient has had a recent decline in their functional status and demonstrates the ability to make significant improvements in function in a reasonable and predictable amount of time.     Precautions / Restrictions Precautions Precautions: Back Precaution Booklet Issued: No Restrictions Weight Bearing Restrictions: No      Mobility  Bed Mobility Overal bed mobility: Needs Assistance Bed Mobility: Rolling, Sidelying to Sit, Sit  to Supine Rolling: Supervision Sidelying to sit: Supervision   Sit to supine: Supervision   General bed mobility comments: will benefit from reinforcement of cues to return to sidelying and log roll when getting back to bed    Transfers Overall transfer level: Needs assistance Equipment used: Rolling walker (2 wheels) Transfers: Sit to/from Stand Sit to Stand: Supervision                Ambulation/Gait Ambulation/Gait assistance: Supervision Gait Distance (Feet): 500 Feet Assistive device: Rolling walker (2 wheels) Gait Pattern/deviations: Step-through pattern Gait velocity: functional Gait velocity interpretation: 1.31 - 2.62 ft/sec, indicative of limited community ambulator   General Gait Details: steady step-through Development worker, international aid    Modified Rankin (Stroke Patients Only)       Balance Overall balance assessment: Needs assistance Sitting-balance support: No upper extremity supported, Feet supported Sitting balance-Leahy Scale: Good     Standing balance support: Single extremity supported, Reliant on assistive device for balance Standing balance-Leahy Scale: Poor                               Pertinent Vitals/Pain Pain Assessment Pain Assessment: Faces Faces Pain Scale: Hurts little more Pain Location: back Pain Descriptors / Indicators: Grimacing Pain Intervention(s): Monitored during session    Home Living Family/patient expects to be discharged to:: Private residence Living Arrangements: Spouse/significant other Available Help at Discharge: Family;Available 24 hours/day Type of Home: House Home Access: Stairs to enter Entrance Stairs-Rails: Right;Left;Can reach both Entrance Stairs-Number of Steps: 4-5   Home Layout: One level Home Equipment: BSC/3in1;Shower seat - built in;Grab bars - toilet;Grab bars -  tub/shower;Rollator (4 wheels);Rolling Walker (2 wheels);Hand held shower head;Adaptive  equipment;Other (comment)      Prior Function Prior Level of Function : Independent/Modified Independent             Mobility Comments: ambulatory with rollator, receiving HHPT       Hand Dominance   Dominant Hand: Right    Extremity/Trunk Assessment   Upper Extremity Assessment Upper Extremity Assessment: Overall WFL for tasks assessed    Lower Extremity Assessment Lower Extremity Assessment: Overall WFL for tasks assessed    Cervical / Trunk Assessment Cervical / Trunk Assessment: Back Surgery  Communication   Communication: No difficulties  Cognition Arousal/Alertness: Awake/alert Behavior During Therapy: WFL for tasks assessed/performed Overall Cognitive Status: Within Functional Limits for tasks assessed                                          General Comments General comments (skin integrity, edema, etc.): VSS on RA    Exercises     Assessment/Plan    PT Assessment Patient needs continued PT services  PT Problem List Decreased activity tolerance;Decreased balance;Decreased mobility;Decreased knowledge of precautions       PT Treatment Interventions DME instruction;Gait training;Functional mobility training;Therapeutic activities;Stair training;Balance training;Neuromuscular re-education;Patient/family education    PT Goals (Current goals can be found in the Care Plan section)  Acute Rehab PT Goals Patient Stated Goal: to return home PT Goal Formulation: With patient Time For Goal Achievement: 08/24/22 Potential to Achieve Goals: Good    Frequency Min 3X/week     Co-evaluation               AM-PAC PT "6 Clicks" Mobility  Outcome Measure Help needed turning from your back to your side while in a flat bed without using bedrails?: A Little Help needed moving from lying on your back to sitting on the side of a flat bed without using bedrails?: A Little Help needed moving to and from a bed to a chair (including a  wheelchair)?: A Little Help needed standing up from a chair using your arms (e.g., wheelchair or bedside chair)?: A Little Help needed to walk in hospital room?: A Little Help needed climbing 3-5 steps with a railing? : A Little 6 Click Score: 18    End of Session   Activity Tolerance: Patient tolerated treatment well Patient left: in bed;with call bell/phone within reach;with bed alarm set Nurse Communication: Mobility status PT Visit Diagnosis: Other abnormalities of gait and mobility (R26.89)    Time: [id number] PT Time Calculation (min) (ACUTE ONLY): 18 min   Charges:   PT Evaluation $PT Eval Low Complexity: [place], PT, DPT Acute Rehabilitation Office [phone]   Zenaida Niece 08/10/2022, 3:21 PM

## 2022-08-10 NOTE — Progress Notes (Signed)
Subjective: The patient is alert and pleasant.  She is in no apparent distress.  Objective: Vital signs in last 24 hours: Temp:  [98.3 F (36.8 C)-98.6 F (37 C)] 98.6 F (37 C) (02/24 0719) Pulse Rate:  [73-95] 95 (02/24 0719) Resp:  [16-20] 17 (02/24 0719) BP: (162-193)/(58-83) 179/62 (02/24 0719) SpO2:  [92 %-100 %] 96 % (02/24 0719) Estimated body mass index is 25.27 kg/m as calculated from the following:   Height as of this encounter: 5' 6.5" (1.689 m).   Weight as of this encounter: 72.1 kg.   Intake/Output from previous day: 02/23 0701 - 02/24 0700 In: 568.3 [P.O.:480; I.V.:40.9; IV Piggyback:47.5] Out: -  Intake/Output this shift: No intake/output data recorded.  Physical exam the patient is alert and pleasant.  Her strength is normal.  So far her blood and wound cultures are negative.  The Gram stain shows gram-positive cocci.  Lab Results: Recent Labs    08/08/22 1315  WBC 11.1*  HGB 9.1*  HCT 27.0*  PLT 392   BMET Recent Labs    08/08/22 1315  NA 127*  K 3.9  CL 96*  CO2 21*  GLUCOSE 148*  BUN 31*  CREATININE 1.98*  CALCIUM 8.9    Studies/Results: No results found.  Assessment/Plan: Wound infection: I appreciate IDs help.  We will await the final final cultures and recommendations from ID.   LOS: 2 days     [name] 08/10/2022, 8:59 AM     Patient ID: Mckenzie Lawrence, female   DOB: 1945/08/29, 77 y.o.   MRN: [id number]

## 2022-08-11 LAB — GLUCOSE, CAPILLARY
Glucose-Capillary: 129 mg/dL — ABNORMAL HIGH (ref 70–99)
Glucose-Capillary: 134 mg/dL — ABNORMAL HIGH (ref 70–99)
Glucose-Capillary: 209 mg/dL — ABNORMAL HIGH (ref 70–99)
Glucose-Capillary: 96 mg/dL (ref 70–99)

## 2022-08-11 NOTE — Progress Notes (Signed)
Physical Therapy Treatment Patient Details Name: Mckenzie Lawrence MRN: [id number] DOB: Apr 05, 1946 Today's Date: 08/11/2022   History of Present Illness 77 y.o. female presents to [facility] hospital on 08/08/2022 with infection after recent spinal fusion. Pt underwent I&D of lumbar wound on 2/22. PMH includes: cataracts, COPD, CVA, DM, arthritis, HTN, orthostatic hypotension, prior back surgery, bil shoulder surgery.    PT Comments    Pt reporting moderate back pain, mobilizing well with good maintenance of spinal precautions. Pt has been mobilizing in room with daughter, states she is doing much better. PT to continue to follow.      Recommendations for follow up therapy are one component of a multi-disciplinary discharge planning process, led by the attending physician.  Recommendations may be updated based on patient status, additional functional criteria and insurance authorization.  Follow Up Recommendations  Home health PT     Assistance Recommended at Discharge PRN  Patient can return home with the following A little help with bathing/dressing/bathroom;Assist for transportation;Help with stairs or ramp for entrance   Equipment Recommendations  Rollator (4 wheels)    Recommendations for Other Services       Precautions / Restrictions Precautions Precautions: Back Precaution Booklet Issued: No Precaution Comments: reviewed log roll technique when moving sit>supine Restrictions Weight Bearing Restrictions: No     Mobility  Bed Mobility Overal bed mobility: Needs Assistance Bed Mobility: Rolling, Sit to Sidelying Rolling: Supervision       Sit to sidelying: Min assist General bed mobility comments: assist for LE lift back into bed    Transfers Overall transfer level: Needs assistance Equipment used: Rolling walker (2 wheels) Transfers: Sit to/from Stand Sit to Stand: Supervision                Ambulation/Gait Ambulation/Gait assistance: Supervision Gait  Distance (Feet): 350 Feet Assistive device: Rolling walker (2 wheels) Gait Pattern/deviations: Step-through pattern, Trunk flexed Gait velocity: decr     General Gait Details: cues for upright posture, placement in RW. seated rest x1 given fatigue   Stairs             Wheelchair Mobility    Modified Rankin (Stroke Patients Only)       Balance Overall balance assessment: Needs assistance Sitting-balance support: No upper extremity supported, Feet supported Sitting balance-Leahy Scale: Good     Standing balance support: Single extremity supported, Reliant on assistive device for balance Standing balance-Leahy Scale: Poor                              Cognition Arousal/Alertness: Awake/alert Behavior During Therapy: WFL for tasks assessed/performed Overall Cognitive Status: Within Functional Limits for tasks assessed                                          Exercises      General Comments        Pertinent Vitals/Pain Pain Assessment Pain Assessment: 0-10 Pain Score: 5  Pain Location: back Pain Descriptors / Indicators: Sore, Discomfort Pain Intervention(s): Limited activity within patient's tolerance, Monitored during session, Repositioned    Home Living                          Prior Function            PT Goals (current goals can  now be found in the care plan section) Acute Rehab PT Goals Patient Stated Goal: to return home PT Goal Formulation: With patient Time For Goal Achievement: 08/24/22 Potential to Achieve Goals: Good Progress towards PT goals: Progressing toward goals    Frequency    Min 3X/week      PT Plan Current plan remains appropriate    Co-evaluation              AM-PAC PT "6 Clicks" Mobility   Outcome Measure  Help needed turning from your back to your side while in a flat bed without using bedrails?: A Little Help needed moving from lying on your back to sitting on the  side of a flat bed without using bedrails?: A Little Help needed moving to and from a bed to a chair (including a wheelchair)?: A Little Help needed standing up from a chair using your arms (e.g., wheelchair or bedside chair)?: A Little Help needed to walk in hospital room?: A Little Help needed climbing 3-5 steps with a railing? : A Little 6 Click Score: 18    End of Session   Activity Tolerance: Patient tolerated treatment well Patient left: in bed;with call bell/phone within reach;with family/visitor present Nurse Communication: Mobility status PT Visit Diagnosis: Other abnormalities of gait and mobility (R26.89)     Time: [id number] PT Time Calculation (min) (ACUTE ONLY): 15 min  Charges:  $Gait Training: 8-22 mins                     Stacie Glaze, PT DPT Acute Rehabilitation Services Pager [phone]  Office [phone]    Roxine Caddy E Ruffin Pyo 08/11/2022, 4:59 PM

## 2022-08-11 NOTE — Progress Notes (Signed)
Subjective: The patient is alert and pleasant.  She looks and feels better.  Her daughter is at the bedside.  The patient is anxious to have Botox injection by Dr. Watt Climes next week.  Objective: Vital signs in last 24 hours: Temp:  [97.5 F (36.4 C)-98.4 F (36.9 C)] 97.7 F (36.5 C) (02/25 0801) Pulse Rate:  [66-95] 66 (02/25 0801) Resp:  [16-20] 18 (02/25 0801) BP: (138-183)/(48-64) 173/59 (02/25 0801) SpO2:  [94 %-100 %] 100 % (02/25 0801) Estimated body mass index is 25.27 kg/m as calculated from the following:   Height as of this encounter: 5' 6.5" (1.689 m).   Weight as of this encounter: 72.1 kg.   Intake/Output from previous day: 02/24 0701 - 02/25 0700 In: 133 [I.V.:71; IV Piggyback:62] Out: -  Intake/Output this shift: No intake/output data recorded.  Physical exam the patient is alert and pleasant.  Her strength is normal.  Her dressing has a small blood stain.  Lab Results: Recent Labs    08/08/22 1315  WBC 11.1*  HGB 9.1*  HCT 27.0*  PLT 392   BMET Recent Labs    08/08/22 1315  NA 127*  K 3.9  CL 96*  CO2 21*  GLUCOSE 148*  BUN 31*  CREATININE 1.98*  CALCIUM 8.9    Studies/Results: No results found.  Assessment/Plan: Postop day #3: The patient's cultures are growing Staph aureus.  We are awaiting the sensitivities.  We will plan to place a PICC line and hopefully get her home with IV antibiotics tomorrow.  Have answered all her questions.  LOS: 3 days     Mckenzie Lawrence 08/11/2022, 9:33 AM     Patient ID: Mckenzie Lawrence, female   DOB: July 17, 1945, 77 y.o.   MRN: [id number]

## 2022-08-12 ENCOUNTER — Ambulatory Visit (HOSPITAL_COMMUNITY): Admission: RE | Admit: 2022-08-12 | Payer: PPO | Source: Home / Self Care | Admitting: Gastroenterology

## 2022-08-12 ENCOUNTER — Encounter (HOSPITAL_COMMUNITY): Admission: RE | Payer: Self-pay | Source: Home / Self Care

## 2022-08-12 ENCOUNTER — Other Ambulatory Visit (HOSPITAL_COMMUNITY): Payer: Self-pay

## 2022-08-12 ENCOUNTER — Inpatient Hospital Stay: Payer: Self-pay

## 2022-08-12 DIAGNOSIS — T847XXD Infection and inflammatory reaction due to other internal orthopedic prosthetic devices, implants and grafts, subsequent encounter: Secondary | ICD-10-CM | POA: Diagnosis not present

## 2022-08-12 DIAGNOSIS — N184 Chronic kidney disease, stage 4 (severe): Secondary | ICD-10-CM | POA: Diagnosis not present

## 2022-08-12 LAB — GLUCOSE, CAPILLARY
Glucose-Capillary: 118 mg/dL — ABNORMAL HIGH (ref 70–99)
Glucose-Capillary: 144 mg/dL — ABNORMAL HIGH (ref 70–99)
Glucose-Capillary: 214 mg/dL — ABNORMAL HIGH (ref 70–99)
Glucose-Capillary: 168 mg/dL — ABNORMAL HIGH (ref 70–99)

## 2022-08-12 SURGERY — ESOPHAGOGASTRODUODENOSCOPY (EGD) WITH PROPOFOL
Anesthesia: Monitor Anesthesia Care

## 2022-08-12 MED ORDER — NICOTINE 21 MG/24HR TD PT24
21.0000 mg | MEDICATED_PATCH | Freq: Every day | TRANSDERMAL | Status: DC
  Administered 2022-08-12 – 2022-08-13 (×2): 21 mg via TRANSDERMAL
  Filled 2022-08-12 (×2): qty 1

## 2022-08-12 NOTE — Progress Notes (Signed)
At bedside, to insert PICC;as per RN pt does not PICC to be place;patient have 3 doses of  infusion left.

## 2022-08-12 NOTE — TOC Initial Note (Signed)
Transition of Care (TOC) - Initial/Assessment Note  Mckenzie Gibbons RN,BSN Transitions of Care Unit 4NP (Non Trauma)- RN Case Manager See Treatment Team for direct Phone #   Patient Details  Name: Mckenzie Lawrence MRN: [id number] Date of Birth: 16-Jul-1945  Transition of Care [facility]) CM/SW Contact:    Dawayne Patricia, RN Phone Number: 08/12/2022, 3:59 PM  Clinical Narrative:                 CM spoke with pt and her daughter at bedside regarding transition needs for LT IV abx.  Note that ID is following for abx plan which final plan is pending at this time.  Per conversation with pt and daughter pt lives at home with spouse who has dementia and daughter lives 2.5 hr away. There is no one else to assist pt with home IV abx and there is concern about pt returning home with PICC to do the abx at home. Pt and daughter have discussed options with ID- Dr. Juleen China and are open to pt going to outpt infusion center if possible. [name] spouse still drives short distances and could drive to [facility] if needed once a week for the infusion.  Pt also reports she is active with [facility] for HHPT and would like to continue therapy services with them.  Pt is voicing she would like to go home today- CM has explained that the abx plan is still not finalized and will most like take today to come up with plan and get things arranged- pt voiced she "needs a cigarette or I've got to get out of here" CM spoke with bedside RN will will follow up for nicotine patch.   CM spoke with Pam from [facility] who was contacted by ID for IV abx coordination needs. Pam is looking into cost for abx needs and will come to speak with pt and daughter with regards to options and planning.   CM also reached out to Amy with [facility] to see if adding a HHRN for home abx needs might be an option for pt if abx needs was only 1x week for the next 3 weeks. Amy to check on staffing and director and get back with CM.   CM to continue to assist in  coordinating LT IV abx needs for transition home.   Expected Discharge Plan: [place] Barriers to Discharge: Continued Medical Work up   Patient Goals and CMS Choice Patient states their goals for this hospitalization and ongoing recovery are:: "I want to go home" CMS Medicare.gov Compare Post Acute Care list provided to:: Patient Choice offered to / list presented to : Patient, Adult Children      Expected Discharge Plan and Services   Discharge Planning Services: CM Consult Post Acute Care Choice: Durable Medical Equipment, Home Health, Resumption of Svcs/PTA Provider Living arrangements for the past 2 months: Single Family Home                 DME Arranged: N/A DME Agency: NA       HH Arranged: PT, OT, RN, IV Antibiotics HH Agency: [facility], [place] [facility] [place] Agency Contacted: 08/12/22 Time HH Agency Contacted: 1400 Representative spoke with at [place]: [place] Arrangements/Services Living arrangements for the past 2 months: [place] with:: Self Patient language and need for interpreter reviewed:: Yes Do you feel safe going back to the place where you live?: Yes      Need for  Family Participation in Patient Care: Yes (Comment) Care giver support system in place?: Yes (comment) Current home services: DME Criminal Activity/Legal Involvement Pertinent to Current Situation/Hospitalization: No - Comment as needed  Activities of Daily Living Home Assistive Devices/Equipment: Gilford Rile (specify type) ADL Screening (condition at time of admission) Patient's cognitive ability adequate to safely complete daily activities?: Yes Is the patient deaf or have difficulty hearing?: Yes Does the patient have difficulty seeing, even when wearing glasses/contacts?: No Does the patient have difficulty concentrating, remembering, or making decisions?: No Patient able to express need for assistance with ADLs?: Yes Does the patient have  difficulty dressing or bathing?: Yes Independently performs ADLs?: Yes (appropriate for developmental age) Does the patient have difficulty walking or climbing stairs?: Yes Weakness of Legs: Both Weakness of Arms/Hands: None  Permission Sought/Granted Permission sought to share information with : Chartered certified accountant granted to share information with : Yes, Verbal Permission Granted     Permission granted to share info w AGENCY: HH/Infusion        Emotional Assessment Appearance:: Appears stated age Attitude/Demeanor/Rapport: Engaged Affect (typically observed): Appropriate Orientation: : Oriented to Self, Oriented to Place, Oriented to  Time, Oriented to Situation Alcohol / Substance Use: Tobacco Use Psych Involvement: No (comment)  Admission diagnosis:  Wound infection complicating hardware ([place]) [id number]] Patient Active Problem List   Diagnosis Date Noted   Type 2 diabetes mellitus with other specified complication ([place]) [id number]   Stage 4 chronic kidney disease ([place]) 08/09/2022   Wound infection complicating hardware ([place]) 08/08/2022   S/P lumbar spinal fusion 07/08/2022   Recurrent displacement of lumbar disc 05/20/2022   Acute cystitis without hematuria    Acute pancreatitis 11/10/2021   Acute lower UTI 11/10/2021   Diabetes mellitus with renal manifestations, controlled ([place]) 11/10/2021   CKD (chronic kidney disease) stage 4, GFR 15-29 ml/min (HCC) 11/10/2021   Lumbar spinal stenosis 10/01/2021   Situational anxiety 07/08/2019   Chronic kidney disease, stage 3 unspecified ([place]) 03/01/2019   Edema of lower extremity 03/01/2019   Proteinuria 03/01/2019   Encounter for orthopedic follow-up care 11/04/2018   CAD (coronary artery disease) 10/26/2018   Pain in right hand 07/22/2018   Degeneration of lumbar intervertebral disc 02/06/2018   Scoliosis deformity of spine 02/06/2018   Spondylolisthesis, grade 1 02/06/2018   Low back pain 02/04/2018    Spinal stenosis of lumbar region 02/04/2018   Trigger finger 01/26/2018   CVA (cerebral vascular accident) ([place]) 03/25/2016   OSA (obstructive sleep apnea) 02/15/2016   Hyponatremia 09/25/2015   Vitamin B12 deficiency 09/12/2015   Lacunar stroke of left subthalamic region [facility]) 09/12/2015   Diabetic polyneuropathy associated with diabetes mellitus due to underlying condition ([place]) 09/12/2015   Stroke ([place]) 03/15/2015   Accelerated hypertension 03/15/2015   Vertigo 09/24/2013   Female stress incontinence 02/10/2013   Incomplete emptying of bladder 02/10/2013   Increased frequency of urination 02/10/2013   Urge incontinence 02/10/2013   Syncope 12/04/2011   Tobacco abuse 12/04/2011   Elevated cholesterol    Cataracts, bilateral    Hypertension    Broken foot    PCOS (polycystic ovarian syndrome)    Type 2 diabetes mellitus with hyperlipidemia ([place])    Osteopenia    PCP:  [place] Pink, MD Pharmacy:   [place] [place] [facility] [address] [place] [address] Phone: [phone] Fax: [phone]     Social Determinants of Health (SDOH) Social History: [place]  Insecurity: No Food Insecurity (08/08/2022)  Housing: Low Risk  (08/08/2022)  Transportation Needs: No Transportation Needs (08/08/2022)  Utilities: Not At Risk (08/08/2022)  Tobacco Use: High Risk (08/09/2022)   SDOH Interventions:     Readmission Risk Interventions    11/10/2021    2:49 PM  Readmission Risk Prevention Plan  Transportation Screening Complete  PCP or Specialist Appt within 3-5 Days Complete  HRI or [place] Complete  Social Work Consult for [place] Planning/Counseling Complete  Palliative Care Screening Not Applicable  Medication Review Press photographer) Complete

## 2022-08-12 NOTE — Progress Notes (Signed)
Physical Therapy Treatment Patient Details Name: Mckenzie Lawrence MRN: [id number] DOB: 1946/04/25 Today's Date: 08/12/2022   History of Present Illness 77 y.o. female [facility] to [facility] hospital on [facility] with infection after recent spinal fusion. Pt underwent I&D of lumbar wound on 2/22. PMH includes: cataracts, COPD, CVA, DM, arthritis, HTN, orthostatic hypotension, prior back surgery, bil shoulder surgery.    PT Comments    Patient reports plans for home tomorrow.  States remembers practicing car transfers and stairs with HHPT and is without questions or concerns.  Noted difficulty with sit to supine, but reports gets in at home on other side.  Reminders about technique.  Daughter in the room and supportive.  Will follow up if not d/c.    Recommendations for follow up therapy are one component of a multi-disciplinary discharge planning process, led by the attending physician.  Recommendations may be updated based on patient status, additional functional criteria and insurance authorization.  Follow Up Recommendations  Home health PT     Assistance Recommended at Discharge PRN  Patient can return home with the following A little help with bathing/dressing/bathroom;Assist for transportation;Help with stairs or ramp for entrance   Equipment Recommendations  Rollator (4 wheels)    Recommendations for Other Services       Precautions / Restrictions Precautions Precautions: Back Precaution Comments: reviewed log roll technique when moving sit>supine     Mobility  Bed Mobility   Bed Mobility: Sit to Sidelying         Sit to sidelying: Supervision General bed mobility comments: cues for technique as laid back lifting legs insteady of to side first    Transfers Overall transfer level: Needs assistance Equipment used: Rolling walker (2 wheels) Transfers: Sit to/from Stand Sit to Stand: Supervision           General transfer comment: effortful from armchair in lobby with  heavy UE support needed    Ambulation/Gait Ambulation/Gait assistance: Supervision Gait Distance (Feet): 300 Feet Assistive device: Rollator (4 wheels) Gait Pattern/deviations: Step-through pattern, Trunk flexed       General Gait Details: seated rest x 1 in lobby flexed at hips   Stairs Stairs:  (declined stair practice stating performed with HHPT at her home several times)           Wheelchair Mobility    Modified Rankin (Stroke Patients Only)       Balance Overall balance assessment: Needs assistance Sitting-balance support: Feet supported Sitting balance-Leahy Scale: Good     Standing balance support: No upper extremity supported, During functional activity Standing balance-Leahy Scale: Fair Standing balance comment: while washing hands after toileting prior to ambulation                            Cognition Arousal/Alertness: Awake/alert Behavior During Therapy: WFL for tasks assessed/performed Overall Cognitive Status: Within Functional Limits for tasks assessed                                          Exercises      General Comments General comments (skin integrity, edema, etc.): daughter present and supportive and reports plans for home, asking about HHPT resumption, messaged RNCM      Pertinent Vitals/Pain Pain Assessment Faces Pain Scale: No hurt    Home Living  Prior Function            PT Goals (current goals can now be found in the care plan section) Progress towards PT goals: Progressing toward goals    Frequency    Min 3X/week      PT Plan Current plan remains appropriate    Co-evaluation              AM-PAC PT "6 Clicks" Mobility   Outcome Measure  Help needed turning from your back to your side while in a flat bed without using bedrails?: A Little Help needed moving from lying on your back to sitting on the side of a flat bed without using bedrails?:  A Little Help needed moving to and from a bed to a chair (including a wheelchair)?: A Little Help needed standing up from a chair using your arms (e.g., wheelchair or bedside chair)?: A Little Help needed to walk in hospital room?: A Little Help needed climbing 3-5 steps with a railing? : A Little 6 Click Score: 18    End of Session   Activity Tolerance: Patient tolerated treatment well Patient left: in bed;with call bell/phone within reach;with family/visitor present   PT Visit Diagnosis: Other abnormalities of gait and mobility (R26.89)     Time: [id number] PT Time Calculation (min) (ACUTE ONLY): 22 min  Charges:  $Gait Training: 8-22 mins                     Magda Kiel, PT Acute Rehabilitation Services Office:[phone] 08/12/2022    Reginia Naas 08/12/2022, 4:55 PM

## 2022-08-12 NOTE — Progress Notes (Signed)
Subjective:    The patient is alert and pleasant.  She wants to go home.  Objective: Vital signs in last 24 hours: Temp:  [97.8 F (36.6 C)-98.5 F (36.9 C)] 97.9 F (36.6 C) (02/26 0809) Pulse Rate:  [67-87] 74 (02/26 0809) Resp:  [18-20] 18 (02/25 2348) BP: (158-206)/(55-69) 183/55 (02/26 0809) SpO2:  [91 %-100 %] 97 % (02/26 0809) Estimated body mass index is 25.27 kg/m as calculated from the following:   Height as of this encounter: 5' 6.5" (1.689 m).   Weight as of this encounter: 72.1 kg.   Intake/Output from previous day: 02/25 0701 - 02/26 0700 In: 240 [P.O.:240] Out: -  Intake/Output this shift: No intake/output data recorded.  Physical exam   The patient is alert and oriented.  Her strength is normal.  Lab Results: No results for input(s): "WBC", "HGB", "HCT", "PLT" in the last 72 hours. BMET No results for input(s): "NA", "K", "CL", "CO2", "GLUCOSE", "BUN", "CREATININE", "CALCIUM" in the last 72 hours.  Studies/Results: [name] EKG SITE RITE  Result Date: 08/12/2022 If [facility] image not attached, placement could not be confirmed due to current cardiac rhythm.   Assessment/Plan:   The patient's cultures are growing MRSA.  I will pass for a PICC line to be placed.  We will await final recommendations regarding length of antibiotic treatment from ID.  Hopefully we get her home in the next day or 2.  LOS: 4 days     Mckenzie Lawrence 08/12/2022, 11:10 AM     Patient ID: Mckenzie Lawrence, female   DOB: 07-Apr-1946, 77 y.o.   MRN: [id number]

## 2022-08-12 NOTE — Progress Notes (Incomplete)
Dalbavancin outpatient infusions

## 2022-08-12 NOTE — Progress Notes (Signed)
[place] for Infectious Disease  Date of Admission:  08/08/2022           Reason for visit: Follow up on back infection  Current antibiotics: Daptomycin      ASSESSMENT:    77 y.o. female admitted with:  Lumbar surgical site infection: Patient with prior history of lumbar spine surgeries and most recent procedure on 07/08/2022 with instrumentation.  Complicated by wound drainage and taken to the OR 08/08/2022 for incision and drainage.  Infection does not appear to track deep below the fascia or involve the hardware per the operative note.  Cultures have grown MRSA and patient is stable on daptomycin. Chronic kidney disease: Creatinine is 1.9 which appears consistent with baseline.  Creatinine clearance is approximately 23.8.  RECOMMENDATIONS:    Continue daptomycin for now Discussed with patient and daughter possible antibiotic options as they're hesitant about going home with PICC line and having to administer daptomycin every 48 hours on their own with patient's husband dealing with dementia Additionally, there is an issue with cellular outages and home health being able to get authorization for home antibiotic therapies to determine costs Discussed with patient and daughter possible alternative antibiotic approach consisting of weekly dalbavancin infusions x 4 weeks to complete therapy in the setting of her infection that did not track below the fascia or involve the hardware This approach would be their preferred option if insurance allows.  If not, there next preference would be daptomycin Given her creatinine clearance of less than 30, if she does go home on daptomycin would recommend a tunneled line with IR placement Will follow for home health recommendations Hopefully we can get answers to these questions in the next 24 hours   Principal Problem:   Wound infection complicating hardware ([place]) Active Problems:   Type 2 diabetes mellitus with other specified  complication (HCC)   Stage 4 chronic kidney disease ([place])    MEDICATIONS:    Scheduled Meds:  calcitRIOL  0.25 mcg Oral QHS   Chlorhexidine Gluconate Cloth  6 each Topical Q0600   docusate sodium  100 mg Oral BID   insulin aspart  0-20 Units Subcutaneous Q4H   losartan  100 mg Oral Daily   metoprolol succinate  100 mg Oral QHS   mupirocin ointment  1 Application Nasal BID   pantoprazole  40 mg Oral BID   sodium chloride flush  3 mL Intravenous Q12H   Continuous Infusions:  sodium chloride 250 mL (08/08/22 2043)   DAPTOmycin (CUBICIN) 600 mg in sodium chloride 0.9 % IVPB 600 mg (08/11/22 2038)   PRN Meds:.acetaminophen **OR** acetaminophen, bisacodyl, cyclobenzaprine, diazepam, gabapentin, menthol-cetylpyridinium **OR** phenol, morphine injection, ondansetron **OR** ondansetron (ZOFRAN) IV, oxyCODONE, oxyCODONE, sodium chloride flush, sucralfate, zolpidem  SUBJECTIVE:   24 hour events:  No acute events PT recommending Home health Blood cx are NGTD OR Cx = MRSA  No new complaints Her daughter is at the bedside She is anxious to go home, however, her concerns about her ability to administer IV antibiotics safely at home with her husband who suffers from dementia  Review of Systems  All other systems reviewed and are negative.     OBJECTIVE:   Blood pressure (!) 158/61, pulse 72, temperature 98.4 F (36.9 C), temperature source Oral, resp. rate 18, height 5' 6.5" (1.689 m), weight 72.1 kg, SpO2 96 %. Body mass index is 25.27 kg/m.  Physical Exam Constitutional:      Appearance: Normal appearance.  HENT:  Head: Normocephalic and atraumatic.  Eyes:     Extraocular Movements: Extraocular movements intact.     Conjunctiva/sclera: Conjunctivae normal.  Pulmonary:     Effort: Pulmonary effort is normal. No respiratory distress.  Abdominal:     General: There is no distension.     Palpations: Abdomen is soft.  Musculoskeletal:     Cervical back: Normal range of  motion and neck supple.  Skin:    General: Skin is warm and dry.  Neurological:     General: No focal deficit present.     Mental Status: She is alert and oriented to person, place, and time.  Psychiatric:        Mood and Affect: Mood normal.        Behavior: Behavior normal.      Lab Results: Lab Results  Component Value Date   WBC 11.1 (H) 08/08/2022   HGB 9.1 (L) 08/08/2022   HCT 27.0 (L) 08/08/2022   MCV 91.8 08/08/2022   PLT 392 08/08/2022    Lab Results  Component Value Date   NA 127 (L) 08/08/2022   K 3.9 08/08/2022   CO2 21 (L) 08/08/2022   GLUCOSE 148 (H) 08/08/2022   BUN 31 (H) 08/08/2022   CREATININE 1.98 (H) 08/08/2022   CALCIUM 8.9 08/08/2022   GFRNONAA 26 (L) 08/08/2022   GFRAA 43 (L) 05/06/2018    Lab Results  Component Value Date   ALT 11 08/08/2022   AST 19 08/08/2022   ALKPHOS 91 08/08/2022   BILITOT 0.5 08/08/2022    No results found for: "CRP"  No results found for: "ESRSEDRATE"   I have reviewed the micro and lab results in Epic.  Imaging: No results found.   Imaging independently reviewed in Epic.    Raynelle Highland for Infectious Disease [facility] [phone] pager 08/12/2022, 5:27 AM  I have personally spent 50 minutes involved in face-to-face and non-face-to-face activities for this patient on the day of the visit. Professional time spent includes the following activities: Preparing to see the patient (review of tests), Obtaining and/or reviewing separately obtained history (admission/discharge record), Performing a medically appropriate examination and/or evaluation , Ordering medications/tests/procedures, referring and communicating with other health care professionals, Documenting clinical information in the EMR, Independently interpreting results (not separately reported), Communicating results to the patient/family/caregiver, Counseling and educating the patient/family/caregiver and Care  coordination (not separately reported).

## 2022-08-13 ENCOUNTER — Other Ambulatory Visit (HOSPITAL_COMMUNITY): Payer: Self-pay

## 2022-08-13 DIAGNOSIS — T847XXD Infection and inflammatory reaction due to other internal orthopedic prosthetic devices, implants and grafts, subsequent encounter: Secondary | ICD-10-CM | POA: Diagnosis not present

## 2022-08-13 DIAGNOSIS — T8189XA Other complications of procedures, not elsewhere classified, initial encounter: Secondary | ICD-10-CM | POA: Diagnosis present

## 2022-08-13 LAB — CULTURE, BLOOD (ROUTINE X 2)
Culture: NO GROWTH
Culture: NO GROWTH
Special Requests: ADEQUATE
Special Requests: ADEQUATE

## 2022-08-13 LAB — AEROBIC/ANAEROBIC CULTURE W GRAM STAIN (SURGICAL/DEEP WOUND)

## 2022-08-13 LAB — GLUCOSE, CAPILLARY
Glucose-Capillary: 125 mg/dL — ABNORMAL HIGH (ref 70–99)
Glucose-Capillary: 140 mg/dL — ABNORMAL HIGH (ref 70–99)
Glucose-Capillary: 216 mg/dL — ABNORMAL HIGH (ref 70–99)
Glucose-Capillary: 231 mg/dL — ABNORMAL HIGH (ref 70–99)

## 2022-08-13 LAB — BASIC METABOLIC PANEL
Anion gap: 8 (ref 5–15)
BUN: 24 mg/dL — ABNORMAL HIGH (ref 8–23)
CO2: 22 mmol/L (ref 22–32)
Calcium: 9.1 mg/dL (ref 8.9–10.3)
Chloride: 99 mmol/L (ref 98–111)
Creatinine, Ser: 1.84 mg/dL — ABNORMAL HIGH (ref 0.44–1.00)
GFR, Estimated: 28 mL/min — ABNORMAL LOW (ref >60)
Glucose, Bld: 199 mg/dL — ABNORMAL HIGH (ref 70–99)
Potassium: 4.4 mmol/L (ref 3.5–5.1)
Sodium: 129 mmol/L — ABNORMAL LOW (ref 135–145)

## 2022-08-13 MED ORDER — HYDROMORPHONE HCL 2 MG PO TABS: 2.0000 mg | ORAL_TABLET | ORAL | 0 refills | Status: DC | PRN

## 2022-08-13 MED ORDER — NICOTINE 21 MG/24HR TD PT24: 21.0000 mg | MEDICATED_PATCH | Freq: Every day | TRANSDERMAL | 0 refills | Status: DC

## 2022-08-13 MED ORDER — FLUCONAZOLE 100 MG PO TABS: 100.0000 mg | ORAL_TABLET | Freq: Every day | ORAL | Status: DC

## 2022-08-13 MED ORDER — FLUCONAZOLE 150 MG PO TABS
150.0000 mg | ORAL_TABLET | Freq: Once | ORAL | Status: AC
  Administered 2022-08-13: 150 mg via ORAL
  Filled 2022-08-13: qty 1

## 2022-08-13 MED ORDER — FLUCONAZOLE 150 MG PO TABS
150.0000 mg | ORAL_TABLET | ORAL | 0 refills | Status: AC
  Filled 2022-08-13: qty 4, 28d supply, fill #0

## 2022-08-13 MED ORDER — CYCLOBENZAPRINE HCL 10 MG PO TABS: 10.0000 mg | ORAL_TABLET | Freq: Three times a day (TID) | ORAL | 0 refills | Status: DC | PRN

## 2022-08-13 MED ORDER — HYDROMORPHONE HCL 2 MG PO TABS: 2.0000 mg | ORAL_TABLET | ORAL | Status: DC | PRN

## 2022-08-13 MED ORDER — ORITAVANCIN DIPHOSPHATE 400 MG IV SOLR
1200.0000 mg | Freq: Once | INTRAVENOUS | Status: AC
  Administered 2022-08-13: 1200 mg via INTRAVENOUS
  Filled 2022-08-13: qty 120

## 2022-08-13 NOTE — Progress Notes (Signed)
Subjective: The patient is alert and pleasant.  She has some back pain last night requiring morphine.  She wants to go home.  She thinks she has a yeast infection.  Her daughter is at the bedside.  Objective: Vital signs in last 24 hours: Temp:  [97.6 F (36.4 C)-98.5 F (36.9 C)] 97.6 F (36.4 C) (02/27 0315) Pulse Rate:  [70-83] 70 (02/27 0315) Resp:  [18-20] 20 (02/27 0315) BP: (135-199)/(55-110) 199/68 (02/27 0315) SpO2:  [97 %-100 %] 100 % (02/27 0315) Estimated body mass index is 25.27 kg/m as calculated from the following:   Height as of this encounter: 5' 6.5" (1.689 m).   Weight as of this encounter: 72.1 kg.   Intake/Output from previous day: 02/26 0701 - 02/27 0700 In: 480 [P.O.:480] Out: -  Intake/Output this shift: No intake/output data recorded.  Physical exam patient is alert and pleasant.  Her strength is normal.  Her dressing has a small blood stain.  Lab Results: No results for input(s): "WBC", "HGB", "HCT", "PLT" in the last 72 hours. BMET Recent Labs    08/13/22 0034  NA 129*  K 4.4  CL 99  CO2 22  GLUCOSE 199*  BUN 24*  CREATININE 1.84*  CALCIUM 9.1    Studies/Results: [name] EKG SITE RITE  Result Date: 08/12/2022 If Site Rite image not attached, placement could not be confirmed due to current cardiac rhythm.   Assessment/Plan: Wound infection: We are awaiting insurance approval for antibiotics.  Hopefully she can get home today.  Will add Diflucan.  We will try Dilaudid for pain.  I have answered all their questions.  LOS: 5 days     [name] 08/13/2022, 7:59 AM     Patient ID: Mckenzie Lawrence, female   DOB: 75-01-1946, 77 y.o.   MRN: [id number]

## 2022-08-13 NOTE — TOC Transition Note (Addendum)
Transition of Care (TOC) - CM/SW Discharge Note Marvetta Gibbons RN,BSN Transitions of Care Unit 4NP (Non Trauma)- RN Case Manager See Treatment Team for direct Phone #   Patient Details  Name: Mckenzie Lawrence MRN: [id number] Date of Birth: [birth year]  Transition of Care [facility]) CM/SW Contact:  Dawayne Patricia, RN Phone Number: 08/13/2022, 2:04 PM   Clinical Narrative:    Follow up done with pt's daughter at bedside to confirm transition plans, Per ID pt will do first Dalavancin infusion at [facility] next week on March 5- time 9am at Day Surgery.  Discussed with daughter that Latricia Heft has agreed to RN referral for future IV abx needs for remaining IV infusion needs on 3/12 and 3/19 should pt want to do them at home instead of going to infusion clinic. Daughter to follow up regarding cost (still unable to access system for cost) and will assist pt with decision regarding completing infusion at [facility] vs with [facility] visits.   Daughter to transport home after pt finishes Current IV infusion this afternoon.   CM has spoken with Amy liaison at [facility] to confirm that they will resume HHPT and add HHRN for home IV infusion needs for weekly Dalavancin needs starting 3/12 should pt decide she wants to do infusion at home (infusion takes about 1hr). Latricia Heft will f/u with pt/daughter.   Orders for [facility] needs have been placed.   47- received msg from RN that pt would like rollator for home- order has been placed. CM made TC to daughter to discuss delivery- they are requesting delivery to the home as pt is ready to leave. Per daughter they do not have a preference on provider- Call made to [place] for DME referral- [employer] to deliver rollator to home this evening.   Final next level of care: Home w Home Health Services Barriers to Discharge: Barriers Resolved   Patient Goals and CMS Choice CMS Medicare.gov Compare Post Acute Care list provided to:: Patient Choice offered to / list presented  to : Patient, Adult Children  Discharge Placement                 Home w/ [facility]        Discharge Plan and Services Additional resources added to the After Visit Summary for     Discharge Planning Services: CM Consult Post Acute Care Choice: Durable Medical Equipment, Home Health, Resumption of Svcs/PTA Provider          DME Arranged: N/A DME Agency: NA       HH Arranged: RN, PT, IV Antibiotics HH Agency: [place] Date [facility] Agency Contacted: 08/12/22 Time HH Agency Contacted: 1400 Representative spoke with at [place]: Amy  Social Determinants of Health ([place]) Interventions SDOH Screenings   Food Insecurity: No Food Insecurity (08/08/2022)  Housing: Low Risk  (08/08/2022)  Transportation Needs: No Transportation Needs (08/08/2022)  Utilities: Not At Risk (08/08/2022)  Tobacco Use: High Risk (08/09/2022)     Readmission Risk Interventions    08/13/2022    1:04 PM 11/10/2021    2:49 PM  Readmission Risk Prevention Plan  Transportation Screening Complete Complete  PCP or Specialist Appt within 5-7 Days Complete   PCP or Specialist Appt within 3-5 Days  Complete  Home Care Screening Complete   Medication Review (RN CM) Complete   HRI or Home Care Consult  Complete  Social Work Consult for [place] Planning/Counseling  Complete  Palliative Care Screening  Not Applicable  Medication Review (RN  Care Manager)  Complete

## 2022-08-13 NOTE — Progress Notes (Signed)
Patient has discharged home. Made patient's nurse aware she needs to be removed from the system.  Gomez Cleverly SWOT RN

## 2022-08-13 NOTE — Discharge Instructions (Signed)
Wound Care Keep incision covered for two days. You may remove the Honeycomb dressing in two days. Leave steri-strips on back.  They will fall off by themselves. Do not put any creams, lotions, or ointments on incision. You are fine to shower. Let water run over incision and pat dry.  Activity Walk each and every day, increasing distance each day. No lifting greater than 5 lbs.  Avoid excessive back motion. No driving for 2 weeks; may ride as a passenger locally.  Diet Resume your normal diet.   Return to Work Will be discussed at your follow up appointment.  Call Your Doctor If Any of These Occur Redness, drainage, or swelling at the wound.  Temperature greater than 101 degrees. Severe pain not relieved by pain medication. Incision starts to come apart.  Follow Up Appt Call [phone] today for appointment in 3-4 weeks if you don't already have one or for any problems.

## 2022-08-13 NOTE — Progress Notes (Signed)
Discharge instructions reviewed with pt and daughter--all instructions read to pt and daughter as written per MD. Questions answered as pertaining to discharge instructions.  Daughter states Dr Arnoldo Morale wants pt to come back to office next week for suture removal on 08/20/22, same day as medication infusion at [facility]. No appointment time set up for suture removal on the d/c instruction.  [name] daughter advised to call Dr Arnoldo Morale' office and schedule a time for that would work around the time of the pt's infusion and time when she can take her to the appointment.  Copy of instructions given to pt/daughter.   TOC pharmacy filling one script (Fluconazole) and will deliver to the pt's room. Other scripts sent to pt's home pharmacy and advised to pick up. Script for pain medication hydromorphone did not print out or get sent to a pharmacy.  [name] primary RN Eli reached out to Dr Arnoldo Morale and his NP, Viona Gilmore about the script. Waiting for response and where the script will be sent to advise the pt and daughter.   Pt to be provided with a honeycomb dressing as daughter requested to be able to change tomorrow if the current dressing comes off or gets wet.

## 2022-08-13 NOTE — Progress Notes (Signed)
[facility] for Infectious Disease  Date of Admission:  08/08/2022           Reason for visit: Follow up on back infection   ASSESSMENT:    77 y.o. female admitted with:  Lumbar surgical site infection: Patient with prior history of lumbar spine surgeries and most recent procedure on 07/08/2022 with instrumentation.  Complicated by wound drainage and taken to the OR 08/08/2022 for incision and drainage.  Infection does not appear to track deep below the fascia or involve the hardware per the operative note.  Cultures have grown MRSA and patient is stable on daptomycin.  She is currently getting dose of oritavancin for discharge. Chronic kidney disease: Creatinine is 1.8 which appears consistent with baseline.  Creatinine clearance is approximately 25.6.  RECOMMENDATIONS:    Finish oritavancin today then stable for DC Send home with weekly Diflucan Will arrange for weekly dalbavancin infusion at [facility] starting 3/5 and continuing 3/12 and 3/19 to complete therapy from date of her surgery Will sign off, please call as needed Follow up with me on 3/20   Principal Problem:   Wound infection complicating hardware ([place]) Active Problems:   Type 2 diabetes mellitus with other specified complication (HCC)   Stage 4 chronic kidney disease ([place])    MEDICATIONS:    Scheduled Meds:  calcitRIOL  0.25 mcg Oral QHS   Chlorhexidine Gluconate Cloth  6 each Topical Q0600   docusate sodium  100 mg Oral BID   insulin aspart  0-20 Units Subcutaneous Q4H   losartan  100 mg Oral Daily   metoprolol succinate  100 mg Oral QHS   mupirocin ointment  1 Application Nasal BID   nicotine  21 mg Transdermal Daily   pantoprazole  40 mg Oral BID   sodium chloride flush  3 mL Intravenous Q12H   Continuous Infusions:  sodium chloride 250 mL (08/08/22 2043)   DAPTOmycin (CUBICIN) 600 mg in sodium chloride 0.9 % IVPB 600 mg (08/11/22 2038)   Oritavancin Diphosphate (ORBACTIV) 1,200 mg in dextrose 5 %  IVPB 1,200 mg (08/13/22 0931)   PRN Meds:.acetaminophen **OR** acetaminophen, bisacodyl, cyclobenzaprine, diazepam, gabapentin, HYDROmorphone, menthol-cetylpyridinium **OR** phenol, morphine injection, ondansetron **OR** ondansetron (ZOFRAN) IV, oxyCODONE, oxyCODONE, sodium chloride flush, sucralfate, zolpidem  SUBJECTIVE:   24 hour events:  No acute events She is ready to go home She is getting oritavancin infusion currently  No new complaints.  Review of Systems  All other systems reviewed and are negative.     OBJECTIVE:   Blood pressure (!) 164/67, pulse 72, temperature 98.2 F (36.8 C), temperature source Oral, resp. rate 20, height 5' 6.5" (1.689 m), weight 72.1 kg, SpO2 96 %. Body mass index is 25.27 kg/m.  Physical Exam Constitutional:      Appearance: Normal appearance.  HENT:     Head: Normocephalic and atraumatic.  Eyes:     Extraocular Movements: Extraocular movements intact.     Conjunctiva/sclera: Conjunctivae normal.  Abdominal:     General: There is no distension.     Palpations: Abdomen is soft.  Musculoskeletal:     Cervical back: Normal range of motion and neck supple.  Skin:    General: Skin is warm and dry.  Neurological:     General: No focal deficit present.     Mental Status: She is alert and oriented to person, place, and time.  Psychiatric:        Mood and Affect: Mood normal.  Behavior: Behavior normal.      Lab Results: Lab Results  Component Value Date   WBC 11.1 (H) 08/08/2022   HGB 9.1 (L) 08/08/2022   HCT 27.0 (L) 08/08/2022   MCV 91.8 08/08/2022   PLT 392 08/08/2022    Lab Results  Component Value Date   NA 129 (L) 08/13/2022   K 4.4 08/13/2022   CO2 22 08/13/2022   GLUCOSE 199 (H) 08/13/2022   BUN 24 (H) 08/13/2022   CREATININE 1.84 (H) 08/13/2022   CALCIUM 9.1 08/13/2022   GFRNONAA 28 (L) 08/13/2022   GFRAA 43 (L) 05/06/2018    Lab Results  Component Value Date   ALT 11 08/08/2022   AST 19 08/08/2022    ALKPHOS 91 08/08/2022   BILITOT 0.5 08/08/2022    No results found for: "CRP"  No results found for: "ESRSEDRATE"   I have reviewed the micro and lab results in Epic.  Imaging: [name] EKG SITE RITE  Result Date: 08/12/2022 If [facility] image not attached, placement could not be confirmed due to current cardiac rhythm.    Imaging independently reviewed in Epic.    Raynelle Highland for Infectious Disease [facility] [phone] pager 08/13/2022, 9:53 AM  I have personally spent 50 minutes involved in face-to-face and non-face-to-face activities for this patient on the day of the visit. Professional time spent includes the following activities: Preparing to see the patient (review of tests), Obtaining and/or reviewing separately obtained history (admission/discharge record), Performing a medically appropriate examination and/or evaluation , Ordering medications/tests/procedures, referring and communicating with other health care professionals, Documenting clinical information in the EMR, Independently interpreting results (not separately reported), Communicating results to the patient/family/caregiver, Counseling and educating the patient/family/caregiver and Care coordination (not separately reported).

## 2022-08-13 NOTE — Discharge Summary (Signed)
Physician Discharge Summary     Providing Compassionate, Quality Care - Together   Patient ID: Mckenzie Lawrence MRN: [id number] DOB/AGE: 1946/04/20 77 y.o.  Admit date: 08/08/2022 Discharge date: 08/13/2022  Admission Diagnoses: Wound infection  Discharge Diagnoses:  Principal Problem:   Wound infection complicating hardware [facility]) Active Problems:   Type 2 diabetes mellitus with other specified complication ([place])   Stage 4 chronic kidney disease ([place])   Discharged Condition: good  Hospital Course: Patient underwent an incision and drainage of her lumbar wound by Dr. Arnoldo Morale on 08/08/2022. She was admitted to the hospital following recovery from anesthesia in the PACU. Her postoperative course has been uncomplicated. Her wound culture grew methicillin resistant Staphylococcus aureus.She is discharging home and will receive weekly dalbavancin at [facility]. She has worked with both physical and occupational therapies who feel the patient is ready for discharge home with home health PT. She is ambulating with the aid of a walker. She is tolerating a normal diet. She is not having any bowel or bladder dysfunction. Her pain is well-controlled with oral pain medication. She is ready for discharge home with home health.   Consults: PT/OT/TOC  Significant Diagnostic Studies: microbiology: wound culture: positive for gram positive cocci   Results for orders placed or performed during the hospital encounter of 08/08/22 (from the past 24 hour(s))  Glucose, capillary     Status: Abnormal   Collection Time: 08/13/22 12:03 AM  Result Value Ref Range   Glucose-Capillary 216 (H) 70 - 99 mg/dL  Basic metabolic panel     Status: Abnormal   Collection Time: 08/13/22 12:34 AM  Result Value Ref Range   Sodium 129 (L) 135 - 145 mmol/L   Potassium 4.4 3.5 - 5.1 mmol/L   Chloride 99 98 - 111 mmol/L   CO2 22 22 - 32 mmol/L   Glucose, Bld 199 (H) 70 - 99 mg/dL   BUN 24 (H) 8 - 23 mg/dL   Creatinine, Ser  1.84 (H) 0.44 - 1.00 mg/dL   Calcium 9.1 8.9 - 10.3 mg/dL   GFR, Estimated 28 (L) >60 mL/min   Anion gap 8 5 - 15  Glucose, capillary     Status: Abnormal   Collection Time: 08/13/22  8:14 AM  Result Value Ref Range   Glucose-Capillary 140 (H) 70 - 99 mg/dL  Glucose, capillary     Status: Abnormal   Collection Time: 08/13/22 11:47 AM  Result Value Ref Range   Glucose-Capillary 231 (H) 70 - 99 mg/dL  Glucose, capillary     Status: Abnormal   Collection Time: 08/13/22  3:36 PM  Result Value Ref Range   Glucose-Capillary 125 (H) 70 - 99 mg/dL     Treatments: antibiotics: oritavancin diphosphate and surgery: Incision and drainage of lumbar wound   Discharge Exam: Blood pressure (!) 164/67, pulse 72, temperature 98.2 F (36.8 C), temperature source Oral, resp. rate 18, height 5' 6.5" (1.689 m), weight 72.1 kg, SpO2 96 %.  Per report: Alert and oriented x 4 MAE, Strength and sensation intact Incision is covered with Honeycomb dressing; Dressing is clean, dry, and intact   Disposition: Discharge disposition: 01-Home or Self Care       Discharge Instructions     Call MD for:  difficulty breathing, headache or visual disturbances   Complete by: As directed    Call MD for:  hives   Complete by: As directed    Call MD for:  persistant nausea and vomiting   Complete by:  As directed    Call MD for:  redness, tenderness, or signs of infection (pain, swelling, redness, odor or green/yellow discharge around incision site)   Complete by: As directed    Call MD for:  severe uncontrolled pain   Complete by: As directed    Call MD for:  temperature >100.4   Complete by: As directed    Diet - low sodium heart healthy   Complete by: As directed    Face-to-face encounter (required for Medicare/Medicaid patients)   Complete by: As directed    I Patricia Nettle certify that this patient is under my care and that I, or a nurse practitioner or physician's assistant working with me, had  a face-to-face encounter that meets the physician face-to-face encounter requirements with this patient on 08/13/2022. The encounter with the patient was in whole, or in part for the following medical condition(s) which is the primary reason for home health care (List medical condition):  postoperative wound infection   The encounter with the patient was in whole, or in part, for the following medical condition, which is the primary reason for home health care: Postoperative wound infection   I certify that, based on my findings, the following services are medically necessary home health services: Physical therapy   Reason for Medically Necessary Home Health Services: Therapy- Therapeutic Exercises to Increase Strength and Endurance   My clinical findings support the need for the above services: Unable to leave home safely without assistance and/or assistive device   Further, I certify that my clinical findings support that this patient is homebound due to: Unable to leave home safely without assistance   For home use only DME 4 wheeled rolling walker with seat   Complete by: As directed    Patient needs a walker to treat with the following condition: Lumbar stenosis   Home Health   Complete by: As directed    To provide the following care/treatments: PT   Increase activity slowly   Complete by: As directed    No wound care   Complete by: As directed    Remove dressing in 48 hours   Complete by: As directed       Allergies as of 08/13/2022       Reactions   Iodine Anaphylaxis, Other (See Comments)   Pt states that she is allergic to ingested iodine only, okay for betadine.     Shellfish Allergy Anaphylaxis   Codeine Nausea And Vomiting   Morphine Sulfate Nausea And Vomiting   Irbesartan Other (See Comments)    Unknown  (Avapro)   Sulfa Antibiotics Other (See Comments)   Fever         Medication List     STOP taking these medications    tiZANidine 2 MG tablet Commonly known  as: ZANAFLEX       TAKE these medications    acetaminophen 500 MG tablet Commonly known as: TYLENOL Take 1,000 mg by mouth every 6 (six) hours as needed for moderate pain.   aspirin EC 81 MG tablet Take 81 mg by mouth at bedtime. Swallow whole.   BD Insulin Syringe U/F 31G X 5/16" 1 ML Misc Generic drug: Insulin Syringe-Needle U-100 USE 1 SYRINGE AS DIRECTED   calcitRIOL 0.25 MCG capsule Commonly known as: ROCALTROL Take 0.25 mcg by mouth at bedtime.   clopidogrel 75 MG tablet Commonly known as: PLAVIX Take 75 mg by mouth at bedtime.   cyclobenzaprine 10 MG tablet Commonly known as: FLEXERIL Take  1 tablet (10 mg total) by mouth 3 (three) times daily as needed for muscle spasms.   D-MANNOSE PO Take 2 tablets by mouth at bedtime.   diazepam 10 MG tablet Commonly known as: VALIUM Take 10 mg by mouth every 6 (six) hours as needed for anxiety.   diphenhydrAMINE 25 MG tablet Commonly known as: BENADRYL Take 50 mg by mouth at bedtime as needed for sleep.   docusate sodium 100 MG capsule Commonly known as: COLACE Take 1 capsule (100 mg total) by mouth 2 (two) times daily. What changed:  when to take this reasons to take this   estradiol 0.1 MG/GM vaginal cream Commonly known as: ESTRACE Place 1 Applicatorful vaginally 2 (two) times a week.   feeding supplement Liqd Take 1 Container by mouth 2 (two) times daily between meals.   fluconazole 150 MG tablet Commonly known as: DIFLUCAN Take 1 tablet (150 mg total) by mouth once a week for 4 doses. Notes to patient: YOU HAD A DOSE TODAY 08/13/22 TUESDAY IN THE HOSPITAL   gabapentin 100 MG capsule Commonly known as: Neurontin Take 1 capsule (100 mg total) by mouth 3 (three) times daily. Start with 1 capsule at bedtime and advance to 3 capsules as tolerated. What changed:  when to take this reasons to take this additional instructions   Gemtesa 75 MG Tabs Generic drug: Vibegron Take 75 mg by mouth at bedtime.    HYDROmorphone 2 MG tablet Commonly known as: DILAUDID Take 1-2 tablets (2-4 mg total) by mouth every 4 (four) hours as needed for severe pain.   hydrOXYzine 50 MG tablet Commonly known as: ATARAX Take 50 mg by mouth at bedtime.   insulin detemir 100 UNIT/ML injection Commonly known as: LEVEMIR Inject 0.3-0.6 mLs (30-60 Units total) into the skin daily. What changed:  how much to take when to take this   losartan 100 MG tablet Commonly known as: COZAAR Take 1 tablet (100 mg total) by mouth daily. What changed: when to take this   metoprolol succinate 100 MG 24 hr tablet Commonly known as: TOPROL-XL Take 100 mg by mouth at bedtime.   nicotine 21 mg/24hr patch Commonly known as: NICODERM CQ - dosed in mg/24 hours Place 1 patch (21 mg total) onto the skin daily. Start taking on: August 14, 2022   nitrofurantoin (macrocrystal-monohydrate) 100 MG capsule Commonly known as: MACROBID Take 100 mg by mouth at bedtime.   ondansetron 4 MG tablet Commonly known as: ZOFRAN Take 1 tablet (4 mg total) by mouth every 6 (six) hours as needed for nausea or vomiting.   OneTouch Ultra test strip Generic drug: glucose blood 4 (four) times daily.   oxyCODONE 5 MG immediate release tablet Commonly known as: Oxy IR/ROXICODONE Take 1-2 tablets (5-10 mg total) by mouth every 4 (four) hours as needed for moderate pain ((score 4 to 6)).   pantoprazole 40 MG tablet Commonly known as: PROTONIX Take 40 mg by mouth 2 (two) times daily.   PRESERVISION AREDS 2+MULTI VIT PO Take 1 capsule by mouth daily.   sennosides-docusate sodium 8.6-50 MG tablet Commonly known as: SENOKOT-S Take 1 tablet by mouth daily as needed for constipation.   sucralfate 1 g tablet Commonly known as: CARAFATE Take 1 g by mouth 2 (two) times daily as needed (acid reflux).   zolpidem 10 MG tablet Commonly known as: AMBIEN Take 10 mg by mouth at bedtime as needed for sleep.  Durable Medical  Equipment  (From admission, onward)           Start     Ordered   08/13/22 0000  For home use only DME 4 wheeled rolling walker with seat       Question:  Patient needs a walker to treat with the following condition  Answer:  Lumbar stenosis   08/13/22 [place]. Follow up.   Why: Latricia Heft)- HHPT to resume (added [facility] for infusion needs if needed for Dalavancin on 3/12 and 3/19) you can let them know if you are going to need the [facility] or are going to do the infusions at [facility] information: [place] [address] [phone]         Mignon Pine, DO Follow up on 09/04/2022.   Specialties: Infectious Diseases, Internal Medicine Why: Hospital Discharge Follow Up @ 9:30 am. Please call [phone] if you need to reschedule. Contact information: [address] [place] [address] [phone]         Newman Pies, MD. Go on 09/20/2022.   Specialty: Neurosurgery Why: First post op appointment with x-rays is n 09/20/2022 at 12:00 PM Contact information: [address] [address] [phone]         [name]. Go on 08/20/2022.   Why: for Dalavancin infusion- time-9:00am- they will make next appointments if needed for next 2 infusions (or you may have [facility] RN do them at home for 3/12 and 3/19 doses) Contact information: [place] [id number] [place]                Signed: Viona Gilmore, [place], AGNP-C Nurse Practitioner  [facility]. [address] [place], [place], [place] [address] P: [phone]    F: [phone]  08/13/2022, 4:14 PM

## 2022-08-15 DIAGNOSIS — N3020 Other chronic cystitis without hematuria: Secondary | ICD-10-CM | POA: Diagnosis not present

## 2022-08-16 DIAGNOSIS — E1122 Type 2 diabetes mellitus with diabetic chronic kidney disease: Secondary | ICD-10-CM | POA: Diagnosis not present

## 2022-08-16 DIAGNOSIS — I129 Hypertensive chronic kidney disease with stage 1 through stage 4 chronic kidney disease, or unspecified chronic kidney disease: Secondary | ICD-10-CM | POA: Diagnosis not present

## 2022-08-16 DIAGNOSIS — I2510 Atherosclerotic heart disease of native coronary artery without angina pectoris: Secondary | ICD-10-CM | POA: Diagnosis not present

## 2022-08-16 DIAGNOSIS — M1990 Unspecified osteoarthritis, unspecified site: Secondary | ICD-10-CM | POA: Diagnosis not present

## 2022-08-16 DIAGNOSIS — D649 Anemia, unspecified: Secondary | ICD-10-CM | POA: Diagnosis not present

## 2022-08-16 DIAGNOSIS — N184 Chronic kidney disease, stage 4 (severe): Secondary | ICD-10-CM | POA: Diagnosis not present

## 2022-08-16 DIAGNOSIS — Z981 Arthrodesis status: Secondary | ICD-10-CM | POA: Diagnosis not present

## 2022-08-16 DIAGNOSIS — J449 Chronic obstructive pulmonary disease, unspecified: Secondary | ICD-10-CM | POA: Diagnosis not present

## 2022-08-16 DIAGNOSIS — Z4789 Encounter for other orthopedic aftercare: Secondary | ICD-10-CM | POA: Diagnosis not present

## 2022-08-16 DIAGNOSIS — Z794 Long term (current) use of insulin: Secondary | ICD-10-CM | POA: Diagnosis not present

## 2022-08-16 DIAGNOSIS — T8189XA Other complications of procedures, not elsewhere classified, initial encounter: Secondary | ICD-10-CM | POA: Diagnosis not present

## 2022-08-19 MED ORDER — DEXTROSE 5 % IV SOLN
500.0000 mg | INTRAVENOUS | Status: DC
  Filled 2022-08-19: qty 25

## 2022-08-20 ENCOUNTER — Ambulatory Visit
Admit: 2022-08-20 | Discharge: 2022-08-20 | Disposition: A | Discharge status: Home / Self Care | Payer: PPO | Attending: Internal Medicine | Admitting: Internal Medicine

## 2022-08-20 VITALS — BP 190/64 | HR 75 | Temp 97.7°F | Resp 16 | Ht 66.25 in | Wt 159.0 lb

## 2022-08-20 DIAGNOSIS — Z01818 Encounter for other preprocedural examination: Secondary | ICD-10-CM | POA: Diagnosis not present

## 2022-08-20 DIAGNOSIS — T847XXD Infection and inflammatory reaction due to other internal orthopedic prosthetic devices, implants and grafts, subsequent encounter: Secondary | ICD-10-CM

## 2022-08-20 DIAGNOSIS — T8189XA Other complications of procedures, not elsewhere classified, initial encounter: Secondary | ICD-10-CM | POA: Diagnosis present

## 2022-08-20 MED ORDER — DEXTROSE 5 % IV SOLN
500.0000 mg | Freq: Once | INTRAVENOUS | Status: AC
  Administered 2022-08-20: 500 mg via INTRAVENOUS
  Filled 2022-08-20: qty 25

## 2022-08-22 ENCOUNTER — Other Ambulatory Visit: Payer: Self-pay | Admitting: Gastroenterology

## 2022-08-22 DIAGNOSIS — N3000 Acute cystitis without hematuria: Secondary | ICD-10-CM | POA: Diagnosis not present

## 2022-08-23 LAB — GLUCOSE, CAPILLARY
Glucose-Capillary: 85 mg/dL (ref 70–99)
Glucose-Capillary: 80 mg/dL (ref 70–99)
Glucose-Capillary: 110 mg/dL — ABNORMAL HIGH (ref 70–99)
Glucose-Capillary: 213 mg/dL — ABNORMAL HIGH (ref 70–99)
Glucose-Capillary: 44 mg/dL — CL (ref 70–99)
Glucose-Capillary: 45 mg/dL — ABNORMAL LOW (ref 70–99)
Glucose-Capillary: 90 mg/dL (ref 70–99)
Glucose-Capillary: 271 mg/dL — ABNORMAL HIGH (ref 70–99)
Glucose-Capillary: 196 mg/dL — ABNORMAL HIGH (ref 70–99)
Glucose-Capillary: 117 mg/dL — ABNORMAL HIGH (ref 70–99)

## 2022-08-26 ENCOUNTER — Encounter (HOSPITAL_COMMUNITY): Payer: Self-pay | Admitting: Gastroenterology

## 2022-08-27 ENCOUNTER — Ambulatory Visit
Admission: RE | Admit: 2022-08-27 | Discharge: 2022-08-27 | Disposition: A | Discharge status: Home / Self Care | Payer: PPO | Source: Ambulatory Visit | Attending: Infectious Diseases | Admitting: Infectious Diseases

## 2022-08-27 ENCOUNTER — Other Ambulatory Visit: Payer: Self-pay | Admitting: *Deleted

## 2022-08-27 DIAGNOSIS — T8463XA Infection and inflammatory reaction due to internal fixation device of spine, initial encounter: Secondary | ICD-10-CM | POA: Insufficient documentation

## 2022-08-27 DIAGNOSIS — Y828 Other medical devices associated with adverse incidents: Secondary | ICD-10-CM | POA: Insufficient documentation

## 2022-08-27 MED ORDER — DEXTROSE 5 % IV SOLN
500.0000 mg | INTRAVENOUS | Status: DC
  Administered 2022-08-27: 500 mg via INTRAVENOUS
  Filled 2022-08-27: qty 25

## 2022-08-28 ENCOUNTER — Ambulatory Visit (HOSPITAL_COMMUNITY)
Admission: RE | Admit: 2022-08-28 | Discharge: 2022-08-28 | Disposition: A | Discharge status: Home / Self Care | Payer: PPO | Source: Ambulatory Visit | Attending: Gastroenterology | Admitting: Gastroenterology

## 2022-08-28 ENCOUNTER — Ambulatory Visit (HOSPITAL_COMMUNITY): Payer: PPO | Admitting: Anesthesiology

## 2022-08-28 ENCOUNTER — Encounter (HOSPITAL_COMMUNITY)
Admission: RE | Disposition: A | Discharge status: Home / Self Care | Payer: Self-pay | Source: Ambulatory Visit | Attending: Gastroenterology

## 2022-08-28 ENCOUNTER — Ambulatory Visit (HOSPITAL_BASED_OUTPATIENT_CLINIC_OR_DEPARTMENT_OTHER): Payer: PPO | Admitting: Anesthesiology

## 2022-08-28 ENCOUNTER — Other Ambulatory Visit: Payer: Self-pay

## 2022-08-28 ENCOUNTER — Encounter (HOSPITAL_COMMUNITY): Payer: Self-pay | Admitting: Gastroenterology

## 2022-08-28 DIAGNOSIS — I2510 Atherosclerotic heart disease of native coronary artery without angina pectoris: Secondary | ICD-10-CM | POA: Diagnosis not present

## 2022-08-28 DIAGNOSIS — R1310 Dysphagia, unspecified: Secondary | ICD-10-CM | POA: Diagnosis not present

## 2022-08-28 DIAGNOSIS — N189 Chronic kidney disease, unspecified: Secondary | ICD-10-CM | POA: Diagnosis not present

## 2022-08-28 DIAGNOSIS — E1122 Type 2 diabetes mellitus with diabetic chronic kidney disease: Secondary | ICD-10-CM | POA: Insufficient documentation

## 2022-08-28 DIAGNOSIS — Z833 Family history of diabetes mellitus: Secondary | ICD-10-CM | POA: Insufficient documentation

## 2022-08-28 DIAGNOSIS — N1832 Chronic kidney disease, stage 3b: Secondary | ICD-10-CM | POA: Insufficient documentation

## 2022-08-28 DIAGNOSIS — K224 Dyskinesia of esophagus: Secondary | ICD-10-CM

## 2022-08-28 DIAGNOSIS — Z8673 Personal history of transient ischemic attack (TIA), and cerebral infarction without residual deficits: Secondary | ICD-10-CM | POA: Insufficient documentation

## 2022-08-28 DIAGNOSIS — F17200 Nicotine dependence, unspecified, uncomplicated: Secondary | ICD-10-CM | POA: Diagnosis not present

## 2022-08-28 DIAGNOSIS — K2100 Gastro-esophageal reflux disease with esophagitis, without bleeding: Secondary | ICD-10-CM | POA: Insufficient documentation

## 2022-08-28 DIAGNOSIS — F17210 Nicotine dependence, cigarettes, uncomplicated: Secondary | ICD-10-CM | POA: Insufficient documentation

## 2022-08-28 DIAGNOSIS — K2970 Gastritis, unspecified, without bleeding: Secondary | ICD-10-CM | POA: Insufficient documentation

## 2022-08-28 DIAGNOSIS — Z8249 Family history of ischemic heart disease and other diseases of the circulatory system: Secondary | ICD-10-CM | POA: Insufficient documentation

## 2022-08-28 DIAGNOSIS — Z794 Long term (current) use of insulin: Secondary | ICD-10-CM | POA: Insufficient documentation

## 2022-08-28 DIAGNOSIS — D631 Anemia in chronic kidney disease: Secondary | ICD-10-CM

## 2022-08-28 DIAGNOSIS — G4730 Sleep apnea, unspecified: Secondary | ICD-10-CM | POA: Insufficient documentation

## 2022-08-28 DIAGNOSIS — I129 Hypertensive chronic kidney disease with stage 1 through stage 4 chronic kidney disease, or unspecified chronic kidney disease: Secondary | ICD-10-CM

## 2022-08-28 DIAGNOSIS — N184 Chronic kidney disease, stage 4 (severe): Secondary | ICD-10-CM

## 2022-08-28 HISTORY — PX: BOTOX INJECTION: SHX5754

## 2022-08-28 HISTORY — PX: BIOPSY: SHX5522

## 2022-08-28 HISTORY — PX: ESOPHAGOGASTRODUODENOSCOPY (EGD) WITH PROPOFOL: SHX5813

## 2022-08-28 LAB — GLUCOSE, CAPILLARY: Glucose-Capillary: 135 mg/dL — ABNORMAL HIGH (ref 70–99)

## 2022-08-28 SURGERY — ESOPHAGOGASTRODUODENOSCOPY (EGD) WITH PROPOFOL
Anesthesia: General

## 2022-08-28 MED ORDER — PROPOFOL 500 MG/50ML IV EMUL
INTRAVENOUS | Status: AC
  Filled 2022-08-28: qty 50

## 2022-08-28 MED ORDER — PROPOFOL 10 MG/ML IV BOLUS
INTRAVENOUS | Status: DC | PRN
  Administered 2022-08-28 (×2): 20 mg via INTRAVENOUS

## 2022-08-28 MED ORDER — PROPOFOL 500 MG/50ML IV EMUL
INTRAVENOUS | Status: DC | PRN
  Administered 2022-08-28: 125 ug/kg/min via INTRAVENOUS

## 2022-08-28 MED ORDER — SODIUM CHLORIDE (PF) 0.9 % IJ SOLN
INTRAMUSCULAR | Status: DC | PRN
  Administered 2022-08-28: 4 mL via SUBMUCOSAL

## 2022-08-28 MED ORDER — SODIUM CHLORIDE 0.9 % IV SOLN: INTRAVENOUS | Status: DC

## 2022-08-28 MED ORDER — ONABOTULINUMTOXINA 100 UNITS IJ SOLR
INTRAMUSCULAR | Status: AC
  Filled 2022-08-28: qty 100

## 2022-08-28 MED ORDER — SODIUM CHLORIDE (PF) 0.9 % IJ SOLN
INTRAMUSCULAR | Status: AC
  Filled 2022-08-28: qty 10

## 2022-08-28 SURGICAL SUPPLY — 15 items

## 2022-08-28 NOTE — Discharge Instructions (Signed)

## 2022-08-28 NOTE — Op Note (Signed)
[facility] Patient Name: Mckenzie Lawrence Procedure Date: 08/28/2022 MRN: [id number] Attending MD: Otis Brace , MD, [id number] Date of Birth: [birth year] CSN: [id number] Age: 77 Admit Type: Outpatient Procedure:                Upper GI endoscopy Indications:              Dysphagia Providers:                Otis Brace, MD, Jari Favre, Technician Referring MD:              Medicines:                Sedation Administered by an Anesthesia Professional Complications:            No immediate complications. Estimated Blood Loss:     Estimated blood loss was minimal. Procedure:                Pre-Anesthesia Assessment:                           - Prior to the procedure, a History and Physical                            was performed, and patient medications and                            allergies were reviewed. The patient's tolerance of                            previous anesthesia was also reviewed. The risks                            and benefits of the procedure and the sedation                            options and risks were discussed with the patient.                            All questions were answered, and informed consent                            was obtained. Prior Anticoagulants: The patient has                            taken Plavix (clopidogrel), last dose was 5 days                            prior to procedure. ASA Grade Assessment: III - A                            patient with severe systemic disease. After  reviewing the risks and benefits, the patient was                            deemed in satisfactory condition to undergo the                            procedure.                           After obtaining informed consent, the endoscope was                            passed under direct vision. Throughout the                            procedure, the patient's  blood pressure, pulse, and                            oxygen saturations were monitored continuously. The                            [id number] Olympus endoscope was introduced                            through the mouth, and advanced to the second part                            of duodenum. The upper GI endoscopy was                            accomplished without difficulty. The patient                            tolerated the procedure well. Scope In: Scope Out: Findings:      Abnormal motility was noted in the esophagus. There is spasticity of the       esophageal body. The distal esophagus/lower esophageal sphincter is       spastic, but gives up passage to the endoscope. Tertiary peristaltic       waves are noted. Area was successfully injected with 100 units botulinum       toxin.      LA Grade A (one or more mucosal breaks less than 5 mm, not extending       between tops of 2 mucosal folds) esophagitis was found at the       gastroesophageal junction.      Patchy mild inflammation characterized by congestion (edema), erythema       and friability was found in the gastric body, in the gastric antrum and       at the pylorus. Advancing scope from pylorus resulted in mild self       limited oozing of blood from friable mucosa. Biopsies were taken with a       cold forceps for histology.      The cardia and gastric fundus were normal on retroflexion.      The duodenal bulb, first portion of the duodenum and second portion of  the duodenum were normal. Impression:               - Abnormal esophageal motility. Injected with                            botulinum toxin.                           - LA Grade A reflux esophagitis.                           - Gastritis. Biopsied.                           - Normal duodenal bulb, first portion of the                            duodenum and second portion of the duodenum. Moderate Sedation:      Moderate (conscious) sedation  was personally administered by an       anesthesia professional. The following parameters were monitored: oxygen       saturation, heart rate, blood pressure, and response to care. Recommendation:           - Patient has a contact number available for                            emergencies. The signs and symptoms of potential                            delayed complications were discussed with the                            patient. Return to normal activities tomorrow.                            Written discharge instructions were provided to the                            patient.                           - Soft diet.                           - Continue present medications.                           - Await pathology results.                           - Repeat upper endoscopy PRN for retreatment. Procedure Code(s):        --- Professional ---                           [phone], Esophagogastroduodenoscopy, flexible,                            transoral;  with biopsy, single or multiple                           43236, 59, Esophagogastroduodenoscopy, flexible,                            transoral; with directed submucosal injection(s),                            any substance Diagnosis Code(s):        --- Professional ---                           K22.4, Dyskinesia of esophagus                           K21.00, Gastro-esophageal reflux disease with                            esophagitis, without bleeding                           K29.70, Gastritis, unspecified, without bleeding                           R13.10, Dysphagia, unspecified CPT copyright 2022 American Medical Association. All rights reserved. The codes documented in this report are preliminary and upon coder review may  be revised to meet current compliance requirements. Otis Brace, MD Otis Brace, MD 08/28/2022 11:10:11 AM Number of Addenda: 0

## 2022-08-28 NOTE — Anesthesia Postprocedure Evaluation (Signed)
Anesthesia Post Note  Patient: Mckenzie Lawrence  Procedure(s) Performed: ESOPHAGOGASTRODUODENOSCOPY (EGD) WITH PROPOFOL (Bilateral) BOTOX INJECTION (Bilateral) BIOPSY     Patient location during evaluation: PACU Anesthesia Type: MAC Level of consciousness: awake and alert Pain management: pain level controlled Vital Signs Assessment: post-procedure vital signs reviewed and stable Respiratory status: spontaneous breathing, nonlabored ventilation, respiratory function stable and patient connected to nasal cannula oxygen Cardiovascular status: stable and blood pressure returned to baseline Postop Assessment: no apparent nausea or vomiting Anesthetic complications: no  No notable events documented.  Last Vitals:  Vitals:   08/28/22 1119 08/28/22 1120  BP: (!) 185/48 (!) 192/40  Pulse: 67 65  Resp: 18 16  Temp:    SpO2: 100% 100%    Last Pain:  Vitals:   08/28/22 1120  TempSrc:   PainSc: 3                  Shresta Risden

## 2022-08-28 NOTE — Anesthesia Preprocedure Evaluation (Addendum)
Anesthesia Evaluation    Reviewed: Allergy & Precautions, H&P , Patient's Chart, lab work & pertinent test results  History of Anesthesia Complications (+) PONV and history of anesthetic complications  Airway Mallampati: II  TM Distance: >3 FB Neck ROM: Full    Dental no notable dental hx. (+) Teeth Intact, Caps, Dental Advisory Given   Pulmonary sleep apnea , Current Smoker   Pulmonary exam normal breath sounds clear to auscultation       Cardiovascular Exercise Tolerance: Good hypertension, Pt. on medications and Pt. on home beta blockers + CAD  Normal cardiovascular exam Rhythm:Regular Rate:Normal  ECHO 16 - Left ventricle: The cavity size was normal. Systolic function was    vigorous. The estimated ejection fraction was in the range of 65%    to 70%. Wall motion was normal; there were no regional wall    motion abnormalities.  - Aortic valve: Valve area (Vmax): 2.5 cm^2.     Neuro/Psych   Anxiety     CVA, Residual Symptoms  negative psych ROS   GI/Hepatic Neg liver ROS,GERD  Medicated,,  Endo/Other  diabetes, Type 2    Renal/GU CRFRenal disease  negative genitourinary   Musculoskeletal  (+) Arthritis ,    Abdominal   Peds  Hematology  (+) Blood dyscrasia, anemia   Anesthesia Other Findings   Reproductive/Obstetrics negative OB ROS                             Anesthesia Physical Anesthesia Plan  ASA: 3  Anesthesia Plan: General   Post-op Pain Management: Minimal or no pain anticipated   Induction: Intravenous  PONV Risk Score and Plan: 3 and Propofol infusion  Airway Management Planned: Mask, Natural Airway and Simple Face Mask  Additional Equipment: None  Intra-op Plan:   Post-operative Plan:   Informed Consent: I have reviewed the patients History and Physical, chart, labs and discussed the procedure including the risks, benefits and alternatives for the proposed  anesthesia with the patient or authorized representative who has indicated his/her understanding and acceptance.       Plan Discussed with: Anesthesiologist and CRNA  Anesthesia Plan Comments:         Anesthesia Quick Evaluation

## 2022-08-28 NOTE — H&P (Signed)
Primary Care Physician:  Maryland Pink, MD Primary Gastroenterologist:  Dr. Watt Climes  Reason for Consultation: EGD with Botox injection  HPI: Mckenzie Lawrence is a 77 y.o. female with multiple medical comorbidities listed below here for outpatient EGD for Botox injection.  Patient with history of CVA. Plavix currently on hold for procedure.  History of chronic dysphagia to both solids and liquids.  Last EGD with Botox injection was in August 2023 which helped with dysphagia for around 6 to 7 months.  Now having similar symptoms again.  Past Medical History:  Diagnosis Date   Anemia    Arthritis    Bladder incontinence    Broken foot    Cataracts, bilateral    Chronic kidney insufficiency    stage 3b   COPD (chronic obstructive pulmonary disease) (HCC)    wheezing   Coronary artery disease 04/25/2022   in [place]   COVID 2021   very mild case   CVA (cerebral vascular accident) ([place]) 2016   has had 3 strokes, states right side is slightlyweaker than left   Diabetes mellitus    insulin dependent, Type 2   GERD (gastroesophageal reflux disease)    HLD (hyperlipidemia)    Hx of cardiovascular stress test    a. ETT (6/13):  Ex 5:13; no ischemic changes   Hypertension    controlled on meds   Lacunar stroke of left subthalamic region [facility]) 02/2015   Leg pain    left   Lower back pain    Neuromuscular disorder (HCC)    stroke right hand tingling   Orthostatic hypotension    Osteopenia 01/2017   T score -2.0 stable from prior DEXA   Pancreatitis 10/2021   PCOS (polycystic ovarian syndrome)    Personal history of tobacco use, presenting hazards to health 01/09/2015   PONV (postoperative nausea and vomiting)    Sleep apnea    uses CPAP nightly    Past Surgical History:  Procedure Laterality Date   BOTOX INJECTION  01/15/2022   Procedure: BOTOX INJECTION;  Surgeon: Clarene Essex, MD;  Location: [facility];  Service: Gastroenterology;;   Delphina Cahill LIFT Bilateral 11/04/2017   Procedure:  BLEPHAROPLASTY UPPER EYELID W/EXCESS SKIN;  Surgeon: Karle Starch, MD;  Location: [place];  Service: Ophthalmology;  Laterality: Bilateral;  DIABETES-insulin dependent uses CPAP   CARDIAC CATHETERIZATION  20 yrs ago   found nothing   CARPAL TUNNEL RELEASE Bilateral    CATARACT EXTRACTION Bilateral    ELBOW SURGERY Bilateral    ESOPHAGOGASTRODUODENOSCOPY N/A 07/25/2021   Procedure: ESOPHAGOGASTRODUODENOSCOPY (EGD);  Surgeon: Toledo, Benay Pike, MD;  Location: [facility];  Service: Gastroenterology;  Laterality: N/A;  IDDM   ESOPHAGOGASTRODUODENOSCOPY N/A 01/15/2022   Procedure: ESOPHAGOGASTRODUODENOSCOPY (EGD);  Surgeon: Clarene Essex, MD;  Location: [facility];  Service: Gastroenterology;  Laterality: N/A;  botox   FOOT SURGERY     Groin Abscess     HAND SURGERY     KNEE SURGERY Bilateral    LABIAL ABSCESS     LUMBAR LAMINECTOMY/DECOMPRESSION MICRODISCECTOMY Left 05/11/2018   Procedure: LUMBAR LAMINECTOMY/DECOMPRESSION MICRODISCECTOMY 1 LEVEL- L4-5;  Surgeon: [name], MD;  Location: [facility];  Service: Neurosurgery;  Laterality: Left;   LUMBAR LAMINECTOMY/DECOMPRESSION MICRODISCECTOMY Left 05/20/2022   Procedure: MICRODISCECTOMY L3-4;  Surgeon: Newman Pies, MD;  Location: [place];  Service: Neurosurgery;  Laterality: Left;  3C   LUMBAR WOUND DEBRIDEMENT N/A 08/08/2022   Procedure: INCISION AND DRAINAGE OF LUMBAR WOUND;  Surgeon: Newman Pies, MD;  Location: [place];  Service: Neurosurgery;  Laterality: N/A;  3C   OOPHORECTOMY     BSO   PUBO VAG SLING     SHOULDER SURGERY     bilateral arthroscopies   VAGINAL HYSTERECTOMY  1979    Prior to Admission medications   Medication Sig Start Date End Date Taking? Authorizing Provider  acetaminophen (TYLENOL) 500 MG tablet Take 1,000 mg by mouth every 6 (six) hours as needed for moderate pain.   Yes [provider]  aspirin EC 81 MG tablet Take 81 mg by mouth at bedtime. Swallow whole.   Yes [provider]  calcitRIOL (ROCALTROL) 0.25 MCG capsule Take 0.25 mcg by mouth at bedtime. 08/17/21  Yes [provider]  clopidogrel (PLAVIX) 75 MG tablet Take 75 mg by mouth at bedtime.   Yes [provider]  diphenhydrAMINE (BENADRYL) 25 MG tablet Take 50 mg by mouth at bedtime as needed for sleep.   Yes [provider]  docusate sodium (COLACE) 100 MG capsule Take 1 capsule (100 mg total) by mouth 2 (two) times daily. Patient taking differently: Take 100 mg by mouth at bedtime as needed for mild constipation or moderate constipation. 05/21/22  Yes Newman Pies, MD  estradiol (ESTRACE) 0.1 MG/GM vaginal cream Place 1 Applicatorful vaginally 2 (two) times a week. 11/23/21  Yes [provider]  fluconazole (DIFLUCAN) 150 MG tablet Take 1 tablet (150 mg total) by mouth once a week for 4 doses. 08/13/22 09/10/22 Yes Mignon Pine, DO  gabapentin (NEURONTIN) 100 MG capsule Take 1 capsule (100 mg total) by mouth 3 (three) times daily. Start with 1 capsule at bedtime and advance to 3 capsules as tolerated. Patient taking differently: Take 100 mg by mouth daily as needed (pain). 07/11/22 07/11/23 Yes Viona Gilmore D, NP  HYDROmorphone (DILAUDID) 2 MG tablet Take 1-2 tablets (2-4 mg total) by mouth every 4 (four) hours as needed for severe pain. 08/13/22  Yes Viona Gilmore D, NP  hydrOXYzine (ATARAX) 50 MG tablet Take 50 mg by mouth at bedtime.   Yes [provider]  losartan (COZAAR) 100 MG tablet Take 1 tablet (100 mg total) by mouth daily. Patient taking differently: Take 100 mg by mouth at bedtime. 09/26/15  Yes Gladstone Lighter, MD  metoprolol succinate (TOPROL-XL) 100 MG 24 hr tablet Take 100 mg by mouth at bedtime.   Yes [provider]  Multiple Vitamins-Minerals (PRESERVISION AREDS 2+MULTI VIT PO) Take 1 capsule by mouth daily.   Yes [provider]  nitrofurantoin, macrocrystal-monohydrate, (MACROBID) 100 MG capsule Take 100 mg by mouth at  bedtime.   Yes [provider]  ondansetron (ZOFRAN) 4 MG tablet Take 1 tablet (4 mg total) by mouth every 6 (six) hours as needed for nausea or vomiting. 07/11/22  Yes Viona Gilmore D, NP  oxyCODONE (OXY IR/ROXICODONE) 5 MG immediate release tablet Take 1-2 tablets (5-10 mg total) by mouth every 4 (four) hours as needed for moderate pain ((score 4 to 6)). 07/11/22  Yes Viona Gilmore D, NP  pantoprazole (PROTONIX) 40 MG tablet Take 40 mg by mouth 2 (two) times daily. 07/06/21  Yes [provider]  sennosides-docusate sodium (SENOKOT-S) 8.6-50 MG tablet Take 1 tablet by mouth daily as needed for constipation.   Yes [provider]  sucralfate (CARAFATE) 1 g tablet Take 1 g by mouth 2 (two) times daily as needed (acid reflux).   Yes [provider]  Vibegron (GEMTESA) 75 MG TABS Take 75 mg by mouth at bedtime.  Yes [provider]  zolpidem (AMBIEN) 10 MG tablet Take 10 mg by mouth at bedtime as needed for sleep. 12/20/21  Yes [provider]  BD INSULIN SYRINGE U/F 31G X 5/16" 1 ML MISC USE 1 SYRINGE AS DIRECTED 07/10/20   [provider]  cyclobenzaprine (FLEXERIL) 10 MG tablet Take 1 tablet (10 mg total) by mouth 3 (three) times daily as needed for muscle spasms. 08/13/22   Viona Gilmore D, NP  D-MANNOSE PO Take 2 tablets by mouth at bedtime.    [provider]  diazepam (VALIUM) 10 MG tablet Take 10 mg by mouth every 6 (six) hours as needed for anxiety.    [provider]  feeding supplement (BOOST HIGH PROTEIN) LIQD Take 1 Container by mouth 2 (two) times daily between meals.    [provider]  insulin detemir (LEVEMIR) 100 UNIT/ML injection Inject 0.3-0.6 mLs (30-60 Units total) into the skin daily. Patient taking differently: Inject 15 Units into the skin in the morning. 11/12/21   Loletha Grayer, MD  nicotine (NICODERM CQ - DOSED IN MG/24 HOURS) 21 mg/24hr patch Place 1 patch (21 mg total) onto the skin  daily. 08/14/22   Viona Gilmore D, NP  [facility] ULTRA test strip 4 (four) times daily. 07/11/20   [provider]    Scheduled Meds: Continuous Infusions:  sodium chloride 20 mL/hr at 08/28/22 0944   PRN Meds:.  Allergies as of 08/22/2022 - Review Complete 08/20/2022  Allergen Reaction Noted   Iodine Anaphylaxis and Other (See Comments) 08/26/2011   Shellfish allergy Anaphylaxis 03/16/2015   Codeine Nausea And Vomiting 08/26/2011   Morphine sulfate Nausea And Vomiting 09/12/2015   Irbesartan Other (See Comments) 09/12/2015   Sulfa antibiotics Other (See Comments) 08/26/2011    Family History  Problem Relation Age of Onset   Hypertension Mother    Heart disease Mother 62       MI   Diabetes Father    Diabetes Sister    Hypertension Sister    Diabetes Brother    Hypertension Brother    Heart disease Brother 39       CAD   Cancer Sister        Multiple myloma   Diabetes Brother     Social History   Socioeconomic History   Marital status: Married    Spouse name: Not on file   Number of children: 1   Years of education: Not on file   Highest education level: Not on file  Occupational History   Not on file  Tobacco Use   Smoking status: Every Day    Packs/day: 1.50    Years: 50.00    Total pack years: 75.00    Types: Cigarettes   Smokeless tobacco: Never   Tobacco comments:    3ppd x 10 year, then cut back to 1.5pdd since 02/2015  Vaping Use   Vaping Use: Never used  Substance and Sexual Activity   Alcohol use: Never    Alcohol/week: 0.0 standard drinks of alcohol   Drug use: No   Sexual activity: Not Currently    Birth control/protection: Surgical    Comment: Hx Hysterectomy, 1st intercourse 77 yo-Fewer than 5 partners  Other Topics Concern   Not on file  Social History Narrative   Lives at home with husband in a one story home.  Has 1 daughter.     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     Education: some college.  Social  Determinants of Health   Financial Resource Strain: Not on file  Food Insecurity: No Food Insecurity (08/08/2022)   Hunger Vital Sign    Worried About Running Out of Food in the Last Year: Never true    Ran Out of Food in the Last Year: Never true  Transportation Needs: No Transportation Needs (08/08/2022)   PRAPARE - Hydrologist (Medical): No    Lack of Transportation (Non-Medical): No  Physical Activity: Not on file  Stress: Not on file  Social Connections: Not on file  Intimate Partner Violence: Not At Risk (08/08/2022)   Humiliation, Afraid, Rape, and Kick questionnaire    Fear of Current or Ex-Partner: No    Emotionally Abused: No    Physically Abused: No    Sexually Abused: No    Review of Systems: All negative except as stated above in HPI.  Physical Exam: Vital signs: Vitals:   08/28/22 0931  BP: (!) 223/68  Pulse: 71  Resp: 15  Temp: (!) 97.3 F (36.3 C)  SpO2: 97%     General:   Alert,  Well-developed, well-nourished, pleasant and cooperative in NAD Lungs: No visible respiratory distress Heart:  Regular rate and rhythm; no murmurs, clicks, rubs,  or gallops. Abdomen: Soft, nontender, nondistended, bowel sounds present, no peritoneal signs Rectal:  Deferred  GI:  Lab Results: No results for input(s): "WBC", "HGB", "HCT", "PLT" in the last 72 hours. BMET No results for input(s): "NA", "K", "CL", "CO2", "GLUCOSE", "BUN", "CREATININE", "CALCIUM" in the last 72 hours. LFT No results for input(s): "PROT", "ALBUMIN", "AST", "ALT", "ALKPHOS", "BILITOT", "BILIDIR", "IBILI" in the last 72 hours. PT/INR No results for input(s): "LABPROT", "INR" in the last 72 hours.   Studies/Results: No results found.  Impression/Plan: -Esophageal dysphagia .  Dysphagia to both solids and liquids.  Previous improvement with Botox injection. -History of CVA.  Plavix on hold for 5 days.  Recommendations -------------------------- -Proceed for EGD  with Botox injection.  Tachyphylaxis with recurrent Botox injection leading to decreased response over time discussed with the patient.  Risks (bleeding, infection, bowel perforation that could require surgery, sedation-related changes in cardiopulmonary systems), benefits (identification and possible treatment of source of symptoms, exclusion of certain causes of symptoms), and alternatives (watchful waiting, radiographic imaging studies, empiric medical treatment)  were explained to patient/family in detail and patient wishes to proceed.     LOS: 0 days   Otis Brace  MD, FACP 08/28/2022, 10:19 AM  Contact #  [phone]

## 2022-08-28 NOTE — Anesthesia Procedure Notes (Signed)
Procedure Name: MAC Date/Time: 08/28/2022 10:47 AM  Performed by: Niel Hummer, CRNAPre-anesthesia Checklist: Patient identified, Emergency Drugs available, Suction available and Patient being monitored Oxygen Delivery Method: Simple face mask

## 2022-08-28 NOTE — Transfer of Care (Signed)
Immediate Anesthesia Transfer of Care Note  Patient: Mckenzie Lawrence  Procedure(s) Performed: ESOPHAGOGASTRODUODENOSCOPY (EGD) WITH PROPOFOL (Bilateral) BOTOX INJECTION (Bilateral) BIOPSY  Patient Location: PACU  Anesthesia Type:MAC  Level of Consciousness: awake, alert , and oriented  Airway & Oxygen Therapy: Patient Spontanous Breathing and Patient connected to face mask oxygen  Post-op Assessment: Report given to RN, Post -op Vital signs reviewed and stable, and Patient moving all extremities X 4  Post vital signs: Reviewed and stable  Last Vitals:  Vitals Value Taken Time  BP 185/46   Temp    Pulse 72   Resp 12   SpO2 100     Last Pain:  Vitals:   08/28/22 0931  TempSrc: Temporal  PainSc: 4          Complications: No notable events documented.

## 2022-08-29 ENCOUNTER — Telehealth: Payer: Self-pay

## 2022-08-29 LAB — SURGICAL PATHOLOGY

## 2022-08-29 NOTE — Telephone Encounter (Signed)
[facility] Patient Advocate Encounter  Prior Authorization for [facility] has been approved.    PA# [id number] Effective dates: 08/21/22 through 06/17/23    [facility] will continue to follow.  Ileene Patrick, [facility] Patient [facility] Phone: [phone] Fax:  [phone]

## 2022-08-30 ENCOUNTER — Encounter (HOSPITAL_COMMUNITY): Payer: Self-pay | Admitting: Gastroenterology

## 2022-08-30 DIAGNOSIS — N3000 Acute cystitis without hematuria: Secondary | ICD-10-CM | POA: Diagnosis not present

## 2022-09-03 ENCOUNTER — Ambulatory Visit
Admission: RE | Admit: 2022-09-03 | Discharge: 2022-09-03 | Disposition: A | Discharge status: Home / Self Care | Payer: PPO | Source: Ambulatory Visit | Attending: Family Medicine | Admitting: Family Medicine

## 2022-09-03 DIAGNOSIS — L720 Epidermal cyst: Secondary | ICD-10-CM | POA: Diagnosis not present

## 2022-09-03 DIAGNOSIS — L298 Other pruritus: Secondary | ICD-10-CM | POA: Diagnosis not present

## 2022-09-03 DIAGNOSIS — L538 Other specified erythematous conditions: Secondary | ICD-10-CM | POA: Diagnosis not present

## 2022-09-03 DIAGNOSIS — L0390 Cellulitis, unspecified: Secondary | ICD-10-CM | POA: Insufficient documentation

## 2022-09-03 DIAGNOSIS — B078 Other viral warts: Secondary | ICD-10-CM | POA: Diagnosis not present

## 2022-09-03 MED ORDER — DEXTROSE 5 % IV SOLN
500.0000 mg | INTRAVENOUS | Status: DC
  Administered 2022-09-03: 500 mg via INTRAVENOUS
  Filled 2022-09-03: qty 25

## 2022-09-04 ENCOUNTER — Encounter: Payer: Self-pay | Admitting: Internal Medicine

## 2022-09-04 ENCOUNTER — Ambulatory Visit (INDEPENDENT_AMBULATORY_CARE_PROVIDER_SITE_OTHER): Payer: PPO | Admitting: Internal Medicine

## 2022-09-04 ENCOUNTER — Other Ambulatory Visit: Payer: Self-pay

## 2022-09-04 VITALS — BP 195/70 | HR 87 | Temp 97.6°F | Ht 67.0 in | Wt 156.0 lb

## 2022-09-04 DIAGNOSIS — T847XXD Infection and inflammatory reaction due to other internal orthopedic prosthetic devices, implants and grafts, subsequent encounter: Secondary | ICD-10-CM | POA: Diagnosis not present

## 2022-09-04 NOTE — Progress Notes (Signed)
[facility] [facility] for Infectious Disease  CHIEF COMPLAINT:    Follow up for lumbar wound infection  SUBJECTIVE:    Mckenzie Lawrence is a 77 y.o. female with PMHx as below who presents to the clinic for lumbar wound infection.   Patient was admitted to [facility] last month.  She has a history of prior lumbar spine surgeries and recently went to OR for procedure on 07/08/22 with instrumentation.  This was complicated by wound drainage and taken to the OR for washout on 08/08/22.  Per operative note, infection did not appear to track deep below the fascia or involve the hardware.  Cultures grew MRSA.  There was concern regarding management of a PICC line at home.  Thus, antibiotics with weekly dalbavancin was pursued.  She received infusion on 3/5, 3/12, and 3/19 to complete about 4-5 weeks of antibiotics post washout. Today, she reports doing well.  Incision healing without drainage. She had a fall last night when trying to pick a pill up off the ground.  Please see A&P for the details of today's visit and status of the patient's medical problems.   Patient's Medications  New Prescriptions   No medications on file  Previous Medications   ACETAMINOPHEN (TYLENOL) 500 MG TABLET    Take 1,000 mg by mouth every 6 (six) hours as needed for moderate pain.   ASPIRIN EC 81 MG TABLET    Take 81 mg by mouth at bedtime. Swallow whole.   BD INSULIN SYRINGE U/F 31G X 5/16" 1 ML MISC    USE 1 SYRINGE AS DIRECTED   CALCITRIOL (ROCALTROL) 0.25 MCG CAPSULE    Take 0.25 mcg by mouth at bedtime.   CLOPIDOGREL (PLAVIX) 75 MG TABLET    Take 75 mg by mouth at bedtime.   CYCLOBENZAPRINE (FLEXERIL) 10 MG TABLET    Take 1 tablet (10 mg total) by mouth 3 (three) times daily as needed for muscle spasms.   D-MANNOSE PO    Take 2 tablets by mouth at bedtime.   DIAZEPAM (VALIUM) 10 MG TABLET    Take 10 mg by mouth every 6 (six) hours as needed for anxiety.   DIPHENHYDRAMINE (BENADRYL) 25 MG TABLET    Take 50 mg by mouth at  bedtime as needed for sleep.   DOCUSATE SODIUM (COLACE) 100 MG CAPSULE    Take 1 capsule (100 mg total) by mouth 2 (two) times daily.   ESTRADIOL (ESTRACE) 0.1 MG/GM VAGINAL CREAM    Place 1 Applicatorful vaginally 2 (two) times a week.   FEEDING SUPPLEMENT (BOOST HIGH PROTEIN) LIQD    Take 1 Container by mouth 2 (two) times daily between meals.   FLUCONAZOLE (DIFLUCAN) 150 MG TABLET    Take 1 tablet (150 mg total) by mouth once a week for 4 doses.   GABAPENTIN (NEURONTIN) 100 MG CAPSULE    Take 1 capsule (100 mg total) by mouth 3 (three) times daily. Start with 1 capsule at bedtime and advance to 3 capsules as tolerated.   HYDROMORPHONE (DILAUDID) 2 MG TABLET    Take 1-2 tablets (2-4 mg total) by mouth every 4 (four) hours as needed for severe pain.   HYDROXYZINE (ATARAX) 50 MG TABLET    Take 50 mg by mouth at bedtime.   INSULIN DETEMIR (LEVEMIR) 100 UNIT/ML INJECTION    Inject 0.3-0.6 mLs (30-60 Units total) into the skin daily.   LOSARTAN (COZAAR) 100 MG TABLET    Take 1 tablet (100  mg total) by mouth daily.   METOPROLOL SUCCINATE (TOPROL-XL) 100 MG 24 HR TABLET    Take 100 mg by mouth at bedtime.   MULTIPLE VITAMINS-MINERALS (PRESERVISION AREDS 2+MULTI VIT PO)    Take 1 capsule by mouth daily.   NICOTINE (NICODERM CQ - DOSED IN MG/24 HOURS) 21 MG/24HR PATCH    Place 1 patch (21 mg total) onto the skin daily.   NITROFURANTOIN, MACROCRYSTAL-MONOHYDRATE, (MACROBID) 100 MG CAPSULE    Take 100 mg by mouth at bedtime.   ONDANSETRON (ZOFRAN) 4 MG TABLET    Take 1 tablet (4 mg total) by mouth every 6 (six) hours as needed for nausea or vomiting.   ONETOUCH ULTRA TEST STRIP    4 (four) times daily.   OXYCODONE (OXY IR/ROXICODONE) 5 MG IMMEDIATE RELEASE TABLET    Take 1-2 tablets (5-10 mg total) by mouth every 4 (four) hours as needed for moderate pain ((score 4 to 6)).   PANTOPRAZOLE (PROTONIX) 40 MG TABLET    Take 40 mg by mouth 2 (two) times daily.   PERCOCET 5-325 MG TABLET    take 1-2 tablet by oral  route  every 6 hours as needed   PHENAZOPYRIDINE (PYRIDIUM) 200 MG TABLET    Take 200 mg by mouth 3 (three) times daily as needed.   SENNOSIDES-DOCUSATE SODIUM (SENOKOT-S) 8.6-50 MG TABLET    Take 1 tablet by mouth daily as needed for constipation.   SUCRALFATE (CARAFATE) 1 G TABLET    Take 1 g by mouth 2 (two) times daily as needed (acid reflux).   VIBEGRON (GEMTESA) 75 MG TABS    Take 75 mg by mouth at bedtime.   ZOLPIDEM (AMBIEN) 10 MG TABLET    Take 10 mg by mouth at bedtime as needed for sleep.  Modified Medications   No medications on file  Discontinued Medications   No medications on file      Past Medical History:  Diagnosis Date   Anemia    Arthritis    Bladder incontinence    Broken foot    Cataracts, bilateral    Chronic kidney insufficiency    stage 3b   COPD (chronic obstructive pulmonary disease) (HCC)    wheezing   Coronary artery disease 04/25/2022   in [place] 2021   very mild case   CVA (cerebral vascular accident) ([place]) 2016   has had 3 strokes, states right side is slightlyweaker than left   Diabetes mellitus    insulin dependent, Type 2   GERD (gastroesophageal reflux disease)    HLD (hyperlipidemia)    Hx of cardiovascular stress test    a. ETT (6/13):  Ex 5:13; no ischemic changes   Hypertension    controlled on meds   Lacunar stroke of left subthalamic region [facility]) 02/2015   Leg pain    left   Lower back pain    Neuromuscular disorder (HCC)    stroke right hand tingling   Orthostatic hypotension    Osteopenia 01/2017   T score -2.0 stable from prior DEXA   Pancreatitis 10/2021   PCOS (polycystic ovarian syndrome)    Personal history of tobacco use, presenting hazards to health 01/09/2015   PONV (postoperative nausea and vomiting)    Sleep apnea    uses CPAP nightly    Social History   Tobacco Use   Smoking status: Every Day    Packs/day: 1.50    Years: 50.00    Additional pack years: 0.00  Total pack years: 75.00    Types:  Cigarettes   Smokeless tobacco: Never   Tobacco comments:    3ppd x 10 year, then cut back to 1.5pdd since 02/2015  Vaping Use   Vaping Use: Never used  Substance Use Topics   Alcohol use: Never    Alcohol/week: 0.0 standard drinks of alcohol   Drug use: No    Family History  Problem Relation Age of Onset   Hypertension Mother    Heart disease Mother 57       MI   Diabetes Father    Diabetes Sister    Hypertension Sister    Diabetes Brother    Hypertension Brother    Heart disease Brother 1       CAD   Cancer Sister        Multiple myloma   Diabetes Brother     Allergies  Allergen Reactions   Iodine Anaphylaxis and Other (See Comments)    Pt states that she is allergic to ingested iodine only, okay for betadine.     Shellfish Allergy Anaphylaxis   Codeine Nausea And Vomiting   Morphine Sulfate Nausea And Vomiting   Irbesartan Other (See Comments)     Unknown  (Avapro)   Sulfa Antibiotics Other (See Comments)    Fever     Review of Systems  Constitutional: Negative.   Gastrointestinal: Negative.   Musculoskeletal: Negative.      OBJECTIVE:    Vitals:   09/04/22 0911  BP: (!) 204/88  Pulse: 86  Temp: 97.6 F (36.4 C)  TempSrc: Temporal  Weight: 156 lb (70.8 kg)  Height: 5\' 7"  (1.702 m)   Body mass index is 24.43 kg/m.  Physical Exam Constitutional:      Appearance: Normal appearance.  HENT:     Head: Normocephalic and atraumatic.  Skin:    General: Skin is warm and dry.  Neurological:     General: No focal deficit present.     Mental Status: She is alert and oriented to person, place, and time.  Psychiatric:        Mood and Affect: Mood normal.        Behavior: Behavior normal.      Labs and Microbiology:    Latest Ref Rng & Units 08/08/2022    1:15 PM 07/08/2022    2:20 PM 11/12/2021    5:16 AM  CBC  WBC 4.0 - 10.5 K/uL 11.1  11.4  10.2   Hemoglobin 12.0 - 15.0 g/dL 9.1  11.0  8.7   Hematocrit 36.0 - 46.0 % 27.0  34.4  26.4    Platelets 150 - 400 K/uL 392  369  242       Latest Ref Rng & Units 08/13/2022   12:34 AM 08/08/2022    1:15 PM 07/09/2022   10:37 AM  CMP  Glucose 70 - 99 mg/dL 199  148  99   BUN 8 - 23 mg/dL 24  31  30    Creatinine 0.44 - 1.00 mg/dL 1.84  1.98  2.10   Sodium 135 - 145 mmol/L 129  127  131   Potassium 3.5 - 5.1 mmol/L 4.4  3.9  4.6   Chloride 98 - 111 mmol/L 99  96  97   CO2 22 - 32 mmol/L 22  21  25    Calcium 8.9 - 10.3 mg/dL 9.1  8.9  8.6   Total Protein 6.5 - 8.1 g/dL  6.2    Total Bilirubin  0.3 - 1.2 mg/dL  0.5    Alkaline Phos 38 - 126 U/L  91    AST 15 - 41 U/L  19    ALT 0 - 44 U/L  11        ASSESSMENT & PLAN:    Wound infection complicating hardware (HCC) Doing well today following about 4.5 weeks of antibiotics for lumbar wound infection.  Fortunately did not appear to go below the fascia and was felt to be more superficial; not involving the hardware.  Will plan to observe off antibiotics at this time and follow up in about 6 weeks for clinical re-evaluation.      Raynelle High[facility] for Infectious Disease [facility] 09/04/2022, 9:53 AM  I have personally spent 30 minutes involved in face-to-face and non-face-to-face activities for this patient on the day of the visit. Professional time spent includes the following activities: Preparing to see the patient (review of tests), Obtaining and/or reviewing separately obtained history (admission/discharge record), Performing a medically appropriate examination and/or evaluation , Ordering medications/tests/procedures, referring and communicating with other health care professionals, Documenting clinical information in the EMR, Independently interpreting results (not separately reported), Communicating results to the patient/family/caregiver, Counseling and educating the patient/family/caregiver and Care coordination (not separately reported).

## 2022-09-04 NOTE — Patient Instructions (Signed)
Oral antibiotics that cover MRSA: - Doxycycline  - Bactrim - Linezolid

## 2022-09-04 NOTE — Assessment & Plan Note (Signed)
Doing well today following about 4.5 weeks of antibiotics for lumbar wound infection.  Fortunately did not appear to go below the fascia and was felt to be more superficial; not involving the hardware.  Will plan to observe off antibiotics at this time and follow up in about 6 weeks for clinical re-evaluation.

## 2022-09-05 DIAGNOSIS — R300 Dysuria: Secondary | ICD-10-CM | POA: Diagnosis not present

## 2022-09-05 DIAGNOSIS — N3020 Other chronic cystitis without hematuria: Secondary | ICD-10-CM | POA: Diagnosis not present

## 2022-09-05 DIAGNOSIS — R8271 Bacteriuria: Secondary | ICD-10-CM | POA: Diagnosis not present

## 2022-09-06 DIAGNOSIS — L089 Local infection of the skin and subcutaneous tissue, unspecified: Secondary | ICD-10-CM | POA: Diagnosis not present

## 2022-09-10 DIAGNOSIS — E871 Hypo-osmolality and hyponatremia: Secondary | ICD-10-CM | POA: Diagnosis not present

## 2022-09-10 DIAGNOSIS — D631 Anemia in chronic kidney disease: Secondary | ICD-10-CM | POA: Diagnosis not present

## 2022-09-10 DIAGNOSIS — I10 Essential (primary) hypertension: Secondary | ICD-10-CM | POA: Diagnosis not present

## 2022-09-10 DIAGNOSIS — N2581 Secondary hyperparathyroidism of renal origin: Secondary | ICD-10-CM | POA: Diagnosis not present

## 2022-09-10 DIAGNOSIS — N184 Chronic kidney disease, stage 4 (severe): Secondary | ICD-10-CM | POA: Diagnosis not present

## 2022-09-10 DIAGNOSIS — E1122 Type 2 diabetes mellitus with diabetic chronic kidney disease: Secondary | ICD-10-CM | POA: Diagnosis not present

## 2022-09-19 DIAGNOSIS — G4733 Obstructive sleep apnea (adult) (pediatric): Secondary | ICD-10-CM | POA: Diagnosis not present

## 2022-09-19 DIAGNOSIS — F17210 Nicotine dependence, cigarettes, uncomplicated: Secondary | ICD-10-CM | POA: Diagnosis not present

## 2022-09-19 DIAGNOSIS — R918 Other nonspecific abnormal finding of lung field: Secondary | ICD-10-CM | POA: Diagnosis not present

## 2022-09-19 DIAGNOSIS — J449 Chronic obstructive pulmonary disease, unspecified: Secondary | ICD-10-CM | POA: Diagnosis not present

## 2022-09-20 DIAGNOSIS — N3020 Other chronic cystitis without hematuria: Secondary | ICD-10-CM | POA: Diagnosis not present

## 2022-09-24 DIAGNOSIS — L720 Epidermal cyst: Secondary | ICD-10-CM | POA: Diagnosis not present

## 2022-09-24 DIAGNOSIS — B078 Other viral warts: Secondary | ICD-10-CM | POA: Diagnosis not present

## 2022-09-24 DIAGNOSIS — L538 Other specified erythematous conditions: Secondary | ICD-10-CM | POA: Diagnosis not present

## 2022-09-24 DIAGNOSIS — L298 Other pruritus: Secondary | ICD-10-CM | POA: Diagnosis not present

## 2022-09-25 DIAGNOSIS — I2510 Atherosclerotic heart disease of native coronary artery without angina pectoris: Secondary | ICD-10-CM | POA: Diagnosis not present

## 2022-09-25 DIAGNOSIS — M1990 Unspecified osteoarthritis, unspecified site: Secondary | ICD-10-CM | POA: Diagnosis not present

## 2022-09-25 DIAGNOSIS — Z794 Long term (current) use of insulin: Secondary | ICD-10-CM | POA: Diagnosis not present

## 2022-09-25 DIAGNOSIS — E1122 Type 2 diabetes mellitus with diabetic chronic kidney disease: Secondary | ICD-10-CM | POA: Diagnosis not present

## 2022-09-25 DIAGNOSIS — N184 Chronic kidney disease, stage 4 (severe): Secondary | ICD-10-CM | POA: Diagnosis not present

## 2022-09-25 DIAGNOSIS — J449 Chronic obstructive pulmonary disease, unspecified: Secondary | ICD-10-CM | POA: Diagnosis not present

## 2022-09-25 DIAGNOSIS — Z981 Arthrodesis status: Secondary | ICD-10-CM | POA: Diagnosis not present

## 2022-09-25 DIAGNOSIS — N390 Urinary tract infection, site not specified: Secondary | ICD-10-CM | POA: Diagnosis not present

## 2022-09-25 DIAGNOSIS — I129 Hypertensive chronic kidney disease with stage 1 through stage 4 chronic kidney disease, or unspecified chronic kidney disease: Secondary | ICD-10-CM | POA: Diagnosis not present

## 2022-09-25 DIAGNOSIS — Z4789 Encounter for other orthopedic aftercare: Secondary | ICD-10-CM | POA: Diagnosis not present

## 2022-09-30 DIAGNOSIS — H353211 Exudative age-related macular degeneration, right eye, with active choroidal neovascularization: Secondary | ICD-10-CM | POA: Diagnosis not present

## 2022-10-04 DIAGNOSIS — L0231 Cutaneous abscess of buttock: Secondary | ICD-10-CM | POA: Diagnosis not present

## 2022-10-07 DIAGNOSIS — N3020 Other chronic cystitis without hematuria: Secondary | ICD-10-CM | POA: Diagnosis not present

## 2022-10-08 DIAGNOSIS — M48062 Spinal stenosis, lumbar region with neurogenic claudication: Secondary | ICD-10-CM | POA: Diagnosis not present

## 2022-10-09 ENCOUNTER — Ambulatory Visit: Payer: HMO | Admitting: Podiatry

## 2022-10-09 ENCOUNTER — Ambulatory Visit: Payer: HMO | Admitting: Podiatrist

## 2022-10-10 DIAGNOSIS — N1832 Chronic kidney disease, stage 3b: Secondary | ICD-10-CM | POA: Diagnosis not present

## 2022-10-10 DIAGNOSIS — Z1331 Encounter for screening for depression: Secondary | ICD-10-CM | POA: Diagnosis not present

## 2022-10-10 DIAGNOSIS — F418 Other specified anxiety disorders: Secondary | ICD-10-CM | POA: Diagnosis not present

## 2022-10-10 DIAGNOSIS — N2581 Secondary hyperparathyroidism of renal origin: Secondary | ICD-10-CM | POA: Diagnosis not present

## 2022-10-10 DIAGNOSIS — I10 Essential (primary) hypertension: Secondary | ICD-10-CM | POA: Diagnosis not present

## 2022-10-10 DIAGNOSIS — Z0000 Encounter for general adult medical examination without abnormal findings: Secondary | ICD-10-CM | POA: Diagnosis not present

## 2022-10-10 DIAGNOSIS — I2510 Atherosclerotic heart disease of native coronary artery without angina pectoris: Secondary | ICD-10-CM | POA: Diagnosis not present

## 2022-10-10 DIAGNOSIS — J449 Chronic obstructive pulmonary disease, unspecified: Secondary | ICD-10-CM | POA: Diagnosis not present

## 2022-10-10 DIAGNOSIS — E1151 Type 2 diabetes mellitus with diabetic peripheral angiopathy without gangrene: Secondary | ICD-10-CM | POA: Diagnosis not present

## 2022-10-10 DIAGNOSIS — F5101 Primary insomnia: Secondary | ICD-10-CM | POA: Diagnosis not present

## 2022-10-14 ENCOUNTER — Telehealth: Payer: Self-pay

## 2022-10-14 DIAGNOSIS — L538 Other specified erythematous conditions: Secondary | ICD-10-CM | POA: Diagnosis not present

## 2022-10-14 DIAGNOSIS — B078 Other viral warts: Secondary | ICD-10-CM | POA: Diagnosis not present

## 2022-10-14 DIAGNOSIS — L298 Other pruritus: Secondary | ICD-10-CM | POA: Diagnosis not present

## 2022-10-14 DIAGNOSIS — H353221 Exudative age-related macular degeneration, left eye, with active choroidal neovascularization: Secondary | ICD-10-CM | POA: Diagnosis not present

## 2022-10-14 DIAGNOSIS — L281 Prurigo nodularis: Secondary | ICD-10-CM | POA: Diagnosis not present

## 2022-10-14 DIAGNOSIS — L0291 Cutaneous abscess, unspecified: Secondary | ICD-10-CM | POA: Diagnosis not present

## 2022-10-14 NOTE — Telephone Encounter (Signed)
Pt called and left vm that she would like to cancel appt with Dr. Earlene Plater. She also req a returned call to confirm cancellation. Left VM to confirm that pt's req has been comp.

## 2022-10-17 ENCOUNTER — Ambulatory Visit: Payer: PPO | Admitting: Internal Medicine

## 2022-10-17 DIAGNOSIS — E1122 Type 2 diabetes mellitus with diabetic chronic kidney disease: Secondary | ICD-10-CM | POA: Diagnosis not present

## 2022-10-17 DIAGNOSIS — Z4789 Encounter for other orthopedic aftercare: Secondary | ICD-10-CM | POA: Diagnosis not present

## 2022-10-17 DIAGNOSIS — I129 Hypertensive chronic kidney disease with stage 1 through stage 4 chronic kidney disease, or unspecified chronic kidney disease: Secondary | ICD-10-CM | POA: Diagnosis not present

## 2022-10-17 DIAGNOSIS — J449 Chronic obstructive pulmonary disease, unspecified: Secondary | ICD-10-CM | POA: Diagnosis not present

## 2022-10-17 DIAGNOSIS — N390 Urinary tract infection, site not specified: Secondary | ICD-10-CM | POA: Diagnosis not present

## 2022-10-17 DIAGNOSIS — N184 Chronic kidney disease, stage 4 (severe): Secondary | ICD-10-CM | POA: Diagnosis not present

## 2022-10-17 DIAGNOSIS — Z794 Long term (current) use of insulin: Secondary | ICD-10-CM | POA: Diagnosis not present

## 2022-10-17 DIAGNOSIS — M1990 Unspecified osteoarthritis, unspecified site: Secondary | ICD-10-CM | POA: Diagnosis not present

## 2022-10-17 DIAGNOSIS — I2510 Atherosclerotic heart disease of native coronary artery without angina pectoris: Secondary | ICD-10-CM | POA: Diagnosis not present

## 2022-10-17 DIAGNOSIS — Z981 Arthrodesis status: Secondary | ICD-10-CM | POA: Diagnosis not present

## 2022-10-18 ENCOUNTER — Encounter: Payer: Self-pay | Admitting: Internal Medicine

## 2022-10-18 DIAGNOSIS — R300 Dysuria: Secondary | ICD-10-CM | POA: Diagnosis not present

## 2022-10-18 DIAGNOSIS — N3020 Other chronic cystitis without hematuria: Secondary | ICD-10-CM | POA: Diagnosis not present

## 2022-10-28 ENCOUNTER — Ambulatory Visit: Payer: HMO | Admitting: Podiatry

## 2022-10-28 DIAGNOSIS — H903 Sensorineural hearing loss, bilateral: Secondary | ICD-10-CM | POA: Diagnosis not present

## 2022-11-07 DIAGNOSIS — N3020 Other chronic cystitis without hematuria: Secondary | ICD-10-CM | POA: Diagnosis not present

## 2022-11-07 DIAGNOSIS — G4733 Obstructive sleep apnea (adult) (pediatric): Secondary | ICD-10-CM | POA: Diagnosis not present

## 2022-11-12 DIAGNOSIS — N1832 Chronic kidney disease, stage 3b: Secondary | ICD-10-CM | POA: Diagnosis not present

## 2022-11-12 DIAGNOSIS — R600 Localized edema: Secondary | ICD-10-CM | POA: Diagnosis not present

## 2022-11-12 DIAGNOSIS — Z720 Tobacco use: Secondary | ICD-10-CM | POA: Diagnosis not present

## 2022-11-12 DIAGNOSIS — I2510 Atherosclerotic heart disease of native coronary artery without angina pectoris: Secondary | ICD-10-CM | POA: Diagnosis not present

## 2022-11-12 DIAGNOSIS — J449 Chronic obstructive pulmonary disease, unspecified: Secondary | ICD-10-CM | POA: Diagnosis not present

## 2022-11-12 DIAGNOSIS — F418 Other specified anxiety disorders: Secondary | ICD-10-CM | POA: Diagnosis not present

## 2022-11-12 DIAGNOSIS — E785 Hyperlipidemia, unspecified: Secondary | ICD-10-CM | POA: Diagnosis not present

## 2022-11-12 DIAGNOSIS — I10 Essential (primary) hypertension: Secondary | ICD-10-CM | POA: Diagnosis not present

## 2022-11-12 DIAGNOSIS — G4733 Obstructive sleep apnea (adult) (pediatric): Secondary | ICD-10-CM | POA: Diagnosis not present

## 2022-11-18 DIAGNOSIS — M545 Low back pain, unspecified: Secondary | ICD-10-CM | POA: Diagnosis not present

## 2022-11-19 DIAGNOSIS — M545 Low back pain, unspecified: Secondary | ICD-10-CM | POA: Diagnosis not present

## 2022-11-21 DIAGNOSIS — I10 Essential (primary) hypertension: Secondary | ICD-10-CM | POA: Diagnosis not present

## 2022-11-21 DIAGNOSIS — F418 Other specified anxiety disorders: Secondary | ICD-10-CM | POA: Diagnosis not present

## 2022-11-25 DIAGNOSIS — M545 Low back pain, unspecified: Secondary | ICD-10-CM | POA: Diagnosis not present

## 2022-11-28 DIAGNOSIS — M545 Low back pain, unspecified: Secondary | ICD-10-CM | POA: Diagnosis not present

## 2022-12-02 DIAGNOSIS — M545 Low back pain, unspecified: Secondary | ICD-10-CM | POA: Diagnosis not present

## 2022-12-03 DIAGNOSIS — N3020 Other chronic cystitis without hematuria: Secondary | ICD-10-CM | POA: Diagnosis not present

## 2022-12-05 DIAGNOSIS — M545 Low back pain, unspecified: Secondary | ICD-10-CM | POA: Diagnosis not present

## 2022-12-09 DIAGNOSIS — M545 Low back pain, unspecified: Secondary | ICD-10-CM | POA: Diagnosis not present

## 2022-12-10 DIAGNOSIS — E1122 Type 2 diabetes mellitus with diabetic chronic kidney disease: Secondary | ICD-10-CM | POA: Diagnosis not present

## 2022-12-10 DIAGNOSIS — N184 Chronic kidney disease, stage 4 (severe): Secondary | ICD-10-CM | POA: Diagnosis not present

## 2022-12-11 DIAGNOSIS — M545 Low back pain, unspecified: Secondary | ICD-10-CM | POA: Diagnosis not present

## 2022-12-16 ENCOUNTER — Ambulatory Visit (INDEPENDENT_AMBULATORY_CARE_PROVIDER_SITE_OTHER): Payer: PPO | Admitting: Podiatry

## 2022-12-16 DIAGNOSIS — M79676 Pain in unspecified toe(s): Secondary | ICD-10-CM

## 2022-12-16 DIAGNOSIS — E1142 Type 2 diabetes mellitus with diabetic polyneuropathy: Secondary | ICD-10-CM | POA: Diagnosis not present

## 2022-12-16 DIAGNOSIS — B351 Tinea unguium: Secondary | ICD-10-CM | POA: Diagnosis not present

## 2022-12-16 NOTE — Progress Notes (Signed)
She presents today chief complaint of painful elongated toenails states that her neuropathy seems to be getting worse.  Her last hemoglobin A1c was at 6.7.  Objective: Vital signs stable oriented x 3 pulses are palpable.  Neurologic sensorium is unchanged.  Toenails are long thick yellow dystrophic onychomycotic no open lesions or wounds to the plantar aspect interdigitally or dorsal aspect of the bilateral foot.  Assessment: Diabetes with diabetic peripheral neuropathy painful elongated toenails.  Plan: Debridement of toenails 1 through 5 bilateral.  Follow-up with her as needed

## 2022-12-17 DIAGNOSIS — N2581 Secondary hyperparathyroidism of renal origin: Secondary | ICD-10-CM | POA: Diagnosis not present

## 2022-12-17 DIAGNOSIS — D631 Anemia in chronic kidney disease: Secondary | ICD-10-CM | POA: Diagnosis not present

## 2022-12-17 DIAGNOSIS — R809 Proteinuria, unspecified: Secondary | ICD-10-CM | POA: Diagnosis not present

## 2022-12-17 DIAGNOSIS — E1122 Type 2 diabetes mellitus with diabetic chronic kidney disease: Secondary | ICD-10-CM | POA: Diagnosis not present

## 2022-12-17 DIAGNOSIS — N184 Chronic kidney disease, stage 4 (severe): Secondary | ICD-10-CM | POA: Diagnosis not present

## 2022-12-17 DIAGNOSIS — E871 Hypo-osmolality and hyponatremia: Secondary | ICD-10-CM | POA: Diagnosis not present

## 2022-12-18 DIAGNOSIS — M545 Low back pain, unspecified: Secondary | ICD-10-CM | POA: Diagnosis not present

## 2022-12-24 DIAGNOSIS — M545 Low back pain, unspecified: Secondary | ICD-10-CM | POA: Diagnosis not present

## 2022-12-26 DIAGNOSIS — M545 Low back pain, unspecified: Secondary | ICD-10-CM | POA: Diagnosis not present

## 2022-12-31 DIAGNOSIS — H353221 Exudative age-related macular degeneration, left eye, with active choroidal neovascularization: Secondary | ICD-10-CM | POA: Diagnosis not present

## 2022-12-31 DIAGNOSIS — H353211 Exudative age-related macular degeneration, right eye, with active choroidal neovascularization: Secondary | ICD-10-CM | POA: Diagnosis not present

## 2023-01-01 DIAGNOSIS — E119 Type 2 diabetes mellitus without complications: Secondary | ICD-10-CM | POA: Diagnosis not present

## 2023-01-01 DIAGNOSIS — I2510 Atherosclerotic heart disease of native coronary artery without angina pectoris: Secondary | ICD-10-CM | POA: Diagnosis not present

## 2023-01-01 DIAGNOSIS — L989 Disorder of the skin and subcutaneous tissue, unspecified: Secondary | ICD-10-CM | POA: Diagnosis not present

## 2023-01-01 DIAGNOSIS — F418 Other specified anxiety disorders: Secondary | ICD-10-CM | POA: Diagnosis not present

## 2023-01-01 DIAGNOSIS — I10 Essential (primary) hypertension: Secondary | ICD-10-CM | POA: Diagnosis not present

## 2023-01-01 DIAGNOSIS — Z794 Long term (current) use of insulin: Secondary | ICD-10-CM | POA: Diagnosis not present

## 2023-01-01 DIAGNOSIS — L299 Pruritus, unspecified: Secondary | ICD-10-CM | POA: Diagnosis not present

## 2023-01-02 DIAGNOSIS — M545 Low back pain, unspecified: Secondary | ICD-10-CM | POA: Diagnosis not present

## 2023-01-02 DIAGNOSIS — N3020 Other chronic cystitis without hematuria: Secondary | ICD-10-CM | POA: Diagnosis not present

## 2023-01-07 DIAGNOSIS — M545 Low back pain, unspecified: Secondary | ICD-10-CM | POA: Diagnosis not present

## 2023-01-09 DIAGNOSIS — M545 Low back pain, unspecified: Secondary | ICD-10-CM | POA: Diagnosis not present

## 2023-01-13 DIAGNOSIS — M545 Low back pain, unspecified: Secondary | ICD-10-CM | POA: Diagnosis not present

## 2023-01-14 DIAGNOSIS — D692 Other nonthrombocytopenic purpura: Secondary | ICD-10-CM | POA: Diagnosis not present

## 2023-01-14 DIAGNOSIS — I10 Essential (primary) hypertension: Secondary | ICD-10-CM | POA: Diagnosis not present

## 2023-01-14 DIAGNOSIS — I69351 Hemiplegia and hemiparesis following cerebral infarction affecting right dominant side: Secondary | ICD-10-CM | POA: Diagnosis not present

## 2023-01-14 DIAGNOSIS — Z794 Long term (current) use of insulin: Secondary | ICD-10-CM | POA: Diagnosis not present

## 2023-01-14 DIAGNOSIS — E118 Type 2 diabetes mellitus with unspecified complications: Secondary | ICD-10-CM | POA: Diagnosis not present

## 2023-01-16 ENCOUNTER — Ambulatory Visit
Admission: RE | Admit: 2023-01-16 | Discharge: 2023-01-16 | Disposition: A | Discharge status: Home / Self Care | Payer: PPO | Source: Ambulatory Visit | Attending: Acute Care | Admitting: Acute Care

## 2023-01-16 DIAGNOSIS — Z87891 Personal history of nicotine dependence: Secondary | ICD-10-CM | POA: Insufficient documentation

## 2023-01-16 DIAGNOSIS — F17210 Nicotine dependence, cigarettes, uncomplicated: Secondary | ICD-10-CM | POA: Insufficient documentation

## 2023-01-16 DIAGNOSIS — Z122 Encounter for screening for malignant neoplasm of respiratory organs: Secondary | ICD-10-CM | POA: Insufficient documentation

## 2023-01-16 DIAGNOSIS — M545 Low back pain, unspecified: Secondary | ICD-10-CM | POA: Diagnosis not present

## 2023-01-20 DIAGNOSIS — M545 Low back pain, unspecified: Secondary | ICD-10-CM | POA: Diagnosis not present

## 2023-01-22 DIAGNOSIS — G4733 Obstructive sleep apnea (adult) (pediatric): Secondary | ICD-10-CM | POA: Diagnosis not present

## 2023-01-23 ENCOUNTER — Telehealth: Payer: Self-pay | Admitting: Acute Care

## 2023-01-23 ENCOUNTER — Other Ambulatory Visit: Payer: Self-pay | Admitting: Acute Care

## 2023-01-23 DIAGNOSIS — R911 Solitary pulmonary nodule: Secondary | ICD-10-CM

## 2023-01-23 DIAGNOSIS — M545 Low back pain, unspecified: Secondary | ICD-10-CM | POA: Diagnosis not present

## 2023-01-23 NOTE — Telephone Encounter (Addendum)
I have called the patient with results of her low-dose screening CT.  I explained that she has a new nodule in the left upper lobe that is mixed density total size 14.9 mm with a 9.4 mm solid component. Her scan was read as a lung RADS 4B. I explained to the patient that the next best step is for a PET scan to better evaluate this area of concern. She is seen by Dr. Ned Clines at [facility] for pulmonary. She will need follow-up with Dr. Meredeth Ide in the office to review the results of the scan within a week of it being completed.  I have secure chatted Dr. Meredeth Ide to see if there is a nurse that I can add to this communication to make sure that the follow-up appointment gets scheduled. To niece, Angelique Blonder and Robynn Pane, please track appointment date for PET scan, so that we can ensure patient is scheduled to see Dr. Meredeth Ide within a week after scan results to review. Patient has verbalized understanding of this plan is in agreement. Please fax results to PCP and make sure they understand plan is for follow-up PET scan then office visit with Dr. Meredeth Ide to review results.  Dr. Darrold Junker, please see the notations on scan of aortic atherosclerosis, as well as atherosclerosis of the great vessels of the mediastinum and the coronary arteries, including calcified atherosclerotic plaque in the left main, left anterior descending, left circumflex and right coronary arteries. I wanted to make sure you were aware.  Thanks so much everyone

## 2023-01-23 NOTE — Telephone Encounter (Signed)
Confirmed call report and results noted in [facility] dashboard.  Routed results to provider for review.  IMPRESSION: 1. New left upper lobe lesion concerning for neoplasm categorized as Lung-RADS 4BS, suspicious. Additional imaging evaluation or consultation with Pulmonology or Thoracic Surgery recommended. 2. The "S" modifier above refers to potentially clinically significant non lung cancer related findings. Specifically, there is aortic atherosclerosis, in addition to left main and three-vessel coronary artery disease. Please note that although the presence of coronary artery calcium documents the presence of coronary artery disease, the severity of this disease and any potential stenosis cannot be assessed on this non-gated CT examination. Assessment for potential risk factor modification, dietary therapy or pharmacologic therapy may be warranted, if clinically indicated. 3. Mild diffuse bronchial wall thickening with mild centrilobular and paraseptal emphysema; imaging findings suggestive of underlying COPD.   These results will be called to the ordering clinician or representative by the Radiologist Assistant, and communication documented in the PACS or [employer].   Aortic Atherosclerosis (ICD10-I70.0) and Emphysema (ICD10-J43.9).

## 2023-01-24 NOTE — Telephone Encounter (Signed)
Results/plan faxed to PCP 

## 2023-01-27 DIAGNOSIS — N3020 Other chronic cystitis without hematuria: Secondary | ICD-10-CM | POA: Diagnosis not present

## 2023-01-28 DIAGNOSIS — M545 Low back pain, unspecified: Secondary | ICD-10-CM | POA: Diagnosis not present

## 2023-01-29 NOTE — Telephone Encounter (Signed)
Spoke with Dr. Reita Cliche office.  No appt has been scheduled for follow up OV to review results of PET.  They will contact patient today to get her scheduled.  PET is planned for 02/03/23

## 2023-01-30 DIAGNOSIS — J449 Chronic obstructive pulmonary disease, unspecified: Secondary | ICD-10-CM | POA: Diagnosis not present

## 2023-01-30 DIAGNOSIS — N1832 Chronic kidney disease, stage 3b: Secondary | ICD-10-CM | POA: Diagnosis not present

## 2023-01-30 DIAGNOSIS — I6381 Other cerebral infarction due to occlusion or stenosis of small artery: Secondary | ICD-10-CM | POA: Diagnosis not present

## 2023-01-30 DIAGNOSIS — Z720 Tobacco use: Secondary | ICD-10-CM | POA: Diagnosis not present

## 2023-01-30 DIAGNOSIS — I2510 Atherosclerotic heart disease of native coronary artery without angina pectoris: Secondary | ICD-10-CM | POA: Diagnosis not present

## 2023-01-30 DIAGNOSIS — Z794 Long term (current) use of insulin: Secondary | ICD-10-CM | POA: Diagnosis not present

## 2023-01-30 DIAGNOSIS — M545 Low back pain, unspecified: Secondary | ICD-10-CM | POA: Diagnosis not present

## 2023-01-30 DIAGNOSIS — E119 Type 2 diabetes mellitus without complications: Secondary | ICD-10-CM | POA: Diagnosis not present

## 2023-01-30 DIAGNOSIS — G4733 Obstructive sleep apnea (adult) (pediatric): Secondary | ICD-10-CM | POA: Diagnosis not present

## 2023-01-30 DIAGNOSIS — I639 Cerebral infarction, unspecified: Secondary | ICD-10-CM | POA: Diagnosis not present

## 2023-01-30 DIAGNOSIS — E785 Hyperlipidemia, unspecified: Secondary | ICD-10-CM | POA: Diagnosis not present

## 2023-01-30 DIAGNOSIS — I10 Essential (primary) hypertension: Secondary | ICD-10-CM | POA: Diagnosis not present

## 2023-01-30 DIAGNOSIS — R002 Palpitations: Secondary | ICD-10-CM | POA: Diagnosis not present

## 2023-02-03 ENCOUNTER — Ambulatory Visit
Admission: RE | Admit: 2023-02-03 | Discharge: 2023-02-03 | Disposition: A | Discharge status: Home / Self Care | Payer: PPO | Source: Ambulatory Visit | Attending: Acute Care | Admitting: Acute Care

## 2023-02-03 DIAGNOSIS — Z8673 Personal history of transient ischemic attack (TIA), and cerebral infarction without residual deficits: Secondary | ICD-10-CM | POA: Diagnosis not present

## 2023-02-03 DIAGNOSIS — F5104 Psychophysiologic insomnia: Secondary | ICD-10-CM | POA: Diagnosis not present

## 2023-02-03 DIAGNOSIS — R911 Solitary pulmonary nodule: Secondary | ICD-10-CM | POA: Diagnosis not present

## 2023-02-03 DIAGNOSIS — M5416 Radiculopathy, lumbar region: Secondary | ICD-10-CM | POA: Diagnosis not present

## 2023-02-03 DIAGNOSIS — R918 Other nonspecific abnormal finding of lung field: Secondary | ICD-10-CM | POA: Diagnosis not present

## 2023-02-03 DIAGNOSIS — R5383 Other fatigue: Secondary | ICD-10-CM | POA: Diagnosis not present

## 2023-02-03 DIAGNOSIS — R531 Weakness: Secondary | ICD-10-CM | POA: Diagnosis not present

## 2023-02-03 LAB — GLUCOSE, CAPILLARY: Glucose-Capillary: 108 mg/dL — ABNORMAL HIGH (ref 70–99)

## 2023-02-03 MED ORDER — FLUDEOXYGLUCOSE F - 18 (FDG) INJECTION
7.8000 | Freq: Once | INTRAVENOUS | Status: AC | PRN
  Administered 2023-02-03: 8.12 via INTRAVENOUS

## 2023-02-04 ENCOUNTER — Encounter: Payer: Self-pay | Admitting: Internal Medicine

## 2023-02-04 ENCOUNTER — Other Ambulatory Visit: Payer: Self-pay | Admitting: Student

## 2023-02-04 ENCOUNTER — Ambulatory Visit
Admission: RE | Admit: 2023-02-04 | Discharge: 2023-02-04 | Disposition: A | Discharge status: Home / Self Care | Payer: PPO | Source: Ambulatory Visit | Attending: Student | Admitting: Student

## 2023-02-04 DIAGNOSIS — I6782 Cerebral ischemia: Secondary | ICD-10-CM | POA: Diagnosis not present

## 2023-02-04 DIAGNOSIS — R531 Weakness: Secondary | ICD-10-CM

## 2023-02-04 DIAGNOSIS — G319 Degenerative disease of nervous system, unspecified: Secondary | ICD-10-CM | POA: Diagnosis not present

## 2023-02-04 DIAGNOSIS — I6389 Other cerebral infarction: Secondary | ICD-10-CM | POA: Diagnosis not present

## 2023-02-04 DIAGNOSIS — M545 Low back pain, unspecified: Secondary | ICD-10-CM | POA: Diagnosis not present

## 2023-02-06 ENCOUNTER — Other Ambulatory Visit: Payer: Self-pay | Admitting: Student

## 2023-02-06 DIAGNOSIS — M545 Low back pain, unspecified: Secondary | ICD-10-CM | POA: Diagnosis not present

## 2023-02-06 DIAGNOSIS — D490 Neoplasm of unspecified behavior of digestive system: Secondary | ICD-10-CM

## 2023-02-07 ENCOUNTER — Ambulatory Visit: Payer: PPO

## 2023-02-07 DIAGNOSIS — D37030 Neoplasm of uncertain behavior of the parotid salivary glands: Secondary | ICD-10-CM | POA: Diagnosis not present

## 2023-02-10 ENCOUNTER — Other Ambulatory Visit: Payer: Self-pay | Admitting: Student

## 2023-02-10 DIAGNOSIS — R600 Localized edema: Secondary | ICD-10-CM | POA: Diagnosis not present

## 2023-02-10 DIAGNOSIS — R911 Solitary pulmonary nodule: Secondary | ICD-10-CM | POA: Diagnosis not present

## 2023-02-10 DIAGNOSIS — R531 Weakness: Secondary | ICD-10-CM

## 2023-02-10 DIAGNOSIS — G4733 Obstructive sleep apnea (adult) (pediatric): Secondary | ICD-10-CM | POA: Diagnosis not present

## 2023-02-11 ENCOUNTER — Ambulatory Visit
Admission: RE | Admit: 2023-02-11 | Discharge: 2023-02-11 | Disposition: A | Discharge status: Home / Self Care | Payer: PPO | Source: Ambulatory Visit | Attending: Student | Admitting: Student

## 2023-02-11 DIAGNOSIS — I6523 Occlusion and stenosis of bilateral carotid arteries: Secondary | ICD-10-CM | POA: Diagnosis not present

## 2023-02-11 DIAGNOSIS — R531 Weakness: Secondary | ICD-10-CM | POA: Insufficient documentation

## 2023-02-11 DIAGNOSIS — M545 Low back pain, unspecified: Secondary | ICD-10-CM | POA: Diagnosis not present

## 2023-02-13 DIAGNOSIS — M545 Low back pain, unspecified: Secondary | ICD-10-CM | POA: Diagnosis not present

## 2023-02-18 DIAGNOSIS — M545 Low back pain, unspecified: Secondary | ICD-10-CM | POA: Diagnosis not present

## 2023-02-19 ENCOUNTER — Other Ambulatory Visit: Payer: Self-pay | Admitting: Otolaryngology

## 2023-02-19 DIAGNOSIS — K118 Other diseases of salivary glands: Secondary | ICD-10-CM

## 2023-02-20 DIAGNOSIS — M545 Low back pain, unspecified: Secondary | ICD-10-CM | POA: Diagnosis not present

## 2023-02-20 NOTE — Progress Notes (Signed)
Simonne Come, MD sent to [facility] for US guided left parotid nodule Bx.  PET CT - image 22, series 607; Brain MRI - image 20, series 118.  Sedation per pt request.  Mckenzie Lawrence

## 2023-02-21 DIAGNOSIS — E1159 Type 2 diabetes mellitus with other circulatory complications: Secondary | ICD-10-CM | POA: Diagnosis not present

## 2023-02-21 DIAGNOSIS — E119 Type 2 diabetes mellitus without complications: Secondary | ICD-10-CM | POA: Diagnosis not present

## 2023-02-21 DIAGNOSIS — Z794 Long term (current) use of insulin: Secondary | ICD-10-CM | POA: Diagnosis not present

## 2023-02-24 DIAGNOSIS — N3020 Other chronic cystitis without hematuria: Secondary | ICD-10-CM | POA: Diagnosis not present

## 2023-02-25 DIAGNOSIS — M545 Low back pain, unspecified: Secondary | ICD-10-CM | POA: Diagnosis not present

## 2023-02-25 NOTE — Progress Notes (Signed)
Patient for US guided Core LT Parotid gland biopsy on Wed 02/26/2023, I called and spoke with the patient on the phone and gave pre-procedure instructions. Pt was made aware to be here at 12:30p and check in at the [facility]. Pt stated understanding.  Called 02/25/2023

## 2023-02-26 ENCOUNTER — Ambulatory Visit
Admission: RE | Admit: 2023-02-26 | Discharge: 2023-02-26 | Disposition: A | Discharge status: Home / Self Care | Payer: PPO | Source: Ambulatory Visit | Attending: Otolaryngology | Admitting: Otolaryngology

## 2023-02-26 DIAGNOSIS — D110 Benign neoplasm of parotid gland: Secondary | ICD-10-CM | POA: Diagnosis not present

## 2023-02-26 DIAGNOSIS — K118 Other diseases of salivary glands: Secondary | ICD-10-CM | POA: Diagnosis not present

## 2023-02-26 MED ORDER — LIDOCAINE HCL (PF) 1 % IJ SOLN
10.0000 mL | Freq: Once | INTRAMUSCULAR | Status: AC
  Administered 2023-02-26: 10 mL via INTRADERMAL
  Filled 2023-02-26: qty 10

## 2023-02-27 LAB — SURGICAL PATHOLOGY

## 2023-02-28 DIAGNOSIS — M545 Low back pain, unspecified: Secondary | ICD-10-CM | POA: Diagnosis not present

## 2023-03-03 ENCOUNTER — Ambulatory Visit
Admission: RE | Admit: 2023-03-03 | Discharge: 2023-03-03 | Disposition: A | Discharge status: Home / Self Care | Payer: PPO | Source: Ambulatory Visit | Attending: Radiation Oncology | Admitting: Radiation Oncology

## 2023-03-03 VITALS — BP 158/68 | HR 79 | Resp 18 | Ht 67.0 in | Wt 156.3 lb

## 2023-03-03 DIAGNOSIS — C3412 Malignant neoplasm of upper lobe, left bronchus or lung: Secondary | ICD-10-CM | POA: Insufficient documentation

## 2023-03-03 DIAGNOSIS — R911 Solitary pulmonary nodule: Secondary | ICD-10-CM

## 2023-03-03 DIAGNOSIS — F17210 Nicotine dependence, cigarettes, uncomplicated: Secondary | ICD-10-CM | POA: Diagnosis not present

## 2023-03-03 NOTE — Consult Note (Signed)
NEW PATIENT EVALUATION  Name: Mckenzie Lawrence  MRN: [id number]  Date:   03/03/2023     DOB: [birth year]   This 77 y.o. female patient presents to the clinic for initial evaluation of stage Ia non-small cell lung cancer of left upper lobe in patient with known COPD.  REFERRING PHYSICIAN: [name], MD  CHIEF COMPLAINT:  Chief Complaint  Patient presents with   Lung Cancer    consult    DIAGNOSIS: The encounter diagnosis was Solitary pulmonary nodule.   PREVIOUS INVESTIGATIONS:  CT scans PET CT scan reviewed Clinical notes reviewed Labs reviewed  HPI: Patient is a 77 year old female previous employee of [employer] who has been followed by Dr. Meredeth Ide for marked shortness of breath and COPD.  She was recently noted to have a lesion in the left upper lobe concerning for malignancy.  There was a new lesion to left upper lobe categorized as suspicious lung RADS 4 BS.  She also was noted to have mild diffuse bronchial wall thickening and mild centrilobular and paraseptal emphysema.  PET scan confirmed mild metabolic activity associated with a suspicious nodule left upper lobe again concerning for bronchogenic carcinoma.  She also had a lesion of her left parotid gland which was recently biopsied and consistent with a Warthin's tumor.  She specifically [place] cough hemoptysis or chest tightness.  PLANNED TREATMENT REGIMEN: SBRT  PAST MEDICAL HISTORY:  has a past medical history of Anemia, Arthritis, Bladder incontinence, Broken foot, Cataracts, bilateral, Chronic kidney insufficiency, COPD (chronic obstructive pulmonary disease) (HCC), Coronary artery disease (04/25/2022), COVID (2021), CVA (cerebral vascular accident) (HCC) (2016), Diabetes mellitus, GERD (gastroesophageal reflux disease), HLD (hyperlipidemia), cardiovascular stress test, Hypertension, Lacunar stroke of left subthalamic region [facility]) (02/2015), Leg pain, Lower back pain, Neuromuscular disorder (HCC), Orthostatic hypotension,  Osteopenia (01/2017), Pancreatitis (10/2021), PCOS (polycystic ovarian syndrome), Personal history of tobacco use, presenting hazards to health (01/09/2015), PONV (postoperative nausea and vomiting), and Sleep apnea.    PAST SURGICAL HISTORY:  Past Surgical History:  Procedure Laterality Date   BIOPSY  08/28/2022   Procedure: BIOPSY;  Surgeon: Kathi Der, MD;  Location: [facility];  Service: Gastroenterology;;   BOTOX INJECTION  01/15/2022   Procedure: BOTOX INJECTION;  Surgeon: Vida Rigger, MD;  Location: [facility];  Service: Gastroenterology;;   BOTOX INJECTION N/A 08/28/2022   Procedure: BOTOX INJECTION;  Surgeon: Kathi Der, MD;  Location: [facility];  Service: Gastroenterology;  Laterality: N/A;   BROW LIFT Bilateral 11/04/2017   Procedure: BLEPHAROPLASTY UPPER EYELID W/EXCESS SKIN;  Surgeon: Imagene Riches, MD;  Location: [facility];  Service: Ophthalmology;  Laterality: Bilateral;  DIABETES-insulin dependent uses CPAP   CARDIAC CATHETERIZATION  20 yrs ago   found nothing   CARPAL TUNNEL RELEASE Bilateral    CATARACT EXTRACTION Bilateral    ELBOW SURGERY Bilateral    ESOPHAGOGASTRODUODENOSCOPY N/A 07/25/2021   Procedure: ESOPHAGOGASTRODUODENOSCOPY (EGD);  Surgeon: Toledo, Boykin Nearing, MD;  Location: [facility];  Service: Gastroenterology;  Laterality: N/A;  IDDM   ESOPHAGOGASTRODUODENOSCOPY N/A 01/15/2022   Procedure: ESOPHAGOGASTRODUODENOSCOPY (EGD);  Surgeon: Vida Rigger, MD;  Location: [facility];  Service: Gastroenterology;  Laterality: N/A;  botox   ESOPHAGOGASTRODUODENOSCOPY (EGD) WITH PROPOFOL N/A 08/28/2022   Procedure: ESOPHAGOGASTRODUODENOSCOPY (EGD) WITH PROPOFOL;  Surgeon: Kathi Der, MD;  Location: [facility];  Service: Gastroenterology;  Laterality: N/A;   FOOT SURGERY     Groin Abscess     HAND SURGERY     KNEE SURGERY Bilateral    LABIAL ABSCESS  LUMBAR LAMINECTOMY/DECOMPRESSION MICRODISCECTOMY Left 05/11/2018   Procedure: LUMBAR  LAMINECTOMY/DECOMPRESSION MICRODISCECTOMY 1 LEVEL- L4-5;  Surgeon: Lucy Chris, MD;  Location: [facility];  Service: Neurosurgery;  Laterality: Left;   LUMBAR LAMINECTOMY/DECOMPRESSION MICRODISCECTOMY Left 05/20/2022   Procedure: MICRODISCECTOMY L3-4;  Surgeon: Tressie Stalker, MD;  Location: [facility];  Service: Neurosurgery;  Laterality: Left;  3C   LUMBAR WOUND DEBRIDEMENT N/A 08/08/2022   Procedure: INCISION AND DRAINAGE OF LUMBAR WOUND;  Surgeon: Tressie Stalker, MD;  Location: [facility] OR;  Service: Neurosurgery;  Laterality: N/A;  3C   OOPHORECTOMY     BSO   PUBO VAG SLING     SHOULDER SURGERY     bilateral arthroscopies   VAGINAL HYSTERECTOMY  1979    FAMILY HISTORY: family history includes Cancer in her sister; Diabetes in her brother, brother, father, and sister; Heart disease (age of onset: 64) in her brother; Heart disease (age of onset: 22) in her mother; Hypertension in her brother, mother, and sister.  SOCIAL HISTORY:  reports that she has been smoking cigarettes. She has a 75 pack-year smoking history. She has never used smokeless tobacco. She reports that she does not drink alcohol and does not use drugs.  ALLERGIES: Iodine, Shellfish allergy, Codeine, Morphine sulfate, Irbesartan, and Sulfa antibiotics  MEDICATIONS:  Current Outpatient Medications  Medication Sig Dispense Refill   acetaminophen (TYLENOL) 500 MG tablet Take 1,000 mg by mouth every 6 (six) hours as needed for moderate pain.     aspirin EC 81 MG tablet Take 81 mg by mouth at bedtime. Swallow whole.     BD INSULIN SYRINGE U/F 31G X 5/16" 1 ML MISC USE 1 SYRINGE AS DIRECTED     calcitRIOL (ROCALTROL) 0.25 MCG capsule Take 0.25 mcg by mouth at bedtime.     clopidogrel (PLAVIX) 75 MG tablet Take 75 mg by mouth at bedtime.     cyclobenzaprine (FLEXERIL) 10 MG tablet Take 1 tablet (10 mg total) by mouth 3 (three) times daily as needed for muscle spasms. 30 tablet 0   D-MANNOSE PO Take 2 tablets by mouth at bedtime.      diazepam (VALIUM) 10 MG tablet Take 10 mg by mouth every 6 (six) hours as needed for anxiety.     diphenhydrAMINE (BENADRYL) 25 MG tablet Take 50 mg by mouth at bedtime as needed for sleep.     docusate sodium (COLACE) 100 MG capsule Take 1 capsule (100 mg total) by mouth 2 (two) times daily. (Patient taking differently: Take 100 mg by mouth at bedtime as needed for mild constipation or moderate constipation.) 30 capsule 0   estradiol (ESTRACE) 0.1 MG/GM vaginal cream Place 1 Applicatorful vaginally 2 (two) times a week.     feeding supplement (BOOST HIGH PROTEIN) LIQD Take 1 Container by mouth 2 (two) times daily between meals.     gabapentin (NEURONTIN) 100 MG capsule Take 1 capsule (100 mg total) by mouth 3 (three) times daily. Start with 1 capsule at bedtime and advance to 3 capsules as tolerated. (Patient taking differently: Take 100 mg by mouth daily as needed (pain).) 90 capsule 0   HYDROmorphone (DILAUDID) 2 MG tablet Take 1-2 tablets (2-4 mg total) by mouth every 4 (four) hours as needed for severe pain. 30 tablet 0   hydrOXYzine (ATARAX) 50 MG tablet Take 50 mg by mouth at bedtime.     insulin detemir (LEVEMIR) 100 UNIT/ML injection Inject 0.3-0.6 mLs (30-60 Units total) into the skin daily. (Patient taking differently: Inject 15 Units into the  skin in the morning.) 10 mL 11   losartan (COZAAR) 100 MG tablet Take 1 tablet (100 mg total) by mouth daily. (Patient taking differently: Take 100 mg by mouth at bedtime.) 30 tablet 2   metoprolol succinate (TOPROL-XL) 100 MG 24 hr tablet Take 100 mg by mouth at bedtime.     Multiple Vitamins-Minerals (PRESERVISION AREDS 2+MULTI VIT PO) Take 1 capsule by mouth daily.     nicotine (NICODERM CQ - DOSED IN MG/24 HOURS) 21 mg/24hr patch Place 1 patch (21 mg total) onto the skin daily. 28 patch 0   nitrofurantoin, macrocrystal-monohydrate, (MACROBID) 100 MG capsule Take 100 mg by mouth at bedtime.     ondansetron (ZOFRAN) 4 MG tablet Take 1 tablet (4 mg  total) by mouth every 6 (six) hours as needed for nausea or vomiting. 80 tablet 0   ONETOUCH ULTRA test strip 4 (four) times daily.     oxyCODONE (OXY IR/ROXICODONE) 5 MG immediate release tablet Take 1-2 tablets (5-10 mg total) by mouth every 4 (four) hours as needed for moderate pain ((score 4 to 6)). 30 tablet 0   pantoprazole (PROTONIX) 40 MG tablet Take 40 mg by mouth 2 (two) times daily.     PERCOCET 5-325 MG tablet take 1-2 tablet by oral route  every 6 hours as needed     phenazopyridine (PYRIDIUM) 200 MG tablet Take 200 mg by mouth 3 (three) times daily as needed.     sennosides-docusate sodium (SENOKOT-S) 8.6-50 MG tablet Take 1 tablet by mouth daily as needed for constipation.     sucralfate (CARAFATE) 1 g tablet Take 1 g by mouth 2 (two) times daily as needed (acid reflux).     Vibegron (GEMTESA) 75 MG TABS Take 75 mg by mouth at bedtime.     zolpidem (AMBIEN) 10 MG tablet Take 10 mg by mouth at bedtime as needed for sleep.     No current facility-administered medications for this encounter.    ECOG PERFORMANCE STATUS:  0 - Asymptomatic  REVIEW OF SYSTEMS: Patient denies any weight loss, fatigue, weakness, fever, chills or night sweats. Patient denies any loss of vision, blurred vision. Patient denies any ringing  of the ears or hearing loss. No irregular heartbeat. Patient denies heart murmur or history of fainting. Patient denies any chest pain or pain radiating to her upper extremities. Patient denies any shortness of breath, difficulty breathing at night, cough or hemoptysis. Patient denies any swelling in the lower legs. Patient denies any nausea vomiting, vomiting of blood, or coffee ground material in the vomitus. Patient denies any stomach pain. Patient states has had normal bowel movements no significant constipation or diarrhea. Patient denies any dysuria, hematuria or significant nocturia. Patient denies any problems walking, swelling in the joints or loss of balance. Patient  denies any skin changes, loss of hair or loss of weight. Patient denies any excessive worrying or anxiety or significant depression. Patient denies any problems with insomnia. Patient denies excessive thirst, polyuria, polydipsia. Patient denies any swollen glands, patient denies easy bruising or easy bleeding. Patient denies any recent infections, allergies or URI. Patient "s visual fields have not changed significantly in recent time.   PHYSICAL EXAM: BP (!) 158/68   Pulse 79   Resp 18   Ht 5\' 7"  (1.702 m)   Wt 156 lb 4.8 oz (70.9 kg)   BMI 24.48 kg/m  Well-developed well-nourished patient in NAD. HEENT reveals PERLA, EOMI, discs not visualized.  Oral cavity is clear. No oral mucosal  lesions are identified. Neck is clear without evidence of cervical or supraclavicular adenopathy. Lungs are clear to A&P. Cardiac examination is essentially unremarkable with regular rate and rhythm without murmur rub or thrill. Abdomen is benign with no organomegaly or masses noted. Motor sensory and DTR levels are equal and symmetric in the upper and lower extremities. Cranial nerves II through XII are grossly intact. Proprioception is intact. No peripheral adenopathy or edema is identified. No motor or sensory levels are noted. Crude visual fields are within normal range.  LABORATORY DATA: Labs reviewed pathology reports reviewed    RADIOLOGY RESULTS: CT scan and PET scan reviewed compatible with above-stated findings   IMPRESSION: Stage I non-small cell lung cancer left upper lobe in 77 year old female with COPD emphysema.  PLAN: This time I have discussed the options with the patient.  We could wait another 3 months to repeat her CT scan or proceed at this time based on the evidence of hypermetabolic activity on PET scan even though this is such a small lesion as well as new findings on her CT scan that this is a stage I non-small cell lung cancer.  Patient would like to proceed with SBRT treatment.  Would  plan on delivering 60 Gray in 5 fractions.  Risks and benefits of treatment occluding lung scarring possible development of cough possible fatigue although extremely low side effect profile for this procedure.  Patient would like to proceed I have personally ordered's SBRT treatment planning and delivery simulation this week.  Patient comprehends my recommendations well.  I would like to take this opportunity to thank you for allowing me to participate in the care of your patient.Carmina Miller, MD

## 2023-03-04 DIAGNOSIS — M545 Low back pain, unspecified: Secondary | ICD-10-CM | POA: Diagnosis not present

## 2023-03-05 ENCOUNTER — Ambulatory Visit
Admission: RE | Admit: 2023-03-05 | Discharge: 2023-03-05 | Disposition: A | Discharge status: Home / Self Care | Payer: PPO | Source: Ambulatory Visit | Attending: Radiation Oncology | Admitting: Radiation Oncology

## 2023-03-05 DIAGNOSIS — C3412 Malignant neoplasm of upper lobe, left bronchus or lung: Secondary | ICD-10-CM | POA: Diagnosis not present

## 2023-03-05 DIAGNOSIS — F17210 Nicotine dependence, cigarettes, uncomplicated: Secondary | ICD-10-CM | POA: Diagnosis not present

## 2023-03-06 DIAGNOSIS — M545 Low back pain, unspecified: Secondary | ICD-10-CM | POA: Diagnosis not present

## 2023-03-07 ENCOUNTER — Ambulatory Visit
Admission: RE | Admit: 2023-03-07 | Discharge: 2023-03-07 | Disposition: A | Discharge status: Home / Self Care | Payer: PPO | Source: Ambulatory Visit | Attending: Student | Admitting: Student

## 2023-03-07 DIAGNOSIS — R221 Localized swelling, mass and lump, neck: Secondary | ICD-10-CM | POA: Diagnosis not present

## 2023-03-07 DIAGNOSIS — D490 Neoplasm of unspecified behavior of digestive system: Secondary | ICD-10-CM

## 2023-03-07 MED ORDER — GADOPICLENOL 0.5 MMOL/ML IV SOLN
7.5000 mL | Freq: Once | INTRAVENOUS | Status: AC | PRN
  Administered 2023-03-07: 7 mL via INTRAVENOUS

## 2023-03-10 DIAGNOSIS — F17210 Nicotine dependence, cigarettes, uncomplicated: Secondary | ICD-10-CM | POA: Diagnosis not present

## 2023-03-10 DIAGNOSIS — C3412 Malignant neoplasm of upper lobe, left bronchus or lung: Secondary | ICD-10-CM | POA: Diagnosis not present

## 2023-03-17 ENCOUNTER — Ambulatory Visit: Payer: PPO

## 2023-03-17 DIAGNOSIS — E871 Hypo-osmolality and hyponatremia: Secondary | ICD-10-CM | POA: Diagnosis not present

## 2023-03-17 DIAGNOSIS — N184 Chronic kidney disease, stage 4 (severe): Secondary | ICD-10-CM | POA: Diagnosis not present

## 2023-03-17 DIAGNOSIS — N2581 Secondary hyperparathyroidism of renal origin: Secondary | ICD-10-CM | POA: Diagnosis not present

## 2023-03-17 DIAGNOSIS — E1122 Type 2 diabetes mellitus with diabetic chronic kidney disease: Secondary | ICD-10-CM | POA: Diagnosis not present

## 2023-03-17 DIAGNOSIS — I10 Essential (primary) hypertension: Secondary | ICD-10-CM | POA: Diagnosis not present

## 2023-03-17 DIAGNOSIS — M545 Low back pain, unspecified: Secondary | ICD-10-CM | POA: Diagnosis not present

## 2023-03-17 DIAGNOSIS — R809 Proteinuria, unspecified: Secondary | ICD-10-CM | POA: Diagnosis not present

## 2023-03-17 DIAGNOSIS — N1832 Chronic kidney disease, stage 3b: Secondary | ICD-10-CM | POA: Diagnosis not present

## 2023-03-17 DIAGNOSIS — D631 Anemia in chronic kidney disease: Secondary | ICD-10-CM | POA: Diagnosis not present

## 2023-03-19 ENCOUNTER — Ambulatory Visit: Payer: PPO

## 2023-03-19 ENCOUNTER — Encounter: Payer: Self-pay | Admitting: Podiatry

## 2023-03-19 ENCOUNTER — Ambulatory Visit (INDEPENDENT_AMBULATORY_CARE_PROVIDER_SITE_OTHER): Payer: PPO | Admitting: Podiatry

## 2023-03-19 DIAGNOSIS — M545 Low back pain, unspecified: Secondary | ICD-10-CM | POA: Diagnosis not present

## 2023-03-19 DIAGNOSIS — M79676 Pain in unspecified toe(s): Secondary | ICD-10-CM

## 2023-03-19 DIAGNOSIS — E1142 Type 2 diabetes mellitus with diabetic polyneuropathy: Secondary | ICD-10-CM

## 2023-03-19 DIAGNOSIS — B351 Tinea unguium: Secondary | ICD-10-CM | POA: Diagnosis not present

## 2023-03-19 DIAGNOSIS — D689 Coagulation defect, unspecified: Secondary | ICD-10-CM | POA: Diagnosis not present

## 2023-03-19 NOTE — Progress Notes (Signed)
She presents today chief complaint of painful elongated toenails.  Objective: Pulses are palpable.  Diabetic peripheral neuropathy bilateral.  Toenails are long thick yellow dystrophic length mycotic no open lesions or wounds.  Assessment: Pain in limb secondary to onychomycosis diabetic peripheral neuropathy.  Plan: Debrided toenails 1 through 5 bilateral follow-up as needed

## 2023-03-21 DIAGNOSIS — M545 Low back pain, unspecified: Secondary | ICD-10-CM | POA: Diagnosis not present

## 2023-03-24 ENCOUNTER — Other Ambulatory Visit: Payer: Self-pay

## 2023-03-24 ENCOUNTER — Ambulatory Visit
Admission: RE | Admit: 2023-03-24 | Discharge: 2023-03-24 | Disposition: A | Discharge status: Home / Self Care | Payer: PPO | Source: Ambulatory Visit | Attending: Radiation Oncology | Admitting: Radiation Oncology

## 2023-03-24 DIAGNOSIS — R809 Proteinuria, unspecified: Secondary | ICD-10-CM | POA: Diagnosis not present

## 2023-03-24 DIAGNOSIS — Z720 Tobacco use: Secondary | ICD-10-CM | POA: Diagnosis not present

## 2023-03-24 DIAGNOSIS — I10 Essential (primary) hypertension: Secondary | ICD-10-CM | POA: Diagnosis not present

## 2023-03-24 DIAGNOSIS — F418 Other specified anxiety disorders: Secondary | ICD-10-CM | POA: Diagnosis not present

## 2023-03-24 DIAGNOSIS — R238 Other skin changes: Secondary | ICD-10-CM | POA: Diagnosis not present

## 2023-03-24 DIAGNOSIS — D631 Anemia in chronic kidney disease: Secondary | ICD-10-CM | POA: Diagnosis not present

## 2023-03-24 DIAGNOSIS — L732 Hidradenitis suppurativa: Secondary | ICD-10-CM | POA: Diagnosis not present

## 2023-03-24 DIAGNOSIS — G4700 Insomnia, unspecified: Secondary | ICD-10-CM | POA: Diagnosis not present

## 2023-03-24 DIAGNOSIS — B078 Other viral warts: Secondary | ICD-10-CM | POA: Diagnosis not present

## 2023-03-24 DIAGNOSIS — N184 Chronic kidney disease, stage 4 (severe): Secondary | ICD-10-CM | POA: Diagnosis not present

## 2023-03-24 DIAGNOSIS — N2581 Secondary hyperparathyroidism of renal origin: Secondary | ICD-10-CM | POA: Diagnosis not present

## 2023-03-24 DIAGNOSIS — C3412 Malignant neoplasm of upper lobe, left bronchus or lung: Secondary | ICD-10-CM | POA: Insufficient documentation

## 2023-03-24 DIAGNOSIS — Z794 Long term (current) use of insulin: Secondary | ICD-10-CM | POA: Diagnosis not present

## 2023-03-24 DIAGNOSIS — J449 Chronic obstructive pulmonary disease, unspecified: Secondary | ICD-10-CM | POA: Diagnosis not present

## 2023-03-24 DIAGNOSIS — E1122 Type 2 diabetes mellitus with diabetic chronic kidney disease: Secondary | ICD-10-CM | POA: Diagnosis not present

## 2023-03-24 DIAGNOSIS — E119 Type 2 diabetes mellitus without complications: Secondary | ICD-10-CM | POA: Diagnosis not present

## 2023-03-24 DIAGNOSIS — E1129 Type 2 diabetes mellitus with other diabetic kidney complication: Secondary | ICD-10-CM | POA: Diagnosis not present

## 2023-03-24 LAB — RAD ONC ARIA SESSION SUMMARY
Course Elapsed Days: 0
Plan Fractions Treated to Date: 1
Plan Prescribed Dose Per Fraction: 12 Gy
Plan Total Fractions Prescribed: 5
Plan Total Prescribed Dose: 60 Gy
Reference Point Dosage Given to Date: 12 Gy
Reference Point Session Dosage Given: 12 Gy
Session Number: 1

## 2023-03-26 ENCOUNTER — Other Ambulatory Visit: Payer: Self-pay

## 2023-03-26 ENCOUNTER — Ambulatory Visit
Admission: RE | Admit: 2023-03-26 | Discharge: 2023-03-26 | Disposition: A | Discharge status: Home / Self Care | Payer: PPO | Source: Ambulatory Visit | Attending: Radiation Oncology | Admitting: Radiation Oncology

## 2023-03-26 DIAGNOSIS — C3412 Malignant neoplasm of upper lobe, left bronchus or lung: Secondary | ICD-10-CM | POA: Diagnosis not present

## 2023-03-26 LAB — RAD ONC ARIA SESSION SUMMARY
Course Elapsed Days: 2
Plan Fractions Treated to Date: 2
Plan Prescribed Dose Per Fraction: 12 Gy
Plan Total Fractions Prescribed: 5
Plan Total Prescribed Dose: 60 Gy
Reference Point Dosage Given to Date: 24 Gy
Reference Point Session Dosage Given: 12 Gy
Session Number: 2

## 2023-03-31 ENCOUNTER — Other Ambulatory Visit: Payer: Self-pay | Admitting: *Deleted

## 2023-03-31 ENCOUNTER — Other Ambulatory Visit: Payer: Self-pay

## 2023-03-31 ENCOUNTER — Ambulatory Visit
Admission: RE | Admit: 2023-03-31 | Discharge: 2023-03-31 | Disposition: A | Discharge status: Home / Self Care | Payer: PPO | Source: Ambulatory Visit | Attending: Radiation Oncology | Admitting: Radiation Oncology

## 2023-03-31 DIAGNOSIS — C3412 Malignant neoplasm of upper lobe, left bronchus or lung: Secondary | ICD-10-CM | POA: Diagnosis not present

## 2023-03-31 DIAGNOSIS — R911 Solitary pulmonary nodule: Secondary | ICD-10-CM

## 2023-03-31 LAB — RAD ONC ARIA SESSION SUMMARY
Course Elapsed Days: 7
Plan Fractions Treated to Date: 3
Plan Prescribed Dose Per Fraction: 12 Gy
Plan Total Fractions Prescribed: 5
Plan Total Prescribed Dose: 60 Gy
Reference Point Dosage Given to Date: 36 Gy
Reference Point Session Dosage Given: 12 Gy
Session Number: 3

## 2023-04-02 ENCOUNTER — Ambulatory Visit
Admission: RE | Admit: 2023-04-02 | Discharge: 2023-04-02 | Disposition: A | Discharge status: Home / Self Care | Payer: PPO | Source: Ambulatory Visit | Attending: Radiation Oncology | Admitting: Radiation Oncology

## 2023-04-02 ENCOUNTER — Other Ambulatory Visit: Payer: Self-pay

## 2023-04-02 DIAGNOSIS — C3412 Malignant neoplasm of upper lobe, left bronchus or lung: Secondary | ICD-10-CM | POA: Diagnosis not present

## 2023-04-02 LAB — RAD ONC ARIA SESSION SUMMARY
Course Elapsed Days: 9
Plan Fractions Treated to Date: 4
Plan Prescribed Dose Per Fraction: 12 Gy
Plan Total Fractions Prescribed: 5
Plan Total Prescribed Dose: 60 Gy
Reference Point Dosage Given to Date: 48 Gy
Reference Point Session Dosage Given: 12 Gy
Session Number: 4

## 2023-04-03 ENCOUNTER — Encounter: Payer: Self-pay | Admitting: Oncology

## 2023-04-03 ENCOUNTER — Inpatient Hospital Stay: Payer: PPO | Attending: Oncology | Admitting: Oncology

## 2023-04-03 ENCOUNTER — Inpatient Hospital Stay: Payer: PPO

## 2023-04-03 VITALS — BP 211/64 | HR 77 | Temp 97.5°F | Resp 18 | Ht 67.0 in | Wt 159.0 lb

## 2023-04-03 DIAGNOSIS — F17210 Nicotine dependence, cigarettes, uncomplicated: Secondary | ICD-10-CM | POA: Insufficient documentation

## 2023-04-03 DIAGNOSIS — D649 Anemia, unspecified: Secondary | ICD-10-CM | POA: Insufficient documentation

## 2023-04-03 DIAGNOSIS — D110 Benign neoplasm of parotid gland: Secondary | ICD-10-CM | POA: Diagnosis not present

## 2023-04-03 DIAGNOSIS — E871 Hypo-osmolality and hyponatremia: Secondary | ICD-10-CM | POA: Diagnosis not present

## 2023-04-03 DIAGNOSIS — I129 Hypertensive chronic kidney disease with stage 1 through stage 4 chronic kidney disease, or unspecified chronic kidney disease: Secondary | ICD-10-CM | POA: Insufficient documentation

## 2023-04-03 DIAGNOSIS — R911 Solitary pulmonary nodule: Secondary | ICD-10-CM | POA: Diagnosis not present

## 2023-04-03 DIAGNOSIS — N189 Chronic kidney disease, unspecified: Secondary | ICD-10-CM | POA: Diagnosis not present

## 2023-04-03 DIAGNOSIS — E1122 Type 2 diabetes mellitus with diabetic chronic kidney disease: Secondary | ICD-10-CM | POA: Diagnosis not present

## 2023-04-03 NOTE — Progress Notes (Signed)
[facility]  Telephone:([phone]) [phone] Fax:(336) [phone]  ID: Mckenzie Lawrence OB: 1945-08-12  MR#: [id number]  QIO#:[id number]  Patient Care Team: [name], MD as PCP - General (Family Medicine) [name], MD as Consulting Physician (Oncology)  CHIEF COMPLAINT: Left upper lobe pulmonary nodule consistent with underlying malignancy.  INTERVAL HISTORY: Patient is a 77 year old female with a longstanding history of COPD who was noted to have a hypermetabolic left lower lobe lesion on recent PET scan.  No biopsy was completed and patient proceeded directly to treatment with SBRT.  She currently feels well and is asymptomatic.  She is tolerating her treatment without significant side effects.  She has no neurologic complaints.  She denies any recent fevers or illnesses.  She has good appetite and denies weight loss.  She has no chest pain, shortness of breath, cough, or hemoptysis.  She denies any nausea, vomiting, constipation, or diarrhea.  She has no urinary complaints.  Patient offers no specific complaints today.  REVIEW OF SYSTEMS:   Review of Systems  Constitutional: Negative.  Negative for fever, malaise/fatigue and weight loss.  Respiratory: Negative.  Negative for cough, hemoptysis and shortness of breath.   Cardiovascular: Negative.  Negative for chest pain and leg swelling.  Gastrointestinal: Negative.  Negative for abdominal pain.  Genitourinary: Negative.  Negative for dysuria.  Musculoskeletal: Negative.  Negative for back pain.  Skin: Negative.  Negative for rash.  Neurological: Negative.  Negative for dizziness, focal weakness, weakness and headaches.  Psychiatric/Behavioral: Negative.  The patient is not nervous/anxious.     As per HPI. Otherwise, a complete review of systems is negative.  PAST MEDICAL HISTORY: Past Medical History:  Diagnosis Date   Anemia    Arthritis    Bladder incontinence    Broken foot    Cataracts, bilateral     Chronic kidney insufficiency    stage 3b   COPD (chronic obstructive pulmonary disease) (HCC)    wheezing   Coronary artery disease 04/25/2022   in [place]   COVID 2021   very mild case   CVA (cerebral vascular accident) (HCC) 2016   has had 3 strokes, states right side is slightlyweaker than left   Diabetes mellitus    insulin dependent, Type 2   GERD (gastroesophageal reflux disease)    HLD (hyperlipidemia)    Hx of cardiovascular stress test    a. ETT (6/13):  Ex 5:13; no ischemic changes   Hypertension    controlled on meds   Lacunar stroke of left subthalamic region [facility]) 02/2015   Leg pain    left   Lower back pain    Neuromuscular disorder (HCC)    stroke right hand tingling   Orthostatic hypotension    Osteopenia 01/2017   T score -2.0 stable from prior DEXA   Pancreatitis 10/2021   PCOS (polycystic ovarian syndrome)    Personal history of tobacco use, presenting hazards to health 01/09/2015   PONV (postoperative nausea and vomiting)    Sleep apnea    uses CPAP nightly    PAST SURGICAL HISTORY: Past Surgical History:  Procedure Laterality Date   BIOPSY  08/28/2022   Procedure: BIOPSY;  Surgeon: Kathi Der, MD;  Location: [facility];  Service: Gastroenterology;;   BOTOX INJECTION  01/15/2022   Procedure: BOTOX INJECTION;  Surgeon: Vida Rigger, MD;  Location: [facility];  Service: Gastroenterology;;   BOTOX INJECTION N/A 08/28/2022   Procedure: BOTOX INJECTION;  Surgeon: Kathi Der, MD;  Location: [facility];  Service: Gastroenterology;  Laterality: N/A;   BROW LIFT Bilateral 11/04/2017   Procedure: BLEPHAROPLASTY UPPER EYELID W/EXCESS SKIN;  Surgeon: Imagene Riches, MD;  Location: [facility];  Service: Ophthalmology;  Laterality: Bilateral;  DIABETES-insulin dependent uses CPAP   CARDIAC CATHETERIZATION  20 yrs ago   found nothing   CARPAL TUNNEL RELEASE Bilateral    CATARACT EXTRACTION Bilateral    ELBOW SURGERY Bilateral     ESOPHAGOGASTRODUODENOSCOPY N/A 07/25/2021   Procedure: ESOPHAGOGASTRODUODENOSCOPY (EGD);  Surgeon: Toledo, Boykin Nearing, MD;  Location: [facility] ENDOSCOPY;  Service: Gastroenterology;  Laterality: N/A;  IDDM   ESOPHAGOGASTRODUODENOSCOPY N/A 01/15/2022   Procedure: ESOPHAGOGASTRODUODENOSCOPY (EGD);  Surgeon: Vida Rigger, MD;  Location: [facility];  Service: Gastroenterology;  Laterality: N/A;  botox   ESOPHAGOGASTRODUODENOSCOPY (EGD) WITH PROPOFOL N/A 08/28/2022   Procedure: ESOPHAGOGASTRODUODENOSCOPY (EGD) WITH PROPOFOL;  Surgeon: Kathi Der, MD;  Location: [facility];  Service: Gastroenterology;  Laterality: N/A;   FOOT SURGERY     Groin Abscess     HAND SURGERY     KNEE SURGERY Bilateral    LABIAL ABSCESS     LUMBAR LAMINECTOMY/DECOMPRESSION MICRODISCECTOMY Left 05/11/2018   Procedure: LUMBAR LAMINECTOMY/DECOMPRESSION MICRODISCECTOMY 1 LEVEL- L4-5;  Surgeon: Lucy Chris, MD;  Location: [facility] ORS;  Service: Neurosurgery;  Laterality: Left;   LUMBAR LAMINECTOMY/DECOMPRESSION MICRODISCECTOMY Left 05/20/2022   Procedure: MICRODISCECTOMY L3-4;  Surgeon: Tressie Stalker, MD;  Location: [facility] OR;  Service: Neurosurgery;  Laterality: Left;  3C   LUMBAR WOUND DEBRIDEMENT N/A 08/08/2022   Procedure: INCISION AND DRAINAGE OF LUMBAR WOUND;  Surgeon: Tressie Stalker, MD;  Location: [facility] OR;  Service: Neurosurgery;  Laterality: N/A;  3C   OOPHORECTOMY     BSO   PUBO VAG SLING     SHOULDER SURGERY     bilateral arthroscopies   VAGINAL HYSTERECTOMY  1979    FAMILY HISTORY: Family History  Problem Relation Age of Onset   Hypertension Mother    Heart disease Mother 67       MI   Diabetes Father    Diabetes Sister    Hypertension Sister    Diabetes Brother    Hypertension Brother    Heart disease Brother 6       CAD   Cancer Sister        Multiple myloma   Diabetes Brother     ADVANCED DIRECTIVES (Y/N):  N  HEALTH MAINTENANCE: Social History   Tobacco Use   Smoking status: Every Day     Current packs/day: 1.50    Average packs/day: 1.5 packs/day for 50.0 years (75.0 ttl pk-yrs)    Types: Cigarettes   Smokeless tobacco: Never   Tobacco comments:    3ppd x 10 year, then cut back to 1.5pdd since 02/2015  Vaping Use   Vaping status: Never Used  Substance Use Topics   Alcohol use: Never    Alcohol/week: 0.0 standard drinks of alcohol   Drug use: No     Colonoscopy:  PAP:  Bone density:  Lipid panel:  Allergies  Allergen Reactions   Iodine Anaphylaxis and Other (See Comments)    Pt states that she is allergic to ingested iodine only, okay for betadine.     Shellfish Allergy Anaphylaxis   Codeine Nausea And Vomiting   Morphine Sulfate Nausea And Vomiting   Irbesartan Other (See Comments)     Unknown  (Avapro)   Sulfa Antibiotics Other (See Comments)    Fever     Current Outpatient Medications  Medication Sig Dispense Refill  acetaminophen (TYLENOL) 500 MG tablet Take 1,000 mg by mouth every 6 (six) hours as needed for moderate pain.     aspirin EC 81 MG tablet Take 81 mg by mouth at bedtime. Swallow whole.     BD INSULIN SYRINGE U/F 31G X 5/16" 1 ML MISC USE 1 SYRINGE AS DIRECTED     calcitRIOL (ROCALTROL) 0.25 MCG capsule Take 0.25 mcg by mouth at bedtime.     clopidogrel (PLAVIX) 75 MG tablet Take 75 mg by mouth at bedtime.     cyclobenzaprine (FLEXERIL) 10 MG tablet Take 1 tablet (10 mg total) by mouth 3 (three) times daily as needed for muscle spasms. 30 tablet 0   D-MANNOSE PO Take 2 tablets by mouth at bedtime.     diazepam (VALIUM) 10 MG tablet Take 10 mg by mouth every 6 (six) hours as needed for anxiety.     diphenhydrAMINE (BENADRYL) 25 MG tablet Take 50 mg by mouth at bedtime as needed for sleep.     docusate sodium (COLACE) 100 MG capsule Take 1 capsule (100 mg total) by mouth 2 (two) times daily. (Patient taking differently: Take 100 mg by mouth at bedtime as needed for mild constipation or moderate constipation.) 30 capsule 0   estradiol (ESTRACE)  0.1 MG/GM vaginal cream Place 1 Applicatorful vaginally 2 (two) times a week.     feeding supplement (BOOST HIGH PROTEIN) LIQD Take 1 Container by mouth 2 (two) times daily between meals.     gabapentin (NEURONTIN) 100 MG capsule Take 1 capsule (100 mg total) by mouth 3 (three) times daily. Start with 1 capsule at bedtime and advance to 3 capsules as tolerated. (Patient taking differently: Take 100 mg by mouth daily as needed (pain).) 90 capsule 0   HYDROmorphone (DILAUDID) 2 MG tablet Take 1-2 tablets (2-4 mg total) by mouth every 4 (four) hours as needed for severe pain. 30 tablet 0   hydrOXYzine (ATARAX) 50 MG tablet Take 50 mg by mouth at bedtime.     insulin detemir (LEVEMIR) 100 UNIT/ML injection Inject 0.3-0.6 mLs (30-60 Units total) into the skin daily. (Patient taking differently: Inject 15 Units into the skin in the morning.) 10 mL 11   losartan (COZAAR) 100 MG tablet Take 1 tablet (100 mg total) by mouth daily. (Patient taking differently: Take 100 mg by mouth at bedtime.) 30 tablet 2   metoprolol succinate (TOPROL-XL) 100 MG 24 hr tablet Take 100 mg by mouth at bedtime.     Multiple Vitamins-Minerals (PRESERVISION AREDS 2+MULTI VIT PO) Take 1 capsule by mouth daily.     nicotine (NICODERM CQ - DOSED IN MG/24 HOURS) 21 mg/24hr patch Place 1 patch (21 mg total) onto the skin daily. 28 patch 0   nitrofurantoin, macrocrystal-monohydrate, (MACROBID) 100 MG capsule Take 100 mg by mouth at bedtime.     ondansetron (ZOFRAN) 4 MG tablet Take 1 tablet (4 mg total) by mouth every 6 (six) hours as needed for nausea or vomiting. 80 tablet 0   ONETOUCH ULTRA test strip 4 (four) times daily.     oxyCODONE (OXY IR/ROXICODONE) 5 MG immediate release tablet Take 1-2 tablets (5-10 mg total) by mouth every 4 (four) hours as needed for moderate pain ((score 4 to 6)). 30 tablet 0   pantoprazole (PROTONIX) 40 MG tablet Take 40 mg by mouth 2 (two) times daily.     PERCOCET 5-325 MG tablet take 1-2 tablet by oral  route  every 6 hours as needed  phenazopyridine (PYRIDIUM) 200 MG tablet Take 200 mg by mouth 3 (three) times daily as needed.     sennosides-docusate sodium (SENOKOT-S) 8.6-50 MG tablet Take 1 tablet by mouth daily as needed for constipation.     sucralfate (CARAFATE) 1 g tablet Take 1 g by mouth 2 (two) times daily as needed (acid reflux).     Vibegron (GEMTESA) 75 MG TABS Take 75 mg by mouth at bedtime.     zolpidem (AMBIEN) 10 MG tablet Take 10 mg by mouth at bedtime as needed for sleep.     No current facility-administered medications for this visit.    OBJECTIVE: Vitals:   04/03/23 1455  BP: (!) 211/64  Pulse: 77  Resp: 18  Temp: (!) 97.5 F (36.4 C)  SpO2: 100%     Body mass index is 24.9 kg/m.    ECOG FS:0 - Asymptomatic  General: Well-developed, well-nourished, no acute distress. Eyes: Pink conjunctiva, anicteric sclera. HEENT: Normocephalic, moist mucous membranes. Lungs: No audible wheezing or coughing. Heart: Regular rate and rhythm. Abdomen: Soft, nontender, no obvious distention. Musculoskeletal: No edema, cyanosis, or clubbing. Neuro: Alert, answering all questions appropriately. Cranial nerves grossly intact. Skin: No rashes or petechiae noted. Psych: Normal affect. Lymphatics: No cervical, calvicular, axillary or inguinal LAD.   LAB RESULTS:  Lab Results  Component Value Date   NA 129 (L) 08/13/2022   K 4.4 08/13/2022   CL 99 08/13/2022   CO2 22 08/13/2022   GLUCOSE 199 (H) 08/13/2022   BUN 24 (H) 08/13/2022   CREATININE 1.84 (H) 08/13/2022   CALCIUM 9.1 08/13/2022   PROT 6.2 (L) 08/08/2022   ALBUMIN 2.3 (L) 08/08/2022   AST 19 08/08/2022   ALT 11 08/08/2022   ALKPHOS 91 08/08/2022   BILITOT 0.5 08/08/2022   GFRNONAA 28 (L) 08/13/2022   GFRAA 43 (L) 05/06/2018    Lab Results  Component Value Date   WBC 11.1 (H) 08/08/2022   NEUTROABS 9.3 (H) 03/15/2015   HGB 9.1 (L) 08/08/2022   HCT 27.0 (L) 08/08/2022   MCV 91.8 08/08/2022   PLT 392  08/08/2022     STUDIES: MR NECK SOFT TISSUE ONLY W WO CONTRAST  Result Date: 03/07/2023 CLINICAL DATA:  Left upper lobe lung mass.  Parotid lesion. EXAM: MRI OF THE NECK WITH CONTRAST TECHNIQUE: Multiplanar, multisequence MR imaging was performed following the administration of intravenous contrast. CONTRAST:  7 mL Vueway COMPARISON:  MR head without contrast 02/04/2023. PET scan 02/03/2023. FINDINGS: Pharynx and larynx: No focal mucosal or submucosal lesions are present. Nasopharynx is clear. Soft palate and tongue base are within normal limits. Vallecula and epiglottis are within normal limits. Aryepiglottic folds and piriform sinuses are clear. Vocal cords are midline and symmetric. Trachea is clear. Salivary glands: A T2 hyperintense enhancing nodule in the inferior aspect of the left parotid gland measures 16 x 13 x 12 mm. No other parotid lesions are present. The submandibular glands and ducts are within normal limits. Thyroid: Normal Lymph nodes: No significant adenopathy is present. Vascular: Flow is present in the major intracranial arteries. Limited intracranial: Unremarkable Visualized orbits: The globes and orbits are within normal limits. Mastoids and visualized paranasal sinuses: The paranasal sinuses and mastoid air cells are clear. Skeleton: Vertebral body heights alignment are normal. Upper chest: The left upper lobe nodule is visualized. Lung apices are otherwise clear. IMPRESSION: 1. 16 x 13 x 12 mm T2 hyperintense enhancing nodule in the inferior aspect of the left parotid is concerning for a primary  parotid neoplasm. 2. No other focal lesions are present. 3. No significant adenopathy. 4. Left upper lobe nodule is visualized. Electronically Signed   By: Marin Roberts M.D.   On: 03/07/2023 17:25    ASSESSMENT: Left upper lobe pulmonary nodule consistent with underlying malignancy  PLAN:    Left upper lobe pulmonary nodule consistent with underlying malignancy: No biopsy was  performed.  Given the hypermetabolism on PET scan from February 02, 2023, patient elected to pursue SBRT.  Her last treatment is on April 07, 2023.  No further intervention is needed.  Will get a repeat PET scan in 3 months to ensure resolution of lesion.  At which point, patient can be followed every 6 months with CT scans.  Return to clinic 1 week after PET scan to discuss the results. 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  No further interventions are needed. Hypertension: Patient's blood pressure is significantly elevated today.  Continue monitoring and treatment per primary care. Renal insufficiency: Patient's most recent creatinine is 1.84.  Follow-up with nephrology as indicated.   Hyponatremia: Chronic and unchanged.  Patient's most recent sodium is 1.32. Anemia: Mild.  Patient's most recent hemoglobin is 11.0.  Patient expressed understanding and was in agreement with this plan. She also understands that She can call clinic at any time with any questions, concerns, or complaints.    Cancer Staging  No matching staging information was found for the patient.   [name], MD   04/03/2023 4:01 PM

## 2023-04-04 DIAGNOSIS — N3000 Acute cystitis without hematuria: Secondary | ICD-10-CM | POA: Diagnosis not present

## 2023-04-07 ENCOUNTER — Ambulatory Visit
Admission: RE | Admit: 2023-04-07 | Discharge: 2023-04-07 | Disposition: A | Discharge status: Home / Self Care | Payer: PPO | Source: Ambulatory Visit | Attending: Radiation Oncology | Admitting: Radiation Oncology

## 2023-04-07 ENCOUNTER — Other Ambulatory Visit: Payer: Self-pay

## 2023-04-07 DIAGNOSIS — F17210 Nicotine dependence, cigarettes, uncomplicated: Secondary | ICD-10-CM | POA: Diagnosis not present

## 2023-04-07 DIAGNOSIS — Z510 Encounter for antineoplastic radiation therapy: Secondary | ICD-10-CM | POA: Diagnosis not present

## 2023-04-07 DIAGNOSIS — Z1231 Encounter for screening mammogram for malignant neoplasm of breast: Secondary | ICD-10-CM | POA: Diagnosis not present

## 2023-04-07 DIAGNOSIS — C3412 Malignant neoplasm of upper lobe, left bronchus or lung: Secondary | ICD-10-CM | POA: Diagnosis not present

## 2023-04-07 LAB — RAD ONC ARIA SESSION SUMMARY
Course Elapsed Days: 14
Plan Fractions Treated to Date: 5
Plan Prescribed Dose Per Fraction: 12 Gy
Plan Total Fractions Prescribed: 5
Plan Total Prescribed Dose: 60 Gy
Reference Point Dosage Given to Date: 60 Gy
Reference Point Session Dosage Given: 12 Gy
Session Number: 5

## 2023-04-08 DIAGNOSIS — S32009K Unspecified fracture of unspecified lumbar vertebra, subsequent encounter for fracture with nonunion: Secondary | ICD-10-CM | POA: Diagnosis not present

## 2023-04-08 DIAGNOSIS — M48062 Spinal stenosis, lumbar region with neurogenic claudication: Secondary | ICD-10-CM | POA: Diagnosis not present

## 2023-04-08 DIAGNOSIS — I639 Cerebral infarction, unspecified: Secondary | ICD-10-CM | POA: Diagnosis not present

## 2023-04-08 DIAGNOSIS — E785 Hyperlipidemia, unspecified: Secondary | ICD-10-CM | POA: Diagnosis not present

## 2023-04-11 ENCOUNTER — Other Ambulatory Visit: Payer: Self-pay | Admitting: Gastroenterology

## 2023-04-14 DIAGNOSIS — H353211 Exudative age-related macular degeneration, right eye, with active choroidal neovascularization: Secondary | ICD-10-CM | POA: Diagnosis not present

## 2023-04-14 DIAGNOSIS — H353221 Exudative age-related macular degeneration, left eye, with active choroidal neovascularization: Secondary | ICD-10-CM | POA: Diagnosis not present

## 2023-04-15 DIAGNOSIS — H353211 Exudative age-related macular degeneration, right eye, with active choroidal neovascularization: Secondary | ICD-10-CM | POA: Diagnosis not present

## 2023-04-17 DIAGNOSIS — M545 Low back pain, unspecified: Secondary | ICD-10-CM | POA: Diagnosis not present

## 2023-04-21 DIAGNOSIS — M545 Low back pain, unspecified: Secondary | ICD-10-CM | POA: Diagnosis not present

## 2023-04-23 ENCOUNTER — Inpatient Hospital Stay: Payer: PPO | Attending: Oncology

## 2023-04-23 DIAGNOSIS — M545 Low back pain, unspecified: Secondary | ICD-10-CM | POA: Diagnosis not present

## 2023-04-23 DIAGNOSIS — R911 Solitary pulmonary nodule: Secondary | ICD-10-CM

## 2023-04-23 NOTE — Progress Notes (Signed)
Multidisciplinary Oncology Council Documentation  Mckenzie Lawrence was presented by our [facility] on 04/23/2023, which included representatives from:  Palliative Care Dietitian  Physical/Occupational Therapist Nurse Navigator Genetics Social work Survivorship RN Financial Navigator Research RN   [name] currently presents with history of lung nodule  We reviewed previous medical and familial history, history of present illness, and recent lab results along with all available histopathologic and imaging studies. The MOC considered available treatment options and made the following recommendations/referrals:  None  The MOC is a meeting of clinicians from various specialty areas who evaluate and discuss patients for whom a multidisciplinary approach is being considered. Final determinations in the plan of care are those of the provider(s).   Today's extended care, comprehensive team conference, [name] was not present for the discussion and was not examined.

## 2023-04-24 DIAGNOSIS — G4733 Obstructive sleep apnea (adult) (pediatric): Secondary | ICD-10-CM | POA: Diagnosis not present

## 2023-04-25 DIAGNOSIS — R102 Pelvic and perineal pain: Secondary | ICD-10-CM | POA: Diagnosis not present

## 2023-04-25 DIAGNOSIS — N3020 Other chronic cystitis without hematuria: Secondary | ICD-10-CM | POA: Diagnosis not present

## 2023-04-25 DIAGNOSIS — N3946 Mixed incontinence: Secondary | ICD-10-CM | POA: Diagnosis not present

## 2023-04-25 DIAGNOSIS — R300 Dysuria: Secondary | ICD-10-CM | POA: Diagnosis not present

## 2023-04-25 DIAGNOSIS — R351 Nocturia: Secondary | ICD-10-CM | POA: Diagnosis not present

## 2023-04-28 DIAGNOSIS — M545 Low back pain, unspecified: Secondary | ICD-10-CM | POA: Diagnosis not present

## 2023-04-28 DIAGNOSIS — B078 Other viral warts: Secondary | ICD-10-CM | POA: Diagnosis not present

## 2023-04-28 DIAGNOSIS — Z131 Encounter for screening for diabetes mellitus: Secondary | ICD-10-CM | POA: Diagnosis not present

## 2023-04-28 DIAGNOSIS — R238 Other skin changes: Secondary | ICD-10-CM | POA: Diagnosis not present

## 2023-04-29 DIAGNOSIS — M545 Low back pain, unspecified: Secondary | ICD-10-CM | POA: Diagnosis not present

## 2023-04-29 DIAGNOSIS — S32009K Unspecified fracture of unspecified lumbar vertebra, subsequent encounter for fracture with nonunion: Secondary | ICD-10-CM | POA: Diagnosis not present

## 2023-04-29 DIAGNOSIS — M4316 Spondylolisthesis, lumbar region: Secondary | ICD-10-CM | POA: Diagnosis not present

## 2023-04-29 DIAGNOSIS — R200 Anesthesia of skin: Secondary | ICD-10-CM | POA: Diagnosis not present

## 2023-05-01 DIAGNOSIS — M545 Low back pain, unspecified: Secondary | ICD-10-CM | POA: Diagnosis not present

## 2023-05-05 DIAGNOSIS — M545 Low back pain, unspecified: Secondary | ICD-10-CM | POA: Diagnosis not present

## 2023-05-05 DIAGNOSIS — H353211 Exudative age-related macular degeneration, right eye, with active choroidal neovascularization: Secondary | ICD-10-CM | POA: Diagnosis not present

## 2023-05-05 DIAGNOSIS — H353221 Exudative age-related macular degeneration, left eye, with active choroidal neovascularization: Secondary | ICD-10-CM | POA: Diagnosis not present

## 2023-05-05 DIAGNOSIS — E113293 Type 2 diabetes mellitus with mild nonproliferative diabetic retinopathy without macular edema, bilateral: Secondary | ICD-10-CM | POA: Diagnosis not present

## 2023-05-08 DIAGNOSIS — F5104 Psychophysiologic insomnia: Secondary | ICD-10-CM | POA: Diagnosis not present

## 2023-05-08 DIAGNOSIS — R531 Weakness: Secondary | ICD-10-CM | POA: Diagnosis not present

## 2023-05-08 DIAGNOSIS — F17200 Nicotine dependence, unspecified, uncomplicated: Secondary | ICD-10-CM | POA: Diagnosis not present

## 2023-05-08 DIAGNOSIS — R42 Dizziness and giddiness: Secondary | ICD-10-CM | POA: Diagnosis not present

## 2023-05-08 DIAGNOSIS — M5416 Radiculopathy, lumbar region: Secondary | ICD-10-CM | POA: Diagnosis not present

## 2023-05-08 DIAGNOSIS — M545 Low back pain, unspecified: Secondary | ICD-10-CM | POA: Diagnosis not present

## 2023-05-08 DIAGNOSIS — Z8673 Personal history of transient ischemic attack (TIA), and cerebral infarction without residual deficits: Secondary | ICD-10-CM | POA: Diagnosis not present

## 2023-05-12 ENCOUNTER — Ambulatory Visit
Admission: RE | Admit: 2023-05-12 | Discharge: 2023-05-12 | Disposition: A | Discharge status: Home / Self Care | Payer: PPO | Source: Ambulatory Visit | Attending: Radiation Oncology | Admitting: Radiation Oncology

## 2023-05-12 ENCOUNTER — Other Ambulatory Visit: Payer: Self-pay | Admitting: *Deleted

## 2023-05-12 VITALS — BP 139/60 | HR 97 | Temp 95.0°F | Resp 17 | Wt 159.0 lb

## 2023-05-12 DIAGNOSIS — R911 Solitary pulmonary nodule: Secondary | ICD-10-CM

## 2023-05-12 DIAGNOSIS — M545 Low back pain, unspecified: Secondary | ICD-10-CM | POA: Diagnosis not present

## 2023-05-12 NOTE — Progress Notes (Signed)
Radiation Oncology Follow up Note  Name: Mckenzie Lawrence   Date:   05/12/2023 MRN:  [id number] DOB: 10/15/1945    This 77 y.o. female presents to the clinic today for 1 month follow-up status post SBRT for stage I non-small cell lung cancer left upper lobe.  REFERRING PROVIDER: [name], MD  HPI: The patient, a 77 year old with a history of COPD and recent completion of SBRT for stage one non-small cell lung cancer of the left upper lobe, reports increased difficulty breathing and productive cough. She was recently treated for bronchitis with a Z-Pak by Dr. Burnett Sheng. The patient also mentions a history of frequent coughing, which has been present over time.  COMPLICATIONS OF TREATMENT: none  FOLLOW UP COMPLIANCE: keeps appointments   PHYSICAL EXAM:  BP 139/60 (Patient Position: Sitting) Comment: refused recheck  Pulse 97   Temp (!) 95 F (35 C) (Tympanic)   Resp 17   Wt 159 lb (72.1 kg)   SpO2 100%   BMI 24.90 kg/m  Well-developed well-nourished patient in NAD. HEENT reveals PERLA, EOMI, discs not visualized.  Oral cavity is clear. No oral mucosal lesions are identified. Neck is clear without evidence of cervical or supraclavicular adenopathy. Lungs are clear to A&P. Cardiac examination is essentially unremarkable with regular rate and rhythm without murmur rub or thrill. Abdomen is benign with no organomegaly or masses noted. Motor sensory and DTR levels are equal and symmetric in the upper and lower extremities. Cranial nerves II through XII are grossly intact. Proprioception is intact. No peripheral adenopathy or edema is identified. No motor or sensory levels are noted. Crude visual fields are within normal range.  RADIOLOGY RESULTS: CT scan ordered in 3 months  PLAN: Stage 1 Non-Small Cell Lung Cancer (NSCLC) Completed SBRT one month ago. No current concerns related to cancer. -Schedule CT chest in 3 months to monitor for changes in scar tissue.  Chronic Obstructive  Pulmonary Disease (COPD) Recent bronchitis treated with antibiotics. Persistent cough,  -Continue current management.  Follow-up in 3 months with a CT scan of the chest one week prior to the appointment.    Carmina Miller, MD

## 2023-05-20 ENCOUNTER — Encounter (HOSPITAL_COMMUNITY): Payer: Self-pay | Admitting: Gastroenterology

## 2023-05-20 DIAGNOSIS — M545 Low back pain, unspecified: Secondary | ICD-10-CM | POA: Diagnosis not present

## 2023-05-21 DIAGNOSIS — N184 Chronic kidney disease, stage 4 (severe): Secondary | ICD-10-CM | POA: Diagnosis not present

## 2023-05-21 DIAGNOSIS — E1122 Type 2 diabetes mellitus with diabetic chronic kidney disease: Secondary | ICD-10-CM | POA: Diagnosis not present

## 2023-05-22 DIAGNOSIS — M545 Low back pain, unspecified: Secondary | ICD-10-CM | POA: Diagnosis not present

## 2023-05-26 DIAGNOSIS — H353211 Exudative age-related macular degeneration, right eye, with active choroidal neovascularization: Secondary | ICD-10-CM | POA: Diagnosis not present

## 2023-05-26 DIAGNOSIS — H353221 Exudative age-related macular degeneration, left eye, with active choroidal neovascularization: Secondary | ICD-10-CM | POA: Diagnosis not present

## 2023-05-27 ENCOUNTER — Other Ambulatory Visit: Payer: Self-pay

## 2023-05-27 ENCOUNTER — Ambulatory Visit (HOSPITAL_COMMUNITY)
Admission: RE | Admit: 2023-05-27 | Discharge: 2023-05-27 | Disposition: A | Discharge status: Home / Self Care | Payer: PPO | Attending: Gastroenterology | Admitting: Gastroenterology

## 2023-05-27 ENCOUNTER — Encounter (HOSPITAL_COMMUNITY): Payer: Self-pay | Admitting: Gastroenterology

## 2023-05-27 ENCOUNTER — Encounter (HOSPITAL_COMMUNITY)
Admission: RE | Disposition: A | Discharge status: Home / Self Care | Payer: Self-pay | Source: Home / Self Care | Attending: Gastroenterology

## 2023-05-27 ENCOUNTER — Ambulatory Visit (HOSPITAL_COMMUNITY): Payer: PPO | Admitting: Certified Registered Nurse Anesthetist

## 2023-05-27 DIAGNOSIS — K220 Achalasia of cardia: Secondary | ICD-10-CM

## 2023-05-27 DIAGNOSIS — I2510 Atherosclerotic heart disease of native coronary artery without angina pectoris: Secondary | ICD-10-CM | POA: Diagnosis not present

## 2023-05-27 DIAGNOSIS — E1122 Type 2 diabetes mellitus with diabetic chronic kidney disease: Secondary | ICD-10-CM | POA: Diagnosis not present

## 2023-05-27 DIAGNOSIS — R1310 Dysphagia, unspecified: Secondary | ICD-10-CM | POA: Diagnosis not present

## 2023-05-27 DIAGNOSIS — K449 Diaphragmatic hernia without obstruction or gangrene: Secondary | ICD-10-CM | POA: Diagnosis not present

## 2023-05-27 DIAGNOSIS — N184 Chronic kidney disease, stage 4 (severe): Secondary | ICD-10-CM | POA: Diagnosis not present

## 2023-05-27 DIAGNOSIS — Z7902 Long term (current) use of antithrombotics/antiplatelets: Secondary | ICD-10-CM | POA: Insufficient documentation

## 2023-05-27 DIAGNOSIS — I129 Hypertensive chronic kidney disease with stage 1 through stage 4 chronic kidney disease, or unspecified chronic kidney disease: Secondary | ICD-10-CM | POA: Diagnosis not present

## 2023-05-27 HISTORY — PX: ESOPHAGOGASTRODUODENOSCOPY (EGD) WITH PROPOFOL: SHX5813

## 2023-05-27 HISTORY — PX: BOTOX INJECTION: SHX5754

## 2023-05-27 LAB — POCT I-STAT, CHEM 8
BUN: 42 mg/dL — ABNORMAL HIGH (ref 8–23)
Calcium, Ion: 1.2 mmol/L (ref 1.15–1.40)
Chloride: 104 mmol/L (ref 98–111)
Creatinine, Ser: 2.7 mg/dL — ABNORMAL HIGH (ref 0.44–1.00)
Glucose, Bld: 123 mg/dL — ABNORMAL HIGH (ref 70–99)
HCT: 30 % — ABNORMAL LOW (ref 36.0–46.0)
Hemoglobin: 10.2 g/dL — ABNORMAL LOW (ref 12.0–15.0)
Potassium: 4.5 mmol/L (ref 3.5–5.1)
Sodium: 136 mmol/L (ref 135–145)
TCO2: 21 mmol/L — ABNORMAL LOW (ref 22–32)

## 2023-05-27 LAB — GLUCOSE, CAPILLARY: Glucose-Capillary: 126 mg/dL — ABNORMAL HIGH (ref 70–99)

## 2023-05-27 SURGERY — ESOPHAGOGASTRODUODENOSCOPY (EGD) WITH PROPOFOL
Anesthesia: Monitor Anesthesia Care | Laterality: Bilateral

## 2023-05-27 MED ORDER — SODIUM CHLORIDE (PF) 0.9 % IJ SOLN
INTRAMUSCULAR | Status: AC
  Filled 2023-05-27: qty 10

## 2023-05-27 MED ORDER — ONABOTULINUMTOXINA 100 UNITS IJ SOLR
INTRAMUSCULAR | Status: AC
  Filled 2023-05-27: qty 100

## 2023-05-27 MED ORDER — SODIUM CHLORIDE 0.9 % IV SOLN: INTRAVENOUS | Status: DC

## 2023-05-27 MED ORDER — PROPOFOL 500 MG/50ML IV EMUL
INTRAVENOUS | Status: DC | PRN
  Administered 2023-05-27: 125 ug/kg/min via INTRAVENOUS

## 2023-05-27 MED ORDER — SODIUM CHLORIDE 0.9 % IV SOLN: INTRAVENOUS | Status: DC | PRN

## 2023-05-27 MED ORDER — LIDOCAINE HCL (CARDIAC) PF 100 MG/5ML IV SOSY
PREFILLED_SYRINGE | INTRAVENOUS | Status: DC | PRN
  Administered 2023-05-27: 80 mg via INTRATRACHEAL

## 2023-05-27 MED ORDER — SODIUM CHLORIDE (PF) 0.9 % IJ SOLN
INTRAMUSCULAR | Status: DC | PRN
  Administered 2023-05-27: 4 mL via SUBMUCOSAL

## 2023-05-27 SURGICAL SUPPLY — 14 items

## 2023-05-27 NOTE — Discharge Instructions (Addendum)
YOU HAD AN ENDOSCOPIC PROCEDURE TODAY: Refer to the procedure report and other information in the discharge instructions given to you for any specific questions about what was found during the examination. If this information does not answer your questions, please call [facility] office at [phone] to clarify.   YOU SHOULD EXPECT: Some feelings of bloating in the abdomen. Passage of more gas than usual. Walking can help get rid of the air that was put into your GI tract during the procedure and reduce the bloating. If you had a lower endoscopy (such as a colonoscopy or flexible sigmoidoscopy) you may notice spotting of blood in your stool or on the toilet paper. Some abdominal soreness may be present for a day or two, also.  DIET: Your first meal following the procedure should be a light meal and then it is ok to progress to your normal diet. A half-sandwich or bowl of soup is an example of a good first meal. Heavy or fried foods are harder to digest and may make you feel nauseous or bloated. Drink plenty of fluids but you should avoid alcoholic beverages for 24 hours. If you had a esophageal dilation, please see attached instructions for diet.    ACTIVITY: Your care partner should take you home directly after the procedure. You should plan to take it easy, moving slowly for the rest of the day. You can resume normal activity the day after the procedure however YOU SHOULD NOT DRIVE, use power tools, machinery or perform tasks that involve climbing or major physical exertion for 24 hours (because of the sedation medicines used during the test).   SYMPTOMS TO REPORT IMMEDIATELY: A gastroenterologist can be reached at any hour. Please call [phone]  for any of the following symptoms:  Following lower endoscopy (colonoscopy, flexible sigmoidoscopy) Excessive amounts of blood in the stool  Significant tenderness, worsening of abdominal pains  Swelling of the abdomen that is new, acute  Fever of 100  or higher  Following upper endoscopy (EGD, EUS, ERCP, esophageal dilation) Vomiting of blood or coffee ground material  New, significant abdominal pain  New, significant chest pain or pain under the shoulder blades  Painful or persistently difficult swallowing  New shortness of breath  Black, tarry-looking or red, bloody stools  FOLLOW UP:  If any biopsies were taken you will be contacted by phone or by letter within the next 1-3 weeks. Call [phone]  if you have not heard about the biopsies in 3 weeks.  Please also call with any specific questions about appointments or follow up tests. YOU HAD AN ENDOSCOPIC PROCEDURE TODAY: Refer to the procedure report and other information in the discharge instructions given to you for any specific questions about what was found during the examination. If this information does not answer your questions, please call [facility] office at [phone] to clarify. Call if GI question or problem otherwise start with soft solids today and slowly advance diet as tolerated and follow-up as needed from a GI standpoint

## 2023-05-27 NOTE — Anesthesia Postprocedure Evaluation (Signed)
Anesthesia Post Note  Patient: Mckenzie Lawrence  Procedure(s) Performed: ESOPHAGOGASTRODUODENOSCOPY (EGD) WITH PROPOFOL (Bilateral) BOTOX INJECTION (Bilateral)     Patient location during evaluation: PACU Anesthesia Type: MAC Level of consciousness: awake and alert Pain management: pain level controlled Vital Signs Assessment: post-procedure vital signs reviewed and stable Respiratory status: spontaneous breathing Cardiovascular status: stable Anesthetic complications: no Comments: I spoke to Dr. Ewing Schlein prior to procedure about her BP and need to re-evaluate post procedure. I was not consulted about BP prior to her D/C.    No notable events documented.  Last Vitals:  Vitals:   05/27/23 1010 05/27/23 1014  BP: (!) 203/60 (!) 203/62  Pulse: 66 66  Resp: 14 17  Temp:    SpO2: 100% 100%    Last Pain:  Vitals:   05/27/23 1014  TempSrc:   PainSc: 0-No pain                 Lewie Loron

## 2023-05-27 NOTE — Anesthesia Preprocedure Evaluation (Addendum)
Anesthesia Evaluation  Patient identified by MRN, date of birth, ID band Patient awake    Reviewed: Allergy & Precautions, NPO status , Patient's Chart, lab work & pertinent test results  History of Anesthesia Complications (+) PONV and history of anesthetic complications  Airway Mallampati: III  TM Distance: >3 FB Neck ROM: Full    Dental  (+) Implants, Caps, Teeth Intact, Dental Advisory Given   Pulmonary sleep apnea , COPD, Current Smoker   Pulmonary exam normal breath sounds clear to auscultation       Cardiovascular hypertension, Pt. on medications and Pt. on home beta blockers + CAD  Normal cardiovascular exam Rhythm:Regular Rate:Normal     Neuro/Psych  PSYCHIATRIC DISORDERS Anxiety      Neuromuscular disease CVA    GI/Hepatic Neg liver ROS,GERD  ,,  Endo/Other  diabetes    Renal/GU Renal disease     Musculoskeletal  (+) Arthritis ,    Abdominal   Peds  Hematology  (+) Blood dyscrasia, anemia   Anesthesia Other Findings   Reproductive/Obstetrics                              Anesthesia Physical Anesthesia Plan  ASA: 4  Anesthesia Plan: MAC   Post-op Pain Management: Minimal or no pain anticipated   Induction: Intravenous  PONV Risk Score and Plan: Propofol infusion, TIVA and Treatment may vary due to age or medical condition  Airway Management Planned:   Additional Equipment:   Intra-op Plan:   Post-operative Plan:   Informed Consent: I have reviewed the patients History and Physical, chart, labs and discussed the procedure including the risks, benefits and alternatives for the proposed anesthesia with the patient or authorized representative who has indicated his/her understanding and acceptance.     Dental advisory given  Plan Discussed with: CRNA  Anesthesia Plan Comments: (Risks of anesthesia explained at length. This includes, but is not limited to, sore  throat, damage to teeth, lips gums, tongue and vocal cords, nausea and vomiting, reactions to medications, stroke, heart attack, and death. All patient questions were answered and the patient wishes to proceed. )         Anesthesia Quick Evaluation

## 2023-05-27 NOTE — Progress Notes (Signed)
Mckenzie Lawrence 8:22 AM  Subjective: Patient doing well without any new medical problems except for some recent eye injection since she was seen recently in our office her swallowing has been getting worse and Botox injection helped in the past and she is off her Plavix and just taking an aspirin and has no other complaints and her case discussed with her significant other  Objective: Vital signs stable afebrile no acute distress exam please see preassessment evaluation  Assessment: Dysphagia probably achalasia  Plan: Okay to proceed with endoscopy and probable Botox with anesthesia assistance  [facility]  office [phone] After 5PM or if no answer call [phone]

## 2023-05-27 NOTE — Transfer of Care (Signed)
Immediate Anesthesia Transfer of Care Note  Patient: Mckenzie Lawrence  Procedure(s) Performed: ESOPHAGOGASTRODUODENOSCOPY (EGD) WITH PROPOFOL (Bilateral) BOTOX INJECTION (Bilateral)  Patient Location: PACU  Anesthesia Type:MAC  Level of Consciousness: awake, alert , and oriented  Airway & Oxygen Therapy: Patient Spontanous Breathing and Patient connected to face mask oxygen  Post-op Assessment: Report given to RN  Post vital signs: Reviewed and stable  Last Vitals:  Vitals Value Taken Time  BP 167/51 05/27/23 0953  Temp    Pulse 68 05/27/23 0955  Resp 21 05/27/23 0955  SpO2 100 % 05/27/23 0955  Vitals shown include unfiled device data.  Last Pain:  Vitals:   05/27/23 0758  TempSrc: Tympanic  PainSc: 0-No pain         Complications: No notable events documented.

## 2023-05-27 NOTE — Op Note (Signed)
[facility] Patient Name: Mckenzie Lawrence Procedure Date: 05/27/2023 MRN: [id number] Attending MD: Vida Rigger , MD, [id number] Date of Birth: 10-05-1945 CSN: [id number] Age: 77 Admit Type: Outpatient Procedure:                Upper GI endoscopy Indications:              Dysphagia, For therapy of achalasia Providers:                Vida Rigger, MD, Lorenza Evangelist, RN, Alan Ripper,                            Technician Referring MD:              Medicines:                Monitored Anesthesia Care Complications:            No immediate complications. Estimated Blood Loss:     Estimated blood loss: none. Procedure:                Pre-Anesthesia Assessment:                           - Prior to the procedure, a History and Physical                            was performed, and patient medications and                            allergies were reviewed. The patient's tolerance of                            previous anesthesia was also reviewed. The risks                            and benefits of the procedure and the sedation                            options and risks were discussed with the patient.                            All questions were answered, and informed consent                            was obtained. Prior Anticoagulants: The patient has                            taken no anticoagulant or antiplatelet agents                            except for aspirin. ASA Grade Assessment: III - A                            patient with severe systemic disease. After  reviewing the risks and benefits, the patient was                            deemed in satisfactory condition to undergo the                            procedure.                           After obtaining informed consent, the endoscope was                            passed under direct vision. Throughout the                            procedure, the patient's blood pressure, pulse,  and                            oxygen saturations were monitored continuously. The                            [id number]-H190 ([id number]) Olympus endoscope was introduced                            through the mouth, and advanced to the second part                            of duodenum. The upper GI endoscopy was                            accomplished without difficulty. The patient                            tolerated the procedure well. Scope In: Scope Out: Findings:      A small hiatal hernia was present.      No appreciable esophageal motility was noted. In addition, a hypertonic       lower esophageal sphincter was found. There was mild resistance to       endoscope advancement into the stomach. The Z-line was regular. The       gastroesophageal junction and cardia were normal on retroflexed view.       Area was successfully injected with 100 units botulinum toxin.      A small amount of food (residue) was found in the gastric fundus.      The duodenal bulb, first portion of the duodenum and second portion of       the duodenum were normal.      The exam was otherwise without abnormality. Impression:               - Small hiatal hernia.                           - The esophageal examination was consistent with                            achalasia. Injected with  botulinum toxin.                           - A small amount of food (residue) in the stomach.                           - Normal duodenal bulb, first portion of the                            duodenum and second portion of the duodenum.                           - The examination was otherwise normal.                           - No specimens collected. Moderate Sedation:      Not Applicable - Patient had care per Anesthesia. Recommendation:           - Patient has a contact number available for                            emergencies. The signs and symptoms of potential                            delayed complications were  discussed with the                            patient. Return to normal activities tomorrow.                            Written discharge instructions were provided to the                            patient.                           - Soft diet today.                           - Continue present medications.                           - Return to GI clinic PRN.                           - Telephone GI clinic if symptomatic PRN. Procedure Code(s):        --- Professional ---                           [phone], Esophagogastroduodenoscopy, flexible,                            transoral; with directed submucosal injection(s),                            any substance Diagnosis Code(s):        ---  Professional ---                           K44.9, Diaphragmatic hernia without obstruction or                            gangrene                           R13.10, Dysphagia, unspecified                           K22.0, Achalasia of cardia CPT copyright 2022 [employer]. All rights reserved. The codes documented in this report are preliminary and upon coder review may  be revised to meet current compliance requirements. Vida Rigger, MD 05/27/2023 9:53:34 AM This report has been signed electronically. Number of Addenda: 0

## 2023-05-28 ENCOUNTER — Encounter (HOSPITAL_COMMUNITY): Payer: Self-pay | Admitting: Gastroenterology

## 2023-05-29 DIAGNOSIS — G4733 Obstructive sleep apnea (adult) (pediatric): Secondary | ICD-10-CM | POA: Diagnosis not present

## 2023-05-29 DIAGNOSIS — R809 Proteinuria, unspecified: Secondary | ICD-10-CM | POA: Diagnosis not present

## 2023-05-29 DIAGNOSIS — D631 Anemia in chronic kidney disease: Secondary | ICD-10-CM | POA: Diagnosis not present

## 2023-05-29 DIAGNOSIS — N2581 Secondary hyperparathyroidism of renal origin: Secondary | ICD-10-CM | POA: Diagnosis not present

## 2023-05-29 DIAGNOSIS — M545 Low back pain, unspecified: Secondary | ICD-10-CM | POA: Diagnosis not present

## 2023-05-29 DIAGNOSIS — E1122 Type 2 diabetes mellitus with diabetic chronic kidney disease: Secondary | ICD-10-CM | POA: Diagnosis not present

## 2023-05-29 DIAGNOSIS — N184 Chronic kidney disease, stage 4 (severe): Secondary | ICD-10-CM | POA: Diagnosis not present

## 2023-05-29 DIAGNOSIS — I10 Essential (primary) hypertension: Secondary | ICD-10-CM | POA: Diagnosis not present

## 2023-06-02 ENCOUNTER — Encounter: Payer: Self-pay | Admitting: Podiatry

## 2023-06-02 ENCOUNTER — Ambulatory Visit (INDEPENDENT_AMBULATORY_CARE_PROVIDER_SITE_OTHER): Payer: PPO | Admitting: Podiatry

## 2023-06-02 DIAGNOSIS — D689 Coagulation defect, unspecified: Secondary | ICD-10-CM

## 2023-06-02 DIAGNOSIS — B351 Tinea unguium: Secondary | ICD-10-CM | POA: Diagnosis not present

## 2023-06-02 DIAGNOSIS — E785 Hyperlipidemia, unspecified: Secondary | ICD-10-CM | POA: Diagnosis not present

## 2023-06-02 DIAGNOSIS — M79676 Pain in unspecified toe(s): Secondary | ICD-10-CM | POA: Diagnosis not present

## 2023-06-02 DIAGNOSIS — J449 Chronic obstructive pulmonary disease, unspecified: Secondary | ICD-10-CM | POA: Diagnosis not present

## 2023-06-02 DIAGNOSIS — I10 Essential (primary) hypertension: Secondary | ICD-10-CM | POA: Diagnosis not present

## 2023-06-02 DIAGNOSIS — R600 Localized edema: Secondary | ICD-10-CM | POA: Diagnosis not present

## 2023-06-02 DIAGNOSIS — Z720 Tobacco use: Secondary | ICD-10-CM | POA: Diagnosis not present

## 2023-06-02 DIAGNOSIS — B078 Other viral warts: Secondary | ICD-10-CM | POA: Diagnosis not present

## 2023-06-02 DIAGNOSIS — R002 Palpitations: Secondary | ICD-10-CM | POA: Diagnosis not present

## 2023-06-02 DIAGNOSIS — N1832 Chronic kidney disease, stage 3b: Secondary | ICD-10-CM | POA: Diagnosis not present

## 2023-06-02 DIAGNOSIS — E1142 Type 2 diabetes mellitus with diabetic polyneuropathy: Secondary | ICD-10-CM

## 2023-06-02 DIAGNOSIS — R238 Other skin changes: Secondary | ICD-10-CM | POA: Diagnosis not present

## 2023-06-02 DIAGNOSIS — M545 Low back pain, unspecified: Secondary | ICD-10-CM | POA: Diagnosis not present

## 2023-06-02 DIAGNOSIS — E119 Type 2 diabetes mellitus without complications: Secondary | ICD-10-CM | POA: Diagnosis not present

## 2023-06-02 DIAGNOSIS — I2510 Atherosclerotic heart disease of native coronary artery without angina pectoris: Secondary | ICD-10-CM | POA: Diagnosis not present

## 2023-06-02 DIAGNOSIS — G4733 Obstructive sleep apnea (adult) (pediatric): Secondary | ICD-10-CM | POA: Diagnosis not present

## 2023-06-02 DIAGNOSIS — I6381 Other cerebral infarction due to occlusion or stenosis of small artery: Secondary | ICD-10-CM | POA: Diagnosis not present

## 2023-06-02 DIAGNOSIS — I639 Cerebral infarction, unspecified: Secondary | ICD-10-CM | POA: Diagnosis not present

## 2023-06-02 NOTE — Progress Notes (Signed)
She presents today chief complaint of painful elongated toenails.  Objective: Vital signs stable alert oriented x 3 pulses are palpable.  Toenails are long thick yellow dystrophic onychomycotic painful palpation.  Assessment: Pain in limb secondary to onychomycosis.  Plan: Debridement of toenails 1 through 5 bilateral.  Remember to thank her for the footage and to ask how her cruise over Christmas with.

## 2023-06-04 DIAGNOSIS — M545 Low back pain, unspecified: Secondary | ICD-10-CM | POA: Diagnosis not present

## 2023-06-16 DIAGNOSIS — M545 Low back pain, unspecified: Secondary | ICD-10-CM | POA: Diagnosis not present

## 2023-06-16 DIAGNOSIS — N3020 Other chronic cystitis without hematuria: Secondary | ICD-10-CM | POA: Diagnosis not present

## 2023-06-17 ENCOUNTER — Ambulatory Visit
Admission: RE | Admit: 2023-06-17 | Discharge: 2023-06-17 | Disposition: A | Discharge status: Home / Self Care | Payer: PPO | Source: Ambulatory Visit | Attending: Oncology | Admitting: Oncology

## 2023-06-17 DIAGNOSIS — R911 Solitary pulmonary nodule: Secondary | ICD-10-CM | POA: Diagnosis not present

## 2023-06-17 LAB — GLUCOSE, CAPILLARY
Glucose-Capillary: 133 mg/dL — ABNORMAL HIGH (ref 70–99)
Glucose-Capillary: 102 mg/dL — ABNORMAL HIGH (ref 70–99)

## 2023-06-17 MED ORDER — FLUDEOXYGLUCOSE F - 18 (FDG) INJECTION
8.2300 | Freq: Once | INTRAVENOUS | Status: AC | PRN
  Administered 2023-06-17: 8.23 via INTRAVENOUS

## 2023-06-23 DIAGNOSIS — M545 Low back pain, unspecified: Secondary | ICD-10-CM | POA: Diagnosis not present

## 2023-06-30 ENCOUNTER — Other Ambulatory Visit: Payer: Self-pay | Admitting: *Deleted

## 2023-06-30 DIAGNOSIS — R911 Solitary pulmonary nodule: Secondary | ICD-10-CM

## 2023-06-30 DIAGNOSIS — M545 Low back pain, unspecified: Secondary | ICD-10-CM | POA: Diagnosis not present

## 2023-07-07 DIAGNOSIS — M545 Low back pain, unspecified: Secondary | ICD-10-CM | POA: Diagnosis not present

## 2023-07-08 ENCOUNTER — Other Ambulatory Visit: Payer: PPO

## 2023-07-14 DIAGNOSIS — E1122 Type 2 diabetes mellitus with diabetic chronic kidney disease: Secondary | ICD-10-CM | POA: Diagnosis not present

## 2023-07-14 DIAGNOSIS — H353221 Exudative age-related macular degeneration, left eye, with active choroidal neovascularization: Secondary | ICD-10-CM | POA: Diagnosis not present

## 2023-07-14 DIAGNOSIS — I10 Essential (primary) hypertension: Secondary | ICD-10-CM | POA: Diagnosis not present

## 2023-07-14 DIAGNOSIS — N184 Chronic kidney disease, stage 4 (severe): Secondary | ICD-10-CM | POA: Diagnosis not present

## 2023-07-14 DIAGNOSIS — H353211 Exudative age-related macular degeneration, right eye, with active choroidal neovascularization: Secondary | ICD-10-CM | POA: Diagnosis not present

## 2023-07-15 ENCOUNTER — Encounter: Payer: Self-pay | Admitting: Oncology

## 2023-07-15 ENCOUNTER — Inpatient Hospital Stay: Payer: PPO | Attending: Oncology | Admitting: Oncology

## 2023-07-15 VITALS — BP 205/67 | HR 74 | Temp 96.5°F | Resp 16 | Ht 67.0 in | Wt 158.0 lb

## 2023-07-15 DIAGNOSIS — D649 Anemia, unspecified: Secondary | ICD-10-CM | POA: Insufficient documentation

## 2023-07-15 DIAGNOSIS — R911 Solitary pulmonary nodule: Secondary | ICD-10-CM | POA: Insufficient documentation

## 2023-07-15 DIAGNOSIS — F17210 Nicotine dependence, cigarettes, uncomplicated: Secondary | ICD-10-CM | POA: Insufficient documentation

## 2023-07-15 NOTE — Progress Notes (Signed)
Wants to know if she needs CT next week since she had a PET scan

## 2023-07-15 NOTE — Progress Notes (Signed)
[facility]  [phone] [phone]  ID: Mckenzie Lawrence OB: [birth year]  MR#: [id number]  PYK#:[id number]  Patient Care Team: [name], MD as PCP - General (Family Medicine) [name], MD as Consulting Physician (Oncology)  CHIEF COMPLAINT: Left upper lobe pulmonary nodule consistent with underlying malignancy.  INTERVAL HISTORY: Patient returns to clinic today for further evaluation and discussion of her PET scan results.  She continues to feel well and remains asymptomatic.  She has no neurologic complaints.  She denies any recent fevers or illnesses.  She has a good appetite and denies weight loss.  She has no chest pain, shortness of breath, cough, or hemoptysis.  She denies any nausea, vomiting, constipation, or diarrhea.  She has no urinary complaints.  Patient offers no specific complaints today.  REVIEW OF SYSTEMS:   Review of Systems  Constitutional: Negative.  Negative for fever, malaise/fatigue and weight loss.  Respiratory: Negative.  Negative for cough, hemoptysis and shortness of breath.   Cardiovascular: Negative.  Negative for chest pain and leg swelling.  Gastrointestinal: Negative.  Negative for abdominal pain.  Genitourinary: Negative.  Negative for dysuria.  Musculoskeletal: Negative.  Negative for back pain.  Skin: Negative.  Negative for rash.  Neurological: Negative.  Negative for dizziness, focal weakness, weakness and headaches.  Psychiatric/Behavioral: Negative.  The patient is not nervous/anxious.     As per HPI. Otherwise, a complete review of systems is negative.  PAST MEDICAL HISTORY: Past Medical History:  Diagnosis Date   Anemia    Arthritis    Bladder incontinence    Broken foot    Cataracts, bilateral    Chronic kidney insufficiency    stage 3b   COPD (chronic obstructive pulmonary disease) (HCC)    wheezing   Coronary artery disease 04/25/2022   in [place]   COVID 2021   very mild case    CVA (cerebral vascular accident) (HCC) 2016   has had 3 strokes, states right side is slightlyweaker than left   Diabetes mellitus    insulin dependent, Type 2   GERD (gastroesophageal reflux disease)    HLD (hyperlipidemia)    Hx of cardiovascular stress test    a. ETT (6/13):  Ex 5:13; no ischemic changes   Hypertension    controlled on meds   Lacunar stroke of left subthalamic region [facility]) 02/2015   Leg pain    left   Lower back pain    Neuromuscular disorder (HCC)    stroke right hand tingling   Orthostatic hypotension    Osteopenia 01/2017   T score -2.0 stable from prior DEXA   Pancreatitis 10/2021   PCOS (polycystic ovarian syndrome)    Personal history of tobacco use, presenting hazards to health 01/09/2015   PONV (postoperative nausea and vomiting)    Sleep apnea    uses CPAP nightly    PAST SURGICAL HISTORY: Past Surgical History:  Procedure Laterality Date   BIOPSY  08/28/2022   Procedure: BIOPSY;  Surgeon: Kathi Der, MD;  Location: [facility];  Service: Gastroenterology;;   BOTOX INJECTION  01/15/2022   Procedure: BOTOX INJECTION;  Surgeon: Vida Rigger, MD;  Location: [facility];  Service: Gastroenterology;;   BOTOX INJECTION N/A 08/28/2022   Procedure: BOTOX INJECTION;  Surgeon: Kathi Der, MD;  Location: [facility];  Service: Gastroenterology;  Laterality: N/A;   BOTOX INJECTION Bilateral 05/27/2023   Procedure: BOTOX INJECTION;  Surgeon: Vida Rigger, MD;  Location: [facility];  Service: Gastroenterology;  Laterality: Bilateral;  BROW LIFT Bilateral 11/04/2017   Procedure: BLEPHAROPLASTY UPPER EYELID W/EXCESS SKIN;  Surgeon: Imagene Riches, MD;  Location: [facility];  Service: Ophthalmology;  Laterality: Bilateral;  DIABETES-insulin dependent uses CPAP   CARDIAC CATHETERIZATION  20 yrs ago   found nothing   CARPAL TUNNEL RELEASE Bilateral    CATARACT EXTRACTION Bilateral    ELBOW SURGERY Bilateral    ESOPHAGOGASTRODUODENOSCOPY N/A  07/25/2021   Procedure: ESOPHAGOGASTRODUODENOSCOPY (EGD);  Surgeon: Toledo, Boykin Nearing, MD;  Location: [facility];  Service: Gastroenterology;  Laterality: N/A;  IDDM   ESOPHAGOGASTRODUODENOSCOPY N/A 01/15/2022   Procedure: ESOPHAGOGASTRODUODENOSCOPY (EGD);  Surgeon: Vida Rigger, MD;  Location: [facility];  Service: Gastroenterology;  Laterality: N/A;  botox   ESOPHAGOGASTRODUODENOSCOPY (EGD) WITH PROPOFOL N/A 08/28/2022   Procedure: ESOPHAGOGASTRODUODENOSCOPY (EGD) WITH PROPOFOL;  Surgeon: Kathi Der, MD;  Location: [facility];  Service: Gastroenterology;  Laterality: N/A;   ESOPHAGOGASTRODUODENOSCOPY (EGD) WITH PROPOFOL Bilateral 05/27/2023   Procedure: ESOPHAGOGASTRODUODENOSCOPY (EGD) WITH PROPOFOL;  Surgeon: Vida Rigger, MD;  Location: [facility];  Service: Gastroenterology;  Laterality: Bilateral;   FOOT SURGERY     Groin Abscess     HAND SURGERY     KNEE SURGERY Bilateral    LABIAL ABSCESS     LUMBAR LAMINECTOMY/DECOMPRESSION MICRODISCECTOMY Left 05/11/2018   Procedure: LUMBAR LAMINECTOMY/DECOMPRESSION MICRODISCECTOMY 1 LEVEL- L4-5;  Surgeon: Lucy Chris, MD;  Location: [facility];  Service: Neurosurgery;  Laterality: Left;   LUMBAR LAMINECTOMY/DECOMPRESSION MICRODISCECTOMY Left 05/20/2022   Procedure: MICRODISCECTOMY L3-4;  Surgeon: Tressie Stalker, MD;  Location: [facility] OR;  Service: Neurosurgery;  Laterality: Left;  3C   LUMBAR WOUND DEBRIDEMENT N/A 08/08/2022   Procedure: INCISION AND DRAINAGE OF LUMBAR WOUND;  Surgeon: Tressie Stalker, MD;  Location: [facility] OR;  Service: Neurosurgery;  Laterality: N/A;  3C   OOPHORECTOMY     BSO   PUBO VAG SLING     SHOULDER SURGERY     bilateral arthroscopies   VAGINAL HYSTERECTOMY  1979    FAMILY HISTORY: Family History  Problem Relation Age of Onset   Hypertension Mother    Heart disease Mother 50       MI   Diabetes Father    Diabetes Sister    Hypertension Sister    Diabetes Brother    Hypertension Brother    Heart disease  Brother 44       CAD   Cancer Sister        Multiple myloma   Diabetes Brother     ADVANCED DIRECTIVES (Y/N):  N  HEALTH MAINTENANCE: Social History   Tobacco Use   Smoking status: Every Day    Current packs/day: 1.50    Average packs/day: 1.5 packs/day for 50.0 years (75.0 ttl pk-yrs)    Types: Cigarettes   Smokeless tobacco: Never   Tobacco comments:    3ppd x 10 year, then cut back to 1.5pdd since 02/2015  Vaping Use   Vaping status: Never Used  Substance Use Topics   Alcohol use: Never    Alcohol/week: 0.0 standard drinks of alcohol   Drug use: No     Colonoscopy:  PAP:  Bone density:  Lipid panel:  Allergies  Allergen Reactions   Iodine Anaphylaxis and Other (See Comments)    Pt states that she is allergic to ingested iodine only, okay for betadine.     Shellfish Allergy Anaphylaxis   Codeine Nausea And Vomiting   Morphine Sulfate Nausea And Vomiting   Irbesartan Other (See Comments)     Unknown  (Avapro)  Sulfa Antibiotics Other (See Comments)    Fever     Current Outpatient Medications  Medication Sig Dispense Refill   acetaminophen (TYLENOL) 500 MG tablet Take 1,000 mg by mouth every 6 (six) hours as needed for moderate pain.     amLODipine (NORVASC) 10 MG tablet Take by mouth.     aspirin EC 81 MG tablet Take 81 mg by mouth at bedtime. Swallow whole.     BD INSULIN SYRINGE U/F 31G X 5/16" 1 ML MISC USE 1 SYRINGE AS DIRECTED     calcitRIOL (ROCALTROL) 0.25 MCG capsule Take 0.25 mcg by mouth at bedtime.     cholecalciferol (VITAMIN D3) 25 MCG (1000 UNIT) tablet Take 1,000 Units by mouth daily.     clopidogrel (PLAVIX) 75 MG tablet Take 75 mg by mouth at bedtime.     Continuous Glucose Sensor (FREESTYLE LIBRE 2 SENSOR) MISC      cyclobenzaprine (FLEXERIL) 10 MG tablet Take 1 tablet (10 mg total) by mouth 3 (three) times daily as needed for muscle spasms. 30 tablet 0   D-MANNOSE PO Take 2 tablets by mouth at bedtime.     diazepam (VALIUM) 10 MG tablet  Take 10 mg by mouth every 6 (six) hours as needed for anxiety.     diphenhydrAMINE (BENADRYL) 25 MG tablet Take 50 mg by mouth at bedtime as needed for sleep.     docusate sodium (COLACE) 100 MG capsule Take 1 capsule (100 mg total) by mouth 2 (two) times daily. (Patient taking differently: Take 100 mg by mouth at bedtime as needed for mild constipation or moderate constipation.) 30 capsule 0   estradiol (ESTRACE) 0.1 MG/GM vaginal cream Place 1 Applicatorful vaginally 2 (two) times a week.     feeding supplement (BOOST HIGH PROTEIN) LIQD Take 1 Container by mouth 2 (two) times daily between meals.     fluorouracil (EFUDEX) 5 % cream Apply 1 Application topically 2 (two) times daily.     FLUoxetine (PROZAC) 20 MG capsule Take 20 mg by mouth daily.     HYDROmorphone (DILAUDID) 2 MG tablet Take 1-2 tablets (2-4 mg total) by mouth every 4 (four) hours as needed for severe pain. 30 tablet 0   hydrOXYzine (ATARAX) 50 MG tablet Take 50 mg by mouth at bedtime.     insulin detemir (LEVEMIR) 100 UNIT/ML injection Inject 0.3-0.6 mLs (30-60 Units total) into the skin daily. (Patient taking differently: Inject 15 Units into the skin in the morning.) 10 mL 11   LANTUS 100 UNIT/ML injection Inject into the skin.     losartan (COZAAR) 100 MG tablet Take 1 tablet (100 mg total) by mouth daily. (Patient taking differently: Take 100 mg by mouth at bedtime.) 30 tablet 2   metoprolol succinate (TOPROL-XL) 100 MG 24 hr tablet Take 100 mg by mouth at bedtime.     Multiple Vitamins-Minerals (PRESERVISION AREDS 2+MULTI VIT PO) Take 1 capsule by mouth daily.     nicotine (NICODERM CQ - DOSED IN MG/24 HOURS) 21 mg/24hr patch Place 1 patch (21 mg total) onto the skin daily. 28 patch 0   nitrofurantoin, macrocrystal-monohydrate, (MACROBID) 100 MG capsule Take 100 mg by mouth at bedtime.     ondansetron (ZOFRAN) 4 MG tablet Take 1 tablet (4 mg total) by mouth every 6 (six) hours as needed for nausea or vomiting. 80 tablet 0    ONETOUCH ULTRA test strip 4 (four) times daily.     oxyCODONE (OXY IR/ROXICODONE) 5 MG immediate release tablet Take  1-2 tablets (5-10 mg total) by mouth every 4 (four) hours as needed for moderate pain ((score 4 to 6)). 30 tablet 0   pantoprazole (PROTONIX) 40 MG tablet Take 40 mg by mouth 2 (two) times daily.     PERCOCET 5-325 MG tablet take 1-2 tablet by oral route  every 6 hours as needed     phenazopyridine (PYRIDIUM) 200 MG tablet Take 200 mg by mouth 3 (three) times daily as needed.     rosuvastatin (CRESTOR) 5 MG tablet Take 5 mg by mouth daily.     sennosides-docusate sodium (SENOKOT-S) 8.6-50 MG tablet Take 1 tablet by mouth daily as needed for constipation.     SPIKEVAX syringe      sucralfate (CARAFATE) 1 g tablet Take 1 g by mouth 2 (two) times daily as needed (acid reflux).     venlafaxine XR (EFFEXOR-XR) 37.5 MG 24 hr capsule Take by mouth.     Vibegron (GEMTESA) 75 MG TABS Take 75 mg by mouth at bedtime.     zolpidem (AMBIEN) 10 MG tablet Take 10 mg by mouth at bedtime as needed for sleep.     gabapentin (NEURONTIN) 100 MG capsule Take 1 capsule (100 mg total) by mouth 3 (three) times daily. Start with 1 capsule at bedtime and advance to 3 capsules as tolerated. (Patient taking differently: Take 100 mg by mouth daily as needed (pain).) 90 capsule 0   No current facility-administered medications for this visit.    OBJECTIVE: Vitals:   07/15/23 1300  BP: (!) 205/67  Pulse: 74  Resp: 16  Temp: (!) 96.5 F (35.8 C)  SpO2: 100%     Body mass index is 24.75 kg/m.    ECOG FS:0 - Asymptomatic  General: Well-developed, well-nourished, no acute distress. Eyes: Pink conjunctiva, anicteric sclera. HEENT: Normocephalic, moist mucous membranes. Lungs: No audible wheezing or coughing. Heart: Regular rate and rhythm. Abdomen: Soft, nontender, no obvious distention. Musculoskeletal: No edema, cyanosis, or clubbing. Neuro: Alert, answering all questions appropriately. Cranial  nerves grossly intact. Skin: No rashes or petechiae noted. Psych: Normal affect.  LAB RESULTS:  Lab Results  Component Value Date   NA 136 05/27/2023   K 4.5 05/27/2023   CL 104 05/27/2023   CO2 22 08/13/2022   GLUCOSE 123 (H) 05/27/2023   BUN 42 (H) 05/27/2023   CREATININE 2.70 (H) 05/27/2023   CALCIUM 9.1 08/13/2022   PROT 6.2 (L) 08/08/2022   ALBUMIN 2.3 (L) 08/08/2022   AST 19 08/08/2022   ALT 11 08/08/2022   ALKPHOS 91 08/08/2022   BILITOT 0.5 08/08/2022   GFRNONAA 28 (L) 08/13/2022   GFRAA 43 (L) 05/06/2018    Lab Results  Component Value Date   WBC 11.1 (H) 08/08/2022   NEUTROABS 9.3 (H) 03/15/2015   HGB 10.2 (L) 05/27/2023   HCT 30.0 (L) 05/27/2023   MCV 91.8 08/08/2022   PLT 392 08/08/2022     STUDIES: NM PET Image Restag (PS) Skull Base To Thigh Result Date: 06/27/2023 CLINICAL DATA:  Initial treatment strategy for lung nodule and left adrenal mass. EXAM: NUCLEAR MEDICINE PET SKULL BASE TO THIGH TECHNIQUE: 8.2 mCi F-18 FDG was injected intravenously. Full-ring PET imaging was performed from the skull base to thigh after the radiotracer. CT data was obtained and used for attenuation correction and anatomic localization. Fasting blood glucose: 102 mg/dl COMPARISON:  81/19/1478 FINDINGS: Mediastinal blood-pool activity (background): SUV max = 2.8 Liver activity (reference): SUV max = N/A NECK: 9 mm hypermetabolic soft tissue  nodule again seen in the inferior left parotid gland on image 18, which has SUV max of 4.6. This is stable and suspicious for a primary parotid neoplasm. Hypermetabolic masses or lymphadenopathy Incidental CT findings:  None. CHEST: No hypermetabolic lymph nodes. 10 mm pulmonary nodule in the left lung apex on image 43/6 remains stable in size and morphology. This has SUV max of 2.2, which is also unchanged, and is not hypermetabolic. Incidental CT findings:  Mild centrilobular emphysema. ABDOMEN/PELVIS: No abnormal hypermetabolic activity within the  liver, pancreas, or spleen. Small focus of FDG uptake in the left adrenal gland has SUV max of 3.2, decreased from 3.6 previously. No associated adrenal mass seen on CT images. No hypermetabolic lymph nodes in the abdomen or pelvis. Incidental CT findings:  None. SKELETON: No focal hypermetabolic bone lesions to suggest skeletal metastasis. Incidental CT findings:  None. IMPRESSION: Stable 10 mm left apical pulmonary nodule, with low-grade FDG uptake. This remains indeterminate. Low-grade adenocarcinoma cannot be excluded. Consider continued follow-up by CT in 6 months or tissue sampling. No evidence of metastatic disease. Decreased FDG uptake in left adrenal gland, without associated mass. Recommend continued attention on follow-up CT. Stable 9 mm hypermetabolic nodule in inferior left parotid gland, suspicious for a primary parotid neoplasm. Emphysema (ICD10-J43.9). Electronically Signed   By: Danae Orleans M.D.   On: 06/27/2023 12:45    ASSESSMENT: Left upper lobe pulmonary nodule consistent with underlying malignancy  PLAN:    Left upper lobe pulmonary nodule consistent with underlying malignancy: No biopsy was performed.  Given the hypermetabolism on PET scan from February 02, 2023, patient elected to pursue SBRT.  She completed treatment on April 07, 2023.  PET scan results on June 17, 2023 reviewed independently with no obvious evidence of recurrent or progressive disease.  No further intervention is needed at this time.  Return to clinic in 6 months with repeat imaging using CT scan and further evaluation.   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  No further interventions are needed. Hypertension: Chronic and unchanged.  Patient blood pressure remains significantly elevated.  Continue monitoring and treatment per primary care. Renal insufficiency: Creatinine continues to trend up and is now 2.70.  Follow-up with nephrology as indicated.   Hyponatremia: Resolved. Anemia:  Hemoglobin trended down to 10.2.  Likely related to her ongoing renal insufficiency.  Patient expressed understanding and was in agreement with this plan. She also understands that She can call clinic at any time with any questions, concerns, or complaints.    Cancer Staging  No matching staging information was found for the patient.   [name], MD   07/15/2023 2:24 PM

## 2023-07-19 ENCOUNTER — Inpatient Hospital Stay
Admission: EM | Admit: 2023-07-19 | Discharge: 2023-07-26 | DRG: 871 | Disposition: A | Discharge status: Home Health | Payer: PPO | Attending: Internal Medicine | Admitting: Internal Medicine

## 2023-07-19 ENCOUNTER — Emergency Department: Payer: PPO

## 2023-07-19 DIAGNOSIS — R6520 Severe sepsis without septic shock: Secondary | ICD-10-CM | POA: Diagnosis present

## 2023-07-19 DIAGNOSIS — N3289 Other specified disorders of bladder: Secondary | ICD-10-CM | POA: Diagnosis not present

## 2023-07-19 DIAGNOSIS — R6510 Systemic inflammatory response syndrome (SIRS) of non-infectious origin without acute organ dysfunction: Secondary | ICD-10-CM

## 2023-07-19 DIAGNOSIS — E1169 Type 2 diabetes mellitus with other specified complication: Secondary | ICD-10-CM | POA: Diagnosis present

## 2023-07-19 DIAGNOSIS — E785 Hyperlipidemia, unspecified: Secondary | ICD-10-CM | POA: Diagnosis not present

## 2023-07-19 DIAGNOSIS — M5136 Other intervertebral disc degeneration, lumbar region with discogenic back pain only: Secondary | ICD-10-CM | POA: Diagnosis not present

## 2023-07-19 DIAGNOSIS — A419 Sepsis, unspecified organism: Secondary | ICD-10-CM | POA: Diagnosis not present

## 2023-07-19 DIAGNOSIS — Z85118 Personal history of other malignant neoplasm of bronchus and lung: Secondary | ICD-10-CM

## 2023-07-19 DIAGNOSIS — R059 Cough, unspecified: Secondary | ICD-10-CM | POA: Diagnosis not present

## 2023-07-19 DIAGNOSIS — J440 Chronic obstructive pulmonary disease with acute lower respiratory infection: Secondary | ICD-10-CM | POA: Diagnosis present

## 2023-07-19 DIAGNOSIS — R000 Tachycardia, unspecified: Secondary | ICD-10-CM | POA: Diagnosis not present

## 2023-07-19 DIAGNOSIS — R1314 Dysphagia, pharyngoesophageal phase: Secondary | ICD-10-CM | POA: Diagnosis present

## 2023-07-19 DIAGNOSIS — Z9109 Other allergy status, other than to drugs and biological substances: Secondary | ICD-10-CM

## 2023-07-19 DIAGNOSIS — A4102 Sepsis due to Methicillin resistant Staphylococcus aureus: Secondary | ICD-10-CM | POA: Diagnosis not present

## 2023-07-19 DIAGNOSIS — M4317 Spondylolisthesis, lumbosacral region: Secondary | ICD-10-CM | POA: Diagnosis not present

## 2023-07-19 DIAGNOSIS — B9562 Methicillin resistant Staphylococcus aureus infection as the cause of diseases classified elsewhere: Secondary | ICD-10-CM

## 2023-07-19 DIAGNOSIS — R7881 Bacteremia: Secondary | ICD-10-CM | POA: Diagnosis not present

## 2023-07-19 DIAGNOSIS — J449 Chronic obstructive pulmonary disease, unspecified: Secondary | ICD-10-CM | POA: Diagnosis present

## 2023-07-19 DIAGNOSIS — K219 Gastro-esophageal reflux disease without esophagitis: Secondary | ICD-10-CM | POA: Diagnosis present

## 2023-07-19 DIAGNOSIS — E222 Syndrome of inappropriate secretion of antidiuretic hormone: Secondary | ICD-10-CM | POA: Diagnosis present

## 2023-07-19 DIAGNOSIS — N179 Acute kidney failure, unspecified: Principal | ICD-10-CM | POA: Diagnosis present

## 2023-07-19 DIAGNOSIS — Z882 Allergy status to sulfonamides status: Secondary | ICD-10-CM

## 2023-07-19 DIAGNOSIS — Z8673 Personal history of transient ischemic attack (TIA), and cerebral infarction without residual deficits: Secondary | ICD-10-CM

## 2023-07-19 DIAGNOSIS — E872 Acidosis, unspecified: Secondary | ICD-10-CM | POA: Diagnosis present

## 2023-07-19 DIAGNOSIS — Z8249 Family history of ischemic heart disease and other diseases of the circulatory system: Secondary | ICD-10-CM

## 2023-07-19 DIAGNOSIS — M159 Polyosteoarthritis, unspecified: Secondary | ICD-10-CM | POA: Diagnosis present

## 2023-07-19 DIAGNOSIS — M47816 Spondylosis without myelopathy or radiculopathy, lumbar region: Secondary | ICD-10-CM | POA: Diagnosis not present

## 2023-07-19 DIAGNOSIS — Z91041 Radiographic dye allergy status: Secondary | ICD-10-CM

## 2023-07-19 DIAGNOSIS — D490 Neoplasm of unspecified behavior of digestive system: Secondary | ICD-10-CM | POA: Diagnosis present

## 2023-07-19 DIAGNOSIS — Z66 Do not resuscitate: Secondary | ICD-10-CM | POA: Diagnosis present

## 2023-07-19 DIAGNOSIS — G4733 Obstructive sleep apnea (adult) (pediatric): Secondary | ICD-10-CM | POA: Diagnosis present

## 2023-07-19 DIAGNOSIS — M545 Low back pain, unspecified: Secondary | ICD-10-CM | POA: Diagnosis not present

## 2023-07-19 DIAGNOSIS — Z91013 Allergy to seafood: Secondary | ICD-10-CM

## 2023-07-19 DIAGNOSIS — J441 Chronic obstructive pulmonary disease with (acute) exacerbation: Secondary | ICD-10-CM | POA: Diagnosis present

## 2023-07-19 DIAGNOSIS — J90 Pleural effusion, not elsewhere classified: Secondary | ICD-10-CM | POA: Diagnosis not present

## 2023-07-19 DIAGNOSIS — R590 Localized enlarged lymph nodes: Secondary | ICD-10-CM | POA: Diagnosis not present

## 2023-07-19 DIAGNOSIS — Z8616 Personal history of COVID-19: Secondary | ICD-10-CM

## 2023-07-19 DIAGNOSIS — J4489 Other specified chronic obstructive pulmonary disease: Secondary | ICD-10-CM | POA: Diagnosis not present

## 2023-07-19 DIAGNOSIS — M8580 Other specified disorders of bone density and structure, unspecified site: Secondary | ICD-10-CM | POA: Diagnosis present

## 2023-07-19 DIAGNOSIS — Z1629 Resistance to other single specified antibiotic: Secondary | ICD-10-CM | POA: Diagnosis present

## 2023-07-19 DIAGNOSIS — Z90710 Acquired absence of both cervix and uterus: Secondary | ICD-10-CM

## 2023-07-19 DIAGNOSIS — N184 Chronic kidney disease, stage 4 (severe): Secondary | ICD-10-CM | POA: Diagnosis present

## 2023-07-19 DIAGNOSIS — Z981 Arthrodesis status: Secondary | ICD-10-CM | POA: Diagnosis not present

## 2023-07-19 DIAGNOSIS — R918 Other nonspecific abnormal finding of lung field: Secondary | ICD-10-CM | POA: Diagnosis not present

## 2023-07-19 DIAGNOSIS — J439 Emphysema, unspecified: Secondary | ICD-10-CM | POA: Diagnosis present

## 2023-07-19 DIAGNOSIS — I160 Hypertensive urgency: Secondary | ICD-10-CM | POA: Diagnosis not present

## 2023-07-19 DIAGNOSIS — Z8614 Personal history of Methicillin resistant Staphylococcus aureus infection: Secondary | ICD-10-CM

## 2023-07-19 DIAGNOSIS — Z794 Long term (current) use of insulin: Secondary | ICD-10-CM

## 2023-07-19 DIAGNOSIS — I10 Essential (primary) hypertension: Secondary | ICD-10-CM | POA: Diagnosis not present

## 2023-07-19 DIAGNOSIS — Z1152 Encounter for screening for COVID-19: Secondary | ICD-10-CM

## 2023-07-19 DIAGNOSIS — J189 Pneumonia, unspecified organism: Secondary | ICD-10-CM | POA: Diagnosis not present

## 2023-07-19 DIAGNOSIS — Z79899 Other long term (current) drug therapy: Secondary | ICD-10-CM

## 2023-07-19 DIAGNOSIS — R1319 Other dysphagia: Secondary | ICD-10-CM | POA: Diagnosis not present

## 2023-07-19 DIAGNOSIS — I2510 Atherosclerotic heart disease of native coronary artery without angina pectoris: Secondary | ICD-10-CM | POA: Diagnosis present

## 2023-07-19 DIAGNOSIS — F17210 Nicotine dependence, cigarettes, uncomplicated: Secondary | ICD-10-CM | POA: Diagnosis not present

## 2023-07-19 DIAGNOSIS — N2581 Secondary hyperparathyroidism of renal origin: Secondary | ICD-10-CM | POA: Diagnosis not present

## 2023-07-19 DIAGNOSIS — R0902 Hypoxemia: Secondary | ICD-10-CM | POA: Diagnosis not present

## 2023-07-19 DIAGNOSIS — D631 Anemia in chronic kidney disease: Secondary | ICD-10-CM | POA: Diagnosis not present

## 2023-07-19 DIAGNOSIS — Z7902 Long term (current) use of antithrombotics/antiplatelets: Secondary | ICD-10-CM

## 2023-07-19 DIAGNOSIS — M4319 Spondylolisthesis, multiple sites in spine: Secondary | ICD-10-CM | POA: Diagnosis not present

## 2023-07-19 DIAGNOSIS — Z885 Allergy status to narcotic agent status: Secondary | ICD-10-CM

## 2023-07-19 DIAGNOSIS — Z833 Family history of diabetes mellitus: Secondary | ICD-10-CM

## 2023-07-19 DIAGNOSIS — Z888 Allergy status to other drugs, medicaments and biological substances status: Secondary | ICD-10-CM

## 2023-07-19 DIAGNOSIS — E1122 Type 2 diabetes mellitus with diabetic chronic kidney disease: Secondary | ICD-10-CM | POA: Diagnosis present

## 2023-07-19 DIAGNOSIS — J690 Pneumonitis due to inhalation of food and vomit: Secondary | ICD-10-CM | POA: Diagnosis present

## 2023-07-19 DIAGNOSIS — R509 Fever, unspecified: Secondary | ICD-10-CM | POA: Diagnosis not present

## 2023-07-19 DIAGNOSIS — I129 Hypertensive chronic kidney disease with stage 1 through stage 4 chronic kidney disease, or unspecified chronic kidney disease: Secondary | ICD-10-CM | POA: Diagnosis present

## 2023-07-19 DIAGNOSIS — Z923 Personal history of irradiation: Secondary | ICD-10-CM

## 2023-07-19 DIAGNOSIS — J168 Pneumonia due to other specified infectious organisms: Secondary | ICD-10-CM | POA: Diagnosis not present

## 2023-07-19 DIAGNOSIS — M48061 Spinal stenosis, lumbar region without neurogenic claudication: Secondary | ICD-10-CM | POA: Diagnosis not present

## 2023-07-19 DIAGNOSIS — K861 Other chronic pancreatitis: Secondary | ICD-10-CM | POA: Diagnosis present

## 2023-07-19 LAB — CBC WITH DIFFERENTIAL/PLATELET
Abs Immature Granulocytes: 0.1 10*3/uL — ABNORMAL HIGH (ref 0.00–0.07)
Basophils Absolute: 0 10*3/uL (ref 0.0–0.1)
Basophils Relative: 0 %
Eosinophils Absolute: 0 10*3/uL (ref 0.0–0.5)
Eosinophils Relative: 0 %
HCT: 27.2 % — ABNORMAL LOW (ref 36.0–46.0)
Hemoglobin: 9 g/dL — ABNORMAL LOW (ref 12.0–15.0)
Immature Granulocytes: 1 %
Lymphocytes Relative: 3 %
Lymphs Abs: 0.4 10*3/uL — ABNORMAL LOW (ref 0.7–4.0)
MCH: 31.8 pg (ref 26.0–34.0)
MCHC: 33.1 g/dL (ref 30.0–36.0)
MCV: 96.1 fL (ref 80.0–100.0)
Monocytes Absolute: 1 10*3/uL (ref 0.1–1.0)
Monocytes Relative: 8 %
Neutro Abs: 11.4 10*3/uL — ABNORMAL HIGH (ref 1.7–7.7)
Neutrophils Relative %: 88 %
Platelets: 207 10*3/uL (ref 150–400)
RBC: 2.83 MIL/uL — ABNORMAL LOW (ref 3.87–5.11)
RDW: 13.3 % (ref 11.5–15.5)
WBC: 12.9 10*3/uL — ABNORMAL HIGH (ref 4.0–10.5)
nRBC: 0 % (ref 0.0–0.2)

## 2023-07-19 LAB — COMPREHENSIVE METABOLIC PANEL
ALT: 14 U/L (ref 0–44)
AST: 24 U/L (ref 15–41)
Albumin: 2.8 g/dL — ABNORMAL LOW (ref 3.5–5.0)
Alkaline Phosphatase: 88 U/L (ref 38–126)
Anion gap: 17 — ABNORMAL HIGH (ref 5–15)
BUN: 56 mg/dL — ABNORMAL HIGH (ref 8–23)
CO2: 15 mmol/L — ABNORMAL LOW (ref 22–32)
Calcium: 8.8 mg/dL — ABNORMAL LOW (ref 8.9–10.3)
Chloride: 99 mmol/L (ref 98–111)
Creatinine, Ser: 3.2 mg/dL — ABNORMAL HIGH (ref 0.44–1.00)
GFR, Estimated: 15 mL/min — ABNORMAL LOW (ref >60)
Glucose, Bld: 180 mg/dL — ABNORMAL HIGH (ref 70–99)
Potassium: 4.4 mmol/L (ref 3.5–5.1)
Sodium: 131 mmol/L — ABNORMAL LOW (ref 135–145)
Total Bilirubin: 0.6 mg/dL (ref 0.0–1.2)
Total Protein: 6.6 g/dL (ref 6.5–8.1)

## 2023-07-19 LAB — RESP PANEL BY RT-PCR (RSV, FLU A&B, COVID)  RVPGX2
Influenza A by PCR: NEGATIVE
Influenza B by PCR: NEGATIVE
Resp Syncytial Virus by PCR: NEGATIVE
SARS Coronavirus 2 by RT PCR: NEGATIVE

## 2023-07-19 LAB — URINALYSIS, W/ REFLEX TO CULTURE (INFECTION SUSPECTED)
Bilirubin Urine: NEGATIVE
Glucose, UA: 50 mg/dL — AB
Hgb urine dipstick: NEGATIVE
Ketones, ur: NEGATIVE mg/dL
Leukocytes,Ua: NEGATIVE
Nitrite: NEGATIVE
Protein, ur: >=300 mg/dL — AB
Specific Gravity, Urine: 1.017 (ref 1.005–1.030)
pH: 5 (ref 5.0–8.0)

## 2023-07-19 LAB — APTT: aPTT: 35 s (ref 24–36)

## 2023-07-19 LAB — LACTIC ACID, PLASMA
Lactic Acid, Venous: 1.8 mmol/L (ref 0.5–1.9)
Lactic Acid, Venous: 2 mmol/L (ref 0.5–1.9)

## 2023-07-19 LAB — PROTIME-INR
INR: 1.4 — ABNORMAL HIGH (ref 0.8–1.2)
Prothrombin Time: 17.3 s — ABNORMAL HIGH (ref 11.4–15.2)

## 2023-07-19 MED ORDER — SODIUM CHLORIDE 0.9 % IV SOLN
500.0000 mg | INTRAVENOUS | Status: AC
  Administered 2023-07-20 – 2023-07-23 (×4): 500 mg via INTRAVENOUS
  Filled 2023-07-19 (×5): qty 5

## 2023-07-19 MED ORDER — CYCLOBENZAPRINE HCL 10 MG PO TABS: 10.0000 mg | ORAL_TABLET | Freq: Three times a day (TID) | ORAL | Status: DC | PRN

## 2023-07-19 MED ORDER — SODIUM CHLORIDE 0.9 % IV SOLN
500.0000 mg | Freq: Once | INTRAVENOUS | Status: AC
  Administered 2023-07-19: 500 mg via INTRAVENOUS
  Filled 2023-07-19: qty 5

## 2023-07-19 MED ORDER — VENLAFAXINE HCL ER 37.5 MG PO CP24
37.5000 mg | ORAL_CAPSULE | Freq: Every day | ORAL | Status: DC
  Administered 2023-07-20 – 2023-07-21 (×2): 37.5 mg via ORAL
  Filled 2023-07-19 (×2): qty 1

## 2023-07-19 MED ORDER — INSULIN GLARGINE-YFGN 100 UNIT/ML ~~LOC~~ SOLN
10.0000 [IU] | Freq: Every day | SUBCUTANEOUS | Status: DC
  Filled 2023-07-19: qty 0.1

## 2023-07-19 MED ORDER — HYDROXYZINE HCL 25 MG PO TABS
50.0000 mg | ORAL_TABLET | Freq: Every evening | ORAL | Status: DC | PRN
  Administered 2023-07-20: 50 mg via ORAL
  Filled 2023-07-19: qty 2

## 2023-07-19 MED ORDER — METOPROLOL SUCCINATE ER 100 MG PO TB24
100.0000 mg | ORAL_TABLET | Freq: Every day | ORAL | Status: DC
  Administered 2023-07-20 – 2023-07-25 (×6): 100 mg via ORAL
  Filled 2023-07-19 (×6): qty 1

## 2023-07-19 MED ORDER — ASPIRIN 81 MG PO TBEC
81.0000 mg | DELAYED_RELEASE_TABLET | Freq: Every day | ORAL | Status: DC
  Administered 2023-07-20 – 2023-07-25 (×6): 81 mg via ORAL
  Filled 2023-07-19 (×6): qty 1

## 2023-07-19 MED ORDER — ONDANSETRON HCL 4 MG/2ML IJ SOLN
4.0000 mg | Freq: Four times a day (QID) | INTRAMUSCULAR | Status: DC | PRN
  Administered 2023-07-23: 4 mg via INTRAVENOUS
  Filled 2023-07-19: qty 2

## 2023-07-19 MED ORDER — ACETAMINOPHEN 650 MG RE SUPP: 650.0000 mg | Freq: Four times a day (QID) | RECTAL | Status: DC | PRN

## 2023-07-19 MED ORDER — LOSARTAN POTASSIUM 50 MG PO TABS: 100.0000 mg | ORAL_TABLET | Freq: Every day | ORAL | Status: DC

## 2023-07-19 MED ORDER — SODIUM CHLORIDE 0.9 % IV SOLN
2.0000 g | INTRAVENOUS | Status: AC
  Administered 2023-07-20 – 2023-07-23 (×4): 2 g via INTRAVENOUS
  Filled 2023-07-19 (×5): qty 20

## 2023-07-19 MED ORDER — SODIUM CHLORIDE 0.9 % IV BOLUS
1000.0000 mL | Freq: Once | INTRAVENOUS | Status: AC
Start: 2023-07-19 — End: 2023-07-19
  Administered 2023-07-19: 1000 mL via INTRAVENOUS

## 2023-07-19 MED ORDER — AMLODIPINE BESYLATE 5 MG PO TABS: 5.0000 mg | ORAL_TABLET | Freq: Every day | ORAL | Status: DC

## 2023-07-19 MED ORDER — SUCRALFATE 1 G PO TABS: 1.0000 g | ORAL_TABLET | Freq: Two times a day (BID) | ORAL | Status: DC | PRN

## 2023-07-19 MED ORDER — GUAIFENESIN ER 600 MG PO TB12
600.0000 mg | ORAL_TABLET | Freq: Two times a day (BID) | ORAL | Status: DC
  Administered 2023-07-20 – 2023-07-25 (×11): 600 mg via ORAL
  Filled 2023-07-19 (×13): qty 1

## 2023-07-19 MED ORDER — ROSUVASTATIN CALCIUM 10 MG PO TABS
5.0000 mg | ORAL_TABLET | Freq: Every day | ORAL | Status: DC
  Administered 2023-07-20 – 2023-07-26 (×7): 5 mg via ORAL
  Filled 2023-07-19 (×7): qty 1

## 2023-07-19 MED ORDER — DOCUSATE SODIUM 100 MG PO CAPS: 100.0000 mg | ORAL_CAPSULE | Freq: Every evening | ORAL | Status: DC | PRN

## 2023-07-19 MED ORDER — OXYCODONE HCL 5 MG PO TABS
5.0000 mg | ORAL_TABLET | ORAL | Status: DC | PRN
  Administered 2023-07-23 (×2): 5 mg via ORAL
  Administered 2023-07-24 (×2): 10 mg via ORAL
  Filled 2023-07-19 (×2): qty 2
  Filled 2023-07-19 (×2): qty 1
  Filled 2023-07-19: qty 2

## 2023-07-19 MED ORDER — ALBUTEROL SULFATE (2.5 MG/3ML) 0.083% IN NEBU
2.5000 mg | INHALATION_SOLUTION | RESPIRATORY_TRACT | Status: DC | PRN
  Administered 2023-07-20 (×2): 2.5 mg via RESPIRATORY_TRACT
  Filled 2023-07-19 (×2): qty 3

## 2023-07-19 MED ORDER — HEPARIN SODIUM (PORCINE) 5000 UNIT/ML IJ SOLN
5000.0000 [IU] | Freq: Three times a day (TID) | INTRAMUSCULAR | Status: DC
  Administered 2023-07-20 – 2023-07-26 (×18): 5000 [IU] via SUBCUTANEOUS
  Filled 2023-07-19 (×17): qty 1

## 2023-07-19 MED ORDER — ACETAMINOPHEN 325 MG PO TABS
650.0000 mg | ORAL_TABLET | Freq: Four times a day (QID) | ORAL | Status: DC | PRN
  Administered 2023-07-20 – 2023-07-24 (×5): 650 mg via ORAL
  Filled 2023-07-19 (×5): qty 2

## 2023-07-19 MED ORDER — ENOXAPARIN SODIUM 40 MG/0.4ML IJ SOSY: 40.0000 mg | PREFILLED_SYRINGE | INTRAMUSCULAR | Status: DC

## 2023-07-19 MED ORDER — ACETAMINOPHEN 500 MG PO TABS
1000.0000 mg | ORAL_TABLET | Freq: Once | ORAL | Status: AC
  Administered 2023-07-19: 1000 mg via ORAL
  Filled 2023-07-19: qty 2

## 2023-07-19 MED ORDER — FLUOXETINE HCL 20 MG PO CAPS
20.0000 mg | ORAL_CAPSULE | Freq: Every day | ORAL | Status: DC
  Administered 2023-07-20: 20 mg via ORAL
  Filled 2023-07-19: qty 1

## 2023-07-19 MED ORDER — ONDANSETRON HCL 4 MG PO TABS: 4.0000 mg | ORAL_TABLET | Freq: Four times a day (QID) | ORAL | Status: DC | PRN

## 2023-07-19 MED ORDER — SODIUM CHLORIDE 0.9 % IV BOLUS
1000.0000 mL | Freq: Once | INTRAVENOUS | Status: AC
  Administered 2023-07-19: 1000 mL via INTRAVENOUS

## 2023-07-19 MED ORDER — CEFTRIAXONE SODIUM 2 G IJ SOLR
2.0000 g | Freq: Once | INTRAMUSCULAR | Status: AC
  Administered 2023-07-19: 2 g via INTRAVENOUS
  Filled 2023-07-19: qty 20

## 2023-07-19 NOTE — ED Notes (Signed)
Pt and daughter have decided they do not want to take the PO meds reordered from home tonight.

## 2023-07-19 NOTE — Assessment & Plan Note (Addendum)
SIRS, possible sepsis History of esophageal dysphagia and past history of aspiration SIRS criteria include low-grade temp, tachycardia, tachypnea, leukocytosis Chest x-ray concerning for x-ray aspiration Continue Rocephin and azithromycin Antitussives, incentive spirometer Supplemental oxygen to keep sats over 92% SLP eval

## 2023-07-19 NOTE — ED Notes (Signed)
Dr. Para March notified of lactic 2.0. Waiting for further instruction.

## 2023-07-19 NOTE — Assessment & Plan Note (Signed)
 CPAP nightly

## 2023-07-19 NOTE — H&P (Signed)
History and Physical    Patient: Mckenzie Lawrence ZOX:[id number] DOB: [birth year] DOA: 07/19/2023 DOS: the patient was seen and examined on 07/19/2023 PCP: [name], MD  Patient coming from: Home  Chief Complaint:  Chief Complaint  Patient presents with   Fever   Cough    HPI: Mckenzie Lawrence is a 78 y.o. female with medical history significant for COPD, CVA,CKD 4, SIADH, esophageal dysphagia treated with botox via EGD(last treatment 08/2022), type 2 diabetes mellitus, hyperlipidemia, hypertension being admitted for pneumonia.  Patient has a 3-day history of cough and shortness of breath and went to the urgent care where she was found to be febrile to 101.  Chest x-ray revealed pneumonia and she was discharged with amoxicillin and doxycycline.  On her arrival home however, she had an episode of shortness of breath and her daughter who is a nurse checked her vitals and found her to be hypoxic and tachycardic and called EMS to bring her to the hospital for evaluation. ED course and data review: On arrival temp 99.3, heart rate 117, tachypneic to 27 with O2 sat of 100% on room air.  BP elevated at 208/81. Workup notable for the following: Respiratory viral panel negative for COVID flu and RSV WBC 13,000 and lactic acid 1.80 Creatinine 3.20 up from baseline of 2.7 with anion gap of 17 and bicarb of 15, mild hyponatremia of 131 Hemoglobin 9 which is about her baseline Chest x-ray showing "Patchy airspace opacities and interstitial coarsening greatest in the right mid and lower lung concerning for infection or aspiration.  Emphysema".   Patient started on Rocephin and azithromycin and given a NS bolus Hospitalist consulted for admission.   Review of Systems: {ROS_Text:26778}  Past Medical History:  Diagnosis Date   Anemia    Arthritis    Bladder incontinence    Broken foot    Cataracts, bilateral    Chronic kidney insufficiency    stage 3b   COPD (chronic obstructive pulmonary  disease) (HCC)    wheezing   Coronary artery disease 04/25/2022   in [place]   COVID 2021   very mild case   CVA (cerebral vascular accident) (HCC) 2016   has had 3 strokes, states right side is slightlyweaker than left   Diabetes mellitus    insulin dependent, Type 2   GERD (gastroesophageal reflux disease)    HLD (hyperlipidemia)    Hx of cardiovascular stress test    a. ETT (6/13):  Ex 5:13; no ischemic changes   Hypertension    controlled on meds   Lacunar stroke of left subthalamic region [facility]) 02/2015   Leg pain    left   Lower back pain    Neuromuscular disorder (HCC)    stroke right hand tingling   Orthostatic hypotension    Osteopenia 01/2017   T score -2.0 stable from prior DEXA   Pancreatitis 10/2021   PCOS (polycystic ovarian syndrome)    Personal history of tobacco use, presenting hazards to health 01/09/2015   PONV (postoperative nausea and vomiting)    Sleep apnea    uses CPAP nightly   Past Surgical History:  Procedure Laterality Date   BIOPSY  08/28/2022   Procedure: BIOPSY;  Surgeon: Kathi Der, MD;  Location: [facility];  Service: Gastroenterology;;   BOTOX INJECTION  01/15/2022   Procedure: BOTOX INJECTION;  Surgeon: Vida Rigger, MD;  Location: [facility];  Service: Gastroenterology;;   BOTOX INJECTION N/A 08/28/2022   Procedure: BOTOX INJECTION;  Surgeon: Levora Angel,  Darcus Austin, MD;  Location: [facility];  Service: Gastroenterology;  Laterality: N/A;   BOTOX INJECTION Bilateral 05/27/2023   Procedure: BOTOX INJECTION;  Surgeon: Vida Rigger, MD;  Location: [facility];  Service: Gastroenterology;  Laterality: Bilateral;   BROW LIFT Bilateral 11/04/2017   Procedure: BLEPHAROPLASTY UPPER EYELID W/EXCESS SKIN;  Surgeon: Imagene Riches, MD;  Location: [facility];  Service: Ophthalmology;  Laterality: Bilateral;  DIABETES-insulin dependent uses CPAP   CARDIAC CATHETERIZATION  20 yrs ago   found nothing   CARPAL TUNNEL RELEASE Bilateral    CATARACT  EXTRACTION Bilateral    ELBOW SURGERY Bilateral    ESOPHAGOGASTRODUODENOSCOPY N/A 07/25/2021   Procedure: ESOPHAGOGASTRODUODENOSCOPY (EGD);  Surgeon: Toledo, Boykin Nearing, MD;  Location: [facility];  Service: Gastroenterology;  Laterality: N/A;  IDDM   ESOPHAGOGASTRODUODENOSCOPY N/A 01/15/2022   Procedure: ESOPHAGOGASTRODUODENOSCOPY (EGD);  Surgeon: Vida Rigger, MD;  Location: [facility];  Service: Gastroenterology;  Laterality: N/A;  botox   ESOPHAGOGASTRODUODENOSCOPY (EGD) WITH PROPOFOL N/A 08/28/2022   Procedure: ESOPHAGOGASTRODUODENOSCOPY (EGD) WITH PROPOFOL;  Surgeon: Kathi Der, MD;  Location: [facility];  Service: Gastroenterology;  Laterality: N/A;   ESOPHAGOGASTRODUODENOSCOPY (EGD) WITH PROPOFOL Bilateral 05/27/2023   Procedure: ESOPHAGOGASTRODUODENOSCOPY (EGD) WITH PROPOFOL;  Surgeon: Vida Rigger, MD;  Location: [facility];  Service: Gastroenterology;  Laterality: Bilateral;   FOOT SURGERY     Groin Abscess     HAND SURGERY     KNEE SURGERY Bilateral    LABIAL ABSCESS     LUMBAR LAMINECTOMY/DECOMPRESSION MICRODISCECTOMY Left 05/11/2018   Procedure: LUMBAR LAMINECTOMY/DECOMPRESSION MICRODISCECTOMY 1 LEVEL- L4-5;  Surgeon: Lucy Chris, MD;  Location: [facility];  Service: Neurosurgery;  Laterality: Left;   LUMBAR LAMINECTOMY/DECOMPRESSION MICRODISCECTOMY Left 05/20/2022   Procedure: MICRODISCECTOMY L3-4;  Surgeon: Tressie Stalker, MD;  Location: [facility];  Service: Neurosurgery;  Laterality: Left;  3C   LUMBAR WOUND DEBRIDEMENT N/A 08/08/2022   Procedure: INCISION AND DRAINAGE OF LUMBAR WOUND;  Surgeon: Tressie Stalker, MD;  Location: [facility] OR;  Service: Neurosurgery;  Laterality: N/A;  3C   OOPHORECTOMY     BSO   PUBO VAG SLING     SHOULDER SURGERY     bilateral arthroscopies   VAGINAL HYSTERECTOMY  1979   Social History:  reports that she has been smoking cigarettes. She has a 75 pack-year smoking history. She has never used smokeless tobacco. She reports that she does not  drink alcohol and does not use drugs.  Allergies  Allergen Reactions   Iodine Anaphylaxis and Other (See Comments)    Pt states that she is allergic to ingested iodine only, okay for betadine.     Shellfish Allergy Anaphylaxis   Codeine Nausea And Vomiting   Morphine Sulfate Nausea And Vomiting   Irbesartan Other (See Comments)     Unknown  (Avapro)   Sulfa Antibiotics Other (See Comments)    Fever     Family History  Problem Relation Age of Onset   Hypertension Mother    Heart disease Mother 76       MI   Diabetes Father    Diabetes Sister    Hypertension Sister    Diabetes Brother    Hypertension Brother    Heart disease Brother 60       CAD   Cancer Sister        Multiple myloma   Diabetes Brother     Prior to Admission medications   Medication Sig Start Date End Date Taking? Authorizing Provider  acetaminophen (TYLENOL) 500 MG tablet Take 1,000 mg  by mouth every 6 (six) hours as needed for moderate pain.    [provider]  amLODipine (NORVASC) 10 MG tablet Take by mouth.    [provider]  aspirin EC 81 MG tablet Take 81 mg by mouth at bedtime. Swallow whole.    [provider]  BD INSULIN SYRINGE U/F 31G X 5/16" 1 ML MISC USE 1 SYRINGE AS DIRECTED 07/10/20   [provider]  calcitRIOL (ROCALTROL) 0.25 MCG capsule Take 0.25 mcg by mouth at bedtime. 08/17/21   [provider]  cholecalciferol (VITAMIN D3) 25 MCG (1000 UNIT) tablet Take 1,000 Units by mouth daily.    [provider]  clopidogrel (PLAVIX) 75 MG tablet Take 75 mg by mouth at bedtime.    [provider]  Continuous Glucose Sensor (FREESTYLE LIBRE 2 SENSOR) MISC  05/07/23   [provider]  cyclobenzaprine (FLEXERIL) 10 MG tablet Take 1 tablet (10 mg total) by mouth 3 (three) times daily as needed for muscle spasms. 08/13/22   Val Eagle D, NP  D-MANNOSE PO Take 2 tablets by mouth at bedtime.    [provider]  diazepam  (VALIUM) 10 MG tablet Take 10 mg by mouth every 6 (six) hours as needed for anxiety.    [provider]  diphenhydrAMINE (BENADRYL) 25 MG tablet Take 50 mg by mouth at bedtime as needed for sleep.    [provider]  docusate sodium (COLACE) 100 MG capsule Take 1 capsule (100 mg total) by mouth 2 (two) times daily. Patient taking differently: Take 100 mg by mouth at bedtime as needed for mild constipation or moderate constipation. 05/21/22   Tressie Stalker, MD  estradiol (ESTRACE) 0.1 MG/GM vaginal cream Place 1 Applicatorful vaginally 2 (two) times a week. 11/23/21   [provider]  feeding supplement (BOOST HIGH PROTEIN) LIQD Take 1 Container by mouth 2 (two) times daily between meals.    [provider]  fluorouracil (EFUDEX) 5 % cream Apply 1 Application topically 2 (two) times daily. 03/24/23   [provider]  FLUoxetine (PROZAC) 20 MG capsule Take 20 mg by mouth daily. 01/01/23   [provider]  gabapentin (NEURONTIN) 100 MG capsule Take 1 capsule (100 mg total) by mouth 3 (three) times daily. Start with 1 capsule at bedtime and advance to 3 capsules as tolerated. Patient taking differently: Take 100 mg by mouth daily as needed (pain). 07/11/22 07/11/23  Val Eagle D, NP  HYDROmorphone (DILAUDID) 2 MG tablet Take 1-2 tablets (2-4 mg total) by mouth every 4 (four) hours as needed for severe pain. 08/13/22   Val Eagle D, NP  hydrOXYzine (ATARAX) 50 MG tablet Take 50 mg by mouth at bedtime.    [provider]  insulin detemir (LEVEMIR) 100 UNIT/ML injection Inject 0.3-0.6 mLs (30-60 Units total) into the skin daily. Patient taking differently: Inject 15 Units into the skin in the morning. 11/12/21   Alford Highland, MD  LANTUS 100 UNIT/ML injection Inject into the skin. 03/24/23 03/23/24  [provider]  losartan (COZAAR) 100 MG tablet Take 1 tablet (100 mg total) by mouth daily. Patient taking differently: Take 100 mg  by mouth at bedtime. 09/26/15   Enid Baas, MD  metoprolol succinate (TOPROL-XL) 100 MG 24 hr tablet Take 100 mg by mouth at bedtime.    [provider]  Multiple Vitamins-Minerals (PRESERVISION AREDS 2+MULTI VIT PO) Take 1 capsule by mouth daily.    [provider]  nicotine (NICODERM CQ -  DOSED IN MG/24 HOURS) 21 mg/24hr patch Place 1 patch (21 mg total) onto the skin daily. 08/14/22   Val Eagle D, NP  nitrofurantoin, macrocrystal-monohydrate, (MACROBID) 100 MG capsule Take 100 mg by mouth at bedtime.    [provider]  ondansetron (ZOFRAN) 4 MG tablet Take 1 tablet (4 mg total) by mouth every 6 (six) hours as needed for nausea or vomiting. 07/11/22   Val Eagle D, NP  [facility] ULTRA test strip 4 (four) times daily. 07/11/20   [provider]  oxyCODONE (OXY IR/ROXICODONE) 5 MG immediate release tablet Take 1-2 tablets (5-10 mg total) by mouth every 4 (four) hours as needed for moderate pain ((score 4 to 6)). 07/11/22   Val Eagle D, NP  pantoprazole (PROTONIX) 40 MG tablet Take 40 mg by mouth 2 (two) times daily. 07/06/21   [provider]  PERCOCET 5-325 MG tablet take 1-2 tablet by oral route  every 6 hours as needed 08/20/22   [provider]  phenazopyridine (PYRIDIUM) 200 MG tablet Take 200 mg by mouth 3 (three) times daily as needed. 08/16/22   [provider]  rosuvastatin (CRESTOR) 5 MG tablet Take 5 mg by mouth daily.    [provider]  sennosides-docusate sodium (SENOKOT-S) 8.6-50 MG tablet Take 1 tablet by mouth daily as needed for constipation.    [provider]  [facility] syringe  03/25/23   [provider]  sucralfate (CARAFATE) 1 g tablet Take 1 g by mouth 2 (two) times daily as needed (acid reflux).    [provider]  venlafaxine XR (EFFEXOR-XR) 37.5 MG 24 hr capsule Take by mouth. 12/16/22   [provider]  Vibegron (GEMTESA) 75 MG TABS Take 75 mg by mouth at  bedtime.    [provider]  zolpidem (AMBIEN) 10 MG tablet Take 10 mg by mouth at bedtime as needed for sleep. 12/20/21   [provider]    Physical Exam: Vitals:   07/19/23 2045 07/19/23 2051 07/19/23 2145  BP:  (!) 208/81 (!) 172/70  Pulse: (!) 117  (!) 117  Resp: (!) 27 20 19   Temp:  99.3 F (37.4 C)   SpO2: 100% 99% 98%   Physical Exam  Labs on Admission: I have personally reviewed following labs and imaging studies  CBC: Recent Labs  Lab 07/19/23 2040  WBC 12.9*  NEUTROABS 11.4*  HGB 9.0*  HCT 27.2*  MCV 96.1  PLT 207   Basic Metabolic Panel: Recent Labs  Lab 07/19/23 2040  NA 131*  K 4.4  CL 99  CO2 15*  GLUCOSE 180*  BUN 56*  CREATININE 3.20*  CALCIUM 8.8*   GFR: Estimated Creatinine Clearance: 14.8 mL/min (A) (by C-G formula based on SCr of 3.2 mg/dL (H)). Liver Function Tests: Recent Labs  Lab 07/19/23 2040  AST 24  ALT 14  ALKPHOS 88  BILITOT 0.6  PROT 6.6  ALBUMIN 2.8*   No results for input(s): "LIPASE", "AMYLASE" in the last 168 hours. No results for input(s): "AMMONIA" in the last 168 hours. Coagulation Profile: Recent Labs  Lab 07/19/23 2040  INR 1.4*   Cardiac Enzymes: No results for input(s): "CKTOTAL", "CKMB", "CKMBINDEX", "TROPONINI" in the last 168 hours. BNP (last 3 results) No results for input(s): "PROBNP" in the last 8760 hours. HbA1C: No results for input(s): "HGBA1C" in the last 72 hours. CBG: No results for input(s): "GLUCAP" in the last 168 hours. Lipid Profile: No results for input(s): "CHOL", "HDL", "LDLCALC", "TRIG", "CHOLHDL", "  LDLDIRECT" in the last 72 hours. Thyroid Function Tests: No results for input(s): "TSH", "T4TOTAL", "FREET4", "T3FREE", "THYROIDAB" in the last 72 hours. Anemia Panel: No results for input(s): "VITAMINB12", "FOLATE", "FERRITIN", "TIBC", "IRON", "RETICCTPCT" in the last 72 hours. Urine analysis:    Component Value Date/Time   COLORURINE AMBER (A) 11/09/2021 1937    APPEARANCEUR CLOUDY (A) 11/09/2021 1937   LABSPEC 1.013 11/09/2021 1937   PHURINE 5.0 11/09/2021 1937   GLUCOSEU NEGATIVE 11/09/2021 1937   HGBUR NEGATIVE 11/09/2021 1937   BILIRUBINUR NEGATIVE 11/09/2021 1937   KETONESUR NEGATIVE 11/09/2021 1937   PROTEINUR 100 (A) 11/09/2021 1937   UROBILINOGEN 0.2 10/12/2013 1551   NITRITE NEGATIVE 11/09/2021 1937   LEUKOCYTESUR MODERATE (A) 11/09/2021 1937    Radiological Exams on Admission: DG Chest Port 1 View Result Date: 07/19/2023 CLINICAL DATA:  Fever and cough, questionable sepsis EXAM: PORTABLE CHEST 1 VIEW COMPARISON:  PET/CT 06/17/2023 hand radiograph 05/06/2018 FINDINGS: Stable cardiomediastinal silhouette. Aortic atherosclerotic calcification. Hyperinflation and chronic bronchitic change. Interstitial coarsening and patchy airspace opacities greatest in the right mid and lower lung. No pleural effusion pneumothorax. IMPRESSION: Patchy airspace opacities and interstitial coarsening greatest in the right mid and lower lung concerning for infection or aspiration. Emphysema. Electronically Signed   By: Minerva Fester M.D.   On: 07/19/2023 21:10     Data Reviewed: Relevant notes from primary care and specialist visits, past discharge summaries as available in EHR, including Care Everywhere. Prior diagnostic testing as pertinent to current admission diagnoses Updated medications and problem lists for reconciliation ED course, including vitals, labs, imaging, treatment and response to treatment Triage notes, nursing and pharmacy notes and ED provider's notes Notable results as noted in HPI   Assessment and Plan: * CAP (community acquired pneumonia) SIRS, possible sepsis History of esophageal dysphagia and past history of aspiration SIRS criteria include low-grade temp, tachycardia, tachypnea, leukocytosis Chest x-ray concerning for x-ray aspiration Continue Rocephin and azithromycin Antitussives, incentive spirometer Supplemental oxygen  to keep sats over 92% SLP eval  Esophageal dysphagia Treated with Botox via EGD, last treatment 08/2022 Aspiration precautions Will get SLP eval given possible aspiration pneumonia  Acute renal failure superimposed on stage 4 chronic kidney disease (HCC) Creatinine 3.2 Avoid nephrotoxins, renally dose meds  CAD (coronary artery disease) No complaints of chest pain Continue rosuvastatin, metoprolol, losartan and aspirin  Hypertensive urgency SBP over 200 on arrival Continue home metoprolol losartan and amlodipine Hydralazine IV as needed for additional control  OSA (obstructive sleep apnea) CPAP nightly  SIADH (syndrome of inappropriate ADH production) (HCC) Mild hyponatremia of 131 with known SIADH  COPD (chronic obstructive pulmonary disease) (HCC) Not acutely exacerbated DuoNebs as needed  Type 2 diabetes mellitus with hyperlipidemia (HCC) Continue basal insulin Sliding scale coverage    DVT prophylaxis: Lovenox  Consults: none  Advance Care Planning:   Code Status: Prior   Family Communication: none  Disposition Plan: Back to previous home environment  Severity of Illness: The appropriate patient status for this patient is OBSERVATION. Observation status is judged to be reasonable and necessary in order to provide the required intensity of service to ensure the patient's safety. The patient's presenting symptoms, physical exam findings, and initial radiographic and laboratory data in the context of their medical condition is felt to place them at decreased risk for further clinical deterioration. Furthermore, it is anticipated that the patient will be medically stable for discharge from the hospital within 2 midnights of admission.   Author: Andris Baumann, MD 07/19/2023 10:25  PM  For on call review [email or web address].

## 2023-07-19 NOTE — ED Triage Notes (Signed)
Pt BIB EMS from home with c/o fever and cough. Pt was at medical center earlier today and was diagnosed with pneumonia.

## 2023-07-19 NOTE — Assessment & Plan Note (Signed)
Mild hyponatremia of 131 with known SIADH

## 2023-07-19 NOTE — Assessment & Plan Note (Signed)
Treated with Botox via EGD, last treatment 08/2022 Aspiration precautions Will get SLP eval given possible aspiration pneumonia

## 2023-07-19 NOTE — Assessment & Plan Note (Signed)
No complaints of chest pain Continue rosuvastatin, metoprolol, losartan and aspirin

## 2023-07-19 NOTE — ED Provider Notes (Signed)
[facility] Provider Note    Event Date/Time   First MD Initiated Contact with Patient 07/19/23 2015     (approximate)   History   No chief complaint on file.   HPI  Mckenzie Lawrence is a 78 y.o. female  CKD, DM II, HTN with productive cough for 3 days.  Patient was seen in urgent care today and diagnosed with pneumonia.  She had a reported positive temperature max of 104.  At the urgent care her temperature was 101.1 and pulse rate was 90.  There was infiltrates noted bilaterally greater on the right.  They sent her home with amoxicillin, doxycycline.  The daughter noted that she is a Engineer, civil (consulting) and noted that the heart rates were elevated therefore called EMS to have her further evaluated. Denies hitting head recently or any other concerns. NO abdominal pain.   Daughter is at bedside who does report that she has a history of a nodule on her lung that she did get radiation for but otherwise denies any history of COPD.  Does smoke  Physical Exam   Triage Vital Signs: ED Triage Vitals  Encounter Vitals Group     BP      Systolic BP Percentile      Diastolic BP Percentile      Pulse      Resp      Temp      Temp src      SpO2      Weight      Height      Head Circumference      Peak Flow      Pain Score      Pain Loc      Pain Education      Exclude from Growth Chart     Most recent vital signs: Vitals:   07/19/23 2045 07/19/23 2051  BP:  (!) 208/81  Pulse: (!) 117   Resp: (!) 27 20  Temp:  99.3 F (37.4 C)  SpO2: 100% 99%     General: Awake, no distress.  CV:  Good peripheral perfusion.  Resp:  Normal effort.  Abd:  No distention. Soft and non tender  Other:  No swelling in legs and no calf tenderness    ED Results / Procedures / Treatments   Labs (all labs ordered are listed, but only abnormal results are displayed) Labs Reviewed  RESP PANEL BY RT-PCR (RSV, FLU A&B, COVID)  [id number]  CULTURE, BLOOD (ROUTINE X 2)  CULTURE, BLOOD  (ROUTINE X 2)  LACTIC ACID, PLASMA  LACTIC ACID, PLASMA  COMPREHENSIVE METABOLIC PANEL  CBC WITH DIFFERENTIAL/PLATELET  PROTIME-INR  APTT  URINALYSIS, W/ REFLEX TO CULTURE (INFECTION SUSPECTED)     EKG  My interpretation of EKG:  Sinus tachycardia rate 118 without any ST elevation, no T wave inversions, normal intervals  RADIOLOGY I have reviewed the xray personally and interpreted and positive patchiness bilaterally  PROCEDURES:  Critical Care performed: Yes, see critical care procedure note(s)  .Critical Care  Performed by: Concha Se, MD Authorized by: Concha Se, MD   Critical care provider statement:    Critical care time (minutes):  30   Critical care was necessary to treat or prevent imminent or life-threatening deterioration of the following conditions:  Sepsis   Critical care was time spent personally by me on the following activities:  Development of treatment plan with patient or surrogate, discussions with consultants, evaluation of patient's response to treatment,  examination of patient, ordering and review of laboratory studies, ordering and review of radiographic studies, ordering and performing treatments and interventions, pulse oximetry, re-evaluation of patient's condition and review of old charts .1-3 Lead EKG Interpretation  Performed by: Concha Se, MD Authorized by: Concha Se, MD     Interpretation: abnormal     ECG rate assessment: tachycardic     Rhythm: sinus tachycardia     Ectopy: none     Conduction: normal      MEDICATIONS ORDERED IN ED: Medications  cefTRIAXone (ROCEPHIN) 2 g in sodium chloride 0.9 % 100 mL IVPB (has no administration in time range)  azithromycin (ZITHROMAX) 500 mg in sodium chloride 0.9 % 250 mL IVPB (has no administration in time range)  sodium chloride 0.9 % bolus 1,000 mL (has no administration in time range)     IMPRESSION / MDM / ASSESSMENT AND PLAN / ED COURSE  I reviewed the triage vital signs  and the nursing notes.   Patient's presentation is most consistent with acute presentation with potential threat to life or bodily function.   Patient appears septic with documented temperature at urgent care, tachycardia secondary to pneumonia.  Will get blood work to evaluate for bacteremia, urine evaluate for UTI.  Will repeat COVID, flu testing.  Sepsis alert was called patient started on 1 L of fluids ceftriaxone azithromycin for presumed pneumonia.  Considered PE but seems less likely given cough.  Patient's labs are notable for worsening CKD.  Lactate is normal.  CBC shows elevated white count.  Discussed with patient's daughter and will admit to the hospital for concern for fevers, dehydration, pneumonia, sepsis.  Discussed with family the concern for aspiration they deny any current concerns for aspiration.  She has had issues with that years ago but is gotten Botox injections and she no longer has issues with aspiration.  I suspect this is more likely just a pneumonia.     The patient is on the cardiac monitor to evaluate for evidence of arrhythmia and/or significant heart rate changes.      FINAL CLINICAL IMPRESSION(S) / ED DIAGNOSES   Final diagnoses:  AKI (acute kidney injury) (HCC)  Sepsis, due to unspecified organism, unspecified whether acute organ dysfunction present (HCC)  Pneumonia due to infectious organism, unspecified laterality, unspecified part of lung     Rx / DC Orders   ED Discharge Orders     None        Note:  This document was prepared using Dragon voice recognition software and may include unintentional dictation errors.   Concha Se, MD 07/19/23 2223

## 2023-07-19 NOTE — Assessment & Plan Note (Signed)
Not acutely exacerbated DuoNebs as needed

## 2023-07-19 NOTE — Hospital Course (Signed)
 Mckenzie Lawrence

## 2023-07-19 NOTE — Assessment & Plan Note (Signed)
 Continue basal insulin Sliding scale coverage

## 2023-07-19 NOTE — Sepsis Progress Note (Signed)
 Following for sepsis monitoring ?

## 2023-07-19 NOTE — Assessment & Plan Note (Signed)
Creatinine 3.2 Avoid nephrotoxins, renally dose meds

## 2023-07-19 NOTE — Progress Notes (Signed)
CODE SEPSIS - PHARMACY COMMUNICATION  **Broad Spectrum Antibiotics should be administered within 1 hour of Sepsis diagnosis**  Time Code Sepsis Called/Page Received: 2023  Antibiotics Ordered: azithromycin, ceftriaxine  Time of 1st antibiotic administration: 2051  Additional action taken by pharmacy: N/A  If necessary, Name of Provider/Nurse Contacted: N/A    Merryl Hacker ,PharmD Clinical Pharmacist  07/19/2023  8:37 PM

## 2023-07-19 NOTE — Assessment & Plan Note (Signed)
SBP over 200 on arrival Continue home metoprolol losartan and amlodipine Hydralazine IV as needed for additional control

## 2023-07-20 ENCOUNTER — Other Ambulatory Visit: Payer: Self-pay

## 2023-07-20 DIAGNOSIS — E785 Hyperlipidemia, unspecified: Secondary | ICD-10-CM | POA: Diagnosis not present

## 2023-07-20 DIAGNOSIS — R6520 Severe sepsis without septic shock: Secondary | ICD-10-CM | POA: Diagnosis not present

## 2023-07-20 DIAGNOSIS — E222 Syndrome of inappropriate secretion of antidiuretic hormone: Secondary | ICD-10-CM | POA: Diagnosis not present

## 2023-07-20 DIAGNOSIS — J440 Chronic obstructive pulmonary disease with acute lower respiratory infection: Secondary | ICD-10-CM | POA: Diagnosis not present

## 2023-07-20 DIAGNOSIS — E1169 Type 2 diabetes mellitus with other specified complication: Secondary | ICD-10-CM | POA: Diagnosis not present

## 2023-07-20 DIAGNOSIS — G4733 Obstructive sleep apnea (adult) (pediatric): Secondary | ICD-10-CM | POA: Diagnosis not present

## 2023-07-20 DIAGNOSIS — I2510 Atherosclerotic heart disease of native coronary artery without angina pectoris: Secondary | ICD-10-CM | POA: Diagnosis not present

## 2023-07-20 DIAGNOSIS — N184 Chronic kidney disease, stage 4 (severe): Secondary | ICD-10-CM | POA: Diagnosis not present

## 2023-07-20 DIAGNOSIS — A4102 Sepsis due to Methicillin resistant Staphylococcus aureus: Secondary | ICD-10-CM | POA: Diagnosis not present

## 2023-07-20 DIAGNOSIS — I129 Hypertensive chronic kidney disease with stage 1 through stage 4 chronic kidney disease, or unspecified chronic kidney disease: Secondary | ICD-10-CM | POA: Diagnosis not present

## 2023-07-20 DIAGNOSIS — J690 Pneumonitis due to inhalation of food and vomit: Secondary | ICD-10-CM | POA: Diagnosis not present

## 2023-07-20 DIAGNOSIS — Z66 Do not resuscitate: Secondary | ICD-10-CM | POA: Diagnosis not present

## 2023-07-20 DIAGNOSIS — Z1152 Encounter for screening for COVID-19: Secondary | ICD-10-CM | POA: Diagnosis not present

## 2023-07-20 DIAGNOSIS — R059 Cough, unspecified: Secondary | ICD-10-CM | POA: Diagnosis not present

## 2023-07-20 DIAGNOSIS — Z8616 Personal history of COVID-19: Secondary | ICD-10-CM | POA: Diagnosis not present

## 2023-07-20 DIAGNOSIS — J439 Emphysema, unspecified: Secondary | ICD-10-CM | POA: Diagnosis not present

## 2023-07-20 DIAGNOSIS — I160 Hypertensive urgency: Secondary | ICD-10-CM | POA: Diagnosis not present

## 2023-07-20 DIAGNOSIS — R7881 Bacteremia: Secondary | ICD-10-CM | POA: Diagnosis not present

## 2023-07-20 DIAGNOSIS — J168 Pneumonia due to other specified infectious organisms: Secondary | ICD-10-CM | POA: Diagnosis not present

## 2023-07-20 DIAGNOSIS — F17210 Nicotine dependence, cigarettes, uncomplicated: Secondary | ICD-10-CM | POA: Diagnosis not present

## 2023-07-20 DIAGNOSIS — D631 Anemia in chronic kidney disease: Secondary | ICD-10-CM | POA: Diagnosis not present

## 2023-07-20 DIAGNOSIS — J189 Pneumonia, unspecified organism: Secondary | ICD-10-CM | POA: Diagnosis not present

## 2023-07-20 DIAGNOSIS — R000 Tachycardia, unspecified: Secondary | ICD-10-CM | POA: Diagnosis not present

## 2023-07-20 DIAGNOSIS — A419 Sepsis, unspecified organism: Secondary | ICD-10-CM | POA: Diagnosis not present

## 2023-07-20 DIAGNOSIS — K861 Other chronic pancreatitis: Secondary | ICD-10-CM | POA: Diagnosis not present

## 2023-07-20 DIAGNOSIS — E1122 Type 2 diabetes mellitus with diabetic chronic kidney disease: Secondary | ICD-10-CM | POA: Diagnosis not present

## 2023-07-20 DIAGNOSIS — R1319 Other dysphagia: Secondary | ICD-10-CM | POA: Diagnosis not present

## 2023-07-20 DIAGNOSIS — R509 Fever, unspecified: Secondary | ICD-10-CM | POA: Diagnosis not present

## 2023-07-20 DIAGNOSIS — J4489 Other specified chronic obstructive pulmonary disease: Secondary | ICD-10-CM | POA: Diagnosis not present

## 2023-07-20 DIAGNOSIS — B9562 Methicillin resistant Staphylococcus aureus infection as the cause of diseases classified elsewhere: Secondary | ICD-10-CM | POA: Diagnosis not present

## 2023-07-20 DIAGNOSIS — Z794 Long term (current) use of insulin: Secondary | ICD-10-CM | POA: Diagnosis not present

## 2023-07-20 DIAGNOSIS — D490 Neoplasm of unspecified behavior of digestive system: Secondary | ICD-10-CM | POA: Diagnosis not present

## 2023-07-20 DIAGNOSIS — R918 Other nonspecific abnormal finding of lung field: Secondary | ICD-10-CM | POA: Diagnosis not present

## 2023-07-20 DIAGNOSIS — N179 Acute kidney failure, unspecified: Secondary | ICD-10-CM | POA: Diagnosis not present

## 2023-07-20 DIAGNOSIS — E872 Acidosis, unspecified: Secondary | ICD-10-CM | POA: Diagnosis not present

## 2023-07-20 DIAGNOSIS — Z1629 Resistance to other single specified antibiotic: Secondary | ICD-10-CM | POA: Diagnosis not present

## 2023-07-20 LAB — CBC WITH DIFFERENTIAL/PLATELET
Abs Immature Granulocytes: 0.05 10*3/uL (ref 0.00–0.07)
Basophils Absolute: 0 10*3/uL (ref 0.0–0.1)
Basophils Relative: 0 %
Eosinophils Absolute: 0 10*3/uL (ref 0.0–0.5)
Eosinophils Relative: 0 %
HCT: 28.6 % — ABNORMAL LOW (ref 36.0–46.0)
Hemoglobin: 9.3 g/dL — ABNORMAL LOW (ref 12.0–15.0)
Immature Granulocytes: 1 %
Lymphocytes Relative: 10 %
Lymphs Abs: 0.8 10*3/uL (ref 0.7–4.0)
MCH: 31.8 pg (ref 26.0–34.0)
MCHC: 32.5 g/dL (ref 30.0–36.0)
MCV: 97.9 fL (ref 80.0–100.0)
Monocytes Absolute: 0.8 10*3/uL (ref 0.1–1.0)
Monocytes Relative: 10 %
Neutro Abs: 6.1 10*3/uL (ref 1.7–7.7)
Neutrophils Relative %: 79 %
Platelets: 195 10*3/uL (ref 150–400)
RBC: 2.92 MIL/uL — ABNORMAL LOW (ref 3.87–5.11)
RDW: 13.4 % (ref 11.5–15.5)
WBC: 7.7 10*3/uL (ref 4.0–10.5)
nRBC: 0 % (ref 0.0–0.2)

## 2023-07-20 LAB — BASIC METABOLIC PANEL
Anion gap: 11 (ref 5–15)
BUN: 54 mg/dL — ABNORMAL HIGH (ref 8–23)
CO2: 16 mmol/L — ABNORMAL LOW (ref 22–32)
Calcium: 8.8 mg/dL — ABNORMAL LOW (ref 8.9–10.3)
Chloride: 107 mmol/L (ref 98–111)
Creatinine, Ser: 3.01 mg/dL — ABNORMAL HIGH (ref 0.44–1.00)
GFR, Estimated: 16 mL/min — ABNORMAL LOW (ref >60)
Glucose, Bld: 143 mg/dL — ABNORMAL HIGH (ref 70–99)
Potassium: 4.3 mmol/L (ref 3.5–5.1)
Sodium: 134 mmol/L — ABNORMAL LOW (ref 135–145)

## 2023-07-20 LAB — CBG MONITORING, ED
Glucose-Capillary: 132 mg/dL — ABNORMAL HIGH (ref 70–99)
Glucose-Capillary: 170 mg/dL — ABNORMAL HIGH (ref 70–99)

## 2023-07-20 LAB — LACTIC ACID, PLASMA: Lactic Acid, Venous: 1.3 mmol/L (ref 0.5–1.9)

## 2023-07-20 LAB — CORTISOL-AM, BLOOD: Cortisol - AM: 25.5 ug/dL — ABNORMAL HIGH (ref 6.7–22.6)

## 2023-07-20 LAB — PROCALCITONIN: Procalcitonin: 3.82 ng/mL

## 2023-07-20 LAB — HEMOGLOBIN A1C
Hgb A1c MFr Bld: 6.1 % — ABNORMAL HIGH (ref 4.8–5.6)
Mean Plasma Glucose: 128.37 mg/dL

## 2023-07-20 LAB — GLUCOSE, CAPILLARY: Glucose-Capillary: 160 mg/dL — ABNORMAL HIGH (ref 70–99)

## 2023-07-20 MED ORDER — INSULIN ASPART 100 UNIT/ML IJ SOLN
0.0000 [IU] | Freq: Every day | INTRAMUSCULAR | Status: DC
  Administered 2023-07-25: 2 [IU] via SUBCUTANEOUS
  Filled 2023-07-20: qty 1

## 2023-07-20 MED ORDER — HYDRALAZINE HCL 20 MG/ML IJ SOLN
5.0000 mg | INTRAMUSCULAR | Status: DC | PRN
  Administered 2023-07-21 – 2023-07-23 (×2): 5 mg via INTRAVENOUS
  Filled 2023-07-20 (×3): qty 1

## 2023-07-20 MED ORDER — AMLODIPINE BESYLATE 5 MG PO TABS
5.0000 mg | ORAL_TABLET | Freq: Every day | ORAL | Status: DC
  Administered 2023-07-20 – 2023-07-23 (×4): 5 mg via ORAL
  Filled 2023-07-20 (×4): qty 1

## 2023-07-20 MED ORDER — INSULIN ASPART 100 UNIT/ML IJ SOLN
0.0000 [IU] | Freq: Three times a day (TID) | INTRAMUSCULAR | Status: DC
  Administered 2023-07-20: 3 [IU] via SUBCUTANEOUS
  Administered 2023-07-21: 8 [IU] via SUBCUTANEOUS
  Administered 2023-07-21: 3 [IU] via SUBCUTANEOUS
  Administered 2023-07-22: 5 [IU] via SUBCUTANEOUS
  Administered 2023-07-22 (×2): 3 [IU] via SUBCUTANEOUS
  Administered 2023-07-23: 5 [IU] via SUBCUTANEOUS
  Administered 2023-07-23: 3 [IU] via SUBCUTANEOUS
  Administered 2023-07-23 – 2023-07-25 (×3): 2 [IU] via SUBCUTANEOUS
  Filled 2023-07-20 (×13): qty 1

## 2023-07-20 NOTE — Progress Notes (Addendum)
Progress Note   Patient: Mckenzie Lawrence ZOX:[id number] DOB: 1946-01-05 DOA: 07/19/2023     0 DOS: the patient was seen and examined on 07/20/2023   Brief hospital course: 77yo with h/o COPD, CVA, stage 4 CKD, SIADH, esophageal dysphagia treated with Botox (last in 05/2023), DM, HLD, and HTN who presented on 2/1 with cough, SOB.  Outpatient CXR with PNA, treated x 1 dose with Amoxil and Doxy but worsened and so came in.  SIRS criteria on presentation with lactate to 2.0, concerning for sepsis.  Creatinine 3.2, up from baseline 2.7.  CXR with multifocal infiltrates concerning for infection vs. Aspiration.   Treated with Ceftriaxone/Azithromycin.  No dysphagia symptoms at home.  Assessment and Plan:  Sepsis due to CAP (community acquired pneumonia) vs. Aspiration PNA  SIRS criteria include low-grade temp, tachycardia, tachypnea, leukocytosis with elevated lactate suggestive of sepsis Chest x-ray concerning for x-ray aspiration vs. CAP Continue Rocephin and azithromycin Antitussives, incentive spirometer Wheezing this AM, add neb treatments Supplemental oxygen to keep sats over 92% Lactate has normalized Elevated procalcitonin   Esophageal dysphagia Treated with Botox via EGD, last treatment 05/2023 Aspiration precautions Will get SLP eval given possible aspiration pneumonia Given lack of obvious symptoms, will start CLD SLP was able to confirm that CLD is appropriate but patient and daughter refused further evaluation at this time; will reconsult SLP prior to advancing diet She will need outpatient f/u with Dr. Harland Dingwall due to concern for persistent/silent aspiration   Acute renal failure superimposed on stage 4 chronic kidney disease  Creatinine 3.2, baseline 2.6 Avoid nephrotoxins, renally dose meds Hold losartan IVF Recheck BMP this AM shows improvement   CAD (coronary artery disease) No complaints of chest pain Continue rosuvastatin, metoprolol, losartan and aspirin   Hypertensive  urgency SBP over 200 on arrival Continue home metoprolol and amlodipine Hold losartan due to renal condition Hydralazine IV as needed for additional control   OSA (obstructive sleep apnea) CPAP nightly   SIADH (syndrome of inappropriate ADH production)  Mild hyponatremia of 131 with known SIADH   COPD (chronic obstructive pulmonary disease)  Not acutely exacerbated DuoNebs as needed   Type 2 diabetes mellitus with hyperlipidemia  Not eating currently, will hold glargine Sliding scale coverage  DNR Discussed with patient/daughter and she wants to be DNR Code status changed accordingly     Consultants: SLP  Procedures: None  Antibiotics: Ceftriaxone 2/1- Azithromycin 2/1-      Subjective: She still feels terrible.  +productive cough with whitish sputum.  No further fevers.  No further hypoxia (daughter reports sats in the 60s at home with skin a bluish tint).  No dysphagia, wants to eat.     Physical Exam: Vitals:   07/20/23 0445 07/20/23 1000 07/20/23 1028 07/20/23 1029  BP:   (!) 134/93   Pulse:  96 (!) 101 100  Resp:      Temp: 98.5 F (36.9 C)  98.8 F (37.1 C)   TempSrc: Oral  Oral   SpO2:  100% 100% 100%    Exam:  General:  Appears ill but in NAD, on RA with O2 sats >95% throughout Eyes:  EOMI, normal lids, iris ENT:  grossly normal hearing, lips & tongue, mmm Neck:  no LAD, masses or thyromegaly Cardiovascular:  RRR, no m/r/g. No LE edema.  Respiratory:   Diffuse wheezing with moderate air movement.  Increased respiratory effort. Abdomen:  soft, NT, ND Skin:  no rash or induration seen on limited exam Musculoskeletal:  grossly normal  tone BUE/BLE, good ROM, no bony abnormality Psychiatric:  blunted mood and affect, speech fluent and appropriate, AOx3 Neurologic:  CN 2-12 grossly intact, moves all extremities in coordinated fashion  Data Reviewed: I have reviewed the patient's lab results since admission.  Pertinent labs for today  include:  Na++ 131 -> 134 CO2 15 -> 16 Glucose 180, 143, 132 BUN 56 -> 54/Creatinine 3.2 -> 3.01/GFR 15 -> 16; 42/2.7 on 12/10 Lactate 1.8 -> 2.0 -> 1.3 Procalcitonin 3.82 WBC 12.9 -> 7.7 Hgb 9.3, stable A1c 6.1 COVID/flu/RSV negative UA: 50 glucose, >300 protein    Family Communication: Daughter was present throughout evaluation  Disposition: Status is: Inpatient Admit - It is my clinical opinion that admission to INPATIENT is reasonable and necessary because of the expectation that this patient will require hospital care that crosses at least 2 midnights to treat this condition based on the medical complexity of the problems presented.  Given the aforementioned information, the predictability of an adverse outcome is felt to be significant.     Planned Discharge Destination: Home    Time spent: 50 minutes  Author: Jonah Blue, MD 07/20/2023 12:10 PM  For on call review [email or web address].

## 2023-07-20 NOTE — Evaluation (Signed)
Clinical/Bedside Swallow Evaluation Patient Details  Name: Mckenzie Lawrence MRN: [id number] Date of Birth: September 29, 1945  Today's Date: 07/20/2023 Time: SLP Start Time (ACUTE ONLY): 1120 SLP Stop Time (ACUTE ONLY): 1138 SLP Time Calculation (min) (ACUTE ONLY): 18 min  Past Medical History:  Past Medical History:  Diagnosis Date   Anemia    Arthritis    Bladder incontinence    Broken foot    Cataracts, bilateral    Chronic kidney insufficiency    stage 3b   COPD (chronic obstructive pulmonary disease) (HCC)    wheezing   Coronary artery disease 04/25/2022   in [place]   COVID 2021   very mild case   CVA (cerebral vascular accident) (HCC) 2016   has had 3 strokes, states right side is slightlyweaker than left   Diabetes mellitus    insulin dependent, Type 2   GERD (gastroesophageal reflux disease)    HLD (hyperlipidemia)    Hx of cardiovascular stress test    a. ETT (6/13):  Ex 5:13; no ischemic changes   Hypertension    controlled on meds   Lacunar stroke of left subthalamic region [facility]) 02/2015   Leg pain    left   Lower back pain    Neuromuscular disorder (HCC)    stroke right hand tingling   Orthostatic hypotension    Osteopenia 01/2017   T score -2.0 stable from prior DEXA   Pancreatitis 10/2021   PCOS (polycystic ovarian syndrome)    Personal history of tobacco use, presenting hazards to health 01/09/2015   PONV (postoperative nausea and vomiting)    Sleep apnea    uses CPAP nightly   Past Surgical History:  Past Surgical History:  Procedure Laterality Date   BIOPSY  08/28/2022   Procedure: BIOPSY;  Surgeon: Kathi Der, MD;  Location: [facility];  Service: Gastroenterology;;   BOTOX INJECTION  01/15/2022   Procedure: BOTOX INJECTION;  Surgeon: Vida Rigger, MD;  Location: [facility];  Service: Gastroenterology;;   BOTOX INJECTION N/A 08/28/2022   Procedure: BOTOX INJECTION;  Surgeon: Kathi Der, MD;  Location: [facility];  Service: Gastroenterology;   Laterality: N/A;   BOTOX INJECTION Bilateral 05/27/2023   Procedure: BOTOX INJECTION;  Surgeon: Vida Rigger, MD;  Location: [facility];  Service: Gastroenterology;  Laterality: Bilateral;   BROW LIFT Bilateral 11/04/2017   Procedure: BLEPHAROPLASTY UPPER EYELID W/EXCESS SKIN;  Surgeon: Imagene Riches, MD;  Location: [facility];  Service: Ophthalmology;  Laterality: Bilateral;  DIABETES-insulin dependent uses CPAP   CARDIAC CATHETERIZATION  20 yrs ago   found nothing   CARPAL TUNNEL RELEASE Bilateral    CATARACT EXTRACTION Bilateral    ELBOW SURGERY Bilateral    ESOPHAGOGASTRODUODENOSCOPY N/A 07/25/2021   Procedure: ESOPHAGOGASTRODUODENOSCOPY (EGD);  Surgeon: Toledo, Boykin Nearing, MD;  Location: [facility];  Service: Gastroenterology;  Laterality: N/A;  IDDM   ESOPHAGOGASTRODUODENOSCOPY N/A 01/15/2022   Procedure: ESOPHAGOGASTRODUODENOSCOPY (EGD);  Surgeon: Vida Rigger, MD;  Location: [facility];  Service: Gastroenterology;  Laterality: N/A;  botox   ESOPHAGOGASTRODUODENOSCOPY (EGD) WITH PROPOFOL N/A 08/28/2022   Procedure: ESOPHAGOGASTRODUODENOSCOPY (EGD) WITH PROPOFOL;  Surgeon: Kathi Der, MD;  Location: [facility];  Service: Gastroenterology;  Laterality: N/A;   ESOPHAGOGASTRODUODENOSCOPY (EGD) WITH PROPOFOL Bilateral 05/27/2023   Procedure: ESOPHAGOGASTRODUODENOSCOPY (EGD) WITH PROPOFOL;  Surgeon: Vida Rigger, MD;  Location: [facility];  Service: Gastroenterology;  Laterality: Bilateral;   FOOT SURGERY     Groin Abscess     HAND SURGERY     KNEE SURGERY Bilateral  LABIAL ABSCESS     LUMBAR LAMINECTOMY/DECOMPRESSION MICRODISCECTOMY Left 05/11/2018   Procedure: LUMBAR LAMINECTOMY/DECOMPRESSION MICRODISCECTOMY 1 LEVEL- L4-5;  Surgeon: Lucy Chris, MD;  Location: [facility];  Service: Neurosurgery;  Laterality: Left;   LUMBAR LAMINECTOMY/DECOMPRESSION MICRODISCECTOMY Left 05/20/2022   Procedure: MICRODISCECTOMY L3-4;  Surgeon: Tressie Stalker, MD;  Location: [facility] OR;   Service: Neurosurgery;  Laterality: Left;  3C   LUMBAR WOUND DEBRIDEMENT N/A 08/08/2022   Procedure: INCISION AND DRAINAGE OF LUMBAR WOUND;  Surgeon: Tressie Stalker, MD;  Location: [facility] OR;  Service: Neurosurgery;  Laterality: N/A;  3C   OOPHORECTOMY     BSO   PUBO VAG SLING     SHOULDER SURGERY     bilateral arthroscopies   VAGINAL HYSTERECTOMY  1979   HPI:  Mckenzie Lawrence is a 78 y.o. female with medical history significant for COPD, CVA,CKD 4, SIADH, esophageal dysphagia treated with botox via EGD(last treatment 08/2022), type 2 diabetes mellitus, hyperlipidemia, hypertension being admitted for pneumonia.  Patient has a 3-day history of cough and shortness of breath and went to the urgent care where she was found to be febrile to 101.  Chest x-ray revealed pneumonia and she was discharged with amoxicillin and doxycycline.  On her arrival home however, she had an episode of shortness of breath and her daughter who is a nurse checked her vitals and found her to be hypoxic and tachycardic and called EMS to bring her to the hospital for evaluation (07/19/2023). Chest x-ray showing "Patchy airspace opacities and interstitial coarsening greatest in the right mid and lower lung concerning for infection or aspiration.  Emphysema".   Per chart, pt with most recent EGD and treatment on 05/27/2023 "A small hiatal hernia was present.  No appreciable esophageal motility was noted. In addition, a hypertonic lower esophageal sphincter was found. There was mild resistance to endoscope advancement into the stomach. Area was successfully injected with 100 units bolutinum toxin. The esophageal examination was consistent with achalasia.  A small amount of food (residue) was found in the bastric fundus."   Assessment / Plan / Recommendation  Clinical Impression  Pt endorses not feeling well with her daughter present, pt's husband follows afterwards. Daughter reports "she (pt) is frustrated because everyone keeps asking  about swallowing problems but she doesn't have any swallowing problems." This Clinical research associate provided educated the multiple phases of swallowing (dysphagia) included pharyngeal and esophageal. All of which can place patients at a higher risk of aspiration.   Prior to this admission, they both report that pt was consuming "whatever she wanted to eat, frequently going out to eat" post most recent EGD with botox injections (05/27/2023).   When sitting upright for consumption of thin liquids via straw, pt voiced increased back pain with bed tilted to help compensate.   Pt consumed thin liquids via straw as well as Tylenol (whole) one at a time without any overt s/s of pharyngeal dysphagia or aspiration. She was observed gagging and burping consistently with consumption. Pt refused further consumption of solids d/t decreased apettite related to current illness.   Education provided on ST services and improved pt outcome with direct observation of pt consuming solids with ST prior to upgrade given the above history. Pt and her family are in agreement with pt remaining on current diet (clear liquids) and will re-consult ST services should pt request solids once she is feeling better. Pt stated "I am fine, I follow with [facility]."   Education provided on reflux and aspiration precautions including having  head of bed elevated for at least 30 minutes post PO consumption.  ST to sign off at this time.   SLP Visit Diagnosis: Dysphagia, unspecified (R13.10)    Aspiration Risk  Mild aspiration risk    Diet Recommendation  (clear liquid diet per pt and family request)    Medication Administration: Whole meds with liquid Supervision: Intermittent supervision to cue for compensatory strategies;Patient able to self feed Compensations: Minimize environmental distractions;Slow rate;Small sips/bites Postural Changes: Seated upright at 90 degrees;Remain upright for at least 30 minutes after po intake    Other   Recommendations Recommended Consults: Consider esophageal assessment;Consider GI evaluation Oral Care Recommendations: Oral care BID    Recommendations for follow up therapy are one component of a multi-disciplinary discharge planning process, led by the attending physician.  Recommendations may be updated based on patient status, additional functional criteria and insurance authorization.  Follow up Recommendations No SLP follow up      Assistance Recommended at Discharge  N/A  Functional Status Assessment Patient has not had a recent decline in their functional status  Frequency and Duration   N/A         Prognosis   N/A     Swallow Study   General Date of Onset: 07/19/23 HPI: Mckenzie Lawrence is a 78 y.o. female with medical history significant for COPD, CVA,CKD 4, SIADH, esophageal dysphagia treated with botox via EGD(last treatment 08/2022), type 2 diabetes mellitus, hyperlipidemia, hypertension being admitted for pneumonia.  Patient has a 3-day history of cough and shortness of breath and went to the urgent care where she was found to be febrile to 101.  Chest x-ray revealed pneumonia and she was discharged with amoxicillin and doxycycline.  On her arrival home however, she had an episode of shortness of breath and her daughter who is a nurse checked her vitals and found her to be hypoxic and tachycardic and called EMS to bring her to the hospital for evaluation (07/19/2023). Chest x-ray showing "Patchy airspace opacities and interstitial coarsening greatest in the right mid and lower lung concerning for infection or aspiration.  Emphysema". 05/27/2023   A small hiatal hernia was present.  No appreciable esophageal motility was noted. In addition, a hypertonic lower esophageal sphincter was found. There was mild resistance to endoscope advancement into the stomach. Area was successfully injected with 100 units bolutinum toxin. The esophageal examination was consistent with achalasia.  A  small amount of food (residue) was found in the bastric fundus. Type of Study: Bedside Swallow Evaluation Previous Swallow Assessment: none in chart Diet Prior to this Study: Clear liquid diet Temperature Spikes Noted: Yes Respiratory Status: Room air History of Recent Intubation: No Behavior/Cognition: Alert Oral Cavity Assessment: Within Functional Limits Oral Care Completed by SLP: No Oral Cavity - Dentition: Adequate natural dentition Vision: Functional for self-feeding Self-Feeding Abilities: Needs assist Patient Positioning: Upright in bed Baseline Vocal Quality: Normal Volitional Cough: Strong Volitional Swallow: Able to elicit    Oral/Motor/Sensory Function Overall Oral Motor/Sensory Function: Within functional limits   Ice Chips     Thin Liquid Thin Liquid: Within functional limits Presentation: Straw;Self Fed    Nectar Thick Nectar Thick Liquid: Not tested   Honey Thick Honey Thick Liquid: Not tested   Puree Puree: Not tested   Solid     Solid: Not tested     Cabella Kimm B. Dreama Saa, M.S., CCC-SLP, Tree surgeon Certified Brain Injury Specialist [facility]  [facility] Rehabilitation Services Office [phone] Ascom [phone]  Fax [phone]

## 2023-07-21 ENCOUNTER — Ambulatory Visit: Payer: PPO

## 2023-07-21 DIAGNOSIS — J189 Pneumonia, unspecified organism: Secondary | ICD-10-CM | POA: Diagnosis not present

## 2023-07-21 LAB — CBC WITH DIFFERENTIAL/PLATELET
Abs Immature Granulocytes: 0.06 10*3/uL (ref 0.00–0.07)
Basophils Absolute: 0 10*3/uL (ref 0.0–0.1)
Basophils Relative: 0 %
Eosinophils Absolute: 0 10*3/uL (ref 0.0–0.5)
Eosinophils Relative: 0 %
HCT: 25.8 % — ABNORMAL LOW (ref 36.0–46.0)
Hemoglobin: 8.4 g/dL — ABNORMAL LOW (ref 12.0–15.0)
Immature Granulocytes: 1 %
Lymphocytes Relative: 12 %
Lymphs Abs: 0.9 10*3/uL (ref 0.7–4.0)
MCH: 31.8 pg (ref 26.0–34.0)
MCHC: 32.6 g/dL (ref 30.0–36.0)
MCV: 97.7 fL (ref 80.0–100.0)
Monocytes Absolute: 0.8 10*3/uL (ref 0.1–1.0)
Monocytes Relative: 10 %
Neutro Abs: 5.9 10*3/uL (ref 1.7–7.7)
Neutrophils Relative %: 77 %
Platelets: 193 10*3/uL (ref 150–400)
RBC: 2.64 MIL/uL — ABNORMAL LOW (ref 3.87–5.11)
RDW: 13.6 % (ref 11.5–15.5)
WBC: 7.7 10*3/uL (ref 4.0–10.5)
nRBC: 0 % (ref 0.0–0.2)

## 2023-07-21 LAB — BASIC METABOLIC PANEL
Anion gap: 9 (ref 5–15)
BUN: 59 mg/dL — ABNORMAL HIGH (ref 8–23)
CO2: 19 mmol/L — ABNORMAL LOW (ref 22–32)
Calcium: 8.7 mg/dL — ABNORMAL LOW (ref 8.9–10.3)
Chloride: 107 mmol/L (ref 98–111)
Creatinine, Ser: 3.15 mg/dL — ABNORMAL HIGH (ref 0.44–1.00)
GFR, Estimated: 15 mL/min — ABNORMAL LOW (ref >60)
Glucose, Bld: 171 mg/dL — ABNORMAL HIGH (ref 70–99)
Potassium: 4.1 mmol/L (ref 3.5–5.1)
Sodium: 135 mmol/L (ref 135–145)

## 2023-07-21 LAB — PROCALCITONIN: Procalcitonin: 5.06 ng/mL

## 2023-07-21 LAB — GLUCOSE, CAPILLARY
Glucose-Capillary: 181 mg/dL — ABNORMAL HIGH (ref 70–99)
Glucose-Capillary: 274 mg/dL — ABNORMAL HIGH (ref 70–99)
Glucose-Capillary: 166 mg/dL — ABNORMAL HIGH (ref 70–99)

## 2023-07-21 NOTE — Evaluation (Addendum)
Clinical/Bedside Swallow Evaluation Patient Details  Name: Mckenzie Lawrence MRN: [id number] Date of Birth: Nov 15, 1945  Today's Date: 07/21/2023 Time: SLP Start Time (ACUTE ONLY): 1422 SLP Stop Time (ACUTE ONLY): 1435 SLP Time Calculation (min) (ACUTE ONLY): 13 min  Past Medical History:  Past Medical History:  Diagnosis Date   Anemia    Arthritis    Bladder incontinence    Broken foot    Cataracts, bilateral    Chronic kidney insufficiency    stage 3b   COPD (chronic obstructive pulmonary disease) (HCC)    wheezing   Coronary artery disease 04/25/2022   in [place]   COVID 2021   very mild case   CVA (cerebral vascular accident) (HCC) 2016   has had 3 strokes, states right side is slightlyweaker than left   Diabetes mellitus    insulin dependent, Type 2   GERD (gastroesophageal reflux disease)    HLD (hyperlipidemia)    Hx of cardiovascular stress test    a. ETT (6/13):  Ex 5:13; no ischemic changes   Hypertension    controlled on meds   Lacunar stroke of left subthalamic region [facility]) 02/2015   Leg pain    left   Lower back pain    Neuromuscular disorder (HCC)    stroke right hand tingling   Orthostatic hypotension    Osteopenia 01/2017   T score -2.0 stable from prior DEXA   Pancreatitis 10/2021   PCOS (polycystic ovarian syndrome)    Personal history of tobacco use, presenting hazards to health 01/09/2015   PONV (postoperative nausea and vomiting)    Sleep apnea    uses CPAP nightly   Past Surgical History:  Past Surgical History:  Procedure Laterality Date   BIOPSY  08/28/2022   Procedure: BIOPSY;  Surgeon: Kathi Der, MD;  Location: [facility];  Service: Gastroenterology;;   BOTOX INJECTION  01/15/2022   Procedure: BOTOX INJECTION;  Surgeon: Vida Rigger, MD;  Location: [facility];  Service: Gastroenterology;;   BOTOX INJECTION N/A 08/28/2022   Procedure: BOTOX INJECTION;  Surgeon: Kathi Der, MD;  Location: [facility];  Service: Gastroenterology;   Laterality: N/A;   BOTOX INJECTION Bilateral 05/27/2023   Procedure: BOTOX INJECTION;  Surgeon: Vida Rigger, MD;  Location: [facility];  Service: Gastroenterology;  Laterality: Bilateral;   BROW LIFT Bilateral 11/04/2017   Procedure: BLEPHAROPLASTY UPPER EYELID W/EXCESS SKIN;  Surgeon: Imagene Riches, MD;  Location: [facility];  Service: Ophthalmology;  Laterality: Bilateral;  DIABETES-insulin dependent uses CPAP   CARDIAC CATHETERIZATION  20 yrs ago   found nothing   CARPAL TUNNEL RELEASE Bilateral    CATARACT EXTRACTION Bilateral    ELBOW SURGERY Bilateral    ESOPHAGOGASTRODUODENOSCOPY N/A 07/25/2021   Procedure: ESOPHAGOGASTRODUODENOSCOPY (EGD);  Surgeon: Toledo, Boykin Nearing, MD;  Location: [facility];  Service: Gastroenterology;  Laterality: N/A;  IDDM   ESOPHAGOGASTRODUODENOSCOPY N/A 01/15/2022   Procedure: ESOPHAGOGASTRODUODENOSCOPY (EGD);  Surgeon: Vida Rigger, MD;  Location: [facility];  Service: Gastroenterology;  Laterality: N/A;  botox   ESOPHAGOGASTRODUODENOSCOPY (EGD) WITH PROPOFOL N/A 08/28/2022   Procedure: ESOPHAGOGASTRODUODENOSCOPY (EGD) WITH PROPOFOL;  Surgeon: Kathi Der, MD;  Location: [facility];  Service: Gastroenterology;  Laterality: N/A;   ESOPHAGOGASTRODUODENOSCOPY (EGD) WITH PROPOFOL Bilateral 05/27/2023   Procedure: ESOPHAGOGASTRODUODENOSCOPY (EGD) WITH PROPOFOL;  Surgeon: Vida Rigger, MD;  Location: [facility];  Service: Gastroenterology;  Laterality: Bilateral;   FOOT SURGERY     Groin Abscess     HAND SURGERY     KNEE SURGERY Bilateral  LABIAL ABSCESS     LUMBAR LAMINECTOMY/DECOMPRESSION MICRODISCECTOMY Left 05/11/2018   Procedure: LUMBAR LAMINECTOMY/DECOMPRESSION MICRODISCECTOMY 1 LEVEL- L4-5;  Surgeon: Lucy Chris, MD;  Location: [facility];  Service: Neurosurgery;  Laterality: Left;   LUMBAR LAMINECTOMY/DECOMPRESSION MICRODISCECTOMY Left 05/20/2022   Procedure: MICRODISCECTOMY L3-4;  Surgeon: Tressie Stalker, MD;  Location: [facility] OR;   Service: Neurosurgery;  Laterality: Left;  3C   LUMBAR WOUND DEBRIDEMENT N/A 08/08/2022   Procedure: INCISION AND DRAINAGE OF LUMBAR WOUND;  Surgeon: Tressie Stalker, MD;  Location[facility] OR;  Service: Neurosurgery;  Laterality: N/A;  3C   OOPHORECTOMY     BSO   PUBO VAG SLING     SHOULDER SURGERY     bilateral arthroscopies   VAGINAL HYSTERECTOMY  1979   HPI:  MASAKO Lawrence is a 78 y.o. female with medical history significant for COPD, CVA,CKD 4, SIADH, esophageal dysphagia treated with botox via EGD(last treatment 08/2022), type 2 diabetes mellitus, hyperlipidemia, hypertension being admitted for pneumonia.  Patient has a 3-day history of cough and shortness of breath and went to the urgent care where she was found to be febrile to 101.  Chest x-ray revealed pneumonia and she was discharged with amoxicillin and doxycycline.  On her arrival home however, she had an episode of shortness of breath and her daughter who is a nurse checked her vitals and found her to be hypoxic and tachycardic and called EMS to bring her to the hospital for evaluation (07/19/2023). Chest x-ray showing "Patchy airspace opacities and interstitial coarsening greatest in the right mid and lower lung concerning for infection or aspiration.  Emphysema". 05/27/2023   A small hiatal hernia was present.  No appreciable esophageal motility was noted. In addition, a hypertonic lower esophageal sphincter was found. There was mild resistance to endoscope advancement into the stomach. Area was successfully injected with 100 units bolutinum toxin. The esophageal examination was consistent with achalasia.  A small amount of food (residue) was found in the bastric fundus.    Assessment / Plan / Recommendation  Clinical Impression  Pt seen at bedside for re-assessment of swallow function and safety. Chart review indicates EGD and treatment on 05/27/2023 "A small hiatal hernia was present.  NO appreciable esophageal motility was noted". Pt was  seated upright in bed, awake and alert. Daughter at bedside. CN exam unremarkable, volitional cough strong.   Pt accepted trials of thin liquid via cup and straw, puree, and solid textures. She reports she was given graham crackers earlier and tolerated them well. No obvious oral issues or overt s/s aspiration following any PO presentation. Pt is at increased risk for aspiration given significant esophageal motility issues.  Pt verbalizes awareness of esophageal issues, which textures she needs to avoid, and compensatory strategies to maximize esophageal clearing. Recommend regular diet with thin liquids with continued adherence to esophageal swallow precautions. Meds as tolerated. ST signing off at this time. Please reconsult if needs arise. RN/MD informed.  SLP Visit Diagnosis: Dysphagia, unspecified (R13.10)    Aspiration Risk  Mild aspiration risk    Diet Recommendation Regular;Thin liquid    Liquid Administration via: Cup;Straw Medication Administration: Other (Comment) (as tolerated) Supervision: Intermittent supervision to cue for compensatory strategies;Patient able to self feed Compensations: Minimize environmental distractions;Slow rate;Small sips/bites Postural Changes: Seated upright at 90 degrees;Remain upright for at least 30 minutes after po intake    Other  Recommendations Recommended Consults: Consider esophageal assessment;Consider GI evaluation (if needed) Oral Care Recommendations: Oral care BID    Recommendations  for follow up therapy are one component of a multi-disciplinary discharge planning process, led by the attending physician.  Recommendations may be updated based on patient status, additional functional criteria and insurance authorization.  Follow up Recommendations No SLP follow up      Functional Status Assessment Patient has not had a recent decline in their functional status      Prognosis Prognosis for improved oropharyngeal function: Good       Swallow Study   General Date of Onset: 07/19/23 HPI: NAKIMA FLUEGGE is a 78 y.o. female with medical history significant for COPD, CVA,CKD 4, SIADH, esophageal dysphagia treated with botox via EGD(last treatment 08/2022), type 2 diabetes mellitus, hyperlipidemia, hypertension being admitted for pneumonia.  Patient has a 3-day history of cough and shortness of breath and went to the urgent care where she was found to be febrile to 101.  Chest x-ray revealed pneumonia and she was discharged with amoxicillin and doxycycline.  On her arrival home however, she had an episode of shortness of breath and her daughter who is a nurse checked her vitals and found her to be hypoxic and tachycardic and called EMS to bring her to the hospital for evaluation (07/19/2023). Chest x-ray showing "Patchy airspace opacities and interstitial coarsening greatest in the right mid and lower lung concerning for infection or aspiration.  Emphysema". 05/27/2023   A small hiatal hernia was present.  No appreciable esophageal motility was noted. In addition, a hypertonic lower esophageal sphincter was found. There was mild resistance to endoscope advancement into the stomach. Area was successfully injected with 100 units bolutinum toxin. The esophageal examination was consistent with achalasia.  A small amount of food (residue) was found in the bastric fundus. Type of Study: Bedside Swallow Evaluation Previous Swallow Assessment: BSE 07/20/23 - clear liquid recommended - pt declined presentations other than thin liquid Diet Prior to this Study: Clear liquid diet Temperature Spikes Noted: No Respiratory Status: Room air History of Recent Intubation: No Behavior/Cognition: Alert;Cooperative;Pleasant mood Oral Cavity Assessment: Within Functional Limits Oral Care Completed by SLP: No Oral Cavity - Dentition: Adequate natural dentition Self-Feeding Abilities: Able to feed self;Needs assist Patient Positioning: Upright in bed Baseline  Vocal Quality: Normal Volitional Cough: Strong Volitional Swallow: Able to elicit    Oral/Motor/Sensory Function Lawrence Oral Motor/Sensory Function: Within functional limits   Ice Chips Ice chips: Within functional limits Presentation: Spoon   Thin Liquid Thin Liquid: Within functional limits Presentation: Cup;Self Fed;Straw    Nectar Thick Nectar Thick Liquid: Not tested   Honey Thick Honey Thick Liquid: Not tested   Puree Puree: Within functional limits Presentation: Self Fed;Spoon   Solid     Solid: Within functional limits Presentation: Self Fed     Taeden Geller B. Murvin Natal, [facility], CCC-SLP Speech Language Pathologist  Leigh Aurora 07/21/2023,2:46 PM

## 2023-07-21 NOTE — Progress Notes (Signed)
Progress Note   Patient: Mckenzie Lawrence ZOX:[id number] DOB: 11/13/1945 DOA: 07/19/2023     1 DOS: the patient was seen and examined on 07/21/2023   Brief hospital course: 78yo with h/o COPD, CVA, stage 4 CKD, SIADH, esophageal dysphagia treated with Botox (last in 05/2023), DM, HLD, and HTN who presented on 2/1 with cough, SOB.  Outpatient CXR with PNA, treated x 1 dose with Amoxil and Doxy but worsened and so came in.  SIRS criteria on presentation with lactate to 2.0, concerning for sepsis.  Creatinine 3.2, up from baseline 2.7.  CXR with multifocal infiltrates concerning for infection vs. Aspiration.   Treated with Ceftriaxone/Azithromycin.  No dysphagia symptoms at home.   Refused solid food during SLP evaluation so will remain on CLD until patient is ready to advance diet (will need another SLP evaluation at that time).   Assessment and Plan:  Sepsis due to CAP (community acquired pneumonia) vs. Aspiration PNA  SIRS criteria include low-grade temp, tachycardia, tachypnea, leukocytosis with elevated lactate suggestive of sepsis Chest x-ray concerning for x-ray aspiration vs. CAP Continue Rocephin and azithromycin Antitussives, incentive spirometer Wheezing this AM, add neb treatments Supplemental oxygen to keep sats over 92% Lactate has normalized Elevated procalcitonin compared to on admission   Esophageal dysphagia Treated with Botox via EGD, last treatment 05/2023 Aspiration precautions Will get SLP eval given possible aspiration pneumonia Given lack of obvious symptoms, will start CLD SLP was able to confirm that CLD is appropriate but patient and daughter refused further evaluation on admission Now wants to advance her diet with will reconsult SLP  She will need outpatient f/u with Dr. Ewing Schlein due to concern for persistent/silent aspiration   Acute renal failure superimposed on stage 4 chronic kidney disease  Creatinine 3.2, baseline 2.6 Avoid nephrotoxins, renally dose meds Hold  losartan IVF Nephrology notified of admission but there is no obvious need for consult currently   CAD (coronary artery disease) No complaints of chest pain Continue rosuvastatin, metoprolol, losartan and aspirin   Hypertensive urgency SBP over 200 on arrival Continue home metoprolol and amlodipine Hold losartan due to renal condition Hydralazine IV as needed for additional control   OSA (obstructive sleep apnea) CPAP nightly   SIADH (syndrome of inappropriate ADH production)  Mild hyponatremia of 131 with known SIADH on presentation Resolved   COPD (chronic obstructive pulmonary disease)  Not acutely exacerbated DuoNebs as needed   Type 2 diabetes mellitus with hyperlipidemia  Not eating currently, will hold glargine Sliding scale coverage   DNR Discussed with patient/daughter and she wants to be DNR Code status changed accordingly        Consultants: SLP PT OT TOC team   Procedures: None   Antibiotics: Ceftriaxone 2/1-5 Azithromycin 2/1-5    30 Day Unplanned Readmission Risk Score    Flowsheet Row ED to Hosp-Admission (Current) from 07/19/2023 in [facility]  30 Day Unplanned Readmission Risk Score (%) 25.18 Filed at 07/21/2023 0802       This score is the patient's risk of an unplanned readmission within 30 days of being discharged (0 -100%). The score is based on dignosis, age, lab data, medications, orders, and past utilization.   Low:  0-14.9   Medium: 15-21.9   High: 22-29.9   Extreme: 30 and above           Subjective: Feeling some better.  Hungry, would like to advance her diet.  Lives with her husband, who has dementia with angry outbursts  at times; daughter is thinking they may both need ALF.  She feels very weak, would like PT/OT consults.   Objective: Vitals:   07/21/23 0843 07/21/23 1300  BP: (!) 188/69 (!) 147/44  Pulse: 89 77  Resp: 16   Temp: 97.9 F (36.6 C)   SpO2: 99% 99%     Intake/Output Summary (Last 24 hours) at 07/21/2023 1429 Last data filed at 07/21/2023 0336 Gross per 24 hour  Intake 275 ml  Output --  Net 275 ml   Filed Weights   07/21/23 0520  Weight: 74.3 kg    Exam:  General:  Appears calm, better than yesterday, on RA  Eyes:  EOMI, normal lids, iris ENT:  grossly normal hearing, lips & tongue, mmm Neck:  no LAD, masses or thyromegaly Cardiovascular:  RRR, no m/r/g. No LE edema.  Respiratory:   CTAB.  Normal to mildly increased respiratory effort. Abdomen:  soft, NT, ND Skin:  no rash or induration seen on limited exam Musculoskeletal:  grossly normal tone BUE/BLE, good ROM, no bony abnormality Psychiatric:  blunted mood and affect, speech fluent and appropriate, AOx3 Neurologic:  CN 2-12 grossly intact, moves all extremities in coordinated fashion  Data Reviewed: I have reviewed the patient's lab results since admission.  Pertinent labs for today include:   CO2 19, improved Glucose 181 BUN 59/Creatinine 3.15/GFR 15 Procalcitonin 5.06 WBC 7.7 Hgb 8.4    Family Communication: Daughter was present  Disposition: Status is: Inpatient Remains inpatient appropriate because: ongoing management     Time spent: 50 minutes  Unresulted Labs (From admission, onward)     Start     Ordered   07/22/23 0500  CBC with Differential/Platelet  Tomorrow morning,   R       Question:  Specimen collection method  Answer:  Lab=Lab collect   07/21/23 1429   07/22/23 0500  Basic metabolic panel  Tomorrow morning,   R       Question:  Specimen collection method  Answer:  Lab=Lab collect   07/21/23 1429             Author: Jonah Blue, MD 07/21/2023 2:29 PM  For on call review [email or web address].

## 2023-07-21 NOTE — Evaluation (Signed)
Physical Therapy Evaluation Patient Details Name: Mckenzie Lawrence MRN: [id number] DOB: 01-03-1946 Today's Date: 07/21/2023  History of Present Illness  Pt admitted for sepsis secondary to pneumonia. History includes COPD, CVA, CKD, DM, HLD, HTN.  Clinical Impression  Pt is a pleasant 78 year old female who was admitted for sepsis secondary to pneumonia. Pt performs bed mobility with mod I, transfers with cga, and ambulation with supervision and RW. Pt demonstrates deficits with strength/mobility/endurance. Pt is requesting HHPT post discharge. Would benefit from skilled PT to address above deficits and promote optimal return to PLOF. Pt will continue to receive skilled PT services while admitted and will defer to TOC/care team for updates regarding disposition planning.         If plan is discharge home, recommend the following: A little help with walking and/or transfers;A little help with bathing/dressing/bathroom;Help with stairs or ramp for entrance   Can travel by private vehicle        Equipment Recommendations None recommended by PT  Recommendations for Other Services       Functional Status Assessment Patient has had a recent decline in their functional status and demonstrates the ability to make significant improvements in function in a reasonable and predictable amount of time.     Precautions / Restrictions Precautions Precautions: Fall Restrictions Weight Bearing Restrictions Per Provider Order: No      Mobility  Bed Mobility Overal bed mobility: Modified Independent             General bed mobility comments: safe technique. Ease of transition    Transfers Overall transfer level: Needs assistance Equipment used: Rolling walker (2 wheels) Transfers: Sit to/from Stand Sit to Stand: Contact guard assist           General transfer comment: safe technique with 1 cue for hand placement.    Ambulation/Gait Ambulation/Gait assistance: Supervision Gait  Distance (Feet): 40 Feet Assistive device: Rolling walker (2 wheels) Gait Pattern/deviations: Step-through pattern       General Gait Details: ambulated in room with reciprocal gait pattern. Pt declined to ambulate further due to fatigue  Stairs            Wheelchair Mobility     Tilt Bed    Modified Rankin (Stroke Patients Only)       Balance Overall balance assessment: Mild deficits observed, not formally tested                                           Pertinent Vitals/Pain Pain Assessment Pain Assessment: No/denies pain    Home Living Family/patient expects to be discharged to:: Private residence Living Arrangements: Spouse/significant other;Children Available Help at Discharge: Family;Available 24 hours/day Type of Home: House Home Access: Stairs to enter Entrance Stairs-Rails: Right;Left;Can reach both Entrance Stairs-Number of Steps: 4-5   Home Layout: One level Home Equipment: BSC/3in1;Shower seat - built in;Grab bars - toilet;Grab bars - tub/shower;Rollator (4 wheels);Rolling Walker (2 wheels);Hand held shower head;Adaptive equipment;Other (comment)      Prior Function Prior Level of Function : Independent/Modified Independent             Mobility Comments: ambulatory without AD previously. Reports 1 recent fall ADLs Comments: indep     Extremity/Trunk Assessment   Upper Extremity Assessment Upper Extremity Assessment: Overall WFL for tasks assessed    Lower Extremity Assessment Lower Extremity Assessment: Generalized weakness (B LE grossly  4/5)       Communication   Communication Communication: No apparent difficulties  Cognition Arousal: Alert Behavior During Therapy: WFL for tasks assessed/performed Overall Cognitive Status: Within Functional Limits for tasks assessed                                          General Comments      Exercises Other Exercises Other Exercises: Pt ambulated to  bathroom with supervision. Pt has difficulty rising from low toliet. Cues for safety   Assessment/Plan    PT Assessment Patient needs continued PT services  PT Problem List Decreased strength;Decreased balance;Decreased mobility;Cardiopulmonary status limiting activity       PT Treatment Interventions DME instruction;Gait training;Therapeutic exercise;Balance training    PT Goals (Current goals can be found in the Care Plan section)  Acute Rehab PT Goals Patient Stated Goal: to go home PT Goal Formulation: With patient Time For Goal Achievement: 08/04/23 Potential to Achieve Goals: Good    Frequency Min 1X/week     Co-evaluation               AM-PAC PT "6 Clicks" Mobility  Outcome Measure Help needed turning from your back to your side while in a flat bed without using bedrails?: None Help needed moving from lying on your back to sitting on the side of a flat bed without using bedrails?: None Help needed moving to and from a bed to a chair (including a wheelchair)?: A Little Help needed standing up from a chair using your arms (e.g., wheelchair or bedside chair)?: A Little Help needed to walk in hospital room?: A Little Help needed climbing 3-5 steps with a railing? : A Lot 6 Click Score: 19    End of Session   Activity Tolerance: Patient tolerated treatment well Patient left: in bed;with bed alarm set Nurse Communication: Mobility status PT Visit Diagnosis: Muscle weakness (generalized) (M62.81);Difficulty in walking, not elsewhere classified (R26.2);Unsteadiness on feet (R26.81)    Time: 6045-4098 PT Time Calculation (min) (ACUTE ONLY): 16 min   Charges:   PT Evaluation $PT Eval Low Complexity: 1 Low PT Treatments $Therapeutic Activity: 8-22 mins PT General Charges $$ ACUTE PT VISIT: 1 Visit         Mckenzie Lawrence, PT, DPT, GCS [phone]   Mckenzie Lawrence 07/21/2023, 4:24 PM

## 2023-07-21 NOTE — Hospital Course (Signed)
77yo with h/o COPD, CVA, stage 4 CKD, SIADH, esophageal dysphagia treated with Botox (last in 05/2023), DM, HLD, and HTN who presented on 2/1 with cough, SOB.  Outpatient CXR with PNA, treated x 1 dose with Amoxil and Doxy but worsened and so came in.  SIRS criteria on presentation with lactate to 2.0, concerning for sepsis.  Creatinine 3.2, up from baseline 2.7.  CXR with multifocal infiltrates concerning for infection vs. Aspiration.   Treated with Ceftriaxone/Azithromycin.  No dysphagia symptoms at home.   Refused solid food during SLP evaluation so will remain on CLD until patient is ready to advance diet (will need another SLP evaluation at that time).

## 2023-07-22 DIAGNOSIS — J189 Pneumonia, unspecified organism: Secondary | ICD-10-CM | POA: Diagnosis not present

## 2023-07-22 DIAGNOSIS — B9562 Methicillin resistant Staphylococcus aureus infection as the cause of diseases classified elsewhere: Secondary | ICD-10-CM | POA: Diagnosis not present

## 2023-07-22 DIAGNOSIS — R7881 Bacteremia: Secondary | ICD-10-CM

## 2023-07-22 DIAGNOSIS — F17210 Nicotine dependence, cigarettes, uncomplicated: Secondary | ICD-10-CM | POA: Diagnosis not present

## 2023-07-22 LAB — BLOOD CULTURE ID PANEL (REFLEXED) - BCID2

## 2023-07-22 LAB — BASIC METABOLIC PANEL
Anion gap: 10 (ref 5–15)
BUN: 56 mg/dL — ABNORMAL HIGH (ref 8–23)
CO2: 16 mmol/L — ABNORMAL LOW (ref 22–32)
Calcium: 8.7 mg/dL — ABNORMAL LOW (ref 8.9–10.3)
Chloride: 107 mmol/L (ref 98–111)
Creatinine, Ser: 2.96 mg/dL — ABNORMAL HIGH (ref 0.44–1.00)
GFR, Estimated: 16 mL/min — ABNORMAL LOW (ref >60)
Glucose, Bld: 162 mg/dL — ABNORMAL HIGH (ref 70–99)
Potassium: 3.9 mmol/L (ref 3.5–5.1)
Sodium: 133 mmol/L — ABNORMAL LOW (ref 135–145)

## 2023-07-22 LAB — CBC WITH DIFFERENTIAL/PLATELET
Abs Immature Granulocytes: 0.07 10*3/uL (ref 0.00–0.07)
Basophils Absolute: 0 10*3/uL (ref 0.0–0.1)
Basophils Relative: 0 %
Eosinophils Absolute: 0.1 10*3/uL (ref 0.0–0.5)
Eosinophils Relative: 2 %
HCT: 23.8 % — ABNORMAL LOW (ref 36.0–46.0)
Hemoglobin: 8.2 g/dL — ABNORMAL LOW (ref 12.0–15.0)
Immature Granulocytes: 1 %
Lymphocytes Relative: 13 %
Lymphs Abs: 0.8 10*3/uL (ref 0.7–4.0)
MCH: 32.4 pg (ref 26.0–34.0)
MCHC: 34.5 g/dL (ref 30.0–36.0)
MCV: 94.1 fL (ref 80.0–100.0)
Monocytes Absolute: 0.6 10*3/uL (ref 0.1–1.0)
Monocytes Relative: 9 %
Neutro Abs: 4.9 10*3/uL (ref 1.7–7.7)
Neutrophils Relative %: 75 %
Platelets: 187 10*3/uL (ref 150–400)
RBC: 2.53 MIL/uL — ABNORMAL LOW (ref 3.87–5.11)
RDW: 13.7 % (ref 11.5–15.5)
WBC: 6.5 10*3/uL (ref 4.0–10.5)
nRBC: 0 % (ref 0.0–0.2)

## 2023-07-22 LAB — GLUCOSE, CAPILLARY
Glucose-Capillary: 164 mg/dL — ABNORMAL HIGH (ref 70–99)
Glucose-Capillary: 157 mg/dL — ABNORMAL HIGH (ref 70–99)
Glucose-Capillary: 202 mg/dL — ABNORMAL HIGH (ref 70–99)
Glucose-Capillary: 115 mg/dL — ABNORMAL HIGH (ref 70–99)

## 2023-07-22 LAB — MRSA NEXT GEN BY PCR, NASAL: MRSA by PCR Next Gen: NOT DETECTED

## 2023-07-22 MED ORDER — VANCOMYCIN VARIABLE DOSE PER UNSTABLE RENAL FUNCTION (PHARMACIST DOSING)
Status: DC
Start: 2023-07-22 — End: 2023-07-24

## 2023-07-22 MED ORDER — VANCOMYCIN HCL 1500 MG/300ML IV SOLN
1500.0000 mg | INTRAVENOUS | Status: AC
  Administered 2023-07-22: 1500 mg via INTRAVENOUS
  Filled 2023-07-22: qty 300

## 2023-07-22 NOTE — Plan of Care (Signed)
  Problem: Education: Goal: Ability to describe self-care measures that may prevent or decrease complications (Diabetes Survival Skills Education) will improve Outcome: Progressing Goal: Individualized Educational Video(s) Outcome: Progressing   Problem: Coping: Goal: Ability to adjust to condition or change in health will improve Outcome: Progressing   Problem: Fluid Volume: Goal: Ability to maintain a balanced intake and output will improve Outcome: Progressing   Problem: Health Behavior/Discharge Planning: Goal: Ability to identify and utilize available resources and services will improve Outcome: Progressing Goal: Ability to manage health-related needs will improve Outcome: Progressing   Problem: Metabolic: Goal: Ability to maintain appropriate glucose levels will improve Outcome: Progressing   Problem: Nutritional: Goal: Maintenance of adequate nutrition will improve Outcome: Progressing Goal: Progress toward achieving an optimal weight will improve Outcome: Progressing   Problem: Skin Integrity: Goal: Risk for impaired skin integrity will decrease Outcome: Progressing   Problem: Tissue Perfusion: Goal: Adequacy of tissue perfusion will improve Outcome: Progressing   Problem: Fluid Volume: Goal: Hemodynamic stability will improve Outcome: Progressing   Problem: Clinical Measurements: Goal: Diagnostic test results will improve Outcome: Progressing Goal: Signs and symptoms of infection will decrease Outcome: Progressing   Problem: Respiratory: Goal: Ability to maintain adequate ventilation will improve Outcome: Progressing   Problem: Clinical Measurements: Goal: Ability to maintain clinical measurements within normal limits will improve Outcome: Progressing Goal: Will remain free from infection Outcome: Progressing Goal: Diagnostic test results will improve Outcome: Progressing Goal: Respiratory complications will improve Outcome: Progressing Goal:  Cardiovascular complication will be avoided Outcome: Progressing   Problem: Health Behavior/Discharge Planning: Goal: Ability to manage health-related needs will improve Outcome: Progressing   Problem: Activity: Goal: Risk for activity intolerance will decrease Outcome: Progressing   Problem: Elimination: Goal: Will not experience complications related to bowel motility Outcome: Progressing Goal: Will not experience complications related to urinary retention Outcome: Progressing   Problem: Coping: Goal: Level of anxiety will decrease Outcome: Progressing   Problem: Pain Managment: Goal: General experience of comfort will improve and/or be controlled Outcome: Progressing   Problem: Safety: Goal: Ability to remain free from injury will improve Outcome: Progressing

## 2023-07-22 NOTE — Consult Note (Addendum)
 NAME: Mckenzie Lawrence  DOB: Dec 10, 1945  MRN: [id number]  Date/Time: 07/22/2023 4:02 PM  REQUESTING PROVIDER: Dr. barbarann Subjective:  REASON FOR CONSULT: MRSA bacteremia ? Mckenzie Lawrence is a 78 y.o. female with a history of COPD, hypertension, hyperlipidemia, type 2 diabetes, CKD, lumbar L4-L5 and L5-S1 laminotomy/foraminotomies and medial facetectomy and posterior lumbar interbody fusion in April 2023, left L3-L4 redo intervertebral discectomy in December 2023, and then January 2024 underwent L3-L4 fusion surgery with instrumentation,Which was complicated by wound drainage and was taken back to the OR on 08/08/2022 for I&D.  Infection was did not track up to the deep deep tissue and did not involve the hardware.  Culture was positive for MRSA.  She was seen by infectious disease at that time and as daughter did not want her to have IV antibiotics at home she received weekly long-acting antibiotic which was dalbavancin into 3 doses and 1 dose of oritavancin .  She was seen by Dr. Prentiss as outpatient on 09/04/2022 and was doing well with complete healing of the lumbar wound.  In August 2024 she had a PET scan for a new left upper lobe nodule which was concerning for neoplasm, no biopsy was done and the PET scan categorized it as lung RADS 4Bs suspicious.  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  She was seen by oncologist and referred to radiation oncology for  SBRT for stage I non-small cell lung cancer of the left upper lobe and completed in October 2024.  PET scan was repeated in December 2024 and that showed no evidence of recurrent or progressive disease.  Patient presents to the ED with shortness of breath, fever and tachycardia Patient says she had gone to her sister's funeral on Thursday.  That evening she felt very tired and was sleeping a lot She was coughing and her daughter took her to urgent care on 07/19/2023.  She was prescribed  doxycycline  and amoxicillin now was asked to get a chest x-ray.  When she went home she had shortness of breath and her daughter who is a nurse checked her vitals and found her to be hypoxic and tachycardic and called EMS and brought her to the emergency department.  In the ED vitals were  07/19/23  BP 151/60 !  Temp 99.3 F (37.4 C)  Pulse Rate 99  Resp 19  SpO2 95 %    Latest Reference Range & Units 07/19/23  WBC 4.0 - 10.5 K/uL 12.9 (H)  Hemoglobin 12.0 - 15.0 g/dL 9.0 (L)  HCT 63.9 - 53.9 % 27.2 (L)  Platelets 150 - 400 K/uL 207  Creatinine 0.44 - 1.00 mg/dL 6.79 (H)    Blood culture was checked on 07/19/2023. Chest x-ray showed , Patchy airspace opacities and interstitial coarsening greatest in the right middle and lower lung concerning for infection She was started on azithromycin  and ceftriaxone .  Procalcitonin was high I am asked to see the patient as blood cultures come back positive for MRSA. Patient  says she is feeling better today. She does not have any back pain.  Past Medical History:  Diagnosis Date   Anemia    Arthritis    Bladder incontinence    Broken foot    Cataracts, bilateral    Chronic kidney insufficiency    stage 3b   COPD (chronic obstructive pulmonary disease) (HCC)    wheezing   Coronary artery disease 04/25/2022   in [place]  COVID 2021   very mild case   CVA (cerebral vascular accident) (HCC) 2016   has had 3 strokes, states right side is slightlyweaker than left   Diabetes mellitus    insulin  dependent, Type 2   GERD (gastroesophageal reflux disease)    HLD (hyperlipidemia)    Hx of cardiovascular stress test    a. ETT (6/13):  Ex 5:13; no ischemic changes   Hypertension    controlled on meds   Lacunar stroke of left subthalamic region [facility]) 02/2015   Leg pain    left   Lower back pain    Neuromuscular disorder (HCC)    stroke right hand tingling   Orthostatic hypotension    Osteopenia 01/2017   T score -2.0 stable from prior DEXA    Pancreatitis 10/2021   PCOS (polycystic ovarian syndrome)    Personal history of tobacco use, presenting hazards to health 01/09/2015   PONV (postoperative nausea and vomiting)    Sleep apnea    uses CPAP nightly    Past Surgical History:  Procedure Laterality Date   BIOPSY  08/28/2022   Procedure: BIOPSY;  Surgeon: Elicia Claw, MD;  Location: [facility];  Service: Gastroenterology;;   BOTOX  INJECTION  01/15/2022   Procedure: BOTOX  INJECTION;  Surgeon: Rosalie Kitchens, MD;  Location: [facility];  Service: Gastroenterology;;   BOTOX  INJECTION N/A 08/28/2022   Procedure: BOTOX  INJECTION;  Surgeon: Elicia Claw, MD;  Location: [facility];  Service: Gastroenterology;  Laterality: N/A;   BOTOX  INJECTION Bilateral 05/27/2023   Procedure: BOTOX  INJECTION;  Surgeon: Rosalie Kitchens, MD;  Location: [facility];  Service: Gastroenterology;  Laterality: Bilateral;   BROW LIFT Bilateral 11/04/2017   Procedure: BLEPHAROPLASTY UPPER EYELID W/EXCESS SKIN;  Surgeon: Ashley Greig HERO, MD;  Location: [facility];  Service: Ophthalmology;  Laterality: Bilateral;  DIABETES-insulin  dependent uses CPAP   CARDIAC CATHETERIZATION  20 yrs ago   found nothing   CARPAL TUNNEL RELEASE Bilateral    CATARACT EXTRACTION Bilateral    ELBOW SURGERY Bilateral    ESOPHAGOGASTRODUODENOSCOPY N/A 07/25/2021   Procedure: ESOPHAGOGASTRODUODENOSCOPY (EGD);  Surgeon: Toledo, Ladell POUR, MD;  Location: [facility];  Service: Gastroenterology;  Laterality: N/A;  IDDM   ESOPHAGOGASTRODUODENOSCOPY N/A 01/15/2022   Procedure: ESOPHAGOGASTRODUODENOSCOPY (EGD);  Surgeon: Rosalie Kitchens, MD;  Location: [facility];  Service: Gastroenterology;  Laterality: N/A;  botox    ESOPHAGOGASTRODUODENOSCOPY (EGD) WITH PROPOFOL  N/A 08/28/2022   Procedure: ESOPHAGOGASTRODUODENOSCOPY (EGD) WITH PROPOFOL ;  Surgeon: Elicia Claw, MD;  Location: [facility];  Service: Gastroenterology;  Laterality: N/A;   ESOPHAGOGASTRODUODENOSCOPY (EGD)  WITH PROPOFOL  Bilateral 05/27/2023   Procedure: ESOPHAGOGASTRODUODENOSCOPY (EGD) WITH PROPOFOL ;  Surgeon: Rosalie Kitchens, MD;  Location: [facility];  Service: Gastroenterology;  Laterality: Bilateral;   FOOT SURGERY     Groin Abscess     HAND SURGERY     KNEE SURGERY Bilateral    LABIAL ABSCESS     LUMBAR LAMINECTOMY/DECOMPRESSION MICRODISCECTOMY Left 05/11/2018   Procedure: LUMBAR LAMINECTOMY/DECOMPRESSION MICRODISCECTOMY 1 LEVEL- L4-5;  Surgeon: Bluford Standing, MD;  Location: [facility];  Service: Neurosurgery;  Laterality: Left;   LUMBAR LAMINECTOMY/DECOMPRESSION MICRODISCECTOMY Left 05/20/2022   Procedure: MICRODISCECTOMY L3-4;  Surgeon: Mavis Purchase, MD;  Location: [facility];  Service: Neurosurgery;  Laterality: Left;  3C   LUMBAR WOUND DEBRIDEMENT N/A 08/08/2022   Procedure: INCISION AND DRAINAGE OF LUMBAR WOUND;  Surgeon: Mavis Purchase, MD;  Location: [facility] OR;  Service: Neurosurgery;  Laterality: N/A;  3C   OOPHORECTOMY     BSO   PUBO VAG SLING  SHOULDER SURGERY     bilateral arthroscopies   VAGINAL HYSTERECTOMY  1979    Social History   Socioeconomic History   Marital status: Married    Spouse name: Not on file   Number of children: 1   Years of education: Not on file   Highest education level: Not on file  Occupational History   Not on file  Tobacco Use   Smoking status: Every Day    Current packs/day: 1.50    Average packs/day: 1.5 packs/day for 50.0 years (75.0 ttl pk-yrs)    Types: Cigarettes   Smokeless tobacco: Never   Tobacco comments:    3ppd x 10 year, then cut back to 1.5pdd since 02/2015  Vaping Use   Vaping status: Never Used  Substance and Sexual Activity   Alcohol use: Never    Alcohol/week: 0.0 standard drinks of alcohol   Drug use: No   Sexual activity: Not Currently    Birth control/protection: Surgical    Comment: Hx Hysterectomy, 1st intercourse 78 yo-Fewer than 5 partners  Other Topics Concern   Not on file  Social History Narrative   Lives at  home with husband in a one story home.  Has 1 daughter.     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     Education: some college.   Social Drivers of Corporate Investment Banker Strain: Low Risk  (02/10/2023)   Received from [facility]   Overall Financial Resource Strain (CARDIA)    Difficulty of Paying Living Expenses: Not hard at all  Food Insecurity: No Food Insecurity (07/20/2023)   Hunger Vital Sign    Worried About Running Out of Food in the Last Year: Never true    Ran Out of Food in the Last Year: Never true  Transportation Needs: No Transportation Needs (07/20/2023)   PRAPARE - Administrator, Civil Service (Medical): No    Lack of Transportation (Non-Medical): No  Physical Activity: Not on file  Stress: Not on file  Social Connections: Unknown (07/21/2023)   Social Connection and Isolation Panel [NHANES]    Frequency of Communication with Friends and Family: More than three times a week    Frequency of Social Gatherings with Friends and Family: More than three times a week    Attends Religious Services: Patient declined    Database Administrator or Organizations: Patient declined    Attends Banker Meetings: Patient declined    Marital Status: Married  Catering Manager Violence: Not At Risk (07/20/2023)   Humiliation, Afraid, Rape, and Kick questionnaire    Fear of Current or Ex-Partner: No    Emotionally Abused: No    Physically Abused: No    Sexually Abused: No    Family History  Problem Relation Age of Onset   Hypertension Mother    Heart disease Mother 29       MI   Diabetes Father    Diabetes Sister    Hypertension Sister    Diabetes Brother    Hypertension Brother    Heart disease Brother 29       CAD   Cancer Sister        Multiple myloma   Diabetes Brother    Allergies  Allergen Reactions   Iodine Anaphylaxis and Other (See Comments)    Pt states that she is allergic to ingested iodine only, okay for  betadine.     Iodine I 131 Tositumomab Anaphylaxis  Shellfish Allergy Anaphylaxis   Codeine Nausea And Vomiting   Morphine  Sulfate Nausea And Vomiting   Irbesartan Other (See Comments)     Unknown  (Avapro)   Sulfa Antibiotics Other (See Comments)    Fever    I? Current Facility-Administered Medications  Medication Dose Route Frequency Provider Last Rate Last Admin   acetaminophen  (TYLENOL ) tablet 650 mg  650 mg Oral Q6H PRN Duncan, Hazel V, MD   650 mg at 07/21/23 1337   Or   acetaminophen  (TYLENOL ) suppository 650 mg  650 mg Rectal Q6H PRN Duncan, Hazel V, MD       albuterol  (PROVENTIL ) (2.5 MG/3ML) 0.083% nebulizer solution 2.5 mg  2.5 mg Nebulization Q2H PRN Duncan, Hazel V, MD   2.5 mg at 07/20/23 2247   amLODipine  (NORVASC ) tablet 5 mg  5 mg Oral Daily Duncan, Hazel V, MD   5 mg at 07/22/23 9063   aspirin  EC tablet 81 mg  81 mg Oral QHS Duncan, Hazel V, MD   81 mg at 07/21/23 2028   azithromycin  (ZITHROMAX ) 500 mg in sodium chloride  0.9 % 250 mL IVPB  500 mg Intravenous Q24H Duncan, Hazel V, MD   Stopped at 07/21/23 2255   cefTRIAXone  (ROCEPHIN ) 2 g in sodium chloride  0.9 % 100 mL IVPB  2 g Intravenous Q24H Duncan, Hazel V, MD   Stopped at 07/21/23 2120   cyclobenzaprine  (FLEXERIL ) tablet 10 mg  10 mg Oral TID PRN Duncan, Hazel V, MD       docusate sodium  (COLACE) capsule 100 mg  100 mg Oral QHS PRN Duncan, Hazel V, MD       guaiFENesin  (MUCINEX ) 12 hr tablet 600 mg  600 mg Oral BID Duncan, Hazel V, MD   600 mg at 07/22/23 0936   heparin  injection 5,000 Units  5,000 Units Subcutaneous Q8H Cleatus Delayne GAILS, MD   5,000 Units at 07/22/23 1524   hydrALAZINE  (APRESOLINE ) injection 5 mg  5 mg Intravenous Q4H PRN Barbarann Nest, MD   5 mg at 07/21/23 0550   hydrOXYzine  (ATARAX ) tablet 50 mg  50 mg Oral QHS PRN Duncan, Hazel V, MD   50 mg at 07/20/23 2236   insulin  aspart (novoLOG ) injection 0-15 Units  0-15 Units Subcutaneous TID [facility] Barbarann Nest, MD   3 Units at 07/22/23 1424   insulin   aspart (novoLOG ) injection 0-5 Units  0-5 Units Subcutaneous QHS Barbarann Nest, MD       metoprolol  succinate (TOPROL -XL) 24 hr tablet 100 mg  100 mg Oral QHS Duncan, Hazel V, MD   100 mg at 07/21/23 2028   ondansetron  (ZOFRAN ) tablet 4 mg  4 mg Oral Q6H PRN Duncan, Hazel V, MD       Or   ondansetron  (ZOFRAN ) injection 4 mg  4 mg Intravenous Q6H PRN Duncan, Hazel V, MD       oxyCODONE  (Oxy IR/ROXICODONE ) immediate release tablet 5-10 mg  5-10 mg Oral Q4H PRN Duncan, Hazel V, MD       rosuvastatin  (CRESTOR ) tablet 5 mg  5 mg Oral Daily Duncan, Hazel V, MD   5 mg at 07/22/23 9062   sucralfate  (CARAFATE ) tablet 1 g  1 g Oral BID PRN Duncan, Hazel V, MD       vancomycin  variable dose per unstable renal function (pharmacist dosing)   Does not apply See admin instructions Zeigler, Dustin G, RPH         Abtx:  Anti-infectives (From admission, onward)    Start  Dose/Rate Route Frequency Ordered Stop   07/22/23 0915  vancomycin  (VANCOREADY) IVPB 1500 mg/300 mL        1,500 mg 150 mL/hr over 120 Minutes Intravenous NOW 07/22/23 0822 07/22/23 1143   07/22/23 0842  vancomycin  variable dose per unstable renal function (pharmacist dosing)         Does not apply See admin instructions 07/22/23 0842     07/20/23 2100  cefTRIAXone  (ROCEPHIN ) 2 g in sodium chloride  0.9 % 100 mL IVPB        2 g 200 mL/hr over 30 Minutes Intravenous Every 24 hours 07/19/23 2232 07/24/23 2059   07/20/23 2100  azithromycin  (ZITHROMAX ) 500 mg in sodium chloride  0.9 % 250 mL IVPB        500 mg 250 mL/hr over 60 Minutes Intravenous Every 24 hours 07/19/23 2232 07/24/23 2059   07/19/23 2030  cefTRIAXone  (ROCEPHIN ) 2 g in sodium chloride  0.9 % 100 mL IVPB        2 g 200 mL/hr over 30 Minutes Intravenous Once 07/19/23 2023 07/19/23 2110   07/19/23 2030  azithromycin  (ZITHROMAX ) 500 mg in sodium chloride  0.9 % 250 mL IVPB        500 mg 250 mL/hr over 60 Minutes Intravenous  Once 07/19/23 2023 07/19/23 2213       REVIEW  OF SYSTEMS:  Const:  fever,  chills, negative weight loss Eyes: negative diplopia or visual changes, negative eye pain ENT: negative coryza, negative sore throat Resp:  cough,  dyspnea, not very productive cough Cards: negative for chest pain, palpitations, lower extremity edema GU: negative for frequency, dysuria and hematuria GI: Negative for abdominal pain, diarrhea, bleeding, constipation Skin: negative for rash and pruritus Heme: negative for easy bruising and gum/nose bleeding MS: General Weakness Low back pain Neurolo:negative for headaches, dizziness, vertigo, memory problems  Psych: negative for feelings of anxiety, depression  Endocrine: diabetes Allergy/Immunology- negative for any medication or food allergies  Objective:  VITALS:  BP (!) 189/69 (BP Location: Right Arm)   Pulse 78   Temp 98.2 F (36.8 C)   Resp 18   Ht 5' 7 (1.702 m)   Wt 74.3 kg   SpO2 98%   BMI 25.66 kg/m   PHYSICAL EXAM:  General: Alert, cooperative, no distress, appears stated age.  Head: Normocephalic, without obvious abnormality, atraumatic. Eyes: Conjunctivae clear, anicteric sclerae. Pupils are equal ENT Nares normal. No drainage or sinus tenderness. Lips, mucosa, and tongue normal. No Thrush Neck: Supple, symmetrical, no adenopathy, thyroid: non tender no carotid bruit and no JVD. Back: No CVA tenderness. Lungs: Bilateral air entry Few rhonchi Heart: S1-S2 Abdomen: Soft, non-tender,not distended. Bowel sounds normal. No masses Extremities: atraumatic, no cyanosis. No edema. No clubbing Skin: No rashes or lesions. Or bruising Lymph: Cervical, supraclavicular normal. Neurologic: Grossly non-focal Pertinent Labs Lab Results CBC    Component Value Date/Time   WBC 6.5 07/22/2023 0505   RBC 2.53 (L) 07/22/2023 0505   HGB 8.2 (L) 07/22/2023 0505   HGB 13.6 12/01/2011 1656   HCT 23.8 (L) 07/22/2023 0505   HCT 40.9 12/01/2011 1656   PLT 187 07/22/2023 0505   PLT 299 12/01/2011  1656   MCV 94.1 07/22/2023 0505   MCV 95 12/01/2011 1656   MCH 32.4 07/22/2023 0505   MCHC 34.5 07/22/2023 0505   RDW 13.7 07/22/2023 0505   RDW 13.1 12/01/2011 1656   LYMPHSABS 0.8 07/22/2023 0505   MONOABS 0.6 07/22/2023 0505   EOSABS 0.1 07/22/2023 0505  BASOSABS 0.0 07/22/2023 0505       Latest Ref Rng & Units 07/22/2023    5:05 AM 07/21/2023    4:09 AM 07/20/2023   10:04 AM  CMP  Glucose 70 - 99 mg/dL 837  828  856   BUN 8 - 23 mg/dL 56  59  54   Creatinine 0.44 - 1.00 mg/dL 7.03  6.84  6.98   Sodium 135 - 145 mmol/L 133  135  134   Potassium 3.5 - 5.1 mmol/L 3.9  4.1  4.3   Chloride 98 - 111 mmol/L 107  107  107   CO2 22 - 32 mmol/L 16  19  16    Calcium  8.9 - 10.3 mg/dL 8.7  8.7  8.8       Microbiology: Recent Results (from the past 240 hours)  Resp panel by RT-PCR (RSV, Flu A&B, Covid) Anterior Nasal Swab     Status: None   Collection Time: 07/19/23  8:40 PM   Specimen: Anterior Nasal Swab  Result Value Ref Range Status   SARS Coronavirus 2 by RT PCR NEGATIVE NEGATIVE Final    Comment: (NOTE) SARS-CoV-2 target nucleic acids are NOT DETECTED.  The SARS-CoV-2 RNA is generally detectable in upper respiratory specimens during the acute phase of infection. The lowest concentration of SARS-CoV-2 viral copies this assay can detect is 138 copies/mL. A negative result does not preclude SARS-Cov-2 infection and should not be used as the sole basis for treatment or other patient management decisions. A negative result may occur with  improper specimen collection/handling, submission of specimen other than nasopharyngeal swab, presence of viral mutation(s) within the areas targeted by this assay, and inadequate number of viral copies(<138 copies/mL). A negative result must be combined with clinical observations, patient history, and epidemiological information. The expected result is Negative.  Fact Sheet for Patients:  bloggercourse.com  Fact  Sheet for Healthcare Providers:  seriousbroker.it  This test is no t yet approved or cleared by the [place]  FDA and  has been authorized for detection and/or diagnosis of SARS-CoV-2 by FDA under an Emergency Use Authorization (EUA). This EUA will remain  in effect (meaning this test can be used) for the duration of the COVID-19 declaration under Section 564(b)(1) of the Act, 21 U.S.C.section 360bbb-3(b)(1), unless the authorization is terminated  or revoked sooner.       Influenza A by PCR NEGATIVE NEGATIVE Final   Influenza B by PCR NEGATIVE NEGATIVE Final    Comment: (NOTE) The Xpert Xpress SARS-CoV-2/FLU/RSV plus assay is intended as an aid in the diagnosis of influenza from Nasopharyngeal swab specimens and should not be used as a sole basis for treatment. Nasal washings and aspirates are unacceptable for Xpert Xpress SARS-CoV-2/FLU/RSV testing.  Fact Sheet for Patients: bloggercourse.com  Fact Sheet for Healthcare Providers: seriousbroker.it  This test is not yet approved or cleared by the [place]  FDA and has been authorized for detection and/or diagnosis of SARS-CoV-2 by FDA under an Emergency Use Authorization (EUA). This EUA will remain in effect (meaning this test can be used) for the duration of the COVID-19 declaration under Section 564(b)(1) of the Act, 21 U.S.C. section 360bbb-3(b)(1), unless the authorization is terminated or revoked.     Resp Syncytial Virus by PCR NEGATIVE NEGATIVE Final    Comment: (NOTE) Fact Sheet for Patients: bloggercourse.com  Fact Sheet for Healthcare Providers: seriousbroker.it  This test is not yet approved or cleared by the [place]  FDA and has been authorized for detection  and/or diagnosis of SARS-CoV-2 by FDA under an Emergency Use Authorization (EUA). This EUA will remain in effect  (meaning this test can be used) for the duration of the COVID-19 declaration under Section 564(b)(1) of the Act, 21 U.S.C. section 360bbb-3(b)(1), unless the authorization is terminated or revoked.  Performed at [facility], [address] [place], [place] [address]   Blood Culture (routine x 2)     Status: None (Preliminary result)   Collection Time: 07/19/23  8:40 PM   Specimen: BLOOD  Result Value Ref Range Status   Specimen Description BLOOD  Final   Special Requests   Final    BOTTLES DRAWN AEROBIC AND ANAEROBIC Blood Culture results may not be optimal due to an inadequate volume of blood received in culture bottles   Culture   Final    NO GROWTH 3 DAYS Performed at [facility], [address] [place], [place] [address]    Report Status PENDING  Incomplete  Blood Culture (routine x 2)     Status: None (Preliminary result)   Collection Time: 07/19/23  8:40 PM   Specimen: BLOOD  Result Value Ref Range Status   Specimen Description   Final    BLOOD SITE NOT SPECIFIED Performed at [facility], [address], [place], [place] [address]    Special Requests   Final    BOTTLES DRAWN AEROBIC AND ANAEROBIC Blood Culture results may not be optimal due to an inadequate volume of blood received in culture bottles   Culture  Setup Time   Final    GRAM POSITIVE COCCI AEROBIC BOTTLE ONLY Organism ID to follow CRITICAL RESULT CALLED TO, READ BACK BY AND VERIFIED WITHBETHA RANKIN DILLS AT 0541 07/22/23 JG Performed at [facility], [address] [place], [place] [address]    Culture [facility] POSITIVE COCCI  Final   Report Status PENDING  Incomplete  Blood Culture ID Panel (Reflexed)     Status: Abnormal   Collection Time: 07/19/23  8:40 PM  Result Value Ref Range Status   Enterococcus faecalis NOT DETECTED NOT DETECTED Final   Enterococcus Faecium NOT DETECTED NOT DETECTED Final   Listeria monocytogenes NOT DETECTED NOT DETECTED Final    Staphylococcus species DETECTED (A) NOT DETECTED Final    Comment: CRITICAL RESULT CALLED TO, READ BACK BY AND VERIFIED WITH:  NATHAN BELUE AT 0541 07/22/23 JG    Staphylococcus aureus (BCID) DETECTED (A) NOT DETECTED Final    Comment: Methicillin (oxacillin)-resistant Staphylococcus aureus (MRSA). MRSA is predictably resistant to beta-lactam antibiotics (except ceftaroline). Preferred therapy is vancomycin  unless clinically contraindicated. Patient requires contact precautions if  hospitalized. CRITICAL RESULT CALLED TO, READ BACK BY AND VERIFIED WITH:  NATHAN BELUE AT 0541 07/22/23 JG    Staphylococcus epidermidis NOT DETECTED NOT DETECTED Final   Staphylococcus lugdunensis NOT DETECTED NOT DETECTED Final   Streptococcus species NOT DETECTED NOT DETECTED Final   Streptococcus agalactiae NOT DETECTED NOT DETECTED Final   Streptococcus pneumoniae NOT DETECTED NOT DETECTED Final   Streptococcus pyogenes NOT DETECTED NOT DETECTED Final   A.calcoaceticus-baumannii NOT DETECTED NOT DETECTED Final   Bacteroides fragilis NOT DETECTED NOT DETECTED Final   Enterobacterales NOT DETECTED NOT DETECTED Final   Enterobacter cloacae complex NOT DETECTED NOT DETECTED Final   Escherichia coli NOT DETECTED NOT DETECTED Final   Klebsiella aerogenes NOT DETECTED NOT DETECTED Final   Klebsiella oxytoca NOT DETECTED NOT DETECTED Final   Klebsiella pneumoniae NOT DETECTED NOT DETECTED Final  Proteus species NOT DETECTED NOT DETECTED Final   Salmonella species NOT DETECTED NOT DETECTED Final   Serratia marcescens NOT DETECTED NOT DETECTED Final   Haemophilus influenzae NOT DETECTED NOT DETECTED Final   Neisseria meningitidis NOT DETECTED NOT DETECTED Final   Pseudomonas aeruginosa NOT DETECTED NOT DETECTED Final   Stenotrophomonas maltophilia NOT DETECTED NOT DETECTED Final   Candida albicans NOT DETECTED NOT DETECTED Final   Candida auris NOT DETECTED NOT DETECTED Final   Candida glabrata NOT DETECTED NOT  DETECTED Final   Candida krusei NOT DETECTED NOT DETECTED Final   Candida parapsilosis NOT DETECTED NOT DETECTED Final   Candida tropicalis NOT DETECTED NOT DETECTED Final   Cryptococcus neoformans/gattii NOT DETECTED NOT DETECTED Final   Meth resistant mecA/C and MREJ DETECTED (A) NOT DETECTED Final    Comment: CRITICAL RESULT CALLED TO, READ BACK BY AND VERIFIED WITHBETHA RANKIN DILLS AT 0541 07/22/23 JG Performed at [facility], [address], [place], [place] [address]     IMAGING RESULTS:  I have personally reviewed the films Increased interstitial markings   Impression/Recommendation Patient presenting with cough, fever, shortness of breath of 3 days duration  Community-acquired pneumonia on azithromycin  and ceftriaxone  chest x-ray showed bilateral interstitial infiltrates more on the right side COVID, flu, RSV negative Will do respiratory viral PCR panel May need CT scan of the chest  MRSA bacteremia 1 out of 4 bottle Became positive after 48 hours so low bioburden Will repeat blood cultures today She is started on vancomycin  Monitor closely because of CKD. Chest for MRSA nares as well as for respiratory viral PCR  She will need 2D echo She has a history past history of MRSA lumbar spine infection.  Currently she does not have any pain in the site of previous surgeries healed completely low threshold for getting MRI of the spine  COPD  History of stage I non-small cell carcinoma of the left upper lobe status post SBRT  AKI on CKD  Anemia  H/o lumbar spine surgeries H/o MRSA infection post surgery in Feb 2024  Diabetes mellitus on insulin  Discussed the management with the daughter.  She does not want her to have IV antibiotics at home.  She would prefer oral antibiotics or antibiotics in short stay. The creatinine is worse now than  when compared to 2024 so dalbavancin dose will have to be adjusted  I have personally spent  ---minutes involved in  face-to-face and non-face-to-face activities for this patient on the day of the visit. Professional time spent includes the following activities: Preparing to see the patient (review of tests), Obtaining and/or reviewing separately obtained history (admission/discharge record), Performing a medically appropriate examination and/or evaluation , Ordering medications/tests/ and communicating with other health care professionals, Documenting clinical information in the EMR, Independently interpreting results (not separately reported), Communicating results to the patient/daughter, Counseling and educating the patient/ daughter and Care coordination (not separately reported).  It involved complex antimicrobial management , counseling and treatment  Note:  This document was prepared using Dragon voice recognition software and may include unintentional dictation errors.

## 2023-07-22 NOTE — Progress Notes (Signed)
 PHARMACY - PHYSICIAN COMMUNICATION CRITICAL VALUE ALERT - BLOOD CULTURE IDENTIFICATION (BCID)  Mckenzie Lawrence is an 78 y.o. female w/ PMH od HTN, HLD, DM, CKD who presented to [facility] on 07/19/2023 with a chief complaint of PNA  Assessment:  1/4 S aureus Mec A+  Name of physician (or Provider) Contacted: JINNY Herald, MD  Current antibiotics: azithromycin , ceftriaxone    Changes to prescribed antibiotics recommended:   Recommendations accepted by provider to add vancomycin   Results for orders placed or performed during the hospital encounter of 07/19/23  Blood Culture ID Panel (Reflexed) (Collected: 07/19/2023  8:40 PM)  Result Value Ref Range   Enterococcus faecalis NOT DETECTED NOT DETECTED   Enterococcus Faecium NOT DETECTED NOT DETECTED   Listeria monocytogenes NOT DETECTED NOT DETECTED   Staphylococcus species DETECTED (A) NOT DETECTED   Staphylococcus aureus (BCID) DETECTED (A) NOT DETECTED   Staphylococcus epidermidis NOT DETECTED NOT DETECTED   Staphylococcus lugdunensis NOT DETECTED NOT DETECTED   Streptococcus species NOT DETECTED NOT DETECTED   Streptococcus agalactiae NOT DETECTED NOT DETECTED   Streptococcus pneumoniae NOT DETECTED NOT DETECTED   Streptococcus pyogenes NOT DETECTED NOT DETECTED   A.calcoaceticus-baumannii NOT DETECTED NOT DETECTED   Bacteroides fragilis NOT DETECTED NOT DETECTED   Enterobacterales NOT DETECTED NOT DETECTED   Enterobacter cloacae complex NOT DETECTED NOT DETECTED   Escherichia coli NOT DETECTED NOT DETECTED   Klebsiella aerogenes NOT DETECTED NOT DETECTED   Klebsiella oxytoca NOT DETECTED NOT DETECTED   Klebsiella pneumoniae NOT DETECTED NOT DETECTED   Proteus species NOT DETECTED NOT DETECTED   Salmonella species NOT DETECTED NOT DETECTED   Serratia marcescens NOT DETECTED NOT DETECTED   Haemophilus influenzae NOT DETECTED NOT DETECTED   Neisseria meningitidis NOT DETECTED NOT DETECTED   Pseudomonas aeruginosa NOT DETECTED NOT  DETECTED   Stenotrophomonas maltophilia NOT DETECTED NOT DETECTED   Candida albicans NOT DETECTED NOT DETECTED   Candida auris NOT DETECTED NOT DETECTED   Candida glabrata NOT DETECTED NOT DETECTED   Candida krusei NOT DETECTED NOT DETECTED   Candida parapsilosis NOT DETECTED NOT DETECTED   Candida tropicalis NOT DETECTED NOT DETECTED   Cryptococcus neoformans/gattii NOT DETECTED NOT DETECTED   Meth resistant mecA/C and MREJ DETECTED (A) NOT DETECTED    Adriana JONETTA Bolster 07/22/2023  7:22 AM

## 2023-07-22 NOTE — Evaluation (Signed)
 Occupational Therapy Evaluation Patient Details Name: MYKAYLA BRINTON MRN: [id number] DOB: 1946-01-18 Today's Date: 07/22/2023   History of Present Illness Pt admitted for sepsis secondary to pneumonia. History includes COPD, CVA, CKD, DM, HLD, HTN.   Clinical Impression   Pt was seen for OT evaluation this date. Prior to hospital admission, pt was independent, working with PT and doing chair yoga weekly. Pt presents to acute OT demonstrating impaired ADL performance and functional mobility 2/2 decreased BLE strength and decreased activity tolerance (See OT problem list for additional functional deficits). Pt currently requires increased time/effort to complete ADL but no direct assist required. Pt educated on ECS to support ADL/mobility safety and participation including activity pacing, AE/DME. Pt verbalized understanding and denies additional OT needs at this time. Will sign off.     If plan is discharge home, recommend the following: Help with stairs or ramp for entrance    Functional Status Assessment  Patient has had a recent decline in their functional status and demonstrates the ability to make significant improvements in function in a reasonable and predictable amount of time.  Equipment Recommendations  None recommended by OT    Recommendations for Other Services       Precautions / Restrictions Precautions Precautions: Fall Restrictions Weight Bearing Restrictions Per Provider Order: No      Mobility Bed Mobility Overal bed mobility: Modified Independent                  Transfers Overall transfer level: Needs assistance Equipment used: Rolling walker (2 wheels) Transfers: Sit to/from Stand Sit to Stand: Contact guard assist                  Balance Overall balance assessment: Mild deficits observed, not formally tested                                         ADL either performed or assessed with clinical judgement   ADL Overall  ADL's : Modified independent                                             Vision         Perception         Praxis         Pertinent Vitals/Pain Pain Assessment Pain Assessment: No/denies pain     Extremity/Trunk Assessment Upper Extremity Assessment Upper Extremity Assessment: Overall WFL for tasks assessed   Lower Extremity Assessment Lower Extremity Assessment: Generalized weakness       Communication Communication Communication: No apparent difficulties   Cognition Arousal: Alert Behavior During Therapy: WFL for tasks assessed/performed Overall Cognitive Status: Within Functional Limits for tasks assessed                                       General Comments       Exercises Other Exercises Other Exercises: Pt educated on ECS to support ADL/mobility safety and participation including activity pacing, AE/DME   Shoulder Instructions      Home Living Family/patient expects to be discharged to:: Private residence Living Arrangements: Spouse/significant other;Children Available Help at Discharge: Family;Available 24 hours/day Type of Home: House Home Access:  Stairs to enter [employer] of Steps: 4-5 Entrance Stairs-Rails: Right;Left;Can reach both Home Layout: One level               Home Equipment: BSC/3in1;Shower seat - built in;Grab bars - toilet;Grab bars - tub/shower;Rollator (4 wheels);Rolling Walker (2 wheels);Hand held shower head;Adaptive equipment;Other (comment)          Prior Functioning/Environment Prior Level of Function : Independent/Modified Independent             Mobility Comments: ambulatory without AD previously. Reports 1 recent fall ADLs Comments: indep        OT Problem List: Decreased activity tolerance;Decreased strength      OT Treatment/Interventions:      OT Goals(Current goals can be found in the care plan section) Acute Rehab OT Goals Patient Stated Goal:  get back to yoga OT Goal Formulation: All assessment and education complete, DC therapy  OT Frequency:      Co-evaluation              AM-PAC OT 6 Clicks Daily Activity     Outcome Measure Help from another person eating meals?: None Help from another person taking care of personal grooming?: None Help from another person toileting, which includes using toliet, bedpan, or urinal?: None Help from another person bathing (including washing, rinsing, drying)?: None Help from another person to put on and taking off regular upper body clothing?: None Help from another person to put on and taking off regular lower body clothing?: None 6 Click Score: 24   End of Session    Activity Tolerance: Patient tolerated treatment well Patient left: in bed;with call bell/phone within reach  OT Visit Diagnosis: Other abnormalities of gait and mobility (R26.89);Muscle weakness (generalized) (M62.81)                Time: 8962-8947 OT Time Calculation (min): 15 min Charges:  OT General Charges $OT Visit: 1 Visit OT Evaluation $OT Eval Low Complexity: 1 Low  Warren SAUNDERS., MPH, MS, OTR/L ascom [phone] 07/22/23, 11:07 AM

## 2023-07-22 NOTE — Progress Notes (Signed)
 Progress Note   Patient: Mckenzie Lawrence DOB: [birth year] DOA: 07/19/2023     2 DOS: the patient was seen and examined on 07/22/2023   Brief hospital course: 78yo with h/o COPD, CVA, stage 4 CKD, SIADH, esophageal dysphagia treated with Botox  (last in 05/2023), DM, HLD, and HTN who presented on 2/1 with cough, SOB.  Outpatient CXR with PNA, treated x 1 dose with Amoxil and Doxy but worsened and so came in.  SIRS criteria on presentation with lactate to 2.0, concerning for sepsis.  Creatinine 3.2, up from baseline 2.7.  CXR with multifocal infiltrates concerning for infection vs. Aspiration.   Treated with Ceftriaxone /Azithromycin .  No dysphagia symptoms at home.   Refused solid food during SLP evaluation so will remain on CLD until patient is ready to advance diet (will need another SLP evaluation at that time).  Assessment and Plan:  Sepsis due to CAP (community acquired pneumonia) vs. Aspiration PNA with MRSA PNA SIRS criteria include low-grade temp, tachycardia, tachypnea, leukocytosis with elevated lactate suggestive of sepsis Chest x-ray concerning for x-ray aspiration vs. CAP Continue Rocephin  and azithromycin  Antitussives, incentive spirometer Wheezing this AM, add neb treatments Supplemental oxygen to keep sats over 92% Lactate has normalized Elevated procalcitonin compared to on admission Late BCID (day 3) with MRSA; changed to Vancomycin  with ID consult Echo ordered, may need TEE   Esophageal dysphagia Treated with Botox  via EGD, last treatment 05/2023 Aspiration precautions Will get SLP eval given possible aspiration pneumonia Given lack of obvious symptoms, will start CLD SLP was able to confirm that CLD is appropriate but patient and daughter refused further evaluation on admission Repeat SLP evaluation led to regular diet She will need outpatient f/u with Dr. Rosalie due to concern for persistent/silent aspiration   Acute renal failure superimposed on stage 4  chronic kidney disease  Creatinine 3.2, baseline 2.6 Avoid nephrotoxins, renally dose meds Hold losartan  IVF Nephrology notified of admission but there is no obvious need for consult currently   CAD (coronary artery disease) No complaints of chest pain Continue rosuvastatin , metoprolol , losartan  and aspirin    Hypertensive urgency 76SBP over 200 on arrival Continue home metoprolol  and amlodipine  Hold losartan  due to renal condition Hydralazine  IV as needed for additional control   OSA (obstructive sleep apnea) CPAP nightly   SIADH (syndrome of inappropriate ADH production)  Mild hyponatremia of 131 with known SIADH on presentation Resolved   COPD (chronic obstructive pulmonary disease)  Not acutely exacerbated DuoNebs as needed   Type 2 diabetes mellitus with hyperlipidemia  Not eating currently, will hold glargine Sliding scale coverage   DNR Discussed with patient/daughter and she wants to be DNR Code status changed accordingly        Consultants: SLP PT OT TOC team   Procedures: None   Antibiotics: Ceftriaxone  2/1-5 Azithromycin  2/1-5   30 Day Unplanned Readmission Risk Score    Flowsheet Row ED to Hosp-Admission (Current) from [facility]19/2023 in [facility]  30 Day Unplanned Readmission Risk Score (%) 25.04 Filed at 07/22/2023 0802       This score is the patient's risk of an unplanned readmission within 30 days of being discharged (0 -100%). The score is based on dignosis, age, lab data, medications, orders, and past utilization.   Low:  0-14.9   Medium: 15-21.9   High: 22-29.9   Extreme: 30 and above           Subjective: Feeling better daily.  Reports prior h/o MRSA wound  infection last spring requiring IV antibiotics via PICC; her daughter reports that they are unable to manage a PICC line at home and would like the same option as prior (dalbavancin weekly).   Objective: Vitals:   07/22/23 0839 07/22/23 1624   BP: (!) 189/69 (!) 183/67  Pulse: 78 81  Resp: 18 18  Temp: 98.2 F (36.8 C) 98.1 F (36.7 C)  SpO2: 98% 98%    Intake/Output Summary (Last 24 hours) at 07/22/2023 1634 Last data filed at 07/21/2023 2300 Gross per 24 hour  Intake 371 ml  Output --  Net 371 ml   Filed Weights   07/21/23 0520  Weight: 74.3 kg    Exam:  General:  Appears calm, better than yesterday, on RA  Eyes:  EOMI, normal lids, iris ENT:  grossly normal hearing, lips & tongue, mmm Neck:  no LAD, masses or thyromegaly Cardiovascular:  RRR, no m/r/g. No LE edema.  Respiratory:   CTAB.  Normal respiratory effort. Abdomen:  soft, NT, ND Skin:  no rash or induration seen on limited exam Musculoskeletal:  grossly normal tone BUE/BLE, good ROM, no bony abnormality Psychiatric:  blunted mood and affect, speech fluent and appropriate, AOx3 Neurologic:  CN 2-12 grossly intact, moves all extremities in coordinated fashion  Data Reviewed: I have reviewed the patient's lab results since admission.  Pertinent labs for today include:   Na++ 133 CO2 16 Glucose 162 BUN 56/Creatinine 2.96/GFR 16, stable WBC 6.5 Hgb 8.2 BCID with MRSA, 1 bottle on day 3/5    Family Communication: Husband and daughter were present today  Disposition: Status is: Inpatient Remains inpatient appropriate because: bacteremia     Time spent: 50 minutes  Unresulted Labs (From admission, onward)     Start     Ordered   07/23/23 0500  CBC with Differential/Platelet  Tomorrow morning,   R       Question:  Specimen collection method  Answer:  Lab=Lab collect   07/22/23 1630   07/23/23 0500  Basic metabolic panel  Tomorrow morning,   R       Question:  Specimen collection method  Answer:  Lab=Lab collect   07/22/23 1630   07/22/23 1605  Respiratory (~20 pathogens) panel by PCR  (Respiratory panel by PCR (~20 pathogens, ~24 hr TAT)  w precautions)  Once,   R        07/22/23 1604   07/22/23 1604  MRSA Next Gen by PCR, Nasal  (MRSA  Screening)  Once,   R        07/22/23 1604   07/22/23 0821  Culture, blood (Routine X 2) w Reflex to ID Panel  BLOOD CULTURE X 2,   TIMED      07/22/23 9178             Author: Delon Herald, MD 07/22/2023 4:34 PM  For on call review [email or web address].

## 2023-07-22 NOTE — TOC Initial Note (Signed)
 Transition of Care [facility]) - Initial/Assessment Note    Patient Details  Name: Mckenzie Lawrence MRN: [id number] Date of Birth: [birth year]  [facility] CM/SW Contact:    Lauraine JAYSON Carpen, LCSW Phone Number: 07/22/2023, 11:49 AM  Clinical Narrative:  Readmission prevention screen complete. CSW met with patient. No supports at bedside during initial meeting. Husband and daughter were at bedside when CSW went back to the room. CSW introduced role and explained that discharge planning would be discussed. PCP is Lynwood Null, MD. Patient drives to appointments sometimes but husband drives her if needed. Pharmacy is [facility]. No issues obtaining medications. Patient lives home with her husband. No home health prior to admission but she is agreeable. First preference was [facility] but they are not in network with her insurance. Second preference is [facility] and they have accepted referral for PT, OT, aide. Daughter had requesting an aide so OT will come out to evaluate and complete aide treatment plan.Patient has a RW, rollator, BSC, built-in shower, and SPC at home. No further concerns. CSW will continue to follow patient for support and facilitate return home once stable.                Expected Discharge Plan: Home w Home Health Services Barriers to Discharge: Continued Medical Work up   Patient Goals and CMS Choice   CMS Medicare.gov Compare Post Acute Care list provided to:: Patient Choice offered to / list presented to : Patient      Expected Discharge Plan and Services     Post Acute Care Choice: Home Health Living arrangements for the past 2 months: Single Family Home                           HH Arranged: PT, OT, Nurse's Aide HH Agency: [facility] Date [facility] Agency Contacted: 07/22/23   Representative spoke with at [facility] Agency: Harlene  Prior Living Arrangements/Services Living arrangements for the past 2 months: Single Family Home Lives with:: Spouse Patient  language and need for interpreter reviewed:: Yes Do you feel safe going back to the place where you live?: Yes      Need for Family Participation in Patient Care: Yes (Comment) Care giver support system in place?: Yes (comment) Current home services: DME Criminal Activity/Legal Involvement Pertinent to Current Situation/Hospitalization: No - Comment as needed  Activities of Daily Living   ADL Screening (condition at time of admission) Independently performs ADLs?: Yes (appropriate for developmental age) Is the patient deaf or have difficulty hearing?: No Does the patient have difficulty seeing, even when wearing glasses/contacts?: No Does the patient have difficulty concentrating, remembering, or making decisions?: No  Permission Sought/Granted Permission sought to share information with : Facility Medical Sales Representative, Family Supports Permission granted to share information with : Yes, Verbal Permission Granted  Share Information with NAME: Mckenley Birenbaum and [name] Bowers  Permission granted to share info w AGENCY: Home Health Agencies  Permission granted to share info w Relationship: Husband and daughter  Permission granted to share info w Contact Information: [place]:  [phone]: [phone]  Emotional Assessment Appearance:: Appears stated age Attitude/Demeanor/Rapport: Engaged, Gracious Affect (typically observed): Accepting, Appropriate, Calm, Pleasant Orientation: : Oriented to Self, Oriented to Place, Oriented to  Time, Oriented to Situation Alcohol / Substance Use: Not Applicable Psych Involvement: No (comment)  Admission diagnosis:  CAP (community acquired pneumonia) [J18.9] AKI (acute kidney injury) (HCC) [N17.9] Sepsis due to pneumonia [facility]) [  J18.9, A41.9] Pneumonia due to infectious organism, unspecified laterality, unspecified part of lung [J18.9] Sepsis, due to unspecified organism, unspecified whether acute organ dysfunction present [facility])  [A41.9] Patient Active Problem List   Diagnosis Date Noted   Sepsis due to pneumonia (HCC) 07/20/2023   Multifocal pneumonia 07/19/2023   Acute renal failure superimposed on stage 4 chronic kidney disease (HCC) 07/19/2023   SIADH (syndrome of inappropriate ADH production) (HCC) 07/19/2023   SIRS (systemic inflammatory response syndrome) (HCC) 07/19/2023   Hypertensive urgency 07/19/2023   COPD (chronic obstructive pulmonary disease) (HCC)    Lumbar surgical wound fluid collection 08/13/2022   Type 2 diabetes mellitus with other specified complication (HCC) 08/09/2022   Stage 4 chronic kidney disease (HCC) 08/09/2022   Wound infection complicating hardware (HCC) 08/08/2022   S/P lumbar spinal fusion 07/08/2022   Recurrent displacement of lumbar disc 05/20/2022   Acute cystitis without hematuria    Acute pancreatitis 11/10/2021   Acute lower UTI 11/10/2021   Diabetes mellitus with renal manifestations, controlled (HCC) 11/10/2021   CKD (chronic kidney disease) stage 4, GFR 15-29 ml/min (HCC) 11/10/2021   Lumbar spinal stenosis 10/01/2021   Esophageal spasm 08/10/2021   Esophageal dysphagia 06/28/2021   Heart palpitations 10/12/2020   Situational anxiety 07/08/2019   Chronic kidney disease, stage 3 unspecified (HCC) 03/01/2019   Edema of lower extremity 03/01/2019   Proteinuria 03/01/2019   Encounter for orthopedic follow-up care 11/04/2018   CAD (coronary artery disease) 10/26/2018   Pain in right hand 07/22/2018   Degeneration of lumbar intervertebral disc 02/06/2018   Scoliosis deformity of spine 02/06/2018   Spondylolisthesis, grade 1 02/06/2018   Low back pain 02/04/2018   Spinal stenosis of lumbar region 02/04/2018   Trigger finger 01/26/2018   CVA (cerebral vascular accident) (HCC) 03/25/2016   OSA (obstructive sleep apnea) 02/15/2016   Hyponatremia 09/25/2015   Vitamin B12 deficiency 09/12/2015   Lacunar stroke of left subthalamic region [facility]) 09/12/2015   Diabetic  polyneuropathy associated with diabetes mellitus due to underlying condition (HCC) 09/12/2015   Stroke (HCC) 03/15/2015   Accelerated hypertension 03/15/2015   Vertigo 09/24/2013   Female stress incontinence 02/10/2013   Incomplete emptying of bladder 02/10/2013   Increased frequency of urination 02/10/2013   Urge incontinence 02/10/2013   Syncope 12/04/2011   Tobacco abuse 12/04/2011   Elevated cholesterol    Cataracts, bilateral    Hypertension    Broken foot    PCOS (polycystic ovarian syndrome)    Type 2 diabetes mellitus with hyperlipidemia (HCC)    Osteopenia    PCP:  [name], MD Pharmacy:   [facility] - [place], [place] - [address] [place] [address] Phone: [phone] Fax: [phone]  [facility] [address] [place] [address] Phone: [phone] Fax: [phone]     Social Drivers of Health (SDOH) Social History: SDOH Screenings   Food Insecurity: No Food Insecurity (07/20/2023)  Housing: Unknown (07/20/2023)  Transportation Needs: No Transportation Needs (07/20/2023)  Utilities: Not At Risk (07/20/2023)  Depression (PHQ2-9): Low Risk  (04/03/2023)  Financial Resource Strain: Low Risk  (02/10/2023)   Received from [facility]  Social Connections: Unknown (07/21/2023)  Tobacco Use: High Risk (07/19/2023)   SDOH Interventions:     Readmission Risk Interventions    07/22/2023   10:56 AM 08/13/2022    1:04 PM 11/10/2021    2:49 PM  Readmission Risk Prevention Plan  Transportation Screening Complete Complete  Complete  PCP or Specialist Appt within 5-7 Days  Complete   PCP or Specialist Appt within 3-5 Days Complete  Complete  Home Care Screening  Complete   Medication Review (RN CM)  Complete   HRI or Home Care Consult Complete  Complete  Social Work Consult for Recovery Care Planning/Counseling Complete  Complete  Palliative Care Screening Not Applicable  Not  Applicable  Medication Review Oceanographer) Complete  Complete

## 2023-07-22 NOTE — Progress Notes (Signed)
 Pharmacy Antibiotic Note  Mckenzie Lawrence is a 78 y.o. female admitted on 07/19/2023 with bacteremia.  Pharmacy has been consulted for vancomycin  dosing. Patient presents 2/1 with fever and cough. Prior to presentation to [facility] ED, patient went to urgent care, CXR performed and was diagnosed with pneumonia.  Patient given amoxicillin + doxycyline.  Since admission to hospital, she has been on ceftriaxone  + azithromycin . Last Feb, patient had I&D of lumbar wound and culture grew MRSA.  She has PMH of neurosurgery 10/01/2021, 05/2022 and 06/2022  Today, 07/22/2023 Day #1 vancomycin  and day #4 ceftriaxone /azithromycin  Renal: SCr = 2.96 - PMH CKD (baseline SCr ~2.6) WBC WNL Afebrile Respiratory virus panel: neg 2/1 blood cultures: 1/4 GPC, BCID MRSA Took ~2.5 days for culture to become positive  Plan: Vancomycin  1500mg  IV x 1 as loading dose Will follow-up with SCr in 2/5 prior to considering maintenance vancomycin  dosing.  May check random vancomycin  level 2/6 with morning labs to decide if appropriate to redose.   Follow renal function Check blood cultures prior to starting vancomycin  ID consult   Height: 5' 7 (170.2 cm) Weight: 74.3 kg (163 lb 12.8 oz) IBW/kg (Calculated) : 61.6  Temp (24hrs), Avg:98.3 F (36.8 C), Min:97.9 F (36.6 C), Max:98.9 F (37.2 C)  Recent Labs  Lab 07/19/23 2040 07/19/23 2049 07/19/23 2209 07/20/23 1004 07/21/23 0409 07/22/23 0505  WBC 12.9*  --   --  7.7 7.7 6.5  CREATININE 3.20*  --   --  3.01* 3.15* 2.96*  LATICACIDVEN  --  1.8 2.0* 1.3  --   --     Estimated Creatinine Clearance: 17.3 mL/min (A) (by C-G formula based on SCr of 2.96 mg/dL (H)).    Allergies  Allergen Reactions   Iodine Anaphylaxis and Other (See Comments)    Pt states that she is allergic to ingested iodine only, okay for betadine.     Iodine I 131 Tositumomab Anaphylaxis   Shellfish Allergy Anaphylaxis   Codeine Nausea And Vomiting   Morphine  Sulfate Nausea And Vomiting    Irbesartan Other (See Comments)     Unknown  (Avapro)   Sulfa Antibiotics Other (See Comments)    Fever      Thank you for allowing pharmacy to be a part of this patient's care.  Tira Lafferty, PharmD, BCPS, BCIDP Work Cell: [phone] 07/22/2023 8:41 AM

## 2023-07-23 ENCOUNTER — Inpatient Hospital Stay (HOSPITAL_COMMUNITY)
Admit: 2023-07-23 | Discharge: 2023-07-23 | Disposition: A | Discharge status: Home / Self Care | Payer: PPO | Attending: Internal Medicine | Admitting: Internal Medicine

## 2023-07-23 DIAGNOSIS — R1319 Other dysphagia: Secondary | ICD-10-CM | POA: Diagnosis not present

## 2023-07-23 DIAGNOSIS — E1169 Type 2 diabetes mellitus with other specified complication: Secondary | ICD-10-CM

## 2023-07-23 DIAGNOSIS — J189 Pneumonia, unspecified organism: Secondary | ICD-10-CM | POA: Diagnosis not present

## 2023-07-23 DIAGNOSIS — G4733 Obstructive sleep apnea (adult) (pediatric): Secondary | ICD-10-CM

## 2023-07-23 DIAGNOSIS — N184 Chronic kidney disease, stage 4 (severe): Secondary | ICD-10-CM

## 2023-07-23 DIAGNOSIS — I160 Hypertensive urgency: Secondary | ICD-10-CM

## 2023-07-23 DIAGNOSIS — R7881 Bacteremia: Secondary | ICD-10-CM

## 2023-07-23 DIAGNOSIS — B9562 Methicillin resistant Staphylococcus aureus infection as the cause of diseases classified elsewhere: Secondary | ICD-10-CM | POA: Diagnosis not present

## 2023-07-23 DIAGNOSIS — E222 Syndrome of inappropriate secretion of antidiuretic hormone: Secondary | ICD-10-CM

## 2023-07-23 DIAGNOSIS — F17210 Nicotine dependence, cigarettes, uncomplicated: Secondary | ICD-10-CM | POA: Diagnosis not present

## 2023-07-23 DIAGNOSIS — E785 Hyperlipidemia, unspecified: Secondary | ICD-10-CM

## 2023-07-23 DIAGNOSIS — I2510 Atherosclerotic heart disease of native coronary artery without angina pectoris: Secondary | ICD-10-CM

## 2023-07-23 DIAGNOSIS — A4102 Sepsis due to Methicillin resistant Staphylococcus aureus: Secondary | ICD-10-CM

## 2023-07-23 DIAGNOSIS — N179 Acute kidney failure, unspecified: Principal | ICD-10-CM

## 2023-07-23 LAB — CBC WITH DIFFERENTIAL/PLATELET
Abs Immature Granulocytes: 0.1 10*3/uL — ABNORMAL HIGH (ref 0.00–0.07)
Basophils Absolute: 0 10*3/uL (ref 0.0–0.1)
Basophils Relative: 0 %
Eosinophils Absolute: 0.3 10*3/uL (ref 0.0–0.5)
Eosinophils Relative: 4 %
HCT: 24.3 % — ABNORMAL LOW (ref 36.0–46.0)
Hemoglobin: 8 g/dL — ABNORMAL LOW (ref 12.0–15.0)
Immature Granulocytes: 2 %
Lymphocytes Relative: 16 %
Lymphs Abs: 1.1 10*3/uL (ref 0.7–4.0)
MCH: 31.5 pg (ref 26.0–34.0)
MCHC: 32.9 g/dL (ref 30.0–36.0)
MCV: 95.7 fL (ref 80.0–100.0)
Monocytes Absolute: 0.7 10*3/uL (ref 0.1–1.0)
Monocytes Relative: 11 %
Neutro Abs: 4.6 10*3/uL (ref 1.7–7.7)
Neutrophils Relative %: 67 %
Platelets: 228 10*3/uL (ref 150–400)
RBC: 2.54 MIL/uL — ABNORMAL LOW (ref 3.87–5.11)
RDW: 13.7 % (ref 11.5–15.5)
WBC: 6.9 10*3/uL (ref 4.0–10.5)
nRBC: 0 % (ref 0.0–0.2)

## 2023-07-23 LAB — IRON AND TIBC
Iron: 43 ug/dL (ref 28–170)
Saturation Ratios: 22 % (ref 10.4–31.8)
TIBC: 196 ug/dL — ABNORMAL LOW (ref 250–450)
UIBC: 153 ug/dL

## 2023-07-23 LAB — BASIC METABOLIC PANEL
Anion gap: 10 (ref 5–15)
BUN: 53 mg/dL — ABNORMAL HIGH (ref 8–23)
CO2: 16 mmol/L — ABNORMAL LOW (ref 22–32)
Calcium: 8.8 mg/dL — ABNORMAL LOW (ref 8.9–10.3)
Chloride: 107 mmol/L (ref 98–111)
Creatinine, Ser: 2.83 mg/dL — ABNORMAL HIGH (ref 0.44–1.00)
GFR, Estimated: 17 mL/min — ABNORMAL LOW (ref >60)
Glucose, Bld: 123 mg/dL — ABNORMAL HIGH (ref 70–99)
Potassium: 4 mmol/L (ref 3.5–5.1)
Sodium: 133 mmol/L — ABNORMAL LOW (ref 135–145)

## 2023-07-23 LAB — ECHOCARDIOGRAM COMPLETE
AR max vel: 1.7 cm2
AV Area VTI: 1.6 cm2
AV Area mean vel: 1.73 cm2
AV Mean grad: 4 mm[Hg]
AV Peak grad: 8 mm[Hg]
Ao pk vel: 1.42 m/s
Area-P 1/2: 4.96 cm2
Height: 67 in
MV VTI: 1.52 cm2
S' Lateral: 3.8 cm
Weight: 2620.83 [oz_av]

## 2023-07-23 LAB — RESPIRATORY PANEL BY PCR

## 2023-07-23 LAB — RETICULOCYTES
Immature Retic Fract: 26.3 % — ABNORMAL HIGH (ref 2.3–15.9)
RBC.: 2.55 MIL/uL — ABNORMAL LOW (ref 3.87–5.11)
Retic Count, Absolute: 40.5 10*3/uL (ref 19.0–186.0)
Retic Ct Pct: 1.6 % (ref 0.4–3.1)

## 2023-07-23 LAB — FOLATE: Folate: 9.2 ng/mL (ref >5.9)

## 2023-07-23 LAB — GLUCOSE, CAPILLARY
Glucose-Capillary: 129 mg/dL — ABNORMAL HIGH (ref 70–99)
Glucose-Capillary: 229 mg/dL — ABNORMAL HIGH (ref 70–99)
Glucose-Capillary: 153 mg/dL — ABNORMAL HIGH (ref 70–99)
Glucose-Capillary: 180 mg/dL — ABNORMAL HIGH (ref 70–99)

## 2023-07-23 LAB — FERRITIN: Ferritin: 184 ng/mL (ref 11–307)

## 2023-07-23 MED ORDER — LOSARTAN POTASSIUM 50 MG PO TABS
100.0000 mg | ORAL_TABLET | Freq: Every day | ORAL | Status: DC
  Administered 2023-07-23 – 2023-07-25 (×3): 100 mg via ORAL
  Filled 2023-07-23 (×3): qty 2

## 2023-07-23 MED ORDER — AMLODIPINE BESYLATE 10 MG PO TABS
10.0000 mg | ORAL_TABLET | Freq: Every day | ORAL | Status: DC
  Administered 2023-07-24 – 2023-07-26 (×3): 10 mg via ORAL
  Filled 2023-07-23 (×3): qty 1

## 2023-07-23 MED ORDER — NYSTATIN 100000 UNIT/ML MT SUSP
5.0000 mL | Freq: Four times a day (QID) | OROMUCOSAL | Status: DC
  Administered 2023-07-23 – 2023-07-26 (×11): 500000 [IU] via ORAL
  Filled 2023-07-23 (×11): qty 5

## 2023-07-23 NOTE — Progress Notes (Signed)
 Physical Therapy Treatment Patient Details Name: Mckenzie Lawrence MRN: [id number] DOB: [birth year] Today's Date: 07/23/2023   History of Present Illness Pt admitted for sepsis secondary to pneumonia. History includes COPD, CVA, CKD, DM, HLD, HTN.    PT Comments  Pt is making good progress towards goals with ability to ambulate in hallway using RW. Good speed and slight fatigue with exertion. Able to ambulate in bathroom and perform self hygiene along with handwashing. Is approaching baseline. Encouraged further mobility with RN staff and will engage mobility specialists. Will continue to progress.   If plan is discharge home, recommend the following: A little help with walking and/or transfers;A little help with bathing/dressing/bathroom;Help with stairs or ramp for entrance   Can travel by private vehicle        Equipment Recommendations  None recommended by PT    Recommendations for Other Services       Precautions / Restrictions Precautions Precautions: Fall Restrictions Weight Bearing Restrictions Per Provider Order: No     Mobility  Bed Mobility Overal bed mobility: Modified Independent             General bed mobility comments: safe technique. Ease of transition    Transfers Overall transfer level: Needs assistance Equipment used: Rolling walker (2 wheels) Transfers: Sit to/from Stand Sit to Stand: Supervision           General transfer comment: safe technique with upright posture    Ambulation/Gait Ambulation/Gait assistance: Supervision Gait Distance (Feet): 200 Feet Assistive device: Rolling walker (2 wheels) Gait Pattern/deviations: Step-through pattern       General Gait Details: ambulated around nursing station with RW. Safe technique with good speed   Stairs             Wheelchair Mobility     Tilt Bed    Modified Rankin (Stroke Patients Only)       Balance Overall balance assessment: Mild deficits observed, not formally  tested                                          Cognition Arousal: Alert Behavior During Therapy: WFL for tasks assessed/performed Overall Cognitive Status: Within Functional Limits for tasks assessed                                 General Comments: pleasant and agreeable to session        Exercises Other Exercises Other Exercises: Pt ambulated to bathroom with supervision. Pt has difficulty rising from low toliet. Cues for safety    General Comments        Pertinent Vitals/Pain Pain Assessment Pain Assessment: No/denies pain Pain Intervention(s): Limited activity within patient's tolerance    Home Living                          Prior Function            PT Goals (current goals can now be found in the care plan section) Acute Rehab PT Goals Patient Stated Goal: to go home PT Goal Formulation: With patient Time For Goal Achievement: 08/04/23 Potential to Achieve Goals: Good Progress towards PT goals: Progressing toward goals    Frequency    Min 1X/week      PT Plan      Co-evaluation  AM-PAC PT 6 Clicks Mobility   Outcome Measure  Help needed turning from your back to your side while in a flat bed without using bedrails?: None Help needed moving from lying on your back to sitting on the side of a flat bed without using bedrails?: None Help needed moving to and from a bed to a chair (including a wheelchair)?: A Little Help needed standing up from a chair using your arms (e.g., wheelchair or bedside chair)?: A Little Help needed to walk in hospital room?: A Little Help needed climbing 3-5 steps with a railing? : A Lot 6 Click Score: 19    End of Session   Activity Tolerance: Patient tolerated treatment well Patient left: in bed (pt mod fall risk and not impulsive. up ad lib in room) Nurse Communication: Mobility status PT Visit Diagnosis: Muscle weakness (generalized) (M62.81);Difficulty  in walking, not elsewhere classified (R26.2);Unsteadiness on feet (R26.81)     Time: 0950-1010 PT Time Calculation (min) (ACUTE ONLY): 20 min  Charges:    $Gait Training: 8-22 mins PT General Charges $$ ACUTE PT VISIT: 1 Visit                     [name], PT, DPT, GCS [phone]    [name] 07/23/2023, 10:29 AM

## 2023-07-23 NOTE — Progress Notes (Signed)
 Pharmacy Antibiotic Note  Mckenzie Lawrence is a 78 y.o. female admitted on 07/19/2023 with bacteremia.  Pharmacy has been consulted for vancomycin  dosing. Patient presents 2/1 with fever and cough. Prior to presentation to [facility] ED, patient went to urgent care, CXR performed and was diagnosed with pneumonia.  Patient given amoxicillin + doxycyline.  Since admission to hospital, she has been on ceftriaxone  + azithromycin . Last Feb, patient had I&D of lumbar wound and culture grew MRSA.  She has PMH of neurosurgery 10/01/2021, 05/2022 and 06/2022  Today, 07/23/2023 Day #2 vancomycin  and day #5 of 5 ceftriaxone /azithromycin  Renal: SCr = 2.83 - PMH CKD (baseline SCr ~2.6) WBC WNL Afebrile Respiratory virus panel: neg MRSA PCR neg 2/1 blood cultures: 3/4 GPC, BCID MRSA Took ~2.5 days for culture to become positive 2/4 blood cultures: NGTD 2/5 ECHO - no vegetation  Vancomycin  dosing/levels:  2/4 vancomycin  1500mg  IV x 1 given at 0943  Plan: Vancomycin  1500mg  IV x 1 as loading dose given 2/4,  will check a random vancomycin  level tomorrow morning, 2/6 .   Follow renal function Check blood cultures prior to starting vancomycin  Once susceptibliies known, plan to send for daptomycin  MIC at [facility] ID consult   Height: 5' 7 (170.2 cm) Weight: 74.3 kg (163 lb 12.8 oz) IBW/kg (Calculated) : 61.6  Temp (24hrs), Avg:98.5 F (36.9 C), Min:98.1 F (36.7 C), Max:98.9 F (37.2 C)  Recent Labs  Lab 07/19/23 2040 07/19/23 2049 07/19/23 2209 07/20/23 1004 07/21/23 0409 07/22/23 0505 07/23/23 0614  WBC 12.9*  --   --  7.7 7.7 6.5 6.9  CREATININE 3.20*  --   --  3.01* 3.15* 2.96* 2.83*  LATICACIDVEN  --  1.8 2.0* 1.3  --   --   --     Estimated Creatinine Clearance: 18.1 mL/min (A) (by C-G formula based on SCr of 2.83 mg/dL (H)).    Allergies  Allergen Reactions   Iodine Anaphylaxis and Other (See Comments)    Pt states that she is allergic to ingested iodine only, okay for betadine.     Iodine  I 131 Tositumomab Anaphylaxis   Shellfish Allergy Anaphylaxis   Codeine Nausea And Vomiting   Morphine  Sulfate Nausea And Vomiting   Irbesartan Other (See Comments)     Unknown  (Avapro)   Sulfa Antibiotics Other (See Comments)    Fever      Thank you for allowing pharmacy to be a part of this patient's care.  Sylvester Salonga, PharmD, BCPS, BCIDP Work Cell: [phone] 07/23/2023 1:03 PM

## 2023-07-23 NOTE — Plan of Care (Signed)

## 2023-07-23 NOTE — Progress Notes (Signed)
*  PRELIMINARY RESULTS* Echocardiogram 2D Echocardiogram has been performed.  Cristela Blue 07/23/2023, 8:51 AM

## 2023-07-23 NOTE — Progress Notes (Signed)
 Date of Admission:  07/19/2023      ID: Mckenzie Lawrence is a 78 y.o. female  Principal Problem:   Sepsis due to pneumonia [facility]) Active Problems:   Type 2 diabetes mellitus with hyperlipidemia (HCC)   OSA (obstructive sleep apnea)   CAD (coronary artery disease)   Esophageal dysphagia   Multifocal pneumonia   COPD (chronic obstructive pulmonary disease) (HCC)   Acute renal failure superimposed on stage 4 chronic kidney disease (HCC)   SIADH (syndrome of inappropriate ADH production) (HCC)   Hypertensive urgency    Subjective: Pt is feeling better Cough present  Medications:   amLODipine   5 mg Oral Daily   aspirin  EC  81 mg Oral QHS   guaiFENesin   600 mg Oral BID   heparin  injection (subcutaneous)  5,000 Units Subcutaneous Q8H   insulin  aspart  0-15 Units Subcutaneous TID WC   insulin  aspart  0-5 Units Subcutaneous QHS   metoprolol  succinate  100 mg Oral QHS   rosuvastatin   5 mg Oral Daily   vancomycin  variable dose per unstable renal function (pharmacist dosing)   Does not apply See admin instructions    Objective: Vital signs in last 24 hours: Patient Vitals for the past 24 hrs:  BP Temp Temp src Pulse Resp SpO2  07/23/23 0814 (!) 172/75 98.5 F (36.9 C) Oral 79 18 99 %  07/23/23 0433 (!) 172/70 98.3 F (36.8 C) Oral 73 18 97 %  07/22/23 2049 (!) 175/63 98.9 F (37.2 C) Oral 82 18 98 %  07/22/23 1624 (!) 183/67 98.1 F (36.7 C) Oral 81 18 98 %      PHYSICAL EXAM:  General: Alert, cooperative, no distress, appears stated age.  Head: Normocephalic, without obvious abnormality, atraumatic. Eyes: Conjunctivae clear, anicteric sclerae. Pupils are equal ENT Nares normal. No drainage or sinus tenderness. Lips, mucosa, and tongue normal. No Thrush Neck: Supple, symmetrical, no adenopathy, thyroid: non tender no carotid bruit and no JVD. Back: No CVA tenderness. Lungs: Clear to auscultation bilaterally. No Wheezing or Rhonchi. No rales. Heart: Regular rate and  rhythm, no murmur, rub or gallop. Abdomen: Soft, non-tender,not distended. Bowel sounds normal. No masses Extremities: atraumatic, no cyanosis. No edema. No clubbing Skin: No rashes or lesions. Or bruising Lymph: Cervical, supraclavicular normal. Neurologic: Grossly non-focal  Lab Results    Latest Ref Rng & Units 07/23/2023    6:14 AM 07/22/2023    5:05 AM 07/21/2023    4:09 AM  CBC  WBC 4.0 - 10.5 K/uL 6.9  6.5  7.7   Hemoglobin 12.0 - 15.0 g/dL 8.0  8.2  8.4   Hematocrit 36.0 - 46.0 % 24.3  23.8  25.8   Platelets 150 - 400 K/uL 228  187  193        Latest Ref Rng & Units 07/23/2023    6:14 AM 07/22/2023    5:05 AM 07/21/2023    4:09 AM  CMP  Glucose 70 - 99 mg/dL 876  837  828   BUN 8 - 23 mg/dL 53  56  59   Creatinine 0.44 - 1.00 mg/dL 7.16  7.03  6.84   Sodium 135 - 145 mmol/L 133  133  135   Potassium 3.5 - 5.1 mmol/L 4.0  3.9  4.1   Chloride 98 - 111 mmol/L 107  107  107   CO2 22 - 32 mmol/L 16  16  19    Calcium  8.9 - 10.3 mg/dL 8.8  8.7  8.7  Microbiology: [facility] MRSA 3/4  Studies/Results: ECHOCARDIOGRAM COMPLETE Result Date: 07/23/2023    ECHOCARDIOGRAM REPORT   Patient Name:   Mckenzie Lawrence Date of Exam: 07/23/2023 Medical Rec #:  [id number]      Height:       67.0 in Accession #:    [id number]     Weight:       163.8 lb Date of Birth:  [birth year]      BSA:          1.858 m Patient Age:    77 years       BP:           172/70 mmHg Patient Gender: F              HR:           73 bpm. Exam Location:  [facility] Procedure: 2D Echo, Cardiac Doppler and Color Doppler Indications:     Bacteremia R78.81  History:         Patient has prior history of Echocardiogram examinations, most                  recent 03/16/2015. COPD; Risk Factors:Dyslipidemia and                  Hypertension.  Sonographer:     Christopher Furnace Referring Phys:  [id number] JENNIFER YATES Diagnosing Phys: Timothy Gollan MD IMPRESSIONS  1. Left ventricular ejection fraction, by estimation, is 55 to 60%. Left ventricular ejection  fraction by PLAX is 54 %. The left ventricle has normal function. The left ventricle has no regional wall motion abnormalities. Left ventricular diastolic parameters are indeterminate.  2. Right ventricular systolic function is normal. The right ventricular size is normal.  3. The mitral valve is normal in structure. Mild mitral valve regurgitation. No evidence of mitral stenosis.  4. The aortic valve is normal in structure. Aortic valve regurgitation is not visualized. No aortic stenosis is present.  5. The inferior vena cava is normal in size with greater than 50% respiratory variability, suggesting right atrial pressure of 3 mmHg. FINDINGS  Left Ventricle: Left ventricular ejection fraction, by estimation, is 55 to 60%. Left ventricular ejection fraction by PLAX is 54 %. The left ventricle has normal function. The left ventricle has no regional wall motion abnormalities. The left ventricular internal cavity size was normal in size. There is no left ventricular hypertrophy. Left ventricular diastolic parameters are indeterminate. Right Ventricle: The right ventricular size is normal. No increase in right ventricular wall thickness. Right ventricular systolic function is normal. Left Atrium: Left atrial size was normal in size. Right Atrium: Right atrial size was normal in size. Pericardium: There is no evidence of pericardial effusion. Mitral Valve: The mitral valve is normal in structure. Mild mitral valve regurgitation. No evidence of mitral valve stenosis. MV peak gradient, 7.5 mmHg. The mean mitral valve gradient is 3.0 mmHg. There is no evidence of mitral valve vegetation. Tricuspid Valve: The tricuspid valve is normal in structure. Tricuspid valve regurgitation is mild . No evidence of tricuspid stenosis. Aortic Valve: The aortic valve is normal in structure. Aortic valve regurgitation is not visualized. No aortic stenosis is present. Aortic valve mean gradient measures 4.0 mmHg. Aortic valve peak gradient  measures 8.0 mmHg. Aortic valve area, by VTI measures 1.60 cm. Pulmonic Valve: The pulmonic valve was normal in structure. Pulmonic valve regurgitation is not visualized. No evidence of pulmonic stenosis. Aorta: The aortic root is normal in size and structure.  Venous: The inferior vena cava is normal in size with greater than 50% respiratory variability, suggesting right atrial pressure of 3 mmHg. IAS/Shunts: No atrial level shunt detected by color flow Doppler.  LEFT VENTRICLE PLAX 2D LV EF:         Left            Diastology                ventricular     LV e' medial:    5.55 cm/s                ejection        LV E/e' medial:  23.4                fraction by     LV e' lateral:   13.50 cm/s                PLAX is 54      LV E/e' lateral: 9.6                %. LVIDd:         5.30 cm LVIDs:         3.80 cm LV PW:         0.90 cm LV IVS:        0.90 cm LVOT diam:     2.00 cm LV SV:         53 LV SV Index:   28 LVOT Area:     3.14 cm  RIGHT VENTRICLE RV Basal diam:  3.20 cm RV Mid diam:    2.50 cm RV S prime:     14.10 cm/s TAPSE (M-mode): 3.1 cm LEFT ATRIUM         Index       RIGHT ATRIUM           Index LA diam:    3.80 cm 2.05 cm/m  RA Area:     17.60 cm                                 RA Volume:   46.30 ml  24.92 ml/m  AORTIC VALVE AV Area (Vmax):    1.70 cm AV Area (Vmean):   1.73 cm AV Area (VTI):     1.60 cm AV Vmax:           141.50 cm/s AV Vmean:          97.300 cm/s AV VTI:            0.330 m AV Peak Grad:      8.0 mmHg AV Mean Grad:      4.0 mmHg LVOT Vmax:         76.60 cm/s LVOT Vmean:        53.600 cm/s LVOT VTI:          0.168 m LVOT/AV VTI ratio: 0.51  AORTA Ao Root diam: 2.70 cm MITRAL VALVE                TRICUSPID VALVE MV Area (PHT): 4.96 cm     TR Peak grad:   24.0 mmHg MV Area VTI:   1.52 cm     TR Vmax:        245.00 cm/s MV Peak grad:  7.5 mmHg MV Mean grad:  3.0 mmHg     SHUNTS MV Vmax:  1.37 m/s     Systemic VTI:  0.17 m MV Vmean:      85.4 cm/s    Systemic Diam: 2.00 cm MV  Decel Time: 153 msec MV E velocity: 130.00 cm/s MV A velocity: 92.80 cm/s MV E/A ratio:  1.40 Evalene Lunger MD Electronically signed by Evalene Lunger MD Signature Date/Time: 07/23/2023/11:26:53 AM    Final      Assessment/Plan: Patient presenting with cough, fever, shortness of breath of 3 days duration   Community-acquired pneumonia on azithromycin  and ceftriaxone - check sputum culture May stop azithromycin  and ceftriaxone  in 2 days chest x-ray showed bilateral interstitial infiltrates more on the right side COVID, flu, RSV negative Will do respiratory viral PCR panel NEg May need CT scan of the chest   MRSA bacteremia 3 out of 4 bottle Became positive after 48 hours so low bioburden l repeat blood culture She is  on vancomycin  Monitor closely because of CKD. MRSA nares neg    2D echo OK Need TEE  She has a history past history of MRSA lumbar spine infection.  Will get MRI spine   COPD   History of stage I non-small cell carcinoma of the left upper lobe status post SBRT   AKI on CKD   Anemia   H/o lumbar spine surgeries H/o MRSA infection post surgery in Feb 2024   Diabetes mellitus on insulin   Discussed the management with the patient and her daughter and Dr.Amin

## 2023-07-23 NOTE — TOC Progression Note (Signed)
 Transition of Care [facility]) - Progression Note    Patient Details  Name: Mckenzie Lawrence MRN: [id number] Date of Birth: [birth year]  Transition of [facility]) CM/SW Contact  Lauraine JAYSON Carpen, LCSW Phone Number: 07/23/2023, 12:12 PM  Clinical Narrative: CSW updated patient and husband regarding home health.    Expected Discharge Plan: Home w Home Health Services Barriers to Discharge: Continued Medical Work up  Expected Discharge Plan and Services     Post Acute Care Choice: Home Health Living arrangements for the past 2 months: Single Family Home                           HH Arranged: PT, OT, Nurse's Aide HH Agency: [facility] Agency Contacted: 07/22/23   Representative spoke with at [facility] Agency: Harlene   Social Determinants of Health (SDOH) Interventions SDOH Screenings   Food Insecurity: No Food Insecurity (07/20/2023)  Housing: Unknown (07/20/2023)  Transportation Needs: No Transportation Needs (07/20/2023)  Utilities: Not At Risk (07/20/2023)  Depression (PHQ2-9): Low Risk  (04/03/2023)  Financial Resource Strain: Low Risk  (02/10/2023)   Received from [facility]  Social Connections: Unknown (07/21/2023)  Tobacco Use: High Risk (07/19/2023)    Readmission Risk Interventions    07/22/2023   10:56 AM 08/13/2022    1:04 PM 11/10/2021    2:49 PM  Readmission Risk Prevention Plan  Transportation Screening Complete Complete Complete  PCP or Specialist Appt within 5-7 Days  Complete   PCP or Specialist Appt within 3-5 Days Complete  Complete  Home Care Screening  Complete   Medication Review (RN CM)  Complete   HRI or Home Care Consult Complete  Complete  Social Work Consult for Recovery Care Planning/Counseling Complete  Complete  Palliative Care Screening Not Applicable  Not Applicable  Medication Review Oceanographer) Complete  Complete

## 2023-07-23 NOTE — Progress Notes (Signed)
 Progress Note   Patient: Mckenzie Lawrence FMW:[id number] DOB: Jul 30, 1945 DOA: 07/19/2023     3 DOS: the patient was seen and examined on 07/23/2023   Brief hospital course: 77yo with h/o COPD, CVA, stage 4 CKD, SIADH, esophageal dysphagia treated with Botox  (last in 05/2023), DM, HLD, and HTN who presented on 2/1 with cough, SOB.  Outpatient CXR with PNA, treated x 1 dose with Amoxil and Doxy but worsened and so came in.  SIRS criteria on presentation with lactate to 2.0, concerning for sepsis.  Creatinine 3.2, up from baseline 2.7.  CXR with multifocal infiltrates concerning for infection vs. Aspiration.   Treated with Ceftriaxone /Azithromycin .  No dysphagia symptoms at home.   Refused solid food during SLP evaluation so will remain on CLD until patient is ready to advance diet (will need another SLP evaluation at that time).  2/5: 3/4 blood culture bottles with MRSA.  Patient has complicated prior history of MRSA infection involving spine, summarized as below by infectious disease.lumbar L4-L5 and L5-S1 laminotomy/foraminotomies and medial facetectomy and posterior lumbar interbody fusion in April 2023, left L3-L4 redo intervertebral discectomy in December 2023, and then January 2024 underwent L3-L4 fusion surgery with instrumentation,Which was complicated by wound drainage and was taken back to the OR on 08/08/2022 for I&D.  Infection was did not track up to the deep deep tissue and did not involve the hardware.  Culture was positive for MRSA.  She was seen by infectious disease at that time and as daughter did not want her to have IV antibiotics at home she received weekly long-acting antibiotic which was dalbavancin into 3 doses and 1 dose of oritavancin .  She was seen by Dr. Prentiss as outpatient on 09/04/2022 and was doing well with complete healing of the lumbar wound.    In August 2024 she had a PET scan for a new left upper lobe nodule which was concerning for neoplasm, no biopsy was done and the PET  scan categorized it as lung RADS 4Bs suspicious.  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  She was seen by oncologist and referred to radiation oncology for  SBRT for stage I non-small cell lung cancer of the left upper lobe and completed in October 2024.  PET scan was repeated in December 2024 and that showed no evidence of recurrent or progressive disease.   Infectious diseases on board.  Respiratory viral panel and MRSA PCR negative, repeat blood cultures negative in 24-hour, labs with persistent none anion gap metabolic acidosis, mild hyponatremia and slowly improving creatinine currently at 2.83.SABRA Echocardiogram with normal EF, indeterminate diastolic function and no obvious valvular vegetation.    Assessment and Plan:  Sepsis due to CAP (community acquired pneumonia) vs. Aspiration PNA with MRSA PNA MRSA bacteremia. SIRS criteria include low-grade temp, tachycardia, tachypnea, leukocytosis with elevated lactate suggestive of sepsis Chest x-ray concerning for x-ray aspiration vs. CAP Blood cultures become positive for MRSA on day 3 Continue Rocephin  and azithromycin -will complete 5-day course. Antitussives, incentive spirometer Lactate has normalized Vancomycin  was added and ID was consulted. Repeat blood cultures negative so far Echocardiogram negative for any vegetation ID to decide about TEE and duration of MRSA coverage  Esophageal dysphagia Treated with Botox  via EGD, last treatment 05/2023 Aspiration precautions Repeat SLP evaluation led to regular diet She will need outpatient f/u with Dr. Rosalie due to concern for persistent/silent aspiration   Acute renal failure superimposed on stage 4  chronic kidney disease  Slowly improving creatinine, currently at 2.83 with baseline of 2.6-2.7. Avoid nephrotoxins, renally dose meds Monitor renal function  Normocytic Anemia: Slowly worsening Hgb, at 8.0 today. History of  CKD. -Check anemia panel -Might need Epogen  -Monitor hemoglobin -Transfuse below 7  CAD (coronary artery disease) No complaints of chest pain Continue rosuvastatin , metoprolol , losartan  and aspirin    Hypertensive urgency SBP over 200 on arrival, elevated at 170 today Continue home metoprolol   Increased the dose of amlodipine  to 10 mg daily Restart home losartan  Hydralazine  IV as needed for additional control   OSA (obstructive sleep apnea) CPAP nightly   SIADH (syndrome of inappropriate ADH production)  Mild hyponatremia of 131 with known SIADH on presentation Sodium at 133 today   COPD (chronic obstructive pulmonary disease)  Not acutely exacerbated DuoNebs as needed   Type 2 diabetes mellitus with hyperlipidemia  CBG within goal Sliding scale coverage   DNR Discussed with patient/daughter and she wants to be DNR Code status changed accordingly       Consultants: SLP PT OT TOC team   Procedures: None   Antibiotics: Ceftriaxone  2/1-5 Azithromycin  2/1-5 Vancomycin  2/4   30 Day Unplanned Readmission Risk Score    Flowsheet Row ED to Hosp-Admission (Current) from 07/19/2023 in [facility]  30 Day Unplanned Readmission Risk Score (%) 25.04 Filed at 07/22/2023 0802       This score is the patient's risk of an unplanned readmission within 30 days of being discharged (0 -100%). The score is based on dignosis, age, lab data, medications, orders, and past utilization.   Low:  0-14.9   Medium: 15-21.9   High: 22-29.9   Extreme: 30 and above           Subjective: Patient was seen and examined today.  No new concern.  No back pain.  Objective: Vitals:   07/23/23 0433 07/23/23 0814  BP: (!) 172/70 (!) 172/75  Pulse: 73 79  Resp: 18 18  Temp: 98.3 F (36.8 C) 98.5 F (36.9 C)  SpO2: 97% 99%    Intake/Output Summary (Last 24 hours) at 07/23/2023 1512 Last data filed at 07/23/2023 1300 Gross per 24 hour  Intake 240 ml   Output --  Net 240 ml   Filed Weights   07/21/23 0520  Weight: 74.3 kg    Exam:  General.  Well-developed elderly lady, in no acute distress. Pulmonary.  Lungs clear bilaterally, normal respiratory effort. CV.  Regular rate and rhythm, no JVD, rub or murmur. Abdomen.  Soft, nontender, nondistended, BS positive. CNS.  Alert and oriented .  No focal neurologic deficit. Extremities.  No edema, no cyanosis, pulses intact and symmetrical. Psychiatry.  Judgment and insight appears normal.   Data Reviewed: Prior data reviewed  Family Communication: Discussed with husband at bedside and daughter on phone.  Disposition: Status is: Inpatient Remains inpatient appropriate because: bacteremia  DVT prophylaxis: Heparin   Unresulted Labs (From admission, onward)     Start     Ordered   07/24/23 0600  Vancomycin , random  Once-Timed,   TIMED       Question:  Specimen collection method  Answer:  Lab=Lab collect   07/23/23 1306   07/24/23 0600  Creatinine, serum  Tomorrow morning,   R       Question:  Specimen collection method  Answer:  Lab=Lab collect   07/23/23 1307            Time spent: 50 minutes  This  record has been created using Conservation officer, historic buildings. Errors have been sought and corrected,but may not always be located. Such creation errors do not reflect on the standard of care.   Author: Amaryllis Dare, MD 07/23/2023 3:12 PM  For on call review [email or web address].

## 2023-07-24 ENCOUNTER — Inpatient Hospital Stay: Payer: PPO

## 2023-07-24 DIAGNOSIS — J189 Pneumonia, unspecified organism: Secondary | ICD-10-CM | POA: Diagnosis not present

## 2023-07-24 DIAGNOSIS — R1319 Other dysphagia: Secondary | ICD-10-CM | POA: Diagnosis not present

## 2023-07-24 DIAGNOSIS — N179 Acute kidney failure, unspecified: Secondary | ICD-10-CM | POA: Diagnosis not present

## 2023-07-24 DIAGNOSIS — R7881 Bacteremia: Secondary | ICD-10-CM | POA: Diagnosis not present

## 2023-07-24 LAB — CREATININE, SERUM
Creatinine, Ser: 2.77 mg/dL — ABNORMAL HIGH (ref 0.44–1.00)
GFR, Estimated: 17 mL/min — ABNORMAL LOW (ref >60)

## 2023-07-24 LAB — GLUCOSE, CAPILLARY
Glucose-Capillary: 135 mg/dL — ABNORMAL HIGH (ref 70–99)
Glucose-Capillary: 137 mg/dL — ABNORMAL HIGH (ref 70–99)
Glucose-Capillary: 158 mg/dL — ABNORMAL HIGH (ref 70–99)
Glucose-Capillary: 119 mg/dL — ABNORMAL HIGH (ref 70–99)

## 2023-07-24 LAB — VANCOMYCIN, RANDOM: Vancomycin Rm: 10 ug/mL

## 2023-07-24 LAB — VITAMIN B12: Vitamin B-12: 468 pg/mL (ref 180–914)

## 2023-07-24 MED ORDER — VANCOMYCIN HCL IN DEXTROSE 1-5 GM/200ML-% IV SOLN
1000.0000 mg | INTRAVENOUS | Status: DC
  Administered 2023-07-24: 1000 mg via INTRAVENOUS
  Filled 2023-07-24: qty 200

## 2023-07-24 MED ORDER — SODIUM CHLORIDE 0.9 % IV SOLN: INTRAVENOUS | Status: DC

## 2023-07-24 MED ORDER — EPOETIN ALFA-EPBX 4000 UNIT/ML IJ SOLN
4000.0000 [IU] | INTRAMUSCULAR | Status: DC
  Administered 2023-07-24: 4000 [IU] via SUBCUTANEOUS
  Filled 2023-07-24: qty 1

## 2023-07-24 MED ORDER — EPOETIN ALFA NICU SYRINGE 2000 UNITS/ML: 400.0000 [IU]/kg | INTRAMUSCULAR | Status: DC

## 2023-07-24 MED ORDER — EPOETIN ALFA-EPBX 4000 UNIT/ML IJ SOLN
4000.0000 [IU] | INTRAMUSCULAR | Status: DC
Start: 2023-07-24 — End: 2023-07-24
  Filled 2023-07-24: qty 1

## 2023-07-24 NOTE — Progress Notes (Signed)
 Date of Admission:  07/19/2023      ID: Mckenzie Lawrence is a 78 y.o. female  Principal Problem:   Sepsis due to pneumonia [facility]) Active Problems:   Type 2 diabetes mellitus with hyperlipidemia (HCC)   OSA (obstructive sleep apnea)   CAD (coronary artery disease)   Esophageal dysphagia   Multifocal pneumonia   COPD (chronic obstructive pulmonary disease) (HCC)   Acute renal failure superimposed on stage 4 chronic kidney disease (HCC)   SIADH (syndrome of inappropriate ADH production) (HCC)   Hypertensive urgency   MRSA bacteremia   Pneumonia due to infectious organism   AKI (acute kidney injury) (HCC)    Subjective:   When I went to see the patient in the evening she was asleep She had just had MRI and CAT scan   Medications:   amLODipine   10 mg Oral Daily   aspirin  EC  81 mg Oral QHS   epoetin  alfa-epbx (RETACRIT ) injection  4,000 Units Subcutaneous Weekly   guaiFENesin   600 mg Oral BID   heparin  injection (subcutaneous)  5,000 Units Subcutaneous Q8H   insulin  aspart  0-15 Units Subcutaneous TID WC   insulin  aspart  0-5 Units Subcutaneous QHS   losartan   100 mg Oral QHS   metoprolol  succinate  100 mg Oral QHS   nystatin   5 mL Oral QID   rosuvastatin   5 mg Oral Daily    Objective: Vital signs in last 24 hours: Patient Vitals for the past 24 hrs:  BP Temp Temp src Pulse Resp SpO2  07/24/23 0825 (!) 155/54 98.2 F (36.8 C) -- 69 18 95 %  07/24/23 0611 (!) 164/59 98.4 F (36.9 C) Oral 69 14 95 %  07/23/23 2204 (!) 164/65 98.4 F (36.9 C) Oral 79 17 96 %  07/23/23 1903 (!) 178/62 98.7 F (37.1 C) Oral 82 20 100 %      PHYSICAL EXAM:  General: Alert, cooperative, no distress, appears stated age.  Head: Normocephalic, without obvious abnormality, atraumatic. Eyes: Conjunctivae clear, anicteric sclerae. Pupils are equal ENT Nares normal. No drainage or sinus tenderness. Lips, mucosa, and tongue normal. No Thrush Neck: Supple, symmetrical, no adenopathy, thyroid:  non tender no carotid bruit and no JVD. Back: No CVA tenderness. Lungs: Clear to auscultation bilaterally. No Wheezing or Rhonchi. No rales. Heart: Regular rate and rhythm, no murmur, rub or gallop. Abdomen: Soft, non-tender,not distended. Bowel sounds normal. No masses Extremities: atraumatic, no cyanosis. No edema. No clubbing Skin: No rashes or lesions. Or bruising Lymph: Cervical, supraclavicular normal. Neurologic: Grossly non-focal  Lab Results    Latest Ref Rng & Units 07/23/2023    6:14 AM 07/22/2023    5:05 AM 07/21/2023    4:09 AM  CBC  WBC 4.0 - 10.5 K/uL 6.9  6.5  7.7   Hemoglobin 12.0 - 15.0 g/dL 8.0  8.2  8.4   Hematocrit 36.0 - 46.0 % 24.3  23.8  25.8   Platelets 150 - 400 K/uL 228  187  193        Latest Ref Rng & Units 07/24/2023    5:47 AM 07/23/2023    6:14 AM 07/22/2023    5:05 AM  CMP  Glucose 70 - 99 mg/dL  876  837   BUN 8 - 23 mg/dL  53  56   Creatinine 9.55 - 1.00 mg/dL 7.22  7.16  7.03   Sodium 135 - 145 mmol/L  133  133   Potassium 3.5 - 5.1 mmol/L  4.0  3.9   Chloride 98 - 111 mmol/L  107  107   CO2 22 - 32 mmol/L  16  16   Calcium  8.9 - 10.3 mg/dL  8.8  8.7       Microbiology: [facility] MRSA 3/4  Studies/Results: MR LUMBAR SPINE WO CONTRAST Result Date: 07/24/2023 CLINICAL DATA:  Low back pain, infection suspected EXAM: MRI LUMBAR SPINE WITHOUT CONTRAST TECHNIQUE: Multiplanar, multisequence MR imaging of the lumbar spine was performed. No intravenous contrast was administered. COMPARISON:  04/29/2023 MRI lumbar spine FINDINGS: Segmentation: Transitional anatomy at S1, which is partially lumbarized. The last disc space is labeled S1-S2. Alignment: 4 mm anterolisthesis of L4 on L5 and 7 mm anterolisthesis of L5 on S1, which are both fused. Dextrocurvature. Vertebrae: Mildly increased T2 signal of L3, L4, and L5, which is improved compared to 04/29/2023. Otherwise normal marrow signal. No acute fracture, evidence of discitis, or suspicious osseous lesion. Conus  medullaris and cauda equina: Conus extends to the L1-L2 level. Conus and cauda equina appear normal. Paraspinal and other soft tissues: Increased T2 signal is noted in paraspinous musculature bilaterally (series 7, images 8 and 13), which appears more advanced than on the prior study, possibly denervation edema. Redemonstrated trace fluid collection in the more superficial soft tissues (series 8, image 13 and series 5, image 10). Disc levels: T12-L1: No significant disc bulge. No spinal canal stenosis or neural foraminal narrowing. L1-L2: No significant disc bulge. No spinal canal stenosis or neural foraminal narrowing. L2-L3: Mild disc bulge. Mild facet arthropathy. Narrowing of the left lateral recess. No spinal canal stenosis. Mild left neural foraminal narrowing. L3-L4: Status post fusion and decompression. No significant spinal canal stenosis or neural foraminal narrowing. L4-L5: Fused anterolisthesis. Status post fusion and decompression. No spinal canal stenosis. Mild moderate left neural foraminal narrowing. L5-S1: Fused anterolisthesis. Status post fusion and decompression. No spinal canal stenosis or neural foraminal narrowing. IMPRESSION: 1. Status post fusion and decompression at L3-L4, L4-L5, and L5-S1. No spinal canal stenosis at these levels. Mild-to-moderate left neural foraminal narrowing at L4-L5. 2. L2-L3 mild left neural foraminal narrowing. Narrowing of the left lateral recess at this level could affect the descending left L3 nerve roots. 3. Increased T2 signal in paraspinous musculature bilaterally, which appears more advanced than on the prior study, possibly denervation edema or myositis. 4. No evidence of discitis or osteomyelitis. Electronically Signed   By: Donald Campion M.D.   On: 07/24/2023 17:47   ECHOCARDIOGRAM COMPLETE Result Date: 07/23/2023    ECHOCARDIOGRAM REPORT   Patient Name:   Mckenzie Lawrence Date of Exam: 07/23/2023 Medical Rec #:  [id number]      Height:       67.0 in  Accession #:    [id number]     Weight:       163.8 lb Date of Birth:  1946/04/04      BSA:          1.858 m Patient Age:    77 years       BP:           172/70 mmHg Patient Gender: F              HR:           73 bpm. Exam Location:  [facility] Procedure: 2D Echo, Cardiac Doppler and Color Doppler Indications:     Bacteremia R78.81  History:         Patient has prior history of Echocardiogram examinations, most  recent 03/16/2015. COPD; Risk Factors:Dyslipidemia and                  Hypertension.  Sonographer:     Christopher Furnace Referring Phys:  [id number] JENNIFER YATES Diagnosing Phys: Timothy Gollan MD IMPRESSIONS  1. Left ventricular ejection fraction, by estimation, is 55 to 60%. Left ventricular ejection fraction by PLAX is 54 %. The left ventricle has normal function. The left ventricle has no regional wall motion abnormalities. Left ventricular diastolic parameters are indeterminate.  2. Right ventricular systolic function is normal. The right ventricular size is normal.  3. The mitral valve is normal in structure. Mild mitral valve regurgitation. No evidence of mitral stenosis.  4. The aortic valve is normal in structure. Aortic valve regurgitation is not visualized. No aortic stenosis is present.  5. The inferior vena cava is normal in size with greater than 50% respiratory variability, suggesting right atrial pressure of 3 mmHg. FINDINGS  Left Ventricle: Left ventricular ejection fraction, by estimation, is 55 to 60%. Left ventricular ejection fraction by PLAX is 54 %. The left ventricle has normal function. The left ventricle has no regional wall motion abnormalities. The left ventricular internal cavity size was normal in size. There is no left ventricular hypertrophy. Left ventricular diastolic parameters are indeterminate. Right Ventricle: The right ventricular size is normal. No increase in right ventricular wall thickness. Right ventricular systolic function is normal. Left Atrium: Left atrial size  was normal in size. Right Atrium: Right atrial size was normal in size. Pericardium: There is no evidence of pericardial effusion. Mitral Valve: The mitral valve is normal in structure. Mild mitral valve regurgitation. No evidence of mitral valve stenosis. MV peak gradient, 7.5 mmHg. The mean mitral valve gradient is 3.0 mmHg. There is no evidence of mitral valve vegetation. Tricuspid Valve: The tricuspid valve is normal in structure. Tricuspid valve regurgitation is mild . No evidence of tricuspid stenosis. Aortic Valve: The aortic valve is normal in structure. Aortic valve regurgitation is not visualized. No aortic stenosis is present. Aortic valve mean gradient measures 4.0 mmHg. Aortic valve peak gradient measures 8.0 mmHg. Aortic valve area, by VTI measures 1.60 cm. Pulmonic Valve: The pulmonic valve was normal in structure. Pulmonic valve regurgitation is not visualized. No evidence of pulmonic stenosis. Aorta: The aortic root is normal in size and structure. Venous: The inferior vena cava is normal in size with greater than 50% respiratory variability, suggesting right atrial pressure of 3 mmHg. IAS/Shunts: No atrial level shunt detected by color flow Doppler.  LEFT VENTRICLE PLAX 2D LV EF:         Left            Diastology                ventricular     LV e' medial:    5.55 cm/s                ejection        LV E/e' medial:  23.4                fraction by     LV e' lateral:   13.50 cm/s                PLAX is 54      LV E/e' lateral: 9.6                %. LVIDd:         5.30 cm LVIDs:  3.80 cm LV PW:         0.90 cm LV IVS:        0.90 cm LVOT diam:     2.00 cm LV SV:         53 LV SV Index:   28 LVOT Area:     3.14 cm  RIGHT VENTRICLE RV Basal diam:  3.20 cm RV Mid diam:    2.50 cm RV S prime:     14.10 cm/s TAPSE (M-mode): 3.1 cm LEFT ATRIUM         Index       RIGHT ATRIUM           Index LA diam:    3.80 cm 2.05 cm/m  RA Area:     17.60 cm                                 RA Volume:   46.30  ml  24.92 ml/m  AORTIC VALVE AV Area (Vmax):    1.70 cm AV Area (Vmean):   1.73 cm AV Area (VTI):     1.60 cm AV Vmax:           141.50 cm/s AV Vmean:          97.300 cm/s AV VTI:            0.330 m AV Peak Grad:      8.0 mmHg AV Mean Grad:      4.0 mmHg LVOT Vmax:         76.60 cm/s LVOT Vmean:        53.600 cm/s LVOT VTI:          0.168 m LVOT/AV VTI ratio: 0.51  AORTA Ao Root diam: 2.70 cm MITRAL VALVE                TRICUSPID VALVE MV Area (PHT): 4.96 cm     TR Peak grad:   24.0 mmHg MV Area VTI:   1.52 cm     TR Vmax:        245.00 cm/s MV Peak grad:  7.5 mmHg MV Mean grad:  3.0 mmHg     SHUNTS MV Vmax:       1.37 m/s     Systemic VTI:  0.17 m MV Vmean:      85.4 cm/s    Systemic Diam: 2.00 cm MV Decel Time: 153 msec MV E velocity: 130.00 cm/s MV A velocity: 92.80 cm/s MV E/A ratio:  1.40 Evalene Lunger MD Electronically signed by Evalene Lunger MD Signature Date/Time: 07/23/2023/11:26:53 AM    Final      Assessment/Plan: Patient presenting with cough, fever, shortness of breath of 3 days duration   Community-acquired pneumonia on azithromycin  and ceftriaxone - c completed azithromycin  and ceftriaxone .  Chest x-ray showed bilateral interstitial infiltrates more on the right side COVID, flu, RSV negative  respiratory viral PCR panel NEg CT of the chest done today   MRSA bacteremia 3 out of 4 bottle Became positive after 48 hours repeat blood culture She is  on vancomycin  Monitor closely because of CKD. MRSA nares neg  2D echo OK Need TEE  She has a history past history of MRSA lumbar spine infection.  Will get MRI spine   COPD   History of stage I non-small cell carcinoma of the left upper lobe status post SBRT   AKI on CKD improving  Anemia   H/o lumbar spine surgeries H/o MRSA infection post surgery in Feb 2024   Diabetes mellitus on insulin   Will talk to the family after TEE tomorrow.  Will decide on treatment plan after that. Patient and daughter does not want to get  PICC and IV antibiotics at home- would have to consider day surgery or PO

## 2023-07-24 NOTE — Progress Notes (Signed)
 Progress Note   Patient: Mckenzie Lawrence FMW:[id number] DOB: [birth year] DOA: 07/19/2023     4 DOS: the patient was seen and examined on 07/24/2023   Brief hospital course: 77yo with h/o COPD, CVA, stage 4 CKD, SIADH, esophageal dysphagia treated with Botox  (last in 05/2023), DM, HLD, and HTN who presented on 2/1 with cough, SOB.  Outpatient CXR with PNA, treated x 1 dose with Amoxil and Doxy but worsened and so came in.  SIRS criteria on presentation with lactate to 2.0, concerning for sepsis.  Creatinine 3.2, up from baseline 2.7.  CXR with multifocal infiltrates concerning for infection vs. Aspiration.   Treated with Ceftriaxone /Azithromycin .  No dysphagia symptoms at home.   Refused solid food during SLP evaluation so will remain on CLD until patient is ready to advance diet (will need another SLP evaluation at that time).  2/5: 3/4 blood culture bottles with MRSA.  Patient has complicated prior history of MRSA infection involving spine, summarized as below by infectious disease.lumbar L4-L5 and L5-S1 laminotomy/foraminotomies and medial facetectomy and posterior lumbar interbody fusion in April 2023, left L3-L4 redo intervertebral discectomy in December 2023, and then January 2024 underwent L3-L4 fusion surgery with instrumentation,Which was complicated by wound drainage and was taken back to the OR on 08/08/2022 for I&D.  Infection was did not track up to the deep deep tissue and did not involve the hardware.  Culture was positive for MRSA.  She was seen by infectious disease at that time and as daughter did not want her to have IV antibiotics at home she received weekly long-acting antibiotic which was dalbavancin into 3 doses and 1 dose of oritavancin .  She was seen by Dr. Prentiss as outpatient on 09/04/2022 and was doing well with complete healing of the lumbar wound.    In August 2024 she had a PET scan for a new left upper lobe nodule which was concerning for neoplasm, no biopsy was done and the PET  scan categorized it as lung RADS 4Bs suspicious.  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  She was seen by oncologist and referred to radiation oncology for  SBRT for stage I non-small cell lung cancer of the left upper lobe and completed in October 2024.  PET scan was repeated in December 2024 and that showed no evidence of recurrent or progressive disease.   Infectious diseases on board.  Respiratory viral panel and MRSA PCR negative, repeat blood cultures negative in 24-hour, labs with persistent none anion gap metabolic acidosis, mild hyponatremia and slowly improving creatinine currently at 2.83.SABRA Echocardiogram with normal EF, indeterminate diastolic function and no obvious valvular vegetation.  2/6: Vital stable, repeat blood cultures negative in 12-hour.  Anemia panel with anemia of chronic disease.  Epogen  ordered.  TEE likely tomorrow. Completed the course of antibiotics for pneumonia. Getting lumbosacral MRI as she was having some lower back pain, also ordered CT chest as advised by ID.   Assessment and Plan:  Sepsis due to CAP (community acquired pneumonia) vs. Aspiration PNA with MRSA PNA MRSA bacteremia. SIRS criteria include low-grade temp, tachycardia, tachypnea, leukocytosis with elevated lactate suggestive of sepsis Chest x-ray concerning for x-ray aspiration vs. CAP MRSA PCR negative Blood cultures become positive for MRSA on day 3 Repeat blood cultures negative in 1 day. Completed a course of antibiotics for pneumonia Vancomycin  was added and ID was consulted. Echocardiogram negative for any vegetation TEE ordered-will be done tomorrow  Lumbosacral MRI and CT chest ordered as advised by ID in order to find the source of infection.  Esophageal dysphagia Treated with Botox  via EGD, last treatment 05/2023 Aspiration precautions Repeat SLP evaluation led to regular diet She will need outpatient f/u with Dr.  Rosalie due to concern for persistent/silent aspiration   Acute renal failure superimposed on stage 4 chronic kidney disease  Slowly improving creatinine, currently at 2.83 with baseline of 2.6-2.7. Avoid nephrotoxins, renally dose meds Monitor renal function  Normocytic Anemia: Slowly worsening Hgb, at 8.0 today. History of CKD.  Anemia panel consistent with anemia of chronic disease -Epogen  ordered -Monitor hemoglobin -Transfuse below 7  CAD (coronary artery disease) No complaints of chest pain Continue rosuvastatin , metoprolol , losartan  and aspirin    Hypertensive urgency SBP over 200 on arrival, elevated at 170 today Continue home metoprolol   Increased the dose of amlodipine  to 10 mg daily Restart home losartan  Hydralazine  IV as needed for additional control   OSA (obstructive sleep apnea) CPAP nightly   SIADH (syndrome of inappropriate ADH production)  Mild hyponatremia of 131 with known SIADH on presentation Sodium at 133 today   COPD (chronic obstructive pulmonary disease)  Not acutely exacerbated DuoNebs as needed   Type 2 diabetes mellitus with hyperlipidemia  CBG within goal Sliding scale coverage   DNR Discussed with patient/daughter and she wants to be DNR Code status changed accordingly       Consultants: SLP PT OT TOC team   Procedures: None   Antibiotics: Ceftriaxone  2/1-5 Azithromycin  2/1-5 Vancomycin  2/4   30 Day Unplanned Readmission Risk Score    Flowsheet Row ED to Hosp-Admission (Current) from 07/19/2023 in [facility]  30 Day Unplanned Readmission Risk Score (%) 25.04 Filed at 07/22/2023 0802       This score is the patient's risk of an unplanned readmission within 30 days of being discharged (0 -100%). The score is based on dignosis, age, lab data, medications, orders, and past utilization.   Low:  0-14.9   Medium: 15-21.9   High: 22-29.9   Extreme: 30 and above           Subjective:  Patient was having some lower back pain earlier which responded to pain medications.  No tenderness  Objective: Vitals:   07/24/23 0611 07/24/23 0825  BP: (!) 164/59 (!) 155/54  Pulse: 69 69  Resp: 14 18  Temp: 98.4 F (36.9 C) 98.2 F (36.8 C)  SpO2: 95% 95%    Intake/Output Summary (Last 24 hours) at 07/24/2023 1444 Last data filed at 07/24/2023 0800 Gross per 24 hour  Intake 240 ml  Output --  Net 240 ml   Filed Weights   07/21/23 0520  Weight: 74.3 kg    Exam:  General.  Well-developed lady, in no acute distress. Pulmonary.  Lungs clear bilaterally, normal respiratory effort. CV.  Regular rate and rhythm, no JVD, rub or murmur. Abdomen.  Soft, nontender, nondistended, BS positive. CNS.  Alert and oriented .  No focal neurologic deficit. Extremities.  No edema, no cyanosis, pulses intact and symmetrical. Psychiatry.  Judgment and insight appears normal.   Data Reviewed: Prior data reviewed  Family Communication: Discussed with husband at bedside .  Disposition: Status is: Inpatient Remains inpatient appropriate because: bacteremia  DVT prophylaxis: Heparin   Unresulted Labs (From admission, onward)     Start     Ordered   07/25/23 0500  Creatinine, serum  Tomorrow morning,   R  Question:  Specimen collection method  Answer:  Lab=Lab collect   07/24/23 0814   07/24/23 0805  MIC (1 Drug)-blood culture - site unspecified; 07/19/2023; BLOOD; MRSA; Daptomycin   Once,   R       Question Answer Comment  Original Culture Order (Name): blood culture - site unspecified   Collection Date 07/19/2023   Collection Time 8:40 PM   Culture Source BLOOD   Organism MRSA   Antibiotic 1: Daptomycin       07/24/23 0806   07/23/23 1646  Expectorated Sputum Assessment w Gram Stain, Rflx to Resp Cult  Once,   R        07/23/23 1645            Time spent: 50 minutes  This record has been created using Conservation officer, historic buildings. Errors have been sought and  corrected,but may not always be located. Such creation errors do not reflect on the standard of care.   Author: Amaryllis Dare, MD 07/24/2023 2:44 PM  For on call review [email or web address].

## 2023-07-24 NOTE — Progress Notes (Addendum)
 Pharmacy Antibiotic Note  Mckenzie Lawrence is a 78 y.o. female admitted on 07/19/2023 with bacteremia.  Pharmacy has been consulted for vancomycin  dosing. Patient presents 2/1 with fever and cough. Prior to presentation to [facility] ED, patient went to urgent care, CXR performed and was diagnosed with pneumonia.  Patient given amoxicillin + doxycyline.  Since admission to hospital, she has been on ceftriaxone  + azithromycin . Last Feb, patient had I&D of lumbar wound and culture grew MRSA.  She has PMH of neurosurgery 10/01/2021, 05/2022 and 06/2022  Today, 07/24/2023 Day #3 vancomycin  and completed 5 days (on 2/5) of ceftriaxone /azithromycin  Renal: SCr = 2.77 - PMH CKD (baseline SCr ~2.6) WBC WNL Afebrile Respiratory virus panel: neg MRSA PCR neg 2/1 blood cultures: 4/4 GPC, BCID MRSA Took ~2.5 days for culture to become positive 2/4 blood cultures: NGTD 2/6 blood cultures: NGTD 2/5 ECHO - no vegetation  Vancomycin  dosing/levels:  2/4 vancomycin  1500mg  IV x 1 given at 0943 2/6 random vancomycin  level at 05:47 = 10 mcg/mL  Plan: Based on random level this morning and estimated clearance, start vancomycin  1gm IV q48h.  eAUC 503.5 (goal 400-600) Cmin 13.2 mcg/mL Follow renal function Check blood cultures prior to starting vancomycin  Send daptomycin  MIC to [facility] ID consult   Height: 5' 7 (170.2 cm) Weight: 74.3 kg (163 lb 12.8 oz) IBW/kg (Calculated) : 61.6  Temp (24hrs), Avg:98.5 F (36.9 C), Min:98.4 F (36.9 C), Max:98.7 F (37.1 C)  Recent Labs  Lab 07/19/23 2040 07/19/23 2049 07/19/23 2209 07/20/23 1004 07/21/23 0409 07/22/23 0505 07/23/23 0614 07/24/23 0547  WBC 12.9*  --   --  7.7 7.7 6.5 6.9  --   CREATININE 3.20*  --   --  3.01* 3.15* 2.96* 2.83* 2.77*  LATICACIDVEN  --  1.8 2.0* 1.3  --   --   --   --   VANCORANDOM  --   --   --   --   --   --   --  10    Estimated Creatinine Clearance: 18.5 mL/min (A) (by C-G formula based on SCr of 2.77 mg/dL (H)).    Allergies   Allergen Reactions   Iodine Anaphylaxis and Other (See Comments)    Pt states that she is allergic to ingested iodine only, okay for betadine.     Iodine I 131 Tositumomab Anaphylaxis   Shellfish Allergy Anaphylaxis   Codeine Nausea And Vomiting   Morphine  Sulfate Nausea And Vomiting   Irbesartan Other (See Comments)     Unknown  (Avapro)   Sulfa Antibiotics Other (See Comments)    Fever      Thank you for allowing pharmacy to be a part of this patient's care.  Imani Sherrin, PharmD, BCPS, BCIDP Work Cell: [phone] 07/24/2023 7:57 AM

## 2023-07-24 NOTE — Progress Notes (Signed)
 [facility]  ROUNDING NOTE   Subjective:   Mckenzie Lawrence is a 78 y.o. female with past medical history of CVA, SIADH, diabetes, hypertension, hyperlipidemia, COPD and chronic kidney disease stage 4. Patient presents to the hospital with fever and cough and has been admitted for CAP (community acquired pneumonia) [J18.9] AKI (acute kidney injury) (HCC) [N17.9] Sepsis due to pneumonia (HCC) [J18.9, A41.9] Pneumonia due to infectious organism, unspecified laterality, unspecified part of lung [J18.9] Sepsis, due to unspecified organism, unspecified whether acute organ dysfunction present [facility]) [A41.9]  Patient is known to our practice and is followed by Dr Marcelino. Last appt was on 06/05/23. Presented after having complaining symptoms for 3 days. Was seen recently and diagnosed with pneumonia and prescribed antibiotics and discharged. Symptoms did not improve so returned to ED. Seem sitting at bedside, daughter at bedside earlier today. Room air. Denies shortness of breath. No lower extremity edema.   Creatinine 3.20 on admission, has decreased to 2.77 during this admission. Chest xray concerning for infection vs aspiration. We have been consulted to monitor acute kidney injury.    Objective:  Vital signs in last 24 hours:  Temp:  [98.2 F (36.8 C)-98.7 F (37.1 C)] 98.2 F (36.8 C) (02/06 0825) Pulse Rate:  [69-82] 69 (02/06 0825) Resp:  [14-20] 18 (02/06 0825) BP: (155-185)/(54-66) 155/54 (02/06 0825) SpO2:  [95 %-100 %] 95 % (02/06 0825)  Weight change:  Filed Weights   07/21/23 0520  Weight: 74.3 kg    Intake/Output: I/O last 3 completed shifts: In: 240 [P.O.:240] Out: -    Intake/Output this shift:  Total I/O In: 240 [P.O.:240] Out: -   Physical Exam: General: NAD, sitting at bedside  Head: Normocephalic, atraumatic. Moist oral mucosal membranes  Eyes: Anicteric  Lungs:  Clear to auscultation, normal effort  Heart: Regular rate and rhythm  Abdomen:   Soft, nontender,   Extremities:  No peripheral edema.  Neurologic: Alert, moving all four extremities  Skin: No lesions       Basic Metabolic Panel: Recent Labs  Lab 07/19/23 2040 07/20/23 1004 07/21/23 0409 07/22/23 0505 07/23/23 0614 07/24/23 0547  NA 131* 134* 135 133* 133*  --   K 4.4 4.3 4.1 3.9 4.0  --   CL 99 107 107 107 107  --   CO2 15* 16* 19* 16* 16*  --   GLUCOSE 180* 143* 171* 162* 123*  --   BUN 56* 54* 59* 56* 53*  --   CREATININE 3.20* 3.01* 3.15* 2.96* 2.83* 2.77*  CALCIUM  8.8* 8.8* 8.7* 8.7* 8.8*  --     Liver Function Tests: Recent Labs  Lab 07/19/23 2040  AST 24  ALT 14  ALKPHOS 88  BILITOT 0.6  PROT 6.6  ALBUMIN 2.8*   No results for input(s): LIPASE, AMYLASE in the last 168 hours. No results for input(s): AMMONIA in the last 168 hours.  CBC: Recent Labs  Lab 07/19/23 2040 07/20/23 1004 07/21/23 0409 07/22/23 0505 07/23/23 0614  WBC 12.9* 7.7 7.7 6.5 6.9  NEUTROABS 11.4* 6.1 5.9 4.9 4.6  HGB 9.0* 9.3* 8.4* 8.2* 8.0*  HCT 27.2* 28.6* 25.8* 23.8* 24.3*  MCV 96.1 97.9 97.7 94.1 95.7  PLT 207 195 193 187 228    Cardiac Enzymes: No results for input(s): CKTOTAL, CKMB, CKMBINDEX, TROPONINI in the last 168 hours.  BNP: Invalid input(s): POCBNP  CBG: Recent Labs  Lab 07/23/23 0813 07/23/23 1231 07/23/23 1721 07/23/23 2145 07/24/23 0822  GLUCAP 129* 229* 153* 180*  135*    Microbiology: Results for orders placed or performed during the hospital encounter of 07/19/23  Resp panel by RT-PCR (RSV, Flu A&B, Covid) Anterior Nasal Swab     Status: None   Collection Time: 07/19/23  8:40 PM   Specimen: Anterior Nasal Swab  Result Value Ref Range Status   SARS Coronavirus 2 by RT PCR NEGATIVE NEGATIVE Final    Comment: (NOTE) SARS-CoV-2 target nucleic acids are NOT DETECTED.  The SARS-CoV-2 RNA is generally detectable in upper respiratory specimens during the acute phase of infection. The lowest concentration of  SARS-CoV-2 viral copies this assay can detect is 138 copies/mL. A negative result does not preclude SARS-Cov-2 infection and should not be used as the sole basis for treatment or other patient management decisions. A negative result may occur with  improper specimen collection/handling, submission of specimen other than nasopharyngeal swab, presence of viral mutation(s) within the areas targeted by this assay, and inadequate number of viral copies(<138 copies/mL). A negative result must be combined with clinical observations, patient history, and epidemiological information. The expected result is Negative.  Fact Sheet for Patients:  bloggercourse.com  Fact Sheet for Healthcare Providers:  seriousbroker.it  This test is no t yet approved or cleared by the [place]  FDA and  has been authorized for detection and/or diagnosis of SARS-CoV-2 by FDA under an Emergency Use Authorization (EUA). This EUA will remain  in effect (meaning this test can be used) for the duration of the COVID-19 declaration under Section 564(b)(1) of the Act, 21 U.S.C.section 360bbb-3(b)(1), unless the authorization is terminated  or revoked sooner.       Influenza A by PCR NEGATIVE NEGATIVE Final   Influenza B by PCR NEGATIVE NEGATIVE Final    Comment: (NOTE) The Xpert Xpress SARS-CoV-2/FLU/RSV plus assay is intended as an aid in the diagnosis of influenza from Nasopharyngeal swab specimens and should not be used as a sole basis for treatment. Nasal washings and aspirates are unacceptable for Xpert Xpress SARS-CoV-2/FLU/RSV testing.  Fact Sheet for Patients: bloggercourse.com  Fact Sheet for Healthcare Providers: seriousbroker.it  This test is not yet approved or cleared by the [place]  FDA and has been authorized for detection and/or diagnosis of SARS-CoV-2 by FDA under an Emergency Use  Authorization (EUA). This EUA will remain in effect (meaning this test can be used) for the duration of the COVID-19 declaration under Section 564(b)(1) of the Act, 21 U.S.C. section 360bbb-3(b)(1), unless the authorization is terminated or revoked.     Resp Syncytial Virus by PCR NEGATIVE NEGATIVE Final    Comment: (NOTE) Fact Sheet for Patients: bloggercourse.com  Fact Sheet for Healthcare Providers: seriousbroker.it  This test is not yet approved or cleared by the [place]  FDA and has been authorized for detection and/or diagnosis of SARS-CoV-2 by FDA under an Emergency Use Authorization (EUA). This EUA will remain in effect (meaning this test can be used) for the duration of the COVID-19 declaration under Section 564(b)(1) of the Act, 21 U.S.C. section 360bbb-3(b)(1), unless the authorization is terminated or revoked.  Performed at [facility], [address] [place], [place] [address]   Blood Culture (routine x 2)     Status: Abnormal (Preliminary result)   Collection Time: 07/19/23  8:40 PM   Specimen: BLOOD  Result Value Ref Range Status   Specimen Description   Final    BLOOD SITE NOT SPECIFIED Performed at [facility], [address], [place], [place] [address]    Special  Requests   Final    BOTTLES DRAWN AEROBIC AND ANAEROBIC Blood Culture results may not be optimal due to an inadequate volume of blood received in culture bottles Performed at [facility], [address] [place], [place] [address]    Culture  Setup Time   Final    GRAM POSITIVE COCCI IN BOTH AEROBIC AND ANAEROBIC BOTTLES CRITICAL VALUE NOTED.  VALUE IS CONSISTENT WITH PREVIOUSLY REPORTED AND CALLED VALUE. GRAM STAIN REVIEWED-AGREE WITH RESULT Performed at [facility], [address] [place], [place] [address]    Culture (A)  Final    STAPHYLOCOCCUS AUREUS SUSCEPTIBILITIES PERFORMED ON PREVIOUS  CULTURE WITHIN THE LAST 5 DAYS. Performed at [facility], [address], [place], [place] [address]    Report Status PENDING  Incomplete  Blood Culture (routine x 2)     Status: Abnormal (Preliminary result)   Collection Time: 07/19/23  8:40 PM   Specimen: BLOOD  Result Value Ref Range Status   Specimen Description   Final    BLOOD SITE NOT SPECIFIED Performed at [facility], [address] [place], [place] [address]    Special Requests   Final    BOTTLES DRAWN AEROBIC AND ANAEROBIC Blood Culture results may not be optimal due to an inadequate volume of blood received in culture bottles Performed at [facility], [address] [place], [place] [address]    Culture  Setup Time   Final    GRAM POSITIVE COCCI IN BOTH AEROBIC AND ANAEROBIC BOTTLES CRITICAL RESULT CALLED TO, READ BACK BY AND VERIFIED WITH:  NATHAN BELUE AT 0541 07/22/23 JG GRAM STAIN REVIEWED-AGREE WITH RESULT    Culture (A)  Final    METHICILLIN RESISTANT STAPHYLOCOCCUS AUREUS CULTURE REINCUBATED FOR BETTER GROWTH Performed at [facility], [address], [place], [place] [address]    Report Status PENDING  Incomplete   Organism ID, Bacteria METHICILLIN RESISTANT STAPHYLOCOCCUS AUREUS  Final      Susceptibility   Methicillin resistant staphylococcus aureus - MIC*    CIPROFLOXACIN  >=8 RESISTANT Resistant     ERYTHROMYCIN  >=8 RESISTANT Resistant     GENTAMICIN  <=0.5 SENSITIVE Sensitive     OXACILLIN >=4 RESISTANT Resistant     TETRACYCLINE <=1 SENSITIVE Sensitive     VANCOMYCIN  1 SENSITIVE Sensitive     TRIMETH/SULFA <=10 SENSITIVE Sensitive     CLINDAMYCIN <=0.25 SENSITIVE Sensitive     RIFAMPIN <=0.5 SENSITIVE Sensitive     Inducible Clindamycin NEGATIVE Sensitive     LINEZOLID  2 SENSITIVE Sensitive     * METHICILLIN RESISTANT STAPHYLOCOCCUS AUREUS  Blood Culture ID Panel (Reflexed)     Status: Abnormal   Collection Time: 07/19/23  8:40 PM  Result Value Ref Range Status    Enterococcus faecalis NOT DETECTED NOT DETECTED Final   Enterococcus Faecium NOT DETECTED NOT DETECTED Final   Listeria monocytogenes NOT DETECTED NOT DETECTED Final   Staphylococcus species DETECTED (A) NOT DETECTED Final    Comment: CRITICAL RESULT CALLED TO, READ BACK BY AND VERIFIED WITH:  NATHAN BELUE AT 0541 07/22/23 JG    Staphylococcus aureus (BCID) DETECTED (A) NOT DETECTED Final    Comment: Methicillin (oxacillin)-resistant Staphylococcus aureus (MRSA). MRSA is predictably resistant to beta-lactam antibiotics (except ceftaroline). Preferred therapy is vancomycin  unless clinically contraindicated. Patient requires contact precautions if  hospitalized. CRITICAL RESULT CALLED TO, READ BACK BY AND VERIFIED WITH:  NATHAN BELUE AT 9458 07/22/23 JG    Staphylococcus epidermidis NOT DETECTED NOT  DETECTED Final   Staphylococcus lugdunensis NOT DETECTED NOT DETECTED Final   Streptococcus species NOT DETECTED NOT DETECTED Final   Streptococcus agalactiae NOT DETECTED NOT DETECTED Final   Streptococcus pneumoniae NOT DETECTED NOT DETECTED Final   Streptococcus pyogenes NOT DETECTED NOT DETECTED Final   A.calcoaceticus-baumannii NOT DETECTED NOT DETECTED Final   Bacteroides fragilis NOT DETECTED NOT DETECTED Final   Enterobacterales NOT DETECTED NOT DETECTED Final   Enterobacter cloacae complex NOT DETECTED NOT DETECTED Final   Escherichia coli NOT DETECTED NOT DETECTED Final   Klebsiella aerogenes NOT DETECTED NOT DETECTED Final   Klebsiella oxytoca NOT DETECTED NOT DETECTED Final   Klebsiella pneumoniae NOT DETECTED NOT DETECTED Final   Proteus species NOT DETECTED NOT DETECTED Final   Salmonella species NOT DETECTED NOT DETECTED Final   Serratia marcescens NOT DETECTED NOT DETECTED Final   Haemophilus influenzae NOT DETECTED NOT DETECTED Final   Neisseria meningitidis NOT DETECTED NOT DETECTED Final   Pseudomonas aeruginosa NOT DETECTED NOT DETECTED Final   Stenotrophomonas maltophilia  NOT DETECTED NOT DETECTED Final   Candida albicans NOT DETECTED NOT DETECTED Final   Candida auris NOT DETECTED NOT DETECTED Final   Candida glabrata NOT DETECTED NOT DETECTED Final   Candida krusei NOT DETECTED NOT DETECTED Final   Candida parapsilosis NOT DETECTED NOT DETECTED Final   Candida tropicalis NOT DETECTED NOT DETECTED Final   Cryptococcus neoformans/gattii NOT DETECTED NOT DETECTED Final   Meth resistant mecA/C and MREJ DETECTED (A) NOT DETECTED Final    Comment: CRITICAL RESULT CALLED TO, READ BACK BY AND VERIFIED WITHBETHA RANKIN DILLS AT 0541 07/22/23 JG Performed at [facility], [address] [place], [place] [address]   Culture, blood (Routine X 2) w Reflex to ID Panel     Status: None (Preliminary result)   Collection Time: 07/22/23  9:40 AM   Specimen: BLOOD  Result Value Ref Range Status   Specimen Description BLOOD RIGHT ANTECUBITAL  Final   Special Requests   Final    BOTTLES DRAWN AEROBIC AND ANAEROBIC Blood Culture adequate volume   Culture   Final    NO GROWTH 2 DAYS Performed at [facility], [address] [place], [place] [address]    Report Status PENDING  Incomplete  Culture, blood (Routine X 2) w Reflex to ID Panel     Status: None (Preliminary result)   Collection Time: 07/22/23  9:40 AM   Specimen: BLOOD  Result Value Ref Range Status   Specimen Description BLOOD BLOOD LEFT HAND  Final   Special Requests   Final    BOTTLES DRAWN AEROBIC AND ANAEROBIC Blood Culture adequate volume   Culture   Final    NO GROWTH 2 DAYS Performed at [facility], [address] [place], [place] [address]    Report Status PENDING  Incomplete  MRSA Next Gen by PCR, Nasal     Status: None   Collection Time: 07/22/23  4:50 PM   Specimen: Nasal Mucosa; Nasal Swab  Result Value Ref Range Status   MRSA by PCR Next Gen NOT DETECTED NOT DETECTED Final    Comment: (NOTE) The GeneXpert MRSA Assay (FDA approved for NASAL specimens only), is  one component of a comprehensive MRSA colonization surveillance program. It is not intended to diagnose MRSA infection nor to guide or monitor treatment for MRSA infections. Test performance is not FDA approved in patients less than 105 years old. Performed at [facility], [address] [place],  [place] [address]   Respiratory (~20 pathogens) panel by PCR     Status: None   Collection Time: 07/22/23  4:50 PM   Specimen: Nasopharyngeal Swab; Respiratory  Result Value Ref Range Status   Adenovirus NOT DETECTED NOT DETECTED Final   Coronavirus 229E NOT DETECTED NOT DETECTED Final    Comment: (NOTE) The Coronavirus on the Respiratory Panel, DOES NOT test for the novel  Coronavirus (2019 nCoV)    Coronavirus HKU1 NOT DETECTED NOT DETECTED Final   Coronavirus NL63 NOT DETECTED NOT DETECTED Final   Coronavirus OC43 NOT DETECTED NOT DETECTED Final   Metapneumovirus NOT DETECTED NOT DETECTED Final   Rhinovirus / Enterovirus NOT DETECTED NOT DETECTED Final   Influenza A NOT DETECTED NOT DETECTED Final   Influenza B NOT DETECTED NOT DETECTED Final   Parainfluenza Virus 1 NOT DETECTED NOT DETECTED Final   Parainfluenza Virus 2 NOT DETECTED NOT DETECTED Final   Parainfluenza Virus 3 NOT DETECTED NOT DETECTED Final   Parainfluenza Virus 4 NOT DETECTED NOT DETECTED Final   Respiratory Syncytial Virus NOT DETECTED NOT DETECTED Final   Bordetella pertussis NOT DETECTED NOT DETECTED Final   Bordetella Parapertussis NOT DETECTED NOT DETECTED Final   Chlamydophila pneumoniae NOT DETECTED NOT DETECTED Final   Mycoplasma pneumoniae NOT DETECTED NOT DETECTED Final    Comment: Performed at [facility], [address] [place], [place] [address]  Culture, blood (Routine X 2) w Reflex to ID Panel     Status: None (Preliminary result)   Collection Time: 07/24/23  5:47 AM   Specimen: BLOOD  Result Value Ref Range Status   Specimen Description BLOOD BLOOD RIGHT ARM  Final   Special  Requests   Final    BOTTLES DRAWN AEROBIC AND ANAEROBIC Blood Culture adequate volume   Culture   Final    NO GROWTH < 12 HOURS Performed at [facility], [address] [place], [place] [address]    Report Status PENDING  Incomplete  Culture, blood (Routine X 2) w Reflex to ID Panel     Status: None (Preliminary result)   Collection Time: 07/24/23  5:47 AM   Specimen: BLOOD  Result Value Ref Range Status   Specimen Description BLOOD BLOOD LEFT ARM  Final   Special Requests   Final    BOTTLES DRAWN AEROBIC AND ANAEROBIC Blood Culture adequate volume   Culture   Final    NO GROWTH < 12 HOURS Performed at [facility], [address] [place], [place] [address]    Report Status PENDING  Incomplete    Coagulation Studies: No results for input(s): LABPROT, INR in the last 72 hours.  Urinalysis: No results for input(s): COLORURINE, LABSPEC, PHURINE, GLUCOSEU, HGBUR, BILIRUBINUR, KETONESUR, PROTEINUR, UROBILINOGEN, NITRITE, LEUKOCYTESUR in the last 72 hours.  Invalid input(s): APPERANCEUR    Imaging: ECHOCARDIOGRAM COMPLETE Result Date: 07/23/2023    ECHOCARDIOGRAM REPORT   Patient Name:   Mckenzie Lawrence Date of Exam: 07/23/2023 Medical Rec #:  [id number]      Height:       67.0 in Accession #:    [id number]     Weight:       163.8 lb Date of Birth:  September 10, 1945      BSA:          1.858 m Patient Age:    77 years       BP:           172/70 mmHg Patient Gender: F  HR:           73 bpm. Exam Location:  [facility] Procedure: 2D Echo, Cardiac Doppler and Color Doppler Indications:     Bacteremia R78.81  History:         Patient has prior history of Echocardiogram examinations, most                  recent 03/16/2015. COPD; Risk Factors:Dyslipidemia and                  Hypertension.  Sonographer:     Christopher Furnace Referring Phys:  [id number] JENNIFER YATES Diagnosing Phys: Timothy Gollan MD IMPRESSIONS  1. Left ventricular ejection fraction, by  estimation, is 55 to 60%. Left ventricular ejection fraction by PLAX is 54 %. The left ventricle has normal function. The left ventricle has no regional wall motion abnormalities. Left ventricular diastolic parameters are indeterminate.  2. Right ventricular systolic function is normal. The right ventricular size is normal.  3. The mitral valve is normal in structure. Mild mitral valve regurgitation. No evidence of mitral stenosis.  4. The aortic valve is normal in structure. Aortic valve regurgitation is not visualized. No aortic stenosis is present.  5. The inferior vena cava is normal in size with greater than 50% respiratory variability, suggesting right atrial pressure of 3 mmHg. FINDINGS  Left Ventricle: Left ventricular ejection fraction, by estimation, is 55 to 60%. Left ventricular ejection fraction by PLAX is 54 %. The left ventricle has normal function. The left ventricle has no regional wall motion abnormalities. The left ventricular internal cavity size was normal in size. There is no left ventricular hypertrophy. Left ventricular diastolic parameters are indeterminate. Right Ventricle: The right ventricular size is normal. No increase in right ventricular wall thickness. Right ventricular systolic function is normal. Left Atrium: Left atrial size was normal in size. Right Atrium: Right atrial size was normal in size. Pericardium: There is no evidence of pericardial effusion. Mitral Valve: The mitral valve is normal in structure. Mild mitral valve regurgitation. No evidence of mitral valve stenosis. MV peak gradient, 7.5 mmHg. The mean mitral valve gradient is 3.0 mmHg. There is no evidence of mitral valve vegetation. Tricuspid Valve: The tricuspid valve is normal in structure. Tricuspid valve regurgitation is mild . No evidence of tricuspid stenosis. Aortic Valve: The aortic valve is normal in structure. Aortic valve regurgitation is not visualized. No aortic stenosis is present. Aortic valve mean  gradient measures 4.0 mmHg. Aortic valve peak gradient measures 8.0 mmHg. Aortic valve area, by VTI measures 1.60 cm. Pulmonic Valve: The pulmonic valve was normal in structure. Pulmonic valve regurgitation is not visualized. No evidence of pulmonic stenosis. Aorta: The aortic root is normal in size and structure. Venous: The inferior vena cava is normal in size with greater than 50% respiratory variability, suggesting right atrial pressure of 3 mmHg. IAS/Shunts: No atrial level shunt detected by color flow Doppler.  LEFT VENTRICLE PLAX 2D LV EF:         Left            Diastology                ventricular     LV e' medial:    5.55 cm/s                ejection        LV E/e' medial:  23.4  fraction by     LV e' lateral:   13.50 cm/s                PLAX is 54      LV E/e' lateral: 9.6                %. LVIDd:         5.30 cm LVIDs:         3.80 cm LV PW:         0.90 cm LV IVS:        0.90 cm LVOT diam:     2.00 cm LV SV:         53 LV SV Index:   28 LVOT Area:     3.14 cm  RIGHT VENTRICLE RV Basal diam:  3.20 cm RV Mid diam:    2.50 cm RV S prime:     14.10 cm/s TAPSE (M-mode): 3.1 cm LEFT ATRIUM         Index       RIGHT ATRIUM           Index LA diam:    3.80 cm 2.05 cm/m  RA Area:     17.60 cm                                 RA Volume:   46.30 ml  24.92 ml/m  AORTIC VALVE AV Area (Vmax):    1.70 cm AV Area (Vmean):   1.73 cm AV Area (VTI):     1.60 cm AV Vmax:           141.50 cm/s AV Vmean:          97.300 cm/s AV VTI:            0.330 m AV Peak Grad:      8.0 mmHg AV Mean Grad:      4.0 mmHg LVOT Vmax:         76.60 cm/s LVOT Vmean:        53.600 cm/s LVOT VTI:          0.168 m LVOT/AV VTI ratio: 0.51  AORTA Ao Root diam: 2.70 cm MITRAL VALVE                TRICUSPID VALVE MV Area (PHT): 4.96 cm     TR Peak grad:   24.0 mmHg MV Area VTI:   1.52 cm     TR Vmax:        245.00 cm/s MV Peak grad:  7.5 mmHg MV Mean grad:  3.0 mmHg     SHUNTS MV Vmax:       1.37 m/s     Systemic VTI:  0.17 m  MV Vmean:      85.4 cm/s    Systemic Diam: 2.00 cm MV Decel Time: 153 msec MV E velocity: 130.00 cm/s MV A velocity: 92.80 cm/s MV E/A ratio:  1.40 Evalene Lunger MD Electronically signed by Evalene Lunger MD Signature Date/Time: 07/23/2023/11:26:53 AM    Final      Medications:    vancomycin  1,000 mg (07/24/23 1019)    amLODipine   10 mg Oral Daily   aspirin  EC  81 mg Oral QHS   epoetin  alfa-epbx (RETACRIT ) injection  4,000 Units Subcutaneous Weekly   guaiFENesin   600 mg Oral BID   heparin  injection (subcutaneous)  5,000 Units Subcutaneous Q8H   insulin   aspart  0-15 Units Subcutaneous TID WC   insulin  aspart  0-5 Units Subcutaneous QHS   losartan   100 mg Oral QHS   metoprolol  succinate  100 mg Oral QHS   nystatin   5 mL Oral QID   rosuvastatin   5 mg Oral Daily   acetaminophen  **OR** acetaminophen , albuterol , cyclobenzaprine , docusate sodium , hydrALAZINE , hydrOXYzine , ondansetron  **OR** ondansetron  (ZOFRAN ) IV, oxyCODONE , sucralfate   Assessment/ Plan:  Mckenzie Lawrence is a 78 y.o.  female with past medical history of CVA, SIADH, diabetes, hypertension, hyperlipidemia, COPD and chronic kidney disease stage 4. Patient presents to the hospital with fever and cough and has been admitted for CAP (community acquired pneumonia) [J18.9] AKI (acute kidney injury) (HCC) [N17.9] Sepsis due to pneumonia (HCC) [J18.9, A41.9] Pneumonia due to infectious organism, unspecified laterality, unspecified part of lung [J18.9] Sepsis, due to unspecified organism, unspecified whether acute organ dysfunction present [facility]) [A41.9]   Acute Kidney Injury on chronic kidney disease stage IV with baseline creatinine 2.72 and GFR of 18 on 05/21/23.  Acute kidney injury secondary to infection CT abd pelvis negative for hydronephrosis. Creatinine on admission 3.20 on admission. Has improved slowly during this admission. Renal function remains decreased, would continue to avoid IV contrast for now. No acute need for  dialysis but IV contrast may present a concern. Will continue to monitor.   Lab Results  Component Value Date   CREATININE 2.77 (H) 07/24/2023   CREATININE 2.83 (H) 07/23/2023   CREATININE 2.96 (H) 07/22/2023    Intake/Output Summary (Last 24 hours) at 07/24/2023 1252 Last data filed at 07/24/2023 0800 Gross per 24 hour  Intake 240 ml  Output --  Net 240 ml    2. MRSA pneumonia MRSA bacteremia, CAP vs aspiration pneumonia with sepsis. Chest xray concerning for infection vs aspiration. Blood cultures negative. ID following. Vancomycin  ordered.   3. Anemia of chronic kidney disease Lab Results  Component Value Date   HGB 8.0 (L) 07/23/2023    Hgb below baseline.   4. Secondary Hyperparathyroidism: with outpatient labs: PTH 122, phosphorus 5.4, calcium  9.6 on 07/14/23.   Lab Results  Component Value Date   CALCIUM  8.8 (L) 07/23/2023   CAION 1.20 05/27/2023    Will continue to monitor bone minerals during this admission.    LOS: 4 Avagrace Botelho 2/6/202512:52 PM

## 2023-07-25 ENCOUNTER — Other Ambulatory Visit (HOSPITAL_COMMUNITY): Payer: Self-pay

## 2023-07-25 ENCOUNTER — Inpatient Hospital Stay
Admit: 2023-07-25 | Discharge: 2023-07-25 | Disposition: A | Discharge status: Home / Self Care | Payer: PPO | Attending: Student | Admitting: Student

## 2023-07-25 ENCOUNTER — Encounter
Admission: EM | Disposition: A | Discharge status: Home Health | Payer: Self-pay | Source: Home / Self Care | Attending: Internal Medicine

## 2023-07-25 ENCOUNTER — Inpatient Hospital Stay: Payer: PPO

## 2023-07-25 ENCOUNTER — Telehealth (HOSPITAL_COMMUNITY): Payer: Self-pay | Admitting: Pharmacy Technician

## 2023-07-25 DIAGNOSIS — R7881 Bacteremia: Secondary | ICD-10-CM | POA: Diagnosis not present

## 2023-07-25 DIAGNOSIS — I2510 Atherosclerotic heart disease of native coronary artery without angina pectoris: Secondary | ICD-10-CM | POA: Diagnosis not present

## 2023-07-25 DIAGNOSIS — N179 Acute kidney failure, unspecified: Secondary | ICD-10-CM | POA: Diagnosis not present

## 2023-07-25 DIAGNOSIS — J189 Pneumonia, unspecified organism: Secondary | ICD-10-CM | POA: Diagnosis not present

## 2023-07-25 DIAGNOSIS — R1319 Other dysphagia: Secondary | ICD-10-CM | POA: Diagnosis not present

## 2023-07-25 DIAGNOSIS — A419 Sepsis, unspecified organism: Secondary | ICD-10-CM

## 2023-07-25 DIAGNOSIS — B9562 Methicillin resistant Staphylococcus aureus infection as the cause of diseases classified elsewhere: Secondary | ICD-10-CM | POA: Diagnosis not present

## 2023-07-25 HISTORY — PX: TEE WITHOUT CARDIOVERSION: SHX5443

## 2023-07-25 LAB — BASIC METABOLIC PANEL
Anion gap: 7 (ref 5–15)
BUN: 48 mg/dL — ABNORMAL HIGH (ref 8–23)
CO2: 21 mmol/L — ABNORMAL LOW (ref 22–32)
Calcium: 8.7 mg/dL — ABNORMAL LOW (ref 8.9–10.3)
Chloride: 108 mmol/L (ref 98–111)
Creatinine, Ser: 2.96 mg/dL — ABNORMAL HIGH (ref 0.44–1.00)
GFR, Estimated: 16 mL/min — ABNORMAL LOW (ref >60)
Glucose, Bld: 118 mg/dL — ABNORMAL HIGH (ref 70–99)
Potassium: 3.8 mmol/L (ref 3.5–5.1)
Sodium: 136 mmol/L (ref 135–145)

## 2023-07-25 LAB — CBC
HCT: 24.1 % — ABNORMAL LOW (ref 36.0–46.0)
Hemoglobin: 7.7 g/dL — ABNORMAL LOW (ref 12.0–15.0)
MCH: 31.3 pg (ref 26.0–34.0)
MCHC: 32 g/dL (ref 30.0–36.0)
MCV: 98 fL (ref 80.0–100.0)
Platelets: 306 10*3/uL (ref 150–400)
RBC: 2.46 MIL/uL — ABNORMAL LOW (ref 3.87–5.11)
RDW: 14 % (ref 11.5–15.5)
WBC: 8.6 10*3/uL (ref 4.0–10.5)
nRBC: 0 % (ref 0.0–0.2)

## 2023-07-25 LAB — CULTURE, BLOOD (ROUTINE X 2)

## 2023-07-25 LAB — GLUCOSE, CAPILLARY
Glucose-Capillary: 133 mg/dL — ABNORMAL HIGH (ref 70–99)
Glucose-Capillary: 129 mg/dL — ABNORMAL HIGH (ref 70–99)
Glucose-Capillary: 145 mg/dL — ABNORMAL HIGH (ref 70–99)
Glucose-Capillary: 211 mg/dL — ABNORMAL HIGH (ref 70–99)

## 2023-07-25 LAB — ECHO TEE

## 2023-07-25 SURGERY — ECHOCARDIOGRAM, TRANSESOPHAGEAL
Anesthesia: General

## 2023-07-25 MED ORDER — LINEZOLID 600 MG PO TABS
600.0000 mg | ORAL_TABLET | Freq: Two times a day (BID) | ORAL | Status: DC
  Administered 2023-07-26: 600 mg via ORAL
  Filled 2023-07-25: qty 1

## 2023-07-25 MED ORDER — EPINEPHRINE 1 MG/10ML IJ SOSY
PREFILLED_SYRINGE | INTRAMUSCULAR | Status: AC
  Filled 2023-07-25: qty 10

## 2023-07-25 MED ORDER — PROPOFOL 10 MG/ML IV BOLUS
INTRAVENOUS | Status: DC | PRN
  Administered 2023-07-25: 20 mg via INTRAVENOUS
  Administered 2023-07-25: 50 mg via INTRAVENOUS
  Administered 2023-07-25 (×4): 20 mg via INTRAVENOUS

## 2023-07-25 MED ORDER — GLYCOPYRROLATE 0.2 MG/ML IJ SOLN
INTRAMUSCULAR | Status: AC
  Filled 2023-07-25: qty 1

## 2023-07-25 MED ORDER — ATROPINE SULFATE 1 MG/10ML IJ SOSY
PREFILLED_SYRINGE | INTRAMUSCULAR | Status: AC
  Filled 2023-07-25: qty 10

## 2023-07-25 MED ORDER — BUTAMBEN-TETRACAINE-BENZOCAINE 2-2-14 % EX AERO
INHALATION_SPRAY | CUTANEOUS | Status: AC
  Filled 2023-07-25: qty 5

## 2023-07-25 MED ORDER — FE FUM-VIT C-VIT B12-FA 460-60-0.01-1 MG PO CAPS
1.0000 | ORAL_CAPSULE | Freq: Every day | ORAL | Status: DC
  Administered 2023-07-25 – 2023-07-26 (×2): 1 via ORAL
  Filled 2023-07-25 (×2): qty 1

## 2023-07-25 MED ORDER — LACTATED RINGERS IV SOLN: INTRAVENOUS | Status: AC

## 2023-07-25 MED ORDER — LIDOCAINE VISCOUS HCL 2 % MT SOLN
OROMUCOSAL | Status: AC
  Filled 2023-07-25: qty 15

## 2023-07-25 MED ORDER — EPHEDRINE 5 MG/ML INJ
INTRAVENOUS | Status: AC
  Filled 2023-07-25: qty 5

## 2023-07-25 MED ORDER — PHENYLEPHRINE 80 MCG/ML (10ML) SYRINGE FOR IV PUSH (FOR BLOOD PRESSURE SUPPORT)
PREFILLED_SYRINGE | INTRAVENOUS | Status: AC
  Filled 2023-07-25: qty 10

## 2023-07-25 MED ORDER — LINEZOLID 600 MG PO TABS: 600.0000 mg | ORAL_TABLET | Freq: Two times a day (BID) | ORAL | 0 refills | Status: AC

## 2023-07-25 MED ORDER — PROPOFOL 1000 MG/100ML IV EMUL
INTRAVENOUS | Status: AC
  Filled 2023-07-25: qty 100

## 2023-07-25 NOTE — H&P (Signed)
 Pre-procedure History & Physical    Patient ID: Mckenzie Lawrence MRN: [id number]; DOB: 1946/01/12   Date of procedure: 07/19/2023  Primary Care Provider: [name], MD Primary Cardiologist: None   Planned procedure:  TEE  HPI:   Mckenzie Lawrence is a 78 y.o. female with MRSA bacteremia. Here for TEE to r/o bacterial endocarditis  Past Medical History:  Diagnosis Date   Anemia    Arthritis    Bladder incontinence    Broken foot    Cataracts, bilateral    Chronic kidney insufficiency    stage 3b   COPD (chronic obstructive pulmonary disease) (HCC)    wheezing   Coronary artery disease 04/25/2022   in [place]   COVID 2021   very mild case   CVA (cerebral vascular accident) (HCC) 2016   has had 3 strokes, states right side is slightlyweaker than left   Diabetes mellitus    insulin  dependent, Type 2   GERD (gastroesophageal reflux disease)    HLD (hyperlipidemia)    Hx of cardiovascular stress test    a. ETT (6/13):  Ex 5:13; no ischemic changes   Hypertension    controlled on meds   Lacunar stroke of left subthalamic region [facility]) 02/2015   Leg pain    left   Lower back pain    Neuromuscular disorder (HCC)    stroke right hand tingling   Orthostatic hypotension    Osteopenia 01/2017   T score -2.0 stable from prior DEXA   Pancreatitis 10/2021   PCOS (polycystic ovarian syndrome)    Personal history of tobacco use, presenting hazards to health 01/09/2015   PONV (postoperative nausea and vomiting)    Sleep apnea    uses CPAP nightly    Past Surgical History:  Procedure Laterality Date   BIOPSY  08/28/2022   Procedure: BIOPSY;  Surgeon: Elicia Claw, MD;  [facility];  Service: Gastroenterology;;   BOTOX  INJECTION  01/15/2022   Procedure: BOTOX  INJECTION;  Surgeon: Rosalie Kitchens, MD;  Location: [facility];  Service: Gastroenterology;;   BOTOX  INJECTION N/A 08/28/2022   Procedure: BOTOX  INJECTION;  Surgeon: Elicia Claw, MD;  [facility];   Service: Gastroenterology;  Laterality: N/A;   BOTOX  INJECTION Bilateral 05/27/2023   Procedure: BOTOX  INJECTION;  Surgeon: Rosalie Kitchens, MD;  [facility];  Service: Gastroenterology;  Laterality: Bilateral;   BROW LIFT Bilateral 11/04/2017   Procedure: BLEPHAROPLASTY UPPER EYELID W/EXCESS SKIN;  Surgeon: Ashley Greig CHRISTELLA, MD;  Location: [facility];  Service: Ophthalmology;  Laterality: Bilateral;  DIABETES-insulin  dependent uses CPAP   CARDIAC CATHETERIZATION  20 yrs ago   found nothing   CARPAL TUNNEL RELEASE Bilateral    CATARACT EXTRACTION Bilateral    ELBOW SURGERY Bilateral    ESOPHAGOGASTRODUODENOSCOPY N/A 07/25/2021   Procedure: ESOPHAGOGASTRODUODENOSCOPY (EGD);  Surgeon: Toledo, Ladell POUR, MD;  Location: [facility];  Service: Gastroenterology;  Laterality: N/A;  IDDM   ESOPHAGOGASTRODUODENOSCOPY N/A 01/15/2022   Procedure: ESOPHAGOGASTRODUODENOSCOPY (EGD);  Surgeon: Rosalie Kitchens, MD;  Location: [facility];  Service: Gastroenterology;  Laterality: N/A;  botox    ESOPHAGOGASTRODUODENOSCOPY (EGD) WITH PROPOFOL  N/A 08/28/2022   Procedure: ESOPHAGOGASTRODUODENOSCOPY (EGD) WITH PROPOFOL ;  Surgeon: Elicia Claw, MD;  [facility];  Service: Gastroenterology;  Laterality: N/A;   ESOPHAGOGASTRODUODENOSCOPY (EGD) WITH PROPOFOL  Bilateral 05/27/2023   Procedure: ESOPHAGOGASTRODUODENOSCOPY (EGD) WITH PROPOFOL ;  Surgeon: Rosalie Kitchens, MD;  [facility];  Service: Gastroenterology;  Laterality: Bilateral;   FOOT SURGERY     Groin Abscess  HAND SURGERY     KNEE SURGERY Bilateral    LABIAL ABSCESS     LUMBAR LAMINECTOMY/DECOMPRESSION MICRODISCECTOMY Left 05/11/2018   Procedure: LUMBAR LAMINECTOMY/DECOMPRESSION MICRODISCECTOMY 1 LEVEL- L4-5;  Surgeon: Bluford Standing, MD;  Location: [facility];  Service: Neurosurgery;  Laterality: Left;   LUMBAR LAMINECTOMY/DECOMPRESSION MICRODISCECTOMY Left 05/20/2022   Procedure: MICRODISCECTOMY L3-4;  Surgeon: Mavis Purchase,  MD;  [facility];  Service: Neurosurgery;  Laterality: Left;  3C   LUMBAR WOUND DEBRIDEMENT N/A 08/08/2022   Procedure: INCISION AND DRAINAGE OF LUMBAR WOUND;  Surgeon: Mavis Purchase, MD;  Location: [facility] OR;  Service: Neurosurgery;  Laterality: N/A;  3C   OOPHORECTOMY     BSO   PUBO VAG SLING     SHOULDER SURGERY     bilateral arthroscopies   VAGINAL HYSTERECTOMY  1979     Medications Prior to Admission: Prior to Admission medications   Medication Sig Start Date End Date Taking? Authorizing Provider  acetaminophen  (TYLENOL ) 500 MG tablet Take 1,000 mg by mouth every 6 (six) hours as needed for moderate pain.   Yes [provider]  albuterol  (VENTOLIN  HFA) 108 (90 Base) MCG/ACT inhaler Inhale 2 puffs into the lungs every 6 (six) hours as needed for wheezing or shortness of breath. 07/19/23 07/18/24 Yes [provider]  amoxicillin (AMOXIL) 500 MG capsule Take 500 mg by mouth 2 (two) times daily. 07/19/23 07/26/23 Yes [provider]  aspirin  EC 81 MG tablet Take 81 mg by mouth at bedtime. Swallow whole.   Yes [provider]  calcitRIOL  (ROCALTROL ) 0.25 MCG capsule Take 0.25 mcg by mouth at bedtime. 08/17/21  Yes [provider]  cholecalciferol (VITAMIN D3) 25 MCG (1000 UNIT) tablet Take 1,000 Units by mouth daily.   Yes [provider]  D-MANNOSE PO Take 2 tablets by mouth at bedtime.   Yes [provider]  diazepam  (VALIUM ) 5 MG tablet Take 5 mg by mouth every 12 (twelve) hours as needed for anxiety. 07/04/23  Yes [provider]  diphenhydrAMINE  (BENADRYL ) 25 MG tablet Take 50 mg by mouth at bedtime as needed for sleep.   Yes [provider]  docusate sodium  (COLACE) 100 MG capsule Take 1 capsule (100 mg total) by mouth 2 (two) times daily. Patient taking differently: Take 100 mg by mouth at bedtime as needed for mild constipation or moderate constipation. 05/21/22  Yes Mavis Purchase, MD  doxycycline  (ADOXA) 100  MG tablet Take 100 mg by mouth 2 (two) times daily. 07/19/23 07/26/23 Yes [provider]  estradiol  (ESTRACE ) 0.1 MG/GM vaginal cream Place 1 Applicatorful vaginally 3 (three) times a week. 11/23/21  Yes [provider]  feeding supplement (BOOST HIGH PROTEIN) LIQD Take 1 Container by mouth 2 (two) times daily between meals.   Yes [provider]  insulin  detemir (LEVEMIR ) 100 UNIT/ML injection Inject 100-120 Units into the skin daily.   Yes [provider]  losartan  (COZAAR ) 100 MG tablet Take 1 tablet (100 mg total) by mouth daily. Patient taking differently: Take 100 mg by mouth at bedtime. 09/26/15  Yes Sherial Bail, MD  metoprolol  succinate (TOPROL -XL) 100 MG 24 hr tablet Take 100 mg by mouth at bedtime.   Yes [provider]  Multiple Vitamins-Minerals (PRESERVISION AREDS 2+MULTI VIT PO) Take 1 capsule by mouth daily.   Yes [provider]  nitrofurantoin  (MACRODANTIN ) 100 MG capsule Take 100 mg by mouth at bedtime. 06/23/23  Yes [provider]  rosuvastatin  (CRESTOR ) 5 MG tablet Take 5  mg by mouth daily.   Yes [provider]  sennosides-docusate sodium  (SENOKOT-S) 8.6-50 MG tablet Take 1 tablet by mouth daily as needed for constipation.   Yes [provider]  Vibegron (GEMTESA) 75 MG TABS Take 75 mg by mouth at bedtime.   Yes [provider]  zolpidem  (AMBIEN ) 10 MG tablet Take 10 mg by mouth at bedtime as needed for sleep. 12/20/21  Yes [provider]  BD INSULIN  SYRINGE U/F 31G X 5/16 1 ML MISC USE 1 SYRINGE AS DIRECTED 07/10/20   [provider]  Continuous Glucose Sensor (FREESTYLE LIBRE 2 SENSOR) MISC  05/07/23   [provider]  [facility] ULTRA test strip 4 (four) times daily. 07/11/20   [provider]  [facility] syringe  03/25/23   [provider]     Allergies:    Allergies  Allergen Reactions   Iodine Anaphylaxis and Other (See Comments)    Pt  states that she is allergic to ingested iodine only, okay for betadine.     Iodine I 131 Tositumomab Anaphylaxis   Shellfish Allergy Anaphylaxis   Codeine Nausea And Vomiting   Morphine  Sulfate Nausea And Vomiting   Irbesartan Other (See Comments)     Unknown  (Avapro)   Sulfa Antibiotics Other (See Comments)    Fever     Social History:   Social History   Socioeconomic History   Marital status: Married    Spouse name: Not on file   Number of children: 1   Years of education: Not on file   Highest education level: Not on file  Occupational History   Not on file  Tobacco Use   Smoking status: Every Day    Current packs/day: 1.50    Average packs/day: 1.5 packs/day for 50.0 years (75.0 ttl pk-yrs)    Types: Cigarettes   Smokeless tobacco: Never   Tobacco comments:    3ppd x 10 year, then cut back to 1.5pdd since 02/2015  Vaping Use   Vaping status: Never Used  Substance and Sexual Activity   Alcohol use: Never    Alcohol/week: 0.0 standard drinks of alcohol   Drug use: No   Sexual activity: Not Currently    Birth control/protection: Surgical    Comment: Hx Hysterectomy, 1st intercourse 78 yo-Fewer than 5 partners  Other Topics Concern   Not on file  Social History Narrative   Lives at home with husband in a one story home.  Has 1 daughter.     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     Education: some college.   Social Drivers of Corporate Investment Banker Strain: Low Risk  (02/10/2023)   Received from [facility]   Overall Financial Resource Strain (CARDIA)    Difficulty of Paying Living Expenses: Not hard at all  Food Insecurity: No Food Insecurity (07/20/2023)   Hunger Vital Sign    Worried About Running Out of Food in the Last Year: Never true    Ran Out of Food in the Last Year: Never true  Transportation Needs: No Transportation Needs (07/20/2023)   PRAPARE - Administrator, Civil Service (Medical): No    Lack of  Transportation (Non-Medical): No  Physical Activity: Not on file  Stress: Not on file  Social Connections: Unknown (07/21/2023)   Social Connection and Isolation Panel [NHANES]    Frequency of Communication with Friends and Family: More than three times a week  Frequency of Social Gatherings with Friends and Family: More than three times a week    Attends Religious Services: Patient declined    Active Member of Clubs or Organizations: Patient declined    Attends Banker Meetings: Patient declined    Marital Status: Married  Catering Manager Violence: Not At Risk (07/20/2023)   Humiliation, Afraid, Rape, and Kick questionnaire    Fear of Current or Ex-Partner: No    Emotionally Abused: No    Physically Abused: No    Sexually Abused: No     Family History:   The patient's family history includes Cancer in her sister; Diabetes in her brother, brother, father, and sister; Heart disease (age of onset: 73) in her brother; Heart disease (age of onset: 66) in her mother; Hypertension in her brother, mother, and sister.    ROS:  Please see the history of present illness.  All other ROS reviewed and negative.     Physical Exam/Data:   Vitals:   07/24/23 2231 07/25/23 0328 07/25/23 0903 07/25/23 1147  BP: (!) 150/52 (!) 148/56 (!) 164/57 (!) 172/62  Pulse: 73 69 65 71  Resp:  16 18 20   Temp:  98.4 F (36.9 C) 98.2 F (36.8 C) 98.4 F (36.9 C)  TempSrc:  Oral Oral Oral  SpO2:  96% 100% 96%  Weight:    74.3 kg  Height:    5' 7 (1.702 m)    Intake/Output Summary (Last 24 hours) at 07/25/2023 1211 Last data filed at 07/24/2023 1605 Gross per 24 hour  Intake 0 ml  Output --  Net 0 ml      07/25/2023   11:47 AM 07/21/2023    5:20 AM 07/15/2023    1:00 PM  Last 3 Weights  Weight (lbs) 163 lb 12.8 oz 163 lb 12.8 oz 158 lb  Weight (kg) 74.3 kg 74.3 kg 71.668 kg     Body mass index is 25.66 kg/m.  Wt Readings from Last 3 Encounters:  07/25/23 74.3 kg  07/15/23 71.7 kg   05/27/23 72.1 kg    Physical Exam: General: no acute distress. Head: Normocephalic, atraumatic  Neck: supple Lungs: nl effort Heart: RRR Abdomen: Soft Msk:  Strength and tone appear normal for age. Extremities: warm, dry Neuro: awake and alert Psych:  Responds to questions appropriately with a normal affect.    Laboratory Data:  Chemistry Recent Labs  Lab 07/23/23 [phone] 07/24/23 0547 07/25/23 0606  NA 133*  --  136  K 4.0  --  3.8  CL 107  --  108  CO2 16*  --  21*  GLUCOSE 123*  --  118*  BUN 53*  --  48*  CREATININE 2.83* 2.77* 2.96*  CALCIUM  8.8*  --  8.7*  GFRNONAA 17* 17* 16*  ANIONGAP 10  --  7    Recent Labs  Lab 07/19/23 2040  PROT 6.6  ALBUMIN 2.8*  AST 24  ALT 14  ALKPHOS 88  BILITOT 0.6   Hematology Recent Labs  Lab 07/23/23 0614 07/25/23 0606  WBC 6.9 8.6  RBC 2.54*  2.55* 2.46*  HGB 8.0* 7.7*  HCT 24.3* 24.1*  MCV 95.7 98.0  MCH 31.5 31.3  MCHC 32.9 32.0  RDW 13.7 14.0  PLT 228 306    Assessment and Plan   Will proceed w/ TEE  ASA III  Anesthesia to be administered by CRNA  Signed, DEARL LEAVEN, MD 07/25/2023, 12:11 PM

## 2023-07-25 NOTE — Anesthesia Preprocedure Evaluation (Signed)
 Anesthesia Evaluation  Patient identified by MRN, date of birth, ID band Patient awake    Reviewed: Allergy & Precautions, NPO status , Patient's Chart, lab work & pertinent test results  History of Anesthesia Complications (+) PONV and history of anesthetic complications  Airway Mallampati: III  TM Distance: >3 FB Neck ROM: Full    Dental  (+) Implants, Caps, Teeth Intact, Dental Advisory Given   Pulmonary neg pulmonary ROS, sleep apnea , COPD, Current Smoker   Pulmonary exam normal breath sounds clear to auscultation       Cardiovascular hypertension, Pt. on medications and Pt. on home beta blockers + CAD  negative cardio ROS Normal cardiovascular exam Rhythm:Regular Rate:Normal     Neuro/Psych  PSYCHIATRIC DISORDERS Anxiety      Neuromuscular disease CVA negative neurological ROS  negative psych ROS   GI/Hepatic negative GI ROS, Neg liver ROS,GERD  ,,  Endo/Other  negative endocrine ROSdiabetes    Renal/GU negative Renal ROS  negative genitourinary   Musculoskeletal   Abdominal   Peds  Hematology negative hematology ROS (+) Blood dyscrasia, anemia   Anesthesia Other Findings Past Medical History: No date: Anemia No date: Arthritis No date: Bladder incontinence No date: Broken foot No date: Cataracts, bilateral No date: Chronic kidney insufficiency     Comment:  stage 3b No date: COPD (chronic obstructive pulmonary disease) (HCC)     Comment:  wheezing 04/25/2022: Coronary artery disease     Comment:  in [place] 2021: COVID     Comment:  very mild case 2016: CVA (cerebral vascular accident) (HCC)     Comment:  has had 3 strokes, states right side is slightlyweaker               than left No date: Diabetes mellitus     Comment:  insulin  dependent, Type 2 No date: GERD (gastroesophageal reflux disease) No date: HLD (hyperlipidemia) No date: Hx of cardiovascular stress test     Comment:  a. ETT (6/13):  Ex  5:13; no ischemic changes No date: Hypertension     Comment:  controlled on meds 02/2015: Lacunar stroke of left subthalamic region [facility]) No date: Leg pain     Comment:  left No date: Lower back pain No date: Neuromuscular disorder (HCC)     Comment:  stroke right hand tingling No date: Orthostatic hypotension 01/2017: Osteopenia     Comment:  T score -2.0 stable from prior DEXA 10/2021: Pancreatitis No date: PCOS (polycystic ovarian syndrome) 01/09/2015: Personal history of tobacco use, presenting hazards to  health No date: PONV (postoperative nausea and vomiting) No date: Sleep apnea     Comment:  uses CPAP nightly  Past Surgical History: 08/28/2022: BIOPSY     Comment:  Procedure: BIOPSY;  Surgeon: Elicia Claw, MD;                [facility];  Service: Gastroenterology;; 01/15/2022: BOTOX  INJECTION     Comment:  Procedure: BOTOX  INJECTION;  Surgeon: Rosalie Kitchens, MD;                [facility];  Service: Gastroenterology;; 08/28/2022: BOTOX  INJECTION; N/A     Comment:  Procedure: BOTOX  INJECTION;  Surgeon: Elicia Claw,              MD;  [facility];  Service: Gastroenterology;               Laterality: N/A; 05/27/2023: BOTOX  INJECTION; Bilateral     Comment:  Procedure: BOTOX  INJECTION;  Surgeon: Rosalie Kitchens, MD;                Location: [facility];  Service: Gastroenterology;                Laterality: Bilateral; 11/04/2017: BROW LIFT; Bilateral     Comment:  Procedure: BLEPHAROPLASTY UPPER EYELID W/EXCESS SKIN;                Surgeon: Ashley Greig HERO, MD;  Location: [facility];  Service: Ophthalmology;  Laterality: Bilateral;                DIABETES-insulin  dependent uses CPAP 20 yrs ago: CARDIAC CATHETERIZATION     Comment:  found nothing No date: CARPAL TUNNEL RELEASE; Bilateral No date: CATARACT EXTRACTION; Bilateral No date: ELBOW SURGERY; Bilateral 07/25/2021: ESOPHAGOGASTRODUODENOSCOPY; N/A      Comment:  Procedure: ESOPHAGOGASTRODUODENOSCOPY (EGD);  Surgeon:               Toledo, Ladell POUR, MD;  Location: [facility];                Service: Gastroenterology;  Laterality: N/A;  IDDM 01/15/2022: ESOPHAGOGASTRODUODENOSCOPY; N/A     Comment:  Procedure: ESOPHAGOGASTRODUODENOSCOPY (EGD);  Surgeon:               Rosalie Kitchens, MD;  Location: [facility];  Service:               Gastroenterology;  Laterality: N/A;  botox  08/28/2022: ESOPHAGOGASTRODUODENOSCOPY (EGD) WITH PROPOFOL ; N/A     Comment:  Procedure: ESOPHAGOGASTRODUODENOSCOPY (EGD) WITH               PROPOFOL ;  Surgeon: Elicia Claw, MD;  [facility];  Service: Gastroenterology;  Laterality: N/A; 05/27/2023: ESOPHAGOGASTRODUODENOSCOPY (EGD) WITH PROPOFOL ; Bilateral     Comment:  Procedure: ESOPHAGOGASTRODUODENOSCOPY (EGD) WITH               PROPOFOL ;  Surgeon: Rosalie Kitchens, MD;  [facility];  Service: Gastroenterology;  Laterality:               Bilateral; No date: FOOT SURGERY No date: Groin Abscess No date: HAND SURGERY No date: KNEE SURGERY; Bilateral No date: LABIAL ABSCESS 05/11/2018: LUMBAR LAMINECTOMY/DECOMPRESSION MICRODISCECTOMY; Left     Comment:  Procedure: LUMBAR LAMINECTOMY/DECOMPRESSION               MICRODISCECTOMY 1 LEVEL- L4-5;  Surgeon: Bluford Standing,               MD;  Location: [facility];  Service: Neurosurgery;                Laterality: Left; 05/20/2022: LUMBAR LAMINECTOMY/DECOMPRESSION MICRODISCECTOMY; Left     Comment:  Procedure: MICRODISCECTOMY L3-4;  Surgeon: Mavis Purchase, MD;  Location: [facility] OR;  Service: Neurosurgery;                Laterality: Left;  3C 08/08/2022: LUMBAR WOUND DEBRIDEMENT; N/A     Comment:  Procedure: INCISION AND DRAINAGE OF LUMBAR WOUND;                Surgeon: Mavis Purchase, MD;  Location: [facility]  OR;                Service: Neurosurgery;  Laterality: N/A;  3C No date: OOPHORECTOMY     Comment:  BSO No  date: PUBO VAG SLING No date: SHOULDER SURGERY     Comment:  bilateral arthroscopies 1979: VAGINAL HYSTERECTOMY  BMI    Body Mass Index: 25.66 kg/m      Reproductive/Obstetrics negative OB ROS                             Anesthesia Physical Anesthesia Plan  ASA: 4  Anesthesia Plan: General   Post-op Pain Management: Minimal or no pain anticipated   Induction: Intravenous  PONV Risk Score and Plan: 3 and Propofol  infusion, TIVA and Ondansetron   Airway Management Planned: Nasal Cannula  Additional Equipment: None  Intra-op Plan:   Post-operative Plan:   Informed Consent: I have reviewed the patients History and Physical, chart, labs and discussed the procedure including the risks, benefits and alternatives for the proposed anesthesia with the patient or authorized representative who has indicated his/her understanding and acceptance.     Dental advisory given  Plan Discussed with: CRNA and Surgeon  Anesthesia Plan Comments: (Discussed risks of anesthesia with patient, including possibility of difficulty with spontaneous ventilation under anesthesia necessitating airway intervention, PONV, and rare risks such as cardiac or respiratory or neurological events, and allergic reactions. Discussed the role of CRNA in patient's perioperative care. Patient understands.)       Anesthesia Quick Evaluation

## 2023-07-25 NOTE — Transfer of Care (Signed)
 Immediate Anesthesia Transfer of Care Note  Patient: Ronal HERO [place]sworth  Procedure(s) Performed: TRANSESOPHAGEAL ECHOCARDIOGRAM (TEE)  Patient Location: PACU  Anesthesia Type:MAC  Level of Consciousness: drowsy  Airway & Oxygen Therapy: Patient Spontanous Breathing and Patient connected to nasal cannula oxygen  Post-op Assessment: Report given to RN and Post -op Vital signs reviewed and stable  Post vital signs: Reviewed and stable  Last Vitals:  Vitals Value Taken Time  BP 145/57 07/25/23 1306  Temp    Pulse 66 07/25/23 1309  Resp    SpO2 99 % 07/25/23 1309    Last Pain:  Vitals:   07/25/23 1147  TempSrc: Oral  PainSc: 0-No pain      Patients Stated Pain Goal: 2 (07/24/23 1947)  Complications: No notable events documented.

## 2023-07-25 NOTE — Progress Notes (Signed)
 Transesophageal echocardiogram preliminary report  EARLA CHARLIE [id number] [birth year]  Preliminary diagnosis  Bacteremia with possible endocarditis  Postprocedural diagnosis bacteremia  Time out A timeout was performed by the nursing staff and physicians specifically identifying the procedure performed, identification of the patient, the type of sedation, all allergies and medications, all pertinent medical history, and presedation assessment of nasopharynx. The patient and or family understand the risks of the procedure including the rare risks of death, stroke, heart attack, esophogeal perforation, sore throat, and reaction to medications given.  General anesthesia CRNA administered GA. See MAR.  150 mg propofol . The patient had continued monitoring of heart rate, oxygenation, blood pressure, respiratory rate, and extent of signs of sedation throughout the entire procedure.  The patient received anesthesia over a period of 20 minutes.  CRNA, nursing staff and I were present during the procedure when the patient was anesthetized for 100% of the time.  Treatment considerations No valvular vegetations  For further details of transesophageal echocardiogram please refer to final report.  Bonney ROCHER [facility] MD [facility] 07/25/2023 1:06 PM

## 2023-07-25 NOTE — Progress Notes (Signed)
*  PRELIMINARY RESULTS* Echocardiogram Echocardiogram Transesophageal has been performed.  Mckenzie Lawrence 07/25/2023, 1:41 PM

## 2023-07-25 NOTE — Progress Notes (Signed)
 Date of Admission:  07/19/2023      ID: Mckenzie Lawrence is a 78 y.o. female  Principal Problem:   Sepsis due to pneumonia [facility]) Active Problems:   Type 2 diabetes mellitus with hyperlipidemia (HCC)   OSA (obstructive sleep apnea)   CAD (coronary artery disease)   Esophageal dysphagia   Multifocal pneumonia   COPD (chronic obstructive pulmonary disease) (HCC)   Acute renal failure superimposed on stage 4 chronic kidney disease (HCC)   SIADH (syndrome of inappropriate ADH production) (HCC)   Hypertensive urgency   MRSA bacteremia   Pneumonia due to infectious organism   AKI (acute kidney injury) (HCC)    Subjective:     Family at bed side Pt is doing fine Cough better Had TEE and neg for endocarditis Yesterday she slept a lot due to many pain meds  Medications:   [MAR Hold] amLODipine   10 mg Oral Daily   [MAR Hold] aspirin  EC  81 mg Oral QHS   butamben -tetracaine -benzocaine        [MAR Hold] epoetin  alfa-epbx (RETACRIT ) injection  4,000 Units Subcutaneous Weekly   [MAR Hold] Fe Fum-Vit C-Vit B12-FA  1 capsule Oral QPC breakfast   [MAR Hold] guaiFENesin   600 mg Oral BID   [MAR Hold] heparin  injection (subcutaneous)  5,000 Units Subcutaneous Q8H   [MAR Hold] insulin  aspart  0-15 Units Subcutaneous TID WC   [MAR Hold] insulin  aspart  0-5 Units Subcutaneous QHS   lidocaine        [MAR Hold] losartan   100 mg Oral QHS   [MAR Hold] metoprolol  succinate  100 mg Oral QHS   [MAR Hold] nystatin   5 mL Oral QID   [MAR Hold] rosuvastatin   5 mg Oral Daily    Objective: Vital signs in last 24 hours: Patient Vitals for the past 24 hrs:  BP Temp Temp src Pulse Resp SpO2 Height Weight  07/25/23 1147 (!) 172/62 98.4 F (36.9 C) Oral 71 20 96 % 5' 7 (1.702 m) 74.3 kg  07/25/23 0903 (!) 164/57 98.2 F (36.8 C) Oral 65 18 100 % -- --  07/25/23 0328 (!) 148/56 98.4 F (36.9 C) Oral 69 16 96 % -- --  07/24/23 2231 (!) 150/52 -- -- 73 -- -- -- --  07/24/23 2227 (!) 150/52 98.3 F (36.8  C) Oral 73 20 96 % -- --      PHYSICAL EXAM:  General: Alert, cooperative, no distress, appears stated age.  Lungs: b/l air entry- few crepts Heart: Regular rate and rhythm, no murmur, rub or gallop. Abdomen: Soft, non-tender,not distended. Bowel sounds normal. No masses Extremities: atraumatic, no cyanosis. No edema. No clubbing Skin: No rashes or lesions. Or bruising Lymph: Cervical, supraclavicular normal. Neurologic: Grossly non-focal  Lab Results    Latest Ref Rng & Units 07/25/2023    6:06 AM 07/23/2023    6:14 AM 07/22/2023    5:05 AM  CBC  WBC 4.0 - 10.5 K/uL 8.6  6.9  6.5   Hemoglobin 12.0 - 15.0 g/dL 7.7  8.0  8.2   Hematocrit 36.0 - 46.0 % 24.1  24.3  23.8   Platelets 150 - 400 K/uL 306  228  187        Latest Ref Rng & Units 07/25/2023    6:06 AM 07/24/2023    5:47 AM 07/23/2023    6:14 AM  CMP  Glucose 70 - 99 mg/dL 881   876   BUN 8 - 23 mg/dL 48   53  Creatinine 0.44 - 1.00 mg/dL 7.03  7.22  7.16   Sodium 135 - 145 mmol/L 136   133   Potassium 3.5 - 5.1 mmol/L 3.8   4.0   Chloride 98 - 111 mmol/L 108   107   CO2 22 - 32 mmol/L 21   16   Calcium  8.9 - 10.3 mg/dL 8.7   8.8       Microbiology: BC  2/1 MRSA 3/4  2/4 BC- NG 2/6 BC- pending  Studies/Results: MR SACRUM SI JOINTS WO CONTRAST Result Date: 07/25/2023 CLINICAL DATA:  Lower back pain.  Infection suspected. EXAM: MRI SACRUM WITHOUT CONTRAST TECHNIQUE: Multiplanar multi-sequence MR imaging of the sacrum was performed. No intravenous contrast was administered. COMPARISON:  PET-CT 06/17/2023, PET-CT 02/03/2023, CT abdomen and pelvis 11/09/2021, MRI lumbar spine 01/13/2021 PET-CT 01/15/2023 FINDINGS: Urinary Tract: Atrophy fluid is seen within the pelvis posterior to the urinary bladder. No focal urinary bladder wall thickening is seen within the visualized portion of the urinary bladder. Bowel:  Unremarkable visualized pelvic bowel loops. Vascular/Lymphatic: No pathologically enlarged lymph nodes. No significant  vascular abnormality seen. Reproductive:  The uterus appears to be surgically absent. Musculoskeletal: Please see contemporaneous MRI lumbar spine report. Partial lumbarization of S1. Metallic susceptibility artifact from L3 through S1 bilateral transpedicular rod and screw fusion hardware. Grade 1 anterolisthesis of L5 on S1. Decreased T1 and increased T2 signal lesion measuring 11 x 8 x 10 mm (transverse by AP by craniocaudal) within the left aspect of the S3 vertebral body (sagittal series image 21; axial series 3 and series 4, image 25; coronal series 7, image 17)). This demonstrates mildly increased uptake on PET-CT 06/17/2023 that appears slightly increased from uptake on 02/03/2023 PET-CT. This appears new from MRI lumbar spine 01/13/2021. It is difficult to exclude a bone metastasis. There is no soft tissue ulcer or cortical erosion seen to suggest acute osteomyelitis. IMPRESSION: 1. No evidence of acute osteomyelitis. 2. Decreased T1 and increased T2 signal lesion measuring 11 x 8 x 10 mm within the left aspect of the S3 vertebral body. This demonstrates mildly increased uptake on PET-CT 06/17/2023 that appears slightly increased from uptake on 02/03/2023 PET-CT. This appears new from MRI lumbar spine 01/13/2021. It is difficult to exclude a bone metastasis. Electronically Signed   By: Tanda Lyons M.D.   On: 07/25/2023 12:39   CT CHEST WO CONTRAST Result Date: 07/24/2023 CLINICAL DATA:  Sepsis EXAM: CT CHEST WITHOUT CONTRAST TECHNIQUE: Multidetector CT imaging of the chest was performed following the standard protocol without IV contrast. RADIATION DOSE REDUCTION: This exam was performed according to the departmental dose-optimization program which includes automated exposure control, adjustment of the mA and/or kV according to patient size and/or use of iterative reconstruction technique. COMPARISON:  PET CT 06/17/2023, CT chest 01/16/2023 FINDINGS: Cardiovascular: Normal heart size. No significant  pericardial effusion. The thoracic aorta is normal in caliber. Severe atherosclerotic plaque of the thoracic aorta. Four-vessel coronary artery calcifications. Mediastinum/Nodes: No gross hilar adenopathy, noting limited sensitivity for the detection of hilar adenopathy on this noncontrast study. Interval development of a borderline enlarged 1 cm precarinal lymph node (2:67). Borderline enlarged right paratracheal lymph node measuring 1.1 cm (2:61). No no enlarged axillary lymph nodes. Thyroid gland, trachea, and esophagus demonstrate no significant findings. Lungs/Pleura: Moderate centrilobular emphysematous changes. Diffuse bronchial wall thickening. Biapical pleural/pulmonary scarring. Stable left apical 1 x 0.9 cm pulmonary nodule (4:36). No pulmonary mass. Associated passive atelectasis of the bilateral lower lobes. Interval development of peribronchovascular ground-glass  airspace opacities within the bilateral upper anterior lobes. Similar finding within the right middle lobe. Interval development of small right and trace left pleural effusions. No pneumothorax. Upper Abdomen: No acute abnormality. Musculoskeletal: No chest wall abnormality. No suspicious lytic or blastic osseous lesions. No acute displaced fracture. IMPRESSION: 1. Interval development of peribronchovascular and peripheral ground-glass airspace opacities within the bilateral upper anterior lobes. Similar finding within the right middle lobe. Finding could represent developing infection/inflammation. 2. Interval development of small right and trace left pleural effusions. 3. Interval development of borderline enlarged mediastinal lymph nodes. No gross hilar adenopathy, noting limited sensitivity for the detection of hilar adenopathy on this noncontrast study. Recommend attention on follow-up. 4. Stable left apical 1 x 0.9 cm pulmonary nodule. Low-grade adenocarcinoma not excluded. Recommend follow-up CT in 4 months or tissue sampling as noted  on PET CT 06/17/2023. 5. Aortic Atherosclerosis (ICD10-I70.0) including four-vessel coronary calcification. 6. Emphysema (ICD10-J43.9). Electronically Signed   By: Morgane  Naveau M.D.   On: 07/24/2023 19:50   MR LUMBAR SPINE WO CONTRAST Result Date: 07/24/2023 CLINICAL DATA:  Low back pain, infection suspected EXAM: MRI LUMBAR SPINE WITHOUT CONTRAST TECHNIQUE: Multiplanar, multisequence MR imaging of the lumbar spine was performed. No intravenous contrast was administered. COMPARISON:  04/29/2023 MRI lumbar spine FINDINGS: Segmentation: Transitional anatomy at S1, which is partially lumbarized. The last disc space is labeled S1-S2. Alignment: 4 mm anterolisthesis of L4 on L5 and 7 mm anterolisthesis of L5 on S1, which are both fused. Dextrocurvature. Vertebrae: Mildly increased T2 signal of L3, L4, and L5, which is improved compared to 04/29/2023. Otherwise normal marrow signal. No acute fracture, evidence of discitis, or suspicious osseous lesion. Conus medullaris and cauda equina: Conus extends to the L1-L2 level. Conus and cauda equina appear normal. Paraspinal and other soft tissues: Increased T2 signal is noted in paraspinous musculature bilaterally (series 7, images 8 and 13), which appears more advanced than on the prior study, possibly denervation edema. Redemonstrated trace fluid collection in the more superficial soft tissues (series 8, image 13 and series 5, image 10). Disc levels: T12-L1: No significant disc bulge. No spinal canal stenosis or neural foraminal narrowing. L1-L2: No significant disc bulge. No spinal canal stenosis or neural foraminal narrowing. L2-L3: Mild disc bulge. Mild facet arthropathy. Narrowing of the left lateral recess. No spinal canal stenosis. Mild left neural foraminal narrowing. L3-L4: Status post fusion and decompression. No significant spinal canal stenosis or neural foraminal narrowing. L4-L5: Fused anterolisthesis. Status post fusion and decompression. No spinal canal  stenosis. Mild moderate left neural foraminal narrowing. L5-S1: Fused anterolisthesis. Status post fusion and decompression. No spinal canal stenosis or neural foraminal narrowing. IMPRESSION: 1. Status post fusion and decompression at L3-L4, L4-L5, and L5-S1. No spinal canal stenosis at these levels. Mild-to-moderate left neural foraminal narrowing at L4-L5. 2. L2-L3 mild left neural foraminal narrowing. Narrowing of the left lateral recess at this level could affect the descending left L3 nerve roots. 3. Increased T2 signal in paraspinous musculature bilaterally, which appears more advanced than on the prior study, possibly denervation edema or myositis. 4. No evidence of discitis or osteomyelitis. Electronically Signed   By: Donald Campion M.D.   On: 07/24/2023 17:47     Assessment/Plan: Patient presenting with cough, fever, shortness of breath of 3 days duration   Community-acquired pneumonia on azithromycin  and ceftriaxone -  completed .  Chest x-ray showed bilateral interstitial infiltrates more on the right side COVID, flu, RSV negative  respiratory viral PCR panel NEg CT of  the chest showed emphysema, b/l upper lobe peribronchial GGO , bronchial wall thickening   MRSA bacteremia 3 out of 4 bottle Became positive after 48 hours repeat blood culture on 07/22/23 even before vanco wa sstarted is negative so far  MRSA nares neg TEE neg MRI lumbar spine - no discitis or sote Has spinal hardware S3 there is a lesion and mets have to be considered She is  on vancomycin   Daughter, patient doe snot want PICC or home IV antibiotics The options are 1) Po levaquin ( need to monitor closeley Cbc) 2) IV daptomycin  given in day surgery every 48 hrs ( but does not treat lung infection) 3) weekly Dalbavancin - with cr cl < 25 not much data in treating MRSA bacteremia Joint decision made to do Po linezolid  for 2-3 weeks- depending on how she tolerates it-  Will do CBC /CMP next Thursday Follow up  08/07/23 with me  She has a history past history of MRSA lumbar spine infection.    COPD smoker   History of stage I non-small cell carcinoma of the left upper lobe status post SBRT   AKI on CKD  Anemia   H/o lumbar spine surgeries H/o MRSA infection post surgery in Feb 2024   Diabetes mellitus on insulin   Discussed the management with patient and daughter and husband in great detail Discussed with hospitalist

## 2023-07-25 NOTE — Progress Notes (Signed)
 Progress Note   Patient: Mckenzie Lawrence FMW:[id number] DOB: [birth year] DOA: 07/19/2023     5 DOS: the patient was seen and examined on 07/25/2023   Brief hospital course: 77yo with h/o COPD, CVA, stage 4 CKD, SIADH, esophageal dysphagia treated with Botox  (last in 05/2023), DM, HLD, and HTN who presented on 2/1 with cough, SOB.  Outpatient CXR with PNA, treated x 1 dose with Amoxil and Doxy but worsened and so came in.  SIRS criteria on presentation with lactate to 2.0, concerning for sepsis.  Creatinine 3.2, up from baseline 2.7.  CXR with multifocal infiltrates concerning for infection vs. Aspiration.   Treated with Ceftriaxone /Azithromycin .  No dysphagia symptoms at home.   Refused solid food during SLP evaluation so will remain on CLD until patient is ready to advance diet (will need another SLP evaluation at that time).  2/5: 3/4 blood culture bottles with MRSA.  Patient has complicated prior history of MRSA infection involving spine, summarized as below by infectious disease.lumbar L4-L5 and L5-S1 laminotomy/foraminotomies and medial facetectomy and posterior lumbar interbody fusion in April 2023, left L3-L4 redo intervertebral discectomy in December 2023, and then January 2024 underwent L3-L4 fusion surgery with instrumentation,Which was complicated by wound drainage and was taken back to the OR on 08/08/2022 for I&D.  Infection was did not track up to the deep deep tissue and did not involve the hardware.  Culture was positive for MRSA.  She was seen by infectious disease at that time and as daughter did not want her to have IV antibiotics at home she received weekly long-acting antibiotic which was dalbavancin into 3 doses and 1 dose of oritavancin .  She was seen by Dr. Prentiss as outpatient on 09/04/2022 and was doing well with complete healing of the lumbar wound.    In August 2024 she had a PET scan for a new left upper lobe nodule which was concerning for neoplasm, no biopsy was done and the PET  scan categorized it as lung RADS 4Bs suspicious.  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  She was seen by oncologist and referred to radiation oncology for  SBRT for stage I non-small cell lung cancer of the left upper lobe and completed in October 2024.  PET scan was repeated in December 2024 and that showed no evidence of recurrent or progressive disease.   Infectious diseases on board.  Respiratory viral panel and MRSA PCR negative, repeat blood cultures negative in 24-hour, labs with persistent none anion gap metabolic acidosis, mild hyponatremia and slowly improving creatinine currently at 2.83.SABRA Echocardiogram with normal EF, indeterminate diastolic function and no obvious valvular vegetation.  2/6: Vital stable, repeat blood cultures negative in 12-hour.  Anemia panel with anemia of chronic disease.  Epogen  ordered.  TEE likely tomorrow. Completed the course of antibiotics for pneumonia. Getting lumbosacral MRI as she was having some lower back pain, also ordered CT chest as advised by ID.  2/7: Remained hemodynamically stable.  TEE negative for endocarditis.  Opioids are being discontinued at daughter's request as she was concerned of oversedation.  Likely be discharged tomorrow on linezolid    Assessment and Plan:  Sepsis due to CAP (community acquired pneumonia) vs. Aspiration PNA with MRSA PNA MRSA bacteremia. SIRS criteria include low-grade temp, tachycardia, tachypnea, leukocytosis with elevated lactate suggestive of sepsis Chest x-ray concerning for x-ray aspiration vs. CAP MRSA PCR negative Blood cultures become positive for MRSA on day 3  Repeat blood cultures negative in 2 days. TTE and TEE both are negative for endocarditis MRI of lumbar and sacral spine was negative for any osteomyelitis or discitis, as mild concern of paraspinal inflammation versus myositis. Completed a course of antibiotics for pneumonia ID  switched her vancomycin  with linezolid -plan is for 2 weeks of linezolid  on discharge.  Esophageal dysphagia Treated with Botox  via EGD, last treatment 05/2023 Aspiration precautions Repeat SLP evaluation led to regular diet She will need outpatient f/u with Dr. Rosalie due to concern for persistent/silent aspiration   Acute renal failure superimposed on stage 4 chronic kidney disease  Slowly improving creatinine, currently at 2.83 with baseline of 2.6-2.7. Avoid nephrotoxins, renally dose meds Monitor renal function  Normocytic Anemia: Slowly worsening Hgb, at 7.7 today. History of CKD.  Anemia panel consistent with anemia of chronic disease -Epogen  received yesterday -Monitor hemoglobin -Transfuse below 7  CAD (coronary artery disease) No complaints of chest pain Continue rosuvastatin , metoprolol , losartan  and aspirin    Hypertensive urgency SBP over 200 on arrival, elevated at 170 today Continue home metoprolol   Increased the dose of amlodipine  to 10 mg daily Restart home losartan  Hydralazine  IV as needed for additional control   OSA (obstructive sleep apnea) CPAP nightly   SIADH (syndrome of inappropriate ADH production)  Mild hyponatremia of 131 with known SIADH on presentation Sodium at 133 today   COPD (chronic obstructive pulmonary disease)  Not acutely exacerbated DuoNebs as needed   Type 2 diabetes mellitus with hyperlipidemia  CBG within goal Sliding scale coverage   DNR Discussed with patient/daughter and she wants to be DNR Code status changed accordingly       Consultants: SLP PT OT TOC team   Procedures: None   Antibiotics: Ceftriaxone  2/1-5 Azithromycin  2/1-5 Vancomycin  2/4   30 Day Unplanned Readmission Risk Score    Flowsheet Row ED to Hosp-Admission (Current) from 07/19/2023 in [facility]  30 Day Unplanned Readmission Risk Score (%) 25.04 Filed at 07/22/2023 0802       This score is the patient's  risk of an unplanned readmission within 30 days of being discharged (0 -100%). The score is based on dignosis, age, lab data, medications, orders, and past utilization.   Low:  0-14.9   Medium: 15-21.9   High: 22-29.9   Extreme: 30 and above           Subjective: Patient was seen and examined today.  She was feeling hungry and awaiting TEE.  Daughter at bedside who was concerned that patient was given too much pain medications yesterday and she was quite sedated.  Sedating pain medications were discontinued at her request.  Objective: Vitals:   07/25/23 1438 07/25/23 1507  BP: (!) 157/61 (!) 153/50  Pulse: 69 72  Resp: 19 18  Temp: 98.6 F (37 C) 98.8 F (37.1 C)  SpO2: 97% 99%    Intake/Output Summary (Last 24 hours) at 07/25/2023 1557 Last data filed at 07/24/2023 1605 Gross per 24 hour  Intake 0 ml  Output --  Net 0 ml   Filed Weights   07/21/23 0520 07/25/23 1147  Weight: 74.3 kg 74.3 kg    Exam:  General.  Well-developed lady, in no acute distress. Pulmonary.  Lungs clear bilaterally, normal respiratory effort. CV.  Regular rate and rhythm, no JVD, rub or murmur. Abdomen.  Soft, nontender, nondistended, BS positive. CNS.  Alert and oriented .  No focal neurologic deficit. Extremities.  No edema, no cyanosis, pulses intact and  symmetrical. Psychiatry.  Judgment and insight appears normal.    Data Reviewed: Prior data reviewed  Family Communication: Discussed with daughter at bedside  Disposition: Status is: Inpatient Remains inpatient appropriate because: bacteremia  DVT prophylaxis: Heparin   Unresulted Labs (From admission, onward)     Start     Ordered   07/24/23 0805  MIC (1 Drug)-blood culture - site unspecified; 07/19/2023; BLOOD; MRSA; Daptomycin   Once,   R       Question Answer Comment  Original Culture Order (Name): blood culture - site unspecified   Collection Date 07/19/2023   Collection Time 8:40 PM   Culture Source BLOOD   Organism MRSA    Antibiotic 1: Daptomycin       07/24/23 0806   07/23/23 1646  Expectorated Sputum Assessment w Gram Stain, Rflx to Resp Cult  Once,   R        07/23/23 1645            Time spent: 50 minutes  This record has been created using Conservation officer, historic buildings. Errors have been sought and corrected,but may not always be located. Such creation errors do not reflect on the standard of care.   Author: Amaryllis Dare, MD 07/25/2023 3:57 PM  For on call review [email or web address].

## 2023-07-25 NOTE — Anesthesia Postprocedure Evaluation (Signed)
 Anesthesia Post Note  Patient: Ronal HERO Mcelhinney  Procedure(s) Performed: TRANSESOPHAGEAL ECHOCARDIOGRAM (TEE)  Patient location during evaluation: Specials Recovery Anesthesia Type: General Level of consciousness: awake and alert Pain management: pain level controlled Vital Signs Assessment: post-procedure vital signs reviewed and stable Respiratory status: spontaneous breathing, nonlabored ventilation, respiratory function stable and patient connected to nasal cannula oxygen Cardiovascular status: blood pressure returned to baseline and stable Postop Assessment: no apparent nausea or vomiting Anesthetic complications: no  No notable events documented.   Last Vitals:  Vitals:   07/25/23 1308 07/25/23 1309  BP:    Pulse: 68 66  Resp:    Temp:    SpO2: 100% 99%    Last Pain:  Vitals:   07/25/23 1147  TempSrc: Oral  PainSc: 0-No pain                 Debby Mines

## 2023-07-25 NOTE — Telephone Encounter (Signed)
 Patient Product/process Development Scientist completed.    The patient is insured through [employer]/ [employer]. Patient has Medicare and is not eligible for a copay card, but may be able to apply for patient assistance or Medicare RX Payment Plan (Patient Must reach out to their plan, if eligible for payment plan), if available.    Ran test claim for linezolid  (Zyvox ) 600 mg and the current 14 day co-pay is $100.00.   This test claim was processed through [facility]- copay amounts may vary at other pharmacies due to pharmacy/plan contracts, or as the patient moves through the different stages of their insurance plan.     Reyes Sharps, CPHT Pharmacy Technician III Certified Patient Advocate [facility] Patient Advocate Team Direct Number: [phone]  Fax: [phone]

## 2023-07-25 NOTE — Plan of Care (Signed)
  Problem: Education: Goal: Ability to describe self-care measures that may prevent or decrease complications (Diabetes Survival Skills Education) will improve Outcome: Progressing   Problem: Coping: Goal: Ability to adjust to condition or change in health will improve Outcome: Progressing   Problem: Metabolic: Goal: Ability to maintain appropriate glucose levels will improve Outcome: Progressing   Problem: Skin Integrity: Goal: Risk for impaired skin integrity will decrease Outcome: Progressing   Problem: Respiratory: Goal: Ability to maintain adequate ventilation will improve Outcome: Progressing   Problem: Education: Goal: Knowledge of General Education information will improve Description: Including pain rating scale, medication(s)/side effects and non-pharmacologic comfort measures Outcome: Progressing   Problem: Activity: Goal: Risk for activity intolerance will decrease Outcome: Progressing   Problem: Nutrition: Goal: Adequate nutrition will be maintained Outcome: Progressing   Problem: Coping: Goal: Level of anxiety will decrease Outcome: Progressing   Problem: Elimination: Goal: Will not experience complications related to bowel motility Outcome: Progressing Goal: Will not experience complications related to urinary retention Outcome: Progressing   Problem: Pain Managment: Goal: General experience of comfort will improve and/or be controlled Outcome: Progressing   Problem: Safety: Goal: Ability to remain free from injury will improve Outcome: Progressing   Problem: Skin Integrity: Goal: Risk for impaired skin integrity will decrease Outcome: Progressing

## 2023-07-25 NOTE — Progress Notes (Signed)
 [facility]  ROUNDING NOTE   Subjective:   Mckenzie Lawrence is a 78 y.o. female with past medical history of CVA, SIADH, diabetes, hypertension, hyperlipidemia, COPD and chronic kidney disease stage 4. Patient presents to the hospital with fever and cough and has been admitted for CAP (community acquired pneumonia) [J18.9] AKI (acute kidney injury) (HCC) [N17.9] Sepsis due to pneumonia (HCC) [J18.9, A41.9] Pneumonia due to infectious organism, unspecified laterality, unspecified part of lung [J18.9] Sepsis, due to unspecified organism, unspecified whether acute organ dysfunction present [facility]) [A41.9]  Patient is known to our practice and is followed by Dr Marcelino. Last appt was on 06/05/23.   Patient seen laying in bed Alert and oriented N.p.o. for TEE   Objective:  Vital signs in last 24 hours:  Temp:  [98.2 F (36.8 C)-98.4 F (36.9 C)] 98.2 F (36.8 C) (02/07 0903) Pulse Rate:  [65-73] 65 (02/07 0903) Resp:  [16-20] 18 (02/07 0903) BP: (148-164)/(52-57) 164/57 (02/07 0903) SpO2:  [96 %-100 %] 100 % (02/07 0903)  Weight change:  Filed Weights   07/21/23 0520  Weight: 74.3 kg    Intake/Output: I/O last 3 completed shifts: In: 240 [P.O.:240] Out: -    Intake/Output this shift:  No intake/output data recorded.  Physical Exam: General: NAD  Head: Normocephalic, atraumatic. Moist oral mucosal membranes  Eyes: Anicteric  Lungs:  Clear to auscultation, normal effort  Heart: Regular rate and rhythm  Abdomen:  Soft, nontender,   Extremities:  No peripheral edema.  Neurologic: Alert, moving all four extremities  Skin: No lesions       Basic Metabolic Panel: Recent Labs  Lab 07/20/23 1004 07/21/23 0409 07/22/23 0505 07/23/23 0614 07/24/23 0547 07/25/23 0606  NA 134* 135 133* 133*  --  136  K 4.3 4.1 3.9 4.0  --  3.8  CL 107 107 107 107  --  108  CO2 16* 19* 16* 16*  --  21*  GLUCOSE 143* 171* 162* 123*  --  118*  BUN 54* 59* 56* 53*  --  48*   CREATININE 3.01* 3.15* 2.96* 2.83* 2.77* 2.96*  CALCIUM  8.8* 8.7* 8.7* 8.8*  --  8.7*    Liver Function Tests: Recent Labs  Lab 07/19/23 2040  AST 24  ALT 14  ALKPHOS 88  BILITOT 0.6  PROT 6.6  ALBUMIN 2.8*   No results for input(s): LIPASE, AMYLASE in the last 168 hours. No results for input(s): AMMONIA in the last 168 hours.  CBC: Recent Labs  Lab 07/19/23 2040 07/20/23 1004 07/21/23 0409 07/22/23 0505 07/23/23 0614 07/25/23 0606  WBC 12.9* 7.7 7.7 6.5 6.9 8.6  NEUTROABS 11.4* 6.1 5.9 4.9 4.6  --   HGB 9.0* 9.3* 8.4* 8.2* 8.0* 7.7*  HCT 27.2* 28.6* 25.8* 23.8* 24.3* 24.1*  MCV 96.1 97.9 97.7 94.1 95.7 98.0  PLT 207 195 193 187 228 306    Cardiac Enzymes: No results for input(s): CKTOTAL, CKMB, CKMBINDEX, TROPONINI in the last 168 hours.  BNP: Invalid input(s): POCBNP  CBG: Recent Labs  Lab 07/24/23 0822 07/24/23 1342 07/24/23 1842 07/24/23 2127 07/25/23 0859  GLUCAP 135* 137* 158* 119* 133*    Microbiology: Results for orders placed or performed during the hospital encounter of 07/19/23  Resp panel by RT-PCR (RSV, Flu A&B, Covid) Anterior Nasal Swab     Status: None   Collection Time: 07/19/23  8:40 PM   Specimen: Anterior Nasal Swab  Result Value Ref Range Status   SARS Coronavirus 2 by  RT PCR NEGATIVE NEGATIVE Final    Comment: (NOTE) SARS-CoV-2 target nucleic acids are NOT DETECTED.  The SARS-CoV-2 RNA is generally detectable in upper respiratory specimens during the acute phase of infection. The lowest concentration of SARS-CoV-2 viral copies this assay can detect is 138 copies/mL. A negative result does not preclude SARS-Cov-2 infection and should not be used as the sole basis for treatment or other patient management decisions. A negative result may occur with  improper specimen collection/handling, submission of specimen other than nasopharyngeal swab, presence of viral mutation(s) within the areas targeted by this  assay, and inadequate number of viral copies(<138 copies/mL). A negative result must be combined with clinical observations, patient history, and epidemiological information. The expected result is Negative.  Fact Sheet for Patients:  bloggercourse.com  Fact Sheet for Healthcare Providers:  seriousbroker.it  This test is no t yet approved or cleared by the [place]  FDA and  has been authorized for detection and/or diagnosis of SARS-CoV-2 by FDA under an Emergency Use Authorization (EUA). This EUA will remain  in effect (meaning this test can be used) for the duration of the COVID-19 declaration under Section 564(b)(1) of the Act, 21 U.S.C.section 360bbb-3(b)(1), unless the authorization is terminated  or revoked sooner.       Influenza A by PCR NEGATIVE NEGATIVE Final   Influenza B by PCR NEGATIVE NEGATIVE Final    Comment: (NOTE) The Xpert Xpress SARS-CoV-2/FLU/RSV plus assay is intended as an aid in the diagnosis of influenza from Nasopharyngeal swab specimens and should not be used as a sole basis for treatment. Nasal washings and aspirates are unacceptable for Xpert Xpress SARS-CoV-2/FLU/RSV testing.  Fact Sheet for Patients: bloggercourse.com  Fact Sheet for Healthcare Providers: seriousbroker.it  This test is not yet approved or cleared by the [place]  FDA and has been authorized for detection and/or diagnosis of SARS-CoV-2 by FDA under an Emergency Use Authorization (EUA). This EUA will remain in effect (meaning this test can be used) for the duration of the COVID-19 declaration under Section 564(b)(1) of the Act, 21 U.S.C. section 360bbb-3(b)(1), unless the authorization is terminated or revoked.     Resp Syncytial Virus by PCR NEGATIVE NEGATIVE Final    Comment: (NOTE) Fact Sheet for Patients: bloggercourse.com  Fact Sheet for  Healthcare Providers: seriousbroker.it  This test is not yet approved or cleared by the [place]  FDA and has been authorized for detection and/or diagnosis of SARS-CoV-2 by FDA under an Emergency Use Authorization (EUA). This EUA will remain in effect (meaning this test can be used) for the duration of the COVID-19 declaration under Section 564(b)(1) of the Act, 21 U.S.C. section 360bbb-3(b)(1), unless the authorization is terminated or revoked.  Performed at [facility], [address] [place], [place] [address]   Blood Culture (routine x 2)     Status: Abnormal   Collection Time: 07/19/23  8:40 PM   Specimen: BLOOD  Result Value Ref Range Status   Specimen Description   Final    BLOOD SITE NOT SPECIFIED Performed at [facility], [address], [place], [place] [address]    Special Requests   Final    BOTTLES DRAWN AEROBIC AND ANAEROBIC Blood Culture results may not be optimal due to an inadequate volume of blood received in culture bottles Performed at [facility], [address] [place], [place] [address]    Culture  Setup Time   Final    GRAM POSITIVE COCCI IN BOTH AEROBIC AND ANAEROBIC  BOTTLES CRITICAL VALUE NOTED.  VALUE IS CONSISTENT WITH PREVIOUSLY REPORTED AND CALLED VALUE. GRAM STAIN REVIEWED-AGREE WITH RESULT Performed at [facility], [address] [place], [place] [address]    Culture (A)  Final    STAPHYLOCOCCUS AUREUS SUSCEPTIBILITIES PERFORMED ON PREVIOUS CULTURE WITHIN THE LAST 5 DAYS. Performed at [facility], [address] [place], [place] [address]    Report Status 07/25/2023 FINAL  Final  Blood Culture (routine x 2)     Status: Abnormal (Preliminary result)   Collection Time: 07/19/23  8:40 PM   Specimen: BLOOD  Result Value Ref Range Status   Specimen Description   Final    BLOOD SITE NOT SPECIFIED Performed at [facility], [address], [place], [place]  [address]    Special Requests   Final    BOTTLES DRAWN AEROBIC AND ANAEROBIC Blood Culture results may not be optimal due to an inadequate volume of blood received in culture bottles Performed at [facility], [address] [place], [place] [address]    Culture  Setup Time   Final    GRAM POSITIVE COCCI IN BOTH AEROBIC AND ANAEROBIC BOTTLES CRITICAL RESULT CALLED TO, READ BACK BY AND VERIFIED WITH:  NATHAN BELUE AT 0541 07/22/23 JG GRAM STAIN REVIEWED-AGREE WITH RESULT    Culture (A)  Final    METHICILLIN RESISTANT STAPHYLOCOCCUS AUREUS Sent to [facility] for further susceptibility testing. Performed at [facility], [address] [place], [place] [address]    Report Status PENDING  Incomplete   Organism ID, Bacteria METHICILLIN RESISTANT STAPHYLOCOCCUS AUREUS  Final      Susceptibility   Methicillin resistant staphylococcus aureus - MIC*    CIPROFLOXACIN  >=8 RESISTANT Resistant     ERYTHROMYCIN  >=8 RESISTANT Resistant     GENTAMICIN  <=0.5 SENSITIVE Sensitive     OXACILLIN >=4 RESISTANT Resistant     TETRACYCLINE <=1 SENSITIVE Sensitive     VANCOMYCIN  1 SENSITIVE Sensitive     TRIMETH/SULFA <=10 SENSITIVE Sensitive     CLINDAMYCIN <=0.25 SENSITIVE Sensitive     RIFAMPIN <=0.5 SENSITIVE Sensitive     Inducible Clindamycin NEGATIVE Sensitive     LINEZOLID  2 SENSITIVE Sensitive     * METHICILLIN RESISTANT STAPHYLOCOCCUS AUREUS  Blood Culture ID Panel (Reflexed)     Status: Abnormal   Collection Time: 07/19/23  8:40 PM  Result Value Ref Range Status   Enterococcus faecalis NOT DETECTED NOT DETECTED Final   Enterococcus Faecium NOT DETECTED NOT DETECTED Final   Listeria monocytogenes NOT DETECTED NOT DETECTED Final   Staphylococcus species DETECTED (A) NOT DETECTED Final    Comment: CRITICAL RESULT CALLED TO, READ BACK BY AND VERIFIED WITH:  NATHAN BELUE AT 0541 07/22/23 JG    Staphylococcus aureus (BCID) DETECTED (A) NOT DETECTED Final    Comment: Methicillin  (oxacillin)-resistant Staphylococcus aureus (MRSA). MRSA is predictably resistant to beta-lactam antibiotics (except ceftaroline). Preferred therapy is vancomycin  unless clinically contraindicated. Patient requires contact precautions if  hospitalized. CRITICAL RESULT CALLED TO, READ BACK BY AND VERIFIED WITH:  NATHAN BELUE AT 0541 07/22/23 JG    Staphylococcus epidermidis NOT DETECTED NOT DETECTED Final   Staphylococcus lugdunensis NOT DETECTED NOT DETECTED Final   Streptococcus species NOT DETECTED NOT DETECTED Final   Streptococcus agalactiae NOT DETECTED NOT DETECTED Final   Streptococcus pneumoniae NOT DETECTED NOT DETECTED Final   Streptococcus pyogenes NOT DETECTED NOT DETECTED Final   A.calcoaceticus-baumannii NOT DETECTED NOT DETECTED Final   Bacteroides fragilis NOT  DETECTED NOT DETECTED Final   Enterobacterales NOT DETECTED NOT DETECTED Final   Enterobacter cloacae complex NOT DETECTED NOT DETECTED Final   Escherichia coli NOT DETECTED NOT DETECTED Final   Klebsiella aerogenes NOT DETECTED NOT DETECTED Final   Klebsiella oxytoca NOT DETECTED NOT DETECTED Final   Klebsiella pneumoniae NOT DETECTED NOT DETECTED Final   Proteus species NOT DETECTED NOT DETECTED Final   Salmonella species NOT DETECTED NOT DETECTED Final   Serratia marcescens NOT DETECTED NOT DETECTED Final   Haemophilus influenzae NOT DETECTED NOT DETECTED Final   Neisseria meningitidis NOT DETECTED NOT DETECTED Final   Pseudomonas aeruginosa NOT DETECTED NOT DETECTED Final   Stenotrophomonas maltophilia NOT DETECTED NOT DETECTED Final   Candida albicans NOT DETECTED NOT DETECTED Final   Candida auris NOT DETECTED NOT DETECTED Final   Candida glabrata NOT DETECTED NOT DETECTED Final   Candida krusei NOT DETECTED NOT DETECTED Final   Candida parapsilosis NOT DETECTED NOT DETECTED Final   Candida tropicalis NOT DETECTED NOT DETECTED Final   Cryptococcus neoformans/gattii NOT DETECTED NOT DETECTED Final   Meth  resistant mecA/C and MREJ DETECTED (A) NOT DETECTED Final    Comment: CRITICAL RESULT CALLED TO, READ BACK BY AND VERIFIED WITHBETHA RANKIN DILLS AT 0541 07/22/23 JG Performed at [facility], [address] [place], [place] [address]   Culture, blood (Routine X 2) w Reflex to ID Panel     Status: None (Preliminary result)   Collection Time: 07/22/23  9:40 AM   Specimen: BLOOD  Result Value Ref Range Status   Specimen Description BLOOD RIGHT ANTECUBITAL  Final   Special Requests   Final    BOTTLES DRAWN AEROBIC AND ANAEROBIC Blood Culture adequate volume   Culture   Final    NO GROWTH 3 DAYS Performed at [facility], [address] [place], [place] [address]    Report Status PENDING  Incomplete  Culture, blood (Routine X 2) w Reflex to ID Panel     Status: None (Preliminary result)   Collection Time: 07/22/23  9:40 AM   Specimen: BLOOD  Result Value Ref Range Status   Specimen Description BLOOD BLOOD LEFT HAND  Final   Special Requests   Final    BOTTLES DRAWN AEROBIC AND ANAEROBIC Blood Culture adequate volume   Culture   Final    NO GROWTH 3 DAYS Performed at [facility], [address] [place], [place] [address]    Report Status PENDING  Incomplete  MRSA Next Gen by PCR, Nasal     Status: None   Collection Time: 07/22/23  4:50 PM   Specimen: Nasal Mucosa; Nasal Swab  Result Value Ref Range Status   MRSA by PCR Next Gen NOT DETECTED NOT DETECTED Final    Comment: (NOTE) The GeneXpert MRSA Assay (FDA approved for NASAL specimens only), is one component of a comprehensive MRSA colonization surveillance program. It is not intended to diagnose MRSA infection nor to guide or monitor treatment for MRSA infections. Test performance is not FDA approved in patients less than 30 years old. Performed at [facility], [address] [place], [place] [address]   Respiratory (~20 pathogens) panel by PCR     Status: None   Collection Time:  07/22/23  4:50 PM   Specimen: Nasopharyngeal Swab; Respiratory  Result Value Ref Range Status   Adenovirus NOT DETECTED NOT DETECTED Final   Coronavirus 229E NOT DETECTED NOT DETECTED Final    Comment: (NOTE) The Coronavirus  on the Respiratory Panel, DOES NOT test for the novel  Coronavirus (2019 nCoV)    Coronavirus HKU1 NOT DETECTED NOT DETECTED Final   Coronavirus NL63 NOT DETECTED NOT DETECTED Final   Coronavirus OC43 NOT DETECTED NOT DETECTED Final   Metapneumovirus NOT DETECTED NOT DETECTED Final   Rhinovirus / Enterovirus NOT DETECTED NOT DETECTED Final   Influenza A NOT DETECTED NOT DETECTED Final   Influenza B NOT DETECTED NOT DETECTED Final   Parainfluenza Virus 1 NOT DETECTED NOT DETECTED Final   Parainfluenza Virus 2 NOT DETECTED NOT DETECTED Final   Parainfluenza Virus 3 NOT DETECTED NOT DETECTED Final   Parainfluenza Virus 4 NOT DETECTED NOT DETECTED Final   Respiratory Syncytial Virus NOT DETECTED NOT DETECTED Final   Bordetella pertussis NOT DETECTED NOT DETECTED Final   Bordetella Parapertussis NOT DETECTED NOT DETECTED Final   Chlamydophila pneumoniae NOT DETECTED NOT DETECTED Final   Mycoplasma pneumoniae NOT DETECTED NOT DETECTED Final    Comment: Performed at [facility], [address], [place], [place] [address]  Culture, blood (Routine X 2) w Reflex to ID Panel     Status: None (Preliminary result)   Collection Time: 07/24/23  5:47 AM   Specimen: BLOOD  Result Value Ref Range Status   Specimen Description BLOOD BLOOD RIGHT ARM  Final   Special Requests   Final    BOTTLES DRAWN AEROBIC AND ANAEROBIC Blood Culture adequate volume   Culture   Final    NO GROWTH 1 DAY Performed at [facility], [address], [place], [place] [address]    Report Status PENDING  Incomplete  Culture, blood (Routine X 2) w Reflex to ID Panel     Status: None (Preliminary result)   Collection Time: 07/24/23  5:47 AM   Specimen: BLOOD  Result Value Ref Range  Status   Specimen Description BLOOD BLOOD LEFT ARM  Final   Special Requests   Final    BOTTLES DRAWN AEROBIC AND ANAEROBIC Blood Culture adequate volume   Culture   Final    NO GROWTH 1 DAY Performed at [facility], [address] [place], [place] [address]    Report Status PENDING  Incomplete    Coagulation Studies: No results for input(s): LABPROT, INR in the last 72 hours.  Urinalysis: No results for input(s): COLORURINE, LABSPEC, PHURINE, GLUCOSEU, HGBUR, BILIRUBINUR, KETONESUR, PROTEINUR, UROBILINOGEN, NITRITE, LEUKOCYTESUR in the last 72 hours.  Invalid input(s): APPERANCEUR    Imaging: CT CHEST WO CONTRAST Result Date: 07/24/2023 CLINICAL DATA:  Sepsis EXAM: CT CHEST WITHOUT CONTRAST TECHNIQUE: Multidetector CT imaging of the chest was performed following the standard protocol without IV contrast. RADIATION DOSE REDUCTION: This exam was performed according to the departmental dose-optimization program which includes automated exposure control, adjustment of the mA and/or kV according to patient size and/or use of iterative reconstruction technique. COMPARISON:  PET CT 06/17/2023, CT chest 01/16/2023 FINDINGS: Cardiovascular: Normal heart size. No significant pericardial effusion. The thoracic aorta is normal in caliber. Severe atherosclerotic plaque of the thoracic aorta. Four-vessel coronary artery calcifications. Mediastinum/Nodes: No gross hilar adenopathy, noting limited sensitivity for the detection of hilar adenopathy on this noncontrast study. Interval development of a borderline enlarged 1 cm precarinal lymph node (2:67). Borderline enlarged right paratracheal lymph node measuring 1.1 cm (2:61). No no enlarged axillary lymph nodes. Thyroid gland, trachea, and esophagus demonstrate no significant findings. Lungs/Pleura: Moderate centrilobular emphysematous changes. Diffuse bronchial wall thickening. Biapical pleural/pulmonary scarring.  Stable left apical 1 x  0.9 cm pulmonary nodule (4:36). No pulmonary mass. Associated passive atelectasis of the bilateral lower lobes. Interval development of peribronchovascular ground-glass airspace opacities within the bilateral upper anterior lobes. Similar finding within the right middle lobe. Interval development of small right and trace left pleural effusions. No pneumothorax. Upper Abdomen: No acute abnormality. Musculoskeletal: No chest wall abnormality. No suspicious lytic or blastic osseous lesions. No acute displaced fracture. IMPRESSION: 1. Interval development of peribronchovascular and peripheral ground-glass airspace opacities within the bilateral upper anterior lobes. Similar finding within the right middle lobe. Finding could represent developing infection/inflammation. 2. Interval development of small right and trace left pleural effusions. 3. Interval development of borderline enlarged mediastinal lymph nodes. No gross hilar adenopathy, noting limited sensitivity for the detection of hilar adenopathy on this noncontrast study. Recommend attention on follow-up. 4. Stable left apical 1 x 0.9 cm pulmonary nodule. Low-grade adenocarcinoma not excluded. Recommend follow-up CT in 4 months or tissue sampling as noted on PET CT 06/17/2023. 5. Aortic Atherosclerosis (ICD10-I70.0) including four-vessel coronary calcification. 6. Emphysema (ICD10-J43.9). Electronically Signed   By: Morgane  Naveau M.D.   On: 07/24/2023 19:50   MR LUMBAR SPINE WO CONTRAST Result Date: 07/24/2023 CLINICAL DATA:  Low back pain, infection suspected EXAM: MRI LUMBAR SPINE WITHOUT CONTRAST TECHNIQUE: Multiplanar, multisequence MR imaging of the lumbar spine was performed. No intravenous contrast was administered. COMPARISON:  04/29/2023 MRI lumbar spine FINDINGS: Segmentation: Transitional anatomy at S1, which is partially lumbarized. The last disc space is labeled S1-S2. Alignment: 4 mm anterolisthesis of L4 on L5 and 7 mm  anterolisthesis of L5 on S1, which are both fused. Dextrocurvature. Vertebrae: Mildly increased T2 signal of L3, L4, and L5, which is improved compared to 04/29/2023. Otherwise normal marrow signal. No acute fracture, evidence of discitis, or suspicious osseous lesion. Conus medullaris and cauda equina: Conus extends to the L1-L2 level. Conus and cauda equina appear normal. Paraspinal and other soft tissues: Increased T2 signal is noted in paraspinous musculature bilaterally (series 7, images 8 and 13), which appears more advanced than on the prior study, possibly denervation edema. Redemonstrated trace fluid collection in the more superficial soft tissues (series 8, image 13 and series 5, image 10). Disc levels: T12-L1: No significant disc bulge. No spinal canal stenosis or neural foraminal narrowing. L1-L2: No significant disc bulge. No spinal canal stenosis or neural foraminal narrowing. L2-L3: Mild disc bulge. Mild facet arthropathy. Narrowing of the left lateral recess. No spinal canal stenosis. Mild left neural foraminal narrowing. L3-L4: Status post fusion and decompression. No significant spinal canal stenosis or neural foraminal narrowing. L4-L5: Fused anterolisthesis. Status post fusion and decompression. No spinal canal stenosis. Mild moderate left neural foraminal narrowing. L5-S1: Fused anterolisthesis. Status post fusion and decompression. No spinal canal stenosis or neural foraminal narrowing. IMPRESSION: 1. Status post fusion and decompression at L3-L4, L4-L5, and L5-S1. No spinal canal stenosis at these levels. Mild-to-moderate left neural foraminal narrowing at L4-L5. 2. L2-L3 mild left neural foraminal narrowing. Narrowing of the left lateral recess at this level could affect the descending left L3 nerve roots. 3. Increased T2 signal in paraspinous musculature bilaterally, which appears more advanced than on the prior study, possibly denervation edema or myositis. 4. No evidence of discitis or  osteomyelitis. Electronically Signed   By: Donald Campion M.D.   On: 07/24/2023 17:47     Medications:    lactated ringers  100 mL/hr at 07/25/23 1000   vancomycin  1,000 mg (07/24/23 1019)    amLODipine   10 mg Oral Daily  aspirin  EC  81 mg Oral QHS   epoetin  alfa-epbx (RETACRIT ) injection  4,000 Units Subcutaneous Weekly   Fe Fum-Vit C-Vit B12-FA  1 capsule Oral QPC breakfast   guaiFENesin   600 mg Oral BID   heparin  injection (subcutaneous)  5,000 Units Subcutaneous Q8H   insulin  aspart  0-15 Units Subcutaneous TID WC   insulin  aspart  0-5 Units Subcutaneous QHS   losartan   100 mg Oral QHS   metoprolol  succinate  100 mg Oral QHS   nystatin   5 mL Oral QID   rosuvastatin   5 mg Oral Daily   acetaminophen  **OR** acetaminophen , albuterol , cyclobenzaprine , docusate sodium , hydrALAZINE , hydrOXYzine , ondansetron  **OR** ondansetron  (ZOFRAN ) IV, sucralfate   Assessment/ Plan:  [name] is a 78 y.o.  female with past medical history of CVA, SIADH, diabetes, hypertension, hyperlipidemia, COPD and chronic kidney disease stage 4. Patient presents to the hospital with fever and cough and has been admitted for CAP (community acquired pneumonia) [J18.9] AKI (acute kidney injury) (HCC) [N17.9] Sepsis due to pneumonia (HCC) [J18.9, A41.9] Pneumonia due to infectious organism, unspecified laterality, unspecified part of lung [J18.9] Sepsis, due to unspecified organism, unspecified whether acute organ dysfunction present [facility]) [A41.9]   Acute Kidney Injury on chronic kidney disease stage IV with baseline creatinine 2.72 and GFR of 18 on 05/21/23.  Acute kidney injury secondary to infection CT abd pelvis negative for hydronephrosis. Creatinine on admission 3.20.   Creatinine has returned to baseline. Continue to avoid nephrotoxic agents and therapies. Will continue to monitor renal indices.   Lab Results  Component Value Date   CREATININE 2.96 (H) 07/25/2023   CREATININE 2.77 (H) 07/24/2023    CREATININE 2.83 (H) 07/23/2023    Intake/Output Summary (Last 24 hours) at 07/25/2023 1121 Last data filed at 07/24/2023 1605 Gross per 24 hour  Intake 0 ml  Output --  Net 0 ml    2. MRSA pneumonia MRSA bacteremia, CAP vs aspiration pneumonia with sepsis. Chest xray concerning for infection vs aspiration. Blood cultures negative. ID following. Vancomycin  ordered.   TEE scheduled for today  3. Anemia of chronic kidney disease Lab Results  Component Value Date   HGB 7.7 (L) 07/25/2023    Hgb below baseline, will continue to monitor.   4. Secondary Hyperparathyroidism: with outpatient labs: PTH 122, phosphorus 5.4, calcium  9.6 on 07/14/23.   Lab Results  Component Value Date   CALCIUM  8.7 (L) 07/25/2023   CAION 1.20 05/27/2023    Calcium  within optimal range. Appetite appropriate. Will continue to trend bone minerals.    LOS: 5 Surya Schroeter 2/7/202511:21 AM

## 2023-07-25 NOTE — Progress Notes (Signed)
 PT Cancellation Note  Patient Details Name: Mckenzie Lawrence MRN: [id number] DOB: 05/11/1946   Cancelled Treatment:    Reason Eval/Treat Not Completed: Patient at procedure or test/unavailable. Chart Reviewed. At time of therapist arrival, RN present in room and reports patient is being taken OTF for TEE. Will re-attempt at later date/time.    Aubrianna Orchard M Fairly, PT, DPT 07/25/23 11:35 AM

## 2023-07-25 NOTE — Progress Notes (Signed)
 Pharmacy Antibiotic Note  Mckenzie Lawrence is a 78 y.o. female admitted on 07/19/2023 with bacteremia.  Pharmacy has been consulted for vancomycin  dosing. Patient presents 2/1 with fever and cough. Prior to presentation to [facility] ED, patient went to urgent care, CXR performed and was diagnosed with pneumonia.  Patient given amoxicillin + doxycyline.  Since admission to hospital, she has been on ceftriaxone  + azithromycin . Last Feb, patient had I&D of lumbar wound and culture grew MRSA.  She has PMH of neurosurgery 10/01/2021, 05/2022 and 06/2022  Today, 07/25/2023 Day #4 vancomycin  and completed 5 days (on 2/5) of ceftriaxone /azithromycin  Renal: SCr = 2.96 - PMH CKD (baseline SCr ~2.6) WBC WNL Afebrile Respiratory virus panel: neg MRSA PCR neg 2/1 blood cultures: 4/4 GPC, BCID MRSA Took ~2.5 days for culture to become positive 2/4 blood cultures: NGTD 2/6 blood cultures: NGTD 2/5 ECHO - no vegetation  Vancomycin  dosing/levels:  2/4 vancomycin  1500mg  IV x 1 given at 0943 2/6 random vancomycin  level at 05:47 = 10 mcg/mL  Plan: Based on random level this morning and estimated clearance, start vancomycin  1gm IV q48h.  eAUC 503.5 (goal 400-600) Cmin 13.2 mcg/mL Follow renal function Check blood cultures prior to starting vancomycin  Send daptomycin  MIC to [facility] ID consult   Height: 5' 7 (170.2 cm) Weight: 74.3 kg (163 lb 12.8 oz) IBW/kg (Calculated) : 61.6  Temp (24hrs), Avg:98.3 F (36.8 C), Min:98.2 F (36.8 C), Max:98.4 F (36.9 C)  Recent Labs  Lab 07/19/23 2049 07/19/23 2209 07/20/23 1004 07/21/23 0409 07/22/23 0505 07/23/23 0614 07/24/23 0547 07/25/23 0606  WBC  --   --  7.7 7.7 6.5 6.9  --  8.6  CREATININE  --   --  3.01* 3.15* 2.96* 2.83* 2.77* 2.96*  LATICACIDVEN 1.8 2.0* 1.3  --   --   --   --   --   VANCORANDOM  --   --   --   --   --   --  10  --     Estimated Creatinine Clearance: 17.3 mL/min (A) (by C-G formula based on SCr of 2.96 mg/dL (H)).    Allergies   Allergen Reactions   Iodine Anaphylaxis and Other (See Comments)    Pt states that she is allergic to ingested iodine only, okay for betadine.     Iodine I 131 Tositumomab Anaphylaxis   Shellfish Allergy Anaphylaxis   Codeine Nausea And Vomiting   Morphine  Sulfate Nausea And Vomiting   Irbesartan Other (See Comments)     Unknown  (Avapro)   Sulfa Antibiotics Other (See Comments)    Fever      Thank you for allowing pharmacy to be a part of this patient's care.  Lurlene Ronda, PharmD, BCPS, BCIDP Work Cell: [phone] 07/25/2023 11:12 AM

## 2023-07-26 DIAGNOSIS — G4733 Obstructive sleep apnea (adult) (pediatric): Secondary | ICD-10-CM | POA: Diagnosis not present

## 2023-07-26 DIAGNOSIS — R1319 Other dysphagia: Secondary | ICD-10-CM | POA: Diagnosis not present

## 2023-07-26 DIAGNOSIS — J189 Pneumonia, unspecified organism: Secondary | ICD-10-CM | POA: Diagnosis not present

## 2023-07-26 DIAGNOSIS — N179 Acute kidney failure, unspecified: Secondary | ICD-10-CM | POA: Diagnosis not present

## 2023-07-26 LAB — CBC
HCT: 24.8 % — ABNORMAL LOW (ref 36.0–46.0)
Hemoglobin: 8 g/dL — ABNORMAL LOW (ref 12.0–15.0)
MCH: 31.3 pg (ref 26.0–34.0)
MCHC: 32.3 g/dL (ref 30.0–36.0)
MCV: 96.9 fL (ref 80.0–100.0)
Platelets: 362 10*3/uL (ref 150–400)
RBC: 2.56 MIL/uL — ABNORMAL LOW (ref 3.87–5.11)
RDW: 14.1 % (ref 11.5–15.5)
WBC: 10.1 10*3/uL (ref 4.0–10.5)
nRBC: 0 % (ref 0.0–0.2)

## 2023-07-26 LAB — GLUCOSE, CAPILLARY
Glucose-Capillary: 157 mg/dL — ABNORMAL HIGH (ref 70–99)
Glucose-Capillary: 176 mg/dL — ABNORMAL HIGH (ref 70–99)

## 2023-07-26 LAB — RENAL FUNCTION PANEL
Albumin: 2.3 g/dL — ABNORMAL LOW (ref 3.5–5.0)
Anion gap: 9 (ref 5–15)
BUN: 42 mg/dL — ABNORMAL HIGH (ref 8–23)
CO2: 20 mmol/L — ABNORMAL LOW (ref 22–32)
Calcium: 8.7 mg/dL — ABNORMAL LOW (ref 8.9–10.3)
Chloride: 107 mmol/L (ref 98–111)
Creatinine, Ser: 2.74 mg/dL — ABNORMAL HIGH (ref 0.44–1.00)
GFR, Estimated: 18 mL/min — ABNORMAL LOW (ref >60)
Glucose, Bld: 168 mg/dL — ABNORMAL HIGH (ref 70–99)
Phosphorus: 3.7 mg/dL (ref 2.5–4.6)
Potassium: 4 mmol/L (ref 3.5–5.1)
Sodium: 136 mmol/L (ref 135–145)

## 2023-07-26 MED ORDER — CYCLOBENZAPRINE HCL 10 MG PO TABS: 10.0000 mg | ORAL_TABLET | Freq: Three times a day (TID) | ORAL | 0 refills | Status: DC | PRN

## 2023-07-26 MED ORDER — SUCRALFATE 1 G PO TABS: 1.0000 g | ORAL_TABLET | Freq: Two times a day (BID) | ORAL | Status: DC | PRN

## 2023-07-26 MED ORDER — EPOETIN ALFA-EPBX 4000 UNIT/ML IJ SOLN: 4000.0000 [IU] | INTRAMUSCULAR | 3 refills | Status: DC

## 2023-07-26 MED ORDER — FE FUM-VIT C-VIT B12-FA 460-60-0.01-1 MG PO CAPS: 1.0000 | ORAL_CAPSULE | Freq: Every day | ORAL | 0 refills | Status: DC

## 2023-07-26 MED ORDER — AMLODIPINE BESYLATE 10 MG PO TABS: 10.0000 mg | ORAL_TABLET | Freq: Every day | ORAL | 2 refills | Status: DC

## 2023-07-26 MED ORDER — SODIUM BICARBONATE 650 MG PO TABS: 650.0000 mg | ORAL_TABLET | Freq: Two times a day (BID) | ORAL | 1 refills | Status: DC

## 2023-07-26 NOTE — Plan of Care (Signed)

## 2023-07-26 NOTE — Plan of Care (Signed)
  Problem: Nutritional: Goal: Maintenance of adequate nutrition will improve Outcome: Progressing   Problem: Respiratory: Goal: Ability to maintain adequate ventilation will improve Outcome: Progressing

## 2023-07-26 NOTE — Progress Notes (Signed)
 Mckenzie Lawrence to be discharged Home per MD order. Discussed prescriptions and follow up appointments with the patient. Prescriptions given to patient, medication list explained in detail. Patient verbalized understanding.  Allergies as of 07/26/2023       Reactions   Iodine Anaphylaxis, Other (See Comments)   Pt states that she is allergic to ingested iodine only, okay for betadine.     Iodine I 131 Tositumomab Anaphylaxis   Shellfish Allergy Anaphylaxis   Codeine Nausea And Vomiting   Morphine  Sulfate Nausea And Vomiting   Irbesartan Other (See Comments)    Unknown  (Avapro)   Sulfa Antibiotics Other (See Comments)   Fever         Medication List     STOP taking these medications    amoxicillin 500 MG capsule Commonly known as: AMOXIL   diazepam  5 MG tablet Commonly known as: VALIUM    doxycycline  100 MG tablet Commonly known as: ADOXA   nitrofurantoin  100 MG capsule Commonly known as: MACRODANTIN    Spikevax syringe Generic drug: COVID-19 mRNA vaccine       TAKE these medications    acetaminophen  500 MG tablet Commonly known as: TYLENOL  Take 1,000 mg by mouth every 6 (six) hours as needed for moderate pain.   albuterol  108 (90 Base) MCG/ACT inhaler Commonly known as: VENTOLIN  HFA Inhale 2 puffs into the lungs every 6 (six) hours as needed for wheezing or shortness of breath.   amLODipine  10 MG tablet Commonly known as: NORVASC  Take 1 tablet (10 mg total) by mouth daily. Start taking on: July 27, 2023 What changed:  how much to take when to take this   aspirin  EC 81 MG tablet Take 81 mg by mouth at bedtime. Swallow whole.   BD Insulin  Syringe U/F 31G X 5/16 1 ML Misc Generic drug: Insulin  Syringe-Needle U-100 USE 1 SYRINGE AS DIRECTED   calcitRIOL  0.25 MCG capsule Commonly known as: ROCALTROL  Take 0.25 mcg by mouth at bedtime.   cholecalciferol 25 MCG (1000 UNIT) tablet Commonly known as: VITAMIN D3 Take 1,000 Units by mouth daily.    cyclobenzaprine  10 MG tablet Commonly known as: FLEXERIL  Take 1 tablet (10 mg total) by mouth 3 (three) times daily as needed for muscle spasms.   D-MANNOSE PO Take 2 tablets by mouth at bedtime.   diphenhydrAMINE  25 MG tablet Commonly known as: BENADRYL  Take 50 mg by mouth at bedtime as needed for sleep.   docusate sodium  100 MG capsule Commonly known as: COLACE Take 1 capsule (100 mg total) by mouth 2 (two) times daily. What changed:  when to take this reasons to take this   epoetin  alfa-epbx 4000 UNIT/ML injection Commonly known as: RETACRIT  Inject 1 mL (4,000 Units total) into the skin once a week. Start taking on: July 31, 2023   estradiol  0.1 MG/GM vaginal cream Commonly known as: ESTRACE  Place 1 Applicatorful vaginally 3 (three) times a week.   Fe Fum-Vit C-Vit B12-FA Caps capsule Commonly known as: TRIGELS-F FORTE Take 1 capsule by mouth daily after breakfast. Start taking on: July 27, 2023   feeding supplement Liqd Take 1 Container by mouth 2 (two) times daily between meals.   FreeStyle Libre 2 Sensor Misc   Gemtesa 75 MG Tabs Generic drug: Vibegron Take 75 mg by mouth at bedtime.   insulin  detemir 100 UNIT/ML injection Commonly known as: LEVEMIR  Inject 100-120 Units into the skin daily.   linezolid  600 MG tablet Commonly known as: Zyvox  Take 1 tablet (600 mg total)  by mouth 2 (two) times daily for 21 days.   losartan  100 MG tablet Commonly known as: COZAAR  Take 1 tablet (100 mg total) by mouth daily. What changed: when to take this   metoprolol  succinate 100 MG 24 hr tablet Commonly known as: TOPROL -XL Take 100 mg by mouth at bedtime.   OneTouch Ultra test strip Generic drug: glucose blood 4 (four) times daily.   PRESERVISION AREDS 2+MULTI VIT PO Take 1 capsule by mouth daily.   rosuvastatin  5 MG tablet Commonly known as: CRESTOR  Take 5 mg by mouth daily.   sennosides-docusate sodium  8.6-50 MG tablet Commonly known as:  SENOKOT-S Take 1 tablet by mouth daily as needed for constipation.   sodium bicarbonate  650 MG tablet Take 1 tablet (650 mg total) by mouth 2 (two) times daily.   sucralfate  1 g tablet Commonly known as: CARAFATE  Take 1 tablet (1 g total) by mouth 2 (two) times daily as needed (acid reflux).   zolpidem  10 MG tablet Commonly known as: AMBIEN  Take 10 mg by mouth at bedtime as needed for sleep.        Vitals:   07/26/23 0402 07/26/23 0757  BP: (!) 142/55 (!) 154/48  Pulse: 70 66  Resp: 18 16  Temp: 98.5 F (36.9 C) 97.9 F (36.6 C)  SpO2: 96% 98%    Skin clean, dry and intact without evidence of skin break down and or skin tears. IV catheter discontinued intact. Site without signs and symptoms of complications. Dressing and pressure applied. Patient denies pain at this time. No complaints noted.  An After Visit Summary was printed and given to the patient. Patient escorted via wheelchair and discharged Home home via private auto.  Hunter JONETTA Hope, RN

## 2023-07-26 NOTE — Discharge Summary (Signed)
 Physician Discharge Summary   Patient: Mckenzie Lawrence MRN: [id number] DOB: [birth year]  Admit date:     07/19/2023  Discharge date: 07/26/23  Discharge Physician: Amaryllis Dare   PCP: [name], MD   Recommendations at discharge:  Please obtain CBC and BMP on follow-up Follow-up with primary care provider Follow-up with infectious disease Follow-up with nephrology  Discharge Diagnoses: Principal Problem:   Sepsis due to pneumonia [facility]) Active Problems:   Multifocal pneumonia   Esophageal dysphagia   CAD (coronary artery disease)   Acute renal failure superimposed on stage 4 chronic kidney disease (HCC)   Hypertensive urgency   OSA (obstructive sleep apnea)   Type 2 diabetes mellitus with hyperlipidemia (HCC)   COPD (chronic obstructive pulmonary disease) (HCC)   SIADH (syndrome of inappropriate ADH production) (HCC)   MRSA bacteremia   Pneumonia due to infectious organism   AKI (acute kidney injury) [facility])   Hospital Course: 77yo with h/o COPD, CVA, stage 4 CKD, SIADH, esophageal dysphagia treated with Botox  (last in 05/2023), DM, HLD, and HTN who presented on 2/1 with cough, SOB.  Outpatient CXR with PNA, treated x 1 dose with Amoxil and Doxy but worsened and so came in.  SIRS criteria on presentation with lactate to 2.0, concerning for sepsis.  Creatinine 3.2, up from baseline 2.7.  CXR with multifocal infiltrates concerning for infection vs. Aspiration.   Treated with Ceftriaxone /Azithromycin .  No dysphagia symptoms at home.   Refused solid food during SLP evaluation so will remain on CLD until patient is ready to advance diet (will need another SLP evaluation at that time).  2/5: 3/4 blood culture bottles with MRSA.  Patient has complicated prior history of MRSA infection involving spine, summarized as below by infectious disease.lumbar L4-L5 and L5-S1 laminotomy/foraminotomies and medial facetectomy and posterior lumbar interbody fusion in April 2023, left L3-L4 redo  intervertebral discectomy in December 2023, and then January 2024 underwent L3-L4 fusion surgery with instrumentation,Which was complicated by wound drainage and was taken back to the OR on 08/08/2022 for I&D.  Infection was did not track up to the deep deep tissue and did not involve the hardware.  Culture was positive for MRSA.  She was seen by infectious disease at that time and as daughter did not want her to have IV antibiotics at home she received weekly long-acting antibiotic which was dalbavancin into 3 doses and 1 dose of oritavancin .  She was seen by Dr. Prentiss as outpatient on 09/04/2022 and was doing well with complete healing of the lumbar wound.    In August 2024 she had a PET scan for a new left upper lobe nodule which was concerning for neoplasm, no biopsy was done and the PET scan categorized it as lung RADS 4Bs suspicious.  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  She was seen by oncologist and referred to radiation oncology for  SBRT for stage I non-small cell lung cancer of the left upper lobe and completed in October 2024.  PET scan was repeated in December 2024 and that showed no evidence of recurrent or progressive disease.   Infectious diseases on board.  Respiratory viral panel and MRSA PCR negative, repeat blood cultures negative in 24-hour, labs with persistent none anion gap metabolic acidosis, mild hyponatremia and slowly improving creatinine currently at 2.83.SABRA Echocardiogram with normal EF, indeterminate diastolic function and no obvious valvular vegetation.  2/6: Vital stable, repeat blood cultures negative in  12-hour.  Anemia panel with anemia of chronic disease.  Epogen  ordered.  TEE likely tomorrow. Completed the course of antibiotics for pneumonia. Getting lumbosacral MRI as she was having some lower back pain, also ordered CT chest as advised by ID.  2/7: Remained hemodynamically stable.  TEE negative for  endocarditis.  Opioids are being discontinued at daughter's request as she was concerned of oversedation.  Likely be discharged tomorrow on linezolid   2/8: Patient remained hemodynamically stable.  She is being discharged on 2 weeks of linezolid  as as advised by ID.  She will follow-up with them for further recommendations.  Renal function seems stable.  Patient need to follow-up with nephrology for further assistance.  She was also started on Epogen  and was given dietary supplement to help with anemia of chronic disease secondary to CKD stage IV.  Patient also has mild metabolic acidosis secondary to her renal disease so started on bicarb tablet.  Her nephrologist can follow-up and titrate the dose.  Patient will continue with rest of her home medications and need to have a close follow-up with her providers for further management.  Assessment and Plan: Multifocal pneumonia Sepsis MRSA bacteremia. SIRS criteria include low-grade temp, tachycardia, tachypnea, leukocytosis with elevated lactate suggestive of sepsis Chest x-ray concerning for x-ray aspiration vs. CAP MRSA PCR negative Blood cultures become positive for MRSA on day 3 Repeat blood cultures negative in 2 days. TTE and TEE both are negative for endocarditis MRI of lumbar and sacral spine was negative for any osteomyelitis or discitis, as mild concern of paraspinal inflammation versus myositis. Completed a course of antibiotics for pneumonia ID switched her vancomycin  with linezolid -plan is for 2 weeks of linezolid  on discharge.  Esophageal dysphagia Treated with Botox  via EGD, last treatment 08/2022 Aspiration precautions Will get SLP eval given possible aspiration pneumonia  Acute renal failure superimposed on stage 4 chronic kidney disease (HCC) Slowly improving creatinine, currently at 2.83 with baseline of 2.6-2.7. Avoid nephrotoxins, renally dose meds Monitor renal function  CAD (coronary artery disease) No  complaints of chest pain Continue rosuvastatin , metoprolol , losartan  and aspirin   Hypertensive urgency SBP over 200 on arrival Continue home metoprolol  losartan  and amlodipine  Amlodipine  dose was increased to 10 mg daily.  OSA (obstructive sleep apnea) CPAP nightly  SIADH (syndrome of inappropriate ADH production) (HCC) Improved.  COPD (chronic obstructive pulmonary disease) (HCC) Not acutely exacerbated DuoNebs as needed  Type 2 diabetes mellitus with hyperlipidemia (HCC) Continue basal insulin  Sliding scale coverage  Consultants: Infectious disease.  Nephrology Procedures performed: TEE Disposition: Home health Diet recommendation:  Discharge Diet Orders (From admission, onward)     Start     Ordered   07/26/23 0000  Diet - low sodium heart healthy        07/26/23 1047           Cardiac and Carb modified diet DISCHARGE MEDICATION: Allergies as of 07/26/2023       Reactions   Iodine Anaphylaxis, Other (See Comments)   Pt states that she is allergic to ingested iodine only, okay for betadine.     Iodine I 131 Tositumomab Anaphylaxis   Shellfish Allergy Anaphylaxis   Codeine Nausea And Vomiting   Morphine  Sulfate Nausea And Vomiting   Irbesartan Other (See Comments)    Unknown  (Avapro)   Sulfa Antibiotics Other (See Comments)   Fever         Medication List     STOP taking these medications    amoxicillin 500 MG capsule  Commonly known as: AMOXIL   diazepam  5 MG tablet Commonly known as: VALIUM    doxycycline  100 MG tablet Commonly known as: ADOXA   nitrofurantoin  100 MG capsule Commonly known as: MACRODANTIN    Spikevax syringe Generic drug: COVID-19 mRNA vaccine       TAKE these medications    acetaminophen  500 MG tablet Commonly known as: TYLENOL  Take 1,000 mg by mouth every 6 (six) hours as needed for moderate pain.   albuterol  108 (90 Base) MCG/ACT inhaler Commonly known as: VENTOLIN  HFA Inhale 2 puffs into the lungs every 6 (six)  hours as needed for wheezing or shortness of breath.   amLODipine  10 MG tablet Commonly known as: NORVASC  Take 1 tablet (10 mg total) by mouth daily. Start taking on: July 27, 2023 What changed:  how much to take when to take this   aspirin  EC 81 MG tablet Take 81 mg by mouth at bedtime. Swallow whole.   BD Insulin  Syringe U/F 31G X 5/16 1 ML Misc Generic drug: Insulin  Syringe-Needle U-100 USE 1 SYRINGE AS DIRECTED   calcitRIOL  0.25 MCG capsule Commonly known as: ROCALTROL  Take 0.25 mcg by mouth at bedtime.   cholecalciferol 25 MCG (1000 UNIT) tablet Commonly known as: VITAMIN D3 Take 1,000 Units by mouth daily.   cyclobenzaprine  10 MG tablet Commonly known as: FLEXERIL  Take 1 tablet (10 mg total) by mouth 3 (three) times daily as needed for muscle spasms.   D-MANNOSE PO Take 2 tablets by mouth at bedtime.   diphenhydrAMINE  25 MG tablet Commonly known as: BENADRYL  Take 50 mg by mouth at bedtime as needed for sleep.   docusate sodium  100 MG capsule Commonly known as: COLACE Take 1 capsule (100 mg total) by mouth 2 (two) times daily. What changed:  when to take this reasons to take this   epoetin  alfa-epbx 4000 UNIT/ML injection Commonly known as: RETACRIT  Inject 1 mL (4,000 Units total) into the skin once a week. Start taking on: July 31, 2023   estradiol  0.1 MG/GM vaginal cream Commonly known as: ESTRACE  Place 1 Applicatorful vaginally 3 (three) times a week.   Fe Fum-Vit C-Vit B12-FA Caps capsule Commonly known as: TRIGELS-F FORTE Take 1 capsule by mouth daily after breakfast. Start taking on: July 27, 2023   feeding supplement Liqd Take 1 Container by mouth 2 (two) times daily between meals.   FreeStyle Libre 2 Sensor Misc   Gemtesa 75 MG Tabs Generic drug: Vibegron Take 75 mg by mouth at bedtime.   insulin  detemir 100 UNIT/ML injection Commonly known as: LEVEMIR  Inject 100-120 Units into the skin daily.   linezolid  600 MG  tablet Commonly known as: Zyvox  Take 1 tablet (600 mg total) by mouth 2 (two) times daily for 21 days.   losartan  100 MG tablet Commonly known as: COZAAR  Take 1 tablet (100 mg total) by mouth daily. What changed: when to take this   metoprolol  succinate 100 MG 24 hr tablet Commonly known as: TOPROL -XL Take 100 mg by mouth at bedtime.   OneTouch Ultra test strip Generic drug: glucose blood 4 (four) times daily.   PRESERVISION AREDS 2+MULTI VIT PO Take 1 capsule by mouth daily.   rosuvastatin  5 MG tablet Commonly known as: CRESTOR  Take 5 mg by mouth daily.   sennosides-docusate sodium  8.6-50 MG tablet Commonly known as: SENOKOT-S Take 1 tablet by mouth daily as needed for constipation.   sodium bicarbonate  650 MG tablet Take 1 tablet (650 mg total) by mouth 2 (two) times daily.  sucralfate  1 g tablet Commonly known as: CARAFATE  Take 1 tablet (1 g total) by mouth 2 (two) times daily as needed (acid reflux).   zolpidem  10 MG tablet Commonly known as: AMBIEN  Take 10 mg by mouth at bedtime as needed for sleep.        Follow-up Information     [facility] Follow up.   Specialty: Home Health Services Why: They will follow up with you for your home health needs. Contact information: [address] [place] [address] [phone]         [name], MD. Schedule an appointment as soon as possible for a visit in 1 week(s).   Specialty: Family Medicine Contact information: [address] [facility] [place] [address] [phone]         Fayette Bodily, MD. Schedule an appointment as soon as possible for a visit in 1 week(s).   Specialty: Infectious Diseases Contact information: [address] [place] [place] [address] [phone]                Discharge Exam: Fredricka Weights   07/21/23 0520 07/25/23 1147  Weight: 74.3 kg 74.3 kg   General.  Well-developed lady, in no acute distress. Pulmonary.   Lungs clear bilaterally, normal respiratory effort. CV.  Regular rate and rhythm, no JVD, rub or murmur. Abdomen.  Soft, nontender, nondistended, BS positive. CNS.  Alert and oriented .  No focal neurologic deficit. Extremities.  No edema, no cyanosis, pulses intact and symmetrical. Psychiatry.  Judgment and insight appears normal.   Condition at discharge: stable  The results of significant diagnostics from this hospitalization (including imaging, microbiology, ancillary and laboratory) are listed below for reference.   Imaging Studies: ECHO TEE Result Date: 07/25/2023    TRANSESOPHOGEAL ECHO REPORT   Patient Name:   Mckenzie Lawrence Date of Exam: 07/25/2023 Medical Rec #:  [id number]      Height:       67.0 in Accession #:    [id number]     Weight:       163.8 lb Date of Birth:  Jan 17, 1946      BSA:          1.858 m Patient Age:    77 years       BP:           164/57 mmHg Patient Gender: F              HR:           73 bpm. Exam Location:  [facility] Procedure: Transesophageal Echo, Cardiac Doppler and Color Doppler Indications:     Bacteremia  History:         Patient has prior history of Echocardiogram examinations, most                  recent 07/23/2023. CAD, Stroke and COPD,                  Signs/Symptoms:Bacteremia, Edema and Syncope; Risk                  Factors:Hypertension, Sleep Apnea, Diabetes and Dyslipidemia.                  CKD.  Sonographer:     Christopher Furnace Sonographer#2:   Naomie Reef Referring Phys:  [id number] CARALYN HUDSON Diagnosing Phys: Dearl Leaven PROCEDURE: The transesophogeal probe was passed without difficulty through the esophogus of the patient. Sedation  performed by different physician. The patient was monitored while under deep sedation. Anesthestetic sedation was provided intravenously by Anesthesiology: 150mg  of Propofol . The patient developed no complications during the procedure.  IMPRESSIONS  1. No valvular vegetations. No significant valvular abnormalities.  2. Normal  biventricular systolic function. FINDINGS  Left Ventricle: Left ventricular ejection fraction, by estimation, is 60 to 65%. The left ventricle has normal function. The left ventricle has no regional wall motion abnormalities. The left ventricular internal cavity size was normal in size. Right Ventricle: The right ventricular size is normal. No increase in right ventricular wall thickness. Right ventricular systolic function is normal. Left Atrium: Left atrial size was normal in size. No left atrial/left atrial appendage thrombus was detected. Right Atrium: Right atrial size was normal in size. Pericardium: There is no evidence of pericardial effusion. Mitral Valve: The mitral valve is normal in structure. Trivial mitral valve regurgitation. No evidence of mitral valve stenosis. Tricuspid Valve: The tricuspid valve is normal in structure. Tricuspid valve regurgitation is trivial. Aortic Valve: The aortic valve is normal in structure. Aortic valve regurgitation is trivial. No aortic stenosis is present. Pulmonic Valve: The pulmonic valve was normal in structure. Pulmonic valve regurgitation is trivial. Aorta: The aortic root is normal in size and structure. Venous: The left upper pulmonary vein, left lower pulmonary vein, right upper pulmonary vein and right lower pulmonary vein are normal. A normal flow pattern is recorded from the left upper pulmonary vein. IAS/Shunts: No atrial level shunt detected by color flow Doppler. Akshay Pendyal Electronically signed by Dearl Leaven Signature Date/Time: 07/25/2023/1:44:42 PM    Final    MR SACRUM SI JOINTS WO CONTRAST Result Date: 07/25/2023 CLINICAL DATA:  Lower back pain.  Infection suspected. EXAM: MRI SACRUM WITHOUT CONTRAST TECHNIQUE: Multiplanar multi-sequence MR imaging of the sacrum was performed. No intravenous contrast was administered. COMPARISON:  PET-CT 06/17/2023, PET-CT 02/03/2023, CT abdomen and pelvis 11/09/2021, MRI lumbar spine 01/13/2021 PET-CT  01/15/2023 FINDINGS: Urinary Tract: Atrophy fluid is seen within the pelvis posterior to the urinary bladder. No focal urinary bladder wall thickening is seen within the visualized portion of the urinary bladder. Bowel:  Unremarkable visualized pelvic bowel loops. Vascular/Lymphatic: No pathologically enlarged lymph nodes. No significant vascular abnormality seen. Reproductive:  The uterus appears to be surgically absent. Musculoskeletal: Please see contemporaneous MRI lumbar spine report. Partial lumbarization of S1. Metallic susceptibility artifact from L3 through S1 bilateral transpedicular rod and screw fusion hardware. Grade 1 anterolisthesis of L5 on S1. Decreased T1 and increased T2 signal lesion measuring 11 x 8 x 10 mm (transverse by AP by craniocaudal) within the left aspect of the S3 vertebral body (sagittal series image 21; axial series 3 and series 4, image 25; coronal series 7, image 17)). This demonstrates mildly increased uptake on PET-CT 06/17/2023 that appears slightly increased from uptake on 02/03/2023 PET-CT. This appears new from MRI lumbar spine 01/13/2021. It is difficult to exclude a bone metastasis. There is no soft tissue ulcer or cortical erosion seen to suggest acute osteomyelitis. IMPRESSION: 1. No evidence of acute osteomyelitis. 2. Decreased T1 and increased T2 signal lesion measuring 11 x 8 x 10 mm within the left aspect of the S3 vertebral body. This demonstrates mildly increased uptake on PET-CT 06/17/2023 that appears slightly increased from uptake on 02/03/2023 PET-CT. This appears new from MRI lumbar spine 01/13/2021. It is difficult to exclude a bone metastasis. Electronically Signed   By: Tanda Lyons M.D.   On: 07/25/2023 12:39   CT CHEST  WO CONTRAST Result Date: 07/24/2023 CLINICAL DATA:  Sepsis EXAM: CT CHEST WITHOUT CONTRAST TECHNIQUE: Multidetector CT imaging of the chest was performed following the standard protocol without IV contrast. RADIATION DOSE REDUCTION: This  exam was performed according to the departmental dose-optimization program which includes automated exposure control, adjustment of the mA and/or kV according to patient size and/or use of iterative reconstruction technique. COMPARISON:  PET CT 06/17/2023, CT chest 01/16/2023 FINDINGS: Cardiovascular: Normal heart size. No significant pericardial effusion. The thoracic aorta is normal in caliber. Severe atherosclerotic plaque of the thoracic aorta. Four-vessel coronary artery calcifications. Mediastinum/Nodes: No gross hilar adenopathy, noting limited sensitivity for the detection of hilar adenopathy on this noncontrast study. Interval development of a borderline enlarged 1 cm precarinal lymph node (2:67). Borderline enlarged right paratracheal lymph node measuring 1.1 cm (2:61). No no enlarged axillary lymph nodes. Thyroid gland, trachea, and esophagus demonstrate no significant findings. Lungs/Pleura: Moderate centrilobular emphysematous changes. Diffuse bronchial wall thickening. Biapical pleural/pulmonary scarring. Stable left apical 1 x 0.9 cm pulmonary nodule (4:36). No pulmonary mass. Associated passive atelectasis of the bilateral lower lobes. Interval development of peribronchovascular ground-glass airspace opacities within the bilateral upper anterior lobes. Similar finding within the right middle lobe. Interval development of small right and trace left pleural effusions. No pneumothorax. Upper Abdomen: No acute abnormality. Musculoskeletal: No chest wall abnormality. No suspicious lytic or blastic osseous lesions. No acute displaced fracture. IMPRESSION: 1. Interval development of peribronchovascular and peripheral ground-glass airspace opacities within the bilateral upper anterior lobes. Similar finding within the right middle lobe. Finding could represent developing infection/inflammation. 2. Interval development of small right and trace left pleural effusions. 3. Interval development of borderline  enlarged mediastinal lymph nodes. No gross hilar adenopathy, noting limited sensitivity for the detection of hilar adenopathy on this noncontrast study. Recommend attention on follow-up. 4. Stable left apical 1 x 0.9 cm pulmonary nodule. Low-grade adenocarcinoma not excluded. Recommend follow-up CT in 4 months or tissue sampling as noted on PET CT 06/17/2023. 5. Aortic Atherosclerosis (ICD10-I70.0) including four-vessel coronary calcification. 6. Emphysema (ICD10-J43.9). Electronically Signed   By: Morgane  Naveau M.D.   On: 07/24/2023 19:50   MR LUMBAR SPINE WO CONTRAST Result Date: 07/24/2023 CLINICAL DATA:  Low back pain, infection suspected EXAM: MRI LUMBAR SPINE WITHOUT CONTRAST TECHNIQUE: Multiplanar, multisequence MR imaging of the lumbar spine was performed. No intravenous contrast was administered. COMPARISON:  04/29/2023 MRI lumbar spine FINDINGS: Segmentation: Transitional anatomy at S1, which is partially lumbarized. The last disc space is labeled S1-S2. Alignment: 4 mm anterolisthesis of L4 on L5 and 7 mm anterolisthesis of L5 on S1, which are both fused. Dextrocurvature. Vertebrae: Mildly increased T2 signal of L3, L4, and L5, which is improved compared to 04/29/2023. Otherwise normal marrow signal. No acute fracture, evidence of discitis, or suspicious osseous lesion. Conus medullaris and cauda equina: Conus extends to the L1-L2 level. Conus and cauda equina appear normal. Paraspinal and other soft tissues: Increased T2 signal is noted in paraspinous musculature bilaterally (series 7, images 8 and 13), which appears more advanced than on the prior study, possibly denervation edema. Redemonstrated trace fluid collection in the more superficial soft tissues (series 8, image 13 and series 5, image 10). Disc levels: T12-L1: No significant disc bulge. No spinal canal stenosis or neural foraminal narrowing. L1-L2: No significant disc bulge. No spinal canal stenosis or neural foraminal narrowing. L2-L3:  Mild disc bulge. Mild facet arthropathy. Narrowing of the left lateral recess. No spinal canal stenosis. Mild left neural foraminal narrowing. L3-L4:  Status post fusion and decompression. No significant spinal canal stenosis or neural foraminal narrowing. L4-L5: Fused anterolisthesis. Status post fusion and decompression. No spinal canal stenosis. Mild moderate left neural foraminal narrowing. L5-S1: Fused anterolisthesis. Status post fusion and decompression. No spinal canal stenosis or neural foraminal narrowing. IMPRESSION: 1. Status post fusion and decompression at L3-L4, L4-L5, and L5-S1. No spinal canal stenosis at these levels. Mild-to-moderate left neural foraminal narrowing at L4-L5. 2. L2-L3 mild left neural foraminal narrowing. Narrowing of the left lateral recess at this level could affect the descending left L3 nerve roots. 3. Increased T2 signal in paraspinous musculature bilaterally, which appears more advanced than on the prior study, possibly denervation edema or myositis. 4. No evidence of discitis or osteomyelitis. Electronically Signed   By: Donald Campion M.D.   On: 07/24/2023 17:47   ECHOCARDIOGRAM COMPLETE Result Date: 07/23/2023    ECHOCARDIOGRAM REPORT   Patient Name:   Mckenzie Lawrence Date of Exam: 07/23/2023 Medical Rec #:  [id number]      Height:       67.0 in Accession #:    [id number]     Weight:       163.8 lb Date of Birth:  01-01-1946      BSA:          1.858 m Patient Age:    77 years       BP:           172/70 mmHg Patient Gender: F              HR:           73 bpm. Exam Location:  [facility] Procedure: 2D Echo, Cardiac Doppler and Color Doppler Indications:     Bacteremia R78.81  History:         Patient has prior history of Echocardiogram examinations, most                  recent 03/16/2015. COPD; Risk Factors:Dyslipidemia and                  Hypertension.  Sonographer:     Christopher Furnace Referring Phys:  [id number] JENNIFER YATES Diagnosing Phys: Timothy Gollan MD IMPRESSIONS  1. Left  ventricular ejection fraction, by estimation, is 55 to 60%. Left ventricular ejection fraction by PLAX is 54 %. The left ventricle has normal function. The left ventricle has no regional wall motion abnormalities. Left ventricular diastolic parameters are indeterminate.  2. Right ventricular systolic function is normal. The right ventricular size is normal.  3. The mitral valve is normal in structure. Mild mitral valve regurgitation. No evidence of mitral stenosis.  4. The aortic valve is normal in structure. Aortic valve regurgitation is not visualized. No aortic stenosis is present.  5. The inferior vena cava is normal in size with greater than 50% respiratory variability, suggesting right atrial pressure of 3 mmHg. FINDINGS  Left Ventricle: Left ventricular ejection fraction, by estimation, is 55 to 60%. Left ventricular ejection fraction by PLAX is 54 %. The left ventricle has normal function. The left ventricle has no regional wall motion abnormalities. The left ventricular internal cavity size was normal in size. There is no left ventricular hypertrophy. Left ventricular diastolic parameters are indeterminate. Right Ventricle: The right ventricular size is normal. No increase in right ventricular wall thickness. Right ventricular systolic function is normal. Left Atrium: Left atrial size was normal in size. Right Atrium: Right atrial size was normal in size. Pericardium: There is no  evidence of pericardial effusion. Mitral Valve: The mitral valve is normal in structure. Mild mitral valve regurgitation. No evidence of mitral valve stenosis. MV peak gradient, 7.5 mmHg. The mean mitral valve gradient is 3.0 mmHg. There is no evidence of mitral valve vegetation. Tricuspid Valve: The tricuspid valve is normal in structure. Tricuspid valve regurgitation is mild . No evidence of tricuspid stenosis. Aortic Valve: The aortic valve is normal in structure. Aortic valve regurgitation is not visualized. No aortic stenosis  is present. Aortic valve mean gradient measures 4.0 mmHg. Aortic valve peak gradient measures 8.0 mmHg. Aortic valve area, by VTI measures 1.60 cm. Pulmonic Valve: The pulmonic valve was normal in structure. Pulmonic valve regurgitation is not visualized. No evidence of pulmonic stenosis. Aorta: The aortic root is normal in size and structure. Venous: The inferior vena cava is normal in size with greater than 50% respiratory variability, suggesting right atrial pressure of 3 mmHg. IAS/Shunts: No atrial level shunt detected by color flow Doppler.  LEFT VENTRICLE PLAX 2D LV EF:         Left            Diastology                ventricular     LV e' medial:    5.55 cm/s                ejection        LV E/e' medial:  23.4                fraction by     LV e' lateral:   13.50 cm/s                PLAX is 54      LV E/e' lateral: 9.6                %. LVIDd:         5.30 cm LVIDs:         3.80 cm LV PW:         0.90 cm LV IVS:        0.90 cm LVOT diam:     2.00 cm LV SV:         53 LV SV Index:   28 LVOT Area:     3.14 cm  RIGHT VENTRICLE RV Basal diam:  3.20 cm RV Mid diam:    2.50 cm RV S prime:     14.10 cm/s TAPSE (M-mode): 3.1 cm LEFT ATRIUM         Index       RIGHT ATRIUM           Index LA diam:    3.80 cm 2.05 cm/m  RA Area:     17.60 cm                                 RA Volume:   46.30 ml  24.92 ml/m  AORTIC VALVE AV Area (Vmax):    1.70 cm AV Area (Vmean):   1.73 cm AV Area (VTI):     1.60 cm AV Vmax:           141.50 cm/s AV Vmean:          97.300 cm/s AV VTI:            0.330 m AV Peak Grad:      8.0 mmHg  AV Mean Grad:      4.0 mmHg LVOT Vmax:         76.60 cm/s LVOT Vmean:        53.600 cm/s LVOT VTI:          0.168 m LVOT/AV VTI ratio: 0.51  AORTA Ao Root diam: 2.70 cm MITRAL VALVE                TRICUSPID VALVE MV Area (PHT): 4.96 cm     TR Peak grad:   24.0 mmHg MV Area VTI:   1.52 cm     TR Vmax:        245.00 cm/s MV Peak grad:  7.5 mmHg MV Mean grad:  3.0 mmHg     SHUNTS MV Vmax:       1.37  m/s     Systemic VTI:  0.17 m MV Vmean:      85.4 cm/s    Systemic Diam: 2.00 cm MV Decel Time: 153 msec MV E velocity: 130.00 cm/s MV A velocity: 92.80 cm/s MV E/A ratio:  1.40 Evalene Lunger MD Electronically signed by Evalene Lunger MD Signature Date/Time: 07/23/2023/11:26:53 AM    Final    DG Chest Port 1 View Result Date: 07/19/2023 CLINICAL DATA:  Fever and cough, questionable sepsis EXAM: PORTABLE CHEST 1 VIEW COMPARISON:  PET/CT 06/17/2023 hand radiograph 05/06/2018 FINDINGS: Stable cardiomediastinal silhouette. Aortic atherosclerotic calcification. Hyperinflation and chronic bronchitic change. Interstitial coarsening and patchy airspace opacities greatest in the right mid and lower lung. No pleural effusion pneumothorax. IMPRESSION: Patchy airspace opacities and interstitial coarsening greatest in the right mid and lower lung concerning for infection or aspiration. Emphysema. Electronically Signed   By: Norman Gatlin M.D.   On: 07/19/2023 21:10    Microbiology: Results for orders placed or performed during the hospital encounter of 07/19/23  Resp panel by RT-PCR (RSV, Flu A&B, Covid) Anterior Nasal Swab     Status: None   Collection Time: 07/19/23  8:40 PM   Specimen: Anterior Nasal Swab  Result Value Ref Range Status   SARS Coronavirus 2 by RT PCR NEGATIVE NEGATIVE Final    Comment: (NOTE) SARS-CoV-2 target nucleic acids are NOT DETECTED.  The SARS-CoV-2 RNA is generally detectable in upper respiratory specimens during the acute phase of infection. The lowest concentration of SARS-CoV-2 viral copies this assay can detect is 138 copies/mL. A negative result does not preclude SARS-Cov-2 infection and should not be used as the sole basis for treatment or other patient management decisions. A negative result may occur with  improper specimen collection/handling, submission of specimen other than nasopharyngeal swab, presence of viral mutation(s) within the areas targeted by this assay,  and inadequate number of viral copies(<138 copies/mL). A negative result must be combined with clinical observations, patient history, and epidemiological information. The expected result is Negative.  Fact Sheet for Patients:  bloggercourse.com  Fact Sheet for Healthcare Providers:  seriousbroker.it  This test is no t yet approved or cleared by the [place]  FDA and  has been authorized for detection and/or diagnosis of SARS-CoV-2 by FDA under an Emergency Use Authorization (EUA). This EUA will remain  in effect (meaning this test can be used) for the duration of the COVID-19 declaration under Section 564(b)(1) of the Act, 21 U.S.C.section 360bbb-3(b)(1), unless the authorization is terminated  or revoked sooner.       Influenza A by PCR NEGATIVE NEGATIVE Final   Influenza B by PCR NEGATIVE NEGATIVE Final    Comment: (  NOTE) The Xpert Xpress SARS-CoV-2/FLU/RSV plus assay is intended as an aid in the diagnosis of influenza from Nasopharyngeal swab specimens and should not be used as a sole basis for treatment. Nasal washings and aspirates are unacceptable for Xpert Xpress SARS-CoV-2/FLU/RSV testing.  Fact Sheet for Patients: bloggercourse.com  Fact Sheet for Healthcare Providers: seriousbroker.it  This test is not yet approved or cleared by the [place]  FDA and has been authorized for detection and/or diagnosis of SARS-CoV-2 by FDA under an Emergency Use Authorization (EUA). This EUA will remain in effect (meaning this test can be used) for the duration of the COVID-19 declaration under Section 564(b)(1) of the Act, 21 U.S.C. section 360bbb-3(b)(1), unless the authorization is terminated or revoked.     Resp Syncytial Virus by PCR NEGATIVE NEGATIVE Final    Comment: (NOTE) Fact Sheet for Patients: bloggercourse.com  Fact Sheet for  Healthcare Providers: seriousbroker.it  This test is not yet approved or cleared by the [place]  FDA and has been authorized for detection and/or diagnosis of SARS-CoV-2 by FDA under an Emergency Use Authorization (EUA). This EUA will remain in effect (meaning this test can be used) for the duration of the COVID-19 declaration under Section 564(b)(1) of the Act, 21 U.S.C. section 360bbb-3(b)(1), unless the authorization is terminated or revoked.  Performed at [facility], [address] [place], [place] [address]   Blood Culture (routine x 2)     Status: Abnormal   Collection Time: 07/19/23  8:40 PM   Specimen: BLOOD  Result Value Ref Range Status   Specimen Description   Final    BLOOD SITE NOT SPECIFIED Performed at [facility], [address], [place], [place] [address]    Special Requests   Final    BOTTLES DRAWN AEROBIC AND ANAEROBIC Blood Culture results may not be optimal due to an inadequate volume of blood received in culture bottles Performed at [facility], [address] [place], [place] [address]    Culture  Setup Time   Final    GRAM POSITIVE COCCI IN BOTH AEROBIC AND ANAEROBIC BOTTLES CRITICAL VALUE NOTED.  VALUE IS CONSISTENT WITH PREVIOUSLY REPORTED AND CALLED VALUE. GRAM STAIN REVIEWED-AGREE WITH RESULT Performed at [facility], [address] [place], [place] [address]    Culture (A)  Final    STAPHYLOCOCCUS AUREUS SUSCEPTIBILITIES PERFORMED ON PREVIOUS CULTURE WITHIN THE LAST 5 DAYS. Performed at [facility], [address] [place], [place] [address]    Report Status 07/25/2023 FINAL  Final  Blood Culture (routine x 2)     Status: Abnormal (Preliminary result)   Collection Time: 07/19/23  8:40 PM   Specimen: BLOOD  Result Value Ref Range Status   Specimen Description   Final    BLOOD SITE NOT SPECIFIED Performed at [facility], [address] [place], [place]  [address]    Special Requests   Final    BOTTLES DRAWN AEROBIC AND ANAEROBIC Blood Culture results may not be optimal due to an inadequate volume of blood received in culture bottles Performed at [facility], [address] [place], [place] [address]    Culture  Setup Time   Final    GRAM POSITIVE COCCI IN BOTH AEROBIC AND ANAEROBIC BOTTLES CRITICAL RESULT CALLED TO, READ BACK BY AND VERIFIED WITH:  NATHAN BELUE AT 0541 07/22/23 JG GRAM STAIN REVIEWED-AGREE WITH RESULT    Culture (A)  Final    METHICILLIN RESISTANT STAPHYLOCOCCUS AUREUS Sent  to [facility] for further susceptibility testing. Performed at [facility], [address] [place], [place] [address]    Report Status PENDING  Incomplete   Organism ID, Bacteria METHICILLIN RESISTANT STAPHYLOCOCCUS AUREUS  Final      Susceptibility   Methicillin resistant staphylococcus aureus - MIC*    CIPROFLOXACIN  >=8 RESISTANT Resistant     ERYTHROMYCIN  >=8 RESISTANT Resistant     GENTAMICIN  <=0.5 SENSITIVE Sensitive     OXACILLIN >=4 RESISTANT Resistant     TETRACYCLINE <=1 SENSITIVE Sensitive     VANCOMYCIN  1 SENSITIVE Sensitive     TRIMETH/SULFA <=10 SENSITIVE Sensitive     CLINDAMYCIN <=0.25 SENSITIVE Sensitive     RIFAMPIN <=0.5 SENSITIVE Sensitive     Inducible Clindamycin NEGATIVE Sensitive     LINEZOLID  2 SENSITIVE Sensitive     * METHICILLIN RESISTANT STAPHYLOCOCCUS AUREUS  Blood Culture ID Panel (Reflexed)     Status: Abnormal   Collection Time: 07/19/23  8:40 PM  Result Value Ref Range Status   Enterococcus faecalis NOT DETECTED NOT DETECTED Final   Enterococcus Faecium NOT DETECTED NOT DETECTED Final   Listeria monocytogenes NOT DETECTED NOT DETECTED Final   Staphylococcus species DETECTED (A) NOT DETECTED Final    Comment: CRITICAL RESULT CALLED TO, READ BACK BY AND VERIFIED WITH:  NATHAN BELUE AT 0541 07/22/23 JG    Staphylococcus aureus (BCID) DETECTED (A) NOT DETECTED Final    Comment: Methicillin  (oxacillin)-resistant Staphylococcus aureus (MRSA). MRSA is predictably resistant to beta-lactam antibiotics (except ceftaroline). Preferred therapy is vancomycin  unless clinically contraindicated. Patient requires contact precautions if  hospitalized. CRITICAL RESULT CALLED TO, READ BACK BY AND VERIFIED WITH:  NATHAN BELUE AT 0541 07/22/23 JG    Staphylococcus epidermidis NOT DETECTED NOT DETECTED Final   Staphylococcus lugdunensis NOT DETECTED NOT DETECTED Final   Streptococcus species NOT DETECTED NOT DETECTED Final   Streptococcus agalactiae NOT DETECTED NOT DETECTED Final   Streptococcus pneumoniae NOT DETECTED NOT DETECTED Final   Streptococcus pyogenes NOT DETECTED NOT DETECTED Final   A.calcoaceticus-baumannii NOT DETECTED NOT DETECTED Final   Bacteroides fragilis NOT DETECTED NOT DETECTED Final   Enterobacterales NOT DETECTED NOT DETECTED Final   Enterobacter cloacae complex NOT DETECTED NOT DETECTED Final   Escherichia coli NOT DETECTED NOT DETECTED Final   Klebsiella aerogenes NOT DETECTED NOT DETECTED Final   Klebsiella oxytoca NOT DETECTED NOT DETECTED Final   Klebsiella pneumoniae NOT DETECTED NOT DETECTED Final   Proteus species NOT DETECTED NOT DETECTED Final   Salmonella species NOT DETECTED NOT DETECTED Final   Serratia marcescens NOT DETECTED NOT DETECTED Final   Haemophilus influenzae NOT DETECTED NOT DETECTED Final   Neisseria meningitidis NOT DETECTED NOT DETECTED Final   Pseudomonas aeruginosa NOT DETECTED NOT DETECTED Final   Stenotrophomonas maltophilia NOT DETECTED NOT DETECTED Final   Candida albicans NOT DETECTED NOT DETECTED Final   Candida auris NOT DETECTED NOT DETECTED Final   Candida glabrata NOT DETECTED NOT DETECTED Final   Candida krusei NOT DETECTED NOT DETECTED Final   Candida parapsilosis NOT DETECTED NOT DETECTED Final   Candida tropicalis NOT DETECTED NOT DETECTED Final   Cryptococcus neoformans/gattii NOT DETECTED NOT DETECTED Final   Meth  resistant mecA/C and MREJ DETECTED (A) NOT DETECTED Final    Comment: CRITICAL RESULT CALLED TO, READ BACK BY AND VERIFIED WITH:  NATHAN BELUE AT 0541 07/22/23 JG Performed at [facility], [address] [place], [place] [address]   Culture, blood (Routine X 2) w Reflex to  ID Panel     Status: None (Preliminary result)   Collection Time: 07/22/23  9:40 AM   Specimen: BLOOD  Result Value Ref Range Status   Specimen Description BLOOD RIGHT ANTECUBITAL  Final   Special Requests   Final    BOTTLES DRAWN AEROBIC AND ANAEROBIC Blood Culture adequate volume   Culture   Final    NO GROWTH 4 DAYS Performed at [facility], [address], [place], [place] [address]    Report Status PENDING  Incomplete  Culture, blood (Routine X 2) w Reflex to ID Panel     Status: None (Preliminary result)   Collection Time: 07/22/23  9:40 AM   Specimen: BLOOD  Result Value Ref Range Status   Specimen Description BLOOD BLOOD LEFT HAND  Final   Special Requests   Final    BOTTLES DRAWN AEROBIC AND ANAEROBIC Blood Culture adequate volume   Culture   Final    NO GROWTH 4 DAYS Performed at [facility], [address] [place], [place] [address]    Report Status PENDING  Incomplete  MRSA Next Gen by PCR, Nasal     Status: None   Collection Time: 07/22/23  4:50 PM   Specimen: Nasal Mucosa; Nasal Swab  Result Value Ref Range Status   MRSA by PCR Next Gen NOT DETECTED NOT DETECTED Final    Comment: (NOTE) The GeneXpert MRSA Assay (FDA approved for NASAL specimens only), is one component of a comprehensive MRSA colonization surveillance program. It is not intended to diagnose MRSA infection nor to guide or monitor treatment for MRSA infections. Test performance is not FDA approved in patients less than 43 years old. Performed at [facility], [address] [place], [place] [address]   Respiratory (~20 pathogens) panel by PCR     Status: None   Collection Time:  07/22/23  4:50 PM   Specimen: Nasopharyngeal Swab; Respiratory  Result Value Ref Range Status   Adenovirus NOT DETECTED NOT DETECTED Final   Coronavirus 229E NOT DETECTED NOT DETECTED Final    Comment: (NOTE) The Coronavirus on the Respiratory Panel, DOES NOT test for the novel  Coronavirus (2019 nCoV)    Coronavirus HKU1 NOT DETECTED NOT DETECTED Final   Coronavirus NL63 NOT DETECTED NOT DETECTED Final   Coronavirus OC43 NOT DETECTED NOT DETECTED Final   Metapneumovirus NOT DETECTED NOT DETECTED Final   Rhinovirus / Enterovirus NOT DETECTED NOT DETECTED Final   Influenza A NOT DETECTED NOT DETECTED Final   Influenza B NOT DETECTED NOT DETECTED Final   Parainfluenza Virus 1 NOT DETECTED NOT DETECTED Final   Parainfluenza Virus 2 NOT DETECTED NOT DETECTED Final   Parainfluenza Virus 3 NOT DETECTED NOT DETECTED Final   Parainfluenza Virus 4 NOT DETECTED NOT DETECTED Final   Respiratory Syncytial Virus NOT DETECTED NOT DETECTED Final   Bordetella pertussis NOT DETECTED NOT DETECTED Final   Bordetella Parapertussis NOT DETECTED NOT DETECTED Final   Chlamydophila pneumoniae NOT DETECTED NOT DETECTED Final   Mycoplasma pneumoniae NOT DETECTED NOT DETECTED Final    Comment: Performed at [facility], [address] [place], [place] [address]  Culture, blood (Routine X 2) w Reflex to ID Panel     Status: None (Preliminary result)   Collection Time: 07/24/23  5:47 AM   Specimen: BLOOD  Result Value Ref Range Status   Specimen Description BLOOD BLOOD RIGHT ARM  Final   Special Requests   Final    BOTTLES DRAWN  AEROBIC AND ANAEROBIC Blood Culture adequate volume   Culture   Final    NO GROWTH 2 DAYS Performed at [facility], [address] [place], [place] [address]    Report Status PENDING  Incomplete  Culture, blood (Routine X 2) w Reflex to ID Panel     Status: None (Preliminary result)   Collection Time: 07/24/23  5:47 AM   Specimen: BLOOD  Result Value Ref Range  Status   Specimen Description BLOOD BLOOD LEFT ARM  Final   Special Requests   Final    BOTTLES DRAWN AEROBIC AND ANAEROBIC Blood Culture adequate volume   Culture   Final    NO GROWTH 2 DAYS Performed at [facility], [address] [place], [place] [address]    Report Status PENDING  Incomplete    Labs: CBC: Recent Labs  Lab 07/19/23 2040 07/20/23 1004 07/21/23 0409 07/22/23 0505 07/23/23 0614 07/25/23 0606 07/26/23 0944  WBC 12.9* 7.7 7.7 6.5 6.9 8.6 10.1  NEUTROABS 11.4* 6.1 5.9 4.9 4.6  --   --   HGB 9.0* 9.3* 8.4* 8.2* 8.0* 7.7* 8.0*  HCT 27.2* 28.6* 25.8* 23.8* 24.3* 24.1* 24.8*  MCV 96.1 97.9 97.7 94.1 95.7 98.0 96.9  PLT 207 195 193 187 228 306 362   Basic Metabolic Panel: Recent Labs  Lab 07/21/23 0409 07/22/23 0505 07/23/23 0614 07/24/23 0547 07/25/23 0606 07/26/23 0944  NA 135 133* 133*  --  136 136  K 4.1 3.9 4.0  --  3.8 4.0  CL 107 107 107  --  108 107  CO2 19* 16* 16*  --  21* 20*  GLUCOSE 171* 162* 123*  --  118* 168*  BUN 59* 56* 53*  --  48* 42*  CREATININE 3.15* 2.96* 2.83* 2.77* 2.96* 2.74*  CALCIUM  8.7* 8.7* 8.8*  --  8.7* 8.7*  PHOS  --   --   --   --   --  3.7   Liver Function Tests: Recent Labs  Lab 07/19/23 2040 07/26/23 0944  AST 24  --   ALT 14  --   ALKPHOS 88  --   BILITOT 0.6  --   PROT 6.6  --   ALBUMIN 2.8* 2.3*   CBG: Recent Labs  Lab 07/25/23 1126 07/25/23 1656 07/25/23 2123 07/26/23 0757 07/26/23 1122  GLUCAP 129* 145* 211* 157* 176*    Discharge time spent: greater than 30 minutes.  This record has been created using Conservation officer, historic buildings. Errors have been sought and corrected,but may not always be located. Such creation errors do not reflect on the standard of care.   Signed: Amaryllis Dare, MD [facility] 07/26/2023

## 2023-07-26 NOTE — Progress Notes (Signed)
 [facility]  ROUNDING NOTE   Subjective:   Mckenzie Lawrence is a 78 y.o. female with past medical history of CVA, SIADH, diabetes, hypertension, hyperlipidemia, COPD and chronic kidney disease stage 4. Patient presents to the hospital with fever and cough and has been admitted for CAP (community acquired pneumonia) [J18.9] AKI (acute kidney injury) (HCC) [N17.9] Sepsis due to pneumonia (HCC) [J18.9, A41.9] Pneumonia due to infectious organism, unspecified laterality, unspecified part of lung [J18.9] Sepsis, due to unspecified organism, unspecified whether acute organ dysfunction present [facility]) [A41.9]   Patient is known to our practice and is followed by Dr Marcelino. Last appt was on 06/05/23.  Update: Patient seen in bed, no complaints. Daughter at bedside. Possible d/c today or tomorrow per patient.   Objective:  Vital signs in last 24 hours:  Temp:  [97.9 F (36.6 C)-98.8 F (37.1 C)] 97.9 F (36.6 C) (02/08 0757) Pulse Rate:  [65-73] 66 (02/08 0757) Resp:  [15-20] 16 (02/08 0757) BP: (136-162)/(48-99) 154/48 (02/08 0757) SpO2:  [92 %-100 %] 98 % (02/08 0757)  Weight change:  Filed Weights   07/21/23 0520 07/25/23 1147  Weight: 74.3 kg 74.3 kg    Intake/Output: No intake/output data recorded.   Intake/Output this shift:  Total I/O In: 240 [P.O.:240] Out: -   Physical Exam: General: NAD,   Head: Normocephalic, atraumatic. Moist oral mucosal membranes  Eyes: Anicteric, PERRL  Neck: Supple, trachea midline  Lungs:  Clear to auscultation  Heart: Regular rate and rhythm  Abdomen:  Soft, nontender,   Extremities:  No peripheral edema.  Neurologic: Nonfocal, moving all four extremities  Skin: No lesions   Basic Metabolic Panel: Recent Labs  Lab 07/21/23 0409 07/22/23 0505 07/23/23 0614 07/24/23 0547 07/25/23 0606 07/26/23 0944  NA 135 133* 133*  --  136 136  K 4.1 3.9 4.0  --  3.8 4.0  CL 107 107 107  --  108 107  CO2 19* 16* 16*  --  21* 20*   GLUCOSE 171* 162* 123*  --  118* 168*  BUN 59* 56* 53*  --  48* 42*  CREATININE 3.15* 2.96* 2.83* 2.77* 2.96* 2.74*  CALCIUM  8.7* 8.7* 8.8*  --  8.7* 8.7*  PHOS  --   --   --   --   --  3.7    Liver Function Tests: Recent Labs  Lab 07/19/23 2040 07/26/23 0944  AST 24  --   ALT 14  --   ALKPHOS 88  --   BILITOT 0.6  --   PROT 6.6  --   ALBUMIN 2.8* 2.3*   No results for input(s): LIPASE, AMYLASE in the last 168 hours. No results for input(s): AMMONIA in the last 168 hours.  CBC: Recent Labs  Lab 07/19/23 2040 07/20/23 1004 07/21/23 0409 07/22/23 0505 07/23/23 0614 07/25/23 0606 07/26/23 0944  WBC 12.9* 7.7 7.7 6.5 6.9 8.6 10.1  NEUTROABS 11.4* 6.1 5.9 4.9 4.6  --   --   HGB 9.0* 9.3* 8.4* 8.2* 8.0* 7.7* 8.0*  HCT 27.2* 28.6* 25.8* 23.8* 24.3* 24.1* 24.8*  MCV 96.1 97.9 97.7 94.1 95.7 98.0 96.9  PLT 207 195 193 187 228 306 362    Cardiac Enzymes: No results for input(s): CKTOTAL, CKMB, CKMBINDEX, TROPONINI in the last 168 hours.  BNP: Invalid input(s): POCBNP  CBG: Recent Labs  Lab 07/25/23 1126 07/25/23 1656 07/25/23 2123 07/26/23 0757 07/26/23 1122  GLUCAP 129* 145* 211* 157* 176*    Microbiology: Results for  orders placed or performed during the hospital encounter of 07/19/23  Resp panel by RT-PCR (RSV, Flu A&B, Covid) Anterior Nasal Swab     Status: None   Collection Time: 07/19/23  8:40 PM   Specimen: Anterior Nasal Swab  Result Value Ref Range Status   SARS Coronavirus 2 by RT PCR NEGATIVE NEGATIVE Final    Comment: (NOTE) SARS-CoV-2 target nucleic acids are NOT DETECTED.  The SARS-CoV-2 RNA is generally detectable in upper respiratory specimens during the acute phase of infection. The lowest concentration of SARS-CoV-2 viral copies this assay can detect is 138 copies/mL. A negative result does not preclude SARS-Cov-2 infection and should not be used as the sole basis for treatment or other patient management decisions. A  negative result may occur with  improper specimen collection/handling, submission of specimen other than nasopharyngeal swab, presence of viral mutation(s) within the areas targeted by this assay, and inadequate number of viral copies(<138 copies/mL). A negative result must be combined with clinical observations, patient history, and epidemiological information. The expected result is Negative.  Fact Sheet for Patients:  bloggercourse.com  Fact Sheet for Healthcare Providers:  seriousbroker.it  This test is no t yet approved or cleared by the [place]  FDA and  has been authorized for detection and/or diagnosis of SARS-CoV-2 by FDA under an Emergency Use Authorization (EUA). This EUA will remain  in effect (meaning this test can be used) for the duration of the COVID-19 declaration under Section 564(b)(1) of the Act, 21 U.S.C.section 360bbb-3(b)(1), unless the authorization is terminated  or revoked sooner.       Influenza A by PCR NEGATIVE NEGATIVE Final   Influenza B by PCR NEGATIVE NEGATIVE Final    Comment: (NOTE) The Xpert Xpress SARS-CoV-2/FLU/RSV plus assay is intended as an aid in the diagnosis of influenza from Nasopharyngeal swab specimens and should not be used as a sole basis for treatment. Nasal washings and aspirates are unacceptable for Xpert Xpress SARS-CoV-2/FLU/RSV testing.  Fact Sheet for Patients: bloggercourse.com  Fact Sheet for Healthcare Providers: seriousbroker.it  This test is not yet approved or cleared by the [place]  FDA and has been authorized for detection and/or diagnosis of SARS-CoV-2 by FDA under an Emergency Use Authorization (EUA). This EUA will remain in effect (meaning this test can be used) for the duration of the COVID-19 declaration under Section 564(b)(1) of the Act, 21 U.S.C. section 360bbb-3(b)(1), unless the authorization  is terminated or revoked.     Resp Syncytial Virus by PCR NEGATIVE NEGATIVE Final    Comment: (NOTE) Fact Sheet for Patients: bloggercourse.com  Fact Sheet for Healthcare Providers: seriousbroker.it  This test is not yet approved or cleared by the [place]  FDA and has been authorized for detection and/or diagnosis of SARS-CoV-2 by FDA under an Emergency Use Authorization (EUA). This EUA will remain in effect (meaning this test can be used) for the duration of the COVID-19 declaration under Section 564(b)(1) of the Act, 21 U.S.C. section 360bbb-3(b)(1), unless the authorization is terminated or revoked.  Performed at [facility], [address] [place], [place] [address]   Blood Culture (routine x 2)     Status: Abnormal   Collection Time: 07/19/23  8:40 PM   Specimen: BLOOD  Result Value Ref Range Status   Specimen Description   Final    BLOOD SITE NOT SPECIFIED Performed at [facility], [address] [place], [place] [address]    Special Requests   Final    BOTTLES DRAWN  AEROBIC AND ANAEROBIC Blood Culture results may not be optimal due to an inadequate volume of blood received in culture bottles Performed at [facility], [address] [place], [place] [address]    Culture  Setup Time   Final    GRAM POSITIVE COCCI IN BOTH AEROBIC AND ANAEROBIC BOTTLES CRITICAL VALUE NOTED.  VALUE IS CONSISTENT WITH PREVIOUSLY REPORTED AND CALLED VALUE. GRAM STAIN REVIEWED-AGREE WITH RESULT Performed at [facility], [address] [place], [place] [address]    Culture (A)  Final    STAPHYLOCOCCUS AUREUS SUSCEPTIBILITIES PERFORMED ON PREVIOUS CULTURE WITHIN THE LAST 5 DAYS. Performed at [facility], [address] [place], [place] [address]    Report Status 07/25/2023 FINAL  Final  Blood Culture (routine x 2)     Status: Abnormal (Preliminary result)   Collection Time: 07/19/23   8:40 PM   Specimen: BLOOD  Result Value Ref Range Status   Specimen Description   Final    BLOOD SITE NOT SPECIFIED Performed at [facility], [address], [place], [place] [address]    Special Requests   Final    BOTTLES DRAWN AEROBIC AND ANAEROBIC Blood Culture results may not be optimal due to an inadequate volume of blood received in culture bottles Performed at [facility], [address] [place], [place] [address]    Culture  Setup Time   Final    GRAM POSITIVE COCCI IN BOTH AEROBIC AND ANAEROBIC BOTTLES CRITICAL RESULT CALLED TO, READ BACK BY AND VERIFIED WITH:  NATHAN BELUE AT 0541 07/22/23 JG GRAM STAIN REVIEWED-AGREE WITH RESULT    Culture (A)  Final    METHICILLIN RESISTANT STAPHYLOCOCCUS AUREUS Sent to [facility] for further susceptibility testing. Performed at [facility], [address], [place], [place] [address]    Report Status PENDING  Incomplete   Organism ID, Bacteria METHICILLIN RESISTANT STAPHYLOCOCCUS AUREUS  Final      Susceptibility   Methicillin resistant staphylococcus aureus - MIC*    CIPROFLOXACIN  >=8 RESISTANT Resistant     ERYTHROMYCIN  >=8 RESISTANT Resistant     GENTAMICIN  <=0.5 SENSITIVE Sensitive     OXACILLIN >=4 RESISTANT Resistant     TETRACYCLINE <=1 SENSITIVE Sensitive     VANCOMYCIN  1 SENSITIVE Sensitive     TRIMETH/SULFA <=10 SENSITIVE Sensitive     CLINDAMYCIN <=0.25 SENSITIVE Sensitive     RIFAMPIN <=0.5 SENSITIVE Sensitive     Inducible Clindamycin NEGATIVE Sensitive     LINEZOLID  2 SENSITIVE Sensitive     * METHICILLIN RESISTANT STAPHYLOCOCCUS AUREUS  Blood Culture ID Panel (Reflexed)     Status: Abnormal   Collection Time: 07/19/23  8:40 PM  Result Value Ref Range Status   Enterococcus faecalis NOT DETECTED NOT DETECTED Final   Enterococcus Faecium NOT DETECTED NOT DETECTED Final   Listeria monocytogenes NOT DETECTED NOT DETECTED Final   Staphylococcus species DETECTED (A) NOT DETECTED Final    Comment:  CRITICAL RESULT CALLED TO, READ BACK BY AND VERIFIED WITH:  NATHAN BELUE AT 0541 07/22/23 JG    Staphylococcus aureus (BCID) DETECTED (A) NOT DETECTED Final    Comment: Methicillin (oxacillin)-resistant Staphylococcus aureus (MRSA). MRSA is predictably resistant to beta-lactam antibiotics (except ceftaroline). Preferred therapy is vancomycin  unless clinically contraindicated. Patient requires contact precautions if  hospitalized. CRITICAL RESULT CALLED TO, READ BACK BY AND VERIFIED WITH:  NATHAN BELUE AT 9458 07/22/23 JG    Staphylococcus epidermidis NOT DETECTED NOT DETECTED Final   Staphylococcus lugdunensis  NOT DETECTED NOT DETECTED Final   Streptococcus species NOT DETECTED NOT DETECTED Final   Streptococcus agalactiae NOT DETECTED NOT DETECTED Final   Streptococcus pneumoniae NOT DETECTED NOT DETECTED Final   Streptococcus pyogenes NOT DETECTED NOT DETECTED Final   A.calcoaceticus-baumannii NOT DETECTED NOT DETECTED Final   Bacteroides fragilis NOT DETECTED NOT DETECTED Final   Enterobacterales NOT DETECTED NOT DETECTED Final   Enterobacter cloacae complex NOT DETECTED NOT DETECTED Final   Escherichia coli NOT DETECTED NOT DETECTED Final   Klebsiella aerogenes NOT DETECTED NOT DETECTED Final   Klebsiella oxytoca NOT DETECTED NOT DETECTED Final   Klebsiella pneumoniae NOT DETECTED NOT DETECTED Final   Proteus species NOT DETECTED NOT DETECTED Final   Salmonella species NOT DETECTED NOT DETECTED Final   Serratia marcescens NOT DETECTED NOT DETECTED Final   Haemophilus influenzae NOT DETECTED NOT DETECTED Final   Neisseria meningitidis NOT DETECTED NOT DETECTED Final   Pseudomonas aeruginosa NOT DETECTED NOT DETECTED Final   Stenotrophomonas maltophilia NOT DETECTED NOT DETECTED Final   Candida albicans NOT DETECTED NOT DETECTED Final   Candida auris NOT DETECTED NOT DETECTED Final   Candida glabrata NOT DETECTED NOT DETECTED Final   Candida krusei NOT DETECTED NOT DETECTED Final    Candida parapsilosis NOT DETECTED NOT DETECTED Final   Candida tropicalis NOT DETECTED NOT DETECTED Final   Cryptococcus neoformans/gattii NOT DETECTED NOT DETECTED Final   Meth resistant mecA/C and MREJ DETECTED (A) NOT DETECTED Final    Comment: CRITICAL RESULT CALLED TO, READ BACK BY AND VERIFIED WITHBETHA RANKIN DILLS AT 0541 07/22/23 JG Performed at [facility], [address] [place], [place] [address]   Culture, blood (Routine X 2) w Reflex to ID Panel     Status: None (Preliminary result)   Collection Time: 07/22/23  9:40 AM   Specimen: BLOOD  Result Value Ref Range Status   Specimen Description BLOOD RIGHT ANTECUBITAL  Final   Special Requests   Final    BOTTLES DRAWN AEROBIC AND ANAEROBIC Blood Culture adequate volume   Culture   Final    NO GROWTH 4 DAYS Performed at [facility], [address] [place], [place] [address]    Report Status PENDING  Incomplete  Culture, blood (Routine X 2) w Reflex to ID Panel     Status: None (Preliminary result)   Collection Time: 07/22/23  9:40 AM   Specimen: BLOOD  Result Value Ref Range Status   Specimen Description BLOOD BLOOD LEFT HAND  Final   Special Requests   Final    BOTTLES DRAWN AEROBIC AND ANAEROBIC Blood Culture adequate volume   Culture   Final    NO GROWTH 4 DAYS Performed at [facility], [address] [place], [place] [address]    Report Status PENDING  Incomplete  MRSA Next Gen by PCR, Nasal     Status: None   Collection Time: 07/22/23  4:50 PM   Specimen: Nasal Mucosa; Nasal Swab  Result Value Ref Range Status   MRSA by PCR Next Gen NOT DETECTED NOT DETECTED Final    Comment: (NOTE) The GeneXpert MRSA Assay (FDA approved for NASAL specimens only), is one component of a comprehensive MRSA colonization surveillance program. It is not intended to diagnose MRSA infection nor to guide or monitor treatment for MRSA infections. Test performance is not FDA approved in patients less than 46  years old. Performed at [facility], [address] [place], [place] [address]   Respiratory (~20  pathogens) panel by PCR     Status: None   Collection Time: 07/22/23  4:50 PM   Specimen: Nasopharyngeal Swab; Respiratory  Result Value Ref Range Status   Adenovirus NOT DETECTED NOT DETECTED Final   Coronavirus 229E NOT DETECTED NOT DETECTED Final    Comment: (NOTE) The Coronavirus on the Respiratory Panel, DOES NOT test for the novel  Coronavirus (2019 nCoV)    Coronavirus HKU1 NOT DETECTED NOT DETECTED Final   Coronavirus NL63 NOT DETECTED NOT DETECTED Final   Coronavirus OC43 NOT DETECTED NOT DETECTED Final   Metapneumovirus NOT DETECTED NOT DETECTED Final   Rhinovirus / Enterovirus NOT DETECTED NOT DETECTED Final   Influenza A NOT DETECTED NOT DETECTED Final   Influenza B NOT DETECTED NOT DETECTED Final   Parainfluenza Virus 1 NOT DETECTED NOT DETECTED Final   Parainfluenza Virus 2 NOT DETECTED NOT DETECTED Final   Parainfluenza Virus 3 NOT DETECTED NOT DETECTED Final   Parainfluenza Virus 4 NOT DETECTED NOT DETECTED Final   Respiratory Syncytial Virus NOT DETECTED NOT DETECTED Final   Bordetella pertussis NOT DETECTED NOT DETECTED Final   Bordetella Parapertussis NOT DETECTED NOT DETECTED Final   Chlamydophila pneumoniae NOT DETECTED NOT DETECTED Final   Mycoplasma pneumoniae NOT DETECTED NOT DETECTED Final    Comment: Performed at [facility], [address] [place], [place] [address]  Culture, blood (Routine X 2) w Reflex to ID Panel     Status: None (Preliminary result)   Collection Time: 07/24/23  5:47 AM   Specimen: BLOOD  Result Value Ref Range Status   Specimen Description BLOOD BLOOD RIGHT ARM  Final   Special Requests   Final    BOTTLES DRAWN AEROBIC AND ANAEROBIC Blood Culture adequate volume   Culture   Final    NO GROWTH 2 DAYS Performed at [facility], [address], [place], [place] [address]    Report Status PENDING   Incomplete  Culture, blood (Routine X 2) w Reflex to ID Panel     Status: None (Preliminary result)   Collection Time: 07/24/23  5:47 AM   Specimen: BLOOD  Result Value Ref Range Status   Specimen Description BLOOD BLOOD LEFT ARM  Final   Special Requests   Final    BOTTLES DRAWN AEROBIC AND ANAEROBIC Blood Culture adequate volume   Culture   Final    NO GROWTH 2 DAYS Performed at [facility], [address], [place], [place] [address]    Report Status PENDING  Incomplete    Coagulation Studies: No results for input(s): LABPROT, INR in the last 72 hours.  Urinalysis: No results for input(s): COLORURINE, LABSPEC, PHURINE, GLUCOSEU, HGBUR, BILIRUBINUR, KETONESUR, PROTEINUR, UROBILINOGEN, NITRITE, LEUKOCYTESUR in the last 72 hours.  Invalid input(s): APPERANCEUR    Imaging: ECHO TEE Result Date: 07/25/2023    TRANSESOPHOGEAL ECHO REPORT   Patient Name:   JOURNEE BOBROWSKI Date of Exam: 07/25/2023 Medical Rec #:  [id number]      Height:       67.0 in Accession #:    [id number]     Weight:       163.8 lb Date of Birth:  02/16/1946      BSA:          1.858 m Patient Age:    77 years       BP:           164/57 mmHg Patient Gender: F  HR:           73 bpm. Exam Location:  [facility] Procedure: Transesophageal Echo, Cardiac Doppler and Color Doppler Indications:     Bacteremia  History:         Patient has prior history of Echocardiogram examinations, most                  recent 07/23/2023. CAD, Stroke and COPD,                  Signs/Symptoms:Bacteremia, Edema and Syncope; Risk                  Factors:Hypertension, Sleep Apnea, Diabetes and Dyslipidemia.                  CKD.  Sonographer:     Christopher Furnace Sonographer#2:   Naomie Reef Referring Phys:  [id number] CARALYN HUDSON Diagnosing Phys: Dearl Leaven PROCEDURE: The transesophogeal probe was passed without difficulty through the esophogus of the patient. Sedation performed by different physician. The  patient was monitored while under deep sedation. Anesthestetic sedation was provided intravenously by Anesthesiology: 150mg  of Propofol . The patient developed no complications during the procedure.  IMPRESSIONS  1. No valvular vegetations. No significant valvular abnormalities.  2. Normal biventricular systolic function. FINDINGS  Left Ventricle: Left ventricular ejection fraction, by estimation, is 60 to 65%. The left ventricle has normal function. The left ventricle has no regional wall motion abnormalities. The left ventricular internal cavity size was normal in size. Right Ventricle: The right ventricular size is normal. No increase in right ventricular wall thickness. Right ventricular systolic function is normal. Left Atrium: Left atrial size was normal in size. No left atrial/left atrial appendage thrombus was detected. Right Atrium: Right atrial size was normal in size. Pericardium: There is no evidence of pericardial effusion. Mitral Valve: The mitral valve is normal in structure. Trivial mitral valve regurgitation. No evidence of mitral valve stenosis. Tricuspid Valve: The tricuspid valve is normal in structure. Tricuspid valve regurgitation is trivial. Aortic Valve: The aortic valve is normal in structure. Aortic valve regurgitation is trivial. No aortic stenosis is present. Pulmonic Valve: The pulmonic valve was normal in structure. Pulmonic valve regurgitation is trivial. Aorta: The aortic root is normal in size and structure. Venous: The left upper pulmonary vein, left lower pulmonary vein, right upper pulmonary vein and right lower pulmonary vein are normal. A normal flow pattern is recorded from the left upper pulmonary vein. IAS/Shunts: No atrial level shunt detected by color flow Doppler. Akshay Pendyal Electronically signed by Dearl Leaven Signature Date/Time: 07/25/2023/1:44:42 PM    Final    MR SACRUM SI JOINTS WO CONTRAST Result Date: 07/25/2023 CLINICAL DATA:  Lower back pain.  Infection  suspected. EXAM: MRI SACRUM WITHOUT CONTRAST TECHNIQUE: Multiplanar multi-sequence MR imaging of the sacrum was performed. No intravenous contrast was administered. COMPARISON:  PET-CT 06/17/2023, PET-CT 02/03/2023, CT abdomen and pelvis 11/09/2021, MRI lumbar spine 01/13/2021 PET-CT 01/15/2023 FINDINGS: Urinary Tract: Atrophy fluid is seen within the pelvis posterior to the urinary bladder. No focal urinary bladder wall thickening is seen within the visualized portion of the urinary bladder. Bowel:  Unremarkable visualized pelvic bowel loops. Vascular/Lymphatic: No pathologically enlarged lymph nodes. No significant vascular abnormality seen. Reproductive:  The uterus appears to be surgically absent. Musculoskeletal: Please see contemporaneous MRI lumbar spine report. Partial lumbarization of S1. Metallic susceptibility artifact from L3 through S1 bilateral transpedicular rod and screw fusion hardware. Grade 1 anterolisthesis of L5 on S1. Decreased  T1 and increased T2 signal lesion measuring 11 x 8 x 10 mm (transverse by AP by craniocaudal) within the left aspect of the S3 vertebral body (sagittal series image 21; axial series 3 and series 4, image 25; coronal series 7, image 17)). This demonstrates mildly increased uptake on PET-CT 06/17/2023 that appears slightly increased from uptake on 02/03/2023 PET-CT. This appears new from MRI lumbar spine 01/13/2021. It is difficult to exclude a bone metastasis. There is no soft tissue ulcer or cortical erosion seen to suggest acute osteomyelitis. IMPRESSION: 1. No evidence of acute osteomyelitis. 2. Decreased T1 and increased T2 signal lesion measuring 11 x 8 x 10 mm within the left aspect of the S3 vertebral body. This demonstrates mildly increased uptake on PET-CT 06/17/2023 that appears slightly increased from uptake on 02/03/2023 PET-CT. This appears new from MRI lumbar spine 01/13/2021. It is difficult to exclude a bone metastasis. Electronically Signed   By: Tanda Lyons M.D.   On: 07/25/2023 12:39   CT CHEST WO CONTRAST Result Date: 07/24/2023 CLINICAL DATA:  Sepsis EXAM: CT CHEST WITHOUT CONTRAST TECHNIQUE: Multidetector CT imaging of the chest was performed following the standard protocol without IV contrast. RADIATION DOSE REDUCTION: This exam was performed according to the departmental dose-optimization program which includes automated exposure control, adjustment of the mA and/or kV according to patient size and/or use of iterative reconstruction technique. COMPARISON:  PET CT 06/17/2023, CT chest 01/16/2023 FINDINGS: Cardiovascular: Normal heart size. No significant pericardial effusion. The thoracic aorta is normal in caliber. Severe atherosclerotic plaque of the thoracic aorta. Four-vessel coronary artery calcifications. Mediastinum/Nodes: No gross hilar adenopathy, noting limited sensitivity for the detection of hilar adenopathy on this noncontrast study. Interval development of a borderline enlarged 1 cm precarinal lymph node (2:67). Borderline enlarged right paratracheal lymph node measuring 1.1 cm (2:61). No no enlarged axillary lymph nodes. Thyroid gland, trachea, and esophagus demonstrate no significant findings. Lungs/Pleura: Moderate centrilobular emphysematous changes. Diffuse bronchial wall thickening. Biapical pleural/pulmonary scarring. Stable left apical 1 x 0.9 cm pulmonary nodule (4:36). No pulmonary mass. Associated passive atelectasis of the bilateral lower lobes. Interval development of peribronchovascular ground-glass airspace opacities within the bilateral upper anterior lobes. Similar finding within the right middle lobe. Interval development of small right and trace left pleural effusions. No pneumothorax. Upper Abdomen: No acute abnormality. Musculoskeletal: No chest wall abnormality. No suspicious lytic or blastic osseous lesions. No acute displaced fracture. IMPRESSION: 1. Interval development of peribronchovascular and peripheral  ground-glass airspace opacities within the bilateral upper anterior lobes. Similar finding within the right middle lobe. Finding could represent developing infection/inflammation. 2. Interval development of small right and trace left pleural effusions. 3. Interval development of borderline enlarged mediastinal lymph nodes. No gross hilar adenopathy, noting limited sensitivity for the detection of hilar adenopathy on this noncontrast study. Recommend attention on follow-up. 4. Stable left apical 1 x 0.9 cm pulmonary nodule. Low-grade adenocarcinoma not excluded. Recommend follow-up CT in 4 months or tissue sampling as noted on PET CT 06/17/2023. 5. Aortic Atherosclerosis (ICD10-I70.0) including four-vessel coronary calcification. 6. Emphysema (ICD10-J43.9). Electronically Signed   By: Morgane  Naveau M.D.   On: 07/24/2023 19:50   MR LUMBAR SPINE WO CONTRAST Result Date: 07/24/2023 CLINICAL DATA:  Low back pain, infection suspected EXAM: MRI LUMBAR SPINE WITHOUT CONTRAST TECHNIQUE: Multiplanar, multisequence MR imaging of the lumbar spine was performed. No intravenous contrast was administered. COMPARISON:  04/29/2023 MRI lumbar spine FINDINGS: Segmentation: Transitional anatomy at S1, which is partially lumbarized. The last disc space is  labeled S1-S2. Alignment: 4 mm anterolisthesis of L4 on L5 and 7 mm anterolisthesis of L5 on S1, which are both fused. Dextrocurvature. Vertebrae: Mildly increased T2 signal of L3, L4, and L5, which is improved compared to 04/29/2023. Otherwise normal marrow signal. No acute fracture, evidence of discitis, or suspicious osseous lesion. Conus medullaris and cauda equina: Conus extends to the L1-L2 level. Conus and cauda equina appear normal. Paraspinal and other soft tissues: Increased T2 signal is noted in paraspinous musculature bilaterally (series 7, images 8 and 13), which appears more advanced than on the prior study, possibly denervation edema. Redemonstrated trace fluid  collection in the more superficial soft tissues (series 8, image 13 and series 5, image 10). Disc levels: T12-L1: No significant disc bulge. No spinal canal stenosis or neural foraminal narrowing. L1-L2: No significant disc bulge. No spinal canal stenosis or neural foraminal narrowing. L2-L3: Mild disc bulge. Mild facet arthropathy. Narrowing of the left lateral recess. No spinal canal stenosis. Mild left neural foraminal narrowing. L3-L4: Status post fusion and decompression. No significant spinal canal stenosis or neural foraminal narrowing. L4-L5: Fused anterolisthesis. Status post fusion and decompression. No spinal canal stenosis. Mild moderate left neural foraminal narrowing. L5-S1: Fused anterolisthesis. Status post fusion and decompression. No spinal canal stenosis or neural foraminal narrowing. IMPRESSION: 1. Status post fusion and decompression at L3-L4, L4-L5, and L5-S1. No spinal canal stenosis at these levels. Mild-to-moderate left neural foraminal narrowing at L4-L5. 2. L2-L3 mild left neural foraminal narrowing. Narrowing of the left lateral recess at this level could affect the descending left L3 nerve roots. 3. Increased T2 signal in paraspinous musculature bilaterally, which appears more advanced than on the prior study, possibly denervation edema or myositis. 4. No evidence of discitis or osteomyelitis. Electronically Signed   By: Donald Campion M.D.   On: 07/24/2023 17:47     Medications:     amLODipine   10 mg Oral Daily   aspirin  EC  81 mg Oral QHS   epoetin  alfa-epbx (RETACRIT ) injection  4,000 Units Subcutaneous Weekly   Fe Fum-Vit C-Vit B12-FA  1 capsule Oral QPC breakfast   guaiFENesin   600 mg Oral BID   heparin  injection (subcutaneous)  5,000 Units Subcutaneous Q8H   insulin  aspart  0-15 Units Subcutaneous TID WC   insulin  aspart  0-5 Units Subcutaneous QHS   linezolid   600 mg Oral Q12H   losartan   100 mg Oral QHS   metoprolol  succinate  100 mg Oral QHS   nystatin   5 mL  Oral QID   rosuvastatin   5 mg Oral Daily   acetaminophen  **OR** acetaminophen , albuterol , cyclobenzaprine , docusate sodium , hydrALAZINE , hydrOXYzine , ondansetron  **OR** ondansetron  (ZOFRAN ) IV, sucralfate   Assessment/ Plan:  [name] is a 78 y.o.  female with past medical history of CVA, SIADH, diabetes, hypertension, hyperlipidemia, COPD and chronic kidney disease stage 4. Patient presents to the hospital with fever and cough and has been admitted for CAP (community acquired pneumonia) [J18.9] AKI (acute kidney injury) (HCC) [N17.9] Sepsis due to pneumonia (HCC) [J18.9, A41.9] Pneumonia due to infectious organism, unspecified laterality, unspecified part of lung [J18.9] Sepsis, due to unspecified organism, unspecified whether acute organ dysfunction present [facility]) [A41.9]    Acute Kidney Injury on chronic kidney disease stage IV with baseline creatinine 2.72 and GFR of 18 on 05/21/23.  Acute kidney injury secondary to infection CT abd pelvis negative for hydronephrosis. Creatinine on admission 3.20.              Creatinine has returned  to baseline. Continue to avoid nephrotoxic agents and therapies. Will continue to monitor renal indices.    Lab Results  Component Value Date   CREATININE 2.74 (H) 07/26/2023   CREATININE 2.96 (H) 07/25/2023   CREATININE 2.77 (H) 07/24/2023      Intake/Output Summary (Last 24 hours) at 07/25/2023 1121 Last data filed at 07/24/2023 1605    Gross per 24 hour  Intake 0 ml  Output --  Net 0 ml      2. MRSA pneumonia MRSA bacteremia, CAP vs aspiration pneumonia with sepsis. Chest xray concerning for infection vs aspiration. Blood cultures negative. ID following. Vancomycin  ordered.              TEE negative for abnormalities   3. Anemia of chronic kidney disease Recent Labs       Lab Results  Component Value Date    HGB 8 (L) 07/26/2023      Hgb improving, monitoring for changes   4. Secondary Hyperparathyroidism: with outpatient labs: PTH  122, phosphorus 5.4, calcium  9.6 on 07/14/23.    Recent Labs       Lab Results  Component Value Date    CALCIUM  8.7 (L) 07/26/2023    CAION 1.20 05/27/2023      Calcium  within optimal range. Appetite appropriate. Monitoring for changes     LOS: 6 Moksha Dorgan SHAUNNA Dines 2/8/202511:52 AM

## 2023-07-26 NOTE — TOC Transition Note (Signed)
 Transition of Care [facility]) - Discharge Note   Patient Details  Name: Mckenzie Lawrence MRN: [id number] Date of Birth: February 28, 1946  Transition of [facility]) CM/SW Contact:  Rolin JONETTA Kerns, LCSW Phone Number: 07/26/2023, 3:35 PM   Clinical Narrative:    CSW spoke with pt's daughter. She endorses frustration that after pt was d/c, they were unable to obtain Epoetin  alfa-epbx (Retacrit ) from pharmacy, [facility]. States she was informed that the only method of obtaining medication is being administered by specialist. Ms. Leora agreed to f/up with cancer center to schedule appt for injection that is due 07/31/23. States she is in need of an additional medication, will f/up with ID. CSW informed attending provider of family's concerns for reference.   CSW informed [facility] of pt's discharge. No additional concerns noted.    Final next level of care: Home w Home Health Services Barriers to Discharge: No Barriers Identified   Patient Goals and CMS Choice   CMS Medicare.gov Compare Post Acute Care list provided to:: Patient Choice offered to / list presented to : Patient      Discharge Placement                       Discharge Plan and Services Additional resources added to the After Visit Summary for       Post Acute Care Choice: Home Health          DME Arranged: N/A         HH Arranged: PT, OT, Nurse's Aide HH Agency: [facility] Date [facility] Agency Contacted: 07/26/23 Time HH Agency Contacted: 1530 Representative spoke with at [facility] Agency: [place]  Social Drivers of Health (SDOH) Interventions SDOH Screenings   Food Insecurity: No Food Insecurity (07/20/2023)  Housing: Unknown (07/20/2023)  Transportation Needs: No Transportation Needs (07/20/2023)  Utilities: Not At Risk (07/20/2023)  Depression (PHQ2-9): Low Risk  (04/03/2023)  Financial Resource Strain: Low Risk  (02/10/2023)   Received from [facility]  Social Connections: Unknown (07/21/2023)  Tobacco Use:  High Risk (07/19/2023)     Readmission Risk Interventions    07/22/2023   10:56 AM 08/13/2022    1:04 PM 11/10/2021    2:49 PM  Readmission Risk Prevention Plan  Transportation Screening Complete Complete Complete  PCP or Specialist Appt within 5-7 Days  Complete   PCP or Specialist Appt within 3-5 Days Complete  Complete  Home Care Screening  Complete   Medication Review (RN CM)  Complete   HRI or Home Care Consult Complete  Complete  Social Work Consult for Recovery Care Planning/Counseling Complete  Complete  Palliative Care Screening Not Applicable  Not Applicable  Medication Review Oceanographer) Complete  Complete

## 2023-07-27 LAB — CULTURE, BLOOD (ROUTINE X 2)
Culture: NO GROWTH
Culture: NO GROWTH
Special Requests: ADEQUATE
Special Requests: ADEQUATE

## 2023-07-28 ENCOUNTER — Telehealth: Payer: Self-pay

## 2023-07-28 ENCOUNTER — Encounter: Payer: Self-pay | Admitting: Cardiovascular Disease

## 2023-07-28 ENCOUNTER — Other Ambulatory Visit: Payer: Self-pay | Admitting: Infectious Diseases

## 2023-07-28 DIAGNOSIS — T847XXD Infection and inflammatory reaction due to other internal orthopedic prosthetic devices, implants and grafts, subsequent encounter: Secondary | ICD-10-CM

## 2023-07-28 MED ORDER — NYSTATIN 100000 UNIT/ML MT SUSP: 5.0000 mL | Freq: Four times a day (QID) | OROMUCOSAL | 1 refills | Status: DC

## 2023-07-28 NOTE — Telephone Encounter (Signed)
 Spoke with patient's daughter and notified her Lorree should have labs done on 2/13. Provided her with location of [facility]. Will call her back regarding Nystatin  once provider has had a chance to review. Lab orders placed.   Iva Posten D Calton Harshfield, RN

## 2023-07-28 NOTE — Telephone Encounter (Signed)
 Patient's daughter called asking about lab work that was discussed at discharge. Reports she was told Mckenzie Lawrence would need labs due to the antibiotics she is on.   Also asking if Dr. Francee Inch can send in nystatin  suspension that Mckenzie Lawrence was prescribed while inpatient. They use [facility] in [place].   Per daughter, okay to leave voicemail when calling back.   Mckenzie Brownley D Weiland Tomich, RN

## 2023-07-28 NOTE — Progress Notes (Signed)
 Nystatin sent to pharmacy

## 2023-07-28 NOTE — Addendum Note (Signed)
 Addended by: Arlon Bergamo D on: 07/28/2023 08:54 AM   Modules accepted: Orders

## 2023-07-29 DIAGNOSIS — E1169 Type 2 diabetes mellitus with other specified complication: Secondary | ICD-10-CM | POA: Diagnosis not present

## 2023-07-29 DIAGNOSIS — J439 Emphysema, unspecified: Secondary | ICD-10-CM | POA: Diagnosis not present

## 2023-07-29 DIAGNOSIS — I129 Hypertensive chronic kidney disease with stage 1 through stage 4 chronic kidney disease, or unspecified chronic kidney disease: Secondary | ICD-10-CM | POA: Diagnosis not present

## 2023-07-29 DIAGNOSIS — I69398 Other sequelae of cerebral infarction: Secondary | ICD-10-CM | POA: Diagnosis not present

## 2023-07-29 DIAGNOSIS — K219 Gastro-esophageal reflux disease without esophagitis: Secondary | ICD-10-CM | POA: Diagnosis not present

## 2023-07-29 DIAGNOSIS — M5126 Other intervertebral disc displacement, lumbar region: Secondary | ICD-10-CM | POA: Diagnosis not present

## 2023-07-29 DIAGNOSIS — N184 Chronic kidney disease, stage 4 (severe): Secondary | ICD-10-CM | POA: Diagnosis not present

## 2023-07-29 DIAGNOSIS — E222 Syndrome of inappropriate secretion of antidiuretic hormone: Secondary | ICD-10-CM | POA: Diagnosis not present

## 2023-07-29 DIAGNOSIS — I69351 Hemiplegia and hemiparesis following cerebral infarction affecting right dominant side: Secondary | ICD-10-CM | POA: Diagnosis not present

## 2023-07-29 DIAGNOSIS — I2510 Atherosclerotic heart disease of native coronary artery without angina pectoris: Secondary | ICD-10-CM | POA: Diagnosis not present

## 2023-07-29 DIAGNOSIS — J440 Chronic obstructive pulmonary disease with acute lower respiratory infection: Secondary | ICD-10-CM | POA: Diagnosis not present

## 2023-07-29 DIAGNOSIS — E1122 Type 2 diabetes mellitus with diabetic chronic kidney disease: Secondary | ICD-10-CM | POA: Diagnosis not present

## 2023-07-29 DIAGNOSIS — N179 Acute kidney failure, unspecified: Secondary | ICD-10-CM | POA: Diagnosis not present

## 2023-07-29 DIAGNOSIS — I951 Orthostatic hypotension: Secondary | ICD-10-CM | POA: Diagnosis not present

## 2023-07-29 DIAGNOSIS — A4102 Sepsis due to Methicillin resistant Staphylococcus aureus: Secondary | ICD-10-CM | POA: Diagnosis not present

## 2023-07-29 DIAGNOSIS — M48061 Spinal stenosis, lumbar region without neurogenic claudication: Secondary | ICD-10-CM | POA: Diagnosis not present

## 2023-07-29 DIAGNOSIS — J15212 Pneumonia due to Methicillin resistant Staphylococcus aureus: Secondary | ICD-10-CM | POA: Diagnosis not present

## 2023-07-29 DIAGNOSIS — D110 Benign neoplasm of parotid gland: Secondary | ICD-10-CM | POA: Diagnosis not present

## 2023-07-29 DIAGNOSIS — E7800 Pure hypercholesterolemia, unspecified: Secondary | ICD-10-CM | POA: Diagnosis not present

## 2023-07-29 DIAGNOSIS — G4733 Obstructive sleep apnea (adult) (pediatric): Secondary | ICD-10-CM | POA: Diagnosis not present

## 2023-07-29 DIAGNOSIS — D631 Anemia in chronic kidney disease: Secondary | ICD-10-CM | POA: Diagnosis not present

## 2023-07-29 DIAGNOSIS — I700 Atherosclerosis of aorta: Secondary | ICD-10-CM | POA: Diagnosis not present

## 2023-07-29 DIAGNOSIS — R1319 Other dysphagia: Secondary | ICD-10-CM | POA: Diagnosis not present

## 2023-07-29 DIAGNOSIS — M5136 Other intervertebral disc degeneration, lumbar region with discogenic back pain only: Secondary | ICD-10-CM | POA: Diagnosis not present

## 2023-07-29 LAB — MINIMUM INHIBITORY CONC. (1 DRUG): Source: 96388

## 2023-07-29 LAB — CULTURE, BLOOD (ROUTINE X 2)
Culture: NO GROWTH
Culture: NO GROWTH
Special Requests: ADEQUATE
Special Requests: ADEQUATE

## 2023-07-29 LAB — MIC RESULT

## 2023-07-29 NOTE — Telephone Encounter (Signed)
Left voicemail for patient's daughter stating nystatin suspension has been sent to [facility].   Sandie Ano, RN

## 2023-07-31 ENCOUNTER — Other Ambulatory Visit
Admission: RE | Admit: 2023-07-31 | Discharge: 2023-07-31 | Disposition: A | Discharge status: Home / Self Care | Payer: PPO | Attending: Infectious Diseases | Admitting: Infectious Diseases

## 2023-07-31 DIAGNOSIS — E1122 Type 2 diabetes mellitus with diabetic chronic kidney disease: Secondary | ICD-10-CM | POA: Diagnosis not present

## 2023-07-31 DIAGNOSIS — D631 Anemia in chronic kidney disease: Secondary | ICD-10-CM | POA: Diagnosis not present

## 2023-07-31 DIAGNOSIS — E871 Hypo-osmolality and hyponatremia: Secondary | ICD-10-CM | POA: Diagnosis not present

## 2023-07-31 DIAGNOSIS — T847XXD Infection and inflammatory reaction due to other internal orthopedic prosthetic devices, implants and grafts, subsequent encounter: Secondary | ICD-10-CM | POA: Diagnosis not present

## 2023-07-31 DIAGNOSIS — I10 Essential (primary) hypertension: Secondary | ICD-10-CM | POA: Diagnosis not present

## 2023-07-31 DIAGNOSIS — R809 Proteinuria, unspecified: Secondary | ICD-10-CM | POA: Diagnosis not present

## 2023-07-31 DIAGNOSIS — N1832 Chronic kidney disease, stage 3b: Secondary | ICD-10-CM | POA: Diagnosis not present

## 2023-07-31 DIAGNOSIS — N184 Chronic kidney disease, stage 4 (severe): Secondary | ICD-10-CM | POA: Diagnosis not present

## 2023-07-31 DIAGNOSIS — N2581 Secondary hyperparathyroidism of renal origin: Secondary | ICD-10-CM | POA: Diagnosis not present

## 2023-07-31 LAB — COMPREHENSIVE METABOLIC PANEL
ALT: 16 U/L (ref 0–44)
AST: 19 U/L (ref 15–41)
Albumin: 2.7 g/dL — ABNORMAL LOW (ref 3.5–5.0)
Alkaline Phosphatase: 83 U/L (ref 38–126)
Anion gap: 12 (ref 5–15)
BUN: 36 mg/dL — ABNORMAL HIGH (ref 8–23)
CO2: 20 mmol/L — ABNORMAL LOW (ref 22–32)
Calcium: 8.6 mg/dL — ABNORMAL LOW (ref 8.9–10.3)
Chloride: 105 mmol/L (ref 98–111)
Creatinine, Ser: 2.77 mg/dL — ABNORMAL HIGH (ref 0.44–1.00)
GFR, Estimated: 17 mL/min — ABNORMAL LOW (ref >60)
Glucose, Bld: 87 mg/dL (ref 70–99)
Potassium: 4.5 mmol/L (ref 3.5–5.1)
Sodium: 137 mmol/L (ref 135–145)
Total Bilirubin: 0.6 mg/dL (ref 0.0–1.2)
Total Protein: 6.4 g/dL — ABNORMAL LOW (ref 6.5–8.1)

## 2023-07-31 LAB — CBC WITH DIFFERENTIAL/PLATELET
Abs Immature Granulocytes: 0.06 10*3/uL (ref 0.00–0.07)
Basophils Absolute: 0 10*3/uL (ref 0.0–0.1)
Basophils Relative: 0 %
Eosinophils Absolute: 0.2 10*3/uL (ref 0.0–0.5)
Eosinophils Relative: 2 %
HCT: 28.9 % — ABNORMAL LOW (ref 36.0–46.0)
Hemoglobin: 9.3 g/dL — ABNORMAL LOW (ref 12.0–15.0)
Immature Granulocytes: 1 %
Lymphocytes Relative: 18 %
Lymphs Abs: 1.5 10*3/uL (ref 0.7–4.0)
MCH: 31.5 pg (ref 26.0–34.0)
MCHC: 32.2 g/dL (ref 30.0–36.0)
MCV: 98 fL (ref 80.0–100.0)
Monocytes Absolute: 0.5 10*3/uL (ref 0.1–1.0)
Monocytes Relative: 6 %
Neutro Abs: 6.3 10*3/uL (ref 1.7–7.7)
Neutrophils Relative %: 73 %
Platelets: 406 10*3/uL — ABNORMAL HIGH (ref 150–400)
RBC: 2.95 MIL/uL — ABNORMAL LOW (ref 3.87–5.11)
RDW: 14.6 % (ref 11.5–15.5)
WBC: 8.6 10*3/uL (ref 4.0–10.5)
nRBC: 0 % (ref 0.0–0.2)

## 2023-07-31 LAB — CULTURE, BLOOD (ROUTINE X 2)

## 2023-07-31 NOTE — Addendum Note (Signed)
Addended by: Lonell Face C on: 07/31/2023 10:30 AM   Modules accepted: Orders

## 2023-07-31 NOTE — Addendum Note (Signed)
Addended by: Lonell Face C on: 07/31/2023 10:31 AM   Modules accepted: Orders

## 2023-08-05 ENCOUNTER — Encounter: Payer: Self-pay | Admitting: Infectious Diseases

## 2023-08-05 ENCOUNTER — Ambulatory Visit: Payer: PPO | Attending: Infectious Diseases | Admitting: Infectious Diseases

## 2023-08-05 ENCOUNTER — Inpatient Hospital Stay: Payer: PPO | Admitting: Infectious Diseases

## 2023-08-05 VITALS — BP 183/92 | HR 79 | Temp 97.0°F | Ht 67.0 in | Wt 166.0 lb

## 2023-08-05 DIAGNOSIS — N184 Chronic kidney disease, stage 4 (severe): Secondary | ICD-10-CM | POA: Diagnosis not present

## 2023-08-05 DIAGNOSIS — B9562 Methicillin resistant Staphylococcus aureus infection as the cause of diseases classified elsewhere: Secondary | ICD-10-CM

## 2023-08-05 DIAGNOSIS — X58XXXA Exposure to other specified factors, initial encounter: Secondary | ICD-10-CM | POA: Diagnosis not present

## 2023-08-05 DIAGNOSIS — R197 Diarrhea, unspecified: Secondary | ICD-10-CM | POA: Diagnosis not present

## 2023-08-05 DIAGNOSIS — R7881 Bacteremia: Secondary | ICD-10-CM | POA: Diagnosis not present

## 2023-08-05 DIAGNOSIS — T8141XA Infection following a procedure, superficial incisional surgical site, initial encounter: Secondary | ICD-10-CM | POA: Diagnosis not present

## 2023-08-05 DIAGNOSIS — E1122 Type 2 diabetes mellitus with diabetic chronic kidney disease: Secondary | ICD-10-CM | POA: Diagnosis not present

## 2023-08-05 DIAGNOSIS — J440 Chronic obstructive pulmonary disease with acute lower respiratory infection: Secondary | ICD-10-CM | POA: Diagnosis not present

## 2023-08-05 DIAGNOSIS — D649 Anemia, unspecified: Secondary | ICD-10-CM | POA: Diagnosis not present

## 2023-08-05 DIAGNOSIS — I129 Hypertensive chronic kidney disease with stage 1 through stage 4 chronic kidney disease, or unspecified chronic kidney disease: Secondary | ICD-10-CM | POA: Insufficient documentation

## 2023-08-05 DIAGNOSIS — A4102 Sepsis due to Methicillin resistant Staphylococcus aureus: Secondary | ICD-10-CM | POA: Diagnosis not present

## 2023-08-05 DIAGNOSIS — N189 Chronic kidney disease, unspecified: Secondary | ICD-10-CM | POA: Diagnosis not present

## 2023-08-05 DIAGNOSIS — I2510 Atherosclerotic heart disease of native coronary artery without angina pectoris: Secondary | ICD-10-CM | POA: Diagnosis not present

## 2023-08-05 DIAGNOSIS — J15212 Pneumonia due to Methicillin resistant Staphylococcus aureus: Secondary | ICD-10-CM | POA: Diagnosis not present

## 2023-08-05 IMAGING — CT CT ABD-PELV W/O CM
2 of 4 series · 16 of 46 positions shown, 18 images · non-contrast
Comparison: CT abdomen/pelvis dated 03/13/2007

CLINICAL DATA: Pancreatitis, vomiting



[Series 2: routine abd/pel wo · axial · 0.84mm/px · z∈[-1149,-719]mm · 13 of 96 slices shown, 15 images]
[im 5/96  soft-tissue]
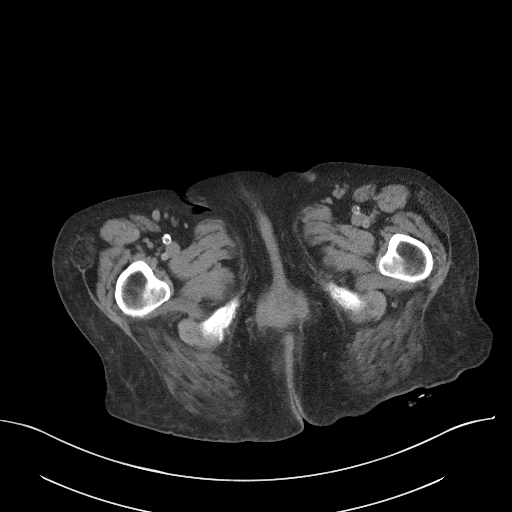
[im 5/96  bone]
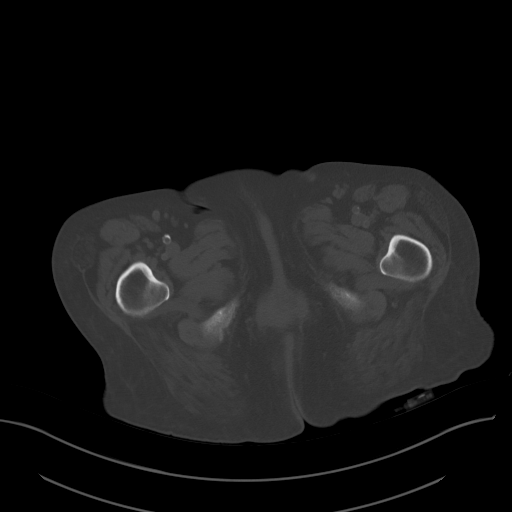
[im 13/96  soft-tissue]
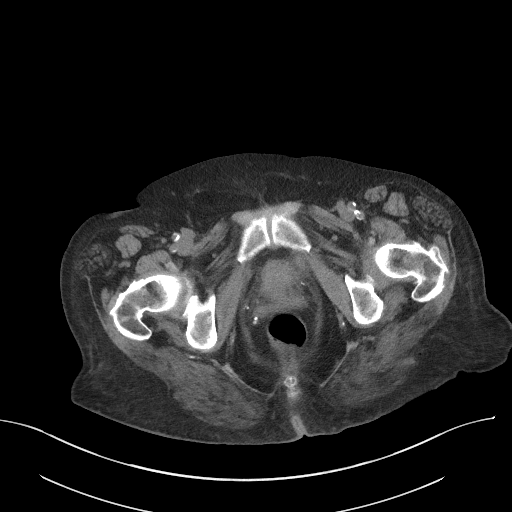
[im 22/96  soft-tissue]
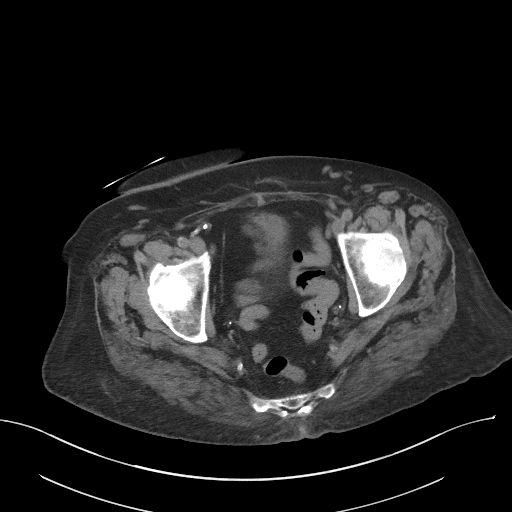
[im 26/96  soft-tissue]
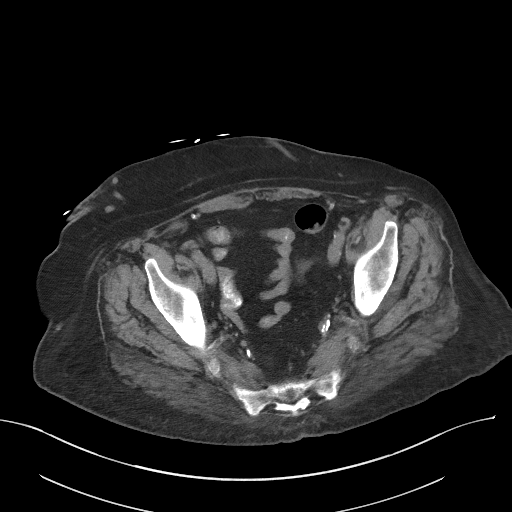
[im 35/96  soft-tissue]
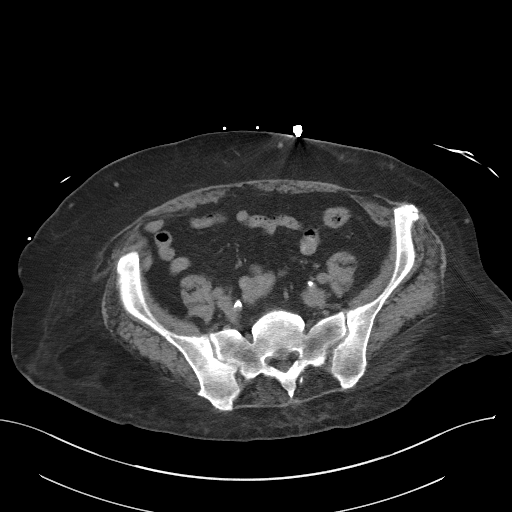
[im 39/96  soft-tissue]
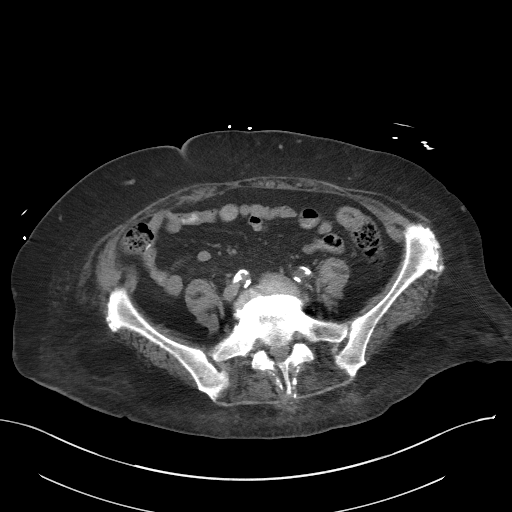
[im 48/96  soft-tissue]
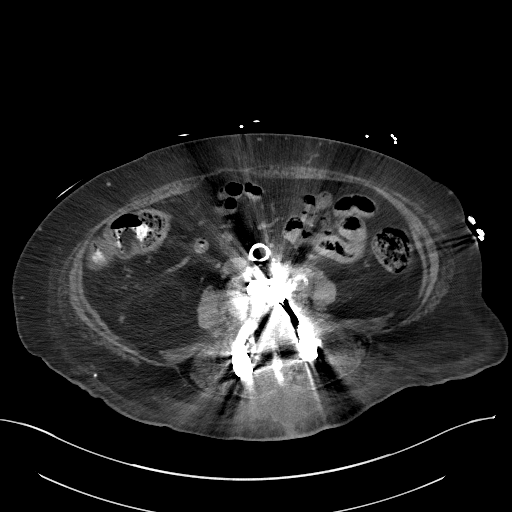
[im 57/96  soft-tissue]
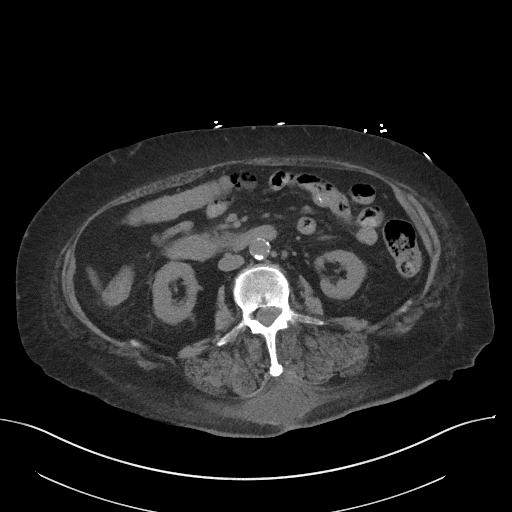
[im 61/96  soft-tissue]
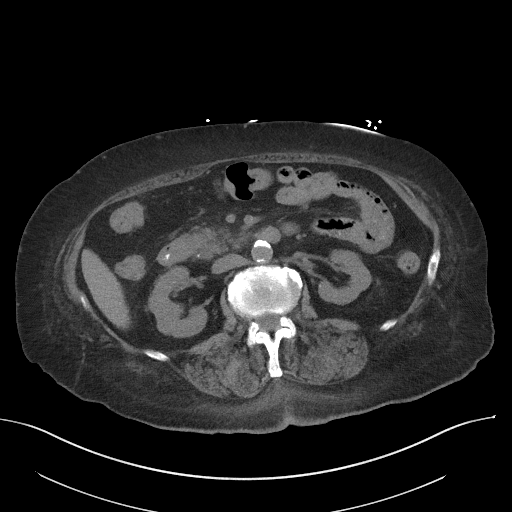
[im 61/96  bone]
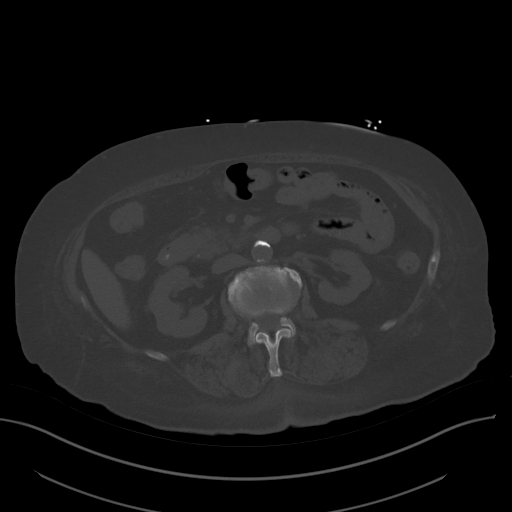
[im 70/96  soft-tissue]
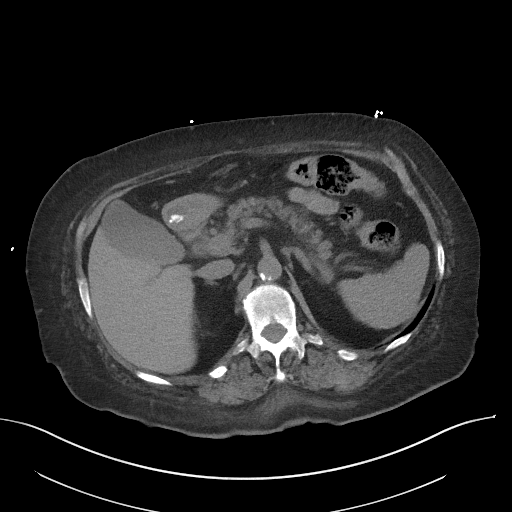
[im 74/96  soft-tissue]
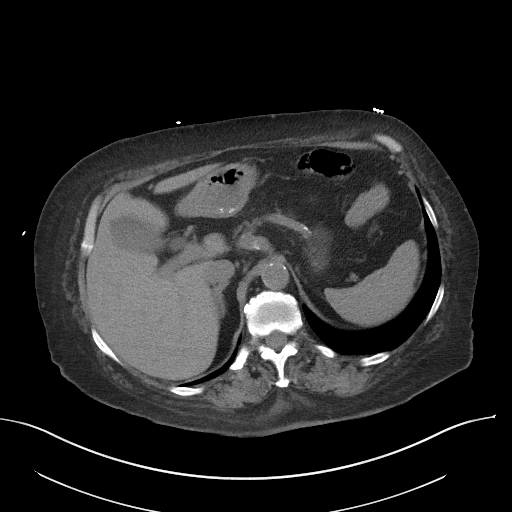
[im 83/96  soft-tissue]
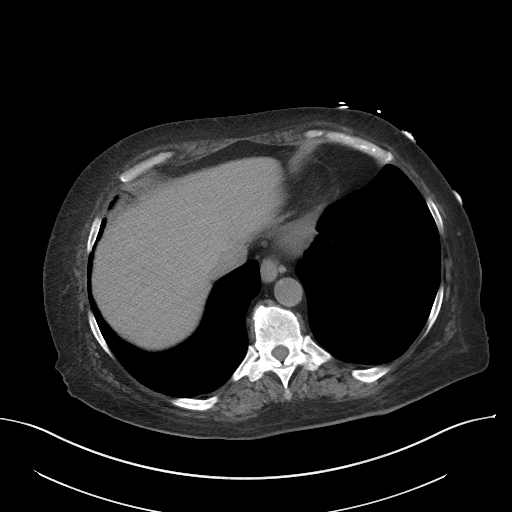
[im 91/96  soft-tissue]
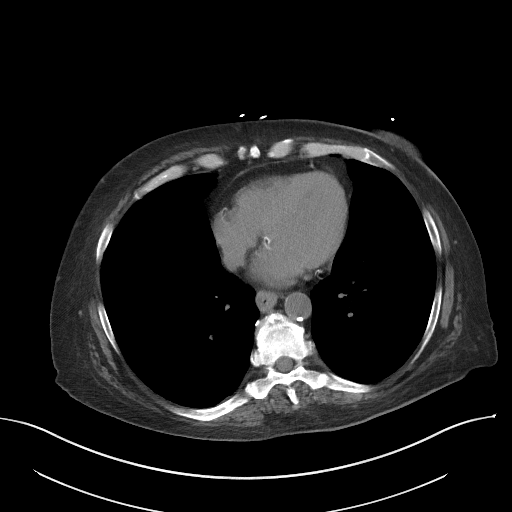

[Series 5: coronal st · coronal · 0.85mm/px · 3 of 93 slices shown]
[im 31/93  soft-tissue]
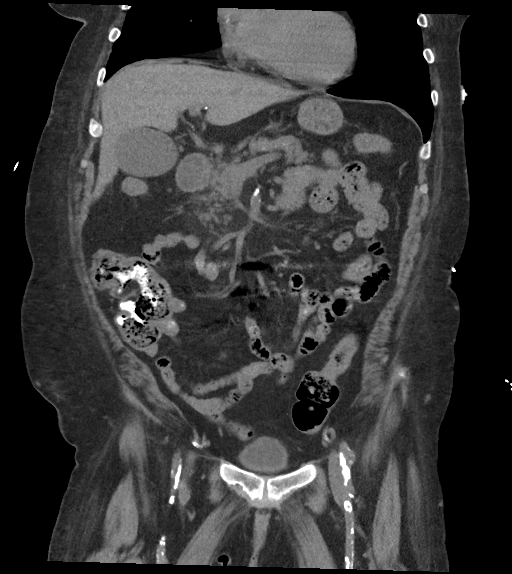
[im 41/93  soft-tissue]
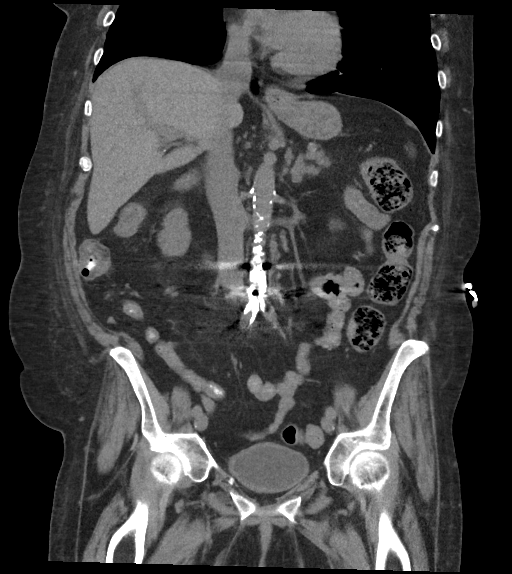
[im 52/93  soft-tissue]
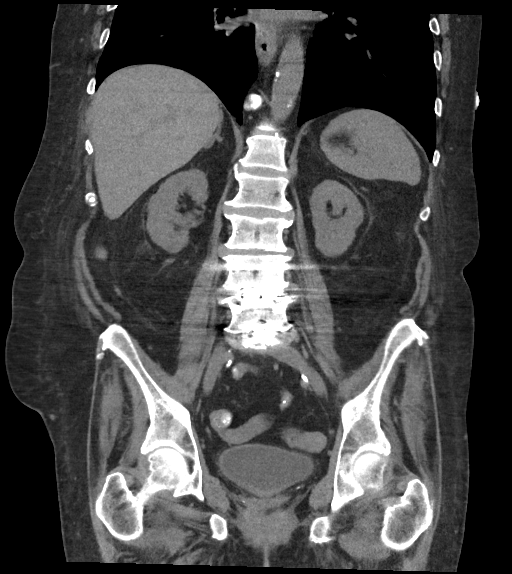

[16 of 46 positions shown; findings below may reference images not displayed]

FINDINGS: Lower chest: Lung bases are clear.

Hepatobiliary: Unenhanced liver is unremarkable.

Mild gallbladder sludge (series 2/image 26), without associated
inflammatory changes. No intrahepatic or extrahepatic duct
dilatation.

Pancreas: Very mild peripancreatic stranding along the pancreatic
tail (series 2/image 27), suggesting acute pancreatitis. No
drainable fluid collection/pseudocyst.

Spleen: Within normal limits.

Adrenals/Urinary Tract: Adrenal glands are within normal limits.

Kidneys are within normal limits. No renal, ureteral, or bladder
calculi. No hydronephrosis.

Mildly thick-walled bladder with perivesical stranding.

Stomach/Bowel: Stomach is within normal limits.

No evidence of bowel obstruction.

Normal appendix (series 2/image 4).

No colonic wall thickening or inflammatory changes.

Vascular/Lymphatic: No evidence of abdominal aortic aneurysm.

Atherosclerotic calcifications of the abdominal aorta and branch
vessels.

No suspicious abdominopelvic lymphadenopathy.

Reproductive: Prostate is unremarkable.

Other: No abdominopelvic ascites.

Musculoskeletal: Status post PLIF at L4-S1.
IMPRESSION: Very mild peripancreatic stranding along the pancreatic tail,
suggesting acute pancreatitis. No drainable fluid
collection/pseudocyst.

Mildly thick-walled bladder with perivesical stranding, correlate
for cystitis.

## 2023-08-05 NOTE — Progress Notes (Signed)
NAME: Mckenzie Lawrence  DOB: 1946/02/27  MRN: [id number]  Date/Time: 08/05/2023 12:07 PM  Subjective:  Follow up visit after recent hospitalization between 07/19/23-07/26/23 for resp illness and MRSA bacteremia ? DUANNA Lawrence is a 78 y.o. with a history of history of COPD, hypertension, hyperlipidemia, type 2 diabetes, CKD, lumbar L4-L5 and L5-S1 laminotomy/foraminotomies and medial facetectomy and posterior lumbar interbody fusion in April 2023, left L3-L4 redo intervertebral discectomy in December 2023, and then January 2024 underwent L3-L4 fusion surgery with instrumentation,Which was complicated by wound drainage and was taken back to the OR on 08/08/2022 for I&D.  Infection was did not track up to the deep deep tissue and did not involve the hardware.  Culture was positive for MRSA.  She was seen by infectious disease at that time and as daughter did not want her to have IV antibiotics at home she received weekly long-acting antibiotic which was dalbavancin into 3 doses and 1 dose of oritavancin. At a follow up visit in Amrch 2024 with Dr.Wallace the wound had healed completely   In August 2024 she had a PET scan for a new left upper lobe nodule which was concerning for neoplasm, no biopsy was done and the PET scan categorized it as lung RADS 4Bs suspicious.  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  She was seen by oncologist and referred to radiation oncology for  SBRT for stage I non-small cell lung cancer of the left upper lobe and completed in October 2024.  PET scan was repeated in December 2024 and that showed no evidence of recurrent or progressive disease.   She was recently in [facility] 07/19/23-07/26/23  with resp symptoms and fever after she had attended her sister's funeral  She was intially treated as CAP with ctx and azithromycin- RSV, covid, Flu neg, resp panel neg Her blood culture came back positive for MRSA 3/4 bottles . Repeat BC on  2/4 was negative even beofre vanco was started MRSA nares neg TEE neg MRI lumbar spine - no discitis or sote Has spinal hardware S3 there is a lesion and mets have to be considered She was  on vancomycin  Daughter and patient wanted PO meds and not PICC and home IV antibiotic She was sent home on PO linezolid for possible total of 4 weeks she has been taking since February 8th. She has about 12 days left of her four-week antibiotic course. She feels weak but notes gradual improvement in her strength, making an effort to shower and move around despite low energy levels.  She is experiencing significant diarrhea, approximately five to six times a day, which she manages with Imodium. No abdominal pain, nausea, or vomiting. There is a concern for Clostridium difficile infection, which needs to be ruled out.  She reports a sore and raw tongue, which she attributes to the antibiotic. She uses a baking soda treatment prescribed by Dr. Lourdes Sledge, which she believes is for her kidneys. She also mentions a lack of appetite and a metallic taste in her mouth, which she associates with the antibiotic. She experienced low blood sugar episodes twice the previous night.  She has a history of smoking but has reduced her intake to five or six cigarettes a day from three packs. She experiences shortness of breath with exertion but denies any cough or resting shortness of breath.   Past Medical History:  Diagnosis Date   Anemia    Arthritis  Bladder incontinence    Broken foot    Cataracts, bilateral    Chronic kidney insufficiency    stage 3b   COPD (chronic obstructive pulmonary disease) (HCC)    wheezing   Coronary artery disease 04/25/2022   in [place]   COVID 2021   very mild case   CVA (cerebral vascular accident) (HCC) 2016   has had 3 strokes, states right side is slightlyweaker than left   Diabetes mellitus    insulin dependent, Type 2   GERD (gastroesophageal reflux disease)    HLD  (hyperlipidemia)    Hx of cardiovascular stress test    a. ETT (6/13):  Ex 5:13; no ischemic changes   Hypertension    controlled on meds   Lacunar stroke of left subthalamic region [facility]) 02/2015   Leg pain    left   Lower back pain    Neuromuscular disorder (HCC)    stroke right hand tingling   Orthostatic hypotension    Osteopenia 01/2017   T score -2.0 stable from prior DEXA   Pancreatitis 10/2021   PCOS (polycystic ovarian syndrome)    Personal history of tobacco use, presenting hazards to health 01/09/2015   PONV (postoperative nausea and vomiting)    Sleep apnea    uses CPAP nightly    Past Surgical History:  Procedure Laterality Date   BIOPSY  08/28/2022   Procedure: BIOPSY;  Surgeon: Kathi Der, MD;  Location: [facility];  Service: Gastroenterology;;   BOTOX INJECTION  01/15/2022   Procedure: BOTOX INJECTION;  Surgeon: Vida Rigger, MD;  Location: [facility];  Service: Gastroenterology;;   BOTOX INJECTION N/A 08/28/2022   Procedure: BOTOX INJECTION;  Surgeon: Kathi Der, MD;  Location: [facility];  Service: Gastroenterology;  Laterality: N/A;   BOTOX INJECTION Bilateral 05/27/2023   Procedure: BOTOX INJECTION;  Surgeon: Vida Rigger, MD;  Location: [facility];  Service: Gastroenterology;  Laterality: Bilateral;   BROW LIFT Bilateral 11/04/2017   Procedure: BLEPHAROPLASTY UPPER EYELID W/EXCESS SKIN;  Surgeon: Imagene Riches, MD;  Location: [facility];  Service: Ophthalmology;  Laterality: Bilateral;  DIABETES-insulin dependent uses CPAP   CARDIAC CATHETERIZATION  20 yrs ago   found nothing   CARPAL TUNNEL RELEASE Bilateral    CATARACT EXTRACTION Bilateral    ELBOW SURGERY Bilateral    ESOPHAGOGASTRODUODENOSCOPY N/A 07/25/2021   Procedure: ESOPHAGOGASTRODUODENOSCOPY (EGD);  Surgeon: Toledo, Boykin Nearing, MD;  Location: [facility];  Service: Gastroenterology;  Laterality: N/A;  IDDM   ESOPHAGOGASTRODUODENOSCOPY N/A 01/15/2022   Procedure:  ESOPHAGOGASTRODUODENOSCOPY (EGD);  Surgeon: Vida Rigger, MD;  Location: [facility];  Service: Gastroenterology;  Laterality: N/A;  botox   ESOPHAGOGASTRODUODENOSCOPY (EGD) WITH PROPOFOL N/A 08/28/2022   Procedure: ESOPHAGOGASTRODUODENOSCOPY (EGD) WITH PROPOFOL;  Surgeon: Kathi Der, MD;  Location: [facility];  Service: Gastroenterology;  Laterality: N/A;   ESOPHAGOGASTRODUODENOSCOPY (EGD) WITH PROPOFOL Bilateral 05/27/2023   Procedure: ESOPHAGOGASTRODUODENOSCOPY (EGD) WITH PROPOFOL;  Surgeon: Vida Rigger, MD;  Location: [facility];  Service: Gastroenterology;  Laterality: Bilateral;   FOOT SURGERY     Groin Abscess     HAND SURGERY     KNEE SURGERY Bilateral    LABIAL ABSCESS     LUMBAR LAMINECTOMY/DECOMPRESSION MICRODISCECTOMY Left 05/11/2018   Procedure: LUMBAR LAMINECTOMY/DECOMPRESSION MICRODISCECTOMY 1 LEVEL- L4-5;  Surgeon: Lucy Chris, MD;  Location: [facility];  Service: Neurosurgery;  Laterality: Left;   LUMBAR LAMINECTOMY/DECOMPRESSION MICRODISCECTOMY Left 05/20/2022   Procedure: MICRODISCECTOMY L3-4;  Surgeon: Tressie Stalker, MD;  Location: [facility] OR;  Service: Neurosurgery;  Laterality: Left;  3C   LUMBAR WOUND DEBRIDEMENT N/A 08/08/2022   Procedure: INCISION AND DRAINAGE OF LUMBAR WOUND;  Surgeon: Tressie Stalker, MD;  Location: [facility] OR;  Service: Neurosurgery;  Laterality: N/A;  3C   OOPHORECTOMY     BSO   PUBO VAG SLING     SHOULDER SURGERY     bilateral arthroscopies   TEE WITHOUT CARDIOVERSION N/A 07/25/2023   Procedure: TRANSESOPHAGEAL ECHOCARDIOGRAM (TEE);  Surgeon: Tiajuana Amass, MD;  Location: [facility];  Service: Cardiovascular;  Laterality: N/A;   VAGINAL HYSTERECTOMY  1979    Social History   Socioeconomic History   Marital status: Married    Spouse name: Not on file   Number of children: 1   Years of education: Not on file   Highest education level: Not on file  Occupational History   Not on file  Tobacco Use   Smoking status: Every Day    Current  packs/day: 1.50    Average packs/day: 1.5 packs/day for 50.0 years (75.0 ttl pk-yrs)    Types: Cigarettes   Smokeless tobacco: Never   Tobacco comments:    3ppd x 10 year, then cut back to 1.5pdd since 02/2015  Vaping Use   Vaping status: Never Used  Substance and Sexual Activity   Alcohol use: Never    Alcohol/week: 0.0 standard drinks of alcohol   Drug use: No   Sexual activity: Not Currently    Birth control/protection: Surgical    Comment: Hx Hysterectomy, 1st intercourse 78 yo-Fewer than 5 partners  Other Topics Concern   Not on file  Social History Narrative   Lives at home with husband in a one story home.  Has 1 daughter.     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     Education: some college.   Social Drivers of Corporate investment banker Strain: Low Risk  (02/10/2023)   Received from [facility]   Overall Financial Resource Strain (CARDIA)    Difficulty of Paying Living Expenses: Not hard at all  Food Insecurity: No Food Insecurity (07/20/2023)   Hunger Vital Sign    Worried About Running Out of Food in the Last Year: Never true    Ran Out of Food in the Last Year: Never true  Transportation Needs: No Transportation Needs (07/20/2023)   PRAPARE - Administrator, Civil Service (Medical): No    Lack of Transportation (Non-Medical): No  Physical Activity: Not on file  Stress: Not on file  Social Connections: Unknown (07/21/2023)   Social Connection and Isolation Panel [NHANES]    Frequency of Communication with Friends and Family: More than three times a week    Frequency of Social Gatherings with Friends and Family: More than three times a week    Attends Religious Services: Patient declined    Database administrator or Organizations: Patient declined    Attends Banker Meetings: Patient declined    Marital Status: Married  Catering manager Violence: Not At Risk (07/20/2023)   Humiliation, Afraid, Rape, and Kick  questionnaire    Fear of Current or Ex-Partner: No    Emotionally Abused: No    Physically Abused: No    Sexually Abused: No    Family History  Problem Relation Age of Onset   Hypertension Mother    Heart disease Mother 76       MI   Diabetes Father    Diabetes Sister    Hypertension Sister  Diabetes Brother    Hypertension Brother    Heart disease Brother 83       CAD   Cancer Sister        Multiple myloma   Diabetes Brother    Allergies  Allergen Reactions   Iodine Anaphylaxis and Other (See Comments)    Pt states that she is allergic to ingested iodine only, okay for betadine.     Iodine I 131 Tositumomab Anaphylaxis   Shellfish Allergy Anaphylaxis   Codeine Nausea And Vomiting   Morphine Sulfate Nausea And Vomiting   Irbesartan Other (See Comments)     Unknown  (Avapro)   Sulfa Antibiotics Other (See Comments)    Fever    I? Current Outpatient Medications  Medication Sig Dispense Refill   acetaminophen (TYLENOL) 500 MG tablet Take 1,000 mg by mouth every 6 (six) hours as needed for moderate pain.     albuterol (VENTOLIN HFA) 108 (90 Base) MCG/ACT inhaler Inhale 2 puffs into the lungs every 6 (six) hours as needed for wheezing or shortness of breath.     amLODipine (NORVASC) 10 MG tablet Take 1 tablet (10 mg total) by mouth daily. 30 tablet 2   aspirin EC 81 MG tablet Take 81 mg by mouth at bedtime. Swallow whole.     BD INSULIN SYRINGE U/F 31G X 5/16" 1 ML MISC USE 1 SYRINGE AS DIRECTED     calcitRIOL (ROCALTROL) 0.25 MCG capsule Take 0.25 mcg by mouth at bedtime.     cholecalciferol (VITAMIN D3) 25 MCG (1000 UNIT) tablet Take 1,000 Units by mouth daily.     Continuous Glucose Sensor (FREESTYLE LIBRE 2 SENSOR) MISC      cyclobenzaprine (FLEXERIL) 10 MG tablet Take 1 tablet (10 mg total) by mouth 3 (three) times daily as needed for muscle spasms. 30 tablet 0   D-MANNOSE PO Take 2 tablets by mouth at bedtime.     diazepam (VALIUM) 5 MG tablet Take 5 mg by mouth 2  (two) times daily as needed.     diphenhydrAMINE (BENADRYL) 25 MG tablet Take 50 mg by mouth at bedtime as needed for sleep.     docusate sodium (COLACE) 100 MG capsule Take 1 capsule (100 mg total) by mouth 2 (two) times daily. (Patient taking differently: Take 100 mg by mouth at bedtime as needed for mild constipation or moderate constipation.) 30 capsule 0   epoetin alfa-epbx (RETACRIT) 4000 UNIT/ML injection Inject 1 mL (4,000 Units total) into the skin once a week. 4 mL 3   estradiol (ESTRACE) 0.1 MG/GM vaginal cream Place 1 Applicatorful vaginally 3 (three) times a week.     Fe Fum-Vit C-Vit B12-FA (TRIGELS-F FORTE) CAPS capsule Take 1 capsule by mouth daily after breakfast. 90 capsule 0   feeding supplement (BOOST HIGH PROTEIN) LIQD Take 1 Container by mouth 2 (two) times daily between meals.     insulin detemir (LEVEMIR) 100 UNIT/ML injection Inject 100-120 Units into the skin daily.     linezolid (ZYVOX) 600 MG tablet Take 1 tablet (600 mg total) by mouth 2 (two) times daily for 21 days. 42 tablet 0   losartan (COZAAR) 100 MG tablet Take 1 tablet (100 mg total) by mouth daily. (Patient taking differently: Take 100 mg by mouth at bedtime.) 30 tablet 2   metoprolol succinate (TOPROL-XL) 100 MG 24 hr tablet Take 100 mg by mouth at bedtime.     Multiple Vitamins-Minerals (PRESERVISION AREDS 2+MULTI VIT PO) Take 1 capsule by mouth daily.  nystatin (MYCOSTATIN) 100000 UNIT/ML suspension Take 5 mLs (500,000 Units total) by mouth 4 (four) times daily. 120 mL 1   ONETOUCH ULTRA test strip 4 (four) times daily.     rosuvastatin (CRESTOR) 5 MG tablet Take 5 mg by mouth daily.     sennosides-docusate sodium (SENOKOT-S) 8.6-50 MG tablet Take 1 tablet by mouth daily as needed for constipation.     sodium bicarbonate 650 MG tablet Take 1 tablet (650 mg total) by mouth 2 (two) times daily. 100 tablet 1   sucralfate (CARAFATE) 1 g tablet Take 1 tablet (1 g total) by mouth 2 (two) times daily as needed  (acid reflux).     Vibegron (GEMTESA) 75 MG TABS Take 75 mg by mouth at bedtime.     zolpidem (AMBIEN) 10 MG tablet Take 10 mg by mouth at bedtime as needed for sleep.     No current facility-administered medications for this visit.     Abtx:  Anti-infectives (From admission, onward)    None       REVIEW OF SYSTEMS:  Const: negative fever, negative chills, negative weight loss Eyes: negative diplopia or visual changes, negative eye pain ENT: negative coryza, negative sore throat Resp: negative cough, hemoptysis, has dyspnea on exertion Cards: negative for chest pain, palpitations, lower extremity edema GU: negative for frequency, dysuria and hematuria GI: Negative for abdominal pain, has  diarrhea, bleeding, constipation Skin: negative for rash and pruritus Heme: negative for easy bruising and gum/nose bleeding MS: negative for myalgias, arthralgias, back pain has some weakness Neurolo:negative for headaches, dizziness, vertigo, memory problems  Psych: negative for feelings of anxiety, depression  Endocrine:, diabetes Allergy/Immunology- as above Objective:  VITALS:  BP (!) 183/92   Pulse 79   Temp (!) 97 F (36.1 C) (Temporal)   Ht 5\' 7"  (1.702 m)   Wt 166 lb (75.3 kg)   SpO2 99%   BMI 26.00 kg/m   PHYSICAL EXAM:  General: Alert, cooperative, no distress, appears stated age.  Head: Normocephalic, without obvious abnormality, atraumatic. Eyes: Conjunctivae clear, anicteric sclerae. Pupils are equal ENT Nares normal. No drainage or sinus tenderness. Lips, mucosa, and tongue normal. No Thrush Neck: Supple, symmetrical, no adenopathy, thyroid: non tender no carotid bruit and no JVD. Back: No CVA tenderness. Lungs: b/l air entry Heart: Regular rate and rhythm, no murmur, rub or gallop. Abdomen: Soft, non-tender,not distended. Bowel sounds normal. No masses Extremities: atraumatic, no cyanosis. No edema. No clubbing Skin: No rashes or lesions. Or bruising Lymph:  Cervical, supraclavicular normal. Neurologic: Grossly non-focal Pertinent Labs Lab Results CBC    Component Value Date/Time   WBC 8.6 07/31/2023 1043   RBC 2.95 (L) 07/31/2023 1043   HGB 9.3 (L) 07/31/2023 1043   HGB 13.6 12/01/2011 1656   HCT 28.9 (L) 07/31/2023 1043   HCT 40.9 12/01/2011 1656   PLT 406 (H) 07/31/2023 1043   PLT 299 12/01/2011 1656   MCV 98.0 07/31/2023 1043   MCV 95 12/01/2011 1656   MCH 31.5 07/31/2023 1043   MCHC 32.2 07/31/2023 1043   RDW 14.6 07/31/2023 1043   RDW 13.1 12/01/2011 1656   LYMPHSABS 1.5 07/31/2023 1043   MONOABS 0.5 07/31/2023 1043   EOSABS 0.2 07/31/2023 1043   BASOSABS 0.0 07/31/2023 1043       Latest Ref Rng & Units 07/31/2023   10:43 AM 07/26/2023    9:44 AM 07/25/2023    6:06 AM  CMP  Glucose 70 - 99 mg/dL 87  324  401  BUN 8 - 23 mg/dL 36  42  48   Creatinine 0.44 - 1.00 mg/dL 5.36  6.44  0.34   Sodium 135 - 145 mmol/L 137  136  136   Potassium 3.5 - 5.1 mmol/L 4.5  4.0  3.8   Chloride 98 - 111 mmol/L 105  107  108   CO2 22 - 32 mmol/L 20  20  21    Calcium 8.9 - 10.3 mg/dL 8.6  8.7  8.7   Total Protein 6.5 - 8.1 g/dL 6.4     Total Bilirubin 0.0 - 1.2 mg/dL 0.6     Alkaline Phos 38 - 126 U/L 83     AST 15 - 41 U/L 19     ALT 0 - 44 U/L 16       ? Impression/Recommendation MRSA bacteremia with no clear source Pas th/o lumar surgical site infection in 2024 but that has healed completley  TEE neg MRI lumbar spine - no discitis or osteo Has spinal hardware After getting a week of IV vanco in the hospital , she is currently on linezolid for MRSA bacteremia with 12 days left in a 4-week course. Admitted from February 1st to February 8th, now on oral antibiotics due to preference against IV treatment. Experiencing significant diarrhea (5-6 times/day) and sore mouth, likely from linezolid. Blood tests on February 13th showed hemoglobin 9.3, platelets 406, and WBC 8.6. Discussed with daughter and we decided to switch to one dose of  dalbavancin to be given tomorrow, , considering kidney function she will get 1 gram IV This is a long acting glcopeptide and she will not need any further medications. Appt made for tomorrow in the day surgery at 11.30 am  . Provide stool collection kit for Clostridium difficile testing.  CKD- followed by [facility]- will ask him to check her Renal function in 2 weeks or when he would see her  Anemia- Dr.lateef has referred her to Dr.Fineegan  Follow PRN with me- may do blood cultures some time after 4 weeks   ?Discussed the management with patient, her husband and her daughter Care coordinated with day surgery ? ___I have personally spent  ---minutes involved in face-to-face and non-face-to-face activities for this patient on the day of the visit. Professional time spent includes the following activities: Preparing to see the patient (review of tests), Obtaining and/or reviewing separately obtained history (admission/discharge record), Performing a medically appropriate examination and/or evaluation , Ordering medications/tests/procedures, referring and communicating with other health care professionals, Documenting clinical information in the EMR, Independently interpreting results (not separately reported), Communicating results to the patient/family/caregiver, Counseling and educating the patient/family/caregiver and Care coordination (not separately reported).    ________________________________________________ Discussed with patient, requesting provider Note:  This document was prepared using Dragon voice recognition software and may include unintentional dictation errors.

## 2023-08-05 NOTE — Patient Instructions (Signed)
Today, we discussed your ongoing symptoms following your recent hospital discharge for MRSA bacteremia. You are currently on linezolid and experiencing significant diarrhea, a sore mouth, and weakness. We also reviewed your recent blood work which was okay  YOUR PLAN:  -MRSA BACTEREMIA: MRSA bacteremia is a bloodstream infection caused by a type of bacteria resistant to many antibiotics. You are currently on linezolid with 12 days left in your treatment. We discussed the possibility of switching to a different antibiotic, dalbavancin, to reduce side effects. We will coordinate with Dr. Orlie Dakin to check your white blood cell count and kidney function on Friday. A stool test will be done to rule out Clostridium difficile infection.  -DIARRHEA: Diarrhea is frequent, loose, or watery bowel movements. You are experiencing severe diarrhea likely due to the antibiotic linezolid. We provided a stool collection kit to test for Clostridium difficile infection . If the diarrhea persists, we may switch your antibiotic to dalbavancin.  -SORE MOUTH: A sore mouth can be a side effect of certain medications. you are on Nysttain for  candidiasis of the mouth . We discussed the possibility of switching to dalbavancin if the sore mouth continues.  -GENERAL HEALTH MAINTENANCE: You have successfully reduced your smoking from three packs a day to five or six cigarettes daily. There is no nausea or vomiting, but the metallic taste from the antibiotic affects your appetite. We encourage you to continue your efforts to quit smoking and to take your antibiotics after meals to help with the metallic taste.  INSTRUCTIONS:  I will coordinate with Dr. Orlie Dakin for blood work on Friday. We will discuss the results and the next steps with you and your daughter before you leave.  We have scheduled for you to have IV antibiotic tomorrow at the day surgery at 11.30 am

## 2023-08-06 ENCOUNTER — Ambulatory Visit
Admission: RE | Admit: 2023-08-06 | Discharge: 2023-08-06 | Disposition: A | Discharge status: Home / Self Care | Payer: PPO | Source: Ambulatory Visit | Attending: Infectious Diseases | Admitting: Infectious Diseases

## 2023-08-06 ENCOUNTER — Ambulatory Visit: Payer: PPO | Admitting: Podiatry

## 2023-08-06 DIAGNOSIS — T8189XA Other complications of procedures, not elsewhere classified, initial encounter: Secondary | ICD-10-CM | POA: Insufficient documentation

## 2023-08-06 DIAGNOSIS — X58XXXA Exposure to other specified factors, initial encounter: Secondary | ICD-10-CM | POA: Insufficient documentation

## 2023-08-06 IMAGING — US US ABDOMEN LIMITED
1 series · 14 of 25 positions shown · non-contrast
Comparison: CT abdomen/pelvis 11/09/2021

CLINICAL DATA: Pancreatitis.

EXAM:
ULTRASOUND ABDOMEN LIMITED RIGHT UPPER QUADRANT

[Series 1: us abdomen limited ruq (liver/gb) · 14 of 30 slices shown]
[im 1/30]
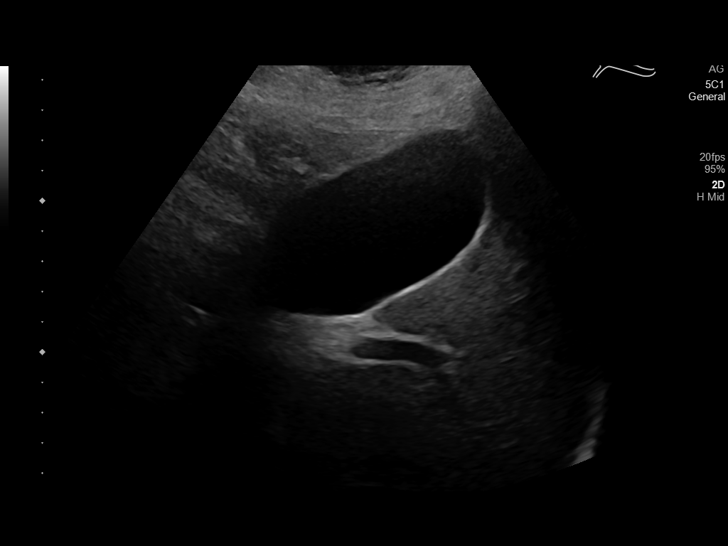
[im 3/30]
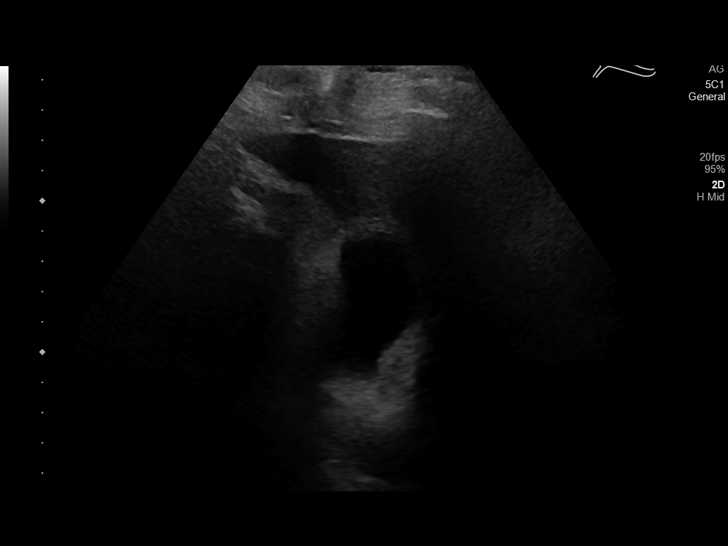
[im 5/30]
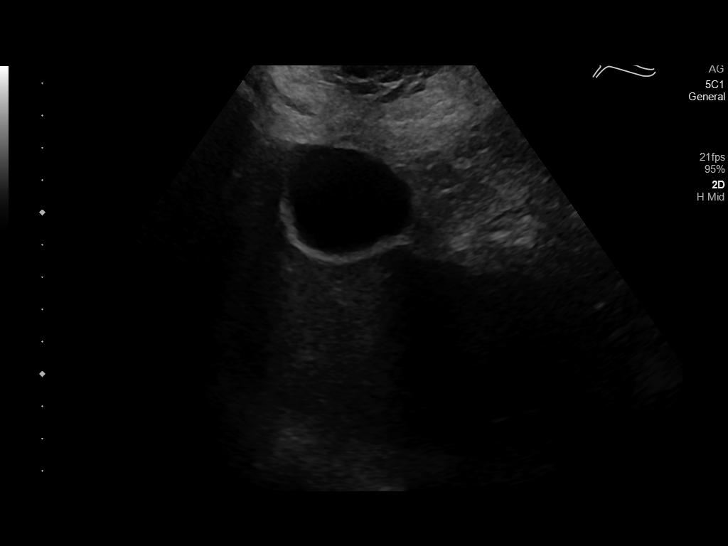
[im 8/30]
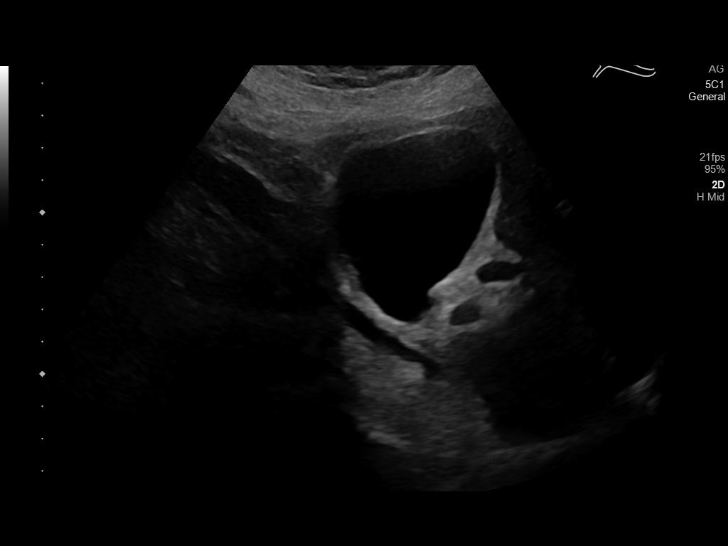
[im 10/30]
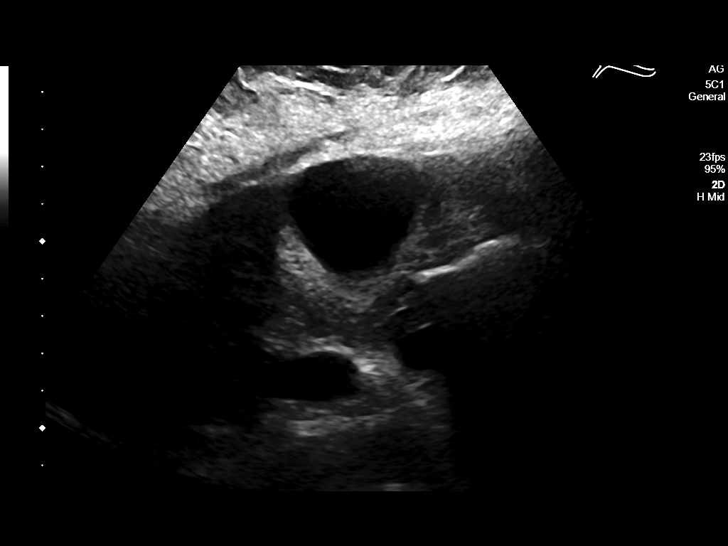
[im 11/30]
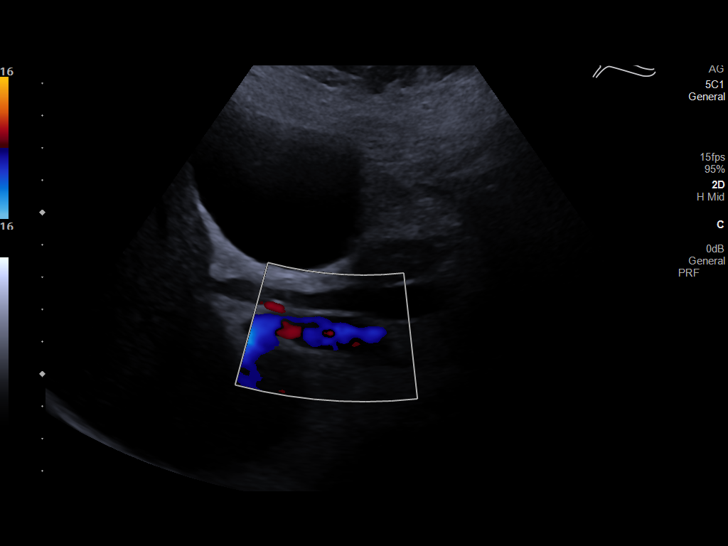
[im 14/30]
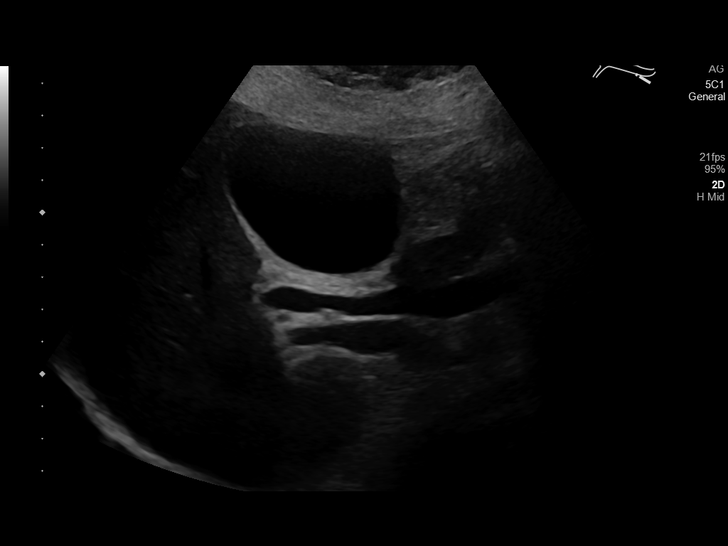
[im 16/30]
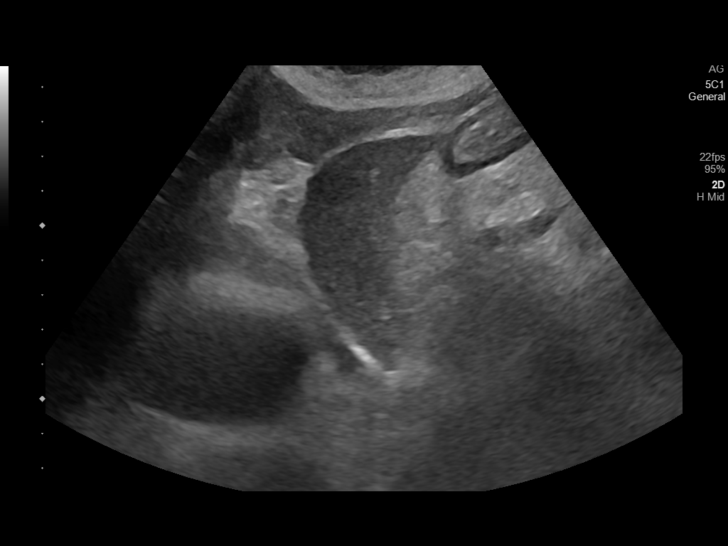
[im 19/30]
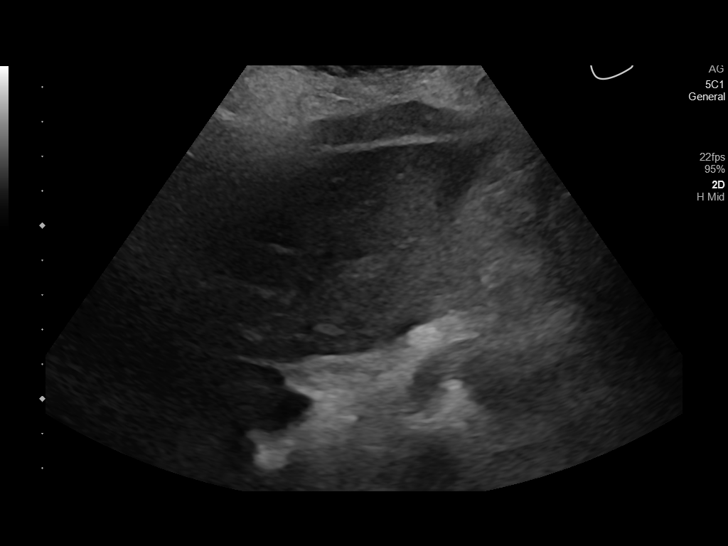
[im 20/30]
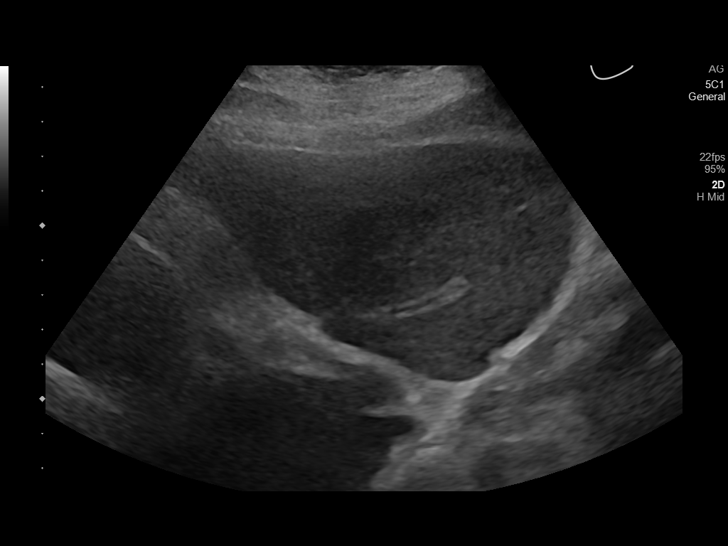
[im 22/30]
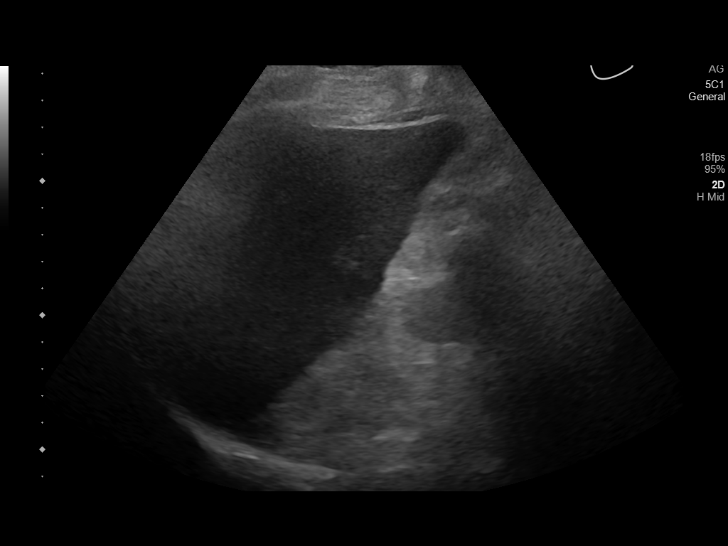
[im 25/30]
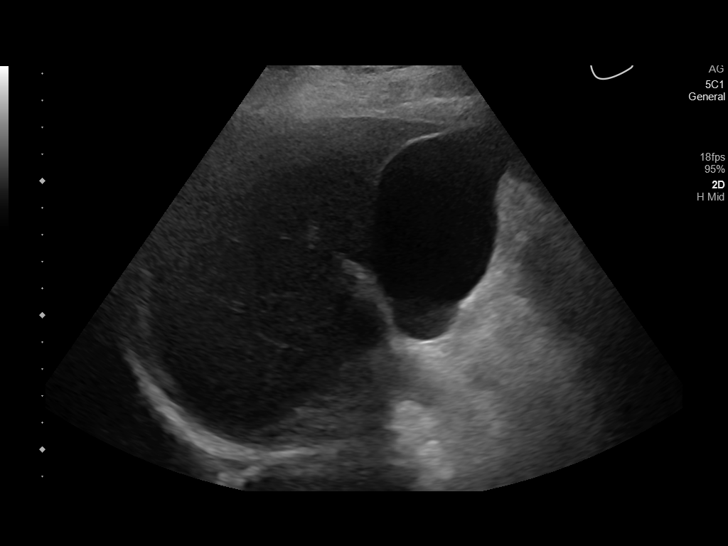
[im 27/30]
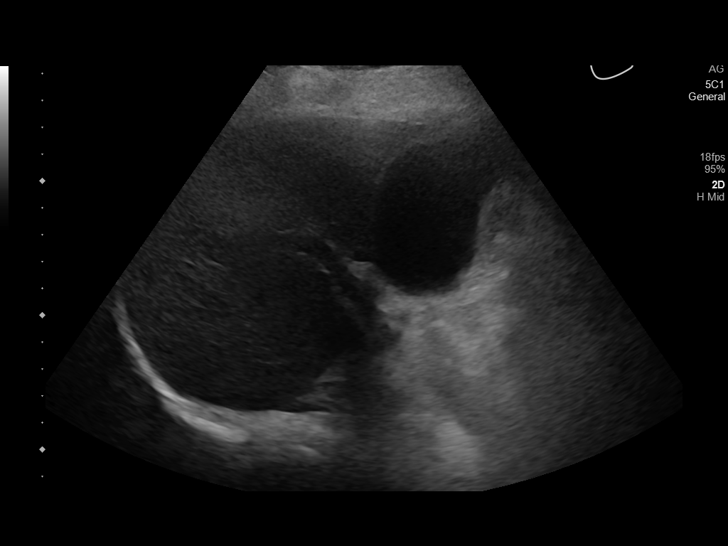
[im 30/30]
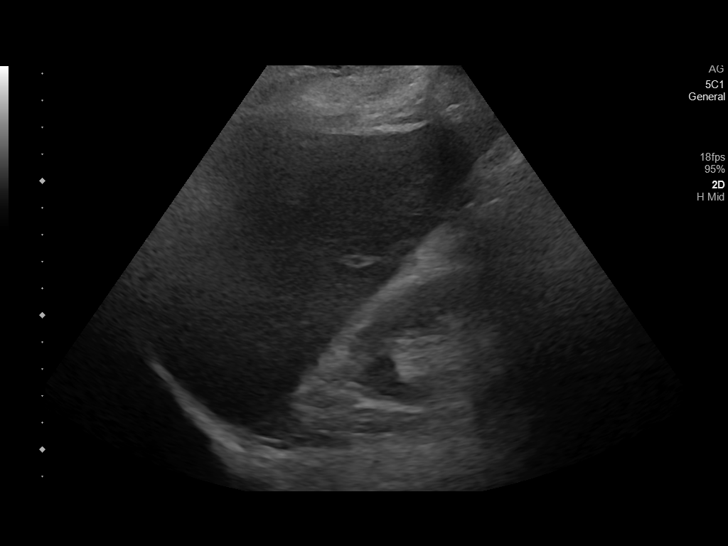

[14 of 25 positions shown; findings below may reference images not displayed]

FINDINGS: Gallbladder:

Gallbladder is distended without evidence of gallstones. No
sonographic Murphy sign. Gallbladder wall thickness 2 mm.

Common bile duct:

Diameter: 8 mm.

Liver:

Diffusely increased echogenicity without focal mass lesion evident.
Portal vein is patent on color Doppler imaging with normal direction
of blood flow towards the liver.

Other: None.
IMPRESSION: 1. Distended gallbladder without evidence for gallstones or
gallbladder wall thickening.
2. Mildly dilated common bile duct. Correlation with liver function
test recommended.

## 2023-08-06 MED ORDER — DEXTROSE 5 % IV SOLN
1000.0000 mg | Freq: Once | INTRAVENOUS | Status: AC
  Administered 2023-08-06: 1000 mg via INTRAVENOUS
  Filled 2023-08-06: qty 50

## 2023-08-07 ENCOUNTER — Inpatient Hospital Stay: Payer: PPO | Admitting: Infectious Diseases

## 2023-08-08 ENCOUNTER — Encounter: Payer: Self-pay | Admitting: Oncology

## 2023-08-08 ENCOUNTER — Other Ambulatory Visit
Admission: RE | Admit: 2023-08-08 | Discharge: 2023-08-08 | Disposition: A | Discharge status: Home / Self Care | Payer: PPO | Source: Ambulatory Visit | Attending: Infectious Diseases | Admitting: Infectious Diseases

## 2023-08-08 ENCOUNTER — Encounter: Payer: Self-pay | Admitting: Infectious Diseases

## 2023-08-08 ENCOUNTER — Inpatient Hospital Stay: Payer: PPO | Attending: Oncology | Admitting: Oncology

## 2023-08-08 VITALS — BP 195/67 | HR 82 | Temp 97.5°F | Resp 18 | Ht 67.0 in | Wt 163.3 lb

## 2023-08-08 DIAGNOSIS — R911 Solitary pulmonary nodule: Secondary | ICD-10-CM | POA: Diagnosis not present

## 2023-08-08 DIAGNOSIS — D649 Anemia, unspecified: Secondary | ICD-10-CM | POA: Diagnosis not present

## 2023-08-08 DIAGNOSIS — R197 Diarrhea, unspecified: Secondary | ICD-10-CM | POA: Insufficient documentation

## 2023-08-08 DIAGNOSIS — D631 Anemia in chronic kidney disease: Secondary | ICD-10-CM | POA: Diagnosis not present

## 2023-08-08 DIAGNOSIS — N289 Disorder of kidney and ureter, unspecified: Secondary | ICD-10-CM | POA: Diagnosis not present

## 2023-08-08 DIAGNOSIS — N184 Chronic kidney disease, stage 4 (severe): Secondary | ICD-10-CM

## 2023-08-08 DIAGNOSIS — T847XXD Infection and inflammatory reaction due to other internal orthopedic prosthetic devices, implants and grafts, subsequent encounter: Secondary | ICD-10-CM

## 2023-08-08 LAB — C DIFFICILE QUICK SCREEN W PCR REFLEX
C Diff antigen: NEGATIVE
C Diff interpretation: NOT DETECTED
C Diff toxin: NEGATIVE

## 2023-08-08 NOTE — Progress Notes (Signed)
[facility]  [phone] [phone]  ID: ARLETTA LUMADUE OB: Apr 26, 1946  MR#: [id number]  MWU#:[id number]  Patient Care Team: [name], MD as PCP - General (Family Medicine) [name], MD as Consulting Physician (Oncology)  CHIEF COMPLAINT: Left upper lobe pulmonary nodule consistent with underlying malignancy.  Anemia secondary to chronic renal insufficiency.  INTERVAL HISTORY: Patient returns to clinic today for for hospital follow-up and discussion of her anemia.  She has increased fatigue from her admission for pneumonia and is not fully back to her baseline.  She otherwise feels well. She has no neurologic complaints.  She denies any recent fevers or illnesses.  She has a good appetite and denies weight loss.  She has no chest pain, shortness of breath, cough, or hemoptysis.  She denies any nausea, vomiting, constipation, or diarrhea.  She has no urinary complaints.  Patient offers no further specific complaints today.  REVIEW OF SYSTEMS:   Review of Systems  Constitutional:  Positive for malaise/fatigue. Negative for fever and weight loss.  Respiratory: Negative.  Negative for cough, hemoptysis and shortness of breath.   Cardiovascular: Negative.  Negative for chest pain and leg swelling.  Gastrointestinal: Negative.  Negative for abdominal pain.  Genitourinary: Negative.  Negative for dysuria.  Musculoskeletal: Negative.  Negative for back pain.  Skin: Negative.  Negative for rash.  Neurological: Negative.  Negative for dizziness, focal weakness, weakness and headaches.  Psychiatric/Behavioral: Negative.  The patient is not nervous/anxious.     As per HPI. Otherwise, a complete review of systems is negative.  PAST MEDICAL HISTORY: Past Medical History:  Diagnosis Date   Anemia    Arthritis    Bladder incontinence    Broken foot    Cataracts, bilateral    Chronic kidney insufficiency    stage 3b   COPD (chronic  obstructive pulmonary disease) (HCC)    wheezing   Coronary artery disease 04/25/2022   in [place]   COVID 2021   very mild case   CVA (cerebral vascular accident) (HCC) 2016   has had 3 strokes, states right side is slightlyweaker than left   Diabetes mellitus    insulin dependent, Type 2   GERD (gastroesophageal reflux disease)    HLD (hyperlipidemia)    Hx of cardiovascular stress test    a. ETT (6/13):  Ex 5:13; no ischemic changes   Hypertension    controlled on meds   Lacunar stroke of left subthalamic region [facility]) 02/2015   Leg pain    left   Lower back pain    Neuromuscular disorder (HCC)    stroke right hand tingling   Orthostatic hypotension    Osteopenia 01/2017   T score -2.0 stable from prior DEXA   Pancreatitis 10/2021   PCOS (polycystic ovarian syndrome)    Personal history of tobacco use, presenting hazards to health 01/09/2015   PONV (postoperative nausea and vomiting)    Sleep apnea    uses CPAP nightly    PAST SURGICAL HISTORY: Past Surgical History:  Procedure Laterality Date   BIOPSY  08/28/2022   Procedure: BIOPSY;  Surgeon: Kathi Der, MD;  Location: [facility];  Service: Gastroenterology;;   BOTOX INJECTION  01/15/2022   Procedure: BOTOX INJECTION;  Surgeon: Vida Rigger, MD;  Location: [facility];  Service: Gastroenterology;;   BOTOX INJECTION N/A 08/28/2022   Procedure: BOTOX INJECTION;  Surgeon: Kathi Der, MD;  Location: [facility];  Service: Gastroenterology;  Laterality: N/A;   BOTOX INJECTION Bilateral  05/27/2023   Procedure: BOTOX INJECTION;  Surgeon: Vida Rigger, MD;  Location: [facility];  Service: Gastroenterology;  Laterality: Bilateral;   BROW LIFT Bilateral 11/04/2017   Procedure: BLEPHAROPLASTY UPPER EYELID W/EXCESS SKIN;  Surgeon: Imagene Riches, MD;  Location: [facility];  Service: Ophthalmology;  Laterality: Bilateral;  DIABETES-insulin dependent uses CPAP   CARDIAC CATHETERIZATION  20 yrs ago   found nothing    CARPAL TUNNEL RELEASE Bilateral    CATARACT EXTRACTION Bilateral    ELBOW SURGERY Bilateral    ESOPHAGOGASTRODUODENOSCOPY N/A 07/25/2021   Procedure: ESOPHAGOGASTRODUODENOSCOPY (EGD);  Surgeon: Toledo, Boykin Nearing, MD;  Location: [facility];  Service: Gastroenterology;  Laterality: N/A;  IDDM   ESOPHAGOGASTRODUODENOSCOPY N/A 01/15/2022   Procedure: ESOPHAGOGASTRODUODENOSCOPY (EGD);  Surgeon: Vida Rigger, MD;  Location: [facility];  Service: Gastroenterology;  Laterality: N/A;  botox   ESOPHAGOGASTRODUODENOSCOPY (EGD) WITH PROPOFOL N/A 08/28/2022   Procedure: ESOPHAGOGASTRODUODENOSCOPY (EGD) WITH PROPOFOL;  Surgeon: Kathi Der, MD;  Location: [facility];  Service: Gastroenterology;  Laterality: N/A;   ESOPHAGOGASTRODUODENOSCOPY (EGD) WITH PROPOFOL Bilateral 05/27/2023   Procedure: ESOPHAGOGASTRODUODENOSCOPY (EGD) WITH PROPOFOL;  Surgeon: Vida Rigger, MD;  Location: [facility];  Service: Gastroenterology;  Laterality: Bilateral;   FOOT SURGERY     Groin Abscess     HAND SURGERY     KNEE SURGERY Bilateral    LABIAL ABSCESS     LUMBAR LAMINECTOMY/DECOMPRESSION MICRODISCECTOMY Left 05/11/2018   Procedure: LUMBAR LAMINECTOMY/DECOMPRESSION MICRODISCECTOMY 1 LEVEL- L4-5;  Surgeon: Lucy Chris, MD;  Location: [facility];  Service: Neurosurgery;  Laterality: Left;   LUMBAR LAMINECTOMY/DECOMPRESSION MICRODISCECTOMY Left 05/20/2022   Procedure: MICRODISCECTOMY L3-4;  Surgeon: Tressie Stalker, MD;  Location: [facility] OR;  Service: Neurosurgery;  Laterality: Left;  3C   LUMBAR WOUND DEBRIDEMENT N/A 08/08/2022   Procedure: INCISION AND DRAINAGE OF LUMBAR WOUND;  Surgeon: Tressie Stalker, MD;  Location: [facility] OR;  Service: Neurosurgery;  Laterality: N/A;  3C   OOPHORECTOMY     BSO   PUBO VAG SLING     SHOULDER SURGERY     bilateral arthroscopies   TEE WITHOUT CARDIOVERSION N/A 07/25/2023   Procedure: TRANSESOPHAGEAL ECHOCARDIOGRAM (TEE);  Surgeon: Tiajuana Amass, MD;  Location: [facility];  Service:  Cardiovascular;  Laterality: N/A;   VAGINAL HYSTERECTOMY  1979    FAMILY HISTORY: Family History  Problem Relation Age of Onset   Hypertension Mother    Heart disease Mother 91       MI   Diabetes Father    Diabetes Sister    Hypertension Sister    Diabetes Brother    Hypertension Brother    Heart disease Brother 66       CAD   Cancer Sister        Multiple myloma   Diabetes Brother     ADVANCED DIRECTIVES (Y/N):  N  HEALTH MAINTENANCE: Social History   Tobacco Use   Smoking status: Every Day    Current packs/day: 1.50    Average packs/day: 1.5 packs/day for 50.0 years (75.0 ttl pk-yrs)    Types: Cigarettes   Smokeless tobacco: Never   Tobacco comments:    3ppd x 10 year, then cut back to 1.5pdd since 02/2015  Vaping Use   Vaping status: Never Used  Substance Use Topics   Alcohol use: Never    Alcohol/week: 0.0 standard drinks of alcohol   Drug use: No     Colonoscopy:  PAP:  Bone density:  Lipid panel:  Allergies  Allergen Reactions   Iodine Anaphylaxis and Other (  See Comments)    Pt states that she is allergic to ingested iodine only, okay for betadine.     Iodine I 131 Tositumomab Anaphylaxis   Shellfish Allergy Anaphylaxis   Codeine Nausea And Vomiting   Morphine Sulfate Nausea And Vomiting   Irbesartan Other (See Comments)     Unknown  (Avapro)   Sulfa Antibiotics Other (See Comments)    Fever     Current Outpatient Medications  Medication Sig Dispense Refill   acetaminophen (TYLENOL) 500 MG tablet Take 1,000 mg by mouth every 6 (six) hours as needed for moderate pain.     albuterol (VENTOLIN HFA) 108 (90 Base) MCG/ACT inhaler Inhale 2 puffs into the lungs every 6 (six) hours as needed for wheezing or shortness of breath.     amLODipine (NORVASC) 10 MG tablet Take 1 tablet (10 mg total) by mouth daily. 30 tablet 2   aspirin EC 81 MG tablet Take 81 mg by mouth at bedtime. Swallow whole.     BD INSULIN SYRINGE U/F 31G X 5/16" 1 ML MISC USE 1  SYRINGE AS DIRECTED     calcitRIOL (ROCALTROL) 0.25 MCG capsule Take 0.25 mcg by mouth at bedtime.     cholecalciferol (VITAMIN D3) 25 MCG (1000 UNIT) tablet Take 1,000 Units by mouth daily.     Continuous Glucose Sensor (FREESTYLE LIBRE 2 SENSOR) MISC      cyclobenzaprine (FLEXERIL) 10 MG tablet Take 1 tablet (10 mg total) by mouth 3 (three) times daily as needed for muscle spasms. 30 tablet 0   D-MANNOSE PO Take 2 tablets by mouth at bedtime.     diazepam (VALIUM) 5 MG tablet Take 5 mg by mouth 2 (two) times daily as needed.     diphenhydrAMINE (BENADRYL) 25 MG tablet Take 50 mg by mouth at bedtime as needed for sleep.     docusate sodium (COLACE) 100 MG capsule Take 1 capsule (100 mg total) by mouth 2 (two) times daily. (Patient taking differently: Take 100 mg by mouth at bedtime as needed for mild constipation or moderate constipation.) 30 capsule 0   epoetin alfa-epbx (RETACRIT) 4000 UNIT/ML injection Inject 1 mL (4,000 Units total) into the skin once a week. 4 mL 3   estradiol (ESTRACE) 0.1 MG/GM vaginal cream Place 1 Applicatorful vaginally 3 (three) times a week.     Fe Fum-Vit C-Vit B12-FA (TRIGELS-F FORTE) CAPS capsule Take 1 capsule by mouth daily after breakfast. 90 capsule 0   feeding supplement (BOOST HIGH PROTEIN) LIQD Take 1 Container by mouth 2 (two) times daily between meals.     insulin detemir (LEVEMIR) 100 UNIT/ML injection Inject 100-120 Units into the skin daily.     linezolid (ZYVOX) 600 MG tablet Take 1 tablet (600 mg total) by mouth 2 (two) times daily for 21 days. 42 tablet 0   losartan (COZAAR) 100 MG tablet Take 1 tablet (100 mg total) by mouth daily. (Patient taking differently: Take 100 mg by mouth at bedtime.) 30 tablet 2   metoprolol succinate (TOPROL-XL) 100 MG 24 hr tablet Take 100 mg by mouth at bedtime.     Multiple Vitamins-Minerals (PRESERVISION AREDS 2+MULTI VIT PO) Take 1 capsule by mouth daily.     nystatin (MYCOSTATIN) 100000 UNIT/ML suspension Take 5 mLs  (500,000 Units total) by mouth 4 (four) times daily. 120 mL 1   ONETOUCH ULTRA test strip 4 (four) times daily.     rosuvastatin (CRESTOR) 5 MG tablet Take 5 mg by mouth daily.  sennosides-docusate sodium (SENOKOT-S) 8.6-50 MG tablet Take 1 tablet by mouth daily as needed for constipation.     sodium bicarbonate 650 MG tablet Take 1 tablet (650 mg total) by mouth 2 (two) times daily. 100 tablet 1   sucralfate (CARAFATE) 1 g tablet Take 1 tablet (1 g total) by mouth 2 (two) times daily as needed (acid reflux).     Vibegron (GEMTESA) 75 MG TABS Take 75 mg by mouth at bedtime.     zolpidem (AMBIEN) 10 MG tablet Take 10 mg by mouth at bedtime as needed for sleep.     No current facility-administered medications for this visit.    OBJECTIVE: Vitals:   08/08/23 1112  BP: (!) 195/67  Pulse: 82  Resp: 18  Temp: (!) 97.5 F (36.4 C)  SpO2: 100%     Body mass index is 25.58 kg/m.    ECOG FS:1 - Symptomatic but completely ambulatory  General: Well-developed, well-nourished, no acute distress. Eyes: Pink conjunctiva, anicteric sclera. HEENT: Normocephalic, moist mucous membranes. Lungs: No audible wheezing or coughing. Heart: Regular rate and rhythm. Abdomen: Soft, nontender, no obvious distention. Musculoskeletal: No edema, cyanosis, or clubbing. Neuro: Alert, answering all questions appropriately. Cranial nerves grossly intact. Skin: No rashes or petechiae noted. Psych: Normal affect.  LAB RESULTS:  Lab Results  Component Value Date   NA 137 07/31/2023   K 4.5 07/31/2023   CL 105 07/31/2023   CO2 20 (L) 07/31/2023   GLUCOSE 87 07/31/2023   BUN 36 (H) 07/31/2023   CREATININE 2.77 (H) 07/31/2023   CALCIUM 8.6 (L) 07/31/2023   PROT 6.4 (L) 07/31/2023   ALBUMIN 2.7 (L) 07/31/2023   AST 19 07/31/2023   ALT 16 07/31/2023   ALKPHOS 83 07/31/2023   BILITOT 0.6 07/31/2023   GFRNONAA 17 (L) 07/31/2023   GFRAA 43 (L) 05/06/2018    Lab Results  Component Value Date   WBC 8.6  07/31/2023   NEUTROABS 6.3 07/31/2023   HGB 9.3 (L) 07/31/2023   HCT 28.9 (L) 07/31/2023   MCV 98.0 07/31/2023   PLT 406 (H) 07/31/2023     STUDIES: ECHO TEE Result Date: 07/25/2023    TRANSESOPHOGEAL ECHO REPORT   Patient Name:   SERIAH BROTZMAN Date of Exam: 07/25/2023 Medical Rec #:  [id number]      Height:       67.0 in Accession #:    [id number]     Weight:       163.8 lb Date of Birth:  10-Nov-1945      BSA:          1.858 m Patient Age:    77 years       BP:           164/57 mmHg Patient Gender: F              HR:           73 bpm. Exam Location:  [facility] Procedure: Transesophageal Echo, Cardiac Doppler and Color Doppler Indications:     Bacteremia  History:         Patient has prior history of Echocardiogram examinations, most                  recent 07/23/2023. CAD, Stroke and COPD,                  Signs/Symptoms:Bacteremia, Edema and Syncope; Risk  Factors:Hypertension, Sleep Apnea, Diabetes and Dyslipidemia.                  CKD.  Sonographer:     Cristela Blue Sonographer#2:   Mikki Harbor Referring Phys:  [id number] CARALYN HUDSON Diagnosing Phys: Tiajuana Amass PROCEDURE: The transesophogeal probe was passed without difficulty through the esophogus of the patient. Sedation performed by different physician. The patient was monitored while under deep sedation. Anesthestetic sedation was provided intravenously by Anesthesiology: 150mg  of Propofol. The patient developed no complications during the procedure.  IMPRESSIONS  1. No valvular vegetations. No significant valvular abnormalities.  2. Normal biventricular systolic function. FINDINGS  Left Ventricle: Left ventricular ejection fraction, by estimation, is 60 to 65%. The left ventricle has normal function. The left ventricle has no regional wall motion abnormalities. The left ventricular internal cavity size was normal in size. Right Ventricle: The right ventricular size is normal. No increase in right ventricular wall thickness. Right  ventricular systolic function is normal. Left Atrium: Left atrial size was normal in size. No left atrial/left atrial appendage thrombus was detected. Right Atrium: Right atrial size was normal in size. Pericardium: There is no evidence of pericardial effusion. Mitral Valve: The mitral valve is normal in structure. Trivial mitral valve regurgitation. No evidence of mitral valve stenosis. Tricuspid Valve: The tricuspid valve is normal in structure. Tricuspid valve regurgitation is trivial. Aortic Valve: The aortic valve is normal in structure. Aortic valve regurgitation is trivial. No aortic stenosis is present. Pulmonic Valve: The pulmonic valve was normal in structure. Pulmonic valve regurgitation is trivial. Aorta: The aortic root is normal in size and structure. Venous: The left upper pulmonary vein, left lower pulmonary vein, right upper pulmonary vein and right lower pulmonary vein are normal. A normal flow pattern is recorded from the left upper pulmonary vein. IAS/Shunts: No atrial level shunt detected by color flow Doppler. Akshay Pendyal Electronically signed by Tiajuana Amass Signature Date/Time: 07/25/2023/1:44:42 PM    Final    MR SACRUM SI JOINTS WO CONTRAST Result Date: 07/25/2023 CLINICAL DATA:  Lower back pain.  Infection suspected. EXAM: MRI SACRUM WITHOUT CONTRAST TECHNIQUE: Multiplanar multi-sequence MR imaging of the sacrum was performed. No intravenous contrast was administered. COMPARISON:  PET-CT 06/17/2023, PET-CT 02/03/2023, CT abdomen and pelvis 11/09/2021, MRI lumbar spine 01/13/2021 PET-CT 01/15/2023 FINDINGS: Urinary Tract: Atrophy fluid is seen within the pelvis posterior to the urinary bladder. No focal urinary bladder wall thickening is seen within the visualized portion of the urinary bladder. Bowel:  Unremarkable visualized pelvic bowel loops. Vascular/Lymphatic: No pathologically enlarged lymph nodes. No significant vascular abnormality seen. Reproductive:  The uterus appears to be  surgically absent. Musculoskeletal: Please see contemporaneous MRI lumbar spine report. Partial lumbarization of S1. Metallic susceptibility artifact from L3 through S1 bilateral transpedicular rod and screw fusion hardware. Grade 1 anterolisthesis of L5 on S1. Decreased T1 and increased T2 signal lesion measuring 11 x 8 x 10 mm (transverse by AP by craniocaudal) within the left aspect of the S3 vertebral body (sagittal series image 21; axial series 3 and series 4, image 25; coronal series 7, image 17)). This demonstrates mildly increased uptake on PET-CT 06/17/2023 that appears slightly increased from uptake on 02/03/2023 PET-CT. This appears new from MRI lumbar spine 01/13/2021. It is difficult to exclude a bone metastasis. There is no soft tissue ulcer or cortical erosion seen to suggest acute osteomyelitis. IMPRESSION: 1. No evidence of acute osteomyelitis. 2. Decreased T1 and increased T2 signal lesion measuring 11 x 8 x  10 mm within the left aspect of the S3 vertebral body. This demonstrates mildly increased uptake on PET-CT 06/17/2023 that appears slightly increased from uptake on 02/03/2023 PET-CT. This appears new from MRI lumbar spine 01/13/2021. It is difficult to exclude a bone metastasis. Electronically Signed   By: Neita Garnet M.D.   On: 07/25/2023 12:39   CT CHEST WO CONTRAST Result Date: 07/24/2023 CLINICAL DATA:  Sepsis EXAM: CT CHEST WITHOUT CONTRAST TECHNIQUE: Multidetector CT imaging of the chest was performed following the standard protocol without IV contrast. RADIATION DOSE REDUCTION: This exam was performed according to the departmental dose-optimization program which includes automated exposure control, adjustment of the mA and/or kV according to patient size and/or use of iterative reconstruction technique. COMPARISON:  PET CT 06/17/2023, CT chest 01/16/2023 FINDINGS: Cardiovascular: Normal heart size. No significant pericardial effusion. The thoracic aorta is normal in caliber. Severe  atherosclerotic plaque of the thoracic aorta. Four-vessel coronary artery calcifications. Mediastinum/Nodes: No gross hilar adenopathy, noting limited sensitivity for the detection of hilar adenopathy on this noncontrast study. Interval development of a borderline enlarged 1 cm precarinal lymph node (2:67). Borderline enlarged right paratracheal lymph node measuring 1.1 cm (2:61). No no enlarged axillary lymph nodes. Thyroid gland, trachea, and esophagus demonstrate no significant findings. Lungs/Pleura: Moderate centrilobular emphysematous changes. Diffuse bronchial wall thickening. Biapical pleural/pulmonary scarring. Stable left apical 1 x 0.9 cm pulmonary nodule (4:36). No pulmonary mass. Associated passive atelectasis of the bilateral lower lobes. Interval development of peribronchovascular ground-glass airspace opacities within the bilateral upper anterior lobes. Similar finding within the right middle lobe. Interval development of small right and trace left pleural effusions. No pneumothorax. Upper Abdomen: No acute abnormality. Musculoskeletal: No chest wall abnormality. No suspicious lytic or blastic osseous lesions. No acute displaced fracture. IMPRESSION: 1. Interval development of peribronchovascular and peripheral ground-glass airspace opacities within the bilateral upper anterior lobes. Similar finding within the right middle lobe. Finding could represent developing infection/inflammation. 2. Interval development of small right and trace left pleural effusions. 3. Interval development of borderline enlarged mediastinal lymph nodes. No gross hilar adenopathy, noting limited sensitivity for the detection of hilar adenopathy on this noncontrast study. Recommend attention on follow-up. 4. Stable left apical 1 x 0.9 cm pulmonary nodule. Low-grade adenocarcinoma not excluded. Recommend follow-up CT in 4 months or tissue sampling as noted on PET CT 06/17/2023. 5. Aortic Atherosclerosis (ICD10-I70.0) including  four-vessel coronary calcification. 6. Emphysema (ICD10-J43.9). Electronically Signed   By: Tish Frederickson M.D.   On: 07/24/2023 19:50   MR LUMBAR SPINE WO CONTRAST Result Date: 07/24/2023 CLINICAL DATA:  Low back pain, infection suspected EXAM: MRI LUMBAR SPINE WITHOUT CONTRAST TECHNIQUE: Multiplanar, multisequence MR imaging of the lumbar spine was performed. No intravenous contrast was administered. COMPARISON:  04/29/2023 MRI lumbar spine FINDINGS: Segmentation: Transitional anatomy at S1, which is partially lumbarized. The last disc space is labeled S1-S2. Alignment: 4 mm anterolisthesis of L4 on L5 and 7 mm anterolisthesis of L5 on S1, which are both fused. Dextrocurvature. Vertebrae: Mildly increased T2 signal of L3, L4, and L5, which is improved compared to 04/29/2023. Otherwise normal marrow signal. No acute fracture, evidence of discitis, or suspicious osseous lesion. Conus medullaris and cauda equina: Conus extends to the L1-L2 level. Conus and cauda equina appear normal. Paraspinal and other soft tissues: Increased T2 signal is noted in paraspinous musculature bilaterally (series 7, images 8 and 13), which appears more advanced than on the prior study, possibly denervation edema. Redemonstrated trace fluid collection in the more superficial soft  tissues (series 8, image 13 and series 5, image 10). Disc levels: T12-L1: No significant disc bulge. No spinal canal stenosis or neural foraminal narrowing. L1-L2: No significant disc bulge. No spinal canal stenosis or neural foraminal narrowing. L2-L3: Mild disc bulge. Mild facet arthropathy. Narrowing of the left lateral recess. No spinal canal stenosis. Mild left neural foraminal narrowing. L3-L4: Status post fusion and decompression. No significant spinal canal stenosis or neural foraminal narrowing. L4-L5: Fused anterolisthesis. Status post fusion and decompression. No spinal canal stenosis. Mild moderate left neural foraminal narrowing. L5-S1: Fused  anterolisthesis. Status post fusion and decompression. No spinal canal stenosis or neural foraminal narrowing. IMPRESSION: 1. Status post fusion and decompression at L3-L4, L4-L5, and L5-S1. No spinal canal stenosis at these levels. Mild-to-moderate left neural foraminal narrowing at L4-L5. 2. L2-L3 mild left neural foraminal narrowing. Narrowing of the left lateral recess at this level could affect the descending left L3 nerve roots. 3. Increased T2 signal in paraspinous musculature bilaterally, which appears more advanced than on the prior study, possibly denervation edema or myositis. 4. No evidence of discitis or osteomyelitis. Electronically Signed   By: Wiliam Ke M.D.   On: 07/24/2023 17:47   ECHOCARDIOGRAM COMPLETE Result Date: 07/23/2023    ECHOCARDIOGRAM REPORT   Patient Name:   ARLANDA SHIPLETT Date of Exam: 07/23/2023 Medical Rec #:  [id number]      Height:       67.0 in Accession #:    [id number]     Weight:       163.8 lb Date of Birth:  1946-04-02      BSA:          1.858 m Patient Age:    77 years       BP:           172/70 mmHg Patient Gender: F              HR:           73 bpm. Exam Location:  [facility] Procedure: 2D Echo, Cardiac Doppler and Color Doppler Indications:     Bacteremia R78.81  History:         Patient has prior history of Echocardiogram examinations, most                  recent 03/16/2015. COPD; Risk Factors:Dyslipidemia and                  Hypertension.  Sonographer:     Cristela Blue Referring Phys:  [id number] JENNIFER YATES Diagnosing Phys: Julien Nordmann MD IMPRESSIONS  1. Left ventricular ejection fraction, by estimation, is 55 to 60%. Left ventricular ejection fraction by PLAX is 54 %. The left ventricle has normal function. The left ventricle has no regional wall motion abnormalities. Left ventricular diastolic parameters are indeterminate.  2. Right ventricular systolic function is normal. The right ventricular size is normal.  3. The mitral valve is normal in structure. Mild mitral  valve regurgitation. No evidence of mitral stenosis.  4. The aortic valve is normal in structure. Aortic valve regurgitation is not visualized. No aortic stenosis is present.  5. The inferior vena cava is normal in size with greater than 50% respiratory variability, suggesting right atrial pressure of 3 mmHg. FINDINGS  Left Ventricle: Left ventricular ejection fraction, by estimation, is 55 to 60%. Left ventricular ejection fraction by PLAX is 54 %. The left ventricle has normal function. The left ventricle has no regional wall motion abnormalities. The left ventricular  internal cavity size was normal in size. There is no left ventricular hypertrophy. Left ventricular diastolic parameters are indeterminate. Right Ventricle: The right ventricular size is normal. No increase in right ventricular wall thickness. Right ventricular systolic function is normal. Left Atrium: Left atrial size was normal in size. Right Atrium: Right atrial size was normal in size. Pericardium: There is no evidence of pericardial effusion. Mitral Valve: The mitral valve is normal in structure. Mild mitral valve regurgitation. No evidence of mitral valve stenosis. MV peak gradient, 7.5 mmHg. The mean mitral valve gradient is 3.0 mmHg. There is no evidence of mitral valve vegetation. Tricuspid Valve: The tricuspid valve is normal in structure. Tricuspid valve regurgitation is mild . No evidence of tricuspid stenosis. Aortic Valve: The aortic valve is normal in structure. Aortic valve regurgitation is not visualized. No aortic stenosis is present. Aortic valve mean gradient measures 4.0 mmHg. Aortic valve peak gradient measures 8.0 mmHg. Aortic valve area, by VTI measures 1.60 cm. Pulmonic Valve: The pulmonic valve was normal in structure. Pulmonic valve regurgitation is not visualized. No evidence of pulmonic stenosis. Aorta: The aortic root is normal in size and structure. Venous: The inferior vena cava is normal in size with greater than 50%  respiratory variability, suggesting right atrial pressure of 3 mmHg. IAS/Shunts: No atrial level shunt detected by color flow Doppler.  LEFT VENTRICLE PLAX 2D LV EF:         Left            Diastology                ventricular     LV e' medial:    5.55 cm/s                ejection        LV E/e' medial:  23.4                fraction by     LV e' lateral:   13.50 cm/s                PLAX is 54      LV E/e' lateral: 9.6                %. LVIDd:         5.30 cm LVIDs:         3.80 cm LV PW:         0.90 cm LV IVS:        0.90 cm LVOT diam:     2.00 cm LV SV:         53 LV SV Index:   28 LVOT Area:     3.14 cm  RIGHT VENTRICLE RV Basal diam:  3.20 cm RV Mid diam:    2.50 cm RV S prime:     14.10 cm/s TAPSE (M-mode): 3.1 cm LEFT ATRIUM         Index       RIGHT ATRIUM           Index LA diam:    3.80 cm 2.05 cm/m  RA Area:     17.60 cm                                 RA Volume:   46.30 ml  24.92 ml/m  AORTIC VALVE AV Area (Vmax):    1.70 cm AV Area (Vmean):   1.73  cm AV Area (VTI):     1.60 cm AV Vmax:           141.50 cm/s AV Vmean:          97.300 cm/s AV VTI:            0.330 m AV Peak Grad:      8.0 mmHg AV Mean Grad:      4.0 mmHg LVOT Vmax:         76.60 cm/s LVOT Vmean:        53.600 cm/s LVOT VTI:          0.168 m LVOT/AV VTI ratio: 0.51  AORTA Ao Root diam: 2.70 cm MITRAL VALVE                TRICUSPID VALVE MV Area (PHT): 4.96 cm     TR Peak grad:   24.0 mmHg MV Area VTI:   1.52 cm     TR Vmax:        245.00 cm/s MV Peak grad:  7.5 mmHg MV Mean grad:  3.0 mmHg     SHUNTS MV Vmax:       1.37 m/s     Systemic VTI:  0.17 m MV Vmean:      85.4 cm/s    Systemic Diam: 2.00 cm MV Decel Time: 153 msec MV E velocity: 130.00 cm/s MV A velocity: 92.80 cm/s MV E/A ratio:  1.40 Julien Nordmann MD Electronically signed by Julien Nordmann MD Signature Date/Time: 07/23/2023/11:26:53 AM    Final    DG Chest Port 1 View Result Date: 07/19/2023 CLINICAL DATA:  Fever and cough, questionable sepsis EXAM: PORTABLE CHEST 1  VIEW COMPARISON:  PET/CT 06/17/2023 hand radiograph 05/06/2018 FINDINGS: Stable cardiomediastinal silhouette. Aortic atherosclerotic calcification. Hyperinflation and chronic bronchitic change. Interstitial coarsening and patchy airspace opacities greatest in the right mid and lower lung. No pleural effusion pneumothorax. IMPRESSION: Patchy airspace opacities and interstitial coarsening greatest in the right mid and lower lung concerning for infection or aspiration. Emphysema. Electronically Signed   By: Minerva Fester M.D.   On: 07/19/2023 21:10    ASSESSMENT: Left upper lobe pulmonary nodule consistent with underlying malignancy  PLAN:    Left upper lobe pulmonary nodule consistent with underlying malignancy: No biopsy was performed.  Given the hypermetabolism on PET scan from February 02, 2023, patient elected to pursue SBRT.  She completed treatment on April 07, 2023.  PET scan results on June 17, 2023 reviewed independently with no obvious evidence of recurrent or progressive disease.  No further intervention is needed at this time.  Patient has been instructed to keep her follow-up appointment in July 2025 with CT scan and further evaluation. 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  No further interventions are needed. Hypertension: Patient blood pressure remains significantly elevated today.  Continue monitoring and treatment per primary care. Renal insufficiency: Patient's creatinine continues to trend up and is now 2.77.  Follow-up with nephrology in March 2025 as scheduled.   Anemia secondary to renal insufficiency: Iron stores on July 23, 2023 were within normal limits.  Patient received 4000 units Retacrit while in the hospital and her hemoglobin has improved to 9.7.  No intervention is needed at this time.  Patient may benefit from additional Retacrit in the future if her hemoglobin remains persistently below 10.0.  Follow-up in July 2025 as above.   Patient  expressed understanding and was in agreement with this plan. She also understands that She can call  clinic at any time with any questions, concerns, or complaints.    [name], MD   08/08/2023 11:34 AM

## 2023-08-11 DIAGNOSIS — N3020 Other chronic cystitis without hematuria: Secondary | ICD-10-CM | POA: Diagnosis not present

## 2023-08-13 ENCOUNTER — Ambulatory Visit: Payer: PPO | Admitting: Radiation Oncology

## 2023-08-14 DIAGNOSIS — F17210 Nicotine dependence, cigarettes, uncomplicated: Secondary | ICD-10-CM | POA: Diagnosis not present

## 2023-08-14 DIAGNOSIS — J440 Chronic obstructive pulmonary disease with acute lower respiratory infection: Secondary | ICD-10-CM | POA: Diagnosis not present

## 2023-08-14 DIAGNOSIS — R918 Other nonspecific abnormal finding of lung field: Secondary | ICD-10-CM | POA: Diagnosis not present

## 2023-08-14 DIAGNOSIS — G4733 Obstructive sleep apnea (adult) (pediatric): Secondary | ICD-10-CM | POA: Diagnosis not present

## 2023-08-15 DIAGNOSIS — L538 Other specified erythematous conditions: Secondary | ICD-10-CM | POA: Diagnosis not present

## 2023-08-15 DIAGNOSIS — B078 Other viral warts: Secondary | ICD-10-CM | POA: Diagnosis not present

## 2023-08-20 ENCOUNTER — Telehealth: Payer: Self-pay

## 2023-08-20 NOTE — Telephone Encounter (Signed)
-----   Message from Lynn Ito sent at 08/19/2023  7:16 PM EST ----- Regarding: Follow up She had sent a message wondering whether she needs follow up. Mckenzie Lawrence forwarded it to me she needs repeat blood culture mid March. No follow up needed unless the result is abnormal. Thanks.

## 2023-08-25 ENCOUNTER — Encounter: Payer: Self-pay | Admitting: Podiatry

## 2023-08-25 ENCOUNTER — Ambulatory Visit (INDEPENDENT_AMBULATORY_CARE_PROVIDER_SITE_OTHER): Payer: PPO | Admitting: Podiatry

## 2023-08-25 DIAGNOSIS — E1142 Type 2 diabetes mellitus with diabetic polyneuropathy: Secondary | ICD-10-CM | POA: Diagnosis not present

## 2023-08-25 DIAGNOSIS — D689 Coagulation defect, unspecified: Secondary | ICD-10-CM | POA: Diagnosis not present

## 2023-08-25 DIAGNOSIS — M79676 Pain in unspecified toe(s): Secondary | ICD-10-CM

## 2023-08-25 DIAGNOSIS — B351 Tinea unguium: Secondary | ICD-10-CM

## 2023-08-25 NOTE — Progress Notes (Signed)
 Presents today for follow-up of her diabetic feet.  States that her diabetes is doing good recently in the hospital however with pneumonia.  Current hemoglobin A1c is at 6.1 and she takes blood thinner.  Objective: Vital signs are stable alert oriented x 3 there is no erythema edema/drainage or odor toenails are long thick yellow dystrophic with mycotic no open lesions or wounds no preulcerative lesions.  Assessment: Pain in limb secondary to diabetes mellitus diabetic peripheral neuropathy and the use of blood thinner.  Toenails are painfully elongated.  Plan: Debrided painful mycotic nails 1 through 5 bilateral.  This is a covered service secondary to blood thinner.

## 2023-08-27 DIAGNOSIS — N2581 Secondary hyperparathyroidism of renal origin: Secondary | ICD-10-CM | POA: Diagnosis not present

## 2023-08-27 DIAGNOSIS — N1832 Chronic kidney disease, stage 3b: Secondary | ICD-10-CM | POA: Diagnosis not present

## 2023-08-27 DIAGNOSIS — N184 Chronic kidney disease, stage 4 (severe): Secondary | ICD-10-CM | POA: Diagnosis not present

## 2023-08-27 DIAGNOSIS — E871 Hypo-osmolality and hyponatremia: Secondary | ICD-10-CM | POA: Diagnosis not present

## 2023-08-27 DIAGNOSIS — I10 Essential (primary) hypertension: Secondary | ICD-10-CM | POA: Diagnosis not present

## 2023-08-27 DIAGNOSIS — E1122 Type 2 diabetes mellitus with diabetic chronic kidney disease: Secondary | ICD-10-CM | POA: Diagnosis not present

## 2023-08-27 DIAGNOSIS — R809 Proteinuria, unspecified: Secondary | ICD-10-CM | POA: Diagnosis not present

## 2023-08-27 DIAGNOSIS — D631 Anemia in chronic kidney disease: Secondary | ICD-10-CM | POA: Diagnosis not present

## 2023-08-28 ENCOUNTER — Other Ambulatory Visit
Admission: RE | Admit: 2023-08-28 | Discharge: 2023-08-28 | Disposition: A | Discharge status: Home / Self Care | Attending: Infectious Diseases | Admitting: Infectious Diseases

## 2023-08-28 DIAGNOSIS — N2581 Secondary hyperparathyroidism of renal origin: Secondary | ICD-10-CM | POA: Diagnosis not present

## 2023-08-28 DIAGNOSIS — N184 Chronic kidney disease, stage 4 (severe): Secondary | ICD-10-CM | POA: Diagnosis not present

## 2023-08-28 DIAGNOSIS — T847XXD Infection and inflammatory reaction due to other internal orthopedic prosthetic devices, implants and grafts, subsequent encounter: Secondary | ICD-10-CM | POA: Diagnosis not present

## 2023-08-28 DIAGNOSIS — I10 Essential (primary) hypertension: Secondary | ICD-10-CM | POA: Diagnosis not present

## 2023-08-28 DIAGNOSIS — D631 Anemia in chronic kidney disease: Secondary | ICD-10-CM | POA: Diagnosis not present

## 2023-08-28 DIAGNOSIS — E1122 Type 2 diabetes mellitus with diabetic chronic kidney disease: Secondary | ICD-10-CM | POA: Diagnosis not present

## 2023-08-29 DIAGNOSIS — Z6825 Body mass index (BMI) 25.0-25.9, adult: Secondary | ICD-10-CM | POA: Diagnosis not present

## 2023-08-29 DIAGNOSIS — M48062 Spinal stenosis, lumbar region with neurogenic claudication: Secondary | ICD-10-CM | POA: Diagnosis not present

## 2023-09-02 LAB — CULTURE, BLOOD (SINGLE)
Culture: NO GROWTH
Culture: NO GROWTH
Special Requests: ADEQUATE
Special Requests: ADEQUATE

## 2023-09-04 ENCOUNTER — Telehealth: Payer: Self-pay

## 2023-09-04 NOTE — Telephone Encounter (Signed)
-----   Message from Lynn Ito sent at 09/02/2023  2:04 PM EDT ----- Please let the patient know that there was no MRSA in the repeat blood culture- Nothing to do now- ----- Message ----- From: Interface, Lab In [place] Sent: 08/29/2023   5:34 AM EDT To: Lynn Ito, MD

## 2023-09-08 ENCOUNTER — Ambulatory Visit: Payer: PPO | Admitting: Podiatry

## 2023-09-08 DIAGNOSIS — L538 Other specified erythematous conditions: Secondary | ICD-10-CM | POA: Diagnosis not present

## 2023-09-08 DIAGNOSIS — B078 Other viral warts: Secondary | ICD-10-CM | POA: Diagnosis not present

## 2023-09-15 DIAGNOSIS — H353221 Exudative age-related macular degeneration, left eye, with active choroidal neovascularization: Secondary | ICD-10-CM | POA: Diagnosis not present

## 2023-09-15 DIAGNOSIS — H353211 Exudative age-related macular degeneration, right eye, with active choroidal neovascularization: Secondary | ICD-10-CM | POA: Diagnosis not present

## 2023-09-29 DIAGNOSIS — I10 Essential (primary) hypertension: Secondary | ICD-10-CM | POA: Diagnosis not present

## 2023-09-29 DIAGNOSIS — J449 Chronic obstructive pulmonary disease, unspecified: Secondary | ICD-10-CM | POA: Diagnosis not present

## 2023-09-29 DIAGNOSIS — E119 Type 2 diabetes mellitus without complications: Secondary | ICD-10-CM | POA: Diagnosis not present

## 2023-09-29 DIAGNOSIS — E785 Hyperlipidemia, unspecified: Secondary | ICD-10-CM | POA: Diagnosis not present

## 2023-09-29 DIAGNOSIS — I6381 Other cerebral infarction due to occlusion or stenosis of small artery: Secondary | ICD-10-CM | POA: Diagnosis not present

## 2023-09-29 DIAGNOSIS — R002 Palpitations: Secondary | ICD-10-CM | POA: Diagnosis not present

## 2023-09-29 DIAGNOSIS — G4733 Obstructive sleep apnea (adult) (pediatric): Secondary | ICD-10-CM | POA: Diagnosis not present

## 2023-09-29 DIAGNOSIS — I639 Cerebral infarction, unspecified: Secondary | ICD-10-CM | POA: Diagnosis not present

## 2023-09-29 DIAGNOSIS — Z794 Long term (current) use of insulin: Secondary | ICD-10-CM | POA: Diagnosis not present

## 2023-09-29 DIAGNOSIS — N1832 Chronic kidney disease, stage 3b: Secondary | ICD-10-CM | POA: Diagnosis not present

## 2023-09-29 DIAGNOSIS — Z720 Tobacco use: Secondary | ICD-10-CM | POA: Diagnosis not present

## 2023-09-29 DIAGNOSIS — I2510 Atherosclerotic heart disease of native coronary artery without angina pectoris: Secondary | ICD-10-CM | POA: Diagnosis not present

## 2023-10-06 DIAGNOSIS — L720 Epidermal cyst: Secondary | ICD-10-CM | POA: Diagnosis not present

## 2023-10-06 DIAGNOSIS — L918 Other hypertrophic disorders of the skin: Secondary | ICD-10-CM | POA: Diagnosis not present

## 2023-10-06 DIAGNOSIS — L0291 Cutaneous abscess, unspecified: Secondary | ICD-10-CM | POA: Diagnosis not present

## 2023-10-06 DIAGNOSIS — L309 Dermatitis, unspecified: Secondary | ICD-10-CM | POA: Diagnosis not present

## 2023-10-06 DIAGNOSIS — R208 Other disturbances of skin sensation: Secondary | ICD-10-CM | POA: Diagnosis not present

## 2023-10-07 DIAGNOSIS — R531 Weakness: Secondary | ICD-10-CM | POA: Diagnosis not present

## 2023-10-07 DIAGNOSIS — I10 Essential (primary) hypertension: Secondary | ICD-10-CM | POA: Diagnosis not present

## 2023-10-07 DIAGNOSIS — R5383 Other fatigue: Secondary | ICD-10-CM | POA: Diagnosis not present

## 2023-10-07 DIAGNOSIS — M5416 Radiculopathy, lumbar region: Secondary | ICD-10-CM | POA: Diagnosis not present

## 2023-10-07 DIAGNOSIS — F5104 Psychophysiologic insomnia: Secondary | ICD-10-CM | POA: Diagnosis not present

## 2023-10-13 DIAGNOSIS — E871 Hypo-osmolality and hyponatremia: Secondary | ICD-10-CM | POA: Diagnosis not present

## 2023-10-13 DIAGNOSIS — I10 Essential (primary) hypertension: Secondary | ICD-10-CM | POA: Diagnosis not present

## 2023-10-13 DIAGNOSIS — E1122 Type 2 diabetes mellitus with diabetic chronic kidney disease: Secondary | ICD-10-CM | POA: Diagnosis not present

## 2023-10-13 DIAGNOSIS — R809 Proteinuria, unspecified: Secondary | ICD-10-CM | POA: Diagnosis not present

## 2023-10-13 DIAGNOSIS — N3020 Other chronic cystitis without hematuria: Secondary | ICD-10-CM | POA: Diagnosis not present

## 2023-10-13 DIAGNOSIS — N184 Chronic kidney disease, stage 4 (severe): Secondary | ICD-10-CM | POA: Diagnosis not present

## 2023-10-13 DIAGNOSIS — N2581 Secondary hyperparathyroidism of renal origin: Secondary | ICD-10-CM | POA: Diagnosis not present

## 2023-10-13 DIAGNOSIS — N1832 Chronic kidney disease, stage 3b: Secondary | ICD-10-CM | POA: Diagnosis not present

## 2023-10-13 DIAGNOSIS — D631 Anemia in chronic kidney disease: Secondary | ICD-10-CM | POA: Diagnosis not present

## 2023-10-14 DIAGNOSIS — L820 Inflamed seborrheic keratosis: Secondary | ICD-10-CM | POA: Diagnosis not present

## 2023-10-14 DIAGNOSIS — L2989 Other pruritus: Secondary | ICD-10-CM | POA: Diagnosis not present

## 2023-10-14 DIAGNOSIS — B078 Other viral warts: Secondary | ICD-10-CM | POA: Diagnosis not present

## 2023-10-14 DIAGNOSIS — L538 Other specified erythematous conditions: Secondary | ICD-10-CM | POA: Diagnosis not present

## 2023-10-14 DIAGNOSIS — L0232 Furuncle of buttock: Secondary | ICD-10-CM | POA: Diagnosis not present

## 2023-10-20 DIAGNOSIS — I10 Essential (primary) hypertension: Secondary | ICD-10-CM | POA: Diagnosis not present

## 2023-10-20 DIAGNOSIS — D631 Anemia in chronic kidney disease: Secondary | ICD-10-CM | POA: Diagnosis not present

## 2023-10-20 DIAGNOSIS — R809 Proteinuria, unspecified: Secondary | ICD-10-CM | POA: Diagnosis not present

## 2023-10-20 DIAGNOSIS — N184 Chronic kidney disease, stage 4 (severe): Secondary | ICD-10-CM | POA: Diagnosis not present

## 2023-10-20 DIAGNOSIS — N2581 Secondary hyperparathyroidism of renal origin: Secondary | ICD-10-CM | POA: Diagnosis not present

## 2023-10-20 DIAGNOSIS — E1122 Type 2 diabetes mellitus with diabetic chronic kidney disease: Secondary | ICD-10-CM | POA: Diagnosis not present

## 2023-10-27 DIAGNOSIS — R300 Dysuria: Secondary | ICD-10-CM | POA: Diagnosis not present

## 2023-10-27 DIAGNOSIS — R3914 Feeling of incomplete bladder emptying: Secondary | ICD-10-CM | POA: Diagnosis not present

## 2023-10-27 DIAGNOSIS — N3020 Other chronic cystitis without hematuria: Secondary | ICD-10-CM | POA: Diagnosis not present

## 2023-10-27 DIAGNOSIS — R351 Nocturia: Secondary | ICD-10-CM | POA: Diagnosis not present

## 2023-10-27 DIAGNOSIS — R102 Pelvic and perineal pain: Secondary | ICD-10-CM | POA: Diagnosis not present

## 2023-10-29 ENCOUNTER — Ambulatory Visit: Admitting: Podiatry

## 2023-10-29 ENCOUNTER — Encounter: Payer: Self-pay | Admitting: Podiatry

## 2023-10-29 ENCOUNTER — Ambulatory Visit (INDEPENDENT_AMBULATORY_CARE_PROVIDER_SITE_OTHER): Admitting: Podiatry

## 2023-10-29 DIAGNOSIS — E1142 Type 2 diabetes mellitus with diabetic polyneuropathy: Secondary | ICD-10-CM | POA: Diagnosis not present

## 2023-10-29 DIAGNOSIS — Z794 Long term (current) use of insulin: Secondary | ICD-10-CM | POA: Diagnosis not present

## 2023-10-29 DIAGNOSIS — D689 Coagulation defect, unspecified: Secondary | ICD-10-CM | POA: Diagnosis not present

## 2023-10-29 DIAGNOSIS — E119 Type 2 diabetes mellitus without complications: Secondary | ICD-10-CM | POA: Diagnosis not present

## 2023-10-29 DIAGNOSIS — B351 Tinea unguium: Secondary | ICD-10-CM

## 2023-10-29 DIAGNOSIS — M79676 Pain in unspecified toe(s): Secondary | ICD-10-CM

## 2023-10-29 DIAGNOSIS — I10 Essential (primary) hypertension: Secondary | ICD-10-CM | POA: Diagnosis not present

## 2023-10-29 NOTE — Progress Notes (Signed)
 Presents today for follow-up of her diabetic feet.  States that her diabetes is doing good recently in the hospital however with pneumonia.  Current hemoglobin A1c is at 6.1 and she takes blood thinner.  Objective: Vital signs are stable alert oriented x 3 there is no erythema edema/drainage or odor toenails are long thick yellow dystrophic with mycotic no open lesions or wounds no preulcerative lesions.  Assessment: Pain in limb secondary to diabetes mellitus diabetic peripheral neuropathy and the use of blood thinner.  Toenails are painfully elongated.  Plan: Debrided painful mycotic nails 1 through 5 bilateral.  This is a covered service secondary to blood thinner.

## 2023-11-05 DIAGNOSIS — Z1211 Encounter for screening for malignant neoplasm of colon: Secondary | ICD-10-CM | POA: Diagnosis not present

## 2023-11-05 DIAGNOSIS — E1122 Type 2 diabetes mellitus with diabetic chronic kidney disease: Secondary | ICD-10-CM | POA: Diagnosis not present

## 2023-11-05 DIAGNOSIS — J449 Chronic obstructive pulmonary disease, unspecified: Secondary | ICD-10-CM | POA: Diagnosis not present

## 2023-11-05 DIAGNOSIS — N185 Chronic kidney disease, stage 5: Secondary | ICD-10-CM | POA: Diagnosis not present

## 2023-11-05 DIAGNOSIS — M545 Low back pain, unspecified: Secondary | ICD-10-CM | POA: Diagnosis not present

## 2023-11-05 DIAGNOSIS — Z0000 Encounter for general adult medical examination without abnormal findings: Secondary | ICD-10-CM | POA: Diagnosis not present

## 2023-11-05 DIAGNOSIS — Z1331 Encounter for screening for depression: Secondary | ICD-10-CM | POA: Diagnosis not present

## 2023-11-05 DIAGNOSIS — I10 Essential (primary) hypertension: Secondary | ICD-10-CM | POA: Diagnosis not present

## 2023-11-05 DIAGNOSIS — N184 Chronic kidney disease, stage 4 (severe): Secondary | ICD-10-CM | POA: Diagnosis not present

## 2023-11-05 DIAGNOSIS — G8929 Other chronic pain: Secondary | ICD-10-CM | POA: Diagnosis not present

## 2023-11-05 DIAGNOSIS — I2510 Atherosclerotic heart disease of native coronary artery without angina pectoris: Secondary | ICD-10-CM | POA: Diagnosis not present

## 2023-11-05 DIAGNOSIS — Z636 Dependent relative needing care at home: Secondary | ICD-10-CM | POA: Diagnosis not present

## 2023-11-12 ENCOUNTER — Ambulatory Visit: Payer: PPO | Admitting: Radiation Oncology

## 2023-11-12 DIAGNOSIS — N184 Chronic kidney disease, stage 4 (severe): Secondary | ICD-10-CM | POA: Diagnosis not present

## 2023-11-12 DIAGNOSIS — Z794 Long term (current) use of insulin: Secondary | ICD-10-CM | POA: Diagnosis not present

## 2023-11-12 DIAGNOSIS — E1122 Type 2 diabetes mellitus with diabetic chronic kidney disease: Secondary | ICD-10-CM | POA: Diagnosis not present

## 2023-11-13 DIAGNOSIS — R2681 Unsteadiness on feet: Secondary | ICD-10-CM | POA: Diagnosis not present

## 2023-11-13 DIAGNOSIS — M79669 Pain in unspecified lower leg: Secondary | ICD-10-CM | POA: Diagnosis not present

## 2023-11-17 DIAGNOSIS — R2681 Unsteadiness on feet: Secondary | ICD-10-CM | POA: Diagnosis not present

## 2023-11-17 DIAGNOSIS — M79669 Pain in unspecified lower leg: Secondary | ICD-10-CM | POA: Diagnosis not present

## 2023-11-19 DIAGNOSIS — M79669 Pain in unspecified lower leg: Secondary | ICD-10-CM | POA: Diagnosis not present

## 2023-11-19 DIAGNOSIS — Z1211 Encounter for screening for malignant neoplasm of colon: Secondary | ICD-10-CM | POA: Diagnosis not present

## 2023-11-19 DIAGNOSIS — R2681 Unsteadiness on feet: Secondary | ICD-10-CM | POA: Diagnosis not present

## 2023-11-20 DIAGNOSIS — R2681 Unsteadiness on feet: Secondary | ICD-10-CM | POA: Diagnosis not present

## 2023-11-20 DIAGNOSIS — M79669 Pain in unspecified lower leg: Secondary | ICD-10-CM | POA: Diagnosis not present

## 2023-11-24 DIAGNOSIS — N3020 Other chronic cystitis without hematuria: Secondary | ICD-10-CM | POA: Diagnosis not present

## 2023-11-25 DIAGNOSIS — R600 Localized edema: Secondary | ICD-10-CM | POA: Diagnosis not present

## 2023-11-25 DIAGNOSIS — M5416 Radiculopathy, lumbar region: Secondary | ICD-10-CM | POA: Diagnosis not present

## 2023-11-27 DIAGNOSIS — M5416 Radiculopathy, lumbar region: Secondary | ICD-10-CM | POA: Diagnosis not present

## 2023-11-27 DIAGNOSIS — R200 Anesthesia of skin: Secondary | ICD-10-CM | POA: Diagnosis not present

## 2023-11-27 DIAGNOSIS — M48061 Spinal stenosis, lumbar region without neurogenic claudication: Secondary | ICD-10-CM | POA: Diagnosis not present

## 2023-11-27 DIAGNOSIS — M4316 Spondylolisthesis, lumbar region: Secondary | ICD-10-CM | POA: Diagnosis not present

## 2023-11-27 DIAGNOSIS — R202 Paresthesia of skin: Secondary | ICD-10-CM | POA: Diagnosis not present

## 2023-11-28 ENCOUNTER — Inpatient Hospital Stay
Admission: EM | Admit: 2023-11-28 | Discharge: 2023-12-15 | DRG: 291 | Disposition: A | Discharge status: Skilled Nursing Facility | Attending: Obstetrics and Gynecology | Admitting: Obstetrics and Gynecology

## 2023-11-28 ENCOUNTER — Other Ambulatory Visit: Payer: Self-pay

## 2023-11-28 ENCOUNTER — Emergency Department

## 2023-11-28 DIAGNOSIS — R059 Cough, unspecified: Secondary | ICD-10-CM | POA: Diagnosis not present

## 2023-11-28 DIAGNOSIS — T80211A Bloodstream infection due to central venous catheter, initial encounter: Secondary | ICD-10-CM | POA: Diagnosis not present

## 2023-11-28 DIAGNOSIS — G8929 Other chronic pain: Secondary | ICD-10-CM | POA: Diagnosis present

## 2023-11-28 DIAGNOSIS — F17210 Nicotine dependence, cigarettes, uncomplicated: Secondary | ICD-10-CM | POA: Diagnosis not present

## 2023-11-28 DIAGNOSIS — R7881 Bacteremia: Secondary | ICD-10-CM | POA: Diagnosis not present

## 2023-11-28 DIAGNOSIS — Z8673 Personal history of transient ischemic attack (TIA), and cerebral infarction without residual deficits: Secondary | ICD-10-CM

## 2023-11-28 DIAGNOSIS — R079 Chest pain, unspecified: Secondary | ICD-10-CM

## 2023-11-28 DIAGNOSIS — I480 Paroxysmal atrial fibrillation: Secondary | ICD-10-CM | POA: Diagnosis not present

## 2023-11-28 DIAGNOSIS — I130 Hypertensive heart and chronic kidney disease with heart failure and stage 1 through stage 4 chronic kidney disease, or unspecified chronic kidney disease: Secondary | ICD-10-CM | POA: Diagnosis not present

## 2023-11-28 DIAGNOSIS — J449 Chronic obstructive pulmonary disease, unspecified: Secondary | ICD-10-CM | POA: Diagnosis not present

## 2023-11-28 DIAGNOSIS — I161 Hypertensive emergency: Secondary | ICD-10-CM | POA: Diagnosis present

## 2023-11-28 DIAGNOSIS — Z4901 Encounter for fitting and adjustment of extracorporeal dialysis catheter: Secondary | ICD-10-CM

## 2023-11-28 DIAGNOSIS — E222 Syndrome of inappropriate secretion of antidiuretic hormone: Secondary | ICD-10-CM | POA: Diagnosis not present

## 2023-11-28 DIAGNOSIS — I10 Essential (primary) hypertension: Secondary | ICD-10-CM | POA: Diagnosis not present

## 2023-11-28 DIAGNOSIS — E11649 Type 2 diabetes mellitus with hypoglycemia without coma: Secondary | ICD-10-CM | POA: Diagnosis not present

## 2023-11-28 DIAGNOSIS — I5033 Acute on chronic diastolic (congestive) heart failure: Secondary | ICD-10-CM | POA: Diagnosis present

## 2023-11-28 DIAGNOSIS — N186 End stage renal disease: Secondary | ICD-10-CM | POA: Diagnosis not present

## 2023-11-28 DIAGNOSIS — A4102 Sepsis due to Methicillin resistant Staphylococcus aureus: Secondary | ICD-10-CM | POA: Diagnosis present

## 2023-11-28 DIAGNOSIS — G4700 Insomnia, unspecified: Secondary | ICD-10-CM | POA: Diagnosis not present

## 2023-11-28 DIAGNOSIS — E871 Hypo-osmolality and hyponatremia: Secondary | ICD-10-CM | POA: Diagnosis not present

## 2023-11-28 DIAGNOSIS — D631 Anemia in chronic kidney disease: Secondary | ICD-10-CM | POA: Diagnosis not present

## 2023-11-28 DIAGNOSIS — E1169 Type 2 diabetes mellitus with other specified complication: Secondary | ICD-10-CM | POA: Diagnosis not present

## 2023-11-28 DIAGNOSIS — I5031 Acute diastolic (congestive) heart failure: Secondary | ICD-10-CM | POA: Diagnosis not present

## 2023-11-28 DIAGNOSIS — E1122 Type 2 diabetes mellitus with diabetic chronic kidney disease: Secondary | ICD-10-CM | POA: Diagnosis present

## 2023-11-28 DIAGNOSIS — R0600 Dyspnea, unspecified: Secondary | ICD-10-CM

## 2023-11-28 DIAGNOSIS — Z743 Need for continuous supervision: Secondary | ICD-10-CM | POA: Diagnosis not present

## 2023-11-28 DIAGNOSIS — R197 Diarrhea, unspecified: Secondary | ICD-10-CM | POA: Diagnosis present

## 2023-11-28 DIAGNOSIS — N179 Acute kidney failure, unspecified: Secondary | ICD-10-CM | POA: Diagnosis not present

## 2023-11-28 DIAGNOSIS — E785 Hyperlipidemia, unspecified: Secondary | ICD-10-CM | POA: Diagnosis present

## 2023-11-28 DIAGNOSIS — G4733 Obstructive sleep apnea (adult) (pediatric): Secondary | ICD-10-CM | POA: Diagnosis not present

## 2023-11-28 DIAGNOSIS — N184 Chronic kidney disease, stage 4 (severe): Secondary | ICD-10-CM | POA: Diagnosis not present

## 2023-11-28 DIAGNOSIS — Y831 Surgical operation with implant of artificial internal device as the cause of abnormal reaction of the patient, or of later complication, without mention of misadventure at the time of the procedure: Secondary | ICD-10-CM | POA: Diagnosis not present

## 2023-11-28 DIAGNOSIS — E872 Acidosis, unspecified: Secondary | ICD-10-CM | POA: Diagnosis not present

## 2023-11-28 DIAGNOSIS — J90 Pleural effusion, not elsewhere classified: Secondary | ICD-10-CM | POA: Diagnosis not present

## 2023-11-28 DIAGNOSIS — Z91041 Radiographic dye allergy status: Secondary | ICD-10-CM

## 2023-11-28 DIAGNOSIS — I2489 Other forms of acute ischemic heart disease: Secondary | ICD-10-CM | POA: Diagnosis present

## 2023-11-28 DIAGNOSIS — I129 Hypertensive chronic kidney disease with stage 1 through stage 4 chronic kidney disease, or unspecified chronic kidney disease: Secondary | ICD-10-CM | POA: Diagnosis not present

## 2023-11-28 DIAGNOSIS — I160 Hypertensive urgency: Secondary | ICD-10-CM | POA: Diagnosis present

## 2023-11-28 DIAGNOSIS — Z66 Do not resuscitate: Secondary | ICD-10-CM | POA: Diagnosis not present

## 2023-11-28 DIAGNOSIS — R0602 Shortness of breath: Secondary | ICD-10-CM | POA: Diagnosis not present

## 2023-11-28 DIAGNOSIS — J440 Chronic obstructive pulmonary disease with acute lower respiratory infection: Secondary | ICD-10-CM | POA: Diagnosis not present

## 2023-11-28 DIAGNOSIS — E875 Hyperkalemia: Secondary | ICD-10-CM | POA: Diagnosis not present

## 2023-11-28 DIAGNOSIS — R0689 Other abnormalities of breathing: Secondary | ICD-10-CM | POA: Diagnosis not present

## 2023-11-28 DIAGNOSIS — Z794 Long term (current) use of insulin: Secondary | ICD-10-CM

## 2023-11-28 DIAGNOSIS — I2510 Atherosclerotic heart disease of native coronary artery without angina pectoris: Secondary | ICD-10-CM | POA: Diagnosis present

## 2023-11-28 DIAGNOSIS — Z1152 Encounter for screening for COVID-19: Secondary | ICD-10-CM | POA: Diagnosis not present

## 2023-11-28 DIAGNOSIS — J441 Chronic obstructive pulmonary disease with (acute) exacerbation: Principal | ICD-10-CM | POA: Diagnosis present

## 2023-11-28 DIAGNOSIS — Z741 Need for assistance with personal care: Secondary | ICD-10-CM | POA: Diagnosis not present

## 2023-11-28 DIAGNOSIS — M6259 Muscle wasting and atrophy, not elsewhere classified, multiple sites: Secondary | ICD-10-CM | POA: Diagnosis not present

## 2023-11-28 DIAGNOSIS — Z7982 Long term (current) use of aspirin: Secondary | ICD-10-CM

## 2023-11-28 DIAGNOSIS — R069 Unspecified abnormalities of breathing: Secondary | ICD-10-CM | POA: Diagnosis not present

## 2023-11-28 DIAGNOSIS — R918 Other nonspecific abnormal finding of lung field: Secondary | ICD-10-CM | POA: Diagnosis not present

## 2023-11-28 DIAGNOSIS — I517 Cardiomegaly: Secondary | ICD-10-CM | POA: Diagnosis not present

## 2023-11-28 DIAGNOSIS — G459 Transient cerebral ischemic attack, unspecified: Secondary | ICD-10-CM | POA: Diagnosis not present

## 2023-11-28 DIAGNOSIS — Z79899 Other long term (current) drug therapy: Secondary | ICD-10-CM

## 2023-11-28 DIAGNOSIS — I509 Heart failure, unspecified: Secondary | ICD-10-CM | POA: Diagnosis present

## 2023-11-28 DIAGNOSIS — G4730 Sleep apnea, unspecified: Secondary | ICD-10-CM | POA: Diagnosis not present

## 2023-11-28 DIAGNOSIS — F419 Anxiety disorder, unspecified: Secondary | ICD-10-CM | POA: Diagnosis present

## 2023-11-28 DIAGNOSIS — R1314 Dysphagia, pharyngoesophageal phase: Secondary | ICD-10-CM | POA: Diagnosis present

## 2023-11-28 DIAGNOSIS — N2581 Secondary hyperparathyroidism of renal origin: Secondary | ICD-10-CM | POA: Diagnosis not present

## 2023-11-28 DIAGNOSIS — E569 Vitamin deficiency, unspecified: Secondary | ICD-10-CM | POA: Diagnosis not present

## 2023-11-28 DIAGNOSIS — R509 Fever, unspecified: Secondary | ICD-10-CM | POA: Diagnosis not present

## 2023-11-28 DIAGNOSIS — M62838 Other muscle spasm: Secondary | ICD-10-CM | POA: Diagnosis not present

## 2023-11-28 DIAGNOSIS — B9562 Methicillin resistant Staphylococcus aureus infection as the cause of diseases classified elsewhere: Secondary | ICD-10-CM | POA: Diagnosis not present

## 2023-11-28 DIAGNOSIS — A4902 Methicillin resistant Staphylococcus aureus infection, unspecified site: Secondary | ICD-10-CM | POA: Diagnosis not present

## 2023-11-28 DIAGNOSIS — R0989 Other specified symptoms and signs involving the circulatory and respiratory systems: Secondary | ICD-10-CM | POA: Diagnosis not present

## 2023-11-28 DIAGNOSIS — I120 Hypertensive chronic kidney disease with stage 5 chronic kidney disease or end stage renal disease: Secondary | ICD-10-CM | POA: Diagnosis not present

## 2023-11-28 DIAGNOSIS — K5900 Constipation, unspecified: Secondary | ICD-10-CM | POA: Diagnosis not present

## 2023-11-28 DIAGNOSIS — Z992 Dependence on renal dialysis: Secondary | ICD-10-CM | POA: Diagnosis not present

## 2023-11-28 DIAGNOSIS — R040 Epistaxis: Secondary | ICD-10-CM | POA: Diagnosis not present

## 2023-11-28 DIAGNOSIS — M7989 Other specified soft tissue disorders: Secondary | ICD-10-CM | POA: Diagnosis not present

## 2023-11-28 DIAGNOSIS — I959 Hypotension, unspecified: Secondary | ICD-10-CM | POA: Diagnosis not present

## 2023-11-28 LAB — CBC WITH DIFFERENTIAL/PLATELET
Abs Immature Granulocytes: 0.07 10*3/uL (ref 0.00–0.07)
Basophils Absolute: 0 10*3/uL (ref 0.0–0.1)
Basophils Relative: 0 %
Eosinophils Absolute: 0 10*3/uL (ref 0.0–0.5)
Eosinophils Relative: 0 %
HCT: 27.4 % — ABNORMAL LOW (ref 36.0–46.0)
Hemoglobin: 9 g/dL — ABNORMAL LOW (ref 12.0–15.0)
Immature Granulocytes: 1 %
Lymphocytes Relative: 5 %
Lymphs Abs: 0.6 10*3/uL — ABNORMAL LOW (ref 0.7–4.0)
MCH: 30.3 pg (ref 26.0–34.0)
MCHC: 32.8 g/dL (ref 30.0–36.0)
MCV: 92.3 fL (ref 80.0–100.0)
Monocytes Absolute: 0.7 10*3/uL (ref 0.1–1.0)
Monocytes Relative: 6 %
Neutro Abs: 9.7 10*3/uL — ABNORMAL HIGH (ref 1.7–7.7)
Neutrophils Relative %: 88 %
Platelets: 292 10*3/uL (ref 150–400)
RBC: 2.97 MIL/uL — ABNORMAL LOW (ref 3.87–5.11)
RDW: 14.1 % (ref 11.5–15.5)
WBC: 11.1 10*3/uL — ABNORMAL HIGH (ref 4.0–10.5)
nRBC: 0 % (ref 0.0–0.2)

## 2023-11-28 LAB — HEPATIC FUNCTION PANEL
ALT: 11 U/L (ref 0–44)
AST: 22 U/L (ref 15–41)
Albumin: 2.6 g/dL — ABNORMAL LOW (ref 3.5–5.0)
Alkaline Phosphatase: 88 U/L (ref 38–126)
Bilirubin, Direct: <0.1 mg/dL (ref 0.0–0.2)
Total Bilirubin: 0.7 mg/dL (ref 0.0–1.2)
Total Protein: 6.2 g/dL — ABNORMAL LOW (ref 6.5–8.1)

## 2023-11-28 LAB — BASIC METABOLIC PANEL WITH GFR
Anion gap: 11 (ref 5–15)
BUN: 66 mg/dL — ABNORMAL HIGH (ref 8–23)
CO2: 18 mmol/L — ABNORMAL LOW (ref 22–32)
Calcium: 8.5 mg/dL — ABNORMAL LOW (ref 8.9–10.3)
Chloride: 93 mmol/L — ABNORMAL LOW (ref 98–111)
Creatinine, Ser: 3.77 mg/dL — ABNORMAL HIGH (ref 0.44–1.00)
GFR, Estimated: 12 mL/min — ABNORMAL LOW (ref >60)
Glucose, Bld: 324 mg/dL — ABNORMAL HIGH (ref 70–99)
Potassium: 5 mmol/L (ref 3.5–5.1)
Sodium: 122 mmol/L — ABNORMAL LOW (ref 135–145)

## 2023-11-28 LAB — GLUCOSE, CAPILLARY
Glucose-Capillary: 235 mg/dL — ABNORMAL HIGH (ref 70–99)
Glucose-Capillary: 305 mg/dL — ABNORMAL HIGH (ref 70–99)
Glucose-Capillary: 327 mg/dL — ABNORMAL HIGH (ref 70–99)
Glucose-Capillary: 397 mg/dL — ABNORMAL HIGH (ref 70–99)

## 2023-11-28 LAB — COMPREHENSIVE METABOLIC PANEL WITH GFR
ALT: 11 U/L (ref 0–44)
AST: 17 U/L (ref 15–41)
Albumin: 2.9 g/dL — ABNORMAL LOW (ref 3.5–5.0)
Alkaline Phosphatase: 95 U/L (ref 38–126)
Anion gap: 10 (ref 5–15)
BUN: 67 mg/dL — ABNORMAL HIGH (ref 8–23)
CO2: 19 mmol/L — ABNORMAL LOW (ref 22–32)
Calcium: 8.8 mg/dL — ABNORMAL LOW (ref 8.9–10.3)
Chloride: 94 mmol/L — ABNORMAL LOW (ref 98–111)
Creatinine, Ser: 3.59 mg/dL — ABNORMAL HIGH (ref 0.44–1.00)
GFR, Estimated: 13 mL/min — ABNORMAL LOW (ref >60)
Glucose, Bld: 207 mg/dL — ABNORMAL HIGH (ref 70–99)
Potassium: 5.2 mmol/L — ABNORMAL HIGH (ref 3.5–5.1)
Sodium: 123 mmol/L — ABNORMAL LOW (ref 135–145)
Total Bilirubin: 0.7 mg/dL (ref 0.0–1.2)
Total Protein: 6.7 g/dL (ref 6.5–8.1)

## 2023-11-28 LAB — TROPONIN I (HIGH SENSITIVITY)
Troponin I (High Sensitivity): 19 ng/L — ABNORMAL HIGH (ref <18)
Troponin I (High Sensitivity): 20 ng/L — ABNORMAL HIGH (ref <18)

## 2023-11-28 LAB — POTASSIUM: Potassium: 5.1 mmol/L (ref 3.5–5.1)

## 2023-11-28 LAB — SODIUM: Sodium: 121 mmol/L — ABNORMAL LOW (ref 135–145)

## 2023-11-28 LAB — MAGNESIUM: Magnesium: 2.2 mg/dL (ref 1.7–2.4)

## 2023-11-28 LAB — BRAIN NATRIURETIC PEPTIDE: B Natriuretic Peptide: 2450.2 pg/mL — ABNORMAL HIGH (ref 0.0–100.0)

## 2023-11-28 MED ORDER — INSULIN GLARGINE-YFGN 100 UNIT/ML ~~LOC~~ SOLN
10.0000 [IU] | Freq: Two times a day (BID) | SUBCUTANEOUS | Status: DC
  Administered 2023-11-30 – 2023-12-01 (×3): 10 [IU] via SUBCUTANEOUS
  Filled 2023-11-28 (×9): qty 0.1

## 2023-11-28 MED ORDER — DIAZEPAM 5 MG PO TABS
5.0000 mg | ORAL_TABLET | Freq: Two times a day (BID) | ORAL | Status: DC | PRN
  Filled 2023-11-28: qty 1

## 2023-11-28 MED ORDER — ONDANSETRON HCL 4 MG PO TABS
4.0000 mg | ORAL_TABLET | Freq: Four times a day (QID) | ORAL | Status: DC | PRN
Start: 2023-11-28 — End: 2023-12-15
  Administered 2023-11-30 – 2023-12-05 (×2): 4 mg via ORAL
  Filled 2023-11-28 (×3): qty 1

## 2023-11-28 MED ORDER — INSULIN GLARGINE-YFGN 100 UNIT/ML ~~LOC~~ SOLN
10.0000 [IU] | Freq: Two times a day (BID) | SUBCUTANEOUS | Status: DC
  Filled 2023-11-28 (×2): qty 0.1

## 2023-11-28 MED ORDER — METOPROLOL SUCCINATE ER 100 MG PO TB24
100.0000 mg | ORAL_TABLET | Freq: Every day | ORAL | Status: DC
  Administered 2023-11-30 – 2023-12-14 (×15): 100 mg via ORAL
  Filled 2023-11-28 (×17): qty 1

## 2023-11-28 MED ORDER — HYDRALAZINE HCL 20 MG/ML IJ SOLN
20.0000 mg | Freq: Four times a day (QID) | INTRAMUSCULAR | Status: DC | PRN
  Administered 2023-11-30: 20 mg via INTRAVENOUS
  Filled 2023-11-28: qty 1

## 2023-11-28 MED ORDER — ROSUVASTATIN CALCIUM 5 MG PO TABS
5.0000 mg | ORAL_TABLET | Freq: Every day | ORAL | Status: DC
  Administered 2023-11-30 – 2023-12-05 (×4): 5 mg via ORAL
  Filled 2023-11-28 (×9): qty 1

## 2023-11-28 MED ORDER — IPRATROPIUM-ALBUTEROL 0.5-2.5 (3) MG/3ML IN SOLN
6.0000 mL | Freq: Once | RESPIRATORY_TRACT | Status: AC
  Filled 2023-11-28: qty 6

## 2023-11-28 MED ORDER — NICOTINE 21 MG/24HR TD PT24
21.0000 mg | MEDICATED_PATCH | Freq: Every day | TRANSDERMAL | Status: DC
  Administered 2023-11-30 – 2023-12-03 (×4): 21 mg via TRANSDERMAL
  Filled 2023-11-28 (×8): qty 1

## 2023-11-28 MED ORDER — INSULIN ASPART 100 UNIT/ML IJ SOLN
0.0000 [IU] | Freq: Every day | INTRAMUSCULAR | Status: DC
  Administered 2023-11-30 – 2023-12-02 (×3): 5 [IU] via SUBCUTANEOUS
  Administered 2023-12-06: 2 [IU] via SUBCUTANEOUS
  Administered 2023-12-08: 3 [IU] via SUBCUTANEOUS
  Administered 2023-12-10 – 2023-12-12 (×2): 2 [IU] via SUBCUTANEOUS
  Filled 2023-11-28 (×9): qty 1

## 2023-11-28 MED ORDER — ONDANSETRON HCL 4 MG/2ML IJ SOLN
4.0000 mg | Freq: Once | INTRAMUSCULAR | Status: AC
  Filled 2023-11-28: qty 2

## 2023-11-28 MED ORDER — ASPIRIN 81 MG PO TBEC
81.0000 mg | DELAYED_RELEASE_TABLET | Freq: Every day | ORAL | Status: DC
  Administered 2023-11-30 – 2023-12-14 (×15): 81 mg via ORAL
  Filled 2023-11-28 (×17): qty 1

## 2023-11-28 MED ORDER — GUAIFENESIN-DM 100-10 MG/5ML PO SYRP
5.0000 mL | ORAL_SOLUTION | ORAL | Status: DC | PRN
  Filled 2023-11-28: qty 10

## 2023-11-28 MED ORDER — METHYLPREDNISOLONE SODIUM SUCC 40 MG IJ SOLR
40.0000 mg | INTRAMUSCULAR | Status: AC
  Filled 2023-11-28: qty 1

## 2023-11-28 MED ORDER — ACETAMINOPHEN 325 MG PO TABS
650.0000 mg | ORAL_TABLET | Freq: Four times a day (QID) | ORAL | Status: DC | PRN
Start: 2023-11-28 — End: 2023-12-15
  Administered 2023-11-30 – 2023-12-11 (×9): 650 mg via ORAL
  Filled 2023-11-28 (×12): qty 2

## 2023-11-28 MED ORDER — GUAIFENESIN ER 600 MG PO TB12
600.0000 mg | ORAL_TABLET | Freq: Two times a day (BID) | ORAL | Status: DC
  Administered 2023-11-29 – 2023-12-05 (×6): 600 mg via ORAL
  Filled 2023-11-28 (×12): qty 1

## 2023-11-28 MED ORDER — PREDNISONE 20 MG PO TABS
40.0000 mg | ORAL_TABLET | Freq: Every day | ORAL | Status: AC
  Administered 2023-11-30 – 2023-12-02 (×3): 40 mg via ORAL
  Filled 2023-11-28 (×4): qty 2

## 2023-11-28 MED ORDER — LOSARTAN POTASSIUM 50 MG PO TABS
100.0000 mg | ORAL_TABLET | Freq: Every day | ORAL | Status: DC
  Administered 2023-11-30 – 2023-12-14 (×14): 100 mg via ORAL
  Filled 2023-11-28 (×17): qty 2

## 2023-11-28 MED ORDER — AMLODIPINE BESYLATE 10 MG PO TABS
10.0000 mg | ORAL_TABLET | Freq: Every day | ORAL | Status: DC
  Administered 2023-11-30 – 2023-12-07 (×7): 10 mg via ORAL
  Filled 2023-11-28 (×9): qty 1

## 2023-11-28 MED ORDER — FUROSEMIDE 10 MG/ML IJ SOLN: 20.0000 mg | Freq: Two times a day (BID) | INTRAMUSCULAR | Status: DC

## 2023-11-28 MED ORDER — ALBUTEROL SULFATE (2.5 MG/3ML) 0.083% IN NEBU
2.5000 mg | INHALATION_SOLUTION | RESPIRATORY_TRACT | Status: DC | PRN
Start: 2023-11-28 — End: 2023-12-15
  Administered 2023-11-30 – 2023-12-14 (×28): 2.5 mg via RESPIRATORY_TRACT
  Filled 2023-11-28 (×34): qty 3

## 2023-11-28 MED ORDER — SODIUM BICARBONATE 650 MG PO TABS
650.0000 mg | ORAL_TABLET | Freq: Two times a day (BID) | ORAL | Status: DC
  Administered 2023-11-30 – 2023-12-03 (×7): 650 mg via ORAL
  Filled 2023-11-28 (×11): qty 1

## 2023-11-28 MED ORDER — SUCRALFATE 1 G PO TABS: 1.0000 g | ORAL_TABLET | Freq: Two times a day (BID) | ORAL | Status: DC | PRN

## 2023-11-28 MED ORDER — TOLVAPTAN 15 MG PO TABS
15.0000 mg | ORAL_TABLET | Freq: Once | ORAL | Status: AC
  Filled 2023-11-28: qty 1

## 2023-11-28 MED ORDER — FUROSEMIDE 10 MG/ML IJ SOLN
40.0000 mg | Freq: Once | INTRAMUSCULAR | Status: AC
  Filled 2023-11-28: qty 4

## 2023-11-28 MED ORDER — DIAZEPAM 5 MG PO TABS
5.0000 mg | ORAL_TABLET | Freq: Every day | ORAL | Status: DC
  Administered 2023-11-30 – 2023-12-01 (×2): 5 mg via ORAL
  Filled 2023-11-28 (×3): qty 1

## 2023-11-28 MED ORDER — ENOXAPARIN SODIUM 30 MG/0.3ML IJ SOSY
30.0000 mg | PREFILLED_SYRINGE | INTRAMUSCULAR | Status: DC
  Administered 2023-11-30 – 2023-12-02 (×2): 30 mg via SUBCUTANEOUS
  Filled 2023-11-28 (×4): qty 0.3

## 2023-11-28 MED ORDER — INSULIN ASPART 100 UNIT/ML IJ SOLN
0.0000 [IU] | Freq: Three times a day (TID) | INTRAMUSCULAR | Status: DC
  Administered 2023-11-30: 5 [IU] via SUBCUTANEOUS
  Administered 2023-11-30: 3 [IU] via SUBCUTANEOUS
  Administered 2023-11-30: 2 [IU] via SUBCUTANEOUS
  Administered 2023-12-01 – 2023-12-03 (×4): 3 [IU] via SUBCUTANEOUS
  Administered 2023-12-05: 5 [IU] via SUBCUTANEOUS
  Administered 2023-12-05: 2 [IU] via SUBCUTANEOUS
  Administered 2023-12-06: 5 [IU] via SUBCUTANEOUS
  Administered 2023-12-07: 3 [IU] via SUBCUTANEOUS
  Administered 2023-12-07 (×2): 7 [IU] via SUBCUTANEOUS
  Administered 2023-12-08: 2 [IU] via SUBCUTANEOUS
  Administered 2023-12-09: 5 [IU] via SUBCUTANEOUS
  Administered 2023-12-09 – 2023-12-10 (×3): 3 [IU] via SUBCUTANEOUS
  Administered 2023-12-11 (×2): 2 [IU] via SUBCUTANEOUS
  Administered 2023-12-12 (×2): 1 [IU] via SUBCUTANEOUS
  Administered 2023-12-12: 3 [IU] via SUBCUTANEOUS
  Administered 2023-12-13: 1 [IU] via SUBCUTANEOUS
  Administered 2023-12-13 – 2023-12-14 (×2): 3 [IU] via SUBCUTANEOUS
  Administered 2023-12-14: 2 [IU] via SUBCUTANEOUS
  Administered 2023-12-14: 1 [IU] via SUBCUTANEOUS
  Administered 2023-12-15: 2 [IU] via SUBCUTANEOUS
  Filled 2023-11-28 (×32): qty 1

## 2023-11-28 MED ORDER — SODIUM ZIRCONIUM CYCLOSILICATE 10 G PO PACK
10.0000 g | PACK | Freq: Once | ORAL | Status: AC
  Filled 2023-11-28 (×2): qty 1

## 2023-11-28 MED ORDER — HYDRALAZINE HCL 20 MG/ML IJ SOLN
5.0000 mg | INTRAMUSCULAR | Status: AC | PRN
  Filled 2023-11-28: qty 1

## 2023-11-28 MED ORDER — ONDANSETRON HCL 4 MG/2ML IJ SOLN
4.0000 mg | Freq: Four times a day (QID) | INTRAMUSCULAR | Status: DC | PRN
  Administered 2023-12-01 – 2023-12-03 (×3): 4 mg via INTRAVENOUS
  Filled 2023-11-28 (×6): qty 2

## 2023-11-28 MED ORDER — IPRATROPIUM-ALBUTEROL 0.5-2.5 (3) MG/3ML IN SOLN
3.0000 mL | Freq: Four times a day (QID) | RESPIRATORY_TRACT | Status: DC
  Filled 2023-11-28 (×4): qty 3

## 2023-11-28 MED ORDER — METHYLPREDNISOLONE SODIUM SUCC 125 MG IJ SOLR
125.0000 mg | Freq: Once | INTRAMUSCULAR | Status: AC
  Filled 2023-11-28: qty 2

## 2023-11-28 MED ORDER — ACETAMINOPHEN 650 MG RE SUPP: 650.0000 mg | Freq: Four times a day (QID) | RECTAL | Status: DC | PRN

## 2023-11-28 NOTE — Assessment & Plan Note (Signed)
 Monitor renal function and avoid nephrotoxins Monitor for worsening with IV Lasix

## 2023-11-28 NOTE — ED Provider Notes (Signed)
 [facility] Provider Note    Event Date/Time   First MD Initiated Contact with Patient 11/28/23 0225     (approximate)   History   Chest Pain and Shortness of Breath   HPI  Mckenzie Lawrence is a 78 y.o. female who presents to the ED for evaluation of Chest Pain and Shortness of Breath   Review an annual PCP visit from 5/21.  History of COPD, CAD, DM, CKD  Patient presents for evaluation of worsening shortness of breath, nonproductive cough and chest pressure over the past couple days. L>R lower extremity swelling.    Physical Exam   Triage Vital Signs: ED Triage Vitals [11/28/23 0226]  Encounter Vitals Group     BP      Girls Systolic BP Percentile      Girls Diastolic BP Percentile      Boys Systolic BP Percentile      Boys Diastolic BP Percentile      Pulse      Resp      Temp      Temp src      SpO2      Weight      Height      Head Circumference      Peak Flow      Pain Score 7     Pain Loc      Pain Education      Exclude from Growth Chart     Most recent vital signs: Vitals:   11/28/23 0500 11/28/23 0509  BP: (!) 193/91   Pulse: 79   Resp: (!) 22   Temp:  98.3 F (36.8 C)  SpO2: 93%     General: Awake, no distress.  CV:  Good peripheral perfusion.  Resp:  Tachypnea and wheezing, improving on reassessment Abd:  No distention.  Soft MSK:  No deformity noted.  Pitting edema to bilateral lower extremities, left greater than right Neuro:  No focal deficits appreciated. Other:     ED Results / Procedures / Treatments   Labs (all labs ordered are listed, but only abnormal results are displayed) Labs Reviewed  COMPREHENSIVE METABOLIC PANEL WITH GFR - Abnormal; Notable for the following components:      Result Value   Sodium 123 (*)    Potassium 5.2 (*)    Chloride 94 (*)    CO2 19 (*)    Glucose, Bld 207 (*)    BUN 67 (*)    Creatinine, Ser 3.59 (*)    Calcium  8.8 (*)    Albumin 2.9 (*)    GFR, Estimated 13 (*)     All other components within normal limits  CBC WITH DIFFERENTIAL/PLATELET - Abnormal; Notable for the following components:   WBC 11.1 (*)    RBC 2.97 (*)    Hemoglobin 9.0 (*)    HCT 27.4 (*)    Neutro Abs 9.7 (*)    Lymphs Abs 0.6 (*)    All other components within normal limits  BRAIN NATRIURETIC PEPTIDE - Abnormal; Notable for the following components:   B Natriuretic Peptide 2,450.2 (*)    All other components within normal limits  TROPONIN I (HIGH SENSITIVITY) - Abnormal; Notable for the following components:   Troponin I (High Sensitivity) 19 (*)    All other components within normal limits  MAGNESIUM   TROPONIN I (HIGH SENSITIVITY)    EKG Sinus rhythm with a rate of 79 bpm.  Normal axis and intervals without clear signs  of acute ischemia.  RADIOLOGY 1 view CXR interpreted by me with pulmonary vascular congestion and small right pleural effusion Ultrasound interpreted by me bilateral lower extremities without signs of DVT  Official radiology report(s): US  Venous Img Lower Bilateral Result Date: 11/28/2023 CLINICAL DATA:  New onset left greater than right lower extremity swelling, without findings of CHF. EXAM: BILATERAL LOWER EXTREMITY VENOUS DOPPLER ULTRASOUND TECHNIQUE: Gray-scale sonography with graded compression, as well as color Doppler and duplex ultrasound were performed to evaluate the lower extremity deep venous systems from the level of the common femoral vein and including the common femoral, femoral, profunda femoral, popliteal and calf veins including the posterior tibial, peroneal and gastrocnemius veins when visible. The superficial great saphenous vein was also interrogated. Spectral Doppler was utilized to evaluate flow at rest and with distal augmentation maneuvers in the common femoral, femoral and popliteal veins. COMPARISON:  Left lower extremity DVT exam 08/26/2019. FINDINGS: RIGHT LOWER EXTREMITY Common Femoral Vein: No evidence of thrombus. Normal  compressibility, respiratory phasicity and response to augmentation. Saphenofemoral Junction: No evidence of thrombus. Normal compressibility and flow on color Doppler imaging. Profunda Femoral Vein: No evidence of thrombus. Normal compressibility and flow on color Doppler imaging. Femoral Vein: No evidence of thrombus. Normal compressibility, respiratory phasicity and response to augmentation. Popliteal Vein: No evidence of thrombus. Normal compressibility, respiratory phasicity and response to augmentation. Calf Veins: No evidence of thrombus. Normal compressibility and flow on color Doppler imaging. Superficial Great Saphenous Vein: No evidence of thrombus. Normal compressibility. Venous Reflux:  None. Other Findings:  Pulsatile venous waveforms. LEFT LOWER EXTREMITY Common Femoral Vein: No evidence of thrombus. Normal compressibility, respiratory phasicity and response to augmentation. Saphenofemoral Junction: No evidence of thrombus. Normal compressibility and flow on color Doppler imaging. Profunda Femoral Vein: No evidence of thrombus. Normal compressibility and flow on color Doppler imaging. Femoral Vein: No evidence of thrombus. Normal compressibility, respiratory phasicity and response to augmentation. Popliteal Vein: No evidence of thrombus. Normal compressibility, respiratory phasicity and response to augmentation. Calf Veins: No evidence of thrombus. Normal compressibility and flow on color Doppler imaging. Superficial Great Saphenous Vein: No evidence of thrombus. Normal compressibility. Venous Reflux:  None. Other Findings:  Pulsatile venous waveforms. IMPRESSION: 1. No evidence of deep venous thrombosis in either lower extremity. 2. Pulsatile venous waveforms, which can be seen with right heart failure or tricuspid regurgitation. Electronically Signed   By: Denman Fischer M.D.   On: 11/28/2023 04:56   DG Chest Portable 1 View Result Date: 11/28/2023 CLINICAL DATA:  Difficulty breathing EXAM:  PORTABLE CHEST 1 VIEW COMPARISON:  07/19/2023 FINDINGS: Cardiac shadow is mildly enlarged. Aortic calcifications are seen. Mild vascular congestion is noted. Small right-sided pleural effusion is noted. Metallic densities are noted symmetrically over the upper chest likely artifactual in nature. IMPRESSION: Small right pleural effusion and vascular congestion. Electronically Signed   By: Violeta Grey M.D.   On: 11/28/2023 02:59    PROCEDURES and INTERVENTIONS:  .1-3 Lead EKG Interpretation  Performed by: Arline Bennett, MD Authorized by: Arline Bennett, MD     Interpretation: normal     ECG rate:  80   ECG rate assessment: normal     Rhythm: sinus rhythm     Ectopy: none     Conduction: normal     Medications  furosemide (LASIX) injection 40 mg (has no administration in time range)  aspirin  EC tablet 81 mg (has no administration in time range)  amLODipine  (NORVASC ) tablet 10 mg (has no administration in time  range)  metoprolol  succinate (TOPROL -XL) 24 hr tablet 100 mg (has no administration in time range)  rosuvastatin  (CRESTOR ) tablet 5 mg (has no administration in time range)  diazepam  (VALIUM ) tablet 5 mg (has no administration in time range)  insulin  glargine-yfgn (SEMGLEE ) injection 10 Units (has no administration in time range)  sodium bicarbonate  tablet 650 mg (has no administration in time range)  sucralfate  (CARAFATE ) tablet 1 g (has no administration in time range)  acetaminophen  (TYLENOL ) tablet 650 mg (has no administration in time range)    Or  acetaminophen  (TYLENOL ) suppository 650 mg (has no administration in time range)  ondansetron  (ZOFRAN ) tablet 4 mg (has no administration in time range)    Or  ondansetron  (ZOFRAN ) injection 4 mg (has no administration in time range)  methylPREDNISolone  sodium succinate (SOLU-MEDROL ) 40 mg/mL injection 40 mg (has no administration in time range)    Followed by  predniSONE  (DELTASONE ) tablet 40 mg (has no administration in time  range)  ipratropium-albuterol  (DUONEB) 0.5-2.5 (3) MG/3ML nebulizer solution 3 mL (has no administration in time range)  albuterol  (PROVENTIL ) (2.5 MG/3ML) 0.083% nebulizer solution 2.5 mg (has no administration in time range)  furosemide (LASIX) injection 20 mg (has no administration in time range)  enoxaparin  (LOVENOX ) injection 30 mg (has no administration in time range)  insulin  aspart (novoLOG ) injection 0-9 Units (has no administration in time range)  insulin  aspart (novoLOG ) injection 0-5 Units (has no administration in time range)  guaiFENesin  (MUCINEX ) 12 hr tablet 600 mg (has no administration in time range)  hydrALAZINE  (APRESOLINE ) injection 5 mg (has no administration in time range)  ipratropium-albuterol  (DUONEB) 0.5-2.5 (3) MG/3ML nebulizer solution 6 mL (6 mLs Nebulization Given 11/28/23 0257)  methylPREDNISolone  sodium succinate (SOLU-MEDROL ) 125 mg/2 mL injection 125 mg (125 mg Intravenous Given 11/28/23 0255)  ondansetron  (ZOFRAN ) injection 4 mg (4 mg Intravenous Given 11/28/23 0445)     IMPRESSION / MDM / ASSESSMENT AND PLAN / ED COURSE  I reviewed the triage vital signs and the nursing notes.  Differential diagnosis includes, but is not limited to, ACS, PTX, PNA, muscle strain/spasm, PE, dissection, anxiety, pleural effusion  {Patient presents with symptoms of an acute illness or injury that is potentially life-threatening.  Patient presents with evidence of COPD exacerbation, AKI and hyponatremia, possibly new onset CHF.  Not hypoxic but tachypneic and dyspneic improving with breathing treatments.  Steroids as well to help with COPD.  Elevated BNP and volume overload on x-ray and clinically suggestive of CHF.  She had a normal EF 4 months ago.  AKI on CKD, hyponatremia.  Troponins are minimally elevated.  Clinical Course as of 11/28/23 0511  Fri Nov 28, 2023  0344 Reassessed and discussed plan of care [DS]  [phone] I consult hospitalist agrees to admit [DS]  0504  Reassessed and discussed plan of care.  Daughter is at the bedside and answered her questions [DS]    Clinical Course User Index [DS] Arline Bennett, MD     FINAL CLINICAL IMPRESSION(S) / ED DIAGNOSES   Final diagnoses:  COPD exacerbation (HCC)  Hyponatremia  AKI (acute kidney injury) (HCC)     Rx / DC Orders   ED Discharge Orders     None        Note:  This document was prepared using Dragon voice recognition software and may include unintentional dictation errors.   Arline Bennett, MD 11/28/23 [phone]

## 2023-11-28 NOTE — ED Triage Notes (Signed)
 Patient arrives by [facility] with complaints of breathing difficulty and pressure in her chest.  EMS reports systolic BP over 829 with no previous history of hypertension.  Patient says it hurts to take a deep breath.

## 2023-11-28 NOTE — H&P (Signed)
 History and Physical    Patient: Mckenzie Lawrence ZOX:[id number] DOB: 1945/07/26 DOA: 11/28/2023 DOS: the patient was seen and examined on 11/28/2023 PCP: Lyle San, MD  Patient coming from: Home  Chief Complaint:  Chief Complaint  Patient presents with   Chest Pain   Shortness of Breath    HPI: Mckenzie Lawrence is a 78 y.o. female with medical history significant for COPD, CVA, stage 4 CKD, SIADH, esophageal dysphagia treated with Botox  (last in 05/2023), DM, HLD, and HTN, last hospitalized from 2/1 to 07/26/2023 with MRSA bacteremia/sepsis/multifocal pneumonia, now presenting by EMS with shortness of breath and chest pressure.  Daughter at bedside gives history and states that her mother was in her usual state of health until earlier in the day on 6/12 when she developed shortness of breath while getting an MRI on her back.  His symptoms seem to improve however on the morning of 6/13 she awoke at 2 AM gasping for breath and called EMS.  She states patient's blood pressure had been running high lately due to ongoing back pain and she had also complained of swelling in her legs.  She has no orthopnea and sleeps on 1 pillow.  She has a chronic congested cough that has not changed and has no fever or chills  BP on arrival 202/74 and tachypneic to 25 saturating at 98% on room air.  Afebrile and pulse in the 70s.  Labs notable for WBC 11,000 with hemoglobin of 9 which is her baseline. Troponin 19 and BNP 2450 Creatinine 3.59 up from baseline of 2.74 with bicarb 19.  Glucose 207, potassium 5.2, sodium 123 (137 four months ago) EKG with sinus rhythm at 79 Chest x-ray showing small right pleural effusion and vascular congestion Bilateral lower extremity venous ultrasound ordered-result pending. Patient treated with DuoNeb and Solu-Medrol  and given a dose of Lasix Hospitalist consulted for admission for COPD exacerbation and possible new onset CHF.     Review of Systems: As mentioned in the history  of present illness. All other systems reviewed and are negative.  Past Medical History:  Diagnosis Date   Anemia    Arthritis    Bladder incontinence    Broken foot    Cataracts, bilateral    Chronic kidney insufficiency    stage 3b   COPD (chronic obstructive pulmonary disease) (HCC)    wheezing   Coronary artery disease 04/25/2022   in [place]   COVID 2021   very mild case   CVA (cerebral vascular accident) (HCC) 2016   has had 3 strokes, states right side is slightlyweaker than left   Diabetes mellitus    insulin  dependent, Type 2   GERD (gastroesophageal reflux disease)    HLD (hyperlipidemia)    Hx of cardiovascular stress test    a. ETT (6/13):  Ex 5:13; no ischemic changes   Hypertension    controlled on meds   Lacunar stroke of left subthalamic region [facility]) 02/2015   Leg pain    left   Lower back pain    Neuromuscular disorder (HCC)    stroke right hand tingling   Orthostatic hypotension    Osteopenia 01/2017   T score -2.0 stable from prior DEXA   Pancreatitis 10/2021   PCOS (polycystic ovarian syndrome)    Personal history of tobacco use, presenting hazards to health 01/09/2015   PONV (postoperative nausea and vomiting)    Sleep apnea    uses CPAP nightly   Past Surgical History:  Procedure Laterality  Date   BIOPSY  08/28/2022   Procedure: BIOPSY;  Surgeon: Felecia Hopper, MD;  Location: [facility];  Service: Gastroenterology;;   BOTOX  INJECTION  01/15/2022   Procedure: BOTOX  INJECTION;  Surgeon: Ozell Blunt, MD;  Location: [facility];  Service: Gastroenterology;;   BOTOX  INJECTION N/A 08/28/2022   Procedure: BOTOX  INJECTION;  Surgeon: Felecia Hopper, MD;  Location: [facility];  Service: Gastroenterology;  Laterality: N/A;   BOTOX  INJECTION Bilateral 05/27/2023   Procedure: BOTOX  INJECTION;  Surgeon: Ozell Blunt, MD;  Location: [facility];  Service: Gastroenterology;  Laterality: Bilateral;   BROW LIFT Bilateral 11/04/2017   Procedure:  BLEPHAROPLASTY UPPER EYELID W/EXCESS SKIN;  Surgeon: Zacarias Hermann, MD;  Location: [facility];  Service: Ophthalmology;  Laterality: Bilateral;  DIABETES-insulin  dependent uses CPAP   CARDIAC CATHETERIZATION  20 yrs ago   found nothing   CARPAL TUNNEL RELEASE Bilateral    CATARACT EXTRACTION Bilateral    ELBOW SURGERY Bilateral    ESOPHAGOGASTRODUODENOSCOPY N/A 07/25/2021   Procedure: ESOPHAGOGASTRODUODENOSCOPY (EGD);  Surgeon: Toledo, Alphonsus Jeans, MD;  Location: [facility];  Service: Gastroenterology;  Laterality: N/A;  IDDM   ESOPHAGOGASTRODUODENOSCOPY N/A 01/15/2022   Procedure: ESOPHAGOGASTRODUODENOSCOPY (EGD);  Surgeon: Ozell Blunt, MD;  Location: [facility];  Service: Gastroenterology;  Laterality: N/A;  botox    ESOPHAGOGASTRODUODENOSCOPY (EGD) WITH PROPOFOL  N/A 08/28/2022   Procedure: ESOPHAGOGASTRODUODENOSCOPY (EGD) WITH PROPOFOL ;  Surgeon: Felecia Hopper, MD;  Location: [facility];  Service: Gastroenterology;  Laterality: N/A;   ESOPHAGOGASTRODUODENOSCOPY (EGD) WITH PROPOFOL  Bilateral 05/27/2023   Procedure: ESOPHAGOGASTRODUODENOSCOPY (EGD) WITH PROPOFOL ;  Surgeon: Ozell Blunt, MD;  Location: [facility];  Service: Gastroenterology;  Laterality: Bilateral;   FOOT SURGERY     Groin Abscess     HAND SURGERY     KNEE SURGERY Bilateral    LABIAL ABSCESS     LUMBAR LAMINECTOMY/DECOMPRESSION MICRODISCECTOMY Left 05/11/2018   Procedure: LUMBAR LAMINECTOMY/DECOMPRESSION MICRODISCECTOMY 1 LEVEL- L4-5;  Surgeon: Berta Brittle, MD;  Location: [facility];  Service: Neurosurgery;  Laterality: Left;   LUMBAR LAMINECTOMY/DECOMPRESSION MICRODISCECTOMY Left 05/20/2022   Procedure: MICRODISCECTOMY L3-4;  Surgeon: Garry Kansas, MD;  Location: [facility] OR;  Service: Neurosurgery;  Laterality: Left;  3C   LUMBAR WOUND DEBRIDEMENT N/A 08/08/2022   Procedure: INCISION AND DRAINAGE OF LUMBAR WOUND;  Surgeon: Garry Kansas, MD;  Location: [facility] OR;  Service: Neurosurgery;  Laterality: N/A;  3C    OOPHORECTOMY     BSO   PUBO VAG SLING     SHOULDER SURGERY     bilateral arthroscopies   TEE WITHOUT CARDIOVERSION N/A 07/25/2023   Procedure: TRANSESOPHAGEAL ECHOCARDIOGRAM (TEE);  Surgeon: Dorita Garter, MD;  Location: [facility];  Service: Cardiovascular;  Laterality: N/A;   VAGINAL HYSTERECTOMY  1979   Social History:  reports that she has been smoking cigarettes. She has a 75 pack-year smoking history. She has never used smokeless tobacco. She reports that she does not drink alcohol and does not use drugs.  Allergies  Allergen Reactions   Iodine Anaphylaxis and Other (See Comments)    Pt states that she is allergic to ingested iodine only, okay for betadine.     Iodine I 131 Tositumomab Anaphylaxis   Shellfish Allergy Anaphylaxis   Codeine Nausea And Vomiting   Morphine  Sulfate Nausea And Vomiting   Irbesartan Other (See Comments)     Unknown  (Avapro)   Sulfa Antibiotics Other (See Comments)    Fever     Family History  Problem Relation Age of Onset   Hypertension Mother  Heart disease Mother 67       MI   Diabetes Father    Diabetes Sister    Hypertension Sister    Diabetes Brother    Hypertension Brother    Heart disease Brother 68       CAD   Cancer Sister        Multiple myloma   Diabetes Brother     Prior to Admission medications   Medication Sig Start Date End Date Taking? Authorizing Provider  acetaminophen  (TYLENOL ) 500 MG tablet Take 1,000 mg by mouth every 6 (six) hours as needed for moderate pain.    [provider]  albuterol  (VENTOLIN  HFA) 108 (90 Base) MCG/ACT inhaler Inhale 2 puffs into the lungs every 6 (six) hours as needed for wheezing or shortness of breath. 07/19/23 07/18/24  [provider]  amLODipine  (NORVASC ) 10 MG tablet Take 1 tablet (10 mg total) by mouth daily. 07/27/23   Luna Salinas, MD  aspirin  EC 81 MG tablet Take 81 mg by mouth at bedtime. Swallow whole.    [provider]  BD INSULIN  SYRINGE U/F 31G X 5/16  1 ML MISC USE 1 SYRINGE AS DIRECTED 07/10/20   [provider]  calcitRIOL  (ROCALTROL ) 0.25 MCG capsule Take 0.25 mcg by mouth at bedtime. 08/17/21   [provider]  cholecalciferol (VITAMIN D3) 25 MCG (1000 UNIT) tablet Take 1,000 Units by mouth daily.    [provider]  Continuous Glucose Sensor (FREESTYLE LIBRE 2 SENSOR) MISC  05/07/23   [provider]  cyclobenzaprine  (FLEXERIL ) 10 MG tablet Take 1 tablet (10 mg total) by mouth 3 (three) times daily as needed for muscle spasms. 07/26/23   Amin, Sumayya, MD  D-MANNOSE PO Take 2 tablets by mouth at bedtime.    [provider]  diazepam  (VALIUM ) 5 MG tablet Take 5 mg by mouth 2 (two) times daily as needed. 08/01/23   [provider]  diphenhydrAMINE (BENADRYL) 25 MG tablet Take 50 mg by mouth at bedtime as needed for sleep.    [provider]  docusate sodium  (COLACE) 100 MG capsule Take 1 capsule (100 mg total) by mouth 2 (two) times daily. Patient taking differently: Take 100 mg by mouth at bedtime as needed for mild constipation or moderate constipation. 05/21/22   Garry Kansas, MD  epoetin  alfa-epbx (RETACRIT ) 4000 UNIT/ML injection Inject 1 mL (4,000 Units total) into the skin once a week. 07/31/23   Amin, Sumayya, MD  estradiol  (ESTRACE ) 0.1 MG/GM vaginal cream Place 1 Applicatorful vaginally 3 (three) times a week. 11/23/21   [provider]  Fe Fum-Vit C-Vit B12-FA (TRIGELS-F FORTE) CAPS capsule Take 1 capsule by mouth daily after breakfast. 07/27/23   Luna Salinas, MD  feeding supplement (BOOST HIGH PROTEIN) LIQD Take 1 Container by mouth 2 (two) times daily between meals.    [provider]  insulin  detemir (LEVEMIR ) 100 UNIT/ML injection Inject 100-120 Units into the skin daily.    [provider]  losartan  (COZAAR ) 100 MG tablet Take 1 tablet (100 mg total) by mouth daily. Patient taking differently: Take 100 mg by mouth at bedtime. 09/26/15   Rex Castor, MD  metoprolol  succinate (TOPROL -XL) 100 MG 24 hr tablet Take 100 mg by mouth at bedtime.    [provider]  Multiple Vitamins-Minerals (PRESERVISION AREDS 2+MULTI VIT PO) Take 1 capsule by mouth daily.    [provider]  nystatin  (MYCOSTATIN ) 100000 UNIT/ML suspension Take 5 mLs (500,000 Units total) by  mouth 4 (four) times daily. 07/28/23   Alica Inks, MD  [facility] ULTRA test strip 4 (four) times daily. 07/11/20   [provider]  rosuvastatin  (CRESTOR ) 5 MG tablet Take 5 mg by mouth daily.    [provider]  sennosides-docusate sodium  (SENOKOT-S) 8.6-50 MG tablet Take 1 tablet by mouth daily as needed for constipation.    [provider]  sodium bicarbonate  650 MG tablet Take 1 tablet (650 mg total) by mouth 2 (two) times daily. 07/26/23 07/25/24  Amin, Sumayya, MD  sucralfate  (CARAFATE ) 1 g tablet Take 1 tablet (1 g total) by mouth 2 (two) times daily as needed (acid reflux). 07/26/23   Amin, Sumayya, MD  Vibegron (GEMTESA) 75 MG TABS Take 75 mg by mouth at bedtime.    [provider]  zolpidem  (AMBIEN ) 10 MG tablet Take 10 mg by mouth at bedtime as needed for sleep. 12/20/21   [provider]    Physical Exam: Vitals:   11/28/23 0233 11/28/23 0300 11/28/23 0330  BP: (!) 202/74 (!) 194/74 (!) 180/100  Pulse: 78 77 76  Resp: (!) 25 17 (!) 21  Temp: 98 F (36.7 C)    TempSrc: Oral    SpO2: 98% 100% 94%   Physical Exam Vitals and nursing note reviewed.  Constitutional:      General: She is not in acute distress. HENT:     Head: Normocephalic and atraumatic.   Cardiovascular:     Rate and Rhythm: Normal rate and regular rhythm.     Heart sounds: Normal heart sounds.  Pulmonary:     Effort: Tachypnea present.     Breath sounds: Normal breath sounds.  Abdominal:     Palpations: Abdomen is soft.     Tenderness: There is no abdominal tenderness.   Musculoskeletal:     Right lower leg: Edema present.      Left lower leg: Edema present.   Neurological:     Mental Status: Mental status is at baseline.     Labs on Admission: I have personally reviewed following labs and imaging studies  CBC: Recent Labs  Lab 11/28/23 0246  WBC 11.1*  NEUTROABS 9.7*  HGB 9.0*  HCT 27.4*  MCV 92.3  PLT 292   Basic Metabolic Panel: Recent Labs  Lab 11/28/23 0246  NA 123*  K 5.2*  CL 94*  CO2 19*  GLUCOSE 207*  BUN 67*  CREATININE 3.59*  CALCIUM  8.8*  MG 2.2   GFR: CrCl cannot be calculated (Unknown ideal weight.). Liver Function Tests: Recent Labs  Lab 11/28/23 0246  AST 17  ALT 11  ALKPHOS 95  BILITOT 0.7  PROT 6.7  ALBUMIN 2.9*   No results for input(s): LIPASE, AMYLASE in the last 168 hours. No results for input(s): AMMONIA in the last 168 hours. Coagulation Profile: No results for input(s): INR, PROTIME in the last 168 hours. Cardiac Enzymes: No results for input(s): CKTOTAL, CKMB, CKMBINDEX, TROPONINI in the last 168 hours. BNP (last 3 results) No results for input(s): PROBNP in the last 8760 hours. HbA1C: No results for input(s): HGBA1C in the last 72 hours. CBG: No results for input(s): GLUCAP in the last 168 hours. Lipid Profile: No results for input(s): CHOL, HDL, LDLCALC, TRIG, CHOLHDL, LDLDIRECT in the last 72 hours. Thyroid Function Tests: No results for input(s): TSH, T4TOTAL, FREET4, T3FREE, THYROIDAB in the last 72 hours. Anemia Panel: No results for input(s): VITAMINB12, FOLATE, FERRITIN, TIBC, IRON, RETICCTPCT in the last 72 hours. Urine analysis:  Component Value Date/Time   COLORURINE YELLOW (A) 07/19/2023 2138   APPEARANCEUR HAZY (A) 07/19/2023 2138   LABSPEC 1.017 07/19/2023 2138   PHURINE 5.0 07/19/2023 2138   GLUCOSEU 50 (A) 07/19/2023 2138   HGBUR NEGATIVE 07/19/2023 2138   BILIRUBINUR NEGATIVE 07/19/2023 2138   KETONESUR NEGATIVE 07/19/2023 2138   PROTEINUR >=300 (A) 07/19/2023  2138   UROBILINOGEN 0.2 10/12/2013 1551   NITRITE NEGATIVE 07/19/2023 2138   LEUKOCYTESUR NEGATIVE 07/19/2023 2138    Radiological Exams on Admission: DG Chest Portable 1 View Result Date: 11/28/2023 CLINICAL DATA:  Difficulty breathing EXAM: PORTABLE CHEST 1 VIEW COMPARISON:  07/19/2023 FINDINGS: Cardiac shadow is mildly enlarged. Aortic calcifications are seen. Mild vascular congestion is noted. Small right-sided pleural effusion is noted. Metallic densities are noted symmetrically over the upper chest likely artifactual in nature. IMPRESSION: Small right pleural effusion and vascular congestion. Electronically Signed   By: Violeta Grey M.D.   On: 11/28/2023 02:59   Data Reviewed for HPI: Relevant notes from primary care and specialist visits, past discharge summaries as available in EHR, including Care Everywhere. Prior diagnostic testing as pertinent to current admission diagnoses Updated medications and problem lists for reconciliation ED course, including vitals, labs, imaging, treatment and response to treatment Triage notes, nursing and pharmacy notes and ED provider's notes Notable results as noted above in HPI      Assessment and Plan: * Hypertensive urgency/emergency Possible new onset CHF Patient with elevated BNP, albeit with worsening renal function but chest x-ray showing pulmonary vascular congestion.  Appears slightly fluid overloaded on exam Received IV Lasix in the ED-will continue low-dose due to renal dysfunction Continue home amlodipine  and metoprolol  Hydralazine  for additional BP control Daily weights with intake and output monitoring Had an echo 07/2023 that showed EF 55 to 60% and indeterminate diastolic parameters-can consider repeating  Acute dyspnea Suspect CHF Differential includes mild aspiration during MRI, given history of esophageal dysmotility requiring Botox  shots Aspiration precautions  COPD with acute exacerbation (HCC) Schedule and as needed  nebulized bronchodilators IV steroids Antitussives, flutter valve  Chest pain CAD Troponin slightly elevated but EKG nonacute Suspect chest pain is related to increased work of breathing from COPD Continue to trend Continue home GDMT-Continue rosuvastatin , metoprolol , losartan  and aspirin   Acute renal failure superimposed on stage 4 chronic kidney disease (HCC) Monitor renal function and avoid nephrotoxins Monitor for worsening with IV Lasix  Hyponatremia History of SIADH Suspect an element of fluid overload Expecting improvement with fluid restriction and diuretic therapy Monitor sodium    Esophageal dysphagia Treated with Botox  via EGD, last treatment 08/2022 Aspiration precautions   OSA (obstructive sleep apnea) CPAP nightly    Type 2 diabetes mellitus with hyperlipidemia (HCC) Continue basal insulin  Sliding scale coverage       DVT prophylaxis: Lovenox   Consults: none  Advance Care Planning:   Code Status: Prior   Family Communication: Daughter at bedside  Disposition Plan: Back to previous home environment  Severity of Illness: The appropriate patient status for this patient is OBSERVATION. Observation status is judged to be reasonable and necessary in order to provide the required intensity of service to ensure the patient's safety. The patient's presenting symptoms, physical exam findings, and initial radiographic and laboratory data in the context of their medical condition is felt to place them at decreased risk for further clinical deterioration. Furthermore, it is anticipated that the patient will be medically stable for discharge from the hospital within 2 midnights of admission.   Author: [place] La  Katheen Palma, MD 11/28/2023 4:54 AM  For on call review [email or web address].

## 2023-11-28 NOTE — Progress Notes (Signed)
 Heart Failure Navigator Progress Note  Assessed for Heart & Vascular TOC clinic readiness.  Patient does not meet criteria due to current [facility] patient.   Navigator will sign off at this time.  Roxy Horseman, RN, BSN [facility] Heart Failure Navigator Secure Chat Only

## 2023-11-28 NOTE — Progress Notes (Signed)
 [facility]  ROUNDING NOTE   Subjective:   Patient well-known to us  as we follow her for chronic kidney disease stage IV. Comes in now with increasing shortness of breath, lower extremity edema, and signs of volume overload. Daughter at the bedside as well. Renal function quite low at the moment with EGFR of 13. Was on Lasix. Case discussed with hospitalist and we agreed to give a dose of tolvaptan tomorrow.  Objective:  Vital signs in last 24 hours:  Temp:  [97.8 F (36.6 C)-99.1 F (37.3 C)] 97.8 F (36.6 C) (06/13 1233) Pulse Rate:  [75-84] 78 (06/13 1426) Resp:  [17-25] 24 (06/13 0600) BP: (157-202)/(55-100) 157/55 (06/13 1426) SpO2:  [93 %-100 %] 100 % (06/13 1426) Weight:  [79.4 kg] 79.4 kg (06/13 0555)  Weight change:  Filed Weights   11/28/23 0555  Weight: 79.4 kg    Intake/Output: I/O last 3 completed shifts: In: -  Out: 300 [Urine:300]   Intake/Output this shift:  Total I/O In: 240 [P.O.:240] Out: 200 [Urine:200]  Physical Exam: General: No acute distress  Head: Normocephalic, atraumatic. Moist oral mucosal membranes  Neck: Supple  Lungs:  Basilar rales, normal effort  Heart: S1S2 no rubs  Abdomen:  Soft, nontender, bowel sounds present  Extremities: 2+ peripheral edema.  Neurologic: Awake, alert, following commands  Skin: No acute rash  Access: No hemodialysis access    Basic Metabolic Panel: Recent Labs  Lab 11/28/23 0246 11/28/23 1452  NA 123* 121*  K 5.2* 5.1  CL 94*  --   CO2 19*  --   GLUCOSE 207*  --   BUN 67*  --   CREATININE 3.59*  --   CALCIUM  8.8*  --   MG 2.2  --     Liver Function Tests: Recent Labs  Lab 11/28/23 0246  AST 17  ALT 11  ALKPHOS 95  BILITOT 0.7  PROT 6.7  ALBUMIN 2.9*   No results for input(s): LIPASE, AMYLASE in the last 168 hours. No results for input(s): AMMONIA in the last 168 hours.  CBC: Recent Labs  Lab 11/28/23 0246  WBC 11.1*  NEUTROABS 9.7*  HGB 9.0*  HCT 27.4*   MCV 92.3  PLT 292    Cardiac Enzymes: No results for input(s): CKTOTAL, CKMB, CKMBINDEX, TROPONINI in the last 168 hours.  BNP: Invalid input(s): POCBNP  CBG: Recent Labs  Lab 11/28/23 0830 11/28/23 1235  GLUCAP 235* 305*    Microbiology: Results for orders placed or performed during the hospital encounter of 08/28/23  Blood culture (routine single)     Status: None   Collection Time: 08/28/23 12:40 PM   Specimen: BLOOD  Result Value Ref Range Status   Specimen Description BLOOD RIGHT ANTECUBITAL  Final   Special Requests   Final    BOTTLES DRAWN AEROBIC AND ANAEROBIC Blood Culture adequate volume   Culture   Final    NO GROWTH 5 DAYS Performed at [facility], [address], [place], [place] [address]    Report Status 09/02/2023 FINAL  Final  Blood culture (routine single)     Status: None   Collection Time: 08/28/23 12:40 PM   Specimen: BLOOD  Result Value Ref Range Status   Specimen Description BLOOD LEFT ANTECUBITAL  Final   Special Requests   Final    BOTTLES DRAWN AEROBIC AND ANAEROBIC Blood Culture adequate volume   Culture   Final    NO GROWTH 5 DAYS Performed at [facility], [address]  [address] [place], [place] [address]    Report Status 09/02/2023 FINAL  Final    Coagulation Studies: No results for input(s): LABPROT, INR in the last 72 hours.  Urinalysis: No results for input(s): COLORURINE, LABSPEC, PHURINE, GLUCOSEU, HGBUR, BILIRUBINUR, KETONESUR, PROTEINUR, UROBILINOGEN, NITRITE, LEUKOCYTESUR in the last 72 hours.  Invalid input(s): APPERANCEUR    Imaging: US  Venous Img Lower Bilateral Result Date: 11/28/2023 CLINICAL DATA:  New onset left greater than right lower extremity swelling, without findings of CHF. EXAM: BILATERAL LOWER EXTREMITY VENOUS DOPPLER ULTRASOUND TECHNIQUE: Gray-scale sonography with graded compression, as well as color Doppler and duplex ultrasound were performed to  evaluate the lower extremity deep venous systems from the level of the common femoral vein and including the common femoral, femoral, profunda femoral, popliteal and calf veins including the posterior tibial, peroneal and gastrocnemius veins when visible. The superficial great saphenous vein was also interrogated. Spectral Doppler was utilized to evaluate flow at rest and with distal augmentation maneuvers in the common femoral, femoral and popliteal veins. COMPARISON:  Left lower extremity DVT exam 08/26/2019. FINDINGS: RIGHT LOWER EXTREMITY Common Femoral Vein: No evidence of thrombus. Normal compressibility, respiratory phasicity and response to augmentation. Saphenofemoral Junction: No evidence of thrombus. Normal compressibility and flow on color Doppler imaging. Profunda Femoral Vein: No evidence of thrombus. Normal compressibility and flow on color Doppler imaging. Femoral Vein: No evidence of thrombus. Normal compressibility, respiratory phasicity and response to augmentation. Popliteal Vein: No evidence of thrombus. Normal compressibility, respiratory phasicity and response to augmentation. Calf Veins: No evidence of thrombus. Normal compressibility and flow on color Doppler imaging. Superficial Great Saphenous Vein: No evidence of thrombus. Normal compressibility. Venous Reflux:  None. Other Findings:  Pulsatile venous waveforms. LEFT LOWER EXTREMITY Common Femoral Vein: No evidence of thrombus. Normal compressibility, respiratory phasicity and response to augmentation. Saphenofemoral Junction: No evidence of thrombus. Normal compressibility and flow on color Doppler imaging. Profunda Femoral Vein: No evidence of thrombus. Normal compressibility and flow on color Doppler imaging. Femoral Vein: No evidence of thrombus. Normal compressibility, respiratory phasicity and response to augmentation. Popliteal Vein: No evidence of thrombus. Normal compressibility, respiratory phasicity and response to  augmentation. Calf Veins: No evidence of thrombus. Normal compressibility and flow on color Doppler imaging. Superficial Great Saphenous Vein: No evidence of thrombus. Normal compressibility. Venous Reflux:  None. Other Findings:  Pulsatile venous waveforms. IMPRESSION: 1. No evidence of deep venous thrombosis in either lower extremity. 2. Pulsatile venous waveforms, which can be seen with right heart failure or tricuspid regurgitation. Electronically Signed   By: Denman Fischer M.D.   On: 11/28/2023 04:56   DG Chest Portable 1 View Result Date: 11/28/2023 CLINICAL DATA:  Difficulty breathing EXAM: PORTABLE CHEST 1 VIEW COMPARISON:  07/19/2023 FINDINGS: Cardiac shadow is mildly enlarged. Aortic calcifications are seen. Mild vascular congestion is noted. Small right-sided pleural effusion is noted. Metallic densities are noted symmetrically over the upper chest likely artifactual in nature. IMPRESSION: Small right pleural effusion and vascular congestion. Electronically Signed   By: Violeta Grey M.D.   On: 11/28/2023 02:59     Medications:     amLODipine   10 mg Oral Daily   aspirin  EC  81 mg Oral QHS   enoxaparin  (LOVENOX ) injection  30 mg Subcutaneous Q24H   guaiFENesin   600 mg Oral BID   insulin  aspart  0-5 Units Subcutaneous QHS   insulin  aspart  0-9 Units Subcutaneous TID WC   insulin  glargine-yfgn  10 Units Subcutaneous BID   ipratropium-albuterol   3 mL  Nebulization Q6H   losartan   100 mg Oral QHS   methylPREDNISolone  (SOLU-MEDROL ) injection  40 mg Intravenous Q24H   Followed by   Cecily Cohen ON 11/29/2023] predniSONE   40 mg Oral Q breakfast   metoprolol  succinate  100 mg Oral QHS   nicotine   21 mg Transdermal Daily   rosuvastatin   5 mg Oral Daily   sodium bicarbonate   650 mg Oral BID   acetaminophen  **OR** acetaminophen , albuterol , diazepam , guaiFENesin -dextromethorphan, hydrALAZINE , ondansetron  **OR** ondansetron  (ZOFRAN ) IV, sucralfate   Assessment/ Plan:  78 y.o. female with past  medical history of hypertension, long-standing diabetes mellitus type 2, lower extremity edema, hyperlipidemia, tobacco abuse, prior history of CVA, COPD, obstructive sleep apnea, lumbar spinal fusion, left upper lobe lung mass who presents with volume overload and increasing shortness of breath.  1.  Acute kidney injury/chronic kidney disease stage IV/diabetes mellitus type 2 with chronic kidney disease/proteinuria.  Patient with worsening renal function.  eGFR down to 13.  We previous had a conversation about the potential for renal placement therapy in the relative near future.  Suspect that diuretics could worsen renal function as she is volume overloaded now.  Case discussed with hospitalist.  We decided to discontinue Lasix and trial the patient on tolvaptan tomorrow.  2.  Hyponatremia.  Likely secondary to volume overload.  Administer tolvaptan 15 mg x 1 tomorrow AM.  3.  Volume overload.  Repeat 2D echocardiogram pending at the moment.  BNP elevated at 2450.  4.  Anemia of chronic kidney disease.  Hemoglobin 9.0.  Continue to monitor for now.   LOS: 0 Vinnie Bobst 6/13/20253:33 PM

## 2023-11-28 NOTE — Assessment & Plan Note (Signed)
 Suspect CHF Differential includes mild aspiration during MRI, given history of esophageal dysmotility requiring Botox  shots Aspiration precautions

## 2023-11-28 NOTE — Assessment & Plan Note (Addendum)
 Possible new onset CHF Patient with elevated BNP, albeit with worsening renal function but chest x-ray showing pulmonary vascular congestion.  Appears slightly fluid overloaded on exam Received IV Lasix in the ED-will continue low-dose due to renal dysfunction Continue home amlodipine  and metoprolol  Hydralazine  for additional BP control Daily weights with intake and output monitoring Had an echo 07/2023 that showed EF 55 to 60% and indeterminate diastolic parameters-can consider repeating

## 2023-11-28 NOTE — Assessment & Plan Note (Addendum)
 CAD Troponin slightly elevated but EKG nonacute Suspect chest pain is related to increased work of breathing from COPD Continue to trend Continue home GDMT-Continue rosuvastatin , metoprolol , losartan  and aspirin 

## 2023-11-28 NOTE — Assessment & Plan Note (Signed)
 History of SIADH Suspect an element of fluid overload Expecting improvement with fluid restriction and diuretic therapy Monitor sodium

## 2023-11-28 NOTE — Progress Notes (Addendum)
 PROGRESS NOTE    Mckenzie Lawrence  ZOX:[id number] DOB: 06-Aug-1945 DOA: 11/28/2023 PCP: Lyle San, MD   Assessment & Plan:   Principal Problem:   Hypertensive urgency/emergency Active Problems:   COPD with acute exacerbation (HCC)   Acute dyspnea   CAD (coronary artery disease)   Chest pain   Acute renal failure superimposed on stage 4 chronic kidney disease (HCC)   Hyponatremia   Hypertensive emergency  Assessment and Plan: Hypertensive urgency/emergency: resolved emergency but still w/ HTN. Continue on metoprolol , amlodipine , losartan . IV hydralazine  prn   Possible new onset CHF: w/ elevated BNP, CXR showing vascular congestion, pitting LE edema & dyspnea. S/p IV lasix. Monitor I/Os. Repeat echo ordered    COPD  exacerbation: continue on steroids, bronchodilators & encourage incentive spirometry    Chest pain: w/ hx of CAD. Troponins are minimally elevated. EKG shows no ischemic changes. Continue on metoprolol , losartan , statin, aspiirn. [name] daughter does not want pt to have narcotics if possible b/c of increased delirium/hallucinations/confusion  AKI on CKDIV: Cr is trending up. Avoid nephrotoxic meds. Nephro consulted   Hyperkalemia: s/p lasix & lokelma. Repeat potassium ordered   Hyponatremia: w/ hx of SIADH. Repeat Na ordered. Nephro consulted   Acute diarrhea: x 3 days. GI PCR panel ordered    Esophageal dysphagia: s/p botox  via EGD in 08/2022. Aspiration precautions. Continue w/ supportive care    OSA: CPAP qhs  DM2: well controlled, HbA1c 6.1. Continue on glargine, SSI w/ accuchekc       DVT prophylaxis: lovenox  Code Status: full  Family Communication: discussed pt's care w/ pt's daughter at bedside and answered her questions  Disposition Plan: depends on PT/OT recs (not consulted yet)  Level of care: Progressive  Status is: Inpatient Remains inpatient appropriate because: severity of illness    Consultants:  Nephro   Procedures:    Antimicrobials:   Subjective: Pt c/o cough  Objective: Vitals:   11/28/23 0600 11/28/23 0617 11/28/23 0754 11/28/23 0828  BP: (!) 201/71 (!) 198/65  (!) 169/60  Pulse: 75   84  Resp: (!) 24     Temp: 99.1 F (37.3 C)   98.2 F (36.8 C)  TempSrc: Oral     SpO2: 99%  99% 99%  Weight:        Intake/Output Summary (Last 24 hours) at 11/28/2023 0931 Last data filed at 11/28/2023 0554 Gross per 24 hour  Intake --  Output 300 ml  Net -300 ml   Filed Weights   11/28/23 0555  Weight: 79.4 kg    Examination:  General exam: Appears calm and comfortable  Respiratory system: Clear to auscultation. Respiratory effort normal. Cardiovascular system: S1 & S2 heard, RRR. No JVD, murmurs, rubs, gallops or clicks. No pedal edema. Gastrointestinal system: Abdomen is nondistended, soft and nontender. No organomegaly or masses felt. Normal bowel sounds heard. Central nervous system: Alert and oriented. No focal neurological deficits. Extremities: Symmetric 5 x 5 power. Skin: No rashes, lesions or ulcers Psychiatry: Judgement and insight appear normal. Mood & affect appropriate.     Data Reviewed: I have personally reviewed following labs and imaging studies  CBC: Recent Labs  Lab 11/28/23 0246  WBC 11.1*  NEUTROABS 9.7*  HGB 9.0*  HCT 27.4*  MCV 92.3  PLT 292   Basic Metabolic Panel: Recent Labs  Lab 11/28/23 0246  NA 123*  K 5.2*  CL 94*  CO2 19*  GLUCOSE 207*  BUN 67*  CREATININE 3.59*  CALCIUM  8.8*  MG 2.2  GFR: Estimated Creatinine Clearance: 14.5 mL/min (A) (by C-G formula based on SCr of 3.59 mg/dL (H)). Liver Function Tests: Recent Labs  Lab 11/28/23 0246  AST 17  ALT 11  ALKPHOS 95  BILITOT 0.7  PROT 6.7  ALBUMIN 2.9*   No results for input(s): LIPASE, AMYLASE in the last 168 hours. No results for input(s): AMMONIA in the last 168 hours. Coagulation Profile: No results for input(s): INR, PROTIME in the last 168 hours. Cardiac  Enzymes: No results for input(s): CKTOTAL, CKMB, CKMBINDEX, TROPONINI in the last 168 hours. BNP (last 3 results) No results for input(s): PROBNP in the last 8760 hours. HbA1C: No results for input(s): HGBA1C in the last 72 hours. CBG: Recent Labs  Lab 11/28/23 0830  GLUCAP 235*   Lipid Profile: No results for input(s): CHOL, HDL, LDLCALC, TRIG, CHOLHDL, LDLDIRECT in the last 72 hours. Thyroid Function Tests: No results for input(s): TSH, T4TOTAL, FREET4, T3FREE, THYROIDAB in the last 72 hours. Anemia Panel: No results for input(s): VITAMINB12, FOLATE, FERRITIN, TIBC, IRON, RETICCTPCT in the last 72 hours. Sepsis Labs: No results for input(s): PROCALCITON, LATICACIDVEN in the last 168 hours.  No results found for this or any previous visit (from the past 240 hours).       Radiology Studies: US  Venous Img Lower Bilateral Result Date: 11/28/2023 CLINICAL DATA:  New onset left greater than right lower extremity swelling, without findings of CHF. EXAM: BILATERAL LOWER EXTREMITY VENOUS DOPPLER ULTRASOUND TECHNIQUE: Gray-scale sonography with graded compression, as well as color Doppler and duplex ultrasound were performed to evaluate the lower extremity deep venous systems from the level of the common femoral vein and including the common femoral, femoral, profunda femoral, popliteal and calf veins including the posterior tibial, peroneal and gastrocnemius veins when visible. The superficial great saphenous vein was also interrogated. Spectral Doppler was utilized to evaluate flow at rest and with distal augmentation maneuvers in the common femoral, femoral and popliteal veins. COMPARISON:  Left lower extremity DVT exam 08/26/2019. FINDINGS: RIGHT LOWER EXTREMITY Common Femoral Vein: No evidence of thrombus. Normal compressibility, respiratory phasicity and response to augmentation. Saphenofemoral Junction: No evidence of thrombus. Normal  compressibility and flow on color Doppler imaging. Profunda Femoral Vein: No evidence of thrombus. Normal compressibility and flow on color Doppler imaging. Femoral Vein: No evidence of thrombus. Normal compressibility, respiratory phasicity and response to augmentation. Popliteal Vein: No evidence of thrombus. Normal compressibility, respiratory phasicity and response to augmentation. Calf Veins: No evidence of thrombus. Normal compressibility and flow on color Doppler imaging. Superficial Great Saphenous Vein: No evidence of thrombus. Normal compressibility. Venous Reflux:  None. Other Findings:  Pulsatile venous waveforms. LEFT LOWER EXTREMITY Common Femoral Vein: No evidence of thrombus. Normal compressibility, respiratory phasicity and response to augmentation. Saphenofemoral Junction: No evidence of thrombus. Normal compressibility and flow on color Doppler imaging. Profunda Femoral Vein: No evidence of thrombus. Normal compressibility and flow on color Doppler imaging. Femoral Vein: No evidence of thrombus. Normal compressibility, respiratory phasicity and response to augmentation. Popliteal Vein: No evidence of thrombus. Normal compressibility, respiratory phasicity and response to augmentation. Calf Veins: No evidence of thrombus. Normal compressibility and flow on color Doppler imaging. Superficial Great Saphenous Vein: No evidence of thrombus. Normal compressibility. Venous Reflux:  None. Other Findings:  Pulsatile venous waveforms. IMPRESSION: 1. No evidence of deep venous thrombosis in either lower extremity. 2. Pulsatile venous waveforms, which can be seen with right heart failure or tricuspid regurgitation. Electronically Signed   By: Aleck Hurdle.D.  On: 11/28/2023 04:56   DG Chest Portable 1 View Result Date: 11/28/2023 CLINICAL DATA:  Difficulty breathing EXAM: PORTABLE CHEST 1 VIEW COMPARISON:  07/19/2023 FINDINGS: Cardiac shadow is mildly enlarged. Aortic calcifications are seen. Mild  vascular congestion is noted. Small right-sided pleural effusion is noted. Metallic densities are noted symmetrically over the upper chest likely artifactual in nature. IMPRESSION: Small right pleural effusion and vascular congestion. Electronically Signed   By: Violeta Grey M.D.   On: 11/28/2023 02:59        Scheduled Meds:  amLODipine   10 mg Oral Daily   aspirin  EC  81 mg Oral QHS   enoxaparin  (LOVENOX ) injection  30 mg Subcutaneous Q24H   furosemide  20 mg Intravenous Q12H   guaiFENesin   600 mg Oral BID   insulin  aspart  0-5 Units Subcutaneous QHS   insulin  aspart  0-9 Units Subcutaneous TID WC   insulin  glargine-yfgn  10 Units Subcutaneous BID   ipratropium-albuterol   3 mL Nebulization Q6H   methylPREDNISolone  (SOLU-MEDROL ) injection  40 mg Intravenous Q24H   Followed by   Cecily Cohen ON 11/29/2023] predniSONE   40 mg Oral Q breakfast   metoprolol  succinate  100 mg Oral QHS   rosuvastatin   5 mg Oral Daily   sodium bicarbonate   650 mg Oral BID   sodium zirconium cyclosilicate  10 g Oral Once   Continuous Infusions:   LOS: 0 days       Alphonsus Jeans, MD [facility] Pager [phone]  If 7PM-7AM, please contact night-coverage [email or web address] 11/28/2023, 9:31 AM

## 2023-11-28 NOTE — Assessment & Plan Note (Signed)
 Schedule and as needed nebulized bronchodilators IV steroids Antitussives, flutter valve

## 2023-11-29 ENCOUNTER — Inpatient Hospital Stay
Admit: 2023-11-29 | Discharge: 2023-11-29 | Disposition: A | Discharge status: Home / Self Care | Attending: Internal Medicine | Admitting: Internal Medicine

## 2023-11-29 DIAGNOSIS — I5031 Acute diastolic (congestive) heart failure: Secondary | ICD-10-CM

## 2023-11-29 DIAGNOSIS — E871 Hypo-osmolality and hyponatremia: Secondary | ICD-10-CM

## 2023-11-29 LAB — ECHOCARDIOGRAM COMPLETE
AR max vel: 1.84 cm2
AV Peak grad: 8.8 mmHg
Ao pk vel: 1.48 m/s
Area-P 1/2: 4.86 cm2
Calc EF: 55.3 %
S' Lateral: 3.8 cm
Single Plane A2C EF: 50.1 %
Single Plane A4C EF: 59.4 %
Weight: 2825.42 [oz_av]

## 2023-11-29 LAB — GLUCOSE, CAPILLARY
Glucose-Capillary: 263 mg/dL — ABNORMAL HIGH (ref 70–99)
Glucose-Capillary: 189 mg/dL — ABNORMAL HIGH (ref 70–99)
Glucose-Capillary: 226 mg/dL — ABNORMAL HIGH (ref 70–99)
Glucose-Capillary: 336 mg/dL — ABNORMAL HIGH (ref 70–99)

## 2023-11-29 LAB — BASIC METABOLIC PANEL WITH GFR
Anion gap: 10 (ref 5–15)
BUN: 73 mg/dL — ABNORMAL HIGH (ref 8–23)
CO2: 19 mmol/L — ABNORMAL LOW (ref 22–32)
Calcium: 8.5 mg/dL — ABNORMAL LOW (ref 8.9–10.3)
Chloride: 96 mmol/L — ABNORMAL LOW (ref 98–111)
Creatinine, Ser: 3.95 mg/dL — ABNORMAL HIGH (ref 0.44–1.00)
GFR, Estimated: 11 mL/min — ABNORMAL LOW (ref >60)
Glucose, Bld: 282 mg/dL — ABNORMAL HIGH (ref 70–99)
Potassium: 5.6 mmol/L — ABNORMAL HIGH (ref 3.5–5.1)
Sodium: 125 mmol/L — ABNORMAL LOW (ref 135–145)

## 2023-11-29 LAB — CBC
HCT: 23.6 % — ABNORMAL LOW (ref 36.0–46.0)
Hemoglobin: 7.9 g/dL — ABNORMAL LOW (ref 12.0–15.0)
MCH: 30.4 pg (ref 26.0–34.0)
MCHC: 33.5 g/dL (ref 30.0–36.0)
MCV: 90.8 fL (ref 80.0–100.0)
Platelets: 253 10*3/uL (ref 150–400)
RBC: 2.6 MIL/uL — ABNORMAL LOW (ref 3.87–5.11)
RDW: 14.2 % (ref 11.5–15.5)
WBC: 14.6 10*3/uL — ABNORMAL HIGH (ref 4.0–10.5)
nRBC: 0 % (ref 0.0–0.2)

## 2023-11-29 LAB — POTASSIUM: Potassium: 5.1 mmol/L (ref 3.5–5.1)

## 2023-11-29 LAB — SODIUM: Sodium: 125 mmol/L — ABNORMAL LOW (ref 135–145)

## 2023-11-29 MED ORDER — ZOLPIDEM TARTRATE 5 MG PO TABS
5.0000 mg | ORAL_TABLET | Freq: Every evening | ORAL | Status: DC | PRN
  Administered 2023-11-30 – 2023-12-01 (×2): 5 mg via ORAL
  Filled 2023-11-29 (×4): qty 1

## 2023-11-29 MED ORDER — SODIUM ZIRCONIUM CYCLOSILICATE 10 G PO PACK
10.0000 g | PACK | Freq: Once | ORAL | Status: DC
  Filled 2023-11-29: qty 1

## 2023-11-29 MED ORDER — OXYCODONE-ACETAMINOPHEN 5-325 MG PO TABS
1.0000 | ORAL_TABLET | Freq: Four times a day (QID) | ORAL | Status: DC | PRN
  Administered 2023-11-30 – 2023-12-05 (×4): 1 via ORAL
  Filled 2023-11-29 (×7): qty 1

## 2023-11-29 MED ORDER — OXYCODONE-ACETAMINOPHEN 5-325 MG PO TABS
2.0000 | ORAL_TABLET | Freq: Four times a day (QID) | ORAL | Status: DC | PRN
  Administered 2023-12-03: 2 via ORAL

## 2023-11-29 MED ORDER — BENZONATATE 100 MG PO CAPS
200.0000 mg | ORAL_CAPSULE | Freq: Three times a day (TID) | ORAL | Status: DC
  Administered 2023-11-30 – 2023-12-05 (×17): 200 mg via ORAL
  Filled 2023-11-29 (×21): qty 2

## 2023-11-29 NOTE — Progress Notes (Signed)
  Echocardiogram 2D Echocardiogram has been performed.  Nichola Barges 11/29/2023, 9:07 AM

## 2023-11-29 NOTE — Progress Notes (Signed)
 RT called to give patient PRN breathing treatment. Patients family stated patient needed a little while before taking the treatment. Told family to let RN know to call when patient is ready.

## 2023-11-29 NOTE — Plan of Care (Signed)
  Problem: Coping: Goal: Ability to adjust to condition or change in health will improve Outcome: Progressing   Problem: Nutritional: Goal: Maintenance of adequate nutrition will improve Outcome: Progressing   Problem: Clinical Measurements: Goal: Diagnostic test results will improve Outcome: Progressing Goal: Respiratory complications will improve Outcome: Progressing Goal: Cardiovascular complication will be avoided Outcome: Progressing   Problem: Activity: Goal: Risk for activity intolerance will decrease Outcome: Progressing   Problem: Nutrition: Goal: Adequate nutrition will be maintained Outcome: Progressing   Problem: Elimination: Goal: Will not experience complications related to bowel motility Outcome: Progressing   Problem: Pain Managment: Goal: General experience of comfort will improve and/or be controlled Outcome: Progressing   Problem: Safety: Goal: Ability to remain free from injury will improve Outcome: Progressing

## 2023-11-29 NOTE — Progress Notes (Signed)
 \ PROGRESS NOTE    Mckenzie Lawrence  ZOX:[id number] DOB: Jan 02, 1946 DOA: 11/28/2023 PCP: Lyle San, MD   Assessment & Plan:   Principal Problem:   Hypertensive urgency/emergency Active Problems:   COPD with acute exacerbation (HCC)   Acute dyspnea   CAD (coronary artery disease)   Chest pain   Acute renal failure superimposed on stage 4 chronic kidney disease (HCC)   Hyponatremia   Hypertensive emergency  Assessment and Plan: Hypertensive urgency/emergency: resolved emergency but still w/ HTN, poorly controlled. Continue on amlodipine , losartan , & metoprolol . IV hydralazine  prn   Acute diastolic CHF: w/ elevated BNP, CXR showing vascular congestion, pitting LE edema & dyspnea. Holding lasix today as pt received tolvaptan x 1 as per nephro. Monitor I/Os. Echo shows EF 50-55%, grade II diastolic dysfunction, no regional wall motion abnormalities. Cardio consulted    COPD  exacerbation: continue on steroids, bronchodilators & encourage incentive spirometry. Started on tessalon pearles.     Chest pain: w/ hx of CAD. Troponins are minimally elevated. EKG shows no ischemic changes. Continue on metoprolol , losartan , statin, aspirin . Ok for pt to have narcotics today as per pt's daughter   AKI on CKDIV: Cr is trending up again today. Will possibly start HD in 1-2 days as per nephro. Nephro following and recs apprec   Hyperkalemia: elevated again today but repeat K level is WNL    Hyponatremia: w/ hx of SIADH. Labile. S/p tolvaptan x 1 as per nephro. Nephro following and recs apprec   ACD: likely secondary to CKD. No need for a transfusion currently   Acute diarrhea: x 3 days. Resolved as per pt    Esophageal dysphagia: s/p botox  via EGD in 08/2022. Aspiration precautions. Continue w/ supportive care    OSA: CPAP qhs  DM2: well controlled, HbA1c 6.1. Continue on glargine, SSI w/ accuchecks       DVT prophylaxis: lovenox  Code Status: full  Family Communication: discussed pt's  care w/ pt's daughter at bedside and answered her questions  Disposition Plan: depends on PT/OT recs (not consulted yet)  Level of care: Progressive  Status is: Inpatient Remains inpatient appropriate because: severity of illness    Consultants:  Nephro   Procedures:   Antimicrobials:   Subjective: Pt c/o pain and cough   Objective: Vitals:   11/28/23 2347 11/29/23 0452 11/29/23 0500 11/29/23 0732  BP: (!) 154/52 (!) 163/51    Pulse: 78 72    Resp: 19 19    Temp: 97.9 F (36.6 C) 97.9 F (36.6 C)    TempSrc: Oral     SpO2: 97% 98%  94%  Weight:   80.1 kg     Intake/Output Summary (Last 24 hours) at 11/29/2023 0856 Last data filed at 11/28/2023 2124 Gross per 24 hour  Intake 480 ml  Output 400 ml  Net 80 ml   Filed Weights   11/28/23 0555 11/29/23 0500  Weight: 79.4 kg 80.1 kg    Examination:  General exam:appears uncomfortable  Respiratory system: course breath sounds b/l  Cardiovascular system: S1/S2+. No rubs, gallops or clicks.  Gastrointestinal system: abd is soft, NT, ND & hypoactive bowel sounds  Central nervous system: alert & oriented. Moves all extremities. Psychiatry: Judgement and insight appears at baseline. Flat mood and affect      Data Reviewed: I have personally reviewed following labs and imaging studies  CBC: Recent Labs  Lab 11/28/23 0246 11/29/23 0600  WBC 11.1* 14.6*  NEUTROABS 9.7*  --   HGB 9.0*  7.9*  HCT 27.4* 23.6*  MCV 92.3 90.8  PLT 292 253   Basic Metabolic Panel: Recent Labs  Lab 11/28/23 0246 11/28/23 1452 11/28/23 1620 11/29/23 0600  NA 123* 121* 122* 125*  K 5.2* 5.1 5.0 5.6*  CL 94*  --  93* 96*  CO2 19*  --  18* 19*  GLUCOSE 207*  --  324* 282*  BUN 67*  --  66* 73*  CREATININE 3.59*  --  3.77* 3.95*  CALCIUM  8.8*  --  8.5* 8.5*  MG 2.2  --   --   --    GFR: Estimated Creatinine Clearance: 13.2 mL/min (A) (by C-G formula based on SCr of 3.95 mg/dL (H)). Liver Function Tests: Recent Labs  Lab  11/28/23 0246 11/28/23 1620  AST 17 22  ALT 11 11  ALKPHOS 95 88  BILITOT 0.7 0.7  PROT 6.7 6.2*  ALBUMIN 2.9* 2.6*   No results for input(s): LIPASE, AMYLASE in the last 168 hours. No results for input(s): AMMONIA in the last 168 hours. Coagulation Profile: No results for input(s): INR, PROTIME in the last 168 hours. Cardiac Enzymes: No results for input(s): CKTOTAL, CKMB, CKMBINDEX, TROPONINI in the last 168 hours. BNP (last 3 results) No results for input(s): PROBNP in the last 8760 hours. HbA1C: No results for input(s): HGBA1C in the last 72 hours. CBG: Recent Labs  Lab 11/28/23 0830 11/28/23 1235 11/28/23 1559 11/28/23 2049  GLUCAP 235* 305* 327* 397*   Lipid Profile: No results for input(s): CHOL, HDL, LDLCALC, TRIG, CHOLHDL, LDLDIRECT in the last 72 hours. Thyroid Function Tests: No results for input(s): TSH, T4TOTAL, FREET4, T3FREE, THYROIDAB in the last 72 hours. Anemia Panel: No results for input(s): VITAMINB12, FOLATE, FERRITIN, TIBC, IRON, RETICCTPCT in the last 72 hours. Sepsis Labs: No results for input(s): PROCALCITON, LATICACIDVEN in the last 168 hours.  No results found for this or any previous visit (from the past 240 hours).       Radiology Studies: US  Venous Img Lower Bilateral Result Date: 11/28/2023 CLINICAL DATA:  New onset left greater than right lower extremity swelling, without findings of CHF. EXAM: BILATERAL LOWER EXTREMITY VENOUS DOPPLER ULTRASOUND TECHNIQUE: Gray-scale sonography with graded compression, as well as color Doppler and duplex ultrasound were performed to evaluate the lower extremity deep venous systems from the level of the common femoral vein and including the common femoral, femoral, profunda femoral, popliteal and calf veins including the posterior tibial, peroneal and gastrocnemius veins when visible. The superficial great saphenous vein was also interrogated.  Spectral Doppler was utilized to evaluate flow at rest and with distal augmentation maneuvers in the common femoral, femoral and popliteal veins. COMPARISON:  Left lower extremity DVT exam 08/26/2019. FINDINGS: RIGHT LOWER EXTREMITY Common Femoral Vein: No evidence of thrombus. Normal compressibility, respiratory phasicity and response to augmentation. Saphenofemoral Junction: No evidence of thrombus. Normal compressibility and flow on color Doppler imaging. Profunda Femoral Vein: No evidence of thrombus. Normal compressibility and flow on color Doppler imaging. Femoral Vein: No evidence of thrombus. Normal compressibility, respiratory phasicity and response to augmentation. Popliteal Vein: No evidence of thrombus. Normal compressibility, respiratory phasicity and response to augmentation. Calf Veins: No evidence of thrombus. Normal compressibility and flow on color Doppler imaging. Superficial Great Saphenous Vein: No evidence of thrombus. Normal compressibility. Venous Reflux:  None. Other Findings:  Pulsatile venous waveforms. LEFT LOWER EXTREMITY Common Femoral Vein: No evidence of thrombus. Normal compressibility, respiratory phasicity and response to augmentation. Saphenofemoral Junction: No evidence of thrombus. Normal  compressibility and flow on color Doppler imaging. Profunda Femoral Vein: No evidence of thrombus. Normal compressibility and flow on color Doppler imaging. Femoral Vein: No evidence of thrombus. Normal compressibility, respiratory phasicity and response to augmentation. Popliteal Vein: No evidence of thrombus. Normal compressibility, respiratory phasicity and response to augmentation. Calf Veins: No evidence of thrombus. Normal compressibility and flow on color Doppler imaging. Superficial Great Saphenous Vein: No evidence of thrombus. Normal compressibility. Venous Reflux:  None. Other Findings:  Pulsatile venous waveforms. IMPRESSION: 1. No evidence of deep venous thrombosis in either lower  extremity. 2. Pulsatile venous waveforms, which can be seen with right heart failure or tricuspid regurgitation. Electronically Signed   By: Denman Fischer M.D.   On: 11/28/2023 04:56   DG Chest Portable 1 View Result Date: 11/28/2023 CLINICAL DATA:  Difficulty breathing EXAM: PORTABLE CHEST 1 VIEW COMPARISON:  07/19/2023 FINDINGS: Cardiac shadow is mildly enlarged. Aortic calcifications are seen. Mild vascular congestion is noted. Small right-sided pleural effusion is noted. Metallic densities are noted symmetrically over the upper chest likely artifactual in nature. IMPRESSION: Small right pleural effusion and vascular congestion. Electronically Signed   By: Violeta Grey M.D.   On: 11/28/2023 02:59        Scheduled Meds:  amLODipine   10 mg Oral Daily   aspirin  EC  81 mg Oral QHS   diazepam   5 mg Oral Q1500   enoxaparin  (LOVENOX ) injection  30 mg Subcutaneous Q24H   guaiFENesin   600 mg Oral BID   insulin  aspart  0-5 Units Subcutaneous QHS   insulin  aspart  0-9 Units Subcutaneous TID WC   insulin  glargine-yfgn  10 Units Subcutaneous BID   ipratropium-albuterol   3 mL Nebulization Q6H   losartan   100 mg Oral QHS   metoprolol  succinate  100 mg Oral QHS   nicotine   21 mg Transdermal Daily   predniSONE   40 mg Oral Q breakfast   rosuvastatin   5 mg Oral Daily   sodium bicarbonate   650 mg Oral BID   sodium zirconium cyclosilicate  10 g Oral Once   tolvaptan  15 mg Oral Once   Continuous Infusions:   LOS: 1 day       Alphonsus Jeans, MD [facility] Pager [phone]  If 7PM-7AM, please contact night-coverage [email or web address] 11/29/2023, 8:56 AM

## 2023-11-29 NOTE — Progress Notes (Signed)
 [facility]  ROUNDING NOTE   Subjective:  Patient well-known to us  as we follow her for chronic kidney disease stage IV. Patient currently in bed resting. Noted dyspnea with speech. Patient tearful when discussed starting dialysis. Patient is concerned for husband at home with dementia as she is the primary caregiver for him. Daughter at bedside. Plan to trial tolvaptan acknowledged. Counseled on diet. No acute complaints.    Objective:  Vital signs in last 24 hours:  Temp:  [97.7 F (36.5 C)-98.1 F (36.7 C)] 98.1 F (36.7 C) (06/14 1053) Pulse Rate:  [68-87] 81 (06/14 1053) Resp:  [19] 19 (06/14 0452) BP: (154-173)/(51-65) 173/65 (06/14 1053) SpO2:  [94 %-100 %] 96 % (06/14 1053) Weight:  [80.1 kg] 80.1 kg (06/14 0500)  Weight change: 0.7 kg Filed Weights   11/28/23 0555 11/29/23 0500  Weight: 79.4 kg 80.1 kg    Intake/Output: I/O last 3 completed shifts: In: 480 [P.O.:480] Out: 700 [Urine:700]   Intake/Output this shift:  Total I/O In: 240 [P.O.:240] Out: -   Physical Exam: General: NAD,   Head: Normocephalic, atraumatic. Moist oral mucosal membranes  Eyes: Anicteric, PERRL  Neck: Supple, trachea midline  Lungs:  Clear to auscultation  Heart: Regular rate and rhythm  Abdomen:  Soft, nontender,   Extremities:  Trace peripheral edema.  Neurologic: Nonfocal, moving all four extremities  Skin: No lesions  Access: None    Basic Metabolic Panel: Recent Labs  Lab 11/28/23 0246 11/28/23 1452 11/28/23 1620 11/29/23 0600  NA 123* 121* 122* 125*  K 5.2* 5.1 5.0 5.6*  CL 94*  --  93* 96*  CO2 19*  --  18* 19*  GLUCOSE 207*  --  324* 282*  BUN 67*  --  66* 73*  CREATININE 3.59*  --  3.77* 3.95*  CALCIUM  8.8*  --  8.5* 8.5*  MG 2.2  --   --   --     Liver Function Tests: Recent Labs  Lab 11/28/23 0246 11/28/23 1620  AST 17 22  ALT 11 11  ALKPHOS 95 88  BILITOT 0.7 0.7  PROT 6.7 6.2*  ALBUMIN 2.9* 2.6*   No results for input(s):  LIPASE, AMYLASE in the last 168 hours. No results for input(s): AMMONIA in the last 168 hours.  CBC: Recent Labs  Lab 11/28/23 0246 11/29/23 0600  WBC 11.1* 14.6*  NEUTROABS 9.7*  --   HGB 9.0* 7.9*  HCT 27.4* 23.6*  MCV 92.3 90.8  PLT 292 253    Cardiac Enzymes: No results for input(s): CKTOTAL, CKMB, CKMBINDEX, TROPONINI in the last 168 hours.  BNP: Invalid input(s): POCBNP  CBG: Recent Labs  Lab 11/28/23 1235 11/28/23 1559 11/28/23 2049 11/29/23 0906 11/29/23 1234  GLUCAP 305* 327* 397* 263* 189*    Microbiology: Results for orders placed or performed during the hospital encounter of 08/28/23  Blood culture (routine single)     Status: None   Collection Time: 08/28/23 12:40 PM   Specimen: BLOOD  Result Value Ref Range Status   Specimen Description BLOOD RIGHT ANTECUBITAL  Final   Special Requests   Final    BOTTLES DRAWN AEROBIC AND ANAEROBIC Blood Culture adequate volume   Culture   Final    NO GROWTH 5 DAYS Performed at [facility], [address] [place], [place] [address]    Report Status 09/02/2023 FINAL  Final  Blood culture (routine single)     Status: None   Collection Time: 08/28/23 12:40 PM  Specimen: BLOOD  Result Value Ref Range Status   Specimen Description BLOOD LEFT ANTECUBITAL  Final   Special Requests   Final    BOTTLES DRAWN AEROBIC AND ANAEROBIC Blood Culture adequate volume   Culture   Final    NO GROWTH 5 DAYS Performed at [facility], [address] [place], [place] [address]    Report Status 09/02/2023 FINAL  Final    Coagulation Studies: No results for input(s): LABPROT, INR in the last 72 hours.  Urinalysis: No results for input(s): COLORURINE, LABSPEC, PHURINE, GLUCOSEU, HGBUR, BILIRUBINUR, KETONESUR, PROTEINUR, UROBILINOGEN, NITRITE, LEUKOCYTESUR in the last 72 hours.  Invalid input(s): APPERANCEUR    Imaging: ECHOCARDIOGRAM COMPLETE Result Date:  11/29/2023    ECHOCARDIOGRAM REPORT   Patient Name:   NATASHIA ROSEMAN Date of Exam: 11/29/2023 Medical Rec #:  [id number]      Height:       67.0 in Accession #:    [id number]     Weight:       176.6 lb Date of Birth:  May 04, 1946      BSA:          1.918 m Patient Age:    78 years       BP:           163/51 mmHg Patient Gender: F              HR:           81 bpm. Exam Location:  [facility] Procedure: 2D Echo, Cardiac Doppler and Color Doppler (Both Spectral and Color            Flow Doppler were utilized during procedure). Indications:     CHF I50.31  History:         Patient has prior history of Echocardiogram examinations, most                  recent 07/23/2023.  Sonographer:     Clenton Czech RDCS, FASE Referring Phys:  [id number] Alphonsus Jeans Diagnosing Phys: Dwayne D Callwood MD IMPRESSIONS  1. Left ventricular ejection fraction, by estimation, is 55 to 60%. The left ventricle has normal function. The left ventricle has no regional wall motion abnormalities. The left ventricular internal cavity size was moderately dilated. Left ventricular diastolic parameters are consistent with Grade II diastolic dysfunction (pseudonormalization).  2. Right ventricular systolic function is normal. The right ventricular size is normal.  3. Left atrial size was mildly dilated.  4. The mitral valve is normal in structure. Trivial mitral valve regurgitation.  5. The aortic valve is normal in structure. Aortic valve regurgitation is not visualized. FINDINGS  Left Ventricle: Left ventricular ejection fraction, by estimation, is 55 to 60%. The left ventricle has normal function. The left ventricle has no regional wall motion abnormalities. Strain was performed and the global longitudinal strain is indeterminate. The left ventricular internal cavity size was moderately dilated. There is no left ventricular hypertrophy. Left ventricular diastolic parameters are consistent with Grade II diastolic dysfunction (pseudonormalization). Right  Ventricle: The right ventricular size is normal. No increase in right ventricular wall thickness. Right ventricular systolic function is normal. Left Atrium: Left atrial size was mildly dilated. Right Atrium: Right atrial size was normal in size. Pericardium: There is no evidence of pericardial effusion. Mitral Valve: The mitral valve is normal in structure. Trivial mitral valve regurgitation. Tricuspid Valve: The tricuspid valve is normal in structure. Tricuspid valve regurgitation is mild. Aortic Valve: The aortic valve is  normal in structure. Aortic valve regurgitation is not visualized. Aortic valve peak gradient measures 8.8 mmHg. Pulmonic Valve: The pulmonic valve was normal in structure. Pulmonic valve regurgitation is not visualized. Aorta: The ascending aorta was not well visualized. IAS/Shunts: No atrial level shunt detected by color flow Doppler. Additional Comments: 3D was performed not requiring image post processing on an independent workstation and was indeterminate.  LEFT VENTRICLE PLAX 2D LVIDd:         5.50 cm     Diastology LVIDs:         3.80 cm     LV e' medial:    7.18 cm/s LV PW:         1.00 cm     LV E/e' medial:  19.9 LV IVS:        1.10 cm     LV e' lateral:   8.38 cm/s LVOT diam:     1.80 cm     LV E/e' lateral: 17.1 LV SV:         63 LV SV Index:   33 LVOT Area:     2.54 cm  LV Volumes (MOD) LV vol d, MOD A2C: 74.1 ml LV vol d, MOD A4C: 70.9 ml LV vol s, MOD A2C: 37.0 ml LV vol s, MOD A4C: 28.8 ml LV SV MOD A2C:     37.1 ml LV SV MOD A4C:     70.9 ml LV SV MOD BP:      41.0 ml RIGHT VENTRICLE RV Basal diam:  3.20 cm RV S prime:     14.80 cm/s TAPSE (M-mode): 2.6 cm LEFT ATRIUM             Index        RIGHT ATRIUM           Index LA diam:        4.30 cm 2.24 cm/m   RA Area:     19.70 cm LA Vol (A2C):   44.9 ml 23.41 ml/m  RA Volume:   55.40 ml  28.88 ml/m LA Vol (A4C):   53.8 ml 28.05 ml/m LA Biplane Vol: 51.9 ml 27.06 ml/m  AORTIC VALVE                 PULMONIC VALVE AV Area  (Vmax): 1.84 cm     PV Vmax:        1.01 m/s AV Vmax:        148.00 cm/s  PV Peak grad:   4.1 mmHg AV Peak Grad:   8.8 mmHg     RVOT Peak grad: 3 mmHg LVOT Vmax:      107.00 cm/s LVOT Vmean:     76.000 cm/s LVOT VTI:       0.249 m  AORTA Ao Root diam: 2.90 cm MITRAL VALVE MV Area (PHT): 4.86 cm     SHUNTS MV Decel Time: 156 msec     Systemic VTI:  0.25 m MV E velocity: 143.00 cm/s  Systemic Diam: 1.80 cm MV A velocity: 80.30 cm/s MV E/A ratio:  1.78 Dwayne D Callwood MD Electronically signed by Antonette Batters MD Signature Date/Time: 11/29/2023/10:33:04 AM    Final    US  Venous Img Lower Bilateral Result Date: 11/28/2023 CLINICAL DATA:  New onset left greater than right lower extremity swelling, without findings of CHF. EXAM: BILATERAL LOWER EXTREMITY VENOUS DOPPLER ULTRASOUND TECHNIQUE: Gray-scale sonography with graded compression, as well as color Doppler and duplex ultrasound were performed to evaluate the  lower extremity deep venous systems from the level of the common femoral vein and including the common femoral, femoral, profunda femoral, popliteal and calf veins including the posterior tibial, peroneal and gastrocnemius veins when visible. The superficial great saphenous vein was also interrogated. Spectral Doppler was utilized to evaluate flow at rest and with distal augmentation maneuvers in the common femoral, femoral and popliteal veins. COMPARISON:  Left lower extremity DVT exam 08/26/2019. FINDINGS: RIGHT LOWER EXTREMITY Common Femoral Vein: No evidence of thrombus. Normal compressibility, respiratory phasicity and response to augmentation. Saphenofemoral Junction: No evidence of thrombus. Normal compressibility and flow on color Doppler imaging. Profunda Femoral Vein: No evidence of thrombus. Normal compressibility and flow on color Doppler imaging. Femoral Vein: No evidence of thrombus. Normal compressibility, respiratory phasicity and response to augmentation. Popliteal Vein: No evidence of  thrombus. Normal compressibility, respiratory phasicity and response to augmentation. Calf Veins: No evidence of thrombus. Normal compressibility and flow on color Doppler imaging. Superficial Great Saphenous Vein: No evidence of thrombus. Normal compressibility. Venous Reflux:  None. Other Findings:  Pulsatile venous waveforms. LEFT LOWER EXTREMITY Common Femoral Vein: No evidence of thrombus. Normal compressibility, respiratory phasicity and response to augmentation. Saphenofemoral Junction: No evidence of thrombus. Normal compressibility and flow on color Doppler imaging. Profunda Femoral Vein: No evidence of thrombus. Normal compressibility and flow on color Doppler imaging. Femoral Vein: No evidence of thrombus. Normal compressibility, respiratory phasicity and response to augmentation. Popliteal Vein: No evidence of thrombus. Normal compressibility, respiratory phasicity and response to augmentation. Calf Veins: No evidence of thrombus. Normal compressibility and flow on color Doppler imaging. Superficial Great Saphenous Vein: No evidence of thrombus. Normal compressibility. Venous Reflux:  None. Other Findings:  Pulsatile venous waveforms. IMPRESSION: 1. No evidence of deep venous thrombosis in either lower extremity. 2. Pulsatile venous waveforms, which can be seen with right heart failure or tricuspid regurgitation. Electronically Signed   By: Denman Fischer M.D.   On: 11/28/2023 04:56   DG Chest Portable 1 View Result Date: 11/28/2023 CLINICAL DATA:  Difficulty breathing EXAM: PORTABLE CHEST 1 VIEW COMPARISON:  07/19/2023 FINDINGS: Cardiac shadow is mildly enlarged. Aortic calcifications are seen. Mild vascular congestion is noted. Small right-sided pleural effusion is noted. Metallic densities are noted symmetrically over the upper chest likely artifactual in nature. IMPRESSION: Small right pleural effusion and vascular congestion. Electronically Signed   By: Violeta Grey M.D.   On: 11/28/2023 02:59      Medications:     amLODipine   10 mg Oral Daily   aspirin  EC  81 mg Oral QHS   benzonatate  200 mg Oral TID   diazepam   5 mg Oral Q1500   enoxaparin  (LOVENOX ) injection  30 mg Subcutaneous Q24H   guaiFENesin   600 mg Oral BID   insulin  aspart  0-5 Units Subcutaneous QHS   insulin  aspart  0-9 Units Subcutaneous TID WC   insulin  glargine-yfgn  10 Units Subcutaneous BID   losartan   100 mg Oral QHS   metoprolol  succinate  100 mg Oral QHS   nicotine   21 mg Transdermal Daily   predniSONE   40 mg Oral Q breakfast   rosuvastatin   5 mg Oral Daily   sodium bicarbonate   650 mg Oral BID   acetaminophen  **OR** acetaminophen , albuterol , guaiFENesin -dextromethorphan, hydrALAZINE , ondansetron  **OR** ondansetron  (ZOFRAN ) IV, oxyCODONE -acetaminophen , oxyCODONE -acetaminophen , sucralfate , zolpidem   Assessment/ Plan:  Ms. AAHANA ELZA is a 78 y.o.  female with past medical history of hypertension, long-standing diabetes mellitus type 2, lower extremity edema, hyperlipidemia, tobacco abuse, prior  history of CVA, COPD, obstructive sleep apnea, lumbar spinal fusion, left upper lobe lung mass who presents with volume overload and increasing shortness of breath.   1.  Acute kidney injury/chronic kidney disease stage IV/diabetes mellitus type 2 with chronic kidney disease/proteinuria.  Patient with worsening renal function.  eGFR down to 13. Suspect that diuretics could worsen renal function as she is volume overloaded now.  Case discussed with hospitalist. Lasix discontinued yesterday and trial the patient on tolvaptan today. Discussed with patient and daughter of possible renal replacement therapy for Monday pending on effectiveness of tolvaptan.   2.  Hyponatremia.  Likely secondary to volume overload.  Na improved to 125 today. Administer tolvaptan 15 mg x 1 today   3.  Volume overload.  Repeat 2D echocardiogram pending at the moment.  BNP elevated at 2450.   4.  Anemia of chronic kidney disease.   Hemoglobin 7.9 Consider ESA. Consider transfusion Hgb<7    LOS: 1 Chrys Landgrebe Marisa Sickles 6/14/20251:10 PM

## 2023-11-29 NOTE — Consult Note (Signed)
 CARDIOLOGY CONSULT NOTE               Patient ID: Mckenzie Lawrence MRN: [id number] DOB/AGE: [birth year] 78 y.o.  Admit date: 11/28/2023 Referring Physician Dr. Brion Cancel hospitalist Primary Physician Dr. Lyle San primary Primary Cardiologist Dr. Scarlet Curly Reason for Consultation diastolic congestive heart failure shortness of breath  HPI: 78 year old female smoker with COPD previous CVA stage IV renal insufficiency history of SIADH esophageal dysphagia diabetes hypertension hyperlipidemia previous history of MRSA bacteremia sepsis multifocal pneumonia few months ago patient presented with worsening shortness of breath dyspnea patient has had worsening renal insufficiency stage IV being followed by nephrology.  Patient had elevated BNP above 2500 persistent shortness of breath with significant COPD with shortness of breath cough congestion as well as concurrent diastolic heart failure denies any chest pain but cardiology was consulted for further evaluation  Review of systems complete and found to be negative unless listed above     Past Medical History:  Diagnosis Date   Anemia    Arthritis    Bladder incontinence    Broken foot    Cataracts, bilateral    Chronic kidney insufficiency    stage 3b   COPD (chronic obstructive pulmonary disease) (HCC)    wheezing   Coronary artery disease 04/25/2022   in [place]   COVID 2021   very mild case   CVA (cerebral vascular accident) (HCC) 2016   has had 3 strokes, states right side is slightlyweaker than left   Diabetes mellitus    insulin  dependent, Type 2   GERD (gastroesophageal reflux disease)    HLD (hyperlipidemia)    Hx of cardiovascular stress test    a. ETT (6/13):  Ex 5:13; no ischemic changes   Hypertension    controlled on meds   Lacunar stroke of left subthalamic region [facility]) 02/2015   Leg pain    left   Lower back pain    Neuromuscular disorder (HCC)    stroke right hand tingling   Orthostatic  hypotension    Osteopenia 01/2017   T score -2.0 stable from prior DEXA   Pancreatitis 10/2021   PCOS (polycystic ovarian syndrome)    Personal history of tobacco use, presenting hazards to health 01/09/2015   PONV (postoperative nausea and vomiting)    Sleep apnea    uses CPAP nightly    Past Surgical History:  Procedure Laterality Date   BIOPSY  08/28/2022   Procedure: BIOPSY;  Surgeon: Felecia Hopper, MD;  Location: [facility];  Service: Gastroenterology;;   BOTOX  INJECTION  01/15/2022   Procedure: BOTOX  INJECTION;  Surgeon: Ozell Blunt, MD;  Location: [facility];  Service: Gastroenterology;;   BOTOX  INJECTION N/A 08/28/2022   Procedure: BOTOX  INJECTION;  Surgeon: Felecia Hopper, MD;  Location: [facility];  Service: Gastroenterology;  Laterality: N/A;   BOTOX  INJECTION Bilateral 05/27/2023   Procedure: BOTOX  INJECTION;  Surgeon: Ozell Blunt, MD;  Location: [facility];  Service: Gastroenterology;  Laterality: Bilateral;   BROW LIFT Bilateral 11/04/2017   Procedure: BLEPHAROPLASTY UPPER EYELID W/EXCESS SKIN;  Surgeon: Zacarias Hermann, MD;  Location: [facility];  Service: Ophthalmology;  Laterality: Bilateral;  DIABETES-insulin  dependent uses CPAP   CARDIAC CATHETERIZATION  20 yrs ago   found nothing   CARPAL TUNNEL RELEASE Bilateral    CATARACT EXTRACTION Bilateral    ELBOW SURGERY Bilateral    ESOPHAGOGASTRODUODENOSCOPY N/A 07/25/2021   Procedure: ESOPHAGOGASTRODUODENOSCOPY (EGD);  Surgeon: Toledo, Alphonsus Jeans, MD;  Location: [facility];  Service: Gastroenterology;  Laterality: N/A;  IDDM   ESOPHAGOGASTRODUODENOSCOPY N/A 01/15/2022   Procedure: ESOPHAGOGASTRODUODENOSCOPY (EGD);  Surgeon: Ozell Blunt, MD;  Location: [facility];  Service: Gastroenterology;  Laterality: N/A;  botox    ESOPHAGOGASTRODUODENOSCOPY (EGD) WITH PROPOFOL  N/A 08/28/2022   Procedure: ESOPHAGOGASTRODUODENOSCOPY (EGD) WITH PROPOFOL ;  Surgeon: Felecia Hopper, MD;  Location: [facility];   Service: Gastroenterology;  Laterality: N/A;   ESOPHAGOGASTRODUODENOSCOPY (EGD) WITH PROPOFOL  Bilateral 05/27/2023   Procedure: ESOPHAGOGASTRODUODENOSCOPY (EGD) WITH PROPOFOL ;  Surgeon: Ozell Blunt, MD;  Location: [facility];  Service: Gastroenterology;  Laterality: Bilateral;   FOOT SURGERY     Groin Abscess     HAND SURGERY     KNEE SURGERY Bilateral    LABIAL ABSCESS     LUMBAR LAMINECTOMY/DECOMPRESSION MICRODISCECTOMY Left 05/11/2018   Procedure: LUMBAR LAMINECTOMY/DECOMPRESSION MICRODISCECTOMY 1 LEVEL- L4-5;  Surgeon: Berta Brittle, MD;  Location: [facility];  Service: Neurosurgery;  Laterality: Left;   LUMBAR LAMINECTOMY/DECOMPRESSION MICRODISCECTOMY Left 05/20/2022   Procedure: MICRODISCECTOMY L3-4;  Surgeon: Garry Kansas, MD;  Location[facility];  Service: Neurosurgery;  Laterality: Left;  3C   LUMBAR WOUND DEBRIDEMENT N/A 08/08/2022   Procedure: INCISION AND DRAINAGE OF LUMBAR WOUND;  Surgeon: Garry Kansas, MD;  Location: [facility] OR;  Service: Neurosurgery;  Laterality: N/A;  3C   OOPHORECTOMY     BSO   PUBO VAG SLING     SHOULDER SURGERY     bilateral arthroscopies   TEE WITHOUT CARDIOVERSION N/A 07/25/2023   Procedure: TRANSESOPHAGEAL ECHOCARDIOGRAM (TEE);  Surgeon: Dorita Garter, MD;  Location: [facility];  Service: Cardiovascular;  Laterality: N/A;   VAGINAL HYSTERECTOMY  1979    Medications Prior to Admission  Medication Sig Dispense Refill Last Dose/Taking   acetaminophen  (TYLENOL ) 500 MG tablet Take 1,000 mg by mouth every 6 (six) hours as needed for moderate pain.   Unknown   albuterol  (VENTOLIN  HFA) 108 (90 Base) MCG/ACT inhaler Inhale 2 puffs into the lungs every 6 (six) hours as needed for wheezing or shortness of breath.   Unknown   aspirin  EC 81 MG tablet Take 81 mg by mouth at bedtime. Swallow whole.   Taking   calcitRIOL  (ROCALTROL ) 0.25 MCG capsule Take 0.25 mcg by mouth at bedtime.   Taking   cholecalciferol (VITAMIN D3) 25 MCG (1000 UNIT) tablet Take 1,000 Units by  mouth daily.   Taking   cyclobenzaprine  (FLEXERIL ) 10 MG tablet Take 1 tablet (10 mg total) by mouth 3 (three) times daily as needed for muscle spasms. 30 tablet 0 Taking As Needed   D-MANNOSE PO Take 2 tablets by mouth at bedtime.   Taking   diazepam  (VALIUM ) 5 MG tablet Take 5 mg by mouth 2 (two) times daily as needed.   Taking As Needed   diphenhydrAMINE (BENADRYL) 25 MG tablet Take 50 mg by mouth at bedtime as needed for sleep.   Taking As Needed   docusate sodium  (COLACE) 100 MG capsule Take 1 capsule (100 mg total) by mouth 2 (two) times daily. (Patient taking differently: Take 100 mg by mouth at bedtime as needed for mild constipation or moderate constipation.) 30 capsule 0 Taking Differently   estradiol  (ESTRACE ) 0.1 MG/GM vaginal cream Place 1 Applicatorful vaginally 3 (three) times a week.   Taking   Fe Fum-Vit C-Vit B12-FA (TRIGELS-F FORTE) CAPS capsule Take 1 capsule by mouth daily after breakfast. 90 capsule 0 Taking   insulin  glargine-yfgn (SEMGLEE ) 100 UNIT/ML Pen Inject 10 Units into the skin 2 (two) times daily.   Taking   losartan  (COZAAR ) 100  MG tablet Take 1 tablet (100 mg total) by mouth daily. (Patient taking differently: Take 100 mg by mouth at bedtime.) 30 tablet 2 Taking Differently   metoprolol  succinate (TOPROL -XL) 100 MG 24 hr tablet Take 100 mg by mouth at bedtime.   Taking   Multiple Vitamins-Minerals (PRESERVISION AREDS 2+MULTI VIT PO) Take 1 capsule by mouth daily.   Taking   nitrofurantoin  (MACRODANTIN ) 100 MG capsule Take 100 mg by mouth at bedtime.   Taking   rosuvastatin  (CRESTOR ) 5 MG tablet Take 5 mg by mouth daily.   Taking   sennosides-docusate sodium  (SENOKOT-S) 8.6-50 MG tablet Take 1 tablet by mouth daily as needed for constipation.   Taking As Needed   sodium bicarbonate  650 MG tablet Take 1 tablet (650 mg total) by mouth 2 (two) times daily. (Patient taking differently: Take 650 mg by mouth daily.) 100 tablet 1 Taking Differently   sucralfate  (CARAFATE ) 1  g tablet Take 1 tablet (1 g total) by mouth 2 (two) times daily as needed (acid reflux).   Taking As Needed   Vibegron (GEMTESA) 75 MG TABS Take 75 mg by mouth at bedtime.   Taking   zolpidem  (AMBIEN ) 10 MG tablet Take 10 mg by mouth at bedtime as needed for sleep.   Taking As Needed   amLODipine  (NORVASC ) 10 MG tablet Take 1 tablet (10 mg total) by mouth daily. (Patient not taking: Reported on 11/28/2023) 30 tablet 2 Not Taking   BD INSULIN  SYRINGE U/F 31G X 5/16 1 ML MISC USE 1 SYRINGE AS DIRECTED      Continuous Glucose Sensor (FREESTYLE LIBRE 2 SENSOR) MISC       epoetin  alfa-epbx (RETACRIT ) 4000 UNIT/ML injection Inject 1 mL (4,000 Units total) into the skin once a week. (Patient not taking: Reported on 11/28/2023) 4 mL 3 Not Taking   feeding supplement (BOOST HIGH PROTEIN) LIQD Take 1 Container by mouth 2 (two) times daily between meals.      insulin  detemir (LEVEMIR ) 100 UNIT/ML injection Inject 100-120 Units into the skin daily. (Patient not taking: Reported on 11/28/2023)   Not Taking   nystatin  (MYCOSTATIN ) 100000 UNIT/ML suspension Take 5 mLs (500,000 Units total) by mouth 4 (four) times daily. (Patient not taking: Reported on 11/28/2023) 120 mL 1 Not Taking   ONETOUCH ULTRA test strip 4 (four) times daily.      Social History   Socioeconomic History   Marital status: Married    Spouse name: Not on file   Number of children: 1   Years of education: Not on file   Highest education level: Not on file  Occupational History   Not on file  Tobacco Use   Smoking status: Every Day    Current packs/day: 1.50    Average packs/day: 1.5 packs/day for 50.0 years (75.0 ttl pk-yrs)    Types: Cigarettes   Smokeless tobacco: Never   Tobacco comments:    3ppd x 10 year, then cut back to 1.5pdd since 02/2015  Vaping Use   Vaping status: Never Used  Substance and Sexual Activity   Alcohol use: Never    Alcohol/week: 0.0 standard drinks of alcohol   Drug use: No   Sexual activity: Not  Currently    Birth control/protection: Surgical    Comment: Hx Hysterectomy, 1st intercourse 78 yo-Fewer than 5 partners  Other Topics Concern   Not on file  Social History Narrative   Lives at home with husband in a one story home.  Has 1 daughter.  Retired.  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     Education: some college.   Social Drivers of Corporate investment banker Strain: Low Risk  (10/31/2023)   Received from [facility]   Overall Financial Resource Strain (CARDIA)    Difficulty of Paying Living Expenses: Not hard at all  Food Insecurity: No Food Insecurity (11/28/2023)   Hunger Vital Sign    Worried About Running Out of Food in the Last Year: Never true    Ran Out of Food in the Last Year: Never true  Transportation Needs: No Transportation Needs (11/28/2023)   PRAPARE - Administrator, Civil Service (Medical): No    Lack of Transportation (Non-Medical): No  Physical Activity: Not on file  Stress: Not on file  Social Connections: Unknown (11/28/2023)   Social Connection and Isolation Panel    Frequency of Communication with Friends and Family: More than three times a week    Frequency of Social Gatherings with Friends and Family: More than three times a week    Attends Religious Services: More than 4 times per year    Active Member of [employer] or Organizations: Not on file    Attends Banker Meetings: Not on file    Marital Status: Not on file  Intimate Partner Violence: Not At Risk (11/28/2023)   Humiliation, Afraid, Rape, and Kick questionnaire    Fear of Current or Ex-Partner: No    Emotionally Abused: No    Physically Abused: No    Sexually Abused: No    Family History  Problem Relation Age of Onset   Hypertension Mother    Heart disease Mother 59       MI   Diabetes Father    Diabetes Sister    Hypertension Sister    Diabetes Brother    Hypertension Brother    Heart disease Brother 39       CAD   Cancer  Sister        Multiple myloma   Diabetes Brother       Review of systems complete and found to be negative unless listed above      PHYSICAL EXAM  General: Well developed, well nourished, in no acute distress HEENT:  Normocephalic and atramatic Neck:  No JVD.  Lungs: Clear bilaterally to auscultation and percussion. Heart: HRRR . Normal S1 and S2 without gallops or murmurs.  Abdomen: Bowel sounds are positive, abdomen soft and non-tender  Msk:  Back normal, normal gait. Normal strength and tone for age. Extremities: No clubbing, cyanosis or edema.   Neuro: Alert and oriented X 3. Psych:  Good affect, responds appropriately  Labs:   Lab Results  Component Value Date   WBC 14.6 (H) 11/29/2023   HGB 7.9 (L) 11/29/2023   HCT 23.6 (L) 11/29/2023   MCV 90.8 11/29/2023   PLT 253 11/29/2023    Recent Labs  Lab 11/28/23 1620 11/29/23 0600  NA 122* 125*  K 5.0 5.6*  CL 93* 96*  CO2 18* 19*  BUN 66* 73*  CREATININE 3.77* 3.95*  CALCIUM  8.5* 8.5*  PROT 6.2*  --   BILITOT 0.7  --   ALKPHOS 88  --   ALT 11  --   AST 22  --   GLUCOSE 324* 282*   Lab Results  Component Value Date   CKTOTAL 43 08/10/2022   TROPONINI <0.03 03/15/2015    Lab Results  Component Value Date   CHOL  146 03/16/2015   Lab Results  Component Value Date   HDL 34 (L) 03/16/2015   Lab Results  Component Value Date   LDLCALC 66 03/16/2015   Lab Results  Component Value Date   TRIG 122 11/10/2021   TRIG 229 (H) 03/16/2015   Lab Results  Component Value Date   CHOLHDL 4.3 03/16/2015   No results found for: LDLDIRECT    Radiology: ECHOCARDIOGRAM COMPLETE Result Date: 11/29/2023    ECHOCARDIOGRAM REPORT   Patient Name:   SONNY POTH Date of Exam: 11/29/2023 Medical Rec #:  [id number]      Height:       67.0 in Accession #:    [id number]     Weight:       176.6 lb Date of Birth:  11-16-1945      BSA:          1.918 m Patient Age:    78 years       BP:           163/51 mmHg Patient  Gender: F              HR:           81 bpm. Exam Location:  [facility] Procedure: 2D Echo, Cardiac Doppler and Color Doppler (Both Spectral and Color            Flow Doppler were utilized during procedure). Indications:     CHF I50.31  History:         Patient has prior history of Echocardiogram examinations, most                  recent 07/23/2023.  Sonographer:     Clenton Czech RDCS, FASE Referring Phys:  [id number] Alphonsus Jeans Diagnosing Phys: Josanna Hefel D Linwood Gullikson MD IMPRESSIONS  1. Left ventricular ejection fraction, by estimation, is 55 to 60%. The left ventricle has normal function. The left ventricle has no regional wall motion abnormalities. The left ventricular internal cavity size was moderately dilated. Left ventricular diastolic parameters are consistent with Grade II diastolic dysfunction (pseudonormalization).  2. Right ventricular systolic function is normal. The right ventricular size is normal.  3. Left atrial size was mildly dilated.  4. The mitral valve is normal in structure. Trivial mitral valve regurgitation.  5. The aortic valve is normal in structure. Aortic valve regurgitation is not visualized. FINDINGS  Left Ventricle: Left ventricular ejection fraction, by estimation, is 55 to 60%. The left ventricle has normal function. The left ventricle has no regional wall motion abnormalities. Strain was performed and the global longitudinal strain is indeterminate. The left ventricular internal cavity size was moderately dilated. There is no left ventricular hypertrophy. Left ventricular diastolic parameters are consistent with Grade II diastolic dysfunction (pseudonormalization). Right Ventricle: The right ventricular size is normal. No increase in right ventricular wall thickness. Right ventricular systolic function is normal. Left Atrium: Left atrial size was mildly dilated. Right Atrium: Right atrial size was normal in size. Pericardium: There is no evidence of pericardial effusion. Mitral Valve:  The mitral valve is normal in structure. Trivial mitral valve regurgitation. Tricuspid Valve: The tricuspid valve is normal in structure. Tricuspid valve regurgitation is mild. Aortic Valve: The aortic valve is normal in structure. Aortic valve regurgitation is not visualized. Aortic valve peak gradient measures 8.8 mmHg. Pulmonic Valve: The pulmonic valve was normal in structure. Pulmonic valve regurgitation is not visualized. Aorta: The ascending aorta was not well visualized. IAS/Shunts: No atrial level shunt  detected by color flow Doppler. Additional Comments: 3D was performed not requiring image post processing on an independent workstation and was indeterminate.  LEFT VENTRICLE PLAX 2D LVIDd:         5.50 cm     Diastology LVIDs:         3.80 cm     LV e' medial:    7.18 cm/s LV PW:         1.00 cm     LV E/e' medial:  19.9 LV IVS:        1.10 cm     LV e' lateral:   8.38 cm/s LVOT diam:     1.80 cm     LV E/e' lateral: 17.1 LV SV:         63 LV SV Index:   33 LVOT Area:     2.54 cm  LV Volumes (MOD) LV vol d, MOD A2C: 74.1 ml LV vol d, MOD A4C: 70.9 ml LV vol s, MOD A2C: 37.0 ml LV vol s, MOD A4C: 28.8 ml LV SV MOD A2C:     37.1 ml LV SV MOD A4C:     70.9 ml LV SV MOD BP:      41.0 ml RIGHT VENTRICLE RV Basal diam:  3.20 cm RV S prime:     14.80 cm/s TAPSE (M-mode): 2.6 cm LEFT ATRIUM             Index        RIGHT ATRIUM           Index LA diam:        4.30 cm 2.24 cm/m   RA Area:     19.70 cm LA Vol (A2C):   44.9 ml 23.41 ml/m  RA Volume:   55.40 ml  28.88 ml/m LA Vol (A4C):   53.8 ml 28.05 ml/m LA Biplane Vol: 51.9 ml 27.06 ml/m  AORTIC VALVE                 PULMONIC VALVE AV Area (Vmax): 1.84 cm     PV Vmax:        1.01 m/s AV Vmax:        148.00 cm/s  PV Peak grad:   4.1 mmHg AV Peak Grad:   8.8 mmHg     RVOT Peak grad: 3 mmHg LVOT Vmax:      107.00 cm/s LVOT Vmean:     76.000 cm/s LVOT VTI:       0.249 m  AORTA Ao Root diam: 2.90 cm MITRAL VALVE MV Area (PHT): 4.86 cm     SHUNTS MV Decel Time:  156 msec     Systemic VTI:  0.25 m MV E velocity: 143.00 cm/s  Systemic Diam: 1.80 cm MV A velocity: 80.30 cm/s MV E/A ratio:  1.78 Ammy Lienhard D Jeniya Flannigan MD Electronically signed by Antonette Batters MD Signature Date/Time: 11/29/2023/10:33:04 AM    Final    US  Venous Img Lower Bilateral Result Date: 11/28/2023 CLINICAL DATA:  New onset left greater than right lower extremity swelling, without findings of CHF. EXAM: BILATERAL LOWER EXTREMITY VENOUS DOPPLER ULTRASOUND TECHNIQUE: Gray-scale sonography with graded compression, as well as color Doppler and duplex ultrasound were performed to evaluate the lower extremity deep venous systems from the level of the common femoral vein and including the common femoral, femoral, profunda femoral, popliteal and calf veins including the posterior tibial, peroneal and gastrocnemius veins when visible. The superficial great saphenous vein was also interrogated. Spectral  Doppler was utilized to evaluate flow at rest and with distal augmentation maneuvers in the common femoral, femoral and popliteal veins. COMPARISON:  Left lower extremity DVT exam 08/26/2019. FINDINGS: RIGHT LOWER EXTREMITY Common Femoral Vein: No evidence of thrombus. Normal compressibility, respiratory phasicity and response to augmentation. Saphenofemoral Junction: No evidence of thrombus. Normal compressibility and flow on color Doppler imaging. Profunda Femoral Vein: No evidence of thrombus. Normal compressibility and flow on color Doppler imaging. Femoral Vein: No evidence of thrombus. Normal compressibility, respiratory phasicity and response to augmentation. Popliteal Vein: No evidence of thrombus. Normal compressibility, respiratory phasicity and response to augmentation. Calf Veins: No evidence of thrombus. Normal compressibility and flow on color Doppler imaging. Superficial Great Saphenous Vein: No evidence of thrombus. Normal compressibility. Venous Reflux:  None. Other Findings:  Pulsatile venous  waveforms. LEFT LOWER EXTREMITY Common Femoral Vein: No evidence of thrombus. Normal compressibility, respiratory phasicity and response to augmentation. Saphenofemoral Junction: No evidence of thrombus. Normal compressibility and flow on color Doppler imaging. Profunda Femoral Vein: No evidence of thrombus. Normal compressibility and flow on color Doppler imaging. Femoral Vein: No evidence of thrombus. Normal compressibility, respiratory phasicity and response to augmentation. Popliteal Vein: No evidence of thrombus. Normal compressibility, respiratory phasicity and response to augmentation. Calf Veins: No evidence of thrombus. Normal compressibility and flow on color Doppler imaging. Superficial Great Saphenous Vein: No evidence of thrombus. Normal compressibility. Venous Reflux:  None. Other Findings:  Pulsatile venous waveforms. IMPRESSION: 1. No evidence of deep venous thrombosis in either lower extremity. 2. Pulsatile venous waveforms, which can be seen with right heart failure or tricuspid regurgitation. Electronically Signed   By: Denman Fischer M.D.   On: 11/28/2023 04:56   DG Chest Portable 1 View Result Date: 11/28/2023 CLINICAL DATA:  Difficulty breathing EXAM: PORTABLE CHEST 1 VIEW COMPARISON:  07/19/2023 FINDINGS: Cardiac shadow is mildly enlarged. Aortic calcifications are seen. Mild vascular congestion is noted. Small right-sided pleural effusion is noted. Metallic densities are noted symmetrically over the upper chest likely artifactual in nature. IMPRESSION: Small right pleural effusion and vascular congestion. Electronically Signed   By: Violeta Grey M.D.   On: 11/28/2023 02:59    EKG: Normal sinus rhythm mild right axis nonspecific ST-T wave changes  ASSESSMENT AND PLAN:  Acute diastolic congestive heart failure Chronic renal sufficiency stage IV Shortness of breath COPD with exacerbation Coronary artery disease thought to be moderate Obstructive sleep apnea Diabetes type 2 Lung  cancer Hyponatremia with a history of SIADH . Plan Continue progressive care management Follow-up EKGs troponins Agree with nephrology input for renal function and management Inhalers for COPD supplemental oxygen as necessary consider pulmonary input History of smoking continue to advise patient refrain from tobacco abuse Obstructive sleep apnea recommend sleep study CPAP if indicated Diabetes management to control Levemir  continue to follow blood pressure sugar diet exercise diabetic diet History of lung cancer stable follow-up with oncology as necessary Recommend physical therapy increase activity Hypertension management and control continue amlodipine  losartan  metoprolol  Hyperlipidemia management with Crestor  follow-up lipid and liver studies  Signed: Antonette Batters MD, 11/29/2023, 12:21 PM

## 2023-11-29 NOTE — Plan of Care (Signed)

## 2023-11-30 DIAGNOSIS — I5031 Acute diastolic (congestive) heart failure: Secondary | ICD-10-CM | POA: Diagnosis not present

## 2023-11-30 DIAGNOSIS — E871 Hypo-osmolality and hyponatremia: Secondary | ICD-10-CM | POA: Diagnosis not present

## 2023-11-30 LAB — CBC
HCT: 23.1 % — ABNORMAL LOW (ref 36.0–46.0)
Hemoglobin: 7.7 g/dL — ABNORMAL LOW (ref 12.0–15.0)
MCH: 30.3 pg (ref 26.0–34.0)
MCHC: 33.3 g/dL (ref 30.0–36.0)
MCV: 90.9 fL (ref 80.0–100.0)
Platelets: 271 10*3/uL (ref 150–400)
RBC: 2.54 MIL/uL — ABNORMAL LOW (ref 3.87–5.11)
RDW: 14.6 % (ref 11.5–15.5)
WBC: 14.8 10*3/uL — ABNORMAL HIGH (ref 4.0–10.5)
nRBC: 0 % (ref 0.0–0.2)

## 2023-11-30 LAB — BASIC METABOLIC PANEL WITH GFR
Anion gap: 9 (ref 5–15)
BUN: 82 mg/dL — ABNORMAL HIGH (ref 8–23)
CO2: 20 mmol/L — ABNORMAL LOW (ref 22–32)
Calcium: 8.2 mg/dL — ABNORMAL LOW (ref 8.9–10.3)
Chloride: 95 mmol/L — ABNORMAL LOW (ref 98–111)
Creatinine, Ser: 4.09 mg/dL — ABNORMAL HIGH (ref 0.44–1.00)
GFR, Estimated: 11 mL/min — ABNORMAL LOW (ref >60)
Glucose, Bld: 312 mg/dL — ABNORMAL HIGH (ref 70–99)
Potassium: 5.3 mmol/L — ABNORMAL HIGH (ref 3.5–5.1)
Sodium: 124 mmol/L — ABNORMAL LOW (ref 135–145)

## 2023-11-30 LAB — SODIUM: Sodium: 125 mmol/L — ABNORMAL LOW (ref 135–145)

## 2023-11-30 LAB — HEPATITIS B SURFACE ANTIGEN: Hepatitis B Surface Ag: NONREACTIVE

## 2023-11-30 LAB — GLUCOSE, CAPILLARY
Glucose-Capillary: 192 mg/dL — ABNORMAL HIGH (ref 70–99)
Glucose-Capillary: 399 mg/dL — ABNORMAL HIGH (ref 70–99)

## 2023-11-30 MED ORDER — DIAZEPAM 5 MG PO TABS
5.0000 mg | ORAL_TABLET | ORAL | Status: AC
  Administered 2023-11-30: 5 mg via ORAL
  Filled 2023-11-30: qty 1

## 2023-11-30 MED ORDER — CHLORHEXIDINE GLUCONATE CLOTH 2 % EX PADS
6.0000 | MEDICATED_PAD | Freq: Every day | CUTANEOUS | Status: DC
  Administered 2023-12-01 – 2023-12-15 (×11): 6 via TOPICAL

## 2023-11-30 NOTE — Progress Notes (Addendum)
 [facility]  ROUNDING NOTE   Subjective:  [name] is a 78 y.o.  female with past medical history of hypertension, long-standing diabetes mellitus type 2, lower extremity edema, hyperlipidemia, tobacco abuse, prior history of CVA, COPD, obstructive sleep apnea, lumbar spinal fusion, left upper lobe lung mass who presents with volume overload and increasing shortness of breath.  Update: Tolvaptan given yesterday, only 200cc output overnight. Creatinine increased to 4.09. Daughter at bedside. Patient endorses feeling poorly and malaise. Patient and daughter agreeable to start renal replacement therapy. Plan for access placement tomorrow and dialysis.   Objective:  Vital signs in last 24 hours:  Temp:  [97.6 F (36.4 C)-98.2 F (36.8 C)] 97.9 F (36.6 C) (06/15 0826) Pulse Rate:  [70-93] 93 (06/15 1222) Resp:  [18-19] 18 (06/15 0423) BP: (163-194)/(56-67) 194/67 (06/15 1222) SpO2:  [93 %-100 %] 93 % (06/15 1222) Weight:  [79.1 kg] 79.1 kg (06/15 0441)  Weight change: -1 kg Filed Weights   11/28/23 0555 11/29/23 0500 11/30/23 0441  Weight: 79.4 kg 80.1 kg 79.1 kg    Intake/Output: I/O last 3 completed shifts: In: 480 [P.O.:480] Out: 400 [Urine:400]   Intake/Output this shift:  No intake/output data recorded.  Physical Exam: General: Ill-appearing, Uremic  Head: Normocephalic, atraumatic. Dry mucosal membranes  Eyes: Anicteric, PERRL  Neck: Supple, trachea midline  Lungs:  Diminished to auscultation, on 2L, dyspneic with speech  Heart: Regular rate and rhythm  Abdomen:  Soft, nontender,   Extremities:  Trace peripheral edema.  Neurologic: Nonfocal, moving all four extremities  Skin: No lesions  Access: None    Basic Metabolic Panel: Recent Labs  Lab 11/28/23 0246 11/28/23 1452 11/28/23 1620 11/29/23 0600 11/29/23 1451 11/29/23 1831 11/30/23 0235 11/30/23 0236  NA 123* 121* 122* 125*  --  125* 124* 125*  K 5.2* 5.1 5.0 5.6* 5.1  --  5.3*   --   CL 94*  --  93* 96*  --   --  95*  --   CO2 19*  --  18* 19*  --   --  20*  --   GLUCOSE 207*  --  324* 282*  --   --  312*  --   BUN 67*  --  66* 73*  --   --  82*  --   CREATININE 3.59*  --  3.77* 3.95*  --   --  4.09*  --   CALCIUM  8.8*  --  8.5* 8.5*  --   --  8.2*  --   MG 2.2  --   --   --   --   --   --   --     Liver Function Tests: Recent Labs  Lab 11/28/23 0246 11/28/23 1620  AST 17 22  ALT 11 11  ALKPHOS 95 88  BILITOT 0.7 0.7  PROT 6.7 6.2*  ALBUMIN 2.9* 2.6*   No results for input(s): LIPASE, AMYLASE in the last 168 hours. No results for input(s): AMMONIA in the last 168 hours.  CBC: Recent Labs  Lab 11/28/23 0246 11/29/23 0600 11/30/23 0235  WBC 11.1* 14.6* 14.8*  NEUTROABS 9.7*  --   --   HGB 9.0* 7.9* 7.7*  HCT 27.4* 23.6* 23.1*  MCV 92.3 90.8 90.9  PLT 292 253 271    Cardiac Enzymes: No results for input(s): CKTOTAL, CKMB, CKMBINDEX, TROPONINI in the last 168 hours.  BNP: Invalid input(s): POCBNP  CBG: Recent Labs  Lab 11/28/23 2049 11/29/23 0906  11/29/23 1234 11/29/23 1725 11/29/23 2050  GLUCAP 397* 263* 189* 226* 336*    Microbiology: Results for orders placed or performed during the hospital encounter of 08/28/23  Blood culture (routine single)     Status: None   Collection Time: 08/28/23 12:40 PM   Specimen: BLOOD  Result Value Ref Range Status   Specimen Description BLOOD RIGHT ANTECUBITAL  Final   Special Requests   Final    BOTTLES DRAWN AEROBIC AND ANAEROBIC Blood Culture adequate volume   Culture   Final    NO GROWTH 5 DAYS Performed at [facility], [address] [place], [place] [address]    Report Status 09/02/2023 FINAL  Final  Blood culture (routine single)     Status: None   Collection Time: 08/28/23 12:40 PM   Specimen: BLOOD  Result Value Ref Range Status   Specimen Description BLOOD LEFT ANTECUBITAL  Final   Special Requests   Final    BOTTLES DRAWN AEROBIC AND ANAEROBIC  Blood Culture adequate volume   Culture   Final    NO GROWTH 5 DAYS Performed at [facility], [address] [place], [place] [address]    Report Status 09/02/2023 FINAL  Final    Coagulation Studies: No results for input(s): LABPROT, INR in the last 72 hours.  Urinalysis: No results for input(s): COLORURINE, LABSPEC, PHURINE, GLUCOSEU, HGBUR, BILIRUBINUR, KETONESUR, PROTEINUR, UROBILINOGEN, NITRITE, LEUKOCYTESUR in the last 72 hours.  Invalid input(s): APPERANCEUR    Imaging: ECHOCARDIOGRAM COMPLETE Result Date: 11/29/2023    ECHOCARDIOGRAM REPORT   Patient Name:   Mckenzie Lawrence Date of Exam: 11/29/2023 Medical Rec #:  [id number]      Height:       67.0 in Accession #:    [id number]     Weight:       176.6 lb Date of Birth:  01-30-1946      BSA:          1.918 m Patient Age:    78 years       BP:           163/51 mmHg Patient Gender: F              HR:           81 bpm. Exam Location:  [facility] Procedure: 2D Echo, Cardiac Doppler and Color Doppler (Both Spectral and Color            Flow Doppler were utilized during procedure). Indications:     CHF I50.31  History:         Patient has prior history of Echocardiogram examinations, most                  recent 07/23/2023.  Sonographer:     Clenton Czech RDCS, FASE Referring Phys:  [id number] Alphonsus Jeans Diagnosing Phys: Dwayne D Callwood MD IMPRESSIONS  1. Left ventricular ejection fraction, by estimation, is 55 to 60%. The left ventricle has normal function. The left ventricle has no regional wall motion abnormalities. The left ventricular internal cavity size was moderately dilated. Left ventricular diastolic parameters are consistent with Grade II diastolic dysfunction (pseudonormalization).  2. Right ventricular systolic function is normal. The right ventricular size is normal.  3. Left atrial size was mildly dilated.  4. The mitral valve is normal in structure. Trivial mitral valve regurgitation.  5. The  aortic valve is normal in structure. Aortic valve regurgitation is not visualized. FINDINGS  Left Ventricle: Left ventricular  ejection fraction, by estimation, is 55 to 60%. The left ventricle has normal function. The left ventricle has no regional wall motion abnormalities. Strain was performed and the global longitudinal strain is indeterminate. The left ventricular internal cavity size was moderately dilated. There is no left ventricular hypertrophy. Left ventricular diastolic parameters are consistent with Grade II diastolic dysfunction (pseudonormalization). Right Ventricle: The right ventricular size is normal. No increase in right ventricular wall thickness. Right ventricular systolic function is normal. Left Atrium: Left atrial size was mildly dilated. Right Atrium: Right atrial size was normal in size. Pericardium: There is no evidence of pericardial effusion. Mitral Valve: The mitral valve is normal in structure. Trivial mitral valve regurgitation. Tricuspid Valve: The tricuspid valve is normal in structure. Tricuspid valve regurgitation is mild. Aortic Valve: The aortic valve is normal in structure. Aortic valve regurgitation is not visualized. Aortic valve peak gradient measures 8.8 mmHg. Pulmonic Valve: The pulmonic valve was normal in structure. Pulmonic valve regurgitation is not visualized. Aorta: The ascending aorta was not well visualized. IAS/Shunts: No atrial level shunt detected by color flow Doppler. Additional Comments: 3D was performed not requiring image post processing on an independent workstation and was indeterminate.  LEFT VENTRICLE PLAX 2D LVIDd:         5.50 cm     Diastology LVIDs:         3.80 cm     LV e' medial:    7.18 cm/s LV PW:         1.00 cm     LV E/e' medial:  19.9 LV IVS:        1.10 cm     LV e' lateral:   8.38 cm/s LVOT diam:     1.80 cm     LV E/e' lateral: 17.1 LV SV:         63 LV SV Index:   33 LVOT Area:     2.54 cm  LV Volumes (MOD) LV vol d, MOD A2C: 74.1 ml LV  vol d, MOD A4C: 70.9 ml LV vol s, MOD A2C: 37.0 ml LV vol s, MOD A4C: 28.8 ml LV SV MOD A2C:     37.1 ml LV SV MOD A4C:     70.9 ml LV SV MOD BP:      41.0 ml RIGHT VENTRICLE RV Basal diam:  3.20 cm RV S prime:     14.80 cm/s TAPSE (M-mode): 2.6 cm LEFT ATRIUM             Index        RIGHT ATRIUM           Index LA diam:        4.30 cm 2.24 cm/m   RA Area:     19.70 cm LA Vol (A2C):   44.9 ml 23.41 ml/m  RA Volume:   55.40 ml  28.88 ml/m LA Vol (A4C):   53.8 ml 28.05 ml/m LA Biplane Vol: 51.9 ml 27.06 ml/m  AORTIC VALVE                 PULMONIC VALVE AV Area (Vmax): 1.84 cm     PV Vmax:        1.01 m/s AV Vmax:        148.00 cm/s  PV Peak grad:   4.1 mmHg AV Peak Grad:   8.8 mmHg     RVOT Peak grad: 3 mmHg LVOT Vmax:      107.00 cm/s LVOT Vmean:  76.000 cm/s LVOT VTI:       0.249 m  AORTA Ao Root diam: 2.90 cm MITRAL VALVE MV Area (PHT): 4.86 cm     SHUNTS MV Decel Time: 156 msec     Systemic VTI:  0.25 m MV E velocity: 143.00 cm/s  Systemic Diam: 1.80 cm MV A velocity: 80.30 cm/s MV E/A ratio:  1.78 Dwayne D Callwood MD Electronically signed by Antonette Batters MD Signature Date/Time: 11/29/2023/10:33:04 AM    Final      Medications:     amLODipine   10 mg Oral Daily   aspirin  EC  81 mg Oral QHS   benzonatate  200 mg Oral TID   diazepam   5 mg Oral Q1500   enoxaparin  (LOVENOX ) injection  30 mg Subcutaneous Q24H   guaiFENesin   600 mg Oral BID   insulin  aspart  0-5 Units Subcutaneous QHS   insulin  aspart  0-9 Units Subcutaneous TID WC   insulin  glargine-yfgn  10 Units Subcutaneous BID   losartan   100 mg Oral QHS   metoprolol  succinate  100 mg Oral QHS   nicotine   21 mg Transdermal Daily   predniSONE   40 mg Oral Q breakfast   rosuvastatin   5 mg Oral Daily   sodium bicarbonate   650 mg Oral BID   acetaminophen  **OR** acetaminophen , albuterol , guaiFENesin -dextromethorphan, hydrALAZINE , ondansetron  **OR** ondansetron  (ZOFRAN ) IV, oxyCODONE -acetaminophen , oxyCODONE -acetaminophen ,  sucralfate , zolpidem   Assessment/ Plan:  [name] is a 78 y.o.  female  with past medical history of hypertension, long-standing diabetes mellitus type 2, lower extremity edema, hyperlipidemia, tobacco abuse, prior history of CVA, COPD, obstructive sleep apnea, lumbar spinal fusion, left upper lobe lung mass who presents with volume overload and increasing shortness of breath.   1.  Acute kidney injury/chronic kidney disease stage IV/diabetes mellitus type 2 with chronic kidney disease/proteinuria.  eGFR down to 13. Tolvaptan given yesterday, only 200cc output overnight. Creatinine increased to 4.09. Patient and daughter agreeable to start renal replacement therapy. Appreciate vascular consult for Permcath placement tomorrow. Make NPO at midnight. Scheduled dialysis after access placement for 2 hrs.  Lab Results  Component Value Date   CREATININE 4.09 (H) 11/30/2023   CREATININE 3.95 (H) 11/29/2023   CREATININE 3.77 (H) 11/28/2023     Intake/Output Summary (Last 24 hours) at 11/30/2023 1256 Last data filed at 11/30/2023 0900 Gross per 24 hour  Intake 0 ml  Output 200 ml  Net -200 ml      2.  Hyponatremia.  Likely secondary to volume overload.  Na improved to 125 today.    3.  Volume overload.  Repeat 2D echocardiogram showed no irregularities.  BNP elevated at 2450.   4.  Anemia of chronic kidney disease.  Hemoglobin 7.9 Patient has history of upper lobe mass, ESA may not be appropriate. Consider transfusion HgB<7     LOS: 2 Jackee Glasner Marisa Sickles 6/15/202512:49 PM

## 2023-11-30 NOTE — Progress Notes (Signed)
 \ PROGRESS NOTE    Mckenzie Lawrence  LKG:[id number] DOB: 1946/04/03 DOA: 11/28/2023 PCP: Mckenzie San, MD   Assessment & Plan:   Principal Problem:   Hypertensive urgency/emergency Active Problems:   COPD with acute exacerbation (HCC)   Acute dyspnea   CAD (coronary artery disease)   Chest pain   Acute renal failure superimposed on stage 4 chronic kidney disease (HCC)   Hyponatremia   Hypertensive emergency  Assessment and Plan: Hypertensive urgency/emergency: resolved emergency but still w/ HTN, poorly controlled. Continue on losartan , amlodipine , metoprolol . IV hydralazine  prn   Acute diastolic CHF: w/ elevated BNP, CXR showing vascular congestion, pitting LE edema & dyspnea. S/p tolvaptan x 1. Monitor I/Os. Echo shows EF 50-55%, grade II diastolic dysfunction, no regional wall motion abnormalities. Cardio following and recs apprec    COPD exacerbation: continue on steroids, bronchodilators & encourage incentive spirometry. Continue on tessalon pearles  Chest pain: w/ hx of CAD. Troponins are minimally elevated. EKG shows no ischemic changes. Continue on metoprolol , losartan , statin, aspirin . Ok for pt to have narcotics as per pt's daughter  AKI on CKDIV: Cr is trending up again today. Will get tunneled HD cath as per vasc surg and start HD tomorrow as per nephro. Nephro following and recs apprec   Hyperkalemia: will be managed w/ HD    Hyponatremia: w/ hx of SIADH. Labile. S/p tolvaptan x 1 as per nephro. Nephro following and recs apprec   ACD: likely secondary to CKD. Will transfuse if Hb < 7.0   Acute diarrhea: x 3 days. Resolved as per pt    Esophageal dysphagia: s/p botox  via EGD in 08/2022. Aspiration precautions. Continue w/ supportive care   OSA: CPAP qhs  DM2: well controlled, HbA1c 6.1. Continue on glargine, SSI w/ accuchecks       DVT prophylaxis: lovenox  Code Status: full  Family Communication: discussed pt's care w/ pt's daughter at bedside and answered  her questions  Disposition Plan: depends on PT/OT recs (not consulted yet)  Level of care: Progressive  Status is: Inpatient Remains inpatient appropriate because: severity of illness    Consultants:  Nephro   Procedures:   Antimicrobials:   Subjective: Pt c/o cough and not feeling well  Objective: Vitals:   11/30/23 0423 11/30/23 0441 11/30/23 0740 11/30/23 0826  BP: (!) 165/65   (!) 173/56  Pulse: 70   79  Resp: 18     Temp: 97.8 F (36.6 C)   97.9 F (36.6 C)  TempSrc:      SpO2: 94%  95% 95%  Weight:  79.1 kg      Intake/Output Summary (Last 24 hours) at 11/30/2023 0844 Last data filed at 11/29/2023 2100 Gross per 24 hour  Intake 240 ml  Output 200 ml  Net 40 ml   Filed Weights   11/28/23 0555 11/29/23 0500 11/30/23 0441  Weight: 79.4 kg 80.1 kg 79.1 kg    Examination:  General exam:appears uncomfortable   Respiratory system: course breath sounds b/l  Cardiovascular system: S1 & S2+. No rubs or clicks  Gastrointestinal system: abd is soft, NT, ND & hypoactive bowel sounds Central nervous system: alert & awake. Moves all extremities  Psychiatry: Judgement and insight appears at baseline.  Flat mood and affect    Data Reviewed: I have personally reviewed following labs and imaging studies  CBC: Recent Labs  Lab 11/28/23 0246 11/29/23 0600 11/30/23 0235  WBC 11.1* 14.6* 14.8*  NEUTROABS 9.7*  --   --   HGB  9.0* 7.9* 7.7*  HCT 27.4* 23.6* 23.1*  MCV 92.3 90.8 90.9  PLT 292 253 271   Basic Metabolic Panel: Recent Labs  Lab 11/28/23 0246 11/28/23 1452 11/28/23 1620 11/29/23 0600 11/29/23 1451 11/29/23 1831 11/30/23 0235 11/30/23 0236  NA 123* 121* 122* 125*  --  125* 124* 125*  K 5.2* 5.1 5.0 5.6* 5.1  --  5.3*  --   CL 94*  --  93* 96*  --   --  95*  --   CO2 19*  --  18* 19*  --   --  20*  --   GLUCOSE 207*  --  324* 282*  --   --  312*  --   BUN 67*  --  66* 73*  --   --  82*  --   CREATININE 3.59*  --  3.77* 3.95*  --   --   4.09*  --   CALCIUM  8.8*  --  8.5* 8.5*  --   --  8.2*  --   MG 2.2  --   --   --   --   --   --   --    GFR: Estimated Creatinine Clearance: 12.7 mL/min (A) (by C-G formula based on SCr of 4.09 mg/dL (H)). Liver Function Tests: Recent Labs  Lab 11/28/23 0246 11/28/23 1620  AST 17 22  ALT 11 11  ALKPHOS 95 88  BILITOT 0.7 0.7  PROT 6.7 6.2*  ALBUMIN 2.9* 2.6*   No results for input(s): LIPASE, AMYLASE in the last 168 hours. No results for input(s): AMMONIA in the last 168 hours. Coagulation Profile: No results for input(s): INR, PROTIME in the last 168 hours. Cardiac Enzymes: No results for input(s): CKTOTAL, CKMB, CKMBINDEX, TROPONINI in the last 168 hours. BNP (last 3 results) No results for input(s): PROBNP in the last 8760 hours. HbA1C: No results for input(s): HGBA1C in the last 72 hours. CBG: Recent Labs  Lab 11/28/23 2049 11/29/23 0906 11/29/23 1234 11/29/23 1725 11/29/23 2050  GLUCAP 397* 263* 189* 226* 336*   Lipid Profile: No results for input(s): CHOL, HDL, LDLCALC, TRIG, CHOLHDL, LDLDIRECT in the last 72 hours. Thyroid Function Tests: No results for input(s): TSH, T4TOTAL, FREET4, T3FREE, THYROIDAB in the last 72 hours. Anemia Panel: No results for input(s): VITAMINB12, FOLATE, FERRITIN, TIBC, IRON, RETICCTPCT in the last 72 hours. Sepsis Labs: No results for input(s): PROCALCITON, LATICACIDVEN in the last 168 hours.  No results found for this or any previous visit (from the past 240 hours).       Radiology Studies: ECHOCARDIOGRAM COMPLETE Result Date: 11/29/2023    ECHOCARDIOGRAM REPORT   Patient Name:   Mckenzie Lawrence Date of Exam: 11/29/2023 Medical Rec #:  [id number]      Height:       67.0 in Accession #:    [id number]     Weight:       176.6 lb Date of Birth:  1945-07-10      BSA:          1.918 m Patient Age:    78 years       BP:           163/51 mmHg Patient Gender: F              HR:            81 bpm. Exam Location:  [facility] Procedure: 2D Echo, Cardiac Doppler and Color Doppler (Both Spectral and Color  Flow Doppler were utilized during procedure). Indications:     CHF I50.31  History:         Patient has prior history of Echocardiogram examinations, most                  recent 07/23/2023.  Sonographer:     Mckenzie Lawrence RDCS, FASE Referring Phys:  [id number] Alphonsus Jeans Diagnosing Phys: Dwayne D Callwood MD IMPRESSIONS  1. Left ventricular ejection fraction, by estimation, is 55 to 60%. The left ventricle has normal function. The left ventricle has no regional wall motion abnormalities. The left ventricular internal cavity size was moderately dilated. Left ventricular diastolic parameters are consistent with Grade II diastolic dysfunction (pseudonormalization).  2. Right ventricular systolic function is normal. The right ventricular size is normal.  3. Left atrial size was mildly dilated.  4. The mitral valve is normal in structure. Trivial mitral valve regurgitation.  5. The aortic valve is normal in structure. Aortic valve regurgitation is not visualized. FINDINGS  Left Ventricle: Left ventricular ejection fraction, by estimation, is 55 to 60%. The left ventricle has normal function. The left ventricle has no regional wall motion abnormalities. Strain was performed and the global longitudinal strain is indeterminate. The left ventricular internal cavity size was moderately dilated. There is no left ventricular hypertrophy. Left ventricular diastolic parameters are consistent with Grade II diastolic dysfunction (pseudonormalization). Right Ventricle: The right ventricular size is normal. No increase in right ventricular wall thickness. Right ventricular systolic function is normal. Left Atrium: Left atrial size was mildly dilated. Right Atrium: Right atrial size was normal in size. Pericardium: There is no evidence of pericardial effusion. Mitral Valve: The mitral valve is normal  in structure. Trivial mitral valve regurgitation. Tricuspid Valve: The tricuspid valve is normal in structure. Tricuspid valve regurgitation is mild. Aortic Valve: The aortic valve is normal in structure. Aortic valve regurgitation is not visualized. Aortic valve peak gradient measures 8.8 mmHg. Pulmonic Valve: The pulmonic valve was normal in structure. Pulmonic valve regurgitation is not visualized. Aorta: The ascending aorta was not well visualized. IAS/Shunts: No atrial level shunt detected by color flow Doppler. Additional Comments: 3D was performed not requiring image post processing on an independent workstation and was indeterminate.  LEFT VENTRICLE PLAX 2D LVIDd:         5.50 cm     Diastology LVIDs:         3.80 cm     LV e' medial:    7.18 cm/s LV PW:         1.00 cm     LV E/e' medial:  19.9 LV IVS:        1.10 cm     LV e' lateral:   8.38 cm/s LVOT diam:     1.80 cm     LV E/e' lateral: 17.1 LV SV:         63 LV SV Index:   33 LVOT Area:     2.54 cm  LV Volumes (MOD) LV vol d, MOD A2C: 74.1 ml LV vol d, MOD A4C: 70.9 ml LV vol s, MOD A2C: 37.0 ml LV vol s, MOD A4C: 28.8 ml LV SV MOD A2C:     37.1 ml LV SV MOD A4C:     70.9 ml LV SV MOD BP:      41.0 ml RIGHT VENTRICLE RV Basal diam:  3.20 cm RV S prime:     14.80 cm/s TAPSE (M-mode): 2.6 cm LEFT ATRIUM  Index        RIGHT ATRIUM           Index LA diam:        4.30 cm 2.24 cm/m   RA Area:     19.70 cm LA Vol (A2C):   44.9 ml 23.41 ml/m  RA Volume:   55.40 ml  28.88 ml/m LA Vol (A4C):   53.8 ml 28.05 ml/m LA Biplane Vol: 51.9 ml 27.06 ml/m  AORTIC VALVE                 PULMONIC VALVE AV Area (Vmax): 1.84 cm     PV Vmax:        1.01 m/s AV Vmax:        148.00 cm/s  PV Peak grad:   4.1 mmHg AV Peak Grad:   8.8 mmHg     RVOT Peak grad: 3 mmHg LVOT Vmax:      107.00 cm/s LVOT Vmean:     76.000 cm/s LVOT VTI:       0.249 m  AORTA Ao Root diam: 2.90 cm MITRAL VALVE MV Area (PHT): 4.86 cm     SHUNTS MV Decel Time: 156 msec     Systemic VTI:   0.25 m MV E velocity: 143.00 cm/s  Systemic Diam: 1.80 cm MV A velocity: 80.30 cm/s MV E/A ratio:  1.78 Dwayne D Callwood MD Electronically signed by Antonette Batters MD Signature Date/Time: 11/29/2023/10:33:04 AM    Final         Scheduled Meds:  amLODipine   10 mg Oral Daily   aspirin  EC  81 mg Oral QHS   benzonatate  200 mg Oral TID   diazepam   5 mg Oral Q1500   enoxaparin  (LOVENOX ) injection  30 mg Subcutaneous Q24H   guaiFENesin   600 mg Oral BID   insulin  aspart  0-5 Units Subcutaneous QHS   insulin  aspart  0-9 Units Subcutaneous TID WC   insulin  glargine-yfgn  10 Units Subcutaneous BID   losartan   100 mg Oral QHS   metoprolol  succinate  100 mg Oral QHS   nicotine   21 mg Transdermal Daily   predniSONE   40 mg Oral Q breakfast   rosuvastatin   5 mg Oral Daily   sodium bicarbonate   650 mg Oral BID   Continuous Infusions:   LOS: 2 days       Alphonsus Jeans, MD [facility] Pager [phone]  If 7PM-7AM, please contact night-coverage [email or web address] 11/30/2023, 8:44 AM

## 2023-11-30 NOTE — Plan of Care (Signed)

## 2023-11-30 NOTE — Plan of Care (Signed)
  Problem: Fluid Volume: Goal: Ability to maintain a balanced intake and output will improve Outcome: Progressing   Problem: Skin Integrity: Goal: Risk for impaired skin integrity will decrease Outcome: Progressing   Problem: Tissue Perfusion: Goal: Adequacy of tissue perfusion will improve Outcome: Progressing   Problem: Clinical Measurements: Goal: Respiratory complications will improve Outcome: Progressing Goal: Cardiovascular complication will be avoided Outcome: Progressing   Problem: Activity: Goal: Risk for activity intolerance will decrease Outcome: Progressing

## 2023-11-30 NOTE — Progress Notes (Signed)
 [facility] Cardiology    SUBJECTIVE: Patient states she does not feel good she is short of breath dyspneic weak fatigue is preop for dialysis catheter placement but the breathing is not been improved with inhalers blood pressures also been difficult to manage.  Patient appears like she may need dialysis treatment to help with blood pressure and congestion   Vitals:   11/29/23 2050 11/29/23 2352 11/30/23 0423 11/30/23 0441  BP: (!) 178/57 (!) 170/60 (!) 165/65   Pulse: 76 73 70   Resp: 19 18 18    Temp: 97.8 F (36.6 C) 97.6 F (36.4 C) 97.8 F (36.6 C)   TempSrc: Oral     SpO2: 100% 95% 94%   Weight:    79.1 kg     Intake/Output Summary (Last 24 hours) at 11/30/2023 0734 Last data filed at 11/29/2023 2100 Gross per 24 hour  Intake 240 ml  Output 200 ml  Net 40 ml      PHYSICAL EXAM  General: Well developed, well nourished, in no acute distress HEENT:  Normocephalic and atramatic Neck:  No JVD.  Lungs: Bilateral rhonchi bilaterally to auscultation and percussion. Heart: HRRR . Normal S1 and S2 without gallops or murmurs.  Abdomen: Bowel sounds are positive, abdomen soft and non-tender  Msk:  Back normal, normal gait. Normal strength and tone for age. Extremities: No clubbing, cyanosis or edema.   Neuro: Alert and oriented X 3. Psych:  Good affect, responds appropriately   LABS: Basic Metabolic Panel: Recent Labs    11/28/23 0246 11/28/23 1452 11/29/23 0600 11/29/23 1451 11/29/23 1831 11/30/23 0235 11/30/23 0236  NA 123*   < > 125*  --    < > 124* 125*  K 5.2*   < > 5.6* 5.1  --  5.3*  --   CL 94*   < > 96*  --   --  95*  --   CO2 19*   < > 19*  --   --  20*  --   GLUCOSE 207*   < > 282*  --   --  312*  --   BUN 67*   < > 73*  --   --  82*  --   CREATININE 3.59*   < > 3.95*  --   --  4.09*  --   CALCIUM  8.8*   < > 8.5*  --   --  8.2*  --   MG 2.2  --   --   --   --   --   --    < > = values in this interval not displayed.   Liver Function Tests: Recent Labs     11/28/23 0246 11/28/23 1620  AST 17 22  ALT 11 11  ALKPHOS 95 88  BILITOT 0.7 0.7  PROT 6.7 6.2*  ALBUMIN 2.9* 2.6*   No results for input(s): LIPASE, AMYLASE in the last 72 hours. CBC: Recent Labs    11/28/23 0246 11/29/23 0600 11/30/23 0235  WBC 11.1* 14.6* 14.8*  NEUTROABS 9.7*  --   --   HGB 9.0* 7.9* 7.7*  HCT 27.4* 23.6* 23.1*  MCV 92.3 90.8 90.9  PLT 292 253 271   Cardiac Enzymes: No results for input(s): CKTOTAL, CKMB, CKMBINDEX, TROPONINI in the last 72 hours. BNP: Invalid input(s): POCBNP D-Dimer: No results for input(s): DDIMER in the last 72 hours. Hemoglobin A1C: No results for input(s): HGBA1C in the last 72 hours. Fasting Lipid Panel: No results for input(s): CHOL, HDL, LDLCALC,  TRIG, CHOLHDL, LDLDIRECT in the last 72 hours. Thyroid Function Tests: No results for input(s): TSH, T4TOTAL, T3FREE, THYROIDAB in the last 72 hours.  Invalid input(s): FREET3 Anemia Panel: No results for input(s): VITAMINB12, FOLATE, FERRITIN, TIBC, IRON, RETICCTPCT in the last 72 hours.  ECHOCARDIOGRAM COMPLETE Result Date: 11/29/2023    ECHOCARDIOGRAM REPORT   Patient Name:   Mckenzie Lawrence Date of Exam: 11/29/2023 Medical Rec #:  [id number]      Height:       67.0 in Accession #:    [id number]     Weight:       176.6 lb Date of Birth:  1946/02/29      BSA:          1.918 m Patient Age:    78 years       BP:           163/51 mmHg Patient Gender: F              HR:           81 bpm. Exam Location:  [facility] Procedure: 2D Echo, Cardiac Doppler and Color Doppler (Both Spectral and Color            Flow Doppler were utilized during procedure). Indications:     CHF I50.31  History:         Patient has prior history of Echocardiogram examinations, most                  recent 07/23/2023.  Sonographer:     Clenton Czech RDCS, FASE Referring Phys:  [id number] Alphonsus Jeans Diagnosing Phys: Emelly Wurtz D Kellyn Mccary MD IMPRESSIONS  1. Left ventricular  ejection fraction, by estimation, is 55 to 60%. The left ventricle has normal function. The left ventricle has no regional wall motion abnormalities. The left ventricular internal cavity size was moderately dilated. Left ventricular diastolic parameters are consistent with Grade II diastolic dysfunction (pseudonormalization).  2. Right ventricular systolic function is normal. The right ventricular size is normal.  3. Left atrial size was mildly dilated.  4. The mitral valve is normal in structure. Trivial mitral valve regurgitation.  5. The aortic valve is normal in structure. Aortic valve regurgitation is not visualized. FINDINGS  Left Ventricle: Left ventricular ejection fraction, by estimation, is 55 to 60%. The left ventricle has normal function. The left ventricle has no regional wall motion abnormalities. Strain was performed and the global longitudinal strain is indeterminate. The left ventricular internal cavity size was moderately dilated. There is no left ventricular hypertrophy. Left ventricular diastolic parameters are consistent with Grade II diastolic dysfunction (pseudonormalization). Right Ventricle: The right ventricular size is normal. No increase in right ventricular wall thickness. Right ventricular systolic function is normal. Left Atrium: Left atrial size was mildly dilated. Right Atrium: Right atrial size was normal in size. Pericardium: There is no evidence of pericardial effusion. Mitral Valve: The mitral valve is normal in structure. Trivial mitral valve regurgitation. Tricuspid Valve: The tricuspid valve is normal in structure. Tricuspid valve regurgitation is mild. Aortic Valve: The aortic valve is normal in structure. Aortic valve regurgitation is not visualized. Aortic valve peak gradient measures 8.8 mmHg. Pulmonic Valve: The pulmonic valve was normal in structure. Pulmonic valve regurgitation is not visualized. Aorta: The ascending aorta was not well visualized. IAS/Shunts: No atrial  level shunt detected by color flow Doppler. Additional Comments: 3D was performed not requiring image post processing on an independent workstation and was indeterminate.  LEFT VENTRICLE PLAX  2D LVIDd:         5.50 cm     Diastology LVIDs:         3.80 cm     LV e' medial:    7.18 cm/s LV PW:         1.00 cm     LV E/e' medial:  19.9 LV IVS:        1.10 cm     LV e' lateral:   8.38 cm/s LVOT diam:     1.80 cm     LV E/e' lateral: 17.1 LV SV:         63 LV SV Index:   33 LVOT Area:     2.54 cm  LV Volumes (MOD) LV vol d, MOD A2C: 74.1 ml LV vol d, MOD A4C: 70.9 ml LV vol s, MOD A2C: 37.0 ml LV vol s, MOD A4C: 28.8 ml LV SV MOD A2C:     37.1 ml LV SV MOD A4C:     70.9 ml LV SV MOD BP:      41.0 ml RIGHT VENTRICLE RV Basal diam:  3.20 cm RV S prime:     14.80 cm/s TAPSE (M-mode): 2.6 cm LEFT ATRIUM             Index        RIGHT ATRIUM           Index LA diam:        4.30 cm 2.24 cm/m   RA Area:     19.70 cm LA Vol (A2C):   44.9 ml 23.41 ml/m  RA Volume:   55.40 ml  28.88 ml/m LA Vol (A4C):   53.8 ml 28.05 ml/m LA Biplane Vol: 51.9 ml 27.06 ml/m  AORTIC VALVE                 PULMONIC VALVE AV Area (Vmax): 1.84 cm     PV Vmax:        1.01 m/s AV Vmax:        148.00 cm/s  PV Peak grad:   4.1 mmHg AV Peak Grad:   8.8 mmHg     RVOT Peak grad: 3 mmHg LVOT Vmax:      107.00 cm/s LVOT Vmean:     76.000 cm/s LVOT VTI:       0.249 m  AORTA Ao Root diam: 2.90 cm MITRAL VALVE MV Area (PHT): 4.86 cm     SHUNTS MV Decel Time: 156 msec     Systemic VTI:  0.25 m MV E velocity: 143.00 cm/s  Systemic Diam: 1.80 cm MV A velocity: 80.30 cm/s MV E/A ratio:  1.78 Rontrell Moquin D Hurley Blevins MD Electronically signed by Antonette Batters MD Signature Date/Time: 11/29/2023/10:33:04 AM    Final      Echo left ventricular function at around 50 to 55% with some diastolic dysfunction  TELEMETRY: Normal sinus rhythm rate of 60-65:  ASSESSMENT AND PLAN:  Principal Problem:   Hypertensive urgency/emergency Active Problems:    Hyponatremia   CAD (coronary artery disease)   COPD with acute exacerbation (HCC)   Acute renal failure superimposed on stage 4 chronic kidney disease (HCC)   Chest pain   Hypertensive emergency   Acute dyspnea    Plan COPD exacerbation with shortness of breath continue inhalers consider pulmonary input Acute dyspnea with end-stage renal disease history of coronary artery disease COPD Acute on chronic renal insufficiency stage IV will probably need predialysis management to help with fluid  status and renal function Hypertension difficult to control continue amlodipine  losartan  metoprolol  hydralazine  as necessary Hyponatremia with history of SIADH continue current therapy as per nephrology Overall preserved left ventricular function with diastolic dysfunction do not recommend any invasive procedures at this stage Recommend conservative cardiac input at this stage   Antonette Batters, MD 11/30/2023 7:34 AM

## 2023-11-30 NOTE — H&P (View-Only) (Signed)
 VASCULAR SURGERY CONSULTATION   Requested by:  Alyne Babinski, NP  Reason for consultation: placement of tunneled dialysis catheter     History of Present Illness   Mckenzie Lawrence is a 78 y.o. (08/02/1945) female RHD who presents with cc: end stage renal disease.  Pt has known CKD IV with progression into end stage renal disease.  Renal requests tunneled dialysis catheter placement for start of hemodialysis tomorrow.  Pt denies any prior central access.  Pt denies any prior permanent access placement.  Pt c/o SOB currently and generally not feeling well.  Past Medical History:  Diagnosis Date   Anemia    Arthritis    Bladder incontinence    Broken foot    Cataracts, bilateral    Chronic kidney insufficiency    stage 3b   COPD (chronic obstructive pulmonary disease) (HCC)    wheezing   Coronary artery disease 04/25/2022   in [place]   COVID 2021   very mild case   CVA (cerebral vascular accident) (HCC) 2016   has had 3 strokes, states right side is slightlyweaker than left   Diabetes mellitus    insulin  dependent, Type 2   GERD (gastroesophageal reflux disease)    HLD (hyperlipidemia)    Hx of cardiovascular stress test    a. ETT (6/13):  Ex 5:13; no ischemic changes   Hypertension    controlled on meds   Lacunar stroke of left subthalamic region [facility]) 02/2015   Leg pain    left   Lower back pain    Neuromuscular disorder (HCC)    stroke right hand tingling   Orthostatic hypotension    Osteopenia 01/2017   T score -2.0 stable from prior DEXA   Pancreatitis 10/2021   PCOS (polycystic ovarian syndrome)    Personal history of tobacco use, presenting hazards to health 01/09/2015   PONV (postoperative nausea and vomiting)    Sleep apnea    uses CPAP nightly    Past Surgical History:  Procedure Laterality Date   BIOPSY  08/28/2022   Procedure: BIOPSY;  Surgeon: Felecia Hopper, MD;  Location: [facility];  Service: Gastroenterology;;   BOTOX  INJECTION   01/15/2022   Procedure: BOTOX  INJECTION;  Surgeon: Ozell Blunt, MD;  Location: [facility];  Service: Gastroenterology;;   BOTOX  INJECTION N/A 08/28/2022   Procedure: BOTOX  INJECTION;  Surgeon: Felecia Hopper, MD;  Location: [facility];  Service: Gastroenterology;  Laterality: N/A;   BOTOX  INJECTION Bilateral 05/27/2023   Procedure: BOTOX  INJECTION;  Surgeon: Ozell Blunt, MD;  Location: [facility];  Service: Gastroenterology;  Laterality: Bilateral;   BROW LIFT Bilateral 11/04/2017   Procedure: BLEPHAROPLASTY UPPER EYELID W/EXCESS SKIN;  Surgeon: Zacarias Hermann, MD;  Location: [facility];  Service: Ophthalmology;  Laterality: Bilateral;  DIABETES-insulin  dependent uses CPAP   CARDIAC CATHETERIZATION  20 yrs ago   found nothing   CARPAL TUNNEL RELEASE Bilateral    CATARACT EXTRACTION Bilateral    ELBOW SURGERY Bilateral    ESOPHAGOGASTRODUODENOSCOPY N/A 07/25/2021   Procedure: ESOPHAGOGASTRODUODENOSCOPY (EGD);  Surgeon: Toledo, Alphonsus Jeans, MD;  Location: [facility];  Service: Gastroenterology;  Laterality: N/A;  IDDM   ESOPHAGOGASTRODUODENOSCOPY N/A 01/15/2022   Procedure: ESOPHAGOGASTRODUODENOSCOPY (EGD);  Surgeon: Ozell Blunt, MD;  Location: [facility];  Service: Gastroenterology;  Laterality: N/A;  botox    ESOPHAGOGASTRODUODENOSCOPY (EGD) WITH PROPOFOL  N/A 08/28/2022   Procedure: ESOPHAGOGASTRODUODENOSCOPY (EGD) WITH PROPOFOL ;  Surgeon: Felecia Hopper, MD;  Location: [facility];  Service: Gastroenterology;  Laterality: N/A;  ESOPHAGOGASTRODUODENOSCOPY (EGD) WITH PROPOFOL  Bilateral 05/27/2023   Procedure: ESOPHAGOGASTRODUODENOSCOPY (EGD) WITH PROPOFOL ;  Surgeon: Ozell Blunt, MD;  Location: [facility];  Service: Gastroenterology;  Laterality: Bilateral;   FOOT SURGERY     Groin Abscess     HAND SURGERY     KNEE SURGERY Bilateral    LABIAL ABSCESS     LUMBAR LAMINECTOMY/DECOMPRESSION MICRODISCECTOMY Left 05/11/2018   Procedure: LUMBAR LAMINECTOMY/DECOMPRESSION  MICRODISCECTOMY 1 LEVEL- L4-5;  Surgeon: Berta Brittle, MD;  Location: [facility];  Service: Neurosurgery;  Laterality: Left;   LUMBAR LAMINECTOMY/DECOMPRESSION MICRODISCECTOMY Left 05/20/2022   Procedure: MICRODISCECTOMY L3-4;  Surgeon: Garry Kansas, MD;  Location: [facility];  Service: Neurosurgery;  Laterality: Left;  3C   LUMBAR WOUND DEBRIDEMENT N/A 08/08/2022   Procedure: INCISION AND DRAINAGE OF LUMBAR WOUND;  Surgeon: Garry Kansas, MD;  Location: [facility] OR;  Service: Neurosurgery;  Laterality: N/A;  3C   OOPHORECTOMY     BSO   PUBO VAG SLING     SHOULDER SURGERY     bilateral arthroscopies   TEE WITHOUT CARDIOVERSION N/A 07/25/2023   Procedure: TRANSESOPHAGEAL ECHOCARDIOGRAM (TEE);  Surgeon: Dorita Garter, MD;  Location: [facility];  Service: Cardiovascular;  Laterality: N/A;   VAGINAL HYSTERECTOMY  1979     Social History   Socioeconomic History   Marital status: Married    Spouse name: Not on file   Number of children: 1   Years of education: Not on file   Highest education level: Not on file  Occupational History   Not on file  Tobacco Use   Smoking status: Every Day    Current packs/day: 1.50    Average packs/day: 1.5 packs/day for 50.0 years (75.0 ttl pk-yrs)    Types: Cigarettes   Smokeless tobacco: Never   Tobacco comments:    3ppd x 10 year, then cut back to 1.5pdd since 02/2015  Vaping Use   Vaping status: Never Used  Substance and Sexual Activity   Alcohol use: Never    Alcohol/week: 0.0 standard drinks of alcohol   Drug use: No   Sexual activity: Not Currently    Birth control/protection: Surgical    Comment: Hx Hysterectomy, 1st intercourse 78 yo-Fewer than 5 partners  Other Topics Concern   Not on file  Social History Narrative   Lives at home with husband in a one story home.  Has 1 daughter.     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     Education: some college.   Social Drivers of Corporate investment banker Strain: Low Risk  (10/31/2023)    Received from [facility]   Overall Financial Resource Strain (CARDIA)    Difficulty of Paying Living Expenses: Not hard at all  Food Insecurity: No Food Insecurity (11/28/2023)   Hunger Vital Sign    Worried About Running Out of Food in the Last Year: Never true    Ran Out of Food in the Last Year: Never true  Transportation Needs: No Transportation Needs (11/28/2023)   PRAPARE - Administrator, Civil Service (Medical): No    Lack of Transportation (Non-Medical): No  Physical Activity: Not on file  Stress: Not on file  Social Connections: Unknown (11/28/2023)   Social Connection and Isolation Panel    Frequency of Communication with Friends and Family: More than three times a week    Frequency of Social Gatherings with Friends and Family: More than three times a week    Attends Religious Services:  More than 4 times per year    Active Member of Clubs or Organizations: Not on file    Attends Club or Organization Meetings: Not on file    Marital Status: Not on file  Intimate Partner Violence: Not At Risk (11/28/2023)   Humiliation, Afraid, Rape, and Kick questionnaire    Fear of Current or Ex-Partner: No    Emotionally Abused: No    Physically Abused: No    Sexually Abused: No    Family History  Problem Relation Age of Onset   Hypertension Mother    Heart disease Mother 89       MI   Diabetes Father    Diabetes Sister    Hypertension Sister    Diabetes Brother    Hypertension Brother    Heart disease Brother 42       CAD   Cancer Sister        Multiple myloma   Diabetes Brother     Current Facility-Administered Medications  Medication Dose Route Frequency Provider Last Rate Last Admin   acetaminophen  (TYLENOL ) tablet 650 mg  650 mg Oral Q6H PRN Duncan, Hazel V, MD   650 mg at 11/29/23 1148   Or   acetaminophen  (TYLENOL ) suppository 650 mg  650 mg Rectal Q6H PRN Lanetta Pion, MD       albuterol  (PROVENTIL ) (2.5 MG/3ML) 0.083% nebulizer  solution 2.5 mg  2.5 mg Nebulization Q2H PRN Duncan, Hazel V, MD   2.5 mg at 11/30/23 0739   amLODipine  (NORVASC ) tablet 10 mg  10 mg Oral Daily Lanetta Pion, MD   10 mg at 11/30/23 0933   aspirin  EC tablet 81 mg  81 mg Oral QHS Duncan, Hazel V, MD   81 mg at 11/29/23 2102   benzonatate (TESSALON) capsule 200 mg  200 mg Oral TID Alphonsus Jeans, MD   200 mg at 11/30/23 0933   diazepam  (VALIUM ) tablet 5 mg  5 mg Oral Q1500 Alphonsus Jeans, MD   5 mg at 11/29/23 1616   enoxaparin  (LOVENOX ) injection 30 mg  30 mg Subcutaneous Q24H Duncan, Hazel V, MD   30 mg at 11/30/23 0933   guaiFENesin  (MUCINEX ) 12 hr tablet 600 mg  600 mg Oral BID Lanetta Pion, MD   600 mg at 11/30/23 0933   guaiFENesin -dextromethorphan (ROBITUSSIN DM) 100-10 MG/5ML syrup 5 mL  5 mL Oral Q4H PRN Alphonsus Jeans, MD   5 mL at 11/28/23 1301   hydrALAZINE  (APRESOLINE ) injection 20 mg  20 mg Intravenous Q6H PRN Alphonsus Jeans, MD       insulin  aspart (novoLOG ) injection 0-5 Units  0-5 Units Subcutaneous QHS Duncan, Hazel V, MD   4 Units at 11/29/23 2110   insulin  aspart (novoLOG ) injection 0-9 Units  0-9 Units Subcutaneous TID WC Duncan, Hazel V, MD   5 Units at 11/30/23 0933   insulin  glargine-yfgn (SEMGLEE ) injection 10 Units  10 Units Subcutaneous BID Alphonsus Jeans, MD   10 Units at 11/30/23 0934   losartan  (COZAAR ) tablet 100 mg  100 mg Oral QHS Alphonsus Jeans, MD   100 mg at 11/29/23 2102   metoprolol  succinate (TOPROL -XL) 24 hr tablet 100 mg  100 mg Oral QHS Duncan, Hazel V, MD   100 mg at 11/29/23 2102   nicotine  (NICODERM CQ  - dosed in mg/24 hours) patch 21 mg  21 mg Transdermal Daily Alphonsus Jeans, MD   21 mg at 11/30/23 0981   ondansetron  (  ZOFRAN ) tablet 4 mg  4 mg Oral Q6H PRN Duncan, Hazel V, MD   4 mg at 11/29/23 2102   Or   ondansetron  (ZOFRAN ) injection 4 mg  4 mg Intravenous Q6H PRN Duncan, Hazel V, MD   4 mg at 11/28/23 1301   oxyCODONE -acetaminophen  (PERCOCET/ROXICET) 5-325 MG  per tablet 1 tablet  1 tablet Oral Q6H PRN Alphonsus Jeans, MD   1 tablet at 11/29/23 2103   oxyCODONE -acetaminophen  (PERCOCET/ROXICET) 5-325 MG per tablet 2 tablet  2 tablet Oral Q6H PRN Alphonsus Jeans, MD       predniSONE  (DELTASONE ) tablet 40 mg  40 mg Oral Q breakfast Lanetta Pion, MD   40 mg at 11/30/23 0933   rosuvastatin  (CRESTOR ) tablet 5 mg  5 mg Oral Daily Duncan, Hazel V, MD   5 mg at 11/30/23 1478   sodium bicarbonate  tablet 650 mg  650 mg Oral BID Duncan, Hazel V, MD   650 mg at 11/30/23 0933   sucralfate  (CARAFATE ) tablet 1 g  1 g Oral BID PRN Duncan, Hazel V, MD       zolpidem  (AMBIEN ) tablet 5 mg  5 mg Oral QHS PRN Elisabeth Guild, NP   5 mg at 11/29/23 2102    Allergies  Allergen Reactions   Iodine Anaphylaxis and Other (See Comments)    Pt states that she is allergic to ingested iodine only, okay for betadine.     Iodine I 131 Tositumomab Anaphylaxis   Shellfish Allergy Anaphylaxis   Codeine Nausea And Vomiting   Morphine  Sulfate Nausea And Vomiting   Irbesartan Other (See Comments)     Unknown  (Avapro)   Sulfa Antibiotics Other (See Comments)    Fever     REVIEW OF SYSTEMS (negative unless checked):   Cardiac:  []  Chest pain or chest pressure? [x]  Shortness of breath upon activity? [x]  Shortness of breath when lying flat? []  Irregular heart rhythm?  Vascular:  []  Pain in calf, thigh, or hip brought on by walking? []  Pain in feet at night that wakes you up from your sleep? []  Blood clot in your veins? [x]  Leg swelling?  Pulmonary:  []  Oxygen at home? []  Productive cough? []  Wheezing?  Neurologic:  []  Sudden weakness in arms or legs? []  Sudden numbness in arms or legs? []  Sudden onset of difficult speaking or slurred speech? []  Temporary loss of vision in one eye? []  Problems with dizziness?  Gastrointestinal:  []  Blood in stool? []  Vomited blood?  Genitourinary:  []  Burning when urinating? []  Blood in urine?  Psychiatric:  []   Major depression  Hematologic:  []  Bleeding problems? []  Problems with blood clotting?  Dermatologic:  []  Rashes or ulcers?  Constitutional:  []  Fever or chills?  Ear/Nose/Throat:  []  Change in hearing? []  Nose bleeds? []  Sore throat?  Musculoskeletal:  []  Back pain? []  Joint pain? []  Muscle pain?   Physical Examination     Vitals:   11/30/23 0423 11/30/23 0441 11/30/23 0740 11/30/23 0826  BP: (!) 165/65   (!) 173/56  Pulse: 70   79  Resp: 18     Temp: 97.8 F (36.6 C)   97.9 F (36.6 C)  TempSrc:      SpO2: 94%  95% 95%  Weight:  79.1 kg     Body mass index is 27.31 kg/m.  General Alert, O x 3, WD, Ill appearing  Pulmonary Sym exp, good B air movt, rales on BLL  Cardiac RRR, Nl S1,  S2, no Murmurs, No rubs, No S3,S4  Vascular Vessel Right Left  Radial Palpable Palpable  Brachial Palpable Palpable  Carotid Palpable, No Bruit Palpable, No Bruit  Aorta Not palpable N/A  Femoral Palpable Palpable  Popliteal Not palpable Not palpable  PT Faintly palpable Faintly palpable  DP Faintly palpable Faintly palpable    Musculo- skeletal M/S 5/5 throughout , Extremities without ischemic changes , minimal edema in legs  Neurologic Cranial nerves grossly intact, Pain and light touch intact in extremities, Motor exam as listed above  Psychiatric Judgement intact, Mood & affect appropriate for pt's clinical situation   Laboratory   CBC    Latest Ref Rng & Units 11/30/2023    2:35 AM 11/29/2023    6:00 AM 11/28/2023    2:46 AM  CBC  WBC 4.0 - 10.5 K/uL 14.8  14.6  11.1   Hemoglobin 12.0 - 15.0 g/dL 7.7  7.9  9.0   Hematocrit 36.0 - 46.0 % 23.1  23.6  27.4   Platelets 150 - 400 K/uL 271  253  292     BMP    Latest Ref Rng & Units 11/30/2023    2:36 AM 11/30/2023    2:35 AM 11/29/2023    6:31 PM  BMP  Glucose 70 - 99 mg/dL  086    BUN 8 - 23 mg/dL  82    Creatinine 5.78 - 1.00 mg/dL  4.69    Sodium 629 - 528 mmol/L 125  124  125   Potassium 3.5 - 5.1 mmol/L   5.3    Chloride 98 - 111 mmol/L  95    CO2 22 - 32 mmol/L  20    Calcium  8.9 - 10.3 mg/dL  8.2      Coagulation Lab Results  Component Value Date   INR 1.4 (H) 07/19/2023   INR 1.04 05/06/2018   INR 0.99 03/15/2015   No results found for: PTT  Lipids    Component Value Date/Time   CHOL 146 03/16/2015 0526   TRIG 122 11/10/2021 0530   HDL 34 (L) 03/16/2015 0526   CHOLHDL 4.3 03/16/2015 0526   VLDL 46 (H) 03/16/2015 0526   LDLCALC 66 03/16/2015 0526     Radiology     ECHOCARDIOGRAM COMPLETE Result Date: 11/29/2023    ECHOCARDIOGRAM REPORT   Patient Name:   TAKHIA SPOON Date of Exam: 11/29/2023 Medical Rec #:  [id number]      Height:       67.0 in Accession #:    [id number]     Weight:       176.6 lb Date of Birth:  09/18/1945      BSA:          1.918 m Patient Age:    78 years       BP:           163/51 mmHg Patient Gender: F              HR:           81 bpm. Exam Location:  [facility] Procedure: 2D Echo, Cardiac Doppler and Color Doppler (Both Spectral and Color            Flow Doppler were utilized during procedure). Indications:     CHF I50.31  History:         Patient has prior history of Echocardiogram examinations, most  recent 07/23/2023.  Sonographer:     Clenton Czech RDCS, FASE Referring Phys:  [id number] Alphonsus Jeans Diagnosing Phys: Dwayne D Callwood MD IMPRESSIONS  1. Left ventricular ejection fraction, by estimation, is 55 to 60%. The left ventricle has normal function. The left ventricle has no regional wall motion abnormalities. The left ventricular internal cavity size was moderately dilated. Left ventricular diastolic parameters are consistent with Grade II diastolic dysfunction (pseudonormalization).  2. Right ventricular systolic function is normal. The right ventricular size is normal.  3. Left atrial size was mildly dilated.  4. The mitral valve is normal in structure. Trivial mitral valve regurgitation.  5. The aortic valve is normal in structure.  Aortic valve regurgitation is not visualized. FINDINGS  Left Ventricle: Left ventricular ejection fraction, by estimation, is 55 to 60%. The left ventricle has normal function. The left ventricle has no regional wall motion abnormalities. Strain was performed and the global longitudinal strain is indeterminate. The left ventricular internal cavity size was moderately dilated. There is no left ventricular hypertrophy. Left ventricular diastolic parameters are consistent with Grade II diastolic dysfunction (pseudonormalization). Right Ventricle: The right ventricular size is normal. No increase in right ventricular wall thickness. Right ventricular systolic function is normal. Left Atrium: Left atrial size was mildly dilated. Right Atrium: Right atrial size was normal in size. Pericardium: There is no evidence of pericardial effusion. Mitral Valve: The mitral valve is normal in structure. Trivial mitral valve regurgitation. Tricuspid Valve: The tricuspid valve is normal in structure. Tricuspid valve regurgitation is mild. Aortic Valve: The aortic valve is normal in structure. Aortic valve regurgitation is not visualized. Aortic valve peak gradient measures 8.8 mmHg. Pulmonic Valve: The pulmonic valve was normal in structure. Pulmonic valve regurgitation is not visualized. Aorta: The ascending aorta was not well visualized. IAS/Shunts: No atrial level shunt detected by color flow Doppler. Additional Comments: 3D was performed not requiring image post processing on an independent workstation and was indeterminate.  LEFT VENTRICLE PLAX 2D LVIDd:         5.50 cm     Diastology LVIDs:         3.80 cm     LV e' medial:    7.18 cm/s LV PW:         1.00 cm     LV E/e' medial:  19.9 LV IVS:        1.10 cm     LV e' lateral:   8.38 cm/s LVOT diam:     1.80 cm     LV E/e' lateral: 17.1 LV SV:         63 LV SV Index:   33 LVOT Area:     2.54 cm  LV Volumes (MOD) LV vol d, MOD A2C: 74.1 ml LV vol d, MOD A4C: 70.9 ml LV vol s, MOD  A2C: 37.0 ml LV vol s, MOD A4C: 28.8 ml LV SV MOD A2C:     37.1 ml LV SV MOD A4C:     70.9 ml LV SV MOD BP:      41.0 ml RIGHT VENTRICLE RV Basal diam:  3.20 cm RV S prime:     14.80 cm/s TAPSE (M-mode): 2.6 cm LEFT ATRIUM             Index        RIGHT ATRIUM           Index LA diam:        4.30 cm 2.24 cm/m   RA  Area:     19.70 cm LA Vol (A2C):   44.9 ml 23.41 ml/m  RA Volume:   55.40 ml  28.88 ml/m LA Vol (A4C):   53.8 ml 28.05 ml/m LA Biplane Vol: 51.9 ml 27.06 ml/m  AORTIC VALVE                 PULMONIC VALVE AV Area (Vmax): 1.84 cm     PV Vmax:        1.01 m/s AV Vmax:        148.00 cm/s  PV Peak grad:   4.1 mmHg AV Peak Grad:   8.8 mmHg     RVOT Peak grad: 3 mmHg LVOT Vmax:      107.00 cm/s LVOT Vmean:     76.000 cm/s LVOT VTI:       0.249 m  AORTA Ao Root diam: 2.90 cm MITRAL VALVE MV Area (PHT): 4.86 cm     SHUNTS MV Decel Time: 156 msec     Systemic VTI:  0.25 m MV E velocity: 143.00 cm/s  Systemic Diam: 1.80 cm MV A velocity: 80.30 cm/s MV E/A ratio:  1.78 Dwayne D Callwood MD Electronically signed by Antonette Batters MD Signature Date/Time: 11/29/2023/10:33:04 AM    Final      Medical Decision Making   SANAAI DOANE is a 78 y.o. female who presents with: imminent ESRD  Based on the patient's vascular studies and examination, I have offered the patient: tunneled dialysis catheter placement. The patient is aware the risks of tunneled dialysis catheter placement include but are not limited to: bleeding, infection, central venous injury, pneumothorax, possible venous stenosis, possible malpositioning in the venous system, and possible infections related to long-term catheter presence.  The patient was aware of these risks and agreed to proceed. Pt will be scheduled with Dr. Vonna Guardian or Dr. Alfonse Iha tomorrow. Thank you for allowing us  to participate in this patient's care.   Roxy Cordial, MD, FACS, FSVS Covering for [facility]: [phone]  11/30/2023, 12:00  PM

## 2023-11-30 NOTE — Consult Note (Signed)
 VASCULAR SURGERY CONSULTATION   Requested by:  Alyne Babinski, NP  Reason for consultation: placement of tunneled dialysis catheter     History of Present Illness   Mckenzie Lawrence is a 78 y.o. (08/02/1945) female RHD who presents with cc: end stage renal disease.  Pt has known CKD IV with progression into end stage renal disease.  Renal requests tunneled dialysis catheter placement for start of hemodialysis tomorrow.  Pt denies any prior central access.  Pt denies any prior permanent access placement.  Pt c/o SOB currently and generally not feeling well.  Past Medical History:  Diagnosis Date   Anemia    Arthritis    Bladder incontinence    Broken foot    Cataracts, bilateral    Chronic kidney insufficiency    stage 3b   COPD (chronic obstructive pulmonary disease) (HCC)    wheezing   Coronary artery disease 04/25/2022   in [place]   COVID 2021   very mild case   CVA (cerebral vascular accident) (HCC) 2016   has had 3 strokes, states right side is slightlyweaker than left   Diabetes mellitus    insulin  dependent, Type 2   GERD (gastroesophageal reflux disease)    HLD (hyperlipidemia)    Hx of cardiovascular stress test    a. ETT (6/13):  Ex 5:13; no ischemic changes   Hypertension    controlled on meds   Lacunar stroke of left subthalamic region [facility]) 02/2015   Leg pain    left   Lower back pain    Neuromuscular disorder (HCC)    stroke right hand tingling   Orthostatic hypotension    Osteopenia 01/2017   T score -2.0 stable from prior DEXA   Pancreatitis 10/2021   PCOS (polycystic ovarian syndrome)    Personal history of tobacco use, presenting hazards to health 01/09/2015   PONV (postoperative nausea and vomiting)    Sleep apnea    uses CPAP nightly    Past Surgical History:  Procedure Laterality Date   BIOPSY  08/28/2022   Procedure: BIOPSY;  Surgeon: Felecia Hopper, MD;  Location: [facility];  Service: Gastroenterology;;   BOTOX  INJECTION   01/15/2022   Procedure: BOTOX  INJECTION;  Surgeon: Ozell Blunt, MD;  Location: [facility];  Service: Gastroenterology;;   BOTOX  INJECTION N/A 08/28/2022   Procedure: BOTOX  INJECTION;  Surgeon: Felecia Hopper, MD;  Location: [facility];  Service: Gastroenterology;  Laterality: N/A;   BOTOX  INJECTION Bilateral 05/27/2023   Procedure: BOTOX  INJECTION;  Surgeon: Ozell Blunt, MD;  Location: [facility];  Service: Gastroenterology;  Laterality: Bilateral;   BROW LIFT Bilateral 11/04/2017   Procedure: BLEPHAROPLASTY UPPER EYELID W/EXCESS SKIN;  Surgeon: Zacarias Hermann, MD;  Location: [facility];  Service: Ophthalmology;  Laterality: Bilateral;  DIABETES-insulin  dependent uses CPAP   CARDIAC CATHETERIZATION  20 yrs ago   found nothing   CARPAL TUNNEL RELEASE Bilateral    CATARACT EXTRACTION Bilateral    ELBOW SURGERY Bilateral    ESOPHAGOGASTRODUODENOSCOPY N/A 07/25/2021   Procedure: ESOPHAGOGASTRODUODENOSCOPY (EGD);  Surgeon: Toledo, Alphonsus Jeans, MD;  Location: [facility];  Service: Gastroenterology;  Laterality: N/A;  IDDM   ESOPHAGOGASTRODUODENOSCOPY N/A 01/15/2022   Procedure: ESOPHAGOGASTRODUODENOSCOPY (EGD);  Surgeon: Ozell Blunt, MD;  Location: [facility];  Service: Gastroenterology;  Laterality: N/A;  botox    ESOPHAGOGASTRODUODENOSCOPY (EGD) WITH PROPOFOL  N/A 08/28/2022   Procedure: ESOPHAGOGASTRODUODENOSCOPY (EGD) WITH PROPOFOL ;  Surgeon: Felecia Hopper, MD;  Location: [facility];  Service: Gastroenterology;  Laterality: N/A;  ESOPHAGOGASTRODUODENOSCOPY (EGD) WITH PROPOFOL  Bilateral 05/27/2023   Procedure: ESOPHAGOGASTRODUODENOSCOPY (EGD) WITH PROPOFOL ;  Surgeon: Ozell Blunt, MD;  Location: [facility];  Service: Gastroenterology;  Laterality: Bilateral;   FOOT SURGERY     Groin Abscess     HAND SURGERY     KNEE SURGERY Bilateral    LABIAL ABSCESS     LUMBAR LAMINECTOMY/DECOMPRESSION MICRODISCECTOMY Left 05/11/2018   Procedure: LUMBAR LAMINECTOMY/DECOMPRESSION  MICRODISCECTOMY 1 LEVEL- L4-5;  Surgeon: Berta Brittle, MD;  Location: [facility];  Service: Neurosurgery;  Laterality: Left;   LUMBAR LAMINECTOMY/DECOMPRESSION MICRODISCECTOMY Left 05/20/2022   Procedure: MICRODISCECTOMY L3-4;  Surgeon: Garry Kansas, MD;  Location: [facility];  Service: Neurosurgery;  Laterality: Left;  3C   LUMBAR WOUND DEBRIDEMENT N/A 08/08/2022   Procedure: INCISION AND DRAINAGE OF LUMBAR WOUND;  Surgeon: Garry Kansas, MD;  Location: [facility] OR;  Service: Neurosurgery;  Laterality: N/A;  3C   OOPHORECTOMY     BSO   PUBO VAG SLING     SHOULDER SURGERY     bilateral arthroscopies   TEE WITHOUT CARDIOVERSION N/A 07/25/2023   Procedure: TRANSESOPHAGEAL ECHOCARDIOGRAM (TEE);  Surgeon: Dorita Garter, MD;  Location: [facility];  Service: Cardiovascular;  Laterality: N/A;   VAGINAL HYSTERECTOMY  1979     Social History   Socioeconomic History   Marital status: Married    Spouse name: Not on file   Number of children: 1   Years of education: Not on file   Highest education level: Not on file  Occupational History   Not on file  Tobacco Use   Smoking status: Every Day    Current packs/day: 1.50    Average packs/day: 1.5 packs/day for 50.0 years (75.0 ttl pk-yrs)    Types: Cigarettes   Smokeless tobacco: Never   Tobacco comments:    3ppd x 10 year, then cut back to 1.5pdd since 02/2015  Vaping Use   Vaping status: Never Used  Substance and Sexual Activity   Alcohol use: Never    Alcohol/week: 0.0 standard drinks of alcohol   Drug use: No   Sexual activity: Not Currently    Birth control/protection: Surgical    Comment: Hx Hysterectomy, 1st intercourse 78 yo-Fewer than 5 partners  Other Topics Concern   Not on file  Social History Narrative   Lives at home with husband in a one story home.  Has 1 daughter.     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     Education: some college.   Social Drivers of Corporate investment banker Strain: Low Risk  (10/31/2023)    Received from [facility]   Overall Financial Resource Strain (CARDIA)    Difficulty of Paying Living Expenses: Not hard at all  Food Insecurity: No Food Insecurity (11/28/2023)   Hunger Vital Sign    Worried About Running Out of Food in the Last Year: Never true    Ran Out of Food in the Last Year: Never true  Transportation Needs: No Transportation Needs (11/28/2023)   PRAPARE - Administrator, Civil Service (Medical): No    Lack of Transportation (Non-Medical): No  Physical Activity: Not on file  Stress: Not on file  Social Connections: Unknown (11/28/2023)   Social Connection and Isolation Panel    Frequency of Communication with Friends and Family: More than three times a week    Frequency of Social Gatherings with Friends and Family: More than three times a week    Attends Religious Services:  More than 4 times per year    Active Member of Clubs or Organizations: Not on file    Attends Club or Organization Meetings: Not on file    Marital Status: Not on file  Intimate Partner Violence: Not At Risk (11/28/2023)   Humiliation, Afraid, Rape, and Kick questionnaire    Fear of Current or Ex-Partner: No    Emotionally Abused: No    Physically Abused: No    Sexually Abused: No    Family History  Problem Relation Age of Onset   Hypertension Mother    Heart disease Mother 89       MI   Diabetes Father    Diabetes Sister    Hypertension Sister    Diabetes Brother    Hypertension Brother    Heart disease Brother 42       CAD   Cancer Sister        Multiple myloma   Diabetes Brother     Current Facility-Administered Medications  Medication Dose Route Frequency Provider Last Rate Last Admin   acetaminophen  (TYLENOL ) tablet 650 mg  650 mg Oral Q6H PRN Duncan, Hazel V, MD   650 mg at 11/29/23 1148   Or   acetaminophen  (TYLENOL ) suppository 650 mg  650 mg Rectal Q6H PRN Lanetta Pion, MD       albuterol  (PROVENTIL ) (2.5 MG/3ML) 0.083% nebulizer  solution 2.5 mg  2.5 mg Nebulization Q2H PRN Duncan, Hazel V, MD   2.5 mg at 11/30/23 0739   amLODipine  (NORVASC ) tablet 10 mg  10 mg Oral Daily Lanetta Pion, MD   10 mg at 11/30/23 0933   aspirin  EC tablet 81 mg  81 mg Oral QHS Duncan, Hazel V, MD   81 mg at 11/29/23 2102   benzonatate (TESSALON) capsule 200 mg  200 mg Oral TID Alphonsus Jeans, MD   200 mg at 11/30/23 0933   diazepam  (VALIUM ) tablet 5 mg  5 mg Oral Q1500 Alphonsus Jeans, MD   5 mg at 11/29/23 1616   enoxaparin  (LOVENOX ) injection 30 mg  30 mg Subcutaneous Q24H Duncan, Hazel V, MD   30 mg at 11/30/23 0933   guaiFENesin  (MUCINEX ) 12 hr tablet 600 mg  600 mg Oral BID Lanetta Pion, MD   600 mg at 11/30/23 0933   guaiFENesin -dextromethorphan (ROBITUSSIN DM) 100-10 MG/5ML syrup 5 mL  5 mL Oral Q4H PRN Alphonsus Jeans, MD   5 mL at 11/28/23 1301   hydrALAZINE  (APRESOLINE ) injection 20 mg  20 mg Intravenous Q6H PRN Alphonsus Jeans, MD       insulin  aspart (novoLOG ) injection 0-5 Units  0-5 Units Subcutaneous QHS Duncan, Hazel V, MD   4 Units at 11/29/23 2110   insulin  aspart (novoLOG ) injection 0-9 Units  0-9 Units Subcutaneous TID WC Duncan, Hazel V, MD   5 Units at 11/30/23 0933   insulin  glargine-yfgn (SEMGLEE ) injection 10 Units  10 Units Subcutaneous BID Alphonsus Jeans, MD   10 Units at 11/30/23 0934   losartan  (COZAAR ) tablet 100 mg  100 mg Oral QHS Alphonsus Jeans, MD   100 mg at 11/29/23 2102   metoprolol  succinate (TOPROL -XL) 24 hr tablet 100 mg  100 mg Oral QHS Duncan, Hazel V, MD   100 mg at 11/29/23 2102   nicotine  (NICODERM CQ  - dosed in mg/24 hours) patch 21 mg  21 mg Transdermal Daily Alphonsus Jeans, MD   21 mg at 11/30/23 0981   ondansetron  (  ZOFRAN ) tablet 4 mg  4 mg Oral Q6H PRN Duncan, Hazel V, MD   4 mg at 11/29/23 2102   Or   ondansetron  (ZOFRAN ) injection 4 mg  4 mg Intravenous Q6H PRN Duncan, Hazel V, MD   4 mg at 11/28/23 1301   oxyCODONE -acetaminophen  (PERCOCET/ROXICET) 5-325 MG  per tablet 1 tablet  1 tablet Oral Q6H PRN Alphonsus Jeans, MD   1 tablet at 11/29/23 2103   oxyCODONE -acetaminophen  (PERCOCET/ROXICET) 5-325 MG per tablet 2 tablet  2 tablet Oral Q6H PRN Alphonsus Jeans, MD       predniSONE  (DELTASONE ) tablet 40 mg  40 mg Oral Q breakfast Lanetta Pion, MD   40 mg at 11/30/23 0933   rosuvastatin  (CRESTOR ) tablet 5 mg  5 mg Oral Daily Duncan, Hazel V, MD   5 mg at 11/30/23 1478   sodium bicarbonate  tablet 650 mg  650 mg Oral BID Duncan, Hazel V, MD   650 mg at 11/30/23 0933   sucralfate  (CARAFATE ) tablet 1 g  1 g Oral BID PRN Duncan, Hazel V, MD       zolpidem  (AMBIEN ) tablet 5 mg  5 mg Oral QHS PRN Elisabeth Guild, NP   5 mg at 11/29/23 2102    Allergies  Allergen Reactions   Iodine Anaphylaxis and Other (See Comments)    Pt states that she is allergic to ingested iodine only, okay for betadine.     Iodine I 131 Tositumomab Anaphylaxis   Shellfish Allergy Anaphylaxis   Codeine Nausea And Vomiting   Morphine  Sulfate Nausea And Vomiting   Irbesartan Other (See Comments)     Unknown  (Avapro)   Sulfa Antibiotics Other (See Comments)    Fever     REVIEW OF SYSTEMS (negative unless checked):   Cardiac:  []  Chest pain or chest pressure? [x]  Shortness of breath upon activity? [x]  Shortness of breath when lying flat? []  Irregular heart rhythm?  Vascular:  []  Pain in calf, thigh, or hip brought on by walking? []  Pain in feet at night that wakes you up from your sleep? []  Blood clot in your veins? [x]  Leg swelling?  Pulmonary:  []  Oxygen at home? []  Productive cough? []  Wheezing?  Neurologic:  []  Sudden weakness in arms or legs? []  Sudden numbness in arms or legs? []  Sudden onset of difficult speaking or slurred speech? []  Temporary loss of vision in one eye? []  Problems with dizziness?  Gastrointestinal:  []  Blood in stool? []  Vomited blood?  Genitourinary:  []  Burning when urinating? []  Blood in urine?  Psychiatric:  []   Major depression  Hematologic:  []  Bleeding problems? []  Problems with blood clotting?  Dermatologic:  []  Rashes or ulcers?  Constitutional:  []  Fever or chills?  Ear/Nose/Throat:  []  Change in hearing? []  Nose bleeds? []  Sore throat?  Musculoskeletal:  []  Back pain? []  Joint pain? []  Muscle pain?   Physical Examination     Vitals:   11/30/23 0423 11/30/23 0441 11/30/23 0740 11/30/23 0826  BP: (!) 165/65   (!) 173/56  Pulse: 70   79  Resp: 18     Temp: 97.8 F (36.6 C)   97.9 F (36.6 C)  TempSrc:      SpO2: 94%  95% 95%  Weight:  79.1 kg     Body mass index is 27.31 kg/m.  General Alert, O x 3, WD, Ill appearing  Pulmonary Sym exp, good B air movt, rales on BLL  Cardiac RRR, Nl S1,  S2, no Murmurs, No rubs, No S3,S4  Vascular Vessel Right Left  Radial Palpable Palpable  Brachial Palpable Palpable  Carotid Palpable, No Bruit Palpable, No Bruit  Aorta Not palpable N/A  Femoral Palpable Palpable  Popliteal Not palpable Not palpable  PT Faintly palpable Faintly palpable  DP Faintly palpable Faintly palpable    Musculo- skeletal M/S 5/5 throughout , Extremities without ischemic changes , minimal edema in legs  Neurologic Cranial nerves grossly intact, Pain and light touch intact in extremities, Motor exam as listed above  Psychiatric Judgement intact, Mood & affect appropriate for pt's clinical situation   Laboratory   CBC    Latest Ref Rng & Units 11/30/2023    2:35 AM 11/29/2023    6:00 AM 11/28/2023    2:46 AM  CBC  WBC 4.0 - 10.5 K/uL 14.8  14.6  11.1   Hemoglobin 12.0 - 15.0 g/dL 7.7  7.9  9.0   Hematocrit 36.0 - 46.0 % 23.1  23.6  27.4   Platelets 150 - 400 K/uL 271  253  292     BMP    Latest Ref Rng & Units 11/30/2023    2:36 AM 11/30/2023    2:35 AM 11/29/2023    6:31 PM  BMP  Glucose 70 - 99 mg/dL  086    BUN 8 - 23 mg/dL  82    Creatinine 5.78 - 1.00 mg/dL  4.69    Sodium 629 - 528 mmol/L 125  124  125   Potassium 3.5 - 5.1 mmol/L   5.3    Chloride 98 - 111 mmol/L  95    CO2 22 - 32 mmol/L  20    Calcium  8.9 - 10.3 mg/dL  8.2      Coagulation Lab Results  Component Value Date   INR 1.4 (H) 07/19/2023   INR 1.04 05/06/2018   INR 0.99 03/15/2015   No results found for: PTT  Lipids    Component Value Date/Time   CHOL 146 03/16/2015 0526   TRIG 122 11/10/2021 0530   HDL 34 (L) 03/16/2015 0526   CHOLHDL 4.3 03/16/2015 0526   VLDL 46 (H) 03/16/2015 0526   LDLCALC 66 03/16/2015 0526     Radiology     ECHOCARDIOGRAM COMPLETE Result Date: 11/29/2023    ECHOCARDIOGRAM REPORT   Patient Name:   Mckenzie Lawrence Date of Exam: 11/29/2023 Medical Rec #:  [id number]      Height:       67.0 in Accession #:    [id number]     Weight:       176.6 lb Date of Birth:  09/18/1945      BSA:          1.918 m Patient Age:    78 years       BP:           163/51 mmHg Patient Gender: F              HR:           81 bpm. Exam Location:  [facility] Procedure: 2D Echo, Cardiac Doppler and Color Doppler (Both Spectral and Color            Flow Doppler were utilized during procedure). Indications:     CHF I50.31  History:         Patient has prior history of Echocardiogram examinations, most  recent 07/23/2023.  Sonographer:     Clenton Czech RDCS, FASE Referring Phys:  [id number] Alphonsus Jeans Diagnosing Phys: Dwayne D Callwood MD IMPRESSIONS  1. Left ventricular ejection fraction, by estimation, is 55 to 60%. The left ventricle has normal function. The left ventricle has no regional wall motion abnormalities. The left ventricular internal cavity size was moderately dilated. Left ventricular diastolic parameters are consistent with Grade II diastolic dysfunction (pseudonormalization).  2. Right ventricular systolic function is normal. The right ventricular size is normal.  3. Left atrial size was mildly dilated.  4. The mitral valve is normal in structure. Trivial mitral valve regurgitation.  5. The aortic valve is normal in structure.  Aortic valve regurgitation is not visualized. FINDINGS  Left Ventricle: Left ventricular ejection fraction, by estimation, is 55 to 60%. The left ventricle has normal function. The left ventricle has no regional wall motion abnormalities. Strain was performed and the global longitudinal strain is indeterminate. The left ventricular internal cavity size was moderately dilated. There is no left ventricular hypertrophy. Left ventricular diastolic parameters are consistent with Grade II diastolic dysfunction (pseudonormalization). Right Ventricle: The right ventricular size is normal. No increase in right ventricular wall thickness. Right ventricular systolic function is normal. Left Atrium: Left atrial size was mildly dilated. Right Atrium: Right atrial size was normal in size. Pericardium: There is no evidence of pericardial effusion. Mitral Valve: The mitral valve is normal in structure. Trivial mitral valve regurgitation. Tricuspid Valve: The tricuspid valve is normal in structure. Tricuspid valve regurgitation is mild. Aortic Valve: The aortic valve is normal in structure. Aortic valve regurgitation is not visualized. Aortic valve peak gradient measures 8.8 mmHg. Pulmonic Valve: The pulmonic valve was normal in structure. Pulmonic valve regurgitation is not visualized. Aorta: The ascending aorta was not well visualized. IAS/Shunts: No atrial level shunt detected by color flow Doppler. Additional Comments: 3D was performed not requiring image post processing on an independent workstation and was indeterminate.  LEFT VENTRICLE PLAX 2D LVIDd:         5.50 cm     Diastology LVIDs:         3.80 cm     LV e' medial:    7.18 cm/s LV PW:         1.00 cm     LV E/e' medial:  19.9 LV IVS:        1.10 cm     LV e' lateral:   8.38 cm/s LVOT diam:     1.80 cm     LV E/e' lateral: 17.1 LV SV:         63 LV SV Index:   33 LVOT Area:     2.54 cm  LV Volumes (MOD) LV vol d, MOD A2C: 74.1 ml LV vol d, MOD A4C: 70.9 ml LV vol s, MOD  A2C: 37.0 ml LV vol s, MOD A4C: 28.8 ml LV SV MOD A2C:     37.1 ml LV SV MOD A4C:     70.9 ml LV SV MOD BP:      41.0 ml RIGHT VENTRICLE RV Basal diam:  3.20 cm RV S prime:     14.80 cm/s TAPSE (M-mode): 2.6 cm LEFT ATRIUM             Index        RIGHT ATRIUM           Index LA diam:        4.30 cm 2.24 cm/m   RA  Area:     19.70 cm LA Vol (A2C):   44.9 ml 23.41 ml/m  RA Volume:   55.40 ml  28.88 ml/m LA Vol (A4C):   53.8 ml 28.05 ml/m LA Biplane Vol: 51.9 ml 27.06 ml/m  AORTIC VALVE                 PULMONIC VALVE AV Area (Vmax): 1.84 cm     PV Vmax:        1.01 m/s AV Vmax:        148.00 cm/s  PV Peak grad:   4.1 mmHg AV Peak Grad:   8.8 mmHg     RVOT Peak grad: 3 mmHg LVOT Vmax:      107.00 cm/s LVOT Vmean:     76.000 cm/s LVOT VTI:       0.249 m  AORTA Ao Root diam: 2.90 cm MITRAL VALVE MV Area (PHT): 4.86 cm     SHUNTS MV Decel Time: 156 msec     Systemic VTI:  0.25 m MV E velocity: 143.00 cm/s  Systemic Diam: 1.80 cm MV A velocity: 80.30 cm/s MV E/A ratio:  1.78 Dwayne D Callwood MD Electronically signed by Antonette Batters MD Signature Date/Time: 11/29/2023/10:33:04 AM    Final      Medical Decision Making   Mckenzie Lawrence is a 78 y.o. female who presents with: imminent ESRD  Based on the patient's vascular studies and examination, I have offered the patient: tunneled dialysis catheter placement. The patient is aware the risks of tunneled dialysis catheter placement include but are not limited to: bleeding, infection, central venous injury, pneumothorax, possible venous stenosis, possible malpositioning in the venous system, and possible infections related to long-term catheter presence.  The patient was aware of these risks and agreed to proceed. Pt will be scheduled with Dr. Vonna Guardian or Dr. Alfonse Iha tomorrow. Thank you for allowing us  to participate in this patient's care.   Roxy Cordial, MD, FACS, FSVS Covering for [facility]: [phone]  11/30/2023, 12:00  PM

## 2023-12-01 ENCOUNTER — Encounter: Payer: Self-pay | Admitting: Vascular Surgery

## 2023-12-01 ENCOUNTER — Encounter
Admission: EM | Disposition: A | Discharge status: Skilled Nursing Facility | Payer: Self-pay | Source: Home / Self Care | Attending: Obstetrics and Gynecology

## 2023-12-01 DIAGNOSIS — I5031 Acute diastolic (congestive) heart failure: Secondary | ICD-10-CM | POA: Diagnosis not present

## 2023-12-01 DIAGNOSIS — Z992 Dependence on renal dialysis: Secondary | ICD-10-CM

## 2023-12-01 DIAGNOSIS — N186 End stage renal disease: Secondary | ICD-10-CM

## 2023-12-01 DIAGNOSIS — E871 Hypo-osmolality and hyponatremia: Secondary | ICD-10-CM | POA: Diagnosis not present

## 2023-12-01 HISTORY — PX: DIALYSIS/PERMA CATHETER INSERTION: CATH118288

## 2023-12-01 LAB — CBC
HCT: 22.6 % — ABNORMAL LOW (ref 36.0–46.0)
Hemoglobin: 7.4 g/dL — ABNORMAL LOW (ref 12.0–15.0)
MCH: 30 pg (ref 26.0–34.0)
MCHC: 32.7 g/dL (ref 30.0–36.0)
MCV: 91.5 fL (ref 80.0–100.0)
Platelets: 236 10*3/uL (ref 150–400)
RBC: 2.47 MIL/uL — ABNORMAL LOW (ref 3.87–5.11)
RDW: 14.6 % (ref 11.5–15.5)
WBC: 14.5 10*3/uL — ABNORMAL HIGH (ref 4.0–10.5)
nRBC: 0 % (ref 0.0–0.2)

## 2023-12-01 LAB — GLUCOSE, CAPILLARY
Glucose-Capillary: 281 mg/dL — ABNORMAL HIGH (ref 70–99)
Glucose-Capillary: 202 mg/dL — ABNORMAL HIGH (ref 70–99)
Glucose-Capillary: 180 mg/dL — ABNORMAL HIGH (ref 70–99)
Glucose-Capillary: 156 mg/dL — ABNORMAL HIGH (ref 70–99)
Glucose-Capillary: 246 mg/dL — ABNORMAL HIGH (ref 70–99)
Glucose-Capillary: 359 mg/dL — ABNORMAL HIGH (ref 70–99)

## 2023-12-01 LAB — BASIC METABOLIC PANEL WITH GFR
Anion gap: 10 (ref 5–15)
BUN: 82 mg/dL — ABNORMAL HIGH (ref 8–23)
CO2: 20 mmol/L — ABNORMAL LOW (ref 22–32)
Calcium: 8.4 mg/dL — ABNORMAL LOW (ref 8.9–10.3)
Chloride: 97 mmol/L — ABNORMAL LOW (ref 98–111)
Creatinine, Ser: 4.02 mg/dL — ABNORMAL HIGH (ref 0.44–1.00)
GFR, Estimated: 11 mL/min — ABNORMAL LOW (ref >60)
Glucose, Bld: 237 mg/dL — ABNORMAL HIGH (ref 70–99)
Potassium: 5.3 mmol/L — ABNORMAL HIGH (ref 3.5–5.1)
Sodium: 127 mmol/L — ABNORMAL LOW (ref 135–145)

## 2023-12-01 SURGERY — DIALYSIS/PERMA CATHETER INSERTION
Anesthesia: Moderate Sedation

## 2023-12-01 MED ORDER — FENTANYL CITRATE PF 50 MCG/ML IJ SOSY
25.0000 ug | PREFILLED_SYRINGE | Freq: Once | INTRAMUSCULAR | Status: AC
  Administered 2023-12-02: 25 ug via INTRAVENOUS
  Filled 2023-12-01: qty 1

## 2023-12-01 MED ORDER — LIDOCAINE-EPINEPHRINE (PF) 1 %-1:200000 IJ SOLN: INTRAMUSCULAR | Status: DC | PRN

## 2023-12-01 MED ORDER — CEFAZOLIN SODIUM-DEXTROSE 1-4 GM/50ML-% IV SOLN
INTRAVENOUS | Status: DC | PRN
  Administered 2023-12-01: 1 g via INTRAVENOUS

## 2023-12-01 MED ORDER — MIDAZOLAM HCL 2 MG/2ML IJ SOLN
INTRAMUSCULAR | Status: AC
  Filled 2023-12-01: qty 2

## 2023-12-01 MED ORDER — ANTICOAGULANT SODIUM CITRATE 4% (200MG/5ML) IV SOLN: 5.0000 mL | Status: DC | PRN

## 2023-12-01 MED ORDER — HEPARIN SODIUM (PORCINE) 10000 UNIT/ML IJ SOLN
INTRAMUSCULAR | Status: DC | PRN
  Administered 2023-12-01: 10000 [IU]

## 2023-12-01 MED ORDER — HEPARIN (PORCINE) IN NACL 1000-0.9 UT/500ML-% IV SOLN
INTRAVENOUS | Status: DC | PRN
  Administered 2023-12-01: 500 mL

## 2023-12-01 MED ORDER — PROCHLORPERAZINE EDISYLATE 10 MG/2ML IJ SOLN
10.0000 mg | Freq: Once | INTRAMUSCULAR | Status: AC
  Administered 2023-12-02: 10 mg via INTRAVENOUS
  Filled 2023-12-01: qty 2

## 2023-12-01 MED ORDER — CEFAZOLIN SODIUM-DEXTROSE 1-4 GM/50ML-% IV SOLN
INTRAVENOUS | Status: AC
Start: 2023-12-01 — End: 2023-12-01
  Filled 2023-12-01: qty 50

## 2023-12-01 MED ORDER — HEPARIN SODIUM (PORCINE) 1000 UNIT/ML DIALYSIS: 1000.0000 [IU] | INTRAMUSCULAR | Status: DC | PRN

## 2023-12-01 MED ORDER — FENTANYL CITRATE (PF) 100 MCG/2ML IJ SOLN
INTRAMUSCULAR | Status: DC | PRN
  Administered 2023-12-01: 25 ug via INTRAVENOUS
  Administered 2023-12-01: 50 ug via INTRAVENOUS

## 2023-12-01 MED ORDER — MIDAZOLAM HCL 2 MG/2ML IJ SOLN
INTRAMUSCULAR | Status: DC | PRN
  Administered 2023-12-01: 1 mg via INTRAVENOUS
  Administered 2023-12-01: 2 mg via INTRAVENOUS

## 2023-12-01 MED ORDER — CEFAZOLIN SODIUM-DEXTROSE 1-4 GM/50ML-% IV SOLN: 1.0000 g | INTRAVENOUS | Status: DC

## 2023-12-01 MED ORDER — FENTANYL CITRATE (PF) 100 MCG/2ML IJ SOLN
INTRAMUSCULAR | Status: AC
Start: 2023-12-01 — End: 2023-12-01
  Filled 2023-12-01: qty 2

## 2023-12-01 MED ORDER — DIAZEPAM 5 MG PO TABS
10.0000 mg | ORAL_TABLET | Freq: Every day | ORAL | Status: DC
  Administered 2023-12-01 – 2023-12-03 (×3): 10 mg via ORAL
  Filled 2023-12-01 (×2): qty 2

## 2023-12-01 MED ORDER — SODIUM CHLORIDE 0.9 % IV SOLN
INTRAVENOUS | Status: DC
Start: 2023-12-01 — End: 2023-12-01

## 2023-12-01 SURGICAL SUPPLY — 6 items
BIOPATCH RED 1 DISK 7.0 (GAUZE/BANDAGES/DRESSINGS) IMPLANT
CATH CANNON HEMO 15FR 19 (HEMODIALYSIS SUPPLIES) IMPLANT
DERMABOND ADVANCED .7 DNX12 (GAUZE/BANDAGES/DRESSINGS) IMPLANT
PACK ANGIOGRAPHY (CUSTOM PROCEDURE TRAY) ×2 IMPLANT
SUT MNCRL AB 4-0 PS2 18 (SUTURE) IMPLANT
SUT PROLENE 0 CT 1 30 (SUTURE) IMPLANT

## 2023-12-01 NOTE — Progress Notes (Signed)
 [facility] CLINIC CARDIOLOGY PROGRESS NOTE       Patient ID: Mckenzie Lawrence MRN: [id number] DOB/AGE: 21-Oct-1945 78 y.o.  Admit date: 11/28/2023 Referring Physician Dr. Brion Cancel Primary Physician Lyle San, MD Primary Cardiologist Dr. Parks Bollman Reason for Consultation diastolic heart failure with SOB.  HPI: Mckenzie Lawrence is a 78 y.o. female  with a past medical history of coronary artery disease, hypertension, hyperlipidemia, COPD, OSA, hx CVA, CKD stage 4, SIADH, diabetes mellitus who presented to the ED on 11/28/2023 for worsening and persistent SOB and lower extremity swelling with concurrent COPD exacerbation and found elevated BNP of 2500. Cardiology was consulted for further evaluation.   Interval History: -Patient seen and examined this AM and laying comfortably in hospital bed at a slight. Patient states she feels a lot better compared to yesterday. Reports SOB and LEE is improving and denies chest pain.  -Patients BP elevated and HR stable. Overnight Tele showed no significant events.  -Na 127, K 5.3, Cr 4.02 this AM -Patient remains on room air with stable SpO2.  -Plan for Permacath placement by Dr. Vonna Guardian today and plan for 2 hours of dialysis today for ESRD.   Review of systems complete and found to be negative unless listed above    Past Medical History:  Diagnosis Date   Anemia    Arthritis    Bladder incontinence    Broken foot    Cataracts, bilateral    Chronic kidney insufficiency    stage 3b   COPD (chronic obstructive pulmonary disease) (HCC)    wheezing   Coronary artery disease 04/25/2022   in [place]   COVID 2021   very mild case   CVA (cerebral vascular accident) (HCC) 2016   has had 3 strokes, states right side is slightlyweaker than left   Diabetes mellitus    insulin  dependent, Type 2   GERD (gastroesophageal reflux disease)    HLD (hyperlipidemia)    Hx of cardiovascular stress test    a. ETT (6/13):  Ex 5:13; no ischemic changes    Hypertension    controlled on meds   Lacunar stroke of left subthalamic region [facility]) 02/2015   Leg pain    left   Lower back pain    Neuromuscular disorder (HCC)    stroke right hand tingling   Orthostatic hypotension    Osteopenia 01/2017   T score -2.0 stable from prior DEXA   Pancreatitis 10/2021   PCOS (polycystic ovarian syndrome)    Personal history of tobacco use, presenting hazards to health 01/09/2015   PONV (postoperative nausea and vomiting)    Sleep apnea    uses CPAP nightly    Past Surgical History:  Procedure Laterality Date   BIOPSY  08/28/2022   Procedure: BIOPSY;  Surgeon: Felecia Hopper, MD;  Location: [facility];  Service: Gastroenterology;;   BOTOX  INJECTION  01/15/2022   Procedure: BOTOX  INJECTION;  Surgeon: Ozell Blunt, MD;  Location: [facility];  Service: Gastroenterology;;   BOTOX  INJECTION N/A 08/28/2022   Procedure: BOTOX  INJECTION;  Surgeon: Felecia Hopper, MD;  Location: [facility];  Service: Gastroenterology;  Laterality: N/A;   BOTOX  INJECTION Bilateral 05/27/2023   Procedure: BOTOX  INJECTION;  Surgeon: Ozell Blunt, MD;  Location: [facility];  Service: Gastroenterology;  Laterality: Bilateral;   BROW LIFT Bilateral 11/04/2017   Procedure: BLEPHAROPLASTY UPPER EYELID W/EXCESS SKIN;  Surgeon: Zacarias Hermann, MD;  Location[facility];  Service: Ophthalmology;  Laterality: Bilateral;  DIABETES-insulin  dependent uses CPAP   CARDIAC  CATHETERIZATION  20 yrs ago   found nothing   CARPAL TUNNEL RELEASE Bilateral    CATARACT EXTRACTION Bilateral    ELBOW SURGERY Bilateral    ESOPHAGOGASTRODUODENOSCOPY N/A 07/25/2021   Procedure: ESOPHAGOGASTRODUODENOSCOPY (EGD);  Surgeon: Toledo, Alphonsus Jeans, MD;  Location: [facility]COPY;  Service: Gastroenterology;  Laterality: N/A;  IDDM   ESOPHAGOGASTRODUODENOSCOPY N/A 01/15/2022   Procedure: ESOPHAGOGASTRODUODENOSCOPY (EGD);  Surgeon: Ozell Blunt, MD;  Location: [facility];  Service: Gastroenterology;   Laterality: N/A;  botox    ESOPHAGOGASTRODUODENOSCOPY (EGD) WITH PROPOFOL  N/A 08/28/2022   Procedure: ESOPHAGOGASTRODUODENOSCOPY (EGD) WITH PROPOFOL ;  Surgeon: Felecia Hopper, MD;  Location: [facility];  Service: Gastroenterology;  Laterality: N/A;   ESOPHAGOGASTRODUODENOSCOPY (EGD) WITH PROPOFOL  Bilateral 05/27/2023   Procedure: ESOPHAGOGASTRODUODENOSCOPY (EGD) WITH PROPOFOL ;  Surgeon: Ozell Blunt, MD;  Location: [facility];  Service: Gastroenterology;  Laterality: Bilateral;   FOOT SURGERY     Groin Abscess     HAND SURGERY     KNEE SURGERY Bilateral    LABIAL ABSCESS     LUMBAR LAMINECTOMY/DECOMPRESSION MICRODISCECTOMY Left 05/11/2018   Procedure: LUMBAR LAMINECTOMY/DECOMPRESSION MICRODISCECTOMY 1 LEVEL- L4-5;  Surgeon: Berta Brittle, MD;  Location: [facility] ORS;  Service: Neurosurgery;  Laterality: Left;   LUMBAR LAMINECTOMY/DECOMPRESSION MICRODISCECTOMY Left 05/20/2022   Procedure: MICRODISCECTOMY L3-4;  Surgeon: Garry Kansas, MD;  Location: [facility] OR;  Service: Neurosurgery;  Laterality: Left;  3C   LUMBAR WOUND DEBRIDEMENT N/A 08/08/2022   Procedure: INCISION AND DRAINAGE OF LUMBAR WOUND;  Surgeon: Garry Kansas, MD;  Location: [facility] OR;  Service: Neurosurgery;  Laterality: N/A;  3C   OOPHORECTOMY     BSO   PUBO VAG SLING     SHOULDER SURGERY     bilateral arthroscopies   TEE WITHOUT CARDIOVERSION N/A 07/25/2023   Procedure: TRANSESOPHAGEAL ECHOCARDIOGRAM (TEE);  Surgeon: Dorita Garter, MD;  Location: [facility] ORS;  Service: Cardiovascular;  Laterality: N/A;   VAGINAL HYSTERECTOMY  1979    Medications Prior to Admission  Medication Sig Dispense Refill Last Dose/Taking   acetaminophen  (TYLENOL ) 500 MG tablet Take 1,000 mg by mouth every 6 (six) hours as needed for moderate pain.   Unknown   albuterol  (VENTOLIN  HFA) 108 (90 Base) MCG/ACT inhaler Inhale 2 puffs into the lungs every 6 (six) hours as needed for wheezing or shortness of breath.   Unknown   aspirin  EC 81 MG tablet Take 81 mg  by mouth at bedtime. Swallow whole.   Taking   calcitRIOL  (ROCALTROL ) 0.25 MCG capsule Take 0.25 mcg by mouth at bedtime.   Taking   cholecalciferol (VITAMIN D3) 25 MCG (1000 UNIT) tablet Take 1,000 Units by mouth daily.   Taking   cyclobenzaprine  (FLEXERIL ) 10 MG tablet Take 1 tablet (10 mg total) by mouth 3 (three) times daily as needed for muscle spasms. 30 tablet 0 Taking As Needed   D-MANNOSE PO Take 2 tablets by mouth at bedtime.   Taking   diazepam  (VALIUM ) 5 MG tablet Take 5 mg by mouth 2 (two) times daily as needed.   Taking As Needed   diphenhydrAMINE (BENADRYL) 25 MG tablet Take 50 mg by mouth at bedtime as needed for sleep.   Taking As Needed   docusate sodium  (COLACE) 100 MG capsule Take 1 capsule (100 mg total) by mouth 2 (two) times daily. (Patient taking differently: Take 100 mg by mouth at bedtime as needed for mild constipation or moderate constipation.) 30 capsule 0 Taking Differently   estradiol  (ESTRACE ) 0.1 MG/GM vaginal cream Place 1 Applicatorful vaginally 3 (three) times a  week.   Taking   Fe Fum-Vit C-Vit B12-FA (TRIGELS-F FORTE) CAPS capsule Take 1 capsule by mouth daily after breakfast. 90 capsule 0 Taking   insulin  glargine-yfgn (SEMGLEE ) 100 UNIT/ML Pen Inject 10 Units into the skin 2 (two) times daily.   Taking   losartan  (COZAAR ) 100 MG tablet Take 1 tablet (100 mg total) by mouth daily. (Patient taking differently: Take 100 mg by mouth at bedtime.) 30 tablet 2 Taking Differently   metoprolol  succinate (TOPROL -XL) 100 MG 24 hr tablet Take 100 mg by mouth at bedtime.   Taking   Multiple Vitamins-Minerals (PRESERVISION AREDS 2+MULTI VIT PO) Take 1 capsule by mouth daily.   Taking   nitrofurantoin  (MACRODANTIN ) 100 MG capsule Take 100 mg by mouth at bedtime.   Taking   rosuvastatin  (CRESTOR ) 5 MG tablet Take 5 mg by mouth daily.   Taking   sennosides-docusate sodium  (SENOKOT-S) 8.6-50 MG tablet Take 1 tablet by mouth daily as needed for constipation.   Taking As Needed    sodium bicarbonate  650 MG tablet Take 1 tablet (650 mg total) by mouth 2 (two) times daily. (Patient taking differently: Take 650 mg by mouth daily.) 100 tablet 1 Taking Differently   sucralfate  (CARAFATE ) 1 g tablet Take 1 tablet (1 g total) by mouth 2 (two) times daily as needed (acid reflux).   Taking As Needed   Vibegron (GEMTESA) 75 MG TABS Take 75 mg by mouth at bedtime.   Taking   zolpidem  (AMBIEN ) 10 MG tablet Take 10 mg by mouth at bedtime as needed for sleep.   Taking As Needed   amLODipine  (NORVASC ) 10 MG tablet Take 1 tablet (10 mg total) by mouth daily. (Patient not taking: Reported on 11/28/2023) 30 tablet 2 Not Taking   BD INSULIN  SYRINGE U/F 31G X 5/16 1 ML MISC USE 1 SYRINGE AS DIRECTED      Continuous Glucose Sensor (FREESTYLE LIBRE 2 SENSOR) MISC       epoetin  alfa-epbx (RETACRIT ) 4000 UNIT/ML injection Inject 1 mL (4,000 Units total) into the skin once a week. (Patient not taking: Reported on 11/28/2023) 4 mL 3 Not Taking   feeding supplement (BOOST HIGH PROTEIN) LIQD Take 1 Container by mouth 2 (two) times daily between meals.      insulin  detemir (LEVEMIR ) 100 UNIT/ML injection Inject 100-120 Units into the skin daily. (Patient not taking: Reported on 11/28/2023)   Not Taking   nystatin  (MYCOSTATIN ) 100000 UNIT/ML suspension Take 5 mLs (500,000 Units total) by mouth 4 (four) times daily. (Patient not taking: Reported on 11/28/2023) 120 mL 1 Not Taking   ONETOUCH ULTRA test strip 4 (four) times daily.      Social History   Socioeconomic History   Marital status: Married    Spouse name: Not on file   Number of children: 1   Years of education: Not on file   Highest education level: Not on file  Occupational History   Not on file  Tobacco Use   Smoking status: Every Day    Current packs/day: 1.50    Average packs/day: 1.5 packs/day for 50.0 years (75.0 ttl pk-yrs)    Types: Cigarettes   Smokeless tobacco: Never   Tobacco comments:    3ppd x 10 year, then cut back to  1.5pdd since 02/2015  Vaping Use   Vaping status: Never Used  Substance and Sexual Activity   Alcohol use: Never    Alcohol/week: 0.0 standard drinks of alcohol   Drug use: No   Sexual  activity: Not Currently    Birth control/protection: Surgical    Comment: Hx Hysterectomy, 1st intercourse 78 yo-Fewer than 5 partners  Other Topics Concern   Not on file  Social History Narrative   Lives at home with husband in a one story home.  Has 1 daughter.     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     Education: some college.   Social Drivers of Corporate investment banker Strain: Low Risk  (10/31/2023)   Received from [facility]   Overall Financial Resource Strain (CARDIA)    Difficulty of Paying Living Expenses: Not hard at all  Food Insecurity: No Food Insecurity (11/28/2023)   Hunger Vital Sign    Worried About Running Out of Food in the Last Year: Never true    Ran Out of Food in the Last Year: Never true  Transportation Needs: No Transportation Needs (11/28/2023)   PRAPARE - Administrator, Civil Service (Medical): No    Lack of Transportation (Non-Medical): No  Physical Activity: Not on file  Stress: Not on file  Social Connections: Unknown (11/28/2023)   Social Connection and Isolation Panel    Frequency of Communication with Friends and Family: More than three times a week    Frequency of Social Gatherings with Friends and Family: More than three times a week    Attends Religious Services: More than 4 times per year    Active Member of [employer] or Organizations: Not on file    Attends Banker Meetings: Not on file    Marital Status: Not on file  Intimate Partner Violence: Not At Risk (11/28/2023)   Humiliation, Afraid, Rape, and Kick questionnaire    Fear of Current or Ex-Partner: No    Emotionally Abused: No    Physically Abused: No    Sexually Abused: No    Family History  Problem Relation Age of Onset   Hypertension  Mother    Heart disease Mother 27       MI   Diabetes Father    Diabetes Sister    Hypertension Sister    Diabetes Brother    Hypertension Brother    Heart disease Brother 17       CAD   Cancer Sister        Multiple myloma   Diabetes Brother      Vitals:   12/01/23 1054 12/01/23 1059 12/01/23 1104 12/01/23 1114  BP: 138/85 (!) 157/86 (!) 162/62 (!) 161/62  Pulse: 78 76 78   Resp: (!) 9 13 14  (!) 23  Temp:    97.7 F (36.5 C)  TempSrc:    Oral  SpO2: 100% 100% 100%   Weight:        PHYSICAL EXAM General: well appearing elderly female, well nourished, in no acute distress. HEENT: Normocephalic and atraumatic. Neck: No JVD.   Lungs: Normal respiratory effort on room air. Diminished breath sounds bilaterally, bibasilar crackles.  Heart: HRRR. Normal S1 and S2 without gallops or murmurs.  Abdomen: Non-distended appearing.  Msk: Normal strength and tone for age. Extremities: Warm and well perfused. No clubbing, cyanosis. 1+ pitting edema bilaterally.  Neuro: Alert and oriented X 3. Psych: Answers questions appropriately.   Labs: Basic Metabolic Panel: Recent Labs    11/30/23 0235 11/30/23 0236 12/01/23 0416  NA 124* 125* 127*  K 5.3*  --  5.3*  CL 95*  --  97*  CO2 20*  --  20*  GLUCOSE 312*  --  237*  BUN 82*  --  82*  CREATININE 4.09*  --  4.02*  CALCIUM  8.2*  --  8.4*   Liver Function Tests: Recent Labs    11/28/23 1620  AST 22  ALT 11  ALKPHOS 88  BILITOT 0.7  PROT 6.2*  ALBUMIN 2.6*   No results for input(s): LIPASE, AMYLASE in the last 72 hours. CBC: Recent Labs    11/30/23 0235 12/01/23 0416  WBC 14.8* 14.5*  HGB 7.7* 7.4*  HCT 23.1* 22.6*  MCV 90.9 91.5  PLT 271 236   Cardiac Enzymes: No results for input(s): CKTOTAL, CKMB, CKMBINDEX, TROPONINIHS in the last 72 hours. BNP: No results for input(s): BNP in the last 72 hours. D-Dimer: No results for input(s): DDIMER in the last 72 hours. Hemoglobin A1C: No results  for input(s): HGBA1C in the last 72 hours. Fasting Lipid Panel: No results for input(s): CHOL, HDL, LDLCALC, TRIG, CHOLHDL, LDLDIRECT in the last 72 hours. Thyroid Function Tests: No results for input(s): TSH, T4TOTAL, T3FREE, THYROIDAB in the last 72 hours.  Invalid input(s): FREET3 Anemia Panel: No results for input(s): VITAMINB12, FOLATE, FERRITIN, TIBC, IRON, RETICCTPCT in the last 72 hours.   Radiology: PERIPHERAL VASCULAR CATHETERIZATION Result Date: 12/01/2023 See surgical note for result.  ECHOCARDIOGRAM COMPLETE Result Date: 11/29/2023    ECHOCARDIOGRAM REPORT   Patient Name:   Mckenzie Lawrence Date of Exam: 11/29/2023 Medical Rec #:  [id number]      Height:       67.0 in Accession #:    [id number]     Weight:       176.6 lb Date of Birth:  1946-01-29      BSA:          1.918 m Patient Age:    78 years       BP:           163/51 mmHg Patient Gender: F              HR:           81 bpm. Exam Location:  [facility] Procedure: 2D Echo, Cardiac Doppler and Color Doppler (Both Spectral and Color            Flow Doppler were utilized during procedure). Indications:     CHF I50.31  History:         Patient has prior history of Echocardiogram examinations, most                  recent 07/23/2023.  Sonographer:     Clenton Czech RDCS, FASE Referring Phys:  [id number] Alphonsus Jeans Diagnosing Phys: Dwayne D Callwood MD IMPRESSIONS  1. Left ventricular ejection fraction, by estimation, is 55 to 60%. The left ventricle has normal function. The left ventricle has no regional wall motion abnormalities. The left ventricular internal cavity size was moderately dilated. Left ventricular diastolic parameters are consistent with Grade II diastolic dysfunction (pseudonormalization).  2. Right ventricular systolic function is normal. The right ventricular size is normal.  3. Left atrial size was mildly dilated.  4. The mitral valve is normal in structure. Trivial mitral valve  regurgitation.  5. The aortic valve is normal in structure. Aortic valve regurgitation is not visualized. FINDINGS  Left Ventricle: Left ventricular ejection fraction, by estimation, is 55 to 60%. The left ventricle has normal function. The left ventricle has no regional wall motion abnormalities. Strain was performed and the global longitudinal strain is  indeterminate. The left ventricular internal cavity size was moderately dilated. There is no left ventricular hypertrophy. Left ventricular diastolic parameters are consistent with Grade II diastolic dysfunction (pseudonormalization). Right Ventricle: The right ventricular size is normal. No increase in right ventricular wall thickness. Right ventricular systolic function is normal. Left Atrium: Left atrial size was mildly dilated. Right Atrium: Right atrial size was normal in size. Pericardium: There is no evidence of pericardial effusion. Mitral Valve: The mitral valve is normal in structure. Trivial mitral valve regurgitation. Tricuspid Valve: The tricuspid valve is normal in structure. Tricuspid valve regurgitation is mild. Aortic Valve: The aortic valve is normal in structure. Aortic valve regurgitation is not visualized. Aortic valve peak gradient measures 8.8 mmHg. Pulmonic Valve: The pulmonic valve was normal in structure. Pulmonic valve regurgitation is not visualized. Aorta: The ascending aorta was not well visualized. IAS/Shunts: No atrial level shunt detected by color flow Doppler. Additional Comments: 3D was performed not requiring image post processing on an independent workstation and was indeterminate.  LEFT VENTRICLE PLAX 2D LVIDd:         5.50 cm     Diastology LVIDs:         3.80 cm     LV e' medial:    7.18 cm/s LV PW:         1.00 cm     LV E/e' medial:  19.9 LV IVS:        1.10 cm     LV e' lateral:   8.38 cm/s LVOT diam:     1.80 cm     LV E/e' lateral: 17.1 LV SV:         63 LV SV Index:   33 LVOT Area:     2.54 cm  LV Volumes (MOD) LV vol  d, MOD A2C: 74.1 ml LV vol d, MOD A4C: 70.9 ml LV vol s, MOD A2C: 37.0 ml LV vol s, MOD A4C: 28.8 ml LV SV MOD A2C:     37.1 ml LV SV MOD A4C:     70.9 ml LV SV MOD BP:      41.0 ml RIGHT VENTRICLE RV Basal diam:  3.20 cm RV S prime:     14.80 cm/s TAPSE (M-mode): 2.6 cm LEFT ATRIUM             Index        RIGHT ATRIUM           Index LA diam:        4.30 cm 2.24 cm/m   RA Area:     19.70 cm LA Vol (A2C):   44.9 ml 23.41 ml/m  RA Volume:   55.40 ml  28.88 ml/m LA Vol (A4C):   53.8 ml 28.05 ml/m LA Biplane Vol: 51.9 ml 27.06 ml/m  AORTIC VALVE                 PULMONIC VALVE AV Area (Vmax): 1.84 cm     PV Vmax:        1.01 m/s AV Vmax:        148.00 cm/s  PV Peak grad:   4.1 mmHg AV Peak Grad:   8.8 mmHg     RVOT Peak grad: 3 mmHg LVOT Vmax:      107.00 cm/s LVOT Vmean:     76.000 cm/s LVOT VTI:       0.249 m  AORTA Ao Root diam: 2.90 cm MITRAL VALVE MV Area (PHT): 4.86 cm  SHUNTS MV Decel Time: 156 msec     Systemic VTI:  0.25 m MV E velocity: 143.00 cm/s  Systemic Diam: 1.80 cm MV A velocity: 80.30 cm/s MV E/A ratio:  1.78 Dwayne Charlett Conroy MD Electronically signed by Antonette Batters MD Signature Date/Time: 11/29/2023/10:33:04 AM    Final    US  Venous Img Lower Bilateral Result Date: 11/28/2023 CLINICAL DATA:  New onset left greater than right lower extremity swelling, without findings of CHF. EXAM: BILATERAL LOWER EXTREMITY VENOUS DOPPLER ULTRASOUND TECHNIQUE: Gray-scale sonography with graded compression, as well as color Doppler and duplex ultrasound were performed to evaluate the lower extremity deep venous systems from the level of the common femoral vein and including the common femoral, femoral, profunda femoral, popliteal and calf veins including the posterior tibial, peroneal and gastrocnemius veins when visible. The superficial great saphenous vein was also interrogated. Spectral Doppler was utilized to evaluate flow at rest and with distal augmentation maneuvers in the common femoral,  femoral and popliteal veins. COMPARISON:  Left lower extremity DVT exam 08/26/2019. FINDINGS: RIGHT LOWER EXTREMITY Common Femoral Vein: No evidence of thrombus. Normal compressibility, respiratory phasicity and response to augmentation. Saphenofemoral Junction: No evidence of thrombus. Normal compressibility and flow on color Doppler imaging. Profunda Femoral Vein: No evidence of thrombus. Normal compressibility and flow on color Doppler imaging. Femoral Vein: No evidence of thrombus. Normal compressibility, respiratory phasicity and response to augmentation. Popliteal Vein: No evidence of thrombus. Normal compressibility, respiratory phasicity and response to augmentation. Calf Veins: No evidence of thrombus. Normal compressibility and flow on color Doppler imaging. Superficial Great Saphenous Vein: No evidence of thrombus. Normal compressibility. Venous Reflux:  None. Other Findings:  Pulsatile venous waveforms. LEFT LOWER EXTREMITY Common Femoral Vein: No evidence of thrombus. Normal compressibility, respiratory phasicity and response to augmentation. Saphenofemoral Junction: No evidence of thrombus. Normal compressibility and flow on color Doppler imaging. Profunda Femoral Vein: No evidence of thrombus. Normal compressibility and flow on color Doppler imaging. Femoral Vein: No evidence of thrombus. Normal compressibility, respiratory phasicity and response to augmentation. Popliteal Vein: No evidence of thrombus. Normal compressibility, respiratory phasicity and response to augmentation. Calf Veins: No evidence of thrombus. Normal compressibility and flow on color Doppler imaging. Superficial Great Saphenous Vein: No evidence of thrombus. Normal compressibility. Venous Reflux:  None. Other Findings:  Pulsatile venous waveforms. IMPRESSION: 1. No evidence of deep venous thrombosis in either lower extremity. 2. Pulsatile venous waveforms, which can be seen with right heart failure or tricuspid regurgitation.  Electronically Signed   By: Denman Fischer M.D.   On: 11/28/2023 04:56   DG Chest Portable 1 View Result Date: 11/28/2023 CLINICAL DATA:  Difficulty breathing EXAM: PORTABLE CHEST 1 VIEW COMPARISON:  07/19/2023 FINDINGS: Cardiac shadow is mildly enlarged. Aortic calcifications are seen. Mild vascular congestion is noted. Small right-sided pleural effusion is noted. Metallic densities are noted symmetrically over the upper chest likely artifactual in nature. IMPRESSION: Small right pleural effusion and vascular congestion. Electronically Signed   By: Violeta Grey M.D.   On: 11/28/2023 02:59    ECHO as above  TELEMETRY reviewed by me 12/01/2023: sinus rhythm with PACs, rate 70s  EKG reviewed by me: Normal sinus rhythm, mild right axis nonspecific ST-T wave changes   Data reviewed by me 12/01/2023: last 24h vitals tele labs imaging I/O ED provider note, admission H&P.  Principal Problem:   Hypertensive urgency/emergency Active Problems:   Hyponatremia   CAD (coronary artery disease)   COPD with acute exacerbation (HCC)  Acute renal failure superimposed on stage 4 chronic kidney disease (HCC)   Chest pain   Hypertensive emergency   Acute dyspnea    ASSESSMENT AND PLAN:  Mckenzie Lawrence is a 78 y.o. female  with a past medical history of coronary artery disease, hypertension, hyperlipidemia, COPD, OSA, hx CVA, CKD stage 4, SIADH, diabetes mellitus who presented to the ED on 11/28/2023 for worsening and persistent SOB and lower extremity swelling with concurrent COPD exacerbation and found elevated BNP of 2500. Cardiology was consulted for further evaluation.   # ESRD (Plan to start dialysis on 06/16) Plan for Permacath placement today by Dr. Vonna Guardian and 2 hours of dialysis planned after. -Nephrology following, appreciate recommendations.   # Acute HFpEF exacerbation # COPD exacerbation  # OSA Patient presents with worsening SOB and LE swelling. BNP elevated at 2450. Echo this admission with  pEF, no RWMA, grade II diastolic dysfunction.  -Continue metoprolol  succinate 100 mg daily.  -Continue losartan  as stated below.  -Fluid removal via dialysis.  -GDMT optimization limited due to ESRD.  -Recommend sleep study for OSA if CPAP indicated.  -Recommend breathing treatments and frequent use of incentive spirometry for COPD. Management per primary.  # Coronary artery disease # Hypertension # Hyperlipidemia # Demand ischemia Patient without chest pain. Trops minimally elevated and flat 19 > 20. EKG without acute ischemic changes. Echo with no RWMA.   -Continue ASA 81 mg, rosuvastatin  5 mg daily -Continue amlodipine  10 mg daily.  -Continue losartan  100 mg daily. -Continue metoprolol  as stated above. -Minimally elevated and flat trop sin setting of ESRD, heart failure exacerbation is most consistent with demand/supply mismatch and not ACS.  This patient's plan of care was discussed and created with Dr. Beau Bound and he is in agreement.  Signed: Creighton Doffing, PA-C  12/01/2023, 11:37 AM [facility]

## 2023-12-01 NOTE — Progress Notes (Addendum)
 \ PROGRESS NOTE    Mckenzie Lawrence  ZOX:[id number] DOB: 24-Feb-1946 DOA: 11/28/2023 PCP: Lyle San, MD   Assessment & Plan:   Principal Problem:   Hypertensive urgency/emergency Active Problems:   COPD with acute exacerbation (HCC)   Acute dyspnea   CAD (coronary artery disease)   Chest pain   Acute renal failure superimposed on stage 4 chronic kidney disease (HCC)   Hyponatremia   Hypertensive emergency  Assessment and Plan: Hypertensive urgency/emergency: resolved emergency but still w/ HTN, poorly controlled. Continue on metoprolol , losartan  & amlodipine . IV hydralazine  prn   Acute diastolic CHF: w/ elevated BNP, CXR showing vascular congestion, pitting LE edema & dyspnea. Monitor I/Os. Fluid/volume management w/ HD. Echo shows EF 50-55%, grade II diastolic dysfunction, no regional wall motion abnormalities. Cardio following and recs apprec    COPD exacerbation: continue on steroids, bronchodilators & encourage incentive spirometry. Continue on tessalon pearles.   Chest pain: w/ hx of CAD. Troponins are minimally elevated. EKG shows no ischemic changes. Continue on metoprolol , losartan , statin, aspirin . Ok for pt to have narcotics as per pt's daughter  AKI on CKDIV: Cr is labile. S/p tunneled HD cath placed & will start HD 12/01/23. Nephro following and recs apprec   Metabolic acidosis: likely secondary to AKI on CKD. Will be managed w/ HD   Anxiety: severity unknown. Increase home dose of valium  as per pt's request   Hyperkalemia: will be managed w/ HD    Hyponatremia: w/ hx of SIADH. Labile. S/p tolvaptan x 1 as per nephro. Will be managed w/ HD. Nephro following & recs   ACD: likely secondary to CKD. No need for a transfusion currently   Acute diarrhea: x 3 days. Resolved as per pt    Esophageal dysphagia: s/p botox  via EGD in 08/2022. Aspiration precautions. Continue w/ supportive care   OSA: CPAP qhs  DM2: well controlled, HbA1c 6.1. Continue on glargine, SSI w/  accuchecks        DVT prophylaxis: lovenox  Code Status: full  Family Communication: discussed pt's care w/ pt's daughter at bedside and answered her questions  Disposition Plan: depends on PT/OT recs   Level of care: Progressive  Status is: Inpatient Remains inpatient appropriate because: severity of illness, will start HD today     Consultants:  Nephro   Procedures:   Antimicrobials:   Subjective: Pt c/o fatigue  Objective: Vitals:   12/01/23 1114 12/01/23 1130 12/01/23 1148 12/01/23 1237  BP: (!) 161/62 (!) 149/57 (!) 155/55 (!) 150/58  Pulse:  75 75 70  Resp: (!) 23 18 17    Temp: 97.7 F (36.5 C)  97.7 F (36.5 C) 97.8 F (36.6 C)  TempSrc: Oral  Oral   SpO2:  95% 96% 94%  Weight:       No intake or output data in the 24 hours ending 12/01/23 1528  Filed Weights   11/29/23 0500 11/30/23 0441 12/01/23 0500  Weight: 80.1 kg 79.1 kg 80.1 kg    Examination:  General exam: appears comfortable  Respiratory system: decreased breath sounds b/l  Cardiovascular system: S1 & S2+. No rubs or clicks   Gastrointestinal system: abd is soft, NT, ND & hypoactive bowel sounds  Central nervous system: alert & oriented. Moves all extremities  Psychiatry: judgement and insight appears at baseline.  Flat mood and affect    Data Reviewed: I have personally reviewed following labs and imaging studies  CBC: Recent Labs  Lab 11/28/23 0246 11/29/23 0600 11/30/23 0235 12/01/23 0416  WBC 11.1* 14.6* 14.8* 14.5*  NEUTROABS 9.7*  --   --   --   HGB 9.0* 7.9* 7.7* 7.4*  HCT 27.4* 23.6* 23.1* 22.6*  MCV 92.3 90.8 90.9 91.5  PLT 292 253 271 236   Basic Metabolic Panel: Recent Labs  Lab 11/28/23 0246 11/28/23 1452 11/28/23 1620 11/29/23 0600 11/29/23 1451 11/29/23 1831 11/30/23 0235 11/30/23 0236 12/01/23 0416  NA 123*   < > 122* 125*  --  125* 124* 125* 127*  K 5.2*   < > 5.0 5.6* 5.1  --  5.3*  --  5.3*  CL 94*  --  93* 96*  --   --  95*  --  97*  CO2 19*   --  18* 19*  --   --  20*  --  20*  GLUCOSE 207*  --  324* 282*  --   --  312*  --  237*  BUN 67*  --  66* 73*  --   --  82*  --  82*  CREATININE 3.59*  --  3.77* 3.95*  --   --  4.09*  --  4.02*  CALCIUM  8.8*  --  8.5* 8.5*  --   --  8.2*  --  8.4*  MG 2.2  --   --   --   --   --   --   --   --    < > = values in this interval not displayed.   GFR: Estimated Creatinine Clearance: 13 mL/min (A) (by C-G formula based on SCr of 4.02 mg/dL (H)). Liver Function Tests: Recent Labs  Lab 11/28/23 0246 11/28/23 1620  AST 17 22  ALT 11 11  ALKPHOS 95 88  BILITOT 0.7 0.7  PROT 6.7 6.2*  ALBUMIN 2.9* 2.6*   No results for input(s): LIPASE, AMYLASE in the last 168 hours. No results for input(s): AMMONIA in the last 168 hours. Coagulation Profile: No results for input(s): INR, PROTIME in the last 168 hours. Cardiac Enzymes: No results for input(s): CKTOTAL, CKMB, CKMBINDEX, TROPONINI in the last 168 hours. BNP (last 3 results) No results for input(s): PROBNP in the last 8760 hours. HbA1C: No results for input(s): HGBA1C in the last 72 hours. CBG: Recent Labs  Lab 11/30/23 1224 11/30/23 1607 11/30/23 2102 12/01/23 0824 12/01/23 1243  GLUCAP 202* 192* 399* 180* 156*   Lipid Profile: No results for input(s): CHOL, HDL, LDLCALC, TRIG, CHOLHDL, LDLDIRECT in the last 72 hours. Thyroid Function Tests: No results for input(s): TSH, T4TOTAL, FREET4, T3FREE, THYROIDAB in the last 72 hours. Anemia Panel: No results for input(s): VITAMINB12, FOLATE, FERRITIN, TIBC, IRON, RETICCTPCT in the last 72 hours. Sepsis Labs: No results for input(s): PROCALCITON, LATICACIDVEN in the last 168 hours.  No results found for this or any previous visit (from the past 240 hours).       Radiology Studies: PERIPHERAL VASCULAR CATHETERIZATION Result Date: 12/01/2023 See surgical note for result.       Scheduled Meds:  amLODipine   10 mg  Oral Daily   aspirin  EC  81 mg Oral QHS   benzonatate  200 mg Oral TID   Chlorhexidine  Gluconate Cloth  6 each Topical Q0600   diazepam   10 mg Oral Q1500   enoxaparin  (LOVENOX ) injection  30 mg Subcutaneous Q24H   guaiFENesin   600 mg Oral BID   insulin  aspart  0-5 Units Subcutaneous QHS   insulin  aspart  0-9 Units Subcutaneous TID WC   insulin  glargine-yfgn  10 Units Subcutaneous BID   losartan   100 mg Oral QHS   metoprolol  succinate  100 mg Oral QHS   nicotine   21 mg Transdermal Daily   predniSONE   40 mg Oral Q breakfast   rosuvastatin   5 mg Oral Daily   sodium bicarbonate   650 mg Oral BID   Continuous Infusions:  anticoagulant sodium citrate       LOS: 3 days       Alphonsus Jeans, MD [facility] Pager [phone]  If 7PM-7AM, please contact night-coverage [email or web address] 12/01/2023, 3:28 PM

## 2023-12-01 NOTE — Op Note (Signed)
 OPERATIVE NOTE    PRE-OPERATIVE DIAGNOSIS: 1. ESRD   POST-OPERATIVE DIAGNOSIS: same as above  PROCEDURE: Ultrasound guidance for vascular access to the right internal jugular vein Fluoroscopic guidance for placement of catheter Placement of a 19 cm tip to cuff tunneled hemodialysis catheter via the right internal jugular vein  SURGEON: Mikki Alexander, MD  ANESTHESIA:  Local with Moderate conscious sedation for approximately 23 minutes using 3 mg of Versed  and 75 mcg of Fentanyl   ESTIMATED BLOOD LOSS: 10 cc  FLUORO TIME: less than one minute  CONTRAST: none  FINDING(S): 1.  Patent right internal jugular vein  SPECIMEN(S):  None  INDICATIONS:   Mckenzie Lawrence is a 78 y.o.female who presents with renal failure.  The patient needs long term dialysis access for their ESRD, and a Permcath is necessary.  Risks and benefits are discussed and informed consent is obtained.    DESCRIPTION: After obtaining full informed written consent, the patient was brought back to the vascular suited. The patient's right neck and chest were sterilely prepped and draped in a sterile surgical field was created. Moderate conscious sedation was administered during a face to face encounter with the patient throughout the procedure with my supervision of the RN administering medicines and monitoring the patient's vital signs, pulse oximetry, telemetry and mental status throughout from the start of the procedure until the patient was taken to the recovery room.  The right internal jugular vein was visualized with ultrasound and found to be patent. It was then accessed under direct ultrasound guidance and a permanent image was recorded. A wire was placed. After skin nick and dilatation, the peel-away sheath was placed over the wire. I then turned my attention to an area under the clavicle. Approximately 1-2 fingerbreadths below the clavicle a small counterincision was created and tunneled from the subclavicular incision  to the access site. Using fluoroscopic guidance, a 19 centimeter tip to cuff tunneled hemodialysis catheter was selected, and tunneled from the subclavicular incision to the access site. It was then placed through the peel-away sheath and the peel-away sheath was removed. Using fluoroscopic guidance the catheter tips were parked in the right atrium. The appropriate distal connectors were placed. It withdrew blood well and flushed easily with heparinized saline and a concentrated heparin  solution was then placed. It was secured to the chest wall with 2 Prolene sutures. The access incision was closed single 4-0 Monocryl. A 4-0 Monocryl pursestring suture was placed around the exit site. Sterile dressings were placed. The patient tolerated the procedure well and was taken to the recovery room in stable condition.  COMPLICATIONS: None  CONDITION: Stable  Mikki Alexander, MD 12/01/2023 11:08 AM   This note was created with Dragon Medical transcription system. Any errors in dictation are purely unintentional.

## 2023-12-01 NOTE — Interval H&P Note (Signed)
 History and Physical Interval Note:  12/01/2023 8:58 AM  Mckenzie Lawrence  has presented today for surgery, with the diagnosis of ESRD.  The various methods of treatment have been discussed with the patient and family. After consideration of risks, benefits and other options for treatment, the patient has consented to  Procedure(s): DIALYSIS/PERMA CATHETER INSERTION (N/A) as a surgical intervention.  The patient's history has been reviewed, patient examined, no change in status, stable for surgery.  I have reviewed the patient's chart and labs.  Questions were answered to the patient's satisfaction.     Letrell Attwood

## 2023-12-01 NOTE — Progress Notes (Addendum)
 Pt is complaining of 8/10  pain describe as  aching painon the surgical site. Pt also added  I need a medicine for nausea:. NP Ouma made aware.  Update [phone]: See new orders.

## 2023-12-01 NOTE — Care Management Important Message (Signed)
 Important Message  Patient Details  Name: [name] MRN: [id number] Date of Birth: [birth year]   Important Message Given:  Yes - Medicare IM     Anise Kerns 12/01/2023, 4:24 PM

## 2023-12-01 NOTE — Progress Notes (Signed)
 [facility]  ROUNDING NOTE   Subjective:  [name] is a 78 y.o.  female with past medical history of hypertension, long-standing diabetes mellitus type 2, lower extremity edema, hyperlipidemia, tobacco abuse, prior history of CVA, COPD, obstructive sleep apnea, lumbar spinal fusion, left upper lobe lung mass who presents with volume overload and increasing shortness of breath.  Update: Patient received PermCath this afternoon.  Her daughter is at bedside.  Denies any acute complaints.   Objective:  Vital signs in last 24 hours:  Temp:  [97.6 F (36.4 C)-101.2 F (38.4 C)] 97.8 F (36.6 C) (06/16 1237) Pulse Rate:  [0-87] 70 (06/16 1237) Resp:  [9-23] 17 (06/16 1148) BP: (120-172)/(34-86) 150/58 (06/16 1237) SpO2:  [91 %-100 %] 94 % (06/16 1237) Weight:  [80.1 kg] 80.1 kg (06/16 0500)  Weight change: 1 kg Filed Weights   11/29/23 0500 11/30/23 0441 12/01/23 0500  Weight: 80.1 kg 79.1 kg 80.1 kg    Intake/Output: I/O last 3 completed shifts: In: 240 [P.O.:240] Out: 750 [Urine:750]   Intake/Output this shift:  No intake/output data recorded.  Physical Exam: General: Ill-appearing, Uremic  Head: Normocephalic, atraumatic. Dry mucosal membranes  Eyes: Anicteric,    Neck: Supple, t   Lungs:  Normal breathing effort  Heart: Regular rate and rhythm  Abdomen:  Soft, nontender,   Extremities: No peripheral edema.  Neurologic: Nonfocal, moving all four extremities  Skin: No lesions  Access: None    Basic Metabolic Panel: Recent Labs  Lab 11/28/23 0246 11/28/23 1452 11/28/23 1620 11/29/23 0600 11/29/23 1451 11/29/23 1831 11/30/23 0235 11/30/23 0236 12/01/23 0416  NA 123*   < > 122* 125*  --  125* 124* 125* 127*  K 5.2*   < > 5.0 5.6* 5.1  --  5.3*  --  5.3*  CL 94*  --  93* 96*  --   --  95*  --  97*  CO2 19*  --  18* 19*  --   --  20*  --  20*  GLUCOSE 207*  --  324* 282*  --   --  312*  --  237*  BUN 67*  --  66* 73*  --   --  82*  --   82*  CREATININE 3.59*  --  3.77* 3.95*  --   --  4.09*  --  4.02*  CALCIUM  8.8*  --  8.5* 8.5*  --   --  8.2*  --  8.4*  MG 2.2  --   --   --   --   --   --   --   --    < > = values in this interval not displayed.    Liver Function Tests: Recent Labs  Lab 11/28/23 0246 11/28/23 1620  AST 17 22  ALT 11 11  ALKPHOS 95 88  BILITOT 0.7 0.7  PROT 6.7 6.2*  ALBUMIN 2.9* 2.6*   No results for input(s): LIPASE, AMYLASE in the last 168 hours. No results for input(s): AMMONIA in the last 168 hours.  CBC: Recent Labs  Lab 11/28/23 0246 11/29/23 0600 11/30/23 0235 12/01/23 0416  WBC 11.1* 14.6* 14.8* 14.5*  NEUTROABS 9.7*  --   --   --   HGB 9.0* 7.9* 7.7* 7.4*  HCT 27.4* 23.6* 23.1* 22.6*  MCV 92.3 90.8 90.9 91.5  PLT 292 253 271 236    Cardiac Enzymes: No results for input(s): CKTOTAL, CKMB, CKMBINDEX, TROPONINI in the last 168 hours.  BNP: Invalid input(s): POCBNP  CBG: Recent Labs  Lab 11/30/23 1224 11/30/23 1607 11/30/23 2102 12/01/23 0824 12/01/23 1243  GLUCAP 202* 192* 399* 180* 156*    Microbiology: Results for orders placed or performed during the hospital encounter of 08/28/23  Blood culture (routine single)     Status: None   Collection Time: 08/28/23 12:40 PM   Specimen: BLOOD  Result Value Ref Range Status   Specimen Description BLOOD RIGHT ANTECUBITAL  Final   Special Requests   Final    BOTTLES DRAWN AEROBIC AND ANAEROBIC Blood Culture adequate volume   Culture   Final    NO GROWTH 5 DAYS Performed at [facility], [address], [place], [place] [address]    Report Status 09/02/2023 FINAL  Final  Blood culture (routine single)     Status: None   Collection Time: 08/28/23 12:40 PM   Specimen: BLOOD  Result Value Ref Range Status   Specimen Description BLOOD LEFT ANTECUBITAL  Final   Special Requests   Final    BOTTLES DRAWN AEROBIC AND ANAEROBIC Blood Culture adequate volume   Culture   Final    NO GROWTH 5  DAYS Performed at [facility], [address], [place], [place] [address]    Report Status 09/02/2023 FINAL  Final    Coagulation Studies: No results for input(s): LABPROT, INR in the last 72 hours.  Urinalysis: No results for input(s): COLORURINE, LABSPEC, PHURINE, GLUCOSEU, HGBUR, BILIRUBINUR, KETONESUR, PROTEINUR, UROBILINOGEN, NITRITE, LEUKOCYTESUR in the last 72 hours.  Invalid input(s): APPERANCEUR    Imaging: PERIPHERAL VASCULAR CATHETERIZATION Result Date: 12/01/2023 See surgical note for result.    Medications:    anticoagulant sodium citrate      amLODipine   10 mg Oral Daily   aspirin  EC  81 mg Oral QHS   benzonatate  200 mg Oral TID   Chlorhexidine  Gluconate Cloth  6 each Topical Q0600   diazepam   5 mg Oral Q1500   enoxaparin  (LOVENOX ) injection  30 mg Subcutaneous Q24H   guaiFENesin   600 mg Oral BID   insulin  aspart  0-5 Units Subcutaneous QHS   insulin  aspart  0-9 Units Subcutaneous TID WC   insulin  glargine-yfgn  10 Units Subcutaneous BID   losartan   100 mg Oral QHS   metoprolol  succinate  100 mg Oral QHS   nicotine   21 mg Transdermal Daily   predniSONE   40 mg Oral Q breakfast   rosuvastatin   5 mg Oral Daily   sodium bicarbonate   650 mg Oral BID   acetaminophen  **OR** acetaminophen , albuterol , anticoagulant sodium citrate, guaiFENesin -dextromethorphan, heparin , hydrALAZINE , ondansetron  **OR** ondansetron  (ZOFRAN ) IV, oxyCODONE -acetaminophen , oxyCODONE -acetaminophen , sucralfate , zolpidem   Assessment/ Plan:  [name] is a 78 y.o.  female  with past medical history of hypertension, long-standing diabetes mellitus type 2, lower extremity edema, hyperlipidemia, tobacco abuse, prior history of CVA, COPD, obstructive sleep apnea, lumbar spinal fusion, left upper lobe lung mass who presents with volume overload and increasing shortness of breath.   1.  Acute kidney injury/chronic kidney disease stage IV/diabetes  mellitus type 2 with chronic kidney disease/proteinuria.  eGFR down to 11.  Baseline creatinine 2.7 in February 2025.  Creatinine increased to 3.6-4 in June 2025. - PermCath has been placed today (12/01/2023) -First hemodialysis later today.  2 hours.  Next dialysis planned for tomorrow - Patient would like to go to [facility] [address] dialysis center as that is closest to her house.  Care management to assist with discharge planning.  Lab Results  Component Value Date   CREATININE 4.02 (H) 12/01/2023   CREATININE 4.09 (H) 11/30/2023   CREATININE 3.95 (H) 11/29/2023    No intake or output data in the 24 hours ending 12/01/23 1315     2.  Hyponatremia.  Differential includes underlying emphysema, lung nodules, AKI Expected to improved with dialysis.   3.  HTN - 2D echo from 11/29/2023 shows LVEF 55 to 60%, grade 2 diastolic dysfunction, trivial mitral regurgitation Currently blood pressure is managed with amlodipine , losartan , metoprolol    4.  Anemia of chronic kidney disease.  Hemoglobin 7.9 Patient has  left upper lobe mass concerning for malignancy, ESA may not be appropriate. Consider transfusion HgB<7     LOS: 3 Aundre Hietala 6/16/20251:15 PM

## 2023-12-01 NOTE — Plan of Care (Signed)

## 2023-12-01 NOTE — Inpatient Diabetes Management (Signed)
 Inpatient Diabetes Program Recommendations  AACE/ADA: New Consensus Statement on Inpatient Glycemic Control   Target Ranges:  Prepandial:   less than 140 mg/dL      Peak postprandial:   less than 180 mg/dL (1-2 hours)      Critically ill patients:  140 - 180 mg/dL    Latest Reference Range & Units 11/30/23 08:26 11/30/23 12:24 11/30/23 16:07 11/30/23 21:02 12/01/23 08:24  Glucose-Capillary 70 - 99 mg/dL 161 (H) 096 (H) 045 (H) 399 (H) 180 (H)   Review of Glycemic Control  Current orders for Inpatient glycemic control: Semglee  10 units BID, Novolog  0-9 units TID with meals, Novolog  0-5 units at bedtime; Prednisone  40 mg QAM  Inpatient Diabetes Program Recommendations:    Insulin : If steroids are continued, please consider increasing Semglee  to 12 units BID and ordering Novolog  3 units TID with meals for meal coverage if patient eats at least 50% of meals.  Thanks, Beacher Limerick, RN, MSN, CDCES Diabetes Coordinator Inpatient Diabetes Program [phone] (Team Pager from 8am to 5pm)

## 2023-12-02 DIAGNOSIS — Z9889 Other specified postprocedural states: Secondary | ICD-10-CM

## 2023-12-02 DIAGNOSIS — Z4901 Encounter for fitting and adjustment of extracorporeal dialysis catheter: Secondary | ICD-10-CM

## 2023-12-02 DIAGNOSIS — E871 Hypo-osmolality and hyponatremia: Secondary | ICD-10-CM | POA: Diagnosis not present

## 2023-12-02 DIAGNOSIS — I5031 Acute diastolic (congestive) heart failure: Secondary | ICD-10-CM | POA: Diagnosis not present

## 2023-12-02 LAB — BASIC METABOLIC PANEL WITH GFR
Anion gap: 11 (ref 5–15)
BUN: 87 mg/dL — ABNORMAL HIGH (ref 8–23)
CO2: 20 mmol/L — ABNORMAL LOW (ref 22–32)
Calcium: 8.3 mg/dL — ABNORMAL LOW (ref 8.9–10.3)
Chloride: 96 mmol/L — ABNORMAL LOW (ref 98–111)
Creatinine, Ser: 4.22 mg/dL — ABNORMAL HIGH (ref 0.44–1.00)
GFR, Estimated: 10 mL/min — ABNORMAL LOW (ref >60)
Glucose, Bld: 331 mg/dL — ABNORMAL HIGH (ref 70–99)
Potassium: 5.7 mmol/L — ABNORMAL HIGH (ref 3.5–5.1)
Sodium: 127 mmol/L — ABNORMAL LOW (ref 135–145)

## 2023-12-02 LAB — GLUCOSE, CAPILLARY
Glucose-Capillary: 249 mg/dL — ABNORMAL HIGH (ref 70–99)
Glucose-Capillary: 299 mg/dL — ABNORMAL HIGH (ref 70–99)
Glucose-Capillary: 364 mg/dL — ABNORMAL HIGH (ref 70–99)
Glucose-Capillary: 420 mg/dL — ABNORMAL HIGH (ref 70–99)
Glucose-Capillary: 431 mg/dL — ABNORMAL HIGH (ref 70–99)

## 2023-12-02 LAB — CBC
HCT: 24.2 % — ABNORMAL LOW (ref 36.0–46.0)
Hemoglobin: 8.2 g/dL — ABNORMAL LOW (ref 12.0–15.0)
MCH: 30.8 pg (ref 26.0–34.0)
MCHC: 33.9 g/dL (ref 30.0–36.0)
MCV: 91 fL (ref 80.0–100.0)
Platelets: 229 10*3/uL (ref 150–400)
RBC: 2.66 MIL/uL — ABNORMAL LOW (ref 3.87–5.11)
RDW: 14.5 % (ref 11.5–15.5)
WBC: 10.3 10*3/uL (ref 4.0–10.5)
nRBC: 0 % (ref 0.0–0.2)

## 2023-12-02 LAB — HEPATITIS B SURFACE ANTIBODY, QUANTITATIVE: Hep B S AB Quant (Post): <3.5 m[IU]/mL — ABNORMAL LOW

## 2023-12-02 MED ORDER — INSULIN ASPART 100 UNIT/ML IJ SOLN
10.0000 [IU] | Freq: Once | INTRAMUSCULAR | Status: AC
  Administered 2023-12-02: 10 [IU] via SUBCUTANEOUS
  Filled 2023-12-02: qty 1

## 2023-12-02 MED ORDER — CLONIDINE HCL 0.1 MG PO TABS
0.1000 mg | ORAL_TABLET | Freq: Two times a day (BID) | ORAL | Status: DC
  Administered 2023-12-02 – 2023-12-06 (×7): 0.1 mg via ORAL
  Filled 2023-12-02 (×8): qty 1

## 2023-12-02 MED ORDER — HEPARIN SODIUM (PORCINE) 5000 UNIT/ML IJ SOLN
5000.0000 [IU] | Freq: Three times a day (TID) | INTRAMUSCULAR | Status: DC
  Administered 2023-12-03 – 2023-12-08 (×13): 5000 [IU] via SUBCUTANEOUS
  Filled 2023-12-02 (×15): qty 1

## 2023-12-02 MED ORDER — ZOLPIDEM TARTRATE 5 MG PO TABS
5.0000 mg | ORAL_TABLET | Freq: Every evening | ORAL | Status: DC | PRN
  Administered 2023-12-02 – 2023-12-03 (×2): 5 mg via ORAL
  Filled 2023-12-02 (×2): qty 1

## 2023-12-02 MED ORDER — INSULIN ASPART 100 UNIT/ML IJ SOLN
3.0000 [IU] | Freq: Three times a day (TID) | INTRAMUSCULAR | Status: DC
  Administered 2023-12-02 – 2023-12-03 (×3): 3 [IU] via SUBCUTANEOUS
  Filled 2023-12-02 (×3): qty 1

## 2023-12-02 MED ORDER — EPOETIN ALFA-EPBX 4000 UNIT/ML IJ SOLN
4000.0000 [IU] | INTRAMUSCULAR | Status: DC
  Administered 2023-12-03: 4000 [IU] via INTRAVENOUS
  Filled 2023-12-02 (×3): qty 1

## 2023-12-02 MED ORDER — ZOLPIDEM TARTRATE 5 MG PO TABS: 10.0000 mg | ORAL_TABLET | Freq: Every evening | ORAL | Status: DC | PRN

## 2023-12-02 MED ORDER — INSULIN GLARGINE-YFGN 100 UNIT/ML ~~LOC~~ SOLN
12.0000 [IU] | Freq: Two times a day (BID) | SUBCUTANEOUS | Status: DC
  Administered 2023-12-02 – 2023-12-03 (×4): 12 [IU] via SUBCUTANEOUS
  Filled 2023-12-02 (×6): qty 0.12

## 2023-12-02 NOTE — Progress Notes (Signed)
  Progress Note    12/02/2023 10:18 AM 1 Day Post-Op  Subjective:  Mckenzie Lawrence is a 78 yo female now POD #1 from Dialysis perma catheter placement.  Patient is resting comfortably in hemodialysis this morning.  Patient endorses some soreness around the site in her right chest where the catheter was placed but otherwise tolerable.  Patient is recovering as expected.  No complaints overnight.  Vitals are remained stable.   Vitals:   12/02/23 0930 12/02/23 1000  BP: (!) 168/56 (!) 172/57  Pulse: 64 68  Resp: 14 13  Temp:    SpO2: 98% 96%   Physical Exam: Cardiac:  RRR, normal S1 and S2, no rubs clicks gallops or murmurs noted. Lungs: Lungs are clear throughout on auscultation this morning.  No rales rhonchi or wheezing. Incisions: Right chest incision with tunnel catheter placement.  No signs or symptoms of hematoma seroma or infection. Extremities: All extremities are warm to touch with palpable pulses. Abdomen: Positive bowel sounds throughout on auscultation.  Soft, nontender nondistended. Neurologic: Alert and oriented x 3, answers all questions follows commands appropriately.  CBC    Component Value Date/Time   WBC 10.3 12/02/2023 0430   RBC 2.66 (L) 12/02/2023 0430   HGB 8.2 (L) 12/02/2023 0430   HGB 13.6 12/01/2011 1656   HCT 24.2 (L) 12/02/2023 0430   HCT 40.9 12/01/2011 1656   PLT 229 12/02/2023 0430   PLT 299 12/01/2011 1656   MCV 91.0 12/02/2023 0430   MCV 95 12/01/2011 1656   MCH 30.8 12/02/2023 0430   MCHC 33.9 12/02/2023 0430   RDW 14.5 12/02/2023 0430   RDW 13.1 12/01/2011 1656   LYMPHSABS 0.6 (L) 11/28/2023 0246   MONOABS 0.7 11/28/2023 0246   EOSABS 0.0 11/28/2023 0246   BASOSABS 0.0 11/28/2023 0246    BMET    Component Value Date/Time   NA 127 (L) 12/02/2023 0430   NA 137 12/01/2011 1656   K 5.7 (H) 12/02/2023 0430   K 4.3 12/01/2011 1656   CL 96 (L) 12/02/2023 0430   CL 104 12/01/2011 1656   CO2 20 (L) 12/02/2023 0430   CO2 23 12/01/2011  1656   GLUCOSE 331 (H) 12/02/2023 0430   GLUCOSE 116 (H) 12/01/2011 1656   BUN 87 (H) 12/02/2023 0430   BUN 12 12/01/2011 1656   CREATININE 4.22 (H) 12/02/2023 0430   CREATININE 1.15 12/01/2011 1656   CALCIUM  8.3 (L) 12/02/2023 0430   CALCIUM  8.5 12/01/2011 1656   GFRNONAA 10 (L) 12/02/2023 0430   GFRNONAA 50 (L) 12/01/2011 1656   GFRAA 43 (L) 05/06/2018 1436   GFRAA 58 (L) 12/01/2011 1656    INR    Component Value Date/Time   INR 1.4 (H) 07/19/2023 2040     Intake/Output Summary (Last 24 hours) at 12/02/2023 1018 Last data filed at 12/02/2023 0445 Gross per 24 hour  Intake 250 ml  Output 550 ml  Net -300 ml     Assessment/Plan:  78 y.o. female is s/p placement dialysis permacatheter to right chest 1 Day Post-Op   Plan Okay for vascular surgery for hemodialysis to use dialysis permacatheter.   DVT prophylaxis: Heparin  with dialysis   Annamaria Barrette [facility] 12/02/2023 10:18 AM

## 2023-12-02 NOTE — Progress Notes (Addendum)
 Pt blood sugar at 420. NP Ouma made aware.  Update 2212: See new order.  Update 0009: Blood sugar at 364 after intervention. MD Mansy made aware.

## 2023-12-02 NOTE — Progress Notes (Signed)
 [name] CLINIC CARDIOLOGY PROGRESS NOTE       Patient ID: Mckenzie Lawrence MRN: [id number] DOB/AGE: 76-May-1947 76 y.o.  Admit date: 11/28/2023 Referring Physician Dr. Brion Cancel Primary Physician Lyle San, MD Primary Cardiologist Dr. Parks Bollman Reason for Consultation diastolic heart failure with SOB.  HPI: Mckenzie Lawrence is a 78 y.o. female  with a past medical history of coronary artery disease, hypertension, hyperlipidemia, COPD, OSA, hx CVA, CKD stage 4, SIADH, diabetes mellitus who presented to the ED on 11/28/2023 for worsening and persistent SOB and lower extremity swelling with concurrent COPD exacerbation and found elevated BNP of 2500. Cardiology was consulted for further evaluation.   Interval History: -Patient seen and examined this afternoon and laying comfortably in hospital bed. Patient states she feels tired after dialysis. Reports she feels mild SOB. Continues to deny chest pain. -Patients BP elevated and HR stable. Overnight Tele showed no significant events.  -Patient remains on room air with stable SpO2.  -Permacath placement by Dr. Vonna Guardian on 06/16. -Underwent 1st hemodialysis treatment today and tolerated well. Next dialysis planned for tomorrow.    Review of systems complete and found to be negative unless listed above    Past Medical History:  Diagnosis Date   Anemia    Arthritis    Bladder incontinence    Broken foot    Cataracts, bilateral    Chronic kidney insufficiency    stage 3b   COPD (chronic obstructive pulmonary disease) (HCC)    wheezing   Coronary artery disease 04/25/2022   in [place]   COVID 2021   very mild case   CVA (cerebral vascular accident) (HCC) 2016   has had 3 strokes, states right side is slightlyweaker than left   Diabetes mellitus    insulin  dependent, Type 2   GERD (gastroesophageal reflux disease)    HLD (hyperlipidemia)    Hx of cardiovascular stress test    a. ETT (6/13):  Ex 5:13; no ischemic changes   Hypertension     controlled on meds   Lacunar stroke of left subthalamic region [facility]) 02/2015   Leg pain    left   Lower back pain    Neuromuscular disorder (HCC)    stroke right hand tingling   Orthostatic hypotension    Osteopenia 01/2017   T score -2.0 stable from prior DEXA   Pancreatitis 10/2021   PCOS (polycystic ovarian syndrome)    Personal history of tobacco use, presenting hazards to health 01/09/2015   PONV (postoperative nausea and vomiting)    Sleep apnea    uses CPAP nightly    Past Surgical History:  Procedure Laterality Date   BIOPSY  08/28/2022   Procedure: BIOPSY;  Surgeon: Felecia Hopper, MD;  Location: [facility];  Service: Gastroenterology;;   BOTOX  INJECTION  01/15/2022   Procedure: BOTOX  INJECTION;  Surgeon: Ozell Blunt, MD;  Location: [facility];  Service: Gastroenterology;;   BOTOX  INJECTION N/A 08/28/2022   Procedure: BOTOX  INJECTION;  Surgeon: Felecia Hopper, MD;  Location: [facility];  Service: Gastroenterology;  Laterality: N/A;   BOTOX  INJECTION Bilateral 05/27/2023   Procedure: BOTOX  INJECTION;  Surgeon: Ozell Blunt, MD;  Location: [facility];  Service: Gastroenterology;  Laterality: Bilateral;   BROW LIFT Bilateral 11/04/2017   Procedure: BLEPHAROPLASTY UPPER EYELID W/EXCESS SKIN;  Surgeon: Zacarias Hermann, MD;  Location: [facility];  Service: Ophthalmology;  Laterality: Bilateral;  DIABETES-insulin  dependent uses CPAP   CARDIAC CATHETERIZATION  20 yrs ago   found nothing   CARPAL  TUNNEL RELEASE Bilateral    CATARACT EXTRACTION Bilateral    DIALYSIS/PERMA CATHETER INSERTION N/A 12/01/2023   Procedure: DIALYSIS/PERMA CATHETER INSERTION;  Surgeon: Celso College, MD;  Location: [facility] INVASIVE CV LAB;  Service: Cardiovascular;  Laterality: N/A;   ELBOW SURGERY Bilateral    ESOPHAGOGASTRODUODENOSCOPY N/A 07/25/2021   Procedure: ESOPHAGOGASTRODUODENOSCOPY (EGD);  Surgeon: Toledo, Alphonsus Jeans, MD;  Location: [facility] ENDOSCOPY;  Service: Gastroenterology;   Laterality: N/A;  IDDM   ESOPHAGOGASTRODUODENOSCOPY N/A 01/15/2022   Procedure: ESOPHAGOGASTRODUODENOSCOPY (EGD);  Surgeon: Ozell Blunt, MD;  Location: [facility];  Service: Gastroenterology;  Laterality: N/A;  botox    ESOPHAGOGASTRODUODENOSCOPY (EGD) WITH PROPOFOL  N/A 08/28/2022   Procedure: ESOPHAGOGASTRODUODENOSCOPY (EGD) WITH PROPOFOL ;  Surgeon: Felecia Hopper, MD;  Location: [facility];  Service: Gastroenterology;  Laterality: N/A;   ESOPHAGOGASTRODUODENOSCOPY (EGD) WITH PROPOFOL  Bilateral 05/27/2023   Procedure: ESOPHAGOGASTRODUODENOSCOPY (EGD) WITH PROPOFOL ;  Surgeon: Ozell Blunt, MD;  Location: [facility];  Service: Gastroenterology;  Laterality: Bilateral;   FOOT SURGERY     Groin Abscess     HAND SURGERY     KNEE SURGERY Bilateral    LABIAL ABSCESS     LUMBAR LAMINECTOMY/DECOMPRESSION MICRODISCECTOMY Left 05/11/2018   Procedure: LUMBAR LAMINECTOMY/DECOMPRESSION MICRODISCECTOMY 1 LEVEL- L4-5;  Surgeon: Berta Brittle, MD;  Location: [facility] ORS;  Service: Neurosurgery;  Laterality: Left;   LUMBAR LAMINECTOMY/DECOMPRESSION MICRODISCECTOMY Left 05/20/2022   Procedure: MICRODISCECTOMY L3-4;  Surgeon: Garry Kansas, MD;  Location: [facility] OR;  Service: Neurosurgery;  Laterality: Left;  3C   LUMBAR WOUND DEBRIDEMENT N/A 08/08/2022   Procedure: INCISION AND DRAINAGE OF LUMBAR WOUND;  Surgeon: Garry Kansas, MD;  [facility];  Service: Neurosurgery;  Laterality: N/A;  3C   OOPHORECTOMY     BSO   PUBO VAG SLING     SHOULDER SURGERY     bilateral arthroscopies   TEE WITHOUT CARDIOVERSION N/A 07/25/2023   Procedure: TRANSESOPHAGEAL ECHOCARDIOGRAM (TEE);  Surgeon: Dorita Garter, MD;  Location: [facility] ORS;  Service: Cardiovascular;  Laterality: N/A;   VAGINAL HYSTERECTOMY  1979    Medications Prior to Admission  Medication Sig Dispense Refill Last Dose/Taking   acetaminophen  (TYLENOL ) 500 MG tablet Take 1,000 mg by mouth every 6 (six) hours as needed for moderate pain.   Unknown    albuterol  (VENTOLIN  HFA) 108 (90 Base) MCG/ACT inhaler Inhale 2 puffs into the lungs every 6 (six) hours as needed for wheezing or shortness of breath.   Unknown   aspirin  EC 81 MG tablet Take 81 mg by mouth at bedtime. Swallow whole.   Taking   calcitRIOL  (ROCALTROL ) 0.25 MCG capsule Take 0.25 mcg by mouth at bedtime.   Taking   cholecalciferol (VITAMIN D3) 25 MCG (1000 UNIT) tablet Take 1,000 Units by mouth daily.   Taking   cyclobenzaprine  (FLEXERIL ) 10 MG tablet Take 1 tablet (10 mg total) by mouth 3 (three) times daily as needed for muscle spasms. 30 tablet 0 Taking As Needed   D-MANNOSE PO Take 2 tablets by mouth at bedtime.   Taking   diazepam  (VALIUM ) 5 MG tablet Take 5 mg by mouth 2 (two) times daily as needed.   Taking As Needed   diphenhydrAMINE (BENADRYL) 25 MG tablet Take 50 mg by mouth at bedtime as needed for sleep.   Taking As Needed   docusate sodium  (COLACE) 100 MG capsule Take 1 capsule (100 mg total) by mouth 2 (two) times daily. (Patient taking differently: Take 100 mg by mouth at bedtime as needed for mild constipation or moderate constipation.) 30 capsule  0 Taking Differently   estradiol  (ESTRACE ) 0.1 MG/GM vaginal cream Place 1 Applicatorful vaginally 3 (three) times a week.   Taking   Fe Fum-Vit C-Vit B12-FA (TRIGELS-F FORTE) CAPS capsule Take 1 capsule by mouth daily after breakfast. 90 capsule 0 Taking   insulin  glargine-yfgn (SEMGLEE ) 100 UNIT/ML Pen Inject 10 Units into the skin 2 (two) times daily.   Taking   losartan  (COZAAR ) 100 MG tablet Take 1 tablet (100 mg total) by mouth daily. (Patient taking differently: Take 100 mg by mouth at bedtime.) 30 tablet 2 Taking Differently   metoprolol  succinate (TOPROL -XL) 100 MG 24 hr tablet Take 100 mg by mouth at bedtime.   Taking   Multiple Vitamins-Minerals (PRESERVISION AREDS 2+MULTI VIT PO) Take 1 capsule by mouth daily.   Taking   nitrofurantoin  (MACRODANTIN ) 100 MG capsule Take 100 mg by mouth at bedtime.   Taking    rosuvastatin  (CRESTOR ) 5 MG tablet Take 5 mg by mouth daily.   Taking   sennosides-docusate sodium  (SENOKOT-S) 8.6-50 MG tablet Take 1 tablet by mouth daily as needed for constipation.   Taking As Needed   sodium bicarbonate  650 MG tablet Take 1 tablet (650 mg total) by mouth 2 (two) times daily. (Patient taking differently: Take 650 mg by mouth daily.) 100 tablet 1 Taking Differently   sucralfate  (CARAFATE ) 1 g tablet Take 1 tablet (1 g total) by mouth 2 (two) times daily as needed (acid reflux).   Taking As Needed   Vibegron (GEMTESA) 75 MG TABS Take 75 mg by mouth at bedtime.   Taking   zolpidem  (AMBIEN ) 10 MG tablet Take 10 mg by mouth at bedtime as needed for sleep.   Taking As Needed   amLODipine  (NORVASC ) 10 MG tablet Take 1 tablet (10 mg total) by mouth daily. (Patient not taking: Reported on 11/28/2023) 30 tablet 2 Not Taking   BD INSULIN  SYRINGE U/F 31G X 5/16 1 ML MISC USE 1 SYRINGE AS DIRECTED      Continuous Glucose Sensor (FREESTYLE LIBRE 2 SENSOR) MISC       epoetin  alfa-epbx (RETACRIT ) 4000 UNIT/ML injection Inject 1 mL (4,000 Units total) into the skin once a week. (Patient not taking: Reported on 11/28/2023) 4 mL 3 Not Taking   feeding supplement (BOOST HIGH PROTEIN) LIQD Take 1 Container by mouth 2 (two) times daily between meals.      insulin  detemir (LEVEMIR ) 100 UNIT/ML injection Inject 100-120 Units into the skin daily. (Patient not taking: Reported on 11/28/2023)   Not Taking   nystatin  (MYCOSTATIN ) 100000 UNIT/ML suspension Take 5 mLs (500,000 Units total) by mouth 4 (four) times daily. (Patient not taking: Reported on 11/28/2023) 120 mL 1 Not Taking   ONETOUCH ULTRA test strip 4 (four) times daily.      Social History   Socioeconomic History   Marital status: Married    Spouse name: Not on file   Number of children: 1   Years of education: Not on file   Highest education level: Not on file  Occupational History   Not on file  Tobacco Use   Smoking status: Every Day     Current packs/day: 1.50    Average packs/day: 1.5 packs/day for 50.0 years (75.0 ttl pk-yrs)    Types: Cigarettes   Smokeless tobacco: Never   Tobacco comments:    3ppd x 10 year, then cut back to 1.5pdd since 02/2015  Vaping Use   Vaping status: Never Used  Substance and Sexual Activity   Alcohol  use: Never    Alcohol/week: 0.0 standard drinks of alcohol   Drug use: No   Sexual activity: Not Currently    Birth control/protection: Surgical    Comment: Hx Hysterectomy, 1st intercourse 78 yo-Fewer than 5 partners  Other Topics Concern   Not on file  Social History Narrative   Lives at home with husband in a one story home.  Has 1 daughter.     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     Education: some college.   Social Drivers of Corporate investment banker Strain: Low Risk  (10/31/2023)   Received from [facility]   Overall Financial Resource Strain (CARDIA)    Difficulty of Paying Living Expenses: Not hard at all  Food Insecurity: No Food Insecurity (11/28/2023)   Hunger Vital Sign    Worried About Running Out of Food in the Last Year: Never true    Ran Out of Food in the Last Year: Never true  Transportation Needs: No Transportation Needs (11/28/2023)   PRAPARE - Administrator, Civil Service (Medical): No    Lack of Transportation (Non-Medical): No  Physical Activity: Not on file  Stress: Not on file  Social Connections: Unknown (11/28/2023)   Social Connection and Isolation Panel    Frequency of Communication with Friends and Family: More than three times a week    Frequency of Social Gatherings with Friends and Family: More than three times a week    Attends Religious Services: More than 4 times per year    Active Member of [employer] or Organizations: Not on file    Attends Banker Meetings: Not on file    Marital Status: Not on file  Intimate Partner Violence: Not At Risk (11/28/2023)   Humiliation, Afraid, Rape, and  Kick questionnaire    Fear of Current or Ex-Partner: No    Emotionally Abused: No    Physically Abused: No    Sexually Abused: No    Family History  Problem Relation Age of Onset   Hypertension Mother    Heart disease Mother 86       MI   Diabetes Father    Diabetes Sister    Hypertension Sister    Diabetes Brother    Hypertension Brother    Heart disease Brother 9       CAD   Cancer Sister        Multiple myloma   Diabetes Brother      Vitals:   12/02/23 1028 12/02/23 1135 12/02/23 1136 12/02/23 1220  BP:   (!) 182/67   Pulse: 70  76   Resp: (!) 21 18  18   Temp:   98.2 F (36.8 C)   TempSrc:      SpO2: 97%  99%   Weight: 73.4 kg     Height:        PHYSICAL EXAM General: well appearing elderly female, well nourished, in no acute distress. HEENT: Normocephalic and atraumatic. Neck: No JVD.   Lungs: Normal respiratory effort on room air. Diminished breath sounds bilaterally, bibasilar crackles.  Heart: HRRR. Normal S1 and S2 without gallops or murmurs.  Abdomen: Non-distended appearing.  Msk: Normal strength and tone for age. Extremities: Warm and well perfused. No clubbing, cyanosis. 1+ pitting edema bilaterally.  Neuro: Alert and oriented X 3. Psych: Answers questions appropriately.   Labs: Basic Metabolic Panel: Recent Labs    12/01/23 0416 12/02/23 0430  NA 127* 127*  K 5.3* 5.7*  CL 97* 96*  CO2 20* 20*  GLUCOSE 237* 331*  BUN 82* 87*  CREATININE 4.02* 4.22*  CALCIUM  8.4* 8.3*   Liver Function Tests: No results for input(s): AST, ALT, ALKPHOS, BILITOT, PROT, ALBUMIN in the last 72 hours.  No results for input(s): LIPASE, AMYLASE in the last 72 hours. CBC: Recent Labs    12/01/23 0416 12/02/23 0430  WBC 14.5* 10.3  HGB 7.4* 8.2*  HCT 22.6* 24.2*  MCV 91.5 91.0  PLT 236 229   Cardiac Enzymes: No results for input(s): CKTOTAL, CKMB, CKMBINDEX, TROPONINIHS in the last 72 hours. BNP: No results for input(s): BNP  in the last 72 hours. D-Dimer: No results for input(s): DDIMER in the last 72 hours. Hemoglobin A1C: No results for input(s): HGBA1C in the last 72 hours. Fasting Lipid Panel: No results for input(s): CHOL, HDL, LDLCALC, TRIG, CHOLHDL, LDLDIRECT in the last 72 hours. Thyroid Function Tests: No results for input(s): TSH, T4TOTAL, T3FREE, THYROIDAB in the last 72 hours.  Invalid input(s): FREET3 Anemia Panel: No results for input(s): VITAMINB12, FOLATE, FERRITIN, TIBC, IRON, RETICCTPCT in the last 72 hours.   Radiology: PERIPHERAL VASCULAR CATHETERIZATION Result Date: 12/01/2023 See surgical note for result.  ECHOCARDIOGRAM COMPLETE Result Date: 11/29/2023    ECHOCARDIOGRAM REPORT   Patient Name:   Mckenzie Lawrence Date of Exam: 11/29/2023 Medical Rec #:  [id number]      Height:       67.0 in Accession #:    [id number]     Weight:       176.6 lb Date of Birth:  1946/04/09      BSA:          1.918 m Patient Age:    78 years       BP:           163/51 mmHg Patient Gender: F              HR:           81 bpm. Exam Location:  [facility] Procedure: 2D Echo, Cardiac Doppler and Color Doppler (Both Spectral and Color            Flow Doppler were utilized during procedure). Indications:     CHF I50.31  History:         Patient has prior history of Echocardiogram examinations, most                  recent 07/23/2023.  Sonographer:     Clenton Czech RDCS, FASE Referring Phys:  [id number] Alphonsus Jeans Diagnosing Phys: Dwayne D Callwood MD IMPRESSIONS  1. Left ventricular ejection fraction, by estimation, is 55 to 60%. The left ventricle has normal function. The left ventricle has no regional wall motion abnormalities. The left ventricular internal cavity size was moderately dilated. Left ventricular diastolic parameters are consistent with Grade II diastolic dysfunction (pseudonormalization).  2. Right ventricular systolic function is normal. The right ventricular size is normal.   3. Left atrial size was mildly dilated.  4. The mitral valve is normal in structure. Trivial mitral valve regurgitation.  5. The aortic valve is normal in structure. Aortic valve regurgitation is not visualized. FINDINGS  Left Ventricle: Left ventricular ejection fraction, by estimation, is 55 to 60%. The left ventricle has normal function. The left ventricle has no regional wall motion abnormalities. Strain was performed and the global longitudinal strain is indeterminate. The left ventricular internal cavity size was moderately dilated. There is no  left ventricular hypertrophy. Left ventricular diastolic parameters are consistent with Grade II diastolic dysfunction (pseudonormalization). Right Ventricle: The right ventricular size is normal. No increase in right ventricular wall thickness. Right ventricular systolic function is normal. Left Atrium: Left atrial size was mildly dilated. Right Atrium: Right atrial size was normal in size. Pericardium: There is no evidence of pericardial effusion. Mitral Valve: The mitral valve is normal in structure. Trivial mitral valve regurgitation. Tricuspid Valve: The tricuspid valve is normal in structure. Tricuspid valve regurgitation is mild. Aortic Valve: The aortic valve is normal in structure. Aortic valve regurgitation is not visualized. Aortic valve peak gradient measures 8.8 mmHg. Pulmonic Valve: The pulmonic valve was normal in structure. Pulmonic valve regurgitation is not visualized. Aorta: The ascending aorta was not well visualized. IAS/Shunts: No atrial level shunt detected by color flow Doppler. Additional Comments: 3D was performed not requiring image post processing on an independent workstation and was indeterminate.  LEFT VENTRICLE PLAX 2D LVIDd:         5.50 cm     Diastology LVIDs:         3.80 cm     LV e' medial:    7.18 cm/s LV PW:         1.00 cm     LV E/e' medial:  19.9 LV IVS:        1.10 cm     LV e' lateral:   8.38 cm/s LVOT diam:     1.80 cm      LV E/e' lateral: 17.1 LV SV:         63 LV SV Index:   33 LVOT Area:     2.54 cm  LV Volumes (MOD) LV vol d, MOD A2C: 74.1 ml LV vol d, MOD A4C: 70.9 ml LV vol s, MOD A2C: 37.0 ml LV vol s, MOD A4C: 28.8 ml LV SV MOD A2C:     37.1 ml LV SV MOD A4C:     70.9 ml LV SV MOD BP:      41.0 ml RIGHT VENTRICLE RV Basal diam:  3.20 cm RV S prime:     14.80 cm/s TAPSE (M-mode): 2.6 cm LEFT ATRIUM             Index        RIGHT ATRIUM           Index LA diam:        4.30 cm 2.24 cm/m   RA Area:     19.70 cm LA Vol (A2C):   44.9 ml 23.41 ml/m  RA Volume:   55.40 ml  28.88 ml/m LA Vol (A4C):   53.8 ml 28.05 ml/m LA Biplane Vol: 51.9 ml 27.06 ml/m  AORTIC VALVE                 PULMONIC VALVE AV Area (Vmax): 1.84 cm     PV Vmax:        1.01 m/s AV Vmax:        148.00 cm/s  PV Peak grad:   4.1 mmHg AV Peak Grad:   8.8 mmHg     RVOT Peak grad: 3 mmHg LVOT Vmax:      107.00 cm/s LVOT Vmean:     76.000 cm/s LVOT VTI:       0.249 m  AORTA Ao Root diam: 2.90 cm MITRAL VALVE MV Area (PHT): 4.86 cm     SHUNTS MV Decel Time: 156 msec     Systemic VTI:  0.25 m MV E velocity: 143.00 cm/s  Systemic Diam: 1.80 cm MV A velocity: 80.30 cm/s MV E/A ratio:  1.78 Antonette Batters MD Electronically signed by Antonette Batters MD Signature Date/Time: 11/29/2023/10:33:04 AM    Final    US  Venous Img Lower Bilateral Result Date: 11/28/2023 CLINICAL DATA:  New onset left greater than right lower extremity swelling, without findings of CHF. EXAM: BILATERAL LOWER EXTREMITY VENOUS DOPPLER ULTRASOUND TECHNIQUE: Gray-scale sonography with graded compression, as well as color Doppler and duplex ultrasound were performed to evaluate the lower extremity deep venous systems from the level of the common femoral vein and including the common femoral, femoral, profunda femoral, popliteal and calf veins including the posterior tibial, peroneal and gastrocnemius veins when visible. The superficial great saphenous vein was also interrogated. Spectral  Doppler was utilized to evaluate flow at rest and with distal augmentation maneuvers in the common femoral, femoral and popliteal veins. COMPARISON:  Left lower extremity DVT exam 08/26/2019. FINDINGS: RIGHT LOWER EXTREMITY Common Femoral Vein: No evidence of thrombus. Normal compressibility, respiratory phasicity and response to augmentation. Saphenofemoral Junction: No evidence of thrombus. Normal compressibility and flow on color Doppler imaging. Profunda Femoral Vein: No evidence of thrombus. Normal compressibility and flow on color Doppler imaging. Femoral Vein: No evidence of thrombus. Normal compressibility, respiratory phasicity and response to augmentation. Popliteal Vein: No evidence of thrombus. Normal compressibility, respiratory phasicity and response to augmentation. Calf Veins: No evidence of thrombus. Normal compressibility and flow on color Doppler imaging. Superficial Great Saphenous Vein: No evidence of thrombus. Normal compressibility. Venous Reflux:  None. Other Findings:  Pulsatile venous waveforms. LEFT LOWER EXTREMITY Common Femoral Vein: No evidence of thrombus. Normal compressibility, respiratory phasicity and response to augmentation. Saphenofemoral Junction: No evidence of thrombus. Normal compressibility and flow on color Doppler imaging. Profunda Femoral Vein: No evidence of thrombus. Normal compressibility and flow on color Doppler imaging. Femoral Vein: No evidence of thrombus. Normal compressibility, respiratory phasicity and response to augmentation. Popliteal Vein: No evidence of thrombus. Normal compressibility, respiratory phasicity and response to augmentation. Calf Veins: No evidence of thrombus. Normal compressibility and flow on color Doppler imaging. Superficial Great Saphenous Vein: No evidence of thrombus. Normal compressibility. Venous Reflux:  None. Other Findings:  Pulsatile venous waveforms. IMPRESSION: 1. No evidence of deep venous thrombosis in either lower  extremity. 2. Pulsatile venous waveforms, which can be seen with right heart failure or tricuspid regurgitation. Electronically Signed   By: Denman Fischer M.D.   On: 11/28/2023 04:56   DG Chest Portable 1 View Result Date: 11/28/2023 CLINICAL DATA:  Difficulty breathing EXAM: PORTABLE CHEST 1 VIEW COMPARISON:  07/19/2023 FINDINGS: Cardiac shadow is mildly enlarged. Aortic calcifications are seen. Mild vascular congestion is noted. Small right-sided pleural effusion is noted. Metallic densities are noted symmetrically over the upper chest likely artifactual in nature. IMPRESSION: Small right pleural effusion and vascular congestion. Electronically Signed   By: Violeta Grey M.D.   On: 11/28/2023 02:59    ECHO as above  TELEMETRY reviewed by me 12/02/2023: sinus rhythm with PACs, rate 70s  EKG reviewed by me: Normal sinus rhythm, mild right axis nonspecific ST-T wave changes   Data reviewed by me 12/02/2023: last 24h vitals tele labs imaging I/O hospitalist progress note, nephrology notes.  Principal Problem:   Hypertensive urgency/emergency Active Problems:   Hyponatremia   CAD (coronary artery disease)   COPD with acute exacerbation (HCC)   Acute renal failure superimposed on stage 4 chronic kidney disease (HCC)  Chest pain   Hypertensive emergency   Acute dyspnea   Encounter for dialysis catheter care [facility])    ASSESSMENT AND PLAN:  Mckenzie Lawrence is a 78 y.o. female  with a past medical history of coronary artery disease, hypertension, hyperlipidemia, COPD, OSA, hx CVA, CKD stage 4, SIADH, diabetes mellitus who presented to the ED on 11/28/2023 for worsening and persistent SOB and lower extremity swelling with concurrent COPD exacerbation and found elevated BNP of 2500. Cardiology was consulted for further evaluation.   # ESRD (Plan to start dialysis on 06/16) Permacath placement on 06/17 by Dr. Vonna Guardian and underwent 1st hemodialysis today. -Nephrology following, appreciate  recommendations.   # Acute HFpEF exacerbation # COPD exacerbation  # OSA Patient presents with worsening SOB and LE swelling. BNP elevated at 2450. Echo this admission with pEF, no RWMA, grade II diastolic dysfunction.  -Continue metoprolol  succinate 100 mg daily.  -Continue losartan  as stated below.  -Fluid removal via dialysis.  -GDMT optimization limited due to ESRD.  -Recommend sleep study for OSA if CPAP indicated.  -Recommend breathing treatments and frequent use of incentive spirometry for COPD. Management per primary.  # Coronary artery disease # Hypertension # Hyperlipidemia # Demand ischemia Patient without chest pain. Trops minimally elevated and flat 19 > 20. EKG without acute ischemic changes. Echo with no RWMA.   -Continue ASA 81 mg, rosuvastatin  5 mg daily -Continue amlodipine  10 mg daily.  -Continue losartan  100 mg daily. -Continue metoprolol  as stated above. -Minimally elevated and flat trop sin setting of ESRD, heart failure exacerbation is most consistent with demand/supply mismatch and not ACS.  This patient's plan of care was discussed and created with Dr. Beau Bound and he is in agreement.  Signed: Creighton Doffing, PA-C  12/02/2023, 1:51 PM [facility]

## 2023-12-02 NOTE — Progress Notes (Signed)
 OT Cancellation Note  Patient Details Name: Mckenzie Lawrence MRN: [id number] DOB: [birth year]   Cancelled Treatment:    Reason Eval/Treat Not Completed: Patient at procedure or test/ unavailable. Consult received, chart reviewed. Pt receiving dialysis. Noted hyperkalemia (K+5.7). Per MD progress note, to be managed with HD. Will re-attempt OT evaluation next date as medically appropriate and as pt is available.   Kyera Felan R., MPH, MS, OTR/L ascom [phone] 12/02/23, 7:59 AM

## 2023-12-02 NOTE — Progress Notes (Addendum)
 [facility]  ROUNDING NOTE   Subjective:  [name] is a 78 y.o.  female with past medical history of hypertension, long-standing diabetes mellitus type 2, lower extremity edema, hyperlipidemia, tobacco abuse, prior history of CVA, COPD, obstructive sleep apnea, lumbar spinal fusion, left upper lobe lung mass who presents with volume overload and increasing shortness of breath.  Update: Patient underwent first hemodialysis treatment today.  Tolerated well.   Objective:  Vital signs in last 24 hours:  Temp:  [97.1 F (36.2 C)-98.2 F (36.8 C)] 98.2 F (36.8 C) (06/17 1136) Pulse Rate:  [64-76] 76 (06/17 1136) Resp:  [11-21] 18 (06/17 1135) BP: (158-182)/(55-71) 182/67 (06/17 1136) SpO2:  [96 %-100 %] 99 % (06/17 1136) Weight:  [73.4 kg-78.8 kg] 73.4 kg (06/17 1028)  Weight change: -1.3 kg Filed Weights   12/02/23 0500 12/02/23 0752 12/02/23 1028  Weight: 78.8 kg 73.4 kg 73.4 kg    Intake/Output: I/O last 3 completed shifts: In: 250 [P.O.:200; IV Piggyback:50] Out: 550 [Urine:550]   Intake/Output this shift:  No intake/output data recorded.  Physical Exam: General: Ill-appearing, Uremic  Head: Normocephalic, atraumatic. Dry mucosal membranes  Eyes: Anicteric,    Neck: Supple,   Lungs:  Normal breathing effort  Heart: Regular rate and rhythm  Abdomen:  Soft, nontender,   Extremities: No peripheral edema.  Neurologic: Nonfocal, moving all four extremities  Skin: No lesions  Dialysis Access: Right IJ PermCath    Basic Metabolic Panel: Recent Labs  Lab 11/28/23 0246 11/28/23 1452 11/28/23 1620 11/29/23 0600 11/29/23 1451 11/29/23 1831 11/30/23 0235 11/30/23 0236 12/01/23 0416 12/02/23 0430  NA 123*   < > 122* 125*  --  125* 124* 125* 127* 127*  K 5.2*   < > 5.0 5.6* 5.1  --  5.3*  --  5.3* 5.7*  CL 94*  --  93* 96*  --   --  95*  --  97* 96*  CO2 19*  --  18* 19*  --   --  20*  --  20* 20*  GLUCOSE 207*  --  324* 282*  --   --  312*   --  237* 331*  BUN 67*  --  66* 73*  --   --  82*  --  82* 87*  CREATININE 3.59*  --  3.77* 3.95*  --   --  4.09*  --  4.02* 4.22*  CALCIUM  8.8*  --  8.5* 8.5*  --   --  8.2*  --  8.4* 8.3*  MG 2.2  --   --   --   --   --   --   --   --   --    < > = values in this interval not displayed.    Liver Function Tests: Recent Labs  Lab 11/28/23 0246 11/28/23 1620  AST 17 22  ALT 11 11  ALKPHOS 95 88  BILITOT 0.7 0.7  PROT 6.7 6.2*  ALBUMIN 2.9* 2.6*   No results for input(s): LIPASE, AMYLASE in the last 168 hours. No results for input(s): AMMONIA in the last 168 hours.  CBC: Recent Labs  Lab 11/28/23 0246 11/29/23 0600 11/30/23 0235 12/01/23 0416 12/02/23 0430  WBC 11.1* 14.6* 14.8* 14.5* 10.3  NEUTROABS 9.7*  --   --   --   --   HGB 9.0* 7.9* 7.7* 7.4* 8.2*  HCT 27.4* 23.6* 23.1* 22.6* 24.2*  MCV 92.3 90.8 90.9 91.5 91.0  PLT 292 253  271 236 229    Cardiac Enzymes: No results for input(s): CKTOTAL, CKMB, CKMBINDEX, TROPONINI in the last 168 hours.  BNP: Invalid input(s): POCBNP  CBG: Recent Labs  Lab 12/01/23 0824 12/01/23 1243 12/01/23 1558 12/01/23 2139 12/02/23 1137  GLUCAP 180* 156* 246* 359* 249*    Microbiology: Results for orders placed or performed during the hospital encounter of 08/28/23  Blood culture (routine single)     Status: None   Collection Time: 08/28/23 12:40 PM   Specimen: BLOOD  Result Value Ref Range Status   Specimen Description BLOOD RIGHT ANTECUBITAL  Final   Special Requests   Final    BOTTLES DRAWN AEROBIC AND ANAEROBIC Blood Culture adequate volume   Culture   Final    NO GROWTH 5 DAYS Performed at [facility], [address], [place], [place] [address]    Report Status 09/02/2023 FINAL  Final  Blood culture (routine single)     Status: None   Collection Time: 08/28/23 12:40 PM   Specimen: BLOOD  Result Value Ref Range Status   Specimen Description BLOOD LEFT ANTECUBITAL  Final   Special  Requests   Final    BOTTLES DRAWN AEROBIC AND ANAEROBIC Blood Culture adequate volume   Culture   Final    NO GROWTH 5 DAYS Performed at [facility], [address] [place], [place] [address]    Report Status 09/02/2023 FINAL  Final    Coagulation Studies: No results for input(s): LABPROT, INR in the last 72 hours.  Urinalysis: No results for input(s): COLORURINE, LABSPEC, PHURINE, GLUCOSEU, HGBUR, BILIRUBINUR, KETONESUR, PROTEINUR, UROBILINOGEN, NITRITE, LEUKOCYTESUR in the last 72 hours.  Invalid input(s): APPERANCEUR    Imaging: PERIPHERAL VASCULAR CATHETERIZATION Result Date: 12/01/2023 See surgical note for result.    Medications:    anticoagulant sodium citrate      amLODipine   10 mg Oral Daily   aspirin  EC  81 mg Oral QHS   benzonatate  200 mg Oral TID   Chlorhexidine  Gluconate Cloth  6 each Topical Q0600   diazepam   10 mg Oral Q1500   guaiFENesin   600 mg Oral BID   [START ON 12/03/2023] heparin  injection (subcutaneous)  5,000 Units Subcutaneous Q8H   insulin  aspart  0-5 Units Subcutaneous QHS   insulin  aspart  0-9 Units Subcutaneous TID WC   insulin  aspart  3 Units Subcutaneous TID WC   insulin  glargine-yfgn  12 Units Subcutaneous BID   losartan   100 mg Oral QHS   metoprolol  succinate  100 mg Oral QHS   nicotine   21 mg Transdermal Daily   rosuvastatin   5 mg Oral Daily   sodium bicarbonate   650 mg Oral BID   acetaminophen  **OR** acetaminophen , albuterol , anticoagulant sodium citrate, guaiFENesin -dextromethorphan, heparin , hydrALAZINE , ondansetron  **OR** ondansetron  (ZOFRAN ) IV, oxyCODONE -acetaminophen , oxyCODONE -acetaminophen , sucralfate , zolpidem   Assessment/ Plan:  [name] is a 78 y.o.  female  with past medical history of hypertension, long-standing diabetes mellitus type 2, lower extremity edema, hyperlipidemia, tobacco abuse, prior history of CVA, COPD, obstructive sleep apnea, lumbar spinal fusion, left  upper lobe lung mass who presents with volume overload and increasing shortness of breath.   1.  Acute kidney injury/chronic kidney disease stage IV/diabetes mellitus type 2 with chronic kidney disease/proteinuria.  eGFR down to 11.  Baseline creatinine 2.7 in February 2025.  Creatinine increased to 3.6-4 in June 2025. - PermCath has been placed  (12/01/2023) - First hemodialysis later today.  2 hours.  Next dialysis planned for tomorrow (Wednesday) -  Patient would like to go to [facility] dialysis center as that is closest to her house.  Care management to assist with discharge planning.  Lab Results  Component Value Date   CREATININE 4.22 (H) 12/02/2023   CREATININE 4.02 (H) 12/01/2023   CREATININE 4.09 (H) 11/30/2023     Intake/Output Summary (Last 24 hours) at 12/02/2023 1252 Last data filed at 12/02/2023 1028 Gross per 24 hour  Intake 250 ml  Output 550 ml  Net -300 ml       2.  Hyponatremia.  Differential includes underlying emphysema, lung nodules, AKI Expected to improved with dialysis.  3.  Hyperkalemia Expected to improve with hemodialysis.   4.  HTN - 2D echo from 11/29/2023 shows LVEF 55 to 60%, grade 2 diastolic dysfunction, trivial mitral regurgitation Currently blood pressure is managed with amlodipine , losartan , metoprolol  Blood pressure is staying elevated.  Will add clonidine   5.  Anemia of chronic kidney disease.  Hemoglobin 7.9 Patient has  left upper lobe mass concerning for malignancy.  Discussed with Dr. Rosebud Confer.  Okay to use ESA.  Consider transfusion for HgB<7     LOS: 4 Shilah Hefel 6/17/202512:52 PM

## 2023-12-02 NOTE — Procedures (Signed)
 HD Note:  Some information was entered later than the data was gathered due to patient care needs. The stated time with the data is accurate.  Received report from off going HD nurse approximately one hour into patient treatment.  Alert and oriented.    Access used: Right upper chest HD catheter Access issues: None  Patient tolerated treatment well.   TX duration: 2.0  Alert, without acute distress.  Fluid exchange was kept even per order  Hand-off given to patient's nurse.   Transported back to the room   Claudell Rhody L. Alva Jewels, RN Kidney Dialysis Unit.

## 2023-12-02 NOTE — Plan of Care (Signed)
  Problem: Education: Goal: Knowledge of General Education information will improve Description: Including pain rating scale, medication(s)/side effects and non-pharmacologic comfort measures Outcome: Progressing   Problem: Clinical Measurements: Goal: Respiratory complications will improve Outcome: Progressing   Problem: Clinical Measurements: Goal: Cardiovascular complication will be avoided Outcome: Progressing   Problem: Elimination: Goal: Will not experience complications related to urinary retention Outcome: Progressing   Problem: Pain Managment: Goal: General experience of comfort will improve and/or be controlled Outcome: Progressing

## 2023-12-02 NOTE — Progress Notes (Signed)
 PT Cancellation Note  Patient Details Name: Mckenzie Lawrence MRN: [id number] DOB: 07/22/1945   Cancelled Treatment:    Reason Eval/Treat Not Completed: Patient at procedure or test/unavailable (Pt OTF for HD. Will attempt evaluation again at later date/time as able.)  2:06 PM, 12/02/23 Mckenzie Lawrence, PT, DPT Physical Therapist - [facility]  [phone] (ASCOM)    Mckenzie Lawrence C 12/02/2023, 2:06 PM

## 2023-12-02 NOTE — Evaluation (Signed)
 Physical Therapy Evaluation Patient Details Name: Mckenzie Lawrence MRN: [id number] DOB: [birth year] Today's Date: 12/02/2023  History of Present Illness  78 y/o female presented to ED on 11/28/23 for worsening SOB and chest pressure. Admitted for ESRD and acute CHF exacerbation. PMH: CAD, HTN, COPD, OSA, hx of CVA, CKD stage 4, SIADH, T2DM  Clinical Impression  Patient admitted with the above. PTA, patient lives with husband (who has dementia and she provides supervision due to cognition) and was independent with no AD until recently where she has been using RW due to LE weakness. Patient presents with weakness, impaired balance, and decreased activity tolerance. Performed sit to stand from low bed surface and toilet with minA to power up. Ambulated 180' with RW and CGA for safety, no LOB noted. Patient reporting fatigue following HD. Performed ADLs and patient appreciative of therapist time to allow her to freshen up. Patient will benefit from skilled PT services during acute stay to address listed deficits. Patient will benefit from ongoing therapy at discharge to maximize functional independence and safety.       If plan is discharge home, recommend the following: A little help with walking and/or transfers;A little help with bathing/dressing/bathroom;Assistance with cooking/housework;Assist for transportation;Help with stairs or ramp for entrance   Can travel by private vehicle        Equipment Recommendations None recommended by PT  Recommendations for Other Services       Functional Status Assessment Patient has had a recent decline in their functional status and demonstrates the ability to make significant improvements in function in a reasonable and predictable amount of time.     Precautions / Restrictions Precautions Precautions: Fall Recall of Precautions/Restrictions: Intact Restrictions Weight Bearing Restrictions Per Provider Order: No      Mobility  Bed Mobility Overal  bed mobility: Needs Assistance Bed Mobility: Supine to Sit, Sit to Supine     Supine to sit: Supervision Sit to supine: Min assist   General bed mobility comments: assist for LE management back into bed    Transfers Overall transfer level: Needs assistance Equipment used: 1 person hand held assist Transfers: Sit to/from Stand Sit to Stand: Min assist                Ambulation/Gait Ambulation/Gait assistance: Contact guard assist Gait Distance (Feet): 180 Feet Assistive device: Rolling Kerensa Nicklas (2 wheels) Gait Pattern/deviations: Step-through pattern, Decreased stride length, Trunk flexed Gait velocity: decreased     General Gait Details: CGA for safety. No LOB noted. patient unsteady initially and benefitted from [employer] Mobility     Tilt Bed    Modified Rankin (Stroke Patients Only)       Balance Overall balance assessment: Needs assistance Sitting-balance support: No upper extremity supported, Feet supported Sitting balance-Leahy Scale: Good     Standing balance support: Bilateral upper extremity supported, During functional activity Standing balance-Leahy Scale: Fair                               Pertinent Vitals/Pain Pain Assessment Pain Assessment: No/denies pain    Home Living Family/patient expects to be discharged to:: Private residence Living Arrangements: Spouse/significant other Available Help at Discharge: Family;Available 24 hours/day Type of Home: House Home Access: Stairs to enter Entrance Stairs-Rails: Right;Left;Can reach both Entrance Stairs-Number of Steps: 4-5   Home Layout: One level Home Equipment: BSC/3in1;Shower seat -  built in;Grab bars - toilet;Grab bars - tub/shower;Rollator (4 wheels);Rolling Wilkin Lippy (2 wheels);Hand held shower head;Adaptive equipment;Other (comment) Additional Comments: provides supervision for husband who has dementia    Prior Function Prior Level of Function  : Independent/Modified Independent             Mobility Comments: ambulatory without AD previously. Denies falls. Most recently utilizing RW for mobiltiy due to LE weakness ADLs Comments: indep     Extremity/Trunk Assessment   Upper Extremity Assessment Upper Extremity Assessment: Defer to OT evaluation    Lower Extremity Assessment Lower Extremity Assessment: Generalized weakness    Cervical / Trunk Assessment Cervical / Trunk Assessment: Kyphotic  Communication   Communication Communication: No apparent difficulties    Cognition Arousal: Alert Behavior During Therapy: WFL for tasks assessed/performed   PT - Cognitive impairments: No apparent impairments                         Following commands: Intact       Cueing       General Comments General comments (skin integrity, edema, etc.): VSS on RA    Exercises     Assessment/Plan    PT Assessment Patient needs continued PT services  PT Problem List Decreased strength;Decreased activity tolerance;Decreased balance;Decreased mobility;Cardiopulmonary status limiting activity       PT Treatment Interventions DME instruction;Gait training;Functional mobility training;Therapeutic activities;Therapeutic exercise;Balance training;Stair training;Neuromuscular re-education;Patient/family education    PT Goals (Current goals can be found in the Care Plan section)  Acute Rehab PT Goals Patient Stated Goal: to feel better PT Goal Formulation: With patient Time For Goal Achievement: 12/16/23 Potential to Achieve Goals: Good    Frequency Min 2X/week     Co-evaluation               AM-PAC PT 6 Clicks Mobility  Outcome Measure Help needed turning from your back to your side while in a flat bed without using bedrails?: A Little Help needed moving from lying on your back to sitting on the side of a flat bed without using bedrails?: A Little Help needed moving to and from a bed to a chair  (including a wheelchair)?: A Little Help needed standing up from a chair using your arms (e.g., wheelchair or bedside chair)?: A Little Help needed to walk in hospital room?: A Little Help needed climbing 3-5 steps with a railing? : A Little 6 Click Score: 18    End of Session   Activity Tolerance: Patient tolerated treatment well Patient left: in bed;with call bell/phone within reach;with bed alarm set Nurse Communication: Mobility status PT Visit Diagnosis: Unsteadiness on feet (R26.81);Muscle weakness (generalized) (M62.81);Other abnormalities of gait and mobility (R26.89)    Time: 1445-1511 PT Time Calculation (min) (ACUTE ONLY): 26 min   Charges:   PT Evaluation $PT Eval Low Complexity: 1 Low PT Treatments $Therapeutic Activity: 8-22 mins PT General Charges $$ ACUTE PT VISIT: 1 Visit         Janine Melbourne, PT, DPT Physical Therapist - [facility]  [facility]   Markesia Crilly A Jamine Wingate 12/02/2023, 3:15 PM

## 2023-12-02 NOTE — Progress Notes (Signed)
 \ PROGRESS NOTE   HPI was taken from Dr. Vallarie Gauze: Mckenzie Lawrence is a 78 y.o. female with medical history significant for COPD, CVA, stage 4 CKD, SIADH, esophageal dysphagia treated with Botox  (last in 05/2023), DM, HLD, and HTN, last hospitalized from 2/1 to 07/26/2023 with MRSA bacteremia/sepsis/multifocal pneumonia, now presenting by EMS with shortness of breath and chest pressure.  Daughter at bedside gives history and states that her mother was in her usual state of health until earlier in the day on 6/12 when she developed shortness of breath while getting an MRI on her back.  His symptoms seem to improve however on the morning of 6/13 she awoke at 2 AM gasping for breath and called EMS.  She states patient's blood pressure had been running high lately due to ongoing back pain and she had also complained of swelling in her legs.  She has no orthopnea and sleeps on 1 pillow.  She has a chronic congested cough that has not changed and has no fever or chills  BP on arrival 202/74 and tachypneic to 25 saturating at 98% on room air.  Afebrile and pulse in the 70s.  Labs notable for WBC 11,000 with hemoglobin of 9 which is her baseline. Troponin 19 and BNP 2450 Creatinine 3.59 up from baseline of 2.74 with bicarb 19.  Glucose 207, potassium 5.2, sodium 123 (137 four months ago) EKG with sinus rhythm at 79 Chest x-ray showing small right pleural effusion and vascular congestion Bilateral lower extremity venous ultrasound ordered-result pending. Patient treated with DuoNeb and Solu-Medrol  and given a dose of Lasix Hospitalist consulted for admission for COPD exacerbation and possible new onset CHF.      Mckenzie Lawrence  WUJ:[id number] DOB: Jul 04, 1945 DOA: 11/28/2023 PCP: Lyle San, MD   Assessment & Plan:   Principal Problem:   Hypertensive urgency/emergency Active Problems:   COPD with acute exacerbation (HCC)   Acute dyspnea   CAD (coronary artery disease)   Chest pain   Acute renal  failure superimposed on stage 4 chronic kidney disease (HCC)   Hyponatremia   Hypertensive emergency  Assessment and Plan: Hypertensive urgency/emergency: resolved emergency but still w/ HTN, poorly controlled.  Continue on metoprolol , losartan , amlodipine . IV hydralazine  prn   Acute diastolic CHF: w/ elevated BNP, CXR showing vascular congestion, pitting LE edema & dyspnea. Monitor I/Os. Continue on metoprolol , losartan . Fluid/volume management w/ HD. Echo shows EF 50-55%, grade II diastolic dysfunction, no regional wall motion abnormalities. Cardio following and recs apprec    COPD exacerbation: continue on steroids, tessalon, bronchodilators & encourage incentive spirometry.   Chest pain: w/ hx of CAD. Troponins are minimally elevated. EKG shows no ischemic changes. Continue on losartan , metoprolol , statin, aspirin . No CP currently   AKI on CKDIV: Cr is labile. S/p tunneled HD cath placed. Started on HD 12/02/23. Will need outpatient HD spot prior to d/c.  Nephro following and recs apprec   Metabolic acidosis: likely secondary to AKI on CKD. Continue on sodium bicarb  Anxiety: severity unknown. Increase home dose of valium  as per pt's request   Hyperkalemia: will managed w/ HD     Hyponatremia: w/ hx of SIADH. Labile. Will be managed w/ HD. Nephro following and recs apprec   ACD: likely secondary to CKD. H&H are trending up today   Acute diarrhea: x 3 days. Resolved as per pt    Esophageal dysphagia: s/p botox  via EGD in 08/2022. Aspiration precautions. Continue w/ supportive care   OSA: CPAP qhs  DM2: well controlled, HbA1c 6.1. Continue on glargine, SSI w/ accuchecks   Insomnia: continue on home dose of ambien       DVT prophylaxis: lovenox  Code Status: full  Family Communication: discussed pt's care w/ pt's daughter at bedside and answered her questions  Disposition Plan: likely d/c back home w/ HH   Level of care: Progressive  Status is: Inpatient Remains inpatient  appropriate because: severity of illness, started HD today and will need outpatient HD spot prior to d/c     Consultants:  Nephro   Procedures:   Antimicrobials:   Subjective: Pt c/o malaise   Objective: Vitals:   12/02/23 0757 12/02/23 0817 12/02/23 0830 12/02/23 0900  BP: (!) 181/71 (!) 167/68 (!) 165/63 (!) 161/55  Pulse: 70 64 66 64  Resp: 11 15 13 14   Temp: 97.6 F (36.4 C)     TempSrc: Oral     SpO2: 98% 98% 99% 99%  Weight:      Height:        Intake/Output Summary (Last 24 hours) at 12/02/2023 0913 Last data filed at 12/02/2023 0445 Gross per 24 hour  Intake 250 ml  Output 550 ml  Net -300 ml    Filed Weights   12/01/23 0500 12/02/23 0500 12/02/23 0752  Weight: 80.1 kg 78.8 kg 73.4 kg    Examination:  General exam: appears comfortable   Respiratory system: diminished breath sounds b/l  Cardiovascular system: S1/S2+. No rubs or gallops  Gastrointestinal system: abd is soft, NT, ND & hypoactive bowel sounds  Central nervous system: alert & oriented. Moves all extremities  Psychiatry: judgement and insight appears at baseline. Flat mood and affect    Data Reviewed: I have personally reviewed following labs and imaging studies  CBC: Recent Labs  Lab 11/28/23 0246 11/29/23 0600 11/30/23 0235 12/01/23 0416 12/02/23 0430  WBC 11.1* 14.6* 14.8* 14.5* 10.3  NEUTROABS 9.7*  --   --   --   --   HGB 9.0* 7.9* 7.7* 7.4* 8.2*  HCT 27.4* 23.6* 23.1* 22.6* 24.2*  MCV 92.3 90.8 90.9 91.5 91.0  PLT 292 253 271 236 229   Basic Metabolic Panel: Recent Labs  Lab 11/28/23 0246 11/28/23 1452 11/28/23 1620 11/29/23 0600 11/29/23 1451 11/29/23 1831 11/30/23 0235 11/30/23 0236 12/01/23 0416 12/02/23 0430  NA 123*   < > 122* 125*  --  125* 124* 125* 127* 127*  K 5.2*   < > 5.0 5.6* 5.1  --  5.3*  --  5.3* 5.7*  CL 94*  --  93* 96*  --   --  95*  --  97* 96*  CO2 19*  --  18* 19*  --   --  20*  --  20* 20*  GLUCOSE 207*  --  324* 282*  --   --  312*  --   237* 331*  BUN 67*  --  66* 73*  --   --  82*  --  82* 87*  CREATININE 3.59*  --  3.77* 3.95*  --   --  4.09*  --  4.02* 4.22*  CALCIUM  8.8*  --  8.5* 8.5*  --   --  8.2*  --  8.4* 8.3*  MG 2.2  --   --   --   --   --   --   --   --   --    < > = values in this interval not displayed.   GFR: Estimated Creatinine Clearance: 11  mL/min (A) (by C-G formula based on SCr of 4.22 mg/dL (H)). Liver Function Tests: Recent Labs  Lab 11/28/23 0246 11/28/23 1620  AST 17 22  ALT 11 11  ALKPHOS 95 88  BILITOT 0.7 0.7  PROT 6.7 6.2*  ALBUMIN 2.9* 2.6*   No results for input(s): LIPASE, AMYLASE in the last 168 hours. No results for input(s): AMMONIA in the last 168 hours. Coagulation Profile: No results for input(s): INR, PROTIME in the last 168 hours. Cardiac Enzymes: No results for input(s): CKTOTAL, CKMB, CKMBINDEX, TROPONINI in the last 168 hours. BNP (last 3 results) No results for input(s): PROBNP in the last 8760 hours. HbA1C: No results for input(s): HGBA1C in the last 72 hours. CBG: Recent Labs  Lab 11/30/23 2102 12/01/23 0824 12/01/23 1243 12/01/23 1558 12/01/23 2139  GLUCAP 399* 180* 156* 246* 359*   Lipid Profile: No results for input(s): CHOL, HDL, LDLCALC, TRIG, CHOLHDL, LDLDIRECT in the last 72 hours. Thyroid Function Tests: No results for input(s): TSH, T4TOTAL, FREET4, T3FREE, THYROIDAB in the last 72 hours. Anemia Panel: No results for input(s): VITAMINB12, FOLATE, FERRITIN, TIBC, IRON, RETICCTPCT in the last 72 hours. Sepsis Labs: No results for input(s): PROCALCITON, LATICACIDVEN in the last 168 hours.  No results found for this or any previous visit (from the past 240 hours).       Radiology Studies: PERIPHERAL VASCULAR CATHETERIZATION Result Date: 12/01/2023 See surgical note for result.       Scheduled Meds:  amLODipine   10 mg Oral Daily   aspirin  EC  81 mg Oral QHS   benzonatate   200 mg Oral TID   Chlorhexidine  Gluconate Cloth  6 each Topical Q0600   diazepam   10 mg Oral Q1500   enoxaparin  (LOVENOX ) injection  30 mg Subcutaneous Q24H   guaiFENesin   600 mg Oral BID   insulin  aspart  0-5 Units Subcutaneous QHS   insulin  aspart  0-9 Units Subcutaneous TID WC   insulin  glargine-yfgn  10 Units Subcutaneous BID   losartan   100 mg Oral QHS   metoprolol  succinate  100 mg Oral QHS   nicotine   21 mg Transdermal Daily   predniSONE   40 mg Oral Q breakfast   rosuvastatin   5 mg Oral Daily   sodium bicarbonate   650 mg Oral BID   Continuous Infusions:  anticoagulant sodium citrate       LOS: 4 days       Alphonsus Jeans, MD [facility] Pager [phone]  If 7PM-7AM, please contact night-coverage [email or web address] 12/02/2023, 9:13 AM

## 2023-12-02 NOTE — TOC Progression Note (Addendum)
 Transition of Care [facility]) - Progression Note    Patient Details  Name: Mckenzie Lawrence MRN: [id number] Date of Birth: 15-Aug-1945  [facility]) CM/SW Contact  Baird Bombard, RN Phone Number: 12/02/2023, 9:48 AM  Clinical Narrative:    Per message received by MD patient's family requesting information regarding home care services.   Attempt to reach patient's daughter, [name]. No answer. Left a message.   10:21am Retrieved incoming call from patient's daughter, [name]. She is requesting information for home care and home health. She was advised a list for home health provider that accept patient's insurance can be provided.   11:30am Spoke with patient at bedside. RNCM tile and role was provided to patient and her daughter. Patient is agreeable to [facility]. She was provided a list of agency that accept her insurance. She and her daughter will review the list to determine which agency they would like to choose. Patient inquired about home care. She was advised this service is not coordinated by TOC. Patient and her daughter have both been advised HH can be arranged. Patient daughter stated a list of home care provider have been provided int he past. RNCM reiterated policy regarding coordinated for home care.           Expected Discharge Plan and Services                                               Social Determinants of Health (SDOH) Interventions SDOH Screenings   Food Insecurity: No Food Insecurity (11/28/2023)  Housing: Low Risk  (11/28/2023)  Transportation Needs: No Transportation Needs (11/28/2023)  Utilities: Not At Risk (11/28/2023)  Depression (PHQ2-9): Low Risk  (08/05/2023)  Financial Resource Strain: Low Risk  (10/31/2023)   Received from [facility]  Social Connections: Unknown (11/28/2023)  Tobacco Use: High Risk (11/28/2023)    Readmission Risk Interventions    07/22/2023   10:56 AM 08/13/2022    1:04 PM 11/10/2021    2:49 PM   Readmission Risk Prevention Plan  Transportation Screening Complete Complete Complete  PCP or Specialist Appt within 5-7 Days  Complete   PCP or Specialist Appt within 3-5 Days Complete  Complete  Home Care Screening  Complete   Medication Review (RN CM)  Complete   HRI or Home Care Consult Complete  Complete  Social Work Consult for Recovery Care Planning/Counseling Complete  Complete  Palliative Care Screening Not Applicable  Not Applicable  Medication Review Oceanographer) Complete  Complete

## 2023-12-02 NOTE — Inpatient Diabetes Management (Signed)
 Inpatient Diabetes Program Recommendations  AACE/ADA: New Consensus Statement on Inpatient Glycemic Control   Target Ranges:  Prepandial:   less than 140 mg/dL      Peak postprandial:   less than 180 mg/dL (1-2 hours)      Critically ill patients:  140 - 180 mg/dL    Latest Reference Range & Units 12/02/23 04:30  Glucose 70 - 99 mg/dL 147 (H)    Latest Reference Range & Units 12/01/23 08:24 12/01/23 12:43 12/01/23 15:58 12/01/23 21:39  Glucose-Capillary 70 - 99 mg/dL 829 (H) 562 (H) 130 (H) 359 (H)   Review of Glycemic Control  Current orders for Inpatient glycemic control: Semglee  10 units BID, Novolog  0-9 units TID with meals, Novolog  0-5 units at bedtime; Prednisone  40 mg QAM   Inpatient Diabetes Program Recommendations:     Insulin : In reviewing chart, noted patient did NOT receive morning dose of Semglee  10 units yesterday.  As a result CBG 359 mg/dl at 86:57 and lab glucose 331 mg/dl today.   If steroids are continued, please consider increasing Semglee  to 12 units BID and ordering Novolog  3 units TID with meals for meal coverage if patient eats at least 50% of meals.   Thanks, Beacher Limerick, RN, MSN, CDCES Diabetes Coordinator Inpatient Diabetes Program [phone] (Team Pager from 8am to 5pm)

## 2023-12-03 DIAGNOSIS — I160 Hypertensive urgency: Secondary | ICD-10-CM | POA: Diagnosis not present

## 2023-12-03 LAB — BASIC METABOLIC PANEL WITH GFR
Anion gap: 8 (ref 5–15)
BUN: 61 mg/dL — ABNORMAL HIGH (ref 8–23)
CO2: 24 mmol/L (ref 22–32)
Calcium: 8.1 mg/dL — ABNORMAL LOW (ref 8.9–10.3)
Chloride: 98 mmol/L (ref 98–111)
Creatinine, Ser: 3.19 mg/dL — ABNORMAL HIGH (ref 0.44–1.00)
GFR, Estimated: 15 mL/min — ABNORMAL LOW (ref >60)
Glucose, Bld: 224 mg/dL — ABNORMAL HIGH (ref 70–99)
Potassium: 4.7 mmol/L (ref 3.5–5.1)
Sodium: 130 mmol/L — ABNORMAL LOW (ref 135–145)

## 2023-12-03 LAB — CBC
HCT: 23.6 % — ABNORMAL LOW (ref 36.0–46.0)
Hemoglobin: 7.8 g/dL — ABNORMAL LOW (ref 12.0–15.0)
MCH: 29.8 pg (ref 26.0–34.0)
MCHC: 33.1 g/dL (ref 30.0–36.0)
MCV: 90.1 fL (ref 80.0–100.0)
Platelets: 222 10*3/uL (ref 150–400)
RBC: 2.62 MIL/uL — ABNORMAL LOW (ref 3.87–5.11)
RDW: 14.4 % (ref 11.5–15.5)
WBC: 12.1 10*3/uL — ABNORMAL HIGH (ref 4.0–10.5)
nRBC: 0 % (ref 0.0–0.2)

## 2023-12-03 LAB — GLUCOSE, CAPILLARY
Glucose-Capillary: 211 mg/dL — ABNORMAL HIGH (ref 70–99)
Glucose-Capillary: 102 mg/dL — ABNORMAL HIGH (ref 70–99)
Glucose-Capillary: 148 mg/dL — ABNORMAL HIGH (ref 70–99)

## 2023-12-03 NOTE — Progress Notes (Signed)
 [facility]  ROUNDING NOTE   Subjective:  Mckenzie Lawrence is a 78 y.o.  female with past medical history of hypertension, long-standing diabetes mellitus type 2, lower extremity edema, hyperlipidemia, tobacco abuse, prior history of CVA, COPD, obstructive sleep apnea, lumbar spinal fusion, left upper lobe lung mass who presents with volume overload and increasing shortness of breath.   Update: Patient due for second dialysis treatment today. Outpatient hemodialysis placement pending.   Objective:  Vital signs in last 24 hours:  Temp:  [97.3 F (36.3 C)-98.8 F (37.1 C)] 98.8 F (37.1 C) (06/18 1507) Pulse Rate:  [61-75] 72 (06/18 1509) Resp:  [17-21] 17 (06/18 1509) BP: (137-170)/(47-59) 144/52 (06/18 1509) SpO2:  [94 %-98 %] 94 % (06/18 1509) Weight:  [76.3 kg-78.7 kg] 76.3 kg (06/18 1509)  Weight change: -5.4 kg Filed Weights   12/03/23 0500 12/03/23 1227 12/03/23 1509  Weight: 78.7 kg 76.3 kg 76.3 kg    Intake/Output: I/O last 3 completed shifts: In: 440 [P.O.:440] Out: 870 [Urine:870]   Intake/Output this shift:  Total I/O In: 0  Out: 150 [Urine:150]  Physical Exam: General: No acute distress  Head: Normocephalic, atraumatic. Dry mucosal membranes  Eyes: Anicteric  Neck: Supple  Lungs:  Normal breathing effort  Heart: Regular rate and rhythm  Abdomen:  Soft, nontender, BS present  Extremities: No peripheral edema.  Neurologic: Nonfocal, moving all four extremities  Skin: No lesions  Dialysis Access: Right IJ PermCath    Basic Metabolic Panel: Recent Labs  Lab 11/28/23 0246 11/28/23 1452 11/29/23 0600 11/29/23 1451 11/29/23 1831 11/30/23 0235 11/30/23 0236 12/01/23 0416 12/02/23 0430 12/03/23 0352  NA 123*   < > 125*  --    < > 124* 125* 127* 127* 130*  K 5.2*   < > 5.6* 5.1  --  5.3*  --  5.3* 5.7* 4.7  CL 94*   < > 96*  --   --  95*  --  97* 96* 98  CO2 19*   < > 19*  --   --  20*  --  20* 20* 24  GLUCOSE 207*   < > 282*  --    --  312*  --  237* 331* 224*  BUN 67*   < > 73*  --   --  82*  --  82* 87* 61*  CREATININE 3.59*   < > 3.95*  --   --  4.09*  --  4.02* 4.22* 3.19*  CALCIUM  8.8*   < > 8.5*  --   --  8.2*  --  8.4* 8.3* 8.1*  MG 2.2  --   --   --   --   --   --   --   --   --    < > = values in this interval not displayed.    Liver Function Tests: Recent Labs  Lab 11/28/23 0246 11/28/23 1620  AST 17 22  ALT 11 11  ALKPHOS 95 88  BILITOT 0.7 0.7  PROT 6.7 6.2*  ALBUMIN 2.9* 2.6*   No results for input(s): LIPASE, AMYLASE in the last 168 hours. No results for input(s): AMMONIA in the last 168 hours.  CBC: Recent Labs  Lab 11/28/23 0246 11/29/23 0600 11/30/23 0235 12/01/23 0416 12/02/23 0430 12/03/23 0352  WBC 11.1* 14.6* 14.8* 14.5* 10.3 12.1*  NEUTROABS 9.7*  --   --   --   --   --   HGB 9.0* 7.9* 7.7* 7.4*  8.2* 7.8*  HCT 27.4* 23.6* 23.1* 22.6* 24.2* 23.6*  MCV 92.3 90.8 90.9 91.5 91.0 90.1  PLT 292 253 271 236 229 222    Cardiac Enzymes: No results for input(s): CKTOTAL, CKMB, CKMBINDEX, TROPONINI in the last 168 hours.  BNP: Invalid input(s): POCBNP  CBG: Recent Labs  Lab 12/02/23 1611 12/02/23 2015 12/02/23 2208 12/02/23 2347 12/03/23 0823  GLUCAP 299* 431* 420* 364* 211*    Microbiology: Results for orders placed or performed during the hospital encounter of 08/28/23  Blood culture (routine single)     Status: None   Collection Time: 08/28/23 12:40 PM   Specimen: BLOOD  Result Value Ref Range Status   Specimen Description BLOOD RIGHT ANTECUBITAL  Final   Special Requests   Final    BOTTLES DRAWN AEROBIC AND ANAEROBIC Blood Culture adequate volume   Culture   Final    NO GROWTH 5 DAYS Performed at [facility], [address], [place], [place] [address]    Report Status 09/02/2023 FINAL  Final  Blood culture (routine single)     Status: None   Collection Time: 08/28/23 12:40 PM   Specimen: BLOOD  Result Value Ref Range Status    Specimen Description BLOOD LEFT ANTECUBITAL  Final   Special Requests   Final    BOTTLES DRAWN AEROBIC AND ANAEROBIC Blood Culture adequate volume   Culture   Final    NO GROWTH 5 DAYS Performed at [facility], [address] [place], [place] [address]    Report Status 09/02/2023 FINAL  Final    Coagulation Studies: No results for input(s): LABPROT, INR in the last 72 hours.  Urinalysis: No results for input(s): COLORURINE, LABSPEC, PHURINE, GLUCOSEU, HGBUR, BILIRUBINUR, KETONESUR, PROTEINUR, UROBILINOGEN, NITRITE, LEUKOCYTESUR in the last 72 hours.  Invalid input(s): APPERANCEUR    Imaging: No results found.    Medications:    anticoagulant sodium citrate      amLODipine   10 mg Oral Daily   aspirin  EC  81 mg Oral QHS   benzonatate  200 mg Oral TID   Chlorhexidine  Gluconate Cloth  6 each Topical Q0600   cloNIDine  0.1 mg Oral BID   diazepam   10 mg Oral Q1500   epoetin  alfa-epbx (RETACRIT ) injection  4,000 Units Intravenous Q M,W,F-1800   guaiFENesin   600 mg Oral BID   heparin  injection (subcutaneous)  5,000 Units Subcutaneous Q8H   insulin  aspart  0-5 Units Subcutaneous QHS   insulin  aspart  0-9 Units Subcutaneous TID WC   insulin  aspart  3 Units Subcutaneous TID WC   insulin  glargine-yfgn  12 Units Subcutaneous BID   losartan   100 mg Oral QHS   metoprolol  succinate  100 mg Oral QHS   nicotine   21 mg Transdermal Daily   rosuvastatin   5 mg Oral Daily   sodium bicarbonate   650 mg Oral BID   acetaminophen  **OR** acetaminophen , albuterol , anticoagulant sodium citrate, guaiFENesin -dextromethorphan, heparin , hydrALAZINE , ondansetron  **OR** ondansetron  (ZOFRAN ) IV, oxyCODONE -acetaminophen , oxyCODONE -acetaminophen , sucralfate , zolpidem   Assessment/ Plan:  Mckenzie Lawrence is a 77 y.o.  female  with past medical history of hypertension, long-standing diabetes mellitus type 2, lower extremity edema, hyperlipidemia, tobacco abuse, prior  history of CVA, COPD, obstructive sleep apnea, lumbar spinal fusion, left upper lobe lung mass who presents with volume overload and increasing shortness of breath.   1.  Acute kidney injury/chronic kidney disease stage IV/diabetes mellitus type 2 with chronic kidney disease/proteinuria.  eGFR down to 11.  Baseline creatinine 2.7 in  February 2025.  Creatinine increased to 3.6-4 in June 2025. - PermCath has been placed  (12/01/2023) - Patient to undergo second dialysis session today. - Patient would like to go to [facility] dialysis center as that is closest to her house.  Care management to assist with discharge planning.  Lab Results  Component Value Date   CREATININE 3.19 (H) 12/03/2023   CREATININE 4.22 (H) 12/02/2023   CREATININE 4.02 (H) 12/01/2023     Intake/Output Summary (Last 24 hours) at 12/03/2023 1535 Last data filed at 12/03/2023 1509 Gross per 24 hour  Intake 240 ml  Output 470 ml  Net -230 ml       2.  Hyponatremia.  Differential includes underlying emphysema, lung nodules, AKI Serum sodium up to 130.  Continue to monitor.  3.  Hyperkalemia Potassium down to 0.7 and acceptable.   4.  HTN - 2D echo from 11/29/2023 shows LVEF 55 to 60%, grade 2 diastolic dysfunction, trivial mitral regurgitation Continue amlodipine , losartan , metoprolol , and clonidine.   5.  Anemia of chronic kidney disease.  Hemoglobin 7.9 Patient has  left upper lobe mass concerning for malignancy.  Discussed with Dr. Rosebud Confer.  Okay to use ESA.  Consider transfusion for HgB<7     LOS: 5 Petrice Beedy 6/18/20253:35 PM

## 2023-12-03 NOTE — TOC Progression Note (Signed)
 Transition of Care [facility]) - Progression Note    Patient Details  Name: Mckenzie Lawrence MRN: [id number] Date of Birth: September 28, 1945  Transition of [facility]) CM/SW Contact  Baird Bombard, RN Phone Number: 12/03/2023, 11:06 AM  Clinical Narrative:    Spoke with patient's daughter, [name] regarding choice for [facility]. [name] stated she has not had time to review the list provided for [facility] agencies since she has to research home care providers. I haven't had the time.         Expected Discharge Plan and Services                                               Social Determinants of Health (SDOH) Interventions SDOH Screenings   Food Insecurity: No Food Insecurity (11/28/2023)  Housing: Low Risk  (11/28/2023)  Transportation Needs: No Transportation Needs (11/28/2023)  Utilities: Not At Risk (11/28/2023)  Depression (PHQ2-9): Low Risk  (08/05/2023)  Financial Resource Strain: Low Risk  (10/31/2023)   Received from [facility]  Social Connections: Unknown (11/28/2023)  Tobacco Use: High Risk (11/28/2023)    Readmission Risk Interventions    07/22/2023   10:56 AM 08/13/2022    1:04 PM 11/10/2021    2:49 PM  Readmission Risk Prevention Plan  Transportation Screening Complete Complete Complete  PCP or Specialist Appt within 5-7 Days  Complete   PCP or Specialist Appt within 3-5 Days Complete  Complete  Home Care Screening  Complete   Medication Review (RN CM)  Complete   HRI or Home Care Consult Complete  Complete  Social Work Consult for Recovery Care Planning/Counseling Complete  Complete  Palliative Care Screening Not Applicable  Not Applicable  Medication Review Oceanographer) Complete  Complete

## 2023-12-03 NOTE — Inpatient Diabetes Management (Signed)
 Inpatient Diabetes Program Recommendations  AACE/ADA: New Consensus Statement on Inpatient Glycemic Control   Target Ranges:  Prepandial:   less than 140 mg/dL      Peak postprandial:   less than 180 mg/dL (1-2 hours)      Critically ill patients:  140 - 180 mg/dL    Latest Reference Range & Units 12/03/23 03:52  Glucose 70 - 99 mg/dL 161 (H)    Latest Reference Range & Units 12/02/23 11:37 12/02/23 16:11 12/02/23 20:15 12/02/23 22:08 12/02/23 23:47  Glucose-Capillary 70 - 99 mg/dL 096 (H) 045 (H) 409 (H) 420 (H) 364 (H)   Review of Glycemic Control  Current orders for Inpatient glycemic control: Semglee  12 units BID, Novolog  0-9 units TID with meals, Novolog  0-5 units at bedtime, Novolog  3 units TID with meals; Prednisone  40 mg QAM   Inpatient Diabetes Program Recommendations:     Insulin : If steroids are continued as ordered, please consider increasing Semglee  to 14 units BID and meal coverage to 6 units TID with meals.  Thanks, Beacher Limerick, RN, MSN, CDCES Diabetes Coordinator Inpatient Diabetes Program [phone] (Team Pager from 8am to 5pm)

## 2023-12-03 NOTE — Procedures (Signed)
 HD Note:  Some information was entered later than the data was gathered due to patient care needs. The stated time with the data is accurate.  Received patient in bed to unit.   Alert and oriented. Patient stated she did not sleep well last night  Informed consent signed and in chart.   Access used: upper right chest HD catheter Access issues: None  Patient tolerated treatment well.   TX duration: 2.5 hours  Alert, without acute distress.  Patient was kept even with fluid during treatment per MD orders  Hand-off given to patient's nurse.   Transported back to the room   Lestat Golob L. Alva Jewels, RN Kidney Dialysis Unit.

## 2023-12-03 NOTE — Progress Notes (Signed)
 [facility]INIC CARDIOLOGY PROGRESS NOTE       Patient ID: Mckenzie Lawrence MRN: [id number] DOB/AGE: 08/08/1945 78 y.o.  Admit date: 11/28/2023 Referring Physician Dr. Brion Cancel Primary Physician Lyle San, MD Primary Cardiologist Dr. Parks Bollman Reason for Consultation diastolic heart failure with SOB.  HPI: Mckenzie Lawrence is a 78 y.o. female  with a past medical history of coronary artery disease, hypertension, hyperlipidemia, COPD, OSA, hx CVA, CKD stage 4, SIADH, diabetes mellitus who presented to the ED on 11/28/2023 for worsening and persistent SOB and lower extremity swelling with concurrent COPD exacerbation and found elevated BNP of 2500. Cardiology was consulted for further evaluation.   Interval History: -Patient seen and examined this afternoon and laying comfortably in dialysis. Patient states she feels tired and isn't sleeping well. Reports she feels her SOB is worse today. Continues to deny chest pain. -Patients BP elevated and HR stable. Overnight Tele showed no significant events.  -Patient remains on room air with stable SpO2.  -Underwent 1st hemodialysis treatment on 06/18 and tolerated well. Currently starting 2nd dialysis [place] today.    Review of systems complete and found to be negative unless listed above    Past Medical History:  Diagnosis Date   Anemia    Arthritis    Bladder incontinence    Broken foot    Cataracts, bilateral    Chronic kidney insufficiency    stage 3b   COPD (chronic obstructive pulmonary disease) (HCC)    wheezing   Coronary artery disease 04/25/2022   in [place]   COVID 2021   very mild case   CVA (cerebral vascular accident) (HCC) 2016   has had 3 strokes, states right side is slightlyweaker than left   Diabetes mellitus    insulin  dependent, Type 2   GERD (gastroesophageal reflux disease)    HLD (hyperlipidemia)    Hx of cardiovascular stress test    a. ETT (6/13):  Ex 5:13; no ischemic changes   Hypertension    controlled  on meds   Lacunar stroke of left subthalamic region [facility]) 02/2015   Leg pain    left   Lower back pain    Neuromuscular disorder (HCC)    stroke right hand tingling   Orthostatic hypotension    Osteopenia 01/2017   T score -2.0 stable from prior DEXA   Pancreatitis 10/2021   PCOS (polycystic ovarian syndrome)    Personal history of tobacco use, presenting hazards to health 01/09/2015   PONV (postoperative nausea and vomiting)    Sleep apnea    uses CPAP nightly    Past Surgical History:  Procedure Laterality Date   BIOPSY  08/28/2022   Procedure: BIOPSY;  Surgeon: Felecia Hopper, MD;  Location: [facility];  Service: Gastroenterology;;   BOTOX  INJECTION  01/15/2022   Procedure: BOTOX  INJECTION;  Surgeon: Ozell Blunt, MD;  Location: [facility];  Service: Gastroenterology;;   BOTOX  INJECTION N/A 08/28/2022   Procedure: BOTOX  INJECTION;  Surgeon: Felecia Hopper, MD;  Location: [facility];  Service: Gastroenterology;  Laterality: N/A;   BOTOX  INJECTION Bilateral 05/27/2023   Procedure: BOTOX  INJECTION;  Surgeon: Ozell Blunt, MD;  Location: [facility];  Service: Gastroenterology;  Laterality: Bilateral;   BROW LIFT Bilateral 11/04/2017   Procedure: BLEPHAROPLASTY UPPER EYELID W/EXCESS SKIN;  Surgeon: Zacarias Hermann, MD;  Location: [facility];  Service: Ophthalmology;  Laterality: Bilateral;  DIABETES-insulin  dependent uses CPAP   CARDIAC CATHETERIZATION  20 yrs ago   found nothing   CARPAL TUNNEL  RELEASE Bilateral    CATARACT EXTRACTION Bilateral    DIALYSIS/PERMA CATHETER INSERTION N/A 12/01/2023   Procedure: DIALYSIS/PERMA CATHETER INSERTION;  Surgeon: Celso College, MD;  Location: [facility];  Service: Cardiovascular;  Laterality: N/A;   ELBOW SURGERY Bilateral    ESOPHAGOGASTRODUODENOSCOPY N/A 07/25/2021   Procedure: ESOPHAGOGASTRODUODENOSCOPY (EGD);  Surgeon: Toledo, Alphonsus Jeans, MD;  Location: [facility];  Service: Gastroenterology;  Laterality: N/A;   IDDM   ESOPHAGOGASTRODUODENOSCOPY N/A 01/15/2022   Procedure: ESOPHAGOGASTRODUODENOSCOPY (EGD);  Surgeon: Ozell Blunt, MD;  Location: [facility];  Service: Gastroenterology;  Laterality: N/A;  botox    ESOPHAGOGASTRODUODENOSCOPY (EGD) WITH PROPOFOL  N/A 08/28/2022   Procedure: ESOPHAGOGASTRODUODENOSCOPY (EGD) WITH PROPOFOL ;  Surgeon: Felecia Hopper, MD;  Location: [facility];  Service: Gastroenterology;  Laterality: N/A;   ESOPHAGOGASTRODUODENOSCOPY (EGD) WITH PROPOFOL  Bilateral 05/27/2023   Procedure: ESOPHAGOGASTRODUODENOSCOPY (EGD) WITH PROPOFOL ;  Surgeon: Ozell Blunt, MD;  Location: [facility];  Service: Gastroenterology;  Laterality: Bilateral;   FOOT SURGERY     Groin Abscess     HAND SURGERY     KNEE SURGERY Bilateral    LABIAL ABSCESS     LUMBAR LAMINECTOMY/DECOMPRESSION MICRODISCECTOMY Left 05/11/2018   Procedure: LUMBAR LAMINECTOMY/DECOMPRESSION MICRODISCECTOMY 1 LEVEL- L4-5;  Surgeon: Berta Brittle, MD;  Location: [facility];  Service: Neurosurgery;  Laterality: Left;   LUMBAR LAMINECTOMY/DECOMPRESSION MICRODISCECTOMY Left 05/20/2022   Procedure: MICRODISCECTOMY L3-4;  Surgeon: Garry Kansas, MD;  Location: [facility] OR;  Service: Neurosurgery;  Laterality: Left;  3C   LUMBAR WOUND DEBRIDEMENT N/A 08/08/2022   Procedure: INCISION AND DRAINAGE OF LUMBAR WOUND;  Surgeon: Garry Kansas, MD;  Location: [facility] OR;  Service: Neurosurgery;  Laterality: N/A;  3C   OOPHORECTOMY     BSO   PUBO VAG SLING     SHOULDER SURGERY     bilateral arthroscopies   TEE WITHOUT CARDIOVERSION N/A 07/25/2023   Procedure: TRANSESOPHAGEAL ECHOCARDIOGRAM (TEE);  Surgeon: Dorita Garter, MD;  Location: [facility];  Service: Cardiovascular;  Laterality: N/A;   VAGINAL HYSTERECTOMY  1979    Medications Prior to Admission  Medication Sig Dispense Refill Last Dose/Taking   acetaminophen  (TYLENOL ) 500 MG tablet Take 1,000 mg by mouth every 6 (six) hours as needed for moderate pain.   Unknown   albuterol  (VENTOLIN  HFA)  108 (90 Base) MCG/ACT inhaler Inhale 2 puffs into the lungs every 6 (six) hours as needed for wheezing or shortness of breath.   Unknown   aspirin  EC 81 MG tablet Take 81 mg by mouth at bedtime. Swallow whole.   Taking   calcitRIOL  (ROCALTROL ) 0.25 MCG capsule Take 0.25 mcg by mouth at bedtime.   Taking   cholecalciferol (VITAMIN D3) 25 MCG (1000 UNIT) tablet Take 1,000 Units by mouth daily.   Taking   cyclobenzaprine  (FLEXERIL ) 10 MG tablet Take 1 tablet (10 mg total) by mouth 3 (three) times daily as needed for muscle spasms. 30 tablet 0 Taking As Needed   D-MANNOSE PO Take 2 tablets by mouth at bedtime.   Taking   diazepam  (VALIUM ) 5 MG tablet Take 5 mg by mouth 2 (two) times daily as needed.   Taking As Needed   diphenhydrAMINE (BENADRYL) 25 MG tablet Take 50 mg by mouth at bedtime as needed for sleep.   Taking As Needed   docusate sodium  (COLACE) 100 MG capsule Take 1 capsule (100 mg total) by mouth 2 (two) times daily. (Patient taking differently: Take 100 mg by mouth at bedtime as needed for mild constipation or moderate constipation.) 30 capsule 0  Taking Differently   estradiol  (ESTRACE ) 0.1 MG/GM vaginal cream Place 1 Applicatorful vaginally 3 (three) times a week.   Taking   Fe Fum-Vit C-Vit B12-FA (TRIGELS-F FORTE) CAPS capsule Take 1 capsule by mouth daily after breakfast. 90 capsule 0 Taking   insulin  glargine-yfgn (SEMGLEE ) 100 UNIT/ML Pen Inject 10 Units into the skin 2 (two) times daily.   Taking   losartan  (COZAAR ) 100 MG tablet Take 1 tablet (100 mg total) by mouth daily. (Patient taking differently: Take 100 mg by mouth at bedtime.) 30 tablet 2 Taking Differently   metoprolol  succinate (TOPROL -XL) 100 MG 24 hr tablet Take 100 mg by mouth at bedtime.   Taking   Multiple Vitamins-Minerals (PRESERVISION AREDS 2+MULTI VIT PO) Take 1 capsule by mouth daily.   Taking   nitrofurantoin  (MACRODANTIN ) 100 MG capsule Take 100 mg by mouth at bedtime.   Taking   rosuvastatin  (CRESTOR ) 5 MG  tablet Take 5 mg by mouth daily.   Taking   sennosides-docusate sodium  (SENOKOT-S) 8.6-50 MG tablet Take 1 tablet by mouth daily as needed for constipation.   Taking As Needed   sodium bicarbonate  650 MG tablet Take 1 tablet (650 mg total) by mouth 2 (two) times daily. (Patient taking differently: Take 650 mg by mouth daily.) 100 tablet 1 Taking Differently   sucralfate  (CARAFATE ) 1 g tablet Take 1 tablet (1 g total) by mouth 2 (two) times daily as needed (acid reflux).   Taking As Needed   Vibegron (GEMTESA) 75 MG TABS Take 75 mg by mouth at bedtime.   Taking   zolpidem  (AMBIEN ) 10 MG tablet Take 10 mg by mouth at bedtime as needed for sleep.   Taking As Needed   amLODipine  (NORVASC ) 10 MG tablet Take 1 tablet (10 mg total) by mouth daily. (Patient not taking: Reported on 11/28/2023) 30 tablet 2 Not Taking   BD INSULIN  SYRINGE U/F 31G X 5/16 1 ML MISC USE 1 SYRINGE AS DIRECTED      Continuous Glucose Sensor (FREESTYLE LIBRE 2 SENSOR) MISC       epoetin  alfa-epbx (RETACRIT ) 4000 UNIT/ML injection Inject 1 mL (4,000 Units total) into the skin once a week. (Patient not taking: Reported on 11/28/2023) 4 mL 3 Not Taking   feeding supplement (BOOST HIGH PROTEIN) LIQD Take 1 Container by mouth 2 (two) times daily between meals.      insulin  detemir (LEVEMIR ) 100 UNIT/ML injection Inject 100-120 Units into the skin daily. (Patient not taking: Reported on 11/28/2023)   Not Taking   nystatin  (MYCOSTATIN ) 100000 UNIT/ML suspension Take 5 mLs (500,000 Units total) by mouth 4 (four) times daily. (Patient not taking: Reported on 11/28/2023) 120 mL 1 Not Taking   ONETOUCH ULTRA test strip 4 (four) times daily.      Social History   Socioeconomic History   Marital status: Married    Spouse name: Not on file   Number of children: 1   Years of education: Not on file   Highest education level: Not on file  Occupational History   Not on file  Tobacco Use   Smoking status: Every Day    Current packs/day: 1.50     Average packs/day: 1.5 packs/day for 50.0 years (75.0 ttl pk-yrs)    Types: Cigarettes   Smokeless tobacco: Never   Tobacco comments:    3ppd x 10 year, then cut back to 1.5pdd since 02/2015  Vaping Use   Vaping status: Never Used  Substance and Sexual Activity   Alcohol use:  Never    Alcohol/week: 0.0 standard drinks of alcohol   Drug use: No   Sexual activity: Not Currently    Birth control/protection: Surgical    Comment: Hx Hysterectomy, 1st intercourse 78 yo-Fewer than 5 partners  Other Topics Concern   Not on file  Social History Narrative   Lives at home with husband in a one story home.  Has 1 daughter.     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     Education: some college.   Social Drivers of Corporate investment banker Strain: Low Risk  (10/31/2023)   Received from [facility]   Overall Financial Resource Strain (CARDIA)    Difficulty of Paying Living Expenses: Not hard at all  Food Insecurity: No Food Insecurity (11/28/2023)   Hunger Vital Sign    Worried About Running Out of Food in the Last Year: Never true    Ran Out of Food in the Last Year: Never true  Transportation Needs: No Transportation Needs (11/28/2023)   PRAPARE - Administrator, Civil Service (Medical): No    Lack of Transportation (Non-Medical): No  Physical Activity: Not on file  Stress: Not on file  Social Connections: Unknown (11/28/2023)   Social Connection and Isolation Panel    Frequency of Communication with Friends and Family: More than three times a week    Frequency of Social Gatherings with Friends and Family: More than three times a week    Attends Religious Services: More than 4 times per year    Active Member of [employer] or Organizations: Not on file    Attends Banker Meetings: Not on file    Marital Status: Not on file  Intimate Partner Violence: Not At Risk (11/28/2023)   Humiliation, Afraid, Rape, and Kick questionnaire    Fear  of Current or Ex-Partner: No    Emotionally Abused: No    Physically Abused: No    Sexually Abused: No    Family History  Problem Relation Age of Onset   Hypertension Mother    Heart disease Mother 53       MI   Diabetes Father    Diabetes Sister    Hypertension Sister    Diabetes Brother    Hypertension Brother    Heart disease Brother 85       CAD   Cancer Sister        Multiple myloma   Diabetes Brother      Vitals:   12/03/23 0500 12/03/23 0827 12/03/23 1227 12/03/23 1235  BP:  (!) 163/58 (!) 170/56 (!) 164/56  Pulse:  61 75 68  Resp:   (!) 21 (!) 21  Temp:  97.6 F (36.4 C) 98.7 F (37.1 C)   TempSrc:   Oral   SpO2:  97% 95% 98%  Weight: 78.7 kg  76.3 kg   Height:        PHYSICAL EXAM General: well appearing elderly female, well nourished, in no acute distress. HEENT: Normocephalic and atraumatic. Neck: No JVD.   Lungs: Normal respiratory effort on room air. Diminished breath sounds bilaterally, bibasilar crackles.  Heart: HRRR. Normal S1 and S2 without gallops or murmurs.  Abdomen: Non-distended appearing.  Msk: Normal strength and tone for age. Extremities: Warm and well perfused. No clubbing, cyanosis. 1+ pitting edema bilaterally.  Neuro: Alert and oriented X 3. Psych: Answers questions appropriately.   Labs: Basic Metabolic Panel: Recent Labs    12/02/23 0430  12/03/23 0352  NA 127* 130*  K 5.7* 4.7  CL 96* 98  CO2 20* 24  GLUCOSE 331* 224*  BUN 87* 61*  CREATININE 4.22* 3.19*  CALCIUM  8.3* 8.1*   Liver Function Tests: No results for input(s): AST, ALT, ALKPHOS, BILITOT, PROT, ALBUMIN in the last 72 hours.  No results for input(s): LIPASE, AMYLASE in the last 72 hours. CBC: Recent Labs    12/02/23 0430 12/03/23 0352  WBC 10.3 12.1*  HGB 8.2* 7.8*  HCT 24.2* 23.6*  MCV 91.0 90.1  PLT 229 222   Cardiac Enzymes: No results for input(s): CKTOTAL, CKMB, CKMBINDEX, TROPONINIHS in the last 72 hours. BNP: No  results for input(s): BNP in the last 72 hours. D-Dimer: No results for input(s): DDIMER in the last 72 hours. Hemoglobin A1C: No results for input(s): HGBA1C in the last 72 hours. Fasting Lipid Panel: No results for input(s): CHOL, HDL, LDLCALC, TRIG, CHOLHDL, LDLDIRECT in the last 72 hours. Thyroid Function Tests: No results for input(s): TSH, T4TOTAL, T3FREE, THYROIDAB in the last 72 hours.  Invalid input(s): FREET3 Anemia Panel: No results for input(s): VITAMINB12, FOLATE, FERRITIN, TIBC, IRON, RETICCTPCT in the last 72 hours.   Radiology: PERIPHERAL VASCULAR CATHETERIZATION Result Date: 12/01/2023 See surgical note for result.  ECHOCARDIOGRAM COMPLETE Result Date: 11/29/2023    ECHOCARDIOGRAM REPORT   Patient Name:   Mckenzie Lawrence Date of Exam: 11/29/2023 Medical Rec #:  [id number]      Height:       67.0 in Accession #:    [id number]     Weight:       176.6 lb Date of Birth:  January 01, 1946      BSA:          1.918 m Patient Age:    78 years       BP:           163/51 mmHg Patient Gender: F              HR:           81 bpm. Exam Location:  [facility] Procedure: 2D Echo, Cardiac Doppler and Color Doppler (Both Spectral and Color            Flow Doppler were utilized during procedure). Indications:     CHF I50.31  History:         Patient has prior history of Echocardiogram examinations, most                  recent 07/23/2023.  Sonographer:     Clenton Czech RDCS, FASE Referring Phys:  [id number] Alphonsus Jeans Diagnosing Phys: Dwayne D Callwood MD IMPRESSIONS  1. Left ventricular ejection fraction, by estimation, is 55 to 60%. The left ventricle has normal function. The left ventricle has no regional wall motion abnormalities. The left ventricular internal cavity size was moderately dilated. Left ventricular diastolic parameters are consistent with Grade II diastolic dysfunction (pseudonormalization).  2. Right ventricular systolic function is normal. The right  ventricular size is normal.  3. Left atrial size was mildly dilated.  4. The mitral valve is normal in structure. Trivial mitral valve regurgitation.  5. The aortic valve is normal in structure. Aortic valve regurgitation is not visualized. FINDINGS  Left Ventricle: Left ventricular ejection fraction, by estimation, is 55 to 60%. The left ventricle has normal function. The left ventricle has no regional wall motion abnormalities. Strain was performed and the global longitudinal strain is indeterminate. The left ventricular internal cavity  size was moderately dilated. There is no left ventricular hypertrophy. Left ventricular diastolic parameters are consistent with Grade II diastolic dysfunction (pseudonormalization). Right Ventricle: The right ventricular size is normal. No increase in right ventricular wall thickness. Right ventricular systolic function is normal. Left Atrium: Left atrial size was mildly dilated. Right Atrium: Right atrial size was normal in size. Pericardium: There is no evidence of pericardial effusion. Mitral Valve: The mitral valve is normal in structure. Trivial mitral valve regurgitation. Tricuspid Valve: The tricuspid valve is normal in structure. Tricuspid valve regurgitation is mild. Aortic Valve: The aortic valve is normal in structure. Aortic valve regurgitation is not visualized. Aortic valve peak gradient measures 8.8 mmHg. Pulmonic Valve: The pulmonic valve was normal in structure. Pulmonic valve regurgitation is not visualized. Aorta: The ascending aorta was not well visualized. IAS/Shunts: No atrial level shunt detected by color flow Doppler. Additional Comments: 3D was performed not requiring image post processing on an independent workstation and was indeterminate.  LEFT VENTRICLE PLAX 2D LVIDd:         5.50 cm     Diastology LVIDs:         3.80 cm     LV e' medial:    7.18 cm/s LV PW:         1.00 cm     LV E/e' medial:  19.9 LV IVS:        1.10 cm     LV e' lateral:   8.38 cm/s  LVOT diam:     1.80 cm     LV E/e' lateral: 17.1 LV SV:         63 LV SV Index:   33 LVOT Area:     2.54 cm  LV Volumes (MOD) LV vol d, MOD A2C: 74.1 ml LV vol d, MOD A4C: 70.9 ml LV vol s, MOD A2C: 37.0 ml LV vol s, MOD A4C: 28.8 ml LV SV MOD A2C:     37.1 ml LV SV MOD A4C:     70.9 ml LV SV MOD BP:      41.0 ml RIGHT VENTRICLE RV Basal diam:  3.20 cm RV S prime:     14.80 cm/s TAPSE (M-mode): 2.6 cm LEFT ATRIUM             Index        RIGHT ATRIUM           Index LA diam:        4.30 cm 2.24 cm/m   RA Area:     19.70 cm LA Vol (A2C):   44.9 ml 23.41 ml/m  RA Volume:   55.40 ml  28.88 ml/m LA Vol (A4C):   53.8 ml 28.05 ml/m LA Biplane Vol: 51.9 ml 27.06 ml/m  AORTIC VALVE                 PULMONIC VALVE AV Area (Vmax): 1.84 cm     PV Vmax:        1.01 m/s AV Vmax:        148.00 cm/s  PV Peak grad:   4.1 mmHg AV Peak Grad:   8.8 mmHg     RVOT Peak grad: 3 mmHg LVOT Vmax:      107.00 cm/s LVOT Vmean:     76.000 cm/s LVOT VTI:       0.249 m  AORTA Ao Root diam: 2.90 cm MITRAL VALVE MV Area (PHT): 4.86 cm     SHUNTS MV Decel Time: 156 msec  Systemic VTI:  0.25 m MV E velocity: 143.00 cm/s  Systemic Diam: 1.80 cm MV A velocity: 80.30 cm/s MV E/A ratio:  1.78 Mckenzie Batters MD Electronically signed by Mckenzie Batters MD Signature Date/Time: 11/29/2023/10:33:04 AM    Final    US  Venous Img Lower Bilateral Result Date: 11/28/2023 CLINICAL DATA:  New onset left greater than right lower extremity swelling, without findings of CHF. EXAM: BILATERAL LOWER EXTREMITY VENOUS DOPPLER ULTRASOUND TECHNIQUE: Gray-scale sonography with graded compression, as well as color Doppler and duplex ultrasound were performed to evaluate the lower extremity deep venous systems from the level of the common femoral vein and including the common femoral, femoral, profunda femoral, popliteal and calf veins including the posterior tibial, peroneal and gastrocnemius veins when visible. The superficial great saphenous vein was also  interrogated. Spectral Doppler was utilized to evaluate flow at rest and with distal augmentation maneuvers in the common femoral, femoral and popliteal veins. COMPARISON:  Left lower extremity DVT exam 08/26/2019. FINDINGS: RIGHT LOWER EXTREMITY Common Femoral Vein: No evidence of thrombus. Normal compressibility, respiratory phasicity and response to augmentation. Saphenofemoral Junction: No evidence of thrombus. Normal compressibility and flow on color Doppler imaging. Profunda Femoral Vein: No evidence of thrombus. Normal compressibility and flow on color Doppler imaging. Femoral Vein: No evidence of thrombus. Normal compressibility, respiratory phasicity and response to augmentation. Popliteal Vein: No evidence of thrombus. Normal compressibility, respiratory phasicity and response to augmentation. Calf Veins: No evidence of thrombus. Normal compressibility and flow on color Doppler imaging. Superficial Great Saphenous Vein: No evidence of thrombus. Normal compressibility. Venous Reflux:  None. Other Findings:  Pulsatile venous waveforms. LEFT LOWER EXTREMITY Common Femoral Vein: No evidence of thrombus. Normal compressibility, respiratory phasicity and response to augmentation. Saphenofemoral Junction: No evidence of thrombus. Normal compressibility and flow on color Doppler imaging. Profunda Femoral Vein: No evidence of thrombus. Normal compressibility and flow on color Doppler imaging. Femoral Vein: No evidence of thrombus. Normal compressibility, respiratory phasicity and response to augmentation. Popliteal Vein: No evidence of thrombus. Normal compressibility, respiratory phasicity and response to augmentation. Calf Veins: No evidence of thrombus. Normal compressibility and flow on color Doppler imaging. Superficial Great Saphenous Vein: No evidence of thrombus. Normal compressibility. Venous Reflux:  None. Other Findings:  Pulsatile venous waveforms. IMPRESSION: 1. No evidence of deep venous thrombosis in  either lower extremity. 2. Pulsatile venous waveforms, which can be seen with right heart failure or tricuspid regurgitation. Electronically Signed   By: Denman Fischer M.D.   On: 11/28/2023 04:56   DG Chest Portable 1 View Result Date: 11/28/2023 CLINICAL DATA:  Difficulty breathing EXAM: PORTABLE CHEST 1 VIEW COMPARISON:  07/19/2023 FINDINGS: Cardiac shadow is mildly enlarged. Aortic calcifications are seen. Mild vascular congestion is noted. Small right-sided pleural effusion is noted. Metallic densities are noted symmetrically over the upper chest likely artifactual in nature. IMPRESSION: Small right pleural effusion and vascular congestion. Electronically Signed   By: Violeta Grey M.D.   On: 11/28/2023 02:59    ECHO as above  TELEMETRY reviewed by me 12/03/2023: sinus rhythm with PACs, rate 70s  EKG reviewed by me: Normal sinus rhythm, mild right axis nonspecific ST-T wave changes   Data reviewed by me 12/03/2023: last 24h vitals tele labs imaging I/O hospitalist progress note, nephrology notes.  Principal Problem:   Hypertensive urgency/emergency Active Problems:   Hyponatremia   CAD (coronary artery disease)   COPD with acute exacerbation (HCC)   Acute renal failure superimposed on stage 4 chronic kidney disease (  HCC)   Chest pain   Hypertensive emergency   Acute dyspnea   Encounter for dialysis catheter care [facility])    ASSESSMENT AND PLAN:  CHAYNA SURRATT is a 78 y.o. female  with a past medical history of coronary artery disease, hypertension, hyperlipidemia, COPD, OSA, hx CVA, CKD stage 4, SIADH, diabetes mellitus who presented to the ED on 11/28/2023 for worsening and persistent SOB and lower extremity swelling with concurrent COPD exacerbation and found elevated BNP of 2500. Cardiology was consulted for further evaluation.   # ESRD (Plan to start dialysis on 06/16) Permacath placement on 06/17 by Dr. Vonna Guardian and underwent 1st hemodialysis on 06/17. Having another dialysis treatment  today.  -Nephrology following, appreciate recommendations.   # Acute HFpEF exacerbation # COPD exacerbation  # OSA Patient presents with worsening SOB and LE swelling. BNP elevated at 2450. Echo this admission with pEF, no RWMA, grade II diastolic dysfunction.  -Continue metoprolol  succinate 100 mg daily.  -Continue losartan  as stated below.  -Fluid removal via dialysis.  -GDMT optimization limited due to ESRD.  -Recommend sleep study for OSA if CPAP indicated.  -Recommend breathing treatments and frequent use of incentive spirometry for COPD. Management per primary. -Recommend pulmonology consultation.   # Coronary artery disease # Hypertension # Hyperlipidemia # Demand ischemia Patient without chest pain. Trops minimally elevated and flat 19 > 20. EKG without acute ischemic changes. Echo with no RWMA.   -Continue ASA 81 mg, rosuvastatin  5 mg daily -Continue amlodipine  10 mg daily.  -Continue losartan  100 mg daily. -Continue clonidine 0.1 mg BID.  -Continue metoprolol  as stated above. -Minimally elevated and flat trop sin setting of ESRD, heart failure exacerbation is most consistent with demand/supply mismatch and not ACS.  This patient's plan of care was discussed and created with Dr. Beau Bound and he is in agreement.  Signed: Creighton Doffing, PA-C  12/03/2023, 12:55 PM [facility]

## 2023-12-03 NOTE — Evaluation (Signed)
 Occupational Therapy Evaluation Patient Details Name: Mckenzie Lawrence MRN: [id number] DOB: April 30, 1946 Today's Date: 12/03/2023   History of Present Illness   78 y/o female presented to ED on 11/28/23 for worsening SOB and chest pressure. Admitted for ESRD and acute CHF exacerbation. PMH: CAD, HTN, COPD, OSA, hx of CVA, CKD stage 4, SIADH, T2DM     Clinical Impressions Pt was seen for OT evaluation this date. PTA, pt resides at home with her spouse (has dementia) who she provides supervision for. Pt is IND with all tasks, but recently began having LBP and edema in BLEs causing her to use a RW. Pt presents to acute OT demonstrating impaired ADL performance and functional mobility 2/2 weakness, low activity tolerance, and pain. Pt currently requires supervision via seated lateral leans and figure four to perform LB dressing. She performed full shower seated on BSC with HH shower head with SBA from daughter.  Supervision for STS from chair with arms and CGA for ambulation back to her bed. Pt notes fatigue with activity and needs increased time for tasks. Pt would benefit from skilled OT services to address noted impairments and functional limitations to maximize safety and independence while minimizing falls risk and caregiver burden. Do anticipate the need for follow up OT services upon acute hospital DC.      If plan is discharge home, recommend the following:   A little help with walking and/or transfers;A little help with bathing/dressing/bathroom;Help with stairs or ramp for entrance;Assistance with cooking/housework     Functional Status Assessment   Patient has had a recent decline in their functional status and demonstrates the ability to make significant improvements in function in a reasonable and predictable amount of time.     Equipment Recommendations   None recommended by OT;Other (comment) (has needed equipment)     Recommendations for Other Services          Precautions/Restrictions   Precautions Precautions: Fall Recall of Precautions/Restrictions: Intact Restrictions Weight Bearing Restrictions Per Provider Order: No     Mobility Bed Mobility               General bed mobility comments: not assessed, pt found seated on chair in bathroom with daughter on entry-left seated EOB at end of session    Transfers Overall transfer level: Needs assistance Equipment used: Rolling walker (2 wheels) Transfers: Sit to/from Stand Sit to Stand: Supervision           General transfer comment: supervision for STS from chair with arms to RW in bathroom, ambulated back to bed using RW with CGA/SBA and no LOB; moving slower than usual but no LOB      Balance Overall balance assessment: Needs assistance Sitting-balance support: No upper extremity supported, Feet supported Sitting balance-Leahy Scale: Good Sitting balance - Comments: steady for LB dressing   Standing balance support: Bilateral upper extremity supported, During functional activity Standing balance-Leahy Scale: Fair Standing balance comment: CGA for standing to don underwear when pulling over hips without UE support                           ADL either performed or assessed with clinical judgement   ADL Overall ADL's : Needs assistance/impaired                     Lower Body Dressing: Supervision/safety;Sitting/lateral leans Lower Body Dressing Details (indicate cue type and reason): increased time/effort to don mesh underwear and  bil socks seated in chair via figure four, but no physical assist provided Toilet Transfer: Supervision/safety;Rolling walker (2 wheels) Toilet Transfer Details (indicate cue type and reason): simulated from chair in bathroom         Functional mobility during ADLs: Contact guard assist;Rolling walker (2 wheels);Supervision/safety       Vision         Perception         Praxis         Pertinent  Vitals/Pain Pain Assessment Pain Assessment: No/denies pain     Extremity/Trunk Assessment Upper Extremity Assessment Upper Extremity Assessment: Generalized weakness   Lower Extremity Assessment Lower Extremity Assessment: Generalized weakness   Cervical / Trunk Assessment Cervical / Trunk Assessment: Kyphotic   Communication Communication Communication: No apparent difficulties   Cognition Arousal: Alert Behavior During Therapy: WFL for tasks assessed/performed                                 Following commands: Intact       Cueing  General Comments          Exercises Other Exercises Other Exercises: Edu on role of OT in acute setting, DME use, sponge bathing with temp cath/chest port   Shoulder Instructions      Home Living Family/patient expects to be discharged to:: Private residence Living Arrangements: Spouse/significant other Available Help at Discharge: Family;Available 24 hours/day Type of Home: House Home Access: Stairs to enter [employer] of Steps: 4-5 Entrance Stairs-Rails: Right;Left;Can reach both Home Layout: One level     Bathroom Shower/Tub: Walk-in shower (level entry shower)   Bathroom Toilet: Handicapped height Bathroom Accessibility: Yes   Home Equipment: BSC/3in1;Shower seat - built in;Grab bars - toilet;Grab bars - tub/shower;Rollator (4 wheels);Rolling Walker (2 wheels);Hand held shower head;Adaptive equipment;Other (comment)   Additional Comments: provides supervision for husband who has dementia      Prior Functioning/Environment Prior Level of Function : Independent/Modified Independent             Mobility Comments: ambulatory without AD previously. Denies falls. Most recently utilizing RW for mobiltiy due to LE weakness ADLs Comments: indep    OT Problem List: Decreased strength;Decreased activity tolerance;Impaired balance (sitting and/or standing);Pain   OT Treatment/Interventions:  Self-care/ADL training;Therapeutic exercise;Therapeutic activities;Energy conservation;DME and/or AE instruction;Patient/family education;Balance training      OT Goals(Current goals can be found in the care plan section)   Acute Rehab OT Goals Patient Stated Goal: return home OT Goal Formulation: With patient/family Time For Goal Achievement: 12/17/23 Potential to Achieve Goals: Good ADL Goals Pt Will Perform Lower Body Bathing: with modified independence;sit to/from stand;sitting/lateral leans Additional ADL Goal #1: Pt will demo implementation of 1 learned ECS during ADL performance 2/2 trials to maximize safety and prevent overexertion.   OT Frequency:  Min 2X/week    Co-evaluation              AM-PAC OT 6 Clicks Daily Activity     Outcome Measure Help from another person eating meals?: None Help from another person taking care of personal grooming?: None Help from another person toileting, which includes using toliet, bedpan, or urinal?: A Little Help from another person bathing (including washing, rinsing, drying)?: A Little Help from another person to put on and taking off regular upper body clothing?: None Help from another person to put on and taking off regular lower body clothing?: A Little 6 Click Score: 21  End of Session Equipment Utilized During Treatment: Rolling walker (2 wheels) Nurse Communication: Mobility status  Activity Tolerance: Patient tolerated treatment well Patient left: in bed;with call bell/phone within reach;with family/visitor present  OT Visit Diagnosis: Other abnormalities of gait and mobility (R26.89);Muscle weakness (generalized) (M62.81)                Time: 1027-2536 OT Time Calculation (min): 17 min Charges:  OT General Charges $OT Visit: 1 Visit OT Evaluation $OT Eval Moderate Complexity: 1 Mod Mckenzie Lawrence, OTR/L 12/03/23, 10:17 AM  Mckenzie Lawrence Mckenzie Lawrence 12/03/2023, 10:15 AM

## 2023-12-03 NOTE — Progress Notes (Signed)
 \ PROGRESS NOTE   HPI was taken from Dr. Vallarie Gauze: Mckenzie Lawrence is a 78 y.o. female with medical history significant for COPD, CVA, stage 4 CKD, SIADH, esophageal dysphagia treated with Botox  (last in 05/2023), DM, HLD, and HTN, last hospitalized from 2/1 to 07/26/2023 with MRSA bacteremia/sepsis/multifocal pneumonia, now presenting by EMS with shortness of breath and chest pressure.  Daughter at bedside gives history and states that her mother was in her usual state of health until earlier in the day on 6/12 when she developed shortness of breath while getting an MRI on her back.  His symptoms seem to improve however on the morning of 6/13 she awoke at 2 AM gasping for breath and called EMS.  She states patient's blood pressure had been running high lately due to ongoing back pain and she had also complained of swelling in her legs.  She has no orthopnea and sleeps on 1 pillow.  She has a chronic congested cough that has not changed and has no fever or chills  BP on arrival 202/74 and tachypneic to 25 saturating at 98% on room air.  Afebrile and pulse in the 70s.  Labs notable for WBC 11,000 with hemoglobin of 9 which is her baseline. Troponin 19 and BNP 2450 Creatinine 3.59 up from baseline of 2.74 with bicarb 19.  Glucose 207, potassium 5.2, sodium 123 (137 four months ago) EKG with sinus rhythm at 79 Chest x-ray showing small right pleural effusion and vascular congestion Bilateral lower extremity venous ultrasound ordered-result pending. Patient treated with DuoNeb and Solu-Medrol  and given a dose of Lasix Hospitalist consulted for admission for COPD exacerbation and possible new onset CHF.      Mckenzie Lawrence  UJW:[id number] DOB: 12-20-1945 DOA: 11/28/2023 PCP: Lyle San, MD   Assessment & Plan:   Principal Problem:   Hypertensive urgency/emergency Active Problems:   COPD with acute exacerbation (HCC)   Acute dyspnea   CAD (coronary artery disease)   Chest pain   Acute renal  failure superimposed on stage 4 chronic kidney disease (HCC)   Hyponatremia   Hypertensive emergency   Encounter for dialysis catheter care [facility])  Assessment and Plan: Hypertensive urgency/emergency: resolved emergency still somewhat hypertensive Continue on metoprolol , losartan , amlodipine . IV hydralazine  prn   Acute diastolic CHF: w/ elevated BNP, CXR showing vascular congestion, pitting LE edema & dyspnea. Monitor I/Os. Continue on metoprolol , losartan . Fluid/volume management w/ HD. Echo shows EF 50-55%, grade II diastolic dysfunction, no regional wall motion abnormalities. Cardio following and recs apprec    COPD exacerbation: tessalon, bronchodilators & encourage incentive spirometry. Off steroids today, resolving  Chest pain: w/ hx of CAD. Troponins are minimally elevated. EKG shows no ischemic changes. Continue on losartan , metoprolol , statin, aspirin . No CP currently   AKI on CKDIV: Cr is labile. S/p tunneled HD cath placed. Started on HD 12/02/23. Will need outpatient HD spot prior to d/c.  Nephro following and recs apprec. Dialysis today  Anxiety: severity unknown. Increase home dose of valium  as per pt's request   Hyperkalemia: will managed w/ HD     Hyponatremia: w/ hx of SIADH. Labile. Will be managed w/ HD. Nephro following and recs apprec   ACD: likely secondary to CKD. H&H are stable  Acute diarrhea: x 3 days. Resolved as per pt    Esophageal dysphagia: s/p botox  via EGD in 08/2022. Aspiration precautions. Continue w/ supportive care   OSA: CPAP qhs  DM2: well controlled at baseline, HbA1c 6.1. Continue on glargine, SSI  w/ accuchecks. Will increase glargine  Insomnia: continue on home dose of ambien       DVT prophylaxis: lovenox  Code Status: full  Family Communication: discussed pt's care w/ pt's daughter at bedside and answered her questions  Disposition Plan: likely d/c back home w/ HH   Level of care: Progressive  Status is: Inpatient Remains inpatient  appropriate because: severity of illness, started HD today and will need outpatient HD spot prior to d/c     Consultants:  Nephro   Procedures:   Antimicrobials:   Subjective: Pt c/o malaise after dialysis  Objective: Vitals:   12/03/23 1500 12/03/23 1507 12/03/23 1509 12/03/23 1628  BP: (!) 147/47 (!) 144/52 (!) 144/52 (!) 157/52  Pulse: 64 66 72 72  Resp: 20 (!) 21 17   Temp:  98.8 F (37.1 C)  99.1 F (37.3 C)  TempSrc:  Oral    SpO2: 96% 95% 94% 92%  Weight:   76.3 kg   Height:        Intake/Output Summary (Last 24 hours) at 12/03/2023 1706 Last data filed at 12/03/2023 1509 Gross per 24 hour  Intake 240 ml  Output 470 ml  Net -230 ml    Filed Weights   12/03/23 0500 12/03/23 1227 12/03/23 1509  Weight: 78.7 kg 76.3 kg 76.3 kg    Examination:  General exam: appears comfortable   Respiratory system: diminished breath sounds b/l, faint exp wheeze Cardiovascular system: S1/S2+. No rubs or gallops  Gastrointestinal system: abd is soft, NT, ND & hypoactive bowel sounds  Central nervous system: alert & oriented. Moves all extremities  Psychiatry: judgement and insight appears at baseline. Flat mood and affect    Data Reviewed: I have personally reviewed following labs and imaging studies  CBC: Recent Labs  Lab 11/28/23 0246 11/29/23 0600 11/30/23 0235 12/01/23 0416 12/02/23 0430 12/03/23 0352  WBC 11.1* 14.6* 14.8* 14.5* 10.3 12.1*  NEUTROABS 9.7*  --   --   --   --   --   HGB 9.0* 7.9* 7.7* 7.4* 8.2* 7.8*  HCT 27.4* 23.6* 23.1* 22.6* 24.2* 23.6*  MCV 92.3 90.8 90.9 91.5 91.0 90.1  PLT 292 253 271 236 229 222   Basic Metabolic Panel: Recent Labs  Lab 11/28/23 0246 11/28/23 1452 11/29/23 0600 11/29/23 1451 11/29/23 1831 11/30/23 0235 11/30/23 0236 12/01/23 0416 12/02/23 0430 12/03/23 0352  NA 123*   < > 125*  --    < > 124* 125* 127* 127* 130*  K 5.2*   < > 5.6* 5.1  --  5.3*  --  5.3* 5.7* 4.7  CL 94*   < > 96*  --   --  95*  --  97*  96* 98  CO2 19*   < > 19*  --   --  20*  --  20* 20* 24  GLUCOSE 207*   < > 282*  --   --  312*  --  237* 331* 224*  BUN 67*   < > 73*  --   --  82*  --  82* 87* 61*  CREATININE 3.59*   < > 3.95*  --   --  4.09*  --  4.02* 4.22* 3.19*  CALCIUM  8.8*   < > 8.5*  --   --  8.2*  --  8.4* 8.3* 8.1*  MG 2.2  --   --   --   --   --   --   --   --   --    < > =  values in this interval not displayed.   GFR: Estimated Creatinine Clearance: 16 mL/min (A) (by C-G formula based on SCr of 3.19 mg/dL (H)). Liver Function Tests: Recent Labs  Lab 11/28/23 0246 11/28/23 1620  AST 17 22  ALT 11 11  ALKPHOS 95 88  BILITOT 0.7 0.7  PROT 6.7 6.2*  ALBUMIN 2.9* 2.6*   No results for input(s): LIPASE, AMYLASE in the last 168 hours. No results for input(s): AMMONIA in the last 168 hours. Coagulation Profile: No results for input(s): INR, PROTIME in the last 168 hours. Cardiac Enzymes: No results for input(s): CKTOTAL, CKMB, CKMBINDEX, TROPONINI in the last 168 hours. BNP (last 3 results) No results for input(s): PROBNP in the last 8760 hours. HbA1C: No results for input(s): HGBA1C in the last 72 hours. CBG: Recent Labs  Lab 12/02/23 1611 12/02/23 2015 12/02/23 2208 12/02/23 2347 12/03/23 0823  GLUCAP 299* 431* 420* 364* 211*   Lipid Profile: No results for input(s): CHOL, HDL, LDLCALC, TRIG, CHOLHDL, LDLDIRECT in the last 72 hours. Thyroid Function Tests: No results for input(s): TSH, T4TOTAL, FREET4, T3FREE, THYROIDAB in the last 72 hours. Anemia Panel: No results for input(s): VITAMINB12, FOLATE, FERRITIN, TIBC, IRON, RETICCTPCT in the last 72 hours. Sepsis Labs: No results for input(s): PROCALCITON, LATICACIDVEN in the last 168 hours.  No results found for this or any previous visit (from the past 240 hours).       Radiology Studies: No results found.       Scheduled Meds:  amLODipine   10 mg Oral Daily   aspirin   EC  81 mg Oral QHS   benzonatate  200 mg Oral TID   Chlorhexidine  Gluconate Cloth  6 each Topical Q0600   cloNIDine  0.1 mg Oral BID   diazepam   10 mg Oral Q1500   epoetin  alfa-epbx (RETACRIT ) injection  4,000 Units Intravenous Q M,W,F-1800   guaiFENesin   600 mg Oral BID   heparin  injection (subcutaneous)  5,000 Units Subcutaneous Q8H   insulin  aspart  0-5 Units Subcutaneous QHS   insulin  aspart  0-9 Units Subcutaneous TID WC   insulin  aspart  3 Units Subcutaneous TID WC   insulin  glargine-yfgn  12 Units Subcutaneous BID   losartan   100 mg Oral QHS   metoprolol  succinate  100 mg Oral QHS   nicotine   21 mg Transdermal Daily   rosuvastatin   5 mg Oral Daily   sodium bicarbonate   650 mg Oral BID   Continuous Infusions:  anticoagulant sodium citrate       LOS: 5 days       Raymonde Calico, MD [facility]  If 7PM-7AM, please contact night-coverage [email or web address] 12/03/2023, 5:06 PM

## 2023-12-03 NOTE — Plan of Care (Signed)
  Problem: Education: Goal: Knowledge of General Education information will improve Description: Including pain rating scale, medication(s)/side effects and non-pharmacologic comfort measures Outcome: Progressing   Problem: Health Behavior/Discharge Planning: Goal: Ability to manage health-related needs will improve Outcome: Progressing   Problem: Clinical Measurements: Goal: Will remain free from infection Outcome: Progressing   Problem: Clinical Measurements: Goal: Respiratory complications will improve Outcome: Progressing   Problem: Clinical Measurements: Goal: Cardiovascular complication will be avoided Outcome: Progressing   Problem: Activity: Goal: Risk for activity intolerance will decrease Outcome: Progressing   Problem: Nutrition: Goal: Adequate nutrition will be maintained Outcome: Progressing   Problem: Elimination: Goal: Will not experience complications related to urinary retention Outcome: Progressing   Problem: Pain Managment: Goal: General experience of comfort will improve and/or be controlled Outcome: Progressing   Problem: Safety: Goal: Ability to remain free from injury will improve Outcome: Progressing

## 2023-12-03 NOTE — Progress Notes (Signed)
 PT Cancellation Note  Patient Details Name: Mckenzie Lawrence MRN: [id number] DOB: [birth year]   Cancelled Treatment:    Reason Eval/Treat Not Completed: Other (comment) Attempted to see pt earlier this AM.  She reports being very tired/sleepy and hardly opened her eyes.  She did work with OT earlier this AM (showered) but states she is far to tired to consider working with PT today.  Pt to have HD this afternoon, will maintain on caseload and attempt to see at a later date when appropriate.   Darice Edelman, DPT 12/03/2023, 1:49 PM

## 2023-12-04 ENCOUNTER — Inpatient Hospital Stay

## 2023-12-04 DIAGNOSIS — I160 Hypertensive urgency: Secondary | ICD-10-CM | POA: Diagnosis not present

## 2023-12-04 LAB — BASIC METABOLIC PANEL WITH GFR
Anion gap: 8 (ref 5–15)
BUN: 38 mg/dL — ABNORMAL HIGH (ref 8–23)
CO2: 26 mmol/L (ref 22–32)
Calcium: 8 mg/dL — ABNORMAL LOW (ref 8.9–10.3)
Chloride: 97 mmol/L — ABNORMAL LOW (ref 98–111)
Creatinine, Ser: 2.59 mg/dL — ABNORMAL HIGH (ref 0.44–1.00)
GFR, Estimated: 19 mL/min — ABNORMAL LOW (ref >60)
Glucose, Bld: 116 mg/dL — ABNORMAL HIGH (ref 70–99)
Potassium: 4.2 mmol/L (ref 3.5–5.1)
Sodium: 131 mmol/L — ABNORMAL LOW (ref 135–145)

## 2023-12-04 LAB — URINALYSIS, COMPLETE (UACMP) WITH MICROSCOPIC
Bilirubin Urine: NEGATIVE
Glucose, UA: 150 mg/dL — AB
Hgb urine dipstick: NEGATIVE
Ketones, ur: NEGATIVE mg/dL
Leukocytes,Ua: NEGATIVE
Nitrite: NEGATIVE
Protein, ur: >=300 mg/dL — AB
Specific Gravity, Urine: 1.019 (ref 1.005–1.030)
pH: 6 (ref 5.0–8.0)

## 2023-12-04 LAB — CBC
HCT: 27.7 % — ABNORMAL LOW (ref 36.0–46.0)
Hemoglobin: 8.6 g/dL — ABNORMAL LOW (ref 12.0–15.0)
MCH: 29.2 pg (ref 26.0–34.0)
MCHC: 31 g/dL (ref 30.0–36.0)
MCV: 93.9 fL (ref 80.0–100.0)
Platelets: 228 10*3/uL (ref 150–400)
RBC: 2.95 MIL/uL — ABNORMAL LOW (ref 3.87–5.11)
RDW: 14.6 % (ref 11.5–15.5)
WBC: 12.1 10*3/uL — ABNORMAL HIGH (ref 4.0–10.5)
nRBC: 0 % (ref 0.0–0.2)

## 2023-12-04 LAB — GLUCOSE, CAPILLARY
Glucose-Capillary: 85 mg/dL (ref 70–99)
Glucose-Capillary: 67 mg/dL — ABNORMAL LOW (ref 70–99)
Glucose-Capillary: 110 mg/dL — ABNORMAL HIGH (ref 70–99)
Glucose-Capillary: 162 mg/dL — ABNORMAL HIGH (ref 70–99)

## 2023-12-04 LAB — SARS CORONAVIRUS 2 BY RT PCR: SARS Coronavirus 2 by RT PCR: NEGATIVE

## 2023-12-04 MED ORDER — SODIUM CHLORIDE 0.9 % IV SOLN
1.0000 g | Freq: Once | INTRAVENOUS | Status: AC
  Administered 2023-12-05: 1 g via INTRAVENOUS
  Filled 2023-12-04: qty 10

## 2023-12-04 MED ORDER — INSULIN ASPART 100 UNIT/ML IJ SOLN
2.0000 [IU] | Freq: Three times a day (TID) | INTRAMUSCULAR | Status: DC
  Administered 2023-12-05 (×2): 2 [IU] via SUBCUTANEOUS
  Filled 2023-12-04 (×4): qty 1

## 2023-12-04 MED ORDER — INSULIN GLARGINE-YFGN 100 UNIT/ML ~~LOC~~ SOLN
8.0000 [IU] | Freq: Every day | SUBCUTANEOUS | Status: DC
  Administered 2023-12-05 – 2023-12-06 (×2): 8 [IU] via SUBCUTANEOUS
  Filled 2023-12-04 (×3): qty 0.08

## 2023-12-04 MED ORDER — ZOLPIDEM TARTRATE 5 MG PO TABS
2.5000 mg | ORAL_TABLET | Freq: Every evening | ORAL | Status: DC | PRN
  Administered 2023-12-13 – 2023-12-14 (×2): 2.5 mg via ORAL
  Filled 2023-12-04 (×3): qty 1

## 2023-12-04 NOTE — Progress Notes (Signed)
 PT Cancellation Note  Patient Details Name: Mckenzie Lawrence MRN: [id number] DOB: 1945/10/24   Cancelled Treatment:     Pt febrile with increased RR, Will re-attempt next available date/time per POC.    Diona Franklin 12/04/2023, 6:27 PM

## 2023-12-04 NOTE — TOC Progression Note (Signed)
 Transition of Care [facility]) - Progression Note    Patient Details  Name: Mckenzie Lawrence MRN: [id number] Date of Birth: 1946/03/20  Transition of Care [facility]) CM/SW Contact  Baird Bombard, RN Phone Number: 12/04/2023, 1:00 PM  Clinical Narrative:    Message received from patient's nurse stated the family had chosen [facility] for [facility] and has already spoken with a representative.   Retrieved incoming call from [place] from [place]. Provided patient name and DOB to complete referral process. Janyce Mercy stated she would contact patient's daughter.          Expected Discharge Plan and Services                                               Social Determinants of Health (SDOH) Interventions SDOH Screenings   Food Insecurity: No Food Insecurity (11/28/2023)  Housing: Low Risk  (11/28/2023)  Transportation Needs: No Transportation Needs (11/28/2023)  Utilities: Not At Risk (11/28/2023)  Depression (PHQ2-9): Low Risk  (08/05/2023)  Financial Resource Strain: Low Risk  (10/31/2023)   Received from [facility]  Social Connections: Unknown (11/28/2023)  Tobacco Use: High Risk (11/28/2023)    Readmission Risk Interventions    07/22/2023   10:56 AM 08/13/2022    1:04 PM 11/10/2021    2:49 PM  Readmission Risk Prevention Plan  Transportation Screening Complete Complete Complete  PCP or Specialist Appt within 5-7 Days  Complete   PCP or Specialist Appt within 3-5 Days Complete  Complete  Home Care Screening  Complete   Medication Review (RN CM)  Complete   HRI or Home Care Consult Complete  Complete  Social Work Consult for Recovery Care Planning/Counseling Complete  Complete  Palliative Care Screening Not Applicable  Not Applicable  Medication Review Oceanographer) Complete  Complete

## 2023-12-04 NOTE — Procedures (Signed)
 HD Note:  Some information was entered later than the data was gathered due to patient care needs. The stated time with the data is accurate.  Received patient in bed to unit.   Alert and oriented.   Informed consent signed and in chart.   Access used: Upper right chest HD tunneled catheter Access issues: None  Patient tolerated treatment well.  Patient states that she is very tired.  TX duration: 3.5  Alert, without acute distress.  Total UF removed: 1000 ml  Hand-off given to patient's nurse.   Transported back to the room   Blayton Huttner L. Alva Jewels, RN Kidney Dialysis Unit.

## 2023-12-04 NOTE — Inpatient Diabetes Management (Signed)
 Inpatient Diabetes Program Recommendations  AACE/ADA: New Consensus Statement on Inpatient Glycemic Control (2015)  Target Ranges:  Prepandial:   less than 140 mg/dL      Peak postprandial:   less than 180 mg/dL (1-2 hours)      Critically ill patients:  140 - 180 mg/dL    Latest Reference Range & Units 12/03/23 08:23 12/03/23 17:33 12/03/23 21:07  Glucose-Capillary 70 - 99 mg/dL 161 (H)  3 units Novolog   12 units Semglee  102 (H) 148 (H)    12 units Semglee   (H): Data is abnormally high  Latest Reference Range & Units 12/04/23 07:37 12/04/23 12:31  Glucose-Capillary 70 - 99 mg/dL 85  Novolog  HELD 67 (L)  Novolog  HELD  Semglee  HELD at 10am  (L): Data is abnormally low    Home DM Meds: Semglee  10 units BID  Current Orders: Semglee  12 units BID     Novolog  Sensitive Correction Scale/ SSI (0-9 units) TID AC + HS     Novolog  3 units TID with meals     MD- Note Hypoglycemia at 12:30pm today. AM CBG down to 85 this AM.  May consider reducing the Semglee  to 8 units BID     --Will follow patient during hospitalization--  Langston Pippins RN, MSN, CDCES Diabetes Coordinator Inpatient Glycemic Control Team Team Pager: [phone] (8a-5p)

## 2023-12-04 NOTE — Plan of Care (Signed)
  Problem: Coping: Goal: Ability to adjust to condition or change in health will improve Outcome: Progressing   Problem: Fluid Volume: Goal: Ability to maintain a balanced intake and output will improve Outcome: Progressing   Problem: Metabolic: Goal: Ability to maintain appropriate glucose levels will improve Outcome: Progressing   Problem: Nutritional: Goal: Maintenance of adequate nutrition will improve Outcome: Progressing   Problem: Education: Goal: Knowledge of General Education information will improve Description: Including pain rating scale, medication(s)/side effects and non-pharmacologic comfort measures Outcome: Progressing   Problem: Clinical Measurements: Goal: Ability to maintain clinical measurements within normal limits will improve Outcome: Progressing

## 2023-12-04 NOTE — Progress Notes (Signed)
 [facility]  ROUNDING NOTE   Subjective:  Mckenzie Lawrence is a 78 y.o.  female with past medical history of hypertension, long-standing diabetes mellitus type 2, lower extremity edema, hyperlipidemia, tobacco abuse, prior history of CVA, COPD, obstructive sleep apnea, lumbar spinal fusion, left upper lobe lung mass who presents with volume overload and increasing shortness of breath.   Update: Patient was seen and evaluated earlier in the day during hemodialysis treatment. Patient found to be tolerating third dialysis treatment well. Outpatient placement still pending.   Objective:  Vital signs in last 24 hours:  Temp:  [97.9 F (36.6 C)-100 F (37.8 C)] 99.7 F (37.6 C) (06/19 1153) Pulse Rate:  [69-97] 91 (06/19 1153) Resp:  [18-23] 23 (06/19 1153) BP: (122-170)/(41-70) 159/50 (06/19 1153) SpO2:  [91 %-96 %] 94 % (06/19 1153) Weight:  [74.7 kg-82.1 kg] 74.7 kg (06/19 0753)  Weight change: 2.9 kg Filed Weights   12/03/23 1509 12/04/23 0420 12/04/23 0753  Weight: 76.3 kg 82.1 kg 74.7 kg    Intake/Output: I/O last 3 completed shifts: In: 360 [P.O.:240; Other:120] Out: 470 [Urine:470]   Intake/Output this shift:  Total I/O In: -  Out: 1000 [Other:1000]  Physical Exam: General: No acute distress  Head: Normocephalic, atraumatic. Dry mucosal membranes  Eyes: Anicteric  Neck: Supple  Lungs:  Normal breathing effort  Heart: Regular rate and rhythm  Abdomen:  Soft, nontender, BS present  Extremities: No peripheral edema.  Neurologic: Nonfocal, moving all four extremities  Skin: No lesions  Dialysis Access: Right IJ PermCath    Basic Metabolic Panel: Recent Labs  Lab 11/28/23 0246 11/28/23 1452 11/30/23 0235 11/30/23 0236 12/01/23 0416 12/02/23 0430 12/03/23 0352 12/04/23 0508  NA 123*   < > 124* 125* 127* 127* 130* 131*  K 5.2*   < > 5.3*  --  5.3* 5.7* 4.7 4.2  CL 94*   < > 95*  --  97* 96* 98 97*  CO2 19*   < > 20*  --  20* 20* 24 26   GLUCOSE 207*   < > 312*  --  237* 331* 224* 116*  BUN 67*   < > 82*  --  82* 87* 61* 38*  CREATININE 3.59*   < > 4.09*  --  4.02* 4.22* 3.19* 2.59*  CALCIUM  8.8*   < > 8.2*  --  8.4* 8.3* 8.1* 8.0*  MG 2.2  --   --   --   --   --   --   --    < > = values in this interval not displayed.    Liver Function Tests: Recent Labs  Lab 11/28/23 0246 11/28/23 1620  AST 17 22  ALT 11 11  ALKPHOS 95 88  BILITOT 0.7 0.7  PROT 6.7 6.2*  ALBUMIN 2.9* 2.6*   No results for input(s): LIPASE, AMYLASE in the last 168 hours. No results for input(s): AMMONIA in the last 168 hours.  CBC: Recent Labs  Lab 11/28/23 0246 11/29/23 0600 11/30/23 0235 12/01/23 0416 12/02/23 0430 12/03/23 0352 12/04/23 0508  WBC 11.1*   < > 14.8* 14.5* 10.3 12.1* 12.1*  NEUTROABS 9.7*  --   --   --   --   --   --   HGB 9.0*   < > 7.7* 7.4* 8.2* 7.8* 8.6*  HCT 27.4*   < > 23.1* 22.6* 24.2* 23.6* 27.7*  MCV 92.3   < > 90.9 91.5 91.0 90.1 93.9  PLT 292   < >  271 236 229 222 228   < > = values in this interval not displayed.    Cardiac Enzymes: No results for input(s): CKTOTAL, CKMB, CKMBINDEX, TROPONINI in the last 168 hours.  BNP: Invalid input(s): POCBNP  CBG: Recent Labs  Lab 12/03/23 1733 12/03/23 2107 12/04/23 0737 12/04/23 1231 12/04/23 1545  GLUCAP 102* 148* 85 67* 110*    Microbiology: Results for orders placed or performed during the hospital encounter of 08/28/23  Blood culture (routine single)     Status: None   Collection Time: 08/28/23 12:40 PM   Specimen: BLOOD  Result Value Ref Range Status   Specimen Description BLOOD RIGHT ANTECUBITAL  Final   Special Requests   Final    BOTTLES DRAWN AEROBIC AND ANAEROBIC Blood Culture adequate volume   Culture   Final    NO GROWTH 5 DAYS Performed at [facility], [address] [place], [place] [address]    Report Status 09/02/2023 FINAL  Final  Blood culture (routine single)     Status: None   Collection  Time: 08/28/23 12:40 PM   Specimen: BLOOD  Result Value Ref Range Status   Specimen Description BLOOD LEFT ANTECUBITAL  Final   Special Requests   Final    BOTTLES DRAWN AEROBIC AND ANAEROBIC Blood Culture adequate volume   Culture   Final    NO GROWTH 5 DAYS Performed at [facility], [address] [place], [place] [address]    Report Status 09/02/2023 FINAL  Final    Coagulation Studies: No results for input(s): LABPROT, INR in the last 72 hours.  Urinalysis: No results for input(s): COLORURINE, LABSPEC, PHURINE, GLUCOSEU, HGBUR, BILIRUBINUR, KETONESUR, PROTEINUR, UROBILINOGEN, NITRITE, LEUKOCYTESUR in the last 72 hours.  Invalid input(s): APPERANCEUR    Imaging: No results found.    Medications:    anticoagulant sodium citrate      amLODipine   10 mg Oral Daily   aspirin  EC  81 mg Oral QHS   benzonatate  200 mg Oral TID   Chlorhexidine  Gluconate Cloth  6 each Topical Q0600   cloNIDine  0.1 mg Oral BID   epoetin  alfa-epbx (RETACRIT ) injection  4,000 Units Intravenous Q M,W,F-1800   guaiFENesin   600 mg Oral BID   heparin  injection (subcutaneous)  5,000 Units Subcutaneous Q8H   insulin  aspart  0-5 Units Subcutaneous QHS   insulin  aspart  0-9 Units Subcutaneous TID WC   insulin  aspart  2 Units Subcutaneous TID WC   [START ON 12/05/2023] insulin  glargine-yfgn  8 Units Subcutaneous QHS   losartan   100 mg Oral QHS   metoprolol  succinate  100 mg Oral QHS   nicotine   21 mg Transdermal Daily   rosuvastatin   5 mg Oral Daily   acetaminophen  **OR** acetaminophen , albuterol , anticoagulant sodium citrate, guaiFENesin -dextromethorphan, heparin , hydrALAZINE , ondansetron  **OR** ondansetron  (ZOFRAN ) IV, oxyCODONE -acetaminophen , sucralfate , zolpidem   Assessment/ Plan:  Mckenzie Lawrence is a 78 y.o.  female  with past medical history of hypertension, long-standing diabetes mellitus type 2, lower extremity edema, hyperlipidemia, tobacco abuse,  prior history of CVA, COPD, obstructive sleep apnea, lumbar spinal fusion, left upper lobe lung mass who presents with volume overload and increasing shortness of breath.   1.  Acute kidney injury/chronic kidney disease stage IV/diabetes mellitus type 2 with chronic kidney disease/proteinuria.  eGFR down to 11.  Baseline creatinine 2.7 in February 2025.  Creatinine increased to 3.6-4 in June 2025. - PermCath has been placed  (12/01/2023) - Patient completed third dialysis treatment today. -Patient  requesting placement at [facility], awaiting chair availability.  Lab Results  Component Value Date   CREATININE 2.59 (H) 12/04/2023   CREATININE 3.19 (H) 12/03/2023   CREATININE 4.22 (H) 12/02/2023     Intake/Output Summary (Last 24 hours) at 12/04/2023 1712 Last data filed at 12/04/2023 1153 Gross per 24 hour  Intake 120 ml  Output 1000 ml  Net -880 ml       2.  Hyponatremia.  Differential includes underlying emphysema, lung nodules, AKI Sodium up to 131.  Continue to monitor.  3.  Hyperkalemia Potassium acceptable at 4.2.   4.  HTN - 2D echo from 11/29/2023 shows LVEF 55 to 60%, grade 2 diastolic dysfunction, trivial mitral regurgitation Continue amlodipine , losartan , metoprolol , and clonidine.   5.  Anemia of chronic kidney disease.  Hemoglobin 7.9 Patient has  left upper lobe mass concerning for malignancy.  Discussed with Dr. Rosebud Confer.  Okay to use ESA.  Consider transfusion for HgB<7     LOS: 6 Braylyn Kalter 6/19/20255:12 PM

## 2023-12-04 NOTE — Progress Notes (Signed)
 Patient declines wanting a stool softener, states she norma;;y has a bowel movement around every 5th day.

## 2023-12-04 NOTE — Progress Notes (Signed)
 \ PROGRESS NOTE   HPI was taken from Dr. Vallarie Gauze: Mckenzie Lawrence is a 78 y.o. female with medical history significant for COPD, CVA, stage 4 CKD, SIADH, esophageal dysphagia treated with Botox  (last in 05/2023), DM, HLD, and HTN, last hospitalized from 2/1 to 07/26/2023 with MRSA bacteremia/sepsis/multifocal pneumonia, now presenting by EMS with shortness of breath and chest pressure.  Daughter at bedside gives history and states that her mother was in her usual state of health until earlier in the day on 6/12 when she developed shortness of breath while getting an MRI on her back.  His symptoms seem to improve however on the morning of 6/13 she awoke at 2 AM gasping for breath and called EMS.  She states patient's blood pressure had been running high lately due to ongoing back pain and she had also complained of swelling in her legs.  She has no orthopnea and sleeps on 1 pillow.  She has a chronic congested cough that has not changed and has no fever or chills  BP on arrival 202/74 and tachypneic to 25 saturating at 98% on room air.  Afebrile and pulse in the 70s.  Labs notable for WBC 11,000 with hemoglobin of 9 which is her baseline. Troponin 19 and BNP 2450 Creatinine 3.59 up from baseline of 2.74 with bicarb 19.  Glucose 207, potassium 5.2, sodium 123 (137 four months ago) EKG with sinus rhythm at 79 Chest x-ray showing small right pleural effusion and vascular congestion Bilateral lower extremity venous ultrasound ordered-result pending. Patient treated with DuoNeb and Solu-Medrol  and given a dose of Lasix Hospitalist consulted for admission for COPD exacerbation and possible new onset CHF.      [name]  HQI:[id number] DOB: [birth year] DOA: 11/28/2023 PCP: Lyle San, MD   Assessment & Plan:   Principal Problem:   Hypertensive urgency/emergency Active Problems:   COPD with acute exacerbation (HCC)   Acute dyspnea   CAD (coronary artery disease)   Chest pain   Acute renal  failure superimposed on stage 4 chronic kidney disease (HCC)   Hyponatremia   Hypertensive emergency   Encounter for dialysis catheter care [facility])  Assessment and Plan: Hypertensive urgency/emergency: resolved emergency still somewhat hypertensive Continue on metoprolol , losartan , amlodipine . IV hydralazine  prn   Acute diastolic CHF: w/ elevated BNP, CXR showing vascular congestion, pitting LE edema & dyspnea. Monitor I/Os. Continue on metoprolol , losartan . Fluid/volume management w/ HD. Echo shows EF 50-55%, grade II diastolic dysfunction, no regional wall motion abnormalities. Cardio following and recs apprec    COPD exacerbation: tessalon, bronchodilators & encourage incentive spirometry. Off steroids 6/18, appears resolved  Chest pain: w/ hx of CAD. Troponins are minimally elevated. EKG shows no ischemic changes. Continue on losartan , metoprolol , statin, aspirin . No CP currently   ESRD progressing from CKDIV: C /p tunneled HD cath placed. Started on HD 12/02/23. Will need outpatient HD spot prior to d/c.  Nephro following and recs apprec. Dialysis today  Anxiety: severity unknown. Will d/c valium  given oversedation  Sedation: see above, will d/c valium , also decrease zolpidem  dose from 5 to 2.5  Hyperkalemia: will managed w/ HD    Low back pain: chronic, daughter says followed by [facility], says had mri there earlier this month that showed stable findings in the low back   Hyponatremia: w/ hx of SIADH. Labile. Will be managed w/ HD. Nephro following and recs apprec   ACD: likely secondary to CKD. H&H are stable  Acute diarrhea: x 3 days. Resolved as  per pt    Esophageal dysphagia: s/p botox  via EGD in 08/2022. Aspiration precautions. Continue w/ supportive care   OSA: CPAP qhs  DM2: well controlled at baseline, HbA1c 6.1. hypoglycemic today off steroids, will reduce insulin   Insomnia: decrease zolpidem  as above     DVT prophylaxis: heparin  Code Status: dnr   Family Communication: daughter at bedside 6/19 Disposition Plan: likely d/c back home w/ HH   Level of care: Progressive  Status is: Inpatient Remains inpatient appropriate because: severity of illness, started HD today and will need outpatient HD spot prior to d/c     Consultants:  Nephro   Procedures:   Antimicrobials:   Subjective: Feeling generally very weak  Objective: Vitals:   12/04/23 1100 12/04/23 1130 12/04/23 1143 12/04/23 1153  BP: (!) 157/49 (!) 147/50 (!) 163/70 (!) 159/50  Pulse: 84 84 88 91  Resp: 20 20 (!) 23 (!) 23  Temp:    99.7 F (37.6 C)  TempSrc:      SpO2: 95% 95% 96% 94%  Weight:      Height:        Intake/Output Summary (Last 24 hours) at 12/04/2023 1525 Last data filed at 12/04/2023 1153 Gross per 24 hour  Intake 120 ml  Output 1000 ml  Net -880 ml    Filed Weights   12/03/23 1509 12/04/23 0420 12/04/23 0753  Weight: 76.3 kg 82.1 kg 74.7 kg    Examination:  General exam: appears chronically ill   Respiratory system: diminished breath sounds b/l, no wheeze Cardiovascular system: S1/S2+. No rubs or gallops  Gastrointestinal system: abd is soft, NT  Central nervous system: alert & oriented. Sleepy, moving all 4, equal strength Psychiatry: judgement and insight appears at baseline. Flat mood and affect    Data Reviewed: I have personally reviewed following labs and imaging studies  CBC: Recent Labs  Lab 11/28/23 0246 11/29/23 0600 11/30/23 0235 12/01/23 0416 12/02/23 0430 12/03/23 0352 12/04/23 0508  WBC 11.1*   < > 14.8* 14.5* 10.3 12.1* 12.1*  NEUTROABS 9.7*  --   --   --   --   --   --   HGB 9.0*   < > 7.7* 7.4* 8.2* 7.8* 8.6*  HCT 27.4*   < > 23.1* 22.6* 24.2* 23.6* 27.7*  MCV 92.3   < > 90.9 91.5 91.0 90.1 93.9  PLT 292   < > 271 236 229 222 228   < > = values in this interval not displayed.   Basic Metabolic Panel: Recent Labs  Lab 11/28/23 0246 11/28/23 1452 11/30/23 0235 11/30/23 0236 12/01/23 0416  12/02/23 0430 12/03/23 0352 12/04/23 0508  NA 123*   < > 124* 125* 127* 127* 130* 131*  K 5.2*   < > 5.3*  --  5.3* 5.7* 4.7 4.2  CL 94*   < > 95*  --  97* 96* 98 97*  CO2 19*   < > 20*  --  20* 20* 24 26  GLUCOSE 207*   < > 312*  --  237* 331* 224* 116*  BUN 67*   < > 82*  --  82* 87* 61* 38*  CREATININE 3.59*   < > 4.09*  --  4.02* 4.22* 3.19* 2.59*  CALCIUM  8.8*   < > 8.2*  --  8.4* 8.3* 8.1* 8.0*  MG 2.2  --   --   --   --   --   --   --    < > =  values in this interval not displayed.   GFR: Estimated Creatinine Clearance: 19.5 mL/min (A) (by C-G formula based on SCr of 2.59 mg/dL (H)). Liver Function Tests: Recent Labs  Lab 11/28/23 0246 11/28/23 1620  AST 17 22  ALT 11 11  ALKPHOS 95 88  BILITOT 0.7 0.7  PROT 6.7 6.2*  ALBUMIN 2.9* 2.6*   No results for input(s): LIPASE, AMYLASE in the last 168 hours. No results for input(s): AMMONIA in the last 168 hours. Coagulation Profile: No results for input(s): INR, PROTIME in the last 168 hours. Cardiac Enzymes: No results for input(s): CKTOTAL, CKMB, CKMBINDEX, TROPONINI in the last 168 hours. BNP (last 3 results) No results for input(s): PROBNP in the last 8760 hours. HbA1C: No results for input(s): HGBA1C in the last 72 hours. CBG: Recent Labs  Lab 12/03/23 0823 12/03/23 1733 12/03/23 2107 12/04/23 0737 12/04/23 1231  GLUCAP 211* 102* 148* 85 67*   Lipid Profile: No results for input(s): CHOL, HDL, LDLCALC, TRIG, CHOLHDL, LDLDIRECT in the last 72 hours. Thyroid Function Tests: No results for input(s): TSH, T4TOTAL, FREET4, T3FREE, THYROIDAB in the last 72 hours. Anemia Panel: No results for input(s): VITAMINB12, FOLATE, FERRITIN, TIBC, IRON, RETICCTPCT in the last 72 hours. Sepsis Labs: No results for input(s): PROCALCITON, LATICACIDVEN in the last 168 hours.  No results found for this or any previous visit (from the past 240 hours).        Radiology Studies: No results found.       Scheduled Meds:  amLODipine   10 mg Oral Daily   aspirin  EC  81 mg Oral QHS   benzonatate  200 mg Oral TID   Chlorhexidine  Gluconate Cloth  6 each Topical Q0600   cloNIDine  0.1 mg Oral BID   diazepam   10 mg Oral Q1500   epoetin  alfa-epbx (RETACRIT ) injection  4,000 Units Intravenous Q M,W,F-1800   guaiFENesin   600 mg Oral BID   heparin  injection (subcutaneous)  5,000 Units Subcutaneous Q8H   insulin  aspart  0-5 Units Subcutaneous QHS   insulin  aspart  0-9 Units Subcutaneous TID WC   insulin  aspart  3 Units Subcutaneous TID WC   insulin  glargine-yfgn  12 Units Subcutaneous BID   losartan   100 mg Oral QHS   metoprolol  succinate  100 mg Oral QHS   nicotine   21 mg Transdermal Daily   rosuvastatin   5 mg Oral Daily   Continuous Infusions:  anticoagulant sodium citrate       LOS: 6 days       Raymonde Calico, MD [facility]  If 7PM-7AM, please contact night-coverage [email or web address] 12/04/2023, 3:25 PM

## 2023-12-04 NOTE — Progress Notes (Signed)
 [facility] CLINIC CARDIOLOGY PROGRESS NOTE       Patient ID: Mckenzie ARSCOTT MRN: [id number] DOB/AGE: [birth year] 78 y.o.  Admit date: 11/28/2023 Referring Physician Dr. Brion Cancel Primary Physician Lyle San, MD Primary Cardiologist Dr. Parks Bollman Reason for Consultation diastolic heart failure with SOB.  HPI: Mckenzie Lawrence is a 78 y.o. female  with a past medical history of coronary artery disease, hypertension, hyperlipidemia, COPD, OSA, hx CVA, CKD stage 4, SIADH, diabetes mellitus who presented to the ED on 11/28/2023 for worsening and persistent SOB and lower extremity swelling with concurrent COPD exacerbation and found elevated BNP of 2500. Cardiology was consulted for further evaluation.   Interval History: -Patient seen and examined this afternoon. Patient states she feels tired and has generalized body aches. Patient states her breathing feels okay and denies chest pain. -Patients BP elevated and HR stable. Overnight Tele showed no significant events.  -Patient remains on room air with stable SpO2.  -Patient underwent second dialysis session yesterday, removed 1L and tolerated well.    Review of systems complete and found to be negative unless listed above    Past Medical History:  Diagnosis Date   Anemia    Arthritis    Bladder incontinence    Broken foot    Cataracts, bilateral    Chronic kidney insufficiency    stage 3b   COPD (chronic obstructive pulmonary disease) (HCC)    wheezing   Coronary artery disease 04/25/2022   in [place]   COVID 2021   very mild case   CVA (cerebral vascular accident) (HCC) 2016   has had 3 strokes, states right side is slightlyweaker than left   Diabetes mellitus    insulin  dependent, Type 2   GERD (gastroesophageal reflux disease)    HLD (hyperlipidemia)    Hx of cardiovascular stress test    a. ETT (6/13):  Ex 5:13; no ischemic changes   Hypertension    controlled on meds   Lacunar stroke of left subthalamic region [facility])  02/2015   Leg pain    left   Lower back pain    Neuromuscular disorder (HCC)    stroke right hand tingling   Orthostatic hypotension    Osteopenia 01/2017   T score -2.0 stable from prior DEXA   Pancreatitis 10/2021   PCOS (polycystic ovarian syndrome)    Personal history of tobacco use, presenting hazards to health 01/09/2015   PONV (postoperative nausea and vomiting)    Sleep apnea    uses CPAP nightly    Past Surgical History:  Procedure Laterality Date   BIOPSY  08/28/2022   Procedure: BIOPSY;  Surgeon: Felecia Hopper, MD;  Location: [facility];  Service: Gastroenterology;;   BOTOX  INJECTION  01/15/2022   Procedure: BOTOX  INJECTION;  Surgeon: Ozell Blunt, MD;  Location: [facility];  Service: Gastroenterology;;   BOTOX  INJECTION N/A 08/28/2022   Procedure: BOTOX  INJECTION;  Surgeon: Felecia Hopper, MD;  Location: [facility];  Service: Gastroenterology;  Laterality: N/A;   BOTOX  INJECTION Bilateral 05/27/2023   Procedure: BOTOX  INJECTION;  Surgeon: Ozell Blunt, MD;  Location: [facility];  Service: Gastroenterology;  Laterality: Bilateral;   BROW LIFT Bilateral 11/04/2017   Procedure: BLEPHAROPLASTY UPPER EYELID W/EXCESS SKIN;  Surgeon: Zacarias Hermann, MD;  Location: [facility];  Service: Ophthalmology;  Laterality: Bilateral;  DIABETES-insulin  dependent uses CPAP   CARDIAC CATHETERIZATION  20 yrs ago   found nothing   CARPAL TUNNEL RELEASE Bilateral    CATARACT EXTRACTION Bilateral  DIALYSIS/PERMA CATHETER INSERTION N/A 12/01/2023   Procedure: DIALYSIS/PERMA CATHETER INSERTION;  Surgeon: Celso College, MD;  Location: [facility];  Service: Cardiovascular;  Laterality: N/A;   ELBOW SURGERY Bilateral    ESOPHAGOGASTRODUODENOSCOPY N/A 07/25/2021   Procedure: ESOPHAGOGASTRODUODENOSCOPY (EGD);  Surgeon: Toledo, Alphonsus Jeans, MD;  Location: [facility] END[facility];  Service: Gastroenterology;  Laterality: N/A;  IDDM   ESOPHAGOGASTRODUODENOSCOPY N/A 01/15/2022   Procedure:  ESOPHAGOGASTRODUODENOSCOPY (EGD);  Surgeon: Ozell Blunt, MD;  Location: [facility];  Service: Gastroenterology;  Laterality: N/A;  botox    ESOPHAGOGASTRODUODENOSCOPY (EGD) WITH PROPOFOL  N/A 08/28/2022   Procedure: ESOPHAGOGASTRODUODENOSCOPY (EGD) WITH PROPOFOL ;  Surgeon: Felecia Hopper, MD;  Location: [facility];  Service: Gastroenterology;  Laterality: N/A;   ESOPHAGOGASTRODUODENOSCOPY (EGD) WITH PROPOFOL  Bilateral 05/27/2023   Procedure: ESOPHAGOGASTRODUODENOSCOPY (EGD) WITH PROPOFOL ;  Surgeon: Ozell Blunt, MD;  Location: [facility];  Service: Gastroenterology;  Laterality: Bilateral;   FOOT SURGERY     Groin Abscess     HAND SURGERY     KNEE SURGERY Bilateral    LABIAL ABSCESS     LUMBAR LAMINECTOMY/DECOMPRESSION MICRODISCECTOMY Left 05/11/2018   Procedure: LUMBAR LAMINECTOMY/DECOMPRESSION MICRODISCECTOMY 1 LEVEL- L4-5;  Surgeon: Berta Brittle, MD;  Location: [facility];  Service: Neurosurgery;  Laterality: Left;   LUMBAR LAMINECTOMY/DECOMPRESSION MICRODISCECTOMY Left 05/20/2022   Procedure: MICRODISCECTOMY L3-4;  Surgeon: Garry Kansas, MD;  Location: [facility] OR;  Service: Neurosurgery;  Laterality: Left;  3C   LUMBAR WOUND DEBRIDEMENT N/A 08/08/2022   Procedure: INCISION AND DRAINAGE OF LUMBAR WOUND;  Surgeon: Garry Kansas, MD;  Location: [facility] OR;  Service: Neurosurgery;  Laterality: N/A;  3C   OOPHORECTOMY     BSO   PUBO VAG SLING     SHOULDER SURGERY     bilateral arthroscopies   TEE WITHOUT CARDIOVERSION N/A 07/25/2023   Procedure: TRANSESOPHAGEAL ECHOCARDIOGRAM (TEE);  Surgeon: Dorita Garter, MD;  Location: [facility] ORS;  Service: Cardiovascular;  Laterality: N/A;   VAGINAL HYSTERECTOMY  1979    Medications Prior to Admission  Medication Sig Dispense Refill Last Dose/Taking   acetaminophen  (TYLENOL ) 500 MG tablet Take 1,000 mg by mouth every 6 (six) hours as needed for moderate pain.   Unknown   albuterol  (VENTOLIN  HFA) 108 (90 Base) MCG/ACT inhaler Inhale 2 puffs into the lungs  every 6 (six) hours as needed for wheezing or shortness of breath.   Unknown   aspirin  EC 81 MG tablet Take 81 mg by mouth at bedtime. Swallow whole.   Taking   calcitRIOL  (ROCALTROL ) 0.25 MCG capsule Take 0.25 mcg by mouth at bedtime.   Taking   cholecalciferol (VITAMIN D3) 25 MCG (1000 UNIT) tablet Take 1,000 Units by mouth daily.   Taking   cyclobenzaprine  (FLEXERIL ) 10 MG tablet Take 1 tablet (10 mg total) by mouth 3 (three) times daily as needed for muscle spasms. 30 tablet 0 Taking As Needed   D-MANNOSE PO Take 2 tablets by mouth at bedtime.   Taking   diazepam  (VALIUM ) 5 MG tablet Take 5 mg by mouth 2 (two) times daily as needed.   Taking As Needed   diphenhydrAMINE (BENADRYL) 25 MG tablet Take 50 mg by mouth at bedtime as needed for sleep.   Taking As Needed   docusate sodium  (COLACE) 100 MG capsule Take 1 capsule (100 mg total) by mouth 2 (two) times daily. (Patient taking differently: Take 100 mg by mouth at bedtime as needed for mild constipation or moderate constipation.) 30 capsule 0 Taking Differently   estradiol  (ESTRACE ) 0.1 MG/GM vaginal cream Place  1 Applicatorful vaginally 3 (three) times a week.   Taking   Fe Fum-Vit C-Vit B12-FA (TRIGELS-F FORTE) CAPS capsule Take 1 capsule by mouth daily after breakfast. 90 capsule 0 Taking   insulin  glargine-yfgn (SEMGLEE ) 100 UNIT/ML Pen Inject 10 Units into the skin 2 (two) times daily.   Taking   losartan  (COZAAR ) 100 MG tablet Take 1 tablet (100 mg total) by mouth daily. (Patient taking differently: Take 100 mg by mouth at bedtime.) 30 tablet 2 Taking Differently   metoprolol  succinate (TOPROL -XL) 100 MG 24 hr tablet Take 100 mg by mouth at bedtime.   Taking   Multiple Vitamins-Minerals (PRESERVISION AREDS 2+MULTI VIT PO) Take 1 capsule by mouth daily.   Taking   nitrofurantoin  (MACRODANTIN ) 100 MG capsule Take 100 mg by mouth at bedtime.   Taking   rosuvastatin  (CRESTOR ) 5 MG tablet Take 5 mg by mouth daily.   Taking   sennosides-docusate  sodium (SENOKOT-S) 8.6-50 MG tablet Take 1 tablet by mouth daily as needed for constipation.   Taking As Needed   sodium bicarbonate  650 MG tablet Take 1 tablet (650 mg total) by mouth 2 (two) times daily. (Patient taking differently: Take 650 mg by mouth daily.) 100 tablet 1 Taking Differently   sucralfate  (CARAFATE ) 1 g tablet Take 1 tablet (1 g total) by mouth 2 (two) times daily as needed (acid reflux).   Taking As Needed   Vibegron (GEMTESA) 75 MG TABS Take 75 mg by mouth at bedtime.   Taking   zolpidem  (AMBIEN ) 10 MG tablet Take 10 mg by mouth at bedtime as needed for sleep.   Taking As Needed   amLODipine  (NORVASC ) 10 MG tablet Take 1 tablet (10 mg total) by mouth daily. (Patient not taking: Reported on 11/28/2023) 30 tablet 2 Not Taking   BD INSULIN  SYRINGE U/F 31G X 5/16 1 ML MISC USE 1 SYRINGE AS DIRECTED      Continuous Glucose Sensor (FREESTYLE LIBRE 2 SENSOR) MISC       epoetin  alfa-epbx (RETACRIT ) 4000 UNIT/ML injection Inject 1 mL (4,000 Units total) into the skin once a week. (Patient not taking: Reported on 11/28/2023) 4 mL 3 Not Taking   feeding supplement (BOOST HIGH PROTEIN) LIQD Take 1 Container by mouth 2 (two) times daily between meals.      insulin  detemir (LEVEMIR ) 100 UNIT/ML injection Inject 100-120 Units into the skin daily. (Patient not taking: Reported on 11/28/2023)   Not Taking   nystatin  (MYCOSTATIN ) 100000 UNIT/ML suspension Take 5 mLs (500,000 Units total) by mouth 4 (four) times daily. (Patient not taking: Reported on 11/28/2023) 120 mL 1 Not Taking   ONETOUCH ULTRA test strip 4 (four) times daily.      Social History   Socioeconomic History   Marital status: Married    Spouse name: Not on file   Number of children: 1   Years of education: Not on file   Highest education level: Not on file  Occupational History   Not on file  Tobacco Use   Smoking status: Every Day    Current packs/day: 1.50    Average packs/day: 1.5 packs/day for 50.0 years (75.0 ttl  pk-yrs)    Types: Cigarettes   Smokeless tobacco: Never   Tobacco comments:    3ppd x 10 year, then cut back to 1.5pdd since 02/2015  Vaping Use   Vaping status: Never Used  Substance and Sexual Activity   Alcohol use: Never    Alcohol/week: 0.0 standard drinks of alcohol  Drug use: No   Sexual activity: Not Currently    Birth control/protection: Surgical    Comment: Hx Hysterectomy, 1st intercourse 78 yo-Fewer than 5 partners  Other Topics Concern   Not on file  Social History Narrative   Lives at home with husband in a one story home.  Has 1 daughter.     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     Education: some college.   Social Drivers of Corporate investment banker Strain: Low Risk  (10/31/2023)   Received from [facility]   Overall Financial Resource Strain (CARDIA)    Difficulty of Paying Living Expenses: Not hard at all  Food Insecurity: No Food Insecurity (11/28/2023)   Hunger Vital Sign    Worried About Running Out of Food in the Last Year: Never true    Ran Out of Food in the Last Year: Never true  Transportation Needs: No Transportation Needs (11/28/2023)   PRAPARE - Administrator, Civil Service (Medical): No    Lack of Transportation (Non-Medical): No  Physical Activity: Not on file  Stress: Not on file  Social Connections: Unknown (11/28/2023)   Social Connection and Isolation Panel    Frequency of Communication with Friends and Family: More than three times a week    Frequency of Social Gatherings with Friends and Family: More than three times a week    Attends Religious Services: More than 4 times per year    Active Member of [employer] or Organizations: Not on file    Attends Banker Meetings: Not on file    Marital Status: Not on file  Intimate Partner Violence: Not At Risk (11/28/2023)   Humiliation, Afraid, Rape, and Kick questionnaire    Fear of Current or Ex-Partner: No    Emotionally Abused: No     Physically Abused: No    Sexually Abused: No    Family History  Problem Relation Age of Onset   Hypertension Mother    Heart disease Mother 40       MI   Diabetes Father    Diabetes Sister    Hypertension Sister    Diabetes Brother    Hypertension Brother    Heart disease Brother 41       CAD   Cancer Sister        Multiple myloma   Diabetes Brother      Vitals:   12/04/23 1100 12/04/23 1130 12/04/23 1143 12/04/23 1153  BP: (!) 157/49 (!) 147/50 (!) 163/70 (!) 159/50  Pulse: 84 84 88 91  Resp: 20 20 (!) 23 (!) 23  Temp:    99.7 F (37.6 C)  TempSrc:      SpO2: 95% 95% 96% 94%  Weight:      Height:        PHYSICAL EXAM General: well appearing elderly female, well nourished, in no acute distress. HEENT: Normocephalic and atraumatic. Neck: No JVD.   Lungs: Normal respiratory effort on room air. Diminished breath sounds bilaterally, bibasilar crackles.  Heart: HRRR. Normal S1 and S2, + murmur  Abdomen: Non-distended appearing.  Msk: Normal strength and tone for age. Extremities: Warm and well perfused. No clubbing, cyanosis. 1+ pitting edema bilaterally.  Neuro: Alert and oriented X 3. Psych: Answers questions appropriately.   Labs: Basic Metabolic Panel: Recent Labs    12/03/23 0352 12/04/23 0508  NA 130* 131*  K 4.7 4.2  CL 98 97*  CO2 24 26  GLUCOSE 224* 116*  BUN 61* 38*  CREATININE 3.19* 2.59*  CALCIUM  8.1* 8.0*   Liver Function Tests: No results for input(s): AST, ALT, ALKPHOS, BILITOT, PROT, ALBUMIN in the last 72 hours.  No results for input(s): LIPASE, AMYLASE in the last 72 hours. CBC: Recent Labs    12/03/23 0352 12/04/23 0508  WBC 12.1* 12.1*  HGB 7.8* 8.6*  HCT 23.6* 27.7*  MCV 90.1 93.9  PLT 222 228   Cardiac Enzymes: No results for input(s): CKTOTAL, CKMB, CKMBINDEX, TROPONINIHS in the last 72 hours. BNP: No results for input(s): BNP in the last 72 hours. D-Dimer: No results for input(s): DDIMER in  the last 72 hours. Hemoglobin A1C: No results for input(s): HGBA1C in the last 72 hours. Fasting Lipid Panel: No results for input(s): CHOL, HDL, LDLCALC, TRIG, CHOLHDL, LDLDIRECT in the last 72 hours. Thyroid Function Tests: No results for input(s): TSH, T4TOTAL, T3FREE, THYROIDAB in the last 72 hours.  Invalid input(s): FREET3 Anemia Panel: No results for input(s): VITAMINB12, FOLATE, FERRITIN, TIBC, IRON, RETICCTPCT in the last 72 hours.   Radiology: PERIPHERAL VASCULAR CATHETERIZATION Result Date: 12/01/2023 See surgical note for result.  ECHOCARDIOGRAM COMPLETE Result Date: 11/29/2023    ECHOCARDIOGRAM REPORT   Patient Name:   Mckenzie Lawrence Date of Exam: 11/29/2023 Medical Rec #:  [id number]      Height:       67.0 in Accession #:    [id number]     Weight:       176.6 lb Date of Birth:  January 21, 1946      BSA:          1.918 m Patient Age:    78 years       BP:           163/51 mmHg Patient Gender: F              HR:           81 bpm. Exam Location:  [facility] Procedure: 2D Echo, Cardiac Doppler and Color Doppler (Both Spectral and Color            Flow Doppler were utilized during procedure). Indications:     CHF I50.31  History:         Patient has prior history of Echocardiogram examinations, most                  recent 07/23/2023.  Sonographer:     Clenton Czech RDCS, FASE Referring Phys:  [id number] Alphonsus Jeans Diagnosing Phys: Dwayne D Callwood MD IMPRESSIONS  1. Left ventricular ejection fraction, by estimation, is 55 to 60%. The left ventricle has normal function. The left ventricle has no regional wall motion abnormalities. The left ventricular internal cavity size was moderately dilated. Left ventricular diastolic parameters are consistent with Grade II diastolic dysfunction (pseudonormalization).  2. Right ventricular systolic function is normal. The right ventricular size is normal.  3. Left atrial size was mildly dilated.  4. The mitral valve is  normal in structure. Trivial mitral valve regurgitation.  5. The aortic valve is normal in structure. Aortic valve regurgitation is not visualized. FINDINGS  Left Ventricle: Left ventricular ejection fraction, by estimation, is 55 to 60%. The left ventricle has normal function. The left ventricle has no regional wall motion abnormalities. Strain was performed and the global longitudinal strain is indeterminate. The left ventricular internal cavity size was moderately dilated. There is no left ventricular hypertrophy. Left ventricular diastolic parameters are consistent with Grade II  diastolic dysfunction (pseudonormalization). Right Ventricle: The right ventricular size is normal. No increase in right ventricular wall thickness. Right ventricular systolic function is normal. Left Atrium: Left atrial size was mildly dilated. Right Atrium: Right atrial size was normal in size. Pericardium: There is no evidence of pericardial effusion. Mitral Valve: The mitral valve is normal in structure. Trivial mitral valve regurgitation. Tricuspid Valve: The tricuspid valve is normal in structure. Tricuspid valve regurgitation is mild. Aortic Valve: The aortic valve is normal in structure. Aortic valve regurgitation is not visualized. Aortic valve peak gradient measures 8.8 mmHg. Pulmonic Valve: The pulmonic valve was normal in structure. Pulmonic valve regurgitation is not visualized. Aorta: The ascending aorta was not well visualized. IAS/Shunts: No atrial level shunt detected by color flow Doppler. Additional Comments: 3D was performed not requiring image post processing on an independent workstation and was indeterminate.  LEFT VENTRICLE PLAX 2D LVIDd:         5.50 cm     Diastology LVIDs:         3.80 cm     LV e' medial:    7.18 cm/s LV PW:         1.00 cm     LV E/e' medial:  19.9 LV IVS:        1.10 cm     LV e' lateral:   8.38 cm/s LVOT diam:     1.80 cm     LV E/e' lateral: 17.1 LV SV:         63 LV SV Index:   33 LVOT  Area:     2.54 cm  LV Volumes (MOD) LV vol d, MOD A2C: 74.1 ml LV vol d, MOD A4C: 70.9 ml LV vol s, MOD A2C: 37.0 ml LV vol s, MOD A4C: 28.8 ml LV SV MOD A2C:     37.1 ml LV SV MOD A4C:     70.9 ml LV SV MOD BP:      41.0 ml RIGHT VENTRICLE RV Basal diam:  3.20 cm RV S prime:     14.80 cm/s TAPSE (M-mode): 2.6 cm LEFT ATRIUM             Index        RIGHT ATRIUM           Index LA diam:        4.30 cm 2.24 cm/m   RA Area:     19.70 cm LA Vol (A2C):   44.9 ml 23.41 ml/m  RA Volume:   55.40 ml  28.88 ml/m LA Vol (A4C):   53.8 ml 28.05 ml/m LA Biplane Vol: 51.9 ml 27.06 ml/m  AORTIC VALVE                 PULMONIC VALVE AV Area (Vmax): 1.84 cm     PV Vmax:        1.01 m/s AV Vmax:        148.00 cm/s  PV Peak grad:   4.1 mmHg AV Peak Grad:   8.8 mmHg     RVOT Peak grad: 3 mmHg LVOT Vmax:      107.00 cm/s LVOT Vmean:     76.000 cm/s LVOT VTI:       0.249 m  AORTA Ao Root diam: 2.90 cm MITRAL VALVE MV Area (PHT): 4.86 cm     SHUNTS MV Decel Time: 156 msec     Systemic VTI:  0.25 m MV E velocity: 143.00 cm/s  Systemic Diam: 1.80 cm  MV A velocity: 80.30 cm/s MV E/A ratio:  1.78 Mckenzie Batters MD Electronically signed by Mckenzie Batters MD Signature Date/Time: 11/29/2023/10:33:04 AM    Final    US  Venous Img Lower Bilateral Result Date: 11/28/2023 CLINICAL DATA:  New onset left greater than right lower extremity swelling, without findings of CHF. EXAM: BILATERAL LOWER EXTREMITY VENOUS DOPPLER ULTRASOUND TECHNIQUE: Gray-scale sonography with graded compression, as well as color Doppler and duplex ultrasound were performed to evaluate the lower extremity deep venous systems from the level of the common femoral vein and including the common femoral, femoral, profunda femoral, popliteal and calf veins including the posterior tibial, peroneal and gastrocnemius veins when visible. The superficial great saphenous vein was also interrogated. Spectral Doppler was utilized to evaluate flow at rest and with distal  augmentation maneuvers in the common femoral, femoral and popliteal veins. COMPARISON:  Left lower extremity DVT exam 08/26/2019. FINDINGS: RIGHT LOWER EXTREMITY Common Femoral Vein: No evidence of thrombus. Normal compressibility, respiratory phasicity and response to augmentation. Saphenofemoral Junction: No evidence of thrombus. Normal compressibility and flow on color Doppler imaging. Profunda Femoral Vein: No evidence of thrombus. Normal compressibility and flow on color Doppler imaging. Femoral Vein: No evidence of thrombus. Normal compressibility, respiratory phasicity and response to augmentation. Popliteal Vein: No evidence of thrombus. Normal compressibility, respiratory phasicity and response to augmentation. Calf Veins: No evidence of thrombus. Normal compressibility and flow on color Doppler imaging. Superficial Great Saphenous Vein: No evidence of thrombus. Normal compressibility. Venous Reflux:  None. Other Findings:  Pulsatile venous waveforms. LEFT LOWER EXTREMITY Common Femoral Vein: No evidence of thrombus. Normal compressibility, respiratory phasicity and response to augmentation. Saphenofemoral Junction: No evidence of thrombus. Normal compressibility and flow on color Doppler imaging. Profunda Femoral Vein: No evidence of thrombus. Normal compressibility and flow on color Doppler imaging. Femoral Vein: No evidence of thrombus. Normal compressibility, respiratory phasicity and response to augmentation. Popliteal Vein: No evidence of thrombus. Normal compressibility, respiratory phasicity and response to augmentation. Calf Veins: No evidence of thrombus. Normal compressibility and flow on color Doppler imaging. Superficial Great Saphenous Vein: No evidence of thrombus. Normal compressibility. Venous Reflux:  None. Other Findings:  Pulsatile venous waveforms. IMPRESSION: 1. No evidence of deep venous thrombosis in either lower extremity. 2. Pulsatile venous waveforms, which can be seen with right  heart failure or tricuspid regurgitation. Electronically Signed   By: Denman Fischer M.D.   On: 11/28/2023 04:56   DG Chest Portable 1 View Result Date: 11/28/2023 CLINICAL DATA:  Difficulty breathing EXAM: PORTABLE CHEST 1 VIEW COMPARISON:  07/19/2023 FINDINGS: Cardiac shadow is mildly enlarged. Aortic calcifications are seen. Mild vascular congestion is noted. Small right-sided pleural effusion is noted. Metallic densities are noted symmetrically over the upper chest likely artifactual in nature. IMPRESSION: Small right pleural effusion and vascular congestion. Electronically Signed   By: Violeta Grey M.D.   On: 11/28/2023 02:59    ECHO as above  TELEMETRY reviewed by me 12/04/2023: sinus rhythm with PACs, rate 70s  EKG reviewed by me: Normal sinus rhythm, mild right axis nonspecific ST-T wave changes   Data reviewed by me 12/04/2023: last 24h vitals tele labs imaging I/O hospitalist progress note, nephrology notes.  Principal Problem:   Hypertensive urgency/emergency Active Problems:   Hyponatremia   CAD (coronary artery disease)   COPD with acute exacerbation (HCC)   Acute renal failure superimposed on stage 4 chronic kidney disease (HCC)   Chest pain   Hypertensive emergency   Acute dyspnea  Encounter for dialysis catheter care [facility])    ASSESSMENT AND PLAN:  KATERA RYBKA is a 78 y.o. female  with a past medical history of coronary artery disease, hypertension, hyperlipidemia, COPD, OSA, hx CVA, CKD stage 4, SIADH, diabetes mellitus who presented to the ED on 11/28/2023 for worsening and persistent SOB and lower extremity swelling with concurrent COPD exacerbation and found elevated BNP of 2500. Cardiology was consulted for further evaluation.   # ESRD (Plan to start dialysis on 06/16) Permacath placement on 06/17 by Dr. Vonna Guardian. Patient underwent second dialysis session on 06/18, removed 1L and tolerated well.  -Nephrology following, appreciate recommendations.   # Acute HFpEF  exacerbation # COPD exacerbation  # OSA Patient presents with worsening SOB and LE swelling. BNP elevated at 2450. Echo this admission with pEF, no RWMA, grade II diastolic dysfunction.  -Continue metoprolol  succinate 100 mg daily.  -Continue losartan  as stated below.  -Fluid removal via dialysis.  -GDMT optimization limited due to ESRD.  -Recommend sleep study for OSA if CPAP indicated.  -Recommend breathing treatments and frequent use of incentive spirometry for COPD. Management per primary. -Recommend pulmonology consultation.   # Coronary artery disease # Hypertension # Hyperlipidemia # Demand ischemia Patient without chest pain. Trops minimally elevated and flat 19 > 20. EKG without acute ischemic changes. Echo with no RWMA.   -Continue ASA 81 mg, rosuvastatin  5 mg daily -Continue amlodipine  10 mg daily.  -Continue losartan  100 mg daily. -Continue clonidine 0.1 mg BID.  -Continue metoprolol  as stated above. -Minimally elevated and flat trop sin setting of ESRD, heart failure exacerbation is most consistent with demand/supply mismatch and not ACS.  This patient's plan of care was discussed and created with Dr. Beau Bound and he is in agreement.  Signed: Creighton Doffing, PA-C  12/04/2023, 2:09 PM [facility]

## 2023-12-05 DIAGNOSIS — I160 Hypertensive urgency: Secondary | ICD-10-CM | POA: Diagnosis not present

## 2023-12-05 LAB — RESPIRATORY PANEL BY PCR

## 2023-12-05 LAB — GLUCOSE, CAPILLARY
Glucose-Capillary: 147 mg/dL — ABNORMAL HIGH (ref 70–99)
Glucose-Capillary: 188 mg/dL — ABNORMAL HIGH (ref 70–99)
Glucose-Capillary: 264 mg/dL — ABNORMAL HIGH (ref 70–99)
Glucose-Capillary: 191 mg/dL — ABNORMAL HIGH (ref 70–99)

## 2023-12-05 MED ORDER — BOOST PLUS PO LIQD
237.0000 mL | Freq: Three times a day (TID) | ORAL | Status: DC
  Administered 2023-12-07 – 2023-12-15 (×5): 237 mL via ORAL

## 2023-12-05 MED ORDER — RENA-VITE PO TABS
1.0000 | ORAL_TABLET | Freq: Every day | ORAL | Status: DC
  Administered 2023-12-05 – 2023-12-15 (×9): 1 via ORAL
  Filled 2023-12-05 (×10): qty 1

## 2023-12-05 NOTE — Progress Notes (Signed)
 Start date for outpatient Dialysis [facility] [phone] [address]  Days Tues/Thurs/Sat  Chair time 12:10

## 2023-12-05 NOTE — Progress Notes (Addendum)
 \ PROGRESS NOTE   HPI was taken from Dr. Vallarie Gauze: Mckenzie Lawrence is a 78 y.o. female with medical history significant for COPD, CVA, stage 4 CKD, SIADH, esophageal dysphagia treated with Botox  (last in 05/2023), DM, HLD, and HTN, last hospitalized from 2/1 to 07/26/2023 with MRSA bacteremia/sepsis/multifocal pneumonia, now presenting by EMS with shortness of breath and chest pressure.  Daughter at bedside gives history and states that her mother was in her usual state of health until earlier in the day on 6/12 when she developed shortness of breath while getting an MRI on her back.  His symptoms seem to improve however on the morning of 6/13 she awoke at 2 AM gasping for breath and called EMS.  She states patient's blood pressure had been running high lately due to ongoing back pain and she had also complained of swelling in her legs.  She has no orthopnea and sleeps on 1 pillow.  She has a chronic congested cough that has not changed and has no fever or chills  BP on arrival 202/74 and tachypneic to 25 saturating at 98% on room air.  Afebrile and pulse in the 70s.  Labs notable for WBC 11,000 with hemoglobin of 9 which is her baseline. Troponin 19 and BNP 2450 Creatinine 3.59 up from baseline of 2.74 with bicarb 19.  Glucose 207, potassium 5.2, sodium 123 (137 four months ago) EKG with sinus rhythm at 79 Chest x-ray showing small right pleural effusion and vascular congestion Bilateral lower extremity venous ultrasound ordered-result pending. Patient treated with DuoNeb and Solu-Medrol  and given a dose of Lasix Hospitalist consulted for admission for COPD exacerbation and possible new onset CHF.      Mckenzie Lawrence  JWJ:[id number] DOB: December 26, 1945 DOA: 11/28/2023 PCP: Lyle San, MD   Assessment & Plan:   Principal Problem:   Hypertensive urgency/emergency Active Problems:   COPD with acute exacerbation (HCC)   Acute dyspnea   CAD (coronary artery disease)   Chest pain   Acute renal  failure superimposed on stage 4 chronic kidney disease (HCC)   Hyponatremia   Hypertensive emergency   Encounter for dialysis catheter care [facility])  Assessment and Plan:  ESRD progressing from CKD IV:  C /p tunneled HD cath placed. Started on HD 12/02/23.  Patient will need outpatient HD spot prior to d/c.  Nephro following and recs apprec.  Last Hemodialysis yesterday  Hypertensive urgency/emergency: resolved  BP moderately controlled Continue on metoprolol , losartan , amlodipine .  Continue IV hydralazine  prn   Acute diastolic CHF: resolved Echo shows EF 50-55%, grade II diastolic dysfunction, no regional wall motion abnormalities.  elevated BNP, CXR showing vascular congestion, pitting LE edema & dyspnea at admission.  Monitor I/Os.  Continue on metoprolol , losartan  (GDMT).  Fluid/volume management w/ HD. Cardio following and recs appreciated   COPD exacerbation: stable  Continue tessalon, bronchodilators & encourage incentive spirometry. Off steroids since 6/18  Chest pain: denies any chest pain at this encounter w/ hx of CAD.  Troponins are minimally elevated.  EKG shows no ischemic changes.  Continue on losartan , metoprolol , statin, aspirin . No CP currently   Anxiety: severity unknown. d/ced valium  given oversedation  Sedation: see above, d/ced valium , Decreased zolpidem  dose from 5 to 2.5  Hyperkalemia: will managed w/ HD    Low back pain: chronic, daughter says followed by [facility], says had mri there earlier this month that showed stable findings in the low back   Hyponatremia: w/ hx of SIADH. Labile. Will be managed w/  HD. Nephro following and recs apprec   ACD: likely secondary to CKD. H&H are stable  Acute diarrhea: x 3 days. Resolved as per pt    Esophageal dysphagia: s/p botox  via EGD in 08/2022. Aspiration precautions. Continue w/ supportive care   OSA: CPAP qhs  DM2: well controlled at baseline, HbA1c 6.1.  hypoglycemia -- reduced insulin ,  resolved  Insomnia: decreased zolpidem  dose     DVT prophylaxis: heparin  Code Status: dnr  Family Communication: daughter at bedside 6/19 Disposition Plan: likely d/c back home w/ HH   Level of care: Progressive  Status is: Inpatient Remains inpatient appropriate because: severity of illness, started HD today and will need outpatient HD spot prior to d/c   Consultants:  Nephro   Procedures:   Antimicrobials:   Subjective: Has generalized weakness Denies any chest pain, SOB, cough, fever, chills, abdominal symptoms  Objective: Vitals:   12/05/23 0500 12/05/23 0538 12/05/23 0600 12/05/23 0739  BP:  (!) 144/46 (!) 125/58 (!) 156/43  Pulse:  80 75 82  Resp:  19 18 18   Temp:  98.4 F (36.9 C) 98.1 F (36.7 C) 98.7 F (37.1 C)  TempSrc:  Oral Oral Oral  SpO2:  93% 94% 94%  Weight: 75.1 kg     Height:        Intake/Output Summary (Last 24 hours) at 12/05/2023 1032 Last data filed at 12/05/2023 0300 Gross per 24 hour  Intake 100 ml  Output 1000 ml  Net -900 ml    Filed Weights   12/04/23 0420 12/04/23 0753 12/05/23 0500  Weight: 82.1 kg 74.7 kg 75.1 kg    Examination:  General exam: appears chronically ill   Respiratory system: diminished breath sounds b/l, no wheeze Cardiovascular system: S1/S2+. No rubs or gallops  Gastrointestinal system: abd is soft, NT  Central nervous system: alert & oriented. Sleepy, moving all 4, equal strength Psychiatry: Flat mood and affect    Data Reviewed: I have personally reviewed following labs and imaging studies  CBC: Recent Labs  Lab 11/30/23 0235 12/01/23 0416 12/02/23 0430 12/03/23 0352 12/04/23 0508  WBC 14.8* 14.5* 10.3 12.1* 12.1*  HGB 7.7* 7.4* 8.2* 7.8* 8.6*  HCT 23.1* 22.6* 24.2* 23.6* 27.7*  MCV 90.9 91.5 91.0 90.1 93.9  PLT 271 236 229 222 228   Basic Metabolic Panel: Recent Labs  Lab 11/30/23 0235 11/30/23 0236 12/01/23 0416 12/02/23 0430 12/03/23 0352 12/04/23 0508  NA 124* 125* 127*  127* 130* 131*  K 5.3*  --  5.3* 5.7* 4.7 4.2  CL 95*  --  97* 96* 98 97*  CO2 20*  --  20* 20* 24 26  GLUCOSE 312*  --  237* 331* 224* 116*  BUN 82*  --  82* 87* 61* 38*  CREATININE 4.09*  --  4.02* 4.22* 3.19* 2.59*  CALCIUM  8.2*  --  8.4* 8.3* 8.1* 8.0*   GFR: Estimated Creatinine Clearance: 19.5 mL/min (A) (by C-G formula based on SCr of 2.59 mg/dL (H)). Liver Function Tests: Recent Labs  Lab 11/28/23 1620  AST 22  ALT 11  ALKPHOS 88  BILITOT 0.7  PROT 6.2*  ALBUMIN 2.6*   No results for input(s): LIPASE, AMYLASE in the last 168 hours. No results for input(s): AMMONIA in the last 168 hours. Coagulation Profile: No results for input(s): INR, PROTIME in the last 168 hours. Cardiac Enzymes: No results for input(s): CKTOTAL, CKMB, CKMBINDEX, TROPONINI in the last 168 hours. BNP (last 3 results) No results for input(s): PROBNP  in the last 8760 hours. HbA1C: No results for input(s): HGBA1C in the last 72 hours. CBG: Recent Labs  Lab 12/04/23 0737 12/04/23 1231 12/04/23 1545 12/04/23 2126 12/05/23 0738  GLUCAP 85 67* 110* 162* 147*   Lipid Profile: No results for input(s): CHOL, HDL, LDLCALC, TRIG, CHOLHDL, LDLDIRECT in the last 72 hours. Thyroid Function Tests: No results for input(s): TSH, T4TOTAL, FREET4, T3FREE, THYROIDAB in the last 72 hours. Anemia Panel: No results for input(s): VITAMINB12, FOLATE, FERRITIN, TIBC, IRON, RETICCTPCT in the last 72 hours. Sepsis Labs: No results for input(s): PROCALCITON, LATICACIDVEN in the last 168 hours.  Recent Results (from the past 240 hours)  Culture, blood (Routine X 2) w Reflex to ID Panel     Status: None (Preliminary result)   Collection Time: 12/04/23  6:34 PM   Specimen: BLOOD  Result Value Ref Range Status   Specimen Description BLOOD BLOOD RIGHT ARM  Final   Special Requests   Final    BOTTLES DRAWN AEROBIC AND ANAEROBIC Blood Culture adequate volume    Culture   Final    NO GROWTH < 12 HOURS Performed at [facility], [address], [place], [place] [address]    Report Status PENDING  Incomplete  Culture, blood (Routine X 2) w Reflex to ID Panel     Status: None (Preliminary result)   Collection Time: 12/04/23  6:34 PM   Specimen: BLOOD  Result Value Ref Range Status   Specimen Description BLOOD BLOOD LEFT ARM  Final   Special Requests   Final    BOTTLES DRAWN AEROBIC AND ANAEROBIC Blood Culture adequate volume   Culture   Final    NO GROWTH < 12 HOURS Performed at [facility], [address], [place], [place] [address]    Report Status PENDING  Incomplete  SARS Coronavirus 2 by RT PCR (hospital order, performed in [facility] hospital lab) *cepheid single result test* Anterior Nasal Swab     Status: None   Collection Time: 12/04/23  8:40 PM   Specimen: Anterior Nasal Swab  Result Value Ref Range Status   SARS Coronavirus 2 by RT PCR NEGATIVE NEGATIVE Final    Comment: (NOTE) SARS-CoV-2 target nucleic acids are NOT DETECTED.  The SARS-CoV-2 RNA is generally detectable in upper and lower respiratory specimens during the acute phase of infection. The lowest concentration of SARS-CoV-2 viral copies this assay can detect is 250 copies / mL. A negative result does not preclude SARS-CoV-2 infection and should not be used as the sole basis for treatment or other patient management decisions.  A negative result may occur with improper specimen collection / handling, submission of specimen other than nasopharyngeal swab, presence of viral mutation(s) within the areas targeted by this assay, and inadequate number of viral copies (<250 copies / mL). A negative result must be combined with clinical observations, patient history, and epidemiological information.  Fact Sheet for Patients:   RoadLapTop.co.za  Fact Sheet for Healthcare  Providers: http://kim-miller.com/  This test is not yet approved or  cleared by the [place]  FDA and has been authorized for detection and/or diagnosis of SARS-CoV-2 by FDA under an Emergency Use Authorization (EUA).  This EUA will remain in effect (meaning this test can be used) for the duration of the COVID-19 declaration under Section 564(b)(1) of the Act, 21 U.S.C. section 360bbb-3(b)(1), unless the authorization is terminated or revoked sooner.  Performed at [facility], [address], [place], [place] [address]   Respiratory (~  20 pathogens) panel by PCR     Status: None   Collection Time: 12/04/23 11:30 PM   Specimen: Nasopharyngeal Swab; Respiratory  Result Value Ref Range Status   Adenovirus NOT DETECTED NOT DETECTED Final   Coronavirus 229E NOT DETECTED NOT DETECTED Final    Comment: (NOTE) The Coronavirus on the Respiratory Panel, DOES NOT test for the novel  Coronavirus (2019 nCoV)    Coronavirus HKU1 NOT DETECTED NOT DETECTED Final   Coronavirus NL63 NOT DETECTED NOT DETECTED Final   Coronavirus OC43 NOT DETECTED NOT DETECTED Final   Metapneumovirus NOT DETECTED NOT DETECTED Final   Rhinovirus / Enterovirus NOT DETECTED NOT DETECTED Final   Influenza A NOT DETECTED NOT DETECTED Final   Influenza B NOT DETECTED NOT DETECTED Final   Parainfluenza Virus 1 NOT DETECTED NOT DETECTED Final   Parainfluenza Virus 2 NOT DETECTED NOT DETECTED Final   Parainfluenza Virus 3 NOT DETECTED NOT DETECTED Final   Parainfluenza Virus 4 NOT DETECTED NOT DETECTED Final   Respiratory Syncytial Virus NOT DETECTED NOT DETECTED Final   Bordetella pertussis NOT DETECTED NOT DETECTED Final   Bordetella Parapertussis NOT DETECTED NOT DETECTED Final   Chlamydophila pneumoniae NOT DETECTED NOT DETECTED Final   Mycoplasma pneumoniae NOT DETECTED NOT DETECTED Final    Comment: Performed at [facility], [address], [place], [place] [address]          Radiology Studies: DG Chest Port 1 View Result Date: 12/04/2023 CLINICAL DATA:  Fever EXAM: PORTABLE CHEST 1 VIEW COMPARISON:  11/28/2023 FINDINGS: Right internal jugular hemodialysis catheter tips are seen within the superior vena cava. Lungs appear mildly hyperinflated, stable since prior examination, suggesting changes of underlying COPD. Small right pleural effusion again noted, slightly decreased since prior examination. Focal atelectasis or infiltrate within the right costophrenic angle. Mild diffuse interstitial thickening is again noted in keeping with interstitial pulmonary edema or airway inflammation. No pneumothorax. Stable cardiomegaly. No acute bone abnormality. IMPRESSION: 1. Stable cardiomegaly. 2. Small right pleural effusion, slightly decreased since prior examination. 3. Stable interstitial thickening in keeping with interstitial pulmonary edema or airway inflammation. Electronically Signed   By: Worthy Heads M.D.   On: 12/04/2023 20:43         Scheduled Meds:  amLODipine   10 mg Oral Daily   aspirin  EC  81 mg Oral QHS   benzonatate  200 mg Oral TID   Chlorhexidine  Gluconate Cloth  6 each Topical Q0600   cloNIDine  0.1 mg Oral BID   epoetin  alfa-epbx (RETACRIT ) injection  4,000 Units Intravenous Q M,W,F-1800   guaiFENesin   600 mg Oral BID   heparin  injection (subcutaneous)  5,000 Units Subcutaneous Q8H   insulin  aspart  0-5 Units Subcutaneous QHS   insulin  aspart  0-9 Units Subcutaneous TID WC   insulin  aspart  2 Units Subcutaneous TID WC   insulin  glargine-yfgn  8 Units Subcutaneous QHS   losartan   100 mg Oral QHS   metoprolol  succinate  100 mg Oral QHS   nicotine   21 mg Transdermal Daily   rosuvastatin   5 mg Oral Daily   Continuous Infusions:  anticoagulant sodium citrate       LOS: 7 days       Suzan Erm, MD [facility]  If 7PM-7AM, please contact night-coverage [email or web address] 12/05/2023, 10:32 AM

## 2023-12-05 NOTE — Progress Notes (Signed)
 Initial Nutrition Assessment  DOCUMENTATION CODES:   Not applicable  INTERVENTION:   -Liberalize diet to regular for widest variety of meal selections -Renal MVI daily -Boost Plus TID, each supplement provides 360 kcals and 14 grams protein -RD provided Nutrition with Dialysis handout from [facility] Nutrition Care Manual; attached to AVS/ discharge summary  -Pt will also have access to RD at outpatient HD center for further reinforcement   NUTRITION DIAGNOSIS:   Increased nutrient needs related to chronic illness (ESRD on HD) as evidenced by estimated needs.  GOAL:   Patient will meet greater than or equal to 90% of their needs  MONITOR:   PO intake, Supplement acceptance  REASON FOR ASSESSMENT:   Consult Diet education  ASSESSMENT:   Pt with medical history significant for COPD, CVA, stage 4 CKD, SIADH, esophageal dysphagia treated with Botox  (last in 05/2023), DM, HLD, and HTN, last hospitalized from 2/1 to 07/26/2023 with MRSA bacteremia/sepsis/multifocal pneumonia, now presenting with shortness of breath and chest pressure.  Pt admitted with hypertensive urgency and possible new CHF.   6/16- s/p rt internal jugular HD cath placement, first HD treatment 6/18- second HD session 6/19- third HD treatment  Reviewed I/O's: -900 ml x 24 hours and -1.8 L since admission  Case discussed with RN, who reports pt is requesting a breathing treatment.   Spoke with pt at bedside, who has multiple complaints today. Pt reports feeling unwell and is short of breath and requesting breathing treatment. Pt shares that she has a poor appetite and does not like the hospital food. Pt expresses concern on renal diet (I can't even get jello or tea- the food stinks). Pt family has been bringing in food. Noted pt had a [employer] tea at bedside, which was unattempted. Meal completions 0-50%.   Pt reports tolerating HD treatments well, however, often feels tired and weak after them. RD provided  emotional support.   Reviewed wt hx; pt has experienced a 5.4% wt loss over the past week, which is significant for time frame. Suspect some wt loss is related to fluid loss from HD.   Discussed importance of good meal and supplement intake to promote healing. RD will liberalize diet due to poor oral intake. Pt also requesting chocolate Boost supplements, which she drinks at home.   Per TOC notes, plan for home with home health services. Pt has outpatient placement for HD.   Medications reviewed.   Lab Results  Component Value Date   HGBA1C 6.1 (H) 07/19/2023   PTA DM medications are 100-120 units insulin  detemir daily.   Labs reviewed: Na: 131, CBGS: 110-188 (inpatient orders for glycemic control are 0-5 units insulin  aspart daily at bedtime, 0-9 units insulin  aspart TID with meals, 2 units insulin  aspart TID with meals, and 8 units insulin  glargine-yfgn daily).    NUTRITION - FOCUSED PHYSICAL EXAM:  Flowsheet Row Most Recent Value  Orbital Region No depletion  Upper Arm Region No depletion  Thoracic and Lumbar Region No depletion  Buccal Region No depletion  Temple Region No depletion  Clavicle Bone Region No depletion  Clavicle and Acromion Bone Region No depletion  Scapular Bone Region No depletion  Dorsal Hand No depletion  Patellar Region No depletion  Anterior Thigh Region No depletion  Posterior Calf Region No depletion  Edema (RD Assessment) Mild  Hair Reviewed  Eyes Reviewed  Mouth Reviewed  Skin Reviewed  Nails Reviewed    Diet Order:   Diet Order  Diet regular Fluid consistency: Thin  Diet effective now                   EDUCATION NEEDS:   Education needs have been addressed  Skin:  Skin Assessment: Reviewed RN Assessment  Last BM:  11/29/23  Height:   Ht Readings from Last 1 Encounters:  12/01/23 5' 7 (1.702 m)    Weight:   Wt Readings from Last 1 Encounters:  12/05/23 75.1 kg    Ideal Body Weight:  61.4 kg  BMI:  Body  mass index is 25.93 kg/m.  Estimated Nutritional Needs:   Kcal:  1800-2000  Protein:  90-105 grams  Fluid:  1000 ml + UOP    Herschel Lords, RD, LDN, CDCES Registered Dietitian III Certified Diabetes Care and Education Specialist If unable to reach this RD, please use RD Inpatient group chat on secure chat between hours of 8am-4 pm daily

## 2023-12-05 NOTE — Progress Notes (Signed)
 Occupational Therapy Treatment Patient Details Name: Mckenzie Lawrence MRN: [id number] DOB: 08-Jul-1945 Today's Date: 12/05/2023   History of present illness 78 y/o female presented to ED on 11/28/23 for worsening SOB and chest pressure. Admitted for ESRD and acute CHF exacerbation. PMH: CAD, HTN, COPD, OSA, hx of CVA, CKD stage 4, SIADH, T2DM   OT comments  Pt is supine in bed on arrival. Reports increased fatigue and weakness, but is agreeable to OT session. She denies pain. Pt performed bed mobility with CGA, increased time and effort. Pt required Min A for STS from EOB and SPT to BSC using RW. Supervision for seated anterior hygiene, Max A for clothing management. Pt was handed off to PT to ambulate prior to returning to bed. She is much weaker than last session and recommendation has been updated to STR at this time. She will cont to require skilled acute OT services to maximize his safety and IND to return to PLOF.       If plan is discharge home, recommend the following:  A little help with walking and/or transfers;Help with stairs or ramp for entrance;Assistance with cooking/housework;A lot of help with bathing/dressing/bathroom   Equipment Recommendations  None recommended by OT;Other (comment) (has needed equipment)    Recommendations for Other Services      Precautions / Restrictions Precautions Precautions: Fall Recall of Precautions/Restrictions: Intact Restrictions Weight Bearing Restrictions Per Provider Order: No       Mobility Bed Mobility Overal bed mobility: Needs Assistance Bed Mobility: Supine to Sit, Sit to Supine     Supine to sit: Contact guard Sit to supine: Contact guard assist   General bed mobility comments: increased time and effort to perform    Transfers Overall transfer level: Needs assistance Equipment used: Rolling walker (2 wheels) Transfers: Sit to/from Stand, Bed to chair/wheelchair/BSC Sit to Stand: Min assist, +2 safety/equipment, +2  physical assistance     Step pivot transfers: Min assist, +2 safety/equipment, +2 physical assistance     General transfer comment: Min A X2 to stand from EOB and SPT to [facility]; handoff to PT     Balance Overall balance assessment: Needs assistance Sitting-balance support: No upper extremity supported, Feet supported Sitting balance-Leahy Scale: Fair     Standing balance support: Bilateral upper extremity supported, During functional activity, Reliant on assistive device for balance Standing balance-Leahy Scale: Fair Standing balance comment: Min to CGA with RW                           ADL either performed or assessed with clinical judgement   ADL Overall ADL's : Needs assistance/impaired                         Toilet Transfer: Minimal assistance;BSC/3in1;Rolling walker (2 wheels);Stand-pivot Statistician Details (indicate cue type and reason): min A x1 for STS from [facility]; Min A x2 for SPT to [facility] Toileting- Clothing Manipulation and Hygiene: Sitting/lateral lean;Maximal assistance Toileting - Clothing Manipulation Details (indicate cue type and reason): anterior hygiene supervision seated; Max A for clothing management     Functional mobility during ADLs: Contact guard assist;Rolling walker (2 wheels)      Extremity/Trunk Assessment              Vision       Perception     Praxis     Communication Communication Communication: No apparent difficulties   Cognition Arousal: Alert Behavior During  Therapy: WFL for tasks assessed/performed                                 Following commands: Intact        Cueing      Exercises Other Exercises Other Exercises: Edu on performing bed level BLE exercises to maximize strength    Shoulder Instructions       General Comments pt reports increased fatigue and limited session this date    Pertinent Vitals/ Pain       Pain Assessment Pain Assessment: No/denies pain  Home  Living                                          Prior Functioning/Environment              Frequency  Min 2X/week        Progress Toward Goals  OT Goals(current goals can now be found in the care plan section)  Progress towards OT goals: Progressing toward goals  Acute Rehab OT Goals Patient Stated Goal: improve strength OT Goal Formulation: With patient/family Time For Goal Achievement: 12/17/23 Potential to Achieve Goals: Good  Plan      Co-evaluation                 AM-PAC OT 6 Clicks Daily Activity     Outcome Measure   Help from another person eating meals?: None Help from another person taking care of personal grooming?: None Help from another person toileting, which includes using toliet, bedpan, or urinal?: A Little Help from another person bathing (including washing, rinsing, drying)?: A Little Help from another person to put on and taking off regular upper body clothing?: None Help from another person to put on and taking off regular lower body clothing?: A Little 6 Click Score: 21    End of Session Equipment Utilized During Treatment: Rolling walker (2 wheels)  OT Visit Diagnosis: Other abnormalities of gait and mobility (R26.89);Muscle weakness (generalized) (M62.81)   Activity Tolerance Patient tolerated treatment well;Patient limited by fatigue   Patient Left in bed;with call bell/phone within reach;with family/visitor present   Nurse Communication Mobility status        Time: 4098-1191 OT Time Calculation (min): 8 min  Charges: OT General Charges $OT Visit: 1 Visit OT Treatments $Self Care/Home Management : 8-22 mins  Harnoor Reta, OTR/L  12/05/23, 1:03 PM   Refael Fulop E Evia Goldsmith 12/05/2023, 1:01 PM

## 2023-12-05 NOTE — Discharge Instructions (Signed)
 Mckenzie Lawrence

## 2023-12-05 NOTE — Progress Notes (Signed)
 Physical Therapy Treatment Patient Details Name: Mckenzie Lawrence MRN: [id number] DOB: [birth year] Today's Date: 12/05/2023   History of Present Illness 78 y/o female presented to ED on 11/28/23 for worsening SOB and chest pressure. Admitted for ESRD and acute CHF exacerbation. PMH: CAD, HTN, COPD, OSA, hx of CVA, CKD stage 4, SIADH, T2DM    PT Comments  Patient endorsing extreme fatigue this date and required encouragement to participate in limited session this date. Required minA to stand from [facility] and ambulated 8' with RW and CGA prior to patient requesting to return to bed. Patient declining further mobility due to fatigue despite encouragement. Updated discharge recommendation to reflect current functional level.     If plan is discharge home, recommend the following: A little help with walking and/or transfers;A little help with bathing/dressing/bathroom;Assistance with cooking/housework;Assist for transportation;Help with stairs or ramp for entrance   Can travel by private vehicle     Yes  Equipment Recommendations  None recommended by PT    Recommendations for Other Services       Precautions / Restrictions Precautions Precautions: Fall Recall of Precautions/Restrictions: Intact Restrictions Weight Bearing Restrictions Per Provider Order: No     Mobility  Bed Mobility Overal bed mobility: Needs Assistance Bed Mobility: Supine to Sit, Sit to Supine     Supine to sit: Contact guard Sit to supine: Contact guard assist   General bed mobility comments: increased time and effort to perform    Transfers Overall transfer level: Needs assistance Equipment used: Rolling [place] Leinen (2 wheels) Transfers: Sit to/from Stand, Bed to chair/wheelchair/BSC Sit to Stand: Min assist           General transfer comment: Min A to stand from [facility]    Ambulation/Gait Ambulation/Gait assistance: Contact guard assist Gait Distance (Feet): 8 Feet Assistive device: Rolling Raygen Dahm (2  wheels) Gait Pattern/deviations: Step-through pattern, Decreased stride length, Trunk flexed Gait velocity: decreased     General Gait Details: CGA for safety. Patient endorsing extreme fatigue and declined further mobility   Stairs             Wheelchair Mobility     Tilt Bed    Modified Rankin (Stroke Patients Only)       Balance Overall balance assessment: Needs assistance Sitting-balance support: No upper extremity supported, Feet supported Sitting balance-Leahy Scale: Fair     Standing balance support: Bilateral upper extremity supported, During functional activity, Reliant on assistive device for balance Standing balance-Leahy Scale: Fair Standing balance comment: Min to CGA with RW                            Communication Communication Communication: No apparent difficulties  Cognition Arousal: Alert Behavior During Therapy: WFL for tasks assessed/performed   PT - Cognitive impairments: No apparent impairments                         Following commands: Intact      Cueing    Exercises      General Comments General comments (skin integrity, edema, etc.): pt reports increased fatigue and limited session this date      Pertinent Vitals/Pain Pain Assessment Pain Assessment: No/denies pain    Home Living                          Prior Function  PT Goals (current goals can now be found in the care plan section) Acute Rehab PT Goals PT Goal Formulation: With patient Time For Goal Achievement: 12/16/23 Potential to Achieve Goals: Good Progress towards PT goals: Not progressing toward goals - comment    Frequency    Min 2X/week      PT Plan      Co-evaluation              AM-PAC PT 6 Clicks Mobility   Outcome Measure  Help needed turning from your back to your side while in a flat bed without using bedrails?: A Little Help needed moving from lying on your back to sitting on the  side of a flat bed without using bedrails?: A Little Help needed moving to and from a bed to a chair (including a wheelchair)?: A Little Help needed standing up from a chair using your arms (e.g., wheelchair or bedside chair)?: A Little Help needed to walk in hospital room?: A Little Help needed climbing 3-5 steps with a railing? : A Little 6 Click Score: 18    End of Session   Activity Tolerance: Patient limited by fatigue Patient left: in bed;with call bell/phone within reach;with bed alarm set Nurse Communication: Mobility status PT Visit Diagnosis: Unsteadiness on feet (R26.81);Muscle weakness (generalized) (M62.81);Other abnormalities of gait and mobility (R26.89)     Time: 1610-9604 PT Time Calculation (min) (ACUTE ONLY): 9 min  Charges:    $Therapeutic Activity: 8-22 mins PT General Charges $$ ACUTE PT VISIT: 1 Visit                     Mckenzie Lawrence, PT, DPT Physical Therapist - [facility]  [facility]    Mckenzie Lawrence 12/05/2023, 2:16 PM

## 2023-12-05 NOTE — Plan of Care (Signed)
  Problem: Coping: Goal: Ability to adjust to condition or change in health will improve Outcome: Progressing   Problem: Fluid Volume: Goal: Ability to maintain a balanced intake and output will improve Outcome: Progressing   Problem: Metabolic: Goal: Ability to maintain appropriate glucose levels will improve Outcome: Progressing   Problem: Tissue Perfusion: Goal: Adequacy of tissue perfusion will improve Outcome: Progressing   Problem: Activity: Goal: Risk for activity intolerance will decrease Outcome: Progressing

## 2023-12-05 NOTE — Progress Notes (Signed)
 [facility] CLINIC CARDIOLOGY PROGRESS NOTE       Patient ID: Mckenzie Lawrence MRN: [id number] DOB/AGE: [birth year] 78 y.o.  Admit date: 11/28/2023 Referring Physician Dr. Brion Cancel Primary Physician Lyle San, MD Primary Cardiologist Dr. Parks Bollman Reason for Consultation diastolic heart failure with SOB.  HPI: Mckenzie Lawrence is a 78 y.o. female  with a past medical history of coronary artery disease, hypertension, hyperlipidemia, COPD, OSA, hx CVA, CKD stage 4, SIADH, diabetes mellitus who presented to the ED on 11/28/2023 for worsening and persistent SOB and lower extremity swelling with concurrent COPD exacerbation and found elevated BNP of 2500. Cardiology was consulted for further evaluation.   Interval History: -Patient seen and examined this afternoon. Patient sitting up and eating breakfast. Patient states she feels tired and still has generalized body aches. Patient states her breathing feels okay today and denies chest pain. -Patients BP improving and HR stable. Overnight Tele showed no significant events.  -Patient remains on room air with stable SpO2.  -Patient underwent 3rd dialysis session yesterday and tolerated well -Outpatient placement still pending.   Review of systems complete and found to be negative unless listed above    Past Medical History:  Diagnosis Date   Anemia    Arthritis    Bladder incontinence    Broken foot    Cataracts, bilateral    Chronic kidney insufficiency    stage 3b   COPD (chronic obstructive pulmonary disease) (HCC)    wheezing   Coronary artery disease 04/25/2022   in [place]   COVID 2021   very mild case   CVA (cerebral vascular accident) (HCC) 2016   has had 3 strokes, states right side is slightlyweaker than left   Diabetes mellitus    insulin  dependent, Type 2   GERD (gastroesophageal reflux disease)    HLD (hyperlipidemia)    Hx of cardiovascular stress test    a. ETT (6/13):  Ex 5:13; no ischemic changes   Hypertension     controlled on meds   Lacunar stroke of left subthalamic region [facility]) 02/2015   Leg pain    left   Lower back pain    Neuromuscular disorder (HCC)    stroke right hand tingling   Orthostatic hypotension    Osteopenia 01/2017   T score -2.0 stable from prior DEXA   Pancreatitis 10/2021   PCOS (polycystic ovarian syndrome)    Personal history of tobacco use, presenting hazards to health 01/09/2015   PONV (postoperative nausea and vomiting)    Sleep apnea    uses CPAP nightly    Past Surgical History:  Procedure Laterality Date   BIOPSY  08/28/2022   Procedure: BIOPSY;  Surgeon: Felecia Hopper, MD;  Location: [facility];  Service: Gastroenterology;;   BOTOX  INJECTION  01/15/2022   Procedure: BOTOX  INJECTION;  Surgeon: Ozell Blunt, MD;  Location: [facility];  Service: Gastroenterology;;   BOTOX  INJECTION N/A 08/28/2022   Procedure: BOTOX  INJECTION;  Surgeon: Felecia Hopper, MD;  Location: [facility];  Service: Gastroenterology;  Laterality: N/A;   BOTOX  INJECTION Bilateral 05/27/2023   Procedure: BOTOX  INJECTION;  Surgeon: Ozell Blunt, MD;  Location: [facility];  Service: Gastroenterology;  Laterality: Bilateral;   BROW LIFT Bilateral 11/04/2017   Procedure: BLEPHAROPLASTY UPPER EYELID W/EXCESS SKIN;  Surgeon: Zacarias Hermann, MD;  Location: [facility];  Service: Ophthalmology;  Laterality: Bilateral;  DIABETES-insulin  dependent uses CPAP   CARDIAC CATHETERIZATION  20 yrs ago   found nothing   CARPAL TUNNEL RELEASE Bilateral  CATARACT EXTRACTION Bilateral    DIALYSIS/PERMA CATHETER INSERTION N/A 12/01/2023   Procedure: DIALYSIS/PERMA CATHETER INSERTION;  Surgeon: Celso College, MD;  Location: [facility];  Service: Cardiovascular;  Laterality: N/A;   ELBOW SURGERY Bilateral    ESOPHAGOGASTRODUODENOSCOPY N/A 07/25/2021   Procedure: ESOPHAGOGASTRODUODENOSCOPY (EGD);  Surgeon: Toledo, Alphonsus Jeans, MD;  Location: [facility];  Service: Gastroenterology;   Laterality: N/A;  IDDM   ESOPHAGOGASTRODUODENOSCOPY N/A 01/15/2022   Procedure: ESOPHAGOGASTRODUODENOSCOPY (EGD);  Surgeon: Ozell Blunt, MD;  Location: [facility];  Service: Gastroenterology;  Laterality: N/A;  botox    ESOPHAGOGASTRODUODENOSCOPY (EGD) WITH PROPOFOL  N/A 08/28/2022   Procedure: ESOPHAGOGASTRODUODENOSCOPY (EGD) WITH PROPOFOL ;  Surgeon: Felecia Hopper, MD;  Location: [facility];  Service: Gastroenterology;  Laterality: N/A;   ESOPHAGOGASTRODUODENOSCOPY (EGD) WITH PROPOFOL  Bilateral 05/27/2023   Procedure: ESOPHAGOGASTRODUODENOSCOPY (EGD) WITH PROPOFOL ;  Surgeon: Ozell Blunt, MD;  Location: [facility];  Service: Gastroenterology;  Laterality: Bilateral;   FOOT SURGERY     Groin Abscess     HAND SURGERY     KNEE SURGERY Bilateral    LABIAL ABSCESS     LUMBAR LAMINECTOMY/DECOMPRESSION MICRODISCECTOMY Left 05/11/2018   Procedure: LUMBAR LAMINECTOMY/DECOMPRESSION MICRODISCECTOMY 1 LEVEL- L4-5;  Surgeon: Berta Brittle, MD;  Location: [facility];  Service: Neurosurgery;  Laterality: Left;   LUMBAR LAMINECTOMY/DECOMPRESSION MICRODISCECTOMY Left 05/20/2022   Procedure: MICRODISCECTOMY L3-4;  Surgeon: Garry Kansas, MD;  Location: [facility] OR;  Service: Neurosurgery;  Laterality: Left;  3C   LUMBAR WOUND DEBRIDEMENT N/A 08/08/2022   Procedure: INCISION AND DRAINAGE OF LUMBAR WOUND;  Surgeon: Garry Kansas, MD;  Location: [facility] OR;  Service: Neurosurgery;  Laterality: N/A;  3C   OOPHORECTOMY     BSO   PUBO VAG SLING     SHOULDER SURGERY     bilateral arthroscopies   TEE WITHOUT CARDIOVERSION N/A 07/25/2023   Procedure: TRANSESOPHAGEAL ECHOCARDIOGRAM (TEE);  Surgeon: Dorita Garter, MD;  Location: [facility];  Service: Cardiovascular;  Laterality: N/A;   VAGINAL HYSTERECTOMY  1979    Medications Prior to Admission  Medication Sig Dispense Refill Last Dose/Taking   acetaminophen  (TYLENOL ) 500 MG tablet Take 1,000 mg by mouth every 6 (six) hours as needed for moderate pain.   Unknown    albuterol  (VENTOLIN  HFA) 108 (90 Base) MCG/ACT inhaler Inhale 2 puffs into the lungs every 6 (six) hours as needed for wheezing or shortness of breath.   Unknown   aspirin  EC 81 MG tablet Take 81 mg by mouth at bedtime. Swallow whole.   Taking   calcitRIOL  (ROCALTROL ) 0.25 MCG capsule Take 0.25 mcg by mouth at bedtime.   Taking   cholecalciferol (VITAMIN D3) 25 MCG (1000 UNIT) tablet Take 1,000 Units by mouth daily.   Taking   cyclobenzaprine  (FLEXERIL ) 10 MG tablet Take 1 tablet (10 mg total) by mouth 3 (three) times daily as needed for muscle spasms. 30 tablet 0 Taking As Needed   D-MANNOSE PO Take 2 tablets by mouth at bedtime.   Taking   diazepam  (VALIUM ) 5 MG tablet Take 5 mg by mouth 2 (two) times daily as needed.   Taking As Needed   diphenhydrAMINE (BENADRYL) 25 MG tablet Take 50 mg by mouth at bedtime as needed for sleep.   Taking As Needed   docusate sodium  (COLACE) 100 MG capsule Take 1 capsule (100 mg total) by mouth 2 (two) times daily. (Patient taking differently: Take 100 mg by mouth at bedtime as needed for mild constipation or moderate constipation.) 30 capsule 0 Taking Differently   estradiol  (  ESTRACE ) 0.1 MG/GM vaginal cream Place 1 Applicatorful vaginally 3 (three) times a week.   Taking   Fe Fum-Vit C-Vit B12-FA (TRIGELS-F FORTE) CAPS capsule Take 1 capsule by mouth daily after breakfast. 90 capsule 0 Taking   insulin  glargine-yfgn (SEMGLEE ) 100 UNIT/ML Pen Inject 10 Units into the skin 2 (two) times daily.   Taking   losartan  (COZAAR ) 100 MG tablet Take 1 tablet (100 mg total) by mouth daily. (Patient taking differently: Take 100 mg by mouth at bedtime.) 30 tablet 2 Taking Differently   metoprolol  succinate (TOPROL -XL) 100 MG 24 hr tablet Take 100 mg by mouth at bedtime.   Taking   Multiple Vitamins-Minerals (PRESERVISION AREDS 2+MULTI VIT PO) Take 1 capsule by mouth daily.   Taking   nitrofurantoin  (MACRODANTIN ) 100 MG capsule Take 100 mg by mouth at bedtime.   Taking    rosuvastatin  (CRESTOR ) 5 MG tablet Take 5 mg by mouth daily.   Taking   sennosides-docusate sodium  (SENOKOT-S) 8.6-50 MG tablet Take 1 tablet by mouth daily as needed for constipation.   Taking As Needed   sodium bicarbonate  650 MG tablet Take 1 tablet (650 mg total) by mouth 2 (two) times daily. (Patient taking differently: Take 650 mg by mouth daily.) 100 tablet 1 Taking Differently   sucralfate  (CARAFATE ) 1 g tablet Take 1 tablet (1 g total) by mouth 2 (two) times daily as needed (acid reflux).   Taking As Needed   Vibegron (GEMTESA) 75 MG TABS Take 75 mg by mouth at bedtime.   Taking   zolpidem  (AMBIEN ) 10 MG tablet Take 10 mg by mouth at bedtime as needed for sleep.   Taking As Needed   amLODipine  (NORVASC ) 10 MG tablet Take 1 tablet (10 mg total) by mouth daily. (Patient not taking: Reported on 11/28/2023) 30 tablet 2 Not Taking   BD INSULIN  SYRINGE U/F 31G X 5/16 1 ML MISC USE 1 SYRINGE AS DIRECTED      Continuous Glucose Sensor (FREESTYLE LIBRE 2 SENSOR) MISC       epoetin  alfa-epbx (RETACRIT ) 4000 UNIT/ML injection Inject 1 mL (4,000 Units total) into the skin once a week. (Patient not taking: Reported on 11/28/2023) 4 mL 3 Not Taking   feeding supplement (BOOST HIGH PROTEIN) LIQD Take 1 Container by mouth 2 (two) times daily between meals.      insulin  detemir (LEVEMIR ) 100 UNIT/ML injection Inject 100-120 Units into the skin daily. (Patient not taking: Reported on 11/28/2023)   Not Taking   nystatin  (MYCOSTATIN ) 100000 UNIT/ML suspension Take 5 mLs (500,000 Units total) by mouth 4 (four) times daily. (Patient not taking: Reported on 11/28/2023) 120 mL 1 Not Taking   ONETOUCH ULTRA test strip 4 (four) times daily.      Social History   Socioeconomic History   Marital status: Married    Spouse name: Not on file   Number of children: 1   Years of education: Not on file   Highest education level: Not on file  Occupational History   Not on file  Tobacco Use   Smoking status: Every Day     Current packs/day: 1.50    Average packs/day: 1.5 packs/day for 50.0 years (75.0 ttl pk-yrs)    Types: Cigarettes   Smokeless tobacco: Never   Tobacco comments:    3ppd x 10 year, then cut back to 1.5pdd since 02/2015  Vaping Use   Vaping status: Never Used  Substance and Sexual Activity   Alcohol use: Never    Alcohol/week:  0.0 standard drinks of alcohol   Drug use: No   Sexual activity: Not Currently    Birth control/protection: Surgical    Comment: Hx Hysterectomy, 1st intercourse 78 yo-Fewer than 5 partners  Other Topics Concern   Not on file  Social History Narrative   Lives at home with husband in a one story home.  Has 1 daughter.     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     Education: some college.   Social Drivers of Corporate investment banker Strain: Low Risk  (10/31/2023)   Received from [facility]   Overall Financial Resource Strain (CARDIA)    Difficulty of Paying Living Expenses: Not hard at all  Food Insecurity: No Food Insecurity (11/28/2023)   Hunger Vital Sign    Worried About Running Out of Food in the Last Year: Never true    Ran Out of Food in the Last Year: Never true  Transportation Needs: No Transportation Needs (11/28/2023)   PRAPARE - Administrator, Civil Service (Medical): No    Lack of Transportation (Non-Medical): No  Physical Activity: Not on file  Stress: Not on file  Social Connections: Unknown (11/28/2023)   Social Connection and Isolation Panel    Frequency of Communication with Friends and Family: More than three times a week    Frequency of Social Gatherings with Friends and Family: More than three times a week    Attends Religious Services: More than 4 times per year    Active Member of Clubs or Organizations: Not on file    Attends Banker Meetings: Not on file    Marital Status: Not on file  Intimate Partner Violence: Not At Risk (11/28/2023)   Humiliation, Afraid, Rape, and  Kick questionnaire    Fear of Current or Ex-Partner: No    Emotionally Abused: No    Physically Abused: No    Sexually Abused: No    Family History  Problem Relation Age of Onset   Hypertension Mother    Heart disease Mother 28       MI   Diabetes Father    Diabetes Sister    Hypertension Sister    Diabetes Brother    Hypertension Brother    Heart disease Brother 62       CAD   Cancer Sister        Multiple myloma   Diabetes Brother      Vitals:   12/05/23 0538 12/05/23 0600 12/05/23 0739 12/05/23 1130  BP: (!) 144/46 (!) 125/58 (!) 156/43 (!) 129/42  Pulse: 80 75 82 78  Resp: 19 18 18 19   Temp: 98.4 F (36.9 C) 98.1 F (36.7 C) 98.7 F (37.1 C) 99.5 F (37.5 C)  TempSrc: Oral Oral Oral Oral  SpO2: 93% 94% 94% 93%  Weight:      Height:        PHYSICAL EXAM General: well appearing elderly female, well nourished, in no acute distress. HEENT: Normocephalic and atraumatic. Neck: No JVD.   Lungs: Normal respiratory effort on room air. Diminished breath sounds bilaterally, bibasilar crackles.  Heart: HRRR. Normal S1 and S2, + murmur  Abdomen: Non-distended appearing.  Msk: Normal strength and tone for age. Extremities: Warm and well perfused. No clubbing, cyanosis. Trace pedal edema bilaterally.  Neuro: Alert and oriented X 3. Psych: Answers questions appropriately.   Labs: Basic Metabolic Panel: Recent Labs    12/03/23 0352 12/04/23 0508  NA  130* 131*  K 4.7 4.2  CL 98 97*  CO2 24 26  GLUCOSE 224* 116*  BUN 61* 38*  CREATININE 3.19* 2.59*  CALCIUM  8.1* 8.0*   Liver Function Tests: No results for input(s): AST, ALT, ALKPHOS, BILITOT, PROT, ALBUMIN in the last 72 hours.  No results for input(s): LIPASE, AMYLASE in the last 72 hours. CBC: Recent Labs    12/03/23 0352 12/04/23 0508  WBC 12.1* 12.1*  HGB 7.8* 8.6*  HCT 23.6* 27.7*  MCV 90.1 93.9  PLT 222 228   Cardiac Enzymes: No results for input(s): CKTOTAL, CKMB,  CKMBINDEX, TROPONINIHS in the last 72 hours. BNP: No results for input(s): BNP in the last 72 hours. D-Dimer: No results for input(s): DDIMER in the last 72 hours. Hemoglobin A1C: No results for input(s): HGBA1C in the last 72 hours. Fasting Lipid Panel: No results for input(s): CHOL, HDL, LDLCALC, TRIG, CHOLHDL, LDLDIRECT in the last 72 hours. Thyroid Function Tests: No results for input(s): TSH, T4TOTAL, T3FREE, THYROIDAB in the last 72 hours.  Invalid input(s): FREET3 Anemia Panel: No results for input(s): VITAMINB12, FOLATE, FERRITIN, TIBC, IRON, RETICCTPCT in the last 72 hours.   Radiology: [facility] Chest Port 1 View Result Date: 12/04/2023 CLINICAL DATA:  Fever EXAM: PORTABLE CHEST 1 VIEW COMPARISON:  11/28/2023 FINDINGS: Right internal jugular hemodialysis catheter tips are seen within the superior vena cava. Lungs appear mildly hyperinflated, stable since prior examination, suggesting changes of underlying COPD. Small right pleural effusion again noted, slightly decreased since prior examination. Focal atelectasis or infiltrate within the right costophrenic angle. Mild diffuse interstitial thickening is again noted in keeping with interstitial pulmonary edema or airway inflammation. No pneumothorax. Stable cardiomegaly. No acute bone abnormality. IMPRESSION: 1. Stable cardiomegaly. 2. Small right pleural effusion, slightly decreased since prior examination. 3. Stable interstitial thickening in keeping with interstitial pulmonary edema or airway inflammation. Electronically Signed   By: Worthy Heads M.D.   On: 12/04/2023 20:43   PERIPHERAL VASCULAR CATHETERIZATION Result Date: 12/01/2023 See surgical note for result.  ECHOCARDIOGRAM COMPLETE Result Date: 11/29/2023    ECHOCARDIOGRAM REPORT   Patient Name:   Mckenzie Lawrence Date of Exam: 11/29/2023 Medical Rec #:  [id number]      Height:       67.0 in Accession #:    [id number]     Weight:       176.6  lb Date of Birth:  16-Sep-1945      BSA:          1.918 m Patient Age:    78 years       BP:           163/51 mmHg Patient Gender: F              HR:           81 bpm. Exam Location:  [facility] Procedure: 2D Echo, Cardiac Doppler and Color Doppler (Both Spectral and Color            Flow Doppler were utilized during procedure). Indications:     CHF I50.31  History:         Patient has prior history of Echocardiogram examinations, most                  recent 07/23/2023.  Sonographer:     Clenton Czech RDCS, FASE Referring Phys:  [id number] Alphonsus Jeans Diagnosing Phys: Dwayne D Callwood MD IMPRESSIONS  1. Left ventricular ejection fraction, by estimation, is 55 to 60%.  The left ventricle has normal function. The left ventricle has no regional wall motion abnormalities. The left ventricular internal cavity size was moderately dilated. Left ventricular diastolic parameters are consistent with Grade II diastolic dysfunction (pseudonormalization).  2. Right ventricular systolic function is normal. The right ventricular size is normal.  3. Left atrial size was mildly dilated.  4. The mitral valve is normal in structure. Trivial mitral valve regurgitation.  5. The aortic valve is normal in structure. Aortic valve regurgitation is not visualized. FINDINGS  Left Ventricle: Left ventricular ejection fraction, by estimation, is 55 to 60%. The left ventricle has normal function. The left ventricle has no regional wall motion abnormalities. Strain was performed and the global longitudinal strain is indeterminate. The left ventricular internal cavity size was moderately dilated. There is no left ventricular hypertrophy. Left ventricular diastolic parameters are consistent with Grade II diastolic dysfunction (pseudonormalization). Right Ventricle: The right ventricular size is normal. No increase in right ventricular wall thickness. Right ventricular systolic function is normal. Left Atrium: Left atrial size was mildly dilated.  Right Atrium: Right atrial size was normal in size. Pericardium: There is no evidence of pericardial effusion. Mitral Valve: The mitral valve is normal in structure. Trivial mitral valve regurgitation. Tricuspid Valve: The tricuspid valve is normal in structure. Tricuspid valve regurgitation is mild. Aortic Valve: The aortic valve is normal in structure. Aortic valve regurgitation is not visualized. Aortic valve peak gradient measures 8.8 mmHg. Pulmonic Valve: The pulmonic valve was normal in structure. Pulmonic valve regurgitation is not visualized. Aorta: The ascending aorta was not well visualized. IAS/Shunts: No atrial level shunt detected by color flow Doppler. Additional Comments: 3D was performed not requiring image post processing on an independent workstation and was indeterminate.  LEFT VENTRICLE PLAX 2D LVIDd:         5.50 cm     Diastology LVIDs:         3.80 cm     LV e' medial:    7.18 cm/s LV PW:         1.00 cm     LV E/e' medial:  19.9 LV IVS:        1.10 cm     LV e' lateral:   8.38 cm/s LVOT diam:     1.80 cm     LV E/e' lateral: 17.1 LV SV:         63 LV SV Index:   33 LVOT Area:     2.54 cm  LV Volumes (MOD) LV vol d, MOD A2C: 74.1 ml LV vol d, MOD A4C: 70.9 ml LV vol s, MOD A2C: 37.0 ml LV vol s, MOD A4C: 28.8 ml LV SV MOD A2C:     37.1 ml LV SV MOD A4C:     70.9 ml LV SV MOD BP:      41.0 ml RIGHT VENTRICLE RV Basal diam:  3.20 cm RV S prime:     14.80 cm/s TAPSE (M-mode): 2.6 cm LEFT ATRIUM             Index        RIGHT ATRIUM           Index LA diam:        4.30 cm 2.24 cm/m   RA Area:     19.70 cm LA Vol (A2C):   44.9 ml 23.41 ml/m  RA Volume:   55.40 ml  28.88 ml/m LA Vol (A4C):   53.8 ml 28.05 ml/m LA Biplane Vol: 51.9  ml 27.06 ml/m  AORTIC VALVE                 PULMONIC VALVE AV Area (Vmax): 1.84 cm     PV Vmax:        1.01 m/s AV Vmax:        148.00 cm/s  PV Peak grad:   4.1 mmHg AV Peak Grad:   8.8 mmHg     RVOT Peak grad: 3 mmHg LVOT Vmax:      107.00 cm/s LVOT Vmean:      76.000 cm/s LVOT VTI:       0.249 m  AORTA Ao Root diam: 2.90 cm MITRAL VALVE MV Area (PHT): 4.86 cm     SHUNTS MV Decel Time: 156 msec     Systemic VTI:  0.25 m MV E velocity: 143.00 cm/s  Systemic Diam: 1.80 cm MV A velocity: 80.30 cm/s MV E/A ratio:  1.78 Dwayne D Callwood MD Electronically signed by Antonette Batters MD Signature Date/Time: 11/29/2023/10:33:04 AM    Final    US  Venous Img Lower Bilateral Result Date: 11/28/2023 CLINICAL DATA:  New onset left greater than right lower extremity swelling, without findings of CHF. EXAM: BILATERAL LOWER EXTREMITY VENOUS DOPPLER ULTRASOUND TECHNIQUE: Gray-scale sonography with graded compression, as well as color Doppler and duplex ultrasound were performed to evaluate the lower extremity deep venous systems from the level of the common femoral vein and including the common femoral, femoral, profunda femoral, popliteal and calf veins including the posterior tibial, peroneal and gastrocnemius veins when visible. The superficial great saphenous vein was also interrogated. Spectral Doppler was utilized to evaluate flow at rest and with distal augmentation maneuvers in the common femoral, femoral and popliteal veins. COMPARISON:  Left lower extremity DVT exam 08/26/2019. FINDINGS: RIGHT LOWER EXTREMITY Common Femoral Vein: No evidence of thrombus. Normal compressibility, respiratory phasicity and response to augmentation. Saphenofemoral Junction: No evidence of thrombus. Normal compressibility and flow on color Doppler imaging. Profunda Femoral Vein: No evidence of thrombus. Normal compressibility and flow on color Doppler imaging. Femoral Vein: No evidence of thrombus. Normal compressibility, respiratory phasicity and response to augmentation. Popliteal Vein: No evidence of thrombus. Normal compressibility, respiratory phasicity and response to augmentation. Calf Veins: No evidence of thrombus. Normal compressibility and flow on color Doppler imaging. Superficial  Great Saphenous Vein: No evidence of thrombus. Normal compressibility. Venous Reflux:  None. Other Findings:  Pulsatile venous waveforms. LEFT LOWER EXTREMITY Common Femoral Vein: No evidence of thrombus. Normal compressibility, respiratory phasicity and response to augmentation. Saphenofemoral Junction: No evidence of thrombus. Normal compressibility and flow on color Doppler imaging. Profunda Femoral Vein: No evidence of thrombus. Normal compressibility and flow on color Doppler imaging. Femoral Vein: No evidence of thrombus. Normal compressibility, respiratory phasicity and response to augmentation. Popliteal Vein: No evidence of thrombus. Normal compressibility, respiratory phasicity and response to augmentation. Calf Veins: No evidence of thrombus. Normal compressibility and flow on color Doppler imaging. Superficial Great Saphenous Vein: No evidence of thrombus. Normal compressibility. Venous Reflux:  None. Other Findings:  Pulsatile venous waveforms. IMPRESSION: 1. No evidence of deep venous thrombosis in either lower extremity. 2. Pulsatile venous waveforms, which can be seen with right heart failure or tricuspid regurgitation. Electronically Signed   By: Denman Fischer M.D.   On: 11/28/2023 04:56   DG Chest Portable 1 View Result Date: 11/28/2023 CLINICAL DATA:  Difficulty breathing EXAM: PORTABLE CHEST 1 VIEW COMPARISON:  07/19/2023 FINDINGS: Cardiac shadow is mildly enlarged. Aortic calcifications are seen. Mild  vascular congestion is noted. Small right-sided pleural effusion is noted. Metallic densities are noted symmetrically over the upper chest likely artifactual in nature. IMPRESSION: Small right pleural effusion and vascular congestion. Electronically Signed   By: Violeta Grey M.D.   On: 11/28/2023 02:59    ECHO as above  TELEMETRY reviewed by me 12/05/2023: sinus rhythm with PACs, rate 90s  EKG reviewed by me: Normal sinus rhythm, mild right axis nonspecific ST-T wave changes   Data  reviewed by me 12/05/2023: last 24h vitals tele labs imaging I/O hospitalist progress note, nephrology notes.  Principal Problem:   Hypertensive urgency/emergency Active Problems:   Hyponatremia   CAD (coronary artery disease)   COPD with acute exacerbation (HCC)   Acute renal failure superimposed on stage 4 chronic kidney disease (HCC)   Chest pain   Hypertensive emergency   Acute dyspnea   Encounter for dialysis catheter care [facility])    ASSESSMENT AND PLAN:  DARITZA BREES is a 78 y.o. female  with a past medical history of coronary artery disease, hypertension, hyperlipidemia, COPD, OSA, hx CVA, CKD stage 4, SIADH, diabetes mellitus who presented to the ED on 11/28/2023 for worsening and persistent SOB and lower extremity swelling with concurrent COPD exacerbation and found elevated BNP of 2500. Cardiology was consulted for further evaluation.   # ESRD (Plan to start dialysis on 06/16) Permacath placement on 06/17 by Dr. Vonna Guardian. Patient underwent third dialysis session yesterday and tolerated well.  -Nephrology following, appreciate recommendations.   # Acute HFpEF exacerbation # COPD exacerbation  # OSA Patient presents with worsening SOB and LE swelling. BNP elevated at 2450. Echo this admission with pEF, no RWMA, grade II diastolic dysfunction.  -Continue metoprolol  succinate 100 mg daily.  -Continue losartan  as stated below.  -Fluid removal via dialysis.  -GDMT optimization limited due to ESRD.  -Recommend sleep study for OSA if CPAP indicated.  -Recommend breathing treatments and frequent use of incentive spirometry for COPD. Management per primary. -Recommend pulmonology consultation.   # Coronary artery disease # Hypertension # Hyperlipidemia # Demand ischemia Patient without chest pain. Trops minimally elevated and flat 19 > 20. EKG without acute ischemic changes. Echo with no RWMA.   -Continue ASA 81 mg, rosuvastatin  5 mg daily -Continue amlodipine  10 mg daily.   -Continue losartan  100 mg daily. -Continue clonidine 0.1 mg BID.  -Continue metoprolol  as stated above. -Minimally elevated and flat trop sin setting of ESRD, heart failure exacerbation is most consistent with demand/supply mismatch and not ACS.  This patient's plan of care was discussed and created with Dr. Beau Bound and he is in agreement.  Signed: Creighton Doffing, PA-C  12/05/2023, 1:51 PM [facility]

## 2023-12-05 NOTE — Progress Notes (Signed)
 [facility]  ROUNDING NOTE   Subjective:  [name] is a 78 y.o.  female with past medical history of hypertension, long-standing diabetes mellitus type 2, lower extremity edema, hyperlipidemia, tobacco abuse, prior history of CVA, COPD, obstructive sleep apnea, lumbar spinal fusion, left upper lobe lung mass who presents with volume overload and increasing shortness of breath.   Update: Patient sitting up in bed today. Due for dialysis treatment again tomorrow. Outpatient dialysis seat has been secured.   Objective:  Vital signs in last 24 hours:  Temp:  [98.1 F (36.7 C)-102.4 F (39.1 C)] 98.6 F (37 C) (06/20 1516) Pulse Rate:  [75-92] 81 (06/20 1516) Resp:  [18-19] 18 (06/20 1516) BP: (125-161)/(42-58) 161/47 (06/20 1516) SpO2:  [89 %-100 %] 100 % (06/20 1516) Weight:  [75.1 kg] 75.1 kg (06/20 0500)  Weight change: -1.6 kg Filed Weights   12/04/23 0420 12/04/23 0753 12/05/23 0500  Weight: 82.1 kg 74.7 kg 75.1 kg    Intake/Output: I/O last 3 completed shifts: In: 220 [Other:120; IV Piggyback:100] Out: 1000 [Other:1000]   Intake/Output this shift:  No intake/output data recorded.  Physical Exam: General: No acute distress  Head: Normocephalic, atraumatic. Dry mucosal membranes  Eyes: Anicteric  Neck: Supple  Lungs:  Normal breathing effort  Heart: Regular rate and rhythm  Abdomen:  Soft, nontender, BS present  Extremities: No peripheral edema.  Neurologic: Nonfocal, moving all four extremities  Skin: No lesions  Dialysis Access: Right IJ PermCath    Basic Metabolic Panel: Recent Labs  Lab 11/30/23 0235 11/30/23 0236 12/01/23 0416 12/02/23 0430 12/03/23 0352 12/04/23 0508  NA 124* 125* 127* 127* 130* 131*  K 5.3*  --  5.3* 5.7* 4.7 4.2  CL 95*  --  97* 96* 98 97*  CO2 20*  --  20* 20* 24 26  GLUCOSE 312*  --  237* 331* 224* 116*  BUN 82*  --  82* 87* 61* 38*  CREATININE 4.09*  --  4.02* 4.22* 3.19* 2.59*  CALCIUM  8.2*  --   8.4* 8.3* 8.1* 8.0*    Liver Function Tests: Recent Labs  Lab 11/28/23 1620  AST 22  ALT 11  ALKPHOS 88  BILITOT 0.7  PROT 6.2*  ALBUMIN 2.6*   No results for input(s): LIPASE, AMYLASE in the last 168 hours. No results for input(s): AMMONIA in the last 168 hours.  CBC: Recent Labs  Lab 11/30/23 0235 12/01/23 0416 12/02/23 0430 12/03/23 0352 12/04/23 0508  WBC 14.8* 14.5* 10.3 12.1* 12.1*  HGB 7.7* 7.4* 8.2* 7.8* 8.6*  HCT 23.1* 22.6* 24.2* 23.6* 27.7*  MCV 90.9 91.5 91.0 90.1 93.9  PLT 271 236 229 222 228    Cardiac Enzymes: No results for input(s): CKTOTAL, CKMB, CKMBINDEX, TROPONINI in the last 168 hours.  BNP: Invalid input(s): POCBNP  CBG: Recent Labs  Lab 12/04/23 1231 12/04/23 1545 12/04/23 2126 12/05/23 0738 12/05/23 1130  GLUCAP 67* 110* 162* 147* 188*    Microbiology: Results for orders placed or performed during the hospital encounter of 11/28/23  Culture, blood (Routine X 2) w Reflex to ID Panel     Status: None (Preliminary result)   Collection Time: 12/04/23  6:34 PM   Specimen: BLOOD  Result Value Ref Range Status   Specimen Description BLOOD BLOOD RIGHT ARM  Final   Special Requests   Final    BOTTLES DRAWN AEROBIC AND ANAEROBIC Blood Culture adequate volume   Culture   Final    NO GROWTH <  12 HOURS Performed at [facility], [address] [place], [place] [address]    Report Status PENDING  Incomplete  Culture, blood (Routine X 2) w Reflex to ID Panel     Status: None (Preliminary result)   Collection Time: 12/04/23  6:34 PM   Specimen: BLOOD  Result Value Ref Range Status   Specimen Description BLOOD BLOOD LEFT ARM  Final   Special Requests   Final    BOTTLES DRAWN AEROBIC AND ANAEROBIC Blood Culture adequate volume   Culture   Final    NO GROWTH < 12 HOURS Performed at [facility], [address] [place], [place] [address]    Report Status PENDING  Incomplete  SARS Coronavirus 2 by RT  PCR (hospital order, performed in [facility] hospital lab) *cepheid single result test* Anterior Nasal Swab     Status: None   Collection Time: 12/04/23  8:40 PM   Specimen: Anterior Nasal Swab  Result Value Ref Range Status   SARS Coronavirus 2 by RT PCR NEGATIVE NEGATIVE Final    Comment: (NOTE) SARS-CoV-2 target nucleic acids are NOT DETECTED.  The SARS-CoV-2 RNA is generally detectable in upper and lower respiratory specimens during the acute phase of infection. The lowest concentration of SARS-CoV-2 viral copies this assay can detect is 250 copies / mL. A negative result does not preclude SARS-CoV-2 infection and should not be used as the sole basis for treatment or other patient management decisions.  A negative result may occur with improper specimen collection / handling, submission of specimen other than nasopharyngeal swab, presence of viral mutation(s) within the areas targeted by this assay, and inadequate number of viral copies (<250 copies / mL). A negative result must be combined with clinical observations, patient history, and epidemiological information.  Fact Sheet for Patients:   RoadLapTop.co.za  Fact Sheet for Healthcare Providers: http://kim-miller.com/  This test is not yet approved or  cleared by the [place]  FDA and has been authorized for detection and/or diagnosis of SARS-CoV-2 by FDA under an Emergency Use Authorization (EUA).  This EUA will remain in effect (meaning this test can be used) for the duration of the COVID-19 declaration under Section 564(b)(1) of the Act, 21 U.S.C. section 360bbb-3(b)(1), unless the authorization is terminated or revoked sooner.  Performed at [facility], [address] [place], [place] [address]   Respiratory (~20 pathogens) panel by PCR     Status: None   Collection Time: 12/04/23 11:30 PM   Specimen: Nasopharyngeal Swab; Respiratory  Result Value Ref  Range Status   Adenovirus NOT DETECTED NOT DETECTED Final   Coronavirus 229E NOT DETECTED NOT DETECTED Final    Comment: (NOTE) The Coronavirus on the Respiratory Panel, DOES NOT test for the novel  Coronavirus (2019 nCoV)    Coronavirus HKU1 NOT DETECTED NOT DETECTED Final   Coronavirus NL63 NOT DETECTED NOT DETECTED Final   Coronavirus OC43 NOT DETECTED NOT DETECTED Final   Metapneumovirus NOT DETECTED NOT DETECTED Final   Rhinovirus / Enterovirus NOT DETECTED NOT DETECTED Final   Influenza A NOT DETECTED NOT DETECTED Final   Influenza B NOT DETECTED NOT DETECTED Final   Parainfluenza Virus 1 NOT DETECTED NOT DETECTED Final   Parainfluenza Virus 2 NOT DETECTED NOT DETECTED Final   Parainfluenza Virus 3 NOT DETECTED NOT DETECTED Final   Parainfluenza Virus 4 NOT DETECTED NOT DETECTED Final   Respiratory Syncytial Virus NOT DETECTED NOT DETECTED Final   Bordetella pertussis NOT DETECTED NOT DETECTED  Final   Bordetella Parapertussis NOT DETECTED NOT DETECTED Final   Chlamydophila pneumoniae NOT DETECTED NOT DETECTED Final   Mycoplasma pneumoniae NOT DETECTED NOT DETECTED Final    Comment: Performed at [facility], [address], [place], [place] [address]    Coagulation Studies: No results for input(s): LABPROT, INR in the last 72 hours.  Urinalysis: Recent Labs    12/04/23 2045  COLORURINE YELLOW*  LABSPEC 1.019  PHURINE 6.0  GLUCOSEU 150*  HGBUR NEGATIVE  BILIRUBINUR NEGATIVE  KETONESUR NEGATIVE  PROTEINUR >=300*  NITRITE NEGATIVE  LEUKOCYTESUR NEGATIVE      Imaging: DG Chest Port 1 View Result Date: 12/04/2023 CLINICAL DATA:  Fever EXAM: PORTABLE CHEST 1 VIEW COMPARISON:  11/28/2023 FINDINGS: Right internal jugular hemodialysis catheter tips are seen within the superior vena cava. Lungs appear mildly hyperinflated, stable since prior examination, suggesting changes of underlying COPD. Small right pleural effusion again noted, slightly decreased since  prior examination. Focal atelectasis or infiltrate within the right costophrenic angle. Mild diffuse interstitial thickening is again noted in keeping with interstitial pulmonary edema or airway inflammation. No pneumothorax. Stable cardiomegaly. No acute bone abnormality. IMPRESSION: 1. Stable cardiomegaly. 2. Small right pleural effusion, slightly decreased since prior examination. 3. Stable interstitial thickening in keeping with interstitial pulmonary edema or airway inflammation. Electronically Signed   By: Worthy Heads M.D.   On: 12/04/2023 20:43      Medications:    anticoagulant sodium citrate      amLODipine   10 mg Oral Daily   aspirin  EC  81 mg Oral QHS   benzonatate  200 mg Oral TID   Chlorhexidine  Gluconate Cloth  6 each Topical Q0600   cloNIDine  0.1 mg Oral BID   epoetin  alfa-epbx (RETACRIT ) injection  4,000 Units Intravenous Q M,W,F-1800   guaiFENesin   600 mg Oral BID   heparin  injection (subcutaneous)  5,000 Units Subcutaneous Q8H   insulin  aspart  0-5 Units Subcutaneous QHS   insulin  aspart  0-9 Units Subcutaneous TID WC   insulin  aspart  2 Units Subcutaneous TID WC   insulin  glargine-yfgn  8 Units Subcutaneous QHS   lactose free nutrition  237 mL Oral TID WC   losartan   100 mg Oral QHS   metoprolol  succinate  100 mg Oral QHS   multivitamin  1 tablet Oral QHS   nicotine   21 mg Transdermal Daily   rosuvastatin   5 mg Oral Daily   acetaminophen  **OR** acetaminophen , albuterol , anticoagulant sodium citrate, guaiFENesin -dextromethorphan, heparin , hydrALAZINE , ondansetron  **OR** ondansetron  (ZOFRAN ) IV, oxyCODONE -acetaminophen , sucralfate , zolpidem   Assessment/ Plan:  [name] is a 78 y.o.  female  with past medical history of hypertension, long-standing diabetes mellitus type 2, lower extremity edema, hyperlipidemia, tobacco abuse, prior history of CVA, COPD, obstructive sleep apnea, lumbar spinal fusion, left upper lobe lung mass who presents with volume  overload and increasing shortness of breath.   1.  Acute kidney injury/chronic kidney disease stage IV/diabetes mellitus type 2 with chronic kidney disease/proteinuria.  eGFR down to 11.  Baseline creatinine 2.7 in February 2025.  Creatinine increased to 3.6-4 in June 2025. - PermCath has been placed  (12/01/2023) - Patient due for fourth dialysis treatment tomorrow on 12/06/2023 -Outpatient dialysis seat at [facility] [address] has been secured, first treatment 12/09/2023.  Patient to arrive at 11:30 AM.  Lab Results  Component Value Date   CREATININE 2.59 (H) 12/04/2023   CREATININE 3.19 (H) 12/03/2023   CREATININE 4.22 (H) 12/02/2023  Intake/Output Summary (Last 24 hours) at 12/05/2023 1618 Last data filed at 12/05/2023 1038 Gross per 24 hour  Intake 100 ml  Output --  Net 100 ml       2.  Hyponatremia.  Differential includes underlying emphysema, lung nodules, AKI Continue to monitor serum sodium.  3.  Hyperkalemia Potassium normalized with dialysis treatments.   4.  HTN - 2D echo from 11/29/2023 shows LVEF 55 to 60%, grade 2 diastolic dysfunction, trivial mitral regurgitation Continue amlodipine , losartan , metoprolol , and clonidine.   5.  Anemia of chronic kidney disease.  Hemoglobin 7.9 Patient has  left upper lobe mass concerning for malignancy.  Discussed with Dr. Rosebud Confer.  Okay to use ESA.  Consider transfusion for HgB<7     LOS: 7 Juliet Vasbinder 6/20/20254:18 PM

## 2023-12-06 DIAGNOSIS — I160 Hypertensive urgency: Secondary | ICD-10-CM | POA: Diagnosis not present

## 2023-12-06 LAB — BLOOD CULTURE ID PANEL (REFLEXED) - BCID2

## 2023-12-06 LAB — URINE CULTURE: Culture: <10000 — AB

## 2023-12-06 LAB — GLUCOSE, CAPILLARY
Glucose-Capillary: 173 mg/dL — ABNORMAL HIGH (ref 70–99)
Glucose-Capillary: 190 mg/dL — ABNORMAL HIGH (ref 70–99)
Glucose-Capillary: 141 mg/dL — ABNORMAL HIGH (ref 70–99)
Glucose-Capillary: 261 mg/dL — ABNORMAL HIGH (ref 70–99)
Glucose-Capillary: 213 mg/dL — ABNORMAL HIGH (ref 70–99)

## 2023-12-06 LAB — CBC
HCT: 25.2 % — ABNORMAL LOW (ref 36.0–46.0)
Hemoglobin: 8.2 g/dL — ABNORMAL LOW (ref 12.0–15.0)
MCH: 30.6 pg (ref 26.0–34.0)
MCHC: 32.5 g/dL (ref 30.0–36.0)
MCV: 94 fL (ref 80.0–100.0)
Platelets: 193 10*3/uL (ref 150–400)
RBC: 2.68 MIL/uL — ABNORMAL LOW (ref 3.87–5.11)
RDW: 14.5 % (ref 11.5–15.5)
WBC: 15.6 10*3/uL — ABNORMAL HIGH (ref 4.0–10.5)
nRBC: 0 % (ref 0.0–0.2)

## 2023-12-06 LAB — RENAL FUNCTION PANEL
Albumin: 2 g/dL — ABNORMAL LOW (ref 3.5–5.0)
Anion gap: 11 (ref 5–15)
BUN: 32 mg/dL — ABNORMAL HIGH (ref 8–23)
CO2: 26 mmol/L (ref 22–32)
Calcium: 7.9 mg/dL — ABNORMAL LOW (ref 8.9–10.3)
Chloride: 93 mmol/L — ABNORMAL LOW (ref 98–111)
Creatinine, Ser: 3.03 mg/dL — ABNORMAL HIGH (ref 0.44–1.00)
GFR, Estimated: 15 mL/min — ABNORMAL LOW (ref >60)
Glucose, Bld: 182 mg/dL — ABNORMAL HIGH (ref 70–99)
Phosphorus: 4 mg/dL (ref 2.5–4.6)
Potassium: 3.9 mmol/L (ref 3.5–5.1)
Sodium: 130 mmol/L — ABNORMAL LOW (ref 135–145)

## 2023-12-06 MED ORDER — VANCOMYCIN VARIABLE DOSE PER UNSTABLE RENAL FUNCTION (PHARMACIST DOSING): Status: DC

## 2023-12-06 MED ORDER — VANCOMYCIN HCL 1500 MG/300ML IV SOLN
1500.0000 mg | Freq: Once | INTRAVENOUS | Status: AC
  Administered 2023-12-06: 1500 mg via INTRAVENOUS
  Filled 2023-12-06 (×2): qty 300

## 2023-12-06 MED ORDER — CLONIDINE HCL 0.1 MG PO TABS
0.2000 mg | ORAL_TABLET | Freq: Two times a day (BID) | ORAL | Status: DC
  Administered 2023-12-06 – 2023-12-15 (×15): 0.2 mg via ORAL
  Filled 2023-12-06 (×15): qty 2

## 2023-12-06 MED ORDER — HEPARIN SODIUM (PORCINE) 1000 UNIT/ML IJ SOLN
INTRAMUSCULAR | Status: AC
  Filled 2023-12-06: qty 10

## 2023-12-06 MED ORDER — EPOETIN ALFA-EPBX 4000 UNIT/ML IJ SOLN
INTRAMUSCULAR | Status: AC
  Filled 2023-12-06: qty 1

## 2023-12-06 MED ORDER — LIDOCAINE HCL 1 % IJ SOLN
10.0000 mL | Freq: Once | INTRAMUSCULAR | Status: DC
  Filled 2023-12-06: qty 10

## 2023-12-06 MED ORDER — POLYETHYLENE GLYCOL 3350 17 G PO PACK
17.0000 g | PACK | Freq: Every day | ORAL | Status: DC
  Filled 2023-12-06 (×2): qty 1

## 2023-12-06 MED ORDER — EPOETIN ALFA-EPBX 4000 UNIT/ML IJ SOLN
4000.0000 [IU] | INTRAMUSCULAR | Status: DC
  Administered 2023-12-06 – 2023-12-13 (×3): 4000 [IU] via INTRAVENOUS
  Filled 2023-12-06 (×2): qty 1

## 2023-12-06 MED ORDER — LACTULOSE 10 GM/15ML PO SOLN
30.0000 g | Freq: Once | ORAL | Status: DC
  Filled 2023-12-06: qty 60

## 2023-12-06 MED ORDER — VANCOMYCIN HCL 750 MG/150ML IV SOLN: 750.0000 mg | INTRAVENOUS | Status: DC | PRN

## 2023-12-06 NOTE — Progress Notes (Signed)
 Hemodialysis Note:  Received patient in bed to unit. Alert and oriented. Informed consent singed and in chart.  Treatment initiated: 0800 Treatment completed: 1135  Access used: Right internal jugular catheter Access issues: None  Patient tolerated well. Transported back to room, alert without acute distress. Report given to patient's RN.  Total UF removed: 1 liter Medications given: Retacrit  4000 units IV, Vancomycin  1.5 gm IV  Post HD weight: unable to take weight due to weakness  [facility]

## 2023-12-06 NOTE — Progress Notes (Signed)
 Notified Dr. Kandis that pt has been without BM (although barely eating) for 7 days. Miralax  and Lactulose  ordered by him.

## 2023-12-06 NOTE — Progress Notes (Signed)
 PT Cancellation Note  Patient Details Name: Mckenzie Lawrence MRN: [id number] DOB: Sep 21, 1945   Cancelled Treatment:     Pt politely declined PT session due to having dialysis earlier and currently fatigued. Pt remains slightly febrile, nursing states possible line infection. Will continue to follow acutely and progress as able.    Darice JAYSON Bohr 12/06/2023, 3:39 PM

## 2023-12-06 NOTE — Plan of Care (Signed)
  Problem: Education: Goal: Ability to describe self-care measures that may prevent or decrease complications (Diabetes Survival Skills Education) will improve Outcome: Progressing   Problem: Coping: Goal: Ability to adjust to condition or change in health will improve Outcome: Progressing   Problem: Fluid Volume: Goal: Ability to maintain a balanced intake and output will improve Outcome: Progressing   Problem: Health Behavior/Discharge Planning: Goal: Ability to identify and utilize available resources and services will improve Outcome: Progressing   Problem: Health Behavior/Discharge Planning: Goal: Ability to manage health-related needs will improve Outcome: Progressing   Problem: Metabolic: Goal: Ability to maintain appropriate glucose levels will improve Outcome: Progressing   Problem: Skin Integrity: Goal: Risk for impaired skin integrity will decrease Outcome: Progressing   Problem: Tissue Perfusion: Goal: Adequacy of tissue perfusion will improve Outcome: Progressing   Problem: Clinical Measurements: Goal: Ability to maintain clinical measurements within normal limits will improve Outcome: Progressing   Problem: Clinical Measurements: Goal: Will remain free from infection Outcome: Progressing   Problem: Clinical Measurements: Goal: Diagnostic test results will improve Outcome: Progressing   Problem: Clinical Measurements: Goal: Respiratory complications will improve Outcome: Progressing   Problem: Safety: Goal: Ability to remain free from injury will improve Outcome: Progressing   Problem: Skin Integrity: Goal: Risk for impaired skin integrity will decrease Outcome: Progressing   Problem: Cardiac: Goal: Ability to achieve and maintain adequate cardiopulmonary perfusion will improve Outcome: Progressing   Problem: Respiratory: Goal: Ability to maintain a clear airway will improve Outcome: Progressing   Problem: Respiratory: Goal: Levels of  oxygenation will improve Outcome: Progressing   Problem: Clinical Measurements: Goal: Complications related to the disease process, condition or treatment will be avoided or minimized Outcome: Progressing    Plan of care, assessment, monitoring, treatment, and intervention (s) ongoing, see MAR see flowsheet

## 2023-12-06 NOTE — Progress Notes (Signed)
 Patient with ESRD now on HD via a right internal jugular Permacath placed on 12/01/2023, now with MRSA bacteremia. The Permacath requires removal. Extensive discussions with other consultants and family. Will remove catheter today. Line holiday. Will place a new catheter either temporary or tunneled based upon cultures when HD needed.

## 2023-12-06 NOTE — Progress Notes (Signed)
 Pt without BM for 7 days (at least). Dr. Kandis notified and stool softeners ordered. POA said that her normal bowel regimen is to go every 7-10 days so pt declined any stool softeners at this juncture. Dr. Kandis notified.

## 2023-12-06 NOTE — Procedures (Signed)
 Operative Note     Preoperative diagnosis:   1. ESRD with sepsis/bacteremia  Postoperative diagnosis:  1. ESRD with sepsis/bacteremia  Procedure:  Removal of RIGHT IJ Permcath  Surgeon:  Curry Beat, MD  Anesthesia:  Local  EBL:  Minimal  Indication for the Procedure:  The patient has a bacteremia and the tunneled permacath is a potential source. The catheter will be removed and sent for culture. Risks and benefits are discussed and informed consent is obtained.  Description of the Procedure:  The patient's RIGHT neck, chest and existing catheter were sterilely prepped and draped. The area around the catheter was anesthetized copiously with 1% lidocaine . The catheter was dissected out with curved hemostats until the cuff was freed from the surrounding fibrous sheath. The fiber sheath was transected, and the catheter was then removed in its entirety using gentle traction. Pressure was held and sterile dressings were placed. The patient tolerated the procedure well. The tip was sent for culture.    Lakesia Dahle A  12/06/2023, 2:55 PM This note was created with Dragon Medical transcription system. Any errors in dictation are purely unintentional.

## 2023-12-06 NOTE — Progress Notes (Addendum)
 \ PROGRESS NOTE   HPI was taken from Dr. Cleatus: Mckenzie Lawrence is a 78 y.o. female with medical history significant for COPD, CVA, stage 4 CKD, SIADH, esophageal dysphagia treated with Botox  (last in 05/2023), DM, HLD, and HTN, last hospitalized from 2/1 to 07/26/2023 with MRSA bacteremia/sepsis/multifocal pneumonia, now presenting by EMS with shortness of breath and chest pressure.  Daughter at bedside gives history and states that her mother was in her usual state of health until earlier in the day on 6/12 when she developed shortness of breath while getting an MRI on her back.  His symptoms seem to improve however on the morning of 6/13 she awoke at 2 AM gasping for breath and called EMS.  She states patient's blood pressure had been running high lately due to ongoing back pain and she had also complained of swelling in her legs.  She has no orthopnea and sleeps on 1 pillow.  She has a chronic congested cough that has not changed and has no fever or chills  BP on arrival 202/74 and tachypneic to 25 saturating at 98% on room air.  Afebrile and pulse in the 70s.  Labs notable for WBC 11,000 with hemoglobin of 9 which is her baseline. Troponin 19 and BNP 2450 Creatinine 3.59 up from baseline of 2.74 with bicarb 19.  Glucose 207, potassium 5.2, sodium 123 (137 four months ago) EKG with sinus rhythm at 79 Chest x-ray showing small right pleural effusion and vascular congestion Bilateral lower extremity venous ultrasound ordered-result pending. Patient treated with DuoNeb and Solu-Medrol  and given a dose of Lasix  Hospitalist consulted for admission for COPD exacerbation and possible new onset CHF.      Mckenzie Lawrence  FMW:[id number] DOB: March 22, 1946 DOA: 11/28/2023 PCP: [name], MD   Assessment & Plan:   Principal Problem:   Hypertensive urgency/emergency Active Problems:   COPD with acute exacerbation (HCC)   Acute dyspnea   CAD (coronary artery disease)   Chest pain   Acute renal  failure superimposed on stage 4 chronic kidney disease (HCC)   Hyponatremia   Hypertensive emergency   Encounter for dialysis catheter care [facility])  Assessment and Plan:  MRSA bacteremia: fever on 6/19, blood cultures are positive. Hx mrsa bacteremia earlier this year 2/2 infected spinal hardware. Per daughter no overt signs infection on mri obtained earlier this month. Has dialysis catheter. Nephrology, vascular, and ID are involved. Vanc is running. TTE ordered. Will likely need cath removed. Will also speak w/ radiology about imaging the back  Hypertensive urgency/emergency: resolved emergency still somewhat hypertensive Continue on metoprolol , losartan , amlodipine . Will increase clonidine  dose today. IV hydralazine  prn   Acute diastolic CHF: w/ elevated BNP, CXR showing vascular congestion, pitting LE edema & dyspnea. Monitor I/Os. Continue on metoprolol , losartan . Fluid/volume management w/ HD. Echo shows EF 50-55%, grade II diastolic dysfunction, no regional wall motion abnormalities. Cardio following and recs apprec    COPD exacerbation: tessalon , bronchodilators & encourage incentive spirometry. Off steroids 6/18, appears resolved  Chest pain: w/ hx of CAD. Troponins are minimally elevated. EKG shows no ischemic changes. Continue on losartan , metoprolol , statin, aspirin . No CP currently   ESRD progressing from [facility]: C /p tunneled HD cath placed. Started on HD 12/02/23. Will need outpatient HD spot prior to d/c.  Nephro following and recs apprec. Dialysis today, on tts schedule. Has outpatient chair  Anxiety: severity unknown. Valium  discontinued 6/19  Sedation: resolved  Hyperkalemia: will managed w/ HD    Low back pain:  see above   Hyponatremia: w/ hx of SIADH. Labile. Will be managed w/ HD. Nephro following and recs apprec   ACD: likely secondary to CKD. H&H are stable  Acute diarrhea: x 3 days. Resolved as per pt    Esophageal dysphagia: s/p botox  via EGD in 08/2022.  Aspiration precautions. Continue w/ supportive care  Decreased PO: daughter concerned about intake, will request formal RD evaluation   OSA: CPAP qhs  DM2: well controlled at baseline, HbA1c 6.1. hypoglycemic today off steroids, will reduce insulin   Insomnia: decrease zolpidem  as above  Debility: PT rec is now snf, toc consulted  Constipation: start miralax  standing, one-time lactulose      DVT prophylaxis: heparin  Code Status: dnr  Family Communication: daughter at bedside 6/21 Disposition Plan: likely snf  Level of care: Progressive  Status is: Inpatient Remains inpatient appropriate because: severity of illness   Consultants:  Nephro   Procedures:   Antimicrobials:   Subjective: Feeling somewhat   Objective: Vitals:   12/06/23 1130 12/06/23 1135 12/06/23 1203 12/06/23 1221  BP: (!) 154/66 (!) 149/63 (!) 175/60   Pulse: 82 84 91   Resp: (!) 21 (!) 21    Temp:  99.4 F (37.4 C)    TempSrc:  Oral    SpO2: 95% 92% 90% 94%  Weight:      Height:        Intake/Output Summary (Last 24 hours) at 12/06/2023 1401 Last data filed at 12/06/2023 1135 Gross per 24 hour  Intake --  Output 1000 ml  Net -1000 ml    Filed Weights   12/04/23 0753 12/05/23 0500 12/06/23 0500  Weight: 74.7 kg 75.1 kg 71.9 kg    Examination:  General exam: appears chronically ill, NAD Respiratory system: diminished breath sounds b/l, no wheeze Cardiovascular system: S1/S2+. No rubs or gallops  Gastrointestinal system: abd is soft, NT  Central nervous system: alert & oriented. Moving all 4 Psychiatry: judgement and insight appears at baseline. Flat mood and affect    Data Reviewed: I have personally reviewed following labs and imaging studies  CBC: Recent Labs  Lab 12/01/23 0416 12/02/23 0430 12/03/23 0352 12/04/23 0508 12/06/23 0750  WBC 14.5* 10.3 12.1* 12.1* 15.6*  HGB 7.4* 8.2* 7.8* 8.6* 8.2*  HCT 22.6* 24.2* 23.6* 27.7* 25.2*  MCV 91.5 91.0 90.1 93.9 94.0  PLT  236 229 222 228 193   Basic Metabolic Panel: Recent Labs  Lab 12/01/23 0416 12/02/23 0430 12/03/23 0352 12/04/23 0508 12/06/23 0750  NA 127* 127* 130* 131* 130*  K 5.3* 5.7* 4.7 4.2 3.9  CL 97* 96* 98 97* 93*  CO2 20* 20* 24 26 26   GLUCOSE 237* 331* 224* 116* 182*  BUN 82* 87* 61* 38* 32*  CREATININE 4.02* 4.22* 3.19* 2.59* 3.03*  CALCIUM  8.4* 8.3* 8.1* 8.0* 7.9*  PHOS  --   --   --   --  4.0   GFR: Estimated Creatinine Clearance: 15.4 mL/min (A) (by C-G formula based on SCr of 3.03 mg/dL (H)). Liver Function Tests: Recent Labs  Lab 12/06/23 0750  ALBUMIN 2.0*   No results for input(s): LIPASE, AMYLASE in the last 168 hours. No results for input(s): AMMONIA in the last 168 hours. Coagulation Profile: No results for input(s): INR, PROTIME in the last 168 hours. Cardiac Enzymes: No results for input(s): CKTOTAL, CKMB, CKMBINDEX, TROPONINI in the last 168 hours. BNP (last 3 results) No results for input(s): PROBNP in the last 8760 hours. HbA1C: No results for input(s): HGBA1C  in the last 72 hours. CBG: Recent Labs  Lab 12/05/23 1640 12/05/23 2116 12/06/23 0659 12/06/23 0728 12/06/23 1229  GLUCAP 264* 191* 190* 173* 141*   Lipid Profile: No results for input(s): CHOL, HDL, LDLCALC, TRIG, CHOLHDL, LDLDIRECT in the last 72 hours. Thyroid Function Tests: No results for input(s): TSH, T4TOTAL, FREET4, T3FREE, THYROIDAB in the last 72 hours. Anemia Panel: No results for input(s): VITAMINB12, FOLATE, FERRITIN, TIBC, IRON, RETICCTPCT in the last 72 hours. Sepsis Labs: No results for input(s): PROCALCITON, LATICACIDVEN in the last 168 hours.  Recent Results (from the past 240 hours)  Culture, blood (Routine X 2) w Reflex to ID Panel     Status: None (Preliminary result)   Collection Time: 12/04/23  6:34 PM   Specimen: BLOOD  Result Value Ref Range Status   Specimen Description BLOOD BLOOD RIGHT ARM  Final    Special Requests   Final    BOTTLES DRAWN AEROBIC AND ANAEROBIC Blood Culture adequate volume   Culture  Setup Time   Final    Organism ID to follow IN BOTH AEROBIC AND ANAEROBIC BOTTLES GRAM POSITIVE COCCI CRITICAL RESULT CALLED TO, READ BACK BY AND VERIFIED WITH: JASON ROBBINS 12/06/2023 AT 9351 SRR Performed at [facility], [address], [place], [place] [address]    Culture [facility] POSITIVE COCCI  Final   Report Status PENDING  Incomplete  Culture, blood (Routine X 2) w Reflex to ID Panel     Status: None (Preliminary result)   Collection Time: 12/04/23  6:34 PM   Specimen: BLOOD  Result Value Ref Range Status   Specimen Description BLOOD BLOOD LEFT ARM  Final   Special Requests   Final    BOTTLES DRAWN AEROBIC AND ANAEROBIC Blood Culture adequate volume   Culture  Setup Time   Final    GRAM POSITIVE COCCI IN BOTH AEROBIC AND ANAEROBIC BOTTLES CRITICAL VALUE NOTED.  VALUE IS CONSISTENT WITH PREVIOUSLY REPORTED AND CALLED VALUE. Performed at [facility], [address] [place], [place] [address]    Culture [facility] POSITIVE COCCI  Final   Report Status PENDING  Incomplete  Blood Culture ID Panel (Reflexed)     Status: Abnormal   Collection Time: 12/04/23  6:34 PM  Result Value Ref Range Status   Enterococcus faecalis NOT DETECTED NOT DETECTED Final   Enterococcus Faecium NOT DETECTED NOT DETECTED Final   Listeria monocytogenes NOT DETECTED NOT DETECTED Final   Staphylococcus species DETECTED (A) NOT DETECTED Final    Comment: CRITICAL RESULT CALLED TO, READ BACK BY AND VERIFIED WITH: JASON ROBBINS 12/06/2023 AT 0648 SRR    Staphylococcus aureus (BCID) DETECTED (A) NOT DETECTED Final    Comment: Methicillin (oxacillin)-resistant Staphylococcus aureus (MRSA). MRSA is predictably resistant to beta-lactam antibiotics (except ceftaroline). Preferred therapy is vancomycin  unless clinically contraindicated. Patient requires contact precautions if   hospitalized. CRITICAL RESULT CALLED TO, READ BACK BY AND VERIFIED WITH: JASON ROBBINS 12/06/2023 AT 0648 SRR    Staphylococcus epidermidis NOT DETECTED NOT DETECTED Final   Staphylococcus lugdunensis NOT DETECTED NOT DETECTED Final   Streptococcus species NOT DETECTED NOT DETECTED Final   Streptococcus agalactiae NOT DETECTED NOT DETECTED Final   Streptococcus pneumoniae NOT DETECTED NOT DETECTED Final   Streptococcus pyogenes NOT DETECTED NOT DETECTED Final   A.calcoaceticus-baumannii NOT DETECTED NOT DETECTED Final   Bacteroides fragilis NOT DETECTED NOT DETECTED Final   Enterobacterales NOT DETECTED NOT DETECTED Final   Enterobacter cloacae complex NOT DETECTED NOT DETECTED Final   Escherichia  coli NOT DETECTED NOT DETECTED Final   Klebsiella aerogenes NOT DETECTED NOT DETECTED Final   Klebsiella oxytoca NOT DETECTED NOT DETECTED Final   Klebsiella pneumoniae NOT DETECTED NOT DETECTED Final   Proteus species NOT DETECTED NOT DETECTED Final   Salmonella species NOT DETECTED NOT DETECTED Final   Serratia marcescens NOT DETECTED NOT DETECTED Final   Haemophilus influenzae NOT DETECTED NOT DETECTED Final   Neisseria meningitidis NOT DETECTED NOT DETECTED Final   Pseudomonas aeruginosa NOT DETECTED NOT DETECTED Final   Stenotrophomonas maltophilia NOT DETECTED NOT DETECTED Final   Candida albicans NOT DETECTED NOT DETECTED Final   Candida auris NOT DETECTED NOT DETECTED Final   Candida glabrata NOT DETECTED NOT DETECTED Final   Candida krusei NOT DETECTED NOT DETECTED Final   Candida parapsilosis NOT DETECTED NOT DETECTED Final   Candida tropicalis NOT DETECTED NOT DETECTED Final   Cryptococcus neoformans/gattii NOT DETECTED NOT DETECTED Final   Meth resistant mecA/C and MREJ DETECTED (A) NOT DETECTED Final    Comment: CRITICAL RESULT CALLED TO, READ BACK BY AND VERIFIED WITH: JASON ROBBINS 12/06/2023 AT 9351 SRR Performed at [facility], [address]  [place], [place] [address]   SARS Coronavirus 2 by RT PCR (hospital order, performed in [facility] hospital lab) *cepheid single result test* Anterior Nasal Swab     Status: None   Collection Time: 12/04/23  8:40 PM   Specimen: Anterior Nasal Swab  Result Value Ref Range Status   SARS Coronavirus 2 by RT PCR NEGATIVE NEGATIVE Final    Comment: (NOTE) SARS-CoV-2 target nucleic acids are NOT DETECTED.  The SARS-CoV-2 RNA is generally detectable in upper and lower respiratory specimens during the acute phase of infection. The lowest concentration of SARS-CoV-2 viral copies this assay can detect is 250 copies / mL. A negative result does not preclude SARS-CoV-2 infection and should not be used as the sole basis for treatment or other patient management decisions.  A negative result may occur with improper specimen collection / handling, submission of specimen other than nasopharyngeal swab, presence of viral mutation(s) within the areas targeted by this assay, and inadequate number of viral copies (<250 copies / mL). A negative result must be combined with clinical observations, patient history, and epidemiological information.  Fact Sheet for Patients:   RoadLapTop.co.za  Fact Sheet for Healthcare Providers: http://kim-miller.com/  This test is not yet approved or  cleared by the [place]  FDA and has been authorized for detection and/or diagnosis of SARS-CoV-2 by FDA under an Emergency Use Authorization (EUA).  This EUA will remain in effect (meaning this test can be used) for the duration of the COVID-19 declaration under Section 564(b)(1) of the Act, 21 U.S.C. section 360bbb-3(b)(1), unless the authorization is terminated or revoked sooner.  Performed at [facility], [address] [place], [place] [address]   Urine Culture (for pregnant, neutropenic or urologic patients or patients with an indwelling urinary catheter)      Status: Abnormal   Collection Time: 12/04/23  8:45 PM   Specimen: Urine, Clean Catch  Result Value Ref Range Status   Specimen Description   Final    URINE, CLEAN CATCH Performed at [facility], [address], [place], [place] [address]    Special Requests   Final    NONE Performed at [facility], [address] [place], [place] [address]    Culture (A)  Final    <10,000 COLONIES/mL INSIGNIFICANT GROWTH Performed at [facility], [address] [place], [place] [address]    Report Status 12/06/2023 FINAL  Final  Respiratory (~20 pathogens) panel by PCR     Status: None   Collection Time: 12/04/23 11:30 PM   Specimen: Nasopharyngeal Swab; Respiratory  Result Value Ref Range Status   Adenovirus NOT DETECTED NOT DETECTED Final   Coronavirus 229E NOT DETECTED NOT DETECTED Final    Comment: (NOTE) The Coronavirus on the Respiratory Panel, DOES NOT test for the novel  Coronavirus (2019 nCoV)    Coronavirus HKU1 NOT DETECTED NOT DETECTED Final   Coronavirus NL63 NOT DETECTED NOT DETECTED Final   Coronavirus OC43 NOT DETECTED NOT DETECTED Final   Metapneumovirus NOT DETECTED NOT DETECTED Final   Rhinovirus / Enterovirus NOT DETECTED NOT DETECTED Final   Influenza A NOT DETECTED NOT DETECTED Final   Influenza B NOT DETECTED NOT DETECTED Final   Parainfluenza Virus 1 NOT DETECTED NOT DETECTED Final   Parainfluenza Virus 2 NOT DETECTED NOT DETECTED Final   Parainfluenza Virus 3 NOT DETECTED NOT DETECTED Final   Parainfluenza Virus 4 NOT DETECTED NOT DETECTED Final   Respiratory Syncytial Virus NOT DETECTED NOT DETECTED Final   Bordetella pertussis NOT DETECTED NOT DETECTED Final   Bordetella Parapertussis NOT DETECTED NOT DETECTED Final   Chlamydophila pneumoniae NOT DETECTED NOT DETECTED Final   Mycoplasma pneumoniae NOT DETECTED NOT DETECTED Final    Comment: Performed at [facility], [address] [place], [place] [address]          Radiology Studies: DG Chest Port 1 View Result Date: 12/04/2023 CLINICAL DATA:  Fever EXAM: PORTABLE CHEST 1 VIEW COMPARISON:  11/28/2023 FINDINGS: Right internal jugular hemodialysis catheter tips are seen within the superior vena cava. Lungs appear mildly hyperinflated, stable since prior examination, suggesting changes of underlying COPD. Small right pleural effusion again noted, slightly decreased since prior examination. Focal atelectasis or infiltrate within the right costophrenic angle. Mild diffuse interstitial thickening is again noted in keeping with interstitial pulmonary edema or airway inflammation. No pneumothorax. Stable cardiomegaly. No acute bone abnormality. IMPRESSION: 1. Stable cardiomegaly. 2. Small right pleural effusion, slightly decreased since prior examination. 3. Stable interstitial thickening in keeping with interstitial pulmonary edema or airway inflammation. Electronically Signed   By: Dorethia Molt M.D.   On: 12/04/2023 20:43         Scheduled Meds:  amLODipine   10 mg Oral Daily   aspirin  EC  81 mg Oral QHS   benzonatate   200 mg Oral TID   Chlorhexidine  Gluconate Cloth  6 each Topical Q0600   cloNIDine   0.1 mg Oral BID   epoetin  alfa-epbx (RETACRIT ) injection  4,000 Units Intravenous Q T,Th,Sat-1800   guaiFENesin   600 mg Oral BID   heparin  injection (subcutaneous)  5,000 Units Subcutaneous Q8H   insulin  aspart  0-5 Units Subcutaneous QHS   insulin  aspart  0-9 Units Subcutaneous TID WC   insulin  aspart  2 Units Subcutaneous TID WC   insulin  glargine-yfgn  8 Units Subcutaneous QHS   lactose free nutrition  237 mL Oral TID WC   lidocaine   10 mL Intradermal Once   losartan   100 mg Oral QHS   metoprolol  succinate  100 mg Oral QHS   multivitamin  1 tablet Oral QHS   nicotine   21 mg Transdermal Daily   rosuvastatin   5 mg Oral Daily   Continuous Infusions:  anticoagulant sodium citrate     vancomycin        LOS: 8 days  Devaughn KATHEE Ban,  MD [facility]  If 7PM-7AM, please contact night-coverage [email or web address] 12/06/2023, 2:01 PM

## 2023-12-06 NOTE — Progress Notes (Signed)
 [facility]  ROUNDING NOTE   Subjective:  [name] is a 78 y.o.  female with past medical history of hypertension, long-standing diabetes mellitus type 2, lower extremity edema, hyperlipidemia, tobacco abuse, prior history of CVA, COPD, obstructive sleep apnea, lumbar spinal fusion, left upper lobe lung mass who presents with volume overload and increasing shortness of breath.   Update: Unfortunately appears that the patient has MRSA bacteremia. This may be secondary to HD line which was recently placed. Patient seen and evaluated during fourth dialysis treatment. Tolerating well. Patient notified about MRSA bacteremia and need to remove PermCath.   Objective:  Vital signs in last 24 hours:  Temp:  [98.6 F (37 C)-99.5 F (37.5 C)] 98.7 F (37.1 C) (06/21 0749) Pulse Rate:  [78-90] 82 (06/21 0800) Resp:  [18-29] 24 (06/21 0800) BP: (129-169)/(42-62) 169/62 (06/21 0800) SpO2:  [90 %-100 %] 94 % (06/21 0800) Weight:  [71.9 kg] 71.9 kg (06/21 0500)  Weight change: -2.8 kg Filed Weights   12/04/23 0753 12/05/23 0500 12/06/23 0500  Weight: 74.7 kg 75.1 kg 71.9 kg    Intake/Output: I/O last 3 completed shifts: In: 100 [IV Piggyback:100] Out: -    Intake/Output this shift:  No intake/output data recorded.  Physical Exam: General: No acute distress  Head: Normocephalic, atraumatic. Dry mucosal membranes  Eyes: Anicteric  Neck: Supple  Lungs:  Normal breathing effort  Heart: Regular rate and rhythm  Abdomen:  Soft, nontender, BS present  Extremities: No peripheral edema.  Neurologic: Nonfocal, moving all four extremities  Skin: No lesions  Dialysis Access: Right IJ PermCath    Basic Metabolic Panel: Recent Labs  Lab 12/01/23 0416 12/02/23 0430 12/03/23 0352 12/04/23 0508 12/06/23 0750  NA 127* 127* 130* 131* 130*  K 5.3* 5.7* 4.7 4.2 3.9  CL 97* 96* 98 97* 93*  CO2 20* 20* 24 26 26   GLUCOSE 237* 331* 224* 116* 182*  BUN 82* 87* 61* 38*  32*  CREATININE 4.02* 4.22* 3.19* 2.59* 3.03*  CALCIUM  8.4* 8.3* 8.1* 8.0* 7.9*  PHOS  --   --   --   --  4.0    Liver Function Tests: Recent Labs  Lab 12/06/23 0750  ALBUMIN 2.0*   No results for input(s): LIPASE, AMYLASE in the last 168 hours. No results for input(s): AMMONIA in the last 168 hours.  CBC: Recent Labs  Lab 12/01/23 0416 12/02/23 0430 12/03/23 0352 12/04/23 0508 12/06/23 0750  WBC 14.5* 10.3 12.1* 12.1* 15.6*  HGB 7.4* 8.2* 7.8* 8.6* 8.2*  HCT 22.6* 24.2* 23.6* 27.7* 25.2*  MCV 91.5 91.0 90.1 93.9 94.0  PLT 236 229 222 228 193    Cardiac Enzymes: No results for input(s): CKTOTAL, CKMB, CKMBINDEX, TROPONINI in the last 168 hours.  BNP: Invalid input(s): POCBNP  CBG: Recent Labs  Lab 12/05/23 1130 12/05/23 1640 12/05/23 2116 12/06/23 0659 12/06/23 0728  GLUCAP 188* 264* 191* 190* 173*    Microbiology: Results for orders placed or performed during the hospital encounter of 11/28/23  Culture, blood (Routine X 2) w Reflex to ID Panel     Status: None (Preliminary result)   Collection Time: 12/04/23  6:34 PM   Specimen: BLOOD  Result Value Ref Range Status   Specimen Description BLOOD BLOOD RIGHT ARM  Final   Special Requests   Final    BOTTLES DRAWN AEROBIC AND ANAEROBIC Blood Culture adequate volume   Culture  Setup Time   Final    Organism ID to  follow IN BOTH AEROBIC AND ANAEROBIC BOTTLES GRAM POSITIVE COCCI CRITICAL RESULT CALLED TO, READ BACK BY AND VERIFIED WITH: SELINDA SIMPERS 12/06/2023 AT 9351 SRR Performed at [facility], [address] [place], [place] [address]    Culture [facility] POSITIVE COCCI  Final   Report Status PENDING  Incomplete  Culture, blood (Routine X 2) w Reflex to ID Panel     Status: None (Preliminary result)   Collection Time: 12/04/23  6:34 PM   Specimen: BLOOD  Result Value Ref Range Status   Specimen Description BLOOD BLOOD LEFT ARM  Final   Special Requests   Final    BOTTLES  DRAWN AEROBIC AND ANAEROBIC Blood Culture adequate volume   Culture  Setup Time   Final    GRAM POSITIVE COCCI ANAEROBIC BOTTLE ONLY CRITICAL VALUE NOTED.  VALUE IS CONSISTENT WITH PREVIOUSLY REPORTED AND CALLED VALUE. Performed at [facility], [address] [place], [place] [address]    Culture [facility] POSITIVE COCCI  Final   Report Status PENDING  Incomplete  Blood Culture ID Panel (Reflexed)     Status: Abnormal   Collection Time: 12/04/23  6:34 PM  Result Value Ref Range Status   Enterococcus faecalis NOT DETECTED NOT DETECTED Final   Enterococcus Faecium NOT DETECTED NOT DETECTED Final   Listeria monocytogenes NOT DETECTED NOT DETECTED Final   Staphylococcus species DETECTED (A) NOT DETECTED Final    Comment: CRITICAL RESULT CALLED TO, READ BACK BY AND VERIFIED WITH: JASON ROBBINS 12/06/2023 AT 0648 SRR    Staphylococcus aureus (BCID) DETECTED (A) NOT DETECTED Final    Comment: Methicillin (oxacillin)-resistant Staphylococcus aureus (MRSA). MRSA is predictably resistant to beta-lactam antibiotics (except ceftaroline). Preferred therapy is vancomycin  unless clinically contraindicated. Patient requires contact precautions if  hospitalized. CRITICAL RESULT CALLED TO, READ BACK BY AND VERIFIED WITH: JASON ROBBINS 12/06/2023 AT 0648 SRR    Staphylococcus epidermidis NOT DETECTED NOT DETECTED Final   Staphylococcus lugdunensis NOT DETECTED NOT DETECTED Final   Streptococcus species NOT DETECTED NOT DETECTED Final   Streptococcus agalactiae NOT DETECTED NOT DETECTED Final   Streptococcus pneumoniae NOT DETECTED NOT DETECTED Final   Streptococcus pyogenes NOT DETECTED NOT DETECTED Final   A.calcoaceticus-baumannii NOT DETECTED NOT DETECTED Final   Bacteroides fragilis NOT DETECTED NOT DETECTED Final   Enterobacterales NOT DETECTED NOT DETECTED Final   Enterobacter cloacae complex NOT DETECTED NOT DETECTED Final   Escherichia coli NOT DETECTED NOT DETECTED Final   Klebsiella  aerogenes NOT DETECTED NOT DETECTED Final   Klebsiella oxytoca NOT DETECTED NOT DETECTED Final   Klebsiella pneumoniae NOT DETECTED NOT DETECTED Final   Proteus species NOT DETECTED NOT DETECTED Final   Salmonella species NOT DETECTED NOT DETECTED Final   Serratia marcescens NOT DETECTED NOT DETECTED Final   Haemophilus influenzae NOT DETECTED NOT DETECTED Final   Neisseria meningitidis NOT DETECTED NOT DETECTED Final   Pseudomonas aeruginosa NOT DETECTED NOT DETECTED Final   Stenotrophomonas maltophilia NOT DETECTED NOT DETECTED Final   Candida albicans NOT DETECTED NOT DETECTED Final   Candida auris NOT DETECTED NOT DETECTED Final   Candida glabrata NOT DETECTED NOT DETECTED Final   Candida krusei NOT DETECTED NOT DETECTED Final   Candida parapsilosis NOT DETECTED NOT DETECTED Final   Candida tropicalis NOT DETECTED NOT DETECTED Final   Cryptococcus neoformans/gattii NOT DETECTED NOT DETECTED Final   Meth resistant mecA/C and MREJ DETECTED (A) NOT DETECTED Final    Comment: CRITICAL RESULT CALLED TO, READ BACK BY AND VERIFIED WITH:  JASON ROBBINS 12/06/2023 AT 9351 SRR Performed at [facility], [address] [place], [place] [address]   SARS Coronavirus 2 by RT PCR (hospital order, performed in [facility] hospital lab) *cepheid single result test* Anterior Nasal Swab     Status: None   Collection Time: 12/04/23  8:40 PM   Specimen: Anterior Nasal Swab  Result Value Ref Range Status   SARS Coronavirus 2 by RT PCR NEGATIVE NEGATIVE Final    Comment: (NOTE) SARS-CoV-2 target nucleic acids are NOT DETECTED.  The SARS-CoV-2 RNA is generally detectable in upper and lower respiratory specimens during the acute phase of infection. The lowest concentration of SARS-CoV-2 viral copies this assay can detect is 250 copies / mL. A negative result does not preclude SARS-CoV-2 infection and should not be used as the sole basis for treatment or other patient management decisions.  A  negative result may occur with improper specimen collection / handling, submission of specimen other than nasopharyngeal swab, presence of viral mutation(s) within the areas targeted by this assay, and inadequate number of viral copies (<250 copies / mL). A negative result must be combined with clinical observations, patient history, and epidemiological information.  Fact Sheet for Patients:   RoadLapTop.co.za  Fact Sheet for Healthcare Providers: http://kim-miller.com/  This test is not yet approved or  cleared by the [place]  FDA and has been authorized for detection and/or diagnosis of SARS-CoV-2 by FDA under an Emergency Use Authorization (EUA).  This EUA will remain in effect (meaning this test can be used) for the duration of the COVID-19 declaration under Section 564(b)(1) of the Act, 21 U.S.C. section 360bbb-3(b)(1), unless the authorization is terminated or revoked sooner.  Performed at [facility], [address] [place], [place] [address]   Respiratory (~20 pathogens) panel by PCR     Status: None   Collection Time: 12/04/23 11:30 PM   Specimen: Nasopharyngeal Swab; Respiratory  Result Value Ref Range Status   Adenovirus NOT DETECTED NOT DETECTED Final   Coronavirus 229E NOT DETECTED NOT DETECTED Final    Comment: (NOTE) The Coronavirus on the Respiratory Panel, DOES NOT test for the novel  Coronavirus (2019 nCoV)    Coronavirus HKU1 NOT DETECTED NOT DETECTED Final   Coronavirus NL63 NOT DETECTED NOT DETECTED Final   Coronavirus OC43 NOT DETECTED NOT DETECTED Final   Metapneumovirus NOT DETECTED NOT DETECTED Final   Rhinovirus / Enterovirus NOT DETECTED NOT DETECTED Final   Influenza A NOT DETECTED NOT DETECTED Final   Influenza B NOT DETECTED NOT DETECTED Final   Parainfluenza Virus 1 NOT DETECTED NOT DETECTED Final   Parainfluenza Virus 2 NOT DETECTED NOT DETECTED Final   Parainfluenza Virus 3 NOT  DETECTED NOT DETECTED Final   Parainfluenza Virus 4 NOT DETECTED NOT DETECTED Final   Respiratory Syncytial Virus NOT DETECTED NOT DETECTED Final   Bordetella pertussis NOT DETECTED NOT DETECTED Final   Bordetella Parapertussis NOT DETECTED NOT DETECTED Final   Chlamydophila pneumoniae NOT DETECTED NOT DETECTED Final   Mycoplasma pneumoniae NOT DETECTED NOT DETECTED Final    Comment: Performed at [facility], [address] [place], [place] [address]    Coagulation Studies: No results for input(s): LABPROT, INR in the last 72 hours.  Urinalysis: Recent Labs    12/04/23 2045  COLORURINE YELLOW*  LABSPEC 1.019  PHURINE 6.0  GLUCOSEU 150*  HGBUR NEGATIVE  BILIRUBINUR NEGATIVE  KETONESUR NEGATIVE  PROTEINUR >=300*  NITRITE NEGATIVE  LEUKOCYTESUR NEGATIVE  Imaging: DG Chest Port 1 View Result Date: 12/04/2023 CLINICAL DATA:  Fever EXAM: PORTABLE CHEST 1 VIEW COMPARISON:  11/28/2023 FINDINGS: Right internal jugular hemodialysis catheter tips are seen within the superior vena cava. Lungs appear mildly hyperinflated, stable since prior examination, suggesting changes of underlying COPD. Small right pleural effusion again noted, slightly decreased since prior examination. Focal atelectasis or infiltrate within the right costophrenic angle. Mild diffuse interstitial thickening is again noted in keeping with interstitial pulmonary edema or airway inflammation. No pneumothorax. Stable cardiomegaly. No acute bone abnormality. IMPRESSION: 1. Stable cardiomegaly. 2. Small right pleural effusion, slightly decreased since prior examination. 3. Stable interstitial thickening in keeping with interstitial pulmonary edema or airway inflammation. Electronically Signed   By: Dorethia Molt M.D.   On: 12/04/2023 20:43      Medications:    anticoagulant sodium citrate     vancomycin       amLODipine   10 mg Oral Daily   aspirin  EC  81 mg Oral QHS   benzonatate   200 mg Oral TID    Chlorhexidine  Gluconate Cloth  6 each Topical Q0600   cloNIDine   0.1 mg Oral BID   epoetin  alfa-epbx (RETACRIT ) injection  4,000 Units Intravenous Q M,W,F-1800   guaiFENesin   600 mg Oral BID   heparin  injection (subcutaneous)  5,000 Units Subcutaneous Q8H   insulin  aspart  0-5 Units Subcutaneous QHS   insulin  aspart  0-9 Units Subcutaneous TID WC   insulin  aspart  2 Units Subcutaneous TID WC   insulin  glargine-yfgn  8 Units Subcutaneous QHS   lactose free nutrition  237 mL Oral TID WC   losartan   100 mg Oral QHS   metoprolol  succinate  100 mg Oral QHS   multivitamin  1 tablet Oral QHS   nicotine   21 mg Transdermal Daily   rosuvastatin   5 mg Oral Daily   vancomycin  variable dose per unstable renal function (pharmacist dosing)   Does not apply See admin instructions   acetaminophen  **OR** acetaminophen , albuterol , anticoagulant sodium citrate, guaiFENesin -dextromethorphan , heparin , hydrALAZINE , ondansetron  **OR** ondansetron  (ZOFRAN ) IV, oxyCODONE -acetaminophen , sucralfate , zolpidem   Assessment/ Plan:  Ms. MARIAPAULA KRIST is a 78 y.o.  female  with past medical history of hypertension, long-standing diabetes mellitus type 2, lower extremity edema, hyperlipidemia, tobacco abuse, prior history of CVA, COPD, obstructive sleep apnea, lumbar spinal fusion, left upper lobe lung mass who presents with volume overload and increasing shortness of breath.   1.  Acute kidney injury/chronic kidney disease stage IV/diabetes mellitus type 2 with chronic kidney disease/proteinuria/MRSA bacteremia.  eGFR down to 11.  Baseline creatinine 2.7 in February 2025.  Creatinine increased to 3.6-4 in June 2025. - PermCath has been placed  (12/01/2023) - Patient seen during dialysis treatment today and tolerating treatment well.  And found to be tolerating well -MRSA bacteremia noted.  Remove PermCath today. -Outpatient dialysis seat at [facility] [address] has been secured, first treatment 12/09/2023.  Patient to arrive at  11:30 AM.  Lab Results  Component Value Date   CREATININE 3.03 (H) 12/06/2023   CREATININE 2.59 (H) 12/04/2023   CREATININE 3.19 (H) 12/03/2023     Intake/Output Summary (Last 24 hours) at 12/06/2023 0822 Last data filed at 12/05/2023 1038 Gross per 24 hour  Intake 0 ml  Output --  Net 0 ml       2.  Hyponatremia.  Differential includes underlying emphysema, lung nodules, AKI Serum sodium has stabilized at 130.  3.  Hyperkalemia Potassium acceptable at 3.9   4.  HTN -  2D echo from 11/29/2023 shows LVEF 55 to 60%, grade 2 diastolic dysfunction, trivial mitral regurgitation Continue amlodipine , losartan , metoprolol , and clonidine .   5.  Anemia of chronic kidney disease.  Hemoglobin 7.9 Patient has  left upper lobe mass concerning for malignancy.  Discussed with Dr. Lamon.  Okay to use ESA.  Consider transfusion for HgB<7     LOS: 8 Mardee Clune 6/21/20258:22 AM

## 2023-12-06 NOTE — Progress Notes (Signed)
 PHARMACY - PHYSICIAN COMMUNICATION CRITICAL VALUE ALERT - BLOOD CULTURE IDENTIFICATION (BCID)  Mckenzie Lawrence is an 78 y.o. female who presented to [facility] on 11/28/2023 with a chief complaint of HTN urgency, AKI.   Assessment:  MRSA in 1 of 4 bottles (include suspected source if known)  Name of physician (or Provider) Contacted: Erminio Cone, NP   Current antibiotics: none   Changes to prescribed antibiotics recommended:  Recommendations accepted by provider - Will start Vancomycin  , dosing by levels d/t unstable renal function   Results for orders placed or performed during the hospital encounter of 07/19/23  Blood Culture ID Panel (Reflexed) (Collected: 07/19/2023  8:40 PM)  Result Value Ref Range   Enterococcus faecalis NOT DETECTED NOT DETECTED   Enterococcus Faecium NOT DETECTED NOT DETECTED   Listeria monocytogenes NOT DETECTED NOT DETECTED   Staphylococcus species DETECTED (A) NOT DETECTED   Staphylococcus aureus (BCID) DETECTED (A) NOT DETECTED   Staphylococcus epidermidis NOT DETECTED NOT DETECTED   Staphylococcus lugdunensis NOT DETECTED NOT DETECTED   Streptococcus species NOT DETECTED NOT DETECTED   Streptococcus agalactiae NOT DETECTED NOT DETECTED   Streptococcus pneumoniae NOT DETECTED NOT DETECTED   Streptococcus pyogenes NOT DETECTED NOT DETECTED   A.calcoaceticus-baumannii NOT DETECTED NOT DETECTED   Bacteroides fragilis NOT DETECTED NOT DETECTED   Enterobacterales NOT DETECTED NOT DETECTED   Enterobacter cloacae complex NOT DETECTED NOT DETECTED   Escherichia coli NOT DETECTED NOT DETECTED   Klebsiella aerogenes NOT DETECTED NOT DETECTED   Klebsiella oxytoca NOT DETECTED NOT DETECTED   Klebsiella pneumoniae NOT DETECTED NOT DETECTED   Proteus species NOT DETECTED NOT DETECTED   Salmonella species NOT DETECTED NOT DETECTED   Serratia marcescens NOT DETECTED NOT DETECTED   Haemophilus influenzae NOT DETECTED NOT DETECTED   Neisseria meningitidis NOT  DETECTED NOT DETECTED   Pseudomonas aeruginosa NOT DETECTED NOT DETECTED   Stenotrophomonas maltophilia NOT DETECTED NOT DETECTED   Candida albicans NOT DETECTED NOT DETECTED   Candida auris NOT DETECTED NOT DETECTED   Candida glabrata NOT DETECTED NOT DETECTED   Candida krusei NOT DETECTED NOT DETECTED   Candida parapsilosis NOT DETECTED NOT DETECTED   Candida tropicalis NOT DETECTED NOT DETECTED   Cryptococcus neoformans/gattii NOT DETECTED NOT DETECTED   Meth resistant mecA/C and MREJ DETECTED (A) NOT DETECTED    [name] D 12/06/2023  6:56 AM

## 2023-12-06 NOTE — Progress Notes (Signed)
 Patient ID: Mckenzie Lawrence, female   DOB: [birth year], 78 y.o.   MRN: [id number] [facility]    SUBJECTIVE: Patient feels tired weak fatigue denies any chills currently receiving dialysis shortness of breath is somewhat improved no chest pain   Vitals:   12/06/23 0800 12/06/23 0830 12/06/23 0900 12/06/23 0930  BP: (!) 169/62 (!) 168/62 (!) 160/53 (!) 158/55  Pulse: 82 83 83 83  Resp: (!) 24 (!) 22 (!) 21 13  Temp:      TempSrc:      SpO2: 94% 96% 96% 95%  Weight:      Height:         Intake/Output Summary (Last 24 hours) at 12/06/2023 0955 Last data filed at 12/05/2023 1038 Gross per 24 hour  Intake 0 ml  Output --  Net 0 ml      PHYSICAL EXAM  General: Well developed, well nourished, in no acute distress HEENT:  Normocephalic and atramatic Neck:  No JVD.  Lungs: Clear bilaterally to auscultation and percussion. Heart: HRRR . Normal S1 and S2 without gallops or 2/6 sem murmurs.  Abdomen: Bowel sounds are positive, abdomen soft and non-tender  Msk:  Back normal, normal gait. Normal strength and tone for age. Extremities: No clubbing, cyanosis or edema.   Neuro: Alert and oriented X 3. Psych:  Good affect, responds appropriately   LABS: Basic Metabolic Panel: Recent Labs    12/04/23 0508 12/06/23 0750  NA 131* 130*  K 4.2 3.9  CL 97* 93*  CO2 26 26  GLUCOSE 116* 182*  BUN 38* 32*  CREATININE 2.59* 3.03*  CALCIUM  8.0* 7.9*  PHOS  --  4.0   Liver Function Tests: Recent Labs    12/06/23 0750  ALBUMIN 2.0*   No results for input(s): LIPASE, AMYLASE in the last 72 hours. CBC: Recent Labs    12/04/23 0508 12/06/23 0750  WBC 12.1* 15.6*  HGB 8.6* 8.2*  HCT 27.7* 25.2*  MCV 93.9 94.0  PLT 228 193   Cardiac Enzymes: No results for input(s): CKTOTAL, CKMB, CKMBINDEX, TROPONINI in the last 72 hours. BNP: Invalid input(s): POCBNP D-Dimer: No results for input(s): DDIMER in the last 72 hours. Hemoglobin A1C: No results for input(s):  HGBA1C in the last 72 hours. Fasting Lipid Panel: No results for input(s): CHOL, HDL, LDLCALC, TRIG, CHOLHDL, LDLDIRECT in the last 72 hours. Thyroid Function Tests: No results for input(s): TSH, T4TOTAL, T3FREE, THYROIDAB in the last 72 hours.  Invalid input(s): FREET3 Anemia Panel: No results for input(s): VITAMINB12, FOLATE, FERRITIN, TIBC, IRON, RETICCTPCT in the last 72 hours.  DG Chest Port 1 View Result Date: 12/04/2023 CLINICAL DATA:  Fever EXAM: PORTABLE CHEST 1 VIEW COMPARISON:  11/28/2023 FINDINGS: Right internal jugular hemodialysis catheter tips are seen within the superior vena cava. Lungs appear mildly hyperinflated, stable since prior examination, suggesting changes of underlying COPD. Small right pleural effusion again noted, slightly decreased since prior examination. Focal atelectasis or infiltrate within the right costophrenic angle. Mild diffuse interstitial thickening is again noted in keeping with interstitial pulmonary edema or airway inflammation. No pneumothorax. Stable cardiomegaly. No acute bone abnormality. IMPRESSION: 1. Stable cardiomegaly. 2. Small right pleural effusion, slightly decreased since prior examination. 3. Stable interstitial thickening in keeping with interstitial pulmonary edema or airway inflammation. Electronically Signed   By: Dorethia Molt M.D.   On: 12/04/2023 20:43     Echo preserved left ventricular function EF of at least 60%  TELEMETRY: Normal sinus rhythm rate of around 100  nonspecific ST-T wave changes:  ASSESSMENT AND PLAN:  Principal Problem:   Hypertensive urgency/emergency Active Problems:   Hyponatremia   CAD (coronary artery disease)   COPD with acute exacerbation (HCC)   Acute renal failure superimposed on stage 4 chronic kidney disease (HCC)   Chest pain   Hypertensive emergency   Acute dyspnea   Encounter for dialysis catheter care [facility])    Plan Acute on chronic diastolic  congestive heart failure continue current therapy fluid management with dialysis MRSA bacteremia will recommend broad-spectrum antibiotic therapy consider TEE to rule out endocarditis Chronic difficult to control hypertension continue antihypertensive medications including adding clonidine  as necessary COPD at least moderate continue current therapy inhalers as necessary supplemental oxygen as necessary Obstructive sleep apnea CPAP as indicated Chest pain intermittent recurrent continue conservative management   Cara JONETTA Lovelace, MD 12/06/2023 9:55 AM

## 2023-12-06 NOTE — Progress Notes (Signed)
 Brief ID note  78 YF with hs of MRSA bacteremia , spinal HW,   COPD, CKD, CVA, SIADH, esohpageal dysphagia, DM admitted with HTN urgency and acute on chronic HF she was started onHD via tunneled cath.  #Hospital associated MRSA bacteremia likely  2/2 HD line -6/19 blood Cx+ 2/2 set MRSA -HD cath placed on 6/16 Plan: Start vancomycin  TTE no veg, will need tee -Repeat blood Cx tomorrow to ensure clearance -Remove HD cath, and replace PIV -Official ID consult on Monday

## 2023-12-06 NOTE — Progress Notes (Signed)
 Pharmacy Antibiotic Note  Mckenzie Lawrence is a 78 y.o. female admitted on 11/28/2023 with hypertensive urgency/emergency, possible new onset CHF, and acute renal failure on CKD. Patient has PMH of DM, lumbar spinal fusion, HLD, and CKD. Patient had PermCath placed on 6/16 and was started on HD. Blood cultures from 6/19 showing MRSA Bactermia.   Pharmacy has been consulted for Vancomycin  dosing.  Plan: Vancomycin  1500 mg IV X 1 ordered for 6/21 @ ~ 0700. - Will plan for vancomycin  750mg  IV w/HD (f patient tolerates full HD session)  -Plan for Vanc level before 4th HD session - Goal Vanc Level : 15 - 25 mcg/Ml  Height: 5' 7 (170.2 cm) Weight: 71.9 kg (158 lb 8.2 oz) IBW/kg (Calculated) : 61.6  Temp (24hrs), Avg:99.1 F (37.3 C), Min:98.6 F (37 C), Max:99.5 F (37.5 C)  Recent Labs  Lab 11/30/23 0235 12/01/23 0416 12/02/23 0430 12/03/23 0352 12/04/23 0508 12/06/23 0750  WBC 14.8* 14.5* 10.3 12.1* 12.1* 15.6*  CREATININE 4.09* 4.02* 4.22* 3.19* 2.59*  --     Estimated Creatinine Clearance: 18 mL/min (A) (by C-G formula based on SCr of 2.59 mg/dL (H)).    Allergies  Allergen Reactions   Iodine Anaphylaxis and Other (See Comments)    Pt states that she is allergic to ingested iodine only, okay for betadine.     Iodine I-131 Tositumomab Anaphylaxis   Shellfish Allergy Anaphylaxis   Codeine Nausea And Vomiting   Morphine  Sulfate Nausea And Vomiting   Irbesartan Other (See Comments)     Unknown  (Avapro)   Sulfa Antibiotics Other (See Comments)    Fever     Antimicrobials this admission:  6/21 Vancomycin   >>   Dose adjustments this admission:   Microbiology results: 6/19  BCx: MRSA   Thank you for allowing pharmacy to be a part of this patient's care.  Estill CHRISTELLA Lutes, PharmD, BCPS Clinical Pharmacist 12/06/2023 8:23 AM

## 2023-12-06 NOTE — Progress Notes (Signed)
 Pharmacy Antibiotic Note  Mckenzie Lawrence is a 78 y.o. female admitted on 11/28/2023 with bacteremia. AKI.  Pharmacy has been consulted for Vancomycin  dosing.  Plan: Vancomycin  1500 mg IV X 1 ordered for 6/21 @ ~ 0700. - will dose by levels til renal function improves - draw first level in ~ 24 hrs on 6/22 @ 0700 - goal trough : 15 - 20   Height: 5' 7 (170.2 cm) Weight: 71.9 kg (158 lb 8.2 oz) IBW/kg (Calculated) : 61.6  Temp (24hrs), Avg:99.1 F (37.3 C), Min:98.6 F (37 C), Max:99.5 F (37.5 C)  Recent Labs  Lab 11/30/23 0235 12/01/23 0416 12/02/23 0430 12/03/23 0352 12/04/23 0508  WBC 14.8* 14.5* 10.3 12.1* 12.1*  CREATININE 4.09* 4.02* 4.22* 3.19* 2.59*    Estimated Creatinine Clearance: 18 mL/min (A) (by C-G formula based on SCr of 2.59 mg/dL (H)).    Allergies  Allergen Reactions   Iodine Anaphylaxis and Other (See Comments)    Pt states that she is allergic to ingested iodine only, okay for betadine.     Iodine I-131 Tositumomab Anaphylaxis   Shellfish Allergy Anaphylaxis   Codeine Nausea And Vomiting   Morphine  Sulfate Nausea And Vomiting   Irbesartan Other (See Comments)     Unknown  (Avapro)   Sulfa Antibiotics Other (See Comments)    Fever     Antimicrobials this admission:   >>    >>   Dose adjustments this admission:   Microbiology results:  BCx:   UCx:    Sputum:    MRSA PCR:   Thank you for allowing pharmacy to be a part of this patient's care.  Manjot Hinks D 12/06/2023 6:59 AM

## 2023-12-07 ENCOUNTER — Inpatient Hospital Stay
Admit: 2023-12-07 | Discharge: 2023-12-07 | Disposition: A | Discharge status: Home / Self Care | Attending: Obstetrics and Gynecology | Admitting: Obstetrics and Gynecology

## 2023-12-07 ENCOUNTER — Inpatient Hospital Stay
Admit: 2023-12-07 | Discharge: 2023-12-07 | Disposition: A | Discharge status: Home / Self Care | Attending: Internal Medicine | Admitting: Internal Medicine

## 2023-12-07 DIAGNOSIS — I160 Hypertensive urgency: Secondary | ICD-10-CM | POA: Diagnosis not present

## 2023-12-07 LAB — ECHOCARDIOGRAM LIMITED
Height: 67 in
Weight: 2737.23 [oz_av]

## 2023-12-07 LAB — RENAL FUNCTION PANEL
Albumin: 1.8 g/dL — ABNORMAL LOW (ref 3.5–5.0)
Anion gap: 11 (ref 5–15)
BUN: 28 mg/dL — ABNORMAL HIGH (ref 8–23)
CO2: 27 mmol/L (ref 22–32)
Calcium: 7.7 mg/dL — ABNORMAL LOW (ref 8.9–10.3)
Chloride: 94 mmol/L — ABNORMAL LOW (ref 98–111)
Creatinine, Ser: 2.65 mg/dL — ABNORMAL HIGH (ref 0.44–1.00)
GFR, Estimated: 18 mL/min — ABNORMAL LOW (ref >60)
Glucose, Bld: 238 mg/dL — ABNORMAL HIGH (ref 70–99)
Phosphorus: 2.8 mg/dL (ref 2.5–4.6)
Potassium: 3.5 mmol/L (ref 3.5–5.1)
Sodium: 132 mmol/L — ABNORMAL LOW (ref 135–145)

## 2023-12-07 LAB — MRSA NEXT GEN BY PCR, NASAL: MRSA by PCR Next Gen: DETECTED — AB

## 2023-12-07 LAB — GLUCOSE, CAPILLARY
Glucose-Capillary: 311 mg/dL — ABNORMAL HIGH (ref 70–99)
Glucose-Capillary: 243 mg/dL — ABNORMAL HIGH (ref 70–99)
Glucose-Capillary: 317 mg/dL — ABNORMAL HIGH (ref 70–99)
Glucose-Capillary: 185 mg/dL — ABNORMAL HIGH (ref 70–99)

## 2023-12-07 LAB — CBC
HCT: 21.6 % — ABNORMAL LOW (ref 36.0–46.0)
Hemoglobin: 7.1 g/dL — ABNORMAL LOW (ref 12.0–15.0)
MCH: 30.9 pg (ref 26.0–34.0)
MCHC: 32.9 g/dL (ref 30.0–36.0)
MCV: 93.9 fL (ref 80.0–100.0)
Platelets: 150 10*3/uL (ref 150–400)
RBC: 2.3 MIL/uL — ABNORMAL LOW (ref 3.87–5.11)
RDW: 14.4 % (ref 11.5–15.5)
WBC: 13.2 10*3/uL — ABNORMAL HIGH (ref 4.0–10.5)
nRBC: 0 % (ref 0.0–0.2)

## 2023-12-07 LAB — VANCOMYCIN, TROUGH: Vancomycin Tr: 12 ug/mL — ABNORMAL LOW (ref 15–20)

## 2023-12-07 MED ORDER — INSULIN GLARGINE-YFGN 100 UNIT/ML ~~LOC~~ SOLN
10.0000 [IU] | Freq: Every day | SUBCUTANEOUS | Status: DC
  Administered 2023-12-09 – 2023-12-14 (×6): 10 [IU] via SUBCUTANEOUS
  Filled 2023-12-07 (×9): qty 0.1

## 2023-12-07 MED ORDER — INSULIN ASPART 100 UNIT/ML IJ SOLN
3.0000 [IU] | Freq: Three times a day (TID) | INTRAMUSCULAR | Status: DC
  Administered 2023-12-09 – 2023-12-14 (×10): 3 [IU] via SUBCUTANEOUS
  Filled 2023-12-07 (×11): qty 1

## 2023-12-07 NOTE — Progress Notes (Signed)
 Patient ID: Mckenzie Lawrence, female   DOB: 09-Jan-1946, 78 y.o.   MRN: [id number] [facility]    SUBJECTIVE: Patient complains of feeling weak tired fatigue shortness of breath is reasonable no fever chills or sweats no chest pain   Vitals:   12/07/23 0300 12/07/23 0342 12/07/23 0500 12/07/23 0742  BP:  (!) 146/52  (!) 147/56  Pulse:  81  78  Resp: (!) 24 20  18   Temp:  99.4 F (37.4 C)  98.7 F (37.1 C)  TempSrc:      SpO2:  (!) 84%  93%  Weight:   77.6 kg   Height:         Intake/Output Summary (Last 24 hours) at 12/07/2023 0957 Last data filed at 12/06/2023 2100 Gross per 24 hour  Intake 480 ml  Output 1000 ml  Net -520 ml      PHYSICAL EXAM  General: Well developed, well nourished, in no acute distress HEENT:  Normocephalic and atramatic Neck:  No JVD.  Lungs: Diminished bilaterally to auscultation and percussion.  Diffuse rhonchi bilaterally Heart: HRRR . Normal S1 and S2 without gallops or murmurs.  Abdomen: Bowel sounds are positive, abdomen soft and non-tender  Msk:  Back normal, normal gait. Normal strength and tone for age. Extremities: No clubbing, cyanosis or edema.   Neuro: Alert and oriented X 3. Psych:  Good affect, responds appropriately   LABS: Basic Metabolic Panel: Recent Labs    12/06/23 0750  NA 130*  K 3.9  CL 93*  CO2 26  GLUCOSE 182*  BUN 32*  CREATININE 3.03*  CALCIUM  7.9*  PHOS 4.0   Liver Function Tests: Recent Labs    12/06/23 0750  ALBUMIN 2.0*   No results for input(s): LIPASE, AMYLASE in the last 72 hours. CBC: Recent Labs    12/06/23 0750  WBC 15.6*  HGB 8.2*  HCT 25.2*  MCV 94.0  PLT 193   Cardiac Enzymes: No results for input(s): CKTOTAL, CKMB, CKMBINDEX, TROPONINI in the last 72 hours. BNP: Invalid input(s): POCBNP D-Dimer: No results for input(s): DDIMER in the last 72 hours. Hemoglobin A1C: No results for input(s): HGBA1C in the last 72 hours. Fasting Lipid Panel: No results for  input(s): CHOL, HDL, LDLCALC, TRIG, CHOLHDL, LDLDIRECT in the last 72 hours. Thyroid Function Tests: No results for input(s): TSH, T4TOTAL, T3FREE, THYROIDAB in the last 72 hours.  Invalid input(s): FREET3 Anemia Panel: No results for input(s): VITAMINB12, FOLATE, FERRITIN, TIBC, IRON, RETICCTPCT in the last 72 hours.  No results found.   Echo preserved left ventricular function previously plan for TEE tomorrow  TELEMETRY: Normal sinus rhythm rate of 90:  ASSESSMENT AND PLAN:  Principal Problem:   Hypertensive urgency/emergency Active Problems:   Hyponatremia   CAD (coronary artery disease)   COPD with acute exacerbation (HCC)   Acute renal failure superimposed on stage 4 chronic kidney disease (HCC)   Chest pain   Hypertensive emergency   Acute dyspnea   Encounter for dialysis catheter care [facility])    Plan MRSA bacteremia continue broad-spectrum antibiotic therapy recommend TEE to rule out SBE agree with removal of dialysis catheter Hypertensive heart disease continue current therapy and medically goal is 130/80 Congestive heart failure diastolic dysfunction recommend continued management SGL P2 Aldactone diuretics beta-blocker if tolerated End-stage renal disease on dialysis continue dialysis management as per nephrology Moderate to severe COPD continue inhalers supplemental oxygen as necessary Pneumonia thought to be multifocal continue broad-spectrum antibiotic therapy as well as COPD management  History of CVA no residual deficits continue current management Agree with ID recommendations with broad-spectrum IV antibiotic therapy as well as TEE to rule out SBE  Cara JONETTA Lovelace, MD 12/07/2023 9:57 AM

## 2023-12-07 NOTE — Progress Notes (Signed)
 Asked patient if she would like to sign her consent for her TEE. Pt stated My daughter does all that. Educated patient to let her daughter know when she gets back. Pt verbalizes understanding. States she does not want to eat dinner.

## 2023-12-07 NOTE — Plan of Care (Signed)
  Problem: Education: Goal: Ability to describe self-care measures that may prevent or decrease complications (Diabetes Survival Skills Education) will improve Outcome: Progressing   Problem: Coping: Goal: Ability to adjust to condition or change in health will improve Outcome: Progressing   Problem: Fluid Volume: Goal: Ability to maintain a balanced intake and output will improve Outcome: Progressing   Problem: Metabolic: Goal: Ability to maintain appropriate glucose levels will improve Outcome: Progressing   Problem: Skin Integrity: Goal: Risk for impaired skin integrity will decrease Outcome: Progressing   Problem: Nutritional: Goal: Maintenance of adequate nutrition will improve Outcome: Progressing   Problem: Nutritional: Goal: Progress toward achieving an optimal weight will improve Outcome: Progressing   Problem: Tissue Perfusion: Goal: Adequacy of tissue perfusion will improve Outcome: Progressing   Problem: Clinical Measurements: Goal: Respiratory complications will improve Outcome: Progressing   Problem: Clinical Measurements: Goal: Cardiovascular complication will be avoided Outcome: Progressing   Problem: Activity: Goal: Risk for activity intolerance will decrease Outcome: Progressing   Problem: Nutrition: Goal: Adequate nutrition will be maintained Outcome: Progressing   Problem: Skin Integrity: Goal: Risk for impaired skin integrity will decrease Outcome: Progressing   Problem: Safety: Goal: Ability to remain free from injury will improve Outcome: Progressing   Problem: Respiratory: Goal: Ability to maintain a clear airway will improve Outcome: Progressing   Problem: Clinical Measurements: Goal: Complications related to the disease process, condition or treatment will be avoided or minimized Outcome: Progressing   Plan of care, assessment, treatment, monitoring, and intervention (s) ongoing, see MAR see flowsheet

## 2023-12-07 NOTE — Plan of Care (Signed)
 Problem: Education: Goal: Ability to describe self-care measures that may prevent or decrease complications (Diabetes Survival Skills Education) will improve Outcome: Progressing Goal: Individualized Educational Video(s) Outcome: Progressing   Problem: Coping: Goal: Ability to adjust to condition or change in health will improve Outcome: Progressing   Problem: Fluid Volume: Goal: Ability to maintain a balanced intake and output will improve Outcome: Progressing   Problem: Health Behavior/Discharge Planning: Goal: Ability to identify and utilize available resources and services will improve Outcome: Progressing Goal: Ability to manage health-related needs will improve Outcome: Progressing   Problem: Metabolic: Goal: Ability to maintain appropriate glucose levels will improve Outcome: Progressing   Problem: Nutritional: Goal: Maintenance of adequate nutrition will improve Outcome: Progressing Goal: Progress toward achieving an optimal weight will improve Outcome: Progressing   Problem: Skin Integrity: Goal: Risk for impaired skin integrity will decrease Outcome: Progressing   Problem: Tissue Perfusion: Goal: Adequacy of tissue perfusion will improve Outcome: Progressing   Problem: Education: Goal: Knowledge of General Education information will improve Description: Including pain rating scale, medication(s)/side effects and non-pharmacologic comfort measures Outcome: Progressing   Problem: Health Behavior/Discharge Planning: Goal: Ability to manage health-related needs will improve Outcome: Progressing   Problem: Clinical Measurements: Goal: Ability to maintain clinical measurements within normal limits will improve Outcome: Progressing Goal: Will remain free from infection Outcome: Progressing Goal: Diagnostic test results will improve Outcome: Progressing Goal: Respiratory complications will improve Outcome: Progressing Goal: Cardiovascular complication will  be avoided Outcome: Progressing   Problem: Activity: Goal: Risk for activity intolerance will decrease Outcome: Progressing   Problem: Nutrition: Goal: Adequate nutrition will be maintained Outcome: Progressing   Problem: Coping: Goal: Level of anxiety will decrease Outcome: Progressing   Problem: Elimination: Goal: Will not experience complications related to bowel motility Outcome: Progressing Goal: Will not experience complications related to urinary retention Outcome: Progressing   Problem: Pain Managment: Goal: General experience of comfort will improve and/or be controlled Outcome: Progressing   Problem: Safety: Goal: Ability to remain free from injury will improve Outcome: Progressing   Problem: Skin Integrity: Goal: Risk for impaired skin integrity will decrease Outcome: Progressing   Problem: Education: Goal: Ability to demonstrate management of disease process will improve Outcome: Progressing Goal: Ability to verbalize understanding of medication therapies will improve Outcome: Progressing Goal: Individualized Educational Video(s) Outcome: Progressing   Problem: Activity: Goal: Capacity to carry out activities will improve Outcome: Progressing   Problem: Cardiac: Goal: Ability to achieve and maintain adequate cardiopulmonary perfusion will improve Outcome: Progressing   Problem: Education: Goal: Knowledge of disease or condition will improve Outcome: Progressing Goal: Knowledge of the prescribed therapeutic regimen will improve Outcome: Progressing Goal: Individualized Educational Video(s) Outcome: Progressing   Problem: Activity: Goal: Ability to tolerate increased activity will improve Outcome: Progressing Goal: Will verbalize the importance of balancing activity with adequate rest periods Outcome: Progressing   Problem: Respiratory: Goal: Ability to maintain a clear airway will improve Outcome: Progressing Goal: Levels of oxygenation  will improve Outcome: Progressing Goal: Ability to maintain adequate ventilation will improve Outcome: Progressing   Problem: Education: Goal: Knowledge of disease and its progression will improve Outcome: Progressing Goal: Individualized Educational Video(s) Outcome: Progressing   Problem: Fluid Volume: Goal: Compliance with measures to maintain balanced fluid volume will improve Outcome: Progressing   Problem: Health Behavior/Discharge Planning: Goal: Ability to manage health-related needs will improve Outcome: Progressing   Problem: Nutritional: Goal: Ability to make healthy dietary choices will improve Outcome: Progressing   Problem: Clinical Measurements: Goal: Complications related to  the disease process, condition or treatment will be avoided or minimized Outcome: Progressing

## 2023-12-07 NOTE — Progress Notes (Signed)
 \ PROGRESS NOTE   HPI was taken from Dr. Cleatus: Mckenzie Lawrence is a 78 y.o. female with medical history significant for COPD, CVA, stage 4 CKD, SIADH, esophageal dysphagia treated with Botox  (last in 05/2023), DM, HLD, and HTN, last hospitalized from 2/1 to 07/26/2023 with MRSA bacteremia/sepsis/multifocal pneumonia, now presenting by EMS with shortness of breath and chest pressure.  Daughter at bedside gives history and states that her mother was in her usual state of health until earlier in the day on 6/12 when she developed shortness of breath while getting an MRI on her back.  His symptoms seem to improve however on the morning of 6/13 she awoke at 2 AM gasping for breath and called EMS.  She states patient's blood pressure had been running high lately due to ongoing back pain and she had also complained of swelling in her legs.  She has no orthopnea and sleeps on 1 pillow.  She has a chronic congested cough that has not changed and has no fever or chills  BP on arrival 202/74 and tachypneic to 25 saturating at 98% on room air.  Afebrile and pulse in the 70s.  Labs notable for WBC 11,000 with hemoglobin of 9 which is her baseline. Troponin 19 and BNP 2450 Creatinine 3.59 up from baseline of 2.74 with bicarb 19.  Glucose 207, potassium 5.2, sodium 123 (137 four months ago) EKG with sinus rhythm at 79 Chest x-ray showing small right pleural effusion and vascular congestion Bilateral lower extremity venous ultrasound ordered-result pending. Patient treated with DuoNeb and Solu-Medrol  and given a dose of Lasix  Hospitalist consulted for admission for COPD exacerbation and possible new onset CHF.      Mckenzie Lawrence  FMW:[id number] DOB: 1946-01-23 DOA: 11/28/2023 PCP: [name], MD   Assessment & Plan:   Principal Problem:   Hypertensive urgency/emergency Active Problems:   COPD with acute exacerbation (HCC)   Acute dyspnea   CAD (coronary artery disease)   Chest pain   Acute renal  failure superimposed on stage 4 chronic kidney disease (HCC)   Hyponatremia   Hypertensive emergency   Encounter for dialysis catheter care [facility])  Assessment and Plan:  MRSA bacteremia: fever on 6/19, blood cultures are positive. Hx mrsa bacteremia 2/24 2/2 infected spinal hardware, had recurrent bout of mrsa bacteremia earlier this year no source identified. Per daughter no overt signs infection on mri obtained earlier this month. Has dialysis catheter placed this hospitalization, removed by vascular on 6/21. Will exchange PIV as well. Repeat blood cultures ordered today. TTE ordered for today, tentative plan for TEE tomorrow. Patient's only pain is in her low back. I suggested repeat MRI of lumbar spine today, daughter declines, we will let ID weigh in tomorrow. Continuing vanc, sensitivities pending.  Hypertensive urgency/emergency: resolved, BPs appropriate today Continue on metoprolol , losartan , amlodipine . Have increased clonidine  dose . IV hydralazine  prn   Acute diastolic CHF: w/ elevated BNP, CXR showing vascular congestion, pitting LE edema & dyspnea. Monitor I/Os. Continue on metoprolol , losartan . Fluid/volume management w/ HD. Echo shows EF 50-55%, grade II diastolic dysfunction, no regional wall motion abnormalities. Cardio following and recs apprec    COPD exacerbation: tessalon , bronchodilators & encourage incentive spirometry. Off steroids 6/18, appears resolved  Chest pain: w/ hx of CAD. Troponins are minimally elevated. EKG shows no ischemic changes. Continue on losartan , metoprolol , statin, aspirin . No CP currently   ESRD progressing from CKDIV: C /p tunneled HD cath placed. Started on HD 12/02/23. Will need outpatient HD  spot prior to d/c.  Nephro following and recs apprec. Dialysis on tts schedule. Has outpatient chair  Anxiety: severity unknown. Valium  discontinued 6/19  Sedation: resolved  Hyperkalemia: will managed w/ HD    Low back pain: see above   Hyponatremia:  w/ hx of SIADH. Labile. Will be managed w/ HD. Nephro following and recs apprec   ACD: likely secondary to CKD. H&H are stable  Acute diarrhea: x 3 days. Resolved as per pt    Esophageal dysphagia: s/p botox  via EGD in 08/2022. Aspiration precautions. Continue w/ supportive care  Decreased PO: daughter concerned about intake, will request formal RD evaluation   OSA: CPAP qhs  DM2: well controlled at baseline, HbA1c 6.1. sugars labile here, elevated this morning, will increase semglee  to 10  Insomnia: decreased zolpidem  as above  Debility: PT rec is now snf, toc consulted. Messaged shanea lining about this on 6/21, no response, have messaged her again today  Constipation: reports stools once weekly at baseline,  declines lactulose . Is on miralax .     DVT prophylaxis: heparin  Code Status: dnr  Family Communication: daughter at bedside 6/22 Disposition Plan: likely snf  Level of care: Progressive  Status is: Inpatient Remains inpatient appropriate because: severity of illness   Consultants:  Nephro, cardiology, ID   Antimicrobials: vancomycin   Subjective: Feeling somewhat better, was up in chair, drank her boost  Objective: Vitals:   12/07/23 0342 12/07/23 0500 12/07/23 0742 12/07/23 1133  BP: (!) 146/52  (!) 147/56 (!) 133/44  Pulse: 81  78 73  Resp: 20  18 17   Temp: 99.4 F (37.4 C)  98.7 F (37.1 C) 97.8 F (36.6 C)  TempSrc:      SpO2: (!) 84%  93% 93%  Weight:  77.6 kg    Height:        Intake/Output Summary (Last 24 hours) at 12/07/2023 1230 Last data filed at 12/06/2023 2100 Gross per 24 hour  Intake 480 ml  Output --  Net 480 ml    Filed Weights   12/05/23 0500 12/06/23 0500 12/07/23 0500  Weight: 75.1 kg 71.9 kg 77.6 kg    Examination:  General exam: appears chronically ill, NAD Respiratory system: diminished breath sounds b/l, no wheeze Cardiovascular system: S1/S2+. No rubs or gallops  Gastrointestinal system: abd is soft, NT  Central  nervous system: alert & oriented. Moving all 4 Psychiatry: judgement and insight appears at baseline. Flat mood and affect    Data Reviewed: I have personally reviewed following labs and imaging studies  CBC: Recent Labs  Lab 12/02/23 0430 12/03/23 0352 12/04/23 0508 12/06/23 0750 12/07/23 1219  WBC 10.3 12.1* 12.1* 15.6* 13.2*  HGB 8.2* 7.8* 8.6* 8.2* 7.1*  HCT 24.2* 23.6* 27.7* 25.2* 21.6*  MCV 91.0 90.1 93.9 94.0 93.9  PLT 229 222 228 193 150   Basic Metabolic Panel: Recent Labs  Lab 12/01/23 0416 12/02/23 0430 12/03/23 0352 12/04/23 0508 12/06/23 0750  NA 127* 127* 130* 131* 130*  K 5.3* 5.7* 4.7 4.2 3.9  CL 97* 96* 98 97* 93*  CO2 20* 20* 24 26 26   GLUCOSE 237* 331* 224* 116* 182*  BUN 82* 87* 61* 38* 32*  CREATININE 4.02* 4.22* 3.19* 2.59* 3.03*  CALCIUM  8.4* 8.3* 8.1* 8.0* 7.9*  PHOS  --   --   --   --  4.0   GFR: Estimated Creatinine Clearance: 17 mL/min (A) (by C-G formula based on SCr of 3.03 mg/dL (H)). Liver Function Tests: Recent Labs  Lab  12/06/23 0750  ALBUMIN 2.0*   No results for input(s): LIPASE, AMYLASE in the last 168 hours. No results for input(s): AMMONIA in the last 168 hours. Coagulation Profile: No results for input(s): INR, PROTIME in the last 168 hours. Cardiac Enzymes: No results for input(s): CKTOTAL, CKMB, CKMBINDEX, TROPONINI in the last 168 hours. BNP (last 3 results) No results for input(s): PROBNP in the last 8760 hours. HbA1C: No results for input(s): HGBA1C in the last 72 hours. CBG: Recent Labs  Lab 12/06/23 1229 12/06/23 1621 12/06/23 2042 12/07/23 0742 12/07/23 1203  GLUCAP 141* 261* 213* 311* 243*   Lipid Profile: No results for input(s): CHOL, HDL, LDLCALC, TRIG, CHOLHDL, LDLDIRECT in the last 72 hours. Thyroid Function Tests: No results for input(s): TSH, T4TOTAL, FREET4, T3FREE, THYROIDAB in the last 72 hours. Anemia Panel: No results for input(s):  VITAMINB12, FOLATE, FERRITIN, TIBC, IRON, RETICCTPCT in the last 72 hours. Sepsis Labs: No results for input(s): PROCALCITON, LATICACIDVEN in the last 168 hours.  Recent Results (from the past 240 hours)  Culture, blood (Routine X 2) w Reflex to ID Panel     Status: Abnormal (Preliminary result)   Collection Time: 12/04/23  6:34 PM   Specimen: BLOOD  Result Value Ref Range Status   Specimen Description   Final    BLOOD BLOOD RIGHT ARM Performed at [facility], [address] [place], [place] [address]    Special Requests   Final    BOTTLES DRAWN AEROBIC AND ANAEROBIC Blood Culture adequate volume Performed at [facility], [address] [place], [place] [address]    Culture  Setup Time   Final    Organism ID to follow IN BOTH AEROBIC AND ANAEROBIC BOTTLES GRAM POSITIVE COCCI CRITICAL RESULT CALLED TO, READ BACK BY AND VERIFIED WITH: SELINDA SIMPERS 12/06/2023 AT 9351 SRR Performed at [facility], [address], [place], [place] [address]    Culture (A)  Final    STAPHYLOCOCCUS AUREUS SUSCEPTIBILITIES TO FOLLOW Performed at [facility], [address], [place], [place] [address]    Report Status PENDING  Incomplete  Culture, blood (Routine X 2) w Reflex to ID Panel     Status: Abnormal (Preliminary result)   Collection Time: 12/04/23  6:34 PM   Specimen: BLOOD  Result Value Ref Range Status   Specimen Description   Final    BLOOD BLOOD LEFT ARM Performed at [facility], [address] [place], [place] [address]    Special Requests   Final    BOTTLES DRAWN AEROBIC AND ANAEROBIC Blood Culture adequate volume Performed at [facility], [address], [place], [place] [address]    Culture  Setup Time   Final    GRAM POSITIVE COCCI IN BOTH AEROBIC AND ANAEROBIC BOTTLES CRITICAL VALUE NOTED.  VALUE IS CONSISTENT WITH PREVIOUSLY REPORTED AND CALLED VALUE. Performed at [facility], [address] [place], [place] [address]    Culture STAPHYLOCOCCUS AUREUS (A)  Final   Report Status PENDING  Incomplete  Blood Culture ID Panel (Reflexed)     Status: Abnormal   Collection Time: 12/04/23  6:34 PM  Result Value Ref Range Status   Enterococcus faecalis NOT DETECTED NOT DETECTED Final   Enterococcus Faecium NOT DETECTED NOT DETECTED Final   Listeria monocytogenes NOT DETECTED NOT DETECTED Final   Staphylococcus species DETECTED (A) NOT DETECTED Final    Comment: CRITICAL RESULT CALLED TO, READ BACK BY AND VERIFIED WITH: JASON ROBBINS  12/06/2023 AT 9351 SRR    Staphylococcus aureus (BCID) DETECTED (A) NOT DETECTED Final    Comment: Methicillin (oxacillin)-resistant Staphylococcus aureus (MRSA). MRSA is predictably resistant to beta-lactam antibiotics (except ceftaroline). Preferred therapy is vancomycin  unless clinically contraindicated. Patient requires contact precautions if  hospitalized. CRITICAL RESULT CALLED TO, READ BACK BY AND VERIFIED WITH: JASON ROBBINS 12/06/2023 AT 0648 SRR    Staphylococcus epidermidis NOT DETECTED NOT DETECTED Final   Staphylococcus lugdunensis NOT DETECTED NOT DETECTED Final   Streptococcus species NOT DETECTED NOT DETECTED Final   Streptococcus agalactiae NOT DETECTED NOT DETECTED Final   Streptococcus pneumoniae NOT DETECTED NOT DETECTED Final   Streptococcus pyogenes NOT DETECTED NOT DETECTED Final   A.calcoaceticus-baumannii NOT DETECTED NOT DETECTED Final   Bacteroides fragilis NOT DETECTED NOT DETECTED Final   Enterobacterales NOT DETECTED NOT DETECTED Final   Enterobacter cloacae complex NOT DETECTED NOT DETECTED Final   Escherichia coli NOT DETECTED NOT DETECTED Final   Klebsiella aerogenes NOT DETECTED NOT DETECTED Final   Klebsiella oxytoca NOT DETECTED NOT DETECTED Final   Klebsiella pneumoniae NOT DETECTED NOT DETECTED Final   Proteus species NOT DETECTED NOT DETECTED Final   Salmonella species NOT DETECTED NOT DETECTED Final    Serratia marcescens NOT DETECTED NOT DETECTED Final   Haemophilus influenzae NOT DETECTED NOT DETECTED Final   Neisseria meningitidis NOT DETECTED NOT DETECTED Final   Pseudomonas aeruginosa NOT DETECTED NOT DETECTED Final   Stenotrophomonas maltophilia NOT DETECTED NOT DETECTED Final   Candida albicans NOT DETECTED NOT DETECTED Final   Candida auris NOT DETECTED NOT DETECTED Final   Candida glabrata NOT DETECTED NOT DETECTED Final   Candida krusei NOT DETECTED NOT DETECTED Final   Candida parapsilosis NOT DETECTED NOT DETECTED Final   Candida tropicalis NOT DETECTED NOT DETECTED Final   Cryptococcus neoformans/gattii NOT DETECTED NOT DETECTED Final   Meth resistant mecA/C and MREJ DETECTED (A) NOT DETECTED Final    Comment: CRITICAL RESULT CALLED TO, READ BACK BY AND VERIFIED WITH: JASON ROBBINS 12/06/2023 AT 9351 SRR Performed at [facility], [address] [place], [place] [address]   SARS Coronavirus 2 by RT PCR (hospital order, performed in [facility] hospital lab) *cepheid single result test* Anterior Nasal Swab     Status: None   Collection Time: 12/04/23  8:40 PM   Specimen: Anterior Nasal Swab  Result Value Ref Range Status   SARS Coronavirus 2 by RT PCR NEGATIVE NEGATIVE Final    Comment: (NOTE) SARS-CoV-2 target nucleic acids are NOT DETECTED.  The SARS-CoV-2 RNA is generally detectable in upper and lower respiratory specimens during the acute phase of infection. The lowest concentration of SARS-CoV-2 viral copies this assay can detect is 250 copies / mL. A negative result does not preclude SARS-CoV-2 infection and should not be used as the sole basis for treatment or other patient management decisions.  A negative result may occur with improper specimen collection / handling, submission of specimen other than nasopharyngeal swab, presence of viral mutation(s) within the areas targeted by this assay, and inadequate number of viral copies (<250 copies /  mL). A negative result must be combined with clinical observations, patient history, and epidemiological information.  Fact Sheet for Patients:   RoadLapTop.co.za  Fact Sheet for Healthcare Providers: http://kim-miller.com/  This test is not yet approved or  cleared by the [place]  FDA and has been authorized for detection and/or diagnosis of SARS-CoV-2 by FDA under an Emergency Use Authorization (EUA).  This EUA will remain in  effect (meaning this test can be used) for the duration of the COVID-19 declaration under Section 564(b)(1) of the Act, 21 U.S.C. section 360bbb-3(b)(1), unless the authorization is terminated or revoked sooner.  Performed at [facility], [address] [place], [place] [address]   Urine Culture (for pregnant, neutropenic or urologic patients or patients with an indwelling urinary catheter)     Status: Abnormal   Collection Time: 12/04/23  8:45 PM   Specimen: Urine, Clean Catch  Result Value Ref Range Status   Specimen Description   Final    URINE, CLEAN CATCH Performed at [facility], [address] [place], [place] [address]    Special Requests   Final    NONE Performed at [facility], [address] [place], [place] [address]    Culture (A)  Final    <10,000 COLONIES/mL INSIGNIFICANT GROWTH Performed at [facility], [address], [place], [place] [address]    Report Status 12/06/2023 FINAL  Final  Respiratory (~20 pathogens) panel by PCR     Status: None   Collection Time: 12/04/23 11:30 PM   Specimen: Nasopharyngeal Swab; Respiratory  Result Value Ref Range Status   Adenovirus NOT DETECTED NOT DETECTED Final   Coronavirus 229E NOT DETECTED NOT DETECTED Final    Comment: (NOTE) The Coronavirus on the Respiratory Panel, DOES NOT test for the novel  Coronavirus (2019 nCoV)    Coronavirus HKU1 NOT DETECTED NOT DETECTED Final   Coronavirus NL63 NOT  DETECTED NOT DETECTED Final   Coronavirus OC43 NOT DETECTED NOT DETECTED Final   Metapneumovirus NOT DETECTED NOT DETECTED Final   Rhinovirus / Enterovirus NOT DETECTED NOT DETECTED Final   Influenza A NOT DETECTED NOT DETECTED Final   Influenza B NOT DETECTED NOT DETECTED Final   Parainfluenza Virus 1 NOT DETECTED NOT DETECTED Final   Parainfluenza Virus 2 NOT DETECTED NOT DETECTED Final   Parainfluenza Virus 3 NOT DETECTED NOT DETECTED Final   Parainfluenza Virus 4 NOT DETECTED NOT DETECTED Final   Respiratory Syncytial Virus NOT DETECTED NOT DETECTED Final   Bordetella pertussis NOT DETECTED NOT DETECTED Final   Bordetella Parapertussis NOT DETECTED NOT DETECTED Final   Chlamydophila pneumoniae NOT DETECTED NOT DETECTED Final   Mycoplasma pneumoniae NOT DETECTED NOT DETECTED Final    Comment: Performed at [facility], [address] [place], [place] [address]  MRSA Next Gen by PCR, Nasal     Status: Abnormal   Collection Time: 12/07/23  9:00 AM   Specimen: Nasal Mucosa; Nasal Swab  Result Value Ref Range Status   MRSA by PCR Next Gen DETECTED (A) NOT DETECTED Final    Comment: RESULT CALLED TO, READ BACK BY AND VERIFIED WITH: ELIZABETH HAYS 12/07/23 1041 SLM (NOTE) The GeneXpert MRSA Assay (FDA approved for NASAL specimens only), is one component of a comprehensive MRSA colonization surveillance program. It is not intended to diagnose MRSA infection nor to guide or monitor treatment for MRSA infections. Test performance is not FDA approved in patients less than 58 years old. Performed at [facility], [address] [place], [place] [address]          Radiology Studies: No results found.        Scheduled Meds:  amLODipine   10 mg Oral Daily   aspirin  EC  81 mg Oral QHS   Chlorhexidine  Gluconate Cloth  6 each Topical Q0600   cloNIDine   0.2 mg Oral BID   epoetin  alfa-epbx (RETACRIT )  injection  4,000 Units Intravenous Q T,Th,Sat-1800   heparin   injection (subcutaneous)  5,000 Units Subcutaneous Q8H   insulin  aspart  0-5 Units Subcutaneous QHS   insulin  aspart  0-9 Units Subcutaneous TID WC   insulin  aspart  2 Units Subcutaneous TID WC   insulin  glargine-yfgn  8 Units Subcutaneous QHS   lactose free nutrition  237 mL Oral TID WC   lactulose   30 g Oral Once   lidocaine   10 mL Intradermal Once   losartan   100 mg Oral QHS   metoprolol  succinate  100 mg Oral QHS   multivitamin  1 tablet Oral QHS   polyethylene glycol  17 g Oral Daily   Continuous Infusions:  anticoagulant sodium citrate     vancomycin        LOS: 9 days       Devaughn KATHEE Ban, MD [facility]  If 7PM-7AM, please contact night-coverage [email or web address] 12/07/2023, 12:30 PM

## 2023-12-07 NOTE — NC FL2 (Signed)
 [place]  MEDICAID FL2 LEVEL OF CARE FORM     IDENTIFICATION  Patient Name: Mckenzie Lawrence Birthdate: Sep 15, 1945 Sex: female Admission Date (Current Location): 11/28/2023  [facility] and [place] Number:  Chiropodist and Address:  [facility], [address], [place], [place] [address]      Provider Number: [id number]  Attending Physician Name and Address:  Kandis Devaughn Sayres, MD  Relative Name and Phone Number:  Daughter([name] Leora ) [phone]    Current Level of Care: Hospital Recommended Level of Care: Skilled Nursing Facility Prior Approval Number:    Date Approved/Denied:   PASRR Number: [id number]  Discharge Plan: SNF    Current Diagnoses: Patient Active Problem List   Diagnosis Date Noted   Encounter for dialysis catheter care (HCC) 12/02/2023   Chest pain 11/28/2023   Hypertensive emergency 11/28/2023   Acute dyspnea 11/28/2023   MRSA bacteremia 07/23/2023   Pneumonia due to infectious organism 07/23/2023   AKI (acute kidney injury) (HCC) 07/23/2023   Sepsis due to pneumonia (HCC) 07/20/2023   Multifocal pneumonia 07/19/2023   Acute renal failure superimposed on stage 4 chronic kidney disease (HCC) 07/19/2023   SIADH (syndrome of inappropriate ADH production) (HCC) 07/19/2023   SIRS (systemic inflammatory response syndrome) (HCC) 07/19/2023   Hypertensive urgency/emergency 07/19/2023   COPD with acute exacerbation (HCC)    Lumbar surgical wound fluid collection 08/13/2022   Type 2 diabetes mellitus with other specified complication (HCC) 08/09/2022   Stage 4 chronic kidney disease (HCC) 08/09/2022   Wound infection complicating hardware (HCC) 08/08/2022   S/P lumbar spinal fusion 07/08/2022   Recurrent displacement of lumbar disc 05/20/2022   Acute cystitis without hematuria    Acute pancreatitis 11/10/2021   Acute lower UTI 11/10/2021   Diabetes mellitus with renal manifestations, controlled (HCC) 11/10/2021   CKD  (chronic kidney disease) stage 4, GFR 15-29 ml/min (HCC) 11/10/2021   Lumbar spinal stenosis 10/01/2021   Esophageal spasm 08/10/2021   Esophageal dysphagia 06/28/2021   Heart palpitations 10/12/2020   Situational anxiety 07/08/2019   Chronic kidney disease, stage 3 unspecified (HCC) 03/01/2019   Edema of lower extremity 03/01/2019   Proteinuria 03/01/2019   Encounter for orthopedic follow-up care 11/04/2018   CAD (coronary artery disease) 10/26/2018   Pain in right hand 07/22/2018   Degeneration of lumbar intervertebral disc 02/06/2018   Scoliosis deformity of spine 02/06/2018   Spondylolisthesis, grade 1 02/06/2018   Low back pain 02/04/2018   Spinal stenosis of lumbar region 02/04/2018   Trigger finger 01/26/2018   CVA (cerebral vascular accident) (HCC) 03/25/2016   OSA (obstructive sleep apnea) 02/15/2016   Hyponatremia 09/25/2015   Vitamin B12 deficiency 09/12/2015   Lacunar stroke of left subthalamic region (HCC) 09/12/2015   Diabetic polyneuropathy associated with diabetes mellitus due to underlying condition (HCC) 09/12/2015   Stroke (HCC) 03/15/2015   Accelerated hypertension 03/15/2015   Vertigo 09/24/2013   Female stress incontinence 02/10/2013   Incomplete emptying of bladder 02/10/2013   Increased frequency of urination 02/10/2013   Urge incontinence 02/10/2013   Syncope 12/04/2011   Tobacco abuse 12/04/2011   Elevated cholesterol    Cataracts, bilateral    Hypertension    Broken foot    PCOS (polycystic ovarian syndrome)    Type 2 diabetes mellitus with hyperlipidemia (HCC)    Osteopenia     Orientation RESPIRATION BLADDER Height & Weight     Self, Time, Situation, Place  Normal Continent Weight: 77.6 kg Height:  5' 7 (170.2  cm)  BEHAVIORAL SYMPTOMS/MOOD NEUROLOGICAL BOWEL NUTRITION STATUS      Continent Diet  AMBULATORY STATUS COMMUNICATION OF NEEDS Skin   Limited Assist Verbally Normal                       Personal Care Assistance Level of  Assistance  Bathing, Dressing Bathing Assistance: Limited assistance   Dressing Assistance: Limited assistance     Functional Limitations Info             SPECIAL CARE FACTORS FREQUENCY  PT (By licensed PT), OT (By licensed OT)     PT Frequency: 5x a wk OT Frequency: 5x  a wk            Contractures Contractures Info: Not present    Additional Factors Info  Code Status Code Status Info: DNR             Current Medications (12/07/2023):  This is the current hospital active medication list Current Facility-Administered Medications  Medication Dose Route Frequency Provider Last Rate Last Admin   acetaminophen  (TYLENOL ) tablet 650 mg  650 mg Oral Q6H PRN Dew, Jason S, MD   650 mg at 12/07/23 1500   Or   acetaminophen  (TYLENOL ) suppository 650 mg  650 mg Rectal Q6H PRN Dew, Jason S, MD       albuterol  (PROVENTIL ) (2.5 MG/3ML) 0.083% nebulizer solution 2.5 mg  2.5 mg Nebulization Q2H PRN Dew, Jason S, MD   2.5 mg at 12/07/23 9149   amLODipine  (NORVASC ) tablet 10 mg  10 mg Oral Daily Dew, Jason S, MD   10 mg at 12/07/23 0842   anticoagulant sodium citrate solution 5 mL  5 mL Intracatheter PRN Marea Selinda RAMAN, MD       aspirin  EC tablet 81 mg  81 mg Oral QHS Dew, Jason S, MD   81 mg at 12/06/23 2123   Chlorhexidine  Gluconate Cloth 2 % PADS 6 each  6 each Topical Q0600 Dew, Jason S, MD   6 each at 12/06/23 9341   cloNIDine  (CATAPRES ) tablet 0.2 mg  0.2 mg Oral BID Kandis Devaughn Sayres, MD   0.2 mg at 12/07/23 9157   epoetin  alfa-epbx (RETACRIT ) injection 4,000 Units  4,000 Units Intravenous Q T,Th,Sat-1800 Levorn Ramonita SQUIBB, NP   4,000 Units at 12/06/23 0902   guaiFENesin -dextromethorphan  (ROBITUSSIN DM) 100-10 MG/5ML syrup 5 mL  5 mL Oral Q4H PRN Dew, Jason S, MD   5 mL at 11/28/23 1301   heparin  injection 1,000 Units  1,000 Units Intracatheter PRN Marea Selinda RAMAN, MD       heparin  injection 5,000 Units  5,000 Units Subcutaneous Q8H Trudy Anthony HERO, MD   5,000 Units at 12/07/23  1501   hydrALAZINE  (APRESOLINE ) injection 20 mg  20 mg Intravenous Q6H PRN Dew, Jason S, MD   20 mg at 11/30/23 1322   insulin  aspart (novoLOG ) injection 0-5 Units  0-5 Units Subcutaneous QHS Dew, Jason S, MD   2 Units at 12/06/23 2125   insulin  aspart (novoLOG ) injection 0-9 Units  0-9 Units Subcutaneous TID WC Dew, Jason S, MD   3 Units at 12/07/23 1236   insulin  aspart (novoLOG ) injection 3 Units  3 Units Subcutaneous TID WC Wouk, Devaughn Sayres, MD       insulin  glargine-yfgn (SEMGLEE ) injection 10 Units  10 Units Subcutaneous QHS Wouk, Devaughn Sayres, MD       lactose free nutrition (BOOST PLUS) liquid 237 mL  237 mL Oral TID  WC Kadali, Renuka A, MD   237 mL at 12/07/23 0844   lactulose  (CHRONULAC ) 10 GM/15ML solution 30 g  30 g Oral Once Wouk, Devaughn Sayres, MD       lidocaine  (XYLOCAINE ) 1 % (with pres) injection 10 mL  10 mL Intradermal Once Esco, Miechia A, MD       losartan  (COZAAR ) tablet 100 mg  100 mg Oral QHS Dew, Jason S, MD   100 mg at 12/06/23 2123   metoprolol  succinate (TOPROL -XL) 24 hr tablet 100 mg  100 mg Oral QHS Dew, Jason S, MD   100 mg at 12/06/23 2123   multivitamin (RENA-VIT) tablet 1 tablet  1 tablet Oral QHS Kadali, Renuka A, MD   1 tablet at 12/06/23 2128   ondansetron  (ZOFRAN ) tablet 4 mg  4 mg Oral Q6H PRN Dew, Jason S, MD   4 mg at 12/05/23 2306   Or   ondansetron  (ZOFRAN ) injection 4 mg  4 mg Intravenous Q6H PRN Dew, Jason S, MD   4 mg at 12/04/23 1248   oxyCODONE -acetaminophen  (PERCOCET/ROXICET) 5-325 MG per tablet 1 tablet  1 tablet Oral Q6H PRN Dew, Jason S, MD   1 tablet at 12/05/23 2305   polyethylene glycol (MIRALAX  / GLYCOLAX ) packet 17 g  17 g Oral Daily Wouk, Devaughn Sayres, MD       sucralfate  (CARAFATE ) tablet 1 g  1 g Oral BID PRN Dew, Jason S, MD       vancomycin  (VANCOREADY) IVPB 750 mg/150 mL  750 mg Intravenous Q dialysis Hallaji, Sheema M, RPH       zolpidem  (AMBIEN ) tablet 2.5 mg  2.5 mg Oral QHS PRN Wouk, Devaughn Sayres, MD         Discharge  Medications: Please see discharge summary for a list of discharge medications.  Relevant Imaging Results:  Relevant Lab Results:   Additional Information [id number]  Marinda Cooks, RN

## 2023-12-07 NOTE — Progress Notes (Signed)
 [facility]  ROUNDING NOTE   Subjective:  [name] is a 78 y.o.  female with past medical history of hypertension, long-standing diabetes mellitus type 2, lower extremity edema, hyperlipidemia, tobacco abuse, prior history of CVA, COPD, obstructive sleep apnea, lumbar spinal fusion, left upper lobe lung mass who presents with volume overload and increasing shortness of breath.   Update: Patient did undergo PermCath removal yesterday. Tolerated procedure well. States that she felt tired after dialysis treatment yesterday. TEE scheduled for tomorrow.   Objective:  Vital signs in last 24 hours:  Temp:  [98.7 F (37.1 C)-99.6 F (37.6 C)] 98.7 F (37.1 C) (06/22 0742) Pulse Rate:  [78-91] 78 (06/22 0742) Resp:  [13-28] 18 (06/22 0742) BP: (125-175)/(44-66) 147/56 (06/22 0742) SpO2:  [84 %-96 %] 93 % (06/22 0742) Weight:  [77.6 kg] 77.6 kg (06/22 0500)  Weight change: 5.7 kg Filed Weights   12/05/23 0500 12/06/23 0500 12/07/23 0500  Weight: 75.1 kg 71.9 kg 77.6 kg    Intake/Output: I/O last 3 completed shifts: In: 480 [P.O.:480] Out: 1000 [Other:1000]   Intake/Output this shift:  No intake/output data recorded.  Physical Exam: General: No acute distress  Head: Normocephalic, atraumatic. Dry mucosal membranes  Eyes: Anicteric  Neck: Supple  Lungs:  Normal breathing effort  Heart: Regular rate and rhythm  Abdomen:  Soft, nontender, BS present  Extremities: Trace peripheral edema.  Neurologic: Nonfocal, moving all four extremities  Skin: No lesions  Dialysis Access: Right IJ PermCath    Basic Metabolic Panel: Recent Labs  Lab 12/01/23 0416 12/02/23 0430 12/03/23 0352 12/04/23 0508 12/06/23 0750  NA 127* 127* 130* 131* 130*  K 5.3* 5.7* 4.7 4.2 3.9  CL 97* 96* 98 97* 93*  CO2 20* 20* 24 26 26   GLUCOSE 237* 331* 224* 116* 182*  BUN 82* 87* 61* 38* 32*  CREATININE 4.02* 4.22* 3.19* 2.59* 3.03*  CALCIUM  8.4* 8.3* 8.1* 8.0* 7.9*  PHOS   --   --   --   --  4.0    Liver Function Tests: Recent Labs  Lab 12/06/23 0750  ALBUMIN 2.0*   No results for input(s): LIPASE, AMYLASE in the last 168 hours. No results for input(s): AMMONIA in the last 168 hours.  CBC: Recent Labs  Lab 12/01/23 0416 12/02/23 0430 12/03/23 0352 12/04/23 0508 12/06/23 0750  WBC 14.5* 10.3 12.1* 12.1* 15.6*  HGB 7.4* 8.2* 7.8* 8.6* 8.2*  HCT 22.6* 24.2* 23.6* 27.7* 25.2*  MCV 91.5 91.0 90.1 93.9 94.0  PLT 236 229 222 228 193    Cardiac Enzymes: No results for input(s): CKTOTAL, CKMB, CKMBINDEX, TROPONINI in the last 168 hours.  BNP: Invalid input(s): POCBNP  CBG: Recent Labs  Lab 12/06/23 0728 12/06/23 1229 12/06/23 1621 12/06/23 2042 12/07/23 0742  GLUCAP 173* 141* 261* 213* 311*    Microbiology: Results for orders placed or performed during the hospital encounter of 11/28/23  Culture, blood (Routine X 2) w Reflex to ID Panel     Status: None (Preliminary result)   Collection Time: 12/04/23  6:34 PM   Specimen: BLOOD  Result Value Ref Range Status   Specimen Description BLOOD BLOOD RIGHT ARM  Final   Special Requests   Final    BOTTLES DRAWN AEROBIC AND ANAEROBIC Blood Culture adequate volume   Culture  Setup Time   Final    Organism ID to follow IN BOTH AEROBIC AND ANAEROBIC BOTTLES GRAM POSITIVE COCCI CRITICAL RESULT CALLED TO, READ BACK BY AND  VERIFIED WITH: SELINDA SIMPERS 12/06/2023 AT 9351 SRR Performed at [facility], [address] [place], [place] [address]    Culture [facility] POSITIVE COCCI  Final   Report Status PENDING  Incomplete  Culture, blood (Routine X 2) w Reflex to ID Panel     Status: None (Preliminary result)   Collection Time: 12/04/23  6:34 PM   Specimen: BLOOD  Result Value Ref Range Status   Specimen Description BLOOD BLOOD LEFT ARM  Final   Special Requests   Final    BOTTLES DRAWN AEROBIC AND ANAEROBIC Blood Culture adequate volume   Culture  Setup Time   Final     GRAM POSITIVE COCCI IN BOTH AEROBIC AND ANAEROBIC BOTTLES CRITICAL VALUE NOTED.  VALUE IS CONSISTENT WITH PREVIOUSLY REPORTED AND CALLED VALUE. Performed at [facility], [address] [place], [place] [address]    Culture [facility] POSITIVE COCCI  Final   Report Status PENDING  Incomplete  Blood Culture ID Panel (Reflexed)     Status: Abnormal   Collection Time: 12/04/23  6:34 PM  Result Value Ref Range Status   Enterococcus faecalis NOT DETECTED NOT DETECTED Final   Enterococcus Faecium NOT DETECTED NOT DETECTED Final   Listeria monocytogenes NOT DETECTED NOT DETECTED Final   Staphylococcus species DETECTED (A) NOT DETECTED Final    Comment: CRITICAL RESULT CALLED TO, READ BACK BY AND VERIFIED WITH: JASON ROBBINS 12/06/2023 AT 0648 SRR    Staphylococcus aureus (BCID) DETECTED (A) NOT DETECTED Final    Comment: Methicillin (oxacillin)-resistant Staphylococcus aureus (MRSA). MRSA is predictably resistant to beta-lactam antibiotics (except ceftaroline). Preferred therapy is vancomycin  unless clinically contraindicated. Patient requires contact precautions if  hospitalized. CRITICAL RESULT CALLED TO, READ BACK BY AND VERIFIED WITH: JASON ROBBINS 12/06/2023 AT 0648 SRR    Staphylococcus epidermidis NOT DETECTED NOT DETECTED Final   Staphylococcus lugdunensis NOT DETECTED NOT DETECTED Final   Streptococcus species NOT DETECTED NOT DETECTED Final   Streptococcus agalactiae NOT DETECTED NOT DETECTED Final   Streptococcus pneumoniae NOT DETECTED NOT DETECTED Final   Streptococcus pyogenes NOT DETECTED NOT DETECTED Final   A.calcoaceticus-baumannii NOT DETECTED NOT DETECTED Final   Bacteroides fragilis NOT DETECTED NOT DETECTED Final   Enterobacterales NOT DETECTED NOT DETECTED Final   Enterobacter cloacae complex NOT DETECTED NOT DETECTED Final   Escherichia coli NOT DETECTED NOT DETECTED Final   Klebsiella aerogenes NOT DETECTED NOT DETECTED Final   Klebsiella oxytoca NOT DETECTED  NOT DETECTED Final   Klebsiella pneumoniae NOT DETECTED NOT DETECTED Final   Proteus species NOT DETECTED NOT DETECTED Final   Salmonella species NOT DETECTED NOT DETECTED Final   Serratia marcescens NOT DETECTED NOT DETECTED Final   Haemophilus influenzae NOT DETECTED NOT DETECTED Final   Neisseria meningitidis NOT DETECTED NOT DETECTED Final   Pseudomonas aeruginosa NOT DETECTED NOT DETECTED Final   Stenotrophomonas maltophilia NOT DETECTED NOT DETECTED Final   Candida albicans NOT DETECTED NOT DETECTED Final   Candida auris NOT DETECTED NOT DETECTED Final   Candida glabrata NOT DETECTED NOT DETECTED Final   Candida krusei NOT DETECTED NOT DETECTED Final   Candida parapsilosis NOT DETECTED NOT DETECTED Final   Candida tropicalis NOT DETECTED NOT DETECTED Final   Cryptococcus neoformans/gattii NOT DETECTED NOT DETECTED Final   Meth resistant mecA/C and MREJ DETECTED (A) NOT DETECTED Final    Comment: CRITICAL RESULT CALLED TO, READ BACK BY AND VERIFIED WITH: JASON ROBBINS 12/06/2023 AT 9351 SRR Performed at [facility], [address]  [place], [place] [address]   SARS Coronavirus 2 by RT PCR (hospital order, performed in [facility] hospital lab) *cepheid single result test* Anterior Nasal Swab     Status: None   Collection Time: 12/04/23  8:40 PM   Specimen: Anterior Nasal Swab  Result Value Ref Range Status   SARS Coronavirus 2 by RT PCR NEGATIVE NEGATIVE Final    Comment: (NOTE) SARS-CoV-2 target nucleic acids are NOT DETECTED.  The SARS-CoV-2 RNA is generally detectable in upper and lower respiratory specimens during the acute phase of infection. The lowest concentration of SARS-CoV-2 viral copies this assay can detect is 250 copies / mL. A negative result does not preclude SARS-CoV-2 infection and should not be used as the sole basis for treatment or other patient management decisions.  A negative result may occur with improper specimen collection / handling,  submission of specimen other than nasopharyngeal swab, presence of viral mutation(s) within the areas targeted by this assay, and inadequate number of viral copies (<250 copies / mL). A negative result must be combined with clinical observations, patient history, and epidemiological information.  Fact Sheet for Patients:   RoadLapTop.co.za  Fact Sheet for Healthcare Providers: http://kim-miller.com/  This test is not yet approved or  cleared by the [place]  FDA and has been authorized for detection and/or diagnosis of SARS-CoV-2 by FDA under an Emergency Use Authorization (EUA).  This EUA will remain in effect (meaning this test can be used) for the duration of the COVID-19 declaration under Section 564(b)(1) of the Act, 21 U.S.C. section 360bbb-3(b)(1), unless the authorization is terminated or revoked sooner.  Performed at [facility], [address], [place], [place] [address]   Urine Culture (for pregnant, neutropenic or urologic patients or patients with an indwelling urinary catheter)     Status: Abnormal   Collection Time: 12/04/23  8:45 PM   Specimen: Urine, Clean Catch  Result Value Ref Range Status   Specimen Description   Final    URINE, CLEAN CATCH Performed at [facility], [address], [place], [place] [address]    Special Requests   Final    NONE Performed at [facility], [address], [place], [place] [address]    Culture (A)  Final    <10,000 COLONIES/mL INSIGNIFICANT GROWTH Performed at [facility], [address], [place], [place] [address]    Report Status 12/06/2023 FINAL  Final  Respiratory (~20 pathogens) panel by PCR     Status: None   Collection Time: 12/04/23 11:30 PM   Specimen: Nasopharyngeal Swab; Respiratory  Result Value Ref Range Status   Adenovirus NOT DETECTED NOT DETECTED Final   Coronavirus 229E NOT DETECTED NOT DETECTED Final    Comment:  (NOTE) The Coronavirus on the Respiratory Panel, DOES NOT test for the novel  Coronavirus (2019 nCoV)    Coronavirus HKU1 NOT DETECTED NOT DETECTED Final   Coronavirus NL63 NOT DETECTED NOT DETECTED Final   Coronavirus OC43 NOT DETECTED NOT DETECTED Final   Metapneumovirus NOT DETECTED NOT DETECTED Final   Rhinovirus / Enterovirus NOT DETECTED NOT DETECTED Final   Influenza A NOT DETECTED NOT DETECTED Final   Influenza B NOT DETECTED NOT DETECTED Final   Parainfluenza Virus 1 NOT DETECTED NOT DETECTED Final   Parainfluenza Virus 2 NOT DETECTED NOT DETECTED Final   Parainfluenza Virus 3 NOT DETECTED NOT DETECTED Final   Parainfluenza Virus 4 NOT DETECTED NOT DETECTED Final   Respiratory Syncytial Virus NOT DETECTED NOT DETECTED  Final   Bordetella pertussis NOT DETECTED NOT DETECTED Final   Bordetella Parapertussis NOT DETECTED NOT DETECTED Final   Chlamydophila pneumoniae NOT DETECTED NOT DETECTED Final   Mycoplasma pneumoniae NOT DETECTED NOT DETECTED Final    Comment: Performed at [facility], [address], [place], [place] [address]    Coagulation Studies: No results for input(s): LABPROT, INR in the last 72 hours.  Urinalysis: Recent Labs    12/04/23 2045  COLORURINE YELLOW*  LABSPEC 1.019  PHURINE 6.0  GLUCOSEU 150*  HGBUR NEGATIVE  BILIRUBINUR NEGATIVE  KETONESUR NEGATIVE  PROTEINUR >=300*  NITRITE NEGATIVE  LEUKOCYTESUR NEGATIVE      Imaging: No results found.     Medications:    anticoagulant sodium citrate     vancomycin       amLODipine   10 mg Oral Daily   aspirin  EC  81 mg Oral QHS   Chlorhexidine  Gluconate Cloth  6 each Topical Q0600   cloNIDine   0.2 mg Oral BID   epoetin  alfa-epbx (RETACRIT ) injection  4,000 Units Intravenous Q T,Th,Sat-1800   heparin  injection (subcutaneous)  5,000 Units Subcutaneous Q8H   insulin  aspart  0-5 Units Subcutaneous QHS   insulin  aspart  0-9 Units Subcutaneous TID WC   insulin  aspart  2 Units  Subcutaneous TID WC   insulin  glargine-yfgn  8 Units Subcutaneous QHS   lactose free nutrition  237 mL Oral TID WC   lactulose   30 g Oral Once   lidocaine   10 mL Intradermal Once   losartan   100 mg Oral QHS   metoprolol  succinate  100 mg Oral QHS   multivitamin  1 tablet Oral QHS   polyethylene glycol  17 g Oral Daily   acetaminophen  **OR** acetaminophen , albuterol , anticoagulant sodium citrate, guaiFENesin -dextromethorphan , heparin , hydrALAZINE , ondansetron  **OR** ondansetron  (ZOFRAN ) IV, oxyCODONE -acetaminophen , sucralfate , vancomycin , zolpidem   Assessment/ Plan:  [name] is a 78 y.o.  female  with past medical history of hypertension, long-standing diabetes mellitus type 2, lower extremity edema, hyperlipidemia, tobacco abuse, prior history of CVA, COPD, obstructive sleep apnea, lumbar spinal fusion, left upper lobe lung mass who presents with volume overload and increasing shortness of breath.   1.  Acute kidney injury/chronic kidney disease stage IV/diabetes mellitus type 2 with chronic kidney disease/proteinuria/MRSA bacteremia.  eGFR down to 11.  Baseline creatinine 2.7 in February 2025.  Creatinine increased to 3.6-4 in June 2025. - PermCath has been placed  (12/01/2023) and subsequently removed on 12/06/2023 for MRSA bacteremia - Patient completed dialysis on 12/06/2023.  Hold dialysis for now as patient on line holiday. -MRSA bacteremia noted.  PermCath removed 12/06/2023. -Outpatient dialysis seat at [facility] has been secured, first treatment 12/09/2023.  Patient to arrive at 11:30 AM.  Lab Results  Component Value Date   CREATININE 3.03 (H) 12/06/2023   CREATININE 2.59 (H) 12/04/2023   CREATININE 3.19 (H) 12/03/2023     Intake/Output Summary (Last 24 hours) at 12/07/2023 0844 Last data filed at 12/06/2023 2100 Gross per 24 hour  Intake 480 ml  Output 1000 ml  Net -520 ml       2.  Hyponatremia.  Differential includes underlying emphysema, lung nodules,  AKI Most recent serum sodium was 130.  3.  Hyperkalemia Potassium acceptable at 3.9   4.  HTN - 2D echo from 11/29/2023 shows LVEF 55 to 60%, grade 2 diastolic dysfunction, trivial mitral regurgitation Continue amlodipine , losartan , metoprolol , and clonidine .  Blood pressure 147/56 this a.m.   5.  Anemia  of chronic kidney disease.  Hemoglobin now up to 8.2.  Patient has  left upper lobe mass concerning for malignancy.  Discussed with Dr. Lamon.  Okay to use ESA.  Consider transfusion for HgB<7     LOS: 9 Rashon Rezek 6/22/20258:44 AM

## 2023-12-07 NOTE — Progress Notes (Signed)
 Checked on patient. Pt noted to be in bed. Asked patient if she would like some prune juice to help with her BM. Pt declined.

## 2023-12-07 NOTE — TOC Progression Note (Signed)
 Transition of Care [facility]) - Progression Note    Patient Details  Name: Mckenzie Lawrence MRN: [id number] Date of Birth: 1946/04/05  Transition of [facility]) CM/SW Contact  Marinda Cooks, RN Phone Number: 12/07/2023, 4:00 PM  Clinical Narrative:    SNF work up started this CM spoke with pt and her daughter bedside  ,SNF list provided to daughter for review and choice selection& FL2 completed . TOC will cont to follow  care coordination and dc needs and update as applicable.       Expected Discharge Plan and Services                                               Social Determinants of Health (SDOH) Interventions SDOH Screenings   Food Insecurity: No Food Insecurity (11/28/2023)  Housing: Low Risk  (11/28/2023)  Transportation Needs: No Transportation Needs (11/28/2023)  Utilities: Not At Risk (11/28/2023)  Depression (PHQ2-9): Low Risk  (08/05/2023)  Financial Resource Strain: Low Risk  (10/31/2023)   Received from [facility]  Social Connections: Unknown (11/28/2023)  Tobacco Use: High Risk (11/28/2023)    Readmission Risk Interventions    07/22/2023   10:56 AM 08/13/2022    1:04 PM 11/10/2021    2:49 PM  Readmission Risk Prevention Plan  Transportation Screening Complete Complete Complete  PCP or Specialist Appt within 5-7 Days  Complete   PCP or Specialist Appt within 3-5 Days Complete  Complete  Home Care Screening  Complete   Medication Review (RN CM)  Complete   HRI or Home Care Consult Complete  Complete  Social Work Consult for Recovery Care Planning/Counseling Complete  Complete  Palliative Care Screening Not Applicable  Not Applicable  Medication Review Oceanographer) Complete  Complete

## 2023-12-07 NOTE — Progress Notes (Signed)
  Echocardiogram 2D Echocardiogram has been performed. A Limited Echo was requested and completed at this time.  Mckenzie Lawrence Louder 12/07/2023, 11:52 AM

## 2023-12-07 NOTE — Progress Notes (Addendum)
 Family at bedside, requested RN. Entered room and pt's daughter requested a respiratory treatment, a linen change, tylenol  for patient and for staff to replace iso gowns in caddy. Pt up in chair, pt's daughter helped her there. Pt is alert and oriented, although unhappy about being asked orientation questions stating Does she think I'm stupid?. Explained to patient purpose of questions. Pt refusing breakfast.Daughter states she will provide boost as pt does not like other options. Pt asking for a shower. Educated that this RN would check and make sure it was ok with MD. [name] daughter stated I've gotten her in the shower 3 times since she's been here. Educated daughter that if she wanted to continue that it was fine.  RT notified via telephone of request. Tech notified of linen change request. Cardiologist at bedside, resp at bedside. Due to the amount of people in the room, Dawn NT asked to return in a few minutes to change the bed. Offered time for patient and family to ask questions. Pt and family denied questions. Nasal swab performed for MRSA. Call bell in reach. Family at bedside. Educated to utilize call bell for further needs.

## 2023-12-08 ENCOUNTER — Inpatient Hospital Stay
Admit: 2023-12-08 | Discharge: 2023-12-08 | Disposition: A | Discharge status: Home / Self Care | Attending: Internal Medicine | Admitting: Internal Medicine

## 2023-12-08 ENCOUNTER — Inpatient Hospital Stay: Admitting: Anesthesiology

## 2023-12-08 ENCOUNTER — Encounter
Admission: EM | Disposition: A | Discharge status: Skilled Nursing Facility | Payer: Self-pay | Source: Home / Self Care | Attending: Obstetrics and Gynecology

## 2023-12-08 ENCOUNTER — Other Ambulatory Visit (HOSPITAL_COMMUNITY): Payer: Self-pay

## 2023-12-08 ENCOUNTER — Telehealth (HOSPITAL_COMMUNITY): Payer: Self-pay | Admitting: Pharmacy Technician

## 2023-12-08 ENCOUNTER — Encounter: Payer: Self-pay | Admitting: Internal Medicine

## 2023-12-08 DIAGNOSIS — I160 Hypertensive urgency: Secondary | ICD-10-CM | POA: Diagnosis not present

## 2023-12-08 DIAGNOSIS — I480 Paroxysmal atrial fibrillation: Secondary | ICD-10-CM | POA: Insufficient documentation

## 2023-12-08 HISTORY — PX: TEE WITHOUT CARDIOVERSION: SHX5443

## 2023-12-08 LAB — CULTURE, BLOOD (ROUTINE X 2): Special Requests: ADEQUATE

## 2023-12-08 LAB — ECHO TEE

## 2023-12-08 LAB — GLUCOSE, CAPILLARY
Glucose-Capillary: 186 mg/dL — ABNORMAL HIGH (ref 70–99)
Glucose-Capillary: 204 mg/dL — ABNORMAL HIGH (ref 70–99)
Glucose-Capillary: 187 mg/dL — ABNORMAL HIGH (ref 70–99)
Glucose-Capillary: 255 mg/dL — ABNORMAL HIGH (ref 70–99)

## 2023-12-08 LAB — VANCOMYCIN, RANDOM: Vancomycin Rm: 9 ug/mL

## 2023-12-08 SURGERY — ECHOCARDIOGRAM, TRANSESOPHAGEAL
Anesthesia: General

## 2023-12-08 MED ORDER — LIDOCAINE HCL (CARDIAC) PF 100 MG/5ML IV SOSY
PREFILLED_SYRINGE | INTRAVENOUS | Status: AC
Start: 2023-12-08 — End: 2023-12-08
  Filled 2023-12-08: qty 5

## 2023-12-08 MED ORDER — APIXABAN 5 MG PO TABS
5.0000 mg | ORAL_TABLET | Freq: Two times a day (BID) | ORAL | Status: AC
  Administered 2023-12-08 – 2023-12-09 (×3): 5 mg via ORAL
  Filled 2023-12-08 (×3): qty 1

## 2023-12-08 MED ORDER — DILTIAZEM HCL 25 MG/5ML IV SOLN
15.0000 mg | Freq: Once | INTRAVENOUS | Status: AC
  Administered 2023-12-08: 15 mg via INTRAVENOUS
  Filled 2023-12-08: qty 5

## 2023-12-08 MED ORDER — PROPOFOL 10 MG/ML IV BOLUS
INTRAVENOUS | Status: AC
  Filled 2023-12-08: qty 40

## 2023-12-08 MED ORDER — BUTAMBEN-TETRACAINE-BENZOCAINE 2-2-14 % EX AERO
INHALATION_SPRAY | CUTANEOUS | Status: AC
  Administered 2023-12-08: 2
  Filled 2023-12-08: qty 5

## 2023-12-08 MED ORDER — LIDOCAINE 2% (20 MG/ML) 5 ML SYRINGE
INTRAMUSCULAR | Status: DC | PRN
  Administered 2023-12-08: 100 mg via INTRAVENOUS

## 2023-12-08 MED ORDER — SODIUM CHLORIDE 0.9 % IV SOLN: INTRAVENOUS | Status: DC | PRN

## 2023-12-08 MED ORDER — LIDOCAINE VISCOUS HCL 2 % MT SOLN
OROMUCOSAL | Status: AC
  Filled 2023-12-08: qty 15

## 2023-12-08 MED ORDER — SODIUM CHLORIDE 0.9% FLUSH: 3.0000 mL | Freq: Two times a day (BID) | INTRAVENOUS | Status: DC

## 2023-12-08 MED ORDER — VANCOMYCIN VARIABLE DOSE PER UNSTABLE RENAL FUNCTION (PHARMACIST DOSING): Status: DC

## 2023-12-08 MED ORDER — DILTIAZEM HCL-DEXTROSE 125-5 MG/125ML-% IV SOLN (PREMIX)
5.0000 mg/h | INTRAVENOUS | Status: DC
  Administered 2023-12-08: 5 mg/h via INTRAVENOUS
  Filled 2023-12-08: qty 125

## 2023-12-08 MED ORDER — PROPOFOL 10 MG/ML IV BOLUS
INTRAVENOUS | Status: DC | PRN
  Administered 2023-12-08 (×2): 20 mg via INTRAVENOUS
  Administered 2023-12-08: 50 mg via INTRAVENOUS
  Administered 2023-12-08 (×3): 20 mg via INTRAVENOUS

## 2023-12-08 MED ORDER — VANCOMYCIN HCL 1500 MG/300ML IV SOLN
1500.0000 mg | Freq: Once | INTRAVENOUS | Status: AC
  Administered 2023-12-08: 1500 mg via INTRAVENOUS
  Filled 2023-12-08: qty 300

## 2023-12-08 MED ORDER — DILTIAZEM HCL 30 MG PO TABS
60.0000 mg | ORAL_TABLET | Freq: Four times a day (QID) | ORAL | Status: DC
  Administered 2023-12-08 – 2023-12-09 (×3): 60 mg via ORAL
  Filled 2023-12-08: qty 2
  Filled 2023-12-08: qty 1
  Filled 2023-12-08 (×2): qty 2

## 2023-12-08 MED ORDER — SODIUM CHLORIDE 0.9 % IV BOLUS: 500.0000 mL | Freq: Once | INTRAVENOUS | Status: DC

## 2023-12-08 MED ORDER — SODIUM CHLORIDE 0.9% FLUSH: 3.0000 mL | INTRAVENOUS | Status: DC | PRN

## 2023-12-08 NOTE — Progress Notes (Signed)
 Mckenzie Lawrence       Patient ID: Mckenzie Lawrence MRN: [id number] DOB/AGE: Nov 30, 1945 78 y.o.  Admit date: 11/28/2023 Referring Physician Dr. Delayne Solian Primary Physician [name], MD Primary Cardiologist Dr. Ammon Reason for Consultation diastolic heart failure with SOB.  HPI: Mckenzie Lawrence is a 78 y.o. female  with a past medical history of coronary artery disease, hypertension, hyperlipidemia, COPD, OSA, hx CVA, CKD stage 4, SIADH, diabetes mellitus who presented to the ED on 11/28/2023 for worsening and persistent SOB and lower extremity swelling with concurrent COPD exacerbation and found elevated BNP of 2500. Cardiology was consulted for further evaluation.   Interval History: -Patient seen and examined this afternoon. Patient laying in hospital with daughter at bedside. Patient states she feels tired and isn't sleeping well. Denies CP, palpitations or SOB today.  -Patients BP and HR elevated this AM. Patient developed AF RVR 140s early this morning.  -Patient remains on 2L with stable SpO2.  -TEE planned for today with Dr. Wilburn. Patient has remained NPO   Review of systems complete and found to be negative unless listed above    Past Medical History:  Diagnosis Date   Anemia    Arthritis    Bladder incontinence    Broken foot    Cataracts, bilateral    Chronic kidney insufficiency    stage 3b   COPD (chronic obstructive pulmonary disease) (HCC)    wheezing   Coronary artery disease 04/25/2022   in [place]   COVID 2021   very mild case   CVA (cerebral vascular accident) (HCC) 2016   has had 3 strokes, states right side is slightlyweaker than left   Diabetes mellitus    insulin  dependent, Type 2   GERD (gastroesophageal reflux disease)    HLD (hyperlipidemia)    Hx of cardiovascular stress test    a. ETT (6/13):  Ex 5:13; no ischemic changes   Hypertension    controlled on meds   Lacunar stroke of left subthalamic region [facility])  02/2015   Leg pain    left   Lower back pain    Neuromuscular disorder (HCC)    stroke right hand tingling   Orthostatic hypotension    Osteopenia 01/2017   T score -2.0 stable from prior DEXA   Pancreatitis 10/2021   PCOS (polycystic ovarian syndrome)    Personal history of tobacco use, presenting hazards to health 01/09/2015   PONV (postoperative nausea and vomiting)    Sleep apnea    uses CPAP nightly    Past Surgical History:  Procedure Laterality Date   BIOPSY  08/28/2022   Procedure: BIOPSY;  Surgeon: Elicia Claw, MD;  Location: [facility];  Service: Gastroenterology;;   BOTOX  INJECTION  01/15/2022   Procedure: BOTOX  INJECTION;  Surgeon: Rosalie Kitchens, MD;  Location: [facility];  Service: Gastroenterology;;   BOTOX  INJECTION N/A 08/28/2022   Procedure: BOTOX  INJECTION;  Surgeon: Elicia Claw, MD;  Location: [facility];  Service: Gastroenterology;  Laterality: N/A;   BOTOX  INJECTION Bilateral 05/27/2023   Procedure: BOTOX  INJECTION;  Surgeon: Rosalie Kitchens, MD;  Location: [facility];  Service: Gastroenterology;  Laterality: Bilateral;   BROW LIFT Bilateral 11/04/2017   Procedure: BLEPHAROPLASTY UPPER EYELID W/EXCESS SKIN;  Surgeon: Ashley Greig CHRISTELLA, MD;  Location: [facility];  Service: Ophthalmology;  Laterality: Bilateral;  DIABETES-insulin  dependent uses CPAP   CARDIAC CATHETERIZATION  20 yrs ago   found nothing   CARPAL TUNNEL RELEASE Bilateral    CATARACT  EXTRACTION Bilateral    DIALYSIS/PERMA CATHETER INSERTION N/A 12/01/2023   Procedure: DIALYSIS/PERMA CATHETER INSERTION;  Surgeon: Marea Selinda RAMAN, MD;  Location: [facility];  Service: Cardiovascular;  Laterality: N/A;   ELBOW SURGERY Bilateral    ESOPHAGOGASTRODUODENOSCOPY N/A 07/25/2021   Procedure: ESOPHAGOGASTRODUODENOSCOPY (EGD);  Surgeon: Toledo, Ladell POUR, MD;  Location: [facility];  Service: Gastroenterology;  Laterality: N/A;  IDDM   ESOPHAGOGASTRODUODENOSCOPY N/A 01/15/2022   Procedure:  ESOPHAGOGASTRODUODENOSCOPY (EGD);  Surgeon: Rosalie Kitchens, MD;  Location: [facility];  Service: Gastroenterology;  Laterality: N/A;  botox    ESOPHAGOGASTRODUODENOSCOPY (EGD) WITH PROPOFOL  N/A 08/28/2022   Procedure: ESOPHAGOGASTRODUODENOSCOPY (EGD) WITH PROPOFOL ;  Surgeon: Elicia Claw, MD;  Location: [facility];  Service: Gastroenterology;  Laterality: N/A;   ESOPHAGOGASTRODUODENOSCOPY (EGD) WITH PROPOFOL  Bilateral 05/27/2023   Procedure: ESOPHAGOGASTRODUODENOSCOPY (EGD) WITH PROPOFOL ;  Surgeon: Rosalie Kitchens, MD;  Location: [facility];  Service: Gastroenterology;  Laterality: Bilateral;   FOOT SURGERY     Groin Abscess     HAND SURGERY     KNEE SURGERY Bilateral    LABIAL ABSCESS     LUMBAR LAMINECTOMY/DECOMPRESSION MICRODISCECTOMY Left 05/11/2018   Procedure: LUMBAR LAMINECTOMY/DECOMPRESSION MICRODISCECTOMY 1 LEVEL- L4-5;  Surgeon: Bluford Standing, MD;  Location: [facility];  Service: Neurosurgery;  Laterality: Left;   LUMBAR LAMINECTOMY/DECOMPRESSION MICRODISCECTOMY Left 05/20/2022   Procedure: MICRODISCECTOMY L3-4;  Surgeon: Mavis Purchase, MD;  Location: [facility];  Service: Neurosurgery;  Laterality: Left;  3C   LUMBAR WOUND DEBRIDEMENT N/A 08/08/2022   Procedure: INCISION AND DRAINAGE OF LUMBAR WOUND;  Surgeon: Mavis Purchase, MD;  Location: [facility];  Service: Neurosurgery;  Laterality: N/A;  3C   OOPHORECTOMY     BSO   PUBO VAG SLING     SHOULDER SURGERY     bilateral arthroscopies   TEE WITHOUT CARDIOVERSION N/A 07/25/2023   Procedure: TRANSESOPHAGEAL ECHOCARDIOGRAM (TEE);  Surgeon: Hilarie Rocher, MD;  Location: [facility];  Service: Cardiovascular;  Laterality: N/A;   VAGINAL HYSTERECTOMY  1979    Medications Prior to Admission  Medication Sig Dispense Refill Last Dose/Taking   acetaminophen  (TYLENOL ) 500 MG tablet Take 1,000 mg by mouth every 6 (six) hours as needed for moderate pain.   Unknown   albuterol  (VENTOLIN  HFA) 108 (90 Base) MCG/ACT inhaler Inhale 2 puffs into the lungs  every 6 (six) hours as needed for wheezing or shortness of breath.   Unknown   aspirin  EC 81 MG tablet Take 81 mg by mouth at bedtime. Swallow whole.   Taking   calcitRIOL  (ROCALTROL ) 0.25 MCG capsule Take 0.25 mcg by mouth at bedtime.   Taking   cholecalciferol (VITAMIN D3) 25 MCG (1000 UNIT) tablet Take 1,000 Units by mouth daily.   Taking   cyclobenzaprine  (FLEXERIL ) 10 MG tablet Take 1 tablet (10 mg total) by mouth 3 (three) times daily as needed for muscle spasms. 30 tablet 0 Taking As Needed   D-MANNOSE PO Take 2 tablets by mouth at bedtime.   Taking   diazepam  (VALIUM ) 5 MG tablet Take 5 mg by mouth 2 (two) times daily as needed.   Taking As Needed   diphenhydrAMINE (BENADRYL) 25 MG tablet Take 50 mg by mouth at bedtime as needed for sleep.   Taking As Needed   docusate sodium  (COLACE) 100 MG capsule Take 1 capsule (100 mg total) by mouth 2 (two) times daily. (Patient taking differently: Take 100 mg by mouth at bedtime as needed for mild constipation or moderate constipation.) 30 capsule 0 Taking Differently   estradiol  (ESTRACE )  0.1 MG/GM vaginal cream Place 1 Applicatorful vaginally 3 (three) times a week.   Taking   Fe Fum-Vit C-Vit B12-FA (TRIGELS-F FORTE) CAPS capsule Take 1 capsule by mouth daily after breakfast. 90 capsule 0 Taking   insulin  glargine-yfgn (SEMGLEE ) 100 UNIT/ML Pen Inject 10 Units into the skin 2 (two) times daily.   Taking   losartan  (COZAAR ) 100 MG tablet Take 1 tablet (100 mg total) by mouth daily. (Patient taking differently: Take 100 mg by mouth at bedtime.) 30 tablet 2 Taking Differently   metoprolol  succinate (TOPROL -XL) 100 MG 24 hr tablet Take 100 mg by mouth at bedtime.   Taking   Multiple Vitamins-Minerals (PRESERVISION AREDS 2+MULTI VIT PO) Take 1 capsule by mouth daily.   Taking   nitrofurantoin  (MACRODANTIN ) 100 MG capsule Take 100 mg by mouth at bedtime.   Taking   rosuvastatin  (CRESTOR ) 5 MG tablet Take 5 mg by mouth daily.   Taking   sennosides-docusate  sodium (SENOKOT-S) 8.6-50 MG tablet Take 1 tablet by mouth daily as needed for constipation.   Taking As Needed   sodium bicarbonate  650 MG tablet Take 1 tablet (650 mg total) by mouth 2 (two) times daily. (Patient taking differently: Take 650 mg by mouth daily.) 100 tablet 1 Taking Differently   sucralfate  (CARAFATE ) 1 g tablet Take 1 tablet (1 g total) by mouth 2 (two) times daily as needed (acid reflux).   Taking As Needed   Vibegron (GEMTESA) 75 MG TABS Take 75 mg by mouth at bedtime.   Taking   zolpidem  (AMBIEN ) 10 MG tablet Take 10 mg by mouth at bedtime as needed for sleep.   Taking As Needed   amLODipine  (NORVASC ) 10 MG tablet Take 1 tablet (10 mg total) by mouth daily. (Patient not taking: Reported on 11/28/2023) 30 tablet 2 Not Taking   BD INSULIN  SYRINGE U/F 31G X 5/16 1 ML MISC USE 1 SYRINGE AS DIRECTED      Continuous Glucose Sensor (FREESTYLE LIBRE 2 SENSOR) MISC       epoetin  alfa-epbx (RETACRIT ) 4000 UNIT/ML injection Inject 1 mL (4,000 Units total) into the skin once a week. (Patient not taking: Reported on 11/28/2023) 4 mL 3 Not Taking   feeding supplement (BOOST HIGH PROTEIN) LIQD Take 1 Container by mouth 2 (two) times daily between meals.      insulin  detemir (LEVEMIR ) 100 UNIT/ML injection Inject 100-120 Units into the skin daily. (Patient not taking: Reported on 11/28/2023)   Not Taking   nystatin  (MYCOSTATIN ) 100000 UNIT/ML suspension Take 5 mLs (500,000 Units total) by mouth 4 (four) times daily. (Patient not taking: Reported on 11/28/2023) 120 mL 1 Not Taking   ONETOUCH ULTRA test strip 4 (four) times daily.      Social History   Socioeconomic History   Marital status: Married    Spouse name: Not on file   Number of children: 1   Years of education: Not on file   Highest education level: Not on file  Occupational History   Not on file  Tobacco Use   Smoking status: Every Day    Current packs/day: 1.50    Average packs/day: 1.5 packs/day for 50.0 years (75.0 ttl  pk-yrs)    Types: Cigarettes   Smokeless tobacco: Never   Tobacco comments:    3ppd x 10 year, then cut back to 1.5pdd since 02/2015  Vaping Use   Vaping status: Never Used  Substance and Sexual Activity   Alcohol use: Never    Alcohol/week: 0.0  standard drinks of alcohol   Drug use: No   Sexual activity: Not Currently    Birth control/protection: Surgical    Comment: Hx Hysterectomy, 1st intercourse 78 yo-Fewer than 5 partners  Other Topics Concern   Not on file  Social History Narrative   Lives at home with husband in a one story home.  Has 1 daughter.     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     Education: some college.   Social Drivers of Corporate investment banker Strain: Low Risk  (10/31/2023)   Received from [facility]   Overall Financial Resource Strain (CARDIA)    Difficulty of Paying Living Expenses: Not hard at all  Food Insecurity: No Food Insecurity (11/28/2023)   Hunger Vital Sign    Worried About Running Out of Food in the Last Year: Never true    Ran Out of Food in the Last Year: Never true  Transportation Needs: No Transportation Needs (11/28/2023)   PRAPARE - Administrator, Civil Service (Medical): No    Lack of Transportation (Non-Medical): No  Physical Activity: Not on file  Stress: Not on file  Social Connections: Unknown (11/28/2023)   Social Connection and Isolation Panel    Frequency of Communication with Friends and Family: More than three times a week    Frequency of Social Gatherings with Friends and Family: More than three times a week    Attends Religious Services: More than 4 times per year    Active Member of [employer] or Organizations: Not on file    Attends Banker Meetings: Not on file    Marital Status: Not on file  Intimate Partner Violence: Not At Risk (11/28/2023)   Humiliation, Afraid, Rape, and Kick questionnaire    Fear of Current or Ex-Partner: No    Emotionally Abused: No     Physically Abused: No    Sexually Abused: No    Family History  Problem Relation Age of Onset   Hypertension Mother    Heart disease Mother 22       MI   Diabetes Father    Diabetes Sister    Hypertension Sister    Diabetes Brother    Hypertension Brother    Heart disease Brother 79       CAD   Cancer Sister        Multiple myloma   Diabetes Brother      Vitals:   12/08/23 0557 12/08/23 0745 12/08/23 0800 12/08/23 0900  BP: 137/68 (!) 133/50    Pulse: 99 (!) 113    Resp: (!) 25 18 (!) 28 (!) 25  Temp: 98.1 F (36.7 C) 98.4 F (36.9 C)    TempSrc: Oral     SpO2: 96% 97%    Weight:      Height:        PHYSICAL EXAM General: well appearing elderly female, well nourished, in no acute distress. HEENT: Normocephalic and atraumatic. Neck: No JVD.   Lungs: Normal respiratory effort on room air. Diminished breath sounds bilaterally, bibasilar crackles.  Heart: HRRR. Normal S1 and S2, + murmur  Abdomen: Non-distended appearing.  Msk: Normal strength and tone for age. Extremities: Warm and well perfused. No clubbing, cyanosis. 1+ pedal edema bilaterally.  Neuro: Alert and oriented X 3. Psych: Answers questions appropriately.   Labs: Basic Metabolic Panel: Recent Labs    12/06/23 0750 12/07/23 1219  NA 130* 132*  K 3.9 3.5  CL 93* 94*  CO2 26 27  GLUCOSE 182* 238*  BUN 32* 28*  CREATININE 3.03* 2.65*  CALCIUM  7.9* 7.7*  PHOS 4.0 2.8   Liver Function Tests: Recent Labs    12/06/23 0750 12/07/23 1219  ALBUMIN 2.0* 1.8*    No results for input(s): LIPASE, AMYLASE in the last 72 hours. CBC: Recent Labs    12/06/23 0750 12/07/23 1219  WBC 15.6* 13.2*  HGB 8.2* 7.1*  HCT 25.2* 21.6*  MCV 94.0 93.9  PLT 193 150   Cardiac Enzymes: No results for input(s): CKTOTAL, CKMB, CKMBINDEX, TROPONINIHS in the last 72 hours. BNP: No results for input(s): BNP in the last 72 hours. D-Dimer: No results for input(s): DDIMER in the last 72  hours. Hemoglobin A1C: No results for input(s): HGBA1C in the last 72 hours. Fasting Lipid Panel: No results for input(s): CHOL, HDL, LDLCALC, TRIG, CHOLHDL, LDLDIRECT in the last 72 hours. Thyroid Function Tests: No results for input(s): TSH, T4TOTAL, T3FREE, THYROIDAB in the last 72 hours.  Invalid input(s): FREET3 Anemia Panel: No results for input(s): VITAMINB12, FOLATE, FERRITIN, TIBC, IRON, RETICCTPCT in the last 72 hours.   Radiology: ECHOCARDIOGRAM LIMITED Result Date: 12/07/2023    ECHOCARDIOGRAM LIMITED REPORT   Patient Name:   Mckenzie Lawrence Date of Exam: 12/07/2023 Medical Rec #:  [id number]      Height:       67.0 in Accession #:    [id number]     Weight:       171.1 lb Date of Birth:  25-Nov-1945      BSA:          1.892 m Patient Age:    78 years       BP:           147/56 mmHg Patient Gender: F              HR:           62 bpm. Exam Location:  [facility] Procedure: Limited Echo (Both Spectral and Color Flow Doppler were utilized            during procedure). Indications:     Bacteremia R78.81  History:         Patient has prior history of Echocardiogram examinations, most                  recent 11/29/2023.  Sonographer:     Thedora Louder RDCS, FASE Referring Phys:  [phone] DWAYNE D CALLWOOD Diagnosing Phys: Cara JONETTA Lovelace MD  Sonographer Comments: Limited 2D echo requested no spectral or color flow doppler performed on this exam. IMPRESSIONS  1. Aotic valve valve slightly thickened.  2. Mitral valve wth mild thickening.  3. No obvious vegataion seen.  4. Recommed TEE.  5. Left ventricular ejection fraction, by estimation, is 60 to 65%. The left ventricle has normal function. The left ventricle has no regional wall motion abnormalities. Left ventricular diastolic function could not be evaluated.  6. Right ventricular systolic function is normal. The right ventricular size is normal.  7. The mitral valve is normal in structure.  8. The aortic valve is  grossly normal. Aortic valve sclerosis is present, with no evidence of aortic valve stenosis. Conclusion(s)/Recommendation(s): Poor windows for evaluation of left ventricular function by transthoracic echocardiography. Would recommend an alternative means of evaluation. No evidence of valvular vegetations on this transthoracic echocardiogram. Consider a transesophageal echocardiogram to exclude infective endocarditis if clinically indicated. FINDINGS  Left Ventricle: Left ventricular ejection  fraction, by estimation, is 60 to 65%. The left ventricle has normal function. The left ventricle has no regional wall motion abnormalities. The left ventricular internal cavity size was normal in size. There is  no left ventricular hypertrophy. Left ventricular diastolic function could not be evaluated. Right Ventricle: The right ventricular size is normal. No increase in right ventricular wall thickness. Right ventricular systolic function is normal. Left Atrium: Left atrial size was normal in size. Right Atrium: Right atrial size was normal in size. Pericardium: There is no evidence of pericardial effusion. Mitral Valve: The mitral valve is normal in structure. There is mild thickening of the mitral valve leaflet(s). There is mild calcification of the mitral valve leaflet(s). Normal mobility of the mitral valve leaflets. Tricuspid Valve: The tricuspid valve is normal in structure. Aortic Valve: The aortic valve is grossly normal. Aortic valve sclerosis is present, with no evidence of aortic valve stenosis. Pulmonic Valve: The pulmonic valve was normal in structure. Aorta: The ascending aorta was not well visualized. Additional Comments: No obvious vegataion seen. Aotic valve valve slightly thickened. Mitral valve wth mild thickening. Recommed TEE.  Dwayne D Callwood MD Electronically signed by Cara JONETTA Lovelace MD Signature Date/Time: 12/07/2023/1:28:01 PM    Final    DG Chest Port 1 View Result Date: 12/04/2023 CLINICAL  DATA:  Fever EXAM: PORTABLE CHEST 1 VIEW COMPARISON:  11/28/2023 FINDINGS: Right internal jugular hemodialysis catheter tips are seen within the superior vena cava. Lungs appear mildly hyperinflated, stable since prior examination, suggesting changes of underlying COPD. Small right pleural effusion again noted, slightly decreased since prior examination. Focal atelectasis or infiltrate within the right costophrenic angle. Mild diffuse interstitial thickening is again noted in keeping with interstitial pulmonary edema or airway inflammation. No pneumothorax. Stable cardiomegaly. No acute bone abnormality. IMPRESSION: 1. Stable cardiomegaly. 2. Small right pleural effusion, slightly decreased since prior examination. 3. Stable interstitial thickening in keeping with interstitial pulmonary edema or airway inflammation. Electronically Signed   By: Dorethia Molt M.D.   On: 12/04/2023 20:43   PERIPHERAL VASCULAR CATHETERIZATION Result Date: 12/01/2023 See surgical Lawrence for result.  ECHOCARDIOGRAM COMPLETE Result Date: 11/29/2023    ECHOCARDIOGRAM REPORT   Patient Name:   Mckenzie Lawrence Date of Exam: 11/29/2023 Medical Rec #:  [id number]      Height:       67.0 in Accession #:    [id number]     Weight:       176.6 lb Date of Birth:  1946/02/23      BSA:          1.918 m Patient Age:    78 years       BP:           163/51 mmHg Patient Gender: F              HR:           81 bpm. Exam Location:  [facility] Procedure: 2D Echo, Cardiac Doppler and Color Doppler (Both Spectral and Color            Flow Doppler were utilized during procedure). Indications:     CHF I50.31  History:         Patient has prior history of Echocardiogram examinations, most                  recent 07/23/2023.  Sonographer:     Thedora Louder RDCS, FASE Referring Phys:  [id number] ANTHONY CHRISTELLA POUCH Diagnosing Phys: Dwayne D Callwood MD  IMPRESSIONS  1. Left ventricular ejection fraction, by estimation, is 55 to 60%. The left ventricle has normal function.  The left ventricle has no regional wall motion abnormalities. The left ventricular internal cavity size was moderately dilated. Left ventricular diastolic parameters are consistent with Grade II diastolic dysfunction (pseudonormalization).  2. Right ventricular systolic function is normal. The right ventricular size is normal.  3. Left atrial size was mildly dilated.  4. The mitral valve is normal in structure. Trivial mitral valve regurgitation.  5. The aortic valve is normal in structure. Aortic valve regurgitation is not visualized. FINDINGS  Left Ventricle: Left ventricular ejection fraction, by estimation, is 55 to 60%. The left ventricle has normal function. The left ventricle has no regional wall motion abnormalities. Strain was performed and the global longitudinal strain is indeterminate. The left ventricular internal cavity size was moderately dilated. There is no left ventricular hypertrophy. Left ventricular diastolic parameters are consistent with Grade II diastolic dysfunction (pseudonormalization). Right Ventricle: The right ventricular size is normal. No increase in right ventricular wall thickness. Right ventricular systolic function is normal. Left Atrium: Left atrial size was mildly dilated. Right Atrium: Right atrial size was normal in size. Pericardium: There is no evidence of pericardial effusion. Mitral Valve: The mitral valve is normal in structure. Trivial mitral valve regurgitation. Tricuspid Valve: The tricuspid valve is normal in structure. Tricuspid valve regurgitation is mild. Aortic Valve: The aortic valve is normal in structure. Aortic valve regurgitation is not visualized. Aortic valve peak gradient measures 8.8 mmHg. Pulmonic Valve: The pulmonic valve was normal in structure. Pulmonic valve regurgitation is not visualized. Aorta: The ascending aorta was not well visualized. IAS/Shunts: No atrial level shunt detected by color flow Doppler. Additional Comments: 3D was performed not  requiring image post processing on an independent workstation and was indeterminate.  LEFT VENTRICLE PLAX 2D LVIDd:         5.50 cm     Diastology LVIDs:         3.80 cm     LV e' medial:    7.18 cm/s LV PW:         1.00 cm     LV E/e' medial:  19.9 LV IVS:        1.10 cm     LV e' lateral:   8.38 cm/s LVOT diam:     1.80 cm     LV E/e' lateral: 17.1 LV SV:         63 LV SV Index:   33 LVOT Area:     2.54 cm  LV Volumes (MOD) LV vol d, MOD A2C: 74.1 ml LV vol d, MOD A4C: 70.9 ml LV vol s, MOD A2C: 37.0 ml LV vol s, MOD A4C: 28.8 ml LV SV MOD A2C:     37.1 ml LV SV MOD A4C:     70.9 ml LV SV MOD BP:      41.0 ml RIGHT VENTRICLE RV Basal diam:  3.20 cm RV S prime:     14.80 cm/s TAPSE (M-mode): 2.6 cm LEFT ATRIUM             Index        RIGHT ATRIUM           Index LA diam:        4.30 cm 2.24 cm/m   RA Area:     19.70 cm LA Vol (A2C):   44.9 ml 23.41 ml/m  RA Volume:   55.40 ml  28.88  ml/m LA Vol (A4C):   53.8 ml 28.05 ml/m LA Biplane Vol: 51.9 ml 27.06 ml/m  AORTIC VALVE                 PULMONIC VALVE AV Area (Vmax): 1.84 cm     PV Vmax:        1.01 m/s AV Vmax:        148.00 cm/s  PV Peak grad:   4.1 mmHg AV Peak Grad:   8.8 mmHg     RVOT Peak grad: 3 mmHg LVOT Vmax:      107.00 cm/s LVOT Vmean:     76.000 cm/s LVOT VTI:       0.249 m  AORTA Ao Root diam: 2.90 cm MITRAL VALVE MV Area (PHT): 4.86 cm     SHUNTS MV Decel Time: 156 msec     Systemic VTI:  0.25 m MV E velocity: 143.00 cm/s  Systemic Diam: 1.80 cm MV A velocity: 80.30 cm/s MV E/A ratio:  1.78 Dwayne D Callwood MD Electronically signed by Cara JONETTA Lovelace MD Signature Date/Time: 11/29/2023/10:33:04 AM    Final    US  Venous Img Lower Bilateral Result Date: 11/28/2023 CLINICAL DATA:  New onset left greater than right lower extremity swelling, without findings of CHF. EXAM: BILATERAL LOWER EXTREMITY VENOUS DOPPLER ULTRASOUND TECHNIQUE: Gray-scale sonography with graded compression, as well as color Doppler and duplex ultrasound were performed to  evaluate the lower extremity deep venous systems from the level of the common femoral vein and including the common femoral, femoral, profunda femoral, popliteal and calf veins including the posterior tibial, peroneal and gastrocnemius veins when visible. The superficial great saphenous vein was also interrogated. Spectral Doppler was utilized to evaluate flow at rest and with distal augmentation maneuvers in the common femoral, femoral and popliteal veins. COMPARISON:  Left lower extremity DVT exam 08/26/2019. FINDINGS: RIGHT LOWER EXTREMITY Common Femoral Vein: No evidence of thrombus. Normal compressibility, respiratory phasicity and response to augmentation. Saphenofemoral Junction: No evidence of thrombus. Normal compressibility and flow on color Doppler imaging. Profunda Femoral Vein: No evidence of thrombus. Normal compressibility and flow on color Doppler imaging. Femoral Vein: No evidence of thrombus. Normal compressibility, respiratory phasicity and response to augmentation. Popliteal Vein: No evidence of thrombus. Normal compressibility, respiratory phasicity and response to augmentation. Calf Veins: No evidence of thrombus. Normal compressibility and flow on color Doppler imaging. Superficial Great Saphenous Vein: No evidence of thrombus. Normal compressibility. Venous Reflux:  None. Other Findings:  Pulsatile venous waveforms. LEFT LOWER EXTREMITY Common Femoral Vein: No evidence of thrombus. Normal compressibility, respiratory phasicity and response to augmentation. Saphenofemoral Junction: No evidence of thrombus. Normal compressibility and flow on color Doppler imaging. Profunda Femoral Vein: No evidence of thrombus. Normal compressibility and flow on color Doppler imaging. Femoral Vein: No evidence of thrombus. Normal compressibility, respiratory phasicity and response to augmentation. Popliteal Vein: No evidence of thrombus. Normal compressibility, respiratory phasicity and response to  augmentation. Calf Veins: No evidence of thrombus. Normal compressibility and flow on color Doppler imaging. Superficial Great Saphenous Vein: No evidence of thrombus. Normal compressibility. Venous Reflux:  None. Other Findings:  Pulsatile venous waveforms. IMPRESSION: 1. No evidence of deep venous thrombosis in either lower extremity. 2. Pulsatile venous waveforms, which can be seen with right heart failure or tricuspid regurgitation. Electronically Signed   By: Francis Quam M.D.   On: 11/28/2023 04:56   DG Chest Portable 1 View Result Date: 11/28/2023 CLINICAL DATA:  Difficulty breathing EXAM: PORTABLE CHEST 1 VIEW  COMPARISON:  07/19/2023 FINDINGS: Cardiac shadow is mildly enlarged. Aortic calcifications are seen. Mild vascular congestion is noted. Small right-sided pleural effusion is noted. Metallic densities are noted symmetrically over the upper chest likely artifactual in nature. IMPRESSION: Small right pleural effusion and vascular congestion. Electronically Signed   By: Oneil Devonshire M.D.   On: 11/28/2023 02:59    ECHO as above  TELEMETRY reviewed by me 12/08/2023: atrial fibrillation, rate 110-120s  EKG reviewed by me: Normal sinus rhythm, mild right axis nonspecific ST-T wave changes   Data reviewed by me 12/08/2023: last 24h vitals tele labs imaging I/O hospitalist progress Lawrence, nephrology notes.  Principal Problem:   Hypertensive urgency/emergency Active Problems:   Hyponatremia   CAD (coronary artery disease)   COPD with acute exacerbation (HCC)   Acute renal failure superimposed on stage 4 chronic kidney disease (HCC)   Chest pain   Hypertensive emergency   Acute dyspnea   Encounter for dialysis catheter care [facility])    ASSESSMENT AND PLAN:  Mckenzie Lawrence is a 78 y.o. female  with a past medical history of coronary artery disease, hypertension, hyperlipidemia, COPD, OSA, hx CVA, CKD stage 4, SIADH, diabetes mellitus who presented to the ED on 11/28/2023 for worsening and  persistent SOB and lower extremity swelling with concurrent COPD exacerbation and found elevated BNP of 2500. Cardiology was consulted for further evaluation.   # New AF RVR Patient developed AF RVR 140s early this morning (06/23). Per tele s/p IV dilt 10 mg HF improved to 110s-120s.  -Ordered diltiazem gtt for rate control. -Ordered Eliquis 5 mg BID for stroke risk reduction.  # MRSA bacteremia Blood cultures positive for MRSA.  -Plan for TEE (06/23) with Dr. Wilburn to further evaluate and rule out endocarditis. Further recommendations pending results.  -IV broad spectrum antibiotics per primary team.   # ESRD (Plan to start dialysis on 06/16) Permacath placement on 06/17 by Dr. Marea. Patient undergoing dialysis, tolerating well. Permacath removed due to positive MRSA blood cultures. -Nephrology following, appreciate recommendations.   # Acute HFpEF exacerbation # COPD exacerbation  # OSA Patient presents with worsening SOB and LE swelling. BNP elevated at 2450. Echo this admission with pEF, no RWMA, grade II diastolic dysfunction.  -Continue metoprolol  succinate 100 mg daily.  -Continue losartan  as stated below.  -Fluid removal via dialysis.  -GDMT optimization limited due to ESRD.  -Recommend sleep study for OSA if CPAP indicated.  -Recommend breathing treatments and frequent use of incentive spirometry for COPD. Management per primary. -Recommend pulmonology consultation.   # Coronary artery disease # Hypertension # Hyperlipidemia # Demand ischemia Patient without chest pain. Trops minimally elevated and flat 19 > 20. EKG without acute ischemic changes. Echo with no RWMA.   -Continue ASA 81 mg, rosuvastatin  5 mg daily -D/c amlodipine  10 mg daily. Due to starting dilt gtt for AF RVR. -Continue losartan  100 mg daily. -Continue clonidine  0.1 mg BID.  -Continue metoprolol  as stated above. -Minimally elevated and flat trop sin setting of ESRD, heart failure exacerbation is most  consistent with demand/supply mismatch and not ACS.  This patient's plan of care was discussed and created with Dr. Florencio and he is in agreement.  Signed: Dorene Comfort, PA-C  12/08/2023, 10:34 AM [facility]

## 2023-12-08 NOTE — Anesthesia Preprocedure Evaluation (Signed)
 Anesthesia Evaluation  Patient identified by MRN, date of birth, ID band Patient awake    Reviewed: Allergy & Precautions, NPO status , Patient's Chart, lab work & pertinent test results  History of Anesthesia Complications (+) PONV and history of anesthetic complications  Airway Mallampati: III  TM Distance: <3 FB Neck ROM: full    Dental  (+) Chipped   Pulmonary sleep apnea , COPD, Current Smoker   Pulmonary exam normal        Cardiovascular hypertension, + CAD  Normal cardiovascular exam     Neuro/Psych  Neuromuscular disease CVA, Residual Symptoms    GI/Hepatic Neg liver ROS,GERD  Controlled,,  Endo/Other  negative endocrine ROSdiabetes    Renal/GU Renal disease  negative genitourinary   Musculoskeletal   Abdominal   Peds  Hematology negative hematology ROS (+)   Anesthesia Other Findings Past Medical History: No date: Anemia No date: Arthritis No date: Bladder incontinence No date: Broken foot No date: Cataracts, bilateral No date: Chronic kidney insufficiency     Comment:  stage 3b No date: COPD (chronic obstructive pulmonary disease) (HCC)     Comment:  wheezing 04/25/2022: Coronary artery disease     Comment:  in [place] 2021: COVID     Comment:  very mild case 2016: CVA (cerebral vascular accident) (HCC)     Comment:  has had 3 strokes, states right side is slightlyweaker               than left No date: Diabetes mellitus     Comment:  insulin  dependent, Type 2 No date: GERD (gastroesophageal reflux disease) No date: HLD (hyperlipidemia) No date: Hx of cardiovascular stress test     Comment:  a. ETT (6/13):  Ex 5:13; no ischemic changes No date: Hypertension     Comment:  controlled on meds 02/2015: Lacunar stroke of left subthalamic region [facility]) No date: Leg pain     Comment:  left No date: Lower back pain No date: Neuromuscular disorder (HCC)     Comment:  stroke right hand tingling No  date: Orthostatic hypotension 01/2017: Osteopenia     Comment:  T score -2.0 stable from prior DEXA 10/2021: Pancreatitis No date: PCOS (polycystic ovarian syndrome) 01/09/2015: Personal history of tobacco use, presenting hazards to  health No date: PONV (postoperative nausea and vomiting) No date: Sleep apnea     Comment:  uses CPAP nightly  Past Surgical History: 08/28/2022: BIOPSY     Comment:  Procedure: BIOPSY;  Surgeon: Elicia Claw, MD;                Location: [facility];  Service: Gastroenterology;; 01/15/2022: BOTOX  INJECTION     Comment:  Procedure: BOTOX  INJECTION;  Surgeon: Rosalie Kitchens, MD;                Location: [facility];  Service: Gastroenterology;; 08/28/2022: BOTOX  INJECTION; N/A     Comment:  Procedure: BOTOX  INJECTION;  Surgeon: Elicia Claw,              MD;  Location: [facility];  Service: Gastroenterology;               Laterality: N/A; 05/27/2023: BOTOX  INJECTION; Bilateral     Comment:  Procedure: BOTOX  INJECTION;  Surgeon: Rosalie Kitchens, MD;                Location: [facility];  Service: Gastroenterology;                Laterality: Bilateral;  11/04/2017: BROW LIFT; Bilateral     Comment:  Procedure: BLEPHAROPLASTY UPPER EYELID W/EXCESS SKIN;                Surgeon: Ashley Greig HERO, MD;  Location: [facility]               [facility];  Service: Ophthalmology;  Laterality: Bilateral;                DIABETES-insulin  dependent uses CPAP 20 yrs ago: CARDIAC CATHETERIZATION     Comment:  found nothing No date: CARPAL TUNNEL RELEASE; Bilateral No date: CATARACT EXTRACTION; Bilateral 12/01/2023: DIALYSIS/PERMA CATHETER INSERTION; N/A     Comment:  Procedure: DIALYSIS/PERMA CATHETER INSERTION;  Surgeon:               Marea Selinda RAMAN, MD;  Location: [facility];                Service: Cardiovascular;  Laterality: N/A; No date: ELBOW SURGERY; Bilateral 07/25/2021: ESOPHAGOGASTRODUODENOSCOPY; N/A     Comment:  Procedure: ESOPHAGOGASTRODUODENOSCOPY  (EGD);  Surgeon:               Toledo, Ladell POUR, MD;  Location: [facility] END[facility];                Service: Gastroenterology;  Laterality: N/A;  IDDM 01/15/2022: ESOPHAGOGASTRODUODENOSCOPY; N/A     Comment:  Procedure: ESOPHAGOGASTRODUODENOSCOPY (EGD);  Surgeon:               Rosalie Kitchens, MD;  Location: [facility];  Service:               Gastroenterology;  Laterality: N/A;  botox  08/28/2022: ESOPHAGOGASTRODUODENOSCOPY (EGD) WITH PROPOFOL ; N/A     Comment:  Procedure: ESOPHAGOGASTRODUODENOSCOPY (EGD) WITH               PROPOFOL ;  Surgeon: Elicia Claw, MD;  Location: [facility]               ENDOSCOPY;  Service: Gastroenterology;  Laterality: N/A; 05/27/2023: ESOPHAGOGASTRODUODENOSCOPY (EGD) WITH PROPOFOL ; Bilateral     Comment:  Procedure: ESOPHAGOGASTRODUODENOSCOPY (EGD) WITH               PROPOFOL ;  Surgeon: Rosalie Kitchens, MD;  Location: [facility]               ENDOSCOPY;  Service: Gastroenterology;  Laterality:               Bilateral; No date: FOOT SURGERY No date: Groin Abscess No date: HAND SURGERY No date: KNEE SURGERY; Bilateral No date: LABIAL ABSCESS 05/11/2018: LUMBAR LAMINECTOMY/DECOMPRESSION MICRODISCECTOMY; Left     Comment:  Procedure: LUMBAR LAMINECTOMY/DECOMPRESSION               MICRODISCECTOMY 1 LEVEL- L4-5;  Surgeon: Bluford Standing,               MD;  Location: [facility] ORS;  Service: Neurosurgery;                Laterality: Left; 05/20/2022: LUMBAR LAMINECTOMY/DECOMPRESSION MICRODISCECTOMY; Left     Comment:  Procedure: MICRODISCECTOMY L3-4;  Surgeon: Mavis Purchase, MD;  [facility];  Service: Neurosurgery;                Laterality: Left;  3C 08/08/2022: LUMBAR WOUND DEBRIDEMENT; N/A     Comment:  Procedure: INCISION AND DRAINAGE OF LUMBAR WOUND;  Surgeon: Mavis Purchase, MD;  Location: [facility] OR;                Service: Neurosurgery;  Laterality: N/A;  3C No date: OOPHORECTOMY     Comment:  BSO No date: PUBO VAG SLING No date: SHOULDER SURGERY      Comment:  bilateral arthroscopies 07/25/2023: TEE WITHOUT CARDIOVERSION; N/A     Comment:  Procedure: TRANSESOPHAGEAL ECHOCARDIOGRAM (TEE);                Surgeon: Hilarie Rocher, MD;  Location: [facility] ORS;                Service: Cardiovascular;  Laterality: N/A; 1979: VAGINAL HYSTERECTOMY  BMI    Body Mass Index: 27.21 kg/m      Reproductive/Obstetrics negative OB ROS                             Anesthesia Physical Anesthesia Plan  ASA: 3  Anesthesia Plan: General   Post-op Pain Management:    Induction: Intravenous  PONV Risk Score and Plan: Propofol  infusion and TIVA  Airway Management Planned: Natural Airway and Nasal Cannula  Additional Equipment:   Intra-op Plan:   Post-operative Plan:   Informed Consent: I have reviewed the patients History and Physical, chart, labs and discussed the procedure including the risks, benefits and alternatives for the proposed anesthesia with the patient or authorized representative who has indicated his/her understanding and acceptance.     Dental Advisory Given  Plan Discussed with: Anesthesiologist, CRNA and Surgeon  Anesthesia Plan Comments: (Patient consented for risks of anesthesia including but not limited to:  - adverse reactions to medications - risk of airway placement if required - damage to eyes, teeth, lips or other oral mucosa - nerve damage due to positioning  - sore throat or hoarseness - Damage to heart, brain, nerves, lungs, other parts of body or loss of life  Patient voiced understanding and assent.)       Anesthesia Quick Evaluation

## 2023-12-08 NOTE — Anesthesia Postprocedure Evaluation (Signed)
 Anesthesia Post Note  Patient: Mckenzie Lawrence  Procedure(s) Performed: ECHO TEE ECHOCARDIOGRAM, TRANSESOPHAGEAL  Patient location during evaluation: Specials Recovery Anesthesia Type: General Level of consciousness: awake and alert Pain management: pain level controlled Vital Signs Assessment: post-procedure vital signs reviewed and stable Respiratory status: spontaneous breathing, nonlabored ventilation, respiratory function stable and patient connected to nasal cannula oxygen Cardiovascular status: blood pressure returned to baseline and stable Postop Assessment: no apparent nausea or vomiting Anesthetic complications: no   There were no known notable events for this encounter.   Last Vitals:  Vitals:   12/08/23 1327 12/08/23 1345  BP:  (!) 118/51  Pulse: 60 64  Resp: (!) 30 (!) 28  Temp:  36.7 C  SpO2: 98% 98%    Last Pain:  Vitals:   12/08/23 1345  TempSrc: Oral  PainSc: Asleep                 Fairy POUR Harmony Sandell

## 2023-12-08 NOTE — Progress Notes (Signed)
 [facility]  ROUNDING NOTE   Subjective:  [name] is a 78 y.o.  female with past medical history of hypertension, long-standing diabetes mellitus type 2, lower extremity edema, hyperlipidemia, tobacco abuse, prior history of CVA, COPD, obstructive sleep apnea, lumbar spinal fusion, left upper lobe lung mass who presents with volume overload and increasing shortness of breath.   Update: Patient developed atrial fibrillation overnight. Was given diltiazem. Case discussed with cardiology PA. TEE still being planned for today.   Objective:  Vital signs in last 24 hours:  Temp:  [97.8 F (36.6 C)-99.3 F (37.4 C)] 98.4 F (36.9 C) (06/23 0745) Pulse Rate:  [42-113] 113 (06/23 0745) Resp:  [17-28] 25 (06/23 0900) BP: (124-156)/(43-79) 133/50 (06/23 0745) SpO2:  [90 %-98 %] 97 % (06/23 0745) Weight:  [78.8 kg] 78.8 kg (06/23 0500)  Weight change: 1.2 kg Filed Weights   12/06/23 0500 12/07/23 0500 12/08/23 0500  Weight: 71.9 kg 77.6 kg 78.8 kg    Intake/Output: I/O last 3 completed shifts: In: 240 [P.O.:240] Out: -    Intake/Output this shift:  No intake/output data recorded.  Physical Exam: General: No acute distress  Head: Normocephalic, atraumatic. Dry mucosal membranes  Eyes: Anicteric  Neck: Supple  Lungs:  Normal breathing effort  Heart: Regular rate and rhythm  Abdomen:  Soft, nontender, BS present  Extremities: No peripheral edema.  Neurologic: Nonfocal, moving all four extremities  Skin: No lesions  Dialysis Access: Right IJ PermCath    Basic Metabolic Panel: Recent Labs  Lab 12/02/23 0430 12/03/23 0352 12/04/23 0508 12/06/23 0750 12/07/23 1219  NA 127* 130* 131* 130* 132*  K 5.7* 4.7 4.2 3.9 3.5  CL 96* 98 97* 93* 94*  CO2 20* 24 26 26 27   GLUCOSE 331* 224* 116* 182* 238*  BUN 87* 61* 38* 32* 28*  CREATININE 4.22* 3.19* 2.59* 3.03* 2.65*  CALCIUM  8.3* 8.1* 8.0* 7.9* 7.7*  PHOS  --   --   --  4.0 2.8    Liver Function  Tests: Recent Labs  Lab 12/06/23 0750 12/07/23 1219  ALBUMIN 2.0* 1.8*   No results for input(s): LIPASE, AMYLASE in the last 168 hours. No results for input(s): AMMONIA in the last 168 hours.  CBC: Recent Labs  Lab 12/02/23 0430 12/03/23 0352 12/04/23 0508 12/06/23 0750 12/07/23 1219  WBC 10.3 12.1* 12.1* 15.6* 13.2*  HGB 8.2* 7.8* 8.6* 8.2* 7.1*  HCT 24.2* 23.6* 27.7* 25.2* 21.6*  MCV 91.0 90.1 93.9 94.0 93.9  PLT 229 222 228 193 150    Cardiac Enzymes: No results for input(s): CKTOTAL, CKMB, CKMBINDEX, TROPONINI in the last 168 hours.  BNP: Invalid input(s): POCBNP  CBG: Recent Labs  Lab 12/07/23 0742 12/07/23 1203 12/07/23 1641 12/07/23 2223 12/08/23 0745  GLUCAP 311* 243* 317* 185* 186*    Microbiology: Results for orders placed or performed during the hospital encounter of 11/28/23  Culture, blood (Routine X 2) w Reflex to ID Panel     Status: Abnormal   Collection Time: 12/04/23  6:34 PM   Specimen: BLOOD  Result Value Ref Range Status   Specimen Description   Final    BLOOD BLOOD RIGHT ARM Performed at [facility], [address] [place], [place] [address]    Special Requests   Final    BOTTLES DRAWN AEROBIC AND ANAEROBIC Blood Culture adequate volume Performed at [facility], [address] [place], [place] [address]    Culture  Setup Time  Final    Organism ID to follow IN BOTH AEROBIC AND ANAEROBIC BOTTLES GRAM POSITIVE COCCI CRITICAL RESULT CALLED TO, READ BACK BY AND VERIFIED WITH: JASON ROBBINS 12/06/2023 AT 9351 SRR Performed at [facility], [address] [place], [place] [address]    Culture METHICILLIN RESISTANT STAPHYLOCOCCUS AUREUS (A)  Final   Report Status 12/08/2023 FINAL  Final   Organism ID, Bacteria METHICILLIN RESISTANT STAPHYLOCOCCUS AUREUS  Final      Susceptibility   Methicillin resistant staphylococcus aureus - MIC*    CIPROFLOXACIN  >=8 RESISTANT Resistant      ERYTHROMYCIN  >=8 RESISTANT Resistant     GENTAMICIN  <=0.5 SENSITIVE Sensitive     OXACILLIN >=4 RESISTANT Resistant     TETRACYCLINE <=1 SENSITIVE Sensitive     VANCOMYCIN  2 SENSITIVE Sensitive     TRIMETH/SULFA <=10 SENSITIVE Sensitive     CLINDAMYCIN <=0.25 SENSITIVE Sensitive     RIFAMPIN <=0.5 SENSITIVE Sensitive     Inducible Clindamycin NEGATIVE Sensitive     LINEZOLID  2 SENSITIVE Sensitive     * METHICILLIN RESISTANT STAPHYLOCOCCUS AUREUS  Culture, blood (Routine X 2) w Reflex to ID Panel     Status: Abnormal   Collection Time: 12/04/23  6:34 PM   Specimen: BLOOD  Result Value Ref Range Status   Specimen Description   Final    BLOOD BLOOD LEFT ARM Performed at [facility], [address] [place], [place] [address]    Special Requests   Final    BOTTLES DRAWN AEROBIC AND ANAEROBIC Blood Culture adequate volume Performed at [facility], [address] [place], [place] [address]    Culture  Setup Time   Final    GRAM POSITIVE COCCI IN BOTH AEROBIC AND ANAEROBIC BOTTLES CRITICAL VALUE NOTED.  VALUE IS CONSISTENT WITH PREVIOUSLY REPORTED AND CALLED VALUE. Performed at [facility], [address] [place], [place] [address]    Culture (A)  Final    STAPHYLOCOCCUS AUREUS SUSCEPTIBILITIES PERFORMED ON PREVIOUS CULTURE WITHIN THE LAST 5 DAYS. Performed at [facility], [address] [place], [place] [address]    Report Status 12/08/2023 FINAL  Final  Blood Culture ID Panel (Reflexed)     Status: Abnormal   Collection Time: 12/04/23  6:34 PM  Result Value Ref Range Status   Enterococcus faecalis NOT DETECTED NOT DETECTED Final   Enterococcus Faecium NOT DETECTED NOT DETECTED Final   Listeria monocytogenes NOT DETECTED NOT DETECTED Final   Staphylococcus species DETECTED (A) NOT DETECTED Final    Comment: CRITICAL RESULT CALLED TO, READ BACK BY AND VERIFIED WITH: JASON ROBBINS 12/06/2023 AT 0648 SRR    Staphylococcus aureus (BCID)  DETECTED (A) NOT DETECTED Final    Comment: Methicillin (oxacillin)-resistant Staphylococcus aureus (MRSA). MRSA is predictably resistant to beta-lactam antibiotics (except ceftaroline). Preferred therapy is vancomycin  unless clinically contraindicated. Patient requires contact precautions if  hospitalized. CRITICAL RESULT CALLED TO, READ BACK BY AND VERIFIED WITH: JASON ROBBINS 12/06/2023 AT 0648 SRR    Staphylococcus epidermidis NOT DETECTED NOT DETECTED Final   Staphylococcus lugdunensis NOT DETECTED NOT DETECTED Final   Streptococcus species NOT DETECTED NOT DETECTED Final   Streptococcus agalactiae NOT DETECTED NOT DETECTED Final   Streptococcus pneumoniae NOT DETECTED NOT DETECTED Final   Streptococcus pyogenes NOT DETECTED NOT DETECTED Final   A.calcoaceticus-baumannii NOT DETECTED NOT DETECTED Final   Bacteroides fragilis NOT DETECTED NOT DETECTED Final   Enterobacterales NOT DETECTED NOT DETECTED Final   Enterobacter cloacae complex NOT DETECTED NOT  DETECTED Final   Escherichia coli NOT DETECTED NOT DETECTED Final   Klebsiella aerogenes NOT DETECTED NOT DETECTED Final   Klebsiella oxytoca NOT DETECTED NOT DETECTED Final   Klebsiella pneumoniae NOT DETECTED NOT DETECTED Final   Proteus species NOT DETECTED NOT DETECTED Final   Salmonella species NOT DETECTED NOT DETECTED Final   Serratia marcescens NOT DETECTED NOT DETECTED Final   Haemophilus influenzae NOT DETECTED NOT DETECTED Final   Neisseria meningitidis NOT DETECTED NOT DETECTED Final   Pseudomonas aeruginosa NOT DETECTED NOT DETECTED Final   Stenotrophomonas maltophilia NOT DETECTED NOT DETECTED Final   Candida albicans NOT DETECTED NOT DETECTED Final   Candida auris NOT DETECTED NOT DETECTED Final   Candida glabrata NOT DETECTED NOT DETECTED Final   Candida krusei NOT DETECTED NOT DETECTED Final   Candida parapsilosis NOT DETECTED NOT DETECTED Final   Candida tropicalis NOT DETECTED NOT DETECTED Final   Cryptococcus  neoformans/gattii NOT DETECTED NOT DETECTED Final   Meth resistant mecA/C and MREJ DETECTED (A) NOT DETECTED Final    Comment: CRITICAL RESULT CALLED TO, READ BACK BY AND VERIFIED WITH: JASON ROBBINS 12/06/2023 AT 9351 SRR Performed at [facility], [address] [place], [place] [address]   SARS Coronavirus 2 by RT PCR (hospital order, performed in [facility] hospital lab) *cepheid single result test* Anterior Nasal Swab     Status: None   Collection Time: 12/04/23  8:40 PM   Specimen: Anterior Nasal Swab  Result Value Ref Range Status   SARS Coronavirus 2 by RT PCR NEGATIVE NEGATIVE Final    Comment: (NOTE) SARS-CoV-2 target nucleic acids are NOT DETECTED.  The SARS-CoV-2 RNA is generally detectable in upper and lower respiratory specimens during the acute phase of infection. The lowest concentration of SARS-CoV-2 viral copies this assay can detect is 250 copies / mL. A negative result does not preclude SARS-CoV-2 infection and should not be used as the sole basis for treatment or other patient management decisions.  A negative result may occur with improper specimen collection / handling, submission of specimen other than nasopharyngeal swab, presence of viral mutation(s) within the areas targeted by this assay, and inadequate number of viral copies (<250 copies / mL). A negative result must be combined with clinical observations, patient history, and epidemiological information.  Fact Sheet for Patients:   RoadLapTop.co.za  Fact Sheet for Healthcare Providers: http://kim-miller.com/  This test is not yet approved or  cleared by the [place]  FDA and has been authorized for detection and/or diagnosis of SARS-CoV-2 by FDA under an Emergency Use Authorization (EUA).  This EUA will remain in effect (meaning this test can be used) for the duration of the COVID-19 declaration under Section 564(b)(1) of the Act, 21  U.S.C. section 360bbb-3(b)(1), unless the authorization is terminated or revoked sooner.  Performed at [facility], [address] [place], [place] [address]   Urine Culture (for pregnant, neutropenic or urologic patients or patients with an indwelling urinary catheter)     Status: Abnormal   Collection Time: 12/04/23  8:45 PM   Specimen: Urine, Clean Catch  Result Value Ref Range Status   Specimen Description   Final    URINE, CLEAN CATCH Performed at [facility], [address], [place], [place] [address]    Special Requests   Final    NONE Performed at [facility], [address], [place], [place] [address]    Culture (A)  Final    <10,000 COLONIES/mL INSIGNIFICANT GROWTH  Performed at [facility], [address] [place], [place] [address]    Report Status 12/06/2023 FINAL  Final  Respiratory (~20 pathogens) panel by PCR     Status: None   Collection Time: 12/04/23 11:30 PM   Specimen: Nasopharyngeal Swab; Respiratory  Result Value Ref Range Status   Adenovirus NOT DETECTED NOT DETECTED Final   Coronavirus 229E NOT DETECTED NOT DETECTED Final    Comment: (NOTE) The Coronavirus on the Respiratory Panel, DOES NOT test for the novel  Coronavirus (2019 nCoV)    Coronavirus HKU1 NOT DETECTED NOT DETECTED Final   Coronavirus NL63 NOT DETECTED NOT DETECTED Final   Coronavirus OC43 NOT DETECTED NOT DETECTED Final   Metapneumovirus NOT DETECTED NOT DETECTED Final   Rhinovirus / Enterovirus NOT DETECTED NOT DETECTED Final   Influenza A NOT DETECTED NOT DETECTED Final   Influenza B NOT DETECTED NOT DETECTED Final   Parainfluenza Virus 1 NOT DETECTED NOT DETECTED Final   Parainfluenza Virus 2 NOT DETECTED NOT DETECTED Final   Parainfluenza Virus 3 NOT DETECTED NOT DETECTED Final   Parainfluenza Virus 4 NOT DETECTED NOT DETECTED Final   Respiratory Syncytial Virus NOT DETECTED NOT DETECTED Final   Bordetella pertussis NOT DETECTED NOT  DETECTED Final   Bordetella Parapertussis NOT DETECTED NOT DETECTED Final   Chlamydophila pneumoniae NOT DETECTED NOT DETECTED Final   Mycoplasma pneumoniae NOT DETECTED NOT DETECTED Final    Comment: Performed at [facility], [address] [place], [place] [address]  Cath Tip Culture     Status: None (Preliminary result)   Collection Time: 12/06/23  2:45 PM   Specimen: Catheter Tip; Other  Result Value Ref Range Status   Specimen Description   Final    CATH TIP Performed at [facility], [address] [place], [place] [address]    Special Requests   Final    NONE Performed at [facility], [address] [place], [place] [address]    Culture   Final    NO GROWTH < 12 HOURS Performed at [facility], [address], [place], [place] [address]    Report Status PENDING  Incomplete  MRSA Next Gen by PCR, Nasal     Status: Abnormal   Collection Time: 12/07/23  9:00 AM   Specimen: Nasal Mucosa; Nasal Swab  Result Value Ref Range Status   MRSA by PCR Next Gen DETECTED (A) NOT DETECTED Final    Comment: RESULT CALLED TO, READ BACK BY AND VERIFIED WITH: ELIZABETH HAYS 12/07/23 1041 SLM (NOTE) The GeneXpert MRSA Assay (FDA approved for NASAL specimens only), is one component of a comprehensive MRSA colonization surveillance program. It is not intended to diagnose MRSA infection nor to guide or monitor treatment for MRSA infections. Test performance is not FDA approved in patients less than 54 years old. Performed at [facility], [address] [place], [place] [address]   Culture, blood (Routine X 2) w Reflex to ID Panel     Status: None (Preliminary result)   Collection Time: 12/07/23 12:50 PM   Specimen: BLOOD  Result Value Ref Range Status   Specimen Description BLOOD RIGHT ANTECUBITAL  Final   Special Requests   Final    BOTTLES DRAWN AEROBIC AND ANAEROBIC Blood Culture adequate volume   Culture   Final    NO GROWTH < 24  HOURS Performed at [facility], [address] [place], [place] [address]    Report Status  PENDING  Incomplete  Culture, blood (Routine X 2) w Reflex to ID Panel     Status: None (Preliminary result)   Collection Time: 12/07/23 12:50 PM   Specimen: BLOOD  Result Value Ref Range Status   Specimen Description BLOOD LEFT ANTECUBITAL  Final   Special Requests   Final    BOTTLES DRAWN AEROBIC AND ANAEROBIC Blood Culture adequate volume   Culture   Final    NO GROWTH < 24 HOURS Performed at [facility], [address] [place], [place] [address]    Report Status PENDING  Incomplete    Coagulation Studies: No results for input(s): LABPROT, INR in the last 72 hours.  Urinalysis: No results for input(s): COLORURINE, LABSPEC, PHURINE, GLUCOSEU, HGBUR, BILIRUBINUR, KETONESUR, PROTEINUR, UROBILINOGEN, NITRITE, LEUKOCYTESUR in the last 72 hours.  Invalid input(s): APPERANCEUR     Imaging: ECHOCARDIOGRAM LIMITED Result Date: 12/07/2023    ECHOCARDIOGRAM LIMITED REPORT   Patient Name:   Mckenzie Lawrence Date of Exam: 12/07/2023 Medical Rec #:  [id number]      Height:       67.0 in Accession #:    [id number]     Weight:       171.1 lb Date of Birth:  [birth year]      BSA:          1.892 m Patient Age:    78 years       BP:           147/56 mmHg Patient Gender: F              HR:           62 bpm. Exam Location:  [facility] Procedure: Limited Echo (Both Spectral and Color Flow Doppler were utilized            during procedure). Indications:     Bacteremia R78.81  History:         Patient has prior history of Echocardiogram examinations, most                  recent 11/29/2023.  Sonographer:     Thedora Louder RDCS, FASE Referring Phys:  [phone] DWAYNE D CALLWOOD Diagnosing Phys: Cara JONETTA Lovelace MD  Sonographer Comments: Limited 2D echo requested no spectral or color flow doppler performed on this exam. IMPRESSIONS  1. Aotic valve valve slightly thickened.  2.  Mitral valve wth mild thickening.  3. No obvious vegataion seen.  4. Recommed TEE.  5. Left ventricular ejection fraction, by estimation, is 60 to 65%. The left ventricle has normal function. The left ventricle has no regional wall motion abnormalities. Left ventricular diastolic function could not be evaluated.  6. Right ventricular systolic function is normal. The right ventricular size is normal.  7. The mitral valve is normal in structure.  8. The aortic valve is grossly normal. Aortic valve sclerosis is present, with no evidence of aortic valve stenosis. Conclusion(s)/Recommendation(s): Poor windows for evaluation of left ventricular function by transthoracic echocardiography. Would recommend an alternative means of evaluation. No evidence of valvular vegetations on this transthoracic echocardiogram. Consider a transesophageal echocardiogram to exclude infective endocarditis if clinically indicated. FINDINGS  Left Ventricle: Left ventricular ejection fraction, by estimation, is 60 to 65%. The left ventricle has normal function. The left ventricle has no regional wall motion abnormalities. The left ventricular internal cavity size was normal in size. There is  no left ventricular hypertrophy. Left ventricular diastolic function could not be evaluated. Right Ventricle: The right  ventricular size is normal. No increase in right ventricular wall thickness. Right ventricular systolic function is normal. Left Atrium: Left atrial size was normal in size. Right Atrium: Right atrial size was normal in size. Pericardium: There is no evidence of pericardial effusion. Mitral Valve: The mitral valve is normal in structure. There is mild thickening of the mitral valve leaflet(s). There is mild calcification of the mitral valve leaflet(s). Normal mobility of the mitral valve leaflets. Tricuspid Valve: The tricuspid valve is normal in structure. Aortic Valve: The aortic valve is grossly normal. Aortic valve sclerosis is  present, with no evidence of aortic valve stenosis. Pulmonic Valve: The pulmonic valve was normal in structure. Aorta: The ascending aorta was not well visualized. Additional Comments: No obvious vegataion seen. Aotic valve valve slightly thickened. Mitral valve wth mild thickening. Recommed TEE.  Dwayne D Callwood MD Electronically signed by Cara JONETTA Lovelace MD Signature Date/Time: 12/07/2023/1:28:01 PM    Final       Medications:    anticoagulant sodium citrate     diltiazem (CARDIZEM) infusion 5 mg/hr (12/08/23 0841)   vancomycin       apixaban  5 mg Oral BID   aspirin  EC  81 mg Oral QHS   Chlorhexidine  Gluconate Cloth  6 each Topical Q0600   cloNIDine   0.2 mg Oral BID   epoetin  alfa-epbx (RETACRIT ) injection  4,000 Units Intravenous Q T,Th,Sat-1800   insulin  aspart  0-5 Units Subcutaneous QHS   insulin  aspart  0-9 Units Subcutaneous TID WC   insulin  aspart  3 Units Subcutaneous TID WC   insulin  glargine-yfgn  10 Units Subcutaneous QHS   lactose free nutrition  237 mL Oral TID WC   lactulose   30 g Oral Once   lidocaine   10 mL Intradermal Once   losartan   100 mg Oral QHS   metoprolol  succinate  100 mg Oral QHS   multivitamin  1 tablet Oral QHS   polyethylene glycol  17 g Oral Daily   acetaminophen  **OR** acetaminophen , albuterol , anticoagulant sodium citrate, guaiFENesin -dextromethorphan , heparin , hydrALAZINE , ondansetron  **OR** ondansetron  (ZOFRAN ) IV, oxyCODONE -acetaminophen , sucralfate , vancomycin , zolpidem   Assessment/ Plan:  [name] is a 78 y.o.  female  with past medical history of hypertension, long-standing diabetes mellitus type 2, lower extremity edema, hyperlipidemia, tobacco abuse, prior history of CVA, COPD, obstructive sleep apnea, lumbar spinal fusion, left upper lobe lung mass who presents with volume overload and increasing shortness of breath.   1.  Acute kidney injury/chronic kidney disease stage IV/diabetes mellitus type 2 with chronic kidney  disease/proteinuria/MRSA bacteremia.  eGFR down to 11.  Baseline creatinine 2.7 in February 2025.  Creatinine increased to 3.6-4 in June 2025. - PermCath has been placed  (12/01/2023) - Patient last underwent dialysis treatment on Tuesday.  Hopefully PermCath can be replaced tomorrow.  We are planning for hemodialysis treatment again tomorrow on 12/09/2023. -MRSA bacteremia noted.  PermCath removed 12/06/2023. -Outpatient dialysis seat at [facility] has been secured, first treatment 12/09/2023.  Patient to arrive at 11:30 AM.  Lab Results  Component Value Date   CREATININE 2.65 (H) 12/07/2023   CREATININE 3.03 (H) 12/06/2023   CREATININE 2.59 (H) 12/04/2023    No intake or output data in the 24 hours ending 12/08/23 1020     2.  Hyponatremia.  Differential includes underlying emphysema, lung nodules, AKI Sodium improved to 132 at last check.  3.  Hyperkalemia Now resolved with serum potassium of 3.5.   4.  HTN - 2D echo from 11/29/2023  shows LVEF 55 to 60%, grade 2 diastolic dysfunction, trivial mitral regurgitation Continue clonidine , losartan , and metoprolol .   5.  Anemia of chronic kidney disease.  Hemoglobin down to 7.1 at last check.  Patient has  left upper lobe mass concerning for malignancy.  Discussed with Dr. Lamon.  Okay to use ESA.  Consider transfusion for HgB<7     LOS: 10 Ormond Lazo 6/23/202510:20 AM

## 2023-12-08 NOTE — Consult Note (Signed)
 Infectious Disease     Reason for Consult: MRSA bacteremia   Referring Physician: Dr Kandis Date of Admission:  11/28/2023   Principal Problem:   Hypertensive urgency/emergency Active Problems:   Hyponatremia   CAD (coronary artery disease)   COPD with acute exacerbation (HCC)   Acute renal failure superimposed on stage 4 chronic kidney disease (HCC)   Chest pain   Hypertensive emergency   Acute dyspnea   Encounter for dialysis catheter care [facility])   HPI: Mckenzie Lawrence is a 78 y.o. female with a complicated medical history including COPD CVA chronic kidney disease esophageal dysphagia diabetes hypertension hyperlipidemia admitted with shortness of breath and chest pressure on June 13.  On admission she was found to have markedly elevated blood pressure, white count of 11,000, no fevers.  BNP was 2400 and creatinine was 3.59.  Chest x-ray showed small right effusion. She had permacath placed June 16.  Started on hemodialysis.  She spiked a fever to 102.4 June 19 and blood cultures from June 19 are growing MRSA bacteremia and the permacath was removed June 21.  Tipc cx NGTD She has been on vancomycin .  Follow-up blood culture June 22 is negative. TEE done 6/23 is negative  With regards to her prior MRSA bacteremia in February 2024 this was from a postop wound infection after the L3-L4 fusion surgery and required I&D.  It was not felt that hardware was involved.  Family did not want her to have IV antibiotics at home due to her caring for her elderly husband  so she was treated with weekly long-acting antibiotics with dalbavancin with 3 doses. She had another admission February 1 through 8 2025 with respiratory symptoms and was diagnosed with community-acquired pneumonia but her cultures came back again MRSA 3 of 4 bottles.  She had a TEE which was negative.  MRI of the lumbar spine did not show any discitis or osteomyelitis.  Again family did not want a PICC line and home IV antibiotics so she was  discharged on p.o. linezolid  for 4 weeks however she did not tolerate the full course and was instead given a dose of dalbavancin to finish treatment.  Currently denies pain or fevers. She did have some increase back pain over last few weeks and had seen her NS and had MRI day of admission at Dr Mavis office and told negative for anything major. Back pain is stabilizing to improving since admit although she has not been moving much     Past Medical History:  Diagnosis Date   Anemia    Arthritis    Bladder incontinence    Broken foot    Cataracts, bilateral    Chronic kidney insufficiency    stage 3b   COPD (chronic obstructive pulmonary disease) (HCC)    wheezing   Coronary artery disease 04/25/2022   in [place]   COVID 2021   very mild case   CVA (cerebral vascular accident) (HCC) 2016   has had 3 strokes, states right side is slightlyweaker than left   Diabetes mellitus    insulin  dependent, Type 2   GERD (gastroesophageal reflux disease)    HLD (hyperlipidemia)    Hx of cardiovascular stress test    a. ETT (6/13):  Ex 5:13; no ischemic changes   Hypertension    controlled on meds   Lacunar stroke of left subthalamic region (HCC) 02/2015   Leg pain    left   Lower back pain    Neuromuscular disorder (HCC)  stroke right hand tingling   Orthostatic hypotension    Osteopenia 01/2017   T score -2.0 stable from prior DEXA   Pancreatitis 10/2021   PCOS (polycystic ovarian syndrome)    Personal history of tobacco use, presenting hazards to health 01/09/2015   PONV (postoperative nausea and vomiting)    Sleep apnea    uses CPAP nightly   Past Surgical History:  Procedure Laterality Date   BIOPSY  08/28/2022   Procedure: BIOPSY;  Surgeon: Elicia Claw, MD;  Location: [facility];  Service: Gastroenterology;;   BOTOX  INJECTION  01/15/2022   Procedure: BOTOX  INJECTION;  Surgeon: Rosalie Kitchens, MD;  Location: [facility];  Service: Gastroenterology;;   BOTOX  INJECTION  N/A 08/28/2022   Procedure: BOTOX  INJECTION;  Surgeon: Elicia Claw, MD;  [facility]: [facility];  Service: Gastroenterology;  Laterality: N/A;   BOTOX  INJECTION Bilateral 05/27/2023   Procedure: BOTOX  INJECTION;  Surgeon: Rosalie Kitchens, MD;  Location: [facility];  Service: Gastroenterology;  Laterality: Bilateral;   BROW LIFT Bilateral 11/04/2017   Procedure: BLEPHAROPLASTY UPPER EYELID W/EXCESS SKIN;  Surgeon: Ashley Greig HERO, MD;  Location: [facility];  Service: Ophthalmology;  Laterality: Bilateral;  DIABETES-insulin  dependent uses CPAP   CARDIAC CATHETERIZATION  20 yrs ago   found nothing   CARPAL TUNNEL RELEASE Bilateral    CATARACT EXTRACTION Bilateral    DIALYSIS/PERMA CATHETER INSERTION N/A 12/01/2023   Procedure: DIALYSIS/PERMA CATHETER INSERTION;  Surgeon: Marea Selinda RAMAN, MD;  Location: [facility];  Service: Cardiovascular;  Laterality: N/A;   ELBOW SURGERY Bilateral    ESOPHAGOGASTRODUODENOSCOPY N/A 07/25/2021   Procedure: ESOPHAGOGASTRODUODENOSCOPY (EGD);  Surgeon: Toledo, Ladell POUR, MD;  Location: [facility];  Service: Gastroenterology;  Laterality: N/A;  IDDM   ESOPHAGOGASTRODUODENOSCOPY N/A 01/15/2022   Procedure: ESOPHAGOGASTRODUODENOSCOPY (EGD);  Surgeon: Rosalie Kitchens, MD;  Location: [facility];  Service: Gastroenterology;  Laterality: N/A;  botox    ESOPHAGOGASTRODUODENOSCOPY (EGD) WITH PROPOFOL  N/A 08/28/2022   Procedure: ESOPHAGOGASTRODUODENOSCOPY (EGD) WITH PROPOFOL ;  Surgeon: Elicia Claw, MD;  [facility];  Service: Gastroenterology;  Laterality: N/A;   ESOPHAGOGASTRODUODENOSCOPY (EGD) WITH PROPOFOL  Bilateral 05/27/2023   Procedure: ESOPHAGOGASTRODUODENOSCOPY (EGD) WITH PROPOFOL ;  Surgeon: Rosalie Kitchens, MD;  [facility];  Service: Gastroenterology;  Laterality: Bilateral;   FOOT SURGERY     Groin Abscess     HAND SURGERY     KNEE SURGERY Bilateral    LABIAL ABSCESS     LUMBAR LAMINECTOMY/DECOMPRESSION MICRODISCECTOMY Left  05/11/2018   Procedure: LUMBAR LAMINECTOMY/DECOMPRESSION MICRODISCECTOMY 1 LEVEL- L4-5;  Surgeon: Bluford Standing, MD;  Location: [facility];  Service: Neurosurgery;  Laterality: Left;   LUMBAR LAMINECTOMY/DECOMPRESSION MICRODISCECTOMY Left 05/20/2022   Procedure: MICRODISCECTOMY L3-4;  Surgeon: Mavis Purchase, MD;  Location[facility];  Service: Neurosurgery;  Laterality: Left;  3C   LUMBAR WOUND DEBRIDEMENT N/A 08/08/2022   Procedure: INCISION AND DRAINAGE OF LUMBAR WOUND;  Surgeon: Mavis Purchase, MD;  Location: [facility] OR;  Service: Neurosurgery;  Laterality: N/A;  3C   OOPHORECTOMY     BSO   PUBO VAG SLING     SHOULDER SURGERY     bilateral arthroscopies   TEE WITHOUT CARDIOVERSION N/A 07/25/2023   Procedure: TRANSESOPHAGEAL ECHOCARDIOGRAM (TEE);  Surgeon: Hilarie Rocher, MD;  Location: [facility];  Service: Cardiovascular;  Laterality: N/A;   VAGINAL HYSTERECTOMY  1979   Social History   Tobacco Use   Smoking status: Every Day    Current packs/day: 1.50    Average packs/day: 1.5 packs/day for 50.0 years (75.0 ttl pk-yrs)  Types: Cigarettes   Smokeless tobacco: Never   Tobacco comments:    3ppd x 10 year, then cut back to 1.5pdd since 02/2015  Vaping Use   Vaping status: Never Used  Substance Use Topics   Alcohol use: Never    Alcohol/week: 0.0 standard drinks of alcohol   Drug use: No   Family History  Problem Relation Age of Onset   Hypertension Mother    Heart disease Mother 80       MI   Diabetes Father    Diabetes Sister    Hypertension Sister    Diabetes Brother    Hypertension Brother    Heart disease Brother 66       CAD   Cancer Sister        Multiple myloma   Diabetes Brother     Allergies:  Allergies  Allergen Reactions   Iodine Anaphylaxis and Other (See Comments)    Pt states that she is allergic to ingested iodine only, okay for betadine.     Iodine I-131 Tositumomab Anaphylaxis   Shellfish Allergy Anaphylaxis   Codeine Nausea And Vomiting   Morphine   Sulfate Nausea And Vomiting   Irbesartan Other (See Comments)     Unknown  (Avapro)   Sulfa Antibiotics Other (See Comments)    Fever     Current antibiotics: Antibiotics Given (last 72 hours)     Date/Time Action Medication Dose Rate   12/06/23 0929 New Bag/Given   vancomycin  (VANCOREADY) IVPB 1500 mg/300 mL 1,500 mg 150 mL/hr       MEDICATIONS:  apixaban  5 mg Oral BID   aspirin  EC  81 mg Oral QHS   Chlorhexidine  Gluconate Cloth  6 each Topical Q0600   cloNIDine   0.2 mg Oral BID   diltiazem  60 mg Oral Q6H   epoetin  alfa-epbx (RETACRIT ) injection  4,000 Units Intravenous Q T,Th,Sat-1800   insulin  aspart  0-5 Units Subcutaneous QHS   insulin  aspart  0-9 Units Subcutaneous TID WC   insulin  aspart  3 Units Subcutaneous TID WC   insulin  glargine-yfgn  10 Units Subcutaneous QHS   lactose free nutrition  237 mL Oral TID WC   lactulose   30 g Oral Once   lidocaine   10 mL Intradermal Once   lidocaine        losartan   100 mg Oral QHS   metoprolol  succinate  100 mg Oral QHS   multivitamin  1 tablet Oral QHS   polyethylene glycol  17 g Oral Daily   vancomycin  variable dose per unstable renal function (pharmacist dosing)   Does not apply See admin instructions    Review of Systems - 11 systems reviewed and negative per HPI   OBJECTIVE: Temp:  [98.1 F (36.7 C)-99.3 F (37.4 C)] 98.5 F (36.9 C) (06/23 1430) Pulse Rate:  [42-113] 65 (06/23 1415) Resp:  [14-35] 20 (06/23 1415) BP: (118-156)/(43-79) 132/50 (06/23 1415) SpO2:  [76 %-100 %] 98 % (06/23 1415) Weight:  [78.8 kg] 78.8 kg (06/23 0500) Physical Exam  Constitutional:  frail, alert and interactive HENT: [place]/AT, PERRLA, no scleral icterus Mouth/Throat: Oropharynx is clear and moist. No oropharyngeal exudate.  Cardiovascular: Normal rate, regular rhythm and normal heart sounds.  Pulmonary/Chest: Effort normal and breath sounds normal. No respiratory distress.  has no wheezes.  R chest wall prior HD cath site covered.  Non tender Neck = supple, no nuchal rigidity Abdominal: Soft. Bowel sounds are normal.  exhibits no distension. There is no tenderness.  Lymphadenopathy: no  cervical adenopathy. No axillary adenopathy Neurological: alert and oriented to person, place, and time.  Skin: Skin is warm and dry. No rash noted. No erythema.  Psychiatric: a normal mood and affect.  behavior is normal.    LABS: Results for orders placed or performed during the hospital encounter of 11/28/23 (from the past 48 hours)  Glucose, capillary     Status: Abnormal   Collection Time: 12/06/23  4:21 PM  Result Value Ref Range   Glucose-Capillary 261 (H) 70 - 99 mg/dL    Comment: Glucose reference range applies only to samples taken after fasting for at least 8 hours.  Glucose, capillary     Status: Abnormal   Collection Time: 12/06/23  8:42 PM  Result Value Ref Range   Glucose-Capillary 213 (H) 70 - 99 mg/dL    Comment: Glucose reference range applies only to samples taken after fasting for at least 8 hours.  Vancomycin , trough     Status: Abnormal   Collection Time: 12/07/23  6:57 AM  Result Value Ref Range   Vancomycin  Tr 12 (L) 15 - 20 ug/mL    Comment: Performed at [facility], [address] [place], [place] [address]  Glucose, capillary     Status: Abnormal   Collection Time: 12/07/23  7:42 AM  Result Value Ref Range   Glucose-Capillary 311 (H) 70 - 99 mg/dL    Comment: Glucose reference range applies only to samples taken after fasting for at least 8 hours.  MRSA Next Gen by PCR, Nasal     Status: Abnormal   Collection Time: 12/07/23  9:00 AM   Specimen: Nasal Mucosa; Nasal Swab  Result Value Ref Range   MRSA by PCR Next Gen DETECTED (A) NOT DETECTED    Comment: RESULT CALLED TO, READ BACK BY AND VERIFIED WITH: ELIZABETH HAYS 12/07/23 1041 SLM (NOTE) The GeneXpert MRSA Assay (FDA approved for NASAL specimens only), is one component of a comprehensive MRSA colonization surveillance program. It is  not intended to diagnose MRSA infection nor to guide or monitor treatment for MRSA infections. Test performance is not FDA approved in patients less than 107 years old. Performed at [facility], [address] [place], [place] [address]   Glucose, capillary     Status: Abnormal   Collection Time: 12/07/23 12:03 PM  Result Value Ref Range   Glucose-Capillary 243 (H) 70 - 99 mg/dL    Comment: Glucose reference range applies only to samples taken after fasting for at least 8 hours.  Renal function panel     Status: Abnormal   Collection Time: 12/07/23 12:19 PM  Result Value Ref Range   Sodium 132 (L) 135 - 145 mmol/L   Potassium 3.5 3.5 - 5.1 mmol/L   Chloride 94 (L) 98 - 111 mmol/L   CO2 27 22 - 32 mmol/L   Glucose, Bld 238 (H) 70 - 99 mg/dL    Comment: Glucose reference range applies only to samples taken after fasting for at least 8 hours.   BUN 28 (H) 8 - 23 mg/dL   Creatinine, Ser 7.34 (H) 0.44 - 1.00 mg/dL   Calcium  7.7 (L) 8.9 - 10.3 mg/dL   Phosphorus 2.8 2.5 - 4.6 mg/dL   Albumin 1.8 (L) 3.5 - 5.0 g/dL   GFR, Estimated 18 (L) >60 mL/min    Comment: (NOTE) Calculated using the CKD-EPI Creatinine Equation (2021)    Anion gap 11 5 - 15    Comment: Performed at [facility],  [address], [place], [place] [address]  CBC     Status: Abnormal   Collection Time: 12/07/23 12:19 PM  Result Value Ref Range   WBC 13.2 (H) 4.0 - 10.5 K/uL   RBC 2.30 (L) 3.87 - 5.11 MIL/uL   Hemoglobin 7.1 (L) 12.0 - 15.0 g/dL   HCT 78.3 (L) 63.9 - 53.9 %   MCV 93.9 80.0 - 100.0 fL   MCH 30.9 26.0 - 34.0 pg   MCHC 32.9 30.0 - 36.0 g/dL   RDW 85.5 88.4 - 84.4 %   Platelets 150 150 - 400 K/uL   nRBC 0.0 0.0 - 0.2 %    Comment: Performed at [facility], [address] [place], [place] [address]  Culture, blood (Routine X 2) w Reflex to ID Panel     Status: None (Preliminary result)   Collection Time: 12/07/23 12:50 PM   Specimen: BLOOD  Result Value Ref Range    Specimen Description BLOOD RIGHT ANTECUBITAL    Special Requests      BOTTLES DRAWN AEROBIC AND ANAEROBIC Blood Culture adequate volume   Culture      NO GROWTH < 24 HOURS Performed at [facility], [address] [place], [place] [address]    Report Status PENDING   Culture, blood (Routine X 2) w Reflex to ID Panel     Status: None (Preliminary result)   Collection Time: 12/07/23 12:50 PM   Specimen: BLOOD  Result Value Ref Range   Specimen Description BLOOD LEFT ANTECUBITAL    Special Requests      BOTTLES DRAWN AEROBIC AND ANAEROBIC Blood Culture adequate volume   Culture      NO GROWTH < 24 HOURS Performed at [facility], [address] [place], [place] [address]    Report Status PENDING   Glucose, capillary     Status: Abnormal   Collection Time: 12/07/23  4:41 PM  Result Value Ref Range   Glucose-Capillary 317 (H) 70 - 99 mg/dL    Comment: Glucose reference range applies only to samples taken after fasting for at least 8 hours.  Glucose, capillary     Status: Abnormal   Collection Time: 12/07/23 10:23 PM  Result Value Ref Range   Glucose-Capillary 185 (H) 70 - 99 mg/dL    Comment: Glucose reference range applies only to samples taken after fasting for at least 8 hours.  Vancomycin , random     Status: None   Collection Time: 12/08/23  3:46 AM  Result Value Ref Range   Vancomycin  Rm 9 ug/mL    Comment:        Random Vancomycin  therapeutic range is dependent on dosage and time of specimen collection. A peak range is 20-40 ug/mL A trough range is 5-15 ug/mL        Performed at [facility], [address] [place], [place] [address]   Glucose, capillary     Status: Abnormal   Collection Time: 12/08/23  7:45 AM  Result Value Ref Range   Glucose-Capillary 186 (H) 70 - 99 mg/dL    Comment: Glucose reference range applies only to samples taken after fasting for at least 8 hours.  Glucose, capillary     Status: Abnormal   Collection  Time: 12/08/23 11:18 AM  Result Value Ref Range   Glucose-Capillary 204 (H) 70 - 99 mg/dL    Comment: Glucose reference range applies only to samples taken after fasting for at least 8 hours.   No  components found for: ESR, C REACTIVE PROTEIN MICRO: Recent Results (from the past 720 hours)  Culture, blood (Routine X 2) w Reflex to ID Panel     Status: Abnormal   Collection Time: 12/04/23  6:34 PM   Specimen: BLOOD  Result Value Ref Range Status   Specimen Description   Final    BLOOD BLOOD RIGHT ARM Performed at [facility], [address] [place], [place] [address]    Special Requests   Final    BOTTLES DRAWN AEROBIC AND ANAEROBIC Blood Culture adequate volume Performed at [facility], [address] [place], [place] [address]    Culture  Setup Time   Final    Organism ID to follow IN BOTH AEROBIC AND ANAEROBIC BOTTLES GRAM POSITIVE COCCI CRITICAL RESULT CALLED TO, READ BACK BY AND VERIFIED WITH: JASON ROBBINS 12/06/2023 AT 9351 SRR Performed at [facility], [address] [place], [place] [address]    Culture METHICILLIN RESISTANT STAPHYLOCOCCUS AUREUS (A)  Final   Report Status 12/08/2023 FINAL  Final   Organism ID, Bacteria METHICILLIN RESISTANT STAPHYLOCOCCUS AUREUS  Final      Susceptibility   Methicillin resistant staphylococcus aureus - MIC*    CIPROFLOXACIN  >=8 RESISTANT Resistant     ERYTHROMYCIN  >=8 RESISTANT Resistant     GENTAMICIN  <=0.5 SENSITIVE Sensitive     OXACILLIN >=4 RESISTANT Resistant     TETRACYCLINE <=1 SENSITIVE Sensitive     VANCOMYCIN  2 SENSITIVE Sensitive     TRIMETH/SULFA <=10 SENSITIVE Sensitive     CLINDAMYCIN <=0.25 SENSITIVE Sensitive     RIFAMPIN <=0.5 SENSITIVE Sensitive     Inducible Clindamycin NEGATIVE Sensitive     LINEZOLID  2 SENSITIVE Sensitive     * METHICILLIN RESISTANT STAPHYLOCOCCUS AUREUS  Culture, blood (Routine X 2) w Reflex to ID Panel     Status: Abnormal   Collection Time: 12/04/23   6:34 PM   Specimen: BLOOD  Result Value Ref Range Status   Specimen Description   Final    BLOOD BLOOD LEFT ARM Performed at [facility], [address], [place], [place] [address]    Special Requests   Final    BOTTLES DRAWN AEROBIC AND ANAEROBIC Blood Culture adequate volume Performed at [facility], [address] [place], [place] [address]    Culture  Setup Time   Final    GRAM POSITIVE COCCI IN BOTH AEROBIC AND ANAEROBIC BOTTLES CRITICAL VALUE NOTED.  VALUE IS CONSISTENT WITH PREVIOUSLY REPORTED AND CALLED VALUE. Performed at [facility], [address] [place], [place] [address]    Culture (A)  Final    STAPHYLOCOCCUS AUREUS SUSCEPTIBILITIES PERFORMED ON PREVIOUS CULTURE WITHIN THE LAST 5 DAYS. Performed at [facility], [address], [place], [place] [address]    Report Status 12/08/2023 FINAL  Final  Blood Culture ID Panel (Reflexed)     Status: Abnormal   Collection Time: 12/04/23  6:34 PM  Result Value Ref Range Status   Enterococcus faecalis NOT DETECTED NOT DETECTED Final   Enterococcus Faecium NOT DETECTED NOT DETECTED Final   Listeria monocytogenes NOT DETECTED NOT DETECTED Final   Staphylococcus species DETECTED (A) NOT DETECTED Final    Comment: CRITICAL RESULT CALLED TO, READ BACK BY AND VERIFIED WITH: JASON ROBBINS 12/06/2023 AT 0648 SRR    Staphylococcus aureus (BCID) DETECTED (A) NOT DETECTED Final    Comment: Methicillin (oxacillin)-resistant Staphylococcus aureus (MRSA). MRSA is predictably resistant to beta-lactam antibiotics (except ceftaroline). Preferred therapy  is vancomycin  unless clinically contraindicated. Patient requires contact precautions if  hospitalized. CRITICAL RESULT CALLED TO, READ BACK BY AND VERIFIED WITH: JASON ROBBINS 12/06/2023 AT 0648 SRR    Staphylococcus epidermidis NOT DETECTED NOT DETECTED Final   Staphylococcus lugdunensis NOT DETECTED NOT DETECTED Final   Streptococcus species NOT  DETECTED NOT DETECTED Final   Streptococcus agalactiae NOT DETECTED NOT DETECTED Final   Streptococcus pneumoniae NOT DETECTED NOT DETECTED Final   Streptococcus pyogenes NOT DETECTED NOT DETECTED Final   A.calcoaceticus-baumannii NOT DETECTED NOT DETECTED Final   Bacteroides fragilis NOT DETECTED NOT DETECTED Final   Enterobacterales NOT DETECTED NOT DETECTED Final   Enterobacter cloacae complex NOT DETECTED NOT DETECTED Final   Escherichia coli NOT DETECTED NOT DETECTED Final   Klebsiella aerogenes NOT DETECTED NOT DETECTED Final   Klebsiella oxytoca NOT DETECTED NOT DETECTED Final   Klebsiella pneumoniae NOT DETECTED NOT DETECTED Final   Proteus species NOT DETECTED NOT DETECTED Final   Salmonella species NOT DETECTED NOT DETECTED Final   Serratia marcescens NOT DETECTED NOT DETECTED Final   Haemophilus influenzae NOT DETECTED NOT DETECTED Final   Neisseria meningitidis NOT DETECTED NOT DETECTED Final   Pseudomonas aeruginosa NOT DETECTED NOT DETECTED Final   Stenotrophomonas maltophilia NOT DETECTED NOT DETECTED Final   Candida albicans NOT DETECTED NOT DETECTED Final   Candida auris NOT DETECTED NOT DETECTED Final   Candida glabrata NOT DETECTED NOT DETECTED Final   Candida krusei NOT DETECTED NOT DETECTED Final   Candida parapsilosis NOT DETECTED NOT DETECTED Final   Candida tropicalis NOT DETECTED NOT DETECTED Final   Cryptococcus neoformans/gattii NOT DETECTED NOT DETECTED Final   Meth resistant mecA/C and MREJ DETECTED (A) NOT DETECTED Final    Comment: CRITICAL RESULT CALLED TO, READ BACK BY AND VERIFIED WITH: JASON ROBBINS 12/06/2023 AT 9351 SRR Performed at [facility], [address] [place], [place] [address]   SARS Coronavirus 2 by RT PCR (hospital order, performed in [facility] hospital lab) *cepheid single result test* Anterior Nasal Swab     Status: None   Collection Time: 12/04/23  8:40 PM   Specimen: Anterior Nasal Swab  Result Value Ref Range Status    SARS Coronavirus 2 by RT PCR NEGATIVE NEGATIVE Final    Comment: (NOTE) SARS-CoV-2 target nucleic acids are NOT DETECTED.  The SARS-CoV-2 RNA is generally detectable in upper and lower respiratory specimens during the acute phase of infection. The lowest concentration of SARS-CoV-2 viral copies this assay can detect is 250 copies / mL. A negative result does not preclude SARS-CoV-2 infection and should not be used as the sole basis for treatment or other patient management decisions.  A negative result may occur with improper specimen collection / handling, submission of specimen other than nasopharyngeal swab, presence of viral mutation(s) within the areas targeted by this assay, and inadequate number of viral copies (<250 copies / mL). A negative result must be combined with clinical observations, patient history, and epidemiological information.  Fact Sheet for Patients:   RoadLapTop.co.za  Fact Sheet for Healthcare Providers: http://kim-miller.com/  This test is not yet approved or  cleared by the [place]  FDA and has been authorized for detection and/or diagnosis of SARS-CoV-2 by FDA under an Emergency Use Authorization (EUA).  This EUA will remain in effect (meaning this test can be used) for the duration of the COVID-19 declaration under Section 564(b)(1) of the Act, 21 U.S.C. section 360bbb-3(b)(1), unless the authorization is terminated or revoked sooner.  Performed at  [facility], [address] [place], [place] [address]   Urine Culture (for pregnant, neutropenic or urologic patients or patients with an indwelling urinary catheter)     Status: Abnormal   Collection Time: 12/04/23  8:45 PM   Specimen: Urine, Clean Catch  Result Value Ref Range Status   Specimen Description   Final    URINE, CLEAN CATCH Performed at [facility], [address], [place], [place] [address]    Special Requests    Final    NONE Performed at [facility], [address] [place], [place] [address]    Culture (A)  Final    <10,000 COLONIES/mL INSIGNIFICANT GROWTH Performed at [facility], [address], [place], [place] [address]    Report Status 12/06/2023 FINAL  Final  Respiratory (~20 pathogens) panel by PCR     Status: None   Collection Time: 12/04/23 11:30 PM   Specimen: Nasopharyngeal Swab; Respiratory  Result Value Ref Range Status   Adenovirus NOT DETECTED NOT DETECTED Final   Coronavirus 229E NOT DETECTED NOT DETECTED Final    Comment: (NOTE) The Coronavirus on the Respiratory Panel, DOES NOT test for the novel  Coronavirus (2019 nCoV)    Coronavirus HKU1 NOT DETECTED NOT DETECTED Final   Coronavirus NL63 NOT DETECTED NOT DETECTED Final   Coronavirus OC43 NOT DETECTED NOT DETECTED Final   Metapneumovirus NOT DETECTED NOT DETECTED Final   Rhinovirus / Enterovirus NOT DETECTED NOT DETECTED Final   Influenza A NOT DETECTED NOT DETECTED Final   Influenza B NOT DETECTED NOT DETECTED Final   Parainfluenza Virus 1 NOT DETECTED NOT DETECTED Final   Parainfluenza Virus 2 NOT DETECTED NOT DETECTED Final   Parainfluenza Virus 3 NOT DETECTED NOT DETECTED Final   Parainfluenza Virus 4 NOT DETECTED NOT DETECTED Final   Respiratory Syncytial Virus NOT DETECTED NOT DETECTED Final   Bordetella pertussis NOT DETECTED NOT DETECTED Final   Bordetella Parapertussis NOT DETECTED NOT DETECTED Final   Chlamydophila pneumoniae NOT DETECTED NOT DETECTED Final   Mycoplasma pneumoniae NOT DETECTED NOT DETECTED Final    Comment: Performed at [facility], [address], [place], [place] [address]  Cath Tip Culture     Status: None (Preliminary result)   Collection Time: 12/06/23  2:45 PM   Specimen: Catheter Tip; Other  Result Value Ref Range Status   Specimen Description   Final    CATH TIP Performed at [facility], [address], [place], [place] [address]     Special Requests   Final    NONE Performed at [facility], [address] [place], [place] [address]    Culture   Final    NO GROWTH < 12 HOURS Performed at [facility], [address], [place], [place] [address]    Report Status PENDING  Incomplete  MRSA Next Gen by PCR, Nasal     Status: Abnormal   Collection Time: 12/07/23  9:00 AM   Specimen: Nasal Mucosa; Nasal Swab  Result Value Ref Range Status   MRSA by PCR Next Gen DETECTED (A) NOT DETECTED Final    Comment: RESULT CALLED TO, READ BACK BY AND VERIFIED WITH: ELIZABETH HAYS 12/07/23 1041 SLM (NOTE) The GeneXpert MRSA Assay (FDA approved for NASAL specimens only), is one component of a comprehensive MRSA colonization surveillance program. It is not intended to diagnose MRSA infection nor to guide or monitor treatment for MRSA infections. Test performance is not FDA  approved in patients less than 20 years old. Performed at [facility], [address] [place], [place] [address]   Culture, blood (Routine X 2) w Reflex to ID Panel     Status: None (Preliminary result)   Collection Time: 12/07/23 12:50 PM   Specimen: BLOOD  Result Value Ref Range Status   Specimen Description BLOOD RIGHT ANTECUBITAL  Final   Special Requests   Final    BOTTLES DRAWN AEROBIC AND ANAEROBIC Blood Culture adequate volume   Culture   Final    NO GROWTH < 24 HOURS Performed at [facility], [address] [place], [place] [address]    Report Status PENDING  Incomplete  Culture, blood (Routine X 2) w Reflex to ID Panel     Status: None (Preliminary result)   Collection Time: 12/07/23 12:50 PM   Specimen: BLOOD  Result Value Ref Range Status   Specimen Description BLOOD LEFT ANTECUBITAL  Final   Special Requests   Final    BOTTLES DRAWN AEROBIC AND ANAEROBIC Blood Culture adequate volume   Culture   Final    NO GROWTH < 24 HOURS Performed at [facility], [address] [place], [place]  [address]    Report Status PENDING  Incomplete    IMAGING: ECHOCARDIOGRAM LIMITED Result Date: 12/07/2023    ECHOCARDIOGRAM LIMITED REPORT   Patient Name:   Mckenzie Lawrence Date of Exam: 12/07/2023 Medical Rec #:  [id number]      Height:       67.0 in Accession #:    [id number]     Weight:       171.1 lb Date of Birth:  11-09-1945      BSA:          1.892 m Patient Age:    78 years       BP:           147/56 mmHg Patient Gender: F              HR:           62 bpm. Exam Location:  [facility] Procedure: Limited Echo (Both Spectral and Color Flow Doppler were utilized            during procedure). Indications:     Bacteremia R78.81  History:         Patient has prior history of Echocardiogram examinations, most                  recent 11/29/2023.  Sonographer:     Thedora Louder RDCS, FASE Referring Phys:  [phone] DWAYNE D CALLWOOD Diagnosing Phys: Cara JONETTA Lovelace MD  Sonographer Comments: Limited 2D echo requested no spectral or color flow doppler performed on this exam. IMPRESSIONS  1. Aotic valve valve slightly thickened.  2. Mitral valve wth mild thickening.  3. No obvious vegataion seen.  4. Recommed TEE.  5. Left ventricular ejection fraction, by estimation, is 60 to 65%. The left ventricle has normal function. The left ventricle has no regional wall motion abnormalities. Left ventricular diastolic function could not be evaluated.  6. Right ventricular systolic function is normal. The right ventricular size is normal.  7. The mitral valve is normal in structure.  8. The aortic valve is grossly normal. Aortic valve sclerosis is present, with no evidence of aortic valve stenosis. Conclusion(s)/Recommendation(s): Poor windows for evaluation of left ventricular function by transthoracic echocardiography. Would recommend an alternative means of evaluation. No evidence of valvular vegetations  on this transthoracic echocardiogram. Consider a transesophageal echocardiogram to exclude infective endocarditis if clinically  indicated. FINDINGS  Left Ventricle: Left ventricular ejection fraction, by estimation, is 60 to 65%. The left ventricle has normal function. The left ventricle has no regional wall motion abnormalities. The left ventricular internal cavity size was normal in size. There is  no left ventricular hypertrophy. Left ventricular diastolic function could not be evaluated. Right Ventricle: The right ventricular size is normal. No increase in right ventricular wall thickness. Right ventricular systolic function is normal. Left Atrium: Left atrial size was normal in size. Right Atrium: Right atrial size was normal in size. Pericardium: There is no evidence of pericardial effusion. Mitral Valve: The mitral valve is normal in structure. There is mild thickening of the mitral valve leaflet(s). There is mild calcification of the mitral valve leaflet(s). Normal mobility of the mitral valve leaflets. Tricuspid Valve: The tricuspid valve is normal in structure. Aortic Valve: The aortic valve is grossly normal. Aortic valve sclerosis is present, with no evidence of aortic valve stenosis. Pulmonic Valve: The pulmonic valve was normal in structure. Aorta: The ascending aorta was not well visualized. Additional Comments: No obvious vegataion seen. Aotic valve valve slightly thickened. Mitral valve wth mild thickening. Recommed TEE.  Dwayne D Callwood MD Electronically signed by Cara JONETTA Lovelace MD Signature Date/Time: 12/07/2023/1:28:01 PM    Final    DG Chest Port 1 View Result Date: 12/04/2023 CLINICAL DATA:  Fever EXAM: PORTABLE CHEST 1 VIEW COMPARISON:  11/28/2023 FINDINGS: Right internal jugular hemodialysis catheter tips are seen within the superior vena cava. Lungs appear mildly hyperinflated, stable since prior examination, suggesting changes of underlying COPD. Small right pleural effusion again noted, slightly decreased since prior examination. Focal atelectasis or infiltrate within the right costophrenic angle. Mild  diffuse interstitial thickening is again noted in keeping with interstitial pulmonary edema or airway inflammation. No pneumothorax. Stable cardiomegaly. No acute bone abnormality. IMPRESSION: 1. Stable cardiomegaly. 2. Small right pleural effusion, slightly decreased since prior examination. 3. Stable interstitial thickening in keeping with interstitial pulmonary edema or airway inflammation. Electronically Signed   By: Dorethia Molt M.D.   On: 12/04/2023 20:43   PERIPHERAL VASCULAR CATHETERIZATION Result Date: 12/01/2023 See surgical note for result.  ECHOCARDIOGRAM COMPLETE Result Date: 11/29/2023    ECHOCARDIOGRAM REPORT   Patient Name:   Mckenzie Lawrence Date of Exam: 11/29/2023 Medical Rec #:  [id number]      Height:       67.0 in Accession #:    [id number]     Weight:       176.6 lb Date of Birth:  24-Feb-1946      BSA:          1.918 m Patient Age:    78 years       BP:           163/51 mmHg Patient Gender: F              HR:           81 bpm. Exam Location:  [facility] Procedure: 2D Echo, Cardiac Doppler and Color Doppler (Both Spectral and Color            Flow Doppler were utilized during procedure). Indications:     CHF I50.31  History:         Patient has prior history of Echocardiogram examinations, most                  recent 07/23/2023.  Sonographer:     Thedora Louder RDCS, FASE Referring Phys:  [id number] ANTHONY CHRISTELLA POUCH Diagnosing Phys: Dwayne D Callwood MD IMPRESSIONS  1. Left ventricular ejection fraction, by estimation, is 55 to 60%. The left ventricle has normal function. The left ventricle has no regional wall motion abnormalities. The left ventricular internal cavity size was moderately dilated. Left ventricular diastolic parameters are consistent with Grade II diastolic dysfunction (pseudonormalization).  2. Right ventricular systolic function is normal. The right ventricular size is normal.  3. Left atrial size was mildly dilated.  4. The mitral valve is normal in structure. Trivial mitral  valve regurgitation.  5. The aortic valve is normal in structure. Aortic valve regurgitation is not visualized. FINDINGS  Left Ventricle: Left ventricular ejection fraction, by estimation, is 55 to 60%. The left ventricle has normal function. The left ventricle has no regional wall motion abnormalities. Strain was performed and the global longitudinal strain is indeterminate. The left ventricular internal cavity size was moderately dilated. There is no left ventricular hypertrophy. Left ventricular diastolic parameters are consistent with Grade II diastolic dysfunction (pseudonormalization). Right Ventricle: The right ventricular size is normal. No increase in right ventricular wall thickness. Right ventricular systolic function is normal. Left Atrium: Left atrial size was mildly dilated. Right Atrium: Right atrial size was normal in size. Pericardium: There is no evidence of pericardial effusion. Mitral Valve: The mitral valve is normal in structure. Trivial mitral valve regurgitation. Tricuspid Valve: The tricuspid valve is normal in structure. Tricuspid valve regurgitation is mild. Aortic Valve: The aortic valve is normal in structure. Aortic valve regurgitation is not visualized. Aortic valve peak gradient measures 8.8 mmHg. Pulmonic Valve: The pulmonic valve was normal in structure. Pulmonic valve regurgitation is not visualized. Aorta: The ascending aorta was not well visualized. IAS/Shunts: No atrial level shunt detected by color flow Doppler. Additional Comments: 3D was performed not requiring image post processing on an independent workstation and was indeterminate.  LEFT VENTRICLE PLAX 2D LVIDd:         5.50 cm     Diastology LVIDs:         3.80 cm     LV e' medial:    7.18 cm/s LV PW:         1.00 cm     LV E/e' medial:  19.9 LV IVS:        1.10 cm     LV e' lateral:   8.38 cm/s LVOT diam:     1.80 cm     LV E/e' lateral: 17.1 LV SV:         63 LV SV Index:   33 LVOT Area:     2.54 cm  LV Volumes (MOD)  LV vol d, MOD A2C: 74.1 ml LV vol d, MOD A4C: 70.9 ml LV vol s, MOD A2C: 37.0 ml LV vol s, MOD A4C: 28.8 ml LV SV MOD A2C:     37.1 ml LV SV MOD A4C:     70.9 ml LV SV MOD BP:      41.0 ml RIGHT VENTRICLE RV Basal diam:  3.20 cm RV S prime:     14.80 cm/s TAPSE (M-mode): 2.6 cm LEFT ATRIUM             Index        RIGHT ATRIUM           Index LA diam:        4.30 cm 2.24 cm/m   RA Area:  19.70 cm LA Vol (A2C):   44.9 ml 23.41 ml/m  RA Volume:   55.40 ml  28.88 ml/m LA Vol (A4C):   53.8 ml 28.05 ml/m LA Biplane Vol: 51.9 ml 27.06 ml/m  AORTIC VALVE                 PULMONIC VALVE AV Area (Vmax): 1.84 cm     PV Vmax:        1.01 m/s AV Vmax:        148.00 cm/s  PV Peak grad:   4.1 mmHg AV Peak Grad:   8.8 mmHg     RVOT Peak grad: 3 mmHg LVOT Vmax:      107.00 cm/s LVOT Vmean:     76.000 cm/s LVOT VTI:       0.249 m  AORTA Ao Root diam: 2.90 cm MITRAL VALVE MV Area (PHT): 4.86 cm     SHUNTS MV Decel Time: 156 msec     Systemic VTI:  0.25 m MV E velocity: 143.00 cm/s  Systemic Diam: 1.80 cm MV A velocity: 80.30 cm/s MV E/A ratio:  1.78 Dwayne D Callwood MD Electronically signed by Cara JONETTA Lovelace MD Signature Date/Time: 11/29/2023/10:33:04 AM    Final    US  Venous Img Lower Bilateral Result Date: 11/28/2023 CLINICAL DATA:  New onset left greater than right lower extremity swelling, without findings of CHF. EXAM: BILATERAL LOWER EXTREMITY VENOUS DOPPLER ULTRASOUND TECHNIQUE: Gray-scale sonography with graded compression, as well as color Doppler and duplex ultrasound were performed to evaluate the lower extremity deep venous systems from the level of the common femoral vein and including the common femoral, femoral, profunda femoral, popliteal and calf veins including the posterior tibial, peroneal and gastrocnemius veins when visible. The superficial great saphenous vein was also interrogated. Spectral Doppler was utilized to evaluate flow at rest and with distal augmentation maneuvers in the common  femoral, femoral and popliteal veins. COMPARISON:  Left lower extremity DVT exam 08/26/2019. FINDINGS: RIGHT LOWER EXTREMITY Common Femoral Vein: No evidence of thrombus. Normal compressibility, respiratory phasicity and response to augmentation. Saphenofemoral Junction: No evidence of thrombus. Normal compressibility and flow on color Doppler imaging. Profunda Femoral Vein: No evidence of thrombus. Normal compressibility and flow on color Doppler imaging. Femoral Vein: No evidence of thrombus. Normal compressibility, respiratory phasicity and response to augmentation. Popliteal Vein: No evidence of thrombus. Normal compressibility, respiratory phasicity and response to augmentation. Calf Veins: No evidence of thrombus. Normal compressibility and flow on color Doppler imaging. Superficial Great Saphenous Vein: No evidence of thrombus. Normal compressibility. Venous Reflux:  None. Other Findings:  Pulsatile venous waveforms. LEFT LOWER EXTREMITY Common Femoral Vein: No evidence of thrombus. Normal compressibility, respiratory phasicity and response to augmentation. Saphenofemoral Junction: No evidence of thrombus. Normal compressibility and flow on color Doppler imaging. Profunda Femoral Vein: No evidence of thrombus. Normal compressibility and flow on color Doppler imaging. Femoral Vein: No evidence of thrombus. Normal compressibility, respiratory phasicity and response to augmentation. Popliteal Vein: No evidence of thrombus. Normal compressibility, respiratory phasicity and response to augmentation. Calf Veins: No evidence of thrombus. Normal compressibility and flow on color Doppler imaging. Superficial Great Saphenous Vein: No evidence of thrombus. Normal compressibility. Venous Reflux:  None. Other Findings:  Pulsatile venous waveforms. IMPRESSION: 1. No evidence of deep venous thrombosis in either lower extremity. 2. Pulsatile venous waveforms, which can be seen with right heart failure or tricuspid  regurgitation. Electronically Signed   By: Francis Quam M.D.   On: 11/28/2023 04:56  DG Chest Portable 1 View Result Date: 11/28/2023 CLINICAL DATA:  Difficulty breathing EXAM: PORTABLE CHEST 1 VIEW COMPARISON:  07/19/2023 FINDINGS: Cardiac shadow is mildly enlarged. Aortic calcifications are seen. Mild vascular congestion is noted. Small right-sided pleural effusion is noted. Metallic densities are noted symmetrically over the upper chest likely artifactual in nature. IMPRESSION: Small right pleural effusion and vascular congestion. Electronically Signed   By: Oneil Devonshire M.D.   On: 11/28/2023 02:59    Assessment:   JOLANE BANKHEAD is a 78 y.o. female Admitted with hypertensive urgency and acute on chronic kidney failure requiring placement of a dialysis catheter.  Several days into admission she had a fever and was found to have MRSA bacteremia.  Permacath was removed June 24 and she is due for repeat dialysis June 24. Clinically improving and fu cx neg from 6/21.  Her history is complicated because she had an MRSA bacteremia back in February of unknown source.  She has chronic back issues and has had lumbar fusion.  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  Prior to this admission she had a recent MRI done at Dr. Mavis office that per report did not show any evidence of infection.  She is not having much back pain now. TEE negative.   Recommendations Would plan on min 4 week treatment at this point. May need longer if concern for back infeciton Unclear if bacteremia could have seeded her spine but would be too early to image again. Will dw renal and vascular placement of HD cath and plans for HD going forward to decide between vanco and dapto but likey can be given at HD  Thank you very much for allowing me to participate in the care of this patient. Please call with questions.   Alm SQUIBB. Epifanio, MD

## 2023-12-08 NOTE — Telephone Encounter (Signed)

## 2023-12-08 NOTE — Progress Notes (Signed)
Patient NPO for TEE.

## 2023-12-08 NOTE — Plan of Care (Signed)
   Problem: Safety: Goal: Ability to remain free from injury will improve Outcome: Progressing

## 2023-12-08 NOTE — Transfer of Care (Signed)
 Immediate Anesthesia Transfer of Care Note  Patient: Mckenzie Lawrence  Procedure(s) Performed: ECHO TEE ECHOCARDIOGRAM, TRANSESOPHAGEAL  Patient Location: [facility]  Anesthesia Type:General  Level of Consciousness: drowsy  Airway & Oxygen Therapy: Patient Spontanous Breathing and Patient connected to nasal cannula oxygen  Post-op Assessment: Report given to RN and Post -op Vital signs reviewed and stable  Post vital signs: Reviewed and stable  Last Vitals:  Vitals Value Taken Time  BP    Temp    Pulse 60 12/08/23 13:27  Resp 30 12/08/23 13:27  SpO2 98 % 12/08/23 13:27    Last Pain:  Vitals:   12/08/23 1307  TempSrc: Oral  PainSc: 0-No pain      Patients Stated Pain Goal: 0 (12/05/23 2305)  Complications: There were no known notable events for this encounter.

## 2023-12-08 NOTE — Progress Notes (Signed)
*  PRELIMINARY RESULTS* Echocardiogram Echocardiogram Transesophageal has been performed.  Mckenzie Lawrence 12/08/2023, 1:31 PM

## 2023-12-08 NOTE — Progress Notes (Addendum)
       CROSS COVER NOTE  NAME: Mckenzie Lawrence MRN: [id number] DOB : December 06, 1945    Concern as stated by nurse / staff   called by Telemtry at around 1:25 am regarding patient's HR converting into A-Fib at around 0052 and continuing at range of low 100s. Now ranging from 110 to 120. Vitals taken recently: Patient afebrile with temp of 98.8. BP 145/70 MAP 93 . on RA was 90%. I placed on 2 L of O2 and now at 97      Pertinent findings on chart review: Last progress note reviewed  Patient Assessment    12/08/2023    3:51 AM 12/08/2023   12:38 AM 12/07/2023    8:15 PM  Vitals with BMI  Systolic 145 156 875  Diastolic 70 56 43  Pulse 110 42 66       Assessment and  Interventions   Assessment:  New onset A fib with RVR in the setting of acute infection  Plan: EKG--> as above,  Diltiazem 15 mg IV x 1 Will defer to primary attending for need for [facility] for secondary stroke prevention X

## 2023-12-08 NOTE — Progress Notes (Signed)
 Consent for Transesophageal Echocardiogram signed by patient's daughter per patient request and in chart.

## 2023-12-08 NOTE — Progress Notes (Signed)
 \ PROGRESS NOTE   HPI was taken from Dr. Cleatus: Mckenzie Lawrence is a 78 y.o. female with medical history significant for COPD, CVA, stage 4 CKD, SIADH, esophageal dysphagia treated with Botox  (last in 05/2023), DM, HLD, and HTN, last hospitalized from 2/1 to 07/26/2023 with MRSA bacteremia/sepsis/multifocal pneumonia, now presenting by EMS with shortness of breath and chest pressure.  Daughter at bedside gives history and states that her mother was in her usual state of health until earlier in the day on 6/12 when she developed shortness of breath while getting an MRI on her back.  His symptoms seem to improve however on the morning of 6/13 she awoke at 2 AM gasping for breath and called EMS.  She states patient's blood pressure had been running high lately due to ongoing back pain and she had also complained of swelling in her legs.  She has no orthopnea and sleeps on 1 pillow.  She has a chronic congested cough that has not changed and has no fever or chills  BP on arrival 202/74 and tachypneic to 25 saturating at 98% on room air.  Afebrile and pulse in the 70s.  Labs notable for WBC 11,000 with hemoglobin of 9 which is her baseline. Troponin 19 and BNP 2450 Creatinine 3.59 up from baseline of 2.74 with bicarb 19.  Glucose 207, potassium 5.2, sodium 123 (137 four months ago) EKG with sinus rhythm at 79 Chest x-ray showing small right pleural effusion and vascular congestion Bilateral lower extremity venous ultrasound ordered-result pending. Patient treated with DuoNeb and Solu-Medrol  and given a dose of Lasix  Hospitalist consulted for admission for COPD exacerbation and possible new onset CHF.      Mckenzie Lawrence Like  FMW:[id number] DOB: June 23, 1945 DOA: 11/28/2023 PCP: [name], MD   Assessment & Plan:   Principal Problem:   Hypertensive urgency/emergency Active Problems:   COPD with acute exacerbation (HCC)   Acute dyspnea   CAD (coronary artery disease)   Chest pain   Acute renal  failure superimposed on stage 4 chronic kidney disease (HCC)   Hyponatremia   Hypertensive emergency   Encounter for dialysis catheter care [facility])   Paroxysmal atrial fibrillation (HCC)  Assessment and Plan:  MRSA bacteremia: fever on 6/19, blood cultures are positive. Hx mrsa bacteremia 2/24 2/2 infected spinal hardware, had recurrent bout of mrsa bacteremia earlier this year no source identified. Per daughter no overt signs infection on mri obtained earlier this month. Has dialysis catheter placed this hospitalization, removed by vascular on 6/21. PIV also exchanged Repeat blood cultures ordered 6/22, ngtd. TTE/TEE negative. ID following. Likely plan for tunneled dialysis catheter in a day or 2. Per daughter ID advised holding off on imaging of spine.  Paroxysmal a-fib: new overnight 6/22, now back in sinus. Diltiazem and apixaban started. Will check tsh  Hypertensive urgency/emergency: resolved, BPs appropriate today Continue on metoprolol , losartan . New dilt for amlodipine  as below. Have increased clonidine  dose . IV hydralazine  prn   Acute diastolic CHF: w/ elevated BNP, CXR showing vascular congestion, pitting LE edema & dyspnea. Monitor I/Os. Continue on metoprolol , losartan . Fluid/volume management w/ HD. Echo shows EF 50-55%, grade II diastolic dysfunction, no regional wall motion abnormalities. Cardio following and recs apprec    COPD exacerbation: tessalon , bronchodilators & encourage incentive spirometry. Off steroids 6/18, appears resolved  Chest pain: w/ hx of CAD. Troponins are minimally elevated. EKG shows no ischemic changes. Continue on losartan , metoprolol , statin, aspirin . No CP currently   ESRD progressing from [facility]DIV:  C /p tunneled HD cath placed. Started on HD 12/02/23. Will need outpatient HD spot prior to d/c.  Nephro following and recs apprec. Dialysis on tts schedule. Has outpatient chair.   Anxiety: severity unknown. Valium  discontinued 6/19  Sedation:  resolved  Hyperkalemia: will managed w/ HD    Low back pain: see above   Hyponatremia: w/ hx of SIADH. Labile. Will be managed w/ HD. Nephro following and recs apprec   ACD: likely secondary to CKD. H&H are stable  Acute diarrhea: x 3 days. Resolved as per pt    Esophageal dysphagia: s/p botox  via EGD in 08/2022. Aspiration precautions. Continue w/ supportive care  Decreased PO: daughter concerned about intake, TD has formally consulted and ordered supplements   OSA: CPAP qhs  DM2: well controlled at baseline, HbA1c 6.1. sugars labile here, elevated this morning, will increase semglee  to 10  Insomnia: decreased zolpidem  as above  Debility: PT rec is now snf, toc consulted.    Constipation: reports stools once weekly at baseline,  declines lactulose . Is on miralax .     DVT prophylaxis: heparin  Code Status: dnr  Family Communication: daughter at bedside 6/23 Disposition Plan:  snf  Level of care: Progressive  Status is: Inpatient Remains inpatient appropriate because: severity of illness   Consultants:  Nephro, cardiology, ID   Antimicrobials: vancomycin   Subjective: Tolerated procedure, feeling tired  Objective: Vitals:   12/08/23 1400 12/08/23 1401 12/08/23 1415 12/08/23 1430  BP: (!) 135/51 (!) 135/51 (!) 132/50   Pulse: 64 64 65   Resp: (!) 27 (!) 26 20   Temp:    98.5 F (36.9 C)  TempSrc:    Oral  SpO2: 99% 98% 98%   Weight:      Height:        Intake/Output Summary (Last 24 hours) at 12/08/2023 1624 Last data filed at 12/08/2023 1327 Gross per 24 hour  Intake 250 ml  Output 400 ml  Net -150 ml    Filed Weights   12/06/23 0500 12/07/23 0500 12/08/23 0500  Weight: 71.9 kg 77.6 kg 78.8 kg    Examination:  General exam: appears chronically ill, NAD Respiratory system: diminished breath sounds b/l, scattered exp wheeze Cardiovascular system: S1/S2+. No rubs or gallops  Gastrointestinal system: abd is soft, NT  Central nervous system:  alert & oriented. Moving all 4 Psychiatry: judgement and insight appears at baseline. Flat mood and affect    Data Reviewed: I have personally reviewed following labs and imaging studies  CBC: Recent Labs  Lab 12/02/23 0430 12/03/23 0352 12/04/23 0508 12/06/23 0750 12/07/23 1219  WBC 10.3 12.1* 12.1* 15.6* 13.2*  HGB 8.2* 7.8* 8.6* 8.2* 7.1*  HCT 24.2* 23.6* 27.7* 25.2* 21.6*  MCV 91.0 90.1 93.9 94.0 93.9  PLT 229 222 228 193 150   Basic Metabolic Panel: Recent Labs  Lab 12/02/23 0430 12/03/23 0352 12/04/23 0508 12/06/23 0750 12/07/23 1219  NA 127* 130* 131* 130* 132*  K 5.7* 4.7 4.2 3.9 3.5  CL 96* 98 97* 93* 94*  CO2 20* 24 26 26 27   GLUCOSE 331* 224* 116* 182* 238*  BUN 87* 61* 38* 32* 28*  CREATININE 4.22* 3.19* 2.59* 3.03* 2.65*  CALCIUM  8.3* 8.1* 8.0* 7.9* 7.7*  PHOS  --   --   --  4.0 2.8   GFR: Estimated Creatinine Clearance: 19.5 mL/min (A) (by C-G formula based on SCr of 2.65 mg/dL (H)). Liver Function Tests: Recent Labs  Lab 12/06/23 0750 12/07/23 1219  ALBUMIN 2.0*  1.8*   No results for input(s): LIPASE, AMYLASE in the last 168 hours. No results for input(s): AMMONIA in the last 168 hours. Coagulation Profile: No results for input(s): INR, PROTIME in the last 168 hours. Cardiac Enzymes: No results for input(s): CKTOTAL, CKMB, CKMBINDEX, TROPONINI in the last 168 hours. BNP (last 3 results) No results for input(s): PROBNP in the last 8760 hours. HbA1C: No results for input(s): HGBA1C in the last 72 hours. CBG: Recent Labs  Lab 12/07/23 1203 12/07/23 1641 12/07/23 2223 12/08/23 0745 12/08/23 1118  GLUCAP 243* 317* 185* 186* 204*   Lipid Profile: No results for input(s): CHOL, HDL, LDLCALC, TRIG, CHOLHDL, LDLDIRECT in the last 72 hours. Thyroid Function Tests: No results for input(s): TSH, T4TOTAL, FREET4, T3FREE, THYROIDAB in the last 72 hours. Anemia Panel: No results for input(s):  VITAMINB12, FOLATE, FERRITIN, TIBC, IRON, RETICCTPCT in the last 72 hours. Sepsis Labs: No results for input(s): PROCALCITON, LATICACIDVEN in the last 168 hours.  Recent Results (from the past 240 hours)  Culture, blood (Routine X 2) w Reflex to ID Panel     Status: Abnormal   Collection Time: 12/04/23  6:34 PM   Specimen: BLOOD  Result Value Ref Range Status   Specimen Description   Final    BLOOD BLOOD RIGHT ARM Performed at [facility], [address] [place], [place] [address]    Special Requests   Final    BOTTLES DRAWN AEROBIC AND ANAEROBIC Blood Culture adequate volume Performed at [facility], [address] [place], [place] [address]    Culture  Setup Time   Final    Organism ID to follow IN BOTH AEROBIC AND ANAEROBIC BOTTLES GRAM POSITIVE COCCI CRITICAL RESULT CALLED TO, READ BACK BY AND VERIFIED WITH: JASON ROBBINS 12/06/2023 AT 9351 SRR Performed at [facility], [address] [place], [place] [address]    Culture METHICILLIN RESISTANT STAPHYLOCOCCUS AUREUS (A)  Final   Report Status 12/08/2023 FINAL  Final   Organism ID, Bacteria METHICILLIN RESISTANT STAPHYLOCOCCUS AUREUS  Final      Susceptibility   Methicillin resistant staphylococcus aureus - MIC*    CIPROFLOXACIN  >=8 RESISTANT Resistant     ERYTHROMYCIN  >=8 RESISTANT Resistant     GENTAMICIN  <=0.5 SENSITIVE Sensitive     OXACILLIN >=4 RESISTANT Resistant     TETRACYCLINE <=1 SENSITIVE Sensitive     VANCOMYCIN  2 SENSITIVE Sensitive     TRIMETH/SULFA <=10 SENSITIVE Sensitive     CLINDAMYCIN <=0.25 SENSITIVE Sensitive     RIFAMPIN <=0.5 SENSITIVE Sensitive     Inducible Clindamycin NEGATIVE Sensitive     LINEZOLID  2 SENSITIVE Sensitive     * METHICILLIN RESISTANT STAPHYLOCOCCUS AUREUS  Culture, blood (Routine X 2) w Reflex to ID Panel     Status: Abnormal   Collection Time: 12/04/23  6:34 PM   Specimen: BLOOD  Result Value Ref Range Status   Specimen  Description   Final    BLOOD BLOOD LEFT ARM Performed at [facility], [address], [place], [place] [address]    Special Requests   Final    BOTTLES DRAWN AEROBIC AND ANAEROBIC Blood Culture adequate volume Performed at [facility], [address] [place], [place] [address]    Culture  Setup Time   Final    GRAM POSITIVE COCCI IN BOTH AEROBIC AND ANAEROBIC BOTTLES CRITICAL VALUE NOTED.  VALUE IS CONSISTENT WITH PREVIOUSLY REPORTED AND CALLED VALUE. Performed at [facility], [address] [place],  [place] [address]    Culture (A)  Final    STAPHYLOCOCCUS AUREUS SUSCEPTIBILITIES PERFORMED ON PREVIOUS CULTURE WITHIN THE LAST 5 DAYS. Performed at [facility], [address] [place], [place] [address]    Report Status 12/08/2023 FINAL  Final  Blood Culture ID Panel (Reflexed)     Status: Abnormal   Collection Time: 12/04/23  6:34 PM  Result Value Ref Range Status   Enterococcus faecalis NOT DETECTED NOT DETECTED Final   Enterococcus Faecium NOT DETECTED NOT DETECTED Final   Listeria monocytogenes NOT DETECTED NOT DETECTED Final   Staphylococcus species DETECTED (A) NOT DETECTED Final    Comment: CRITICAL RESULT CALLED TO, READ BACK BY AND VERIFIED WITH: JASON ROBBINS 12/06/2023 AT 0648 SRR    Staphylococcus aureus (BCID) DETECTED (A) NOT DETECTED Final    Comment: Methicillin (oxacillin)-resistant Staphylococcus aureus (MRSA). MRSA is predictably resistant to beta-lactam antibiotics (except ceftaroline). Preferred therapy is vancomycin  unless clinically contraindicated. Patient requires contact precautions if  hospitalized. CRITICAL RESULT CALLED TO, READ BACK BY AND VERIFIED WITH: JASON ROBBINS 12/06/2023 AT 0648 SRR    Staphylococcus epidermidis NOT DETECTED NOT DETECTED Final   Staphylococcus lugdunensis NOT DETECTED NOT DETECTED Final   Streptococcus species NOT DETECTED NOT DETECTED Final   Streptococcus agalactiae NOT DETECTED NOT  DETECTED Final   Streptococcus pneumoniae NOT DETECTED NOT DETECTED Final   Streptococcus pyogenes NOT DETECTED NOT DETECTED Final   A.calcoaceticus-baumannii NOT DETECTED NOT DETECTED Final   Bacteroides fragilis NOT DETECTED NOT DETECTED Final   Enterobacterales NOT DETECTED NOT DETECTED Final   Enterobacter cloacae complex NOT DETECTED NOT DETECTED Final   Escherichia coli NOT DETECTED NOT DETECTED Final   Klebsiella aerogenes NOT DETECTED NOT DETECTED Final   Klebsiella oxytoca NOT DETECTED NOT DETECTED Final   Klebsiella pneumoniae NOT DETECTED NOT DETECTED Final   Proteus species NOT DETECTED NOT DETECTED Final   Salmonella species NOT DETECTED NOT DETECTED Final   Serratia marcescens NOT DETECTED NOT DETECTED Final   Haemophilus influenzae NOT DETECTED NOT DETECTED Final   Neisseria meningitidis NOT DETECTED NOT DETECTED Final   Pseudomonas aeruginosa NOT DETECTED NOT DETECTED Final   Stenotrophomonas maltophilia NOT DETECTED NOT DETECTED Final   Candida albicans NOT DETECTED NOT DETECTED Final   Candida auris NOT DETECTED NOT DETECTED Final   Candida glabrata NOT DETECTED NOT DETECTED Final   Candida krusei NOT DETECTED NOT DETECTED Final   Candida parapsilosis NOT DETECTED NOT DETECTED Final   Candida tropicalis NOT DETECTED NOT DETECTED Final   Cryptococcus neoformans/gattii NOT DETECTED NOT DETECTED Final   Meth resistant mecA/C and MREJ DETECTED (A) NOT DETECTED Final    Comment: CRITICAL RESULT CALLED TO, READ BACK BY AND VERIFIED WITH: JASON ROBBINS 12/06/2023 AT 9351 SRR Performed at [facility], [address] [place], [place] [address]   SARS Coronavirus 2 by RT PCR (hospital order, performed in [facility] hospital lab) *cepheid single result test* Anterior Nasal Swab     Status: None   Collection Time: 12/04/23  8:40 PM   Specimen: Anterior Nasal Swab  Result Value Ref Range Status   SARS Coronavirus 2 by RT PCR NEGATIVE NEGATIVE Final    Comment:  (NOTE) SARS-CoV-2 target nucleic acids are NOT DETECTED.  The SARS-CoV-2 RNA is generally detectable in upper and lower respiratory specimens during the acute phase of infection. The lowest concentration of SARS-CoV-2 viral copies this assay can detect is 250 copies / mL. A negative result does not preclude SARS-CoV-2 infection  and should not be used as the sole basis for treatment or other patient management decisions.  A negative result may occur with improper specimen collection / handling, submission of specimen other than nasopharyngeal swab, presence of viral mutation(s) within the areas targeted by this assay, and inadequate number of viral copies (<250 copies / mL). A negative result must be combined with clinical observations, patient history, and epidemiological information.  Fact Sheet for Patients:   RoadLapTop.co.za  Fact Sheet for Healthcare Providers: http://kim-miller.com/  This test is not yet approved or  cleared by the [place]  FDA and has been authorized for detection and/or diagnosis of SARS-CoV-2 by FDA under an Emergency Use Authorization (EUA).  This EUA will remain in effect (meaning this test can be used) for the duration of the COVID-19 declaration under Section 564(b)(1) of the Act, 21 U.S.C. section 360bbb-3(b)(1), unless the authorization is terminated or revoked sooner.  Performed at [facility], [address] [place], [place] [address]   Urine Culture (for pregnant, neutropenic or urologic patients or patients with an indwelling urinary catheter)     Status: Abnormal   Collection Time: 12/04/23  8:45 PM   Specimen: Urine, Clean Catch  Result Value Ref Range Status   Specimen Description   Final    URINE, CLEAN CATCH Performed at [facility], [address] [place], [place] [address]    Special Requests   Final    NONE Performed at [facility], [address], [place], [place] [address]    Culture (A)  Final    <10,000 COLONIES/mL INSIGNIFICANT GROWTH Performed at [facility], [address], [place], [place] [address]    Report Status 12/06/2023 FINAL  Final  Respiratory (~20 pathogens) panel by PCR     Status: None   Collection Time: 12/04/23 11:30 PM   Specimen: Nasopharyngeal Swab; Respiratory  Result Value Ref Range Status   Adenovirus NOT DETECTED NOT DETECTED Final   Coronavirus 229E NOT DETECTED NOT DETECTED Final    Comment: (NOTE) The Coronavirus on the Respiratory Panel, DOES NOT test for the novel  Coronavirus (2019 nCoV)    Coronavirus HKU1 NOT DETECTED NOT DETECTED Final   Coronavirus NL63 NOT DETECTED NOT DETECTED Final   Coronavirus OC43 NOT DETECTED NOT DETECTED Final   Metapneumovirus NOT DETECTED NOT DETECTED Final   Rhinovirus / Enterovirus NOT DETECTED NOT DETECTED Final   Influenza A NOT DETECTED NOT DETECTED Final   Influenza B NOT DETECTED NOT DETECTED Final   Parainfluenza Virus 1 NOT DETECTED NOT DETECTED Final   Parainfluenza Virus 2 NOT DETECTED NOT DETECTED Final   Parainfluenza Virus 3 NOT DETECTED NOT DETECTED Final   Parainfluenza Virus 4 NOT DETECTED NOT DETECTED Final   Respiratory Syncytial Virus NOT DETECTED NOT DETECTED Final   Bordetella pertussis NOT DETECTED NOT DETECTED Final   Bordetella Parapertussis NOT DETECTED NOT DETECTED Final   Chlamydophila pneumoniae NOT DETECTED NOT DETECTED Final   Mycoplasma pneumoniae NOT DETECTED NOT DETECTED Final    Comment: Performed at [facility], [address] [place], [place] [address]  Cath Tip Culture     Status: None (Preliminary result)   Collection Time: 12/06/23  2:45 PM   Specimen: Catheter Tip; Other  Result Value Ref Range Status   Specimen Description   Final    CATH TIP Performed at [facility], [address], [place], [place] [address]    Special Requests   Final  NONE Performed at [facility], [address] [place], [place] [address]    Culture   Final    NO GROWTH < 12 HOURS Performed at [facility], [address], [place], [place] [address]    Report Status PENDING  Incomplete  MRSA Next Gen by PCR, Nasal     Status: Abnormal   Collection Time: 12/07/23  9:00 AM   Specimen: Nasal Mucosa; Nasal Swab  Result Value Ref Range Status   MRSA by PCR Next Gen DETECTED (A) NOT DETECTED Final    Comment: RESULT CALLED TO, READ BACK BY AND VERIFIED WITH: ELIZABETH HAYS 12/07/23 1041 SLM (NOTE) The GeneXpert MRSA Assay (FDA approved for NASAL specimens only), is one component of a comprehensive MRSA colonization surveillance program. It is not intended to diagnose MRSA infection nor to guide or monitor treatment for MRSA infections. Test performance is not FDA approved in patients less than 49 years old. Performed at [facility], [address] [place], [place] [address]   Culture, blood (Routine X 2) w Reflex to ID Panel     Status: None (Preliminary result)   Collection Time: 12/07/23 12:50 PM   Specimen: BLOOD  Result Value Ref Range Status   Specimen Description BLOOD RIGHT ANTECUBITAL  Final   Special Requests   Final    BOTTLES DRAWN AEROBIC AND ANAEROBIC Blood Culture adequate volume   Culture   Final    NO GROWTH < 24 HOURS Performed at [facility], [address], [place], [place] [address]    Report Status PENDING  Incomplete  Culture, blood (Routine X 2) w Reflex to ID Panel     Status: None (Preliminary result)   Collection Time: 12/07/23 12:50 PM   Specimen: BLOOD  Result Value Ref Range Status   Specimen Description BLOOD LEFT ANTECUBITAL  Final   Special Requests   Final    BOTTLES DRAWN AEROBIC AND ANAEROBIC Blood Culture adequate volume   Culture   Final    NO GROWTH < 24 HOURS Performed at [facility], [address] [place], [place] [address]    Report Status PENDING  Incomplete         Radiology  Studies: ECHOCARDIOGRAM LIMITED Result Date: 12/07/2023    ECHOCARDIOGRAM LIMITED REPORT   Patient Name:   Mckenzie Lawrence Date of Exam: 12/07/2023 Medical Rec #:  [id number]      Height:       67.0 in Accession #:    [id number]     Weight:       171.1 lb Date of Birth:  [birth year]      BSA:          1.892 m Patient Age:    78 years       BP:           147/56 mmHg Patient Gender: F              HR:           62 bpm. Exam Location:  [facility] Procedure: Limited Echo (Both Spectral and Color Flow Doppler were utilized            during procedure). Indications:     Bacteremia R78.81  History:         Patient has prior history of Echocardiogram examinations, most                  recent 11/29/2023.  Sonographer:  Thedora Louder RDCS, FASE Referring Phys:  [id number] DWAYNE D CALLWOOD Diagnosing Phys: Cara JONETTA Lovelace MD  Sonographer Comments: Limited 2D echo requested no spectral or color flow doppler performed on this exam. IMPRESSIONS  1. Aotic valve valve slightly thickened.  2. Mitral valve wth mild thickening.  3. No obvious vegataion seen.  4. Recommed TEE.  5. Left ventricular ejection fraction, by estimation, is 60 to 65%. The left ventricle has normal function. The left ventricle has no regional wall motion abnormalities. Left ventricular diastolic function could not be evaluated.  6. Right ventricular systolic function is normal. The right ventricular size is normal.  7. The mitral valve is normal in structure.  8. The aortic valve is grossly normal. Aortic valve sclerosis is present, with no evidence of aortic valve stenosis. Conclusion(s)/Recommendation(s): Poor windows for evaluation of left ventricular function by transthoracic echocardiography. Would recommend an alternative means of evaluation. No evidence of valvular vegetations on this transthoracic echocardiogram. Consider a transesophageal echocardiogram to exclude infective endocarditis if clinically indicated. FINDINGS  Left Ventricle: Left ventricular  ejection fraction, by estimation, is 60 to 65%. The left ventricle has normal function. The left ventricle has no regional wall motion abnormalities. The left ventricular internal cavity size was normal in size. There is  no left ventricular hypertrophy. Left ventricular diastolic function could not be evaluated. Right Ventricle: The right ventricular size is normal. No increase in right ventricular wall thickness. Right ventricular systolic function is normal. Left Atrium: Left atrial size was normal in size. Right Atrium: Right atrial size was normal in size. Pericardium: There is no evidence of pericardial effusion. Mitral Valve: The mitral valve is normal in structure. There is mild thickening of the mitral valve leaflet(s). There is mild calcification of the mitral valve leaflet(s). Normal mobility of the mitral valve leaflets. Tricuspid Valve: The tricuspid valve is normal in structure. Aortic Valve: The aortic valve is grossly normal. Aortic valve sclerosis is present, with no evidence of aortic valve stenosis. Pulmonic Valve: The pulmonic valve was normal in structure. Aorta: The ascending aorta was not well visualized. Additional Comments: No obvious vegataion seen. Aotic valve valve slightly thickened. Mitral valve wth mild thickening. Recommed TEE.  Dwayne D Callwood MD Electronically signed by Cara JONETTA Lovelace MD Signature Date/Time: 12/07/2023/1:28:01 PM    Final           Scheduled Meds:  apixaban  5 mg Oral BID   aspirin  EC  81 mg Oral QHS   Chlorhexidine  Gluconate Cloth  6 each Topical Q0600   cloNIDine   0.2 mg Oral BID   diltiazem  60 mg Oral Q6H   epoetin  alfa-epbx (RETACRIT ) injection  4,000 Units Intravenous Q T,Th,Sat-1800   insulin  aspart  0-5 Units Subcutaneous QHS   insulin  aspart  0-9 Units Subcutaneous TID WC   insulin  aspart  3 Units Subcutaneous TID WC   insulin  glargine-yfgn  10 Units Subcutaneous QHS   lactose free nutrition  237 mL Oral TID WC   lactulose   30 g  Oral Once   lidocaine   10 mL Intradermal Once   losartan   100 mg Oral QHS   metoprolol  succinate  100 mg Oral QHS   multivitamin  1 tablet Oral QHS   polyethylene glycol  17 g Oral Daily   vancomycin  variable dose per unstable renal function (pharmacist dosing)   Does not apply See admin instructions   Continuous Infusions:  anticoagulant sodium citrate     vancomycin  1,500 mg (12/08/23 1610)  LOS: 10 days       Devaughn KATHEE Ban, MD [facility]  If 7PM-7AM, please contact night-coverage [email or web address] 12/08/2023, 4:24 PM

## 2023-12-08 NOTE — Progress Notes (Signed)
 Pharmacy Antibiotic Note  Mckenzie Lawrence is a 78 y.o. female admitted on 11/28/2023 with hypertensive urgency/emergency, possible new onset CHF, and acute renal failure on CKD. Patient has PMH of DM, lumbar spinal fusion, HLD, and CKD. Patient had PermCath placed on 6/16 and was started on HD. Blood cultures from 6/19 showing MRSA Bactermia. Permath removed 6/21.  Pharmacy has been consulted for Vancomycin  dosing.  Today, 12/08/2023 Day #3 vancomycin  Renal: AKI on CKD with new start of HD this admission with development of likely line infection with MRSA WBC 13.2 (on 6/22) Afebrile 6/21 Cath tip culture: NGTD 6/22 repeat Bcx: NGTD  Vancomycin  levels: Vancomycin  1500mg  IV x 1 given 6/21 at 0929 6/22 Random vancomycin  level at 0657 = 13 mcg/ml 6/23 Random vancomycin  level at 0346 = 9 mcg/ml  Plan: Based on random vancomycin  levels, redose vancomycin  at 1500mg  IV x 1 today.  For estimated peak of 30 mcg/mL Repeat random vancomycin  level in morning - although do not anticipate will need vancomycin  dose tomorrow based on renal function  Plan to redose when vancomycin  level < 20 mcg/ml pending repeat blood culture results and ability to re-place HD catheter and resume HD Follow up tip culture and blood culture results Await TEE results ID following  Height: 5' 7 (170.2 cm) Weight: 78.8 kg (173 lb 11.6 oz) IBW/kg (Calculated) : 61.6  Temp (24hrs), Avg:98.6 F (37 C), Min:98.1 F (36.7 C), Max:99.3 F (37.4 C)  Recent Labs  Lab 12/02/23 0430 12/03/23 0352 12/04/23 0508 12/06/23 0750 12/07/23 0657 12/07/23 1219 12/08/23 0346  WBC 10.3 12.1* 12.1* 15.6*  --  13.2*  --   CREATININE 4.22* 3.19* 2.59* 3.03*  --  2.65*  --   VANCOTROUGH  --   --   --   --  12*  --   --   VANCORANDOM  --   --   --   --   --   --  9    Estimated Creatinine Clearance: 19.5 mL/min (A) (by C-G formula based on SCr of 2.65 mg/dL (H)).    Allergies  Allergen Reactions   Iodine Anaphylaxis and Other (See  Comments)    Pt states that she is allergic to ingested iodine only, okay for betadine.     Iodine I-131 Tositumomab Anaphylaxis   Shellfish Allergy Anaphylaxis   Codeine Nausea And Vomiting   Morphine  Sulfate Nausea And Vomiting   Irbesartan Other (See Comments)     Unknown  (Avapro)   Sulfa Antibiotics Other (See Comments)    Fever     Antimicrobials this admission:  6/21 Vancomycin   >>   Dose adjustments this admission:   Microbiology results: 6/19  BCx: MRSA   Thank you for allowing pharmacy to be a part of this patient's care.  Remona Boom, PharmD, BCPS, BCIDP Work Cell: [phone] 12/08/2023 12:21 PM

## 2023-12-08 NOTE — Progress Notes (Signed)
 PT Cancellation Note  Patient Details Name: Mckenzie Lawrence MRN: [id number] DOB: 1946/01/04   Cancelled Treatment:     Pt off floor for ECHO TEE with general anesthesia. Will re-attempt next available date/time per POC.    Darice JAYSON Bohr 12/08/2023, 2:04 PM

## 2023-12-08 NOTE — Progress Notes (Signed)
 Nutrition Follow-up  DOCUMENTATION CODES:   Not applicable  INTERVENTION:   -Once diet is resumed:  -Liberalize diet to regular for widest variety of meal selections -Renal MVI daily -Boost Plus TID, each supplement provides 360 kcals and 14 grams protein  NUTRITION DIAGNOSIS:   Increased nutrient needs related to chronic illness (ESRD on HD) as evidenced by estimated needs.  Ongoing  GOAL:   Patient will meet greater than or equal to 90% of their needs  Unmet  MONITOR:   PO intake, Supplement acceptance  REASON FOR ASSESSMENT:   Consult Diet education  ASSESSMENT:   Pt with medical history significant for COPD, CVA, stage 4 CKD, SIADH, esophageal dysphagia treated with Botox  (last in 05/2023), DM, HLD, and HTN, last hospitalized from 2/1 to 07/26/2023 with MRSA bacteremia/sepsis/multifocal pneumonia, now presenting with shortness of breath and chest pressure.  6/16- s/p rt internal jugular HD cath placement, first HD treatment 6/18- second HD session 6/19- third HD treatment 6/21- rt permacath removed secondary to bacteremia  Reviewed I/O's: -150 ml x 24 hours and -2.5 L since admission  Pt out of room for procedure at time of visit. Pt is scheduled for TEE today.   Per MD notes, pt daughter concerned due to lack of oral intake. RD met with pt on 12/05/23. Pt does not like the hospital food; RD liberalized diet to regular for wider variety of meal selections and added Boost per her request at that visit. Documented meal completions 0%. Family has also been bringing in outside food.   Pt without BM since 11/28/23. Per RN, POA states normal bowel regimen is one BM every 7-10 days. Pt is refusing bowel regimen that has been ordered. She also declined prune juice.  Per TOC notes, plan for home with home health services. Pt has outpatient placement for HD.   Wt has been stable over the past week.   Medications reviewed and include lactulose , miralax , and cardizem.    Labs reviewed: Na: 132, CBGS: 186-317 (inpatient orders for glycemic control are 0-5 units insulin  aspart daily at bedtime, 0-9 units insulin  aspart TID with meal,s 3 units insulin  aspart daily with meals, and 10 units insulin  glargine-yfgn daily at bedtime).    Diet Order:   Diet Order             Diet NPO time specified Except for: Sips with Meds  Diet effective now                   EDUCATION NEEDS:   Education needs have been addressed  Skin:  Skin Assessment: Reviewed RN Assessment  Last BM:  11/28/23  Height:   Ht Readings from Last 1 Encounters:  12/01/23 5' 7 (1.702 m)    Weight:   Wt Readings from Last 1 Encounters:  12/08/23 78.8 kg    Ideal Body Weight:  61.4 kg  BMI:  Body mass index is 27.21 kg/m.  Estimated Nutritional Needs:   Kcal:  1800-2000  Protein:  90-105 grams  Fluid:  1000 ml + UOP    Margery ORN, RD, LDN, CDCES Registered Dietitian III Certified Diabetes Care and Education Specialist If unable to reach this RD, please use RD Inpatient group chat on secure chat between hours of 8am-4 pm daily

## 2023-12-08 NOTE — Progress Notes (Signed)
*  PRELIMINARY RESULTS* Echocardiogram Echocardiogram Transesophageal has been performed.  Floydene Harder 12/08/2023, 1:32 PM

## 2023-12-08 NOTE — Progress Notes (Signed)
 Hand off will be done at bedside by Rutha Oman

## 2023-12-09 ENCOUNTER — Encounter: Payer: Self-pay | Admitting: Cardiology

## 2023-12-09 DIAGNOSIS — I160 Hypertensive urgency: Secondary | ICD-10-CM | POA: Diagnosis not present

## 2023-12-09 DIAGNOSIS — Z4901 Encounter for fitting and adjustment of extracorporeal dialysis catheter: Secondary | ICD-10-CM

## 2023-12-09 LAB — GLUCOSE, CAPILLARY
Glucose-Capillary: 216 mg/dL — ABNORMAL HIGH (ref 70–99)
Glucose-Capillary: 220 mg/dL — ABNORMAL HIGH (ref 70–99)
Glucose-Capillary: 287 mg/dL — ABNORMAL HIGH (ref 70–99)
Glucose-Capillary: 184 mg/dL — ABNORMAL HIGH (ref 70–99)

## 2023-12-09 LAB — BRAIN NATRIURETIC PEPTIDE: B Natriuretic Peptide: 1699.6 pg/mL — ABNORMAL HIGH (ref 0.0–100.0)

## 2023-12-09 LAB — CBC
HCT: 22.8 % — ABNORMAL LOW (ref 36.0–46.0)
Hemoglobin: 7.3 g/dL — ABNORMAL LOW (ref 12.0–15.0)
MCH: 29.8 pg (ref 26.0–34.0)
MCHC: 32 g/dL (ref 30.0–36.0)
MCV: 93.1 fL (ref 80.0–100.0)
Platelets: 209 10*3/uL (ref 150–400)
RBC: 2.45 MIL/uL — ABNORMAL LOW (ref 3.87–5.11)
RDW: 14.2 % (ref 11.5–15.5)
WBC: 13.8 10*3/uL — ABNORMAL HIGH (ref 4.0–10.5)
nRBC: 0 % (ref 0.0–0.2)

## 2023-12-09 LAB — BASIC METABOLIC PANEL WITH GFR
Anion gap: 11 (ref 5–15)
BUN: 41 mg/dL — ABNORMAL HIGH (ref 8–23)
CO2: 26 mmol/L (ref 22–32)
Calcium: 8.1 mg/dL — ABNORMAL LOW (ref 8.9–10.3)
Chloride: 95 mmol/L — ABNORMAL LOW (ref 98–111)
Creatinine, Ser: 3.78 mg/dL — ABNORMAL HIGH (ref 0.44–1.00)
GFR, Estimated: 12 mL/min — ABNORMAL LOW (ref >60)
Glucose, Bld: 218 mg/dL — ABNORMAL HIGH (ref 70–99)
Potassium: 3.8 mmol/L (ref 3.5–5.1)
Sodium: 132 mmol/L — ABNORMAL LOW (ref 135–145)

## 2023-12-09 LAB — VANCOMYCIN, RANDOM: Vancomycin Rm: 27 ug/mL

## 2023-12-09 LAB — TSH: TSH: 2.099 u[IU]/mL (ref 0.350–4.500)

## 2023-12-09 LAB — C-REACTIVE PROTEIN: CRP: 15.4 mg/dL — ABNORMAL HIGH (ref <1.0)

## 2023-12-09 MED ORDER — SODIUM CHLORIDE 0.9 % IV SOLN: INTRAVENOUS | Status: DC

## 2023-12-09 MED ORDER — DILTIAZEM HCL ER COATED BEADS 120 MG PO CP24
120.0000 mg | ORAL_CAPSULE | Freq: Every day | ORAL | Status: DC
  Administered 2023-12-09 – 2023-12-15 (×6): 120 mg via ORAL
  Filled 2023-12-09 (×6): qty 1

## 2023-12-09 MED ORDER — APIXABAN 5 MG PO TABS
5.0000 mg | ORAL_TABLET | Freq: Two times a day (BID) | ORAL | Status: DC
  Administered 2023-12-10: 5 mg via ORAL
  Filled 2023-12-09: qty 1

## 2023-12-09 MED ORDER — DILTIAZEM HCL ER COATED BEADS 120 MG PO CP24: 120.0000 mg | ORAL_CAPSULE | Freq: Every day | ORAL | Status: DC

## 2023-12-09 NOTE — Progress Notes (Signed)
 Physical Therapy Treatment Patient Details Name: Mckenzie Lawrence MRN: [id number] DOB: February 29, 1946 Today's Date: 12/09/2023   History of Present Illness 78 y/o female presented to ED on 11/28/23 for worsening SOB and chest pressure. Admitted for ESRD and acute CHF exacerbation. PMH: CAD, HTN, COPD, OSA, hx of CVA, CKD stage 4, SIADH, T2DM    PT Comments  Co-treat with OT this date. Today's tx involved continuation of mobility and ambulation to increase activity tolerance. [name] vitals stable, pt remaining above 90% on RA and HR responded appropriately to interventions. Pt continues to require verbal cueing for STS transfer with use of RW, no LOB experienced. Ambulation distance limited this date d/t fatigue, pt requiring x1 seated rest break after ambulating 10', being able to complete a total of 20' with CGA and chair follow. Pt continues to be overall limited by extreme fatigue but will continue to benefit from skilled PT interventions to meet therapy goals. PT to follow acutely as appropriate.     If plan is discharge home, recommend the following: A little help with walking and/or transfers;A little help with bathing/dressing/bathroom;Assistance with cooking/housework;Assist for transportation;Help with stairs or ramp for entrance   Can travel by private vehicle     Yes  Equipment Recommendations  None recommended by PT    Recommendations for Other Services       Precautions / Restrictions Precautions Precautions: Fall Recall of Precautions/Restrictions: Intact Restrictions Weight Bearing Restrictions Per Provider Order: No     Mobility  Bed Mobility               General bed mobility comments: pt found sitting in chair upon start of session    Transfers Overall transfer level: Needs assistance Equipment used: Rolling walker (2 wheels) Transfers: Sit to/from Stand Sit to Stand: Contact guard assist           General transfer comment: CGA to stand from chair and BSC,  increased time to complete transition, no LOB experienced    Ambulation/Gait Ambulation/Gait assistance: Contact guard assist (with chair follow) Gait Distance (Feet): 30 Feet Assistive device: Rolling walker (2 wheels) Gait Pattern/deviations: Step-through pattern, Decreased stride length, Trunk flexed       General Gait Details: CGA for safety. Patient continues to endorse extreme fatigue and declined further mobility, x1 seated rest break, chair follow, vitals remained above 90% on RA, HR 80   Stairs             Wheelchair Mobility     Tilt Bed    Modified Rankin (Stroke Patients Only)       Balance Overall balance assessment: Needs assistance Sitting-balance support: No upper extremity supported, Feet supported Sitting balance-Leahy Scale: Fair Sitting balance - Comments: sitting in chair   Standing balance support: Bilateral upper extremity supported, During functional activity, Reliant on assistive device for balance Standing balance-Leahy Scale: Fair Standing balance comment: CGA with RW                            Communication Communication Communication: No apparent difficulties  Cognition Arousal: Alert Behavior During Therapy: WFL for tasks assessed/performed   PT - Cognitive impairments: No apparent impairments                         Following commands: Intact      Cueing Cueing Techniques: Verbal cues, Tactile cues  Exercises      General Comments  Pertinent Vitals/Pain Pain Assessment Pain Assessment: No/denies pain    Home Living                          Prior Function            PT Goals (current goals can now be found in the care plan section) Acute Rehab PT Goals Patient Stated Goal: to feel better PT Goal Formulation: With patient Time For Goal Achievement: 12/16/23 Potential to Achieve Goals: Good Progress towards PT goals: Progressing toward goals    Frequency    Min  2X/week      PT Plan      Co-evaluation PT/OT/SLP Co-Evaluation/Treatment: Yes Reason for Co-Treatment: To address functional/ADL transfers PT goals addressed during session: Mobility/safety with mobility;Balance;Proper use of DME        AM-PAC PT 6 Clicks Mobility   Outcome Measure  Help needed turning from your back to your side while in a flat bed without using bedrails?: A Little Help needed moving from lying on your back to sitting on the side of a flat bed without using bedrails?: A Little Help needed moving to and from a bed to a chair (including a wheelchair)?: A Little Help needed standing up from a chair using your arms (e.g., wheelchair or bedside chair)?: A Lot Help needed to walk in hospital room?: A Lot Help needed climbing 3-5 steps with a railing? : A Lot 6 Click Score: 15    End of Session Equipment Utilized During Treatment: Gait belt Activity Tolerance: Patient limited by fatigue Patient left: in chair;with family/visitor present Nurse Communication: Mobility status PT Visit Diagnosis: Unsteadiness on feet (R26.81);Muscle weakness (generalized) (M62.81);Other abnormalities of gait and mobility (R26.89)     Time: 8561-8498 PT Time Calculation (min) (ACUTE ONLY): 23 min  Charges:    $Gait Training: 8-22 mins    Vyla Pint Romero-Perozo, SPT  12/09/2023, 3:28 PM

## 2023-12-09 NOTE — Progress Notes (Signed)
 2030: Pt called out asking to be sat on the [facility]. RN sat pt on [facility] and pt asked for a few minutes of privacy. RN sat call bell next to patient and encouraged her to call out, to not stand up alone. Pt stated understanding.  2035: RN in room to administer PM medications. Pt found standing at sink, brushing teeth. RN reminded pt to call before ambulating in the room alone. Pt stated I know but I just had to. RN put patient back to bed and prepared to administer medications. RN prepared to educate pt on names and purpose of each medication and pt stated yeah, yeah, I don't care, just give them to me.   Pt left in bed with side rails up, bed exit on and call bell within reach. Encouraged pt to call out for assistance.

## 2023-12-09 NOTE — Progress Notes (Addendum)
 Pharmacy Antibiotic Note  Mckenzie Lawrence is a 78 y.o. female admitted on 11/28/2023 with hypertensive urgency/emergency, possible new onset CHF, and acute renal failure on CKD. Patient has PMH of DM, lumbar spinal fusion, HLD, and CKD. Patient had PermCath placed on 6/16 and was started on HD. Blood cultures from 6/19 showing MRSA Bactermia. Permath removed 6/21.  Pharmacy has been consulted for Vancomycin  dosing.  Today, 12/09/2023 Day #3 vancomycin  Renal: AKI on CKD with new start of HD this admission with development of likely line infection with MRSA Patient with residual renal function (still making urine) WBC 13.8 afebrile 6/21 Cath tip culture: NGTD 6/22 repeat Bcx: NGTD 6/23 TEE: no evidence of vegetation  Vancomycin  levels: Vancomycin  1500mg  IV x 1 given 6/21 at 0929 6/22 Random vancomycin  level at 0657 = 13 mcg/ml 6/23 Random vancomycin  level at 0346 = 9 mcg/ml Vancomycin  1500mg  IV x1 given 6/23 at 1610 6/24 random vancomycin  level at 0509 = 27 mcg/ml  Plan: Based on random vancomycin  level, no vancomycin  dose needed today Repeat random vancomycin  level in morning to better evaluate clearance without HD.   Plan to redose when vancomycin  level < 20 mcg/ml pending repeat blood culture results and ability to re-place HD catheter and resume HD Current plan is to replace HD catheter 6/25 Follow up tip culture and blood culture results Await HD line placement and plan for antibiotic ID following Daptomycin  MIC ordered 6/24 - will be sent to [facility]  Height: 5' 7 (170.2 cm) Weight: 79.1 kg (174 lb 6.1 oz) IBW/kg (Calculated) : 61.6  Temp (24hrs), Avg:98.6 F (37 C), Min:98.1 F (36.7 C), Max:98.9 F (37.2 C)  Recent Labs  Lab 12/03/23 0352 12/04/23 0508 12/06/23 0750 12/07/23 0657 12/07/23 1219 12/08/23 0346 12/09/23 0509  WBC 12.1* 12.1* 15.6*  --  13.2*  --  13.8*  CREATININE 3.19* 2.59* 3.03*  --  2.65*  --  3.78*  VANCOTROUGH  --   --   --  12*  --   --   --    VANCORANDOM  --   --   --   --   --  9 27    Estimated Creatinine Clearance: 13.7 mL/min (A) (by C-G formula based on SCr of 3.78 mg/dL (H)).    Allergies  Allergen Reactions   Iodine Anaphylaxis and Other (See Comments)    Pt states that she is allergic to ingested iodine only, okay for betadine.     Iodine I-131 Tositumomab Anaphylaxis   Shellfish Allergy Anaphylaxis   Codeine Nausea And Vomiting   Morphine  Sulfate Nausea And Vomiting   Irbesartan Other (See Comments)     Unknown  (Avapro)   Sulfa Antibiotics Other (See Comments)    Fever     Antimicrobials this admission:  6/21 Vancomycin   >>   Dose adjustments this admission:   Microbiology results: 6/19  BCx: MRSA   Thank you for allowing pharmacy to be a part of this patient's care.  Sanjit Mcmichael, PharmD, BCPS, BCIDP Work Cell: [phone] 12/09/2023 8:02 AM

## 2023-12-09 NOTE — Plan of Care (Signed)
  Problem: Education: Goal: Ability to describe self-care measures that may prevent or decrease complications (Diabetes Survival Skills Education) will improve Outcome: Progressing   Problem: Coping: Goal: Ability to adjust to condition or change in health will improve Outcome: Progressing   Problem: Fluid Volume: Goal: Ability to maintain a balanced intake and output will improve Outcome: Progressing   Problem: Metabolic: Goal: Ability to maintain appropriate glucose levels will improve Outcome: Progressing   Problem: Skin Integrity: Goal: Risk for impaired skin integrity will decrease Outcome: Progressing   Problem: Nutritional: Goal: Maintenance of adequate nutrition will improve Outcome: Progressing   Problem: Tissue Perfusion: Goal: Adequacy of tissue perfusion will improve Outcome: Progressing   Problem: Clinical Measurements: Goal: Diagnostic test results will improve Outcome: Progressing   Problem: Nutrition: Goal: Adequate nutrition will be maintained Outcome: Progressing   Problem: Activity: Goal: Risk for activity intolerance will decrease Outcome: Progressing   Problem: Safety: Goal: Ability to remain free from injury will improve Outcome: Progressing   Problem: Activity: Goal: Capacity to carry out activities will improve Outcome: Progressing   Problem: Cardiac: Goal: Ability to achieve and maintain adequate cardiopulmonary perfusion will improve Outcome: Progressing   Problem: Respiratory: Goal: Ability to maintain a clear airway will improve Outcome: Progressing   Problem: Respiratory: Goal: Levels of oxygenation will improve Outcome: Progressing    Problem: Respiratory: Goal: Ability to maintain adequate ventilation will improve Outcome: Progressing   Plan of care, assessment, treatment, monitoring, and intervention (s) ongoing, see MAR see  flowsheet

## 2023-12-09 NOTE — Progress Notes (Signed)
 [facility] CARDIOLOGY PROGRESS NOTE       Patient ID: HAMNA ASA MRN: [id number] DOB/AGE: [birth year] 78 y.o.  Admit date: 11/28/2023 Referring Physician Dr. Delayne Solian Primary Physician [name], MD Primary Cardiologist Dr. Ammon Reason for Consultation diastolic heart failure with SOB.  HPI: PRICILA BRIDGE is a 78 y.o. female  with a past medical history of coronary artery disease, hypertension, hyperlipidemia, COPD, OSA, hx CVA, CKD stage 4, SIADH, diabetes mellitus who presented to the ED on 11/28/2023 for worsening and persistent SOB and lower extremity swelling with concurrent COPD exacerbation and found elevated BNP of 2500. Cardiology was consulted for further evaluation.   Interval History: -Patient seen and examined this afternoon. Patient sitting up on side of hospital bed with daughter at bedside. Patient states she feels okay. She states her Sob was not good last night but improving this AM. -Patients BP elevated and HR stable this AM. Per tele remains in SR, rate 70s -Patient remains on 4L with stable SpO2.    Review of systems complete and found to be negative unless listed above    Past Medical History:  Diagnosis Date   Anemia    Arthritis    Bladder incontinence    Broken foot    Cataracts, bilateral    Chronic kidney insufficiency    stage 3b   COPD (chronic obstructive pulmonary disease) (HCC)    wheezing   Coronary artery disease 04/25/2022   in [place]   COVID 2021   very mild case   CVA (cerebral vascular accident) (HCC) 2016   has had 3 strokes, states right side is slightlyweaker than left   Diabetes mellitus    insulin  dependent, Type 2   GERD (gastroesophageal reflux disease)    HLD (hyperlipidemia)    Hx of cardiovascular stress test    a. ETT (6/13):  Ex 5:13; no ischemic changes   Hypertension    controlled on meds   Lacunar stroke of left subthalamic region [facility]) 02/2015   Leg pain    left   Lower back pain     Neuromuscular disorder (HCC)    stroke right hand tingling   Orthostatic hypotension    Osteopenia 01/2017   T score -2.0 stable from prior DEXA   Pancreatitis 10/2021   PCOS (polycystic ovarian syndrome)    Personal history of tobacco use, presenting hazards to health 01/09/2015   PONV (postoperative nausea and vomiting)    Sleep apnea    uses CPAP nightly    Past Surgical History:  Procedure Laterality Date   BIOPSY  08/28/2022   Procedure: BIOPSY;  Surgeon: Elicia Claw, MD;  Location: [facility];  Service: Gastroenterology;;   BOTOX  INJECTION  01/15/2022   Procedure: BOTOX  INJECTION;  Surgeon: Rosalie Kitchens, MD;  Location: [facility];  Service: Gastroenterology;;   BOTOX  INJECTION N/A 08/28/2022   Procedure: BOTOX  INJECTION;  Surgeon: Elicia Claw, MD;  Location: [facility];  Service: Gastroenterology;  Laterality: N/A;   BOTOX  INJECTION Bilateral 05/27/2023   Procedure: BOTOX  INJECTION;  Surgeon: Rosalie Kitchens, MD;  Location: [facility];  Service: Gastroenterology;  Laterality: Bilateral;   BROW LIFT Bilateral 11/04/2017   Procedure: BLEPHAROPLASTY UPPER EYELID W/EXCESS SKIN;  Surgeon: Ashley Greig CHRISTELLA, MD;  Location: [facility];  Service: Ophthalmology;  Laterality: Bilateral;  DIABETES-insulin  dependent uses CPAP   CARDIAC CATHETERIZATION  20 yrs ago   found nothing   CARPAL TUNNEL RELEASE Bilateral    CATARACT EXTRACTION Bilateral    DIALYSIS/PERMA  CATHETER INSERTION N/A 12/01/2023   Procedure: DIALYSIS/PERMA CATHETER INSERTION;  Surgeon: Marea Selinda RAMAN, MD;  Location: [facility];  Service: Cardiovascular;  Laterality: N/A;   ELBOW SURGERY Bilateral    ESOPHAGOGASTRODUODENOSCOPY N/A 07/25/2021   Procedure: ESOPHAGOGASTRODUODENOSCOPY (EGD);  Surgeon: Toledo, Ladell POUR, MD;  Location: [facility];  Service: Gastroenterology;  Laterality: N/A;  IDDM   ESOPHAGOGASTRODUODENOSCOPY N/A 01/15/2022   Procedure: ESOPHAGOGASTRODUODENOSCOPY (EGD);  Surgeon: Rosalie Kitchens, MD;  Location: [facility];  Service: Gastroenterology;  Laterality: N/A;  botox    ESOPHAGOGASTRODUODENOSCOPY (EGD) WITH PROPOFOL  N/A 08/28/2022   Procedure: ESOPHAGOGASTRODUODENOSCOPY (EGD) WITH PROPOFOL ;  Surgeon: Elicia Claw, MD;  Location: [facility];  Service: Gastroenterology;  Laterality: N/A;   ESOPHAGOGASTRODUODENOSCOPY (EGD) WITH PROPOFOL  Bilateral 05/27/2023   Procedure: ESOPHAGOGASTRODUODENOSCOPY (EGD) WITH PROPOFOL ;  Surgeon: Rosalie Kitchens, MD;  Location: [facility];  Service: Gastroenterology;  Laterality: Bilateral;   FOOT SURGERY     Groin Abscess     HAND SURGERY     KNEE SURGERY Bilateral    LABIAL ABSCESS     LUMBAR LAMINECTOMY/DECOMPRESSION MICRODISCECTOMY Left 05/11/2018   Procedure: LUMBAR LAMINECTOMY/DECOMPRESSION MICRODISCECTOMY 1 LEVEL- L4-5;  Surgeon: Bluford Standing, MD;  Location: [facility];  Service: Neurosurgery;  Laterality: Left;   LUMBAR LAMINECTOMY/DECOMPRESSION MICRODISCECTOMY Left 05/20/2022   Procedure: MICRODISCECTOMY L3-4;  Surgeon: Mavis Purchase, MD;  Location: [facility];  Service: Neurosurgery;  Laterality: Left;  3C   LUMBAR WOUND DEBRIDEMENT N/A 08/08/2022   Procedure: INCISION AND DRAINAGE OF LUMBAR WOUND;  Surgeon: Mavis Purchase, MD;  Location: [facility] OR;  Service: Neurosurgery;  Laterality: N/A;  3C   OOPHORECTOMY     BSO   PUBO VAG SLING     SHOULDER SURGERY     bilateral arthroscopies   TEE WITHOUT CARDIOVERSION N/A 07/25/2023   Procedure: TRANSESOPHAGEAL ECHOCARDIOGRAM (TEE);  Surgeon: Hilarie Rocher, MD;  Location: [facility];  Service: Cardiovascular;  Laterality: N/A;   VAGINAL HYSTERECTOMY  1979    Medications Prior to Admission  Medication Sig Dispense Refill Last Dose/Taking   acetaminophen  (TYLENOL ) 500 MG tablet Take 1,000 mg by mouth every 6 (six) hours as needed for moderate pain.   Unknown   albuterol  (VENTOLIN  HFA) 108 (90 Base) MCG/ACT inhaler Inhale 2 puffs into the lungs every 6 (six) hours as needed for wheezing or  shortness of breath.   Unknown   aspirin  EC 81 MG tablet Take 81 mg by mouth at bedtime. Swallow whole.   Taking   calcitRIOL  (ROCALTROL ) 0.25 MCG capsule Take 0.25 mcg by mouth at bedtime.   Taking   cholecalciferol (VITAMIN D3) 25 MCG (1000 UNIT) tablet Take 1,000 Units by mouth daily.   Taking   cyclobenzaprine  (FLEXERIL ) 10 MG tablet Take 1 tablet (10 mg total) by mouth 3 (three) times daily as needed for muscle spasms. 30 tablet 0 Taking As Needed   D-MANNOSE PO Take 2 tablets by mouth at bedtime.   Taking   diazepam  (VALIUM ) 5 MG tablet Take 5 mg by mouth 2 (two) times daily as needed.   Taking As Needed   diphenhydrAMINE (BENADRYL) 25 MG tablet Take 50 mg by mouth at bedtime as needed for sleep.   Taking As Needed   docusate sodium  (COLACE) 100 MG capsule Take 1 capsule (100 mg total) by mouth 2 (two) times daily. (Patient taking differently: Take 100 mg by mouth at bedtime as needed for mild constipation or moderate constipation.) 30 capsule 0 Taking Differently   estradiol  (ESTRACE ) 0.1 MG/GM vaginal cream Place 1  Applicatorful vaginally 3 (three) times a week.   Taking   Fe Fum-Vit C-Vit B12-FA (TRIGELS-F FORTE) CAPS capsule Take 1 capsule by mouth daily after breakfast. 90 capsule 0 Taking   insulin  glargine-yfgn (SEMGLEE ) 100 UNIT/ML Pen Inject 10 Units into the skin 2 (two) times daily.   Taking   losartan  (COZAAR ) 100 MG tablet Take 1 tablet (100 mg total) by mouth daily. (Patient taking differently: Take 100 mg by mouth at bedtime.) 30 tablet 2 Taking Differently   metoprolol  succinate (TOPROL -XL) 100 MG 24 hr tablet Take 100 mg by mouth at bedtime.   Taking   Multiple Vitamins-Minerals (PRESERVISION AREDS 2+MULTI VIT PO) Take 1 capsule by mouth daily.   Taking   nitrofurantoin  (MACRODANTIN ) 100 MG capsule Take 100 mg by mouth at bedtime.   Taking   rosuvastatin  (CRESTOR ) 5 MG tablet Take 5 mg by mouth daily.   Taking   sennosides-docusate sodium  (SENOKOT-S) 8.6-50 MG tablet Take 1  tablet by mouth daily as needed for constipation.   Taking As Needed   sodium bicarbonate  650 MG tablet Take 1 tablet (650 mg total) by mouth 2 (two) times daily. (Patient taking differently: Take 650 mg by mouth daily.) 100 tablet 1 Taking Differently   sucralfate  (CARAFATE ) 1 g tablet Take 1 tablet (1 g total) by mouth 2 (two) times daily as needed (acid reflux).   Taking As Needed   Vibegron (GEMTESA) 75 MG TABS Take 75 mg by mouth at bedtime.   Taking   zolpidem  (AMBIEN ) 10 MG tablet Take 10 mg by mouth at bedtime as needed for sleep.   Taking As Needed   amLODipine  (NORVASC ) 10 MG tablet Take 1 tablet (10 mg total) by mouth daily. (Patient not taking: Reported on 11/28/2023) 30 tablet 2 Not Taking   BD INSULIN  SYRINGE U/F 31G X 5/16 1 ML MISC USE 1 SYRINGE AS DIRECTED      Continuous Glucose Sensor (FREESTYLE LIBRE 2 SENSOR) MISC       epoetin  alfa-epbx (RETACRIT ) 4000 UNIT/ML injection Inject 1 mL (4,000 Units total) into the skin once a week. (Patient not taking: Reported on 11/28/2023) 4 mL 3 Not Taking   feeding supplement (BOOST HIGH PROTEIN) LIQD Take 1 Container by mouth 2 (two) times daily between meals.      insulin  detemir (LEVEMIR ) 100 UNIT/ML injection Inject 100-120 Units into the skin daily. (Patient not taking: Reported on 11/28/2023)   Not Taking   nystatin  (MYCOSTATIN ) 100000 UNIT/ML suspension Take 5 mLs (500,000 Units total) by mouth 4 (four) times daily. (Patient not taking: Reported on 11/28/2023) 120 mL 1 Not Taking   ONETOUCH ULTRA test strip 4 (four) times daily.      Social History   Socioeconomic History   Marital status: Married    Spouse name: Not on file   Number of children: 1   Years of education: Not on file   Highest education level: Not on file  Occupational History   Not on file  Tobacco Use   Smoking status: Every Day    Current packs/day: 1.50    Average packs/day: 1.5 packs/day for 50.0 years (75.0 ttl pk-yrs)    Types: Cigarettes   Smokeless  tobacco: Never   Tobacco comments:    3ppd x 10 year, then cut back to 1.5pdd since 02/2015  Vaping Use   Vaping status: Never Used  Substance and Sexual Activity   Alcohol use: Never    Alcohol/week: 0.0 standard drinks of alcohol  Drug use: No   Sexual activity: Not Currently    Birth control/protection: Surgical    Comment: Hx Hysterectomy, 1st intercourse 78 yo-Fewer than 5 partners  Other Topics Concern   Not on file  Social History Narrative   Lives at home with husband in a one story home.  Has 1 daughter.     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     Education: some college.   Social Drivers of Corporate investment banker Strain: Low Risk  (10/31/2023)   Received from [facility]   Overall Financial Resource Strain (CARDIA)    Difficulty of Paying Living Expenses: Not hard at all  Food Insecurity: No Food Insecurity (11/28/2023)   Hunger Vital Sign    Worried About Running Out of Food in the Last Year: Never true    Ran Out of Food in the Last Year: Never true  Transportation Needs: No Transportation Needs (11/28/2023)   PRAPARE - Administrator, Civil Service (Medical): No    Lack of Transportation (Non-Medical): No  Physical Activity: Not on file  Stress: Not on file  Social Connections: Unknown (11/28/2023)   Social Connection and Isolation Panel    Frequency of Communication with Friends and Family: More than three times a week    Frequency of Social Gatherings with Friends and Family: More than three times a week    Attends Religious Services: More than 4 times per year    Active Member of [employer] or Organizations: Not on file    Attends Banker Meetings: Not on file    Marital Status: Not on file  Intimate Partner Violence: Not At Risk (11/28/2023)   Humiliation, Afraid, Rape, and Kick questionnaire    Fear of Current or Ex-Partner: No    Emotionally Abused: No    Physically Abused: No    Sexually Abused:  No    Family History  Problem Relation Age of Onset   Hypertension Mother    Heart disease Mother 43       MI   Diabetes Father    Diabetes Sister    Hypertension Sister    Diabetes Brother    Hypertension Brother    Heart disease Brother 51       CAD   Cancer Sister        Multiple myloma   Diabetes Brother      Vitals:   12/08/23 2315 12/09/23 0125 12/09/23 0332 12/09/23 0500  BP: (!) 110/56  (!) 138/46   Pulse:   78   Resp:   (!) 22   Temp:   98.5 F (36.9 C)   TempSrc:      SpO2:  98% 96%   Weight:    79.1 kg  Height:        PHYSICAL EXAM General: well appearing elderly female, well nourished, in no acute distress. HEENT: Normocephalic and atraumatic. Neck: No JVD.   Lungs: Normal respiratory effort on room air. Diminished breath sounds bilaterally, bibasilar crackles.  Heart: HRRR. Normal S1 and S2, + murmur  Abdomen: Non-distended appearing.  Msk: Normal strength and tone for age. Extremities: Warm and well perfused. No clubbing, cyanosis. 1+ pedal edema bilaterally.  Neuro: Alert and oriented X 3. Psych: Answers questions appropriately.   Labs: Basic Metabolic Panel: Recent Labs    12/06/23 0750 12/07/23 1219 12/09/23 0509  NA 130* 132* 132*  K 3.9 3.5 3.8  CL 93* 94* 95*  CO2 26 27 26   GLUCOSE 182* 238* 218*  BUN 32* 28* 41*  CREATININE 3.03* 2.65* 3.78*  CALCIUM  7.9* 7.7* 8.1*  PHOS 4.0 2.8  --    Liver Function Tests: Recent Labs    12/06/23 0750 12/07/23 1219  ALBUMIN 2.0* 1.8*    No results for input(s): LIPASE, AMYLASE in the last 72 hours. CBC: Recent Labs    12/07/23 1219 12/09/23 0509  WBC 13.2* 13.8*  HGB 7.1* 7.3*  HCT 21.6* 22.8*  MCV 93.9 93.1  PLT 150 209   Cardiac Enzymes: No results for input(s): CKTOTAL, CKMB, CKMBINDEX, TROPONINIHS in the last 72 hours. BNP: No results for input(s): BNP in the last 72 hours. D-Dimer: No results for input(s): DDIMER in the last 72 hours. Hemoglobin A1C: No  results for input(s): HGBA1C in the last 72 hours. Fasting Lipid Panel: No results for input(s): CHOL, HDL, LDLCALC, TRIG, CHOLHDL, LDLDIRECT in the last 72 hours. Thyroid Function Tests: Recent Labs    12/09/23 0509  TSH 2.099   Anemia Panel: No results for input(s): VITAMINB12, FOLATE, FERRITIN, TIBC, IRON, RETICCTPCT in the last 72 hours.   Radiology: ECHO TEE Result Date: 12/08/2023    TRANSESOPHOGEAL ECHO REPORT   Patient Name:   AYAKA ANDES Date of Exam: 12/08/2023 Medical Rec #:  [id number]      Height:       67.0 in Accession #:    [id number]     Weight:       173.7 lb Date of Birth:  08-Jan-1946      BSA:          1.905 m Patient Age:    78 years       BP:           131/52 mmHg Patient Gender: F              HR:           69 bpm. Exam Location:  [facility] Procedure: Transesophageal Echo, Cardiac Doppler, Color Doppler and 3D Echo            (Both Spectral and Color Flow Doppler were utilized during            procedure). Indications:     Bacteremia R78.81  History:         Patient has prior history of Echocardiogram examinations, most                  recent 12/07/2023. Risk Factors:Hypertension and Sleep Apnea.  Sonographer:     Christopher Furnace Referring Phys:  [id number] DWAYNE D CALLWOOD Diagnosing Phys: Keller Paterson PROCEDURE: After discussion of the risks and benefits of a TEE, an informed consent was obtained from the patient. The transesophogeal probe was passed without difficulty through the esophogus of the patient. Local oropharyngeal anesthetic was provided with Cetacaine . Sedation performed by different physician. The patient was monitored while under deep sedation. Image quality was excellent. The patient's vital signs; including heart rate, blood pressure, and oxygen saturation; remained stable throughout the procedure. The patient developed no complications during the procedure.  IMPRESSIONS  1. Left ventricular ejection fraction, by estimation, is 50 to 55%. The  left ventricle has low normal function.  2. Right ventricular systolic function is normal. The right ventricular size is normal.  3. No left atrial/left atrial appendage thrombus was detected.  4. The mitral valve is normal in structure. Trivial mitral valve regurgitation.  5. The aortic valve is tricuspid. There is  mild thickening of the aortic valve. Aortic valve regurgitation is not visualized.  6. 3D performed of the mitral valve and demonstrates no valvular vegetation. Conclusion(s)/Recommendation(s): No evidence of vegetation/infective endocarditis on this transesophageael echocardiogram. FINDINGS  Left Ventricle: Left ventricular ejection fraction, by estimation, is 50 to 55%. The left ventricle has low normal function. The left ventricular internal cavity size was normal in size. Right Ventricle: The right ventricular size is normal. No increase in right ventricular wall thickness. Right ventricular systolic function is normal. Left Atrium: Left atrial size was normal in size. No left atrial/left atrial appendage thrombus was detected. Right Atrium: Right atrial size was normal in size. Pericardium: There is no evidence of pericardial effusion. Mitral Valve: The mitral valve is normal in structure. Trivial mitral valve regurgitation. Tricuspid Valve: The tricuspid valve is normal in structure. Tricuspid valve regurgitation is trivial. Aortic Valve: The aortic valve is tricuspid. There is mild thickening of the aortic valve. Aortic valve regurgitation is not visualized. Pulmonic Valve: The pulmonic valve was normal in structure. Pulmonic valve regurgitation is trivial. Aorta: The aortic root is normal in size and structure. IAS/Shunts: No atrial level shunt detected by color flow Doppler. Additional Comments: 3D was performed not requiring image post processing on an independent workstation and was normal. Keller Paterson Electronically signed by Keller Paterson Signature Date/Time: 12/08/2023/5:20:23 PM     Final    ECHOCARDIOGRAM LIMITED Result Date: 12/07/2023    ECHOCARDIOGRAM LIMITED REPORT   Patient Name:   SAYLER MICKIEWICZ Date of Exam: 12/07/2023 Medical Rec #:  [id number]      Height:       67.0 in Accession #:    [id number]     Weight:       171.1 lb Date of Birth:  27-Jun-1945      BSA:          1.892 m Patient Age:    78 years       BP:           147/56 mmHg Patient Gender: F              HR:           62 bpm. Exam Location:  [facility] Procedure: Limited Echo (Both Spectral and Color Flow Doppler were utilized            during procedure). Indications:     Bacteremia R78.81  History:         Patient has prior history of Echocardiogram examinations, most                  recent 11/29/2023.  Sonographer:     Thedora Louder RDCS, FASE Referring Phys:  [phone] DWAYNE D CALLWOOD Diagnosing Phys: Cara JONETTA Lovelace MD  Sonographer Comments: Limited 2D echo requested no spectral or color flow doppler performed on this exam. IMPRESSIONS  1. Aotic valve valve slightly thickened.  2. Mitral valve wth mild thickening.  3. No obvious vegataion seen.  4. Recommed TEE.  5. Left ventricular ejection fraction, by estimation, is 60 to 65%. The left ventricle has normal function. The left ventricle has no regional wall motion abnormalities. Left ventricular diastolic function could not be evaluated.  6. Right ventricular systolic function is normal. The right ventricular size is normal.  7. The mitral valve is normal in structure.  8. The aortic valve is grossly normal. Aortic valve sclerosis is present, with no evidence of aortic valve stenosis. Conclusion(s)/Recommendation(s): Poor windows for evaluation of left  ventricular function by transthoracic echocardiography. Would recommend an alternative means of evaluation. No evidence of valvular vegetations on this transthoracic echocardiogram. Consider a transesophageal echocardiogram to exclude infective endocarditis if clinically indicated. FINDINGS  Left Ventricle: Left ventricular  ejection fraction, by estimation, is 60 to 65%. The left ventricle has normal function. The left ventricle has no regional wall motion abnormalities. The left ventricular internal cavity size was normal in size. There is  no left ventricular hypertrophy. Left ventricular diastolic function could not be evaluated. Right Ventricle: The right ventricular size is normal. No increase in right ventricular wall thickness. Right ventricular systolic function is normal. Left Atrium: Left atrial size was normal in size. Right Atrium: Right atrial size was normal in size. Pericardium: There is no evidence of pericardial effusion. Mitral Valve: The mitral valve is normal in structure. There is mild thickening of the mitral valve leaflet(s). There is mild calcification of the mitral valve leaflet(s). Normal mobility of the mitral valve leaflets. Tricuspid Valve: The tricuspid valve is normal in structure. Aortic Valve: The aortic valve is grossly normal. Aortic valve sclerosis is present, with no evidence of aortic valve stenosis. Pulmonic Valve: The pulmonic valve was normal in structure. Aorta: The ascending aorta was not well visualized. Additional Comments: No obvious vegataion seen. Aotic valve valve slightly thickened. Mitral valve wth mild thickening. Recommed TEE.  Dwayne D Callwood MD Electronically signed by Cara JONETTA Lovelace MD Signature Date/Time: 12/07/2023/1:28:01 PM    Final    DG Chest Port 1 View Result Date: 12/04/2023 CLINICAL DATA:  Fever EXAM: PORTABLE CHEST 1 VIEW COMPARISON:  11/28/2023 FINDINGS: Right internal jugular hemodialysis catheter tips are seen within the superior vena cava. Lungs appear mildly hyperinflated, stable since prior examination, suggesting changes of underlying COPD. Small right pleural effusion again noted, slightly decreased since prior examination. Focal atelectasis or infiltrate within the right costophrenic angle. Mild diffuse interstitial thickening is again noted in keeping  with interstitial pulmonary edema or airway inflammation. No pneumothorax. Stable cardiomegaly. No acute bone abnormality. IMPRESSION: 1. Stable cardiomegaly. 2. Small right pleural effusion, slightly decreased since prior examination. 3. Stable interstitial thickening in keeping with interstitial pulmonary edema or airway inflammation. Electronically Signed   By: Dorethia Molt M.D.   On: 12/04/2023 20:43   PERIPHERAL VASCULAR CATHETERIZATION Result Date: 12/01/2023 See surgical note for result.  ECHOCARDIOGRAM COMPLETE Result Date: 11/29/2023    ECHOCARDIOGRAM REPORT   Patient Name:   NIKKI GLANZER Date of Exam: 11/29/2023 Medical Rec #:  [id number]      Height:       67.0 in Accession #:    [id number]     Weight:       176.6 lb Date of Birth:  [birth year]      BSA:          1.918 m Patient Age:    78 years       BP:           163/51 mmHg Patient Gender: F              HR:           81 bpm. Exam Location:  [facility] Procedure: 2D Echo, Cardiac Doppler and Color Doppler (Both Spectral and Color            Flow Doppler were utilized during procedure). Indications:     CHF I50.31  History:         Patient has prior history of Echocardiogram examinations, most  recent 07/23/2023.  Sonographer:     Thedora Louder RDCS, FASE Referring Phys:  [id number] ANTHONY CHRISTELLA POUCH Diagnosing Phys: Dwayne D Callwood MD IMPRESSIONS  1. Left ventricular ejection fraction, by estimation, is 55 to 60%. The left ventricle has normal function. The left ventricle has no regional wall motion abnormalities. The left ventricular internal cavity size was moderately dilated. Left ventricular diastolic parameters are consistent with Grade II diastolic dysfunction (pseudonormalization).  2. Right ventricular systolic function is normal. The right ventricular size is normal.  3. Left atrial size was mildly dilated.  4. The mitral valve is normal in structure. Trivial mitral valve regurgitation.  5. The aortic valve is normal in  structure. Aortic valve regurgitation is not visualized. FINDINGS  Left Ventricle: Left ventricular ejection fraction, by estimation, is 55 to 60%. The left ventricle has normal function. The left ventricle has no regional wall motion abnormalities. Strain was performed and the global longitudinal strain is indeterminate. The left ventricular internal cavity size was moderately dilated. There is no left ventricular hypertrophy. Left ventricular diastolic parameters are consistent with Grade II diastolic dysfunction (pseudonormalization). Right Ventricle: The right ventricular size is normal. No increase in right ventricular wall thickness. Right ventricular systolic function is normal. Left Atrium: Left atrial size was mildly dilated. Right Atrium: Right atrial size was normal in size. Pericardium: There is no evidence of pericardial effusion. Mitral Valve: The mitral valve is normal in structure. Trivial mitral valve regurgitation. Tricuspid Valve: The tricuspid valve is normal in structure. Tricuspid valve regurgitation is mild. Aortic Valve: The aortic valve is normal in structure. Aortic valve regurgitation is not visualized. Aortic valve peak gradient measures 8.8 mmHg. Pulmonic Valve: The pulmonic valve was normal in structure. Pulmonic valve regurgitation is not visualized. Aorta: The ascending aorta was not well visualized. IAS/Shunts: No atrial level shunt detected by color flow Doppler. Additional Comments: 3D was performed not requiring image post processing on an independent workstation and was indeterminate.  LEFT VENTRICLE PLAX 2D LVIDd:         5.50 cm     Diastology LVIDs:         3.80 cm     LV e' medial:    7.18 cm/s LV PW:         1.00 cm     LV E/e' medial:  19.9 LV IVS:        1.10 cm     LV e' lateral:   8.38 cm/s LVOT diam:     1.80 cm     LV E/e' lateral: 17.1 LV SV:         63 LV SV Index:   33 LVOT Area:     2.54 cm  LV Volumes (MOD) LV vol d, MOD A2C: 74.1 ml LV vol d, MOD A4C: 70.9 ml LV  vol s, MOD A2C: 37.0 ml LV vol s, MOD A4C: 28.8 ml LV SV MOD A2C:     37.1 ml LV SV MOD A4C:     70.9 ml LV SV MOD BP:      41.0 ml RIGHT VENTRICLE RV Basal diam:  3.20 cm RV S prime:     14.80 cm/s TAPSE (M-mode): 2.6 cm LEFT ATRIUM             Index        RIGHT ATRIUM           Index LA diam:        4.30 cm 2.24 cm/m   RA  Area:     19.70 cm LA Vol (A2C):   44.9 ml 23.41 ml/m  RA Volume:   55.40 ml  28.88 ml/m LA Vol (A4C):   53.8 ml 28.05 ml/m LA Biplane Vol: 51.9 ml 27.06 ml/m  AORTIC VALVE                 PULMONIC VALVE AV Area (Vmax): 1.84 cm     PV Vmax:        1.01 m/s AV Vmax:        148.00 cm/s  PV Peak grad:   4.1 mmHg AV Peak Grad:   8.8 mmHg     RVOT Peak grad: 3 mmHg LVOT Vmax:      107.00 cm/s LVOT Vmean:     76.000 cm/s LVOT VTI:       0.249 m  AORTA Ao Root diam: 2.90 cm MITRAL VALVE MV Area (PHT): 4.86 cm     SHUNTS MV Decel Time: 156 msec     Systemic VTI:  0.25 m MV E velocity: 143.00 cm/s  Systemic Diam: 1.80 cm MV A velocity: 80.30 cm/s MV E/A ratio:  1.78 Dwayne D Callwood MD Electronically signed by Cara JONETTA Lovelace MD Signature Date/Time: 11/29/2023/10:33:04 AM    Final    US  Venous Img Lower Bilateral Result Date: 11/28/2023 CLINICAL DATA:  New onset left greater than right lower extremity swelling, without findings of CHF. EXAM: BILATERAL LOWER EXTREMITY VENOUS DOPPLER ULTRASOUND TECHNIQUE: Gray-scale sonography with graded compression, as well as color Doppler and duplex ultrasound were performed to evaluate the lower extremity deep venous systems from the level of the common femoral vein and including the common femoral, femoral, profunda femoral, popliteal and calf veins including the posterior tibial, peroneal and gastrocnemius veins when visible. The superficial great saphenous vein was also interrogated. Spectral Doppler was utilized to evaluate flow at rest and with distal augmentation maneuvers in the common femoral, femoral and popliteal veins. COMPARISON:  Left lower  extremity DVT exam 08/26/2019. FINDINGS: RIGHT LOWER EXTREMITY Common Femoral Vein: No evidence of thrombus. Normal compressibility, respiratory phasicity and response to augmentation. Saphenofemoral Junction: No evidence of thrombus. Normal compressibility and flow on color Doppler imaging. Profunda Femoral Vein: No evidence of thrombus. Normal compressibility and flow on color Doppler imaging. Femoral Vein: No evidence of thrombus. Normal compressibility, respiratory phasicity and response to augmentation. Popliteal Vein: No evidence of thrombus. Normal compressibility, respiratory phasicity and response to augmentation. Calf Veins: No evidence of thrombus. Normal compressibility and flow on color Doppler imaging. Superficial Great Saphenous Vein: No evidence of thrombus. Normal compressibility. Venous Reflux:  None. Other Findings:  Pulsatile venous waveforms. LEFT LOWER EXTREMITY Common Femoral Vein: No evidence of thrombus. Normal compressibility, respiratory phasicity and response to augmentation. Saphenofemoral Junction: No evidence of thrombus. Normal compressibility and flow on color Doppler imaging. Profunda Femoral Vein: No evidence of thrombus. Normal compressibility and flow on color Doppler imaging. Femoral Vein: No evidence of thrombus. Normal compressibility, respiratory phasicity and response to augmentation. Popliteal Vein: No evidence of thrombus. Normal compressibility, respiratory phasicity and response to augmentation. Calf Veins: No evidence of thrombus. Normal compressibility and flow on color Doppler imaging. Superficial Great Saphenous Vein: No evidence of thrombus. Normal compressibility. Venous Reflux:  None. Other Findings:  Pulsatile venous waveforms. IMPRESSION: 1. No evidence of deep venous thrombosis in either lower extremity. 2. Pulsatile venous waveforms, which can be seen with right heart failure or tricuspid regurgitation. Electronically Signed   By: Francis Beatriz HERO.D.  On:  11/28/2023 04:56   DG Chest Portable 1 View Result Date: 11/28/2023 CLINICAL DATA:  Difficulty breathing EXAM: PORTABLE CHEST 1 VIEW COMPARISON:  07/19/2023 FINDINGS: Cardiac shadow is mildly enlarged. Aortic calcifications are seen. Mild vascular congestion is noted. Small right-sided pleural effusion is noted. Metallic densities are noted symmetrically over the upper chest likely artifactual in nature. IMPRESSION: Small right pleural effusion and vascular congestion. Electronically Signed   By: Oneil Devonshire M.D.   On: 11/28/2023 02:59    ECHO as above  TELEMETRY reviewed by me 12/09/2023: sinus rhythm, rate 70s   EKG reviewed by me: Normal sinus rhythm, mild right axis nonspecific ST-T wave changes   Data reviewed by me 12/09/2023: last 24h vitals tele labs imaging I/O hospitalist progress note, nephrology notes.  Principal Problem:   Hypertensive urgency/emergency Active Problems:   Hyponatremia   CAD (coronary artery disease)   COPD with acute exacerbation (HCC)   Acute renal failure superimposed on stage 4 chronic kidney disease (HCC)   Chest pain   Hypertensive emergency   Acute dyspnea   Encounter for dialysis catheter care (HCC)   Paroxysmal atrial fibrillation (HCC)    ASSESSMENT AND PLAN:  JALONI SORBER is a 78 y.o. female  with a past medical history of coronary artery disease, hypertension, hyperlipidemia, COPD, OSA, hx CVA, CKD stage 4, SIADH, diabetes mellitus who presented to the ED on 11/28/2023 for worsening and persistent SOB and lower extremity swelling with concurrent COPD exacerbation and found elevated BNP of 2500. Cardiology was consulted for further evaluation.   # New AF RVR Patient developed AF RVR 140s early this morning (06/23). Per tele s/p IV dilt 10 mg HF improved to 110s-120s. Patient converted to SR with IV dilt gtt on 06/23. Per tele remains in SR rate 70s. -Consolidate diltiazem to 120 mg daily. -Continue Eliquis 5 mg BID for stroke risk  reduction. -CHA2DS2VASc score of 9  (in the setting of age, sex, CHF, HTN, Stroke history, vascular disease, and diabetes history).   # MRSA bacteremia Blood cultures positive for MRSA. TEE (06/23) revealed no evidence of vegetation/infective  Endocarditis. -IV broad spectrum antibiotics per primary team.  -Infectious disease following, appreciate recommendations.  # ESRD (Plan to start dialysis on 06/16) Permacath placement on 06/17 by Dr. Marea. Patient underwent dialysis, tolerated well. Permacath removed due to positive MRSA blood cultures.  -Plan to replace permacath tomorrow. -Nephrology following, appreciate recommendations.   # Acute HFpEF exacerbation # COPD exacerbation  # OSA Patient presents with worsening SOB and LE swelling. BNP elevated at 2450. Echo this admission with pEF, no RWMA, grade II diastolic dysfunction.  -Continue metoprolol  succinate 100 mg daily.  -Continue losartan  as stated below.  -Fluid removal via dialysis.  -GDMT optimization limited due to ESRD.  -Recommend sleep study for OSA if CPAP indicated.  -Recommend breathing treatments and frequent use of incentive spirometry for COPD. Management per primary. -Recommend pulmonology consultation.   # Coronary artery disease # Hypertension # Hyperlipidemia # Demand ischemia Patient without chest pain. Trops minimally elevated and flat 19 > 20. EKG without acute ischemic changes. Echo with no RWMA.   -Continue ASA 81 mg, rosuvastatin  5 mg daily -Continue losartan  100 mg daily. -Continue clonidine  0.2 mg BID.  -Continue metoprolol  as stated above. -Minimally elevated and flat trop sin setting of ESRD, heart failure exacerbation is most consistent with demand/supply mismatch and not ACS.  No further cardiac recommendations at this time.  Will arrange for follow up in  clinic with Dr. Ammon in 1-2 weeks.   This patient's plan of care was discussed and created with Dr. Florencio and he is in  agreement.  Signed: Dorene Comfort, PA-C  12/09/2023, 7:20 AM [facility]

## 2023-12-09 NOTE — Progress Notes (Signed)
 Occupational Therapy Treatment Patient Details Name: Mckenzie Lawrence MRN: [id number] DOB: February 10, 1946 Today's Date: 12/09/2023   History of present illness 78 y/o female presented to ED on 11/28/23 for worsening SOB and chest pressure. Admitted for ESRD and acute CHF exacerbation. PMH: CAD, HTN, COPD, OSA, hx of CVA, CKD stage 4, SIADH, T2DM   OT comments  Pt is found seated in chair by bed with daughter present on arrival. Pt on RA with sp02 at 94%. Agreeable to PT/OT co-tx session to maximize progression of mobility. She denies pain. Pt performed STS from chair, standing grooming tasks at sink and SPT to [facility] with CGA. Seated peri-care with supervision and standing clothing management with Min A. Pt ambulated x2 bouts of ~10 ft using RW with CGA x1 and chair follow x1 d/t increased fatigue with all activity. HR and sp02 stable on RA throughout session with readings >90%. Pt left with her daughter in transport chair to be taken outside with approval from MD and nurse.  She remains very limited and far from baseline function. She will cont to require skilled acute OT services to maximize her safety and IND to return to PLOF.       If plan is discharge home, recommend the following:  A little help with walking and/or transfers;Help with stairs or ramp for entrance;Assistance with cooking/housework;A lot of help with bathing/dressing/bathroom   Equipment Recommendations  Other (comment) (defer to next venue)    Recommendations for Other Services      Precautions / Restrictions Precautions Precautions: Fall Recall of Precautions/Restrictions: Intact Restrictions Weight Bearing Restrictions Per Provider Order: No       Mobility Bed Mobility               General bed mobility comments: NT found sitting in chair by the bed with daughter present    Transfers Overall transfer level: Needs assistance Equipment used: Rolling walker (2 wheels) Transfers: Sit to/from Stand Sit to Stand:  Contact guard assist     Step pivot transfers: Contact guard assist     General transfer comment: CGA for STS from chair, SPT to BSC and ambulated x2 bouts of ~10 ft with RW and CGA with chair follow d/t pt fatiguing easily     Balance Overall balance assessment: Needs assistance Sitting-balance support: No upper extremity supported, Feet supported Sitting balance-Leahy Scale: Fair     Standing balance support: Bilateral upper extremity supported, During functional activity, Reliant on assistive device for balance Standing balance-Leahy Scale: Fair Standing balance comment: CGA with RW                           ADL either performed or assessed with clinical judgement   ADL Overall ADL's : Needs assistance/impaired     Grooming: Oral care;Wash/dry hands;Wash/dry face;Standing;Contact guard assist;Brushing hair                   Toilet Transfer: BSC/3in1;Rolling walker (2 wheels);Stand-pivot;Contact guard assist   Toileting- Clothing Manipulation and Hygiene: Sitting/lateral lean;Contact guard assist;Minimal assistance Toileting - Clothing Manipulation Details (indicate cue type and reason): anterior hygiene CGA, Min A for clothing management in standing     Functional mobility during ADLs: Contact guard assist;Rolling walker (2 wheels) General ADL Comments: very limited distances d/t fatigue    Extremity/Trunk Assessment              Vision       Perception  Praxis     Communication Communication Communication: No apparent difficulties   Cognition Arousal: Alert Behavior During Therapy: WFL for tasks assessed/performed                                 Following commands: Intact        Cueing   Cueing Techniques: Verbal cues, Tactile cues  Exercises      Shoulder Instructions       General Comments sp02 stable on RA at 90-94% throughout, although pt endorses continued fatigue    Pertinent Vitals/ Pain       Pain  Assessment Pain Assessment: No/denies pain  Home Living                                          Prior Functioning/Environment              Frequency  Min 2X/week        Progress Toward Goals  OT Goals(current goals can now be found in the care plan section)  Progress towards OT goals: Progressing toward goals  Acute Rehab OT Goals Patient Stated Goal: improve endurance OT Goal Formulation: With patient/family Time For Goal Achievement: 12/17/23 Potential to Achieve Goals: Good  Plan      Co-evaluation    PT/OT/SLP Co-Evaluation/Treatment: Yes Reason for Co-Treatment: To address functional/ADL transfers PT goals addressed during session: Mobility/safety with mobility;Balance;Proper use of DME        AM-PAC OT 6 Clicks Daily Activity     Outcome Measure   Help from another person eating meals?: None Help from another person taking care of personal grooming?: None Help from another person toileting, which includes using toliet, bedpan, or urinal?: A Little Help from another person bathing (including washing, rinsing, drying)?: A Little Help from another person to put on and taking off regular upper body clothing?: None Help from another person to put on and taking off regular lower body clothing?: A Lot 6 Click Score: 20    End of Session Equipment Utilized During Treatment: Rolling walker (2 wheels);Gait belt  OT Visit Diagnosis: Other abnormalities of gait and mobility (R26.89);Muscle weakness (generalized) (M62.81)   Activity Tolerance Patient tolerated treatment well;Patient limited by fatigue   Patient Left with family/visitor present;in chair (with daughter in transport chair)   Nurse Communication Mobility status        Time: 8563-8497 OT Time Calculation (min): 26 min  Charges: OT General Charges $OT Visit: 1 Visit OT Treatments $Self Care/Home Management : 8-22 mins  [name], OTR/L  12/09/23, 4:08  PM   Mckenzie Lawrence 12/09/2023, 4:06 PM

## 2023-12-09 NOTE — H&P (View-Only) (Signed)
 Progress Note    12/09/2023 2:52 PM 1 Day Post-Op  Subjective: Mckenzie Lawrence is a 78 year old female who presented to [facility] emergency department in renal failure.  Patient needed long-term dialysis started for her end-stage renal disease.  A permacatheter was placed by Dr. Selinda Gu on 12/01/2023.  This past weekend on 12/06/2023 patient was deemed to be bacteremic.  A source of her bacteremia could have possibly been her dialysis permacatheter.  Dr. Curry Beat covering for vascular surgery service remove the permacatheter that had been placed.  Patient now needs new access for her end-stage renal disease for hemodialysis.  Vascular surgery was contacted again for placement of a dialysis permacatheter.   Vitals:   12/09/23 1133 12/09/23 1224  BP: (!) 135/47 (!) 135/47  Pulse: 75   Resp: 20   Temp: 100.2 F (37.9 C)   SpO2: 98%    Physical Exam: Cardiac:  RRR, normal S1 and S2, no rubs clicks gallops or murmurs noted. Lungs: Lungs are clear throughout on auscultation this morning.  No rales rhonchi or wheezing. Incisions: Right chest incision with tunnel catheter placement.  No signs or symptoms of hematoma seroma or infection. Extremities: All extremities are warm to touch with palpable pulses. Abdomen: Positive bowel sounds throughout on auscultation.  Soft, nontender nondistended. Neurologic: Alert and oriented x 3, answers all questions follows commands appropriately.  CBC    Component Value Date/Time   WBC 13.8 (H) 12/09/2023 0509   RBC 2.45 (L) 12/09/2023 0509   HGB 7.3 (L) 12/09/2023 0509   HGB 13.6 12/01/2011 1656   HCT 22.8 (L) 12/09/2023 0509   HCT 40.9 12/01/2011 1656   PLT 209 12/09/2023 0509   PLT 299 12/01/2011 1656   MCV 93.1 12/09/2023 0509   MCV 95 12/01/2011 1656   MCH 29.8 12/09/2023 0509   MCHC 32.0 12/09/2023 0509   RDW 14.2 12/09/2023 0509   RDW 13.1 12/01/2011 1656   LYMPHSABS 0.6 (L) 11/28/2023 0246   MONOABS 0.7 11/28/2023 0246   EOSABS 0.0  11/28/2023 0246   BASOSABS 0.0 11/28/2023 0246    BMET    Component Value Date/Time   NA 132 (L) 12/09/2023 0509   NA 137 12/01/2011 1656   K 3.8 12/09/2023 0509   K 4.3 12/01/2011 1656   CL 95 (L) 12/09/2023 0509   CL 104 12/01/2011 1656   CO2 26 12/09/2023 0509   CO2 23 12/01/2011 1656   GLUCOSE 218 (H) 12/09/2023 0509   GLUCOSE 116 (H) 12/01/2011 1656   BUN 41 (H) 12/09/2023 0509   BUN 12 12/01/2011 1656   CREATININE 3.78 (H) 12/09/2023 0509   CREATININE 1.15 12/01/2011 1656   CALCIUM  8.1 (L) 12/09/2023 0509   CALCIUM  8.5 12/01/2011 1656   GFRNONAA 12 (L) 12/09/2023 0509   GFRNONAA 50 (L) 12/01/2011 1656   GFRAA 43 (L) 05/06/2018 1436   GFRAA 58 (L) 12/01/2011 1656    INR    Component Value Date/Time   INR 1.4 (H) 07/19/2023 2040     Intake/Output Summary (Last 24 hours) at 12/09/2023 1452 Last data filed at 12/09/2023 0845 Gross per 24 hour  Intake 200 ml  Output 300 ml  Net -100 ml     Assessment/Plan:  78 y.o. female is s/p dialysis permacatheter placement did not have dialysis permacatheter removed due to bacteremia.  1 Day Post-Op   PLAN  Vascular surgery plans on taking the patient to the vascular lab on 12/10/2023 for dialysis permacatheter placement for long-term hemodialysis.  I had a long detailed discussion at the bedside with the patient regarding the procedure, benefits, risk, complications.  She verbalized understanding wishes to proceed.  She does not have any other questions today as this is the second permacatheter placement.  Patient will be made n.p.o. after midnight tonight for procedure tomorrow.  Medical team was made aware to hold tomorrow morning's Eliquis dose.   DVT prophylaxis: Eliquis 5 mg twice daily, aspirin  81 mg daily.   Mckenzie Lawrence [facility] 12/09/2023 2:52 PM

## 2023-12-09 NOTE — Progress Notes (Signed)
 [facility]  ROUNDING NOTE   Subjective:  [name] is a 78 y.o.  female with past medical history of hypertension, long-standing diabetes mellitus type 2, lower extremity edema, hyperlipidemia, tobacco abuse, prior history of CVA, COPD, obstructive sleep apnea, lumbar spinal fusion, left upper lobe lung mass who presents with volume overload and increasing shortness of breath.   Update: TEE was found to be negative. Remains catheter free at the moment. Case discussed with cardiology and infectious disease today. We are planning to replace PermCath tomorrow.   Objective:  Vital signs in last 24 hours:  Temp:  [98.1 F (36.7 C)-98.9 F (37.2 C)] 98.8 F (37.1 C) (06/24 0801) Pulse Rate:  [60-87] 72 (06/24 0801) Resp:  [14-35] 20 (06/24 0801) BP: (108-145)/(42-61) 143/60 (06/24 0833) SpO2:  [76 %-100 %] 94 % (06/24 0801) Weight:  [79.1 kg] 79.1 kg (06/24 0500)  Weight change: 0.3 kg Filed Weights   12/07/23 0500 12/08/23 0500 12/09/23 0500  Weight: 77.6 kg 78.8 kg 79.1 kg    Intake/Output: I/O last 3 completed shifts: In: 360 [P.O.:110; I.V.:250] Out: 700 [Urine:700]   Intake/Output this shift:  No intake/output data recorded.  Physical Exam: General: No acute distress  Head: Normocephalic, atraumatic. Dry mucosal membranes  Eyes: Anicteric  Neck: Supple  Lungs:  Normal breathing effort  Heart: Regular rate and rhythm  Abdomen:  Soft, nontender, BS present  Extremities: No peripheral edema.  Neurologic: Nonfocal, moving all four extremities  Skin: No lesions  Dialysis Access: Right IJ PermCath    Basic Metabolic Panel: Recent Labs  Lab 12/03/23 0352 12/04/23 0508 12/06/23 0750 12/07/23 1219 12/09/23 0509  NA 130* 131* 130* 132* 132*  K 4.7 4.2 3.9 3.5 3.8  CL 98 97* 93* 94* 95*  CO2 24 26 26 27 26   GLUCOSE 224* 116* 182* 238* 218*  BUN 61* 38* 32* 28* 41*  CREATININE 3.19* 2.59* 3.03* 2.65* 3.78*  CALCIUM  8.1* 8.0* 7.9* 7.7*  8.1*  PHOS  --   --  4.0 2.8  --     Liver Function Tests: Recent Labs  Lab 12/06/23 0750 12/07/23 1219  ALBUMIN 2.0* 1.8*   No results for input(s): LIPASE, AMYLASE in the last 168 hours. No results for input(s): AMMONIA in the last 168 hours.  CBC: Recent Labs  Lab 12/03/23 0352 12/04/23 0508 12/06/23 0750 12/07/23 1219 12/09/23 0509  WBC 12.1* 12.1* 15.6* 13.2* 13.8*  HGB 7.8* 8.6* 8.2* 7.1* 7.3*  HCT 23.6* 27.7* 25.2* 21.6* 22.8*  MCV 90.1 93.9 94.0 93.9 93.1  PLT 222 228 193 150 209    Cardiac Enzymes: No results for input(s): CKTOTAL, CKMB, CKMBINDEX, TROPONINI in the last 168 hours.  BNP: Invalid input(s): POCBNP  CBG: Recent Labs  Lab 12/08/23 0745 12/08/23 1118 12/08/23 1634 12/08/23 2154 12/09/23 0759  GLUCAP 186* 204* 187* 255* 216*    Microbiology: Results for orders placed or performed during the hospital encounter of 11/28/23  Culture, blood (Routine X 2) w Reflex to ID Panel     Status: Abnormal   Collection Time: 12/04/23  6:34 PM   Specimen: BLOOD  Result Value Ref Range Status   Specimen Description   Final    BLOOD BLOOD RIGHT ARM Performed at [facility], [address] [place], [place] [address]    Special Requests   Final    BOTTLES DRAWN AEROBIC AND ANAEROBIC Blood Culture adequate volume Performed at [facility], [address], [place], [place]  [address]    Culture  Setup Time   Final    Organism ID to follow IN BOTH AEROBIC AND ANAEROBIC BOTTLES GRAM POSITIVE COCCI CRITICAL RESULT CALLED TO, READ BACK BY AND VERIFIED WITH: JASON ROBBINS 12/06/2023 AT 9351 SRR Performed at [facility], [address] [place], [place] [address]    Culture METHICILLIN RESISTANT STAPHYLOCOCCUS AUREUS (A)  Final   Report Status 12/08/2023 FINAL  Final   Organism ID, Bacteria METHICILLIN RESISTANT STAPHYLOCOCCUS AUREUS  Final      Susceptibility   Methicillin resistant staphylococcus aureus -  MIC*    CIPROFLOXACIN  >=8 RESISTANT Resistant     ERYTHROMYCIN  >=8 RESISTANT Resistant     GENTAMICIN  <=0.5 SENSITIVE Sensitive     OXACILLIN >=4 RESISTANT Resistant     TETRACYCLINE <=1 SENSITIVE Sensitive     VANCOMYCIN  2 SENSITIVE Sensitive     TRIMETH/SULFA <=10 SENSITIVE Sensitive     CLINDAMYCIN <=0.25 SENSITIVE Sensitive     RIFAMPIN <=0.5 SENSITIVE Sensitive     Inducible Clindamycin NEGATIVE Sensitive     LINEZOLID  2 SENSITIVE Sensitive     * METHICILLIN RESISTANT STAPHYLOCOCCUS AUREUS  Culture, blood (Routine X 2) w Reflex to ID Panel     Status: Abnormal   Collection Time: 12/04/23  6:34 PM   Specimen: BLOOD  Result Value Ref Range Status   Specimen Description   Final    BLOOD BLOOD LEFT ARM Performed at [facility], [address] [place], [place] [address]    Special Requests   Final    BOTTLES DRAWN AEROBIC AND ANAEROBIC Blood Culture adequate volume Performed at [facility], [address] [place], [place] [address]    Culture  Setup Time   Final    GRAM POSITIVE COCCI IN BOTH AEROBIC AND ANAEROBIC BOTTLES CRITICAL VALUE NOTED.  VALUE IS CONSISTENT WITH PREVIOUSLY REPORTED AND CALLED VALUE. Performed at [facility], [address] [place], [place] [address]    Culture (A)  Final    STAPHYLOCOCCUS AUREUS SUSCEPTIBILITIES PERFORMED ON PREVIOUS CULTURE WITHIN THE LAST 5 DAYS. Performed at [facility], [address], [place], [place] [address]    Report Status 12/08/2023 FINAL  Final  Blood Culture ID Panel (Reflexed)     Status: Abnormal   Collection Time: 12/04/23  6:34 PM  Result Value Ref Range Status   Enterococcus faecalis NOT DETECTED NOT DETECTED Final   Enterococcus Faecium NOT DETECTED NOT DETECTED Final   Listeria monocytogenes NOT DETECTED NOT DETECTED Final   Staphylococcus species DETECTED (A) NOT DETECTED Final    Comment: CRITICAL RESULT CALLED TO, READ BACK BY AND VERIFIED WITH: JASON ROBBINS  12/06/2023 AT 0648 SRR    Staphylococcus aureus (BCID) DETECTED (A) NOT DETECTED Final    Comment: Methicillin (oxacillin)-resistant Staphylococcus aureus (MRSA). MRSA is predictably resistant to beta-lactam antibiotics (except ceftaroline). Preferred therapy is vancomycin  unless clinically contraindicated. Patient requires contact precautions if  hospitalized. CRITICAL RESULT CALLED TO, READ BACK BY AND VERIFIED WITH: JASON ROBBINS 12/06/2023 AT 0648 SRR    Staphylococcus epidermidis NOT DETECTED NOT DETECTED Final   Staphylococcus lugdunensis NOT DETECTED NOT DETECTED Final   Streptococcus species NOT DETECTED NOT DETECTED Final   Streptococcus agalactiae NOT DETECTED NOT DETECTED Final   Streptococcus pneumoniae NOT DETECTED NOT DETECTED Final   Streptococcus pyogenes NOT DETECTED NOT DETECTED Final   A.calcoaceticus-baumannii NOT DETECTED NOT DETECTED Final   Bacteroides fragilis NOT DETECTED NOT DETECTED Final   Enterobacterales NOT DETECTED NOT  DETECTED Final   Enterobacter cloacae complex NOT DETECTED NOT DETECTED Final   Escherichia coli NOT DETECTED NOT DETECTED Final   Klebsiella aerogenes NOT DETECTED NOT DETECTED Final   Klebsiella oxytoca NOT DETECTED NOT DETECTED Final   Klebsiella pneumoniae NOT DETECTED NOT DETECTED Final   Proteus species NOT DETECTED NOT DETECTED Final   Salmonella species NOT DETECTED NOT DETECTED Final   Serratia marcescens NOT DETECTED NOT DETECTED Final   Haemophilus influenzae NOT DETECTED NOT DETECTED Final   Neisseria meningitidis NOT DETECTED NOT DETECTED Final   Pseudomonas aeruginosa NOT DETECTED NOT DETECTED Final   Stenotrophomonas maltophilia NOT DETECTED NOT DETECTED Final   Candida albicans NOT DETECTED NOT DETECTED Final   Candida auris NOT DETECTED NOT DETECTED Final   Candida glabrata NOT DETECTED NOT DETECTED Final   Candida krusei NOT DETECTED NOT DETECTED Final   Candida parapsilosis NOT DETECTED NOT DETECTED Final   Candida  tropicalis NOT DETECTED NOT DETECTED Final   Cryptococcus neoformans/gattii NOT DETECTED NOT DETECTED Final   Meth resistant mecA/C and MREJ DETECTED (A) NOT DETECTED Final    Comment: CRITICAL RESULT CALLED TO, READ BACK BY AND VERIFIED WITH: JASON ROBBINS 12/06/2023 AT 9351 SRR Performed at [facility], [address] [place], [place] [address]   SARS Coronavirus 2 by RT PCR (hospital order, performed in [facility] hospital lab) *cepheid single result test* Anterior Nasal Swab     Status: None   Collection Time: 12/04/23  8:40 PM   Specimen: Anterior Nasal Swab  Result Value Ref Range Status   SARS Coronavirus 2 by RT PCR NEGATIVE NEGATIVE Final    Comment: (NOTE) SARS-CoV-2 target nucleic acids are NOT DETECTED.  The SARS-CoV-2 RNA is generally detectable in upper and lower respiratory specimens during the acute phase of infection. The lowest concentration of SARS-CoV-2 viral copies this assay can detect is 250 copies / mL. A negative result does not preclude SARS-CoV-2 infection and should not be used as the sole basis for treatment or other patient management decisions.  A negative result may occur with improper specimen collection / handling, submission of specimen other than nasopharyngeal swab, presence of viral mutation(s) within the areas targeted by this assay, and inadequate number of viral copies (<250 copies / mL). A negative result must be combined with clinical observations, patient history, and epidemiological information.  Fact Sheet for Patients:   RoadLapTop.co.za  Fact Sheet for Healthcare Providers: http://kim-miller.com/  This test is not yet approved or  cleared by the [place]  FDA and has been authorized for detection and/or diagnosis of SARS-CoV-2 by FDA under an Emergency Use Authorization (EUA).  This EUA will remain in effect (meaning this test can be used) for the duration of the COVID-19  declaration under Section 564(b)(1) of the Act, 21 U.S.C. section 360bbb-3(b)(1), unless the authorization is terminated or revoked sooner.  Performed at [facility], [address], [place], [place] [address]   Urine Culture (for pregnant, neutropenic or urologic patients or patients with an indwelling urinary catheter)     Status: Abnormal   Collection Time: 12/04/23  8:45 PM   Specimen: Urine, Clean Catch  Result Value Ref Range Status   Specimen Description   Final    URINE, CLEAN CATCH Performed at [facility], [address], [place], [place] [address]    Special Requests   Final    NONE Performed at [facility], [address], [place], [place] [address]    Culture (  A)  Final    <10,000 COLONIES/mL INSIGNIFICANT GROWTH Performed at [facility], [address], [place], [place] [address]    Report Status 12/06/2023 FINAL  Final  Respiratory (~20 pathogens) panel by PCR     Status: None   Collection Time: 12/04/23 11:30 PM   Specimen: Nasopharyngeal Swab; Respiratory  Result Value Ref Range Status   Adenovirus NOT DETECTED NOT DETECTED Final   Coronavirus 229E NOT DETECTED NOT DETECTED Final    Comment: (NOTE) The Coronavirus on the Respiratory Panel, DOES NOT test for the novel  Coronavirus (2019 nCoV)    Coronavirus HKU1 NOT DETECTED NOT DETECTED Final   Coronavirus NL63 NOT DETECTED NOT DETECTED Final   Coronavirus OC43 NOT DETECTED NOT DETECTED Final   Metapneumovirus NOT DETECTED NOT DETECTED Final   Rhinovirus / Enterovirus NOT DETECTED NOT DETECTED Final   Influenza A NOT DETECTED NOT DETECTED Final   Influenza B NOT DETECTED NOT DETECTED Final   Parainfluenza Virus 1 NOT DETECTED NOT DETECTED Final   Parainfluenza Virus 2 NOT DETECTED NOT DETECTED Final   Parainfluenza Virus 3 NOT DETECTED NOT DETECTED Final   Parainfluenza Virus 4 NOT DETECTED NOT DETECTED Final   Respiratory Syncytial Virus NOT DETECTED NOT DETECTED  Final   Bordetella pertussis NOT DETECTED NOT DETECTED Final   Bordetella Parapertussis NOT DETECTED NOT DETECTED Final   Chlamydophila pneumoniae NOT DETECTED NOT DETECTED Final   Mycoplasma pneumoniae NOT DETECTED NOT DETECTED Final    Comment: Performed at [facility], [address], [place], [place] [address]  Cath Tip Culture     Status: None (Preliminary result)   Collection Time: 12/06/23  2:45 PM   Specimen: Catheter Tip; Other  Result Value Ref Range Status   Specimen Description   Final    CATH TIP Performed at [facility], [address], [place], [place] [address]    Special Requests   Final    NONE Performed at [facility], [address] [place], [place] [address]    Culture   Final    NO GROWTH 2 DAYS Performed at [facility], [address] [place], [place] [address]    Report Status PENDING  Incomplete  MRSA Next Gen by PCR, Nasal     Status: Abnormal   Collection Time: 12/07/23  9:00 AM   Specimen: Nasal Mucosa; Nasal Swab  Result Value Ref Range Status   MRSA by PCR Next Gen DETECTED (A) NOT DETECTED Final    Comment: RESULT CALLED TO, READ BACK BY AND VERIFIED WITH: ELIZABETH HAYS 12/07/23 1041 SLM (NOTE) The GeneXpert MRSA Assay (FDA approved for NASAL specimens only), is one component of a comprehensive MRSA colonization surveillance program. It is not intended to diagnose MRSA infection nor to guide or monitor treatment for MRSA infections. Test performance is not FDA approved in patients less than 75 years old. Performed at [facility], [address] [place] [address] [place], [place] [address]   Culture, blood (Routine X 2) w Reflex to ID Panel     Status: None (Preliminary result)   Collection Time: 12/07/23 12:50 PM   Specimen: BLOOD  Result Value Ref Range Status   Specimen Description BLOOD RIGHT ANTECUBITAL  Final   Special Requests   Final    BOTTLES DRAWN AEROBIC AND ANAEROBIC Blood Culture adequate volume    Culture   Final    NO GROWTH 2 DAYS Performed at [facility], [address]  [place], [place] [address]    Report Status PENDING  Incomplete  Culture, blood (Routine X 2) w Reflex to ID Panel     Status: None (Preliminary result)   Collection Time: 12/07/23 12:50 PM   Specimen: BLOOD  Result Value Ref Range Status   Specimen Description BLOOD LEFT ANTECUBITAL  Final   Special Requests   Final    BOTTLES DRAWN AEROBIC AND ANAEROBIC Blood Culture adequate volume   Culture   Final    NO GROWTH 2 DAYS Performed at [facility], [address] [place], [place] [address]    Report Status PENDING  Incomplete    Coagulation Studies: No results for input(s): LABPROT, INR in the last 72 hours.  Urinalysis: No results for input(s): COLORURINE, LABSPEC, PHURINE, GLUCOSEU, HGBUR, BILIRUBINUR, KETONESUR, PROTEINUR, UROBILINOGEN, NITRITE, LEUKOCYTESUR in the last 72 hours.  Invalid input(s): APPERANCEUR     Imaging: ECHO TEE Result Date: 12/08/2023    TRANSESOPHOGEAL ECHO REPORT   Patient Name:   Mckenzie Lawrence Date of Exam: 12/08/2023 Medical Rec #:  [id number]      Height:       67.0 in Accession #:    [id number]     Weight:       173.7 lb Date of Birth:  1946-03-28      BSA:          1.905 m Patient Age:    78 years       BP:           131/52 mmHg Patient Gender: F              HR:           69 bpm. Exam Location:  [facility] Procedure: Transesophageal Echo, Cardiac Doppler, Color Doppler and 3D Echo            (Both Spectral and Color Flow Doppler were utilized during            procedure). Indications:     Bacteremia R78.81  History:         Patient has prior history of Echocardiogram examinations, most                  recent 12/07/2023. Risk Factors:Hypertension and Sleep Apnea.  Sonographer:     Christopher Furnace Referring Phys:  [id number] DWAYNE D CALLWOOD Diagnosing Phys: Keller Paterson PROCEDURE: After discussion of the risks and benefits of a TEE, an  informed consent was obtained from the patient. The transesophogeal probe was passed without difficulty through the esophogus of the patient. Local oropharyngeal anesthetic was provided with Cetacaine . Sedation performed by different physician. The patient was monitored while under deep sedation. Image quality was excellent. The patient's vital signs; including heart rate, blood pressure, and oxygen saturation; remained stable throughout the procedure. The patient developed no complications during the procedure.  IMPRESSIONS  1. Left ventricular ejection fraction, by estimation, is 50 to 55%. The left ventricle has low normal function.  2. Right ventricular systolic function is normal. The right ventricular size is normal.  3. No left atrial/left atrial appendage thrombus was detected.  4. The mitral valve is normal in structure. Trivial mitral valve regurgitation.  5. The aortic valve is tricuspid. There is mild thickening of the aortic valve. Aortic valve regurgitation is not visualized.  6. 3D performed of the mitral valve and demonstrates no valvular vegetation. Conclusion(s)/Recommendation(s): No evidence of vegetation/infective endocarditis on this transesophageael echocardiogram. FINDINGS  Left Ventricle: Left ventricular ejection fraction,  by estimation, is 50 to 55%. The left ventricle has low normal function. The left ventricular internal cavity size was normal in size. Right Ventricle: The right ventricular size is normal. No increase in right ventricular wall thickness. Right ventricular systolic function is normal. Left Atrium: Left atrial size was normal in size. No left atrial/left atrial appendage thrombus was detected. Right Atrium: Right atrial size was normal in size. Pericardium: There is no evidence of pericardial effusion. Mitral Valve: The mitral valve is normal in structure. Trivial mitral valve regurgitation. Tricuspid Valve: The tricuspid valve is normal in structure. Tricuspid valve  regurgitation is trivial. Aortic Valve: The aortic valve is tricuspid. There is mild thickening of the aortic valve. Aortic valve regurgitation is not visualized. Pulmonic Valve: The pulmonic valve was normal in structure. Pulmonic valve regurgitation is trivial. Aorta: The aortic root is normal in size and structure. IAS/Shunts: No atrial level shunt detected by color flow Doppler. Additional Comments: 3D was performed not requiring image post processing on an independent workstation and was normal. Keller Paterson Electronically signed by Keller Paterson Signature Date/Time: 12/08/2023/5:20:23 PM    Final    ECHOCARDIOGRAM LIMITED Result Date: 12/07/2023    ECHOCARDIOGRAM LIMITED REPORT   Patient Name:   Mckenzie Lawrence Date of Exam: 12/07/2023 Medical Rec #:  [id number]      Height:       67.0 in Accession #:    [id number]     Weight:       171.1 lb Date of Birth:  April 03, 1946      BSA:          1.892 m Patient Age:    78 years       BP:           147/56 mmHg Patient Gender: F              HR:           62 bpm. Exam Location:  [facility] Procedure: Limited Echo (Both Spectral and Color Flow Doppler were utilized            during procedure). Indications:     Bacteremia R78.81  History:         Patient has prior history of Echocardiogram examinations, most                  recent 11/29/2023.  Sonographer:     Thedora Louder RDCS, FASE Referring Phys:  [phone] DWAYNE D CALLWOOD Diagnosing Phys: Cara JONETTA Lovelace MD  Sonographer Comments: Limited 2D echo requested no spectral or color flow doppler performed on this exam. IMPRESSIONS  1. Aotic valve valve slightly thickened.  2. Mitral valve wth mild thickening.  3. No obvious vegataion seen.  4. Recommed TEE.  5. Left ventricular ejection fraction, by estimation, is 60 to 65%. The left ventricle has normal function. The left ventricle has no regional wall motion abnormalities. Left ventricular diastolic function could not be evaluated.  6. Right ventricular systolic function  is normal. The right ventricular size is normal.  7. The mitral valve is normal in structure.  8. The aortic valve is grossly normal. Aortic valve sclerosis is present, with no evidence of aortic valve stenosis. Conclusion(s)/Recommendation(s): Poor windows for evaluation of left ventricular function by transthoracic echocardiography. Would recommend an alternative means of evaluation. No evidence of valvular vegetations on this transthoracic echocardiogram. Consider a transesophageal echocardiogram to exclude infective endocarditis if clinically indicated. FINDINGS  Left Ventricle: Left ventricular ejection fraction, by estimation,  is 60 to 65%. The left ventricle has normal function. The left ventricle has no regional wall motion abnormalities. The left ventricular internal cavity size was normal in size. There is  no left ventricular hypertrophy. Left ventricular diastolic function could not be evaluated. Right Ventricle: The right ventricular size is normal. No increase in right ventricular wall thickness. Right ventricular systolic function is normal. Left Atrium: Left atrial size was normal in size. Right Atrium: Right atrial size was normal in size. Pericardium: There is no evidence of pericardial effusion. Mitral Valve: The mitral valve is normal in structure. There is mild thickening of the mitral valve leaflet(s). There is mild calcification of the mitral valve leaflet(s). Normal mobility of the mitral valve leaflets. Tricuspid Valve: The tricuspid valve is normal in structure. Aortic Valve: The aortic valve is grossly normal. Aortic valve sclerosis is present, with no evidence of aortic valve stenosis. Pulmonic Valve: The pulmonic valve was normal in structure. Aorta: The ascending aorta was not well visualized. Additional Comments: No obvious vegataion seen. Aotic valve valve slightly thickened. Mitral valve wth mild thickening. Recommed TEE.  Dwayne D Callwood MD Electronically signed by Cara JONETTA Lovelace MD Signature Date/Time: 12/07/2023/1:28:01 PM    Final       Medications:    anticoagulant sodium citrate      apixaban  5 mg Oral BID   aspirin  EC  81 mg Oral QHS   Chlorhexidine  Gluconate Cloth  6 each Topical Q0600   cloNIDine   0.2 mg Oral BID   [START ON 12/10/2023] diltiazem  120 mg Oral Daily   epoetin  alfa-epbx (RETACRIT ) injection  4,000 Units Intravenous Q T,Th,Sat-1800   insulin  aspart  0-5 Units Subcutaneous QHS   insulin  aspart  0-9 Units Subcutaneous TID WC   insulin  aspart  3 Units Subcutaneous TID WC   insulin  glargine-yfgn  10 Units Subcutaneous QHS   lactose free nutrition  237 mL Oral TID WC   lactulose   30 g Oral Once   lidocaine   10 mL Intradermal Once   losartan   100 mg Oral QHS   metoprolol  succinate  100 mg Oral QHS   multivitamin  1 tablet Oral QHS   polyethylene glycol  17 g Oral Daily   vancomycin  variable dose per unstable renal function (pharmacist dosing)   Does not apply See admin instructions   acetaminophen  **OR** acetaminophen , albuterol , anticoagulant sodium citrate, guaiFENesin -dextromethorphan , heparin , hydrALAZINE , ondansetron  **OR** ondansetron  (ZOFRAN ) IV, oxyCODONE -acetaminophen , sucralfate , zolpidem   Assessment/ Plan:  [name] is a 78 y.o.  female  with past medical history of hypertension, long-standing diabetes mellitus type 2, lower extremity edema, hyperlipidemia, tobacco abuse, prior history of CVA, COPD, obstructive sleep apnea, lumbar spinal fusion, left upper lobe lung mass who presents with volume overload and increasing shortness of breath.   1.  Acute kidney injury/chronic kidney disease stage IV/diabetes mellitus type 2 with chronic kidney disease/proteinuria/MRSA bacteremia.  eGFR down to 11.  Baseline creatinine 2.7 in February 2025.  Creatinine increased to 3.6-4 in June 2025. - PermCath originally placed  (12/01/2023) and removed 12/06/2023.  Negative TEE 12/08/2023. - Case discussed with infectious diseases  this a.m. 12/09/2023.  They recommended replacement of PermCath tomorrow.  We will plan for next dialysis session on 12/10/2023. -Outpatient dialysis seat at [facility] has been secured, first treatment was scheduled for 12/09/2023.  Patient to arrive at 11:30 AM.  Lab Results  Component Value Date   CREATININE 3.78 (H) 12/09/2023   CREATININE 2.65 (H)  12/07/2023   CREATININE 3.03 (H) 12/06/2023     Intake/Output Summary (Last 24 hours) at 12/09/2023 0845 Last data filed at 12/09/2023 0300 Gross per 24 hour  Intake 360 ml  Output 700 ml  Net -340 ml       2.  Hyponatremia.  Serum sodium 132 at last check.  3.  Hyperkalemia Potassium within normal range at 3.8.   4.  HTN - 2D echo from 11/29/2023 shows LVEF 55 to 60%, grade 2 diastolic dysfunction, trivial mitral regurgitation Continue clonidine , losartan , and metoprolol .   5.  Anemia of chronic kidney disease.  Hemoglobin improved slightly to 7.3.  Patient has  left upper lobe mass concerning for malignancy.  Discussed with Dr. Lamon.  Okay to use ESA.  Consider transfusion for HgB<7     LOS: 11 Tiny Chaudhary 6/24/20258:45 AM

## 2023-12-09 NOTE — Progress Notes (Signed)
 Progress Note    12/09/2023 2:52 PM 1 Day Post-Op  Subjective: Mckenzie Lawrence is a 78 year old female who presented to [facility] emergency department in renal failure.  Patient needed long-term dialysis started for her end-stage renal disease.  A permacatheter was placed by Dr. Selinda Gu on 12/01/2023.  This past weekend on 12/06/2023 patient was deemed to be bacteremic.  A source of her bacteremia could have possibly been her dialysis permacatheter.  Dr. Curry Beat covering for vascular surgery service remove the permacatheter that had been placed.  Patient now needs new access for her end-stage renal disease for hemodialysis.  Vascular surgery was contacted again for placement of a dialysis permacatheter.   Vitals:   12/09/23 1133 12/09/23 1224  BP: (!) 135/47 (!) 135/47  Pulse: 75   Resp: 20   Temp: 100.2 F (37.9 C)   SpO2: 98%    Physical Exam: Cardiac:  RRR, normal S1 and S2, no rubs clicks gallops or murmurs noted. Lungs: Lungs are clear throughout on auscultation this morning.  No rales rhonchi or wheezing. Incisions: Right chest incision with tunnel catheter placement.  No signs or symptoms of hematoma seroma or infection. Extremities: All extremities are warm to touch with palpable pulses. Abdomen: Positive bowel sounds throughout on auscultation.  Soft, nontender nondistended. Neurologic: Alert and oriented x 3, answers all questions follows commands appropriately.  CBC    Component Value Date/Time   WBC 13.8 (H) 12/09/2023 0509   RBC 2.45 (L) 12/09/2023 0509   HGB 7.3 (L) 12/09/2023 0509   HGB 13.6 12/01/2011 1656   HCT 22.8 (L) 12/09/2023 0509   HCT 40.9 12/01/2011 1656   PLT 209 12/09/2023 0509   PLT 299 12/01/2011 1656   MCV 93.1 12/09/2023 0509   MCV 95 12/01/2011 1656   MCH 29.8 12/09/2023 0509   MCHC 32.0 12/09/2023 0509   RDW 14.2 12/09/2023 0509   RDW 13.1 12/01/2011 1656   LYMPHSABS 0.6 (L) 11/28/2023 0246   MONOABS 0.7 11/28/2023 0246   EOSABS 0.0  11/28/2023 0246   BASOSABS 0.0 11/28/2023 0246    BMET    Component Value Date/Time   NA 132 (L) 12/09/2023 0509   NA 137 12/01/2011 1656   K 3.8 12/09/2023 0509   K 4.3 12/01/2011 1656   CL 95 (L) 12/09/2023 0509   CL 104 12/01/2011 1656   CO2 26 12/09/2023 0509   CO2 23 12/01/2011 1656   GLUCOSE 218 (H) 12/09/2023 0509   GLUCOSE 116 (H) 12/01/2011 1656   BUN 41 (H) 12/09/2023 0509   BUN 12 12/01/2011 1656   CREATININE 3.78 (H) 12/09/2023 0509   CREATININE 1.15 12/01/2011 1656   CALCIUM  8.1 (L) 12/09/2023 0509   CALCIUM  8.5 12/01/2011 1656   GFRNONAA 12 (L) 12/09/2023 0509   GFRNONAA 50 (L) 12/01/2011 1656   GFRAA 43 (L) 05/06/2018 1436   GFRAA 58 (L) 12/01/2011 1656    INR    Component Value Date/Time   INR 1.4 (H) 07/19/2023 2040     Intake/Output Summary (Last 24 hours) at 12/09/2023 1452 Last data filed at 12/09/2023 0845 Gross per 24 hour  Intake 200 ml  Output 300 ml  Net -100 ml     Assessment/Plan:  78 y.o. female is s/p dialysis permacatheter placement did not have dialysis permacatheter removed due to bacteremia.  1 Day Post-Op   PLAN  Vascular surgery plans on taking the patient to the vascular lab on 12/10/2023 for dialysis permacatheter placement for long-term hemodialysis.  I had a long detailed discussion at the bedside with the patient regarding the procedure, benefits, risk, complications.  She verbalized understanding wishes to proceed.  She does not have any other questions today as this is the second permacatheter placement.  Patient will be made n.p.o. after midnight tonight for procedure tomorrow.  Medical team was made aware to hold tomorrow morning's Eliquis dose.   DVT prophylaxis: Eliquis 5 mg twice daily, aspirin  81 mg daily.   [name] 12/09/2023 2:52 PM

## 2023-12-09 NOTE — Plan of Care (Signed)
 Problem: Education: Goal: Ability to describe self-care measures that may prevent or decrease complications (Diabetes Survival Skills Education) will improve Outcome: Progressing Goal: Individualized Educational Video(s) Outcome: Progressing   Problem: Coping: Goal: Ability to adjust to condition or change in health will improve Outcome: Progressing   Problem: Fluid Volume: Goal: Ability to maintain a balanced intake and output will improve Outcome: Progressing   Problem: Health Behavior/Discharge Planning: Goal: Ability to identify and utilize available resources and services will improve Outcome: Progressing Goal: Ability to manage health-related needs will improve Outcome: Progressing   Problem: Metabolic: Goal: Ability to maintain appropriate glucose levels will improve Outcome: Progressing   Problem: Nutritional: Goal: Maintenance of adequate nutrition will improve Outcome: Progressing Goal: Progress toward achieving an optimal weight will improve Outcome: Progressing   Problem: Skin Integrity: Goal: Risk for impaired skin integrity will decrease Outcome: Progressing   Problem: Tissue Perfusion: Goal: Adequacy of tissue perfusion will improve Outcome: Progressing   Problem: Education: Goal: Knowledge of General Education information will improve Description: Including pain rating scale, medication(s)/side effects and non-pharmacologic comfort measures Outcome: Progressing   Problem: Health Behavior/Discharge Planning: Goal: Ability to manage health-related needs will improve Outcome: Progressing   Problem: Clinical Measurements: Goal: Ability to maintain clinical measurements within normal limits will improve Outcome: Progressing Goal: Will remain free from infection Outcome: Progressing Goal: Diagnostic test results will improve Outcome: Progressing Goal: Respiratory complications will improve Outcome: Progressing Goal: Cardiovascular complication will  be avoided Outcome: Progressing   Problem: Activity: Goal: Risk for activity intolerance will decrease Outcome: Progressing   Problem: Nutrition: Goal: Adequate nutrition will be maintained Outcome: Progressing   Problem: Coping: Goal: Level of anxiety will decrease Outcome: Progressing   Problem: Elimination: Goal: Will not experience complications related to bowel motility Outcome: Progressing Goal: Will not experience complications related to urinary retention Outcome: Progressing   Problem: Pain Managment: Goal: General experience of comfort will improve and/or be controlled Outcome: Progressing   Problem: Safety: Goal: Ability to remain free from injury will improve Outcome: Progressing   Problem: Skin Integrity: Goal: Risk for impaired skin integrity will decrease Outcome: Progressing   Problem: Education: Goal: Ability to demonstrate management of disease process will improve Outcome: Progressing Goal: Ability to verbalize understanding of medication therapies will improve Outcome: Progressing Goal: Individualized Educational Video(s) Outcome: Progressing   Problem: Activity: Goal: Capacity to carry out activities will improve Outcome: Progressing   Problem: Cardiac: Goal: Ability to achieve and maintain adequate cardiopulmonary perfusion will improve Outcome: Progressing   Problem: Education: Goal: Knowledge of disease or condition will improve Outcome: Progressing Goal: Knowledge of the prescribed therapeutic regimen will improve Outcome: Progressing Goal: Individualized Educational Video(s) Outcome: Progressing   Problem: Activity: Goal: Ability to tolerate increased activity will improve Outcome: Progressing Goal: Will verbalize the importance of balancing activity with adequate rest periods Outcome: Progressing   Problem: Respiratory: Goal: Ability to maintain a clear airway will improve Outcome: Progressing Goal: Levels of oxygenation  will improve Outcome: Progressing Goal: Ability to maintain adequate ventilation will improve Outcome: Progressing   Problem: Education: Goal: Knowledge of disease and its progression will improve Outcome: Progressing Goal: Individualized Educational Video(s) Outcome: Progressing   Problem: Fluid Volume: Goal: Compliance with measures to maintain balanced fluid volume will improve Outcome: Progressing   Problem: Health Behavior/Discharge Planning: Goal: Ability to manage health-related needs will improve Outcome: Progressing   Problem: Nutritional: Goal: Ability to make healthy dietary choices will improve Outcome: Progressing   Problem: Clinical Measurements: Goal: Complications related to  the disease process, condition or treatment will be avoided or minimized Outcome: Progressing

## 2023-12-09 NOTE — Progress Notes (Signed)
 Flushed IV and pain with flushing, slight swelling at/near site, pink and tender. IV removed and per daughter and patient's request will not place another IV. Daughter states that patient will be getting a perm catheter and IV antibiotics can be infused via perm catheter. Discussed with Raquel, RPH that patient's IV site is tender, slightly swollen and pink and hurt with flushing therefore it was removed. RN discussed with pharmacist that the last infusion received through the IV was vancomycin  and then carrier NS afterwards. Last dose of vancomycin  was given yesterday around 1600. Raquel, RPH recommended warm compress. This was discussed with patient's daughter as well and she agreed. RN will apply warm compress.

## 2023-12-09 NOTE — Progress Notes (Signed)
 INFECTIOUS DISEASE PROGRESS NOTE Date of Admission:  11/28/2023     ID: GLENDOLA FRIEDHOFF is a 77 y.o. female with  MRSA bacteremia Principal Problem:   Hypertensive urgency/emergency Active Problems:   Hyponatremia   CAD (coronary artery disease)   COPD with acute exacerbation (HCC)   Acute renal failure superimposed on stage 4 chronic kidney disease (HCC)   Chest pain   Hypertensive emergency   Acute dyspnea   Encounter for dialysis catheter care [facility])   Paroxysmal atrial fibrillation (HCC)   Subjective: Tm 100.2, wbc 13, FU bcx ngtd. Up and sitting in chair. Denies fevers, back pain, joint pain   ROS  Eleven systems are reviewed and negative except per hpi  Medications:  Antibiotics Given (last 72 hours)     Date/Time Action Medication Dose Rate   12/08/23 1610 New Bag/Given   vancomycin  (VANCOREADY) IVPB 1500 mg/300 mL 1,500 mg 150 mL/hr       apixaban  5 mg Oral BID   aspirin  EC  81 mg Oral QHS   Chlorhexidine  Gluconate Cloth  6 each Topical Q0600   cloNIDine   0.2 mg Oral BID   diltiazem  120 mg Oral Daily   epoetin  alfa-epbx (RETACRIT ) injection  4,000 Units Intravenous Q T,Th,Sat-1800   insulin  aspart  0-5 Units Subcutaneous QHS   insulin  aspart  0-9 Units Subcutaneous TID WC   insulin  aspart  3 Units Subcutaneous TID WC   insulin  glargine-yfgn  10 Units Subcutaneous QHS   lactose free nutrition  237 mL Oral TID WC   lactulose   30 g Oral Once   lidocaine   10 mL Intradermal Once   losartan   100 mg Oral QHS   metoprolol  succinate  100 mg Oral QHS   multivitamin  1 tablet Oral QHS   polyethylene glycol  17 g Oral Daily   vancomycin  variable dose per unstable renal function (pharmacist dosing)   Does not apply See admin instructions    Objective: Vital signs in last 24 hours: Temp:  [98.1 F (36.7 C)-100.2 F (37.9 C)] 100.2 F (37.9 C) (06/24 1133) Pulse Rate:  [60-78] 75 (06/24 1133) Resp:  [18-31] 20 (06/24 1133) BP: (108-145)/(42-60) 135/47 (06/24  1224) SpO2:  [92 %-100 %] 98 % (06/24 1133) Weight:  [79.1 kg] 79.1 kg (06/24 0500) Constitutional:  frail, alert and interactive HENT: [place]/AT, PERRLA, no scleral icterus Mouth/Throat: Oropharynx is clear and moist. No oropharyngeal exudate.  Cardiovascular: Normal rate, regular rhythm and normal heart sounds.  Pulmonary/Chest: Effort normal and breath sounds normal. No respiratory distress.  has no wheezes.  R chest wall prior HD cath site covered. Non tender Neck = supple, no nuchal rigidity Abdominal: Soft. Bowel sounds are normal.  exhibits no distension. There is no tenderness.  Lymphadenopathy: no cervical adenopathy. No axillary adenopathy Neurological: alert and oriented to person, place, and time.  Skin: Skin is warm and dry. No rash noted. No erythema.  Psychiatric: a normal mood and affect.  behavior is normal.      Lab Results Recent Labs    12/07/23 1219 12/09/23 0509  WBC 13.2* 13.8*  HGB 7.1* 7.3*  HCT 21.6* 22.8*  NA 132* 132*  K 3.5 3.8  CL 94* 95*  CO2 27 26  BUN 28* 41*  CREATININE 2.65* 3.78*    Microbiology: Results for orders placed or performed during the hospital encounter of 11/28/23  Culture, blood (Routine X 2) w Reflex to ID Panel     Status: Abnormal (Preliminary result)  Collection Time: 12/04/23  6:34 PM   Specimen: BLOOD  Result Value Ref Range Status   Specimen Description   Final    BLOOD BLOOD RIGHT ARM Performed at [facility], [address] [place], [place] [address]    Special Requests   Final    BOTTLES DRAWN AEROBIC AND ANAEROBIC Blood Culture adequate volume Performed at [facility], [address] [place], [place] [address]    Culture  Setup Time   Final    Organism ID to follow IN BOTH AEROBIC AND ANAEROBIC BOTTLES GRAM POSITIVE COCCI CRITICAL RESULT CALLED TO, READ BACK BY AND VERIFIED WITH: SELINDA SIMPERS 12/06/2023 AT 9351 SRR Performed at [facility], [address] [place],  [place] [address]    Culture (A)  Final    METHICILLIN RESISTANT STAPHYLOCOCCUS AUREUS Sent to [facility] for further susceptibility testing. Performed at [facility], [address] [place], [place] [address]    Report Status PENDING  Incomplete   Organism ID, Bacteria METHICILLIN RESISTANT STAPHYLOCOCCUS AUREUS  Final      Susceptibility   Methicillin resistant staphylococcus aureus - MIC*    CIPROFLOXACIN  >=8 RESISTANT Resistant     ERYTHROMYCIN  >=8 RESISTANT Resistant     GENTAMICIN  <=0.5 SENSITIVE Sensitive     OXACILLIN >=4 RESISTANT Resistant     TETRACYCLINE <=1 SENSITIVE Sensitive     VANCOMYCIN  2 SENSITIVE Sensitive     TRIMETH/SULFA <=10 SENSITIVE Sensitive     CLINDAMYCIN <=0.25 SENSITIVE Sensitive     RIFAMPIN <=0.5 SENSITIVE Sensitive     Inducible Clindamycin NEGATIVE Sensitive     LINEZOLID  2 SENSITIVE Sensitive     * METHICILLIN RESISTANT STAPHYLOCOCCUS AUREUS  Culture, blood (Routine X 2) w Reflex to ID Panel     Status: Abnormal   Collection Time: 12/04/23  6:34 PM   Specimen: BLOOD  Result Value Ref Range Status   Specimen Description   Final    BLOOD BLOOD LEFT ARM Performed at [facility], [address] [place], [place] [address]    Special Requests   Final    BOTTLES DRAWN AEROBIC AND ANAEROBIC Blood Culture adequate volume Performed at [facility], [address] [place], [place] [address]    Culture  Setup Time   Final    GRAM POSITIVE COCCI IN BOTH AEROBIC AND ANAEROBIC BOTTLES CRITICAL VALUE NOTED.  VALUE IS CONSISTENT WITH PREVIOUSLY REPORTED AND CALLED VALUE. Performed at [facility], [address] [place], [place] [address]    Culture (A)  Final    STAPHYLOCOCCUS AUREUS SUSCEPTIBILITIES PERFORMED ON PREVIOUS CULTURE WITHIN THE LAST 5 DAYS. Performed at [facility], [address] [place], [place] [address]    Report Status 12/08/2023 FINAL  Final  Blood Culture ID Panel (Reflexed)     Status:  Abnormal   Collection Time: 12/04/23  6:34 PM  Result Value Ref Range Status   Enterococcus faecalis NOT DETECTED NOT DETECTED Final   Enterococcus Faecium NOT DETECTED NOT DETECTED Final   Listeria monocytogenes NOT DETECTED NOT DETECTED Final   Staphylococcus species DETECTED (A) NOT DETECTED Final    Comment: CRITICAL RESULT CALLED TO, READ BACK BY AND VERIFIED WITH: JASON ROBBINS 12/06/2023 AT 0648 SRR    Staphylococcus aureus (BCID) DETECTED (A) NOT DETECTED Final    Comment: Methicillin (oxacillin)-resistant Staphylococcus aureus (MRSA). MRSA is predictably resistant to beta-lactam antibiotics (except ceftaroline). Preferred therapy is vancomycin  unless clinically contraindicated. Patient requires contact precautions if  hospitalized. CRITICAL RESULT CALLED TO, READ BACK BY AND VERIFIED WITH: JASON ROBBINS 12/06/2023 AT 0648 SRR    Staphylococcus epidermidis NOT DETECTED NOT DETECTED Final   Staphylococcus lugdunensis NOT DETECTED NOT DETECTED Final   Streptococcus species NOT DETECTED NOT DETECTED Final   Streptococcus agalactiae NOT DETECTED NOT DETECTED Final   Streptococcus pneumoniae NOT DETECTED NOT DETECTED Final   Streptococcus pyogenes NOT DETECTED NOT DETECTED Final   A.calcoaceticus-baumannii NOT DETECTED NOT DETECTED Final   Bacteroides fragilis NOT DETECTED NOT DETECTED Final   Enterobacterales NOT DETECTED NOT DETECTED Final   Enterobacter cloacae complex NOT DETECTED NOT DETECTED Final   Escherichia coli NOT DETECTED NOT DETECTED Final   Klebsiella aerogenes NOT DETECTED NOT DETECTED Final   Klebsiella oxytoca NOT DETECTED NOT DETECTED Final   Klebsiella pneumoniae NOT DETECTED NOT DETECTED Final   Proteus species NOT DETECTED NOT DETECTED Final   Salmonella species NOT DETECTED NOT DETECTED Final   Serratia marcescens NOT DETECTED NOT DETECTED Final   Haemophilus influenzae NOT DETECTED NOT DETECTED Final   Neisseria meningitidis NOT DETECTED NOT DETECTED Final    Pseudomonas aeruginosa NOT DETECTED NOT DETECTED Final   Stenotrophomonas maltophilia NOT DETECTED NOT DETECTED Final   Candida albicans NOT DETECTED NOT DETECTED Final   Candida auris NOT DETECTED NOT DETECTED Final   Candida glabrata NOT DETECTED NOT DETECTED Final   Candida krusei NOT DETECTED NOT DETECTED Final   Candida parapsilosis NOT DETECTED NOT DETECTED Final   Candida tropicalis NOT DETECTED NOT DETECTED Final   Cryptococcus neoformans/gattii NOT DETECTED NOT DETECTED Final   Meth resistant mecA/C and MREJ DETECTED (A) NOT DETECTED Final    Comment: CRITICAL RESULT CALLED TO, READ BACK BY AND VERIFIED WITH: JASON ROBBINS 12/06/2023 AT 9351 SRR Performed at [facility], [address] [place], [place] [address]   SARS Coronavirus 2 by RT PCR (hospital order, performed in [facility] hospital lab) *cepheid single result test* Anterior Nasal Swab     Status: None   Collection Time: 12/04/23  8:40 PM   Specimen: Anterior Nasal Swab  Result Value Ref Range Status   SARS Coronavirus 2 by RT PCR NEGATIVE NEGATIVE Final    Comment: (NOTE) SARS-CoV-2 target nucleic acids are NOT DETECTED.  The SARS-CoV-2 RNA is generally detectable in upper and lower respiratory specimens during the acute phase of infection. The lowest concentration of SARS-CoV-2 viral copies this assay can detect is 250 copies / mL. A negative result does not preclude SARS-CoV-2 infection and should not be used as the sole basis for treatment or other patient management decisions.  A negative result may occur with improper specimen collection / handling, submission of specimen other than nasopharyngeal swab, presence of viral mutation(s) within the areas targeted by this assay, and inadequate number of viral copies (<250 copies / mL). A negative result must be combined with clinical observations, patient history, and epidemiological information.  Fact Sheet for Patients:    RoadLapTop.co.za  Fact Sheet for Healthcare Providers: http://kim-miller.com/  This test is not yet approved or  cleared by the [place]  FDA and has been authorized for detection and/or diagnosis of SARS-CoV-2 by FDA under an Emergency Use Authorization (EUA).  This EUA will remain in effect (meaning this test can be used) for the duration of the COVID-19 declaration under Section 564(b)(1) of the Act, 21 U.S.C. section 360bbb-3(b)(1), unless the authorization is terminated or revoked sooner.  Performed at [facility], [address], [place], [place] [address]  Urine Culture (for pregnant, neutropenic or urologic patients or patients with an indwelling urinary catheter)     Status: Abnormal   Collection Time: 12/04/23  8:45 PM   Specimen: Urine, Clean Catch  Result Value Ref Range Status   Specimen Description   Final    URINE, CLEAN CATCH Performed at [facility], [address] [place], [place] [address]    Special Requests   Final    NONE Performed at [facility], [address] [place], [place] [address]    Culture (A)  Final    <10,000 COLONIES/mL INSIGNIFICANT GROWTH Performed at [facility], [address] [place], [place] [address]    Report Status 12/06/2023 FINAL  Final  Respiratory (~20 pathogens) panel by PCR     Status: None   Collection Time: 12/04/23 11:30 PM   Specimen: Nasopharyngeal Swab; Respiratory  Result Value Ref Range Status   Adenovirus NOT DETECTED NOT DETECTED Final   Coronavirus 229E NOT DETECTED NOT DETECTED Final    Comment: (NOTE) The Coronavirus on the Respiratory Panel, DOES NOT test for the novel  Coronavirus (2019 nCoV)    Coronavirus HKU1 NOT DETECTED NOT DETECTED Final   Coronavirus NL63 NOT DETECTED NOT DETECTED Final   Coronavirus OC43 NOT DETECTED NOT DETECTED Final   Metapneumovirus NOT DETECTED NOT DETECTED Final   Rhinovirus /  Enterovirus NOT DETECTED NOT DETECTED Final   Influenza A NOT DETECTED NOT DETECTED Final   Influenza B NOT DETECTED NOT DETECTED Final   Parainfluenza Virus 1 NOT DETECTED NOT DETECTED Final   Parainfluenza Virus 2 NOT DETECTED NOT DETECTED Final   Parainfluenza Virus 3 NOT DETECTED NOT DETECTED Final   Parainfluenza Virus 4 NOT DETECTED NOT DETECTED Final   Respiratory Syncytial Virus NOT DETECTED NOT DETECTED Final   Bordetella pertussis NOT DETECTED NOT DETECTED Final   Bordetella Parapertussis NOT DETECTED NOT DETECTED Final   Chlamydophila pneumoniae NOT DETECTED NOT DETECTED Final   Mycoplasma pneumoniae NOT DETECTED NOT DETECTED Final    Comment: Performed at [facility], [address] [place], [place] [address]  Cath Tip Culture     Status: None (Preliminary result)   Collection Time: 12/06/23  2:45 PM   Specimen: Catheter Tip; Other  Result Value Ref Range Status   Specimen Description   Final    CATH TIP Performed at [facility], [address], [place], [place] [address]    Special Requests   Final    NONE Performed at [facility], [address], [place], [place] [address]    Culture   Final    NO GROWTH 2 DAYS Performed at [facility], [address], [place], [place] [address]    Report Status PENDING  Incomplete  MRSA Next Gen by PCR, Nasal     Status: Abnormal   Collection Time: 12/07/23  9:00 AM   Specimen: Nasal Mucosa; Nasal Swab  Result Value Ref Range Status   MRSA by PCR Next Gen DETECTED (A) NOT DETECTED Final    Comment: RESULT CALLED TO, READ BACK BY AND VERIFIED WITH: ELIZABETH HAYS 12/07/23 1041 SLM (NOTE) The GeneXpert MRSA Assay (FDA approved for NASAL specimens only), is one component of a comprehensive MRSA colonization surveillance program. It is not intended to diagnose MRSA infection nor to guide or monitor treatment for MRSA infections. Test performance is not FDA approved in patients less than 36  years old. Performed at [facility],  [address], [place], [place] [address]   Culture, blood (Routine X 2) w Reflex to ID Panel     Status: None (Preliminary result)   Collection Time: 12/07/23 12:50 PM   Specimen: BLOOD  Result Value Ref Range Status   Specimen Description BLOOD RIGHT ANTECUBITAL  Final   Special Requests   Final    BOTTLES DRAWN AEROBIC AND ANAEROBIC Blood Culture adequate volume   Culture   Final    NO GROWTH 2 DAYS Performed at [facility], [address], [place], [place] [address]    Report Status PENDING  Incomplete  Culture, blood (Routine X 2) w Reflex to ID Panel     Status: None (Preliminary result)   Collection Time: 12/07/23 12:50 PM   Specimen: BLOOD  Result Value Ref Range Status   Specimen Description BLOOD LEFT ANTECUBITAL  Final   Special Requests   Final    BOTTLES DRAWN AEROBIC AND ANAEROBIC Blood Culture adequate volume   Culture   Final    NO GROWTH 2 DAYS Performed at [facility], [address], [place], [place] [address]    Report Status PENDING  Incomplete    Studies/Results: ECHO TEE Result Date: 12/08/2023    TRANSESOPHOGEAL ECHO REPORT   Patient Name:   NADJA LINA Date of Exam: 12/08/2023 Medical Rec #:  [id number]      Height:       67.0 in Accession #:    [id number]     Weight:       173.7 lb Date of Birth:  01-Nov-1945      BSA:          1.905 m Patient Age:    78 years       BP:           131/52 mmHg Patient Gender: F              HR:           69 bpm. Exam Location:  [facility] Procedure: Transesophageal Echo, Cardiac Doppler, Color Doppler and 3D Echo            (Both Spectral and Color Flow Doppler were utilized during            procedure). Indications:     Bacteremia R78.81  History:         Patient has prior history of Echocardiogram examinations, most                  recent 12/07/2023. Risk Factors:Hypertension and Sleep Apnea.  Sonographer:     Christopher Furnace Referring Phys:  [id number] DWAYNE D  CALLWOOD Diagnosing Phys: Keller Paterson PROCEDURE: After discussion of the risks and benefits of a TEE, an informed consent was obtained from the patient. The transesophogeal probe was passed without difficulty through the esophogus of the patient. Local oropharyngeal anesthetic was provided with Cetacaine . Sedation performed by different physician. The patient was monitored while under deep sedation. Image quality was excellent. The patient's vital signs; including heart rate, blood pressure, and oxygen saturation; remained stable throughout the procedure. The patient developed no complications during the procedure.  IMPRESSIONS  1. Left ventricular ejection fraction, by estimation, is 50 to 55%. The left ventricle has low normal function.  2. Right ventricular systolic function is normal. The right ventricular size is normal.  3. No left atrial/left atrial appendage thrombus was detected.  4. The mitral valve is normal in structure. Trivial mitral valve regurgitation.  5. The aortic valve is tricuspid. There is mild thickening of the aortic valve. Aortic valve regurgitation is not visualized.  6. 3D performed of the mitral valve and demonstrates no valvular vegetation. Conclusion(s)/Recommendation(s): No evidence of vegetation/infective endocarditis on this transesophageael echocardiogram. FINDINGS  Left Ventricle: Left ventricular ejection fraction, by estimation, is 50 to 55%. The left ventricle has low normal function. The left ventricular internal cavity size was normal in size. Right Ventricle: The right ventricular size is normal. No increase in right ventricular wall thickness. Right ventricular systolic function is normal. Left Atrium: Left atrial size was normal in size. No left atrial/left atrial appendage thrombus was detected. Right Atrium: Right atrial size was normal in size. Pericardium: There is no evidence of pericardial effusion. Mitral Valve: The mitral valve is normal in structure. Trivial  mitral valve regurgitation. Tricuspid Valve: The tricuspid valve is normal in structure. Tricuspid valve regurgitation is trivial. Aortic Valve: The aortic valve is tricuspid. There is mild thickening of the aortic valve. Aortic valve regurgitation is not visualized. Pulmonic Valve: The pulmonic valve was normal in structure. Pulmonic valve regurgitation is trivial. Aorta: The aortic root is normal in size and structure. IAS/Shunts: No atrial level shunt detected by color flow Doppler. Additional Comments: 3D was performed not requiring image post processing on an independent workstation and was normal. Keller Paterson Electronically signed by Keller Paterson Signature Date/Time: 12/08/2023/5:20:23 PM    Final     Assessment/Plan: 78 you female admitted with hypertensive urgency and acute on chronic kidney failure requiring placement of a dialysis catheter.  Several days into admission she had a fever and was found to have MRSA bacteremia.  Permacath was removed June 24 and she is due for repeat dialysis June 24. Clinically improving and fu cx neg from 6/21.  Her history is complicated because she had an MRSA bacteremia back in February of unknown source.  She has chronic back issues and has had lumbar fusion.  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  Prior to this admission she had a recent MRI done at Dr. Mavis office that per report did not show any evidence of infection.   TEE negative.    Recommendations HD cath associated MRSA bacteremia-  Would plan on min 4 week treatment at this point. May need longer if concern for back infection Unclear if bacteremia could have seeded her spine but would be too early to image again. Would delay HD cath placement until 6/25. Checking dapto sensis Check esr crp for baseline  Thank you very much for the consult. Will follow with you.  Alm SHAUNNA Needle  12/09/2023, 1:25 PM

## 2023-12-09 NOTE — Inpatient Diabetes Management (Signed)
 Inpatient Diabetes Program Recommendations  AACE/ADA: New Consensus Statement on Inpatient Glycemic Control (2015)  Target Ranges:  Prepandial:   less than 140 mg/dL      Peak postprandial:   less than 180 mg/dL (1-2 hours)      Critically ill patients:  140 - 180 mg/dL    Latest Reference Range & Units 12/07/23 07:42 12/07/23 12:03 12/07/23 16:41 12/07/23 22:23  Glucose-Capillary 70 - 99 mg/dL 688 (H)  7 units Novolog   243 (H)  3 units Novolog   317 (H)  7 units Novolog   185 (H)   Semglee  Dose HELD  (H): Data is abnormally high  Latest Reference Range & Units 12/08/23 07:45 12/08/23 11:18 12/08/23 16:34 12/08/23 21:54  Glucose-Capillary 70 - 99 mg/dL 813 (H)  Novolog  HELD 204 (H)  Novolog  HELD 187 (H)  2 units Novolog   255 (H)  3 units Novolog   Semglee  Dose HELD  (H): Data is abnormally high    Home DM Meds: Semglee  10 units BID   Current Orders: Semglee  10 units at HS     Novolog  3 units TID with meals     Novolog  Sensitive Correction Scale/ SSI (0-9 units) TID AC + HS    MD- Please note that pt has NOT received any Semglee  insulin  since PM 06/21  Semglee  dose HELD 06/22 and 06/23     --Will follow patient during hospitalization--  Adina Rudolpho Arrow RN, MSN, CDCES Diabetes Coordinator Inpatient Glycemic Control Team Team Pager: [phone] (8a-5p)

## 2023-12-09 NOTE — [place] FL2 (Signed)
 [place]  MEDICAID FL2 LEVEL OF CARE FORM     IDENTIFICATION  Patient Name: Mckenzie Lawrence Birthdate: 17-Nov-1945 Sex: female Admission Date (Current Location): 11/28/2023  [facility] and [place] Number:  Chiropodist and Address:  [facility], [address], [place], [place] [address]      Provider Number: [id number]  Attending Physician Name and Address:  Kandis Devaughn Sayres, MD  Relative Name and Phone Number:  Daughter([name] Leora ) [phone]    Current Level of Care: Hospital Recommended Level of Care: Skilled Nursing Facility Prior Approval Number:    Date Approved/Denied:   PASRR Number: [id number]  Discharge Plan: SNF    Current Diagnoses: Patient Active Problem List   Diagnosis Date Noted   Paroxysmal atrial fibrillation (HCC) 12/08/2023   Encounter for dialysis catheter care (HCC) 12/02/2023   Chest pain 11/28/2023   Hypertensive emergency 11/28/2023   Acute dyspnea 11/28/2023   MRSA bacteremia 07/23/2023   Pneumonia due to infectious organism 07/23/2023   AKI (acute kidney injury) (HCC) 07/23/2023   Sepsis due to pneumonia (HCC) 07/20/2023   Multifocal pneumonia 07/19/2023   Acute renal failure superimposed on stage 4 chronic kidney disease (HCC) 07/19/2023   SIADH (syndrome of inappropriate ADH production) (HCC) 07/19/2023   SIRS (systemic inflammatory response syndrome) (HCC) 07/19/2023   Hypertensive urgency/emergency 07/19/2023   COPD with acute exacerbation (HCC)    Lumbar surgical wound fluid collection 08/13/2022   Type 2 diabetes mellitus with other specified complication (HCC) 08/09/2022   Stage 4 chronic kidney disease (HCC) 08/09/2022   Wound infection complicating hardware (HCC) 08/08/2022   S/P lumbar spinal fusion 07/08/2022   Recurrent displacement of lumbar disc 05/20/2022   Acute cystitis without hematuria    Acute pancreatitis 11/10/2021   Acute lower UTI 11/10/2021   Diabetes mellitus with renal  manifestations, controlled (HCC) 11/10/2021   CKD (chronic kidney disease) stage 4, GFR 15-29 ml/min (HCC) 11/10/2021   Lumbar spinal stenosis 10/01/2021   Esophageal spasm 08/10/2021   Esophageal dysphagia 06/28/2021   Heart palpitations 10/12/2020   Situational anxiety 07/08/2019   Chronic kidney disease, stage 3 unspecified (HCC) 03/01/2019   Edema of lower extremity 03/01/2019   Proteinuria 03/01/2019   Encounter for orthopedic follow-up care 11/04/2018   CAD (coronary artery disease) 10/26/2018   Pain in right hand 07/22/2018   Degeneration of lumbar intervertebral disc 02/06/2018   Scoliosis deformity of spine 02/06/2018   Spondylolisthesis, grade 1 02/06/2018   Low back pain 02/04/2018   Spinal stenosis of lumbar region 02/04/2018   Trigger finger 01/26/2018   CVA (cerebral vascular accident) (HCC) 03/25/2016   OSA (obstructive sleep apnea) 02/15/2016   Hyponatremia 09/25/2015   Vitamin B12 deficiency 09/12/2015   Lacunar stroke of left subthalamic region (HCC) 09/12/2015   Diabetic polyneuropathy associated with diabetes mellitus due to underlying condition (HCC) 09/12/2015   Stroke (HCC) 03/15/2015   Accelerated hypertension 03/15/2015   Vertigo 09/24/2013   Female stress incontinence 02/10/2013   Incomplete emptying of bladder 02/10/2013   Increased frequency of urination 02/10/2013   Urge incontinence 02/10/2013   Syncope 12/04/2011   Tobacco abuse 12/04/2011   Elevated cholesterol    Cataracts, bilateral    Hypertension    Broken foot    PCOS (polycystic ovarian syndrome)    Type 2 diabetes mellitus with hyperlipidemia (HCC)    Osteopenia     Orientation RESPIRATION BLADDER Height & Weight     Self, Time, Situation, Place  O2 (Nasal Cannula  4 L) Continent Weight: 174 lb 6.1 oz (79.1 kg) Height:  5' 7 (170.2 cm)  BEHAVIORAL SYMPTOMS/MOOD NEUROLOGICAL BOWEL NUTRITION STATUS   (None)  (None) Continent Diet (Regular)  AMBULATORY STATUS COMMUNICATION OF NEEDS  Skin   Limited Assist Verbally Bruising                       Personal Care Assistance Level of Assistance  Bathing, Feeding, Dressing Bathing Assistance: Limited assistance Feeding assistance: Limited assistance Dressing Assistance: Limited assistance     Functional Limitations Info  Sight, Hearing, Speech Sight Info: Adequate Hearing Info: Adequate Speech Info: Adequate    SPECIAL CARE FACTORS FREQUENCY  PT (By licensed PT), OT (By licensed OT)     PT Frequency: 5 x week OT Frequency: 5 x week            Contractures Contractures Info: Not present    Additional Factors Info  Code Status, Allergies, Isolation Precautions Code Status Info: DNR Allergies Info: Iodine, Iodine I-131 T[place]ab, Shellfish Allergy, Codeine, Morphine  Sulfate, Irbesartan, Sulfa Antibiotics     Isolation Precautions Info: Contact precautions: MRSA     Current Medications (12/09/2023):  This is the current hospital active medication list Current Facility-Administered Medications  Medication Dose Route Frequency Provider Last Rate Last Admin   acetaminophen  (TYLENOL ) tablet 650 mg  650 mg Oral Q6H PRN Dew, Jason S, MD   650 mg at 12/07/23 1500   Or   acetaminophen  (TYLENOL ) suppository 650 mg  650 mg Rectal Q6H PRN Dew, Jason S, MD       albuterol  (PROVENTIL ) (2.5 MG/3ML) 0.083% nebulizer solution 2.5 mg  2.5 mg Nebulization Q2H PRN Dew, Jason S, MD   2.5 mg at 12/09/23 9150   anticoagulant sodium citrate solution 5 mL  5 mL Intracatheter PRN Marea Selinda RAMAN, MD       apixaban WINN) tablet 5 mg  5 mg Oral BID Kandis Devaughn Sayres, MD   5 mg at 12/09/23 0834   [START ON 12/10/2023] apixaban (ELIQUIS) tablet 5 mg  5 mg Oral BID Wouk, Devaughn Sayres, MD       aspirin  EC tablet 81 mg  81 mg Oral QHS Dew, Jason S, MD   81 mg at 12/08/23 2152   Chlorhexidine  Gluconate Cloth 2 % PADS 6 each  6 each Topical Q0600 Dew, Jason S, MD   6 each at 12/09/23 0645   cloNIDine  (CATAPRES ) tablet 0.2 mg  0.2 mg  Oral BID Kandis Devaughn Sayres, MD   0.2 mg at 12/09/23 9166   diltiazem (CARDIZEM CD) 24 hr capsule 120 mg  120 mg Oral Daily Decoste, Gabriella, PA-C   120 mg at 12/09/23 1224   epoetin  alfa-epbx (RETACRIT ) injection 4,000 Units  4,000 Units Intravenous Q T,Th,Sat-1800 Levorn Ramonita SQUIBB, NP   4,000 Units at 12/06/23 0902   guaiFENesin -dextromethorphan  (ROBITUSSIN DM) 100-10 MG/5ML syrup 5 mL  5 mL Oral Q4H PRN Dew, Jason S, MD   5 mL at 11/28/23 1301   heparin  injection 1,000 Units  1,000 Units Intracatheter PRN Marea Selinda RAMAN, MD       hydrALAZINE  (APRESOLINE ) injection 20 mg  20 mg Intravenous Q6H PRN Dew, Jason S, MD   20 mg at 11/30/23 1322   insulin  aspart (novoLOG ) injection 0-5 Units  0-5 Units Subcutaneous QHS Dew, Jason S, MD   3 Units at 12/08/23 2200   insulin  aspart (novoLOG ) injection 0-9 Units  0-9 Units Subcutaneous TID WC Marea Selinda  S, MD   3 Units at 12/09/23 1223   insulin  aspart (novoLOG ) injection 3 Units  3 Units Subcutaneous TID WC Wouk, Devaughn Sayres, MD   3 Units at 12/09/23 1223   insulin  glargine-yfgn (SEMGLEE ) injection 10 Units  10 Units Subcutaneous QHS Wouk, Devaughn Sayres, MD       lactose free nutrition (BOOST PLUS) liquid 237 mL  237 mL Oral TID WC Kadali, Renuka A, MD   237 mL at 12/07/23 0844   lactulose  (CHRONULAC ) 10 GM/15ML solution 30 g  30 g Oral Once Wouk, Devaughn Sayres, MD       lidocaine  (XYLOCAINE ) 1 % (with pres) injection 10 mL  10 mL Intradermal Once Esco, Miechia A, MD       losartan  (COZAAR ) tablet 100 mg  100 mg Oral QHS Dew, Jason S, MD   100 mg at 12/08/23 2153   metoprolol  succinate (TOPROL -XL) 24 hr tablet 100 mg  100 mg Oral QHS Dew, Jason S, MD   100 mg at 12/08/23 2205   multivitamin (RENA-VIT) tablet 1 tablet  1 tablet Oral QHS Kadali, Renuka A, MD   1 tablet at 12/09/23 1225   ondansetron  (ZOFRAN ) tablet 4 mg  4 mg Oral Q6H PRN Dew, Jason S, MD   4 mg at 12/05/23 2306   Or   ondansetron  (ZOFRAN ) injection 4 mg  4 mg Intravenous Q6H PRN Dew, Jason  S, MD   4 mg at 12/04/23 1248   oxyCODONE -acetaminophen  (PERCOCET/ROXICET) 5-325 MG per tablet 1 tablet  1 tablet Oral Q6H PRN Dew, Jason S, MD   1 tablet at 12/05/23 2305   polyethylene glycol (MIRALAX  / GLYCOLAX ) packet 17 g  17 g Oral Daily Wouk, Devaughn Sayres, MD       sucralfate  (CARAFATE ) tablet 1 g  1 g Oral BID PRN Dew, Jason S, MD       vancomycin  variable dose per unstable renal function (pharmacist dosing)   Does not apply See admin instructions Zeigler, Dustin G, RPH       zolpidem  (AMBIEN ) tablet 2.5 mg  2.5 mg Oral QHS PRN Wouk, Devaughn Sayres, MD         Discharge Medications: Please see discharge summary for a list of discharge medications.  Relevant Imaging Results:  Relevant Lab Results:   Additional Information SS#: [id number]. New dialysis. Set up at [facility] 12:10 pm.  Lauraine JAYSON Carpen, LCSW

## 2023-12-09 NOTE — Progress Notes (Signed)
 \ PROGRESS NOTE   HPI was taken from Dr. Cleatus: Mckenzie Lawrence is a 78 y.o. female with medical history significant for COPD, CVA, stage 4 CKD, SIADH, esophageal dysphagia treated with Botox  (last in 05/2023), DM, HLD, and HTN, last hospitalized from 2/1 to 07/26/2023 with MRSA bacteremia/sepsis/multifocal pneumonia, now presenting by EMS with shortness of breath and chest pressure.  Daughter at bedside gives history and states that her mother was in her usual state of health until earlier in the day on 6/12 when she developed shortness of breath while getting an MRI on her back.  His symptoms seem to improve however on the morning of 6/13 she awoke at 2 AM gasping for breath and called EMS.  She states patient's blood pressure had been running high lately due to ongoing back pain and she had also complained of swelling in her legs.  She has no orthopnea and sleeps on 1 pillow.  She has a chronic congested cough that has not changed and has no fever or chills  BP on arrival 202/74 and tachypneic to 25 saturating at 98% on room air.  Afebrile and pulse in the 70s.  Labs notable for WBC 11,000 with hemoglobin of 9 which is her baseline. Troponin 19 and BNP 2450 Creatinine 3.59 up from baseline of 2.74 with bicarb 19.  Glucose 207, potassium 5.2, sodium 123 (137 four months ago) EKG with sinus rhythm at 79 Chest x-ray showing small right pleural effusion and vascular congestion Bilateral lower extremity venous ultrasound ordered-result pending. Patient treated with DuoNeb and Solu-Medrol  and given a dose of Lasix  Hospitalist consulted for admission for COPD exacerbation and possible new onset CHF.      Mckenzie Lawrence  FMW:[id number] DOB: 1946/01/19 DOA: 11/28/2023 PCP: [name], MD   Assessment & Plan:   Principal Problem:   Hypertensive urgency/emergency Active Problems:   COPD with acute exacerbation (HCC)   Acute dyspnea   CAD (coronary artery disease)   Chest pain   Acute renal  failure superimposed on stage 4 chronic kidney disease (HCC)   Hyponatremia   Hypertensive emergency   Encounter for dialysis catheter care [facility])   Paroxysmal atrial fibrillation (HCC)  Assessment and Plan:  MRSA bacteremia: fever on 6/19, blood cultures are positive. Hx mrsa bacteremia 2/24 2/2 infected spinal hardware, had recurrent bout of mrsa bacteremia earlier this year no source identified. Per daughter no overt signs infection on mri obtained earlier this month. Has dialysis catheter placed this hospitalization, removed by vascular on 6/21. PIV also exchanged Repeat blood cultures ordered 6/22, ngtd. TTE/TEE negative. ID following. Plan for tunneled dialysis cath tomorrow.  Will likely receive abx with dialysis  Paroxysmal a-fib: new overnight 6/22, now back in sinus. Diltiazem and apixaban started. Tsh wnl  Hypertensive urgency/emergency: resolved, BPs appropriate today Continue on metoprolol , losartan . New dilt for amlodipine  as above. Have increased clonidine  dose . IV hydralazine  prn   Acute diastolic CHF: w/ elevated BNP, CXR showing vascular congestion, pitting LE edema & dyspnea. Monitor I/Os. Continue on metoprolol , losartan . Fluid/volume management w/ HD. Echo shows EF 50-55%, grade II diastolic dysfunction, no regional wall motion abnormalities. Cardio following and recs apprec    COPD exacerbation: tessalon , bronchodilators & encourage incentive spirometry. Off steroids 6/18, appears resolved, satting 100% on room air today, no wheeze.  Chest pain: w/ hx of CAD. Troponins are minimally elevated. EKG shows no ischemic changes. Continue on losartan , metoprolol , statin, aspirin . No CP currently   ESRD progressing from CKDIV: C /  p tunneled HD cath placed. Started on HD 12/02/23. Will need outpatient HD spot prior to d/c.  Nephro following and recs apprec. Dialysis on tts schedule. Has outpatient chair.   Anxiety: severity unknown. Valium  discontinued 6/19  Sedation:  resolved  Hyperkalemia: will managed w/ HD    Low back pain: see above   Hyponatremia: w/ hx of SIADH. Labile. Will be managed w/ HD. Nephro following and recs apprec   ACD: likely secondary to CKD. H&H are stable  Acute diarrhea: x 3 days. Resolved as per pt    Esophageal dysphagia: s/p botox  via EGD in 08/2022. Aspiration precautions. Continue w/ supportive care   OSA: CPAP qhs  DM2: well controlled at baseline, HbA1c 6.1. sugars labile here, on semglee /ssi  Insomnia: decreased zolpidem  as above  Debility: PT rec is now snf, toc consulted.    Constipation: reports stools once weekly at baseline,  declines lactulose . Is on miralax .     DVT prophylaxis: heparin  Code Status: dnr  Family Communication: daughter at bedside 6/24 Disposition Plan:  snf  Level of care: Progressive  Status is: Inpatient Remains inpatient appropriate because: severity of illness   Consultants:  Nephro, cardiology, ID   Antimicrobials: vancomycin   Subjective: Feeling lethargic, no acute complaints  Objective: Vitals:   12/09/23 0801 12/09/23 0833 12/09/23 1133 12/09/23 1224  BP: (!) 143/60 (!) 143/60 (!) 135/47 (!) 135/47  Pulse: 72  75   Resp: 20  20   Temp: 98.8 F (37.1 C)  100.2 F (37.9 C)   TempSrc: Oral  Oral   SpO2: 94%  98%   Weight:      Height:        Intake/Output Summary (Last 24 hours) at 12/09/2023 1342 Last data filed at 12/09/2023 0845 Gross per 24 hour  Intake 200 ml  Output 300 ml  Net -100 ml    Filed Weights   12/07/23 0500 12/08/23 0500 12/09/23 0500  Weight: 77.6 kg 78.8 kg 79.1 kg    Examination:  General exam: appears chronically ill, NAD Respiratory system: bibasilar rales otherwise clear Cardiovascular system: S1/S2+. No rubs or gallops  Gastrointestinal system: abd is soft, NT  Central nervous system: alert & oriented. Moving all 4 Psychiatry: judgement and insight appears at baseline. Flat mood and affect    Data Reviewed: I have  personally reviewed following labs and imaging studies  CBC: Recent Labs  Lab 12/03/23 0352 12/04/23 0508 12/06/23 0750 12/07/23 1219 12/09/23 0509  WBC 12.1* 12.1* 15.6* 13.2* 13.8*  HGB 7.8* 8.6* 8.2* 7.1* 7.3*  HCT 23.6* 27.7* 25.2* 21.6* 22.8*  MCV 90.1 93.9 94.0 93.9 93.1  PLT 222 228 193 150 209   Basic Metabolic Panel: Recent Labs  Lab 12/03/23 0352 12/04/23 0508 12/06/23 0750 12/07/23 1219 12/09/23 0509  NA 130* 131* 130* 132* 132*  K 4.7 4.2 3.9 3.5 3.8  CL 98 97* 93* 94* 95*  CO2 24 26 26 27 26   GLUCOSE 224* 116* 182* 238* 218*  BUN 61* 38* 32* 28* 41*  CREATININE 3.19* 2.59* 3.03* 2.65* 3.78*  CALCIUM  8.1* 8.0* 7.9* 7.7* 8.1*  PHOS  --   --  4.0 2.8  --    GFR: Estimated Creatinine Clearance: 13.7 mL/min (A) (by C-G formula based on SCr of 3.78 mg/dL (H)). Liver Function Tests: Recent Labs  Lab 12/06/23 0750 12/07/23 1219  ALBUMIN 2.0* 1.8*   No results for input(s): LIPASE, AMYLASE in the last 168 hours. No results for input(s): AMMONIA in the  last 168 hours. Coagulation Profile: No results for input(s): INR, PROTIME in the last 168 hours. Cardiac Enzymes: No results for input(s): CKTOTAL, CKMB, CKMBINDEX, TROPONINI in the last 168 hours. BNP (last 3 results) No results for input(s): PROBNP in the last 8760 hours. HbA1C: No results for input(s): HGBA1C in the last 72 hours. CBG: Recent Labs  Lab 12/08/23 1118 12/08/23 1634 12/08/23 2154 12/09/23 0759 12/09/23 1134  GLUCAP 204* 187* 255* 216* 220*   Lipid Profile: No results for input(s): CHOL, HDL, LDLCALC, TRIG, CHOLHDL, LDLDIRECT in the last 72 hours. Thyroid Function Tests: Recent Labs    12/09/23 0509  TSH 2.099   Anemia Panel: No results for input(s): VITAMINB12, FOLATE, FERRITIN, TIBC, IRON, RETICCTPCT in the last 72 hours. Sepsis Labs: No results for input(s): PROCALCITON, LATICACIDVEN in the last 168 hours.  Recent Results  (from the past 240 hours)  Culture, blood (Routine X 2) w Reflex to ID Panel     Status: Abnormal (Preliminary result)   Collection Time: 12/04/23  6:34 PM   Specimen: BLOOD  Result Value Ref Range Status   Specimen Description   Final    BLOOD BLOOD RIGHT ARM Performed at [facility], [address], [place], [place] [address]    Special Requests   Final    BOTTLES DRAWN AEROBIC AND ANAEROBIC Blood Culture adequate volume Performed at [facility], [address] [place], [place] [address]    Culture  Setup Time   Final    Organism ID to follow IN BOTH AEROBIC AND ANAEROBIC BOTTLES GRAM POSITIVE COCCI CRITICAL RESULT CALLED TO, READ BACK BY AND VERIFIED WITH: SELINDA SIMPERS 12/06/2023 AT 9351 SRR Performed at [facility], [address] [place], [place] [address]    Culture (A)  Final    METHICILLIN RESISTANT STAPHYLOCOCCUS AUREUS Sent to [facility] for further susceptibility testing. Performed at [facility], [address], [place], [place] [address]    Report Status PENDING  Incomplete   Organism ID, Bacteria METHICILLIN RESISTANT STAPHYLOCOCCUS AUREUS  Final      Susceptibility   Methicillin resistant staphylococcus aureus - MIC*    CIPROFLOXACIN  >=8 RESISTANT Resistant     ERYTHROMYCIN  >=8 RESISTANT Resistant     GENTAMICIN  <=0.5 SENSITIVE Sensitive     OXACILLIN >=4 RESISTANT Resistant     TETRACYCLINE <=1 SENSITIVE Sensitive     VANCOMYCIN  2 SENSITIVE Sensitive     TRIMETH/SULFA <=10 SENSITIVE Sensitive     CLINDAMYCIN <=0.25 SENSITIVE Sensitive     RIFAMPIN <=0.5 SENSITIVE Sensitive     Inducible Clindamycin NEGATIVE Sensitive     LINEZOLID  2 SENSITIVE Sensitive     * METHICILLIN RESISTANT STAPHYLOCOCCUS AUREUS  Culture, blood (Routine X 2) w Reflex to ID Panel     Status: Abnormal   Collection Time: 12/04/23  6:34 PM   Specimen: BLOOD  Result Value Ref Range Status   Specimen Description   Final    BLOOD BLOOD LEFT  ARM Performed at [facility], [address] [place], [place] [address]    Special Requests   Final    BOTTLES DRAWN AEROBIC AND ANAEROBIC Blood Culture adequate volume Performed at [facility], [address] [place], [place] [address]    Culture  Setup Time   Final    GRAM POSITIVE COCCI IN BOTH AEROBIC AND ANAEROBIC BOTTLES CRITICAL VALUE NOTED.  VALUE IS CONSISTENT WITH PREVIOUSLY REPORTED AND CALLED VALUE. Performed at [facility], [address] [place], [place] [address]    Culture (A)  Final    STAPHYLOCOCCUS AUREUS SUSCEPTIBILITIES PERFORMED ON PREVIOUS CULTURE WITHIN THE LAST 5 DAYS. Performed at [facility], [address], [place], [place] [address]    Report Status 12/08/2023 FINAL  Final  Blood Culture ID Panel (Reflexed)     Status: Abnormal   Collection Time: 12/04/23  6:34 PM  Result Value Ref Range Status   Enterococcus faecalis NOT DETECTED NOT DETECTED Final   Enterococcus Faecium NOT DETECTED NOT DETECTED Final   Listeria monocytogenes NOT DETECTED NOT DETECTED Final   Staphylococcus species DETECTED (A) NOT DETECTED Final    Comment: CRITICAL RESULT CALLED TO, READ BACK BY AND VERIFIED WITH: JASON ROBBINS 12/06/2023 AT 0648 SRR    Staphylococcus aureus (BCID) DETECTED (A) NOT DETECTED Final    Comment: Methicillin (oxacillin)-resistant Staphylococcus aureus (MRSA). MRSA is predictably resistant to beta-lactam antibiotics (except ceftaroline). Preferred therapy is vancomycin  unless clinically contraindicated. Patient requires contact precautions if  hospitalized. CRITICAL RESULT CALLED TO, READ BACK BY AND VERIFIED WITH: JASON ROBBINS 12/06/2023 AT 0648 SRR    Staphylococcus epidermidis NOT DETECTED NOT DETECTED Final   Staphylococcus lugdunensis NOT DETECTED NOT DETECTED Final   Streptococcus species NOT DETECTED NOT DETECTED Final   Streptococcus agalactiae NOT DETECTED NOT DETECTED Final   Streptococcus  pneumoniae NOT DETECTED NOT DETECTED Final   Streptococcus pyogenes NOT DETECTED NOT DETECTED Final   A.calcoaceticus-baumannii NOT DETECTED NOT DETECTED Final   Bacteroides fragilis NOT DETECTED NOT DETECTED Final   Enterobacterales NOT DETECTED NOT DETECTED Final   Enterobacter cloacae complex NOT DETECTED NOT DETECTED Final   Escherichia coli NOT DETECTED NOT DETECTED Final   Klebsiella aerogenes NOT DETECTED NOT DETECTED Final   Klebsiella oxytoca NOT DETECTED NOT DETECTED Final   Klebsiella pneumoniae NOT DETECTED NOT DETECTED Final   Proteus species NOT DETECTED NOT DETECTED Final   Salmonella species NOT DETECTED NOT DETECTED Final   Serratia marcescens NOT DETECTED NOT DETECTED Final   Haemophilus influenzae NOT DETECTED NOT DETECTED Final   Neisseria meningitidis NOT DETECTED NOT DETECTED Final   Pseudomonas aeruginosa NOT DETECTED NOT DETECTED Final   Stenotrophomonas maltophilia NOT DETECTED NOT DETECTED Final   Candida albicans NOT DETECTED NOT DETECTED Final   Candida auris NOT DETECTED NOT DETECTED Final   Candida glabrata NOT DETECTED NOT DETECTED Final   Candida krusei NOT DETECTED NOT DETECTED Final   Candida parapsilosis NOT DETECTED NOT DETECTED Final   Candida tropicalis NOT DETECTED NOT DETECTED Final   Cryptococcus neoformans/gattii NOT DETECTED NOT DETECTED Final   Meth resistant mecA/C and MREJ DETECTED (A) NOT DETECTED Final    Comment: CRITICAL RESULT CALLED TO, READ BACK BY AND VERIFIED WITH: JASON ROBBINS 12/06/2023 AT 9351 SRR Performed at [facility], [address] [place], [place] [address]   SARS Coronavirus 2 by RT PCR (hospital order, performed in [facility] hospital lab) *cepheid single result test* Anterior Nasal Swab     Status: None   Collection Time: 12/04/23  8:40 PM   Specimen: Anterior Nasal Swab  Result Value Ref Range Status   SARS Coronavirus 2 by RT PCR NEGATIVE NEGATIVE Final    Comment: (NOTE) SARS-CoV-2 target nucleic  acids are NOT DETECTED.  The SARS-CoV-2 RNA is generally detectable in upper and lower respiratory specimens during the acute phase of infection. The lowest concentration of SARS-CoV-2 viral copies this assay can detect is 250 copies / mL. A negative result does not preclude  SARS-CoV-2 infection and should not be used as the sole basis for treatment or other patient management decisions.  A negative result may occur with improper specimen collection / handling, submission of specimen other than nasopharyngeal swab, presence of viral mutation(s) within the areas targeted by this assay, and inadequate number of viral copies (<250 copies / mL). A negative result must be combined with clinical observations, patient history, and epidemiological information.  Fact Sheet for Patients:   RoadLapTop.co.za  Fact Sheet for Healthcare Providers: http://kim-miller.com/  This test is not yet approved or  cleared by the [place]  FDA and has been authorized for detection and/or diagnosis of SARS-CoV-2 by FDA under an Emergency Use Authorization (EUA).  This EUA will remain in effect (meaning this test can be used) for the duration of the COVID-19 declaration under Section 564(b)(1) of the Act, 21 U.S.C. section 360bbb-3(b)(1), unless the authorization is terminated or revoked sooner.  Performed at [facility], [address], [place], [place] [address]   Urine Culture (for pregnant, neutropenic or urologic patients or patients with an indwelling urinary catheter)     Status: Abnormal   Collection Time: 12/04/23  8:45 PM   Specimen: Urine, Clean Catch  Result Value Ref Range Status   Specimen Description   Final    URINE, CLEAN CATCH Performed at [facility], [address], [place], [place] [address]    Special Requests   Final    NONE Performed at [facility], [address], [place], [place] [address]     Culture (A)  Final    <10,000 COLONIES/mL INSIGNIFICANT GROWTH Performed at [facility], [address] [place], [place] [address]    Report Status 12/06/2023 FINAL  Final  Respiratory (~20 pathogens) panel by PCR     Status: None   Collection Time: 12/04/23 11:30 PM   Specimen: Nasopharyngeal Swab; Respiratory  Result Value Ref Range Status   Adenovirus NOT DETECTED NOT DETECTED Final   Coronavirus 229E NOT DETECTED NOT DETECTED Final    Comment: (NOTE) The Coronavirus on the Respiratory Panel, DOES NOT test for the novel  Coronavirus (2019 nCoV)    Coronavirus HKU1 NOT DETECTED NOT DETECTED Final   Coronavirus NL63 NOT DETECTED NOT DETECTED Final   Coronavirus OC43 NOT DETECTED NOT DETECTED Final   Metapneumovirus NOT DETECTED NOT DETECTED Final   Rhinovirus / Enterovirus NOT DETECTED NOT DETECTED Final   Influenza A NOT DETECTED NOT DETECTED Final   Influenza B NOT DETECTED NOT DETECTED Final   Parainfluenza Virus 1 NOT DETECTED NOT DETECTED Final   Parainfluenza Virus 2 NOT DETECTED NOT DETECTED Final   Parainfluenza Virus 3 NOT DETECTED NOT DETECTED Final   Parainfluenza Virus 4 NOT DETECTED NOT DETECTED Final   Respiratory Syncytial Virus NOT DETECTED NOT DETECTED Final   Bordetella pertussis NOT DETECTED NOT DETECTED Final   Bordetella Parapertussis NOT DETECTED NOT DETECTED Final   Chlamydophila pneumoniae NOT DETECTED NOT DETECTED Final   Mycoplasma pneumoniae NOT DETECTED NOT DETECTED Final    Comment: Performed at [facility] [place] [facility], [address] [place], [place] [address]  Cath Tip Culture     Status: None (Preliminary result)   Collection Time: 12/06/23  2:45 PM   Specimen: Catheter Tip; Other  Result Value Ref Range Status   Specimen Description   Final    CATH TIP Performed at [facility], [address] [place], [place] [address]    Special Requests  Final    NONE Performed at [facility], [address] [place],  [place] [address]    Culture   Final    NO GROWTH 2 DAYS Performed at [facility], [address] [place], [place] [address]    Report Status PENDING  Incomplete  MRSA Next Gen by PCR, Nasal     Status: Abnormal   Collection Time: 12/07/23  9:00 AM   Specimen: Nasal Mucosa; Nasal Swab  Result Value Ref Range Status   MRSA by PCR Next Gen DETECTED (A) NOT DETECTED Final    Comment: RESULT CALLED TO, READ BACK BY AND VERIFIED WITH: ELIZABETH HAYS 12/07/23 1041 SLM (NOTE) The GeneXpert MRSA Assay (FDA approved for NASAL specimens only), is one component of a comprehensive MRSA colonization surveillance program. It is not intended to diagnose MRSA infection nor to guide or monitor treatment for MRSA infections. Test performance is not FDA approved in patients less than 66 years old. Performed at [facility], [address] [place], [place] [address]   Culture, blood (Routine X 2) w Reflex to ID Panel     Status: None (Preliminary result)   Collection Time: 12/07/23 12:50 PM   Specimen: BLOOD  Result Value Ref Range Status   Specimen Description BLOOD RIGHT ANTECUBITAL  Final   Special Requests   Final    BOTTLES DRAWN AEROBIC AND ANAEROBIC Blood Culture adequate volume   Culture   Final    NO GROWTH 2 DAYS Performed at [facility], [address] [place], [place] [address]    Report Status PENDING  Incomplete  Culture, blood (Routine X 2) w Reflex to ID Panel     Status: None (Preliminary result)   Collection Time: 12/07/23 12:50 PM   Specimen: BLOOD  Result Value Ref Range Status   Specimen Description BLOOD LEFT ANTECUBITAL  Final   Special Requests   Final    BOTTLES DRAWN AEROBIC AND ANAEROBIC Blood Culture adequate volume   Culture   Final    NO GROWTH 2 DAYS Performed at [facility], [address] [place], [place] [address]    Report Status PENDING  Incomplete         Radiology Studies: ECHO TEE Result Date: 12/08/2023     TRANSESOPHOGEAL ECHO REPORT   Patient Name:   NOELANI HARBACH Date of Exam: 12/08/2023 Medical Rec #:  [id number]      Height:       67.0 in Accession #:    [id number]     Weight:       173.7 lb Date of Birth:  1945/12/21      BSA:          1.905 m Patient Age:    78 years       BP:           131/52 mmHg Patient Gender: F              HR:           69 bpm. Exam Location:  [facility] Procedure: Transesophageal Echo, Cardiac Doppler, Color Doppler and 3D Echo            (Both Spectral and Color Flow Doppler were utilized during            procedure). Indications:     Bacteremia R78.81  History:         Patient has prior history of Echocardiogram examinations, most  recent 12/07/2023. Risk Factors:Hypertension and Sleep Apnea.  Sonographer:     Christopher Furnace Referring Phys:  [id number] DWAYNE D CALLWOOD Diagnosing Phys: Keller Paterson PROCEDURE: After discussion of the risks and benefits of a TEE, an informed consent was obtained from the patient. The transesophogeal probe was passed without difficulty through the esophogus of the patient. Local oropharyngeal anesthetic was provided with Cetacaine . Sedation performed by different physician. The patient was monitored while under deep sedation. Image quality was excellent. The patient's vital signs; including heart rate, blood pressure, and oxygen saturation; remained stable throughout the procedure. The patient developed no complications during the procedure.  IMPRESSIONS  1. Left ventricular ejection fraction, by estimation, is 50 to 55%. The left ventricle has low normal function.  2. Right ventricular systolic function is normal. The right ventricular size is normal.  3. No left atrial/left atrial appendage thrombus was detected.  4. The mitral valve is normal in structure. Trivial mitral valve regurgitation.  5. The aortic valve is tricuspid. There is mild thickening of the aortic valve. Aortic valve regurgitation is not visualized.  6. 3D performed of the mitral  valve and demonstrates no valvular vegetation. Conclusion(s)/Recommendation(s): No evidence of vegetation/infective endocarditis on this transesophageael echocardiogram. FINDINGS  Left Ventricle: Left ventricular ejection fraction, by estimation, is 50 to 55%. The left ventricle has low normal function. The left ventricular internal cavity size was normal in size. Right Ventricle: The right ventricular size is normal. No increase in right ventricular wall thickness. Right ventricular systolic function is normal. Left Atrium: Left atrial size was normal in size. No left atrial/left atrial appendage thrombus was detected. Right Atrium: Right atrial size was normal in size. Pericardium: There is no evidence of pericardial effusion. Mitral Valve: The mitral valve is normal in structure. Trivial mitral valve regurgitation. Tricuspid Valve: The tricuspid valve is normal in structure. Tricuspid valve regurgitation is trivial. Aortic Valve: The aortic valve is tricuspid. There is mild thickening of the aortic valve. Aortic valve regurgitation is not visualized. Pulmonic Valve: The pulmonic valve was normal in structure. Pulmonic valve regurgitation is trivial. Aorta: The aortic root is normal in size and structure. IAS/Shunts: No atrial level shunt detected by color flow Doppler. Additional Comments: 3D was performed not requiring image post processing on an independent workstation and was normal. Keller Paterson Electronically signed by Keller Paterson Signature Date/Time: 12/08/2023/5:20:23 PM    Final           Scheduled Meds:  apixaban  5 mg Oral BID   [START ON 12/10/2023] apixaban  5 mg Oral BID   aspirin  EC  81 mg Oral QHS   Chlorhexidine  Gluconate Cloth  6 each Topical Q0600   cloNIDine   0.2 mg Oral BID   diltiazem  120 mg Oral Daily   epoetin  alfa-epbx (RETACRIT ) injection  4,000 Units Intravenous Q T,Th,Sat-1800   insulin  aspart  0-5 Units Subcutaneous QHS   insulin  aspart  0-9 Units Subcutaneous TID  WC   insulin  aspart  3 Units Subcutaneous TID WC   insulin  glargine-yfgn  10 Units Subcutaneous QHS   lactose free nutrition  237 mL Oral TID WC   lactulose   30 g Oral Once   lidocaine   10 mL Intradermal Once   losartan   100 mg Oral QHS   metoprolol  succinate  100 mg Oral QHS   multivitamin  1 tablet Oral QHS   polyethylene glycol  17 g Oral Daily   vancomycin  variable dose per unstable renal function (pharmacist dosing)  Does not apply See admin instructions   Continuous Infusions:  anticoagulant sodium citrate       LOS: 11 days       Devaughn KATHEE Ban, MD [facility]  If 7PM-7AM, please contact night-coverage [email or web address] 12/09/2023, 1:42 PM

## 2023-12-10 ENCOUNTER — Encounter
Admission: EM | Disposition: A | Discharge status: Skilled Nursing Facility | Payer: Self-pay | Source: Home / Self Care | Attending: Obstetrics and Gynecology

## 2023-12-10 ENCOUNTER — Inpatient Hospital Stay: Admitting: Certified Registered"

## 2023-12-10 ENCOUNTER — Encounter: Payer: Self-pay | Admitting: Vascular Surgery

## 2023-12-10 DIAGNOSIS — N186 End stage renal disease: Secondary | ICD-10-CM | POA: Diagnosis not present

## 2023-12-10 DIAGNOSIS — I161 Hypertensive emergency: Secondary | ICD-10-CM | POA: Diagnosis not present

## 2023-12-10 DIAGNOSIS — I5033 Acute on chronic diastolic (congestive) heart failure: Secondary | ICD-10-CM | POA: Insufficient documentation

## 2023-12-10 DIAGNOSIS — A4102 Sepsis due to Methicillin resistant Staphylococcus aureus: Secondary | ICD-10-CM

## 2023-12-10 HISTORY — PX: DIALYSIS/PERMA CATHETER INSERTION: CATH118288

## 2023-12-10 LAB — CATH TIP CULTURE: Culture: NO GROWTH

## 2023-12-10 LAB — CBC
HCT: 19.8 % — ABNORMAL LOW (ref 36.0–46.0)
Hemoglobin: 6.3 g/dL — ABNORMAL LOW (ref 12.0–15.0)
MCH: 29.4 pg (ref 26.0–34.0)
MCHC: 31.8 g/dL (ref 30.0–36.0)
MCV: 92.5 fL (ref 80.0–100.0)
Platelets: 212 10*3/uL (ref 150–400)
RBC: 2.14 MIL/uL — ABNORMAL LOW (ref 3.87–5.11)
RDW: 14.1 % (ref 11.5–15.5)
WBC: 13.8 10*3/uL — ABNORMAL HIGH (ref 4.0–10.5)
nRBC: 0 % (ref 0.0–0.2)

## 2023-12-10 LAB — RENAL FUNCTION PANEL
Albumin: 1.7 g/dL — ABNORMAL LOW (ref 3.5–5.0)
Anion gap: 12 (ref 5–15)
BUN: 51 mg/dL — ABNORMAL HIGH (ref 8–23)
CO2: 24 mmol/L (ref 22–32)
Calcium: 7.8 mg/dL — ABNORMAL LOW (ref 8.9–10.3)
Chloride: 91 mmol/L — ABNORMAL LOW (ref 98–111)
Creatinine, Ser: 4.46 mg/dL — ABNORMAL HIGH (ref 0.44–1.00)
GFR, Estimated: 10 mL/min — ABNORMAL LOW (ref >60)
Glucose, Bld: 238 mg/dL — ABNORMAL HIGH (ref 70–99)
Phosphorus: 5.4 mg/dL — ABNORMAL HIGH (ref 2.5–4.6)
Potassium: 3.6 mmol/L (ref 3.5–5.1)
Sodium: 127 mmol/L — ABNORMAL LOW (ref 135–145)

## 2023-12-10 LAB — GLUCOSE, CAPILLARY
Glucose-Capillary: 150 mg/dL — ABNORMAL HIGH (ref 70–99)
Glucose-Capillary: 204 mg/dL — ABNORMAL HIGH (ref 70–99)
Glucose-Capillary: 165 mg/dL — ABNORMAL HIGH (ref 70–99)
Glucose-Capillary: 263 mg/dL — ABNORMAL HIGH (ref 70–99)

## 2023-12-10 LAB — VANCOMYCIN, RANDOM: Vancomycin Rm: 21 ug/mL

## 2023-12-10 LAB — SEDIMENTATION RATE: Sed Rate: 127 mm/h — ABNORMAL HIGH (ref 0–30)

## 2023-12-10 LAB — PREPARE RBC (CROSSMATCH)

## 2023-12-10 SURGERY — DIALYSIS/PERMA CATHETER INSERTION
Anesthesia: Moderate Sedation

## 2023-12-10 MED ORDER — HEPARIN SODIUM (PORCINE) 10000 UNIT/ML IJ SOLN
INTRAMUSCULAR | Status: DC | PRN
  Administered 2023-12-10: 10000 [IU]

## 2023-12-10 MED ORDER — FENTANYL CITRATE PF 50 MCG/ML IJ SOSY
PREFILLED_SYRINGE | INTRAMUSCULAR | Status: AC
  Filled 2023-12-10: qty 1

## 2023-12-10 MED ORDER — DIPHENHYDRAMINE HCL 50 MG/ML IJ SOLN: 50.0000 mg | Freq: Once | INTRAMUSCULAR | Status: DC | PRN

## 2023-12-10 MED ORDER — MIDAZOLAM HCL 2 MG/2ML IJ SOLN
INTRAMUSCULAR | Status: DC | PRN
  Administered 2023-12-10 (×2): 1 mg via INTRAVENOUS
  Administered 2023-12-10 (×2): .5 mg via INTRAVENOUS
  Administered 2023-12-10: 1 mg via INTRAVENOUS

## 2023-12-10 MED ORDER — FAMOTIDINE 20 MG PO TABS: 40.0000 mg | ORAL_TABLET | Freq: Once | ORAL | Status: DC | PRN

## 2023-12-10 MED ORDER — CEFAZOLIN (ANCEF) 1 G IV SOLR
INTRAVENOUS | Status: DC | PRN
  Administered 2023-12-10: 1 g

## 2023-12-10 MED ORDER — FENTANYL CITRATE (PF) 100 MCG/2ML IJ SOLN
INTRAMUSCULAR | Status: DC | PRN
  Administered 2023-12-10 (×4): 25 ug via INTRAVENOUS

## 2023-12-10 MED ORDER — METHYLPREDNISOLONE SODIUM SUCC 125 MG IJ SOLR: 125.0000 mg | Freq: Once | INTRAMUSCULAR | Status: DC | PRN

## 2023-12-10 MED ORDER — CEFAZOLIN SODIUM-DEXTROSE 2-4 GM/100ML-% IV SOLN
2.0000 g | INTRAVENOUS | Status: DC
Start: 2023-12-10 — End: 2023-12-10

## 2023-12-10 MED ORDER — MIDAZOLAM HCL 2 MG/ML PO SYRP: 8.0000 mg | ORAL_SOLUTION | Freq: Once | ORAL | Status: DC | PRN

## 2023-12-10 MED ORDER — MIDAZOLAM HCL 2 MG/2ML IJ SOLN
INTRAMUSCULAR | Status: AC
Start: 2023-12-10 — End: 2023-12-10
  Filled 2023-12-10: qty 2

## 2023-12-10 MED ORDER — VANCOMYCIN HCL IN DEXTROSE 1-5 GM/200ML-% IV SOLN
1000.0000 mg | INTRAVENOUS | Status: AC
  Filled 2023-12-10 (×2): qty 200

## 2023-12-10 MED ORDER — LIDOCAINE HCL (PF) 1 % IJ SOLN
INTRAMUSCULAR | Status: DC | PRN
  Administered 2023-12-10: 20 mL

## 2023-12-10 MED ORDER — HEPARIN (PORCINE) IN NACL 1000-0.9 UT/500ML-% IV SOLN
INTRAVENOUS | Status: DC | PRN
Start: 2023-12-10 — End: 2023-12-10
  Administered 2023-12-10: 500 mL

## 2023-12-10 MED ORDER — SODIUM CHLORIDE 0.9% IV SOLUTION
Freq: Once | INTRAVENOUS | Status: DC
Start: 2023-12-10 — End: 2023-12-15

## 2023-12-10 MED ORDER — CEFAZOLIN SODIUM-DEXTROSE 1-4 GM/50ML-% IV SOLN
INTRAVENOUS | Status: AC
  Filled 2023-12-10: qty 50

## 2023-12-10 SURGICAL SUPPLY — 11 items
CATH CANNON HEMO 15FR 23CM (HEMODIALYSIS SUPPLIES) IMPLANT
COVER PROBE ULTRASOUND 5X96 (MISCELLANEOUS) IMPLANT
DERMABOND ADVANCED .7 DNX12 (GAUZE/BANDAGES/DRESSINGS) IMPLANT
DRAPE INCISE 23X17 STRL (DRAPES) IMPLANT
DRAPE INCISE IOBAN 23X17 STRL (DRAPES) ×1 IMPLANT
NDL ENTRY 21GA 7CM ECHOTIP (NEEDLE) IMPLANT
NEEDLE ENTRY 21GA 7CM ECHOTIP (NEEDLE) ×1 IMPLANT
PACK ANGIOGRAPHY (CUSTOM PROCEDURE TRAY) ×2 IMPLANT
SET INTRO CAPELLA COAXIAL (SET/KITS/TRAYS/PACK) IMPLANT
SUT MNCRL AB 4-0 PS2 18 (SUTURE) IMPLANT
SUT SILK 0 FSL (SUTURE) IMPLANT

## 2023-12-10 NOTE — Progress Notes (Signed)
 Pharmacy Antibiotic Note  Mckenzie Lawrence is a 78 y.o. female admitted on 11/28/2023 with hypertensive urgency/emergency, possible new onset CHF, and acute renal failure on CKD. Patient has PMH of DM, lumbar spinal fusion, HLD, and CKD. Patient had PermCath placed on 6/16 and was started on HD. Blood cultures from 6/19 showing MRSA Bactermia. Permath removed 6/21.  Pharmacy has been consulted for Vancomycin  dosing.  Today, 12/10/2023 Day #5 vancomycin  Renal: AKI on CKD with new start of HD this admission with development of likely line infection with MRSA Patient with residual renal function (still making urine) WBC 13.8 afebrile 6/21 Cath tip culture: NGTD 6/22 repeat Bcx: NGTD 6/23 TEE: no evidence of vegetation 6/25 tunneled Left internal jugular HD catheter placed  Vancomycin  levels: Vancomycin  1500mg  IV x 1 given 6/21 at 0929 6/22 Random vancomycin  level at 0657 = 13 mcg/ml 6/23 Random vancomycin  level at 0346 = 9 mcg/ml Vancomycin  1500mg  IV x1 given 6/23 at 1610 6/24 random vancomycin  level at 0509 = 27 mcg/ml 6/25 random vancomycin  level at 0326 = 21 mcg/ml.  Estimated Half-life based on 6/24 and 6/25 levels is ~62 hours  Plan: HD line placed 6/25 and anticipate plan is for hemodialyses today - will give vancomycin  1gm IV with HD Due to residual renal function, anticipate may need higher dose with HD.  Plan will be vancomycin  1gm IV qHD ID following Daptomycin  MIC ordered 6/24 - sent to [facility]  Height: 5' 7 (170.2 cm) Weight: 81.1 kg (178 lb 12.7 oz) IBW/kg (Calculated) : 61.6  Temp (24hrs), Avg:98.5 F (36.9 C), Min:97.7 F (36.5 C), Max:100.2 F (37.9 C)  Recent Labs  Lab 12/04/23 0508 12/06/23 0750 12/07/23 0657 12/07/23 1219 12/08/23 0346 12/09/23 0509 12/10/23 0326  WBC 12.1* 15.6*  --  13.2*  --  13.8* 13.8*  CREATININE 2.59* 3.03*  --  2.65*  --  3.78*  --   VANCOTROUGH  --   --  12*  --   --   --   --   VANCORANDOM  --   --   --   --    < > 27 21   < >  = values in this interval not displayed.    Estimated Creatinine Clearance: 13.9 mL/min (A) (by C-G formula based on SCr of 3.78 mg/dL (H)).    Allergies  Allergen Reactions   Iodine Anaphylaxis and Other (See Comments)    Pt states that she is allergic to ingested iodine only, okay for betadine.     Iodine I-131 Tositumomab Anaphylaxis   Shellfish Allergy Anaphylaxis   Codeine Nausea And Vomiting   Morphine  Sulfate Nausea And Vomiting   Irbesartan Other (See Comments)     Unknown  (Avapro)   Sulfa Antibiotics Other (See Comments)    Fever     Antimicrobials this admission:  6/21 Vancomycin   >>   Dose adjustments this admission:   Microbiology results: 6/19  BCx: MRSA   Thank you for allowing pharmacy to be a part of this patient's care.  Eevee Borbon, PharmD, BCPS, BCIDP Work Cell: [phone] 12/10/2023 10:19 AM

## 2023-12-10 NOTE — Progress Notes (Signed)
 [facility] CLINIC CARDIOLOGY PROGRESS NOTE       Patient ID: [name] MRN: [id number] DOB/AGE: June 29, 1945 78 y.o.  Admit date: 11/28/2023 Referring Physician Dr. Delayne Solian Primary Physician [name], MD Primary Cardiologist Dr. Ammon Reason for Consultation diastolic heart failure with SOB.  HPI: Mckenzie Lawrence is a 78 y.o. female  with a past medical history of coronary artery disease, hypertension, hyperlipidemia, COPD, OSA, hx CVA, CKD stage 4, SIADH, diabetes mellitus who presented to the ED on 11/28/2023 for worsening and persistent SOB and lower extremity swelling with concurrent COPD exacerbation and found elevated BNP of 2500. Cardiology was consulted for further evaluation.   Interval History: -Patient seen and examined this afternoon. Patient laying comfortably in hospital bed with daughter at bedside. Patient states she feels okay. States she had some wheezing early this AM. SOB/wheezing improved s/p breathing treatment. -Patients BP improving and HR stable this AM. Per tele remains in SR, rate 70s (no more evidence of AF) -Patient remains on 4L with stable SpO2.  -Plan for Permacath placement today -repeat Blood cultures, no growth in 2 days   Review of systems complete and found to be negative unless listed above    Past Medical History:  Diagnosis Date   Anemia    Arthritis    Bladder incontinence    Broken foot    Cataracts, bilateral    Chronic kidney insufficiency    stage 3b   COPD (chronic obstructive pulmonary disease) (HCC)    wheezing   Coronary artery disease 04/25/2022   in [place]   COVID 2021   very mild case   CVA (cerebral vascular accident) (HCC) 2016   has had 3 strokes, states right side is slightlyweaker than left   Diabetes mellitus    insulin  dependent, Type 2   GERD (gastroesophageal reflux disease)    HLD (hyperlipidemia)    Hx of cardiovascular stress test    a. ETT (6/13):  Ex 5:13; no ischemic changes   Hypertension     controlled on meds   Lacunar stroke of left subthalamic region [facility]) 02/2015   Leg pain    left   Lower back pain    Neuromuscular disorder (HCC)    stroke right hand tingling   Orthostatic hypotension    Osteopenia 01/2017   T score -2.0 stable from prior DEXA   Pancreatitis 10/2021   PCOS (polycystic ovarian syndrome)    Personal history of tobacco use, presenting hazards to health 01/09/2015   PONV (postoperative nausea and vomiting)    Sleep apnea    uses CPAP nightly    Past Surgical History:  Procedure Laterality Date   BIOPSY  08/28/2022   Procedure: BIOPSY;  Surgeon: Elicia Claw, MD;  Location: [facility];  Service: Gastroenterology;;   BOTOX  INJECTION  01/15/2022   Procedure: BOTOX  INJECTION;  Surgeon: Rosalie Kitchens, MD;  Location: [facility];  Service: Gastroenterology;;   BOTOX  INJECTION N/A 08/28/2022   Procedure: BOTOX  INJECTION;  Surgeon: Elicia Claw, MD;  Location: [facility];  Service: Gastroenterology;  Laterality: N/A;   BOTOX  INJECTION Bilateral 05/27/2023   Procedure: BOTOX  INJECTION;  Surgeon: Rosalie Kitchens, MD;  Location: [facility];  Service: Gastroenterology;  Laterality: Bilateral;   BROW LIFT Bilateral 11/04/2017   Procedure: BLEPHAROPLASTY UPPER EYELID W/EXCESS SKIN;  Surgeon: Ashley Greig CHRISTELLA, MD;  Location: [facility];  Service: Ophthalmology;  Laterality: Bilateral;  DIABETES-insulin  dependent uses CPAP   CARDIAC CATHETERIZATION  20 yrs ago   found nothing  CARPAL TUNNEL RELEASE Bilateral    CATARACT EXTRACTION Bilateral    DIALYSIS/PERMA CATHETER INSERTION N/A 12/01/2023   Procedure: DIALYSIS/PERMA CATHETER INSERTION;  Surgeon: Marea Selinda RAMAN, MD;  Location: [facility];  Service: Cardiovascular;  Laterality: N/A;   ELBOW SURGERY Bilateral    ESOPHAGOGASTRODUODENOSCOPY N/A 07/25/2021   Procedure: ESOPHAGOGASTRODUODENOSCOPY (EGD);  Surgeon: Toledo, Ladell POUR, MD;  Location: [facility];  Service: Gastroenterology;   Laterality: N/A;  IDDM   ESOPHAGOGASTRODUODENOSCOPY N/A 01/15/2022   Procedure: ESOPHAGOGASTRODUODENOSCOPY (EGD);  Surgeon: Rosalie Kitchens, MD;  Location: [facility];  Service: Gastroenterology;  Laterality: N/A;  botox    ESOPHAGOGASTRODUODENOSCOPY (EGD) WITH PROPOFOL  N/A 08/28/2022   Procedure: ESOPHAGOGASTRODUODENOSCOPY (EGD) WITH PROPOFOL ;  Surgeon: Elicia Claw, MD;  Location: [facility];  Service: Gastroenterology;  Laterality: N/A;   ESOPHAGOGASTRODUODENOSCOPY (EGD) WITH PROPOFOL  Bilateral 05/27/2023   Procedure: ESOPHAGOGASTRODUODENOSCOPY (EGD) WITH PROPOFOL ;  Surgeon: Rosalie Kitchens, MD;  Location: [facility];  Service: Gastroenterology;  Laterality: Bilateral;   FOOT SURGERY     Groin Abscess     HAND SURGERY     KNEE SURGERY Bilateral    LABIAL ABSCESS     LUMBAR LAMINECTOMY/DECOMPRESSION MICRODISCECTOMY Left 05/11/2018   Procedure: LUMBAR LAMINECTOMY/DECOMPRESSION MICRODISCECTOMY 1 LEVEL- L4-5;  Surgeon: Bluford Standing, MD;  Location: [facility];  Service: Neurosurgery;  Laterality: Left;   LUMBAR LAMINECTOMY/DECOMPRESSION MICRODISCECTOMY Left 05/20/2022   Procedure: MICRODISCECTOMY L3-4;  Surgeon: Mavis Purchase, MD;  Location: [facility] OR;  Service: Neurosurgery;  Laterality: Left;  3C   LUMBAR WOUND DEBRIDEMENT N/A 08/08/2022   Procedure: INCISION AND DRAINAGE OF LUMBAR WOUND;  Surgeon: Mavis Purchase, MD;  Location: [facility] OR;  Service: Neurosurgery;  Laterality: N/A;  3C   OOPHORECTOMY     BSO   PUBO VAG SLING     SHOULDER SURGERY     bilateral arthroscopies   TEE WITHOUT CARDIOVERSION N/A 07/25/2023   Procedure: TRANSESOPHAGEAL ECHOCARDIOGRAM (TEE);  Surgeon: Hilarie Rocher, MD;  Location: [facility];  Service: Cardiovascular;  Laterality: N/A;   TEE WITHOUT CARDIOVERSION N/A 12/08/2023   Procedure: ECHOCARDIOGRAM, TRANSESOPHAGEAL;  Surgeon: Wilburn Keller BROCKS, MD;  Location: [facility];  Service: Cardiovascular;  Laterality: N/A;  TEE   VAGINAL HYSTERECTOMY  1979    Medications Prior to  Admission  Medication Sig Dispense Refill Last Dose/Taking   acetaminophen  (TYLENOL ) 500 MG tablet Take 1,000 mg by mouth every 6 (six) hours as needed for moderate pain.   Unknown   albuterol  (VENTOLIN  HFA) 108 (90 Base) MCG/ACT inhaler Inhale 2 puffs into the lungs every 6 (six) hours as needed for wheezing or shortness of breath.   Unknown   aspirin  EC 81 MG tablet Take 81 mg by mouth at bedtime. Swallow whole.   Taking   calcitRIOL  (ROCALTROL ) 0.25 MCG capsule Take 0.25 mcg by mouth at bedtime.   Taking   cholecalciferol (VITAMIN D3) 25 MCG (1000 UNIT) tablet Take 1,000 Units by mouth daily.   Taking   cyclobenzaprine  (FLEXERIL ) 10 MG tablet Take 1 tablet (10 mg total) by mouth 3 (three) times daily as needed for muscle spasms. 30 tablet 0 Taking As Needed   D-MANNOSE PO Take 2 tablets by mouth at bedtime.   Taking   diazepam  (VALIUM ) 5 MG tablet Take 5 mg by mouth 2 (two) times daily as needed.   Taking As Needed   diphenhydrAMINE (BENADRYL) 25 MG tablet Take 50 mg by mouth at bedtime as needed for sleep.   Taking As Needed   docusate sodium  (COLACE) 100 MG capsule Take  1 capsule (100 mg total) by mouth 2 (two) times daily. (Patient taking differently: Take 100 mg by mouth at bedtime as needed for mild constipation or moderate constipation.) 30 capsule 0 Taking Differently   estradiol  (ESTRACE ) 0.1 MG/GM vaginal cream Place 1 Applicatorful vaginally 3 (three) times a week.   Taking   Fe Fum-Vit C-Vit B12-FA (TRIGELS-F FORTE) CAPS capsule Take 1 capsule by mouth daily after breakfast. 90 capsule 0 Taking   insulin  glargine-yfgn (SEMGLEE ) 100 UNIT/ML Pen Inject 10 Units into the skin 2 (two) times daily.   Taking   losartan  (COZAAR ) 100 MG tablet Take 1 tablet (100 mg total) by mouth daily. (Patient taking differently: Take 100 mg by mouth at bedtime.) 30 tablet 2 Taking Differently   metoprolol  succinate (TOPROL -XL) 100 MG 24 hr tablet Take 100 mg by mouth at bedtime.   Taking   Multiple  Vitamins-Minerals (PRESERVISION AREDS 2+MULTI VIT PO) Take 1 capsule by mouth daily.   Taking   nitrofurantoin  (MACRODANTIN ) 100 MG capsule Take 100 mg by mouth at bedtime.   Taking   rosuvastatin  (CRESTOR ) 5 MG tablet Take 5 mg by mouth daily.   Taking   sennosides-docusate sodium  (SENOKOT-S) 8.6-50 MG tablet Take 1 tablet by mouth daily as needed for constipation.   Taking As Needed   sodium bicarbonate  650 MG tablet Take 1 tablet (650 mg total) by mouth 2 (two) times daily. (Patient taking differently: Take 650 mg by mouth daily.) 100 tablet 1 Taking Differently   sucralfate  (CARAFATE ) 1 g tablet Take 1 tablet (1 g total) by mouth 2 (two) times daily as needed (acid reflux).   Taking As Needed   Vibegron (GEMTESA) 75 MG TABS Take 75 mg by mouth at bedtime.   Taking   zolpidem  (AMBIEN ) 10 MG tablet Take 10 mg by mouth at bedtime as needed for sleep.   Taking As Needed   amLODipine  (NORVASC ) 10 MG tablet Take 1 tablet (10 mg total) by mouth daily. (Patient not taking: Reported on 11/28/2023) 30 tablet 2 Not Taking   BD INSULIN  SYRINGE U/F 31G X 5/16 1 ML MISC USE 1 SYRINGE AS DIRECTED      Continuous Glucose Sensor (FREESTYLE LIBRE 2 SENSOR) MISC       epoetin  alfa-epbx (RETACRIT ) 4000 UNIT/ML injection Inject 1 mL (4,000 Units total) into the skin once a week. (Patient not taking: Reported on 11/28/2023) 4 mL 3 Not Taking   feeding supplement (BOOST HIGH PROTEIN) LIQD Take 1 Container by mouth 2 (two) times daily between meals.      insulin  detemir (LEVEMIR ) 100 UNIT/ML injection Inject 100-120 Units into the skin daily. (Patient not taking: Reported on 11/28/2023)   Not Taking   nystatin  (MYCOSTATIN ) 100000 UNIT/ML suspension Take 5 mLs (500,000 Units total) by mouth 4 (four) times daily. (Patient not taking: Reported on 11/28/2023) 120 mL 1 Not Taking   ONETOUCH ULTRA test strip 4 (four) times daily.      Social History   Socioeconomic History   Marital status: Married    Spouse name: Not on  file   Number of children: 1   Years of education: Not on file   Highest education level: Not on file  Occupational History   Not on file  Tobacco Use   Smoking status: Every Day    Current packs/day: 1.50    Average packs/day: 1.5 packs/day for 50.0 years (75.0 ttl pk-yrs)    Types: Cigarettes   Smokeless tobacco: Never   Tobacco comments:  3ppd x 10 year, then cut back to 1.5pdd since 02/2015  Vaping Use   Vaping status: Never Used  Substance and Sexual Activity   Alcohol use: Never    Alcohol/week: 0.0 standard drinks of alcohol   Drug use: No   Sexual activity: Not Currently    Birth control/protection: Surgical    Comment: Hx Hysterectomy, 1st intercourse 78 yo-Fewer than 5 partners  Other Topics Concern   Not on file  Social History Narrative   Lives at home with husband in a one story home.  Has 1 daughter.     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     Education: some college.   Social Drivers of Corporate investment banker Strain: Low Risk  (10/31/2023)   Received from [facility]   Overall Financial Resource Strain (CARDIA)    Difficulty of Paying Living Expenses: Not hard at all  Food Insecurity: No Food Insecurity (11/28/2023)   Hunger Vital Sign    Worried About Running Out of Food in the Last Year: Never true    Ran Out of Food in the Last Year: Never true  Transportation Needs: No Transportation Needs (11/28/2023)   PRAPARE - Administrator, Civil Service (Medical): No    Lack of Transportation (Non-Medical): No  Physical Activity: Not on file  Stress: Not on file  Social Connections: Unknown (11/28/2023)   Social Connection and Isolation Panel    Frequency of Communication with Friends and Family: More than three times a week    Frequency of Social Gatherings with Friends and Family: More than three times a week    Attends Religious Services: More than 4 times per year    Active Member of [employer] or  Organizations: Not on file    Attends Banker Meetings: Not on file    Marital Status: Not on file  Intimate Partner Violence: Not At Risk (11/28/2023)   Humiliation, Afraid, Rape, and Kick questionnaire    Fear of Current or Ex-Partner: No    Emotionally Abused: No    Physically Abused: No    Sexually Abused: No    Family History  Problem Relation Age of Onset   Hypertension Mother    Heart disease Mother 40       MI   Diabetes Father    Diabetes Sister    Hypertension Sister    Diabetes Brother    Hypertension Brother    Heart disease Brother 21       CAD   Cancer Sister        Multiple myloma   Diabetes Brother      Vitals:   12/10/23 0947 12/10/23 1000 12/10/23 1036 12/10/23 1107  BP: (!) 127/45  (!) 122/44   Pulse: 72 67 67   Resp: (!) 25  15 (!) 24  Temp:  98.1 F (36.7 C) 98.1 F (36.7 C)   TempSrc:   Oral   SpO2: 96% 96% 99%   Weight:      Height:        PHYSICAL EXAM General: well appearing elderly female, well nourished, in no acute distress. HEENT: Normocephalic and atraumatic. Neck: No JVD.   Lungs: Normal respiratory effort on room air. Diminished breath sounds bilaterally, bibasilar crackles.  Heart: HRRR. Normal S1 and S2, + murmur  Abdomen: Non-distended appearing.  Msk: Normal strength and tone for age. Extremities: Warm and well perfused. No clubbing, cyanosis. Trace pedal edema bilaterally.  Neuro: Alert and oriented X 3. Psych: Answers questions appropriately.   Labs: Basic Metabolic Panel: Recent Labs    12/07/23 1219 12/09/23 0509  NA 132* 132*  K 3.5 3.8  CL 94* 95*  CO2 27 26  GLUCOSE 238* 218*  BUN 28* 41*  CREATININE 2.65* 3.78*  CALCIUM  7.7* 8.1*  PHOS 2.8  --    Liver Function Tests: Recent Labs    12/07/23 1219  ALBUMIN 1.8*    No results for input(s): LIPASE, AMYLASE in the last 72 hours. CBC: Recent Labs    12/09/23 0509 12/10/23 0326  WBC 13.8* 13.8*  HGB 7.3* 6.3*  HCT 22.8* 19.8*   MCV 93.1 92.5  PLT 209 212   Cardiac Enzymes: No results for input(s): CKTOTAL, CKMB, CKMBINDEX, TROPONINIHS in the last 72 hours. BNP: Recent Labs    12/09/23 0508  BNP 1,699.6*   D-Dimer: No results for input(s): DDIMER in the last 72 hours. Hemoglobin A1C: No results for input(s): HGBA1C in the last 72 hours. Fasting Lipid Panel: No results for input(s): CHOL, HDL, LDLCALC, TRIG, CHOLHDL, LDLDIRECT in the last 72 hours. Thyroid Function Tests: Recent Labs    12/09/23 0509  TSH 2.099   Anemia Panel: No results for input(s): VITAMINB12, FOLATE, FERRITIN, TIBC, IRON, RETICCTPCT in the last 72 hours.   Radiology: PERIPHERAL VASCULAR CATHETERIZATION Result Date: 12/10/2023 See surgical note for result.  ECHO TEE Result Date: 12/08/2023    TRANSESOPHOGEAL ECHO REPORT   Patient Name:   CLARISE CHACKO Date of Exam: 12/08/2023 Medical Rec #:  [id number]      Height:       67.0 in Accession #:    [id number]     Weight:       173.7 lb Date of Birth:  08-Dec-1945      BSA:          1.905 m Patient Age:    78 years       BP:           131/52 mmHg Patient Gender: F              HR:           69 bpm. Exam Location:  [facility] Procedure: Transesophageal Echo, Cardiac Doppler, Color Doppler and 3D Echo            (Both Spectral and Color Flow Doppler were utilized during            procedure). Indications:     Bacteremia R78.81  History:         Patient has prior history of Echocardiogram examinations, most                  recent 12/07/2023. Risk Factors:Hypertension and Sleep Apnea.  Sonographer:     Christopher Furnace Referring Phys:  [id number] DWAYNE D CALLWOOD Diagnosing Phys: Keller Paterson PROCEDURE: After discussion of the risks and benefits of a TEE, an informed consent was obtained from the patient. The transesophogeal probe was passed without difficulty through the esophogus of the patient. Local oropharyngeal anesthetic was provided with Cetacaine . Sedation performed by  different physician. The patient was monitored while under deep sedation. Image quality was excellent. The patient's vital signs; including heart rate, blood pressure, and oxygen saturation; remained stable throughout the procedure. The patient developed no complications during the procedure.  IMPRESSIONS  1. Left ventricular ejection fraction, by estimation, is 50 to 55%. The left ventricle has low normal function.  2. Right  ventricular systolic function is normal. The right ventricular size is normal.  3. No left atrial/left atrial appendage thrombus was detected.  4. The mitral valve is normal in structure. Trivial mitral valve regurgitation.  5. The aortic valve is tricuspid. There is mild thickening of the aortic valve. Aortic valve regurgitation is not visualized.  6. 3D performed of the mitral valve and demonstrates no valvular vegetation. Conclusion(s)/Recommendation(s): No evidence of vegetation/infective endocarditis on this transesophageael echocardiogram. FINDINGS  Left Ventricle: Left ventricular ejection fraction, by estimation, is 50 to 55%. The left ventricle has low normal function. The left ventricular internal cavity size was normal in size. Right Ventricle: The right ventricular size is normal. No increase in right ventricular wall thickness. Right ventricular systolic function is normal. Left Atrium: Left atrial size was normal in size. No left atrial/left atrial appendage thrombus was detected. Right Atrium: Right atrial size was normal in size. Pericardium: There is no evidence of pericardial effusion. Mitral Valve: The mitral valve is normal in structure. Trivial mitral valve regurgitation. Tricuspid Valve: The tricuspid valve is normal in structure. Tricuspid valve regurgitation is trivial. Aortic Valve: The aortic valve is tricuspid. There is mild thickening of the aortic valve. Aortic valve regurgitation is not visualized. Pulmonic Valve: The pulmonic valve was normal in structure.  Pulmonic valve regurgitation is trivial. Aorta: The aortic root is normal in size and structure. IAS/Shunts: No atrial level shunt detected by color flow Doppler. Additional Comments: 3D was performed not requiring image post processing on an independent workstation and was normal. Keller Paterson Electronically signed by Keller Paterson Signature Date/Time: 12/08/2023/5:20:23 PM    Final    ECHOCARDIOGRAM LIMITED Result Date: 12/07/2023    ECHOCARDIOGRAM LIMITED REPORT   Patient Name:   BETRICE WANAT Date of Exam: 12/07/2023 Medical Rec #:  [id number]      Height:       67.0 in Accession #:    [id number]     Weight:       171.1 lb Date of Birth:  01-Apr-1946      BSA:          1.892 m Patient Age:    78 years       BP:           147/56 mmHg Patient Gender: F              HR:           62 bpm. Exam Location:  [facility] Procedure: Limited Echo (Both Spectral and Color Flow Doppler were utilized            during procedure). Indications:     Bacteremia R78.81  History:         Patient has prior history of Echocardiogram examinations, most                  recent 11/29/2023.  Sonographer:     Thedora Louder RDCS, FASE Referring Phys:  [phone] DWAYNE D CALLWOOD Diagnosing Phys: Cara JONETTA Lovelace MD  Sonographer Comments: Limited 2D echo requested no spectral or color flow doppler performed on this exam. IMPRESSIONS  1. Aotic valve valve slightly thickened.  2. Mitral valve wth mild thickening.  3. No obvious vegataion seen.  4. Recommed TEE.  5. Left ventricular ejection fraction, by estimation, is 60 to 65%. The left ventricle has normal function. The left ventricle has no regional wall motion abnormalities. Left ventricular diastolic function could not be evaluated.  6. Right ventricular systolic function is  normal. The right ventricular size is normal.  7. The mitral valve is normal in structure.  8. The aortic valve is grossly normal. Aortic valve sclerosis is present, with no evidence of aortic valve stenosis.  Conclusion(s)/Recommendation(s): Poor windows for evaluation of left ventricular function by transthoracic echocardiography. Would recommend an alternative means of evaluation. No evidence of valvular vegetations on this transthoracic echocardiogram. Consider a transesophageal echocardiogram to exclude infective endocarditis if clinically indicated. FINDINGS  Left Ventricle: Left ventricular ejection fraction, by estimation, is 60 to 65%. The left ventricle has normal function. The left ventricle has no regional wall motion abnormalities. The left ventricular internal cavity size was normal in size. There is  no left ventricular hypertrophy. Left ventricular diastolic function could not be evaluated. Right Ventricle: The right ventricular size is normal. No increase in right ventricular wall thickness. Right ventricular systolic function is normal. Left Atrium: Left atrial size was normal in size. Right Atrium: Right atrial size was normal in size. Pericardium: There is no evidence of pericardial effusion. Mitral Valve: The mitral valve is normal in structure. There is mild thickening of the mitral valve leaflet(s). There is mild calcification of the mitral valve leaflet(s). Normal mobility of the mitral valve leaflets. Tricuspid Valve: The tricuspid valve is normal in structure. Aortic Valve: The aortic valve is grossly normal. Aortic valve sclerosis is present, with no evidence of aortic valve stenosis. Pulmonic Valve: The pulmonic valve was normal in structure. Aorta: The ascending aorta was not well visualized. Additional Comments: No obvious vegataion seen. Aotic valve valve slightly thickened. Mitral valve wth mild thickening. Recommed TEE.  Dwayne D Callwood MD Electronically signed by Cara JONETTA Lovelace MD Signature Date/Time: 12/07/2023/1:28:01 PM    Final    DG Chest Port 1 View Result Date: 12/04/2023 CLINICAL DATA:  Fever EXAM: PORTABLE CHEST 1 VIEW COMPARISON:  11/28/2023 FINDINGS: Right internal  jugular hemodialysis catheter tips are seen within the superior vena cava. Lungs appear mildly hyperinflated, stable since prior examination, suggesting changes of underlying COPD. Small right pleural effusion again noted, slightly decreased since prior examination. Focal atelectasis or infiltrate within the right costophrenic angle. Mild diffuse interstitial thickening is again noted in keeping with interstitial pulmonary edema or airway inflammation. No pneumothorax. Stable cardiomegaly. No acute bone abnormality. IMPRESSION: 1. Stable cardiomegaly. 2. Small right pleural effusion, slightly decreased since prior examination. 3. Stable interstitial thickening in keeping with interstitial pulmonary edema or airway inflammation. Electronically Signed   By: Dorethia Molt M.D.   On: 12/04/2023 20:43   PERIPHERAL VASCULAR CATHETERIZATION Result Date: 12/01/2023 See surgical note for result.  ECHOCARDIOGRAM COMPLETE Result Date: 11/29/2023    ECHOCARDIOGRAM REPORT   Patient Name:   LEETA GRIMME Date of Exam: 11/29/2023 Medical Rec #:  [id number]      Height:       67.0 in Accession #:    [id number]     Weight:       176.6 lb Date of Birth:  [birth year]      BSA:          1.918 m Patient Age:    78 years       BP:           163/51 mmHg Patient Gender: F              HR:           81 bpm. Exam Location:  [facility] Procedure: 2D Echo, Cardiac Doppler and Color Doppler (Both Spectral and Color  Flow Doppler were utilized during procedure). Indications:     CHF I50.31  History:         Patient has prior history of Echocardiogram examinations, most                  recent 07/23/2023.  Sonographer:     Thedora Louder RDCS, FASE Referring Phys:  [id number] ANTHONY CHRISTELLA POUCH Diagnosing Phys: Dwayne D Callwood MD IMPRESSIONS  1. Left ventricular ejection fraction, by estimation, is 55 to 60%. The left ventricle has normal function. The left ventricle has no regional wall motion abnormalities. The left ventricular internal  cavity size was moderately dilated. Left ventricular diastolic parameters are consistent with Grade II diastolic dysfunction (pseudonormalization).  2. Right ventricular systolic function is normal. The right ventricular size is normal.  3. Left atrial size was mildly dilated.  4. The mitral valve is normal in structure. Trivial mitral valve regurgitation.  5. The aortic valve is normal in structure. Aortic valve regurgitation is not visualized. FINDINGS  Left Ventricle: Left ventricular ejection fraction, by estimation, is 55 to 60%. The left ventricle has normal function. The left ventricle has no regional wall motion abnormalities. Strain was performed and the global longitudinal strain is indeterminate. The left ventricular internal cavity size was moderately dilated. There is no left ventricular hypertrophy. Left ventricular diastolic parameters are consistent with Grade II diastolic dysfunction (pseudonormalization). Right Ventricle: The right ventricular size is normal. No increase in right ventricular wall thickness. Right ventricular systolic function is normal. Left Atrium: Left atrial size was mildly dilated. Right Atrium: Right atrial size was normal in size. Pericardium: There is no evidence of pericardial effusion. Mitral Valve: The mitral valve is normal in structure. Trivial mitral valve regurgitation. Tricuspid Valve: The tricuspid valve is normal in structure. Tricuspid valve regurgitation is mild. Aortic Valve: The aortic valve is normal in structure. Aortic valve regurgitation is not visualized. Aortic valve peak gradient measures 8.8 mmHg. Pulmonic Valve: The pulmonic valve was normal in structure. Pulmonic valve regurgitation is not visualized. Aorta: The ascending aorta was not well visualized. IAS/Shunts: No atrial level shunt detected by color flow Doppler. Additional Comments: 3D was performed not requiring image post processing on an independent workstation and was indeterminate.  LEFT  VENTRICLE PLAX 2D LVIDd:         5.50 cm     Diastology LVIDs:         3.80 cm     LV e' medial:    7.18 cm/s LV PW:         1.00 cm     LV E/e' medial:  19.9 LV IVS:        1.10 cm     LV e' lateral:   8.38 cm/s LVOT diam:     1.80 cm     LV E/e' lateral: 17.1 LV SV:         63 LV SV Index:   33 LVOT Area:     2.54 cm  LV Volumes (MOD) LV vol d, MOD A2C: 74.1 ml LV vol d, MOD A4C: 70.9 ml LV vol s, MOD A2C: 37.0 ml LV vol s, MOD A4C: 28.8 ml LV SV MOD A2C:     37.1 ml LV SV MOD A4C:     70.9 ml LV SV MOD BP:      41.0 ml RIGHT VENTRICLE RV Basal diam:  3.20 cm RV S prime:     14.80 cm/s TAPSE (M-mode): 2.6 cm LEFT ATRIUM  Index        RIGHT ATRIUM           Index LA diam:        4.30 cm 2.24 cm/m   RA Area:     19.70 cm LA Vol (A2C):   44.9 ml 23.41 ml/m  RA Volume:   55.40 ml  28.88 ml/m LA Vol (A4C):   53.8 ml 28.05 ml/m LA Biplane Vol: 51.9 ml 27.06 ml/m  AORTIC VALVE                 PULMONIC VALVE AV Area (Vmax): 1.84 cm     PV Vmax:        1.01 m/s AV Vmax:        148.00 cm/s  PV Peak grad:   4.1 mmHg AV Peak Grad:   8.8 mmHg     RVOT Peak grad: 3 mmHg LVOT Vmax:      107.00 cm/s LVOT Vmean:     76.000 cm/s LVOT VTI:       0.249 m  AORTA Ao Root diam: 2.90 cm MITRAL VALVE MV Area (PHT): 4.86 cm     SHUNTS MV Decel Time: 156 msec     Systemic VTI:  0.25 m MV E velocity: 143.00 cm/s  Systemic Diam: 1.80 cm MV A velocity: 80.30 cm/s MV E/A ratio:  1.78 Dwayne D Callwood MD Electronically signed by Cara JONETTA Lovelace MD Signature Date/Time: 11/29/2023/10:33:04 AM    Final    US  Venous Img Lower Bilateral Result Date: 11/28/2023 CLINICAL DATA:  New onset left greater than right lower extremity swelling, without findings of CHF. EXAM: BILATERAL LOWER EXTREMITY VENOUS DOPPLER ULTRASOUND TECHNIQUE: Gray-scale sonography with graded compression, as well as color Doppler and duplex ultrasound were performed to evaluate the lower extremity deep venous systems from the level of the common femoral vein  and including the common femoral, femoral, profunda femoral, popliteal and calf veins including the posterior tibial, peroneal and gastrocnemius veins when visible. The superficial great saphenous vein was also interrogated. Spectral Doppler was utilized to evaluate flow at rest and with distal augmentation maneuvers in the common femoral, femoral and popliteal veins. COMPARISON:  Left lower extremity DVT exam 08/26/2019. FINDINGS: RIGHT LOWER EXTREMITY Common Femoral Vein: No evidence of thrombus. Normal compressibility, respiratory phasicity and response to augmentation. Saphenofemoral Junction: No evidence of thrombus. Normal compressibility and flow on color Doppler imaging. Profunda Femoral Vein: No evidence of thrombus. Normal compressibility and flow on color Doppler imaging. Femoral Vein: No evidence of thrombus. Normal compressibility, respiratory phasicity and response to augmentation. Popliteal Vein: No evidence of thrombus. Normal compressibility, respiratory phasicity and response to augmentation. Calf Veins: No evidence of thrombus. Normal compressibility and flow on color Doppler imaging. Superficial Great Saphenous Vein: No evidence of thrombus. Normal compressibility. Venous Reflux:  None. Other Findings:  Pulsatile venous waveforms. LEFT LOWER EXTREMITY Common Femoral Vein: No evidence of thrombus. Normal compressibility, respiratory phasicity and response to augmentation. Saphenofemoral Junction: No evidence of thrombus. Normal compressibility and flow on color Doppler imaging. Profunda Femoral Vein: No evidence of thrombus. Normal compressibility and flow on color Doppler imaging. Femoral Vein: No evidence of thrombus. Normal compressibility, respiratory phasicity and response to augmentation. Popliteal Vein: No evidence of thrombus. Normal compressibility, respiratory phasicity and response to augmentation. Calf Veins: No evidence of thrombus. Normal compressibility and flow on color Doppler  imaging. Superficial Great Saphenous Vein: No evidence of thrombus. Normal compressibility. Venous Reflux:  None. Other Findings:  Pulsatile venous  waveforms. IMPRESSION: 1. No evidence of deep venous thrombosis in either lower extremity. 2. Pulsatile venous waveforms, which can be seen with right heart failure or tricuspid regurgitation. Electronically Signed   By: Francis Quam M.D.   On: 11/28/2023 04:56   DG Chest Portable 1 View Result Date: 11/28/2023 CLINICAL DATA:  Difficulty breathing EXAM: PORTABLE CHEST 1 VIEW COMPARISON:  07/19/2023 FINDINGS: Cardiac shadow is mildly enlarged. Aortic calcifications are seen. Mild vascular congestion is noted. Small right-sided pleural effusion is noted. Metallic densities are noted symmetrically over the upper chest likely artifactual in nature. IMPRESSION: Small right pleural effusion and vascular congestion. Electronically Signed   By: Oneil Devonshire M.D.   On: 11/28/2023 02:59    ECHO as above  TELEMETRY reviewed by me 12/10/2023: sinus rhythm, rate 70s   EKG reviewed by me: Normal sinus rhythm, mild right axis nonspecific ST-T wave changes   Data reviewed by me 12/10/2023: last 24h vitals tele labs imaging I/O hospitalist progress note, nephrology notes.  Principal Problem:   Hypertensive urgency/emergency Active Problems:   Hyponatremia   CAD (coronary artery disease)   COPD with acute exacerbation (HCC)   Acute renal failure superimposed on stage 4 chronic kidney disease (HCC)   Chest pain   Hypertensive emergency   Acute dyspnea   Encounter for dialysis catheter care (HCC)   Paroxysmal atrial fibrillation (HCC)    ASSESSMENT AND PLAN:  Mckenzie Lawrence is a 78 y.o. female  with a past medical history of coronary artery disease, hypertension, hyperlipidemia, COPD, OSA, hx CVA, CKD stage 4, SIADH, diabetes mellitus who presented to the ED on 11/28/2023 for worsening and persistent SOB and lower extremity swelling with concurrent COPD  exacerbation and found elevated BNP of 2500. Cardiology was consulted for further evaluation.   # New AF RVR Patient developed AF RVR 140s early this morning (06/23). Per tele s/p IV dilt 10 mg HF improved to 110s-120s. Patient converted to SR with IV dilt gtt on 06/23. Per tele remains in SR rate 70s. (No more evidence of AF per tele) -Continue diltiazem to 120 mg daily. -Continue Eliquis 5 mg BID for stroke risk reduction. -CHA2DS2VASc score of 9  (in the setting of age, sex, CHF, HTN, Stroke history, vascular disease, and diabetes history).   # MRSA bacteremia Blood cultures positive for MRSA. TEE (06/23) revealed no evidence of vegetation/infective Endocarditis.Repeat blood cultures with no growth in 2 days. -IV broad spectrum antibiotics per primary team.  -Infectious disease following, appreciate recommendations.  # ESRD (Plan to start dialysis on 06/16) Permacath placement on 06/17 by Dr. Marea. Patient underwent dialysis, tolerated well. Permacath removed due to positive MRSA blood cultures.  -Plan to replace permacath today. -Nephrology following, appreciate recommendations.   # Acute HFpEF exacerbation # COPD exacerbation  # OSA Patient presents with worsening SOB and LE swelling. BNP elevated at 2450. Echo this admission with pEF, no RWMA, grade II diastolic dysfunction.  -Continue metoprolol  succinate 100 mg daily.  -Continue losartan  as stated below.  -Fluid removal via dialysis.  -GDMT optimization limited due to ESRD.  -Recommend sleep study for OSA if CPAP indicated.  -Recommend breathing treatments and frequent use of incentive spirometry for COPD. Management per primary. -Recommend pulmonology consultation.   # Coronary artery disease # Hypertension # Hyperlipidemia # Demand ischemia Patient without chest pain. Trops minimally elevated and flat 19 > 20. EKG without acute ischemic changes. Echo with no RWMA.   -Continue ASA 81 mg, rosuvastatin  5 mg daily -  Continue  losartan  100 mg daily. -Continue clonidine  0.2 mg BID.  -Continue metoprolol  as stated above. -Minimally elevated and flat trop sin setting of ESRD, heart failure exacerbation is most consistent with demand/supply mismatch and not ACS.  No further cardiac recommendations at this time.  Will arrange for follow up in clinic with Dr. Ammon in 1-2 weeks.   This patient's plan of care was discussed and created with Dr. Florencio and he is in agreement.  Signed: Dorene Comfort, PA-C  12/10/2023, 11:21 AM [facility] Card[facility]

## 2023-12-10 NOTE — Progress Notes (Signed)
 INFECTIOUS DISEASE PROGRESS NOTE Date of Admission:  11/28/2023     ID: Mckenzie Lawrence is a 78 y.o. female with  MRSA bacteremia Principal Problem:   Hypertensive urgency/emergency Active Problems:   Hyponatremia   CAD (coronary artery disease)   COPD with acute exacerbation (HCC)   Acute renal failure superimposed on stage 4 chronic kidney disease (HCC)   MRSA (methicillin resistant Staphylococcus aureus) septicemia (HCC)   Chest pain   Hypertensive emergency   Acute dyspnea   Encounter for dialysis catheter care (HCC)   Paroxysmal atrial fibrillation (HCC)   ESRD (end stage renal disease) (HCC)   Acute on chronic diastolic CHF (congestive heart failure) (HCC)   Subjective: HD cath placed. No fevers  ROS  Eleven systems are reviewed and negative except per hpi  Medications:  Antibiotics Given (last 72 hours)     Date/Time Action Medication Dose Rate   12/08/23 1610 New Bag/Given   vancomycin  (VANCOREADY) IVPB 1500 mg/300 mL 1,500 mg 150 mL/hr   12/10/23 0900 Given   ceFAZolin  (ANCEF ) powder 1 g        sodium chloride    Intravenous Once   apixaban   5 mg Oral BID   aspirin  EC  81 mg Oral QHS   Chlorhexidine  Gluconate Cloth  6 each Topical Q0600   cloNIDine   0.2 mg Oral BID   diltiazem   120 mg Oral Daily   epoetin  alfa-epbx (RETACRIT ) injection  4,000 Units Intravenous Q T,Th,Sat-1800   insulin  aspart  0-5 Units Subcutaneous QHS   insulin  aspart  0-9 Units Subcutaneous TID WC   insulin  aspart  3 Units Subcutaneous TID WC   insulin  glargine-yfgn  10 Units Subcutaneous QHS   lactose free nutrition  237 mL Oral TID WC   lactulose   30 g Oral Once   lidocaine   10 mL Intradermal Once   losartan   100 mg Oral QHS   metoprolol  succinate  100 mg Oral QHS   multivitamin  1 tablet Oral QHS   polyethylene glycol  17 g Oral Daily   vancomycin  variable dose per unstable renal function (pharmacist dosing)   Does not apply See admin instructions    Objective: Vital signs in  last 24 hours: Temp:  [97.7 F (36.5 C)-98.8 F (37.1 C)] 98.8 F (37.1 C) (06/25 1239) Pulse Rate:  [0-76] 66 (06/25 1430) Resp:  [15-27] 26 (06/25 1430) BP: (115-151)/(43-93) 129/93 (06/25 1430) SpO2:  [94 %-100 %] 100 % (06/25 1430) Weight:  [72.3 kg-81.1 kg] 72.3 kg (06/25 1243) Constitutional:  frail, alert and interactive HENT: [place]/AT, PERRLA, no scleral icterus Mouth/Throat: Oropharynx is clear and moist. No oropharyngeal exudate.  Cardiovascular: Normal rate, regular rhythm and normal heart sounds.  Pulmonary/Chest: Effort normal and breath sounds normal. No respiratory distress.  has no wheezes.  R chest wall prior HD cath site covered. Non tender L chest wall HD cath in place Neck = supple, no nuchal rigidity Abdominal: Soft. Bowel sounds are normal.  exhibits no distension. There is no tenderness.  Lymphadenopathy: no cervical adenopathy. No axillary adenopathy Neurological: alert and oriented to person, place, and time.  Skin: Skin is warm and dry. No rash noted. No erythema.  Psychiatric: a normal mood and affect.  behavior is normal.      Lab Results Recent Labs    12/09/23 0509 12/10/23 0326 12/10/23 1400  WBC 13.8* 13.8*  --   HGB 7.3* 6.3*  --   HCT 22.8* 19.8*  --   NA 132*  --  127*  K 3.8  --  3.6  CL 95*  --  91*  CO2 26  --  24  BUN 41*  --  51*  CREATININE 3.78*  --  4.46*    Microbiology: Results for orders placed or performed during the hospital encounter of 11/28/23  Culture, blood (Routine X 2) w Reflex to ID Panel     Status: Abnormal (Preliminary result)   Collection Time: 12/04/23  6:34 PM   Specimen: BLOOD  Result Value Ref Range Status   Specimen Description   Final    BLOOD BLOOD RIGHT ARM Performed at [facility], [address], [place], [place] [address]    Special Requests   Final    BOTTLES DRAWN AEROBIC AND ANAEROBIC Blood Culture adequate volume Performed at [facility], [address]  [place], [place] [address]    Culture  Setup Time   Final    Organism ID to follow IN BOTH AEROBIC AND ANAEROBIC BOTTLES GRAM POSITIVE COCCI CRITICAL RESULT CALLED TO, READ BACK BY AND VERIFIED WITH: SELINDA SIMPERS 12/06/2023 AT 9351 SRR Performed at [facility], [address] [place], [place] [address]    Culture (A)  Final    METHICILLIN RESISTANT STAPHYLOCOCCUS AUREUS Sent to [facility] for further susceptibility testing. Performed at [facility], [address], [place], [place] [address]    Report Status PENDING  Incomplete   Organism ID, Bacteria METHICILLIN RESISTANT STAPHYLOCOCCUS AUREUS  Final      Susceptibility   Methicillin resistant staphylococcus aureus - MIC*    CIPROFLOXACIN  >=8 RESISTANT Resistant     ERYTHROMYCIN  >=8 RESISTANT Resistant     GENTAMICIN  <=0.5 SENSITIVE Sensitive     OXACILLIN >=4 RESISTANT Resistant     TETRACYCLINE <=1 SENSITIVE Sensitive     VANCOMYCIN  2 SENSITIVE Sensitive     TRIMETH/SULFA <=10 SENSITIVE Sensitive     CLINDAMYCIN <=0.25 SENSITIVE Sensitive     RIFAMPIN <=0.5 SENSITIVE Sensitive     Inducible Clindamycin NEGATIVE Sensitive     LINEZOLID  2 SENSITIVE Sensitive     * METHICILLIN RESISTANT STAPHYLOCOCCUS AUREUS  Culture, blood (Routine X 2) w Reflex to ID Panel     Status: Abnormal   Collection Time: 12/04/23  6:34 PM   Specimen: BLOOD  Result Value Ref Range Status   Specimen Description   Final    BLOOD BLOOD LEFT ARM Performed at [facility], [address], [place], [place] [address]    Special Requests   Final    BOTTLES DRAWN AEROBIC AND ANAEROBIC Blood Culture adequate volume Performed at [facility], [address] [place], [place] [address]    Culture  Setup Time   Final    GRAM POSITIVE COCCI IN BOTH AEROBIC AND ANAEROBIC BOTTLES CRITICAL VALUE NOTED.  VALUE IS CONSISTENT WITH PREVIOUSLY REPORTED AND CALLED VALUE. Performed at [facility], [address]  [place], [place] [address]    Culture (A)  Final    STAPHYLOCOCCUS AUREUS SUSCEPTIBILITIES PERFORMED ON PREVIOUS CULTURE WITHIN THE LAST 5 DAYS. Performed at [facility], [address], [place], [place] [address]    Report Status 12/08/2023 FINAL  Final  Blood Culture ID Panel (Reflexed)     Status: Abnormal   Collection Time: 12/04/23  6:34 PM  Result Value Ref Range Status   Enterococcus faecalis NOT DETECTED NOT DETECTED Final   Enterococcus Faecium NOT DETECTED NOT DETECTED Final   Listeria monocytogenes NOT DETECTED  NOT DETECTED Final   Staphylococcus species DETECTED (A) NOT DETECTED Final    Comment: CRITICAL RESULT CALLED TO, READ BACK BY AND VERIFIED WITH: JASON ROBBINS 12/06/2023 AT 9351 SRR    Staphylococcus aureus (BCID) DETECTED (A) NOT DETECTED Final    Comment: Methicillin (oxacillin)-resistant Staphylococcus aureus (MRSA). MRSA is predictably resistant to beta-lactam antibiotics (except ceftaroline). Preferred therapy is vancomycin  unless clinically contraindicated. Patient requires contact precautions if  hospitalized. CRITICAL RESULT CALLED TO, READ BACK BY AND VERIFIED WITH: JASON ROBBINS 12/06/2023 AT 0648 SRR    Staphylococcus epidermidis NOT DETECTED NOT DETECTED Final   Staphylococcus lugdunensis NOT DETECTED NOT DETECTED Final   Streptococcus species NOT DETECTED NOT DETECTED Final   Streptococcus agalactiae NOT DETECTED NOT DETECTED Final   Streptococcus pneumoniae NOT DETECTED NOT DETECTED Final   Streptococcus pyogenes NOT DETECTED NOT DETECTED Final   A.calcoaceticus-baumannii NOT DETECTED NOT DETECTED Final   Bacteroides fragilis NOT DETECTED NOT DETECTED Final   Enterobacterales NOT DETECTED NOT DETECTED Final   Enterobacter cloacae complex NOT DETECTED NOT DETECTED Final   Escherichia coli NOT DETECTED NOT DETECTED Final   Klebsiella aerogenes NOT DETECTED NOT DETECTED Final   Klebsiella oxytoca NOT DETECTED NOT DETECTED Final   Klebsiella  pneumoniae NOT DETECTED NOT DETECTED Final   Proteus species NOT DETECTED NOT DETECTED Final   Salmonella species NOT DETECTED NOT DETECTED Final   Serratia marcescens NOT DETECTED NOT DETECTED Final   Haemophilus influenzae NOT DETECTED NOT DETECTED Final   Neisseria meningitidis NOT DETECTED NOT DETECTED Final   Pseudomonas aeruginosa NOT DETECTED NOT DETECTED Final   Stenotrophomonas maltophilia NOT DETECTED NOT DETECTED Final   Candida albicans NOT DETECTED NOT DETECTED Final   Candida auris NOT DETECTED NOT DETECTED Final   Candida glabrata NOT DETECTED NOT DETECTED Final   Candida krusei NOT DETECTED NOT DETECTED Final   Candida parapsilosis NOT DETECTED NOT DETECTED Final   Candida tropicalis NOT DETECTED NOT DETECTED Final   Cryptococcus neoformans/gattii NOT DETECTED NOT DETECTED Final   Meth resistant mecA/C and MREJ DETECTED (A) NOT DETECTED Final    Comment: CRITICAL RESULT CALLED TO, READ BACK BY AND VERIFIED WITH: JASON ROBBINS 12/06/2023 AT 9351 SRR Performed at [facility], [address] [place], [place] [address]   SARS Coronavirus 2 by RT PCR (hospital order, performed in [facility] hospital lab) *cepheid single result test* Anterior Nasal Swab     Status: None   Collection Time: 12/04/23  8:40 PM   Specimen: Anterior Nasal Swab  Result Value Ref Range Status   SARS Coronavirus 2 by RT PCR NEGATIVE NEGATIVE Final    Comment: (NOTE) SARS-CoV-2 target nucleic acids are NOT DETECTED.  The SARS-CoV-2 RNA is generally detectable in upper and lower respiratory specimens during the acute phase of infection. The lowest concentration of SARS-CoV-2 viral copies this assay can detect is 250 copies / mL. A negative result does not preclude SARS-CoV-2 infection and should not be used as the sole basis for treatment or other patient management decisions.  A negative result may occur with improper specimen collection / handling, submission of specimen other than  nasopharyngeal swab, presence of viral mutation(s) within the areas targeted by this assay, and inadequate number of viral copies (<250 copies / mL). A negative result must be combined with clinical observations, patient history, and epidemiological information.  Fact Sheet for Patients:   RoadLapTop.co.za  Fact Sheet for Healthcare Providers: http://kim-miller.com/  This test is not yet approved or  cleared by  the [place]  FDA and has been authorized for detection and/or diagnosis of SARS-CoV-2 by FDA under an Emergency Use Authorization (EUA).  This EUA will remain in effect (meaning this test can be used) for the duration of the COVID-19 declaration under Section 564(b)(1) of the Act, 21 U.S.C. section 360bbb-3(b)(1), unless the authorization is terminated or revoked sooner.  Performed at [facility], [address], [place], [place] [address]   Urine Culture (for pregnant, neutropenic or urologic patients or patients with an indwelling urinary catheter)     Status: Abnormal   Collection Time: 12/04/23  8:45 PM   Specimen: Urine, Clean Catch  Result Value Ref Range Status   Specimen Description   Final    URINE, CLEAN CATCH Performed at [facility], [address] [place], [place] [address]    Special Requests   Final    NONE Performed at [facility], [address] [place], [place] [address]    Culture (A)  Final    <10,000 COLONIES/mL INSIGNIFICANT GROWTH Performed at [facility], [address], [place], [place] [address]    Report Status 12/06/2023 FINAL  Final  Respiratory (~20 pathogens) panel by PCR     Status: None   Collection Time: 12/04/23 11:30 PM   Specimen: Nasopharyngeal Swab; Respiratory  Result Value Ref Range Status   Adenovirus NOT DETECTED NOT DETECTED Final   Coronavirus 229E NOT DETECTED NOT DETECTED Final    Comment: (NOTE) The Coronavirus on the  Respiratory Panel, DOES NOT test for the novel  Coronavirus (2019 nCoV)    Coronavirus HKU1 NOT DETECTED NOT DETECTED Final   Coronavirus NL63 NOT DETECTED NOT DETECTED Final   Coronavirus OC43 NOT DETECTED NOT DETECTED Final   Metapneumovirus NOT DETECTED NOT DETECTED Final   Rhinovirus / Enterovirus NOT DETECTED NOT DETECTED Final   Influenza A NOT DETECTED NOT DETECTED Final   Influenza B NOT DETECTED NOT DETECTED Final   Parainfluenza Virus 1 NOT DETECTED NOT DETECTED Final   Parainfluenza Virus 2 NOT DETECTED NOT DETECTED Final   Parainfluenza Virus 3 NOT DETECTED NOT DETECTED Final   Parainfluenza Virus 4 NOT DETECTED NOT DETECTED Final   Respiratory Syncytial Virus NOT DETECTED NOT DETECTED Final   Bordetella pertussis NOT DETECTED NOT DETECTED Final   Bordetella Parapertussis NOT DETECTED NOT DETECTED Final   Chlamydophila pneumoniae NOT DETECTED NOT DETECTED Final   Mycoplasma pneumoniae NOT DETECTED NOT DETECTED Final    Comment: Performed at [facility], [address] [place], [place] [address]  Cath Tip Culture     Status: None   Collection Time: 12/06/23  2:45 PM   Specimen: Catheter Tip; Other  Result Value Ref Range Status   Specimen Description   Final    CATH TIP Performed at [facility], [address], [place], [place] [address]    Special Requests   Final    NONE Performed at [facility], [address], [place], [place] [address]    Culture   Final    NO GROWTH 2 DAYS Performed at [facility], [address] [place], [place] [address]    Report Status 12/10/2023 FINAL  Final  MRSA Next Gen by PCR, Nasal     Status: Abnormal   Collection Time: 12/07/23  9:00 AM   Specimen: Nasal Mucosa; Nasal Swab  Result Value Ref Range Status   MRSA by PCR Next Gen DETECTED (A) NOT DETECTED Final  Comment: RESULT CALLED TO, READ BACK BY AND VERIFIED WITH: ELIZABETH HAYS 12/07/23 1041 SLM (NOTE) The GeneXpert MRSA Assay (FDA  approved for NASAL specimens only), is one component of a comprehensive MRSA colonization surveillance program. It is not intended to diagnose MRSA infection nor to guide or monitor treatment for MRSA infections. Test performance is not FDA approved in patients less than 52 years old. Performed at [facility], [address] [place], [place] [address]   Culture, blood (Routine X 2) w Reflex to ID Panel     Status: None (Preliminary result)   Collection Time: 12/07/23 12:50 PM   Specimen: BLOOD  Result Value Ref Range Status   Specimen Description BLOOD RIGHT ANTECUBITAL  Final   Special Requests   Final    BOTTLES DRAWN AEROBIC AND ANAEROBIC Blood Culture adequate volume   Culture   Final    NO GROWTH 3 DAYS Performed at [facility], [address] [place], [place] [address]    Report Status PENDING  Incomplete  Culture, blood (Routine X 2) w Reflex to ID Panel     Status: None (Preliminary result)   Collection Time: 12/07/23 12:50 PM   Specimen: BLOOD  Result Value Ref Range Status   Specimen Description BLOOD LEFT ANTECUBITAL  Final   Special Requests   Final    BOTTLES DRAWN AEROBIC AND ANAEROBIC Blood Culture adequate volume   Culture   Final    NO GROWTH 3 DAYS Performed at [facility], [address] [place], [place] [address]    Report Status PENDING  Incomplete    Studies/Results: PERIPHERAL VASCULAR CATHETERIZATION Result Date: 12/10/2023 See surgical note for result.   Assessment/Plan: 89 you female admitted with hypertensive urgency and acute on chronic kidney failure requiring placement of a dialysis catheter.  Several days into admission she had a fever and was found to have MRSA bacteremia.  Permacath was removed June 24 and she is due for repeat dialysis June 24. Clinically improving and fu cx neg from 6/21.  Her history is complicated because she had an MRSA bacteremia back in February of unknown source.  She has chronic  back issues and has had lumbar fusion.  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  Prior to this admission she had a recent MRI done at Dr. Mavis office that per report did not show any evidence of infection.   TEE negative.     Recommendations HD cath associated MRSA bacteremia-  Would plan on min 4 week treatment at this point. May need longer if concern for back infection. Unclear if bacteremia could have seeded her spine but would be too early to image again. Checking dapto sensis Can be dced on IV vanco or Dapto to be given with HD for 4 weeks from first neg BCX after removal of the HD cath  Thank you very much for the consult. Will follow with you.  Alm SHAUNNA Needle  12/10/2023, 2:41 PM

## 2023-12-10 NOTE — Hospital Course (Signed)
 Mckenzie Lawrence is a 77 y.o. female with medical history significant for COPD, CVA, stage 4 CKD, SIADH, esophageal dysphagia treated with Botox  (last in 05/2023), DM, HLD, and HTN, last hospitalized from 2/1 to 07/26/2023 with MRSA bacteremia/sepsis/multifocal pneumonia, now presenting by EMS with shortness of breath and chest pressure.   She has no orthopnea and sleeps on 1 pillow.  She has a chronic congested cough that has not changed and has no fever or chills  BP on arrival 202/74 and tachypneic to 25 saturating at 98% on room air. WBC 11,000 with hemoglobin of 9 which is her baseline. Troponin 19 and BNP 2450 Creatinine 3.59 up from baseline of 2.74 with bicarb 19.  Glucose 207, potassium 5.2, sodium 123 (137 four months ago) EKG with sinus rhythm at 79 Chest x-ray showing small right pleural effusion and vascular congestion Patient treated with DuoNeb and Solu-Medrol  and given a dose of Lasix  Patient was diagnosed with acute on chronic diastolic congestive heart failure, she also had progressed to end-stage renal disease.  She was treated with initially with IV Lasix , then hemodialysis was started on 6/17. Blood culture positive again for MRSA, started on vancomycin .

## 2023-12-10 NOTE — Progress Notes (Addendum)
 Vancomycin  not completed in HD. Unable to run on floor due to family refusal of peripheral vanc infusion. Pharmacy notified.

## 2023-12-10 NOTE — Progress Notes (Addendum)
 [facility]  ROUNDING NOTE   Subjective:  [name] is a 78 y.o.  female with past medical history of hypertension, long-standing diabetes mellitus type 2, lower extremity edema, hyperlipidemia, tobacco abuse, prior history of CVA, COPD, obstructive sleep apnea, lumbar spinal fusion, left upper lobe lung mass who presents with volume overload and increasing shortness of breath.   Update:  Patient seen post procedure, drowsy NPO for permcath insertion  Scheduled to receive dialysis later today   Objective:  Vital signs in last 24 hours:  Temp:  [97.7 F (36.5 C)-98.8 F (37.1 C)] 98.8 F (37.1 C) (06/25 1239) Pulse Rate:  [0-76] 66 (06/25 1430) Resp:  [15-27] 26 (06/25 1430) BP: (115-151)/(43-93) 129/93 (06/25 1430) SpO2:  [94 %-100 %] 100 % (06/25 1430) Weight:  [72.3 kg-81.1 kg] 72.3 kg (06/25 1243)  Weight change: 2 kg Filed Weights   12/09/23 0500 12/10/23 0424 12/10/23 1243  Weight: 79.1 kg 81.1 kg 72.3 kg    Intake/Output: I/O last 3 completed shifts: In: 325 [P.O.:200; I.V.:125] Out: 150 [Urine:150]   Intake/Output this shift:  No intake/output data recorded.  Physical Exam: General: No acute distress  Head: Normocephalic, atraumatic. Dry mucosal membranes  Eyes: Anicteric  Neck: Supple  Lungs:  Normal breathing effort  Heart: Regular rate and rhythm  Abdomen:  Soft, nontender, BS present  Extremities: No peripheral edema.  Neurologic: Nonfocal, moving all four extremities  Skin: No lesions  Dialysis Access: Right IJ PermCath placed on 6/25    Basic Metabolic Panel: Recent Labs  Lab 12/04/23 0508 12/06/23 0750 12/07/23 1219 12/09/23 0509 12/10/23 1400  NA 131* 130* 132* 132* 127*  K 4.2 3.9 3.5 3.8 3.6  CL 97* 93* 94* 95* 91*  CO2 26 26 27 26 24   GLUCOSE 116* 182* 238* 218* 238*  BUN 38* 32* 28* 41* 51*  CREATININE 2.59* 3.03* 2.65* 3.78* 4.46*  CALCIUM  8.0* 7.9* 7.7* 8.1* 7.8*  PHOS  --  4.0 2.8  --  5.4*    Liver  Function Tests: Recent Labs  Lab 12/06/23 0750 12/07/23 1219 12/10/23 1400  ALBUMIN 2.0* 1.8* 1.7*   No results for input(s): LIPASE, AMYLASE in the last 168 hours. No results for input(s): AMMONIA in the last 168 hours.  CBC: Recent Labs  Lab 12/04/23 0508 12/06/23 0750 12/07/23 1219 12/09/23 0509 12/10/23 0326  WBC 12.1* 15.6* 13.2* 13.8* 13.8*  HGB 8.6* 8.2* 7.1* 7.3* 6.3*  HCT 27.7* 25.2* 21.6* 22.8* 19.8*  MCV 93.9 94.0 93.9 93.1 92.5  PLT 228 193 150 209 212    Cardiac Enzymes: No results for input(s): CKTOTAL, CKMB, CKMBINDEX, TROPONINI in the last 168 hours.  BNP: Invalid input(s): POCBNP  CBG: Recent Labs  Lab 12/09/23 1134 12/09/23 1622 12/09/23 2013 12/10/23 0736 12/10/23 1129  GLUCAP 220* 287* 184* 150* 204*    Microbiology: Results for orders placed or performed during the hospital encounter of 11/28/23  Culture, blood (Routine X 2) w Reflex to ID Panel     Status: Abnormal (Preliminary result)   Collection Time: 12/04/23  6:34 PM   Specimen: BLOOD  Result Value Ref Range Status   Specimen Description   Final    BLOOD BLOOD RIGHT ARM Performed at [facility], [address], [place], [place] [address]    Special Requests   Final    BOTTLES DRAWN AEROBIC AND ANAEROBIC Blood Culture adequate volume Performed at [facility], [address] [place], [place] [address]  Culture  Setup Time   Final    Organism ID to follow IN BOTH AEROBIC AND ANAEROBIC BOTTLES GRAM POSITIVE COCCI CRITICAL RESULT CALLED TO, READ BACK BY AND VERIFIED WITH: JASON ROBBINS 12/06/2023 AT 9351 SRR Performed at [facility], [address] [place], [place] [address]    Culture (A)  Final    METHICILLIN RESISTANT STAPHYLOCOCCUS AUREUS Sent to [facility] for further susceptibility testing. Performed at [facility], [address], [place], [place] [address]    Report Status PENDING  Incomplete   Organism ID,  Bacteria METHICILLIN RESISTANT STAPHYLOCOCCUS AUREUS  Final      Susceptibility   Methicillin resistant staphylococcus aureus - MIC*    CIPROFLOXACIN  >=8 RESISTANT Resistant     ERYTHROMYCIN  >=8 RESISTANT Resistant     GENTAMICIN  <=0.5 SENSITIVE Sensitive     OXACILLIN >=4 RESISTANT Resistant     TETRACYCLINE <=1 SENSITIVE Sensitive     VANCOMYCIN  2 SENSITIVE Sensitive     TRIMETH/SULFA <=10 SENSITIVE Sensitive     CLINDAMYCIN <=0.25 SENSITIVE Sensitive     RIFAMPIN <=0.5 SENSITIVE Sensitive     Inducible Clindamycin NEGATIVE Sensitive     LINEZOLID  2 SENSITIVE Sensitive     * METHICILLIN RESISTANT STAPHYLOCOCCUS AUREUS  Culture, blood (Routine X 2) w Reflex to ID Panel     Status: Abnormal   Collection Time: 12/04/23  6:34 PM   Specimen: BLOOD  Result Value Ref Range Status   Specimen Description   Final    BLOOD BLOOD LEFT ARM Performed at [facility], [address], [place], [place] [address]    Special Requests   Final    BOTTLES DRAWN AEROBIC AND ANAEROBIC Blood Culture adequate volume Performed at [facility], [address] [place], [place] [address]    Culture  Setup Time   Final    GRAM POSITIVE COCCI IN BOTH AEROBIC AND ANAEROBIC BOTTLES CRITICAL VALUE NOTED.  VALUE IS CONSISTENT WITH PREVIOUSLY REPORTED AND CALLED VALUE. Performed at [facility], [address] [place], [place] [address]    Culture (A)  Final    STAPHYLOCOCCUS AUREUS SUSCEPTIBILITIES PERFORMED ON PREVIOUS CULTURE WITHIN THE LAST 5 DAYS. Performed at [facility], [address], [place], [place] [address]    Report Status 12/08/2023 FINAL  Final  Blood Culture ID Panel (Reflexed)     Status: Abnormal   Collection Time: 12/04/23  6:34 PM  Result Value Ref Range Status   Enterococcus faecalis NOT DETECTED NOT DETECTED Final   Enterococcus Faecium NOT DETECTED NOT DETECTED Final   Listeria monocytogenes NOT DETECTED NOT DETECTED Final   Staphylococcus  species DETECTED (A) NOT DETECTED Final    Comment: CRITICAL RESULT CALLED TO, READ BACK BY AND VERIFIED WITH: JASON ROBBINS 12/06/2023 AT 0648 SRR    Staphylococcus aureus (BCID) DETECTED (A) NOT DETECTED Final    Comment: Methicillin (oxacillin)-resistant Staphylococcus aureus (MRSA). MRSA is predictably resistant to beta-lactam antibiotics (except ceftaroline). Preferred therapy is vancomycin  unless clinically contraindicated. Patient requires contact precautions if  hospitalized. CRITICAL RESULT CALLED TO, READ BACK BY AND VERIFIED WITH: JASON ROBBINS 12/06/2023 AT 0648 SRR    Staphylococcus epidermidis NOT DETECTED NOT DETECTED Final   Staphylococcus lugdunensis NOT DETECTED NOT DETECTED Final   Streptococcus species NOT DETECTED NOT DETECTED Final   Streptococcus agalactiae NOT DETECTED NOT DETECTED Final   Streptococcus pneumoniae NOT DETECTED NOT DETECTED Final   Streptococcus pyogenes NOT DETECTED NOT DETECTED Final   A.calcoaceticus-baumannii NOT  DETECTED NOT DETECTED Final   Bacteroides fragilis NOT DETECTED NOT DETECTED Final   Enterobacterales NOT DETECTED NOT DETECTED Final   Enterobacter cloacae complex NOT DETECTED NOT DETECTED Final   Escherichia coli NOT DETECTED NOT DETECTED Final   Klebsiella aerogenes NOT DETECTED NOT DETECTED Final   Klebsiella oxytoca NOT DETECTED NOT DETECTED Final   Klebsiella pneumoniae NOT DETECTED NOT DETECTED Final   Proteus species NOT DETECTED NOT DETECTED Final   Salmonella species NOT DETECTED NOT DETECTED Final   Serratia marcescens NOT DETECTED NOT DETECTED Final   Haemophilus influenzae NOT DETECTED NOT DETECTED Final   Neisseria meningitidis NOT DETECTED NOT DETECTED Final   Pseudomonas aeruginosa NOT DETECTED NOT DETECTED Final   Stenotrophomonas maltophilia NOT DETECTED NOT DETECTED Final   Candida albicans NOT DETECTED NOT DETECTED Final   Candida auris NOT DETECTED NOT DETECTED Final   Candida glabrata NOT DETECTED NOT  DETECTED Final   Candida krusei NOT DETECTED NOT DETECTED Final   Candida parapsilosis NOT DETECTED NOT DETECTED Final   Candida tropicalis NOT DETECTED NOT DETECTED Final   Cryptococcus neoformans/gattii NOT DETECTED NOT DETECTED Final   Meth resistant mecA/C and MREJ DETECTED (A) NOT DETECTED Final    Comment: CRITICAL RESULT CALLED TO, READ BACK BY AND VERIFIED WITH: JASON ROBBINS 12/06/2023 AT 9351 SRR Performed at [facility], [address] [place], [place] [address]   SARS Coronavirus 2 by RT PCR (hospital order, performed in [facility] hospital lab) *cepheid single result test* Anterior Nasal Swab     Status: None   Collection Time: 12/04/23  8:40 PM   Specimen: Anterior Nasal Swab  Result Value Ref Range Status   SARS Coronavirus 2 by RT PCR NEGATIVE NEGATIVE Final    Comment: (NOTE) SARS-CoV-2 target nucleic acids are NOT DETECTED.  The SARS-CoV-2 RNA is generally detectable in upper and lower respiratory specimens during the acute phase of infection. The lowest concentration of SARS-CoV-2 viral copies this assay can detect is 250 copies / mL. A negative result does not preclude SARS-CoV-2 infection and should not be used as the sole basis for treatment or other patient management decisions.  A negative result may occur with improper specimen collection / handling, submission of specimen other than nasopharyngeal swab, presence of viral mutation(s) within the areas targeted by this assay, and inadequate number of viral copies (<250 copies / mL). A negative result must be combined with clinical observations, patient history, and epidemiological information.  Fact Sheet for Patients:   RoadLapTop.co.za  Fact Sheet for Healthcare Providers: http://kim-miller.com/  This test is not yet approved or  cleared by the [place]  FDA and has been authorized for detection and/or diagnosis of SARS-CoV-2 by FDA under an  Emergency Use Authorization (EUA).  This EUA will remain in effect (meaning this test can be used) for the duration of the COVID-19 declaration under Section 564(b)(1) of the Act, 21 U.S.C. section 360bbb-3(b)(1), unless the authorization is terminated or revoked sooner.  Performed at [facility], [address], [place], [place] [address]   Urine Culture (for pregnant, neutropenic or urologic patients or patients with an indwelling urinary catheter)     Status: Abnormal   Collection Time: 12/04/23  8:45 PM   Specimen: Urine, Clean Catch  Result Value Ref Range Status   Specimen Description   Final    URINE, CLEAN CATCH Performed at [facility], [address] [place], [place] [address]    Special Requests   Final  NONE Performed at [facility], [address] [place], [place] [address]    Culture (A)  Final    <10,000 COLONIES/mL INSIGNIFICANT GROWTH Performed at [facility], [address], [place], [place] [address]    Report Status 12/06/2023 FINAL  Final  Respiratory (~20 pathogens) panel by PCR     Status: None   Collection Time: 12/04/23 11:30 PM   Specimen: Nasopharyngeal Swab; Respiratory  Result Value Ref Range Status   Adenovirus NOT DETECTED NOT DETECTED Final   Coronavirus 229E NOT DETECTED NOT DETECTED Final    Comment: (NOTE) The Coronavirus on the Respiratory Panel, DOES NOT test for the novel  Coronavirus (2019 nCoV)    Coronavirus HKU1 NOT DETECTED NOT DETECTED Final   Coronavirus NL63 NOT DETECTED NOT DETECTED Final   Coronavirus OC43 NOT DETECTED NOT DETECTED Final   Metapneumovirus NOT DETECTED NOT DETECTED Final   Rhinovirus / Enterovirus NOT DETECTED NOT DETECTED Final   Influenza A NOT DETECTED NOT DETECTED Final   Influenza B NOT DETECTED NOT DETECTED Final   Parainfluenza Virus 1 NOT DETECTED NOT DETECTED Final   Parainfluenza Virus 2 NOT DETECTED NOT DETECTED Final   Parainfluenza Virus 3 NOT DETECTED  NOT DETECTED Final   Parainfluenza Virus 4 NOT DETECTED NOT DETECTED Final   Respiratory Syncytial Virus NOT DETECTED NOT DETECTED Final   Bordetella pertussis NOT DETECTED NOT DETECTED Final   Bordetella Parapertussis NOT DETECTED NOT DETECTED Final   Chlamydophila pneumoniae NOT DETECTED NOT DETECTED Final   Mycoplasma pneumoniae NOT DETECTED NOT DETECTED Final    Comment: Performed at [facility], [address], [place], [place] [address]  Cath Tip Culture     Status: None   Collection Time: 12/06/23  2:45 PM   Specimen: Catheter Tip; Other  Result Value Ref Range Status   Specimen Description   Final    CATH TIP Performed at [facility], [address], [place], [place] [address]    Special Requests   Final    NONE Performed at [facility], [address], [place], [place] [address]    Culture   Final    NO GROWTH 2 DAYS Performed at [facility], [address] [place], [place] [address]    Report Status 12/10/2023 FINAL  Final  MRSA Next Gen by PCR, Nasal     Status: Abnormal   Collection Time: 12/07/23  9:00 AM   Specimen: Nasal Mucosa; Nasal Swab  Result Value Ref Range Status   MRSA by PCR Next Gen DETECTED (A) NOT DETECTED Final    Comment: RESULT CALLED TO, READ BACK BY AND VERIFIED WITH: ELIZABETH HAYS 12/07/23 1041 SLM (NOTE) The GeneXpert MRSA Assay (FDA approved for NASAL specimens only), is one component of a comprehensive MRSA colonization surveillance program. It is not intended to diagnose MRSA infection nor to guide or monitor treatment for MRSA infections. Test performance is not FDA approved in patients less than 36 years old. Performed at [facility], [address] [place], [place] [address]   Culture, blood (Routine X 2) w Reflex to ID Panel     Status: None (Preliminary result)   Collection Time: 12/07/23 12:50 PM   Specimen: BLOOD  Result Value Ref Range Status   Specimen Description BLOOD RIGHT  ANTECUBITAL  Final   Special Requests   Final    BOTTLES DRAWN AEROBIC AND ANAEROBIC Blood Culture adequate volume   Culture   Final  NO GROWTH 3 DAYS Performed at [facility], [address] [place], [place] [address]    Report Status PENDING  Incomplete  Culture, blood (Routine X 2) w Reflex to ID Panel     Status: None (Preliminary result)   Collection Time: 12/07/23 12:50 PM   Specimen: BLOOD  Result Value Ref Range Status   Specimen Description BLOOD LEFT ANTECUBITAL  Final   Special Requests   Final    BOTTLES DRAWN AEROBIC AND ANAEROBIC Blood Culture adequate volume   Culture   Final    NO GROWTH 3 DAYS Performed at [facility], [address] [place], [place] [address]    Report Status PENDING  Incomplete    Coagulation Studies: No results for input(s): LABPROT, INR in the last 72 hours.  Urinalysis: No results for input(s): COLORURINE, LABSPEC, PHURINE, GLUCOSEU, HGBUR, BILIRUBINUR, KETONESUR, PROTEINUR, UROBILINOGEN, NITRITE, LEUKOCYTESUR in the last 72 hours.  Invalid input(s): APPERANCEUR     Imaging: PERIPHERAL VASCULAR CATHETERIZATION Result Date: 12/10/2023 See surgical note for result.     Medications:    anticoagulant sodium citrate     vancomycin       sodium chloride    Intravenous Once   apixaban  5 mg Oral BID   aspirin  EC  81 mg Oral QHS   Chlorhexidine  Gluconate Cloth  6 each Topical Q0600   cloNIDine   0.2 mg Oral BID   diltiazem  120 mg Oral Daily   epoetin  alfa-epbx (RETACRIT ) injection  4,000 Units Intravenous Q T,Th,Sat-1800   insulin  aspart  0-5 Units Subcutaneous QHS   insulin  aspart  0-9 Units Subcutaneous TID WC   insulin  aspart  3 Units Subcutaneous TID WC   insulin  glargine-yfgn  10 Units Subcutaneous QHS   lactose free nutrition  237 mL Oral TID WC   lactulose   30 g Oral Once   lidocaine   10 mL Intradermal Once   losartan   100 mg Oral QHS   metoprolol  succinate  100 mg Oral  QHS   multivitamin  1 tablet Oral QHS   polyethylene glycol  17 g Oral Daily   vancomycin  variable dose per unstable renal function (pharmacist dosing)   Does not apply See admin instructions   acetaminophen  **OR** acetaminophen , albuterol , anticoagulant sodium citrate, guaiFENesin -dextromethorphan , heparin , hydrALAZINE , ondansetron  **OR** ondansetron  (ZOFRAN ) IV, oxyCODONE -acetaminophen , sucralfate , zolpidem   Assessment/ Plan:  Ms. VELMER BROADFOOT is a 78 y.o.  female  with past medical history of hypertension, long-standing diabetes mellitus type 2, lower extremity edema, hyperlipidemia, tobacco abuse, prior history of CVA, COPD, obstructive sleep apnea, lumbar spinal fusion, left upper lobe lung mass who presents with volume overload and increasing shortness of breath.   1.  Acute kidney injury/chronic kidney disease stage IV/diabetes mellitus type 2 with chronic kidney disease/proteinuria/MRSA bacteremia.  eGFR down to 11.  Baseline creatinine 2.7 in February 2025.  Creatinine increased to 3.6-4 in June 2025. - PermCath originally placed  (12/01/2023) and removed 12/06/2023.  Negative TEE 12/08/2023. - Case discussed with infectious diseases this a.m. 12/09/2023.  Will likely require IV antibiotics at discharge - Appreciate vascular surgery placing permcath on 6/25 -Outpatient dialysis seat at [facility] has been secured, first treatment was scheduled for 12/09/2023.  Patient to arrive at 11:30 AM. - Patient reports she may end up going to [facility] for rehab  Lab Results  Component Value Date   CREATININE 4.46 (H) 12/10/2023   CREATININE 3.78 (H) 12/09/2023   CREATININE 2.65 (H) 12/07/2023     Intake/Output  Summary (Last 24 hours) at 12/10/2023 1454 Last data filed at 12/09/2023 1500 Gross per 24 hour  Intake 125 ml  Output --  Net 125 ml       2.  Hyponatremia.  Serum sodium 127, Will correct some with dialysis  3.  Hyperkalemia Potassium 3.6   4.  HTN with chronic kidney  disease - 2D echo from 11/29/2023 shows LVEF 55 to 60%, grade 2 diastolic dysfunction, trivial mitral regurgitation Continue clonidine , losartan , and metoprolol . Blood pressure 122/44 post procedure.   5.  Anemia of chronic kidney disease.  Patient has  left upper lobe mass concerning for malignancy.  Discussed with Dr. Jacobo. Will continue low dose Epo with dialysis. Primary team has ordered blood transfusion with dialysis for Hgb 6.3     LOS: 12 Shantelle Breeze 6/25/20252:54 PM  Patient was seen and examined with Mckenzie Harris, NP.  Plan of care was formulated for the problems addressed and discussed with NP.  I agree with the note as documented except as noted below.

## 2023-12-10 NOTE — Op Note (Signed)
 [facility]   OPERATIVE NOTE     PROCEDURE: 1. Insertion of a left IJ tunneled dialysis catheter. 2. Catheter placement and cannulation under ultrasound and fluoroscopic guidance  PRE-OPERATIVE DIAGNOSIS: end-stage renal requiring hemodialysis  POST-OPERATIVE DIAGNOSIS: same as above  SURGEON: Cordella Shawl  ANESTHESIA: Conscious sedation was administered under my direct supervision by the interventional radiology RN. IV Versed  plus fentanyl  were utilized. Continuous ECG, pulse oximetry and blood pressure was monitored throughout the entire procedure.  Conscious sedation was for a total of 36 minutes.  ESTIMATED BLOOD LOSS: Minimal  FINDING(S): 1.  Tips of the catheter in the right atrium on fluoroscopy 2.  No obvious pneumothorax on fluoroscopy  SPECIMEN(S):  none  INDICATIONS:   Mckenzie Lawrence is a 78 y.o. female  presents with end stage renal disease.  Therefore, the patient requires a tunneled dialysis catheter placement.  The patient is informed of  the risks catheter placement include but are not limited to: bleeding, infection, central venous injury, pneumothorax, possible venous stenosis, possible malpositioning in the venous system, and possible infections related to long-term catheter presence.  The patient was aware of these risks and agreed to proceed.  DESCRIPTION: The patient was taken back to Special Procedure suite.  Prior to sedation, the patient was given IV antibiotics.  After obtaining adequate sedation, the patient was prepped and draped in the standard fashion for a left IJ tunneled dialysis catheter placement.  Appropriate Time Out is called.     The left neck and chest wall are then infiltrated with 1% Lidocaine  with epinepherine.  A 23 cm catheter is then selected, opened on the back table and prepped. Ultrasound is placed in a sterile sleeve.  Under ultrasound guidance, the left internal jugular vein is examined and is noted to be  echolucent and easily compressible indicating patency.   An image is recorded for the permanent record.  The left internal jugular vein is cannulated with the microneedle under direct ultrasound vissualization.  A Microwire followed by a micro sheath is then inserted without difficulty.   A J-wire was then advanced under fluoroscopic guidance into the inferior vena cava and the wire was secured.  Small counter incision was then made at the wire insertion site. A small pocket was fashioned with blunt dissection to allow easier passage of the cuff.  The dilator and peel-away sheath are then advanced over the wire under fluoroscopic guidance. The catheters and advanced through the peel-away sheath. It is approximated to the left chest wall after verifying the tips at the atrial caval junction and an exit site is selected.  Small incision is made at the selected exit site and the tunneling device was passed subcutaneously to the counter incision. Catheter is then pulled through the subcutaneous tunnel. The catheter is then verified for tip position under fluoroscopy, transected and the hub assembly connected.    Each port was tested by aspirating and flushing.  No resistance was noted.  Each port was then thoroughly flushed with heparinized saline.  The catheter was secured in placed with two interrupted stitches of 0 silk tied to the catheter.  The counter incision was closed with a U-stitch of 4-0 Monocryl.  The insertion site is then cleaned and sterile bandages applied including a Biopatch.  Each port was then packed with concentrated heparin  (1000 Units/mL) at the manufacturer recommended volumes to each port.  Sterile caps were applied to each port.  On completion fluoroscopy, the tips of the catheter  were in the right atrium, and there was no evidence of pneumothorax.  COMPLICATIONS: None  CONDITION: Unchanged   Cordella Shawl  vein and vascular Office: [phone]   12/10/2023, 9:37  AM
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
## 2023-12-10 NOTE — Progress Notes (Signed)
 Progress Note   Patient: Mckenzie Lawrence FMW:[id number] DOB: September 28, 1945 DOA: 11/28/2023     12 DOS: the patient was seen and examined on 12/10/2023   Brief hospital course: Mckenzie Lawrence is a 78 y.o. female with medical history significant for COPD, CVA, stage 4 CKD, SIADH, esophageal dysphagia treated with Botox  (last in 05/2023), DM, HLD, and HTN, last hospitalized from 2/1 to 07/26/2023 with MRSA bacteremia/sepsis/multifocal pneumonia, now presenting by EMS with shortness of breath and chest pressure.   She has no orthopnea and sleeps on 1 pillow.  She has a chronic congested cough that has not changed and has no fever or chills  BP on arrival 202/74 and tachypneic to 25 saturating at 98% on room air. WBC 11,000 with hemoglobin of 9 which is her baseline. Troponin 19 and BNP 2450 Creatinine 3.59 up from baseline of 2.74 with bicarb 19.  Glucose 207, potassium 5.2, sodium 123 (137 four months ago) EKG with sinus rhythm at 79 Chest x-ray showing small right pleural effusion and vascular congestion Patient treated with DuoNeb and Solu-Medrol  and given a dose of Lasix  Patient was diagnosed with acute on chronic diastolic congestive heart failure, she also had progressed to end-stage renal disease.  She was treated with initially with IV Lasix , then hemodialysis was started on 6/17. Blood culture positive again for MRSA, started on vancomycin .     Principal Problem:   Hypertensive urgency/emergency Active Problems:   COPD with acute exacerbation (HCC)   Acute dyspnea   CAD (coronary artery disease)   Chest pain   Acute renal failure superimposed on stage 4 chronic kidney disease (HCC)   Hyponatremia   Hypertensive emergency   Encounter for dialysis catheter care [facility])   Paroxysmal atrial fibrillation (HCC)   Assessment and Plan: MRSA septicemia:  Patient had a fever of 102.4 on 6/19, at the same time, heart rate of 92, leukocytosis of 12.1. Blood culture was positive for MRSA. Hx mrsa  bacteremia 2/24 2/2 infected spinal hardware, had recurrent bout of mrsa bacteremia earlier this year no source identified. Has dialysis catheter placed this hospitalization, removed by vascular on 6/21. PIV also exchanged Repeat blood cultures ordered 6/22, ngtd. TTE/TEE negative. ID following.  Planning for vancomycin  for the least 4 weeks, too early to perform another scan of the spine. Patient condition appears to be improving.   Paroxysmal a-fib:  new overnight 6/22, now back in sinus. Diltiazem and apixaban started. Tsh wnl   Hypertensive emergency:  Blood pressure more stable now.   Acute on chronic diastolic CHF:  w/ elevated BNP, CXR showing vascular congestion, pitting LE edema & dyspnea. Echo shows EF 50-55%, grade II diastolic dysfunction, no regional wall motion abnormalities. Patient is on dialysis, volume status has improved.   COPD exacerbation:  Initially treated with steroids, condition has improved.   Chest pain: w/ hx of CAD. Troponins are minimally elevated.  EKG shows no ischemic changes. Continue on losartan , metoprolol , statin, aspirin . No CP currently    ESRD progressing from [facility] Hyperkalemia secondary to end-stage renal disease. Hyponatremia. Anemia of chronic kidney disease. : tunneled HD cath placed. Started on scheduled HD 12/02/23.  Permacath placed on 6/25. Patient has significant anemia, hemoglobin dropped down to 6.3 today, will give a unit of PRBC. Recent iron study in 07/2023 showed a normal iron and B12 level.  No evidence of active bleeding.   Anxiety: severity unknown. Valium  discontinued 6/19   Esophageal dysphagia: s/p botox  via EGD in 08/2022. Aspiration precautions. Continue  w/ supportive care   OSA: CPAP qhs   DM2: well controlled at baseline, HbA1c 6.1. sugars labile here, on semglee /ssi   Debility: PT rec is now snf, toc consulted.            Subjective:  Patient feeling much better today, not short of breath.  No  cough.  Physical Exam: Vitals:   12/10/23 1036 12/10/23 1107 12/10/23 1131 12/10/23 1218  BP: (!) 122/44  (!) 119/43   Pulse: 67  66   Resp: 15 (!) 24  (!) 23  Temp: 98.1 F (36.7 C)  98.2 F (36.8 C)   TempSrc: Oral     SpO2: 99%  100%   Weight:      Height:       General exam: Appears calm and comfortable  Respiratory system: Clear to auscultation. Respiratory effort normal. Cardiovascular system: S1 & S2 heard, RRR. No JVD, murmurs, rubs, gallops or clicks. No pedal edema. Gastrointestinal system: Abdomen is nondistended, soft and nontender. No organomegaly or masses felt. Normal bowel sounds heard. Central nervous system: Alert and oriented. No focal neurological deficits. Extremities: Symmetric 5 x 5 power. Skin: No rashes, lesions or ulcers Psychiatry: Judgement and insight appear normal. Mood & affect appropriate.    Data Reviewed:  Reviewed chest x-ray results and lab results.  Family Communication: Daughter updated over the phone  Disposition: Status is: Inpatient Remains inpatient appropriate because: Severity of disease, inpatient procedure     Time spent: 60 minutes  Author: Murvin Mana, MD 12/10/2023 12:40 PM  For on call review [email or web address].

## 2023-12-10 NOTE — Progress Notes (Signed)
  Received patient in bed to unit.   Informed consent signed and in chart.    TX duration:3:30     Transported by  Hand-off given to patient's nurse. Pt tolerated tx.   Access used: cath Access issues: none   Total UF removed: 1.0 kg Medication(s) given: RBC Post HD VS: wnl Post HD weight: 71.3 kg     N. Kinley Dozier LPN [facility]

## 2023-12-10 NOTE — Interval H&P Note (Signed)
 History and Physical Interval Note:  12/10/2023 8:58 AM  Mckenzie Lawrence  has presented today for surgery, with the diagnosis of ESRD.  The various methods of treatment have been discussed with the patient and family. After consideration of risks, benefits and other options for treatment, the patient has consented to  Procedure(s): DIALYSIS/PERMA CATHETER INSERTION (N/A) as a surgical intervention.  The patient's history has been reviewed, patient examined, no change in status, stable for surgery.  I have reviewed the patient's chart and labs.  Questions were answered to the patient's satisfaction.     Cordella Shawl

## 2023-12-11 DIAGNOSIS — N186 End stage renal disease: Secondary | ICD-10-CM | POA: Diagnosis not present

## 2023-12-11 DIAGNOSIS — J441 Chronic obstructive pulmonary disease with (acute) exacerbation: Secondary | ICD-10-CM

## 2023-12-11 DIAGNOSIS — N179 Acute kidney failure, unspecified: Secondary | ICD-10-CM

## 2023-12-11 DIAGNOSIS — N184 Chronic kidney disease, stage 4 (severe): Secondary | ICD-10-CM

## 2023-12-11 DIAGNOSIS — I5033 Acute on chronic diastolic (congestive) heart failure: Secondary | ICD-10-CM | POA: Diagnosis not present

## 2023-12-11 DIAGNOSIS — Z4901 Encounter for fitting and adjustment of extracorporeal dialysis catheter: Secondary | ICD-10-CM | POA: Diagnosis not present

## 2023-12-11 DIAGNOSIS — I160 Hypertensive urgency: Secondary | ICD-10-CM | POA: Diagnosis not present

## 2023-12-11 LAB — RENAL FUNCTION PANEL
Albumin: 1.8 g/dL — ABNORMAL LOW (ref 3.5–5.0)
Anion gap: 9 (ref 5–15)
BUN: 28 mg/dL — ABNORMAL HIGH (ref 8–23)
CO2: 27 mmol/L (ref 22–32)
Calcium: 8 mg/dL — ABNORMAL LOW (ref 8.9–10.3)
Chloride: 96 mmol/L — ABNORMAL LOW (ref 98–111)
Creatinine, Ser: 2.71 mg/dL — ABNORMAL HIGH (ref 0.44–1.00)
GFR, Estimated: 18 mL/min — ABNORMAL LOW (ref >60)
Glucose, Bld: 222 mg/dL — ABNORMAL HIGH (ref 70–99)
Phosphorus: 3.7 mg/dL (ref 2.5–4.6)
Potassium: 3.7 mmol/L (ref 3.5–5.1)
Sodium: 132 mmol/L — ABNORMAL LOW (ref 135–145)

## 2023-12-11 LAB — HEMOGLOBIN: Hemoglobin: 7.6 g/dL — ABNORMAL LOW (ref 12.0–15.0)

## 2023-12-11 LAB — CBC
HCT: 22.2 % — ABNORMAL LOW (ref 36.0–46.0)
Hemoglobin: 7.1 g/dL — ABNORMAL LOW (ref 12.0–15.0)
MCH: 29.6 pg (ref 26.0–34.0)
MCHC: 32 g/dL (ref 30.0–36.0)
MCV: 92.5 fL (ref 80.0–100.0)
Platelets: 218 10*3/uL (ref 150–400)
RBC: 2.4 MIL/uL — ABNORMAL LOW (ref 3.87–5.11)
RDW: 15.1 % (ref 11.5–15.5)
WBC: 10.6 10*3/uL — ABNORMAL HIGH (ref 4.0–10.5)
nRBC: 0 % (ref 0.0–0.2)

## 2023-12-11 LAB — GLUCOSE, CAPILLARY
Glucose-Capillary: 152 mg/dL — ABNORMAL HIGH (ref 70–99)
Glucose-Capillary: 178 mg/dL — ABNORMAL HIGH (ref 70–99)
Glucose-Capillary: 161 mg/dL — ABNORMAL HIGH (ref 70–99)

## 2023-12-11 MED ORDER — EPOETIN ALFA-EPBX 4000 UNIT/ML IJ SOLN
INTRAMUSCULAR | Status: AC
  Filled 2023-12-11: qty 1

## 2023-12-11 MED ORDER — HEPARIN SODIUM (PORCINE) 1000 UNIT/ML IJ SOLN
INTRAMUSCULAR | Status: AC
  Filled 2023-12-11: qty 10

## 2023-12-11 MED ORDER — APIXABAN 2.5 MG PO TABS
2.5000 mg | ORAL_TABLET | Freq: Two times a day (BID) | ORAL | Status: DC
  Administered 2023-12-11 – 2023-12-15 (×8): 2.5 mg via ORAL
  Filled 2023-12-11 (×8): qty 1

## 2023-12-11 MED ORDER — VANCOMYCIN HCL 1500 MG/300ML IV SOLN
1500.0000 mg | INTRAVENOUS | Status: AC
  Administered 2023-12-11: 1500 mg via INTRAVENOUS
  Filled 2023-12-11: qty 300

## 2023-12-11 MED ORDER — IPRATROPIUM-ALBUTEROL 0.5-2.5 (3) MG/3ML IN SOLN
3.0000 mL | Freq: Four times a day (QID) | RESPIRATORY_TRACT | Status: DC
  Administered 2023-12-11 – 2023-12-12 (×4): 3 mL via RESPIRATORY_TRACT
  Filled 2023-12-11 (×4): qty 3

## 2023-12-11 MED ORDER — AMIODARONE HCL 200 MG PO TABS
400.0000 mg | ORAL_TABLET | Freq: Two times a day (BID) | ORAL | Status: DC
  Administered 2023-12-11 – 2023-12-15 (×9): 400 mg via ORAL
  Filled 2023-12-11 (×9): qty 2

## 2023-12-11 MED ORDER — AMIODARONE HCL 200 MG PO TABS: 200.0000 mg | ORAL_TABLET | Freq: Every day | ORAL | Status: DC

## 2023-12-11 NOTE — Progress Notes (Signed)
 [facility]  ROUNDING NOTE   Subjective:  [name] is a 78 y.o.  female with past medical history of hypertension, long-standing diabetes mellitus type 2, lower extremity edema, hyperlipidemia, tobacco abuse, prior history of CVA, COPD, obstructive sleep apnea, lumbar spinal fusion, left upper lobe lung mass who presents with volume overload and increasing shortness of breath.   Update:  Patient seen and evaluated during dialysis   HEMODIALYSIS FLOWSHEET:  Blood Flow Rate (mL/min): 400 mL/min Arterial Pressure (mmHg): -174.33 mmHg Venous Pressure (mmHg): 154.54 mmHg TMP (mmHg): 2.02 mmHg Ultrafiltration Rate (mL/min): 617 mL/min Dialysate Flow Rate (mL/min): 299 ml/min Dialysis Fluid Bolus: Normal Saline  Tolerating treatment well   Objective:  Vital signs in last 24 hours:  Temp:  [98 F (36.7 C)-99.2 F (37.3 C)] 98.4 F (36.9 C) (06/26 0800) Pulse Rate:  [25-109] 46 (06/26 1030) Resp:  [17-30] 25 (06/26 1030) BP: (109-155)/(36-115) 111/92 (06/26 1030) SpO2:  [87 %-100 %] 100 % (06/26 1030) Weight:  [71.3 kg-76.4 kg] 74.6 kg (06/26 0800)  Weight change: -8.8 kg Filed Weights   12/10/23 1642 12/11/23 0500 12/11/23 0800  Weight: 71.3 kg 76.4 kg 74.6 kg    Intake/Output: I/O last 3 completed shifts: In: 362.4 [Blood:362.4] Out: 1000 [Other:1000]   Intake/Output this shift:  No intake/output data recorded.  Physical Exam: General: No acute distress  Head: Normocephalic, atraumatic. Dry mucosal membranes  Eyes: Anicteric  Neck: Supple  Lungs:  Normal breathing effort  Heart: Regular rate and rhythm  Abdomen:  Soft, nontender, BS present  Extremities: No peripheral edema.  Neurologic: Nonfocal, moving all four extremities  Skin: No lesions  Dialysis Access: Right IJ PermCath placed on 6/25    Basic Metabolic Panel: Recent Labs  Lab 12/06/23 0750 12/07/23 1219 12/09/23 0509 12/10/23 1400 12/11/23 0811  NA 130* 132* 132* 127*  132*  K 3.9 3.5 3.8 3.6 3.7  CL 93* 94* 95* 91* 96*  CO2 26 27 26 24 27   GLUCOSE 182* 238* 218* 238* 222*  BUN 32* 28* 41* 51* 28*  CREATININE 3.03* 2.65* 3.78* 4.46* 2.71*  CALCIUM  7.9* 7.7* 8.1* 7.8* 8.0*  PHOS 4.0 2.8  --  5.4* 3.7    Liver Function Tests: Recent Labs  Lab 12/06/23 0750 12/07/23 1219 12/10/23 1400 12/11/23 0811  ALBUMIN 2.0* 1.8* 1.7* 1.8*   No results for input(s): LIPASE, AMYLASE in the last 168 hours. No results for input(s): AMMONIA in the last 168 hours.  CBC: Recent Labs  Lab 12/06/23 0750 12/07/23 1219 12/09/23 0509 12/10/23 0326 12/11/23 0336  WBC 15.6* 13.2* 13.8* 13.8* 10.6*  HGB 8.2* 7.1* 7.3* 6.3* 7.1*  HCT 25.2* 21.6* 22.8* 19.8* 22.2*  MCV 94.0 93.9 93.1 92.5 92.5  PLT 193 150 209 212 218    Cardiac Enzymes: No results for input(s): CKTOTAL, CKMB, CKMBINDEX, TROPONINI in the last 168 hours.  BNP: Invalid input(s): POCBNP  CBG: Recent Labs  Lab 12/09/23 2013 12/10/23 0736 12/10/23 1129 12/10/23 1700 12/10/23 2025  GLUCAP 184* 150* 204* 165* 263*    Microbiology: Results for orders placed or performed during the hospital encounter of 11/28/23  Culture, blood (Routine X 2) w Reflex to ID Panel     Status: Abnormal (Preliminary result)   Collection Time: 12/04/23  6:34 PM   Specimen: BLOOD  Result Value Ref Range Status   Specimen Description   Final    BLOOD BLOOD RIGHT ARM Performed at [facility], [address], [place], [place]  [address]    Special Requests   Final    BOTTLES DRAWN AEROBIC AND ANAEROBIC Blood Culture adequate volume Performed at [facility], [address] [place], [place] [address]    Culture  Setup Time   Final    Organism ID to follow IN BOTH AEROBIC AND ANAEROBIC BOTTLES GRAM POSITIVE COCCI CRITICAL RESULT CALLED TO, READ BACK BY AND VERIFIED WITH: SELINDA SIMPERS 12/06/2023 AT 9351 SRR Performed at [facility], [address]  [place], [place] [address]    Culture (A)  Final    METHICILLIN RESISTANT STAPHYLOCOCCUS AUREUS Sent to [facility] for further susceptibility testing. Performed at [facility], [address], [place], [place] [address]    Report Status PENDING  Incomplete   Organism ID, Bacteria METHICILLIN RESISTANT STAPHYLOCOCCUS AUREUS  Final      Susceptibility   Methicillin resistant staphylococcus aureus - MIC*    CIPROFLOXACIN  >=8 RESISTANT Resistant     ERYTHROMYCIN  >=8 RESISTANT Resistant     GENTAMICIN  <=0.5 SENSITIVE Sensitive     OXACILLIN >=4 RESISTANT Resistant     TETRACYCLINE <=1 SENSITIVE Sensitive     VANCOMYCIN  2 SENSITIVE Sensitive     TRIMETH/SULFA <=10 SENSITIVE Sensitive     CLINDAMYCIN <=0.25 SENSITIVE Sensitive     RIFAMPIN <=0.5 SENSITIVE Sensitive     Inducible Clindamycin NEGATIVE Sensitive     LINEZOLID  2 SENSITIVE Sensitive     * METHICILLIN RESISTANT STAPHYLOCOCCUS AUREUS  Culture, blood (Routine X 2) w Reflex to ID Panel     Status: Abnormal   Collection Time: 12/04/23  6:34 PM   Specimen: BLOOD  Result Value Ref Range Status   Specimen Description   Final    BLOOD BLOOD LEFT ARM Performed at [facility], [address] [place], [place] [address]    Special Requests   Final    BOTTLES DRAWN AEROBIC AND ANAEROBIC Blood Culture adequate volume Performed at [facility], [address] [place], [place] [address]    Culture  Setup Time   Final    GRAM POSITIVE COCCI IN BOTH AEROBIC AND ANAEROBIC BOTTLES CRITICAL VALUE NOTED.  VALUE IS CONSISTENT WITH PREVIOUSLY REPORTED AND CALLED VALUE. Performed at [facility], [address] [place], [place] [address]    Culture (A)  Final    STAPHYLOCOCCUS AUREUS SUSCEPTIBILITIES PERFORMED ON PREVIOUS CULTURE WITHIN THE LAST 5 DAYS. Performed at [facility], [address] [place], [place] [address]    Report Status 12/08/2023 FINAL  Final  Blood Culture ID Panel (Reflexed)      Status: Abnormal   Collection Time: 12/04/23  6:34 PM  Result Value Ref Range Status   Enterococcus faecalis NOT DETECTED NOT DETECTED Final   Enterococcus Faecium NOT DETECTED NOT DETECTED Final   Listeria monocytogenes NOT DETECTED NOT DETECTED Final   Staphylococcus species DETECTED (A) NOT DETECTED Final    Comment: CRITICAL RESULT CALLED TO, READ BACK BY AND VERIFIED WITH: JASON ROBBINS 12/06/2023 AT 0648 SRR    Staphylococcus aureus (BCID) DETECTED (A) NOT DETECTED Final    Comment: Methicillin (oxacillin)-resistant Staphylococcus aureus (MRSA). MRSA is predictably resistant to beta-lactam antibiotics (except ceftaroline). Preferred therapy is vancomycin  unless clinically contraindicated. Patient requires contact precautions if  hospitalized. CRITICAL RESULT CALLED TO, READ BACK BY AND VERIFIED WITH: JASON ROBBINS 12/06/2023 AT 0648 SRR    Staphylococcus epidermidis NOT DETECTED NOT DETECTED Final   Staphylococcus lugdunensis NOT DETECTED NOT DETECTED Final   Streptococcus species  NOT DETECTED NOT DETECTED Final   Streptococcus agalactiae NOT DETECTED NOT DETECTED Final   Streptococcus pneumoniae NOT DETECTED NOT DETECTED Final   Streptococcus pyogenes NOT DETECTED NOT DETECTED Final   A.calcoaceticus-baumannii NOT DETECTED NOT DETECTED Final   Bacteroides fragilis NOT DETECTED NOT DETECTED Final   Enterobacterales NOT DETECTED NOT DETECTED Final   Enterobacter cloacae complex NOT DETECTED NOT DETECTED Final   Escherichia coli NOT DETECTED NOT DETECTED Final   Klebsiella aerogenes NOT DETECTED NOT DETECTED Final   Klebsiella oxytoca NOT DETECTED NOT DETECTED Final   Klebsiella pneumoniae NOT DETECTED NOT DETECTED Final   Proteus species NOT DETECTED NOT DETECTED Final   Salmonella species NOT DETECTED NOT DETECTED Final   Serratia marcescens NOT DETECTED NOT DETECTED Final   Haemophilus influenzae NOT DETECTED NOT DETECTED Final   Neisseria meningitidis NOT DETECTED NOT  DETECTED Final   Pseudomonas aeruginosa NOT DETECTED NOT DETECTED Final   Stenotrophomonas maltophilia NOT DETECTED NOT DETECTED Final   Candida albicans NOT DETECTED NOT DETECTED Final   Candida auris NOT DETECTED NOT DETECTED Final   Candida glabrata NOT DETECTED NOT DETECTED Final   Candida krusei NOT DETECTED NOT DETECTED Final   Candida parapsilosis NOT DETECTED NOT DETECTED Final   Candida tropicalis NOT DETECTED NOT DETECTED Final   Cryptococcus neoformans/gattii NOT DETECTED NOT DETECTED Final   Meth resistant mecA/C and MREJ DETECTED (A) NOT DETECTED Final    Comment: CRITICAL RESULT CALLED TO, READ BACK BY AND VERIFIED WITH: JASON ROBBINS 12/06/2023 AT 9351 SRR Performed at [facility], [address] [place], [place] [address]   SARS Coronavirus 2 by RT PCR (hospital order, performed in [facility] hospital lab) *cepheid single result test* Anterior Nasal Swab     Status: None   Collection Time: 12/04/23  8:40 PM   Specimen: Anterior Nasal Swab  Result Value Ref Range Status   SARS Coronavirus 2 by RT PCR NEGATIVE NEGATIVE Final    Comment: (NOTE) SARS-CoV-2 target nucleic acids are NOT DETECTED.  The SARS-CoV-2 RNA is generally detectable in upper and lower respiratory specimens during the acute phase of infection. The lowest concentration of SARS-CoV-2 viral copies this assay can detect is 250 copies / mL. A negative result does not preclude SARS-CoV-2 infection and should not be used as the sole basis for treatment or other patient management decisions.  A negative result may occur with improper specimen collection / handling, submission of specimen other than nasopharyngeal swab, presence of viral mutation(s) within the areas targeted by this assay, and inadequate number of viral copies (<250 copies / mL). A negative result must be combined with clinical observations, patient history, and epidemiological information.  Fact Sheet for Patients:    RoadLapTop.co.za  Fact Sheet for Healthcare Providers: http://kim-miller.com/  This test is not yet approved or  cleared by the [place]  FDA and has been authorized for detection and/or diagnosis of SARS-CoV-2 by FDA under an Emergency Use Authorization (EUA).  This EUA will remain in effect (meaning this test can be used) for the duration of the COVID-19 declaration under Section 564(b)(1) of the Act, 21 U.S.C. section 360bbb-3(b)(1), unless the authorization is terminated or revoked sooner.  Performed at [facility], [address], [place], [place] [address]   Urine Culture (for pregnant, neutropenic or urologic patients or patients with an indwelling urinary catheter)     Status: Abnormal   Collection Time: 12/04/23  8:45 PM   Specimen: Urine, Clean Catch  Result Value Ref  Range Status   Specimen Description   Final    URINE, CLEAN CATCH Performed at [facility], [address], [place], [place] [address]    Special Requests   Final    NONE Performed at [facility], [address] [place], [place] [address]    Culture (A)  Final    <10,000 COLONIES/mL INSIGNIFICANT GROWTH Performed at [facility], [address], [place], [place] [address]    Report Status 12/06/2023 FINAL  Final  Respiratory (~20 pathogens) panel by PCR     Status: None   Collection Time: 12/04/23 11:30 PM   Specimen: Nasopharyngeal Swab; Respiratory  Result Value Ref Range Status   Adenovirus NOT DETECTED NOT DETECTED Final   Coronavirus 229E NOT DETECTED NOT DETECTED Final    Comment: (NOTE) The Coronavirus on the Respiratory Panel, DOES NOT test for the novel  Coronavirus (2019 nCoV)    Coronavirus HKU1 NOT DETECTED NOT DETECTED Final   Coronavirus NL63 NOT DETECTED NOT DETECTED Final   Coronavirus OC43 NOT DETECTED NOT DETECTED Final   Metapneumovirus NOT DETECTED NOT DETECTED Final   Rhinovirus /  Enterovirus NOT DETECTED NOT DETECTED Final   Influenza A NOT DETECTED NOT DETECTED Final   Influenza B NOT DETECTED NOT DETECTED Final   Parainfluenza Virus 1 NOT DETECTED NOT DETECTED Final   Parainfluenza Virus 2 NOT DETECTED NOT DETECTED Final   Parainfluenza Virus 3 NOT DETECTED NOT DETECTED Final   Parainfluenza Virus 4 NOT DETECTED NOT DETECTED Final   Respiratory Syncytial Virus NOT DETECTED NOT DETECTED Final   Bordetella pertussis NOT DETECTED NOT DETECTED Final   Bordetella Parapertussis NOT DETECTED NOT DETECTED Final   Chlamydophila pneumoniae NOT DETECTED NOT DETECTED Final   Mycoplasma pneumoniae NOT DETECTED NOT DETECTED Final    Comment: Performed at [facility], [address], [place], [place] [address]  Cath Tip Culture     Status: None   Collection Time: 12/06/23  2:45 PM   Specimen: Catheter Tip; Other  Result Value Ref Range Status   Specimen Description   Final    CATH TIP Performed at [facility], [address], [place], [place] [address]    Special Requests   Final    NONE Performed at [facility], [address] [place], [place] [address]    Culture   Final    NO GROWTH 2 DAYS Performed at [facility], [address], [place], [place] [address]    Report Status 12/10/2023 FINAL  Final  MRSA Next Gen by PCR, Nasal     Status: Abnormal   Collection Time: 12/07/23  9:00 AM   Specimen: Nasal Mucosa; Nasal Swab  Result Value Ref Range Status   MRSA by PCR Next Gen DETECTED (A) NOT DETECTED Final    Comment: RESULT CALLED TO, READ BACK BY AND VERIFIED WITH: ELIZABETH HAYS 12/07/23 1041 SLM (NOTE) The GeneXpert MRSA Assay (FDA approved for NASAL specimens only), is one component of a comprehensive MRSA colonization surveillance program. It is not intended to diagnose MRSA infection nor to guide or monitor treatment for MRSA infections. Test performance is not FDA approved in patients less than 58 years old. Performed  at [facility], [address] [place], [place] [address]   Culture, blood (Routine X 2) w Reflex to ID Panel     Status: None (Preliminary result)   Collection Time: 12/07/23 12:50 PM   Specimen: BLOOD  Result  Value Ref Range Status   Specimen Description BLOOD RIGHT ANTECUBITAL  Final   Special Requests   Final    BOTTLES DRAWN AEROBIC AND ANAEROBIC Blood Culture adequate volume   Culture   Final    NO GROWTH 4 DAYS Performed at [facility], [address] [place], [place] [address]    Report Status PENDING  Incomplete  Culture, blood (Routine X 2) w Reflex to ID Panel     Status: None (Preliminary result)   Collection Time: 12/07/23 12:50 PM   Specimen: BLOOD  Result Value Ref Range Status   Specimen Description BLOOD LEFT ANTECUBITAL  Final   Special Requests   Final    BOTTLES DRAWN AEROBIC AND ANAEROBIC Blood Culture adequate volume   Culture   Final    NO GROWTH 4 DAYS Performed at [facility], [address] [place], [place] [address]    Report Status PENDING  Incomplete    Coagulation Studies: No results for input(s): LABPROT, INR in the last 72 hours.  Urinalysis: No results for input(s): COLORURINE, LABSPEC, PHURINE, GLUCOSEU, HGBUR, BILIRUBINUR, KETONESUR, PROTEINUR, UROBILINOGEN, NITRITE, LEUKOCYTESUR in the last 72 hours.  Invalid input(s): APPERANCEUR     Imaging: PERIPHERAL VASCULAR CATHETERIZATION Result Date: 12/10/2023 See surgical note for result.     Medications:    anticoagulant sodium citrate      sodium chloride    Intravenous Once   amiodarone  400 mg Oral BID   Followed by   NOREEN ON 12/21/2023] amiodarone  200 mg Oral Daily   apixaban  5 mg Oral BID   aspirin  EC  81 mg Oral QHS   Chlorhexidine  Gluconate Cloth  6 each Topical Q0600   cloNIDine   0.2 mg Oral BID   diltiazem  120 mg Oral Daily   epoetin  alfa-epbx (RETACRIT ) injection  4,000 Units Intravenous Q T,Th,Sat-1800    insulin  aspart  0-5 Units Subcutaneous QHS   insulin  aspart  0-9 Units Subcutaneous TID WC   insulin  aspart  3 Units Subcutaneous TID WC   insulin  glargine-yfgn  10 Units Subcutaneous QHS   lactose free nutrition  237 mL Oral TID WC   lactulose   30 g Oral Once   lidocaine   10 mL Intradermal Once   losartan   100 mg Oral QHS   metoprolol  succinate  100 mg Oral QHS   multivitamin  1 tablet Oral QHS   polyethylene glycol  17 g Oral Daily   vancomycin  variable dose per unstable renal function (pharmacist dosing)   Does not apply See admin instructions   acetaminophen  **OR** acetaminophen , albuterol , anticoagulant sodium citrate, guaiFENesin -dextromethorphan , heparin , hydrALAZINE , ondansetron  **OR** ondansetron  (ZOFRAN ) IV, oxyCODONE -acetaminophen , sucralfate , zolpidem   Assessment/ Plan:  [name] is a 78 y.o.  female  with past medical history of hypertension, long-standing diabetes mellitus type 2, lower extremity edema, hyperlipidemia, tobacco abuse, prior history of CVA, COPD, obstructive sleep apnea, lumbar spinal fusion, left upper lobe lung mass who presents with volume overload and increasing shortness of breath.   1.  Acute kidney injury/chronic kidney disease stage IV/diabetes mellitus type 2 with chronic kidney disease/proteinuria/MRSA bacteremia.  eGFR down to 11.  Baseline creatinine 2.7 in February 2025.  Creatinine increased to 3.6-4 in June 2025. - PermCath originally placed  (12/01/2023) and removed 12/06/2023.  Negative TEE 12/08/2023. - Case discussed with infectious diseases on 12/09/2023.  Will likely require IV antibiotics at discharge - Appreciate vascular surgery placing permcath on 6/25 -Outpatient dialysis seat at [facility] [employer]  has been secured. Patient to arrive at 11:30 AM. - Patient received dialysis treatment yesterday due to missed treatment for line holiday. - Patient received short dialysis treatment today to maintain outpatient schedule. -Next treatment  scheduled for Saturday  Lab Results  Component Value Date   CREATININE 2.71 (H) 12/11/2023   CREATININE 4.46 (H) 12/10/2023   CREATININE 3.78 (H) 12/09/2023     Intake/Output Summary (Last 24 hours) at 12/11/2023 1101 Last data filed at 12/10/2023 1626 Gross per 24 hour  Intake 362.35 ml  Output 1000 ml  Net -637.65 ml       2.  Hyponatremia.  Serum sodium improving with dialysis treatments.  3.  Hyperkalemia Resolved, will continue management with dialysis.   4.  HTN with chronic kidney disease - 2D echo from 11/29/2023 shows LVEF 55 to 60%, grade 2 diastolic dysfunction, trivial mitral regurgitation Continue clonidine , losartan , and metoprolol . Blood pressure 131/50 during dialysis   5.  Anemia of chronic kidney disease.  Patient has  left upper lobe mass concerning for malignancy.  Discussed with Dr. Jacobo. Will continue low dose Epo with dialysis.  Hemoglobin remains decreased but improved to 7.1 from 6.3 yesterday.  Patient did receive 1 unit blood transfusion with dialysis yesterday.     LOS: 13 [place] 6/26/202511:01 AM

## 2023-12-11 NOTE — Progress Notes (Signed)
 Pharmacy Antibiotic Note  Mckenzie Lawrence is a 78 y.o. female admitted on 11/28/2023 with hypertensive urgency/emergency, possible new onset CHF, and acute renal failure on CKD. Patient has PMH of DM, lumbar spinal fusion, HLD, and CKD. Patient had PermCath placed on 6/16 and was started on HD. Blood cultures from 6/19 showing MRSA Bactermia. Permath removed 6/21.  Pharmacy has been consulted for Vancomycin  dosing.  Today, 12/11/2023 Day #6 vancomycin  Renal: AKI on CKD with new start of HD this admission with development of likely line infection with MRSA Patient with residual renal function (still making urine) WBC 13.8 afebrile 6/21 Cath tip culture: NGTD 6/22 repeat Bcx: NGTD 6/23 TEE: no evidence of vegetation 6/25 tunneled Left internal jugular HD catheter placed. HD resumed 6/25 **NOTE** order placed 6/25 for vancomycin  1gm IV  x1 with HD at 2pm (patient was in HD until 4pm).  This vancomycin  dose was not given during HD and daughter refused dose be given via PIV on floor (d/t concern of possible extravasation earlier this admission)  Vancomycin  levels: Vancomycin  1500mg  IV x 1 given 6/21 at 0929 6/22 Random vancomycin  level at 0657 = 13 mcg/ml 6/23 Random vancomycin  level at 0346 = 9 mcg/ml Vancomycin  1500mg  IV x1 given 6/23 at 1610 6/24 random vancomycin  level at 0509 = 27 mcg/ml 6/25 random vancomycin  level at 0326 = 21 mcg/ml.  Estimated Half-life based on 6/24 and 6/25 levels is ~62 hours  Plan: Vancomycin  dose was not given 6/25 with HD as ordered, give vancomycin  1500mg  IV x1 today with HD (d/w HD RN since dose missed yesterday).  This dose is based on 6/25 random vancomycin  level and accounting for vancomycin  drug removal with yesterday and today's HD sessions.   Check random vancomycin  level Saturday morning prior to HD due to missed dose and daily HD for at least 2 days.  Plan for HD is TTS per Nephrology Due to residual renal function, anticipate may need higher dose with HD.   Plan will be vancomycin  1gm IV qHD ID following Daptomycin  MIC ordered 6/24 - sent to [facility]  Height: 5' 7 (170.2 cm) Weight: 76.4 kg (168 lb 6.9 oz) IBW/kg (Calculated) : 61.6  Temp (24hrs), Avg:98.4 F (36.9 C), Min:98 F (36.7 C), Max:99.2 F (37.3 C)  Recent Labs  Lab 12/06/23 0750 12/07/23 0657 12/07/23 1219 12/08/23 0346 12/09/23 0509 12/10/23 0326 12/10/23 1400 12/11/23 0336  WBC 15.6*  --  13.2*  --  13.8* 13.8*  --  10.6*  CREATININE 3.03*  --  2.65*  --  3.78*  --  4.46*  --   VANCOTROUGH  --  12*  --   --   --   --   --   --   VANCORANDOM  --   --   --    < > 27 21  --   --    < > = values in this interval not displayed.    Estimated Creatinine Clearance: 11.4 mL/min (A) (by C-G formula based on SCr of 4.46 mg/dL (H)).    Allergies  Allergen Reactions   Iodine Anaphylaxis and Other (See Comments)    Pt states that she is allergic to ingested iodine only, okay for betadine.     Iodine I-131 Tositumomab Anaphylaxis   Shellfish Allergy Anaphylaxis   Codeine Nausea And Vomiting   Morphine  Sulfate Nausea And Vomiting   Irbesartan Other (See Comments)     Unknown  (Avapro)   Sulfa Antibiotics Other (See Comments)  Fever     Antimicrobials this admission:  6/21 Vancomycin   >>   Dose adjustments this admission:   Microbiology results: 6/19  BCx: MRSA   Thank you for allowing pharmacy to be a part of this patient's care.  Nahiem Dredge, PharmD, BCPS, BCIDP Work Cell: [phone] 12/11/2023 8:05 AM

## 2023-12-11 NOTE — Progress Notes (Addendum)
 [facility] CLINIC CARDIOLOGY PROGRESS NOTE       Patient ID: Mckenzie Lawrence MRN: [id number] DOB/AGE: Oct 15, 1945 78 y.o.  Admit date: 11/28/2023 Referring Physician Dr. Delayne Solian Primary Physician [name], MD Primary Cardiologist Dr. Ammon Reason for Consultation diastolic heart failure with SOB.  HPI: Mckenzie Lawrence is a 78 y.o. female  with a past medical history of coronary artery disease, hypertension, hyperlipidemia, COPD, OSA, hx CVA, CKD stage 4, SIADH, diabetes mellitus who presented to the ED on 11/28/2023 for worsening and persistent SOB and lower extremity swelling with concurrent COPD exacerbation and found elevated BNP of 2500. Cardiology was consulted for further evaluation.   Interval History:   -Patient seen and examined this AM at HD. Patient laying comfortably in HD bed. Patient states she feels okay. Reports her SOB feels better today. Continues to deny chest pain or palpitations. -Patients BP and HR elevated this AM. Per tele in AF rates 90-100s. Patient went back into AF early this AM (06/26) and remains in AF. Patient converted to SR on 06/26 around 12PM, now in SR rate 60s -Patient reports having 2 episodes of nose bleeds. -Patient remains on 4L with stable SpO2.  -Repeat Blood cultures, no growth in 4 days   Review of systems complete and found to be negative unless listed above    Past Medical History:  Diagnosis Date   Anemia    Arthritis    Bladder incontinence    Broken foot    Cataracts, bilateral    Chronic kidney insufficiency    stage 3b   COPD (chronic obstructive pulmonary disease) (HCC)    wheezing   Coronary artery disease 04/25/2022   in [place]   COVID 2021   very mild case   CVA (cerebral vascular accident) (HCC) 2016   has had 3 strokes, states right side is slightlyweaker than left   Diabetes mellitus    insulin  dependent, Type 2   GERD (gastroesophageal reflux disease)    HLD (hyperlipidemia)    Hx of cardiovascular stress  test    a. ETT (6/13):  Ex 5:13; no ischemic changes   Hypertension    controlled on meds   Lacunar stroke of left subthalamic region [facility]) 02/2015   Leg pain    left   Lower back pain    Neuromuscular disorder (HCC)    stroke right hand tingling   Orthostatic hypotension    Osteopenia 01/2017   T score -2.0 stable from prior DEXA   Pancreatitis 10/2021   PCOS (polycystic ovarian syndrome)    Personal history of tobacco use, presenting hazards to health 01/09/2015   PONV (postoperative nausea and vomiting)    Sleep apnea    uses CPAP nightly    Past Surgical History:  Procedure Laterality Date   BIOPSY  08/28/2022   Procedure: BIOPSY;  Surgeon: Elicia Claw, MD;  Location: [facility];  Service: Gastroenterology;;   BOTOX  INJECTION  01/15/2022   Procedure: BOTOX  INJECTION;  Surgeon: Rosalie Kitchens, MD;  Location: [facility];  Service: Gastroenterology;;   BOTOX  INJECTION N/A 08/28/2022   Procedure: BOTOX  INJECTION;  Surgeon: Elicia Claw, MD;  Location: [facility];  Service: Gastroenterology;  Laterality: N/A;   BOTOX  INJECTION Bilateral 05/27/2023   Procedure: BOTOX  INJECTION;  Surgeon: Rosalie Kitchens, MD;  Location: [facility];  Service: Gastroenterology;  Laterality: Bilateral;   BROW LIFT Bilateral 11/04/2017   Procedure: BLEPHAROPLASTY UPPER EYELID W/EXCESS SKIN;  Surgeon: Ashley Greig CHRISTELLA, MD;  Location: [facility];  Service: Ophthalmology;  Laterality: Bilateral;  DIABETES-insulin  dependent uses CPAP   CARDIAC CATHETERIZATION  20 yrs ago   found nothing   CARPAL TUNNEL RELEASE Bilateral    CATARACT EXTRACTION Bilateral    DIALYSIS/PERMA CATHETER INSERTION N/A 12/01/2023   Procedure: DIALYSIS/PERMA CATHETER INSERTION;  Surgeon: Marea Selinda RAMAN, MD;  Location: [facility];  Service: Cardiovascular;  Laterality: N/A;   DIALYSIS/PERMA CATHETER INSERTION N/A 12/10/2023   Procedure: DIALYSIS/PERMA CATHETER INSERTION;  Surgeon: Jama Cordella MATSU, MD;   Location: [facility];  Service: Cardiovascular;  Laterality: N/A;   ELBOW SURGERY Bilateral    ESOPHAGOGASTRODUODENOSCOPY N/A 07/25/2021   Procedure: ESOPHAGOGASTRODUODENOSCOPY (EGD);  Surgeon: Toledo, Ladell POUR, MD;  Location: [facility];  Service: Gastroenterology;  Laterality: N/A;  IDDM   ESOPHAGOGASTRODUODENOSCOPY N/A 01/15/2022   Procedure: ESOPHAGOGASTRODUODENOSCOPY (EGD);  Surgeon: Rosalie Kitchens, MD;  Location: [facility];  Service: Gastroenterology;  Laterality: N/A;  botox    ESOPHAGOGASTRODUODENOSCOPY (EGD) WITH PROPOFOL  N/A 08/28/2022   Procedure: ESOPHAGOGASTRODUODENOSCOPY (EGD) WITH PROPOFOL ;  Surgeon: Elicia Claw, MD;  Location: [facility];  Service: Gastroenterology;  Laterality: N/A;   ESOPHAGOGASTRODUODENOSCOPY (EGD) WITH PROPOFOL  Bilateral 05/27/2023   Procedure: ESOPHAGOGASTRODUODENOSCOPY (EGD) WITH PROPOFOL ;  Surgeon: Rosalie Kitchens, MD;  Location: [facility];  Service: Gastroenterology;  Laterality: Bilateral;   FOOT SURGERY     Groin Abscess     HAND SURGERY     KNEE SURGERY Bilateral    LABIAL ABSCESS     LUMBAR LAMINECTOMY/DECOMPRESSION MICRODISCECTOMY Left 05/11/2018   Procedure: LUMBAR LAMINECTOMY/DECOMPRESSION MICRODISCECTOMY 1 LEVEL- L4-5;  Surgeon: Bluford Standing, MD;  Location: [facility];  Service: Neurosurgery;  Laterality: Left;   LUMBAR LAMINECTOMY/DECOMPRESSION MICRODISCECTOMY Left 05/20/2022   Procedure: MICRODISCECTOMY L3-4;  Surgeon: Mavis Purchase, MD;  Location: [facility] OR;  Service: Neurosurgery;  Laterality: Left;  3C   LUMBAR WOUND DEBRIDEMENT N/A 08/08/2022   Procedure: INCISION AND DRAINAGE OF LUMBAR WOUND;  Surgeon: Mavis Purchase, MD;  Location: [facility] OR;  Service: Neurosurgery;  Laterality: N/A;  3C   OOPHORECTOMY     BSO   PUBO VAG SLING     SHOULDER SURGERY     bilateral arthroscopies   TEE WITHOUT CARDIOVERSION N/A 07/25/2023   Procedure: TRANSESOPHAGEAL ECHOCARDIOGRAM (TEE);  Surgeon: Hilarie Rocher, MD;  Location: [facility];   Service: Cardiovascular;  Laterality: N/A;   TEE WITHOUT CARDIOVERSION N/A 12/08/2023   Procedure: ECHOCARDIOGRAM, TRANSESOPHAGEAL;  Surgeon: Wilburn Keller BROCKS, MD;  Location: [facility];  Service: Cardiovascular;  Laterality: N/A;  TEE   VAGINAL HYSTERECTOMY  1979    Medications Prior to Admission  Medication Sig Dispense Refill Last Dose/Taking   acetaminophen  (TYLENOL ) 500 MG tablet Take 1,000 mg by mouth every 6 (six) hours as needed for moderate pain.   Unknown   albuterol  (VENTOLIN  HFA) 108 (90 Base) MCG/ACT inhaler Inhale 2 puffs into the lungs every 6 (six) hours as needed for wheezing or shortness of breath.   Unknown   aspirin  EC 81 MG tablet Take 81 mg by mouth at bedtime. Swallow whole.   Taking   calcitRIOL  (ROCALTROL ) 0.25 MCG capsule Take 0.25 mcg by mouth at bedtime.   Taking   cholecalciferol (VITAMIN D3) 25 MCG (1000 UNIT) tablet Take 1,000 Units by mouth daily.   Taking   cyclobenzaprine  (FLEXERIL ) 10 MG tablet Take 1 tablet (10 mg total) by mouth 3 (three) times daily as needed for muscle spasms. 30 tablet 0 Taking As Needed   D-MANNOSE PO Take 2 tablets by mouth at bedtime.   Taking  diazepam  (VALIUM ) 5 MG tablet Take 5 mg by mouth 2 (two) times daily as needed.   Taking As Needed   diphenhydrAMINE (BENADRYL) 25 MG tablet Take 50 mg by mouth at bedtime as needed for sleep.   Taking As Needed   docusate sodium  (COLACE) 100 MG capsule Take 1 capsule (100 mg total) by mouth 2 (two) times daily. (Patient taking differently: Take 100 mg by mouth at bedtime as needed for mild constipation or moderate constipation.) 30 capsule 0 Taking Differently   estradiol  (ESTRACE ) 0.1 MG/GM vaginal cream Place 1 Applicatorful vaginally 3 (three) times a week.   Taking   Fe Fum-Vit C-Vit B12-FA (TRIGELS-F FORTE) CAPS capsule Take 1 capsule by mouth daily after breakfast. 90 capsule 0 Taking   insulin  glargine-yfgn (SEMGLEE ) 100 UNIT/ML Pen Inject 10 Units into the skin 2 (two) times daily.   Taking    losartan  (COZAAR ) 100 MG tablet Take 1 tablet (100 mg total) by mouth daily. (Patient taking differently: Take 100 mg by mouth at bedtime.) 30 tablet 2 Taking Differently   metoprolol  succinate (TOPROL -XL) 100 MG 24 hr tablet Take 100 mg by mouth at bedtime.   Taking   Multiple Vitamins-Minerals (PRESERVISION AREDS 2+MULTI VIT PO) Take 1 capsule by mouth daily.   Taking   nitrofurantoin  (MACRODANTIN ) 100 MG capsule Take 100 mg by mouth at bedtime.   Taking   rosuvastatin  (CRESTOR ) 5 MG tablet Take 5 mg by mouth daily.   Taking   sennosides-docusate sodium  (SENOKOT-S) 8.6-50 MG tablet Take 1 tablet by mouth daily as needed for constipation.   Taking As Needed   sodium bicarbonate  650 MG tablet Take 1 tablet (650 mg total) by mouth 2 (two) times daily. (Patient taking differently: Take 650 mg by mouth daily.) 100 tablet 1 Taking Differently   sucralfate  (CARAFATE ) 1 g tablet Take 1 tablet (1 g total) by mouth 2 (two) times daily as needed (acid reflux).   Taking As Needed   Vibegron (GEMTESA) 75 MG TABS Take 75 mg by mouth at bedtime.   Taking   zolpidem  (AMBIEN ) 10 MG tablet Take 10 mg by mouth at bedtime as needed for sleep.   Taking As Needed   amLODipine  (NORVASC ) 10 MG tablet Take 1 tablet (10 mg total) by mouth daily. (Patient not taking: Reported on 11/28/2023) 30 tablet 2 Not Taking   BD INSULIN  SYRINGE U/F 31G X 5/16 1 ML MISC USE 1 SYRINGE AS DIRECTED      Continuous Glucose Sensor (FREESTYLE LIBRE 2 SENSOR) MISC       epoetin  alfa-epbx (RETACRIT ) 4000 UNIT/ML injection Inject 1 mL (4,000 Units total) into the skin once a week. (Patient not taking: Reported on 11/28/2023) 4 mL 3 Not Taking   feeding supplement (BOOST HIGH PROTEIN) LIQD Take 1 Container by mouth 2 (two) times daily between meals.      insulin  detemir (LEVEMIR ) 100 UNIT/ML injection Inject 100-120 Units into the skin daily. (Patient not taking: Reported on 11/28/2023)   Not Taking   nystatin  (MYCOSTATIN ) 100000 UNIT/ML  suspension Take 5 mLs (500,000 Units total) by mouth 4 (four) times daily. (Patient not taking: Reported on 11/28/2023) 120 mL 1 Not Taking   ONETOUCH ULTRA test strip 4 (four) times daily.      Social History   Socioeconomic History   Marital status: Married    Spouse name: Not on file   Number of children: 1   Years of education: Not on file   Highest education level:  Not on file  Occupational History   Not on file  Tobacco Use   Smoking status: Every Day    Current packs/day: 1.50    Average packs/day: 1.5 packs/day for 50.0 years (75.0 ttl pk-yrs)    Types: Cigarettes   Smokeless tobacco: Never   Tobacco comments:    3ppd x 10 year, then cut back to 1.5pdd since 02/2015  Vaping Use   Vaping status: Never Used  Substance and Sexual Activity   Alcohol use: Never    Alcohol/week: 0.0 standard drinks of alcohol   Drug use: No   Sexual activity: Not Currently    Birth control/protection: Surgical    Comment: Hx Hysterectomy, 1st intercourse 78 yo-Fewer than 5 partners  Other Topics Concern   Not on file  Social History Narrative   Lives at home with husband in a one story home.  Has 1 daughter.     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     Education: some college.   Social Drivers of Corporate investment banker Strain: Low Risk  (10/31/2023)   Received from [facility]   Overall Financial Resource Strain (CARDIA)    Difficulty of Paying Living Expenses: Not hard at all  Food Insecurity: No Food Insecurity (11/28/2023)   Hunger Vital Sign    Worried About Running Out of Food in the Last Year: Never true    Ran Out of Food in the Last Year: Never true  Transportation Needs: No Transportation Needs (11/28/2023)   PRAPARE - Administrator, Civil Service (Medical): No    Lack of Transportation (Non-Medical): No  Physical Activity: Not on file  Stress: Not on file  Social Connections: Unknown (11/28/2023)   Social Connection and  Isolation Panel    Frequency of Communication with Friends and Family: More than three times a week    Frequency of Social Gatherings with Friends and Family: More than three times a week    Attends Religious Services: More than 4 times per year    Active Member of [employer] or Organizations: Not on file    Attends Banker Meetings: Not on file    Marital Status: Not on file  Intimate Partner Violence: Not At Risk (11/28/2023)   Humiliation, Afraid, Rape, and Kick questionnaire    Fear of Current or Ex-Partner: No    Emotionally Abused: No    Physically Abused: No    Sexually Abused: No    Family History  Problem Relation Age of Onset   Hypertension Mother    Heart disease Mother 72       MI   Diabetes Father    Diabetes Sister    Hypertension Sister    Diabetes Brother    Hypertension Brother    Heart disease Brother 27       CAD   Cancer Sister        Multiple myloma   Diabetes Brother      Vitals:   12/11/23 0900 12/11/23 0930 12/11/23 1000 12/11/23 1030  BP: (!) 128/115 (!) 114/55 122/64 (!) 111/92  Pulse: 95 88 84 (!) 46  Resp: (!) 23 (!) 30 (!) 22 (!) 25  Temp:      TempSrc:      SpO2: 100% 100% 100% 100%  Weight:      Height:        PHYSICAL EXAM General: well appearing elderly female, well nourished, in no acute distress. HEENT:  Normocephalic and atraumatic. Neck: No JVD.   Lungs: Normal respiratory effort on room air. Diminished breath sounds bilaterally, bibasilar crackles.  Heart: HRRR. Normal S1 and S2, + murmur  Abdomen: Non-distended appearing.  Msk: Normal strength and tone for age. Extremities: Warm and well perfused. No clubbing, cyanosis. Trace pedal edema bilaterally.  Neuro: Alert and oriented X 3. Psych: Answers questions appropriately.   Labs: Basic Metabolic Panel: Recent Labs    12/10/23 1400 12/11/23 0811  NA 127* 132*  K 3.6 3.7  CL 91* 96*  CO2 24 27  GLUCOSE 238* 222*  BUN 51* 28*  CREATININE 4.46* 2.71*  CALCIUM   7.8* 8.0*  PHOS 5.4* 3.7   Liver Function Tests: Recent Labs    12/10/23 1400 12/11/23 0811  ALBUMIN 1.7* 1.8*    No results for input(s): LIPASE, AMYLASE in the last 72 hours. CBC: Recent Labs    12/10/23 0326 12/11/23 0336  WBC 13.8* 10.6*  HGB 6.3* 7.1*  HCT 19.8* 22.2*  MCV 92.5 92.5  PLT 212 218   Cardiac Enzymes: No results for input(s): CKTOTAL, CKMB, CKMBINDEX, TROPONINIHS in the last 72 hours. BNP: Recent Labs    12/09/23 0508  BNP 1,699.6*   D-Dimer: No results for input(s): DDIMER in the last 72 hours. Hemoglobin A1C: No results for input(s): HGBA1C in the last 72 hours. Fasting Lipid Panel: No results for input(s): CHOL, HDL, LDLCALC, TRIG, CHOLHDL, LDLDIRECT in the last 72 hours. Thyroid Function Tests: Recent Labs    12/09/23 0509  TSH 2.099   Anemia Panel: No results for input(s): VITAMINB12, FOLATE, FERRITIN, TIBC, IRON, RETICCTPCT in the last 72 hours.   Radiology: PERIPHERAL VASCULAR CATHETERIZATION Result Date: 12/10/2023 See surgical note for result.  ECHO TEE Result Date: 12/08/2023    TRANSESOPHOGEAL ECHO REPORT   Patient Name:   RANDALL RAMPERSAD Date of Exam: 12/08/2023 Medical Rec #:  [id number]      Height:       67.0 in Accession #:    [id number]     Weight:       173.7 lb Date of Birth:  Feb 04, 1946      BSA:          1.905 m Patient Age:    78 years       BP:           131/52 mmHg Patient Gender: F              HR:           69 bpm. Exam Location:  [facility] Procedure: Transesophageal Echo, Cardiac Doppler, Color Doppler and 3D Echo            (Both Spectral and Color Flow Doppler were utilized during            procedure). Indications:     Bacteremia R78.81  History:         Patient has prior history of Echocardiogram examinations, most                  recent 12/07/2023. Risk Factors:Hypertension and Sleep Apnea.  Sonographer:     Christopher Furnace Referring Phys:  [id number] DWAYNE D CALLWOOD Diagnosing Phys: Keller Paterson PROCEDURE: After discussion of the risks and benefits of a TEE, an informed consent was obtained from the patient. The transesophogeal probe was passed without difficulty through the esophogus of the patient. Local oropharyngeal anesthetic was provided with Cetacaine . Sedation performed by different physician. The patient was monitored while  under deep sedation. Image quality was excellent. The patient's vital signs; including heart rate, blood pressure, and oxygen saturation; remained stable throughout the procedure. The patient developed no complications during the procedure.  IMPRESSIONS  1. Left ventricular ejection fraction, by estimation, is 50 to 55%. The left ventricle has low normal function.  2. Right ventricular systolic function is normal. The right ventricular size is normal.  3. No left atrial/left atrial appendage thrombus was detected.  4. The mitral valve is normal in structure. Trivial mitral valve regurgitation.  5. The aortic valve is tricuspid. There is mild thickening of the aortic valve. Aortic valve regurgitation is not visualized.  6. 3D performed of the mitral valve and demonstrates no valvular vegetation. Conclusion(s)/Recommendation(s): No evidence of vegetation/infective endocarditis on this transesophageael echocardiogram. FINDINGS  Left Ventricle: Left ventricular ejection fraction, by estimation, is 50 to 55%. The left ventricle has low normal function. The left ventricular internal cavity size was normal in size. Right Ventricle: The right ventricular size is normal. No increase in right ventricular wall thickness. Right ventricular systolic function is normal. Left Atrium: Left atrial size was normal in size. No left atrial/left atrial appendage thrombus was detected. Right Atrium: Right atrial size was normal in size. Pericardium: There is no evidence of pericardial effusion. Mitral Valve: The mitral valve is normal in structure. Trivial mitral valve regurgitation. Tricuspid  Valve: The tricuspid valve is normal in structure. Tricuspid valve regurgitation is trivial. Aortic Valve: The aortic valve is tricuspid. There is mild thickening of the aortic valve. Aortic valve regurgitation is not visualized. Pulmonic Valve: The pulmonic valve was normal in structure. Pulmonic valve regurgitation is trivial. Aorta: The aortic root is normal in size and structure. IAS/Shunts: No atrial level shunt detected by color flow Doppler. Additional Comments: 3D was performed not requiring image post processing on an independent workstation and was normal. Keller Paterson Electronically signed by Keller Paterson Signature Date/Time: 12/08/2023/5:20:23 PM    Final    ECHOCARDIOGRAM LIMITED Result Date: 12/07/2023    ECHOCARDIOGRAM LIMITED REPORT   Patient Name:   YUKO COVENTRY Date of Exam: 12/07/2023 Medical Rec #:  [id number]      Height:       67.0 in Accession #:    [id number]     Weight:       171.1 lb Date of Birth:  1946-03-17      BSA:          1.892 m Patient Age:    78 years       BP:           147/56 mmHg Patient Gender: F              HR:           62 bpm. Exam Location:  [facility] Procedure: Limited Echo (Both Spectral and Color Flow Doppler were utilized            during procedure). Indications:     Bacteremia R78.81  History:         Patient has prior history of Echocardiogram examinations, most                  recent 11/29/2023.  Sonographer:     Thedora Louder RDCS, FASE Referring Phys:  [phone] DWAYNE D CALLWOOD Diagnosing Phys: Cara JONETTA Lovelace MD  Sonographer Comments: Limited 2D echo requested no spectral or color flow doppler performed on this exam. IMPRESSIONS  1. Aotic valve valve slightly thickened.  2. Mitral  valve wth mild thickening.  3. No obvious vegataion seen.  4. Recommed TEE.  5. Left ventricular ejection fraction, by estimation, is 60 to 65%. The left ventricle has normal function. The left ventricle has no regional wall motion abnormalities. Left ventricular diastolic  function could not be evaluated.  6. Right ventricular systolic function is normal. The right ventricular size is normal.  7. The mitral valve is normal in structure.  8. The aortic valve is grossly normal. Aortic valve sclerosis is present, with no evidence of aortic valve stenosis. Conclusion(s)/Recommendation(s): Poor windows for evaluation of left ventricular function by transthoracic echocardiography. Would recommend an alternative means of evaluation. No evidence of valvular vegetations on this transthoracic echocardiogram. Consider a transesophageal echocardiogram to exclude infective endocarditis if clinically indicated. FINDINGS  Left Ventricle: Left ventricular ejection fraction, by estimation, is 60 to 65%. The left ventricle has normal function. The left ventricle has no regional wall motion abnormalities. The left ventricular internal cavity size was normal in size. There is  no left ventricular hypertrophy. Left ventricular diastolic function could not be evaluated. Right Ventricle: The right ventricular size is normal. No increase in right ventricular wall thickness. Right ventricular systolic function is normal. Left Atrium: Left atrial size was normal in size. Right Atrium: Right atrial size was normal in size. Pericardium: There is no evidence of pericardial effusion. Mitral Valve: The mitral valve is normal in structure. There is mild thickening of the mitral valve leaflet(s). There is mild calcification of the mitral valve leaflet(s). Normal mobility of the mitral valve leaflets. Tricuspid Valve: The tricuspid valve is normal in structure. Aortic Valve: The aortic valve is grossly normal. Aortic valve sclerosis is present, with no evidence of aortic valve stenosis. Pulmonic Valve: The pulmonic valve was normal in structure. Aorta: The ascending aorta was not well visualized. Additional Comments: No obvious vegataion seen. Aotic valve valve slightly thickened. Mitral valve wth mild thickening.  Recommed TEE.  Dwayne D Callwood MD Electronically signed by Cara JONETTA Lovelace MD Signature Date/Time: 12/07/2023/1:28:01 PM    Final    DG Chest Port 1 View Result Date: 12/04/2023 CLINICAL DATA:  Fever EXAM: PORTABLE CHEST 1 VIEW COMPARISON:  11/28/2023 FINDINGS: Right internal jugular hemodialysis catheter tips are seen within the superior vena cava. Lungs appear mildly hyperinflated, stable since prior examination, suggesting changes of underlying COPD. Small right pleural effusion again noted, slightly decreased since prior examination. Focal atelectasis or infiltrate within the right costophrenic angle. Mild diffuse interstitial thickening is again noted in keeping with interstitial pulmonary edema or airway inflammation. No pneumothorax. Stable cardiomegaly. No acute bone abnormality. IMPRESSION: 1. Stable cardiomegaly. 2. Small right pleural effusion, slightly decreased since prior examination. 3. Stable interstitial thickening in keeping with interstitial pulmonary edema or airway inflammation. Electronically Signed   By: Dorethia Molt M.D.   On: 12/04/2023 20:43   PERIPHERAL VASCULAR CATHETERIZATION Result Date: 12/01/2023 See surgical note for result.  ECHOCARDIOGRAM COMPLETE Result Date: 11/29/2023    ECHOCARDIOGRAM REPORT   Patient Name:   VERNEICE CASPERS Date of Exam: 11/29/2023 Medical Rec #:  [id number]      Height:       67.0 in Accession #:    [id number]     Weight:       176.6 lb Date of Birth:  1945-11-25      BSA:          1.918 m Patient Age:    78 years       BP:  163/51 mmHg Patient Gender: F              HR:           81 bpm. Exam Location:  [facility] Procedure: 2D Echo, Cardiac Doppler and Color Doppler (Both Spectral and Color            Flow Doppler were utilized during procedure). Indications:     CHF I50.31  History:         Patient has prior history of Echocardiogram examinations, most                  recent 07/23/2023.  Sonographer:     Thedora Louder RDCS, FASE Referring  Phys:  [id number] ANTHONY CHRISTELLA POUCH Diagnosing Phys: Dwayne D Callwood MD IMPRESSIONS  1. Left ventricular ejection fraction, by estimation, is 55 to 60%. The left ventricle has normal function. The left ventricle has no regional wall motion abnormalities. The left ventricular internal cavity size was moderately dilated. Left ventricular diastolic parameters are consistent with Grade II diastolic dysfunction (pseudonormalization).  2. Right ventricular systolic function is normal. The right ventricular size is normal.  3. Left atrial size was mildly dilated.  4. The mitral valve is normal in structure. Trivial mitral valve regurgitation.  5. The aortic valve is normal in structure. Aortic valve regurgitation is not visualized. FINDINGS  Left Ventricle: Left ventricular ejection fraction, by estimation, is 55 to 60%. The left ventricle has normal function. The left ventricle has no regional wall motion abnormalities. Strain was performed and the global longitudinal strain is indeterminate. The left ventricular internal cavity size was moderately dilated. There is no left ventricular hypertrophy. Left ventricular diastolic parameters are consistent with Grade II diastolic dysfunction (pseudonormalization). Right Ventricle: The right ventricular size is normal. No increase in right ventricular wall thickness. Right ventricular systolic function is normal. Left Atrium: Left atrial size was mildly dilated. Right Atrium: Right atrial size was normal in size. Pericardium: There is no evidence of pericardial effusion. Mitral Valve: The mitral valve is normal in structure. Trivial mitral valve regurgitation. Tricuspid Valve: The tricuspid valve is normal in structure. Tricuspid valve regurgitation is mild. Aortic Valve: The aortic valve is normal in structure. Aortic valve regurgitation is not visualized. Aortic valve peak gradient measures 8.8 mmHg. Pulmonic Valve: The pulmonic valve was normal in structure. Pulmonic valve  regurgitation is not visualized. Aorta: The ascending aorta was not well visualized. IAS/Shunts: No atrial level shunt detected by color flow Doppler. Additional Comments: 3D was performed not requiring image post processing on an independent workstation and was indeterminate.  LEFT VENTRICLE PLAX 2D LVIDd:         5.50 cm     Diastology LVIDs:         3.80 cm     LV e' medial:    7.18 cm/s LV PW:         1.00 cm     LV E/e' medial:  19.9 LV IVS:        1.10 cm     LV e' lateral:   8.38 cm/s LVOT diam:     1.80 cm     LV E/e' lateral: 17.1 LV SV:         63 LV SV Index:   33 LVOT Area:     2.54 cm  LV Volumes (MOD) LV vol d, MOD A2C: 74.1 ml LV vol d, MOD A4C: 70.9 ml LV vol s, MOD A2C: 37.0 ml LV vol s, MOD  A4C: 28.8 ml LV SV MOD A2C:     37.1 ml LV SV MOD A4C:     70.9 ml LV SV MOD BP:      41.0 ml RIGHT VENTRICLE RV Basal diam:  3.20 cm RV S prime:     14.80 cm/s TAPSE (M-mode): 2.6 cm LEFT ATRIUM             Index        RIGHT ATRIUM           Index LA diam:        4.30 cm 2.24 cm/m   RA Area:     19.70 cm LA Vol (A2C):   44.9 ml 23.41 ml/m  RA Volume:   55.40 ml  28.88 ml/m LA Vol (A4C):   53.8 ml 28.05 ml/m LA Biplane Vol: 51.9 ml 27.06 ml/m  AORTIC VALVE                 PULMONIC VALVE AV Area (Vmax): 1.84 cm     PV Vmax:        1.01 m/s AV Vmax:        148.00 cm/s  PV Peak grad:   4.1 mmHg AV Peak Grad:   8.8 mmHg     RVOT Peak grad: 3 mmHg LVOT Vmax:      107.00 cm/s LVOT Vmean:     76.000 cm/s LVOT VTI:       0.249 m  AORTA Ao Root diam: 2.90 cm MITRAL VALVE MV Area (PHT): 4.86 cm     SHUNTS MV Decel Time: 156 msec     Systemic VTI:  0.25 m MV E velocity: 143.00 cm/s  Systemic Diam: 1.80 cm MV A velocity: 80.30 cm/s MV E/A ratio:  1.78 Dwayne D Callwood MD Electronically signed by Cara JONETTA Lovelace MD Signature Date/Time: 11/29/2023/10:33:04 AM    Final    US  Venous Img Lower Bilateral Result Date: 11/28/2023 CLINICAL DATA:  New onset left greater than right lower extremity swelling, without  findings of CHF. EXAM: BILATERAL LOWER EXTREMITY VENOUS DOPPLER ULTRASOUND TECHNIQUE: Gray-scale sonography with graded compression, as well as color Doppler and duplex ultrasound were performed to evaluate the lower extremity deep venous systems from the level of the common femoral vein and including the common femoral, femoral, profunda femoral, popliteal and calf veins including the posterior tibial, peroneal and gastrocnemius veins when visible. The superficial great saphenous vein was also interrogated. Spectral Doppler was utilized to evaluate flow at rest and with distal augmentation maneuvers in the common femoral, femoral and popliteal veins. COMPARISON:  Left lower extremity DVT exam 08/26/2019. FINDINGS: RIGHT LOWER EXTREMITY Common Femoral Vein: No evidence of thrombus. Normal compressibility, respiratory phasicity and response to augmentation. Saphenofemoral Junction: No evidence of thrombus. Normal compressibility and flow on color Doppler imaging. Profunda Femoral Vein: No evidence of thrombus. Normal compressibility and flow on color Doppler imaging. Femoral Vein: No evidence of thrombus. Normal compressibility, respiratory phasicity and response to augmentation. Popliteal Vein: No evidence of thrombus. Normal compressibility, respiratory phasicity and response to augmentation. Calf Veins: No evidence of thrombus. Normal compressibility and flow on color Doppler imaging. Superficial Great Saphenous Vein: No evidence of thrombus. Normal compressibility. Venous Reflux:  None. Other Findings:  Pulsatile venous waveforms. LEFT LOWER EXTREMITY Common Femoral Vein: No evidence of thrombus. Normal compressibility, respiratory phasicity and response to augmentation. Saphenofemoral Junction: No evidence of thrombus. Normal compressibility and flow on color Doppler imaging. Profunda Femoral Vein: No evidence of thrombus. Normal  compressibility and flow on color Doppler imaging. Femoral Vein: No evidence of  thrombus. Normal compressibility, respiratory phasicity and response to augmentation. Popliteal Vein: No evidence of thrombus. Normal compressibility, respiratory phasicity and response to augmentation. Calf Veins: No evidence of thrombus. Normal compressibility and flow on color Doppler imaging. Superficial Great Saphenous Vein: No evidence of thrombus. Normal compressibility. Venous Reflux:  None. Other Findings:  Pulsatile venous waveforms. IMPRESSION: 1. No evidence of deep venous thrombosis in either lower extremity. 2. Pulsatile venous waveforms, which can be seen with right heart failure or tricuspid regurgitation. Electronically Signed   By: Francis Quam M.D.   On: 11/28/2023 04:56   DG Chest Portable 1 View Result Date: 11/28/2023 CLINICAL DATA:  Difficulty breathing EXAM: PORTABLE CHEST 1 VIEW COMPARISON:  07/19/2023 FINDINGS: Cardiac shadow is mildly enlarged. Aortic calcifications are seen. Mild vascular congestion is noted. Small right-sided pleural effusion is noted. Metallic densities are noted symmetrically over the upper chest likely artifactual in nature. IMPRESSION: Small right pleural effusion and vascular congestion. Electronically Signed   By: Oneil Devonshire M.D.   On: 11/28/2023 02:59    ECHO as above  TELEMETRY reviewed by me 12/11/2023: Atrial fibrillation, rate 90s to 100s. (This AM) Sinus rhythm, rate 60s (afternoon, converted around 12PM)  EKG reviewed by me: Normal sinus rhythm, mild right axis nonspecific ST-T wave changes   Data reviewed by me 12/11/2023: last 24h vitals tele labs imaging I/O hospitalist progress note, nephrology notes.  Principal Problem:   Hypertensive urgency/emergency Active Problems:   Hyponatremia   CAD (coronary artery disease)   COPD with acute exacerbation (HCC)   Acute renal failure superimposed on stage 4 chronic kidney disease (HCC)   MRSA (methicillin resistant Staphylococcus aureus) septicemia (HCC)   Chest pain   Hypertensive  emergency   Acute dyspnea   Encounter for dialysis catheter care (HCC)   Paroxysmal atrial fibrillation (HCC)   ESRD (end stage renal disease) (HCC)   Acute on chronic diastolic CHF (congestive heart failure) (HCC)    ASSESSMENT AND PLAN:  PETRONA WYETH is a 78 y.o. female  with a past medical history of coronary artery disease, hypertension, hyperlipidemia, COPD, OSA, hx CVA, CKD stage 4, SIADH, diabetes mellitus who presented to the ED on 11/28/2023 for worsening and persistent SOB and lower extremity swelling with concurrent COPD exacerbation and found elevated BNP of 2500. Cardiology was consulted for further evaluation.   # New AF RVR Patient developed AF RVR 140s early this morning (06/23). Per tele s/p IV dilt 10 mg HF improved to 110s-120s. Patient converted to SR with IV dilt gtt on 06/23. Per tele back in atrial fibrillation with rate 90-100s (06/26). Converted to SR with rates in 60s on 06/26 at 12PM. Now remaining in sinus rhythm. Patient reports 2 episodes of nose bleeds on 06/26.  -Ordered p.o. Amio 400 mg twice daily for 10 days, then 200 mg daily for rhythm control -Continue diltiazem to 120 mg daily. -Continue Eliquis 2.5 mg BID for stroke risk reduction.  -CHA2DS2VASc score of 9  (in the setting of age, sex, CHF, HTN, Stroke history, vascular disease, and diabetes history).   # MRSA bacteremia Blood cultures positive for MRSA. TEE (06/23) revealed no evidence of vegetation/infective Endocarditis.Repeat blood cultures with no growth in 2 days. -IV broad spectrum antibiotics per primary team.  -Infectious disease following, appreciate recommendations.  # ESRD (Plan to start dialysis on 06/16) Permacath placement on 06/17 by Dr. Marea. Patient underwent dialysis, tolerated well. Permacath  removed due to positive MRSA blood cultures.  Repeat blood cultures have shown no growth for the past 4 days.  Replaced permacath (06/25).  Patient undergoing hemodialysis today. -Nephrology  following, appreciate recommendations.   # Acute HFpEF exacerbation # COPD exacerbation  # OSA Patient presents with worsening SOB and LE swelling. BNP elevated at 2450. Echo this admission with pEF, no RWMA, grade II diastolic dysfunction.  -Continue metoprolol  succinate 100 mg daily.  -Continue losartan  as stated below.  -Fluid removal via dialysis.  -GDMT optimization limited due to ESRD.  -Recommend sleep study for OSA if CPAP indicated.  -Recommend breathing treatments and frequent use of incentive spirometry for COPD. Management per primary. -Recommend pulmonology consultation.   # Coronary artery disease # Hypertension # Hyperlipidemia # Demand ischemia Patient without chest pain. Trops minimally elevated and flat 19 > 20. EKG without acute ischemic changes. Echo with no RWMA.   -Continue ASA 81 mg, rosuvastatin  5 mg daily -Continue losartan  100 mg daily. -Continue clonidine  0.2 mg BID.  -Continue metoprolol  as stated above. -Minimally elevated and flat trop sin setting of ESRD, heart failure exacerbation is most consistent with demand/supply mismatch and not ACS.  Will arrange for follow up in clinic with Dr. Ammon in 1-2 weeks.   This patient's plan of care was discussed and created with Dr. Florencio and he is in agreement.  Signed: Dorene Comfort, PA-C  12/11/2023, 11:09 AM [facility]

## 2023-12-11 NOTE — Progress Notes (Signed)
 Hemodialysis Note:  Received patient in bed to unit. Alert and oriented. Informed consent singed and in chart.  Treatment initiated: 0815 Treatment completed: 1117  Access used: Right internal jugular catheter Access issues: None  Patient tolerated well. Transported back to room, alert without acute distress. Report given to patient's RN.  Total UF removed: 1 liter Medications given: Retacrit  4000 units IV, Vancomycin  1.5 gm IV  Post HD weight: 73.6 Kg  [facility]

## 2023-12-11 NOTE — Progress Notes (Signed)
 Nutrition Follow-up  DOCUMENTATION CODES:   Not applicable  INTERVENTION:   -Continue regular diet -Continue renal MVI daily -Continue Boost Plus TID, each supplement provides 360 kcals and 14 grams protein  NUTRITION DIAGNOSIS:   Increased nutrient needs related to chronic illness (ESRD on HD) as evidenced by estimated needs.  Ongoing  GOAL:   Patient will meet greater than or equal to 90% of their needs  Unmet  MONITOR:   PO intake, Supplement acceptance  REASON FOR ASSESSMENT:   Consult Diet education  ASSESSMENT:   Pt with medical history significant for COPD, CVA, stage 4 CKD, SIADH, esophageal dysphagia treated with Botox  (last in 05/2023), DM, HLD, and HTN, last hospitalized from 2/1 to 07/26/2023 with MRSA bacteremia/sepsis/multifocal pneumonia, now presenting with shortness of breath and chest pressure.  6/16- s/p rt internal jugular HD cath placement, first HD treatment 6/18- second HD session 6/19- third HD treatment 6/21- rt permacath removed secondary to bacteremia 6/25- s/p Procedure(s): DIALYSIS/PERMA CATHETER INSERTION (N/A)   Reviewed I/O's: -638 ml x 24 hours and -3.1 L since admission  Last HD treatment today. Plan another HD treatment tomorrow to maintain outpatient schedule.   No meal intake available since last visit. Pt also refusing Boost supplements and some medications. Pt still with no bowel movement, but refusing bowel regimen.    No wt loss noted over the past week.   Per TOC notes, now plan for SNF placement at discharge.   Medications reviewed and include miralax .   Labs reviewed: Na: 132, CBGS: 152-263 (inpatient orders for glycemic control are 0-5 units insulin  aspart daily at bedtime, 0-9 units insulin  aspart TID with meals, 3 units insulin  aspart TID with meals, and 10 units insulin  glargine-yfgn daily).    Diet Order:   Diet Order             Diet renal with fluid restriction Fluid restriction: 1200 mL Fluid; Room service  appropriate? Yes; Fluid consistency: Thin  Diet effective now                   EDUCATION NEEDS:   Education needs have been addressed  Skin:  Skin Assessment: Reviewed RN Assessment  Last BM:  Unknown  Height:   Ht Readings from Last 1 Encounters:  12/01/23 5' 7 (1.702 m)    Weight:   Wt Readings from Last 1 Encounters:  12/11/23 73.6 kg    Ideal Body Weight:  61.4 kg  BMI:  Body mass index is 25.41 kg/m.  Estimated Nutritional Needs:   Kcal:  1800-2000  Protein:  90-105 grams  Fluid:  1000 ml + UOP    Margery ORN, RD, LDN, CDCES Registered Dietitian III Certified Diabetes Care and Education Specialist If unable to reach this RD, please use RD Inpatient group chat on secure chat between hours of 8am-4 pm daily

## 2023-12-11 NOTE — Progress Notes (Signed)
 Occupational Therapy Treatment Patient Details Name: Mckenzie Lawrence MRN: [id number] DOB: [birth year] Today's Date: 12/11/2023   History of present illness 78 y/o female presented to ED on 11/28/23 for worsening SOB and chest pressure. Admitted for ESRD and acute CHF exacerbation. PMH: CAD, HTN, COPD, OSA, hx of CVA, CKD stage 4, SIADH, T2DM   OT comments  Pt is supine in bed on arrival. Pleasant and agreeable to OT session. She denies pain, reports overall feeling Crappy. Able to sit at EOB with supervision, increased time. Began coughing up sputum, provided washcloth. Noted to have nosebleed from 02. Nurse in to exchange to humidified 02 and provide suction to be used as needed. Pt sat at EOB blowing her nose and coughing for a bit until PT came in to join session. Able to stand from slightly elevated bed height to RW with Min A x2 via pulling on RW despite cues to push up from bed. Pt then ambulated to the door and back using RW with CGA x1-2 for safety without LOB but notable fatigue with minimal activity. She remains far from her baseline and will cont to require skilled acute OT services to maximize her safety and IND to return to PLOF.       If plan is discharge home, recommend the following:  A little help with walking and/or transfers;Help with stairs or ramp for entrance;Assistance with cooking/housework;A lot of help with bathing/dressing/bathroom   Equipment Recommendations  Other (comment) (defer)    Recommendations for Other Services      Precautions / Restrictions Precautions Precautions: Fall Recall of Precautions/Restrictions: Intact Restrictions Weight Bearing Restrictions Per Provider Order: No       Mobility Bed Mobility Overal bed mobility: Needs Assistance Bed Mobility: Supine to Sit, Sit to Supine     Supine to sit: Supervision, HOB elevated, Used rails Sit to supine: Min assist   General bed mobility comments: increasded time to reach EOB d/t weakness and  fatigue post HD; Able to sit EOB extended time while coughing and blowing her nose; Min A to return RLE to bed at end of session    Transfers Overall transfer level: Needs assistance Equipment used: Rolling walker (2 wheels) Transfers: Sit to/from Stand Sit to Stand: Min assist, +2 physical assistance, From elevated surface           General transfer comment: Min A X2 for STS from EOB to RW then ambulated to the door and back and wished to return to supine     Balance Overall balance assessment: Needs assistance Sitting-balance support: Feet supported Sitting balance-Leahy Scale: Good     Standing balance support: Bilateral upper extremity supported, Reliant on assistive device for balance Standing balance-Leahy Scale: Fair Standing balance comment: CGA with RW                           ADL either performed or assessed with clinical judgement   ADL Overall ADL's : Needs assistance/impaired     Grooming: Wash/dry face Grooming Details (indicate cue type and reason): pt blew her nose multiple times, noted to have blood from 02, nurse switch to humidified 02, pt declined ADLs                             Functional mobility during ADLs: Contact guard assist;Rolling walker (2 wheels) General ADL Comments: very limited distances d/t fatigue    Extremity/Trunk Assessment  Vision       Perception     Praxis     Communication Communication Communication: No apparent difficulties   Cognition Arousal: Alert, Lethargic Behavior During Therapy: WFL for tasks assessed/performed                                 Following commands: Intact        Cueing   Cueing Techniques: Verbal cues, Gestural cues  Exercises      Shoulder Instructions       General Comments sp02 stable on RA throughout session at 91% notified nurse that she was left on RA    Pertinent Vitals/ Pain       Pain Assessment Pain Assessment:  No/denies pain  Home Living                                          Prior Functioning/Environment              Frequency  Min 2X/week        Progress Toward Goals  OT Goals(current goals can now be found in the care plan section)  Progress towards OT goals: Progressing toward goals  Acute Rehab OT Goals Patient Stated Goal: improve endurance OT Goal Formulation: With patient/family Time For Goal Achievement: 12/17/23 Potential to Achieve Goals: Good  Plan      Co-evaluation                 AM-PAC OT 6 Clicks Daily Activity     Outcome Measure   Help from another person eating meals?: None Help from another person taking care of personal grooming?: None Help from another person toileting, which includes using toliet, bedpan, or urinal?: A Little Help from another person bathing (including washing, rinsing, drying)?: A Little Help from another person to put on and taking off regular upper body clothing?: None Help from another person to put on and taking off regular lower body clothing?: A Lot 6 Click Score: 20    End of Session Equipment Utilized During Treatment: Rolling walker (2 wheels)  OT Visit Diagnosis: Other abnormalities of gait and mobility (R26.89);Muscle weakness (generalized) (M62.81)   Activity Tolerance Patient tolerated treatment well;Patient limited by fatigue   Patient Left in bed;with call bell/phone within reach;with bed alarm set   Nurse Communication Mobility status        Time: 8547-8484 OT Time Calculation (min): 23 min  Charges: OT General Charges $OT Visit: 1 Visit OT Treatments $Therapeutic Activity: 8-22 mins  Dravyn Severs, OTR/L  12/11/23, 3:46 PM   Alliah Boulanger E Muneeb Veras 12/11/2023, 3:43 PM

## 2023-12-11 NOTE — Progress Notes (Signed)
 PT Cancellation Note  Patient Details Name: Mckenzie Lawrence MRN: [id number] DOB: [birth year]   Cancelled Treatment:    Reason Eval/Treat Not Completed: Patient at procedure or test/unavailable Will re-attempt at later time/date  Casimir Barcellos 12/11/2023, 11:28 AM

## 2023-12-11 NOTE — Progress Notes (Signed)
 Progress Note   Patient: Mckenzie Lawrence FMW:[id number] DOB: 23-Jan-1946 DOA: 11/28/2023     13 DOS: the patient was seen and examined on 12/11/2023   Brief hospital course: [name] is a 78 y.o. female with medical history significant for COPD, CVA, stage 4 CKD, SIADH, esophageal dysphagia treated with Botox  (last in 05/2023), DM, HLD, and HTN, last hospitalized from 2/1 to 07/26/2023 with MRSA bacteremia/sepsis/multifocal pneumonia, now presenting by EMS with shortness of breath and chest pressure.   She has no orthopnea and sleeps on 1 pillow.  She has a chronic congested cough that has not changed and has no fever or chills  BP on arrival 202/74 and tachypneic to 25 saturating at 98% on room air. WBC 11,000 with hemoglobin of 9 which is her baseline. Troponin 19 and BNP 2450 Creatinine 3.59 up from baseline of 2.74 with bicarb 19.  Glucose 207, potassium 5.2, sodium 123 (137 four months ago) EKG with sinus rhythm at 79 Chest x-ray showing small right pleural effusion and vascular congestion Patient treated with DuoNeb and Solu-Medrol  and given a dose of Lasix  Patient was diagnosed with acute on chronic diastolic congestive heart failure, she also had progressed to end-stage renal disease.  She was treated with initially with IV Lasix , then hemodialysis was started on 6/17. Blood culture positive again for MRSA, started on vancomycin .     Principal Problem:   Hypertensive urgency/emergency Active Problems:   COPD with acute exacerbation (HCC)   Acute dyspnea   CAD (coronary artery disease)   Chest pain   Acute renal failure superimposed on stage 4 chronic kidney disease (HCC)   Hyponatremia   MRSA (methicillin resistant Staphylococcus aureus) septicemia (HCC)   Hypertensive emergency   Encounter for dialysis catheter care (HCC)   Paroxysmal atrial fibrillation (HCC)   ESRD (end stage renal disease) (HCC)   Acute on chronic diastolic CHF (congestive heart failure)  (HCC)   Assessment and Plan: MRSA septicemia:  Patient had a fever of 102.4 on 6/19, at the same time, heart rate of 92, leukocytosis of 12.1. Blood culture was positive for MRSA. Hx mrsa bacteremia 2/24 2/2 infected spinal hardware, had recurrent bout of mrsa bacteremia earlier this year no source identified. Has dialysis catheter placed this hospitalization, removed by vascular on 6/21. PIV also exchanged Repeat blood cultures ordered 6/22, ngtd. TTE/TEE negative. ID following.  Planning for vancomycin  for the least 4 weeks, too early to perform another scan of the spine. Condition stable, continue vancomycin  which will be given after each dialysis.   Paroxysmal a-fib:  new overnight 6/22, now back in sinus.  Patient started on Eliquis, started having nasal bleeding.  Decrease dose to 2.5 mg twice a day due to end-stage renal disease.   Hypertensive emergency:  Blood pressure well-controlled.   Acute on chronic diastolic CHF:  w/ elevated BNP, CXR showing vascular congestion, pitting LE edema & dyspnea. Echo shows EF 50-55%, grade II diastolic dysfunction, no regional wall motion abnormalities. Patient on regular dialysis, volume status much better.   COPD exacerbation:  Treated with steroids, condition has stabilized.  However, patient still having significant cough with large amount of mucus.  DuoNeb scheduled. Patient currently on oxygen without documented hypoxemia, will wean off oxygen   Chest pain: w/ hx of CAD. Troponins are minimally elevated.  EKG shows no ischemic changes. Continue on losartan , metoprolol , statin, aspirin . No CP currently    ESRD progressing from [facility] Hyperkalemia secondary to end-stage renal disease. Hyponatremia. Anemia of  chronic kidney disease. : tunneled HD cath placed. Started on scheduled HD 12/02/23.  Permacath placed on 6/25. Patient has significant anemia, hemoglobin dropped down to 6.3 today, received 1 unit PRBC, repeat hemoglobin today 7.6.   Recent iron study in 07/2023 showed a normal iron and B12 level.  No evidence of active bleeding.   Anxiety: severity unknown. Valium  discontinued 6/19    Esophageal dysphagia: s/p botox  via EGD in 08/2022. Aspiration precautions. Continue w/ supportive care   OSA: CPAP qhs   DM2: well controlled at baseline, HbA1c 6.1. sugars labile here, on semglee /ssi   Debility: PT rec is now snf, toc consulted.          Subjective:  Patient still has some cough, significant amount of mucus.  Some short of breath.  On 4 L oxygen, without documented hypoxemia.  Physical Exam: Vitals:   12/11/23 1000 12/11/23 1030 12/11/23 1100 12/11/23 1117  BP: 122/64 (!) 111/92 (!) 131/50 133/66  Pulse: 84 (!) 46 73 (!) 109  Resp: (!) 22 (!) 25 (!) 23 (!) 27  Temp:      TempSrc:      SpO2: 100% 100% 100% 100%  Weight:    73.6 kg  Height:       General exam: Appears calm and comfortable  Respiratory system: Decreased breath sounds. Respiratory effort normal. Cardiovascular system: S1 & S2 heard, RRR. No JVD, murmurs, rubs, gallops or clicks. No pedal edema. Gastrointestinal system: Abdomen is nondistended, soft and nontender. No organomegaly or masses felt. Normal bowel sounds heard. Central nervous system: Alert and oriented. No focal neurological deficits. Extremities: Symmetric 5 x 5 power. Skin: No rashes, lesions or ulcers Psychiatry: Judgement and insight appear normal. Mood & affect appropriate.    Data Reviewed:  Lab results reviewed.  Family Communication: Daughter updated at the bedside.  Disposition: Status is: Inpatient Remains inpatient appropriate because: Severity of disease.     Time spent: 35 minutes  Author: Murvin Mana, MD 12/11/2023 1:10 PM  For on call review [email or web address].

## 2023-12-11 NOTE — TOC Progression Note (Addendum)
 Transition of Care [facility]) - Progression Note    Patient Details  Name: Mckenzie Lawrence MRN: [id number] Date of Birth: June 15, 1946  [facility] CM/SW Contact  Lauraine JAYSON Carpen, LCSW Phone Number: 12/11/2023, 9:22 AM  Clinical Narrative:   Daughter has toured [facility] and would like to consider them as an option as well.  3:15 pm: Daughter provided letter last night that the [facility] requested. She was told that it would not prevent her care from their standpoint. CSW left message for [facility] liaison to notify and see how soon they might have a bed available.  3:23 pm: If [facility] accepts patient, patient's HD schedule will need to switch to MWF.  Expected Discharge Plan and Services                                               Social Determinants of Health (SDOH) Interventions SDOH Screenings   Food Insecurity: No Food Insecurity (11/28/2023)  Housing: Low Risk  (11/28/2023)  Transportation Needs: No Transportation Needs (11/28/2023)  Utilities: Not At Risk (11/28/2023)  Depression (PHQ2-9): Low Risk  (08/05/2023)  Financial Resource Strain: Low Risk  (10/31/2023)   Received from [facility]  Social Connections: Unknown (11/28/2023)  Tobacco Use: High Risk (12/08/2023)    Readmission Risk Interventions    07/22/2023   10:56 AM 08/13/2022    1:04 PM 11/10/2021    2:49 PM  Readmission Risk Prevention Plan  Transportation Screening Complete Complete Complete  PCP or Specialist Appt within 5-7 Days  Complete   PCP or Specialist Appt within 3-5 Days Complete  Complete  Home Care Screening  Complete   Medication Review (RN CM)  Complete   HRI or Home Care Consult Complete  Complete  Social Work Consult for Recovery Care Planning/Counseling Complete  Complete  Palliative Care Screening Not Applicable  Not Applicable  Medication Review Oceanographer) Complete  Complete

## 2023-12-11 NOTE — Inpatient Diabetes Management (Signed)
 Inpatient Diabetes Program Recommendations  AACE/ADA: New Consensus Statement on Inpatient Glycemic Control   Target Ranges:  Prepandial:   less than 140 mg/dL      Peak postprandial:   less than 180 mg/dL (1-2 hours)      Critically ill patients:  140 - 180 mg/dL    Latest Reference Range & Units 12/10/23 07:36 12/10/23 11:29 12/10/23 17:00 12/10/23 20:25  Glucose-Capillary 70 - 99 mg/dL 849 (H) 795 (H) 834 (H) 263 (H)    Review of Glycemic Control  Diabetes history: DM2 Outpatient Diabetes medications: Semglee  10 units BID Current orders for Inpatient glycemic control: Semglee  10 units at bedtime, Novolog  3 units TID with meals, Novolog  0-9 units TID with meals, Novolog  0-5 units QHS  Inpatient Diabetes Program Recommendations:    Insulin : No Novolog  meal coverage insulin  given on 12/10/23. Please administer Novolog  meal coverage as ordered if patient eats at least 50% of meals.  Thanks, Earnie Gainer, RN, MSN, CDCES Diabetes Coordinator Inpatient Diabetes Program [phone] (Team Pager from 8am to 5pm)

## 2023-12-11 NOTE — Progress Notes (Signed)
 Progress Note    12/11/2023 4:15 PM 1 Day Post-Op  Subjective:  Mckenzie Lawrence is a 78 year old female who presented to [facility] emergency department in renal failure.  Patient needed long-term dialysis started for her end-stage renal disease.  A permacatheter was placed by Dr. Selinda Gu on 12/01/2023.  This past weekend on 12/06/2023 patient was deemed to be bacteremic.  A source of her bacteremia could have possibly been her dialysis permacatheter.  Dr. Curry Beat covering for vascular surgery service remove the permacatheter that had been placed.  Patient had new dialysis permacatheter placed yesterday by Dr. Cordella Shawl MD.  Patient is recovering as expected with no complications overnight.  Vitals are remained stable.   Vitals:   12/11/23 1117 12/11/23 1528  BP: 133/66   Pulse: (!) 109   Resp: (!) 27   Temp:    SpO2: 100% 91%   Physical Exam: Cardiac:  RRR, normal S1 and S2.  No rubs clicks gallops or murmurs. Lungs: Nonlabored breathing, clear on auscultation throughout. Incisions: Left chest incision for tunneled IJ dialysis catheter.  Dressings clean dry and intact.  No hematoma seroma noted. Extremities: Palpable pulses throughout all extremities are warm to touch. Abdomen: Positive bowel sounds throughout, soft, nontender nondistended. Neurologic: Alert and oriented x 3, answers questions follows commands.  CBC    Component Value Date/Time   WBC 10.6 (H) 12/11/2023 0336   RBC 2.40 (L) 12/11/2023 0336   HGB 7.6 (L) 12/11/2023 1155   HGB 13.6 12/01/2011 1656   HCT 22.2 (L) 12/11/2023 0336   HCT 40.9 12/01/2011 1656   PLT 218 12/11/2023 0336   PLT 299 12/01/2011 1656   MCV 92.5 12/11/2023 0336   MCV 95 12/01/2011 1656   MCH 29.6 12/11/2023 0336   MCHC 32.0 12/11/2023 0336   RDW 15.1 12/11/2023 0336   RDW 13.1 12/01/2011 1656   LYMPHSABS 0.6 (L) 11/28/2023 0246   MONOABS 0.7 11/28/2023 0246   EOSABS 0.0 11/28/2023 0246   BASOSABS 0.0 11/28/2023 0246    BMET     Component Value Date/Time   NA 132 (L) 12/11/2023 0811   NA 137 12/01/2011 1656   K 3.7 12/11/2023 0811   K 4.3 12/01/2011 1656   CL 96 (L) 12/11/2023 0811   CL 104 12/01/2011 1656   CO2 27 12/11/2023 0811   CO2 23 12/01/2011 1656   GLUCOSE 222 (H) 12/11/2023 0811   GLUCOSE 116 (H) 12/01/2011 1656   BUN 28 (H) 12/11/2023 0811   BUN 12 12/01/2011 1656   CREATININE 2.71 (H) 12/11/2023 0811   CREATININE 1.15 12/01/2011 1656   CALCIUM  8.0 (L) 12/11/2023 0811   CALCIUM  8.5 12/01/2011 1656   GFRNONAA 18 (L) 12/11/2023 0811   GFRNONAA 50 (L) 12/01/2011 1656   GFRAA 43 (L) 05/06/2018 1436   GFRAA 58 (L) 12/01/2011 1656    INR    Component Value Date/Time   INR 1.4 (H) 07/19/2023 2040     Intake/Output Summary (Last 24 hours) at 12/11/2023 1615 Last data filed at 12/11/2023 1200 Gross per 24 hour  Intake --  Output 2075 ml  Net -2075 ml     Assessment/Plan:  78 y.o. female is s/p left IJ tunneled dialysis catheter.  1 Day Post-Op   PLAN Patient underwent insertion of a left IJ tunneled catheter for dialysis yesterday on 12/10/2023.  Patient is recovering as expected.  No complications to note from the catheter itself.  Patient will continue with hemodialysis. Vascular surgery will sign off  this case at this time.  DVT prophylaxis: Heparin  with dialysis   [name] [facility] 12/11/2023 4:15 PM

## 2023-12-11 NOTE — Significant Event (Signed)
 2.16 second pause at 0352. patient denies chest pain. bp 137/49 map 76. hr in the 90s-low 100s.

## 2023-12-11 NOTE — Progress Notes (Signed)
 Physical Therapy Treatment Patient Details Name: Mckenzie Lawrence MRN: [id number] DOB: 06/06/1946 Today's Date: 12/11/2023   History of Present Illness 78 y/o female presented to ED on 11/28/23 for worsening SOB and chest pressure. Admitted for ESRD and acute CHF exacerbation. PMH: CAD, HTN, COPD, OSA, hx of CVA, CKD stage 4, SIADH, T2DM    PT Comments  Patient received sitting edge of bed with OT present in room. Patient is coughing. She is able to stand from elevated bed with min A +2 and ambulated to door and back to bed with RW. Cues needed for safety with use of AD. She is easily fatigued and sat prematurely on bed. Patient is limited by fatigue and weakness and just generally feeling poorly this pm after HD this morning. She will continue to benefit from skilled PT to improve strength, endurance and safety.        If plan is discharge home, recommend the following: A little help with bathing/dressing/bathroom;Assistance with cooking/housework;Assist for transportation;Help with stairs or ramp for entrance;A lot of help with walking and/or transfers   Can travel by private vehicle     Yes  Equipment Recommendations  None recommended by PT    Recommendations for Other Services       Precautions / Restrictions Precautions Precautions: Fall Recall of Precautions/Restrictions: Intact Restrictions Weight Bearing Restrictions Per Provider Order: No     Mobility  Bed Mobility Overal bed mobility: Needs Assistance Bed Mobility: Sit to Supine       Sit to supine: Min assist   General bed mobility comments: Patient received sitting up on side of bed with OT present. Returned to supine with min A    Transfers Overall transfer level: Needs assistance Equipment used: Rolling walker (2 wheels) Transfers: Sit to/from Stand Sit to Stand: Min assist, +2 physical assistance, From elevated surface                Ambulation/Gait Ambulation/Gait assistance: Contact guard  assist Gait Distance (Feet): 25 Feet Assistive device: Rolling walker (2 wheels) Gait Pattern/deviations: Step-through pattern, Decreased step length - right, Decreased step length - left, Decreased stride length, Trunk flexed Gait velocity: decreased     General Gait Details: Patient requires cues to stay close to AD. Patient very fatigued with this distance.   Stairs             Wheelchair Mobility     Tilt Bed    Modified Rankin (Stroke Patients Only)       Balance Overall balance assessment: Needs assistance Sitting-balance support: Feet supported Sitting balance-Leahy Scale: Good     Standing balance support: Bilateral upper extremity supported, During functional activity, Reliant on assistive device for balance Standing balance-Leahy Scale: Fair Standing balance comment: CGA with RW                            Communication Communication Communication: No apparent difficulties  Cognition Arousal: Alert, Lethargic Behavior During Therapy: WFL for tasks assessed/performed   PT - Cognitive impairments: No apparent impairments                         Following commands: Intact      Cueing Cueing Techniques: Verbal cues, Gestural cues  Exercises      General Comments        Pertinent Vitals/Pain Pain Assessment Pain Assessment: No/denies pain    Home Living  Prior Function            PT Goals (current goals can now be found in the care plan section) Acute Rehab PT Goals Patient Stated Goal: to feel better PT Goal Formulation: With patient Time For Goal Achievement: 12/16/23 Potential to Achieve Goals: Good Progress towards PT goals: Progressing toward goals    Frequency    Min 2X/week      PT Plan      Co-evaluation              AM-PAC PT 6 Clicks Mobility   Outcome Measure  Help needed turning from your back to your side while in a flat bed without using  bedrails?: A Little Help needed moving from lying on your back to sitting on the side of a flat bed without using bedrails?: A Little Help needed moving to and from a bed to a chair (including a wheelchair)?: A Little Help needed standing up from a chair using your arms (e.g., wheelchair or bedside chair)?: A Lot Help needed to walk in hospital room?: A Lot Help needed climbing 3-5 steps with a railing? : A Lot 6 Click Score: 15    End of Session Equipment Utilized During Treatment: Gait belt Activity Tolerance: Patient limited by fatigue Patient left: in bed;with call bell/phone within reach;with bed alarm set Nurse Communication: Mobility status PT Visit Diagnosis: Other abnormalities of gait and mobility (R26.89);Muscle weakness (generalized) (M62.81);Difficulty in walking, not elsewhere classified (R26.2)     Time: 8541-8484 PT Time Calculation (min) (ACUTE ONLY): 17 min  Charges:    $Gait Training: 8-22 mins PT General Charges $$ ACUTE PT VISIT: 1 Visit                     Basim Bartnik, PT, GCS 12/11/23,3:34 PM

## 2023-12-11 NOTE — Plan of Care (Signed)
  Problem: Education: Goal: Ability to describe self-care measures that may prevent or decrease complications (Diabetes Survival Skills Education) will improve Outcome: Progressing Goal: Individualized Educational Video(s) Outcome: Progressing   Problem: Coping: Goal: Ability to adjust to condition or change in health will improve Outcome: Progressing   Problem: Fluid Volume: Goal: Ability to maintain a balanced intake and output will improve Outcome: Progressing   Problem: Health Behavior/Discharge Planning: Goal: Ability to identify and utilize available resources and services will improve Outcome: Progressing Goal: Ability to manage health-related needs will improve Outcome: Progressing   Problem: Metabolic: Goal: Ability to maintain appropriate glucose levels will improve Outcome: Progressing   Problem: Nutritional: Goal: Maintenance of adequate nutrition will improve Outcome: Progressing Goal: Progress toward achieving an optimal weight will improve Outcome: Progressing   Problem: Skin Integrity: Goal: Risk for impaired skin integrity will decrease Outcome: Progressing   Problem: Tissue Perfusion: Goal: Adequacy of tissue perfusion will improve Outcome: Progressing   Problem: Education: Goal: Knowledge of General Education information will improve Description: Including pain rating scale, medication(s)/side effects and non-pharmacologic comfort measures Outcome: Progressing   Problem: Health Behavior/Discharge Planning: Goal: Ability to manage health-related needs will improve Outcome: Progressing   Problem: Clinical Measurements: Goal: Ability to maintain clinical measurements within normal limits will improve Outcome: Progressing Goal: Will remain free from infection Outcome: Progressing Goal: Diagnostic test results will improve Outcome: Progressing Goal: Respiratory complications will improve Outcome: Progressing Goal: Cardiovascular complication will  be avoided Outcome: Progressing

## 2023-12-11 NOTE — Plan of Care (Signed)
   Problem: Education: Goal: Knowledge of General Education information will improve Description Including pain rating scale, medication(s)/side effects and non-pharmacologic comfort measures Outcome: Progressing

## 2023-12-12 DIAGNOSIS — A4102 Sepsis due to Methicillin resistant Staphylococcus aureus: Secondary | ICD-10-CM | POA: Diagnosis not present

## 2023-12-12 DIAGNOSIS — I160 Hypertensive urgency: Secondary | ICD-10-CM | POA: Diagnosis not present

## 2023-12-12 DIAGNOSIS — N186 End stage renal disease: Secondary | ICD-10-CM | POA: Diagnosis not present

## 2023-12-12 DIAGNOSIS — N179 Acute kidney failure, unspecified: Secondary | ICD-10-CM | POA: Diagnosis not present

## 2023-12-12 LAB — CULTURE, BLOOD (ROUTINE X 2)
Culture: NO GROWTH
Culture: NO GROWTH
Special Requests: ADEQUATE
Special Requests: ADEQUATE

## 2023-12-12 LAB — CBC
HCT: 23.7 % — ABNORMAL LOW (ref 36.0–46.0)
HCT: 23.9 % — ABNORMAL LOW (ref 36.0–46.0)
Hemoglobin: 7.6 g/dL — ABNORMAL LOW (ref 12.0–15.0)
Hemoglobin: 7.7 g/dL — ABNORMAL LOW (ref 12.0–15.0)
MCH: 29.2 pg (ref 26.0–34.0)
MCH: 29.7 pg (ref 26.0–34.0)
MCHC: 31.8 g/dL (ref 30.0–36.0)
MCHC: 32.5 g/dL (ref 30.0–36.0)
MCV: 91.5 fL (ref 80.0–100.0)
MCV: 91.9 fL (ref 80.0–100.0)
Platelets: 239 K/uL (ref 150–400)
Platelets: 264 10*3/uL (ref 150–400)
RBC: 2.59 MIL/uL — ABNORMAL LOW (ref 3.87–5.11)
RBC: 2.6 MIL/uL — ABNORMAL LOW (ref 3.87–5.11)
RDW: 14.8 % (ref 11.5–15.5)
RDW: 14.9 % (ref 11.5–15.5)
WBC: 12.2 10*3/uL — ABNORMAL HIGH (ref 4.0–10.5)
WBC: 12.3 K/uL — ABNORMAL HIGH (ref 4.0–10.5)
nRBC: 0 % (ref 0.0–0.2)
nRBC: 0 % (ref 0.0–0.2)

## 2023-12-12 LAB — RENAL FUNCTION PANEL
Albumin: 1.8 g/dL — ABNORMAL LOW (ref 3.5–5.0)
Anion gap: 10 (ref 5–15)
BUN: 18 mg/dL (ref 8–23)
CO2: 27 mmol/L (ref 22–32)
Calcium: 8 mg/dL — ABNORMAL LOW (ref 8.9–10.3)
Chloride: 97 mmol/L — ABNORMAL LOW (ref 98–111)
Creatinine, Ser: 2.06 mg/dL — ABNORMAL HIGH (ref 0.44–1.00)
GFR, Estimated: 25 mL/min — ABNORMAL LOW
Glucose, Bld: 165 mg/dL — ABNORMAL HIGH (ref 70–99)
Phosphorus: 2.4 mg/dL — ABNORMAL LOW (ref 2.5–4.6)
Potassium: 3.6 mmol/L (ref 3.5–5.1)
Sodium: 134 mmol/L — ABNORMAL LOW (ref 135–145)

## 2023-12-12 LAB — GLUCOSE, CAPILLARY
Glucose-Capillary: 129 mg/dL — ABNORMAL HIGH (ref 70–99)
Glucose-Capillary: 141 mg/dL — ABNORMAL HIGH (ref 70–99)
Glucose-Capillary: 211 mg/dL — ABNORMAL HIGH (ref 70–99)
Glucose-Capillary: 206 mg/dL — ABNORMAL HIGH (ref 70–99)

## 2023-12-12 LAB — CK: Total CK: 21 U/L — ABNORMAL LOW (ref 38–234)

## 2023-12-12 MED ORDER — IPRATROPIUM-ALBUTEROL 0.5-2.5 (3) MG/3ML IN SOLN
3.0000 mL | Freq: Three times a day (TID) | RESPIRATORY_TRACT | Status: DC
  Administered 2023-12-12 – 2023-12-15 (×9): 3 mL via RESPIRATORY_TRACT
  Filled 2023-12-12 (×9): qty 3

## 2023-12-12 MED ORDER — TORSEMIDE 20 MG PO TABS
40.0000 mg | ORAL_TABLET | Freq: Every day | ORAL | Status: DC
  Administered 2023-12-12 – 2023-12-15 (×4): 40 mg via ORAL
  Filled 2023-12-12 (×5): qty 2

## 2023-12-12 MED ORDER — SENNOSIDES-DOCUSATE SODIUM 8.6-50 MG PO TABS
2.0000 | ORAL_TABLET | Freq: Two times a day (BID) | ORAL | Status: DC
  Administered 2023-12-12 – 2023-12-14 (×4): 2 via ORAL
  Filled 2023-12-12 (×7): qty 2

## 2023-12-12 MED ORDER — LACTULOSE 10 GM/15ML PO SOLN
20.0000 g | Freq: Once | ORAL | Status: AC
  Administered 2023-12-12: 20 g via ORAL
  Filled 2023-12-12: qty 30

## 2023-12-12 NOTE — Progress Notes (Signed)
 [facility]  ROUNDING NOTE   Subjective:  [name] is a 78 y.o.  female with past medical history of hypertension, long-standing diabetes mellitus type 2, lower extremity edema, hyperlipidemia, tobacco abuse, prior history of CVA, COPD, obstructive sleep apnea, lumbar spinal fusion, left upper lobe lung mass who presents with volume overload and increasing shortness of breath.   Update:  Patient seen sitting up in chair Daughter at bedside Preparing to take patient on wheelchair outing Patient seen later in day, sitting up in chair Reports discomfort from permcath dressing site   Objective:  Vital signs in last 24 hours:  Temp:  [98.1 F (36.7 C)-98.6 F (37 C)] 98.3 F (36.8 C) (06/27 1141) Pulse Rate:  [66-75] 66 (06/27 1141) Resp:  [17-19] 17 (06/27 0406) BP: (116-144)/(38-63) 134/45 (06/27 1141) SpO2:  [91 %-100 %] 100 % (06/27 1141) Weight:  [74.7 kg] 74.7 kg (06/27 0500)  Weight change: 2.3 kg Filed Weights   12/11/23 0800 12/11/23 1117 12/12/23 0500  Weight: 74.6 kg 73.6 kg 74.7 kg    Intake/Output: I/O last 3 completed shifts: In: -  Out: 1175 [Urine:175; Other:1000]   Intake/Output this shift:  No intake/output data recorded.  Physical Exam: General: No acute distress  Head: Normocephalic, atraumatic. Dry mucosal membranes  Eyes: Anicteric  Neck: Supple  Lungs:  Normal breathing effort  Heart: Regular rate and rhythm  Abdomen:  Soft, nontender, BS present  Extremities: No peripheral edema.  Neurologic: Nonfocal, moving all four extremities  Skin: No lesions  Dialysis Access: Right IJ PermCath placed on 6/25    Basic Metabolic Panel: Recent Labs  Lab 12/06/23 0750 12/07/23 1219 12/09/23 0509 12/10/23 1400 12/11/23 0811 12/12/23 0050  NA 130* 132* 132* 127* 132* 134*  K 3.9 3.5 3.8 3.6 3.7 3.6  CL 93* 94* 95* 91* 96* 97*  CO2 26 27 26 24 27 27   GLUCOSE 182* 238* 218* 238* 222* 165*  BUN 32* 28* 41* 51* 28* 18   CREATININE 3.03* 2.65* 3.78* 4.46* 2.71* 2.06*  CALCIUM  7.9* 7.7* 8.1* 7.8* 8.0* 8.0*  PHOS 4.0 2.8  --  5.4* 3.7 2.4*    Liver Function Tests: Recent Labs  Lab 12/06/23 0750 12/07/23 1219 12/10/23 1400 12/11/23 0811 12/12/23 0050  ALBUMIN 2.0* 1.8* 1.7* 1.8* 1.8*   No results for input(s): LIPASE, AMYLASE in the last 168 hours. No results for input(s): AMMONIA in the last 168 hours.  CBC: Recent Labs  Lab 12/09/23 0509 12/10/23 0326 12/11/23 0336 12/11/23 1155 12/12/23 0050 12/12/23 0420  WBC 13.8* 13.8* 10.6*  --  12.3* 12.2*  HGB 7.3* 6.3* 7.1* 7.6* 7.7* 7.6*  HCT 22.8* 19.8* 22.2*  --  23.7* 23.9*  MCV 93.1 92.5 92.5  --  91.5 91.9  PLT 209 212 218  --  239 264    Cardiac Enzymes: Recent Labs  Lab 12/12/23 0050  CKTOTAL 21*    BNP: Invalid input(s): POCBNP  CBG: Recent Labs  Lab 12/11/23 1217 12/11/23 1628 12/11/23 2012 12/12/23 0753 12/12/23 1143  GLUCAP 152* 178* 161* 129* 141*    Microbiology: Results for orders placed or performed during the hospital encounter of 11/28/23  Culture, blood (Routine X 2) w Reflex to ID Panel     Status: Abnormal (Preliminary result)   Collection Time: 12/04/23  6:34 PM   Specimen: BLOOD  Result Value Ref Range Status   Specimen Description   Final    BLOOD BLOOD RIGHT ARM Performed at [facility], [address] [place], [place] [address]    Special Requests   Final    BOTTLES DRAWN AEROBIC AND ANAEROBIC Blood Culture adequate volume Performed at [facility], [address] [place], [place] [address]    Culture  Setup Time   Final    Organism ID to follow IN BOTH AEROBIC AND ANAEROBIC BOTTLES GRAM POSITIVE COCCI CRITICAL RESULT CALLED TO, READ BACK BY AND VERIFIED WITH: SELINDA SIMPERS 12/06/2023 AT 9351 SRR Performed at [facility], [address] [place], [place] [address]    Culture (A)  Final    METHICILLIN RESISTANT STAPHYLOCOCCUS AUREUS Sent to [facility]  for further susceptibility testing. Performed at [facility], [address], [place], [place] [address]    Report Status PENDING  Incomplete   Organism ID, Bacteria METHICILLIN RESISTANT STAPHYLOCOCCUS AUREUS  Final      Susceptibility   Methicillin resistant staphylococcus aureus - MIC*    CIPROFLOXACIN  >=8 RESISTANT Resistant     ERYTHROMYCIN  >=8 RESISTANT Resistant     GENTAMICIN  <=0.5 SENSITIVE Sensitive     OXACILLIN >=4 RESISTANT Resistant     TETRACYCLINE <=1 SENSITIVE Sensitive     VANCOMYCIN  2 SENSITIVE Sensitive     TRIMETH/SULFA <=10 SENSITIVE Sensitive     CLINDAMYCIN <=0.25 SENSITIVE Sensitive     RIFAMPIN <=0.5 SENSITIVE Sensitive     Inducible Clindamycin NEGATIVE Sensitive     LINEZOLID  2 SENSITIVE Sensitive     * METHICILLIN RESISTANT STAPHYLOCOCCUS AUREUS  Culture, blood (Routine X 2) w Reflex to ID Panel     Status: Abnormal   Collection Time: 12/04/23  6:34 PM   Specimen: BLOOD  Result Value Ref Range Status   Specimen Description   Final    BLOOD BLOOD LEFT ARM Performed at [facility], [address] [place], [place] [address]    Special Requests   Final    BOTTLES DRAWN AEROBIC AND ANAEROBIC Blood Culture adequate volume Performed at [facility], [address] [place], [place] [address]    Culture  Setup Time   Final    GRAM POSITIVE COCCI IN BOTH AEROBIC AND ANAEROBIC BOTTLES CRITICAL VALUE NOTED.  VALUE IS CONSISTENT WITH PREVIOUSLY REPORTED AND CALLED VALUE. Performed at [facility], [address] [place], [place] [address]    Culture (A)  Final    STAPHYLOCOCCUS AUREUS SUSCEPTIBILITIES PERFORMED ON PREVIOUS CULTURE WITHIN THE LAST 5 DAYS. Performed at [facility], [address] [place], [place] [address]    Report Status 12/08/2023 FINAL  Final  Blood Culture ID Panel (Reflexed)     Status: Abnormal   Collection Time: 12/04/23  6:34 PM  Result Value Ref Range Status   Enterococcus faecalis  NOT DETECTED NOT DETECTED Final   Enterococcus Faecium NOT DETECTED NOT DETECTED Final   Listeria monocytogenes NOT DETECTED NOT DETECTED Final   Staphylococcus species DETECTED (A) NOT DETECTED Final    Comment: CRITICAL RESULT CALLED TO, READ BACK BY AND VERIFIED WITH: JASON ROBBINS 12/06/2023 AT 0648 SRR    Staphylococcus aureus (BCID) DETECTED (A) NOT DETECTED Final    Comment: Methicillin (oxacillin)-resistant Staphylococcus aureus (MRSA). MRSA is predictably resistant to beta-lactam antibiotics (except ceftaroline). Preferred therapy is vancomycin  unless clinically contraindicated. Patient requires contact precautions if  hospitalized. CRITICAL RESULT CALLED TO, READ BACK BY AND VERIFIED WITH: JASON ROBBINS 12/06/2023 AT 9351 SRR    Staphylococcus epidermidis NOT DETECTED NOT DETECTED Final   Staphylococcus lugdunensis NOT DETECTED  NOT DETECTED Final   Streptococcus species NOT DETECTED NOT DETECTED Final   Streptococcus agalactiae NOT DETECTED NOT DETECTED Final   Streptococcus pneumoniae NOT DETECTED NOT DETECTED Final   Streptococcus pyogenes NOT DETECTED NOT DETECTED Final   A.calcoaceticus-baumannii NOT DETECTED NOT DETECTED Final   Bacteroides fragilis NOT DETECTED NOT DETECTED Final   Enterobacterales NOT DETECTED NOT DETECTED Final   Enterobacter cloacae complex NOT DETECTED NOT DETECTED Final   Escherichia coli NOT DETECTED NOT DETECTED Final   Klebsiella aerogenes NOT DETECTED NOT DETECTED Final   Klebsiella oxytoca NOT DETECTED NOT DETECTED Final   Klebsiella pneumoniae NOT DETECTED NOT DETECTED Final   Proteus species NOT DETECTED NOT DETECTED Final   Salmonella species NOT DETECTED NOT DETECTED Final   Serratia marcescens NOT DETECTED NOT DETECTED Final   Haemophilus influenzae NOT DETECTED NOT DETECTED Final   Neisseria meningitidis NOT DETECTED NOT DETECTED Final   Pseudomonas aeruginosa NOT DETECTED NOT DETECTED Final   Stenotrophomonas maltophilia NOT DETECTED  NOT DETECTED Final   Candida albicans NOT DETECTED NOT DETECTED Final   Candida auris NOT DETECTED NOT DETECTED Final   Candida glabrata NOT DETECTED NOT DETECTED Final   Candida krusei NOT DETECTED NOT DETECTED Final   Candida parapsilosis NOT DETECTED NOT DETECTED Final   Candida tropicalis NOT DETECTED NOT DETECTED Final   Cryptococcus neoformans/gattii NOT DETECTED NOT DETECTED Final   Meth resistant mecA/C and MREJ DETECTED (A) NOT DETECTED Final    Comment: CRITICAL RESULT CALLED TO, READ BACK BY AND VERIFIED WITH: JASON ROBBINS 12/06/2023 AT 9351 SRR Performed at [facility], [address] [place], [place] [address]   MIC (1 Drug)-blood culture; 12/04/2023; BLOOD RIGHT ARM; MRSA; Daptomycin      Status: Abnormal   Collection Time: 12/04/23  6:34 PM   Specimen: BLOOD RIGHT ARM  Result Value Ref Range Status   Min Inhibitory Conc (1 Drug) Preliminary report (A)  Final    Comment: (NOTE) Performed At: [facility] [employer] [address] [place], [place] [id number] Jennette Shorter MD Ey:[phone]    Source BLOOD CULTURE  Final    Comment: Performed at [facility], [address] [place], [place] [address]  MIC Result     Status: Abnormal   Collection Time: 12/04/23  6:34 PM  Result Value Ref Range Status   Result 1 (MIC) Comment (A)  Final    Comment: (NOTE) Methicillin - resistant Staphylococcus aureus Identification performed by account, not confirmed by this laboratory. DAPTOMYCIN  Performed At: [facility] [address] [place], [place] [id number] Jennette Shorter MD Ey:[phone]   SARS Coronavirus 2 by RT PCR (hospital order, performed in [facility] hospital lab) *cepheid single result test* Anterior Nasal Swab     Status: None   Collection Time: 12/04/23  8:40 PM   Specimen: Anterior Nasal Swab  Result Value Ref Range Status   SARS Coronavirus 2 by RT PCR NEGATIVE NEGATIVE Final    Comment: (NOTE) SARS-CoV-2 target nucleic acids are NOT  DETECTED.  The SARS-CoV-2 RNA is generally detectable in upper and lower respiratory specimens during the acute phase of infection. The lowest concentration of SARS-CoV-2 viral copies this assay can detect is 250 copies / mL. A negative result does not preclude SARS-CoV-2 infection and should not be used as the sole basis for treatment or other patient management decisions.  A negative result may occur with improper specimen collection / handling, submission of specimen other than nasopharyngeal swab, presence of viral mutation(s) within the  areas targeted by this assay, and inadequate number of viral copies (<250 copies / mL). A negative result must be combined with clinical observations, patient history, and epidemiological information.  Fact Sheet for Patients:   RoadLapTop.co.za  Fact Sheet for Healthcare Providers: http://kim-miller.com/  This test is not yet approved or  cleared by the [place]  FDA and has been authorized for detection and/or diagnosis of SARS-CoV-2 by FDA under an Emergency Use Authorization (EUA).  This EUA will remain in effect (meaning this test can be used) for the duration of the COVID-19 declaration under Section 564(b)(1) of the Act, 21 U.S.C. section 360bbb-3(b)(1), unless the authorization is terminated or revoked sooner.  Performed at [facility], [address], [place], [place] [address]   Urine Culture (for pregnant, neutropenic or urologic patients or patients with an indwelling urinary catheter)     Status: Abnormal   Collection Time: 12/04/23  8:45 PM   Specimen: Urine, Clean Catch  Result Value Ref Range Status   Specimen Description   Final    URINE, CLEAN CATCH Performed at [facility], [address], [place], [place] [address]    Special Requests   Final    NONE Performed at [facility], [address], [place], [place] [address]    Culture (A)   Final    <10,000 COLONIES/mL INSIGNIFICANT GROWTH Performed at [facility], [address] [place], [place] [address]    Report Status 12/06/2023 FINAL  Final  Respiratory (~20 pathogens) panel by PCR     Status: None   Collection Time: 12/04/23 11:30 PM   Specimen: Nasopharyngeal Swab; Respiratory  Result Value Ref Range Status   Adenovirus NOT DETECTED NOT DETECTED Final   Coronavirus 229E NOT DETECTED NOT DETECTED Final    Comment: (NOTE) The Coronavirus on the Respiratory Panel, DOES NOT test for the novel  Coronavirus (2019 nCoV)    Coronavirus HKU1 NOT DETECTED NOT DETECTED Final   Coronavirus NL63 NOT DETECTED NOT DETECTED Final   Coronavirus OC43 NOT DETECTED NOT DETECTED Final   Metapneumovirus NOT DETECTED NOT DETECTED Final   Rhinovirus / Enterovirus NOT DETECTED NOT DETECTED Final   Influenza A NOT DETECTED NOT DETECTED Final   Influenza B NOT DETECTED NOT DETECTED Final   Parainfluenza Virus 1 NOT DETECTED NOT DETECTED Final   Parainfluenza Virus 2 NOT DETECTED NOT DETECTED Final   Parainfluenza Virus 3 NOT DETECTED NOT DETECTED Final   Parainfluenza Virus 4 NOT DETECTED NOT DETECTED Final   Respiratory Syncytial Virus NOT DETECTED NOT DETECTED Final   Bordetella pertussis NOT DETECTED NOT DETECTED Final   Bordetella Parapertussis NOT DETECTED NOT DETECTED Final   Chlamydophila pneumoniae NOT DETECTED NOT DETECTED Final   Mycoplasma pneumoniae NOT DETECTED NOT DETECTED Final    Comment: Performed at [facility], [address] [place], [place] [address]  Cath Tip Culture     Status: None   Collection Time: 12/06/23  2:45 PM   Specimen: Catheter Tip; Other  Result Value Ref Range Status   Specimen Description   Final    CATH TIP Performed at [facility], [address] [place], [place] [address]    Special Requests   Final    NONE Performed at [facility], [address], [place], [place] [address]    Culture   Final     NO GROWTH 2 DAYS Performed at [facility], [address], [place], [place] [address]  Report Status 12/10/2023 FINAL  Final  MRSA Next Gen by PCR, Nasal     Status: Abnormal   Collection Time: 12/07/23  9:00 AM   Specimen: Nasal Mucosa; Nasal Swab  Result Value Ref Range Status   MRSA by PCR Next Gen DETECTED (A) NOT DETECTED Final    Comment: RESULT CALLED TO, READ BACK BY AND VERIFIED WITH: ELIZABETH HAYS 12/07/23 1041 SLM (NOTE) The GeneXpert MRSA Assay (FDA approved for NASAL specimens only), is one component of a comprehensive MRSA colonization surveillance program. It is not intended to diagnose MRSA infection nor to guide or monitor treatment for MRSA infections. Test performance is not FDA approved in patients less than 68 years old. Performed at [facility], [address] [place], [place] [address]   Culture, blood (Routine X 2) w Reflex to ID Panel     Status: None   Collection Time: 12/07/23 12:50 PM   Specimen: BLOOD  Result Value Ref Range Status   Specimen Description BLOOD RIGHT ANTECUBITAL  Final   Special Requests   Final    BOTTLES DRAWN AEROBIC AND ANAEROBIC Blood Culture adequate volume   Culture   Final    NO GROWTH 5 DAYS Performed at [facility], [address], [place], [place] [address]    Report Status 12/12/2023 FINAL  Final  Culture, blood (Routine X 2) w Reflex to ID Panel     Status: None   Collection Time: 12/07/23 12:50 PM   Specimen: BLOOD  Result Value Ref Range Status   Specimen Description BLOOD LEFT ANTECUBITAL  Final   Special Requests   Final    BOTTLES DRAWN AEROBIC AND ANAEROBIC Blood Culture adequate volume   Culture   Final    NO GROWTH 5 DAYS Performed at [facility], [address] [place], [place] [address]    Report Status 12/12/2023 FINAL  Final    Coagulation Studies: No results for input(s): LABPROT, INR in the last 72 hours.  Urinalysis: No results for input(s):  COLORURINE, LABSPEC, PHURINE, GLUCOSEU, HGBUR, BILIRUBINUR, KETONESUR, PROTEINUR, UROBILINOGEN, NITRITE, LEUKOCYTESUR in the last 72 hours.  Invalid input(s): APPERANCEUR     Imaging: No results found.     Medications:    anticoagulant sodium citrate       sodium chloride    Intravenous Once   amiodarone   400 mg Oral BID   Followed by   NOREEN ON 12/21/2023] amiodarone   200 mg Oral Daily   apixaban   2.5 mg Oral BID   aspirin  EC  81 mg Oral QHS   Chlorhexidine  Gluconate Cloth  6 each Topical Q0600   cloNIDine   0.2 mg Oral BID   diltiazem   120 mg Oral Daily   epoetin  alfa-epbx (RETACRIT ) injection  4,000 Units Intravenous Q T,Th,Sat-1800   insulin  aspart  0-5 Units Subcutaneous QHS   insulin  aspart  0-9 Units Subcutaneous TID WC   insulin  aspart  3 Units Subcutaneous TID WC   insulin  glargine-yfgn  10 Units Subcutaneous QHS   ipratropium-albuterol   3 mL Nebulization TID   lactose free nutrition  237 mL Oral TID WC   lactulose   30 g Oral Once   lidocaine   10 mL Intradermal Once   losartan   100 mg Oral QHS   metoprolol  succinate  100 mg Oral QHS   multivitamin  1 tablet Oral QHS   polyethylene glycol  17 g Oral Daily   torsemide   40 mg Oral Daily   vancomycin  variable dose per unstable renal  function (pharmacist dosing)   Does not apply See admin instructions   acetaminophen  **OR** acetaminophen , albuterol , anticoagulant sodium citrate , guaiFENesin -dextromethorphan , heparin , hydrALAZINE , ondansetron  **OR** ondansetron  (ZOFRAN ) IV, oxyCODONE -acetaminophen , sucralfate , zolpidem   Assessment/ Plan:  [name] is a 78 y.o.  female  with past medical history of hypertension, long-standing diabetes mellitus type 2, lower extremity edema, hyperlipidemia, tobacco abuse, prior history of CVA, COPD, obstructive sleep apnea, lumbar spinal fusion, left upper lobe lung mass who presents with volume overload and increasing shortness of breath.   1.  Acute  kidney injury/chronic kidney disease stage IV/diabetes mellitus type 2 with chronic kidney disease/proteinuria/MRSA bacteremia.  eGFR down to 11.  Baseline creatinine 2.7 in February 2025.  Creatinine increased to 3.6-4 in June 2025. - PermCath originally placed  (12/01/2023) and removed 12/06/2023.  Negative TEE 12/08/2023. - Case discussed with infectious diseases on 12/09/2023.  Will need IV Vancomycin  1g/1g/1g until 7/20.  - Appreciate vascular surgery placing permcath on 6/25 -Outpatient dialysis seat at [facility] [address] has been secured, TTS. Patient to arrive at 11:30 AM. Confirming chair remains available. -Next treatment scheduled for Saturday  Lab Results  Component Value Date   CREATININE 2.06 (H) 12/12/2023   CREATININE 2.71 (H) 12/11/2023   CREATININE 4.46 (H) 12/10/2023     Intake/Output Summary (Last 24 hours) at 12/12/2023 1249 Last data filed at 12/12/2023 0500 Gross per 24 hour  Intake --  Output 100 ml  Net -100 ml       2.  Hyponatremia.  Serum sodium improving with dialysis  3.  Hyperkalemia Resolved   4.  HTN with chronic kidney disease - 2D echo from 11/29/2023 shows LVEF 55 to 60%, grade 2 diastolic dysfunction, trivial mitral regurgitation Continue clonidine , losartan , and metoprolol . Blood pressure acceptable   5.  Anemia of chronic kidney disease.  Patient has  left upper lobe mass concerning for malignancy.  Discussed with Dr. Jacobo. Hgb 7.6, will continue ESA with dialysis.    LOS: 14 Haeli Gerlich 6/27/202512:49 PM

## 2023-12-12 NOTE — Progress Notes (Signed)
 Contacted by staff regarding inquiry if [facility] [address] has any MWF availability due to snf placement. Contacted [facility] and advised by staff that clinic currently does not have any MWF chairs available. Navigator confirmed pt was provided a TTS 12:10 pm chair time and was able to start at clinic earlier this week if pt had been stable for d/c. Pt will need to arrive at 11:45 for first appt to complete paperwork prior to treatment and clinic unable to start pt tomorrow. Pt can start on Tuesday. This info was provided to CSW and renal NP.   Randine Mungo Renal Navigator [phone]

## 2023-12-12 NOTE — Progress Notes (Signed)
 PHARMACY CONSULT NOTE FOR:  OUTPATIENT  PARENTERAL ANTIBIOTIC THERAPY (OPAT)  Indication: MRSA bacteremia Regimen: Vancomycin  1gm IV with hemodialysis on Tue/Thur/Sat at dialysis center End date: 01/04/2024 (last dose with HD will be on 7/19)  - Labs - weekly:  CBC/D, CMP, CRP and pre-HD vancomycin  level - FAX weekly labs to [phone]   IV antibiotic discharge orders are pended. To discharging provider:  please sign these orders via discharge navigator,  Select New Orders & click on the button choice - Manage This Unsigned Work.     Thank you for allowing pharmacy to be a part of this patient's care.  Skarlett Sedlacek, PharmD, BCPS, BCIDP Work Cell: [phone] 12/12/2023 2:04 PM

## 2023-12-12 NOTE — Progress Notes (Signed)
 Physical Therapy Treatment Patient Details Name: Mckenzie Lawrence MRN: [id number] DOB: 07/18/1945 Today's Date: 12/12/2023   History of Present Illness 78 y/o female presented to ED on 11/28/23 for worsening SOB and chest pressure. Admitted for ESRD and acute CHF exacerbation. PMH: CAD, HTN, COPD, OSA, hx of CVA, CKD stage 4, SIADH, T2DM    PT Comments  Today's tx involved continuation of ambulation with addition of therex to improve overall LE strength. Pt continues to require increased time and verbal cues to initiate and execute STS transfer, no LOB was experienced. Ambulation this date x60' with RW, verbal cueing on manipulation of RW but no seated rest break required throughout, no LOB. Seated LE therex performed in attempts to increase LE strength, pt educated on performing exercises numerous times a day. Pt was able to participate more in therapy this date and is progressing toward meeting overall goals. PT to follow acutely as appropriate.    If plan is discharge home, recommend the following: A little help with bathing/dressing/bathroom;Assistance with cooking/housework;Assist for transportation;Help with stairs or ramp for entrance;A lot of help with walking and/or transfers   Can travel by private vehicle     Yes  Equipment Recommendations  None recommended by PT    Recommendations for Other Services       Precautions / Restrictions Precautions Precautions: Fall Recall of Precautions/Restrictions: Intact Restrictions Weight Bearing Restrictions Per Provider Order: No     Mobility  Bed Mobility               General bed mobility comments: pt found sitting in chair upon start of session    Transfers Overall transfer level: Needs assistance Equipment used: Rolling walker (2 wheels) Transfers: Sit to/from Stand Sit to Stand: Contact guard assist           General transfer comment: increased time to complete transition, no LOB experienced, minimal verbal cueing  on use of RW    Ambulation/Gait Ambulation/Gait assistance: Contact guard assist Gait Distance (Feet): 60 Feet Assistive device: Rolling walker (2 wheels) Gait Pattern/deviations: Step-through pattern, Decreased step length - right, Decreased step length - left, Decreased stride length, Trunk flexed Gait velocity: decreased     General Gait Details: Patient requires verbal cues to stay close to AD. Patient fatigued with this distance.   Stairs             Wheelchair Mobility     Tilt Bed    Modified Rankin (Stroke Patients Only)       Balance Overall balance assessment: Needs assistance Sitting-balance support: Feet supported Sitting balance-Leahy Scale: Good Sitting balance - Comments: sitting in chair   Standing balance support: Bilateral upper extremity supported, Reliant on assistive device for balance Standing balance-Leahy Scale: Fair Standing balance comment: CGA with RW                            Communication Communication Communication: No apparent difficulties  Cognition Arousal: Alert, Lethargic Behavior During Therapy: WFL for tasks assessed/performed   PT - Cognitive impairments: No apparent impairments                         Following commands: Intact      Cueing Cueing Techniques: Verbal cues, Tactile cues  Exercises General Exercises - Lower Extremity Ankle Circles/Pumps: AROM, Both, 10 reps, Seated Long Arc Quad: AROM, Both, 10 reps, Seated Hip Flexion/Marching: AROM, Both, 10 reps, Seated  General Comments        Pertinent Vitals/Pain Pain Assessment Pain Assessment: No/denies pain    Home Living                          Prior Function            PT Goals (current goals can now be found in the care plan section) Acute Rehab PT Goals Patient Stated Goal: to feel better PT Goal Formulation: With patient Time For Goal Achievement: 12/16/23 Potential to Achieve Goals: Good Progress  towards PT goals: Progressing toward goals    Frequency    Min 2X/week      PT Plan      Co-evaluation     PT goals addressed during session: Mobility/safety with mobility;Balance;Proper use of DME        AM-PAC PT 6 Clicks Mobility   Outcome Measure  Help needed turning from your back to your side while in a flat bed without using bedrails?: A Little Help needed moving from lying on your back to sitting on the side of a flat bed without using bedrails?: A Little Help needed moving to and from a bed to a chair (including a wheelchair)?: A Little Help needed standing up from a chair using your arms (e.g., wheelchair or bedside chair)?: A Lot Help needed to walk in hospital room?: A Lot Help needed climbing 3-5 steps with a railing? : A Lot 6 Click Score: 15    End of Session Equipment Utilized During Treatment: Gait belt Activity Tolerance: Patient limited by fatigue Patient left: in chair;with call bell/phone within reach;with family/visitor present Nurse Communication: Mobility status PT Visit Diagnosis: Other abnormalities of gait and mobility (R26.89);Muscle weakness (generalized) (M62.81);Difficulty in walking, not elsewhere classified (R26.2)     Time: 8886-8869 PT Time Calculation (min) (ACUTE ONLY): 17 min  Charges:                           Neveen Daponte Romero-Perozo, SPT  12/12/2023, 11:40 AM

## 2023-12-12 NOTE — Plan of Care (Signed)
  Problem: Education: Goal: Ability to describe self-care measures that may prevent or decrease complications (Diabetes Survival Skills Education) will improve Outcome: Progressing Goal: Individualized Educational Video(s) Outcome: Progressing   Problem: Coping: Goal: Ability to adjust to condition or change in health will improve Outcome: Progressing   Problem: Fluid Volume: Goal: Ability to maintain a balanced intake and output will improve Outcome: Progressing   Problem: Health Behavior/Discharge Planning: Goal: Ability to identify and utilize available resources and services will improve Outcome: Progressing Goal: Ability to manage health-related needs will improve Outcome: Progressing   Problem: Metabolic: Goal: Ability to maintain appropriate glucose levels will improve Outcome: Progressing   Problem: Nutritional: Goal: Maintenance of adequate nutrition will improve Outcome: Progressing Goal: Progress toward achieving an optimal weight will improve Outcome: Progressing   Problem: Skin Integrity: Goal: Risk for impaired skin integrity will decrease Outcome: Progressing   Problem: Health Behavior/Discharge Planning: Goal: Ability to manage health-related needs will improve Outcome: Progressing   Problem: Education: Goal: Knowledge of General Education information will improve Description: Including pain rating scale, medication(s)/side effects and non-pharmacologic comfort measures Outcome: Progressing   Problem: Clinical Measurements: Goal: Ability to maintain clinical measurements within normal limits will improve Outcome: Progressing Goal: Will remain free from infection Outcome: Progressing Goal: Diagnostic test results will improve Outcome: Progressing Goal: Respiratory complications will improve Outcome: Progressing Goal: Cardiovascular complication will be avoided Outcome: Progressing   Problem: Activity: Goal: Risk for activity intolerance will  decrease Outcome: Progressing   Problem: Nutrition: Goal: Adequate nutrition will be maintained Outcome: Progressing   Problem: Coping: Goal: Level of anxiety will decrease Outcome: Progressing   Problem: Pain Managment: Goal: General experience of comfort will improve and/or be controlled Outcome: Progressing   Problem: Elimination: Goal: Will not experience complications related to bowel motility Outcome: Progressing Goal: Will not experience complications related to urinary retention Outcome: Progressing   Problem: Safety: Goal: Ability to remain free from injury will improve Outcome: Progressing   Problem: Skin Integrity: Goal: Risk for impaired skin integrity will decrease Outcome: Progressing   Problem: Activity: Goal: Capacity to carry out activities will improve Outcome: Progressing   Problem: Cardiac: Goal: Ability to achieve and maintain adequate cardiopulmonary perfusion will improve Outcome: Progressing   Problem: Education: Goal: Knowledge of disease or condition will improve Outcome: Progressing Goal: Knowledge of the prescribed therapeutic regimen will improve Outcome: Progressing Goal: Individualized Educational Video(s) Outcome: Progressing   Problem: Respiratory: Goal: Ability to maintain a clear airway will improve Outcome: Progressing Goal: Levels of oxygenation will improve Outcome: Progressing Goal: Ability to maintain adequate ventilation will improve Outcome: Progressing   Problem: Education: Goal: Knowledge of disease and its progression will improve Outcome: Progressing Goal: Individualized Educational Video(s) Outcome: Progressing   Problem: Nutritional: Goal: Ability to make healthy dietary choices will improve Outcome: Progressing   Problem: Health Behavior/Discharge Planning: Goal: Ability to manage health-related needs will improve Outcome: Progressing   Problem: Clinical Measurements: Goal: Complications related to  the disease process, condition or treatment will be avoided or minimized Outcome: Progressing

## 2023-12-12 NOTE — Progress Notes (Signed)
 Progress Note   Patient: Mckenzie Lawrence FMW:[id number] DOB: 1945/12/05 DOA: 11/28/2023     14 DOS: the patient was seen and examined on 12/12/2023   Brief hospital course: Mckenzie Lawrence is a 78 y.o. female with medical history significant for COPD, CVA, stage 4 CKD, SIADH, esophageal dysphagia treated with Botox  (last in 05/2023), DM, HLD, and HTN, last hospitalized from 2/1 to 07/26/2023 with MRSA bacteremia/sepsis/multifocal pneumonia, now presenting by EMS with shortness of breath and chest pressure.   She has no orthopnea and sleeps on 1 pillow.  She has a chronic congested cough that has not changed and has no fever or chills  BP on arrival 202/74 and tachypneic to 25 saturating at 98% on room air. WBC 11,000 with hemoglobin of 9 which is her baseline. Troponin 19 and BNP 2450 Creatinine 3.59 up from baseline of 2.74 with bicarb 19.  Glucose 207, potassium 5.2, sodium 123 (137 four months ago) EKG with sinus rhythm at 79 Chest x-ray showing small right pleural effusion and vascular congestion Patient treated with DuoNeb and Solu-Medrol  and given a dose of Lasix  Patient was diagnosed with acute on chronic diastolic congestive heart failure, she also had progressed to end-stage renal disease.  She was treated with initially with IV Lasix , then hemodialysis was started on 6/17. Blood culture positive again for MRSA, started on vancomycin .     Principal Problem:   Hypertensive urgency/emergency Active Problems:   COPD with acute exacerbation (HCC)   Acute dyspnea   CAD (coronary artery disease)   Chest pain   Acute renal failure superimposed on stage 4 chronic kidney disease (HCC)   Hyponatremia   MRSA (methicillin resistant Staphylococcus aureus) septicemia (HCC)   Hypertensive emergency   Encounter for dialysis catheter care (HCC)   Paroxysmal atrial fibrillation (HCC)   ESRD (end stage renal disease) (HCC)   Acute on chronic diastolic CHF (congestive heart failure)  (HCC)   Assessment and Plan: MRSA septicemia:  Patient had a fever of 102.4 on 6/19, at the same time, heart rate of 92, leukocytosis of 12.1. Blood culture was positive for MRSA. Hx mrsa bacteremia 2/24 2/2 infected spinal hardware, had recurrent bout of mrsa bacteremia earlier this year no source identified. Has dialysis catheter placed this hospitalization, removed by vascular on 6/21. PIV also exchanged Repeat blood cultures ordered 6/22, ngtd. TTE/TEE negative. ID following.  Planning for vancomycin  for the least 4 weeks, too early to perform another scan of the spine. Condition stable, continue vancomycin  which will be given after each dialysis. Patient condition stable, no change in treatment plan.   Paroxysmal a-fib:  new overnight 6/22, now back in sinus.  Patient started on Eliquis , started having nasal bleeding.  Decrease dose to 2.5 mg twice a day due to end-stage renal disease 6/26. Nasal bleeding has resolved.   Hypertensive emergency:  Blood pressure well-controlled.   Acute on chronic diastolic CHF:  w/ elevated BNP, CXR showing vascular congestion, pitting LE edema & dyspnea. Echo shows EF 50-55%, grade II diastolic dysfunction, no regional wall motion abnormalities. Patient on regular dialysis, volume status much better.   COPD exacerbation:  Treated with steroids, condition has stabilized.  However, patient still having significant cough with large amount of mucus.  DuoNeb scheduled. Patient currently on oxygen without documented hypoxemia, will wean off oxygen   Chest pain: w/ hx of CAD. Troponins are minimally elevated.  EKG shows no ischemic changes. Continue on losartan , metoprolol , statin, aspirin . No CP currently  ESRD progressing from [facility] Hyperkalemia secondary to end-stage renal disease. Hyponatremia. Anemia of chronic kidney disease. : tunneled HD cath placed. Started on scheduled HD 12/02/23.  Permacath placed on 6/25. Patient has significant  anemia, hemoglobin dropped down to 6.3 today, received 1 unit PRBC, repeat hemoglobin today 7.6.  Recent iron study in 07/2023 showed a normal iron and B12 level.  No evidence of active bleeding.   Anxiety: severity unknown. Valium  discontinued 6/19    Esophageal dysphagia: s/p botox  via EGD in 08/2022. Aspiration precautions. Continue w/ supportive care   OSA: CPAP qhs   DM2: well controlled at baseline, HbA1c 6.1. sugars labile here, on semglee /ssi   Debility: PT rec is now snf, toc consulted.     Constipation. Last documented bowel movement was 6/23, start senna and lactulose      Subjective:  Patient doing well today, no segment short of breath. Constipated, last bowel movement 6/23.  Physical Exam: Vitals:   12/12/23 0406 12/12/23 0500 12/12/23 0752 12/12/23 1141  BP: (!) 135/53  (!) 144/51 (!) 134/45  Pulse: 74  73 66  Resp: 17     Temp: 98.6 F (37 C)  98.3 F (36.8 C) 98.3 F (36.8 C)  TempSrc:   Oral   SpO2: 96%  97% 100%  Weight:  74.7 kg    Height:       General exam: Appears calm and comfortable  Respiratory system: Decreased breath sounds. Respiratory effort normal. Cardiovascular system: S1 & S2 heard, RRR. No JVD, murmurs, rubs, gallops or clicks.  Gastrointestinal system: Abdomen is nondistended, soft and nontender. No organomegaly or masses felt. Normal bowel sounds heard. Central nervous system: Alert and oriented. No focal neurological deficits. Extremities: 2+ leg edema Skin: No rashes, lesions or ulcers Psychiatry: Judgement and insight appear normal. Mood & affect appropriate.    Data Reviewed:  Lab results reviewed.  Family Communication: Daughter updated at bedside.  Disposition: Status is: Inpatient Remains inpatient appropriate because: Unsafe discharge, plan discharge tomorrow to SNF.  Discharge summary need to be placed before 1 PM.     Time spent: 35 minutes  Author: Murvin Mana, MD 12/12/2023 1:44 PM  For on call review  [email or web address].

## 2023-12-12 NOTE — Plan of Care (Signed)
  Problem: Fluid Volume: Goal: Compliance with measures to maintain balanced fluid volume will improve Outcome: Progressing   Problem: Clinical Measurements: Goal: Complications related to the disease process, condition or treatment will be avoided or minimized Outcome: Progressing   Problem: Respiratory: Goal: Ability to maintain a clear airway will improve Outcome: Progressing Goal: Levels of oxygenation will improve Outcome: Progressing Goal: Ability to maintain adequate ventilation will improve Outcome: Progressing   Problem: Activity: Goal: Ability to tolerate increased activity will improve Outcome: Progressing Goal: Will verbalize the importance of balancing activity with adequate rest periods Outcome: Progressing   Problem: Cardiac: Goal: Ability to achieve and maintain adequate cardiopulmonary perfusion will improve Outcome: Progressing   Problem: Skin Integrity: Goal: Risk for impaired skin integrity will decrease Outcome: Progressing   Problem: Pain Managment: Goal: General experience of comfort will improve and/or be controlled Outcome: Progressing   Problem: Elimination: Goal: Will not experience complications related to bowel motility Outcome: Progressing Goal: Will not experience complications related to urinary retention Outcome: Progressing   Problem: Clinical Measurements: Goal: Ability to maintain clinical measurements within normal limits will improve Outcome: Progressing Goal: Will remain free from infection Outcome: Progressing Goal: Diagnostic test results will improve Outcome: Progressing Goal: Respiratory complications will improve Outcome: Progressing Goal: Cardiovascular complication will be avoided Outcome: Progressing   Problem: Tissue Perfusion: Goal: Adequacy of tissue perfusion will improve Outcome: Progressing

## 2023-12-12 NOTE — Progress Notes (Signed)
 [facility] PROGRESS NOTE       Patient ID: [facility] MRN: [id number] DOB/AGE: Oct 19, 1945 78 y.o.  Admit date: 11/28/2023 Referring Physician Dr. Delayne Solian Primary Physician [name], MD Primary Cardiologist Dr. Ammon Reason for Consultation diastolic heart failure with SOB.  HPI: [facility] is a 78 y.o. female  with a past medical history of coronary artery disease, hypertension, hyperlipidemia, COPD, OSA, hx CVA, CKD stage 4, SIADH, diabetes mellitus who presented to the ED on 11/28/2023 for worsening and persistent SOB and lower extremity swelling with concurrent COPD exacerbation and found elevated BNP of 2500. Cardiology was consulted for further evaluation.   Interval History:   -Patient seen and examined this afternoon.  Patient sitting comfortably in bedside chair.  Patient states she feels pretty good today and reports improvement to SOB with breathing treatments. -Patients BP and HR stable this AM.  Per telemetry patient remains in sinus rhythm with rates 60s. -Patient reports having no more significant nosebleeds. -Patient remains on 2L with stable SpO2.  - Patient plans to be discharged to rehab tomorrow.   Review of systems complete and found to be negative unless listed above    Past Medical History:  Diagnosis Date   Anemia    Arthritis    Bladder incontinence    Broken foot    Cataracts, bilateral    Chronic kidney insufficiency    stage 3b   COPD (chronic obstructive pulmonary disease) (HCC)    wheezing   Coronary artery disease 04/25/2022   in [place]   COVID 2021   very mild case   CVA (cerebral vascular accident) (HCC) 2016   has had 3 strokes, states right side is slightlyweaker than left   Diabetes mellitus    insulin  dependent, Type 2   GERD (gastroesophageal reflux disease)    HLD (hyperlipidemia)    Hx of cardiovascular stress test    a. ETT (6/13):  Ex 5:13; no ischemic changes   Hypertension    controlled on  meds   Lacunar stroke of left subthalamic region [facility]) 02/2015   Leg pain    left   Lower back pain    Neuromuscular disorder (HCC)    stroke right hand tingling   Orthostatic hypotension    Osteopenia 01/2017   T score -2.0 stable from prior DEXA   Pancreatitis 10/2021   PCOS (polycystic ovarian syndrome)    Personal history of tobacco use, presenting hazards to health 01/09/2015   PONV (postoperative nausea and vomiting)    Sleep apnea    uses CPAP nightly    Past Surgical History:  Procedure Laterality Date   BIOPSY  08/28/2022   Procedure: BIOPSY;  Surgeon: Elicia Claw, MD;  Location: [facility];  Service: Gastroenterology;;   BOTOX  INJECTION  01/15/2022   Procedure: BOTOX  INJECTION;  Surgeon: Rosalie Kitchens, MD;  Location: [facility];  Service: Gastroenterology;;   BOTOX  INJECTION N/A 08/28/2022   Procedure: BOTOX  INJECTION;  Surgeon: Elicia Claw, MD;  Location: [facility];  Service: Gastroenterology;  Laterality: N/A;   BOTOX  INJECTION Bilateral 05/27/2023   Procedure: BOTOX  INJECTION;  Surgeon: Rosalie Kitchens, MD;  Location: [facility];  Service: Gastroenterology;  Laterality: Bilateral;   BROW LIFT Bilateral 11/04/2017   Procedure: BLEPHAROPLASTY UPPER EYELID W/EXCESS SKIN;  Surgeon: Ashley Greig CHRISTELLA, MD;  [facility];  Service: Ophthalmology;  Laterality: Bilateral;  DIABETES-insulin  dependent uses CPAP   CARDIAC CATHETERIZATION  20 yrs ago   found nothing  CARPAL TUNNEL RELEASE Bilateral    CATARACT EXTRACTION Bilateral    DIALYSIS/PERMA CATHETER INSERTION N/A 12/01/2023   Procedure: DIALYSIS/PERMA CATHETER INSERTION;  Surgeon: Marea Selinda RAMAN, MD;  Location: [facility];  Service: Cardiovascular;  Laterality: N/A;   DIALYSIS/PERMA CATHETER INSERTION N/A 12/10/2023   Procedure: DIALYSIS/PERMA CATHETER INSERTION;  Surgeon: Jama Cordella MATSU, MD;  Location: [facility];  Service: Cardiovascular;  Laterality: N/A;   ELBOW SURGERY  Bilateral    ESOPHAGOGASTRODUODENOSCOPY N/A 07/25/2021   Procedure: ESOPHAGOGASTRODUODENOSCOPY (EGD);  Surgeon: Toledo, Ladell POUR, MD;  Location: [facility];  Service: Gastroenterology;  Laterality: N/A;  IDDM   ESOPHAGOGASTRODUODENOSCOPY N/A 01/15/2022   Procedure: ESOPHAGOGASTRODUODENOSCOPY (EGD);  Surgeon: Rosalie Kitchens, MD;  Location: [facility];  Service: Gastroenterology;  Laterality: N/A;  botox    ESOPHAGOGASTRODUODENOSCOPY (EGD) WITH PROPOFOL  N/A 08/28/2022   Procedure: ESOPHAGOGASTRODUODENOSCOPY (EGD) WITH PROPOFOL ;  Surgeon: Elicia Claw, MD;  Location: [facility];  Service: Gastroenterology;  Laterality: N/A;   ESOPHAGOGASTRODUODENOSCOPY (EGD) WITH PROPOFOL  Bilateral 05/27/2023   Procedure: ESOPHAGOGASTRODUODENOSCOPY (EGD) WITH PROPOFOL ;  Surgeon: Rosalie Kitchens, MD;  Location: [facility];  Service: Gastroenterology;  Laterality: Bilateral;   FOOT SURGERY     Groin Abscess     HAND SURGERY     KNEE SURGERY Bilateral    LABIAL ABSCESS     LUMBAR LAMINECTOMY/DECOMPRESSION MICRODISCECTOMY Left 05/11/2018   Procedure: LUMBAR LAMINECTOMY/DECOMPRESSION MICRODISCECTOMY 1 LEVEL- L4-5;  Surgeon: Bluford Standing, MD;  Location: [facility];  Service: Neurosurgery;  Laterality: Left;   LUMBAR LAMINECTOMY/DECOMPRESSION MICRODISCECTOMY Left 05/20/2022   Procedure: MICRODISCECTOMY L3-4;  Surgeon: Mavis Purchase, MD;  Location: [facility] OR;  Service: Neurosurgery;  Laterality: Left;  3C   LUMBAR WOUND DEBRIDEMENT N/A 08/08/2022   Procedure: INCISION AND DRAINAGE OF LUMBAR WOUND;  Surgeon: Mavis Purchase, MD;  Location: [facility];  Service: Neurosurgery;  Laterality: N/A;  3C   OOPHORECTOMY     BSO   PUBO VAG SLING     SHOULDER SURGERY     bilateral arthroscopies   TEE WITHOUT CARDIOVERSION N/A 07/25/2023   Procedure: TRANSESOPHAGEAL ECHOCARDIOGRAM (TEE);  Surgeon: Hilarie Rocher, MD;  Location: [facility];  Service: Cardiovascular;  Laterality: N/A;   TEE WITHOUT CARDIOVERSION N/A 12/08/2023   Procedure:  ECHOCARDIOGRAM, TRANSESOPHAGEAL;  Surgeon: Wilburn Keller BROCKS, MD;  Location: [facility];  Service: Cardiovascular;  Laterality: N/A;  TEE   VAGINAL HYSTERECTOMY  1979    Medications Prior to Admission  Medication Sig Dispense Refill Last Dose/Taking   acetaminophen  (TYLENOL ) 500 MG tablet Take 1,000 mg by mouth every 6 (six) hours as needed for moderate pain.   Unknown   albuterol  (VENTOLIN  HFA) 108 (90 Base) MCG/ACT inhaler Inhale 2 puffs into the lungs every 6 (six) hours as needed for wheezing or shortness of breath.   Unknown   aspirin  EC 81 MG tablet Take 81 mg by mouth at bedtime. Swallow whole.   Taking   calcitRIOL  (ROCALTROL ) 0.25 MCG capsule Take 0.25 mcg by mouth at bedtime.   Taking   cholecalciferol (VITAMIN D3) 25 MCG (1000 UNIT) tablet Take 1,000 Units by mouth daily.   Taking   cyclobenzaprine  (FLEXERIL ) 10 MG tablet Take 1 tablet (10 mg total) by mouth 3 (three) times daily as needed for muscle spasms. 30 tablet 0 Taking As Needed   D-MANNOSE PO Take 2 tablets by mouth at bedtime.   Taking   diazepam  (VALIUM ) 5 MG tablet Take 5 mg by mouth 2 (two) times daily as needed.   Taking As Needed  diphenhydrAMINE  (BENADRYL ) 25 MG tablet Take 50 mg by mouth at bedtime as needed for sleep.   Taking As Needed   docusate sodium  (COLACE) 100 MG capsule Take 1 capsule (100 mg total) by mouth 2 (two) times daily. (Patient taking differently: Take 100 mg by mouth at bedtime as needed for mild constipation or moderate constipation.) 30 capsule 0 Taking Differently   estradiol  (ESTRACE ) 0.1 MG/GM vaginal cream Place 1 Applicatorful vaginally 3 (three) times a week.   Taking   Fe Fum-Vit C-Vit B12-FA (TRIGELS-F FORTE) CAPS capsule Take 1 capsule by mouth daily after breakfast. 90 capsule 0 Taking   insulin  glargine-yfgn (SEMGLEE ) 100 UNIT/ML Pen Inject 10 Units into the skin 2 (two) times daily.   Taking   losartan  (COZAAR ) 100 MG tablet Take 1 tablet (100 mg total) by mouth daily. (Patient taking  differently: Take 100 mg by mouth at bedtime.) 30 tablet 2 Taking Differently   metoprolol  succinate (TOPROL -XL) 100 MG 24 hr tablet Take 100 mg by mouth at bedtime.   Taking   Multiple Vitamins-Minerals (PRESERVISION AREDS 2+MULTI VIT PO) Take 1 capsule by mouth daily.   Taking   nitrofurantoin  (MACRODANTIN ) 100 MG capsule Take 100 mg by mouth at bedtime.   Taking   rosuvastatin  (CRESTOR ) 5 MG tablet Take 5 mg by mouth daily.   Taking   sennosides-docusate sodium  (SENOKOT-S) 8.6-50 MG tablet Take 1 tablet by mouth daily as needed for constipation.   Taking As Needed   sodium bicarbonate  650 MG tablet Take 1 tablet (650 mg total) by mouth 2 (two) times daily. (Patient taking differently: Take 650 mg by mouth daily.) 100 tablet 1 Taking Differently   sucralfate  (CARAFATE ) 1 g tablet Take 1 tablet (1 g total) by mouth 2 (two) times daily as needed (acid reflux).   Taking As Needed   Vibegron (GEMTESA) 75 MG TABS Take 75 mg by mouth at bedtime.   Taking   zolpidem  (AMBIEN ) 10 MG tablet Take 10 mg by mouth at bedtime as needed for sleep.   Taking As Needed   amLODipine  (NORVASC ) 10 MG tablet Take 1 tablet (10 mg total) by mouth daily. (Patient not taking: Reported on 11/28/2023) 30 tablet 2 Not Taking   BD INSULIN  SYRINGE U/F 31G X 5/16 1 ML MISC USE 1 SYRINGE AS DIRECTED      Continuous Glucose Sensor (FREESTYLE LIBRE 2 SENSOR) MISC       epoetin  alfa-epbx (RETACRIT ) 4000 UNIT/ML injection Inject 1 mL (4,000 Units total) into the skin once a week. (Patient not taking: Reported on 11/28/2023) 4 mL 3 Not Taking   feeding supplement (BOOST HIGH PROTEIN) LIQD Take 1 Container by mouth 2 (two) times daily between meals.      insulin  detemir (LEVEMIR ) 100 UNIT/ML injection Inject 100-120 Units into the skin daily. (Patient not taking: Reported on 11/28/2023)   Not Taking   nystatin  (MYCOSTATIN ) 100000 UNIT/ML suspension Take 5 mLs (500,000 Units total) by mouth 4 (four) times daily. (Patient not taking:  Reported on 11/28/2023) 120 mL 1 Not Taking   ONETOUCH ULTRA test strip 4 (four) times daily.      Social History   Socioeconomic History   Marital status: Married    Spouse name: Not on file   Number of children: 1   Years of education: Not on file   Highest education level: Not on file  Occupational History   Not on file  Tobacco Use   Smoking status: Every Day  Current packs/day: 1.50    Average packs/day: 1.5 packs/day for 50.0 years (75.0 ttl pk-yrs)    Types: Cigarettes   Smokeless tobacco: Never   Tobacco comments:    3ppd x 10 year, then cut back to 1.5pdd since 02/2015  Vaping Use   Vaping status: Never Used  Substance and Sexual Activity   Alcohol use: Never    Alcohol/week: 0.0 standard drinks of alcohol   Drug use: No   Sexual activity: Not Currently    Birth control/protection: Surgical    Comment: Hx Hysterectomy, 1st intercourse 78 yo-Fewer than 5 partners  Other Topics Concern   Not on file  Social History Narrative   Lives at home with husband in a one story home.  Has 1 daughter.     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     Education: some college.   Social Drivers of Corporate investment banker Strain: Low Risk  (10/31/2023)   Received from [facility]   Overall Financial Resource Strain (CARDIA)    Difficulty of Paying Living Expenses: Not hard at all  Food Insecurity: No Food Insecurity (11/28/2023)   Hunger Vital Sign    Worried About Running Out of Food in the Last Year: Never true    Ran Out of Food in the Last Year: Never true  Transportation Needs: No Transportation Needs (11/28/2023)   PRAPARE - Administrator, Civil Service (Medical): No    Lack of Transportation (Non-Medical): No  Physical Activity: Not on file  Stress: Not on file  Social Connections: Unknown (11/28/2023)   Social Connection and Isolation Panel    Frequency of Communication with Friends and Family: More than three times a  week    Frequency of Social Gatherings with Friends and Family: More than three times a week    Attends Religious Services: More than 4 times per year    Active Member of Clubs or Organizations: Not on file    Attends Banker Meetings: Not on file    Marital Status: Not on file  Intimate Partner Violence: Not At Risk (11/28/2023)   Humiliation, Afraid, Rape, and Kick questionnaire    Fear of Current or Ex-Partner: No    Emotionally Abused: No    Physically Abused: No    Sexually Abused: No    Family History  Problem Relation Age of Onset   Hypertension Mother    Heart disease Mother 73       MI   Diabetes Father    Diabetes Sister    Hypertension Sister    Diabetes Brother    Hypertension Brother    Heart disease Brother 7       CAD   Cancer Sister        Multiple myloma   Diabetes Brother      Vitals:   12/12/23 0406 12/12/23 0500 12/12/23 0752 12/12/23 1141  BP: (!) 135/53  (!) 144/51 (!) 134/45  Pulse: 74  73 66  Resp: 17     Temp: 98.6 F (37 C)  98.3 F (36.8 C) 98.3 F (36.8 C)  TempSrc:   Oral   SpO2: 96%  97% 100%  Weight:  74.7 kg    Height:        PHYSICAL EXAM General: well appearing elderly female, well nourished, in no acute distress. HEENT: Normocephalic and atraumatic. Neck: No JVD.   Lungs: Normal respiratory effort on room air. Diminished breath sounds  bilaterally, bibasilar crackles.  Heart: HRRR. Normal S1 and S2, + murmur  Abdomen: Non-distended appearing.  Msk: Normal strength and tone for age. Extremities: Warm and well perfused. No clubbing, cyanosis. 1+ pedal edema bilaterally.  Neuro: Alert and oriented X 3. Psych: Answers questions appropriately.   Labs: Basic Metabolic Panel: Recent Labs    12/11/23 0811 12/12/23 0050  NA 132* 134*  K 3.7 3.6  CL 96* 97*  CO2 27 27  GLUCOSE 222* 165*  BUN 28* 18  CREATININE 2.71* 2.06*  CALCIUM  8.0* 8.0*  PHOS 3.7 2.4*   Liver Function Tests: Recent Labs     12/11/23 0811 12/12/23 0050  ALBUMIN 1.8* 1.8*    No results for input(s): LIPASE, AMYLASE in the last 72 hours. CBC: Recent Labs    12/12/23 0050 12/12/23 0420  WBC 12.3* 12.2*  HGB 7.7* 7.6*  HCT 23.7* 23.9*  MCV 91.5 91.9  PLT 239 264   Cardiac Enzymes: Recent Labs    12/12/23 0050  CKTOTAL 21*   BNP: No results for input(s): BNP in the last 72 hours.  D-Dimer: No results for input(s): DDIMER in the last 72 hours. Hemoglobin A1C: No results for input(s): HGBA1C in the last 72 hours. Fasting Lipid Panel: No results for input(s): CHOL, HDL, LDLCALC, TRIG, CHOLHDL, LDLDIRECT in the last 72 hours. Thyroid Function Tests: No results for input(s): TSH, T4TOTAL, T3FREE, THYROIDAB in the last 72 hours.  Invalid input(s): FREET3  Anemia Panel: No results for input(s): VITAMINB12, FOLATE, FERRITIN, TIBC, IRON, RETICCTPCT in the last 72 hours.   Radiology: PERIPHERAL VASCULAR CATHETERIZATION Result Date: 12/10/2023 See surgical note for result.  ECHO TEE Result Date: 12/08/2023    TRANSESOPHOGEAL ECHO REPORT   Patient Name:   JOLEEN STUCKERT Date of Exam: 12/08/2023 Medical Rec #:  [id number]      Height:       67.0 in Accession #:    [id number]     Weight:       173.7 lb Date of Birth:  1946-04-18      BSA:          1.905 m Patient Age:    78 years       BP:           131/52 mmHg Patient Gender: F              HR:           69 bpm. Exam Location:  [facility] Procedure: Transesophageal Echo, Cardiac Doppler, Color Doppler and 3D Echo            (Both Spectral and Color Flow Doppler were utilized during            procedure). Indications:     Bacteremia R78.81  History:         Patient has prior history of Echocardiogram examinations, most                  recent 12/07/2023. Risk Factors:Hypertension and Sleep Apnea.  Sonographer:     Christopher Furnace Referring Phys:  [id number] DWAYNE D CALLWOOD Diagnosing Phys: Keller Paterson PROCEDURE: After discussion  of the risks and benefits of a TEE, an informed consent was obtained from the patient. The transesophogeal probe was passed without difficulty through the esophogus of the patient. Local oropharyngeal anesthetic was provided with Cetacaine . Sedation performed by different physician. The patient was monitored while under deep sedation. Image quality was excellent. The patient's vital signs; including heart rate,  blood pressure, and oxygen saturation; remained stable throughout the procedure. The patient developed no complications during the procedure.  IMPRESSIONS  1. Left ventricular ejection fraction, by estimation, is 50 to 55%. The left ventricle has low normal function.  2. Right ventricular systolic function is normal. The right ventricular size is normal.  3. No left atrial/left atrial appendage thrombus was detected.  4. The mitral valve is normal in structure. Trivial mitral valve regurgitation.  5. The aortic valve is tricuspid. There is mild thickening of the aortic valve. Aortic valve regurgitation is not visualized.  6. 3D performed of the mitral valve and demonstrates no valvular vegetation. Conclusion(s)/Recommendation(s): No evidence of vegetation/infective endocarditis on this transesophageael echocardiogram. FINDINGS  Left Ventricle: Left ventricular ejection fraction, by estimation, is 50 to 55%. The left ventricle has low normal function. The left ventricular internal cavity size was normal in size. Right Ventricle: The right ventricular size is normal. No increase in right ventricular wall thickness. Right ventricular systolic function is normal. Left Atrium: Left atrial size was normal in size. No left atrial/left atrial appendage thrombus was detected. Right Atrium: Right atrial size was normal in size. Pericardium: There is no evidence of pericardial effusion. Mitral Valve: The mitral valve is normal in structure. Trivial mitral valve regurgitation. Tricuspid Valve: The tricuspid valve is  normal in structure. Tricuspid valve regurgitation is trivial. Aortic Valve: The aortic valve is tricuspid. There is mild thickening of the aortic valve. Aortic valve regurgitation is not visualized. Pulmonic Valve: The pulmonic valve was normal in structure. Pulmonic valve regurgitation is trivial. Aorta: The aortic root is normal in size and structure. IAS/Shunts: No atrial level shunt detected by color flow Doppler. Additional Comments: 3D was performed not requiring image post processing on an independent workstation and was normal. Keller Paterson Electronically signed by Keller Paterson Signature Date/Time: 12/08/2023/5:20:23 PM    Final    ECHOCARDIOGRAM LIMITED Result Date: 12/07/2023    ECHOCARDIOGRAM LIMITED REPORT   Patient Name:   CORALINE TALWAR Date of Exam: 12/07/2023 Medical Rec #:  [id number]      Height:       67.0 in Accession #:    [id number]     Weight:       171.1 lb Date of Birth:  11/14/1945      BSA:          1.892 m Patient Age:    78 years       BP:           147/56 mmHg Patient Gender: F              HR:           62 bpm. Exam Location:  [facility] Procedure: Limited Echo (Both Spectral and Color Flow Doppler were utilized            during procedure). Indications:     Bacteremia R78.81  History:         Patient has prior history of Echocardiogram examinations, most                  recent 11/29/2023.  Sonographer:     Thedora Louder RDCS, FASE Referring Phys:  [phone] DWAYNE D CALLWOOD Diagnosing Phys: Cara JONETTA Lovelace MD  Sonographer Comments: Limited 2D echo requested no spectral or color flow doppler performed on this exam. IMPRESSIONS  1. Aotic valve valve slightly thickened.  2. Mitral valve wth mild thickening.  3. No obvious vegataion seen.  4. Recommed TEE.  5. Left ventricular ejection fraction, by estimation, is 60 to 65%. The left ventricle has normal function. The left ventricle has no regional wall motion abnormalities. Left ventricular diastolic function could not be evaluated.  6.  Right ventricular systolic function is normal. The right ventricular size is normal.  7. The mitral valve is normal in structure.  8. The aortic valve is grossly normal. Aortic valve sclerosis is present, with no evidence of aortic valve stenosis. Conclusion(s)/Recommendation(s): Poor windows for evaluation of left ventricular function by transthoracic echocardiography. Would recommend an alternative means of evaluation. No evidence of valvular vegetations on this transthoracic echocardiogram. Consider a transesophageal echocardiogram to exclude infective endocarditis if clinically indicated. FINDINGS  Left Ventricle: Left ventricular ejection fraction, by estimation, is 60 to 65%. The left ventricle has normal function. The left ventricle has no regional wall motion abnormalities. The left ventricular internal cavity size was normal in size. There is  no left ventricular hypertrophy. Left ventricular diastolic function could not be evaluated. Right Ventricle: The right ventricular size is normal. No increase in right ventricular wall thickness. Right ventricular systolic function is normal. Left Atrium: Left atrial size was normal in size. Right Atrium: Right atrial size was normal in size. Pericardium: There is no evidence of pericardial effusion. Mitral Valve: The mitral valve is normal in structure. There is mild thickening of the mitral valve leaflet(s). There is mild calcification of the mitral valve leaflet(s). Normal mobility of the mitral valve leaflets. Tricuspid Valve: The tricuspid valve is normal in structure. Aortic Valve: The aortic valve is grossly normal. Aortic valve sclerosis is present, with no evidence of aortic valve stenosis. Pulmonic Valve: The pulmonic valve was normal in structure. Aorta: The ascending aorta was not well visualized. Additional Comments: No obvious vegataion seen. Aotic valve valve slightly thickened. Mitral valve wth mild thickening. Recommed TEE.  Dwayne D Callwood MD  Electronically signed by Cara JONETTA Lovelace MD Signature Date/Time: 12/07/2023/1:28:01 PM    Final    DG Chest Port 1 View Result Date: 12/04/2023 CLINICAL DATA:  Fever EXAM: PORTABLE CHEST 1 VIEW COMPARISON:  11/28/2023 FINDINGS: Right internal jugular hemodialysis catheter tips are seen within the superior vena cava. Lungs appear mildly hyperinflated, stable since prior examination, suggesting changes of underlying COPD. Small right pleural effusion again noted, slightly decreased since prior examination. Focal atelectasis or infiltrate within the right costophrenic angle. Mild diffuse interstitial thickening is again noted in keeping with interstitial pulmonary edema or airway inflammation. No pneumothorax. Stable cardiomegaly. No acute bone abnormality. IMPRESSION: 1. Stable cardiomegaly. 2. Small right pleural effusion, slightly decreased since prior examination. 3. Stable interstitial thickening in keeping with interstitial pulmonary edema or airway inflammation. Electronically Signed   By: Dorethia Molt M.D.   On: 12/04/2023 20:43   PERIPHERAL VASCULAR CATHETERIZATION Result Date: 12/01/2023 See surgical note for result.  ECHOCARDIOGRAM COMPLETE Result Date: 11/29/2023    ECHOCARDIOGRAM REPORT   Patient Name:   NYELA CORTINAS Date of Exam: 11/29/2023 Medical Rec #:  [id number]      Height:       67.0 in Accession #:    [id number]     Weight:       176.6 lb Date of Birth:  1945/07/27      BSA:          1.918 m Patient Age:    78 years       BP:           163/51 mmHg Patient Gender: F  HR:           81 bpm. Exam Location:  [facility] Procedure: 2D Echo, Cardiac Doppler and Color Doppler (Both Spectral and Color            Flow Doppler were utilized during procedure). Indications:     CHF I50.31  History:         Patient has prior history of Echocardiogram examinations, most                  recent 07/23/2023.  Sonographer:     Thedora Louder RDCS, FASE Referring Phys:  [id number] ANTHONY CHRISTELLA POUCH  Diagnosing Phys: Dwayne D Callwood MD IMPRESSIONS  1. Left ventricular ejection fraction, by estimation, is 55 to 60%. The left ventricle has normal function. The left ventricle has no regional wall motion abnormalities. The left ventricular internal cavity size was moderately dilated. Left ventricular diastolic parameters are consistent with Grade II diastolic dysfunction (pseudonormalization).  2. Right ventricular systolic function is normal. The right ventricular size is normal.  3. Left atrial size was mildly dilated.  4. The mitral valve is normal in structure. Trivial mitral valve regurgitation.  5. The aortic valve is normal in structure. Aortic valve regurgitation is not visualized. FINDINGS  Left Ventricle: Left ventricular ejection fraction, by estimation, is 55 to 60%. The left ventricle has normal function. The left ventricle has no regional wall motion abnormalities. Strain was performed and the global longitudinal strain is indeterminate. The left ventricular internal cavity size was moderately dilated. There is no left ventricular hypertrophy. Left ventricular diastolic parameters are consistent with Grade II diastolic dysfunction (pseudonormalization). Right Ventricle: The right ventricular size is normal. No increase in right ventricular wall thickness. Right ventricular systolic function is normal. Left Atrium: Left atrial size was mildly dilated. Right Atrium: Right atrial size was normal in size. Pericardium: There is no evidence of pericardial effusion. Mitral Valve: The mitral valve is normal in structure. Trivial mitral valve regurgitation. Tricuspid Valve: The tricuspid valve is normal in structure. Tricuspid valve regurgitation is mild. Aortic Valve: The aortic valve is normal in structure. Aortic valve regurgitation is not visualized. Aortic valve peak gradient measures 8.8 mmHg. Pulmonic Valve: The pulmonic valve was normal in structure. Pulmonic valve regurgitation is not visualized.  Aorta: The ascending aorta was not well visualized. IAS/Shunts: No atrial level shunt detected by color flow Doppler. Additional Comments: 3D was performed not requiring image post processing on an independent workstation and was indeterminate.  LEFT VENTRICLE PLAX 2D LVIDd:         5.50 cm     Diastology LVIDs:         3.80 cm     LV e' medial:    7.18 cm/s LV PW:         1.00 cm     LV E/e' medial:  19.9 LV IVS:        1.10 cm     LV e' lateral:   8.38 cm/s LVOT diam:     1.80 cm     LV E/e' lateral: 17.1 LV SV:         63 LV SV Index:   33 LVOT Area:     2.54 cm  LV Volumes (MOD) LV vol d, MOD A2C: 74.1 ml LV vol d, MOD A4C: 70.9 ml LV vol s, MOD A2C: 37.0 ml LV vol s, MOD A4C: 28.8 ml LV SV MOD A2C:     37.1 ml LV SV MOD A4C:  70.9 ml LV SV MOD BP:      41.0 ml RIGHT VENTRICLE RV Basal diam:  3.20 cm RV S prime:     14.80 cm/s TAPSE (M-mode): 2.6 cm LEFT ATRIUM             Index        RIGHT ATRIUM           Index LA diam:        4.30 cm 2.24 cm/m   RA Area:     19.70 cm LA Vol (A2C):   44.9 ml 23.41 ml/m  RA Volume:   55.40 ml  28.88 ml/m LA Vol (A4C):   53.8 ml 28.05 ml/m LA Biplane Vol: 51.9 ml 27.06 ml/m  AORTIC VALVE                 PULMONIC VALVE AV Area (Vmax): 1.84 cm     PV Vmax:        1.01 m/s AV Vmax:        148.00 cm/s  PV Peak grad:   4.1 mmHg AV Peak Grad:   8.8 mmHg     RVOT Peak grad: 3 mmHg LVOT Vmax:      107.00 cm/s LVOT Vmean:     76.000 cm/s LVOT VTI:       0.249 m  AORTA Ao Root diam: 2.90 cm MITRAL VALVE MV Area (PHT): 4.86 cm     SHUNTS MV Decel Time: 156 msec     Systemic VTI:  0.25 m MV E velocity: 143.00 cm/s  Systemic Diam: 1.80 cm MV A velocity: 80.30 cm/s MV E/A ratio:  1.78 Dwayne D Callwood MD Electronically signed by Cara JONETTA Lovelace MD Signature Date/Time: 11/29/2023/10:33:04 AM    Final    US  Venous Img Lower Bilateral Result Date: 11/28/2023 CLINICAL DATA:  New onset left greater than right lower extremity swelling, without findings of CHF. EXAM: BILATERAL  LOWER EXTREMITY VENOUS DOPPLER ULTRASOUND TECHNIQUE: Gray-scale sonography with graded compression, as well as color Doppler and duplex ultrasound were performed to evaluate the lower extremity deep venous systems from the level of the common femoral vein and including the common femoral, femoral, profunda femoral, popliteal and calf veins including the posterior tibial, peroneal and gastrocnemius veins when visible. The superficial great saphenous vein was also interrogated. Spectral Doppler was utilized to evaluate flow at rest and with distal augmentation maneuvers in the common femoral, femoral and popliteal veins. COMPARISON:  Left lower extremity DVT exam 08/26/2019. FINDINGS: RIGHT LOWER EXTREMITY Common Femoral Vein: No evidence of thrombus. Normal compressibility, respiratory phasicity and response to augmentation. Saphenofemoral Junction: No evidence of thrombus. Normal compressibility and flow on color Doppler imaging. Profunda Femoral Vein: No evidence of thrombus. Normal compressibility and flow on color Doppler imaging. Femoral Vein: No evidence of thrombus. Normal compressibility, respiratory phasicity and response to augmentation. Popliteal Vein: No evidence of thrombus. Normal compressibility, respiratory phasicity and response to augmentation. Calf Veins: No evidence of thrombus. Normal compressibility and flow on color Doppler imaging. Superficial Great Saphenous Vein: No evidence of thrombus. Normal compressibility. Venous Reflux:  None. Other Findings:  Pulsatile venous waveforms. LEFT LOWER EXTREMITY Common Femoral Vein: No evidence of thrombus. Normal compressibility, respiratory phasicity and response to augmentation. Saphenofemoral Junction: No evidence of thrombus. Normal compressibility and flow on color Doppler imaging. Profunda Femoral Vein: No evidence of thrombus. Normal compressibility and flow on color Doppler imaging. Femoral Vein: No evidence of thrombus. Normal compressibility,  respiratory phasicity and response to  augmentation. Popliteal Vein: No evidence of thrombus. Normal compressibility, respiratory phasicity and response to augmentation. Calf Veins: No evidence of thrombus. Normal compressibility and flow on color Doppler imaging. Superficial Great Saphenous Vein: No evidence of thrombus. Normal compressibility. Venous Reflux:  None. Other Findings:  Pulsatile venous waveforms. IMPRESSION: 1. No evidence of deep venous thrombosis in either lower extremity. 2. Pulsatile venous waveforms, which can be seen with right heart failure or tricuspid regurgitation. Electronically Signed   By: Francis Quam M.D.   On: 11/28/2023 04:56   DG Chest Portable 1 View Result Date: 11/28/2023 CLINICAL DATA:  Difficulty breathing EXAM: PORTABLE CHEST 1 VIEW COMPARISON:  07/19/2023 FINDINGS: Cardiac shadow is mildly enlarged. Aortic calcifications are seen. Mild vascular congestion is noted. Small right-sided pleural effusion is noted. Metallic densities are noted symmetrically over the upper chest likely artifactual in nature. IMPRESSION: Small right pleural effusion and vascular congestion. Electronically Signed   By: Oneil Devonshire M.D.   On: 11/28/2023 02:59    ECHO as above  TELEMETRY reviewed by me 12/12/2023: Atrial fibrillation, rate 90s to 100s. (This AM) Sinus rhythm, rate 60s (afternoon, converted around 12PM)  EKG reviewed by me: Normal sinus rhythm, mild right axis nonspecific ST-T wave changes   Data reviewed by me 12/12/2023: last 24h vitals tele labs imaging I/O hospitalist progress note, nephrology notes.  Principal Problem:   Hypertensive urgency/emergency Active Problems:   Hyponatremia   CAD (coronary artery disease)   COPD with acute exacerbation (HCC)   Acute renal failure superimposed on stage 4 chronic kidney disease (HCC)   MRSA (methicillin resistant Staphylococcus aureus) septicemia (HCC)   Chest pain   Hypertensive emergency   Acute dyspnea   Encounter  for dialysis catheter care (HCC)   Paroxysmal atrial fibrillation (HCC)   ESRD (end stage renal disease) (HCC)   Acute on chronic diastolic CHF (congestive heart failure) (HCC)    ASSESSMENT AND PLAN:  JANIECE SCOVILL is a 78 y.o. female  with a past medical history of coronary artery disease, hypertension, hyperlipidemia, COPD, OSA, hx CVA, CKD stage 4, SIADH, diabetes mellitus who presented to the ED on 11/28/2023 for worsening and persistent SOB and lower extremity swelling with concurrent COPD exacerbation and found elevated BNP of 2500. Cardiology was consulted for further evaluation.   # New AF RVR Patient developed AF RVR 140s early this morning (06/23). Per tele s/p IV dilt 10 mg HF improved to 110s-120s. Patient converted to SR with IV dilt gtt on 06/23. Per tele back in atrial fibrillation with rate 90-100s (06/26). Converted to SR with rates in 60s on 06/26 at 12PM. Now remaining in sinus rhythm. Patient reports 2 episodes of nose bleeds on 06/26, today patient reports no significant nosebleeds. -Continue p.o. Amio 400 mg twice daily for 10 days, then 200 mg daily for rhythm control -Continue diltiazem  to 120 mg daily. -Continue Eliquis  2.5 mg BID for stroke risk reduction.  -CHA2DS2VASc score of 9  (in the setting of age, sex, CHF, HTN, Stroke history, vascular disease, and diabetes history).   # MRSA bacteremia Blood cultures positive for MRSA. TEE (06/23) revealed no evidence of vegetation/infective Endocarditis.Repeat blood cultures with no growth.  -IV broad spectrum antibiotics per primary team.  -Infectious disease following, appreciate recommendations.  # ESRD (Started dialysis on 06/16) Permacath placement on 06/17 by Dr. Marea. Patient underwent dialysis, tolerated well. Permacath removed due to positive MRSA blood cultures.  Repeat blood cultures have shown no growth. Replaced permacath (06/25).  Patient  undergoing hemodialysis today. -Nephrology following, appreciate  recommendations.   # Acute HFpEF exacerbation # COPD exacerbation  # OSA Patient presents with worsening SOB and LE swelling. BNP elevated at 2450. Echo this admission with pEF, no RWMA, grade II diastolic dysfunction.  -Continue metoprolol  succinate 100 mg daily.  -Continue losartan  as stated below.  -Fluid removal via dialysis.  -GDMT optimization limited due to ESRD.  -Recommend sleep study for OSA if CPAP indicated.  -Recommend breathing treatments and frequent use of incentive spirometry for COPD. Management per primary. -Recommend pulmonology consultation.   # Coronary artery disease # Hypertension # Hyperlipidemia # Demand ischemia Patient without chest pain. Trops minimally elevated and flat 19 > 20. EKG without acute ischemic changes. Echo with no RWMA.   -Continue ASA 81 mg, rosuvastatin  5 mg daily -Continue losartan  100 mg daily. -Continue clonidine  0.2 mg BID.  -Continue metoprolol  as stated above. -Minimally elevated and flat trop sin setting of ESRD, heart failure exacerbation is most consistent with demand/supply mismatch and not ACS.  Plan for discharge to rehab tomorrow.  No further cardiac recommendations.  Will sign off at this time.  Will arrange for follow up in clinic with Dr. Ammon in 1-2 weeks.   This patient's plan of care was discussed and created with Dr. Florencio and he is in agreement.  Signed: Dorene Comfort, PA-C  12/12/2023, 2:08 PM [facility]

## 2023-12-12 NOTE — Plan of Care (Signed)

## 2023-12-12 NOTE — Treatment Plan (Signed)
 Infectious Disease Long Term IV Antibiotic Orders Mckenzie [facility] 07/18/1945  Diagnosis: MRSA bacteremia, HD cath infection, spinal hardware. Prior MRSA infections  Culture results [facility]   LABS Lab Results  Component Value Date   CREATININE 2.06 (H) 12/12/2023   Lab Results  Component Value Date   WBC 12.2 (H) 12/12/2023   HGB 7.6 (L) 12/12/2023   HCT 23.9 (L) 12/12/2023   MCV 91.9 12/12/2023   PLT 264 12/12/2023   Lab Results  Component Value Date   ESRSEDRATE 127 (H) 12/10/2023   Lab Results  Component Value Date   CRP 15.4 (H) 12/09/2023    Allergies:  Allergies  Allergen Reactions   Iodine Anaphylaxis and Other (See Comments)    Pt states that she is allergic to ingested iodine only, okay for betadine.     Iodine I-131 Tositumomab Anaphylaxis   Shellfish Allergy Anaphylaxis   Codeine Nausea And Vomiting   Morphine  Sulfate Nausea And Vomiting   Irbesartan Other (See Comments)     Unknown  (Avapro)   Sulfa Antibiotics Other (See Comments)    Fever     Discharge antibiotics Vancomycin                dosing at HD  PICC Care per protocol Labs weekly while on IV antibiotics -FAX weekly labs to [id number]  CBC w diff   Comprehensive met panel Vancomycin  Trough   CRP   Planned duration of antibiotics 4 weeks from first neg bcx after line removed   Stop date July 20  Follow up clinic date TBD   Alm SHAUNNA Needle, MD

## 2023-12-12 NOTE — TOC Progression Note (Addendum)
 Transition of Care [facility]) - Progression Note    Patient Details  Name: Mckenzie [facility] MRN: [id number] Date of Birth: [birth year]  [facility]) CM/SW Contact  Lauraine JAYSON Carpen, LCSW Phone Number: 12/12/2023, 12:13 PM  Clinical Narrative:  [facility] confirmed that they can transport to HD on TTS schedule and can take her tomorrow after HD. Her HD center cannot start on the weekend. Admissions coordinator is aware that daughter has submitted paperwork to close liability claim. Patient and daughter are agreeable to plan. SNF will need dc summary no later than 1:00 tomorrow. MD is aware. CSW started insurance authorization for SNF and ambulance transport. Daughter does not feel comfortable transporting by private vehicle, especially after HD tomorrow.   3:11 pm: Auth approved for [facility]: [id number]. Auth for ambulance transport is under medical review.  4:03 pm: Per daughter, patient cannot move out of chair or into chair without assistance. Patient is also on acute oxygen. CSW left voicemail for [employer] liaison to notify so they can add this information to the authorization request.  Expected Discharge Plan and Services                                               Social Determinants of Health (SDOH) Interventions SDOH Screenings   Food Insecurity: No Food Insecurity (11/28/2023)  Housing: Low Risk  (11/28/2023)  Transportation Needs: No Transportation Needs (11/28/2023)  Utilities: Not At Risk (11/28/2023)  Depression (PHQ2-9): Low Risk  (08/05/2023)  Financial Resource Strain: Low Risk  (10/31/2023)   Received from [facility]  Social Connections: Unknown (11/28/2023)  Tobacco Use: High Risk (12/08/2023)    Readmission Risk Interventions    07/22/2023   10:56 AM 08/13/2022    1:04 PM 11/10/2021    2:49 PM  Readmission Risk Prevention Plan  Transportation Screening Complete Complete Complete  PCP or Specialist Appt within 5-7  Days  Complete   PCP or Specialist Appt within 3-5 Days Complete  Complete  Home Care Screening  Complete   Medication Review (RN CM)  Complete   HRI or Home Care Consult Complete  Complete  Social Work Consult for Recovery Care Planning/Counseling Complete  Complete  Palliative Care Screening Not Applicable  Not Applicable  Medication Review Oceanographer) Complete  Complete

## 2023-12-13 DIAGNOSIS — N186 End stage renal disease: Secondary | ICD-10-CM | POA: Diagnosis not present

## 2023-12-13 DIAGNOSIS — I160 Hypertensive urgency: Secondary | ICD-10-CM | POA: Diagnosis not present

## 2023-12-13 DIAGNOSIS — J441 Chronic obstructive pulmonary disease with (acute) exacerbation: Secondary | ICD-10-CM | POA: Diagnosis not present

## 2023-12-13 LAB — CBC
HCT: 23 % — ABNORMAL LOW (ref 36.0–46.0)
Hemoglobin: 7.3 g/dL — ABNORMAL LOW (ref 12.0–15.0)
MCH: 29.4 pg (ref 26.0–34.0)
MCHC: 31.7 g/dL (ref 30.0–36.0)
MCV: 92.7 fL (ref 80.0–100.0)
Platelets: 279 10*3/uL (ref 150–400)
RBC: 2.48 MIL/uL — ABNORMAL LOW (ref 3.87–5.11)
RDW: 14.6 % (ref 11.5–15.5)
WBC: 11.8 10*3/uL — ABNORMAL HIGH (ref 4.0–10.5)
nRBC: 0 % (ref 0.0–0.2)

## 2023-12-13 LAB — RENAL FUNCTION PANEL
Albumin: 1.9 g/dL — ABNORMAL LOW (ref 3.5–5.0)
Anion gap: 12 (ref 5–15)
BUN: 29 mg/dL — ABNORMAL HIGH (ref 8–23)
CO2: 25 mmol/L (ref 22–32)
Calcium: 8.4 mg/dL — ABNORMAL LOW (ref 8.9–10.3)
Chloride: 94 mmol/L — ABNORMAL LOW (ref 98–111)
Creatinine, Ser: 3.06 mg/dL — ABNORMAL HIGH (ref 0.44–1.00)
GFR, Estimated: 15 mL/min — ABNORMAL LOW (ref >60)
Glucose, Bld: 143 mg/dL — ABNORMAL HIGH (ref 70–99)
Phosphorus: 3.8 mg/dL (ref 2.5–4.6)
Potassium: 3.6 mmol/L (ref 3.5–5.1)
Sodium: 131 mmol/L — ABNORMAL LOW (ref 135–145)

## 2023-12-13 LAB — GLUCOSE, CAPILLARY
Glucose-Capillary: 131 mg/dL — ABNORMAL HIGH (ref 70–99)
Glucose-Capillary: 91 mg/dL (ref 70–99)
Glucose-Capillary: 212 mg/dL — ABNORMAL HIGH (ref 70–99)
Glucose-Capillary: 192 mg/dL — ABNORMAL HIGH (ref 70–99)

## 2023-12-13 LAB — HEPATITIS B CORE ANTIBODY, TOTAL: HEP B CORE AB: NEGATIVE

## 2023-12-13 LAB — PREPARE RBC (CROSSMATCH)

## 2023-12-13 LAB — VANCOMYCIN, RANDOM: Vancomycin Rm: 20 ug/mL

## 2023-12-13 MED ORDER — EPOETIN ALFA-EPBX 4000 UNIT/ML IJ SOLN
INTRAMUSCULAR | Status: AC
  Filled 2023-12-13: qty 1

## 2023-12-13 MED ORDER — SODIUM CHLORIDE 0.9% IV SOLUTION: Freq: Once | INTRAVENOUS | Status: DC

## 2023-12-13 MED ORDER — VANCOMYCIN HCL 1250 MG/250ML IV SOLN
1250.0000 mg | Freq: Once | INTRAVENOUS | Status: AC
  Administered 2023-12-13: 1250 mg via INTRAVENOUS
  Filled 2023-12-13: qty 250

## 2023-12-13 MED ORDER — MAGIC MOUTHWASH
5.0000 mL | Freq: Four times a day (QID) | ORAL | Status: DC | PRN
  Administered 2023-12-13 – 2023-12-15 (×6): 5 mL via ORAL
  Filled 2023-12-13 (×8): qty 5

## 2023-12-13 MED ORDER — LACTULOSE 10 GM/15ML PO SOLN
20.0000 g | Freq: Once | ORAL | Status: AC
  Administered 2023-12-13: 20 g via ORAL
  Filled 2023-12-13: qty 30

## 2023-12-13 MED ORDER — VANCOMYCIN HCL IN DEXTROSE 1-5 GM/200ML-% IV SOLN: 1000.0000 mg | Freq: Once | INTRAVENOUS | Status: DC

## 2023-12-13 MED ORDER — VANCOMYCIN IV (FOR PTA / DISCHARGE USE ONLY)
1000.0000 mg | INTRAVENOUS | Status: DC
Start: 2023-12-16 — End: 2024-01-04

## 2023-12-13 NOTE — Progress Notes (Signed)
 Patient states she does not to have 12 AM VS taken. Requests to sleep and only come in room if patient calls. Patient did agree to 4 AM VS

## 2023-12-13 NOTE — Progress Notes (Signed)
 PT Cancellation Note  Patient Details Name: Mckenzie [facility] MRN: [id number] DOB: 1946-03-03   Cancelled Treatment:     PT attempt. Pt off floor for HD. Acute PT will continue to follow per current POC progressing as able per pt tolerance. Will return at a later time/date when available to participate.    [name] 12/13/2023, 12:40 PM

## 2023-12-13 NOTE — Plan of Care (Signed)
  Problem: Health Behavior/Discharge Planning: Goal: Ability to identify and utilize available resources and services will improve Outcome: Progressing   Problem: Nutritional: Goal: Maintenance of adequate nutrition will improve Outcome: Progressing   Problem: Health Behavior/Discharge Planning: Goal: Ability to manage health-related needs will improve Outcome: Progressing

## 2023-12-13 NOTE — Progress Notes (Signed)
 Progress Note   Patient: Mckenzie [facility] FMW:[id number] DOB: 25-Sep-1945 DOA: 11/28/2023     15 DOS: the patient was seen and examined on 12/13/2023   Brief hospital course: SEYNABOU FULTS is a 78 y.o. female with medical history significant for COPD, CVA, stage 4 CKD, SIADH, esophageal dysphagia treated with Botox  (last in 05/2023), DM, HLD, and HTN, last hospitalized from 2/1 to 07/26/2023 with MRSA bacteremia/sepsis/multifocal pneumonia, now presenting by EMS with shortness of breath and chest pressure.   She has no orthopnea and sleeps on 1 pillow.  She has a chronic congested cough that has not changed and has no fever or chills  BP on arrival 202/74 and tachypneic to 25 saturating at 98% on room air. WBC 11,000 with hemoglobin of 9 which is her baseline. Troponin 19 and BNP 2450 Creatinine 3.59 up from baseline of 2.74 with bicarb 19.  Glucose 207, potassium 5.2, sodium 123 (137 four months ago) EKG with sinus rhythm at 79 Chest x-ray showing small right pleural effusion and vascular congestion Patient treated with DuoNeb and Solu-Medrol  and given a dose of Lasix  Patient was diagnosed with acute on chronic diastolic congestive heart failure, she also had progressed to end-stage renal disease.  She was treated with initially with IV Lasix , then hemodialysis was started on 6/17. Blood culture positive again for MRSA, started on vancomycin .     Principal Problem:   Hypertensive urgency/emergency Active Problems:   COPD with acute exacerbation (HCC)   Acute dyspnea   CAD (coronary artery disease)   Chest pain   Acute renal failure superimposed on stage 4 chronic kidney disease (HCC)   Hyponatremia   MRSA (methicillin resistant Staphylococcus aureus) septicemia (HCC)   Hypertensive emergency   Encounter for dialysis catheter care (HCC)   Paroxysmal atrial fibrillation (HCC)   ESRD (end stage renal disease) (HCC)   Acute on chronic diastolic CHF (congestive heart failure)  (HCC)   Assessment and Plan: MRSA septicemia:  Patient had a fever of 102.4 on 6/19, at the same time, heart rate of 92, leukocytosis of 12.1. Blood culture was positive for MRSA. Hx mrsa bacteremia 2/24 2/2 infected spinal hardware, had recurrent bout of mrsa bacteremia earlier this year no source identified. Has dialysis catheter placed this hospitalization, removed by vascular on 6/21. PIV also exchanged Repeat blood cultures ordered 6/22, ngtd. TTE/TEE negative. ID following.  Planning for vancomycin  for the least 4 weeks, too early to perform another scan of the spine. Discussed with nephrology, micrology still working on MIC for daptomycin  and compared to vancomycin .  Results will be available on Monday.  Will decide if patient need vancomycin  versus daptomycin  on Monday.  Patient also need a preauthorization from insurance company for daptomycin .  After discussion with nephrology and patient daughter, we decided to keep patient until Monday.   Paroxysmal a-fib:  new overnight 6/22, now back in sinus.  Patient started on Eliquis , started having nasal bleeding.  Decrease dose to 2.5 mg twice a day due to end-stage renal disease 6/26. Nasal bleeding has resolved. Patient heart rate well-controlled.   Hypertensive emergency:  Blood pressure well-controlled.   Acute on chronic diastolic CHF:  w/ elevated BNP, CXR showing vascular congestion, pitting LE edema & dyspnea. Echo shows EF 50-55%, grade II diastolic dysfunction, no regional wall motion abnormalities. Volume status maintained by hemodialysis.   COPD exacerbation:  Treated with steroids, condition has stabilized.  However, patient still having significant cough with large amount of mucus.  DuoNeb scheduled. Patient  has off oxygen.   Chest pain: w/ hx of CAD. Troponins are minimally elevated.  EKG shows no ischemic changes. Continue on losartan , metoprolol , statin, aspirin . No CP currently    ESRD progressing from  CKDIV Hyperkalemia secondary to end-stage renal disease. Hyponatremia. Anemia of chronic kidney disease. : tunneled HD cath placed. Started on scheduled HD 12/02/23.  Permacath placed on 6/25. Patient has significant anemia, hemoglobin dropped down to 6.3 today, received 1 unit PRBC, repeat hemoglobin today 7.6.  Recent iron study in 07/2023 showed a normal iron and B12 level.  No evidence of active bleeding. Patient is now on scheduled dialysis today, next session on Tuesday.   Anxiety: severity unknown. Valium  discontinued 6/19    Esophageal dysphagia: s/p botox  via EGD in 08/2022. Aspiration precautions. Continue w/ supportive care   OSA: CPAP qhs   DM2: well controlled at baseline, HbA1c 6.1. sugars labile here, on semglee /ssi   Debility: PT rec is now snf, toc consulted.     Constipation. Last documented bowel movement was 6/23.  Started on senna and lactulose  6/27.  Will give another dose of lactulose  today.        Subjective:  Patient doing well today, no short of breath, off oxygen.  Physical Exam: Vitals:   12/13/23 1130 12/13/23 1200 12/13/23 1226 12/13/23 1227  BP: 138/72 (!) 154/68 (!) 149/66 (!) 149/66  Pulse: (!) 102 74 (!) 102 (!) 103  Resp: (!) 28 (!) 31 (!) 27 (!) 33  Temp:   98.8 F (37.1 C)   TempSrc:   Oral   SpO2: 96% 95% 96% 96%  Weight:      Height:       General exam: Appears calm and comfortable  Respiratory system: Decreased breath sounds. Respiratory effort normal. Cardiovascular system: Appear regular. No JVD, murmurs, rubs, gallops or clicks. No pedal edema. Gastrointestinal system: Abdomen is nondistended, soft and nontender. No organomegaly or masses felt. Normal bowel sounds heard. Central nervous system: Alert and oriented x3. No focal neurological deficits. Extremities: Symmetric 5 x 5 power. Skin: No rashes, lesions or ulcers Psychiatry: Mood & affect appropriate.    Data Reviewed:  Lab results reviewed.  Family Communication:  Daughter updated.  Disposition: Status is: Inpatient Remains inpatient appropriate because: Severity of disease.  IV treatment.     Time spent: 55 minutes  Author: Murvin Mana, MD 12/13/2023 12:50 PM  For on call review [email or web address].

## 2023-12-13 NOTE — TOC Progression Note (Signed)
 Transition of Care [facility]) - Progression Note    Patient Details  Name: MARIAN MENEELY MRN: [id number] Date of Birth: 1946-01-07  [facility]) CM/SW Contact  Delphine KANDICE Bring, RN Phone Number: 12/13/2023, 4:34 PM  Clinical Narrative:    CM received a denial from [employer] for non-emergency from hospital to SNF. CM called patient room but was informed to call her daughter Sunday . CM called daughter to inform her of the denial. She asked what will be the options CM informed her that it will be private pay. She asked for the cost.  CM explained that we will have to call to ge the price. Daughter asked for a copy of the denial to be left in her mother's room.         Expected Discharge Plan and Services                                               Social Determinants of Health (SDOH) Interventions SDOH Screenings   Food Insecurity: No Food Insecurity (11/28/2023)  Housing: Low Risk  (11/28/2023)  Transportation Needs: No Transportation Needs (11/28/2023)  Utilities: Not At Risk (11/28/2023)  Depression (PHQ2-9): Low Risk  (08/05/2023)  Financial Resource Strain: Low Risk  (10/31/2023)   Received [facility]  Social Connections: Unknown (11/28/2023)  Tobacco Use: High Risk (12/08/2023)    Readmission Risk Interventions    07/22/2023   10:56 AM 08/13/2022    1:04 PM 11/10/2021    2:49 PM  Readmission Risk Prevention Plan  Transportation Screening Complete Complete Complete  PCP or Specialist Appt within 5-7 Days  Complete   PCP or Specialist Appt within 3-5 Days Complete  Complete  Home Care Screening  Complete   Medication Review (RN CM)  Complete   HRI or Home Care Consult Complete  Complete  Social Work Consult for Recovery Care Planning/Counseling Complete  Complete  Palliative Care Screening Not Applicable  Not Applicable  Medication Review Oceanographer) Complete  Complete

## 2023-12-13 NOTE — Progress Notes (Signed)
 [facility]  ROUNDING NOTE   Subjective:  Mckenzie Lawrence is a 78 y.o.  female with past medical history of hypertension, long-standing diabetes mellitus type 2, lower extremity edema, hyperlipidemia, tobacco abuse, prior history of CVA, COPD, obstructive sleep apnea, lumbar spinal fusion, left upper lobe lung mass who presents with volume overload and increasing shortness of breath.   Update:  Patient seen and evaluated during dialysis   HEMODIALYSIS FLOWSHEET:  Blood Flow Rate (mL/min): 400 mL/min Arterial Pressure (mmHg): -176.55 mmHg Venous Pressure (mmHg): 154.94 mmHg TMP (mmHg): 1.21 mmHg Ultrafiltration Rate (mL/min): 543 mL/min Dialysate Flow Rate (mL/min): 299 ml/min Dialysis Fluid Bolus: Normal Saline  Tolerating treatment well seated in chair   Objective:  Vital signs in last 24 hours:  Temp:  [98.1 F (36.7 C)-99 F (37.2 C)] 98.4 F (36.9 C) (06/28 0953) Pulse Rate:  [65-111] 111 (06/28 0953) Resp:  [15-28] 28 (06/28 0953) BP: (130-158)/(40-73) 158/73 (06/28 0953) SpO2:  [94 %-100 %] 96 % (06/28 0930) Weight:  [75 kg-79.8 kg] 79.8 kg (06/28 0837)  Weight change: 0.4 kg Filed Weights   12/12/23 0500 12/13/23 0500 12/13/23 0837  Weight: 74.7 kg 75 kg 79.8 kg    Intake/Output: I/O last 3 completed shifts: In: 0  Out: 100 [Urine:100]   Intake/Output this shift:  No intake/output data recorded.  Physical Exam: General: No acute distress  Head: Normocephalic, atraumatic. Dry mucosal membranes  Eyes: Anicteric  Neck: Supple  Lungs:  Normal breathing effort  Heart: Regular rate and rhythm  Abdomen:  Soft, nontender, BS present  Extremities: No peripheral edema.  Neurologic: Alert, moving all four extremities  Skin: No lesions  Dialysis Access: Right IJ PermCath placed on 6/25    Basic Metabolic Panel: Recent Labs  Lab 12/07/23 1219 12/09/23 0509 12/10/23 1400 12/11/23 0811 12/12/23 0050 12/13/23 0853  NA 132* 132* 127* 132*  134* 131*  K 3.5 3.8 3.6 3.7 3.6 3.6  CL 94* 95* 91* 96* 97* 94*  CO2 27 26 24 27 27 25   GLUCOSE 238* 218* 238* 222* 165* 143*  BUN 28* 41* 51* 28* 18 29*  CREATININE 2.65* 3.78* 4.46* 2.71* 2.06* 3.06*  CALCIUM  7.7* 8.1* 7.8* 8.0* 8.0* 8.4*  PHOS 2.8  --  5.4* 3.7 2.4* 3.8    Liver Function Tests: Recent Labs  Lab 12/07/23 1219 12/10/23 1400 12/11/23 0811 12/12/23 0050 12/13/23 0853  ALBUMIN 1.8* 1.7* 1.8* 1.8* 1.9*   No results for input(s): LIPASE, AMYLASE in the last 168 hours. No results for input(s): AMMONIA in the last 168 hours.  CBC: Recent Labs  Lab 12/10/23 0326 12/11/23 0336 12/11/23 1155 12/12/23 0050 12/12/23 0420 12/13/23 0610  WBC 13.8* 10.6*  --  12.3* 12.2* 11.8*  HGB 6.3* 7.1* 7.6* 7.7* 7.6* 7.3*  HCT 19.8* 22.2*  --  23.7* 23.9* 23.0*  MCV 92.5 92.5  --  91.5 91.9 92.7  PLT 212 218  --  239 264 279    Cardiac Enzymes: Recent Labs  Lab 12/12/23 0050  CKTOTAL 21*    BNP: Invalid input(s): POCBNP  CBG: Recent Labs  Lab 12/12/23 0753 12/12/23 1143 12/12/23 1642 12/12/23 2044 12/13/23 0809  GLUCAP 129* 141* 211* 206* 131*    Microbiology: Results for orders placed or performed during the hospital encounter of 11/28/23  Culture, blood (Routine X 2) w Reflex to ID Panel     Status: Abnormal (Preliminary result)   Collection Time: 12/04/23  6:34 PM   Specimen: BLOOD  Result Value Ref Range Status   Specimen Description   Final    BLOOD BLOOD RIGHT ARM Performed at [facility], [address] [place], [place] [address]    Special Requests   Final    BOTTLES DRAWN AEROBIC AND ANAEROBIC Blood Culture adequate volume Performed at [facility], [address] [place], [place] [address]    Culture  Setup Time   Final    Organism ID to follow IN BOTH AEROBIC AND ANAEROBIC BOTTLES GRAM POSITIVE COCCI CRITICAL RESULT CALLED TO, READ BACK BY AND VERIFIED WITH: SELINDA SIMPERS 12/06/2023 AT 9351  SRR Performed at [facility], [address] [place], [place] [address]    Culture (A)  Final    METHICILLIN RESISTANT STAPHYLOCOCCUS AUREUS Sent to [facility] for further susceptibility testing. Performed at [facility], [address] [place], [place] [address]    Report Status PENDING  Incomplete   Organism ID, Bacteria METHICILLIN RESISTANT STAPHYLOCOCCUS AUREUS  Final      Susceptibility   Methicillin resistant staphylococcus aureus - MIC*    CIPROFLOXACIN  >=8 RESISTANT Resistant     ERYTHROMYCIN  >=8 RESISTANT Resistant     GENTAMICIN  <=0.5 SENSITIVE Sensitive     OXACILLIN >=4 RESISTANT Resistant     TETRACYCLINE <=1 SENSITIVE Sensitive     VANCOMYCIN  2 SENSITIVE Sensitive     TRIMETH/SULFA <=10 SENSITIVE Sensitive     CLINDAMYCIN <=0.25 SENSITIVE Sensitive     RIFAMPIN <=0.5 SENSITIVE Sensitive     Inducible Clindamycin NEGATIVE Sensitive     LINEZOLID  2 SENSITIVE Sensitive     * METHICILLIN RESISTANT STAPHYLOCOCCUS AUREUS  Culture, blood (Routine X 2) w Reflex to ID Panel     Status: Abnormal   Collection Time: 12/04/23  6:34 PM   Specimen: BLOOD  Result Value Ref Range Status   Specimen Description   Final    BLOOD BLOOD LEFT ARM Performed at [facility], [address] [place], [place] [address]    Special Requests   Final    BOTTLES DRAWN AEROBIC AND ANAEROBIC Blood Culture adequate volume Performed at [facility], [address] [place], [place] [address]    Culture  Setup Time   Final    GRAM POSITIVE COCCI IN BOTH AEROBIC AND ANAEROBIC BOTTLES CRITICAL VALUE NOTED.  VALUE IS CONSISTENT WITH PREVIOUSLY REPORTED AND CALLED VALUE. Performed at [facility], [address] [place], [place] [address]    Culture (A)  Final    STAPHYLOCOCCUS AUREUS SUSCEPTIBILITIES PERFORMED ON PREVIOUS CULTURE WITHIN THE LAST 5 DAYS. Performed at [facility], [address] [place], [place] [address]    Report Status  12/08/2023 FINAL  Final  Blood Culture ID Panel (Reflexed)     Status: Abnormal   Collection Time: 12/04/23  6:34 PM  Result Value Ref Range Status   Enterococcus faecalis NOT DETECTED NOT DETECTED Final   Enterococcus Faecium NOT DETECTED NOT DETECTED Final   Listeria monocytogenes NOT DETECTED NOT DETECTED Final   Staphylococcus species DETECTED (A) NOT DETECTED Final    Comment: CRITICAL RESULT CALLED TO, READ BACK BY AND VERIFIED WITH: JASON ROBBINS 12/06/2023 AT 0648 SRR    Staphylococcus aureus (BCID) DETECTED (A) NOT DETECTED Final    Comment: Methicillin (oxacillin)-resistant Staphylococcus aureus (MRSA). MRSA is predictably resistant to beta-lactam antibiotics (except ceftaroline). Preferred therapy is vancomycin  unless clinically contraindicated. Patient requires contact precautions if  hospitalized. CRITICAL RESULT CALLED TO, READ BACK BY AND VERIFIED  WITH: JASON ROBBINS 12/06/2023 AT 0648 SRR    Staphylococcus epidermidis NOT DETECTED NOT DETECTED Final   Staphylococcus lugdunensis NOT DETECTED NOT DETECTED Final   Streptococcus species NOT DETECTED NOT DETECTED Final   Streptococcus agalactiae NOT DETECTED NOT DETECTED Final   Streptococcus pneumoniae NOT DETECTED NOT DETECTED Final   Streptococcus pyogenes NOT DETECTED NOT DETECTED Final   A.calcoaceticus-baumannii NOT DETECTED NOT DETECTED Final   Bacteroides fragilis NOT DETECTED NOT DETECTED Final   Enterobacterales NOT DETECTED NOT DETECTED Final   Enterobacter cloacae complex NOT DETECTED NOT DETECTED Final   Escherichia coli NOT DETECTED NOT DETECTED Final   Klebsiella aerogenes NOT DETECTED NOT DETECTED Final   Klebsiella oxytoca NOT DETECTED NOT DETECTED Final   Klebsiella pneumoniae NOT DETECTED NOT DETECTED Final   Proteus species NOT DETECTED NOT DETECTED Final   Salmonella species NOT DETECTED NOT DETECTED Final   Serratia marcescens NOT DETECTED NOT DETECTED Final   Haemophilus influenzae NOT DETECTED NOT  DETECTED Final   Neisseria meningitidis NOT DETECTED NOT DETECTED Final   Pseudomonas aeruginosa NOT DETECTED NOT DETECTED Final   Stenotrophomonas maltophilia NOT DETECTED NOT DETECTED Final   Candida albicans NOT DETECTED NOT DETECTED Final   Candida auris NOT DETECTED NOT DETECTED Final   Candida glabrata NOT DETECTED NOT DETECTED Final   Candida krusei NOT DETECTED NOT DETECTED Final   Candida parapsilosis NOT DETECTED NOT DETECTED Final   Candida tropicalis NOT DETECTED NOT DETECTED Final   Cryptococcus neoformans/gattii NOT DETECTED NOT DETECTED Final   Meth resistant mecA/C and MREJ DETECTED (A) NOT DETECTED Final    Comment: CRITICAL RESULT CALLED TO, READ BACK BY AND VERIFIED WITH: JASON ROBBINS 12/06/2023 AT 9351 SRR Performed at [facility], [address] [place], [place] [address]   MIC (1 Drug)-blood culture; 12/04/2023; BLOOD RIGHT ARM; MRSA; Daptomycin      Status: Abnormal   Collection Time: 12/04/23  6:34 PM   Specimen: BLOOD RIGHT ARM  Result Value Ref Range Status   Min Inhibitory Conc (1 Drug) Preliminary report (A)  Final    Comment: (NOTE) Performed At: [facility] [address] [place], [place] [id number] Jennette Shorter MD Ey:[phone]    Source BLOOD CULTURE  Final    Comment: Performed at [facility], [address] [place], [place] [address]  MIC Result     Status: Abnormal   Collection Time: 12/04/23  6:34 PM  Result Value Ref Range Status   Result 1 (MIC) Comment (A)  Final    Comment: (NOTE) Methicillin - resistant Staphylococcus aureus Identification performed by account, not confirmed by this laboratory. DAPTOMYCIN  Performed At: [facility] [address] [place], [place] [id number] Jennette Shorter MD Ey:[phone]   SARS Coronavirus 2 by RT PCR (hospital order, performed in [facility] hospital lab) *cepheid single result test* Anterior Nasal Swab     Status: None   Collection Time: 12/04/23  8:40 PM    Specimen: Anterior Nasal Swab  Result Value Ref Range Status   SARS Coronavirus 2 by RT PCR NEGATIVE NEGATIVE Final    Comment: (NOTE) SARS-CoV-2 target nucleic acids are NOT DETECTED.  The SARS-CoV-2 RNA is generally detectable in upper and lower respiratory specimens during the acute phase of infection. The lowest concentration of SARS-CoV-2 viral copies this assay can detect is 250 copies / mL. A negative result does not preclude SARS-CoV-2 infection and should not be used as the sole basis for treatment or other patient management decisions.  A  negative result may occur with improper specimen collection / handling, submission of specimen other than nasopharyngeal swab, presence of viral mutation(s) within the areas targeted by this assay, and inadequate number of viral copies (<250 copies / mL). A negative result must be combined with clinical observations, patient history, and epidemiological information.  Fact Sheet for Patients:   RoadLapTop.co.za  Fact Sheet for Healthcare Providers: http://kim-miller.com/  This test is not yet approved or  cleared by the [place]  FDA and has been authorized for detection and/or diagnosis of SARS-CoV-2 by FDA under an Emergency Use Authorization (EUA).  This EUA will remain in effect (meaning this test can be used) for the duration of the COVID-19 declaration under Section 564(b)(1) of the Act, 21 U.S.C. section 360bbb-3(b)(1), unless the authorization is terminated or revoked sooner.  Performed at [facility], [address] [place], [place] [address]   Urine Culture (for pregnant, neutropenic or urologic patients or patients with an indwelling urinary catheter)     Status: Abnormal   Collection Time: 12/04/23  8:45 PM   Specimen: Urine, Clean Catch  Result Value Ref Range Status   Specimen Description   Final    URINE, CLEAN CATCH Performed at [facility], [address], [place], [place] [address]    Special Requests   Final    NONE Performed at [facility], [address] [place], [place] [address]    Culture (A)  Final    <10,000 COLONIES/mL INSIGNIFICANT GROWTH Performed at [facility], [address] [place], [place] [address]    Report Status 12/06/2023 FINAL  Final  Respiratory (~20 pathogens) panel by PCR     Status: None   Collection Time: 12/04/23 11:30 PM   Specimen: Nasopharyngeal Swab; Respiratory  Result Value Ref Range Status   Adenovirus NOT DETECTED NOT DETECTED Final   Coronavirus 229E NOT DETECTED NOT DETECTED Final    Comment: (NOTE) The Coronavirus on the Respiratory Panel, DOES NOT test for the novel  Coronavirus (2019 nCoV)    Coronavirus HKU1 NOT DETECTED NOT DETECTED Final   Coronavirus NL63 NOT DETECTED NOT DETECTED Final   Coronavirus OC43 NOT DETECTED NOT DETECTED Final   Metapneumovirus NOT DETECTED NOT DETECTED Final   Rhinovirus / Enterovirus NOT DETECTED NOT DETECTED Final   Influenza A NOT DETECTED NOT DETECTED Final   Influenza B NOT DETECTED NOT DETECTED Final   Parainfluenza Virus 1 NOT DETECTED NOT DETECTED Final   Parainfluenza Virus 2 NOT DETECTED NOT DETECTED Final   Parainfluenza Virus 3 NOT DETECTED NOT DETECTED Final   Parainfluenza Virus 4 NOT DETECTED NOT DETECTED Final   Respiratory Syncytial Virus NOT DETECTED NOT DETECTED Final   Bordetella pertussis NOT DETECTED NOT DETECTED Final   Bordetella Parapertussis NOT DETECTED NOT DETECTED Final   Chlamydophila pneumoniae NOT DETECTED NOT DETECTED Final   Mycoplasma pneumoniae NOT DETECTED NOT DETECTED Final    Comment: Performed at [facility], [address], [place], [place] [address]  Cath Tip Culture     Status: None   Collection Time: 12/06/23  2:45 PM   Specimen: Catheter Tip; Other  Result Value Ref Range Status   Specimen Description   Final    CATH TIP Performed at [facility], [address] [place], [place] [address]    Special Requests   Final    NONE Performed at [facility], [address], [place], [place] [address]    Culture  Final    NO GROWTH 2 DAYS Performed at [facility], [address], [place], [place] [address]    Report Status 12/10/2023 FINAL  Final  MRSA Next Gen by PCR, Nasal     Status: Abnormal   Collection Time: 12/07/23  9:00 AM   Specimen: Nasal Mucosa; Nasal Swab  Result Value Ref Range Status   MRSA by PCR Next Gen DETECTED (A) NOT DETECTED Final    Comment: RESULT CALLED TO, READ BACK BY AND VERIFIED WITH: ELIZABETH HAYS 12/07/23 1041 SLM (NOTE) The GeneXpert MRSA Assay (FDA approved for NASAL specimens only), is one component of a comprehensive MRSA colonization surveillance program. It is not intended to diagnose MRSA infection nor to guide or monitor treatment for MRSA infections. Test performance is not FDA approved in patients less than 54 years old. Performed at [facility], [address] [place], [place] [address]   Culture, blood (Routine X 2) w Reflex to ID Panel     Status: None   Collection Time: 12/07/23 12:50 PM   Specimen: BLOOD  Result Value Ref Range Status   Specimen Description BLOOD RIGHT ANTECUBITAL  Final   Special Requests   Final    BOTTLES DRAWN AEROBIC AND ANAEROBIC Blood Culture adequate volume   Culture   Final    NO GROWTH 5 DAYS Performed at [facility], [address] [place], [place] [address]    Report Status 12/12/2023 FINAL  Final  Culture, blood (Routine X 2) w Reflex to ID Panel     Status: None   Collection Time: 12/07/23 12:50 PM   Specimen: BLOOD  Result Value Ref Range Status   Specimen Description BLOOD LEFT ANTECUBITAL  Final   Special Requests   Final    BOTTLES DRAWN AEROBIC AND ANAEROBIC Blood Culture adequate volume   Culture   Final    NO GROWTH 5 DAYS Performed at [facility], [address] [place], [place] [address]     Report Status 12/12/2023 FINAL  Final    Coagulation Studies: No results for input(s): LABPROT, INR in the last 72 hours.  Urinalysis: No results for input(s): COLORURINE, LABSPEC, PHURINE, GLUCOSEU, HGBUR, BILIRUBINUR, KETONESUR, PROTEINUR, UROBILINOGEN, NITRITE, LEUKOCYTESUR in the last 72 hours.  Invalid input(s): APPERANCEUR     Imaging: No results found.     Medications:    anticoagulant sodium citrate      vancomycin       sodium chloride    Intravenous Once   sodium chloride    Intravenous Once   amiodarone   400 mg Oral BID   Followed by   NOREEN ON 12/21/2023] amiodarone   200 mg Oral Daily   apixaban   2.5 mg Oral BID   aspirin  EC  81 mg Oral QHS   Chlorhexidine  Gluconate Cloth  6 each Topical Q0600   cloNIDine   0.2 mg Oral BID   diltiazem   120 mg Oral Daily   epoetin  alfa-epbx (RETACRIT ) injection  4,000 Units Intravenous Q T,Th,Sat-1800   insulin  aspart  0-5 Units Subcutaneous QHS   insulin  aspart  0-9 Units Subcutaneous TID WC   insulin  aspart  3 Units Subcutaneous TID WC   insulin  glargine-yfgn  10 Units Subcutaneous QHS   ipratropium-albuterol   3 mL Nebulization TID   lactose free nutrition  237 mL Oral TID WC   lidocaine   10 mL Intradermal Once   losartan   100 mg Oral QHS   metoprolol  succinate  100 mg Oral QHS   multivitamin  1 tablet Oral QHS   senna-docusate  2 tablet Oral BID   torsemide   40 mg Oral Daily   vancomycin  variable dose per unstable renal function (pharmacist dosing)   Does not apply See admin instructions   acetaminophen  **OR** acetaminophen , albuterol , anticoagulant sodium citrate , guaiFENesin -dextromethorphan , hydrALAZINE , ondansetron  **OR** ondansetron  (ZOFRAN ) IV, oxyCODONE -acetaminophen , sucralfate , zolpidem   Assessment/ Plan:  Mckenzie Lawrence is a 78 y.o.  female  with past medical history of hypertension, long-standing diabetes mellitus type 2, lower extremity edema, hyperlipidemia, tobacco abuse, prior  history of CVA, COPD, obstructive sleep apnea, lumbar spinal fusion, left upper lobe lung mass who presents with volume overload and increasing shortness of breath.   1.  Acute kidney injury/chronic kidney disease stage IV/diabetes mellitus type 2 with chronic kidney disease/proteinuria/MRSA bacteremia.  eGFR down to 11.  Baseline creatinine 2.7 in February 2025.  Creatinine increased to 3.6-4 in June 2025. - PermCath originally placed  (12/01/2023) and removed 12/06/2023.  Negative TEE 12/08/2023. - Case discussed with infectious diseases on 12/09/2023.  Receiving IV Vancomycin  1g/1g/1g until 7/20. ID considering transitioning to Daptomycin  due to preserved renal function, awaiting MIC.  - Appreciate vascular surgery placing permcath on 6/25 -Outpatient dialysis seat at [facility] [address] has [facility], TTS. Patient to arrive at 11:30 AM. Able to start Tuesday.  -Receiving dialysis today, seated in chair.  - Next treatment scheduled for Tuesday.   Lab Results  Component Value Date   CREATININE 3.06 (H) 12/13/2023   CREATININE 2.06 (H) 12/12/2023   CREATININE 2.71 (H) 12/11/2023     Intake/Output Summary (Last 24 hours) at 12/13/2023 1020 Last data filed at 12/12/2023 1900 Gross per 24 hour  Intake 0 ml  Output --  Net 0 ml       2.  Hyponatremia.  Serum sodium decreased but stable  3.  Hyperkalemia Resolved   4.  HTN with chronic kidney disease - 2D echo from 11/29/2023 shows LVEF 55 to 60%, grade 2 diastolic dysfunction, trivial mitral regurgitation Continue clonidine , losartan , and metoprolol . Blood pressure 143/70 during dialysis   5.  Anemia of chronic kidney disease.  Patient has  left upper lobe mass concerning for malignancy.  Discussed with Dr. Jacobo. Hgb 7.3, primary team has ordered blood transfusion with dialysis.    LOS: 15 Ailanie Ruttan 6/28/202510:20 AM

## 2023-12-13 NOTE — Progress Notes (Signed)
 Hemodialysis Note:  Received patient in chair to unit. Alert and oriented. Informed consent singed and in chart.  Treatment initiated: 0857 Treatment completed: 1226  Access used: Left internal jugular catheter Access issues: None  1 unit of blood was transfused during treatment, no transfusion recation noted. Patient tolerated well. Transported back to room, alert without acute distress. Report given to patient's RN.  Total UF removed: 900 ml Medications given: Retracrit 4000 units IV, Vancomycin  1250 mg IV  Post HD weight: 78.9 kg  [facility]

## 2023-12-13 NOTE — Progress Notes (Signed)
 Pharmacy Antibiotic Note  Mckenzie Lawrence is a 78 y.o. female admitted on 11/28/2023 with hypertensive urgency/emergency, possible new onset CHF, and acute renal failure on CKD. Patient has PMH of DM, lumbar spinal fusion, HLD, and CKD. Patient had PermCath placed on 6/16 and was started on HD. Blood cultures from 6/19 showing MRSA Bactermia. Permath removed 6/21.  Pharmacy has been consulted for Vancomycin  dosing.  Today, 12/13/2023 Day #8 vancomycin  Renal: AKI on CKD with new start of HD this admission with development of likely line infection with MRSA Patient with residual renal function (still making urine) WBC 13.8 > 11.8 afebrile 6/21 Cath tip culture: NGTD 6/22 repeat Bcx: NGTD 6/23 TEE: no evidence of vegetation 6/25 tunneled Left internal jugular HD catheter placed. HD resumed 6/25 **NOTE** order placed 6/25 for vancomycin  1gm IV  x1 with HD at 2pm (patient was in HD until 4pm).  This vancomycin  dose was not given during HD and daughter refused dose be given via PIV on floor (d/t concern of possible extravasation earlier this admission)  Vancomycin  levels: Vancomycin  1500mg  IV x 1 given 6/21 at 0929 6/22 Random vancomycin  level at 0657 = 13 mcg/ml 6/23 Random vancomycin  level at 0346 = 9 mcg/ml Vancomycin  1500mg  IV x1 given 6/23 at 1610 6/24 random vancomycin  level at 0509 = 27 mcg/ml 6/25 random vancomycin  level at 0326 = 21 mcg/ml.  Estimated Half-life based on 6/24 and 6/25 levels is ~62 hours  Plan: Plan for HD is TTS per Nephrology. Vancomycin  random is 20. Will give vancomycin  1250 mg x 1 in the afternoon.  Due to residual renal function, anticipate may need higher dose with HD.  Plan will be vancomycin  1gm IV qHD ID following Daptomycin  MIC ordered 6/24 - sent to [facility]  Height: 5' 7 (170.2 cm) Weight: 75 kg (165 lb 5.5 oz) IBW/kg (Calculated) : 61.6  Temp (24hrs), Avg:98.4 F (36.9 C), Min:98.1 F (36.7 C), Max:99 F (37.2 C)  Recent Labs  Lab 12/07/23 0657  12/07/23 1219 12/08/23 0346 12/09/23 0509 12/10/23 0326 12/10/23 1400 12/11/23 0336 12/11/23 0811 12/12/23 0050 12/12/23 0420 12/13/23 0610  WBC  --  13.2*  --  13.8* 13.8*  --  10.6*  --  12.3* 12.2* 11.8*  CREATININE  --  2.65*  --  3.78*  --  4.46*  --  2.71* 2.06*  --   --   VANCOTROUGH 12*  --   --   --   --   --   --   --   --   --   --   VANCORANDOM  --   --    < > 27 21  --   --   --   --   --  20   < > = values in this interval not displayed.    Estimated Creatinine Clearance: 24.6 mL/min (A) (by C-G formula based on SCr of 2.06 mg/dL (H)).    Allergies  Allergen Reactions   Iodine Anaphylaxis and Other (See Comments)    Pt states that she is allergic to ingested iodine only, okay for betadine.     Iodine I-131 Tositumomab Anaphylaxis   Shellfish Allergy Anaphylaxis   Codeine Nausea And Vomiting   Morphine  Sulfate Nausea And Vomiting   Irbesartan Other (See Comments)     Unknown  (Avapro)   Sulfa Antibiotics Other (See Comments)    Fever     Antimicrobials this admission:  6/21 Vancomycin   >>   Dose adjustments this admission:  Microbiology results: 6/19  BCx: MRSA   Thank you for allowing pharmacy to be a part of this patient's care.  Dustin Zeigler, PharmD, BCPS, BCIDP Work Cell: [phone] 12/13/2023 7:42 AM

## 2023-12-14 DIAGNOSIS — A4102 Sepsis due to Methicillin resistant Staphylococcus aureus: Secondary | ICD-10-CM | POA: Diagnosis not present

## 2023-12-14 DIAGNOSIS — I160 Hypertensive urgency: Secondary | ICD-10-CM | POA: Diagnosis not present

## 2023-12-14 DIAGNOSIS — N186 End stage renal disease: Secondary | ICD-10-CM | POA: Diagnosis not present

## 2023-12-14 LAB — TYPE AND SCREEN
ABO/RH(D): A POS
Antibody Screen: NEGATIVE
Unit division: 0
Unit division: 0

## 2023-12-14 LAB — BPAM RBC
Blood Product Expiration Date: 202507242359
Blood Product Expiration Date: 202507272359
ISSUE DATE / TIME: 202506251451
ISSUE DATE / TIME: 202506280943
Unit Type and Rh: 6200
Unit Type and Rh: 6200

## 2023-12-14 LAB — CBC
HCT: 27.4 % — ABNORMAL LOW (ref 36.0–46.0)
Hemoglobin: 8.8 g/dL — ABNORMAL LOW (ref 12.0–15.0)
MCH: 29.3 pg (ref 26.0–34.0)
MCHC: 32.1 g/dL (ref 30.0–36.0)
MCV: 91.3 fL (ref 80.0–100.0)
Platelets: 305 10*3/uL (ref 150–400)
RBC: 3 MIL/uL — ABNORMAL LOW (ref 3.87–5.11)
RDW: 14.7 % (ref 11.5–15.5)
WBC: 10.4 10*3/uL (ref 4.0–10.5)
nRBC: 0 % (ref 0.0–0.2)

## 2023-12-14 LAB — GLUCOSE, CAPILLARY
Glucose-Capillary: 153 mg/dL — ABNORMAL HIGH (ref 70–99)
Glucose-Capillary: 124 mg/dL — ABNORMAL HIGH (ref 70–99)
Glucose-Capillary: 231 mg/dL — ABNORMAL HIGH (ref 70–99)
Glucose-Capillary: 168 mg/dL — ABNORMAL HIGH (ref 70–99)

## 2023-12-14 LAB — MINIMUM INHIBITORY CONC. (1 DRUG)

## 2023-12-14 LAB — MIC RESULT

## 2023-12-14 MED ORDER — LOPERAMIDE HCL 2 MG PO CAPS: 2.0000 mg | ORAL_CAPSULE | ORAL | Status: DC | PRN

## 2023-12-14 NOTE — Progress Notes (Signed)
 Progress Note   Patient: Mckenzie Lawrence FMW:[id number] DOB: [birth year] DOA: 11/28/2023     16 DOS: the patient was seen and examined on 12/14/2023   Brief hospital course: Mckenzie Lawrence is a 78 y.o. female with medical history significant for COPD, CVA, stage 4 CKD, SIADH, esophageal dysphagia treated with Botox  (last in 05/2023), DM, HLD, and HTN, last hospitalized from 2/1 to 07/26/2023 with MRSA bacteremia/sepsis/multifocal pneumonia, now presenting by EMS with shortness of breath and chest pressure.   She has no orthopnea and sleeps on 1 pillow.  She has a chronic congested cough that has not changed and has no fever or chills  BP on arrival 202/74 and tachypneic to 25 saturating at 98% on room air. WBC 11,000 with hemoglobin of 9 which is her baseline. Troponin 19 and BNP 2450 Creatinine 3.59 up from baseline of 2.74 with bicarb 19.  Glucose 207, potassium 5.2, sodium 123 (137 four months ago) EKG with sinus rhythm at 79 Chest x-ray showing small right pleural effusion and vascular congestion Patient treated with DuoNeb and Solu-Medrol  and given a dose of Lasix  Patient was diagnosed with acute on chronic diastolic congestive heart failure, she also had progressed to end-stage renal disease.  She was treated with initially with IV Lasix , then hemodialysis was started on 6/17. Blood culture positive again for MRSA, started on vancomycin .     Principal Problem:   Hypertensive urgency/emergency Active Problems:   COPD with acute exacerbation (HCC)   Acute dyspnea   CAD (coronary artery disease)   Chest pain   Acute renal failure superimposed on stage 4 chronic kidney disease (HCC)   Hyponatremia   MRSA (methicillin resistant Staphylococcus aureus) septicemia (HCC)   Hypertensive emergency   Encounter for dialysis catheter care [facility])   Paroxysmal atrial fibrillation (HCC)   ESRD (end stage renal disease) (HCC)   Acute on chronic diastolic CHF (congestive heart failure)  (HCC)   Assessment and Plan: MRSA septicemia:  Patient had a fever of 102.4 on 6/19, at the same time, heart rate of 92, leukocytosis of 12.1. Blood culture was positive for MRSA. Hx mrsa bacteremia 2/24 2/2 infected spinal hardware, had recurrent bout of mrsa bacteremia earlier this year no source identified. Has dialysis catheter placed this hospitalization, removed by vascular on 6/21. PIV also exchanged Repeat blood cultures ordered 6/22, ngtd. TTE/TEE negative. ID following.  Planning for vancomycin  for the least 4 weeks, too early to perform another scan of the spine. Patient condition has improved, currently pending MIC for daptomycin .  Likely discharge to home SNF on Monday.   Paroxysmal a-fib:  new overnight 6/22, now back in sinus.  Patient started on Eliquis , started having nasal bleeding.  Decrease dose to 2.5 mg twice a day due to end-stage renal disease 6/26. Nasal bleeding has resolved. Still in sinus.   Hypertensive emergency:  Blood pressure well-controlled.   Acute on chronic diastolic CHF:  w/ elevated BNP, CXR showing vascular congestion, pitting LE edema & dyspnea. Echo shows EF 50-55%, grade II diastolic dysfunction, no regional wall motion abnormalities. Still has some leg edema, volume controlled by dialysis.   COPD exacerbation:  Treated with steroids, condition has stabilized.  However, patient still having significant cough with large amount of mucus.  DuoNeb scheduled. Short of breath improved.   Chest pain: w/ hx of CAD. Troponins are minimally elevated.  EKG shows no ischemic changes. Continue on losartan , metoprolol , statin, aspirin . No CP currently    ESRD progressing from [facility]DIV Hyperkalemia  secondary to end-stage renal disease. Hyponatremia. Anemia of chronic kidney disease. : tunneled HD cath placed. Started on scheduled HD 12/02/23.  Permacath placed on 6/25. Patient has significant anemia, hemoglobin dropped down to 6.3 today, received 1 unit  PRBC, repeat hemoglobin today 7.6.  Recent iron study in 07/2023 showed a normal iron and B12 level.  No evidence of active bleeding. Patient received another unit of PRBC 6/28.   Anxiety: severity unknown. Valium  discontinued 6/19    Esophageal dysphagia: s/p botox  via EGD in 08/2022. Aspiration precautions. Continue w/ supportive care   OSA: CPAP qhs   DM2: well controlled at baseline, HbA1c 6.1. sugars labile here, on semglee /ssi   Debility: PT rec is now snf, toc consulted.     Constipation. Patient received lactulose , had multiple loose stools.  Constipation resolved.          Subjective:  Patient complaining of multiple loose stools, no nausea vomiting.  No abdominal pain.  Physical Exam: Vitals:   12/14/23 0346 12/14/23 0500 12/14/23 0744 12/14/23 0756  BP: (!) 146/56  (!) 140/64   Pulse: 65  63   Resp: 20  16 16   Temp: 98.6 F (37 C)  97.8 F (36.6 C)   TempSrc:   Oral   SpO2: 100%  99%   Weight:  83 kg    Height:       General exam: Appears calm and comfortable  Respiratory system: Clear to auscultation. Respiratory effort normal. Cardiovascular system: S1 & S2 heard, RRR. No JVD, murmurs, rubs, gallops or clicks.  Gastrointestinal system: Abdomen is nondistended, soft and nontender. No organomegaly or masses felt. Normal bowel sounds heard. Central nervous system: Alert and oriented. No focal neurological deficits. Extremities: 2+ bilateral leg pitting edema. Skin: No rashes, lesions or ulcers Psychiatry: Judgement and insight appear normal. Mood & affect appropriate.    Data Reviewed:  Lab results reviewed.  Family Communication: Daughter updated at bedside.  Disposition: Status is: Inpatient Remains inpatient appropriate because: Severity of disease.  Unsafe discharge pending nursing placement     Time spent: 35 minutes  Author: Murvin Mana, MD 12/14/2023 11:38 AM  For on call review [email or web address].

## 2023-12-14 NOTE — Progress Notes (Signed)
 [facility]  ROUNDING NOTE   Subjective:  [name] is a 78 y.o.  female with past medical history of hypertension, long-standing diabetes mellitus type 2, lower extremity edema, hyperlipidemia, tobacco abuse, prior history of CVA, COPD, obstructive sleep apnea, lumbar spinal fusion, left upper lobe lung mass who presents with volume overload and increasing shortness of breath.   Update:  Patient seen sitting up in chair Alert No complaints to offer 1L [place]  Objective:  Vital signs in last 24 hours:  Temp:  [97.8 F (36.6 C)-98.9 F (37.2 C)] 97.8 F (36.6 C) (06/29 0744) Pulse Rate:  [63-103] 63 (06/29 0744) Resp:  [16-33] 16 (06/29 0756) BP: (140-154)/(51-68) 140/64 (06/29 0744) SpO2:  [90 %-100 %] 99 % (06/29 0744) Weight:  [78.9 kg-83 kg] 83 kg (06/29 0500)  Weight change: 4.8 kg Filed Weights   12/13/23 0837 12/13/23 1227 12/14/23 0500  Weight: 79.8 kg 78.9 kg 83 kg    Intake/Output: I/O last 3 completed shifts: In: 2048.4 [P.O.:1666; Blood:382.4] Out: 900 [Other:900]   Intake/Output this shift:  No intake/output data recorded.  Physical Exam: General: No acute distress  Head: Normocephalic, atraumatic. Dry mucosal membranes  Eyes: Anicteric  Neck: Supple  Lungs:  Normal breathing effort  Heart: Regular rate and rhythm  Abdomen:  Soft, nontender, BS present  Extremities: No peripheral edema.  Neurologic: Alert, moving all four extremities  Skin: No lesions  Dialysis Access: Right IJ PermCath placed on 6/25    Basic Metabolic Panel: Recent Labs  Lab 12/07/23 1219 12/09/23 0509 12/10/23 1400 12/11/23 0811 12/12/23 0050 12/13/23 0853  NA 132* 132* 127* 132* 134* 131*  K 3.5 3.8 3.6 3.7 3.6 3.6  CL 94* 95* 91* 96* 97* 94*  CO2 27 26 24 27 27 25   GLUCOSE 238* 218* 238* 222* 165* 143*  BUN 28* 41* 51* 28* 18 29*  CREATININE 2.65* 3.78* 4.46* 2.71* 2.06* 3.06*  CALCIUM  7.7* 8.1* 7.8* 8.0* 8.0* 8.4*  PHOS 2.8  --  5.4* 3.7 2.4*  3.8    Liver Function Tests: Recent Labs  Lab 12/07/23 1219 12/10/23 1400 12/11/23 0811 12/12/23 0050 12/13/23 0853  ALBUMIN 1.8* 1.7* 1.8* 1.8* 1.9*   No results for input(s): LIPASE, AMYLASE in the last 168 hours. No results for input(s): AMMONIA in the last 168 hours.  CBC: Recent Labs  Lab 12/11/23 0336 12/11/23 1155 12/12/23 0050 12/12/23 0420 12/13/23 0610 12/14/23 0508  WBC 10.6*  --  12.3* 12.2* 11.8* 10.4  HGB 7.1* 7.6* 7.7* 7.6* 7.3* 8.8*  HCT 22.2*  --  23.7* 23.9* 23.0* 27.4*  MCV 92.5  --  91.5 91.9 92.7 91.3  PLT 218  --  239 264 279 305    Cardiac Enzymes: Recent Labs  Lab 12/12/23 0050  CKTOTAL 21*    BNP: Invalid input(s): POCBNP  CBG: Recent Labs  Lab 12/13/23 0809 12/13/23 1316 12/13/23 1606 12/13/23 2147 12/14/23 0745  GLUCAP 131* 91 212* 192* 153*    Microbiology: Results for orders placed or performed during the hospital encounter of 11/28/23  Culture, blood (Routine X 2) w Reflex to ID Panel     Status: Abnormal (Preliminary result)   Collection Time: 12/04/23  6:34 PM   Specimen: BLOOD  Result Value Ref Range Status   Specimen Description   Final    BLOOD BLOOD RIGHT ARM Performed at [facility], [address] [place], [place] [address]    Special Requests   Final  BOTTLES DRAWN AEROBIC AND ANAEROBIC Blood Culture adequate volume Performed at [facility], [address] [place], [place] [address]    Culture  Setup Time   Final    Organism ID to follow IN BOTH AEROBIC AND ANAEROBIC BOTTLES GRAM POSITIVE COCCI CRITICAL RESULT CALLED TO, READ BACK BY AND VERIFIED WITH: SELINDA SIMPERS 12/06/2023 AT 9351 SRR Performed at [facility], [address] [place], [place] [address]    Culture (A)  Final    METHICILLIN RESISTANT STAPHYLOCOCCUS AUREUS Sent to [facility] for further susceptibility testing. Performed at [facility], [address], [place], [place] [address]     Report Status PENDING  Incomplete   Organism ID, Bacteria METHICILLIN RESISTANT STAPHYLOCOCCUS AUREUS  Final      Susceptibility   Methicillin resistant staphylococcus aureus - MIC*    CIPROFLOXACIN  >=8 RESISTANT Resistant     ERYTHROMYCIN  >=8 RESISTANT Resistant     GENTAMICIN  <=0.5 SENSITIVE Sensitive     OXACILLIN >=4 RESISTANT Resistant     TETRACYCLINE <=1 SENSITIVE Sensitive     VANCOMYCIN  2 SENSITIVE Sensitive     TRIMETH/SULFA <=10 SENSITIVE Sensitive     CLINDAMYCIN <=0.25 SENSITIVE Sensitive     RIFAMPIN <=0.5 SENSITIVE Sensitive     Inducible Clindamycin NEGATIVE Sensitive     LINEZOLID  2 SENSITIVE Sensitive     * METHICILLIN RESISTANT STAPHYLOCOCCUS AUREUS  Culture, blood (Routine X 2) w Reflex to ID Panel     Status: Abnormal   Collection Time: 12/04/23  6:34 PM   Specimen: BLOOD  Result Value Ref Range Status   Specimen Description   Final    BLOOD BLOOD LEFT ARM Performed at [facility], [address] [place], [place] [address]    Special Requests   Final    BOTTLES DRAWN AEROBIC AND ANAEROBIC Blood Culture adequate volume Performed at [facility], [address] [place], [place] [address]    Culture  Setup Time   Final    GRAM POSITIVE COCCI IN BOTH AEROBIC AND ANAEROBIC BOTTLES CRITICAL VALUE NOTED.  VALUE IS CONSISTENT WITH PREVIOUSLY REPORTED AND CALLED VALUE. Performed at [facility], [address] [place], [place] [address]    Culture (A)  Final    STAPHYLOCOCCUS AUREUS SUSCEPTIBILITIES PERFORMED ON PREVIOUS CULTURE WITHIN THE LAST 5 DAYS. Performed at [facility], [address], [place], [place] [address]    Report Status 12/08/2023 FINAL  Final  Blood Culture ID Panel (Reflexed)     Status: Abnormal   Collection Time: 12/04/23  6:34 PM  Result Value Ref Range Status   Enterococcus faecalis NOT DETECTED NOT DETECTED Final   Enterococcus Faecium NOT DETECTED NOT DETECTED Final   Listeria monocytogenes NOT  DETECTED NOT DETECTED Final   Staphylococcus species DETECTED (A) NOT DETECTED Final    Comment: CRITICAL RESULT CALLED TO, READ BACK BY AND VERIFIED WITH: JASON ROBBINS 12/06/2023 AT 0648 SRR    Staphylococcus aureus (BCID) DETECTED (A) NOT DETECTED Final    Comment: Methicillin (oxacillin)-resistant Staphylococcus aureus (MRSA). MRSA is predictably resistant to beta-lactam antibiotics (except ceftaroline). Preferred therapy is vancomycin  unless clinically contraindicated. Patient requires contact precautions if  hospitalized. CRITICAL RESULT CALLED TO, READ BACK BY AND VERIFIED WITH: JASON ROBBINS 12/06/2023 AT 0648 SRR    Staphylococcus epidermidis NOT DETECTED NOT DETECTED Final   Staphylococcus lugdunensis NOT DETECTED NOT DETECTED Final   Streptococcus species NOT DETECTED NOT DETECTED Final   Streptococcus agalactiae NOT DETECTED NOT  DETECTED Final   Streptococcus pneumoniae NOT DETECTED NOT DETECTED Final   Streptococcus pyogenes NOT DETECTED NOT DETECTED Final   A.calcoaceticus-baumannii NOT DETECTED NOT DETECTED Final   Bacteroides fragilis NOT DETECTED NOT DETECTED Final   Enterobacterales NOT DETECTED NOT DETECTED Final   Enterobacter cloacae complex NOT DETECTED NOT DETECTED Final   Escherichia coli NOT DETECTED NOT DETECTED Final   Klebsiella aerogenes NOT DETECTED NOT DETECTED Final   Klebsiella oxytoca NOT DETECTED NOT DETECTED Final   Klebsiella pneumoniae NOT DETECTED NOT DETECTED Final   Proteus species NOT DETECTED NOT DETECTED Final   Salmonella species NOT DETECTED NOT DETECTED Final   Serratia marcescens NOT DETECTED NOT DETECTED Final   Haemophilus influenzae NOT DETECTED NOT DETECTED Final   Neisseria meningitidis NOT DETECTED NOT DETECTED Final   Pseudomonas aeruginosa NOT DETECTED NOT DETECTED Final   Stenotrophomonas maltophilia NOT DETECTED NOT DETECTED Final   Candida albicans NOT DETECTED NOT DETECTED Final   Candida auris NOT DETECTED NOT DETECTED  Final   Candida glabrata NOT DETECTED NOT DETECTED Final   Candida krusei NOT DETECTED NOT DETECTED Final   Candida parapsilosis NOT DETECTED NOT DETECTED Final   Candida tropicalis NOT DETECTED NOT DETECTED Final   Cryptococcus neoformans/gattii NOT DETECTED NOT DETECTED Final   Meth resistant mecA/C and MREJ DETECTED (A) NOT DETECTED Final    Comment: CRITICAL RESULT CALLED TO, READ BACK BY AND VERIFIED WITH: JASON ROBBINS 12/06/2023 AT 9351 SRR Performed at [facility], [address] [place], [place] [address]   MIC (1 Drug)-blood culture; 12/04/2023; BLOOD RIGHT ARM; MRSA; Daptomycin      Status: Abnormal   Collection Time: 12/04/23  6:34 PM   Specimen: BLOOD RIGHT ARM  Result Value Ref Range Status   Min Inhibitory Conc (1 Drug) Preliminary report (A)  Final    Comment: (NOTE) Performed At: [facility] [employer] [address] [place], [place] [id number] Jennette Shorter MD Ey:[phone]    Source BLOOD CULTURE  Final    Comment: Performed at [facility], [address], [place], [place] [address]  MIC Result     Status: Abnormal   Collection Time: 12/04/23  6:34 PM  Result Value Ref Range Status   Result 1 (MIC) Comment (A)  Final    Comment: (NOTE) Methicillin - resistant Staphylococcus aureus Identification performed by account, not confirmed by this laboratory. DAPTOMYCIN  Performed At: [facility] [address] [place], [place] [id number] Jennette Shorter MD Ey:[phone]   SARS Coronavirus 2 by RT PCR (hospital order, performed in [facility] hospital lab) *cepheid single result test* Anterior Nasal Swab     Status: None   Collection Time: 12/04/23  8:40 PM   Specimen: Anterior Nasal Swab  Result Value Ref Range Status   SARS Coronavirus 2 by RT PCR NEGATIVE NEGATIVE Final    Comment: (NOTE) SARS-CoV-2 target nucleic acids are NOT DETECTED.  The SARS-CoV-2 RNA is generally detectable in upper and lower respiratory specimens during the  acute phase of infection. The lowest concentration of SARS-CoV-2 viral copies this assay can detect is 250 copies / mL. A negative result does not preclude SARS-CoV-2 infection and should not be used as the sole basis for treatment or other patient management decisions.  A negative result may occur with improper specimen collection / handling, submission of specimen other than nasopharyngeal swab, presence of viral mutation(s) within the areas targeted by this assay, and inadequate number of viral copies (<250 copies / mL). A negative result must  be combined with clinical observations, patient history, and epidemiological information.  Fact Sheet for Patients:   RoadLapTop.co.za  Fact Sheet for Healthcare Providers: http://kim-miller.com/  This test is not yet approved or  cleared by the [place]  FDA and has been authorized for detection and/or diagnosis of SARS-CoV-2 by FDA under an Emergency Use Authorization (EUA).  This EUA will remain in effect (meaning this test can be used) for the duration of the COVID-19 declaration under Section 564(b)(1) of the Act, 21 U.S.C. section 360bbb-3(b)(1), unless the authorization is terminated or revoked sooner.  Performed at [facility], [address], [place], [place] [address]   Urine Culture (for pregnant, neutropenic or urologic patients or patients with an indwelling urinary catheter)     Status: Abnormal   Collection Time: 12/04/23  8:45 PM   Specimen: Urine, Clean Catch  Result Value Ref Range Status   Specimen Description   Final    URINE, CLEAN CATCH Performed at [facility], [address], [place], [place] [address]    Special Requests   Final    NONE Performed at [facility], [address] [place], [place] [address]    Culture (A)  Final    <10,000 COLONIES/mL INSIGNIFICANT GROWTH Performed at [facility], [address]  [place], [place] [address]    Report Status 12/06/2023 FINAL  Final  Respiratory (~20 pathogens) panel by PCR     Status: None   Collection Time: 12/04/23 11:30 PM   Specimen: Nasopharyngeal Swab; Respiratory  Result Value Ref Range Status   Adenovirus NOT DETECTED NOT DETECTED Final   Coronavirus 229E NOT DETECTED NOT DETECTED Final    Comment: (NOTE) The Coronavirus on the Respiratory Panel, DOES NOT test for the novel  Coronavirus (2019 nCoV)    Coronavirus HKU1 NOT DETECTED NOT DETECTED Final   Coronavirus NL63 NOT DETECTED NOT DETECTED Final   Coronavirus OC43 NOT DETECTED NOT DETECTED Final   Metapneumovirus NOT DETECTED NOT DETECTED Final   Rhinovirus / Enterovirus NOT DETECTED NOT DETECTED Final   Influenza A NOT DETECTED NOT DETECTED Final   Influenza B NOT DETECTED NOT DETECTED Final   Parainfluenza Virus 1 NOT DETECTED NOT DETECTED Final   Parainfluenza Virus 2 NOT DETECTED NOT DETECTED Final   Parainfluenza Virus 3 NOT DETECTED NOT DETECTED Final   Parainfluenza Virus 4 NOT DETECTED NOT DETECTED Final   Respiratory Syncytial Virus NOT DETECTED NOT DETECTED Final   Bordetella pertussis NOT DETECTED NOT DETECTED Final   Bordetella Parapertussis NOT DETECTED NOT DETECTED Final   Chlamydophila pneumoniae NOT DETECTED NOT DETECTED Final   Mycoplasma pneumoniae NOT DETECTED NOT DETECTED Final    Comment: Performed at [facility], [address] [place], [place] [address]  Cath Tip Culture     Status: None   Collection Time: 12/06/23  2:45 PM   Specimen: Catheter Tip; Other  Result Value Ref Range Status   Specimen Description   Final    CATH TIP Performed at [facility], [address], [place], [place] [address]    Special Requests   Final    NONE Performed at [facility], [address], [place], [place] [address]    Culture   Final    NO GROWTH 2 DAYS Performed at [facility], [address] [place], [place] [address]    Report  Status 12/10/2023 FINAL  Final  MRSA Next Gen by PCR, Nasal  Status: Abnormal   Collection Time: 12/07/23  9:00 AM   Specimen: Nasal Mucosa; Nasal Swab  Result Value Ref Range Status   MRSA by PCR Next Gen DETECTED (A) NOT DETECTED Final    Comment: RESULT CALLED TO, READ BACK BY AND VERIFIED WITH: ELIZABETH HAYS 12/07/23 1041 SLM (NOTE) The GeneXpert MRSA Assay (FDA approved for NASAL specimens only), is one component of a comprehensive MRSA colonization surveillance program. It is not intended to diagnose MRSA infection nor to guide or monitor treatment for MRSA infections. Test performance is not FDA approved in patients less than 65 years old. Performed at [facility], [address] [place], [place] [address]   Culture, blood (Routine X 2) w Reflex to ID Panel     Status: None   Collection Time: 12/07/23 12:50 PM   Specimen: BLOOD  Result Value Ref Range Status   Specimen Description BLOOD RIGHT ANTECUBITAL  Final   Special Requests   Final    BOTTLES DRAWN AEROBIC AND ANAEROBIC Blood Culture adequate volume   Culture   Final    NO GROWTH 5 DAYS Performed at [facility], [address], [place], [place] [address]    Report Status 12/12/2023 FINAL  Final  Culture, blood (Routine X 2) w Reflex to ID Panel     Status: None   Collection Time: 12/07/23 12:50 PM   Specimen: BLOOD  Result Value Ref Range Status   Specimen Description BLOOD LEFT ANTECUBITAL  Final   Special Requests   Final    BOTTLES DRAWN AEROBIC AND ANAEROBIC Blood Culture adequate volume   Culture   Final    NO GROWTH 5 DAYS Performed at [facility], [address] [place], [place] [address]    Report Status 12/12/2023 FINAL  Final    Coagulation Studies: No results for input(s): LABPROT, INR in the last 72 hours.  Urinalysis: No results for input(s): COLORURINE, LABSPEC, PHURINE, GLUCOSEU, HGBUR, BILIRUBINUR, KETONESUR, PROTEINUR,  UROBILINOGEN, NITRITE, LEUKOCYTESUR in the last 72 hours.  Invalid input(s): APPERANCEUR     Imaging: No results found.     Medications:    anticoagulant sodium citrate       sodium chloride    Intravenous Once   sodium chloride    Intravenous Once   amiodarone   400 mg Oral BID   Followed by   NOREEN ON 12/21/2023] amiodarone   200 mg Oral Daily   apixaban   2.5 mg Oral BID   aspirin  EC  81 mg Oral QHS   Chlorhexidine  Gluconate Cloth  6 each Topical Q0600   cloNIDine   0.2 mg Oral BID   diltiazem   120 mg Oral Daily   epoetin  alfa-epbx (RETACRIT ) injection  4,000 Units Intravenous Q T,Th,Sat-1800   insulin  aspart  0-5 Units Subcutaneous QHS   insulin  aspart  0-9 Units Subcutaneous TID WC   insulin  aspart  3 Units Subcutaneous TID WC   insulin  glargine-yfgn  10 Units Subcutaneous QHS   ipratropium-albuterol   3 mL Nebulization TID   lactose free nutrition  237 mL Oral TID WC   lidocaine   10 mL Intradermal Once   losartan   100 mg Oral QHS   metoprolol  succinate  100 mg Oral QHS   multivitamin  1 tablet Oral QHS   senna-docusate  2 tablet Oral BID   torsemide   40 mg Oral Daily   vancomycin  variable dose per unstable renal function (pharmacist dosing)   Does not apply See admin instructions   acetaminophen  **OR** acetaminophen , albuterol , anticoagulant  sodium citrate , guaiFENesin -dextromethorphan , hydrALAZINE , loperamide, magic mouthwash, ondansetron  **OR** ondansetron  (ZOFRAN ) IV, oxyCODONE -acetaminophen , zolpidem   Assessment/ Plan:  [name] is a 78 y.o.  female  with past medical history of hypertension, long-standing diabetes mellitus type 2, lower extremity edema, hyperlipidemia, tobacco abuse, prior history of CVA, COPD, obstructive sleep apnea, lumbar spinal fusion, left upper lobe lung mass who presents with volume overload and increasing shortness of breath.   1.  Acute kidney injury/chronic kidney disease stage IV/diabetes mellitus type 2 with chronic  kidney disease/proteinuria/MRSA bacteremia.  eGFR down to 11.  Baseline creatinine 2.7 in February 2025.  Creatinine increased to 3.6-4 in June 2025. - PermCath originally placed  (12/01/2023) and removed 12/06/2023.  Negative TEE 12/08/2023. - Case discussed with infectious diseases on 12/09/2023.  Receiving IV Vancomycin  1g/1g/1g until 7/20. ID considering transitioning to Daptomycin  due to preserved renal function, awaiting MIC.  - Appreciate vascular surgery placing permcath on 6/25 -Outpatient dialysis seat at [facility] [address] has [facility], TTS. Patient to arrive at 11:30 AM. Able to start Tuesday.  dialysis received yesterday, UF 900 ml removed. - Next treatment scheduled for Tuesday.   Lab Results  Component Value Date   CREATININE 3.06 (H) 12/13/2023   CREATININE 2.06 (H) 12/12/2023   CREATININE 2.71 (H) 12/11/2023     Intake/Output Summary (Last 24 hours) at 12/14/2023 1131 Last data filed at 12/14/2023 0500 Gross per 24 hour  Intake 1426 ml  Output 900 ml  Net 526 ml       2.  Hyponatremia.  Serum sodium decreased but stable  3.  Hyperkalemia Resolved, well managed with dialysis   4.  HTN with chronic kidney disease - 2D echo from 11/29/2023 shows LVEF 55 to 60%, grade 2 diastolic dysfunction, trivial mitral regurgitation Continue clonidine , losartan , and metoprolol . Blood pressure 140/64   5.  Anemia of chronic kidney disease.  Patient has  left upper lobe mass concerning for malignancy.  Discussed with Dr. Jacobo. Hgb 8.8, improved with an additional unit of blood yesterday.   LOS: [address] 6/29/202511:31 AM

## 2023-12-14 NOTE — TOC Progression Note (Addendum)
 Transition of Care [facility]) - Progression Note    Patient Details  Name: Mckenzie Lawrence MRN: [id number] Date of Birth: [birth year]  Transition [facility]) CM/SW Contact  Lauraine JAYSON Carpen, LCSW Phone Number: 12/14/2023, 11:46 AM  Clinical Narrative:  Tried calling [employer] phone number for fast appeal for ambulance transport denial [phone]) but office is closed. They will reopen tomorrow at 8:00 am.   3:09 pm: CSW called [employer]. The estimated private pay cost for transport from [facility] to [facility] is $646.11. This includes the flat rate of $526.98 and the mileage cost of $119.13. Left voicemails for [employer] and [employer]. CSW sent this information to daughter in a text message.  Expected Discharge Plan and Services                                               Social Determinants of Health (SDOH) Interventions SDOH Screenings   Food Insecurity: No Food Insecurity (11/28/2023)  Housing: Low Risk  (11/28/2023)  Transportation Needs: No Transportation Needs (11/28/2023)  Utilities: Not At Risk (11/28/2023)  Depression (PHQ2-9): Low Risk  (08/05/2023)  Financial Resource Strain: Low Risk  (10/31/2023)   Received from [facility]  Social Connections: Unknown (11/28/2023)  Tobacco Use: High Risk (12/08/2023)    Readmission Risk Interventions    07/22/2023   10:56 AM 08/13/2022    1:04 PM 11/10/2021    2:49 PM  Readmission Risk Prevention Plan  Transportation Screening Complete Complete Complete  PCP or Specialist Appt within 5-7 Days  Complete   PCP or Specialist Appt within 3-5 Days Complete  Complete  Home Care Screening  Complete   Medication Review (RN CM)  Complete   HRI or Home Care Consult Complete  Complete  Social Work Consult for Recovery Care Planning/Counseling Complete  Complete  Palliative Care Screening Not Applicable  Not Applicable  Medication Review Furniture conservator/restorer) Complete  Complete

## 2023-12-14 NOTE — Plan of Care (Signed)
   Problem: Education: Goal: Ability to describe self-care measures that may prevent or decrease complications (Diabetes Survival Skills Education) will improve Outcome: Progressing   Problem: Coping: Goal: Ability to adjust to condition or change in health will improve Outcome: Progressing   Problem: Fluid Volume: Goal: Ability to maintain a balanced intake and output will improve Outcome: Progressing   Problem: Metabolic: Goal: Ability to maintain appropriate glucose levels will improve Outcome: Progressing   Problem: Tissue Perfusion: Goal: Adequacy of tissue perfusion will improve Outcome: Progressing

## 2023-12-15 DIAGNOSIS — N2581 Secondary hyperparathyroidism of renal origin: Secondary | ICD-10-CM | POA: Diagnosis not present

## 2023-12-15 DIAGNOSIS — E569 Vitamin deficiency, unspecified: Secondary | ICD-10-CM | POA: Diagnosis not present

## 2023-12-15 DIAGNOSIS — A4902 Methicillin resistant Staphylococcus aureus infection, unspecified site: Secondary | ICD-10-CM | POA: Diagnosis not present

## 2023-12-15 DIAGNOSIS — M6259 Muscle wasting and atrophy, not elsewhere classified, multiple sites: Secondary | ICD-10-CM | POA: Diagnosis not present

## 2023-12-15 DIAGNOSIS — G459 Transient cerebral ischemic attack, unspecified: Secondary | ICD-10-CM | POA: Diagnosis not present

## 2023-12-15 DIAGNOSIS — I480 Paroxysmal atrial fibrillation: Secondary | ICD-10-CM | POA: Diagnosis not present

## 2023-12-15 DIAGNOSIS — M62838 Other muscle spasm: Secondary | ICD-10-CM | POA: Diagnosis not present

## 2023-12-15 DIAGNOSIS — I161 Hypertensive emergency: Secondary | ICD-10-CM | POA: Diagnosis not present

## 2023-12-15 DIAGNOSIS — I959 Hypotension, unspecified: Secondary | ICD-10-CM | POA: Diagnosis not present

## 2023-12-15 DIAGNOSIS — J449 Chronic obstructive pulmonary disease, unspecified: Secondary | ICD-10-CM | POA: Diagnosis not present

## 2023-12-15 DIAGNOSIS — E1122 Type 2 diabetes mellitus with diabetic chronic kidney disease: Secondary | ICD-10-CM | POA: Diagnosis not present

## 2023-12-15 DIAGNOSIS — Z992 Dependence on renal dialysis: Secondary | ICD-10-CM | POA: Diagnosis not present

## 2023-12-15 DIAGNOSIS — G4700 Insomnia, unspecified: Secondary | ICD-10-CM | POA: Diagnosis not present

## 2023-12-15 DIAGNOSIS — L299 Pruritus, unspecified: Secondary | ICD-10-CM | POA: Diagnosis not present

## 2023-12-15 DIAGNOSIS — Z741 Need for assistance with personal care: Secondary | ICD-10-CM | POA: Diagnosis not present

## 2023-12-15 DIAGNOSIS — I10 Essential (primary) hypertension: Secondary | ICD-10-CM | POA: Diagnosis not present

## 2023-12-15 DIAGNOSIS — G4733 Obstructive sleep apnea (adult) (pediatric): Secondary | ICD-10-CM | POA: Diagnosis not present

## 2023-12-15 DIAGNOSIS — D72829 Elevated white blood cell count, unspecified: Secondary | ICD-10-CM | POA: Diagnosis not present

## 2023-12-15 DIAGNOSIS — A499 Bacterial infection, unspecified: Secondary | ICD-10-CM | POA: Diagnosis not present

## 2023-12-15 DIAGNOSIS — N179 Acute kidney failure, unspecified: Secondary | ICD-10-CM | POA: Diagnosis not present

## 2023-12-15 DIAGNOSIS — N186 End stage renal disease: Secondary | ICD-10-CM | POA: Diagnosis not present

## 2023-12-15 DIAGNOSIS — J441 Chronic obstructive pulmonary disease with (acute) exacerbation: Secondary | ICD-10-CM | POA: Diagnosis not present

## 2023-12-15 DIAGNOSIS — R0989 Other specified symptoms and signs involving the circulatory and respiratory systems: Secondary | ICD-10-CM | POA: Diagnosis not present

## 2023-12-15 DIAGNOSIS — I2510 Atherosclerotic heart disease of native coronary artery without angina pectoris: Secondary | ICD-10-CM | POA: Diagnosis not present

## 2023-12-15 DIAGNOSIS — I5032 Chronic diastolic (congestive) heart failure: Secondary | ICD-10-CM | POA: Diagnosis not present

## 2023-12-15 DIAGNOSIS — J90 Pleural effusion, not elsewhere classified: Secondary | ICD-10-CM | POA: Diagnosis not present

## 2023-12-15 DIAGNOSIS — I5033 Acute on chronic diastolic (congestive) heart failure: Secondary | ICD-10-CM | POA: Diagnosis not present

## 2023-12-15 DIAGNOSIS — Z743 Need for continuous supervision: Secondary | ICD-10-CM | POA: Diagnosis not present

## 2023-12-15 LAB — GLUCOSE, CAPILLARY
Glucose-Capillary: 120 mg/dL — ABNORMAL HIGH (ref 70–99)
Glucose-Capillary: 161 mg/dL — ABNORMAL HIGH (ref 70–99)

## 2023-12-15 MED ORDER — DILTIAZEM HCL ER COATED BEADS 120 MG PO CP24: 120.0000 mg | ORAL_CAPSULE | Freq: Every day | ORAL | Status: DC

## 2023-12-15 MED ORDER — DAPTOMYCIN IV (FOR PTA / DISCHARGE USE ONLY)
600.0000 mg | INTRAVENOUS | Status: AC
Start: 2023-12-16 — End: 2024-01-03

## 2023-12-15 MED ORDER — TORSEMIDE 40 MG PO TABS: 40.0000 mg | ORAL_TABLET | Freq: Every day | ORAL | Status: DC

## 2023-12-15 MED ORDER — OXYCODONE-ACETAMINOPHEN 5-325 MG PO TABS: 1.0000 | ORAL_TABLET | Freq: Three times a day (TID) | ORAL | 0 refills | Status: DC | PRN

## 2023-12-15 MED ORDER — MAGIC MOUTHWASH: 5.0000 mL | Freq: Four times a day (QID) | ORAL | Status: DC | PRN

## 2023-12-15 MED ORDER — ZOLPIDEM TARTRATE 5 MG PO TABS: 2.5000 mg | ORAL_TABLET | Freq: Every evening | ORAL | 0 refills | Status: DC | PRN

## 2023-12-15 MED ORDER — LIDOCAINE HCL 1 % IJ SOLN: 10.0000 mL | Freq: Once | INTRAMUSCULAR | Status: AC

## 2023-12-15 MED ORDER — IPRATROPIUM-ALBUTEROL 0.5-2.5 (3) MG/3ML IN SOLN: 3.0000 mL | Freq: Three times a day (TID) | RESPIRATORY_TRACT | Status: DC

## 2023-12-15 MED ORDER — AMIODARONE HCL 200 MG PO TABS: ORAL_TABLET | ORAL | Status: DC

## 2023-12-15 MED ORDER — CLONIDINE HCL 0.2 MG PO TABS: 0.2000 mg | ORAL_TABLET | Freq: Two times a day (BID) | ORAL | Status: DC

## 2023-12-15 MED ORDER — APIXABAN 2.5 MG PO TABS: 2.5000 mg | ORAL_TABLET | Freq: Two times a day (BID) | ORAL | Status: DC

## 2023-12-15 MED ORDER — INSULIN ASPART 100 UNIT/ML IJ SOLN: 3.0000 [IU] | Freq: Three times a day (TID) | INTRAMUSCULAR | Status: DC

## 2023-12-15 MED ORDER — SODIUM CHLORIDE 0.9 % IV SOLN: 600.0000 mg | INTRAVENOUS | Status: DC

## 2023-12-15 NOTE — Plan of Care (Signed)
  Problem: Coping: Goal: Ability to adjust to condition or change in health will improve Outcome: Progressing   Problem: Fluid Volume: Goal: Ability to maintain a balanced intake and output will improve Outcome: Progressing   Problem: Clinical Measurements: Goal: Ability to maintain clinical measurements within normal limits will improve Outcome: Progressing   Problem: Clinical Measurements: Goal: Diagnostic test results will improve Outcome: Progressing   Problem: Clinical Measurements: Goal: Respiratory complications will improve Outcome: Progressing

## 2023-12-15 NOTE — Progress Notes (Signed)
 [facility]  ROUNDING NOTE   Subjective:  [name] is a 78 y.o.  female with past medical history of hypertension, long-standing diabetes mellitus type 2, lower extremity edema, hyperlipidemia, tobacco abuse, prior history of CVA, COPD, obstructive sleep apnea, lumbar spinal fusion, left upper lobe lung mass who presents with volume overload and increasing shortness of breath.   Update:  Patient seen sitting in chair, alert and oriented Daughter at bedside Appears well today Continues to request discharge plan and challenges.  Objective:  Vital signs in last 24 hours:  Temp:  [98.1 F (36.7 C)-99.1 F (37.3 C)] 98.3 F (36.8 C) (06/30 1103) Pulse Rate:  [66-81] 70 (06/30 1103) Resp:  [16-20] 20 (06/30 1103) BP: (127-165)/(51-74) 142/56 (06/30 1103) SpO2:  [96 %-100 %] 96 % (06/30 1103) Weight:  [83.5 kg] 83.5 kg (06/30 0500)  Weight change: 3.662 kg Filed Weights   12/13/23 1227 12/14/23 0500 12/15/23 0500  Weight: 78.9 kg 83 kg 83.5 kg    Intake/Output: I/O last 3 completed shifts: In: 1186 [P.O.:1186] Out: 75 [Urine:75]   Intake/Output this shift:  No intake/output data recorded.  Physical Exam: General: No acute distress  Head: Normocephalic, atraumatic. Dry mucosal membranes  Eyes: Anicteric  Neck: Supple  Lungs:  Normal breathing effort  Heart: Regular rate and rhythm  Abdomen:  Soft, nontender, BS present  Extremities: No peripheral edema.  Neurologic: Alert, moving all four extremities  Skin: No lesions  Dialysis Access: Right IJ PermCath placed on 6/25    Basic Metabolic Panel: Recent Labs  Lab 12/09/23 0509 12/10/23 1400 12/11/23 0811 12/12/23 0050 12/13/23 0853  NA 132* 127* 132* 134* 131*  K 3.8 3.6 3.7 3.6 3.6  CL 95* 91* 96* 97* 94*  CO2 26 24 27 27 25   GLUCOSE 218* 238* 222* 165* 143*  BUN 41* 51* 28* 18 29*  CREATININE 3.78* 4.46* 2.71* 2.06* 3.06*  CALCIUM  8.1* 7.8* 8.0* 8.0* 8.4*  PHOS  --  5.4* 3.7 2.4* 3.8     Liver Function Tests: Recent Labs  Lab 12/10/23 1400 12/11/23 0811 12/12/23 0050 12/13/23 0853  ALBUMIN 1.7* 1.8* 1.8* 1.9*   No results for input(s): LIPASE, AMYLASE in the last 168 hours. No results for input(s): AMMONIA in the last 168 hours.  CBC: Recent Labs  Lab 12/11/23 0336 12/11/23 1155 12/12/23 0050 12/12/23 0420 12/13/23 0610 12/14/23 0508  WBC 10.6*  --  12.3* 12.2* 11.8* 10.4  HGB 7.1* 7.6* 7.7* 7.6* 7.3* 8.8*  HCT 22.2*  --  23.7* 23.9* 23.0* 27.4*  MCV 92.5  --  91.5 91.9 92.7 91.3  PLT 218  --  239 264 279 305    Cardiac Enzymes: Recent Labs  Lab 12/12/23 0050  CKTOTAL 21*    BNP: Invalid input(s): POCBNP  CBG: Recent Labs  Lab 12/14/23 1152 12/14/23 1538 12/14/23 2201 12/15/23 0703 12/15/23 1059  GLUCAP 124* 231* 168* 120* 161*    Microbiology: Results for orders placed or performed during the hospital encounter of 11/28/23  Culture, blood (Routine X 2) w Reflex to ID Panel     Status: Abnormal (Preliminary result)   Collection Time: 12/04/23  6:34 PM   Specimen: BLOOD  Result Value Ref Range Status   Specimen Description   Final    BLOOD BLOOD RIGHT ARM Performed at [facility], [address], [place], [place] [address]    Special Requests   Final    BOTTLES DRAWN AEROBIC AND ANAEROBIC Blood Culture  adequate volume Performed at [facility], [address] [place], [place] [address]    Culture  Setup Time   Final    Organism ID to follow IN BOTH AEROBIC AND ANAEROBIC BOTTLES GRAM POSITIVE COCCI CRITICAL RESULT CALLED TO, READ BACK BY AND VERIFIED WITH: SELINDA SIMPERS 12/06/2023 AT 9351 SRR Performed at [facility], [address] [place], [place] [address]    Culture (A)  Final    METHICILLIN RESISTANT STAPHYLOCOCCUS AUREUS Sent to [facility] for further susceptibility testing. Performed at [facility], [address], [place], [place] [address]    Report Status PENDING   Incomplete   Organism ID, Bacteria METHICILLIN RESISTANT STAPHYLOCOCCUS AUREUS  Final      Susceptibility   Methicillin resistant staphylococcus aureus - MIC*    CIPROFLOXACIN  >=8 RESISTANT Resistant     ERYTHROMYCIN  >=8 RESISTANT Resistant     GENTAMICIN  <=0.5 SENSITIVE Sensitive     OXACILLIN >=4 RESISTANT Resistant     TETRACYCLINE <=1 SENSITIVE Sensitive     VANCOMYCIN  2 SENSITIVE Sensitive     TRIMETH/SULFA <=10 SENSITIVE Sensitive     CLINDAMYCIN <=0.25 SENSITIVE Sensitive     RIFAMPIN <=0.5 SENSITIVE Sensitive     Inducible Clindamycin NEGATIVE Sensitive     LINEZOLID  2 SENSITIVE Sensitive     * METHICILLIN RESISTANT STAPHYLOCOCCUS AUREUS  Culture, blood (Routine X 2) w Reflex to ID Panel     Status: Abnormal   Collection Time: 12/04/23  6:34 PM   Specimen: BLOOD  Result Value Ref Range Status   Specimen Description   Final    BLOOD BLOOD LEFT ARM Performed at [facility], [address] [place], [place] [address]    Special Requests   Final    BOTTLES DRAWN AEROBIC AND ANAEROBIC Blood Culture adequate volume Performed at [facility], [address] [place], [place] [address]    Culture  Setup Time   Final    GRAM POSITIVE COCCI IN BOTH AEROBIC AND ANAEROBIC BOTTLES CRITICAL VALUE NOTED.  VALUE IS CONSISTENT WITH PREVIOUSLY REPORTED AND CALLED VALUE. Performed at [facility], [address] [place], [place] [address]    Culture (A)  Final    STAPHYLOCOCCUS AUREUS SUSCEPTIBILITIES PERFORMED ON PREVIOUS CULTURE WITHIN THE LAST 5 DAYS. Performed at [facility], [address], [place], [place] [address]    Report Status 12/08/2023 FINAL  Final  Blood Culture ID Panel (Reflexed)     Status: Abnormal   Collection Time: 12/04/23  6:34 PM  Result Value Ref Range Status   Enterococcus faecalis NOT DETECTED NOT DETECTED Final   Enterococcus Faecium NOT DETECTED NOT DETECTED Final   Listeria monocytogenes NOT DETECTED NOT DETECTED  Final   Staphylococcus species DETECTED (A) NOT DETECTED Final    Comment: CRITICAL RESULT CALLED TO, READ BACK BY AND VERIFIED WITH: JASON ROBBINS 12/06/2023 AT 0648 SRR    Staphylococcus aureus (BCID) DETECTED (A) NOT DETECTED Final    Comment: Methicillin (oxacillin)-resistant Staphylococcus aureus (MRSA). MRSA is predictably resistant to beta-lactam antibiotics (except ceftaroline). Preferred therapy is vancomycin  unless clinically contraindicated. Patient requires contact precautions if  hospitalized. CRITICAL RESULT CALLED TO, READ BACK BY AND VERIFIED WITH: JASON ROBBINS 12/06/2023 AT 0648 SRR    Staphylococcus epidermidis NOT DETECTED NOT DETECTED Final   Staphylococcus lugdunensis NOT DETECTED NOT DETECTED Final   Streptococcus species NOT DETECTED NOT DETECTED Final   Streptococcus agalactiae NOT DETECTED NOT DETECTED Final   Streptococcus pneumoniae NOT  DETECTED NOT DETECTED Final   Streptococcus pyogenes NOT DETECTED NOT DETECTED Final   A.calcoaceticus-baumannii NOT DETECTED NOT DETECTED Final   Bacteroides fragilis NOT DETECTED NOT DETECTED Final   Enterobacterales NOT DETECTED NOT DETECTED Final   Enterobacter cloacae complex NOT DETECTED NOT DETECTED Final   Escherichia coli NOT DETECTED NOT DETECTED Final   Klebsiella aerogenes NOT DETECTED NOT DETECTED Final   Klebsiella oxytoca NOT DETECTED NOT DETECTED Final   Klebsiella pneumoniae NOT DETECTED NOT DETECTED Final   Proteus species NOT DETECTED NOT DETECTED Final   Salmonella species NOT DETECTED NOT DETECTED Final   Serratia marcescens NOT DETECTED NOT DETECTED Final   Haemophilus influenzae NOT DETECTED NOT DETECTED Final   Neisseria meningitidis NOT DETECTED NOT DETECTED Final   Pseudomonas aeruginosa NOT DETECTED NOT DETECTED Final   Stenotrophomonas maltophilia NOT DETECTED NOT DETECTED Final   Candida albicans NOT DETECTED NOT DETECTED Final   Candida auris NOT DETECTED NOT DETECTED Final   Candida glabrata  NOT DETECTED NOT DETECTED Final   Candida krusei NOT DETECTED NOT DETECTED Final   Candida parapsilosis NOT DETECTED NOT DETECTED Final   Candida tropicalis NOT DETECTED NOT DETECTED Final   Cryptococcus neoformans/gattii NOT DETECTED NOT DETECTED Final   Meth resistant mecA/C and MREJ DETECTED (A) NOT DETECTED Final    Comment: CRITICAL RESULT CALLED TO, READ BACK BY AND VERIFIED WITH: JASON ROBBINS 12/06/2023 AT 9351 SRR Performed at [facility], [address] [place], [place] [address]   MIC (1 Drug)-blood culture; 12/04/2023; BLOOD RIGHT ARM; MRSA; Daptomycin      Status: Abnormal   Collection Time: 12/04/23  6:34 PM   Specimen: BLOOD RIGHT ARM  Result Value Ref Range Status   Min Inhibitory Conc (1 Drug) Final report (A)  Corrected    Comment: (NOTE) Performed At: [facility] [place] [address] [place], [place] [id number] Jennette Shorter MD Ey:[id number] CORRECTED ON 06/29 AT 1235: PREVIOUSLY REPORTED AS Preliminary report    Source BLOOD CULTURE  Final    Comment: Performed at [facility], [address] [place], [place] [address]  MIC Result     Status: Abnormal   Collection Time: 12/04/23  6:34 PM  Result Value Ref Range Status   Result 1 (MIC) Comment (A)  Final    Comment: (NOTE) Methicillin - resistant Staphylococcus aureus Identification performed by account, not confirmed by this laboratory. Testing performed by a combination of broth microdilution and gradient elution. DAPTOMYCIN   0.50 ug/mL SUSCEPTIBLE Performed At: [facility] [address] [place], [place] [id number] Jennette Shorter MD Ey:[id number]   SARS Coronavirus 2 by RT PCR (hospital order, performed in [facility] hospital lab) *cepheid single result test* Anterior Nasal Swab     Status: None   Collection Time: 12/04/23  8:40 PM   Specimen: Anterior Nasal Swab  Result Value Ref Range Status   SARS Coronavirus 2 by RT PCR NEGATIVE NEGATIVE Final    Comment:  (NOTE) SARS-CoV-2 target nucleic acids are NOT DETECTED.  The SARS-CoV-2 RNA is generally detectable in upper and lower respiratory specimens during the acute phase of infection. The lowest concentration of SARS-CoV-2 viral copies this assay can detect is 250 copies / mL. A negative result does not preclude SARS-CoV-2 infection and should not be used as the sole basis for treatment or other patient management decisions.  A negative result may occur with improper specimen collection / handling, submission of specimen other than nasopharyngeal swab, presence of viral mutation(s) within the areas  targeted by this assay, and inadequate number of viral copies (<250 copies / mL). A negative result must be combined with clinical observations, patient history, and epidemiological information.  Fact Sheet for Patients:   RoadLapTop.co.za  Fact Sheet for Healthcare Providers: http://kim-miller.com/  This test is not yet approved or  cleared by the [place]  FDA and has been authorized for detection and/or diagnosis of SARS-CoV-2 by FDA under an Emergency Use Authorization (EUA).  This EUA will remain in effect (meaning this test can be used) for the duration of the COVID-19 declaration under Section 564(b)(1) of the Act, 21 U.S.C. section 360bbb-3(b)(1), unless the authorization is terminated or revoked sooner.  Performed at [facility], [address] [place], [place] [address]   Urine Culture (for pregnant, neutropenic or urologic patients or patients with an indwelling urinary catheter)     Status: Abnormal   Collection Time: 12/04/23  8:45 PM   Specimen: Urine, Clean Catch  Result Value Ref Range Status   Specimen Description   Final    URINE, CLEAN CATCH Performed at [facility], [address], [place], [place] [address]    Special Requests   Final    NONE Performed at [facility], [address] [place], [place] [address]    Culture (A)  Final    <10,000 COLONIES/mL INSIGNIFICANT GROWTH Performed at [facility], [address], [place], [place] [address]    Report Status 12/06/2023 FINAL  Final  Respiratory (~20 pathogens) panel by PCR     Status: None   Collection Time: 12/04/23 11:30 PM   Specimen: Nasopharyngeal Swab; Respiratory  Result Value Ref Range Status   Adenovirus NOT DETECTED NOT DETECTED Final   Coronavirus 229E NOT DETECTED NOT DETECTED Final    Comment: (NOTE) The Coronavirus on the Respiratory Panel, DOES NOT test for the novel  Coronavirus (2019 nCoV)    Coronavirus HKU1 NOT DETECTED NOT DETECTED Final   Coronavirus NL63 NOT DETECTED NOT DETECTED Final   Coronavirus OC43 NOT DETECTED NOT DETECTED Final   Metapneumovirus NOT DETECTED NOT DETECTED Final   Rhinovirus / Enterovirus NOT DETECTED NOT DETECTED Final   Influenza A NOT DETECTED NOT DETECTED Final   Influenza B NOT DETECTED NOT DETECTED Final   Parainfluenza Virus 1 NOT DETECTED NOT DETECTED Final   Parainfluenza Virus 2 NOT DETECTED NOT DETECTED Final   Parainfluenza Virus 3 NOT DETECTED NOT DETECTED Final   Parainfluenza Virus 4 NOT DETECTED NOT DETECTED Final   Respiratory Syncytial Virus NOT DETECTED NOT DETECTED Final   Bordetella pertussis NOT DETECTED NOT DETECTED Final   Bordetella Parapertussis NOT DETECTED NOT DETECTED Final   Chlamydophila pneumoniae NOT DETECTED NOT DETECTED Final   Mycoplasma pneumoniae NOT DETECTED NOT DETECTED Final    Comment: Performed at [facility], [address] [place], [place] [address]  Cath Tip Culture     Status: None   Collection Time: 12/06/23  2:45 PM   Specimen: Catheter Tip; Other  Result Value Ref Range Status   Specimen Description   Final    CATH TIP Performed at [facility], [address] [place], [place] [address]    Special Requests   Final    NONE Performed at [facility], [address],  [place], [place] [address]    Culture   Final    NO GROWTH 2 DAYS Performed at [facility], [address] [place], [place] [address]  Report Status 12/10/2023 FINAL  Final  MRSA Next Gen by PCR, Nasal     Status: Abnormal   Collection Time: 12/07/23  9:00 AM   Specimen: Nasal Mucosa; Nasal Swab  Result Value Ref Range Status   MRSA by PCR Next Gen DETECTED (A) NOT DETECTED Final    Comment: RESULT CALLED TO, READ BACK BY AND VERIFIED WITH: ELIZABETH HAYS 12/07/23 1041 SLM (NOTE) The GeneXpert MRSA Assay (FDA approved for NASAL specimens only), is one component of a comprehensive MRSA colonization surveillance program. It is not intended to diagnose MRSA infection nor to guide or monitor treatment for MRSA infections. Test performance is not FDA approved in patients less than 47 years old. Performed at [facility], [address] [place], [place] [address]   Culture, blood (Routine X 2) w Reflex to ID Panel     Status: None   Collection Time: 12/07/23 12:50 PM   Specimen: BLOOD  Result Value Ref Range Status   Specimen Description BLOOD RIGHT ANTECUBITAL  Final   Special Requests   Final    BOTTLES DRAWN AEROBIC AND ANAEROBIC Blood Culture adequate volume   Culture   Final    NO GROWTH 5 DAYS Performed at [facility], [address], [place], [place] [address]    Report Status 12/12/2023 FINAL  Final  Culture, blood (Routine X 2) w Reflex to ID Panel     Status: None   Collection Time: 12/07/23 12:50 PM   Specimen: BLOOD  Result Value Ref Range Status   Specimen Description BLOOD LEFT ANTECUBITAL  Final   Special Requests   Final    BOTTLES DRAWN AEROBIC AND ANAEROBIC Blood Culture adequate volume   Culture   Final    NO GROWTH 5 DAYS Performed at [facility], [address], [place], [place] [address]    Report Status 12/12/2023 FINAL  Final    Coagulation Studies: No results for input(s): LABPROT, INR in the last 72  hours.  Urinalysis: No results for input(s): COLORURINE, LABSPEC, PHURINE, GLUCOSEU, HGBUR, BILIRUBINUR, KETONESUR, PROTEINUR, UROBILINOGEN, NITRITE, LEUKOCYTESUR in the last 72 hours.  Invalid input(s): APPERANCEUR     Imaging: No results found.     Medications:    anticoagulant sodium citrate      [START ON 12/16/2023] DAPTOmycin       sodium chloride    Intravenous Once   sodium chloride    Intravenous Once   amiodarone   400 mg Oral BID   Followed by   NOREEN ON 12/21/2023] amiodarone   200 mg Oral Daily   apixaban   2.5 mg Oral BID   aspirin  EC  81 mg Oral QHS   Chlorhexidine  Gluconate Cloth  6 each Topical Q0600   cloNIDine   0.2 mg Oral BID   diltiazem   120 mg Oral Daily   epoetin  alfa-epbx (RETACRIT ) injection  4,000 Units Intravenous Q T,Th,Sat-1800   insulin  aspart  0-5 Units Subcutaneous QHS   insulin  aspart  0-9 Units Subcutaneous TID WC   insulin  aspart  3 Units Subcutaneous TID WC   insulin  glargine-yfgn  10 Units Subcutaneous QHS   ipratropium-albuterol   3 mL Nebulization TID   lactose free nutrition  237 mL Oral TID WC   lidocaine   10 mL Intradermal Once   losartan   100 mg Oral QHS   metoprolol  succinate  100 mg Oral QHS   multivitamin  1 tablet Oral QHS   senna-docusate  2 tablet Oral BID   torsemide   40 mg Oral Daily  vancomycin  variable dose per unstable renal function (pharmacist dosing)   Does not apply See admin instructions   acetaminophen  **OR** acetaminophen , albuterol , anticoagulant sodium citrate , guaiFENesin -dextromethorphan , hydrALAZINE , loperamide, magic mouthwash, ondansetron  **OR** ondansetron  (ZOFRAN ) IV, oxyCODONE -acetaminophen , zolpidem   Assessment/ Plan:  [name] is a 78 y.o.  female  with past medical history of hypertension, long-standing diabetes mellitus type 2, lower extremity edema, hyperlipidemia, tobacco abuse, prior history of CVA, COPD, obstructive sleep apnea, lumbar spinal fusion, left upper lobe  lung mass who presents with volume overload and increasing shortness of breath.   1.  Acute kidney injury/chronic kidney disease stage IV/diabetes mellitus type 2 with chronic kidney disease/proteinuria/MRSA bacteremia.  eGFR down to 11.  Baseline creatinine 2.7 in February 2025.  Creatinine increased to 3.6-4 in June 2025. - PermCath originally placed  (12/01/2023) and removed 12/06/2023.  Negative TEE 12/08/2023. - Case discussed with infectious diseases on 12/09/2023.  Antibiotics were changed to daptomycin  600 mg with outpatient dialysis until 7/19.  Notified outpatient clinic who verified they have medication in stock and available for first treatment on Tuesday. - Appreciate vascular surgery placing permcath on 6/25 -Outpatient dialysis seat at [facility] [address] has been secured, TTS. Patient to arrive at 11:30 AM. Able to start Tuesday.  - Next treatment scheduled for Tuesday.   Lab Results  Component Value Date   CREATININE 3.06 (H) 12/13/2023   CREATININE 2.06 (H) 12/12/2023   CREATININE 2.71 (H) 12/11/2023     Intake/Output Summary (Last 24 hours) at 12/15/2023 1222 Last data filed at 12/14/2023 2240 Gross per 24 hour  Intake --  Output 75 ml  Net -75 ml     2.  Hyponatremia.  Serum sodium decreased but stable  3.  Hyperkalemia Resolved, well managed with dialysis   4.  HTN with chronic kidney disease - 2D echo from 11/29/2023 shows LVEF 55 to 60%, grade 2 diastolic dysfunction, trivial mitral regurgitation Continue clonidine , losartan , and metoprolol . Blood pressure remains stable for this patient.   5.  Anemia of chronic kidney disease.  Patient has  left upper lobe mass concerning for malignancy.  Discussed with Dr. Jacobo. Hgb 8.8 at last check    LOS: [address] [id number] PM

## 2023-12-15 NOTE — TOC Progression Note (Addendum)
 Transition of Care [facility]) - Progression Note    Patient Details  Name: Mckenzie Lawrence MRN: [id number] Date of Birth: [birth year]  [facility] CM/SW Contact  Lorraine LILLETTE [place], [place] Phone Number: 12/15/2023, 11:50 AM  Clinical Narrative:    CSW reviewed file, and contacted [facility] regarding updated documentation and steps to have decision reviewed on transportation. Tammy at [facility]  stated options are an Appeal or resubmit for medical review. CSW shared information with daughter and then CSW called [facility] back to request Medical review of decision based on updated notes.  CSW sent text to daughter advising of Med review in place.   11:50 call from [facility]- Transportation with [facility] approved.  Approval # [id number].  CSW to facilitate DC and transportation.   Addendum: CSW followed up with [facility], advised pt DC today. No other TOC needs.     Barriers to Discharge: No Barriers Identified  Expected Discharge Plan and Services         Expected Discharge Date: 12/15/23                                     Social Determinants of Health (SDOH) Interventions SDOH Screenings   Food Insecurity: No Food Insecurity (11/28/2023)  Housing: Low Risk  (11/28/2023)  Transportation Needs: No Transportation Needs (11/28/2023)  Utilities: Not At Risk (11/28/2023)  Depression (PHQ2-9): Low Risk  (08/05/2023)  Financial Resource Strain: Low Risk  (10/31/2023)   Received from [facility]  Social Connections: Unknown (11/28/2023)  Tobacco Use: High Risk (12/08/2023)    Readmission Risk Interventions    07/22/2023   10:56 AM 08/13/2022    1:04 PM 11/10/2021    2:49 PM  Readmission Risk Prevention Plan  Transportation Screening Complete Complete Complete  PCP or Specialist Appt within 5-7 Days  Complete   PCP or Specialist Appt within 3-5 Days Complete  Complete  Home Care Screening  Complete   Medication Review (RN CM)  Complete   HRI or Home Care Consult  Complete  Complete  Social Work Consult for Recovery Care Planning/Counseling Complete  Complete  Palliative Care Screening Not Applicable  Not Applicable  Medication Review Oceanographer) Complete  Complete

## 2023-12-15 NOTE — Progress Notes (Signed)
 Patient slept through the night, no C/O pain or discomfort.

## 2023-12-15 NOTE — Discharge Summary (Addendum)
 Physician Discharge Summary   Patient: Mckenzie Lawrence MRN: [id number] DOB: 20-Feb-1946  Admit date:     11/28/2023  Discharge date: 12/15/23  Discharge Physician: Murvin Mana   PCP: [name], MD   Recommendations at discharge:   Follow-up with PCP in 1 week. Follow-up with nephrology for continued dialysis and antibiotics. Follow-up with the infectious disease in 2 to 3 weeks  Discharge Diagnoses: Principal Problem:   Hypertensive urgency/emergency Active Problems:   COPD with acute exacerbation (HCC)   Acute dyspnea   CAD (coronary artery disease)   Chest pain   Acute renal failure superimposed on stage 4 chronic kidney disease (HCC)   Hyponatremia   MRSA (methicillin resistant Staphylococcus aureus) septicemia (HCC)   Hypertensive emergency   Encounter for dialysis catheter care (HCC)   Paroxysmal atrial fibrillation (HCC)   ESRD (end stage renal disease) (HCC)   Acute on chronic diastolic CHF (congestive heart failure) (HCC)  Resolved Problems:   * No resolved hospital problems. [facility] Course: Mckenzie Lawrence is a 78 y.o. female with medical history significant for COPD, CVA, stage 4 CKD, SIADH, esophageal dysphagia treated with Botox  (last in 05/2023), DM, HLD, and HTN, last hospitalized from 2/1 to 07/26/2023 with MRSA bacteremia/sepsis/multifocal pneumonia, now presenting by EMS with shortness of breath and chest pressure.   She has no orthopnea and sleeps on 1 pillow.  She has a chronic congested cough that has not changed and has no fever or chills  BP on arrival 202/74 and tachypneic to 25 saturating at 98% on room air. WBC 11,000 with hemoglobin of 9 which is her baseline. Troponin 19 and BNP 2450 Creatinine 3.59 up from baseline of 2.74 with bicarb 19.  Glucose 207, potassium 5.2, sodium 123 (137 four months ago) EKG with sinus rhythm at 79 Chest x-ray showing small right pleural effusion and vascular congestion Patient treated with DuoNeb and Solu-Medrol   and given a dose of Lasix  Patient was diagnosed with acute on chronic diastolic congestive heart failure, she also had progressed to end-stage renal disease.  She was treated with initially with IV Lasix , then hemodialysis was started on 6/17. Blood culture positive again for MRSA, started on vancomycin .  Patient condition had improved, medically stable for discharge.  Based on MIC, antibiotic was switched to daptomycin .  Assessment and Plan: MRSA septicemia:  Patient had a fever of 102.4 on 6/19, at the same time, heart rate of 92, leukocytosis of 12.1. Blood culture was positive for MRSA. Hx mrsa bacteremia 2/24 2/2 infected spinal hardware, had recurrent bout of mrsa bacteremia earlier this year no source identified. Has dialysis catheter placed this hospitalization, removed by vascular on 6/21. PIV also exchanged Repeat blood cultures ordered 6/22, ngtd. TTE/TEE negative. ID following.  Planning for vancomycin  for the least 4 weeks, too early to perform another scan of the spine. Based on MIC, antibiotic switched to daptomycin  for 4 weeks.  Patient will be followed by infect disease in 2 to 3 weeks.   Paroxysmal a-fib:  new overnight 6/22, now back in sinus.  Patient started on Eliquis , started having nasal bleeding.  Decrease dose to 2.5 mg twice a day due to end-stage renal disease 6/26. Nasal bleeding has resolved. Patient will continue amiodarone , follow-up with cardiology as outpatient.   Hypertensive emergency:  Blood pressure well-controlled after given blood pressure medicine and dialysis.   Acute on chronic diastolic CHF:  w/ elevated BNP, CXR showing vascular congestion, pitting LE edema & dyspnea. Echo shows EF  50-55%, grade II diastolic dysfunction, no regional wall motion abnormalities. Patient on regular dialysis, volume status much better.   COPD exacerbation:  Treated with steroids, condition has stabilized.  However, patient still having significant cough with large  amount of mucus.  DuoNeb scheduled. Patient currently on oxygen without documented hypoxemia, off oxygen.   Chest pain: w/ hx of CAD. Troponins are minimally elevated.  EKG shows no ischemic changes. Continue on losartan , metoprolol , statin, aspirin . No CP currently    ESRD progressing from CKDIV Hyperkalemia secondary to end-stage renal disease. Hyponatremia. Anemia of chronic kidney disease. : tunneled HD cath placed. Started on scheduled HD 12/02/23.  Permacath placed on 6/25. Patient has significant anemia, hemoglobin dropped down to 6.3 today, received 1 unit PRBC, repeat hemoglobin today 7.6.  Recent iron study in 07/2023 showed a normal iron and B12 level.  No evidence of active bleeding. Patient received another unit of PRBC 6/28.  Hemoglobin improved.   Anxiety: severity unknown. Valium  discontinued 6/19    Esophageal dysphagia: s/p botox  via EGD in 08/2022. Aspiration precautions. Continue w/ supportive care   OSA: CPAP qhs   DM2: well controlled at baseline, HbA1c 6.1. sugars labile here, on semglee /ssi   Debility: PT rec is now snf, toc consulted.     Constipation. Condition resolved after giving stool softener         Consultants: Neurology, cardiology, infectious disease. Procedures performed: HD  Disposition: Skilled nursing facility Diet recommendation:  Discharge Diet Orders (From admission, onward)     Start     Ordered   12/15/23 0000  Diet general       Comments: Renal diet   12/15/23 1038           Renal diet DISCHARGE MEDICATION: Allergies as of 12/15/2023       Reactions   Iodine Anaphylaxis, Other (See Comments)   Pt states that she is allergic to ingested iodine only, okay for betadine.     Iodine I-131 Tositumomab Anaphylaxis   Shellfish Allergy Anaphylaxis   Codeine Nausea And Vomiting   Morphine  Sulfate Nausea And Vomiting   Irbesartan Other (See Comments)    Unknown  (Avapro)   Sulfa Antibiotics Other (See Comments)   Fever          Medication List     STOP taking these medications    albuterol  108 (90 Base) MCG/ACT inhaler Commonly known as: VENTOLIN  HFA   amLODipine  10 MG tablet Commonly known as: NORVASC    diazepam  5 MG tablet Commonly known as: VALIUM    estradiol  0.1 MG/GM vaginal cream Commonly known as: ESTRACE    Gemtesa 75 MG Tabs Generic drug: Vibegron   insulin  detemir 100 UNIT/ML injection Commonly known as: LEVEMIR    nitrofurantoin  100 MG capsule Commonly known as: MACRODANTIN    nystatin  100000 UNIT/ML suspension Commonly known as: MYCOSTATIN    rosuvastatin  5 MG tablet Commonly known as: CRESTOR    sodium bicarbonate  650 MG tablet   sucralfate  1 g tablet Commonly known as: CARAFATE        TAKE these medications    acetaminophen  500 MG tablet Commonly known as: TYLENOL  Take 1,000 mg by mouth every 6 (six) hours as needed for moderate pain.   amiodarone  200 MG tablet Commonly known as: PACERONE  Take 2 tablets (400 mg total) by mouth 2 (two) times daily for 6 days, THEN 1 tablet (200 mg total) daily. Start taking on: December 15, 2023   apixaban  2.5 MG Tabs tablet Commonly known as: ELIQUIS  Take 1 tablet (2.5  mg total) by mouth 2 (two) times daily.   aspirin  EC 81 MG tablet Take 81 mg by mouth at bedtime. Swallow whole.   BD Insulin  Syringe U/F 31G X 5/16 1 ML Misc Generic drug: Insulin  Syringe-Needle U-100 USE 1 SYRINGE AS DIRECTED   calcitRIOL  0.25 MCG capsule Commonly known as: ROCALTROL  Take 0.25 mcg by mouth at bedtime.   cholecalciferol 25 MCG (1000 UNIT) tablet Commonly known as: VITAMIN D3 Take 1,000 Units by mouth daily.   cloNIDine  0.2 MG tablet Commonly known as: CATAPRES  Take 1 tablet (0.2 mg total) by mouth 2 (two) times daily.   cyclobenzaprine  10 MG tablet Commonly known as: FLEXERIL  Take 1 tablet (10 mg total) by mouth 3 (three) times daily as needed for muscle spasms.   D-MANNOSE PO Take 2 tablets by mouth at bedtime.   daptomycin   IVPB Commonly known as: CUBICIN  Inject 600 mg into the vein Every Tuesday,Thursday,and Saturday with dialysis for 18 days. Give Daptomycin  600mg  IV with hemodialysis on Tue, Thur, Sat Indication:  MRSA bacteremia  Last Day of Therapy:  01/04/2024 (last dose to be given at HD on 7/19) Labs - Once weekly:  CBC/D, CMP, CRP, and CPK FAX weekly labs to [phone] Start taking on: December 16, 2023   diltiazem  120 MG 24 hr capsule Commonly known as: CARDIZEM  CD Take 1 capsule (120 mg total) by mouth daily. Start taking on: December 16, 2023   diphenhydrAMINE  25 MG tablet Commonly known as: BENADRYL  Take 50 mg by mouth at bedtime as needed for sleep.   docusate sodium  100 MG capsule Commonly known as: COLACE Take 1 capsule (100 mg total) by mouth 2 (two) times daily. What changed:  when to take this reasons to take this   epoetin  alfa-epbx 4000 UNIT/ML injection Commonly known as: RETACRIT  Inject 1 mL (4,000 Units total) into the skin once a week.   Fe Fum-Vit C-Vit B12-FA Caps capsule Commonly known as: TRIGELS-F FORTE Take 1 capsule by mouth daily after breakfast.   feeding supplement Liqd Take 1 Container by mouth 2 (two) times daily between meals.   FreeStyle Libre 2 Sensor Misc   insulin  aspart 100 UNIT/ML injection Commonly known as: novoLOG  Inject 3 Units into the skin 3 (three) times daily with meals.   insulin  glargine-yfgn 100 UNIT/ML Pen Commonly known as: SEMGLEE  Inject 10 Units into the skin 2 (two) times daily.   ipratropium-albuterol  0.5-2.5 (3) MG/3ML Soln Commonly known as: DUONEB Take 3 mLs by nebulization 3 (three) times daily.   lidocaine  1 % (with preservative) injection Commonly known as: XYLOCAINE  Inject 10 mLs into the skin once for 1 dose.   losartan  100 MG tablet Commonly known as: COZAAR  Take 1 tablet (100 mg total) by mouth daily. What changed: when to take this   magic mouthwash Soln Take 5 mLs by mouth 4 (four) times daily as needed for mouth  pain. Suspension contains equal amounts of Maalox Extra Strength, nystatin , and diphenhydramine .   metoprolol  succinate 100 MG 24 hr tablet Commonly known as: TOPROL -XL Take 100 mg by mouth at bedtime.   OneTouch Ultra test strip Generic drug: glucose blood 4 (four) times daily.   oxyCODONE -acetaminophen  5-325 MG tablet Commonly known as: PERCOCET/ROXICET Take 1 tablet by mouth every 8 (eight) hours as needed for moderate pain (pain score 4-6).   PRESERVISION AREDS 2+MULTI VIT PO Take 1 capsule by mouth daily.   sennosides-docusate sodium  8.6-50 MG tablet Commonly known as: SENOKOT-S Take 1 tablet by mouth daily as  needed for constipation.   Torsemide  40 MG Tabs Take 40 mg by mouth daily. Start taking on: December 16, 2023   zolpidem  5 MG tablet Commonly known as: AMBIEN  Take 0.5 tablets (2.5 mg total) by mouth at bedtime as needed for sleep. What changed:  medication strength how much to take               Home Infusion Instuctions  (From admission, onward)           Start     Ordered   12/15/23 0000  Home infusion instructions       Question Answer Comment  Instructions Other   Comments to get antibiotic at HD center      12/15/23 1038   12/13/23 0000  Home infusion instructions       Question Answer Comment  Instructions Other   Comments To receive vancomycin  at HD center      12/13/23 0807              Discharge Care Instructions  (From admission, onward)           Start     Ordered   12/15/23 0000  Discharge wound care:       Comments: Follow with RN   12/15/23 1038            Contact information for follow-up providers     [place], Caralyn, PA-C. Go in 4 day(s).   Specialty: Cardiology Why: Appointment scheduled for Friday 6/20 at 9:30 AM with [facility] Contact information: [address] [place] [address] [phone]         Marea Selinda RAMAN, MD Follow up in 3 week(s).   Specialties:  Vascular Surgery, Radiology, Interventional Cardiology Why: with vein mapping study Contact information: [address] [place] [address] [phone]         Ammon Blunt, MD. Go in 2 week(s).   Specialty: Cardiology Contact information: [address] [facility] [place] [address] [phone]         Epifanio Alm SQUIBB, MD Follow up in 2 week(s).   Specialty: Infectious Diseases Contact information: [address] [place] [address] [phone]              Contact information for after-discharge care     Destination     [facility] [place], [place]   Service: Skilled Nursing Contact information: [address] [place]  [address] [phone]                    Discharge Exam: Filed Weights   12/13/23 1227 12/14/23 0500 12/15/23 0500  Weight: 78.9 kg 83 kg 83.5 kg   General exam: Appears calm and comfortable  Respiratory system: Clear to auscultation. Respiratory effort normal. Cardiovascular system: S1 & S2 heard, RRR. No JVD, murmurs, rubs, gallops or clicks.  Gastrointestinal system: Abdomen is nondistended, soft and nontender. No organomegaly or masses felt. Normal bowel sounds heard. Central nervous system: Alert and oriented. No focal neurological deficits. Extremities: 2+ leg edema Skin: No rashes, lesions or ulcers Psychiatry: Judgement and insight appear normal. Mood & affect appropriate.    Condition at discharge: fair  The results of significant diagnostics from this hospitalization (including imaging, microbiology, ancillary and laboratory) are listed below for reference.   Imaging Studies: PERIPHERAL VASCULAR CATHETERIZATION Result Date: 12/10/2023 See surgical note for result.  ECHO TEE Result Date: 12/08/2023  TRANSESOPHOGEAL ECHO REPORT   Patient Name:   JAKELIN TAUSSIG Date of Exam: 12/08/2023 Medical Rec #:  [id number]      Height:        67.0 in Accession #:    [id number]     Weight:       173.7 lb Date of Birth:  October 14, 1945      BSA:          1.905 m Patient Age:    78 years       BP:           131/52 mmHg Patient Gender: F              HR:           69 bpm. Exam Location:  [facility] Procedure: Transesophageal Echo, Cardiac Doppler, Color Doppler and 3D Echo            (Both Spectral and Color Flow Doppler were utilized during            procedure). Indications:     Bacteremia R78.81  History:         Patient has prior history of Echocardiogram examinations, most                  recent 12/07/2023. Risk Factors:Hypertension and Sleep Apnea.  Sonographer:     Christopher Furnace Referring Phys:  [id number] DWAYNE D CALLWOOD Diagnosing Phys: Keller Paterson PROCEDURE: After discussion of the risks and benefits of a TEE, an informed consent was obtained from the patient. The transesophogeal probe was passed without difficulty through the esophogus of the patient. Local oropharyngeal anesthetic was provided with Cetacaine . Sedation performed by different physician. The patient was monitored while under deep sedation. Image quality was excellent. The patient's vital signs; including heart rate, blood pressure, and oxygen saturation; remained stable throughout the procedure. The patient developed no complications during the procedure.  IMPRESSIONS  1. Left ventricular ejection fraction, by estimation, is 50 to 55%. The left ventricle has low normal function.  2. Right ventricular systolic function is normal. The right ventricular size is normal.  3. No left atrial/left atrial appendage thrombus was detected.  4. The mitral valve is normal in structure. Trivial mitral valve regurgitation.  5. The aortic valve is tricuspid. There is mild thickening of the aortic valve. Aortic valve regurgitation is not visualized.  6. 3D performed of the mitral valve and demonstrates no valvular vegetation. Conclusion(s)/Recommendation(s): No evidence of vegetation/infective endocarditis  on this transesophageael echocardiogram. FINDINGS  Left Ventricle: Left ventricular ejection fraction, by estimation, is 50 to 55%. The left ventricle has low normal function. The left ventricular internal cavity size was normal in size. Right Ventricle: The right ventricular size is normal. No increase in right ventricular wall thickness. Right ventricular systolic function is normal. Left Atrium: Left atrial size was normal in size. No left atrial/left atrial appendage thrombus was detected. Right Atrium: Right atrial size was normal in size. Pericardium: There is no evidence of pericardial effusion. Mitral Valve: The mitral valve is normal in structure. Trivial mitral valve regurgitation. Tricuspid Valve: The tricuspid valve is normal in structure. Tricuspid valve regurgitation is trivial. Aortic Valve: The aortic valve is tricuspid. There is mild thickening of the aortic valve. Aortic valve regurgitation is not visualized. Pulmonic Valve: The pulmonic valve was normal in structure. Pulmonic valve regurgitation is trivial. Aorta: The aortic root is normal in size and structure. IAS/Shunts: No atrial level shunt detected by color flow Doppler. Additional Comments:  3D was performed not requiring image post processing on an independent workstation and was normal. Keller Paterson Electronically signed by Keller Paterson Signature Date/Time: 12/08/2023/5:20:23 PM    Final    ECHOCARDIOGRAM LIMITED Result Date: 12/07/2023    ECHOCARDIOGRAM LIMITED REPORT   Patient Name:   Mckenzie Lawrence Date of Exam: 12/07/2023 Medical Rec #:  [id number]      Height:       67.0 in Accession #:    [id number]     Weight:       171.1 lb Date of Birth:  [birth year]      BSA:          1.892 m Patient Age:    78 years       BP:           147/56 mmHg Patient Gender: F              HR:           62 bpm. Exam Location:  [facility] Procedure: Limited Echo (Both Spectral and Color Flow Doppler were utilized            during procedure). Indications:      Bacteremia R78.81  History:         Patient has prior history of Echocardiogram examinations, most                  recent 11/29/2023.  Sonographer:     Thedora Louder RDCS, FASE Referring Phys:  [phone] DWAYNE D CALLWOOD Diagnosing Phys: Cara JONETTA Lovelace MD  Sonographer Comments: Limited 2D echo requested no spectral or color flow doppler performed on this exam. IMPRESSIONS  1. Aotic valve valve slightly thickened.  2. Mitral valve wth mild thickening.  3. No obvious vegataion seen.  4. Recommed TEE.  5. Left ventricular ejection fraction, by estimation, is 60 to 65%. The left ventricle has normal function. The left ventricle has no regional wall motion abnormalities. Left ventricular diastolic function could not be evaluated.  6. Right ventricular systolic function is normal. The right ventricular size is normal.  7. The mitral valve is normal in structure.  8. The aortic valve is grossly normal. Aortic valve sclerosis is present, with no evidence of aortic valve stenosis. Conclusion(s)/Recommendation(s): Poor windows for evaluation of left ventricular function by transthoracic echocardiography. Would recommend an alternative means of evaluation. No evidence of valvular vegetations on this transthoracic echocardiogram. Consider a transesophageal echocardiogram to exclude infective endocarditis if clinically indicated. FINDINGS  Left Ventricle: Left ventricular ejection fraction, by estimation, is 60 to 65%. The left ventricle has normal function. The left ventricle has no regional wall motion abnormalities. The left ventricular internal cavity size was normal in size. There is  no left ventricular hypertrophy. Left ventricular diastolic function could not be evaluated. Right Ventricle: The right ventricular size is normal. No increase in right ventricular wall thickness. Right ventricular systolic function is normal. Left Atrium: Left atrial size was normal in size. Right Atrium: Right atrial size was normal in  size. Pericardium: There is no evidence of pericardial effusion. Mitral Valve: The mitral valve is normal in structure. There is mild thickening of the mitral valve leaflet(s). There is mild calcification of the mitral valve leaflet(s). Normal mobility of the mitral valve leaflets. Tricuspid Valve: The tricuspid valve is normal in structure. Aortic Valve: The aortic valve is grossly normal. Aortic valve sclerosis is present, with no evidence of aortic valve stenosis. Pulmonic Valve: The pulmonic valve was normal in  structure. Aorta: The ascending aorta was not well visualized. Additional Comments: No obvious vegataion seen. Aotic valve valve slightly thickened. Mitral valve wth mild thickening. Recommed TEE.  Dwayne D Callwood MD Electronically signed by Cara JONETTA Lovelace MD Signature Date/Time: 12/07/2023/1:28:01 PM    Final    DG Chest Port 1 View Result Date: 12/04/2023 CLINICAL DATA:  Fever EXAM: PORTABLE CHEST 1 VIEW COMPARISON:  11/28/2023 FINDINGS: Right internal jugular hemodialysis catheter tips are seen within the superior vena cava. Lungs appear mildly hyperinflated, stable since prior examination, suggesting changes of underlying COPD. Small right pleural effusion again noted, slightly decreased since prior examination. Focal atelectasis or infiltrate within the right costophrenic angle. Mild diffuse interstitial thickening is again noted in keeping with interstitial pulmonary edema or airway inflammation. No pneumothorax. Stable cardiomegaly. No acute bone abnormality. IMPRESSION: 1. Stable cardiomegaly. 2. Small right pleural effusion, slightly decreased since prior examination. 3. Stable interstitial thickening in keeping with interstitial pulmonary edema or airway inflammation. Electronically Signed   By: Dorethia Molt M.D.   On: 12/04/2023 20:43   PERIPHERAL VASCULAR CATHETERIZATION Result Date: 12/01/2023 See surgical note for result.  ECHOCARDIOGRAM COMPLETE Result Date: 11/29/2023     ECHOCARDIOGRAM REPORT   Patient Name:   LASHE OLIVEIRA Date of Exam: 11/29/2023 Medical Rec #:  [id number]      Height:       67.0 in Accession #:    [id number]     Weight:       176.6 lb Date of Birth:  07-16-1945      BSA:          1.918 m Patient Age:    78 years       BP:           163/51 mmHg Patient Gender: F              HR:           81 bpm. Exam Location:  [facility] Procedure: 2D Echo, Cardiac Doppler and Color Doppler (Both Spectral and Color            Flow Doppler were utilized during procedure). Indications:     CHF I50.31  History:         Patient has prior history of Echocardiogram examinations, most                  recent 07/23/2023.  Sonographer:     Thedora Louder RDCS, FASE Referring Phys:  [id number] ANTHONY CHRISTELLA POUCH Diagnosing Phys: Dwayne D Callwood MD IMPRESSIONS  1. Left ventricular ejection fraction, by estimation, is 55 to 60%. The left ventricle has normal function. The left ventricle has no regional wall motion abnormalities. The left ventricular internal cavity size was moderately dilated. Left ventricular diastolic parameters are consistent with Grade II diastolic dysfunction (pseudonormalization).  2. Right ventricular systolic function is normal. The right ventricular size is normal.  3. Left atrial size was mildly dilated.  4. The mitral valve is normal in structure. Trivial mitral valve regurgitation.  5. The aortic valve is normal in structure. Aortic valve regurgitation is not visualized. FINDINGS  Left Ventricle: Left ventricular ejection fraction, by estimation, is 55 to 60%. The left ventricle has normal function. The left ventricle has no regional wall motion abnormalities. Strain was performed and the global longitudinal strain is indeterminate. The left ventricular internal cavity size was moderately dilated. There is no left ventricular hypertrophy. Left ventricular diastolic parameters are consistent with Grade II diastolic dysfunction (pseudonormalization).  Right Ventricle: The  right ventricular size is normal. No increase in right ventricular wall thickness. Right ventricular systolic function is normal. Left Atrium: Left atrial size was mildly dilated. Right Atrium: Right atrial size was normal in size. Pericardium: There is no evidence of pericardial effusion. Mitral Valve: The mitral valve is normal in structure. Trivial mitral valve regurgitation. Tricuspid Valve: The tricuspid valve is normal in structure. Tricuspid valve regurgitation is mild. Aortic Valve: The aortic valve is normal in structure. Aortic valve regurgitation is not visualized. Aortic valve peak gradient measures 8.8 mmHg. Pulmonic Valve: The pulmonic valve was normal in structure. Pulmonic valve regurgitation is not visualized. Aorta: The ascending aorta was not well visualized. IAS/Shunts: No atrial level shunt detected by color flow Doppler. Additional Comments: 3D was performed not requiring image post processing on an independent workstation and was indeterminate.  LEFT VENTRICLE PLAX 2D LVIDd:         5.50 cm     Diastology LVIDs:         3.80 cm     LV e' medial:    7.18 cm/s LV PW:         1.00 cm     LV E/e' medial:  19.9 LV IVS:        1.10 cm     LV e' lateral:   8.38 cm/s LVOT diam:     1.80 cm     LV E/e' lateral: 17.1 LV SV:         63 LV SV Index:   33 LVOT Area:     2.54 cm  LV Volumes (MOD) LV vol d, MOD A2C: 74.1 ml LV vol d, MOD A4C: 70.9 ml LV vol s, MOD A2C: 37.0 ml LV vol s, MOD A4C: 28.8 ml LV SV MOD A2C:     37.1 ml LV SV MOD A4C:     70.9 ml LV SV MOD BP:      41.0 ml RIGHT VENTRICLE RV Basal diam:  3.20 cm RV S prime:     14.80 cm/s TAPSE (M-mode): 2.6 cm LEFT ATRIUM             Index        RIGHT ATRIUM           Index LA diam:        4.30 cm 2.24 cm/m   RA Area:     19.70 cm LA Vol (A2C):   44.9 ml 23.41 ml/m  RA Volume:   55.40 ml  28.88 ml/m LA Vol (A4C):   53.8 ml 28.05 ml/m LA Biplane Vol: 51.9 ml 27.06 ml/m  AORTIC VALVE                 PULMONIC VALVE AV Area (Vmax): 1.84 cm      PV Vmax:        1.01 m/s AV Vmax:        148.00 cm/s  PV Peak grad:   4.1 mmHg AV Peak Grad:   8.8 mmHg     RVOT Peak grad: 3 mmHg LVOT Vmax:      107.00 cm/s LVOT Vmean:     76.000 cm/s LVOT VTI:       0.249 m  AORTA Ao Root diam: 2.90 cm MITRAL VALVE MV Area (PHT): 4.86 cm     SHUNTS MV Decel Time: 156 msec     Systemic VTI:  0.25 m MV E velocity: 143.00 cm/s  Systemic Diam: 1.80 cm MV A  velocity: 80.30 cm/s MV E/A ratio:  1.78 Cara JONETTA Lovelace MD Electronically signed by Cara JONETTA Lovelace MD Signature Date/Time: 11/29/2023/10:33:04 AM    Final    US  Venous Img Lower Bilateral Result Date: 11/28/2023 CLINICAL DATA:  New onset left greater than right lower extremity swelling, without findings of CHF. EXAM: BILATERAL LOWER EXTREMITY VENOUS DOPPLER ULTRASOUND TECHNIQUE: Gray-scale sonography with graded compression, as well as color Doppler and duplex ultrasound were performed to evaluate the lower extremity deep venous systems from the level of the common femoral vein and including the common femoral, femoral, profunda femoral, popliteal and calf veins including the posterior tibial, peroneal and gastrocnemius veins when visible. The superficial great saphenous vein was also interrogated. Spectral Doppler was utilized to evaluate flow at rest and with distal augmentation maneuvers in the common femoral, femoral and popliteal veins. COMPARISON:  Left lower extremity DVT exam 08/26/2019. FINDINGS: RIGHT LOWER EXTREMITY Common Femoral Vein: No evidence of thrombus. Normal compressibility, respiratory phasicity and response to augmentation. Saphenofemoral Junction: No evidence of thrombus. Normal compressibility and flow on color Doppler imaging. Profunda Femoral Vein: No evidence of thrombus. Normal compressibility and flow on color Doppler imaging. Femoral Vein: No evidence of thrombus. Normal compressibility, respiratory phasicity and response to augmentation. Popliteal Vein: No evidence of thrombus. Normal  compressibility, respiratory phasicity and response to augmentation. Calf Veins: No evidence of thrombus. Normal compressibility and flow on color Doppler imaging. Superficial Great Saphenous Vein: No evidence of thrombus. Normal compressibility. Venous Reflux:  None. Other Findings:  Pulsatile venous waveforms. LEFT LOWER EXTREMITY Common Femoral Vein: No evidence of thrombus. Normal compressibility, respiratory phasicity and response to augmentation. Saphenofemoral Junction: No evidence of thrombus. Normal compressibility and flow on color Doppler imaging. Profunda Femoral Vein: No evidence of thrombus. Normal compressibility and flow on color Doppler imaging. Femoral Vein: No evidence of thrombus. Normal compressibility, respiratory phasicity and response to augmentation. Popliteal Vein: No evidence of thrombus. Normal compressibility, respiratory phasicity and response to augmentation. Calf Veins: No evidence of thrombus. Normal compressibility and flow on color Doppler imaging. Superficial Great Saphenous Vein: No evidence of thrombus. Normal compressibility. Venous Reflux:  None. Other Findings:  Pulsatile venous waveforms. IMPRESSION: 1. No evidence of deep venous thrombosis in either lower extremity. 2. Pulsatile venous waveforms, which can be seen with right heart failure or tricuspid regurgitation. Electronically Signed   By: Francis Quam M.D.   On: 11/28/2023 04:56   DG Chest Portable 1 View Result Date: 11/28/2023 CLINICAL DATA:  Difficulty breathing EXAM: PORTABLE CHEST 1 VIEW COMPARISON:  07/19/2023 FINDINGS: Cardiac shadow is mildly enlarged. Aortic calcifications are seen. Mild vascular congestion is noted. Small right-sided pleural effusion is noted. Metallic densities are noted symmetrically over the upper chest likely artifactual in nature. IMPRESSION: Small right pleural effusion and vascular congestion. Electronically Signed   By: Oneil Devonshire M.D.   On: 11/28/2023 02:59     Microbiology: Results for orders placed or performed during the hospital encounter of 11/28/23  Culture, blood (Routine X 2) w Reflex to ID Panel     Status: Abnormal (Preliminary result)   Collection Time: 12/04/23  6:34 PM   Specimen: BLOOD  Result Value Ref Range Status   Specimen Description   Final    BLOOD BLOOD RIGHT ARM Performed at [facility], [address], [place], [place] [address]    Special Requests   Final    BOTTLES DRAWN AEROBIC AND ANAEROBIC Blood Culture adequate volume Performed at [facility], [address] [facility] [address] [place], [place] [address]    Culture  Setup Time   Final    Organism ID to follow IN BOTH AEROBIC AND ANAEROBIC BOTTLES GRAM POSITIVE COCCI CRITICAL RESULT CALLED TO, READ BACK BY AND VERIFIED WITH: SELINDA SIMPERS 12/06/2023 AT 9351 SRR Performed at [facility], [address] [place], [place] [address]    Culture (A)  Final    METHICILLIN RESISTANT STAPHYLOCOCCUS AUREUS Sent to [facility] for further susceptibility testing. Performed at [facility], [address] [place], [place] [address]    Report Status PENDING  Incomplete   Organism ID, Bacteria METHICILLIN RESISTANT STAPHYLOCOCCUS AUREUS  Final      Susceptibility   Methicillin resistant staphylococcus aureus - MIC*    CIPROFLOXACIN  >=8 RESISTANT Resistant     ERYTHROMYCIN  >=8 RESISTANT Resistant     GENTAMICIN  <=0.5 SENSITIVE Sensitive     OXACILLIN >=4 RESISTANT Resistant     TETRACYCLINE <=1 SENSITIVE Sensitive     VANCOMYCIN  2 SENSITIVE Sensitive     TRIMETH/SULFA <=10 SENSITIVE Sensitive     CLINDAMYCIN <=0.25 SENSITIVE Sensitive     RIFAMPIN <=0.5 SENSITIVE Sensitive     Inducible Clindamycin NEGATIVE Sensitive     LINEZOLID  2 SENSITIVE Sensitive     * METHICILLIN RESISTANT STAPHYLOCOCCUS AUREUS  Culture, blood (Routine X 2) w Reflex to ID Panel     Status: Abnormal   Collection Time: 12/04/23  6:34 PM   Specimen: BLOOD  Result Value Ref  Range Status   Specimen Description   Final    BLOOD BLOOD LEFT ARM Performed at [facility], [address], [place], [place] [address]    Special Requests   Final    BOTTLES DRAWN AEROBIC AND ANAEROBIC Blood Culture adequate volume Performed at [facility], [address] [place], [place] [address]    Culture  Setup Time   Final    GRAM POSITIVE COCCI IN BOTH AEROBIC AND ANAEROBIC BOTTLES CRITICAL VALUE NOTED.  VALUE IS CONSISTENT WITH PREVIOUSLY REPORTED AND CALLED VALUE. Performed at [facility], [address] [place], [place] [address]    Culture (A)  Final    STAPHYLOCOCCUS AUREUS SUSCEPTIBILITIES PERFORMED ON PREVIOUS CULTURE WITHIN THE LAST 5 DAYS. Performed at [facility], [address], [place], [place] [address]    Report Status 12/08/2023 FINAL  Final  Blood Culture ID Panel (Reflexed)     Status: Abnormal   Collection Time: 12/04/23  6:34 PM  Result Value Ref Range Status   Enterococcus faecalis NOT DETECTED NOT DETECTED Final   Enterococcus Faecium NOT DETECTED NOT DETECTED Final   Listeria monocytogenes NOT DETECTED NOT DETECTED Final   Staphylococcus species DETECTED (A) NOT DETECTED Final    Comment: CRITICAL RESULT CALLED TO, READ BACK BY AND VERIFIED WITH: JASON ROBBINS 12/06/2023 AT 0648 SRR    Staphylococcus aureus (BCID) DETECTED (A) NOT DETECTED Final    Comment: Methicillin (oxacillin)-resistant Staphylococcus aureus (MRSA). MRSA is predictably resistant to beta-lactam antibiotics (except ceftaroline). Preferred therapy is vancomycin  unless clinically contraindicated. Patient requires contact precautions if  hospitalized. CRITICAL RESULT CALLED TO, READ BACK BY AND VERIFIED WITH: JASON ROBBINS 12/06/2023 AT 0648 SRR    Staphylococcus epidermidis NOT DETECTED NOT DETECTED Final   Staphylococcus lugdunensis NOT DETECTED NOT DETECTED Final   Streptococcus species NOT DETECTED NOT DETECTED Final   Streptococcus  agalactiae NOT DETECTED NOT DETECTED Final   Streptococcus pneumoniae NOT DETECTED NOT DETECTED Final   Streptococcus pyogenes NOT  DETECTED NOT DETECTED Final   A.calcoaceticus-baumannii NOT DETECTED NOT DETECTED Final   Bacteroides fragilis NOT DETECTED NOT DETECTED Final   Enterobacterales NOT DETECTED NOT DETECTED Final   Enterobacter cloacae complex NOT DETECTED NOT DETECTED Final   Escherichia coli NOT DETECTED NOT DETECTED Final   Klebsiella aerogenes NOT DETECTED NOT DETECTED Final   Klebsiella oxytoca NOT DETECTED NOT DETECTED Final   Klebsiella pneumoniae NOT DETECTED NOT DETECTED Final   Proteus species NOT DETECTED NOT DETECTED Final   Salmonella species NOT DETECTED NOT DETECTED Final   Serratia marcescens NOT DETECTED NOT DETECTED Final   Haemophilus influenzae NOT DETECTED NOT DETECTED Final   Neisseria meningitidis NOT DETECTED NOT DETECTED Final   Pseudomonas aeruginosa NOT DETECTED NOT DETECTED Final   Stenotrophomonas maltophilia NOT DETECTED NOT DETECTED Final   Candida albicans NOT DETECTED NOT DETECTED Final   Candida auris NOT DETECTED NOT DETECTED Final   Candida glabrata NOT DETECTED NOT DETECTED Final   Candida krusei NOT DETECTED NOT DETECTED Final   Candida parapsilosis NOT DETECTED NOT DETECTED Final   Candida tropicalis NOT DETECTED NOT DETECTED Final   Cryptococcus neoformans/gattii NOT DETECTED NOT DETECTED Final   Meth resistant mecA/C and MREJ DETECTED (A) NOT DETECTED Final    Comment: CRITICAL RESULT CALLED TO, READ BACK BY AND VERIFIED WITH: JASON ROBBINS 12/06/2023 AT 9351 SRR Performed at [facility], [address] [place], [place] [address]   MIC (1 Drug)-blood culture; 12/04/2023; BLOOD RIGHT ARM; MRSA; Daptomycin      Status: Abnormal   Collection Time: 12/04/23  6:34 PM   Specimen: BLOOD RIGHT ARM  Result Value Ref Range Status   Min Inhibitory Conc (1 Drug) Final report (A)  Corrected    Comment: (NOTE) Performed At: [facility]  [place] [address] [place], [place] [id number] Jennette Shorter MD Ey:[phone] CORRECTED ON 06/29 AT 1235: PREVIOUSLY REPORTED AS Preliminary report    Source BLOOD CULTURE  Final    Comment: Performed at [facility], [address], [place], [place] [address]  MIC Result     Status: Abnormal   Collection Time: 12/04/23  6:34 PM  Result Value Ref Range Status   Result 1 (MIC) Comment (A)  Final    Comment: (NOTE) Methicillin - resistant Staphylococcus aureus Identification performed by account, not confirmed by this laboratory. Testing performed by a combination of broth microdilution and gradient elution. DAPTOMYCIN   0.50 ug/mL SUSCEPTIBLE Performed At: [facility] [address] [place], [place] [id number] Jennette Shorter MD Ey:[phone]   SARS Coronavirus 2 by RT PCR (hospital order, performed in [facility] hospital lab) *cepheid single result test* Anterior Nasal Swab     Status: None   Collection Time: 12/04/23  8:40 PM   Specimen: Anterior Nasal Swab  Result Value Ref Range Status   SARS Coronavirus 2 by RT PCR NEGATIVE NEGATIVE Final    Comment: (NOTE) SARS-CoV-2 target nucleic acids are NOT DETECTED.  The SARS-CoV-2 RNA is generally detectable in upper and lower respiratory specimens during the acute phase of infection. The lowest concentration of SARS-CoV-2 viral copies this assay can detect is 250 copies / mL. A negative result does not preclude SARS-CoV-2 infection and should not be used as the sole basis for treatment or other patient management decisions.  A negative result may occur with improper specimen collection / handling, submission of specimen other than nasopharyngeal swab, presence of viral mutation(s) within the areas targeted by this assay, and inadequate number of viral copies (<  250 copies / mL). A negative result must be combined with clinical observations, patient history, and epidemiological information.  Fact Sheet for  Patients:   RoadLapTop.co.za  Fact Sheet for Healthcare Providers: http://kim-miller.com/  This test is not yet approved or  cleared by the [place]  FDA and has been authorized for detection and/or diagnosis of SARS-CoV-2 by FDA under an Emergency Use Authorization (EUA).  This EUA will remain in effect (meaning this test can be used) for the duration of the COVID-19 declaration under Section 564(b)(1) of the Act, 21 U.S.C. section 360bbb-3(b)(1), unless the authorization is terminated or revoked sooner.  Performed at [facility], [address], [place], [place] [address]   Urine Culture (for pregnant, neutropenic or urologic patients or patients with an indwelling urinary catheter)     Status: Abnormal   Collection Time: 12/04/23  8:45 PM   Specimen: Urine, Clean Catch  Result Value Ref Range Status   Specimen Description   Final    URINE, CLEAN CATCH Performed at [facility], [address], [place], [place] [address]    Special Requests   Final    NONE Performed at [facility], [address] [place], [place] [address]    Culture (A)  Final    <10,000 COLONIES/mL INSIGNIFICANT GROWTH Performed at [facility], [address], [place], [place] [address]    Report Status 12/06/2023 FINAL  Final  Respiratory (~20 pathogens) panel by PCR     Status: None   Collection Time: 12/04/23 11:30 PM   Specimen: Nasopharyngeal Swab; Respiratory  Result Value Ref Range Status   Adenovirus NOT DETECTED NOT DETECTED Final   Coronavirus 229E NOT DETECTED NOT DETECTED Final    Comment: (NOTE) The Coronavirus on the Respiratory Panel, DOES NOT test for the novel  Coronavirus (2019 nCoV)    Coronavirus HKU1 NOT DETECTED NOT DETECTED Final   Coronavirus NL63 NOT DETECTED NOT DETECTED Final   Coronavirus OC43 NOT DETECTED NOT DETECTED Final   Metapneumovirus NOT DETECTED NOT DETECTED Final    Rhinovirus / Enterovirus NOT DETECTED NOT DETECTED Final   Influenza A NOT DETECTED NOT DETECTED Final   Influenza B NOT DETECTED NOT DETECTED Final   Parainfluenza Virus 1 NOT DETECTED NOT DETECTED Final   Parainfluenza Virus 2 NOT DETECTED NOT DETECTED Final   Parainfluenza Virus 3 NOT DETECTED NOT DETECTED Final   Parainfluenza Virus 4 NOT DETECTED NOT DETECTED Final   Respiratory Syncytial Virus NOT DETECTED NOT DETECTED Final   Bordetella pertussis NOT DETECTED NOT DETECTED Final   Bordetella Parapertussis NOT DETECTED NOT DETECTED Final   Chlamydophila pneumoniae NOT DETECTED NOT DETECTED Final   Mycoplasma pneumoniae NOT DETECTED NOT DETECTED Final    Comment: Performed at [facility], [address], [place], [place] [address]  Cath Tip Culture     Status: None   Collection Time: 12/06/23  2:45 PM   Specimen: Catheter Tip; Other  Result Value Ref Range Status   Specimen Description   Final    CATH TIP Performed at [facility], [address], [place], [place] [address]    Special Requests   Final    NONE Performed at [facility], [address], [place], [place] [address]    Culture   Final    NO GROWTH 2 DAYS Performed at [facility], [address], [place], [place] [address]    Report Status 12/10/2023 FINAL  Final  MRSA  Next Gen by PCR, Nasal     Status: Abnormal   Collection Time: 12/07/23  9:00 AM   Specimen: Nasal Mucosa; Nasal Swab  Result Value Ref Range Status   MRSA by PCR Next Gen DETECTED (A) NOT DETECTED Final    Comment: RESULT CALLED TO, READ BACK BY AND VERIFIED WITH: ELIZABETH HAYS 12/07/23 1041 SLM (NOTE) The GeneXpert MRSA Assay (FDA approved for NASAL specimens only), is one component of a comprehensive MRSA colonization surveillance program. It is not intended to diagnose MRSA infection nor to guide or monitor treatment for MRSA infections. Test performance is not FDA approved in patients less than 49  years old. Performed at [facility], [address] [place], [place] [address]   Culture, blood (Routine X 2) w Reflex to ID Panel     Status: None   Collection Time: 12/07/23 12:50 PM   Specimen: BLOOD  Result Value Ref Range Status   Specimen Description BLOOD RIGHT ANTECUBITAL  Final   Special Requests   Final    BOTTLES DRAWN AEROBIC AND ANAEROBIC Blood Culture adequate volume   Culture   Final    NO GROWTH 5 DAYS Performed at [facility], [address] [place], [place] [address]    Report Status 12/12/2023 FINAL  Final  Culture, blood (Routine X 2) w Reflex to ID Panel     Status: None   Collection Time: 12/07/23 12:50 PM   Specimen: BLOOD  Result Value Ref Range Status   Specimen Description BLOOD LEFT ANTECUBITAL  Final   Special Requests   Final    BOTTLES DRAWN AEROBIC AND ANAEROBIC Blood Culture adequate volume   Culture   Final    NO GROWTH 5 DAYS Performed at [facility], [address] [place], [place] [address]    Report Status 12/12/2023 FINAL  Final    Labs: CBC: Recent Labs  Lab 12/11/23 0336 12/11/23 1155 12/12/23 0050 12/12/23 0420 12/13/23 0610 12/14/23 0508  WBC 10.6*  --  12.3* 12.2* 11.8* 10.4  HGB 7.1* 7.6* 7.7* 7.6* 7.3* 8.8*  HCT 22.2*  --  23.7* 23.9* 23.0* 27.4*  MCV 92.5  --  91.5 91.9 92.7 91.3  PLT 218  --  239 264 279 305   Basic Metabolic Panel: Recent Labs  Lab 12/09/23 0509 12/10/23 1400 12/11/23 0811 12/12/23 0050 12/13/23 0853  NA 132* 127* 132* 134* 131*  K 3.8 3.6 3.7 3.6 3.6  CL 95* 91* 96* 97* 94*  CO2 26 24 27 27 25   GLUCOSE 218* 238* 222* 165* 143*  BUN 41* 51* 28* 18 29*  CREATININE 3.78* 4.46* 2.71* 2.06* 3.06*  CALCIUM  8.1* 7.8* 8.0* 8.0* 8.4*  PHOS  --  5.4* 3.7 2.4* 3.8   Liver Function Tests: Recent Labs  Lab 12/10/23 1400 12/11/23 0811 12/12/23 0050 12/13/23 0853  ALBUMIN 1.7* 1.8* 1.8* 1.9*   CBG: Recent Labs  Lab 12/14/23 0745 12/14/23 1152  12/14/23 1538 12/14/23 2201 12/15/23 0703  GLUCAP 153* 124* 231* 168* 120*    Discharge time spent: .  Signed: Murvin Mana, MD [facility] 12/15/2023

## 2023-12-15 NOTE — Progress Notes (Addendum)
 PHARMACY CONSULT NOTE FOR:  OUTPATIENT  PARENTERAL ANTIBIOTIC THERAPY (OPAT)  UPDATED VERSION - CHANGING ANTIBIOTIC FROM VANCOMYCIN  TO DAPTOMYCIN   Indication: MRSA bacteremia Regimen: Daptomycin  600mg  IV with hemodialysis on Tue/Thur/Sat at dialysis center End date: 01/04/2024 (last dose with HD will be on 7/19)  - Labs - weekly:  CBC/D, CMP, CK, CRP - FAX weekly labs to [phone]   - Baseline CK 6/27 = 21 -Discussed change in antibiotic to daptomycin  with Dr Laurita and Faith, NP with nephrology.    IV antibiotic discharge orders are pended. To discharging provider:  please sign these orders via discharge navigator,  Select New Orders & click on the button choice - Manage This Unsigned Work.     Thank you for allowing pharmacy to be a part of this patient's care.  Maicy Filip, PharmD, BCPS, BCIDP Work Cell: [phone] 12/15/2023 10:25 AM

## 2023-12-15 NOTE — Progress Notes (Signed)
 Physical Therapy Treatment Patient Details Name: Mckenzie Lawrence MRN: [id number] DOB: June 06, 1946 Today's Date: 12/15/2023   History of Present Illness 78 y/o female presented to ED on 11/28/23 for worsening SOB and chest pressure. Admitted for ESRD and acute CHF exacerbation. PMH: CAD, HTN, COPD, OSA, hx of CVA, CKD stage 4, SIADH, T2DM    PT Comments  The patient is cooperative during session. She is requiring increased assistance with all functional mobility this session compared to prior sessions. Activity tolerance is limited by fatigue with minimal activity. The patient will need rehabilitation < 3 hours/day after this hospital stay with ambulance transportation recommended. PT will continue to follow to maximize independence and facilitate return to prior level of function.    If plan is discharge home, recommend the following: A little help with walking and/or transfers;A little help with bathing/dressing/bathroom;Assistance with cooking/housework;Assist for transportation;Help with stairs or ramp for entrance   Can travel by private vehicle     No (patient is unable to ambulate 3-4 steps this session and unable to sit upright more than ~ 5 minutes before needing to return to bed.)  Equipment Recommendations  None recommended by PT    Recommendations for Other Services       Precautions / Restrictions Precautions Precautions: Fall Recall of Precautions/Restrictions: Intact Restrictions Weight Bearing Restrictions Per Provider Order: No     Mobility  Bed Mobility Overal bed mobility: Needs Assistance Bed Mobility: Supine to Sit, Sit to Supine     Supine to sit: Mod assist Sit to supine: Mod assist   General bed mobility comments: assistance for LE and trunk support provided. cues for sequencing and technique    Transfers Overall transfer level: Needs assistance Equipment used: Rolling walker (2 wheels) Transfers: Sit to/from Stand Sit to Stand: Mod assist            General transfer comment: facilitation for anterior weight shifting as patient with posterior lean and relying on the bed for leg support.    Ambulation/Gait Ambulation/Gait assistance: Min assist Gait Distance (Feet): 2 Feet Assistive device: Rolling walker (2 wheels) Gait Pattern/deviations: Step-to pattern Gait velocity: decreased     General Gait Details: ambulation distance is limited by fatigue with minimal activity. steadying assistance provided for safety   Stairs             Wheelchair Mobility     Tilt Bed    Modified Rankin (Stroke Patients Only)       Balance Overall balance assessment: Needs assistance Sitting-balance support: Feet supported Sitting balance-Leahy Scale: Fair Sitting balance - Comments: very close stand by assistance due to fatigue level with minimal activity. sitting tolerance of around 5 minutes   Standing balance support: Bilateral upper extremity supported Standing balance-Leahy Scale: Poor Standing balance comment: patient relying heavily on rolling walker for support with ambulation and heavily on the sink while brushing teeth at the sink                            Communication Communication Communication: No apparent difficulties  Cognition Arousal: Alert Behavior During Therapy: WFL for tasks assessed/performed   PT - Cognitive impairments: No apparent impairments                         Following commands: Intact      Cueing Cueing Techniques: Verbal cues, Tactile cues  Exercises      General Comments General  comments (skin integrity, edema, etc.): standing tolerance limited to around 3 minutes before needing to sit due to fatigue. care plan extended x 2 weeks      Pertinent Vitals/Pain Pain Assessment Pain Assessment: Faces Faces Pain Scale: Hurts little more Pain Location: right leg Pain Descriptors / Indicators: Discomfort Pain Intervention(s): Monitored during session, Limited  activity within patient's tolerance, Repositioned    Home Living                          Prior Function            PT Goals (current goals can now be found in the care plan section) Acute Rehab PT Goals Patient Stated Goal: to feel better PT Goal Formulation: With patient Time For Goal Achievement: 12/29/23 Potential to Achieve Goals: Good Progress towards PT goals: Progressing toward goals    Frequency    Min 2X/week      PT Plan      Co-evaluation              AM-PAC PT 6 Clicks Mobility   Outcome Measure  Help needed turning from your back to your side while in a flat bed without using bedrails?: A Lot Help needed moving from lying on your back to sitting on the side of a flat bed without using bedrails?: A Lot Help needed moving to and from a bed to a chair (including a wheelchair)?: A Lot Help needed standing up from a chair using your arms (e.g., wheelchair or bedside chair)?: A Lot Help needed to walk in hospital room?: A Little Help needed climbing 3-5 steps with a railing? : Total 6 Click Score: 12    End of Session   Activity Tolerance: Patient limited by fatigue Patient left: in bed;with call bell/phone within reach;with family/visitor present   PT Visit Diagnosis: Other abnormalities of gait and mobility (R26.89);Muscle weakness (generalized) (M62.81);Difficulty in walking, not elsewhere classified (R26.2)     Time: 0910-0930 PT Time Calculation (min) (ACUTE ONLY): 20 min  Charges:    $Therapeutic Activity: 8-22 mins PT General Charges $$ ACUTE PT VISIT: 1 Visit                    Mckenzie Lawrence, PT, MPT    Mckenzie Lawrence 12/15/2023, 9:42 AM

## 2023-12-16 DIAGNOSIS — Z992 Dependence on renal dialysis: Secondary | ICD-10-CM | POA: Diagnosis not present

## 2023-12-16 DIAGNOSIS — N2581 Secondary hyperparathyroidism of renal origin: Secondary | ICD-10-CM | POA: Diagnosis not present

## 2023-12-16 DIAGNOSIS — A4902 Methicillin resistant Staphylococcus aureus infection, unspecified site: Secondary | ICD-10-CM | POA: Diagnosis not present

## 2023-12-16 DIAGNOSIS — A499 Bacterial infection, unspecified: Secondary | ICD-10-CM | POA: Diagnosis not present

## 2023-12-16 DIAGNOSIS — N179 Acute kidney failure, unspecified: Secondary | ICD-10-CM | POA: Diagnosis not present

## 2023-12-17 LAB — CULTURE, BLOOD (ROUTINE X 2): Special Requests: ADEQUATE

## 2023-12-18 DIAGNOSIS — N186 End stage renal disease: Secondary | ICD-10-CM | POA: Diagnosis not present

## 2023-12-18 DIAGNOSIS — J449 Chronic obstructive pulmonary disease, unspecified: Secondary | ICD-10-CM | POA: Diagnosis not present

## 2023-12-18 DIAGNOSIS — I480 Paroxysmal atrial fibrillation: Secondary | ICD-10-CM | POA: Diagnosis not present

## 2023-12-18 DIAGNOSIS — I10 Essential (primary) hypertension: Secondary | ICD-10-CM | POA: Diagnosis not present

## 2023-12-19 DIAGNOSIS — R0989 Other specified symptoms and signs involving the circulatory and respiratory systems: Secondary | ICD-10-CM | POA: Diagnosis not present

## 2023-12-19 DIAGNOSIS — J90 Pleural effusion, not elsewhere classified: Secondary | ICD-10-CM | POA: Diagnosis not present

## 2023-12-23 DIAGNOSIS — N179 Acute kidney failure, unspecified: Secondary | ICD-10-CM | POA: Diagnosis not present

## 2023-12-23 DIAGNOSIS — N2581 Secondary hyperparathyroidism of renal origin: Secondary | ICD-10-CM | POA: Diagnosis not present

## 2023-12-23 DIAGNOSIS — L299 Pruritus, unspecified: Secondary | ICD-10-CM | POA: Diagnosis not present

## 2023-12-23 DIAGNOSIS — A499 Bacterial infection, unspecified: Secondary | ICD-10-CM | POA: Diagnosis not present

## 2023-12-23 DIAGNOSIS — Z992 Dependence on renal dialysis: Secondary | ICD-10-CM | POA: Diagnosis not present

## 2023-12-25 DIAGNOSIS — N186 End stage renal disease: Secondary | ICD-10-CM | POA: Diagnosis not present

## 2023-12-25 DIAGNOSIS — I5032 Chronic diastolic (congestive) heart failure: Secondary | ICD-10-CM | POA: Diagnosis not present

## 2023-12-29 ENCOUNTER — Ambulatory Visit: Admitting: Podiatry

## 2023-12-29 DIAGNOSIS — G4700 Insomnia, unspecified: Secondary | ICD-10-CM | POA: Diagnosis not present

## 2023-12-29 DIAGNOSIS — M6259 Muscle wasting and atrophy, not elsewhere classified, multiple sites: Secondary | ICD-10-CM | POA: Diagnosis not present

## 2023-12-30 DIAGNOSIS — L299 Pruritus, unspecified: Secondary | ICD-10-CM | POA: Diagnosis not present

## 2023-12-30 DIAGNOSIS — N179 Acute kidney failure, unspecified: Secondary | ICD-10-CM | POA: Diagnosis not present

## 2023-12-30 DIAGNOSIS — N186 End stage renal disease: Secondary | ICD-10-CM | POA: Diagnosis not present

## 2023-12-30 DIAGNOSIS — A499 Bacterial infection, unspecified: Secondary | ICD-10-CM | POA: Diagnosis not present

## 2023-12-30 DIAGNOSIS — J449 Chronic obstructive pulmonary disease, unspecified: Secondary | ICD-10-CM | POA: Diagnosis not present

## 2023-12-30 DIAGNOSIS — Z992 Dependence on renal dialysis: Secondary | ICD-10-CM | POA: Diagnosis not present

## 2023-12-30 DIAGNOSIS — D72829 Elevated white blood cell count, unspecified: Secondary | ICD-10-CM | POA: Diagnosis not present

## 2023-12-30 DIAGNOSIS — I5032 Chronic diastolic (congestive) heart failure: Secondary | ICD-10-CM | POA: Diagnosis not present

## 2023-12-30 DIAGNOSIS — J90 Pleural effusion, not elsewhere classified: Secondary | ICD-10-CM | POA: Diagnosis not present

## 2024-01-01 DIAGNOSIS — D72829 Elevated white blood cell count, unspecified: Secondary | ICD-10-CM | POA: Diagnosis not present

## 2024-01-01 DIAGNOSIS — L89152 Pressure ulcer of sacral region, stage 2: Secondary | ICD-10-CM | POA: Diagnosis not present

## 2024-01-01 DIAGNOSIS — E119 Type 2 diabetes mellitus without complications: Secondary | ICD-10-CM | POA: Diagnosis not present

## 2024-01-01 DIAGNOSIS — E46 Unspecified protein-calorie malnutrition: Secondary | ICD-10-CM | POA: Diagnosis not present

## 2024-01-01 DIAGNOSIS — N186 End stage renal disease: Secondary | ICD-10-CM | POA: Diagnosis not present

## 2024-01-01 DIAGNOSIS — I5032 Chronic diastolic (congestive) heart failure: Secondary | ICD-10-CM | POA: Diagnosis not present

## 2024-01-02 DIAGNOSIS — I5032 Chronic diastolic (congestive) heart failure: Secondary | ICD-10-CM | POA: Diagnosis not present

## 2024-01-02 DIAGNOSIS — A4902 Methicillin resistant Staphylococcus aureus infection, unspecified site: Secondary | ICD-10-CM | POA: Diagnosis not present

## 2024-01-02 DIAGNOSIS — I480 Paroxysmal atrial fibrillation: Secondary | ICD-10-CM | POA: Diagnosis not present

## 2024-01-02 DIAGNOSIS — N186 End stage renal disease: Secondary | ICD-10-CM | POA: Diagnosis not present

## 2024-01-05 ENCOUNTER — Telehealth (INDEPENDENT_AMBULATORY_CARE_PROVIDER_SITE_OTHER): Payer: Self-pay

## 2024-01-05 NOTE — Telephone Encounter (Signed)
 I received an order for a permcath removal for the patient. Patient was called and a message left for a return call. I then called [facility] and spoke with [facility] and the patient is scheduled with Dr. Jama for a permcath removal at the [facility] with a 3:00 pm arrival time. This will be faxed to beecher Leach at [facility] and she stated she would call the patient's daughter.    [facility], called back and canceled the permcath removal.

## 2024-01-06 ENCOUNTER — Ambulatory Visit: Admit: 2024-01-06 | Admitting: Vascular Surgery

## 2024-01-06 SURGERY — DIALYSIS/PERMA CATHETER REMOVAL
Anesthesia: LOCAL

## 2024-01-12 ENCOUNTER — Ambulatory Visit: Payer: PPO

## 2024-01-16 DEATH — deceased

## 2024-01-19 ENCOUNTER — Ambulatory Visit: Payer: PPO | Admitting: Radiation Oncology

## 2024-01-19 ENCOUNTER — Ambulatory Visit: Payer: PPO | Admitting: Oncology

## 2024-01-26 ENCOUNTER — Ambulatory Visit: Admitting: Oncology

## 2024-01-26 ENCOUNTER — Ambulatory Visit: Admitting: Radiation Oncology
